# Patient Record
Sex: Male | Born: 1945
Health system: Southern US, Community
[De-identification: ages and names within clinical notes are randomized; demographics above are authoritative.]

## PROBLEM LIST (undated history)

## (undated) DIAGNOSIS — I272 Pulmonary hypertension, unspecified: Secondary | ICD-10-CM

## (undated) DIAGNOSIS — E119 Type 2 diabetes mellitus without complications: Secondary | ICD-10-CM

## (undated) DIAGNOSIS — I358 Other nonrheumatic aortic valve disorders: Secondary | ICD-10-CM

## (undated) DIAGNOSIS — I2510 Atherosclerotic heart disease of native coronary artery without angina pectoris: Secondary | ICD-10-CM

## (undated) DIAGNOSIS — I059 Rheumatic mitral valve disease, unspecified: Secondary | ICD-10-CM

## (undated) DIAGNOSIS — I071 Rheumatic tricuspid insufficiency: Secondary | ICD-10-CM

## (undated) DIAGNOSIS — N471 Phimosis: Secondary | ICD-10-CM

## (undated) DIAGNOSIS — E785 Hyperlipidemia, unspecified: Secondary | ICD-10-CM

## (undated) DIAGNOSIS — Z79899 Other long term (current) drug therapy: Secondary | ICD-10-CM

## (undated) DIAGNOSIS — I219 Acute myocardial infarction, unspecified: Secondary | ICD-10-CM

## (undated) DIAGNOSIS — K219 Gastro-esophageal reflux disease without esophagitis: Secondary | ICD-10-CM

## (undated) DIAGNOSIS — M1990 Unspecified osteoarthritis, unspecified site: Secondary | ICD-10-CM

## (undated) DIAGNOSIS — J449 Chronic obstructive pulmonary disease, unspecified: Secondary | ICD-10-CM

## (undated) DIAGNOSIS — Z8546 Personal history of malignant neoplasm of prostate: Secondary | ICD-10-CM

## (undated) DIAGNOSIS — C61 Malignant neoplasm of prostate: Secondary | ICD-10-CM

## (undated) DIAGNOSIS — F5101 Primary insomnia: Secondary | ICD-10-CM

## (undated) DIAGNOSIS — J189 Pneumonia, unspecified organism: Secondary | ICD-10-CM

## (undated) DIAGNOSIS — Z8679 Personal history of other diseases of the circulatory system: Secondary | ICD-10-CM

## (undated) DIAGNOSIS — G5600 Carpal tunnel syndrome, unspecified upper limb: Secondary | ICD-10-CM

## (undated) DIAGNOSIS — G4733 Obstructive sleep apnea (adult) (pediatric): Secondary | ICD-10-CM

## (undated) DIAGNOSIS — I82409 Acute embolism and thrombosis of unspecified deep veins of unspecified lower extremity: Secondary | ICD-10-CM

## (undated) DIAGNOSIS — Z951 Presence of aortocoronary bypass graft: Secondary | ICD-10-CM

## (undated) DIAGNOSIS — I517 Cardiomegaly: Secondary | ICD-10-CM

## (undated) DIAGNOSIS — D649 Anemia, unspecified: Secondary | ICD-10-CM

## (undated) DIAGNOSIS — E039 Hypothyroidism, unspecified: Secondary | ICD-10-CM

## (undated) DIAGNOSIS — G8929 Other chronic pain: Secondary | ICD-10-CM

## (undated) DIAGNOSIS — I10 Essential (primary) hypertension: Secondary | ICD-10-CM

## (undated) DIAGNOSIS — Z8619 Personal history of other infectious and parasitic diseases: Secondary | ICD-10-CM

## (undated) DIAGNOSIS — M25571 Pain in right ankle and joints of right foot: Secondary | ICD-10-CM

## (undated) DIAGNOSIS — I739 Peripheral vascular disease, unspecified: Secondary | ICD-10-CM

## (undated) DIAGNOSIS — E7800 Pure hypercholesterolemia, unspecified: Secondary | ICD-10-CM

## (undated) DIAGNOSIS — J810 Acute pulmonary edema: Secondary | ICD-10-CM

## (undated) DIAGNOSIS — I4891 Unspecified atrial fibrillation: Secondary | ICD-10-CM

## (undated) DIAGNOSIS — C189 Malignant neoplasm of colon, unspecified: Secondary | ICD-10-CM

## (undated) DIAGNOSIS — Z9289 Personal history of other medical treatment: Secondary | ICD-10-CM

## (undated) DIAGNOSIS — F17200 Nicotine dependence, unspecified, uncomplicated: Secondary | ICD-10-CM

## (undated) DIAGNOSIS — IMO0002 Reserved for concepts with insufficient information to code with codable children: Secondary | ICD-10-CM

## (undated) HISTORY — DX: Morbid (severe) obesity due to excess calories: E66.01

## (undated) HISTORY — DX: Obstructive sleep apnea (adult) (pediatric): G47.33

## (undated) HISTORY — DX: Pure hypercholesterolemia, unspecified: E78.00

## (undated) HISTORY — PX: PTCA: SHX146

## (undated) HISTORY — DX: Reserved for concepts with insufficient information to code with codable children: IMO0002

## (undated) HISTORY — DX: Nicotine dependence, unspecified, uncomplicated: F17.200

## (undated) HISTORY — DX: Hypothyroidism, unspecified: E03.9

## (undated) HISTORY — DX: Personal history of malignant neoplasm of prostate: Z85.46

## (undated) HISTORY — DX: Atherosclerotic heart disease of native coronary artery without angina pectoris: I25.10

## (undated) HISTORY — DX: Hyperlipidemia, unspecified: E78.5

## (undated) HISTORY — DX: Unspecified atrial fibrillation: I48.91

## (undated) HISTORY — DX: Malignant neoplasm of prostate: C61

## (undated) HISTORY — DX: Presence of aortocoronary bypass graft: Z95.1

## (undated) HISTORY — DX: Pain in right ankle and joints of right foot: M25.571

## (undated) HISTORY — DX: Primary insomnia: F51.01

## (undated) HISTORY — DX: Acute myocardial infarction, unspecified: I21.9

## (undated) HISTORY — DX: Essential (primary) hypertension: I10

## (undated) HISTORY — DX: Other long term (current) drug therapy: Z79.899

## (undated) HISTORY — DX: Other chronic pain: G89.29

## (undated) HISTORY — DX: Peripheral vascular disease, unspecified: I73.9

---
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
## 2004-12-03 DIAGNOSIS — Z951 Presence of aortocoronary bypass graft: Secondary | ICD-10-CM

## 2004-12-03 DIAGNOSIS — C189 Malignant neoplasm of colon, unspecified: Secondary | ICD-10-CM

## 2004-12-03 HISTORY — DX: Presence of aortocoronary bypass graft: Z95.1

## 2004-12-03 HISTORY — DX: Malignant neoplasm of colon, unspecified: C18.9

## 2004-12-03 HISTORY — PX: CORONARY ARTERY BYPASS GRAFT: SHX141

## 2006-12-03 DIAGNOSIS — C61 Malignant neoplasm of prostate: Secondary | ICD-10-CM

## 2006-12-03 HISTORY — DX: Malignant neoplasm of prostate: C61

## 2006-12-03 HISTORY — PX: PROSTATE SURGERY: SHX751

## 2013-12-03 HISTORY — PX: ANKLE SURGERY: SHX546

## 2015-10-24 DIAGNOSIS — S8251XA Displaced fracture of medial malleolus of right tibia, initial encounter for closed fracture: Secondary | ICD-10-CM | POA: Insufficient documentation

## 2015-10-31 ENCOUNTER — Other Ambulatory Visit: Payer: Self-pay | Admitting: Orthopaedic Surgery

## 2015-10-31 DIAGNOSIS — M79671 Pain in right foot: Secondary | ICD-10-CM

## 2015-11-07 DIAGNOSIS — M19171 Post-traumatic osteoarthritis, right ankle and foot: Secondary | ICD-10-CM | POA: Insufficient documentation

## 2015-11-08 ENCOUNTER — Other Ambulatory Visit: Payer: Self-pay

## 2016-02-29 ENCOUNTER — Telehealth: Payer: Self-pay | Admitting: Internal Medicine

## 2016-02-29 NOTE — Telephone Encounter (Signed)
Received records from [place] for appointment on 03/06/16 with Dr Debara Pickett.  Records given to [facility] (medical records) for Dr Lysbeth Penner schedule on 03/06/16. lp

## 2016-03-06 ENCOUNTER — Ambulatory Visit: Payer: Self-pay | Admitting: Internal Medicine

## 2016-03-21 DIAGNOSIS — M25571 Pain in right ankle and joints of right foot: Secondary | ICD-10-CM | POA: Insufficient documentation

## 2016-03-21 DIAGNOSIS — I471 Supraventricular tachycardia: Secondary | ICD-10-CM | POA: Insufficient documentation

## 2016-03-21 DIAGNOSIS — E7800 Pure hypercholesterolemia, unspecified: Secondary | ICD-10-CM

## 2016-03-21 DIAGNOSIS — Z8546 Personal history of malignant neoplasm of prostate: Secondary | ICD-10-CM

## 2016-03-21 DIAGNOSIS — G8929 Other chronic pain: Secondary | ICD-10-CM

## 2016-03-21 DIAGNOSIS — Z79899 Other long term (current) drug therapy: Secondary | ICD-10-CM

## 2016-03-21 DIAGNOSIS — I10 Essential (primary) hypertension: Secondary | ICD-10-CM | POA: Insufficient documentation

## 2016-03-21 DIAGNOSIS — IMO0002 Reserved for concepts with insufficient information to code with codable children: Secondary | ICD-10-CM | POA: Insufficient documentation

## 2016-03-21 DIAGNOSIS — E785 Hyperlipidemia, unspecified: Secondary | ICD-10-CM | POA: Insufficient documentation

## 2016-03-21 DIAGNOSIS — G4733 Obstructive sleep apnea (adult) (pediatric): Secondary | ICD-10-CM | POA: Insufficient documentation

## 2016-03-21 DIAGNOSIS — F17200 Nicotine dependence, unspecified, uncomplicated: Secondary | ICD-10-CM | POA: Insufficient documentation

## 2016-03-21 DIAGNOSIS — Z951 Presence of aortocoronary bypass graft: Secondary | ICD-10-CM | POA: Insufficient documentation

## 2016-03-21 DIAGNOSIS — F5101 Primary insomnia: Secondary | ICD-10-CM

## 2016-03-21 HISTORY — DX: Primary insomnia: F51.01

## 2016-03-21 HISTORY — DX: Pain in right ankle and joints of right foot: M25.571

## 2016-03-21 HISTORY — DX: Essential (primary) hypertension: I10

## 2016-03-21 HISTORY — DX: Other long term (current) drug therapy: Z79.899

## 2016-03-21 HISTORY — DX: Reserved for concepts with insufficient information to code with codable children: IMO0002

## 2016-03-21 HISTORY — DX: Obstructive sleep apnea (adult) (pediatric): G47.33

## 2016-03-21 HISTORY — DX: Pure hypercholesterolemia, unspecified: E78.00

## 2016-03-21 HISTORY — DX: Morbid (severe) obesity due to excess calories: E66.01

## 2016-03-21 HISTORY — DX: Personal history of malignant neoplasm of prostate: Z85.46

## 2016-03-21 HISTORY — DX: Other chronic pain: G89.29

## 2016-03-21 HISTORY — DX: Nicotine dependence, unspecified, uncomplicated: F17.200

## 2016-03-22 ENCOUNTER — Ambulatory Visit: Payer: Federal, State, Local not specified - PPO | Admitting: Internal Medicine

## 2016-04-13 ENCOUNTER — Ambulatory Visit (INDEPENDENT_AMBULATORY_CARE_PROVIDER_SITE_OTHER): Payer: Federal, State, Local not specified - PPO | Admitting: Internal Medicine

## 2016-04-13 ENCOUNTER — Encounter: Payer: Self-pay | Admitting: Internal Medicine

## 2016-04-13 VITALS — BP 132/64 | HR 82 | Ht 67.0 in | Wt 239.0 lb

## 2016-04-13 DIAGNOSIS — I10 Essential (primary) hypertension: Secondary | ICD-10-CM | POA: Diagnosis not present

## 2016-04-13 DIAGNOSIS — I471 Supraventricular tachycardia: Secondary | ICD-10-CM

## 2016-04-13 DIAGNOSIS — E7800 Pure hypercholesterolemia, unspecified: Secondary | ICD-10-CM

## 2016-04-13 DIAGNOSIS — Z951 Presence of aortocoronary bypass graft: Secondary | ICD-10-CM

## 2016-04-13 DIAGNOSIS — F17200 Nicotine dependence, unspecified, uncomplicated: Secondary | ICD-10-CM | POA: Diagnosis not present

## 2016-04-13 DIAGNOSIS — G4733 Obstructive sleep apnea (adult) (pediatric): Secondary | ICD-10-CM

## 2016-04-13 NOTE — Progress Notes (Signed)
OFFICE NOTE  Chief Complaint:  Establish cardiologist  Primary Care Physician: No primary care provider on file.  HPI:  Jeremiah [name] is a 70 y.o. 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 He is originally from [place] and his wife who is accompanying him today is originally from [place]. Jeremiah [name] had an MI in 2006 and underwent coronary artery bypass grafting with a LIMA to LAD, SVG to OM1, and SVG to RPDA. He did fairly well with this however 2015 was having chest discomfort. He underwent a nuclear stress test which showed possible reversible ischemia. Therefore he was referred for cardiac catheterization at [facility] [place]. That demonstrated all 3 patent bypass grafts with multivessel coronary disease. At that time he was also having episodes of palpitations and a monitor demonstrated PSVT. He was scheduled for possible ablation but is had chronic problems with an ankle fracture and nonhealing wound. He therefore never underwent ablation. He was placed on digoxin and has been on metoprolol but stopped the digoxin at some point in the past. He reports over the past 8-12 months she's had no further palpitations. He also has a history of dyslipidemia, hypothyroidism, and borderline diabetes. Recently he had a sleep study through [facility] which demonstrated severe obstructive sleep apnea and BiPAP was recommended with settings of 19/14 cm water pressure. Currently he denies any chest pain or worsening shortness of breath. His last echocardiogram from his cardiologist in [place] was a 2015 which showed an EF of 58% and no significant valvular disease.  PMHx:  Past Medical History  Diagnosis Date  . HLD (hyperlipidemia)   . Hypothyroidism   . HTN (hypertension)   . A-fib ([place])   . CAD (coronary artery disease)   . Hx of CABG 2006  . Prostate cancer ([place]) 2008  . Myocardial infarct ([place])   . Morbid obesity ([place]) 03/21/2016  . Essential  hypertension 03/21/2016  . Hypercholesteremia 03/21/2016  . Paronychia 03/21/2016  . Tobacco dependence 03/21/2016  . Pain in right ankle and joints of right foot 03/21/2016  . Chronic pain 03/21/2016  . OSA (obstructive sleep apnea) 03/21/2016  . Medication management 03/21/2016  . History of prostate cancer 03/21/2016  . Primary insomnia 03/21/2016    Past Surgical History  Procedure Laterality Date  . Coronary artery bypass graft  2006  . Ankle surgery  12/2013  . Prostate surgery  2008    FAMHx:  Family History  Problem Relation Age of Onset  . Dementia Mother   . Diabetes Sister     SOCHx:   reports that he has been smoking Cigarettes.  He has been smoking about 0.50 packs per day. He does not have any smokeless tobacco history on file. He reports that he drinks alcohol. His drug history is not on file.  ALLERGIES:  No Known Allergies  ROS: Pertinent items noted in HPI and remainder of comprehensive ROS otherwise negative.  HOME MEDS: Current Outpatient Prescriptions  Medication Sig Dispense Refill  . ASPIRIN LOW DOSE 81 MG EC tablet TK 1 T PO ONCE A DAY  9  . atorvastatin (LIPITOR) 80 MG tablet TK 1 T PO QD  2  . cephALEXin (KEFLEX) 500 MG capsule TK 1 C PO TID FOR 7 DAYS  0  . levothyroxine (SYNTHROID, LEVOTHROID) 75 MCG tablet TK 1 T PO QD ON AN EMPTY STOMACH FOR THYROID  1  . meloxicam (MOBIC) 7.5 MG tablet TK 1  T PO  BID  0  . metoprolol (LOPRESSOR) 100 MG tablet TK 1 T PO BID  3  . traZODone (DESYREL) 50 MG tablet TK 1 T PO QHS PRF SLP  1   No current facility-administered medications for this visit.    LABS/IMAGING: No results found for this or any previous visit (from the past 48 hour(s)). No results found.  WEIGHTS: Wt Readings from Last 3 Encounters:  04/13/16 239 lb (108.41 kg)    VITALS: BP 132/64 mmHg  Pulse 82  Ht 5\' 7"  (1.702 m)  Wt 239 lb (108.41 kg)  BMI 37.42 kg/m2  EXAM: General appearance: alert and no distress Neck: no carotid bruit  and no JVD Lungs: clear to auscultation bilaterally Heart: regular rate and rhythm, S1, S2 normal, no murmur, click, rub or gallop Abdomen: soft, non-tender; bowel sounds normal; no masses,  no organomegaly Extremities: extremities normal, atraumatic, no cyanosis or edema Pulses: 2+ and symmetric Skin: Skin color, texture, turgor normal. No rashes or lesions Neurologic: Grossly normal Psych: Pleasant  EKG: Sinus rhythm with PACs at 82, possible left atrial enlargement, ST and T waves are abnormal, possible inferior infarct  ASSESSMENT: 1. CAD status post three-vessel CABG in 2006 (LIMA to LAD, SVG to OM1, SVG to RPDA) in [place] 2. Patent bypass grafts by cath in 2015 3. History of PSVT-improve with digoxin, but no longer taking-on metoprolol 4. Dyslipidemia on Lipitor 5. OSA-BiPAP recommended  PLAN: 1.   Jeremiah [name] has a history of multivessel coronary disease with bypass in 2006. Fortunately he had but patent bypass grafts in 2015. He is on reasonable therapy including aspirin, statin and metoprolol. He's not currently on ACE or ARB which may be added in the future as blood pressure tolerates. I would try to avoid digoxin for his history of SVT because of the negative side effects of that medication. It also has a very narrow therapeutic window. He seems to have had no further recurrence on metoprolol. It does not seem that he would need ablation as he was previously contemplating. Some of his arrhythmias may improve with treatment of his severe obstructive sleep apnea. BiPAP has been recommended but he is not yet been fitted with equipment. He will need continuing follow-up of dyslipidemia. He is on Lipitor. Recent labs from [place] at [facility] indicated good control over her cholesterol with a total cholesterol of 146, triglycerides 304, HL 31 and LDL 55. Triglycerides as he knows is related to elevated sugars and carbohydrates in his diet which she should try to work on as well as weight  loss and more exercise. Plan to see him back in about 3 months.  Thanks for the kind referral.  Pixie Casino, MD, [facility] Attending Cardiologist [place] 04/13/2016, 6:31 PM

## 2016-04-13 NOTE — Patient Instructions (Signed)
Your physician recommends that you schedule a follow-up appointment in THREE MONTHS with Dr. Hilty.  

## 2016-04-16 ENCOUNTER — Encounter: Payer: Self-pay | Admitting: *Deleted

## 2016-04-16 ENCOUNTER — Telehealth: Payer: Self-pay | Admitting: Internal Medicine

## 2016-04-16 NOTE — Telephone Encounter (Signed)
Returned call to patient.   Patient would like [place] to go ahead and arrange his CPAP supplies/management Patient has his sleep study thru [facility]  Patient's info has been submitted by PCP to the home care agencies below: [facility] patient I am personally unsure how to process a CPAP supply referral since another MD/office ordered the study and his info has been submitted to home care/DME agencies already. Informed him I have reached out for advice on this

## 2016-04-16 NOTE — Telephone Encounter (Signed)
NEw Message  Pt requested to speak w/ RN concerning CPAP machine. Please call back and discuss.

## 2016-04-17 NOTE — Telephone Encounter (Signed)
Message routed to [place] to assist

## 2016-04-18 NOTE — Telephone Encounter (Signed)
Left message for medical assistant for the patient's PCP to return a call to me to discuss the sleep study.

## 2016-04-23 ENCOUNTER — Telehealth: Payer: Self-pay | Admitting: Cardiovascular Disease

## 2016-04-23 NOTE — Telephone Encounter (Signed)
Please call today,this concerning getting his sleep equipment.

## 2016-05-05 ENCOUNTER — Emergency Department (HOSPITAL_COMMUNITY): Payer: Federal, State, Local not specified - PPO

## 2016-05-05 ENCOUNTER — Inpatient Hospital Stay (HOSPITAL_COMMUNITY)
Admission: EM | Admit: 2016-05-05 | Discharge: 2016-05-05 | DRG: 282 | Disposition: A | Discharge status: Home / Self Care | Payer: Federal, State, Local not specified - PPO | Attending: Cardiology | Admitting: Cardiology

## 2016-05-05 ENCOUNTER — Encounter (HOSPITAL_COMMUNITY): Payer: Self-pay | Admitting: Emergency Medicine

## 2016-05-05 DIAGNOSIS — G4700 Insomnia, unspecified: Secondary | ICD-10-CM | POA: Diagnosis present

## 2016-05-05 DIAGNOSIS — I2510 Atherosclerotic heart disease of native coronary artery without angina pectoris: Secondary | ICD-10-CM | POA: Diagnosis present

## 2016-05-05 DIAGNOSIS — F17210 Nicotine dependence, cigarettes, uncomplicated: Secondary | ICD-10-CM | POA: Diagnosis present

## 2016-05-05 DIAGNOSIS — I209 Angina pectoris, unspecified: Secondary | ICD-10-CM | POA: Diagnosis not present

## 2016-05-05 DIAGNOSIS — R079 Chest pain, unspecified: Secondary | ICD-10-CM | POA: Diagnosis not present

## 2016-05-05 DIAGNOSIS — Z6837 Body mass index (BMI) 37.0-37.9, adult: Secondary | ICD-10-CM | POA: Diagnosis not present

## 2016-05-05 DIAGNOSIS — R7303 Prediabetes: Secondary | ICD-10-CM | POA: Diagnosis present

## 2016-05-05 DIAGNOSIS — I4891 Unspecified atrial fibrillation: Secondary | ICD-10-CM | POA: Diagnosis present

## 2016-05-05 DIAGNOSIS — I214 Non-ST elevation (NSTEMI) myocardial infarction: Secondary | ICD-10-CM | POA: Diagnosis not present

## 2016-05-05 DIAGNOSIS — I10 Essential (primary) hypertension: Secondary | ICD-10-CM | POA: Diagnosis present

## 2016-05-05 DIAGNOSIS — Z7982 Long term (current) use of aspirin: Secondary | ICD-10-CM

## 2016-05-05 DIAGNOSIS — Z79899 Other long term (current) drug therapy: Secondary | ICD-10-CM

## 2016-05-05 DIAGNOSIS — Z951 Presence of aortocoronary bypass graft: Secondary | ICD-10-CM | POA: Diagnosis not present

## 2016-05-05 DIAGNOSIS — G4733 Obstructive sleep apnea (adult) (pediatric): Secondary | ICD-10-CM | POA: Diagnosis present

## 2016-05-05 DIAGNOSIS — E7800 Pure hypercholesterolemia, unspecified: Secondary | ICD-10-CM | POA: Diagnosis present

## 2016-05-05 DIAGNOSIS — G8929 Other chronic pain: Secondary | ICD-10-CM | POA: Diagnosis present

## 2016-05-05 DIAGNOSIS — E785 Hyperlipidemia, unspecified: Secondary | ICD-10-CM | POA: Diagnosis present

## 2016-05-05 DIAGNOSIS — I252 Old myocardial infarction: Secondary | ICD-10-CM | POA: Diagnosis not present

## 2016-05-05 DIAGNOSIS — Z8546 Personal history of malignant neoplasm of prostate: Secondary | ICD-10-CM | POA: Diagnosis not present

## 2016-05-05 DIAGNOSIS — E039 Hypothyroidism, unspecified: Secondary | ICD-10-CM | POA: Diagnosis present

## 2016-05-05 DIAGNOSIS — F17200 Nicotine dependence, unspecified, uncomplicated: Secondary | ICD-10-CM | POA: Diagnosis present

## 2016-05-05 LAB — CBC
HCT: 44.6 % (ref 39.0–52.0)
Hemoglobin: 14.3 g/dL (ref 13.0–17.0)
MCH: 31.6 pg (ref 26.0–34.0)
MCHC: 32.1 g/dL (ref 30.0–36.0)
MCV: 98.5 fL (ref 78.0–100.0)
PLATELETS: 204 10*3/uL (ref 150–400)
RBC: 4.53 MIL/uL (ref 4.22–5.81)
RDW: 15.3 % (ref 11.5–15.5)
WBC: 6.4 10*3/uL (ref 4.0–10.5)

## 2016-05-05 LAB — BRAIN NATRIURETIC PEPTIDE: B Natriuretic Peptide: 90.3 pg/mL (ref 0.0–100.0)

## 2016-05-05 LAB — I-STAT CHEM 8, ED
BUN: 21 mg/dL — ABNORMAL HIGH (ref 6–20)
Calcium, Ion: 1.13 mmol/L (ref 1.13–1.30)
Chloride: 98 mmol/L — ABNORMAL LOW (ref 101–111)
Creatinine, Ser: 1.1 mg/dL (ref 0.61–1.24)
Glucose, Bld: 198 mg/dL — ABNORMAL HIGH (ref 65–99)
HEMATOCRIT: 47 % (ref 39.0–52.0)
HEMOGLOBIN: 16 g/dL (ref 13.0–17.0)
POTASSIUM: 3.4 mmol/L — AB (ref 3.5–5.1)
SODIUM: 138 mmol/L (ref 135–145)
TCO2: 26 mmol/L (ref 0–100)

## 2016-05-05 LAB — COMPREHENSIVE METABOLIC PANEL WITH GFR
ALT: 21 U/L (ref 17–63)
AST: 21 U/L (ref 15–41)
Albumin: 3.8 g/dL (ref 3.5–5.0)
Alkaline Phosphatase: 82 U/L (ref 38–126)
Anion gap: 13 (ref 5–15)
BUN: 19 mg/dL (ref 6–20)
CO2: 25 mmol/L (ref 22–32)
Calcium: 9.2 mg/dL (ref 8.9–10.3)
Chloride: 97 mmol/L — ABNORMAL LOW (ref 101–111)
Creatinine, Ser: 1.19 mg/dL (ref 0.61–1.24)
GFR calc Af Amer: >60 mL/min
GFR calc non Af Amer: >60 mL/min
Glucose, Bld: 201 mg/dL — ABNORMAL HIGH (ref 65–99)
Potassium: 3.5 mmol/L (ref 3.5–5.1)
Sodium: 135 mmol/L (ref 135–145)
Total Bilirubin: 0.5 mg/dL (ref 0.3–1.2)
Total Protein: 6.6 g/dL (ref 6.5–8.1)

## 2016-05-05 LAB — TROPONIN I: Troponin I: 0.12 ng/mL — ABNORMAL HIGH (ref <0.031)

## 2016-05-05 LAB — I-STAT TROPONIN, ED: TROPONIN I, POC: 0.01 ng/mL (ref 0.00–0.08)

## 2016-05-05 MED ORDER — ASPIRIN 325 MG PO TABS
325.0000 mg | ORAL_TABLET | Freq: Once | ORAL | Status: AC
  Administered 2016-05-05: 325 mg via ORAL
  Filled 2016-05-05: qty 1

## 2016-05-05 MED ORDER — HEPARIN BOLUS VIA INFUSION
4000.0000 [IU] | Freq: Once | INTRAVENOUS | Status: DC
  Filled 2016-05-05: qty 4000

## 2016-05-05 MED ORDER — CLOPIDOGREL BISULFATE 300 MG PO TABS: 600.0000 mg | ORAL_TABLET | Freq: Once | ORAL | Status: DC

## 2016-05-05 MED ORDER — ASPIRIN EC 325 MG PO TBEC
325.0000 mg | DELAYED_RELEASE_TABLET | Freq: Every day | ORAL | Status: DC
Start: 2016-05-05 — End: 2016-08-02

## 2016-05-05 MED ORDER — CLOPIDOGREL BISULFATE 75 MG PO TABS
75.0000 mg | ORAL_TABLET | Freq: Every day | ORAL | Status: DC
Start: 2016-05-06 — End: 2016-07-26

## 2016-05-05 MED ORDER — HEPARIN (PORCINE) IN NACL 100-0.45 UNIT/ML-% IJ SOLN
1200.0000 [IU]/h | INTRAMUSCULAR | Status: DC
  Filled 2016-05-05: qty 250

## 2016-05-05 NOTE — H&P (Signed)
History & Physical    Patient ID: Wwilliam [name] MRN: [id number], DOB/AGE: 03/20/1946   Admit date: 05/05/2016   Primary Physician: [name], MD Primary Cardiologist: Dr. Debara Pickett  History of Present Illness    Jeremiah [name] is a 70 y.o. male with past medical history of CAD (s/p CABG in 2006 with LIMA-LAD, SVG-OM1, and SVG-RPDA), PSVT, Dyslipidemia, hypothyroidism, prediabetes, and OSA (on BiPAP) who presents to [facility] ED on 05/05/2016 for evaluation of chest pain.  The patient reports he developed a central chest pain radiating down his left arm earlier this morning while sitting at his dining room table. Says the pain felt as if someone was repeatedly hitting his chest with a hammer. He developed associated diaphoresis along with nausea and vomiting 10 minutes later. He took a SL NTG with no relief in his symptoms. Overall, the pain lasted approximately 20 minutes. He denies any active pain at this time.  Says the pain he experienced earlier today feels identical to his symptoms in 2006 when he had an MI and required CABG. Says he use to have episodes of PSVT which would feel similar as well but he never had N/V with those episodes. Only had N/V with his prior MI.  Denies any recent chest discomfort or dyspnea with exertion over the past several weeks. Says he has been feeling well. Reports good compliance with his cardiac medications (ASA, statin, and Lopressor).  His last cardiac catheterization was in 2015 and showed all 3 grafts were patent. Has required no further ischemic evaluation since. In reviewing notes from Dr. Debara Pickett, his last echocardiogram was in 2015 and showed a preserved EF of 58%.  While in the ED, his BMET shows a creatinine of 1.19 and glucose elevated to 201. CBC with no significant abnormalities.  BNP 90.3. Initial troponin at 0.01. EKG shows NSR, HR 70, ST depression in the inferior leads and TWI in lateral leads, both present on previous tracings.  Past Medical  History    Past Medical History  Diagnosis Date  . HLD (hyperlipidemia)   . Hypothyroidism   . HTN (hypertension)   . A-fib ([place])   . CAD (coronary artery disease)   . Hx of CABG 2006  . Prostate cancer ([place]) 2008  . Myocardial infarct ([place])   . Morbid obesity ([place]) 03/21/2016  . Essential hypertension 03/21/2016  . Hypercholesteremia 03/21/2016  . Paronychia 03/21/2016  . Tobacco dependence 03/21/2016  . Pain in right ankle and joints of right foot 03/21/2016  . Chronic pain 03/21/2016  . OSA (obstructive sleep apnea) 03/21/2016  . Medication management 03/21/2016  . History of prostate cancer 03/21/2016  . Primary insomnia 03/21/2016    Past Surgical History  Procedure Laterality Date  . Coronary artery bypass graft  2006    x3  . Ankle surgery  12/2013  . Prostate surgery  2008     Allergies  No Known Allergies   Home Medications    Prior to Admission medications   Medication Sig Start Date End Date Taking? Authorizing Provider  aspirin EC 81 MG tablet Take 81 mg by mouth daily.   Yes Historical Provider, MD  atorvastatin (LIPITOR) 80 MG tablet Take 80 mg by mouth daily.   Yes Historical Provider, MD  levothyroxine (SYNTHROID, LEVOTHROID) 75 MCG tablet Take 75 mcg by mouth daily before breakfast.   Yes Historical Provider, MD  meloxicam (MOBIC) 7.5 MG tablet Take 7.5 mg by mouth daily.   Yes Historical Provider, MD  metoprolol (  LOPRESSOR) 100 MG tablet Take 100 mg by mouth 2 (two) times daily.   Yes Historical Provider, MD  traZODone (DESYREL) 50 MG tablet Take 50 mg by mouth at bedtime as needed for sleep.   Yes Historical Provider, MD    Family History    Family History  Problem Relation Age of Onset  . Dementia Mother   . Diabetes Sister     Social History    Social History   Social History  . Marital Status: Married    Spouse Name: N/A  . Number of Children: 0  . Years of Education: N/A   Occupational History  . retired - Passenger transport manager    Social  History Main Topics  . Smoking status: Current Every Day Smoker -- 0.50 packs/day    Types: Cigarettes  . Smokeless tobacco: Not on file  . Alcohol Use: Yes     Comment: 1 beer per month  . Drug Use: Not on file  . Sexual Activity: Not on file   Other Topics Concern  . Not on file   Social History Narrative   Retired, married, no children      epworth sleepiness scale = 9 (04/13/16) (has OSA)     Review of Systems    General:  No chills, fever, night sweats or weight changes.  Cardiovascular:  No dyspnea on exertion, edema, orthopnea, palpitations, paroxysmal nocturnal dyspnea. Positive for chest pain and diaphoresis.  Dermatological: No rash, lesions/masses Respiratory: No cough, dyspnea Urologic: No hematuria, dysuria Abdominal:   No diarrhea, bright red blood per rectum, melena, or hematemesis. Positive for nausea and vomiting. Neurologic:  No visual changes, wkns, changes in mental status. All other systems reviewed and are otherwise negative except as noted above.  Physical Exam    Blood pressure 96/57, pulse 60, temperature 97.8 F (36.6 C), temperature source Oral, resp. rate 16, SpO2 98 %.  General: Obese Caucasian ,male appearing in no acute distress. Head: Normocephalic, atraumatic, sclera non-icteric, no xanthomas, nares are without discharge. Dentition:  Neck: No carotid bruits. JVD not elevated.  Lungs: Respirations regular and unlabored, rhonchi present in lower lung fields. No wheezing or rales noted. Heart: Regular rate and rhythm. No S3 or S4.  No murmur, no rubs, or gallops appreciated. Abdomen: Soft, non-tender, non-distended with normoactive bowel sounds. No hepatomegaly. No rebound/guarding. No obvious abdominal masses. Msk:  Strength and tone appear normal for age. No joint deformities or effusions. Extremities: No clubbing or cyanosis. No edema.  Distal pedal pulses are 2+ bilaterally. Neuro: Alert and oriented X 3. Moves all extremities spontaneously.  No focal deficits noted. Psych:  Responds to questions appropriately with a normal affect. Skin: No rashes or lesions noted  Labs    Troponin  [facility] Test)  Recent Labs  05/05/16 0613  TROPIPOC 0.01   No results for input(s): CKTOTAL, CKMB, TROPONINI in the last 72 hours. Lab Results  Component Value Date   WBC 6.4 05/05/2016   HGB 16.0 05/05/2016   HCT 47.0 05/05/2016   MCV 98.5 05/05/2016   PLT 204 05/05/2016     Recent Labs Lab 05/05/16 0603 05/05/16 0615  NA 135 138  K 3.5 3.4*  CL 97* 98*  CO2 25  --   BUN 19 21*  CREATININE 1.19 1.10  CALCIUM 9.2  --   PROT 6.6  --   BILITOT 0.5  --   ALKPHOS 82  --   ALT 21  --   AST 21  --  GLUCOSE 201* 198*   No results found for: CHOL, HDL, LDLCALC, TRIG No results found for: DDIMER   B NATRIURETIC PEPTIDE  Date/Time Value Ref Range Status  05/05/2016 06:03 AM 90.3 0.0 - 100.0 pg/mL Final    Radiology Studies    Dg Chest 2 View: 05/05/2016  CLINICAL DATA:  Initial evaluation for acute chest pain. EXAM: CHEST  2 VIEW COMPARISON:  None. FINDINGS: Median sternotomy wires underlying surgical clips present. Mild cardiomegaly. Mediastinal silhouette within normal limits. Lungs normally inflated. No focal infiltrate, pulmonary edema, or definite pleural effusion. No pneumothorax. No acute osseous abnormality. IMPRESSION: No active cardiopulmonary disease. Electronically Signed   By: Jeannine Boga M.D.   On: 05/05/2016 06:39    EKG & Cardiac Imaging    EKG: NSR, HR 70, ST depression in the inferior leads and TWI in lateral leads, both present on previous tracings  Assessment & Plan    1. Chest Pain concerning for Unstable Angina/ History of CAD - s/p CABG in 2006 with LIMA-LAD, SVG-OM1, and SVG-RPDA. Presents with chest pain, N, V, and diaphoresis, all consistent with his previous MI.  - last cardiac catheterization was in 2015 and showed all 3 grafts were patent. Has required no further ischemic evaluation  since due to no symptoms until now. -  EKG shows ST depression in the inferior leads and TWI in lateral leads, both present on previous tracings. Initial troponin negative. Will cycle troponin values. - Discussed with Dr. Radford Pax. Will start on IV Heparin. Add to the schedule for a repeat cardiac catheterization on Monday. - continue ASA, statin, and BB.  2. History of PSVT - denies any recent occurrence. - monitor on telemetry.  3. Dyslipidemia - continue statin therapy.  4. OSA - on BiPAP  5. Tobacco Use  - smoking cessation advised.  6. Prediabetes - started on Metformin as an outpatient. Hold in anticipation of cardiac cath. - SSI while admitted. - A1c pending.   Signed, Erma Heritage, PA-C 05/05/2016, 9:05 AM Pager: [phone]

## 2016-05-05 NOTE — ED Notes (Addendum)
Patient awoke this morning with chest pain and shortness of breath around 0400, having nausea and vomited x1 after an episode of sweating.  Patient denies any CP at this time.

## 2016-05-05 NOTE — ED Provider Notes (Signed)
CSN: [id number]     Arrival date & time 05/05/16  0543 History   First MD Initiated Contact with Patient 05/05/16 [phone]     Chief Complaint  Patient presents with  . Chest Pain     (Consider location/radiation/quality/duration/timing/severity/associated sxs/prior Treatment) HPI Jeremiah [name] is a 70 y.o. male history of known coronary artery disease status post CABG 2006, Myoview 2015, here for evaluation of chest discomfort. Patient reports at approximately 4:30 AM, while reading the newspaper, he experienced throbbing chest discomfort with associated left arm pain, clamminess, shortness of breath and subsequent vomiting and symptoms lasted for approximately 30 minutes and resolved after patient took one nitroglycerin. He denies any chest discomfort now in the emergency department. He takes 81 mg aspirin daily. He has not had his BiPAP as previously recommended, states he has been waiting 6 weeks for delivery.  Past Medical History  Diagnosis Date  . HLD (hyperlipidemia)   . Hypothyroidism   . HTN (hypertension)   . A-fib ([place])   . CAD (coronary artery disease)     a. 2006: CABG in 2006 with LIMA-LAD, SVG-OM1, and SVG-RPDA  . Hx of CABG 2006  . Prostate cancer ([place]) 2008  . Myocardial infarct ([place])   . Morbid obesity ([place]) 03/21/2016  . Essential hypertension 03/21/2016  . Hypercholesteremia 03/21/2016  . Paronychia 03/21/2016  . Tobacco dependence 03/21/2016  . Pain in right ankle and joints of right foot 03/21/2016  . Chronic pain 03/21/2016  . OSA (obstructive sleep apnea) 03/21/2016  . Medication management 03/21/2016  . History of prostate cancer 03/21/2016  . Primary insomnia 03/21/2016   Past Surgical History  Procedure Laterality Date  . Coronary artery bypass graft  2006    x3  . Ankle surgery  12/2013  . Prostate surgery  2008   Family History  Problem Relation Age of Onset  . Dementia Mother   . Diabetes Sister    Social History  Substance Use Topics  . Smoking status:  Current Every Day Smoker -- 0.50 packs/day    Types: Cigarettes  . Smokeless tobacco: None  . Alcohol Use: Yes     Comment: 1 beer per month    Review of Systems A 10 point review of systems was completed and was negative except for pertinent positives and negatives as mentioned in the history of present illness     Allergies  Review of patient's allergies indicates no known allergies.  Home Medications   Prior to Admission medications   Medication Sig Start Date End Date Taking? Authorizing Provider  aspirin EC 81 MG tablet Take 81 mg by mouth daily.   Yes Historical Provider, MD  atorvastatin (LIPITOR) 80 MG tablet Take 80 mg by mouth daily.   Yes Historical Provider, MD  levothyroxine (SYNTHROID, LEVOTHROID) 75 MCG tablet Take 75 mcg by mouth daily before breakfast.   Yes Historical Provider, MD  meloxicam (MOBIC) 7.5 MG tablet Take 7.5 mg by mouth daily.   Yes Historical Provider, MD  metoprolol (LOPRESSOR) 100 MG tablet Take 100 mg by mouth 2 (two) times daily.   Yes Historical Provider, MD  traZODone (DESYREL) 50 MG tablet Take 50 mg by mouth at bedtime as needed for sleep.   Yes Historical Provider, MD   BP 96/57 mmHg  Pulse 60  Temp(Src) 97.8 F (36.6 C) (Oral)  Resp 16  SpO2 98% Physical Exam  Constitutional: He is oriented to person, place, and time. He appears well-developed and well-nourished.  HENT:  Head: Normocephalic  and atraumatic.  Mouth/Throat: Oropharynx is clear and moist.  Eyes: Conjunctivae are normal. Pupils are equal, round, and reactive to light. Right eye exhibits no discharge. Left eye exhibits no discharge. No scleral icterus.  Neck: Neck supple.  Cardiovascular: Normal rate, regular rhythm and normal heart sounds.   Pulmonary/Chest: Effort normal and breath sounds normal. No respiratory distress. He has no wheezes. He has no rales.  Abdominal: Soft. There is no tenderness.  Musculoskeletal: He exhibits no tenderness.  Neurological: He is alert  and oriented to person, place, and time.  Cranial Nerves II-XII grossly intact  Skin: Skin is warm and dry. No rash noted.  Psychiatric: He has a normal mood and affect.  Nursing note and vitals reviewed.   ED Course  Procedures (including critical care time) Labs Review Labs Reviewed  COMPREHENSIVE METABOLIC PANEL - Abnormal; Notable for the following:    Chloride 97 (*)    Glucose, Bld 201 (*)    All other components within normal limits  TROPONIN I - Abnormal; Notable for the following:    Troponin I 0.12 (*)    All other components within normal limits  I-STAT CHEM 8, ED - Abnormal; Notable for the following:    Potassium 3.4 (*)    Chloride 98 (*)    BUN 21 (*)    Glucose, Bld 198 (*)    All other components within normal limits  CBC  BRAIN NATRIURETIC PEPTIDE  I-STAT TROPOININ, ED    Imaging Review Dg Chest 2 View  05/05/2016  CLINICAL DATA:  Initial evaluation for acute chest pain. EXAM: CHEST  2 VIEW COMPARISON:  None. FINDINGS: Median sternotomy wires underlying surgical clips present. Mild cardiomegaly. Mediastinal silhouette within normal limits. Lungs normally inflated. No focal infiltrate, pulmonary edema, or definite pleural effusion. No pneumothorax. No acute osseous abnormality. IMPRESSION: No active cardiopulmonary disease. Electronically Signed   By: Jeannine Boga M.D.   On: 05/05/2016 06:39   I have personally reviewed and evaluated these images and lab results as part of my medical decision-making.   EKG Interpretation   Date/Time:  Saturday May 05 2016 05:51:07 EDT Ventricular Rate:  70 PR Interval:  158 QRS Duration: 98 QT Interval:  422 QTC Calculation: 455 R Axis:   45 Text Interpretation:  Normal sinus rhythm Cannot rule out Inferior infarct  , age undetermined ST \\T \ T wave abnormality, consider lateral ischemia  Abnormal ECG Confirmed by Alvino Chapel  MD, Ovid Curd [id number]) on 05/05/2016  6:13:31 AM      MDM  Patient with history of known  coronary artery disease. Presents to the emergency department today for chest discomfort concerning for unstable angina. Given 325 mg aspirin. Patient denies any discomfort now. He is hemodynamically stable in emergency department, most recent blood pressure 96/57. He denies any chest pain or other symptoms. Screening labs are unremarkable, initial troponin negative, EKG essentially unchanged, chest x-ray unremarkable. Discussed with my attending, Dr. Regenia Skeeter. Plan to consult cardiology. Discussed with Dr. Radford Pax, will evaluate in emergency department. Anticipate admission to cardiology service. Final diagnoses:  Chest pain, unspecified chest pain type        Comer Locket, PA-C 05/05/16 Frank, PA-C 05/05/16 1114  Sherwood Gambler, MD 05/05/16 1556

## 2016-05-05 NOTE — Progress Notes (Signed)
  Received a page from the ED nurse the patient's second troponin came back elevated at 0.12 and he is refusing admission to the hospital.   I had an in-depth conversation with the patient and his wife about the risks of leaving the hospital in the setting of an MI. I told him risks included fatal arrhythmias including death and he is aware of this. I reviewed the importance of Heparin in stabilization of his coronary arteries until the timing of cath on Monday but he does not want to stay for this.  He is aware if he leaves the ED, he is leaving [place].  Dr. Radford Pax was made aware of his decision. Will give 600mg  loading dose of Plavix while in the ED and 75 mg daily at time of discharge until his catheterization next week.  I will staff message our cardiac catheterization lab and arrange for outpatient cardiac cath early next week.   Signed, Erma Heritage, PA-C 05/05/2016, 10:44 AM Pager: [phone]

## 2016-05-05 NOTE — ED Notes (Signed)
Cardiology at bedside.

## 2016-05-05 NOTE — Discharge Instructions (Signed)
You are leaving [facility]. Your workup shows that you are having a heart attack. It was recommended you stay in the hospital for IV blood thinners and a catheterization. You have decided to leave. If it any point you change your mind or have worse or new symptoms then come back to the emergency department immediately. We have discussed that leaving could put you at risk for another heart attack, more hard damage or worse heart attack, or death. The cardiology office will call you Monday or your catheterization time.

## 2016-05-05 NOTE — ED Provider Notes (Signed)
Medical screening examination/treatment/procedure(s) were conducted as a shared visit with non-physician practitioner(s) and myself.  I personally evaluated the patient during the encounter.   EKG Interpretation   Date/Time:  Saturday May 05 2016 05:51:07 EDT Ventricular Rate:  70 PR Interval:  158 QRS Duration: 98 QT Interval:  422 QTC Calculation: 455 R Axis:   45 Text Interpretation:  Normal sinus rhythm Cannot rule out Inferior infarct  , age undetermined ST \\T \ T wave abnormality, consider lateral ischemia  Abnormal ECG Confirmed by Alvino Chapel  MD, Ovid Curd [phone]) on 05/05/2016  6:13:31 AM Also confirmed by Alvino Chapel  MD, NATHAN [phone]), editor Rolla Plate,  Joelene Millin [phone])  on 05/05/2016 10:55:35 AM        Cardiology was ready to admit the patient and placed on IV heparin. His second troponin is mildly elevated. Story is concerning for an NSTEMI. Patient is currently pain-free. Given that today is Saturday and he is pain-free and stable, he does not meet indications for a cath until Monday. Patient once to leave because he wants the cath today. I have discussed that leaving against advice could cause worse heart attack, worse damage, or death. He understands these. He is capable of making medical decisions and is awake and alert and oriented. Cardiology has also talked to patient but he still wants to leave. They recommend giving 600 mg oral Plavix now, will increase baby aspirin to 325 and started on 75 mg Plavix until his cath. Discussed that he can return at any time and is encouraged to return if any symptoms return or worsen.  Sherwood Gambler, MD 05/05/16 [phone]

## 2016-05-05 NOTE — ED Notes (Signed)
Cardiology PA in speaking with pt.

## 2016-05-05 NOTE — Progress Notes (Signed)
ANTICOAGULATION CONSULT NOTE - Initial Consult  Pharmacy Consult for heparin Indication: chest pain/ACS  No Known Allergies  Patient Measurements: TBW was 108 kg in May 2017 IBW 66.1 kg Heparin dosing weight: 90 kg  Vital Signs: Temp: 97.8 F (36.6 C) (06/03 0553) Temp Source: Oral (06/03 0553) BP: 96/57 mmHg (06/03 0630) Pulse Rate: 60 (06/03 0615)  Labs:  Recent Labs  05/05/16 0603 05/05/16 0615 05/05/16 0856  HGB 14.3 16.0  --   HCT 44.6 47.0  --   PLT 204  --   --   CREATININE 1.19 1.10  --   TROPONINI  --   --  0.12*    CrCl cannot be calculated (Unknown ideal weight.).   Medical History: Past Medical History  Diagnosis Date  . HLD (hyperlipidemia)   . Hypothyroidism   . HTN (hypertension)   . A-fib ([place])   . CAD (coronary artery disease)     a. 2006: CABG in 2006 with LIMA-LAD, SVG-OM1, and SVG-RPDA  . Hx of CABG 2006  . Prostate cancer ([place]) 2008  . Myocardial infarct ([place])   . Morbid obesity ([place]) 03/21/2016  . Essential hypertension 03/21/2016  . Hypercholesteremia 03/21/2016  . Paronychia 03/21/2016  . Tobacco dependence 03/21/2016  . Pain in right ankle and joints of right foot 03/21/2016  . Chronic pain 03/21/2016  . OSA (obstructive sleep apnea) 03/21/2016  . Medication management 03/21/2016  . History of prostate cancer 03/21/2016  . Primary insomnia 03/21/2016    Assessment: 70 yo M presents on 6/3 with CP. Concerning for unstable angina and since has a history of CAD will start heparin gtt. No anticoag PTA. Scheduled for cath on 6/5. Hgb and plts stable. No s/s of bleed.  Goal of Therapy:  Heparin level 0.3-0.7 units/ml Monitor platelets by anticoagulation protocol: Yes   Plan:  Give 4,000 unit heparin bolus Start heparin gtt at 1,200 units/hr Check 6 hr HL Monitor daily HL, CBC, s/s of bleed   Elenor Quinones, PharmD, [facility] Clinical Pharmacist Pager [phone] 05/05/2016 10:27 AM

## 2016-05-05 NOTE — ED Notes (Addendum)
2nd troponin elevated.  Pt states he wants to go home even though he is aware of elevated troponin and O2 sats being in low 90's on 3L (pt does not have home O2).  Cardiology notified and stated they will come see pt.  Pt is angry b/c cath lab is not being called in for his case b/c it is the weekend.

## 2016-05-07 ENCOUNTER — Telehealth: Payer: Self-pay | Admitting: Internal Medicine

## 2016-05-07 NOTE — Telephone Encounter (Signed)
Jeremiah [name] is calling in regards to a procedure he is having tomorrow (catherization).. Please call   Thanks

## 2016-05-07 NOTE — Telephone Encounter (Signed)
Patient wanted to know if Dr Debara Pickett was aware of recent ER visit this past weekend ( 05/05/16) , and that patient is scheduled for catheterization 05/08/16 RN informed patient that Dr Debara Pickett is not in the office today. That this message will be forward to him , but he may not see it until after patient's procedure. Patient wanted to know if the information will get to Dr Debara Pickett. RN informed patient , the physician will route information to Dr Debara Pickett.  RN instructed patient to follow instruction in regards to procedure schedule 05/08/16- take plavix as prescribed.

## 2016-05-08 ENCOUNTER — Encounter (HOSPITAL_COMMUNITY)
Admission: RE | Disposition: A | Discharge status: Home / Self Care | Payer: Self-pay | Source: Ambulatory Visit | Attending: Interventional Cardiology

## 2016-05-08 ENCOUNTER — Ambulatory Visit (HOSPITAL_COMMUNITY)
Admission: RE | Admit: 2016-05-08 | Discharge: 2016-05-08 | Disposition: A | Discharge status: Home / Self Care | Payer: Federal, State, Local not specified - PPO | Source: Ambulatory Visit | Attending: Interventional Cardiology | Admitting: Interventional Cardiology

## 2016-05-08 DIAGNOSIS — I214 Non-ST elevation (NSTEMI) myocardial infarction: Secondary | ICD-10-CM | POA: Diagnosis present

## 2016-05-08 DIAGNOSIS — I10 Essential (primary) hypertension: Secondary | ICD-10-CM | POA: Diagnosis present

## 2016-05-08 DIAGNOSIS — I471 Supraventricular tachycardia: Secondary | ICD-10-CM | POA: Diagnosis present

## 2016-05-08 DIAGNOSIS — I2582 Chronic total occlusion of coronary artery: Secondary | ICD-10-CM | POA: Insufficient documentation

## 2016-05-08 DIAGNOSIS — Z951 Presence of aortocoronary bypass graft: Secondary | ICD-10-CM | POA: Diagnosis not present

## 2016-05-08 DIAGNOSIS — I2510 Atherosclerotic heart disease of native coronary artery without angina pectoris: Secondary | ICD-10-CM

## 2016-05-08 HISTORY — PX: CARDIAC CATHETERIZATION: SHX172

## 2016-05-08 LAB — GLUCOSE, CAPILLARY
GLUCOSE-CAPILLARY: 160 mg/dL — AB (ref 65–99)
GLUCOSE-CAPILLARY: 115 mg/dL — AB (ref 65–99)

## 2016-05-08 LAB — PROTIME-INR
INR: 1 (ref 0.00–1.49)
PROTHROMBIN TIME: 13.4 s (ref 11.6–15.2)

## 2016-05-08 LAB — POTASSIUM: Potassium: 4.1 mmol/L (ref 3.5–5.1)

## 2016-05-08 SURGERY — LEFT HEART CATH AND CORS/GRAFTS ANGIOGRAPHY
Anesthesia: LOCAL

## 2016-05-08 MED ORDER — MIDAZOLAM HCL 2 MG/2ML IJ SOLN
INTRAMUSCULAR | Status: AC
  Filled 2016-05-08: qty 2

## 2016-05-08 MED ORDER — SODIUM CHLORIDE 0.9 % WEIGHT BASED INFUSION
3.0000 mL/kg/h | INTRAVENOUS | Status: DC
  Administered 2016-05-08: 3 mL/kg/h via INTRAVENOUS

## 2016-05-08 MED ORDER — LIDOCAINE HCL (PF) 1 % IJ SOLN
INTRAMUSCULAR | Status: DC | PRN
  Administered 2016-05-08: 15 mL

## 2016-05-08 MED ORDER — SODIUM CHLORIDE 0.9% FLUSH: 3.0000 mL | Freq: Two times a day (BID) | INTRAVENOUS | Status: DC

## 2016-05-08 MED ORDER — SODIUM CHLORIDE 0.9 % WEIGHT BASED INFUSION: 1.0000 mL/kg/h | INTRAVENOUS | Status: DC

## 2016-05-08 MED ORDER — SODIUM CHLORIDE 0.9 % IV SOLN: 250.0000 mL | INTRAVENOUS | Status: DC | PRN

## 2016-05-08 MED ORDER — IOPAMIDOL (ISOVUE-370) INJECTION 76%
INTRAVENOUS | Status: AC
  Filled 2016-05-08: qty 125

## 2016-05-08 MED ORDER — IOPAMIDOL (ISOVUE-370) INJECTION 76%
INTRAVENOUS | Status: DC | PRN
  Administered 2016-05-08: 165 mL via INTRA_ARTERIAL

## 2016-05-08 MED ORDER — SODIUM CHLORIDE 0.9% FLUSH: 3.0000 mL | INTRAVENOUS | Status: DC | PRN

## 2016-05-08 MED ORDER — OXYCODONE-ACETAMINOPHEN 5-325 MG PO TABS: 1.0000 | ORAL_TABLET | ORAL | Status: DC | PRN

## 2016-05-08 MED ORDER — HEPARIN (PORCINE) IN NACL 2-0.9 UNIT/ML-% IJ SOLN
INTRAMUSCULAR | Status: DC | PRN
  Administered 2016-05-08: 1500 mL

## 2016-05-08 MED ORDER — ASPIRIN 81 MG PO CHEW: 81.0000 mg | CHEWABLE_TABLET | ORAL | Status: DC

## 2016-05-08 MED ORDER — LIDOCAINE HCL (PF) 1 % IJ SOLN
INTRAMUSCULAR | Status: AC
  Filled 2016-05-08: qty 30

## 2016-05-08 MED ORDER — FENTANYL CITRATE (PF) 100 MCG/2ML IJ SOLN
INTRAMUSCULAR | Status: AC
  Filled 2016-05-08: qty 2

## 2016-05-08 MED ORDER — MIDAZOLAM HCL 2 MG/2ML IJ SOLN
INTRAMUSCULAR | Status: DC | PRN
  Administered 2016-05-08: 1 mg via INTRAVENOUS

## 2016-05-08 MED ORDER — HEPARIN (PORCINE) IN NACL 2-0.9 UNIT/ML-% IJ SOLN
INTRAMUSCULAR | Status: AC
  Filled 2016-05-08: qty 1000

## 2016-05-08 MED ORDER — FENTANYL CITRATE (PF) 100 MCG/2ML IJ SOLN
INTRAMUSCULAR | Status: DC | PRN
  Administered 2016-05-08: 50 ug via INTRAVENOUS

## 2016-05-08 MED ORDER — IOPAMIDOL (ISOVUE-370) INJECTION 76%
INTRAVENOUS | Status: AC
  Filled 2016-05-08: qty 50

## 2016-05-08 SURGICAL SUPPLY — 13 items
CATH INFINITI 5 FR IM (CATHETERS) ×2
CATH INFINITI 5 FR LCB (CATHETERS) ×2
CATH INFINITI 5FR JL4 (CATHETERS) ×2
CATH INFINITI JR4 5F (CATHETERS) ×2
CATH SITESEER 5F MULTI A 2 (CATHETERS) ×2
KIT HEART LEFT (KITS) ×2
PACK CARDIAC CATHETERIZATION (CUSTOM PROCEDURE TRAY) ×2
SHEATH PINNACLE 5F 10CM (SHEATH) ×2
TRANSDUCER W/STOPCOCK (MISCELLANEOUS) ×2
TUBING CIL FLEX 10 FLL-RA (TUBING) ×2
WIRE EMERALD 3MM-J .035X150CM (WIRE) ×2
WIRE EMERALD 3MM-J .035X260CM (WIRE) ×2
WIRE HI TORQ VERSACORE-J 145CM (WIRE) ×2

## 2016-05-08 NOTE — Progress Notes (Signed)
Patients 5 fr sheath was removed from the right femoral artery @ 1129. Patients bedrest began at that time for six hours. Patients VS remained WNL during the sheath pull. Patients right groin area has no bleeding or hematoma and the vascular access site level is a 0. Report will be given to short stay RN upon arrival.

## 2016-05-08 NOTE — H&P (View-Only) (Signed)
  Received a page from the ED nurse the patient's second troponin came back elevated at 0.12 and he is refusing admission to the hospital.   I had an in-depth conversation with the patient and his wife about the risks of leaving the hospital in the setting of an MI. I told him risks included fatal arrhythmias including death and he is aware of this. I reviewed the importance of Heparin in stabilization of his coronary arteries until the timing of cath on Monday but he does not want to stay for this.  He is aware if he leaves the ED, he is leaving [facility].  Dr. Radford Pax was made aware of his decision. Will give 600mg  loading dose of Plavix while in the ED and 75 mg daily at time of discharge until his catheterization next week.  I will staff message our cardiac catheterization lab and arrange for outpatient cardiac cath early next week.   Signed, Erma Heritage, PA-C 05/05/2016, 10:44 AM Pager: [phone]

## 2016-05-08 NOTE — Interval H&P Note (Signed)
Cath Lab Visit (complete for each Cath Lab visit)  Clinical Evaluation Leading to the Procedure:   ACS: Yes.    Non-ACS:    Anginal Classification: CCS III  Anti-ischemic medical therapy: Minimal Therapy (1 class of medications)  Non-Invasive Test Results: No non-invasive testing performed  Prior CABG: Previous CABG      History and Physical Interval Note:  05/08/2016 9:43 AM  Jeremiah [name]  has presented today for surgery, with the diagnosis of NSTEMI  The various methods of treatment have been discussed with the patient and family. After consideration of risks, benefits and other options for treatment, the patient has consented to  Procedure(s): Left Heart Cath and Cors/Grafts Angiography (N/A) as a surgical intervention .  The patient's history has been reviewed, patient examined, no change in status, stable for surgery.  I have reviewed the patient's chart and labs.  Questions were answered to the patient's satisfaction.     Belva Crome III

## 2016-05-08 NOTE — Discharge Instructions (Signed)
Do not take metformin until Friday   Angiogram, Care After Refer to this sheet in the next few weeks. These instructions provide you with information about caring for yourself after your procedure. Your health care provider may also give you more specific instructions. Your treatment has been planned according to current medical practices, but problems sometimes occur. Call your health care provider if you have any problems or questions after your procedure. WHAT TO EXPECT AFTER THE PROCEDURE After your procedure, it is typical to have the following:  Bruising at the catheter insertion site that usually fades within 1-2 weeks.  Blood collecting in the tissue (hematoma) that may be painful to the touch. It should usually decrease in size and tenderness within 1-2 weeks. HOME CARE INSTRUCTIONS  Take medicines only as directed by your health care provider.  You may shower 24-48 hours after the procedure or as directed by your health care provider. Remove the bandage (dressing) and gently wash the site with plain soap and water. Pat the area dry with a clean towel. Do not rub the site, because this may cause bleeding.  Do not take baths, swim, or use a hot tub until your health care provider approves.  Check your insertion site every day for redness, swelling, or drainage.  Do not apply powder or lotion to the site.  Do not lift over 10 lb (4.5 kg) for 5 days after your procedure or as directed by your health care provider.  Ask your health care provider when it is okay to:  Return to work or school.  Resume usual physical activities or sports.  Resume sexual activity.  Do not drive home if you are discharged the same day as the procedure. Have someone else drive you.  You may drive 24 hours after the procedure unless otherwise instructed by your health care provider.  Do not operate machinery or power tools for 24 hours after the procedure or as directed by your health care  provider.  If your procedure was done as an outpatient procedure, which means that you went home the same day as your procedure, a responsible adult should be with you for the first 24 hours after you arrive home.  Keep all follow-up visits as directed by your health care provider. This is important. SEEK MEDICAL CARE IF:  You have a fever.  You have chills.  You have increased bleeding from the catheter insertion site. Hold pressure on the site. SEEK IMMEDIATE MEDICAL CARE IF:  You have unusual pain at the catheter insertion site.  You have redness, warmth, or swelling at the catheter insertion site.  You have drainage (other than a small amount of blood on the dressing) from the catheter insertion site.  The catheter insertion site is bleeding, and the bleeding does not stop after 30 minutes of holding steady pressure on the site.  The area near or just beyond the catheter insertion site becomes pale, cool, tingly, or numb.   This information is not intended to replace advice given to you by your health care provider. Make sure you discuss any questions you have with your health care provider.   Document Released: 06/07/2005 Document Revised: 12/10/2014 Document Reviewed: 04/22/2013 Elsevier Interactive Patient Education [employer].

## 2016-05-09 ENCOUNTER — Encounter (HOSPITAL_COMMUNITY): Payer: Self-pay | Admitting: Interventional Cardiology

## 2016-07-20 ENCOUNTER — Ambulatory Visit: Payer: Federal, State, Local not specified - PPO | Admitting: Internal Medicine

## 2016-07-26 ENCOUNTER — Encounter: Payer: Self-pay | Admitting: Internal Medicine

## 2016-07-26 ENCOUNTER — Ambulatory Visit (INDEPENDENT_AMBULATORY_CARE_PROVIDER_SITE_OTHER): Payer: Federal, State, Local not specified - PPO | Admitting: Internal Medicine

## 2016-07-26 VITALS — BP 132/76 | HR 61 | Ht 67.0 in | Wt 246.0 lb

## 2016-07-26 DIAGNOSIS — I471 Supraventricular tachycardia, unspecified: Secondary | ICD-10-CM

## 2016-07-26 DIAGNOSIS — Z951 Presence of aortocoronary bypass graft: Secondary | ICD-10-CM | POA: Diagnosis not present

## 2016-07-26 DIAGNOSIS — G4733 Obstructive sleep apnea (adult) (pediatric): Secondary | ICD-10-CM | POA: Diagnosis not present

## 2016-07-26 NOTE — Progress Notes (Signed)
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 He is originally from [place] and his wife who is accompanying him today is originally from [place]. Mr. Brees had an MI in 2006 and underwent coronary artery bypass grafting with a LIMA to LAD, SVG to OM1, and SVG to RPDA. He did fairly well with this however 2015 was having chest discomfort. He underwent a nuclear stress test which showed possible reversible ischemia. Therefore he was referred for cardiac catheterization at [facility] [place]. That demonstrated all 3 patent bypass grafts with multivessel coronary disease. At that time he was also having episodes of palpitations and a monitor demonstrated PSVT. He was scheduled for possible ablation but is had chronic problems with an ankle fracture and nonhealing wound. He therefore never underwent ablation. He was placed on digoxin and has been on metoprolol but stopped the digoxin at some point in the past. He reports over the past 8-12 months she's had no further palpitations. He also has a history of dyslipidemia, hypothyroidism, and borderline diabetes. Recently he had a sleep study through [facility] which demonstrated severe obstructive sleep apnea and BiPAP was recommended with settings of 19/14 cm water pressure. Currently he denies any chest pain or worsening shortness of breath. His last echocardiogram from his cardiologist in [place] was a 2015 which showed an EF of 58% and no significant valvular disease.  07/26/2016  Mr. Zazueta returns today for follow-up. He was recently seen in the emergency department in the beginning of June for chest pain. This was associated with tachycardia and probable SVT. Troponin was noted to be elevated to 0.12. He had signs and symptoms concerning for unstable angina. He was evaluated by my  partners and felt to need cardiac catheterization. Eventually he underwent cardiac catheterization by Dr. Tamala Julian with the results as follows:  Conclusion   1. LM lesion, 85% stenosed. 2. Ost 1st Mrg lesion, 100% stenosed. 3. SVG was injected . 4. Ost RCA to Prox RCA lesion, 100% stenosed. 5. SVG . 6. Origin to Prox Graft lesion, 40% stenosed. 7. Mid RCA to Dist RCA lesion, 100% stenosed. 8. Prox LAD lesion, 100% stenosed. 9. LIMA .    Patent saphenous vein grafts. SVG to PDA contains eccentric 40% proximal stenosis. SVG to the circumflex is widely patent.  LIMA to the LAD is widely patent.  Native distal left main contains 75% stenosis. The dominant obtuse marginal branch is totally occluded. The native right coronary is totally occluded.  Left ventriculogram demonstrates inferior wall hypokinesis. EF 50%.  Recent chest pain in the setting of PSVT with minimal enzyme elevation likely related to demand ischemia in the setting of underlying native coronary disease.  Recommendations:  Medical therapy.  Management of PSVT with medications versus ablation.   Since discharge she denies any further episodes of tachycardia palpitations. He is already on a high dose of beta blocker. He's had a small amount of weight gain but has stopped smoking.  He plans to start to do more exercise and work on weight loss. I'm concerned about his recurrent palpitations and the fact that asymptomatic and has demand ischemia related to them and bypass graft insufficiency.  PMHx:  Past Medical History:  Diagnosis Date  . A-fib ([place])   . CAD (coronary artery disease)    a. 2006: CABG in  2006 with LIMA-LAD, SVG-OM1, and SVG-RPDA  . Chronic pain 03/21/2016  . Essential hypertension 03/21/2016  . History of prostate cancer 03/21/2016  . HLD (hyperlipidemia)   . HTN (hypertension)   . Hx of CABG 2006  . Hypercholesteremia 03/21/2016  . Hypothyroidism   . Medication management 03/21/2016  . Morbid obesity  ([place]) 03/21/2016  . Myocardial infarct ([place])   . OSA (obstructive sleep apnea) 03/21/2016  . Pain in right ankle and joints of right foot 03/21/2016  . Paronychia 03/21/2016  . Primary insomnia 03/21/2016  . Prostate cancer ([place]) 2008  . Tobacco dependence 03/21/2016    Past Surgical History:  Procedure Laterality Date  . ANKLE SURGERY  12/2013  . CARDIAC CATHETERIZATION N/A 05/08/2016   Procedure: Left Heart Cath and Cors/Grafts Angiography;  Surgeon: Belva Crome, MD;  Location: [facility];  Service: Cardiovascular;  Laterality: N/A;  . CORONARY ARTERY BYPASS GRAFT  2006   x3  . PROSTATE SURGERY  2008    FAMHx:  Family History  Problem Relation Age of Onset  . Dementia Mother   . Diabetes Sister     SOCHx:   reports that he quit smoking about 2 months ago. His smoking use included Cigarettes. He smoked 0.50 packs per day. He has never used smokeless tobacco. He reports that he drinks alcohol. His drug history is not on file.  ALLERGIES:  Allergies  Allergen Reactions  . Scallops [Shellfish Allergy]     Rash; shortness of breath    ROS: Pertinent items noted in HPI and remainder of comprehensive ROS otherwise negative.  HOME MEDS: Current Outpatient Prescriptions  Medication Sig Dispense Refill  . aspirin EC 325 MG tablet Take 1 tablet (325 mg total) by mouth daily. 30 tablet 0  . atorvastatin (LIPITOR) 80 MG tablet Take 80 mg by mouth daily.    Marland Kitchen levothyroxine (SYNTHROID, LEVOTHROID) 75 MCG tablet Take 75 mcg by mouth daily before breakfast.    . metFORMIN (GLUCOPHAGE-XR) 750 MG 24 hr tablet Take 750 mg by mouth every evening.  2  . metoprolol (LOPRESSOR) 100 MG tablet Take 100 mg by mouth 2 (two) times daily.     No current facility-administered medications for this visit.     LABS/IMAGING: No results found for this or any previous visit (from the past 48 hour(s)). No results found.  WEIGHTS: Wt Readings from Last 3 Encounters:  07/26/16 246 lb (111.6 kg)   05/08/16 230 lb (104.3 kg)  04/13/16 239 lb (108.4 kg)    VITALS: BP 132/76 (BP Location: Left Arm, Patient Position: Sitting, Cuff Size: Normal)   Pulse 61   Ht 5\' 7"  (1.702 m)   Wt 246 lb (111.6 kg)   BMI 38.53 kg/m   EXAM: General appearance: alert, no distress and moderately obese Neck: no carotid bruit and no JVD Lungs: clear to auscultation bilaterally Heart: regular rate and rhythm, S1, S2 normal, no murmur, click, rub or gallop Abdomen: soft, non-tender; bowel sounds normal; no masses,  no organomegaly Extremities: extremities normal, atraumatic, no cyanosis or edema Pulses: 2+ and symmetric Skin: Skin color, texture, turgor normal. No rashes or lesions Neurologic: Grossly normal Psych: Pleasant  EKG: Normal sinus rhythm at 61, lateral T-wave changes which are stable  ASSESSMENT:  1. Recent SVT which was symptomatic-repeat cardiac catheterization (05/2016) shows patent grafts 2. CAD status post three-vessel CABG in 2006 (LIMA to LAD, SVG to OM1, SVG to RPDA) in [place] 3. Patent bypass grafts by cath in  2015 4. History of PSVT-on metoprolol 5. Dyslipidemia on Lipitor 6. OSA-BiPAP recommended  PLAN: 1.   Mr. Chaffer was recently hospitalized for chest pain in the setting of SVT. His troponin was elevated and Cardec catheterization revealed no new stenoses. This is likely demand ischemia which was rate related however he was very symptomatic. He is ready on high-dose metoprolol. We discussed options for management including possible antiarrhythmic therapy or evaluation for ablation. He's not interested in additional medications and I think would be a good candidate for ablation if his SVT can be induced. I like to refer him to EP for evaluation of this. I've encouraged him to work on exercise for weight loss and continue to use his BiPAP. He reports problems with sleep and may need medication or something to help him get more than 3 hours of sleep at night. I'll defer  this to his primary care provider.  Follow-up with me in 6 months.  Pixie Casino, MD, [facility] Attending Cardiologist [place] Clearnce Leja 07/26/2016, 9:02 AM

## 2016-07-26 NOTE — Patient Instructions (Signed)
Referreal  You have been referred to Allegra Lai, MD.   Follow-Up  Your physician wants you to follow-up in: 6 months with Dr. Debara Pickett. You will receive a reminder letter in the mail two months in advance. If you don't receive a letter, please call our office to schedule the follow-up appointment.  If you need a refill on your cardiac medications before your next appointment, please call your pharmacy.

## 2016-08-02 ENCOUNTER — Encounter: Payer: Self-pay | Admitting: Cardiology

## 2016-08-02 ENCOUNTER — Ambulatory Visit (INDEPENDENT_AMBULATORY_CARE_PROVIDER_SITE_OTHER): Payer: Federal, State, Local not specified - PPO | Admitting: Cardiology

## 2016-08-02 ENCOUNTER — Encounter (INDEPENDENT_AMBULATORY_CARE_PROVIDER_SITE_OTHER): Payer: Self-pay

## 2016-08-02 VITALS — BP 140/78 | HR 60 | Ht 67.0 in | Wt 246.4 lb

## 2016-08-02 DIAGNOSIS — I471 Supraventricular tachycardia: Secondary | ICD-10-CM | POA: Diagnosis not present

## 2016-08-02 NOTE — Progress Notes (Signed)
Electrophysiology Office Note   Date:  08/02/2016   ID:  Jeremiah [name], DOB August 06, 1946, MRN [id number]  PCP:  [name], MD  Cardiologist:  Debara Pickett Primary Electrophysiologist:  Constance Haw, MD    Chief Complaint  Patient presents with  . New Patient (Initial Visit)    SVT,SOB     History of Present Illness: Jeremiah [name] is a 70 y.o. male who presents today for electrophysiology evaluation.   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 He is originally from [place] and his wife who is accompanying him today is originally from [place]. Mr. Cocking had an MI in 2006 and underwent coronary artery bypass grafting with a LIMA to LAD, SVG to OM1, and SVG to RPDA. He did fairly well with this however 2015 was having chest discomfort. He underwent a nuclear stress test which showed possible reversible ischemia. Therefore he was referred for cardiac catheterization at [facility] [place]. That demonstrated all 3 patent bypass grafts with multivessel coronary disease. At that time he was also having episodes of palpitations and a monitor demonstrated PSVT. He was scheduled for possible ablation but is had chronic problems with an ankle fracture and nonhealing wound. He therefore never underwent ablation. He was placed on digoxin and has been on metoprolol but stopped the digoxin at some point in the past. He reports over the past 8-12 months she's had no further palpitations.  He was hospitalized in June for chest pain and was found to be tachycardic.  Troponin was elevated at 0.12. He underwent cardiac cath showing patent grafts and native CAD.  Since admission, has had no further episodes of tachycardia.  His metoprolol was increased to 100 mg twice a day. He says that he occasionally wears his BiPAP when he sleeps.  Today, he denies symptoms of palpitations, chest pain, shortness of breath, orthopnea, PND, lower extremity edema, claudication, dizziness,  presyncope, syncope, bleeding, or neurologic sequela. The patient is tolerating medications without difficulties and is otherwise without complaint today.    Past Medical History:  Diagnosis Date  . A-fib ([place])   . CAD (coronary artery disease)    a. 2006: CABG in 2006 with LIMA-LAD, SVG-OM1, and SVG-RPDA  . Chronic pain 03/21/2016  . Essential hypertension 03/21/2016  . History of prostate cancer 03/21/2016  . HLD (hyperlipidemia)   . HTN (hypertension)   . Hx of CABG 2006  . Hypercholesteremia 03/21/2016  . Hypothyroidism   . Medication management 03/21/2016  . Morbid obesity ([place]) 03/21/2016  . Myocardial infarct ([place])   . OSA (obstructive sleep apnea) 03/21/2016  . Pain in right ankle and joints of right foot 03/21/2016  . Paronychia 03/21/2016  . Primary insomnia 03/21/2016  . Prostate cancer ([place]) 2008  . Tobacco dependence 03/21/2016   Past Surgical History:  Procedure Laterality Date  . ANKLE SURGERY  12/2013  . CARDIAC CATHETERIZATION N/A 05/08/2016   Procedure: Left Heart Cath and Cors/Grafts Angiography;  Surgeon: Belva Crome, MD;  Location: [facility];  Service: Cardiovascular;  Laterality: N/A;  . CORONARY ARTERY BYPASS GRAFT  2006   x3  . PROSTATE SURGERY  2008     Current Outpatient Prescriptions  Medication Sig Dispense Refill  . aspirin 81 MG tablet Take 81 mg by mouth daily.    Marland Kitchen atorvastatin (LIPITOR) 80 MG tablet Take 80 mg by mouth daily.    Marland Kitchen levothyroxine (SYNTHROID, LEVOTHROID) 75 MCG tablet Take 75 mcg by mouth daily  before breakfast.    . metoprolol (LOPRESSOR) 100 MG tablet Take 100 mg by mouth 2 (two) times daily.     No current facility-administered medications for this visit.     Allergies:   Scallops [shellfish allergy]   Social History:  The patient  reports that he quit smoking about 2 months ago. His smoking use included Cigarettes. He smoked 0.50 packs per day. He has never used smokeless tobacco. He reports that he drinks alcohol.    Family History:  The patient's family history includes Dementia in his mother; Diabetes in his sister.    ROS:  Please see the history of present illness.   Otherwise, review of systems is positive for snoring, anxiety.   All other systems are reviewed and negative.    PHYSICAL EXAM: VS:  BP 140/78   Pulse 60   Ht 5\' 7"  (1.702 m)   Wt 246 lb 6.4 oz (111.8 kg)   BMI 38.59 kg/m  , BMI Body mass index is 38.59 kg/m. GEN: Well nourished, well developed, in no acute distress  HEENT: normal  Neck: no JVD, carotid bruits, or masses Cardiac: RRR; no murmurs, rubs, or gallops,no edema  Respiratory:  clear to auscultation bilaterally, normal work of breathing GI: soft, nontender, nondistended, + BS MS: no deformity or atrophy  Skin: warm and dry Neuro:  Strength and sensation are intact Psych: euthymic mood, full affect  EKG:  EKG is ordered today. Personal review of the ekg ordered 8/24/17shows sinus rhythm, LAA, T wave abnormality, rate 61  Recent Labs: 05/05/2016: ALT 21; B Natriuretic Peptide 90.3; BUN 21; Creatinine, Ser 1.10; Hemoglobin 16.0; Platelets 204; Sodium 138 05/08/2016: Potassium 4.1    Lipid Panel  No results found for: CHOL, TRIG, HDL, CHOLHDL, VLDL, LDLCALC, LDLDIRECT   Wt Readings from Last 3 Encounters:  08/02/16 246 lb 6.4 oz (111.8 kg)  07/26/16 246 lb (111.6 kg)  05/08/16 230 lb (104.3 kg)      Other studies Reviewed: Additional studies/ records that were reviewed today include: Cardiac cath 05/08/16  Review of the above records today demonstrates:  1. LM lesion, 85% stenosed. 2. Ost 1st Mrg lesion, 100% stenosed. 3. SVG was injected . 4. Ost RCA to Prox RCA lesion, 100% stenosed. 5. SVG . 6. Origin to Prox Graft lesion, 40% stenosed. 7. Mid RCA to Dist RCA lesion, 100% stenosed. 8. Prox LAD lesion, 100% stenosed. 9. LIMA .    Patent saphenous vein grafts. SVG to PDA contains eccentric 40% proximal stenosis. SVG to the circumflex is widely  patent.  LIMA to the LAD is widely patent.  Native distal left main contains 75% stenosis. The dominant obtuse marginal branch is totally occluded. The native right coronary is totally occluded.  Left ventriculogram demonstrates inferior wall hypokinesis. EF 50%.  Recent chest pain in the setting of PSVT with minimal enzyme elevation likely related to demand ischemia in the setting of underlying native coronary disease.   ASSESSMENT AND PLAN:  1.  SVT:  Discussed with him the options of medical management versus ablation for his SVT. His metoprolol was recently increased, and he has had no further episodes. Stress the risks and benefits of ablation. Risks include bleeding, tamponade, heart block, and stroke. Both the patient and his wife understand the risks and they would like to think about whether or not they want the procedure. We Shannan Slinker give him information on ablation today.  2. OSA: Wears BiPAP inconsistently. Encouraged consistent treatment  3. CAD: s/p 3V  CABG 2015.   4. Hyperlipidemia: on lipitor    Current medicines are reviewed at length with the patient today.   The patient does not have concerns regarding his medicines.  The following changes were made today:  none  Labs/ tests ordered today include:  No orders of the defined types were placed in this encounter.    Disposition:   FU with Shakinah Navis 3 months  Signed, Dmarion Perfect Meredith Leeds, MD  08/02/2016 9:10 AM     [facility] [address] [place] [address] [phone] (office) [phone] (fax)

## 2016-08-02 NOTE — Patient Instructions (Addendum)
Medication Instructions:  Your physician recommends that you continue on your current medications as directed. Please refer to the Current Medication list given to you today.  Labwork: None ordered  Testing/Procedures: None ordered  Follow-Up: Your physician recommends that you schedule a follow-up appointment in: 3 months with Dr. Curt Bears.  If you need a refill on your cardiac medications before your next appointment, please call your pharmacy.  Thank you for choosing [facility]!!   Trinidad Curet, RN [phone]  Any Other Special Instructions Will Be Listed Below (If Applicable). Cardiac Ablation Cardiac ablation is a procedure to disable a small amount of heart tissue in very specific places. The heart has many electrical connections. Sometimes these connections are abnormal and can cause the heart to beat very fast or irregularly. By disabling some of the problem areas, heart rhythm can be improved or made normal. Ablation is done for people who:   Have Wolff-Parkinson-White syndrome.   Have other fast heart rhythms (tachycardia).   Have taken medicines for an abnormal heart rhythm (arrhythmia) that resulted in:   No success.   Side effects.   May have a high-risk heartbeat that could result in death.  LET [facility] CARE PROVIDER KNOW ABOUT:   Any allergies you have or any previous reactions you have had to X-ray dye, food (such as seafood), medicine, or tape.   All medicines you are taking, including vitamins, herbs, eye drops, creams, and over-the-counter medicines.   Previous problems you or members of your family have had with the use of anesthetics.   Any blood disorders you have.   Previous surgeries or procedures (such as a kidney transplant) you have had.   Medical conditions you have (such as kidney failure).  RISKS AND COMPLICATIONS Generally, cardiac ablation is a safe procedure. However, problems can occur and include:   Increased  risk of cancer. Depending on how long it takes to do the ablation, the dose of radiation can be high.  Bruising and bleeding where a thin, flexible tube (catheter) was inserted during the procedure.   Bleeding into the chest, especially into the sac that surrounds the heart (serious).  Need for a permanent pacemaker if the normal electrical system is damaged.   The procedure may not be fully effective, and this may not be recognized for months. Repeat ablation procedures are sometimes required. BEFORE THE PROCEDURE   Follow any instructions from your health care provider regarding eating and drinking before the procedure.   Take your medicines as directed at regular times with water, unless instructed otherwise by your health care provider. If you are taking diabetes medicine, including insulin, ask how you are to take it and if there are any special instructions you should follow. It is common to adjust insulin dosing the day of the ablation.  PROCEDURE  An ablation is usually performed in a catheterization laboratory with the guidance of fluoroscopy. Fluoroscopy is a type of X-ray that helps your health care provider see images of your heart during the procedure.   An ablation is a minimally invasive procedure. This means a small cut (incision) is made in either your neck or groin. Your health care provider will decide where to make the incision based on your medical history and physical exam.  An IV tube will be started before the procedure begins. You will be given an anesthetic or medicine to help you relax (sedative).  The skin on your neck or groin will be numbed. A needle will be  inserted into a large vein in your neck or groin and catheters will be threaded to your heart.  A special dye that shows up on fluoroscopy pictures may be injected through the catheter. The dye helps your health care provider see the area of the heart that needs treatment.  The catheter has electrodes  on the tip. When the area of heart tissue that is causing the arrhythmia is found, the catheter tip will send an electrical current to the area and "scar" the tissue. Three types of energy can be used to ablate the heart tissue:   Heat (radiofrequency energy).   Laser energy.   Extreme cold (cryoablation).   When the area of the heart has been ablated, the catheter will be taken out. Pressure will be held on the insertion site. This will help the insertion site clot and keep it from bleeding. A bandage will be placed on the insertion site.  AFTER THE PROCEDURE   After the procedure, you will be taken to a recovery area where your vital signs (blood pressure, heart rate, and breathing) will be monitored. The insertion site will also be monitored for bleeding.   You will need to lie still for 4-6 hours. This is to ensure you do not bleed from the catheter insertion site.    This information is not intended to replace advice given to you by your health care provider. Make sure you discuss any questions you have with your health care provider.   Document Released: 04/07/2009 Document Revised: 12/10/2014 Document Reviewed: 04/13/2013 Elsevier Interactive Patient Education [employer].

## 2016-08-07 DIAGNOSIS — M659 Synovitis and tenosynovitis, unspecified: Secondary | ICD-10-CM | POA: Diagnosis not present

## 2016-08-23 ENCOUNTER — Emergency Department (HOSPITAL_COMMUNITY)
Admission: EM | Admit: 2016-08-23 | Discharge: 2016-08-23 | Disposition: A | Discharge status: Home / Self Care | Payer: Federal, State, Local not specified - PPO | Attending: Emergency Medicine | Admitting: Emergency Medicine

## 2016-08-23 ENCOUNTER — Other Ambulatory Visit: Payer: Self-pay

## 2016-08-23 ENCOUNTER — Encounter (HOSPITAL_COMMUNITY): Payer: Self-pay

## 2016-08-23 ENCOUNTER — Emergency Department (HOSPITAL_COMMUNITY): Payer: Federal, State, Local not specified - PPO

## 2016-08-23 DIAGNOSIS — R079 Chest pain, unspecified: Secondary | ICD-10-CM

## 2016-08-23 DIAGNOSIS — R7989 Other specified abnormal findings of blood chemistry: Secondary | ICD-10-CM | POA: Diagnosis not present

## 2016-08-23 DIAGNOSIS — E039 Hypothyroidism, unspecified: Secondary | ICD-10-CM | POA: Insufficient documentation

## 2016-08-23 DIAGNOSIS — Z955 Presence of coronary angioplasty implant and graft: Secondary | ICD-10-CM | POA: Insufficient documentation

## 2016-08-23 DIAGNOSIS — Z8546 Personal history of malignant neoplasm of prostate: Secondary | ICD-10-CM | POA: Diagnosis not present

## 2016-08-23 DIAGNOSIS — Z951 Presence of aortocoronary bypass graft: Secondary | ICD-10-CM | POA: Diagnosis not present

## 2016-08-23 DIAGNOSIS — R0789 Other chest pain: Secondary | ICD-10-CM | POA: Diagnosis not present

## 2016-08-23 DIAGNOSIS — R749 Abnormal serum enzyme level, unspecified: Secondary | ICD-10-CM | POA: Insufficient documentation

## 2016-08-23 DIAGNOSIS — I471 Supraventricular tachycardia: Secondary | ICD-10-CM | POA: Diagnosis not present

## 2016-08-23 DIAGNOSIS — Z87891 Personal history of nicotine dependence: Secondary | ICD-10-CM | POA: Insufficient documentation

## 2016-08-23 DIAGNOSIS — I252 Old myocardial infarction: Secondary | ICD-10-CM | POA: Diagnosis not present

## 2016-08-23 DIAGNOSIS — I2510 Atherosclerotic heart disease of native coronary artery without angina pectoris: Secondary | ICD-10-CM | POA: Diagnosis not present

## 2016-08-23 DIAGNOSIS — R778 Other specified abnormalities of plasma proteins: Secondary | ICD-10-CM

## 2016-08-23 LAB — CBC
HEMATOCRIT: 51 % (ref 39.0–52.0)
Hemoglobin: 16.4 g/dL (ref 13.0–17.0)
MCH: 32.1 pg (ref 26.0–34.0)
MCHC: 32.2 g/dL (ref 30.0–36.0)
MCV: 99.8 fL (ref 78.0–100.0)
Platelets: 214 10*3/uL (ref 150–400)
RBC: 5.11 MIL/uL (ref 4.22–5.81)
RDW: 15.4 % (ref 11.5–15.5)
WBC: 9.5 10*3/uL (ref 4.0–10.5)

## 2016-08-23 LAB — COMPREHENSIVE METABOLIC PANEL
ALBUMIN: 4.2 g/dL (ref 3.5–5.0)
ALT: 29 U/L (ref 17–63)
AST: 25 U/L (ref 15–41)
Alkaline Phosphatase: 88 U/L (ref 38–126)
Anion gap: 13 (ref 5–15)
BILIRUBIN TOTAL: 0.5 mg/dL (ref 0.3–1.2)
BUN: 24 mg/dL — AB (ref 6–20)
CHLORIDE: 96 mmol/L — AB (ref 101–111)
CO2: 26 mmol/L (ref 22–32)
CREATININE: 1.51 mg/dL — AB (ref 0.61–1.24)
Calcium: 9.3 mg/dL (ref 8.9–10.3)
GFR calc Af Amer: 53 mL/min — ABNORMAL LOW (ref >60)
GFR, EST NON AFRICAN AMERICAN: 45 mL/min — AB (ref >60)
GLUCOSE: 256 mg/dL — AB (ref 65–99)
Potassium: 4.2 mmol/L (ref 3.5–5.1)
Sodium: 135 mmol/L (ref 135–145)
TOTAL PROTEIN: 7.4 g/dL (ref 6.5–8.1)

## 2016-08-23 LAB — MAGNESIUM: MAGNESIUM: 1.9 mg/dL (ref 1.7–2.4)

## 2016-08-23 LAB — TROPONIN I: TROPONIN I: 0.06 ng/mL — AB (ref <0.03)

## 2016-08-23 LAB — DIGOXIN LEVEL: DIGOXIN LVL: 0.4 ng/mL — AB (ref 0.8–2.0)

## 2016-08-23 LAB — PROTIME-INR
INR: 0.9
Prothrombin Time: 12.1 seconds (ref 11.4–15.2)

## 2016-08-23 MED ORDER — SODIUM CHLORIDE 0.9 % IV SOLN
INTRAVENOUS | Status: DC
  Administered 2016-08-23: 07:00:00 via INTRAVENOUS

## 2016-08-23 MED ORDER — ADENOSINE 6 MG/2ML IV SOLN
INTRAVENOUS | Status: DC | PRN
  Administered 2016-08-23 (×2): 6 mg via INTRAVENOUS

## 2016-08-23 MED ORDER — ADENOSINE 6 MG/2ML IV SOLN
INTRAVENOUS | Status: AC
  Filled 2016-08-23: qty 4

## 2016-08-23 MED ORDER — ASPIRIN 81 MG PO CHEW: 324.0000 mg | CHEWABLE_TABLET | Freq: Once | ORAL | Status: DC

## 2016-08-23 NOTE — ED Notes (Signed)
Cardiology at bedside.

## 2016-08-23 NOTE — ED Provider Notes (Signed)
[facility] DEPT Provider Note   CSN: [id number] Arrival date & time: 08/23/16  [id number]     History   Chief Complaint Chief Complaint  Patient presents with  . Chest Pain    pt arrives in SVT able to answer questions appropriate pt has a history of this     HPI Jeremiah [name] is a 70 y.o. male.  HPI Patient presents dyspneic, unsettled, nauseous, with concern for palpitations. Symptoms began about 2 hours prior to ED arrival, awakening the patient. Patient had a similar episode 2 days ago, but that resolved spontaneously. Patient has had additional other episodes in the distant past, requiring adenosine and/or cardioversion. The day, he states that he was well until this morning, now has persistent symptoms. He is scheduled for elective ablation with cardiology. Since onset of clear alleviating or exacerbating factors. There is minimal chest discomfort, mild dyspnea as well.  Past Medical History:  Diagnosis Date  . A-fib ([place])   . CAD (coronary artery disease)    a. 2006: CABG in 2006 with LIMA-LAD, SVG-OM1, and SVG-RPDA  . Chronic pain 03/21/2016  . Essential hypertension 03/21/2016  . History of prostate cancer 03/21/2016  . HLD (hyperlipidemia)   . HTN (hypertension)   . Hx of CABG 2006  . Hypercholesteremia 03/21/2016  . Hypothyroidism   . Medication management 03/21/2016  . Morbid obesity ([place]) 03/21/2016  . Myocardial infarct ([place])   . OSA (obstructive sleep apnea) 03/21/2016  . Pain in right ankle and joints of right foot 03/21/2016  . Paronychia 03/21/2016  . Primary insomnia 03/21/2016  . Prostate cancer ([place]) 2008  . Tobacco dependence 03/21/2016    Patient Active Problem List   Diagnosis Date Noted  . Chest pain 05/05/2016  . NSTEMI (non-ST elevated myocardial infarction) ([place]) 05/05/2016  . SVT (supraventricular tachycardia) ([place]) 03/21/2016  . S/P CABG x 3 03/21/2016  . Morbid obesity ([place]) 03/21/2016  . Essential hypertension 03/21/2016  .  Hypercholesteremia 03/21/2016  . Paronychia 03/21/2016  . Tobacco dependence 03/21/2016  . Pain in right ankle and joints of right foot 03/21/2016  . Chronic pain 03/21/2016  . OSA (obstructive sleep apnea) 03/21/2016  . Medication management 03/21/2016  . History of prostate cancer 03/21/2016  . Primary insomnia 03/21/2016    Past Surgical History:  Procedure Laterality Date  . ANKLE SURGERY  12/2013  . CARDIAC CATHETERIZATION N/A 05/08/2016   Procedure: Left Heart Cath and Cors/Grafts Angiography;  Surgeon: Belva Crome, MD;  Location: [facility];  Service: Cardiovascular;  Laterality: N/A;  . CORONARY ARTERY BYPASS GRAFT  2006   x3  . PROSTATE SURGERY  2008       Home Medications    Prior to Admission medications   Medication Sig Start Date End Date Taking? Authorizing Provider  aspirin 81 MG tablet Take 81 mg by mouth daily.   Yes Historical Provider, MD  atorvastatin (LIPITOR) 80 MG tablet Take 80 mg by mouth daily.   Yes Historical Provider, MD  digoxin (LANOXIN) 0.25 MG tablet Take 0.25 mg by mouth daily as needed (heart palpitations).    Yes Historical Provider, MD  levothyroxine (SYNTHROID, LEVOTHROID) 75 MCG tablet Take 75 mcg by mouth daily before breakfast.   Yes Historical Provider, MD  metoprolol (LOPRESSOR) 100 MG tablet Take 100 mg by mouth 2 (two) times daily.   Yes Historical Provider, MD  sodium-potassium bicarbonate (ALKA-SELTZER GOLD) TBEF dissolvable tablet Take 2 tablets by mouth 2 (two) times daily as needed (sour stomach).  Yes Historical Provider, MD  Turmeric 450 MG CAPS Take 450 mg by mouth daily.   Yes Historical Provider, MD    Family History Family History  Problem Relation Age of Onset  . Dementia Mother   . Diabetes Sister     Social History Social History  Substance Use Topics  . Smoking status: Former Smoker    Packs/day: 0.50    Types: Cigarettes    Quit date: 05/26/2016  . Smokeless tobacco: Never Used  . Alcohol use Yes       Comment: 1 beer per month     Allergies   Scallops [shellfish allergy] and Fluzone [flu virus vaccine]   Review of Systems Review of Systems  Constitutional:       Per HPI, otherwise negative  HENT:       Per HPI, otherwise negative  Respiratory:       Per HPI, otherwise negative  Cardiovascular:       Per HPI, otherwise negative  Gastrointestinal: Positive for nausea. Negative for vomiting.  Endocrine:       Negative aside from HPI  Genitourinary:       Neg aside from HPI   Musculoskeletal:       Per HPI, otherwise negative  Skin: Positive for color change.  Neurological: Positive for weakness and light-headedness. Negative for syncope.     Physical Exam Updated Vital Signs BP 115/69 (BP Location: Left Arm)   Pulse (!) 58   Resp 17   Ht 5\' 10"  (1.778 m)   Wt 225 lb (102.1 kg)   SpO2 90%   BMI 32.28 kg/m   Physical Exam  Constitutional: He is oriented to person, place, and time. He appears well-developed. No distress.  Uncomfortable appearing male sitting upright in bed, speaking clearly, answering questions appropriately.  HENT:  Head: Normocephalic and atraumatic.  Eyes: Conjunctivae and EOM are normal.  Cardiovascular: Regular rhythm.  Tachycardia present.   Pulmonary/Chest: No stridor. Tachypnea noted. No respiratory distress.  Abdominal: He exhibits no distension.  Musculoskeletal: He exhibits no edema.  Neurological: He is alert and oriented to person, place, and time.  Skin: Skin is warm. He is diaphoretic.  Psychiatric: He has a normal mood and affect.  Nursing note and vitals reviewed.    ED Treatments / Results  Labs (all labs ordered are listed, but only abnormal results are displayed) Labs Reviewed  COMPREHENSIVE METABOLIC PANEL - Abnormal; Notable for the following:       Result Value   Chloride 96 (*)    Glucose, Bld 256 (*)    BUN 24 (*)    Creatinine, Ser 1.51 (*)    GFR calc non Af Amer 45 (*)    GFR calc Af Amer 53 (*)     All other components within normal limits  TROPONIN I - Abnormal; Notable for the following:    Troponin I 0.06 (*)    All other components within normal limits  DIGOXIN LEVEL - Abnormal; Notable for the following:    Digoxin Level 0.4 (*)    All other components within normal limits  CBC  MAGNESIUM  PROTIME-INR    ECG #1 - SVT, ST waves changes, rate 176, abnormal    EKG#2    EKG Interpretation  Date/Time:  Thursday August 23 2016 07:39:27 EDT Ventricular Rate:  77 PR Interval:    QRS Duration: 98 QT Interval:  434 QTC Calculation: 492 R Axis:   53 Text Interpretation:  Sinus rhythm Abnormal R-wave  progression, early transition Repol abnrm, severe global ischemia (LM/MVD) Abnormal ekg now improved from last ECG (SVT) Confirmed by Carmin Muskrat  MD [phone]) on 08/23/2016 7:43:10 AM       Radiology Dg Chest Port 1 View  Result Date: 08/23/2016 CLINICAL DATA:  Left chest and arm pain today. EXAM: PORTABLE CHEST 1 VIEW COMPARISON:  05/05/2016 FINDINGS: Prominent lung markings appear chronic. There is no focal airspace disease or pulmonary edema. Heart size is upper limits of normal. There are median sternotomy wires. The trachea is midline. No large pleural effusions. IMPRESSION: No acute chest abnormality. Electronically Signed   By: Markus Daft M.D.   On: 08/23/2016 08:12    Procedures .Cardioversion Date/Time: 08/23/2016 7:30 AM Performed by: Carmin Muskrat Authorized by: Carmin Muskrat   Consent:    Consent obtained:  Verbal and emergent situation   Consent given by:  Patient   Risks discussed:  Induced arrhythmia   Alternatives discussed:  Alternative treatment and delayed treatment Pre-procedure details:    Cardioversion basis:  Emergent   Rhythm:  Supraventricular tachycardia   Electrode placement:  Anterior-posterior Attempt one:    Cardioversion mode attempt one: adenosine, 6mg , then 12mg . Post-procedure details:    Patient status:  Awake   Patient  tolerance of procedure:  Tolerated well, no immediate complications Comments:     Following 2 attempts w adenosine, the patient had successful cardioversion to SR.   (including critical care time)  Medications Ordered in ED Medications  aspirin chewable tablet 324 mg (324 mg Oral Not Given 08/23/16 0815)  0.9 %  sodium chloride infusion ( Intravenous New Bag/Given 08/23/16 0725)  adenosine (ADENOCARD) 6 MG/2ML injection ( Intravenous Canceled Entry 08/23/16 0745)     Initial Impression / Assessment and Plan / ED Course  I have reviewed the triage vital signs and the nursing notes.  Pertinent labs & imaging results that were available during my care of the patient were reviewed by me and considered in my medical decision making (see chart for details).  Clinical Course    10:32 AM Successful cardioversion w Adenosine.  10:32 AM Patient in SR, rate 80's.  Initial labs notable for elevated troponin, subtherapeutic digoxin, elevated Creatinine.  IVF running, cardiology aware, evaluating the patient.  10:32 AM Case d/w cards.  Patient will have AM cardioversion. Patient remains in sinus rhythm, no new complaints, no recurrent SVT  Final Clinical Impressions(s) / ED Diagnoses  Patient presents diaphoretic, uncomfortable appearing, and is found tohave supraventricular tachycardia.   Here, after transferring to cardiac monitors, receiving supplemental oxygen the patient had successful cardioversion with adenosine. Patient's supplemental labs notable for elevated troponin, subtherapeutic digoxin, elevated creatinine. Patient was previously scheduled for outpatient ablation, and after discussion with cardiology, patient will have cardioversion tomorrow.  CRITICAL CARE Performed by: Carmin Muskrat Total critical care time: 35 minutes Critical care time was exclusive of separately billable procedures and treating other patients. Critical care was necessary to treat or prevent imminent  or life-threatening deterioration. Critical care was time spent personally by me on the following activities: development of treatment plan with patient and/or surrogate as well as nursing, discussions with consultants, evaluation of patient's response to treatment, examination of patient, obtaining history from patient or surrogate, ordering and performing treatments and interventions, ordering and review of laboratory studies, ordering and review of radiographic studies, pulse oximetry and re-evaluation of patient's condition.     Carmin Muskrat, MD 08/23/16 [phone]

## 2016-08-23 NOTE — ED Notes (Signed)
Pt departed with wife in no distress pulse rate within normal limits at time of discharge instructed to return should any thing change or for any concerns

## 2016-08-23 NOTE — ED Notes (Signed)
Pt in stretcher appears in no distress vagal maneuvers attempted with no relief of elevated HR adenosine pulled ED MD at [facility] pt placed on 2L [place] IV in place

## 2016-08-23 NOTE — ED Notes (Signed)
12 mg IV adenosine with good effect pt's HR now in the 70's ekg capture complete

## 2016-08-23 NOTE — Consult Note (Signed)
ELECTROPHYSIOLOGY CONSULT NOTE    Patient ID: Artimus Gruver MRN: [id number], DOB/AGE: Apr 22, 1946 70 y.o.  Admit date: 08/23/2016 Date of Consult: 08/23/2016  Primary Physician: [name], MD Primary Cardiologist: [place] Electrophysiologist: Curt Bears Requesting MD: ER  Reason for Consultation: SVT  HPI:  Daryel Arroyos is a 70 y.o. male with a past medical history significant for CAD s/p CABG, hypertension, hyperlipidemia, OSA, obesity, prostate cancer.  He has a longstanding history of SVT.  AF is listed in the patient's past medical history but he is not on Eva and I cannot find documentation of AF.  He has been on BB therapy and had done relatively well recently. He was seen by Dr Curt Bears. Ablation was offered at that time but the patient wanted to think about it.   He was in [place] over the weekend and had another episode of SVT. He took Digoxin and the episode finally broke after 4 hours. He then had recurrent tachycardia that started around 5AM this morning and was unable to terminate it with vagal maneuvers. He was seen in the ER for evaluation and was treated with Adenosine which terminated the tachycardia. With tachycardia, he is symptomatic with shortness of breath and chest tightness.  He is in SR now and asymptomatic.  He has recently quit smoking.  He denies recent LE edema, nausea, vomiting, fevers, or chills.  LHC 05/2016 demonstrated patent grafts, EF 50%.   Past Medical History:  Diagnosis Date  . A-fib ([place])   . CAD (coronary artery disease)    a. 2006: CABG in 2006 with LIMA-LAD, SVG-OM1, and SVG-RPDA  . Chronic pain 03/21/2016  . Essential hypertension 03/21/2016  . History of prostate cancer 03/21/2016  . HLD (hyperlipidemia)   . HTN (hypertension)   . Hx of CABG 2006  . Hypercholesteremia 03/21/2016  . Hypothyroidism   . Medication management 03/21/2016  . Morbid obesity ([place]) 03/21/2016  . Myocardial infarct ([place])   . OSA (obstructive sleep apnea) 03/21/2016  .  Pain in right ankle and joints of right foot 03/21/2016  . Paronychia 03/21/2016  . Primary insomnia 03/21/2016  . Prostate cancer ([place]) 2008  . Tobacco dependence 03/21/2016     Surgical History:  Past Surgical History:  Procedure Laterality Date  . ANKLE SURGERY  12/2013  . CARDIAC CATHETERIZATION N/A 05/08/2016   Procedure: Left Heart Cath and Cors/Grafts Angiography;  Surgeon: Belva Crome, MD;  Location: [facility];  Service: Cardiovascular;  Laterality: N/A;  . CORONARY ARTERY BYPASS GRAFT  2006   x3  . PROSTATE SURGERY  2008      (Not in a hospital admission)  Inpatient Medications:  . aspirin  324 mg Oral Once    Allergies:  Allergies  Allergen Reactions  . Scallops [Shellfish Allergy]     Rash; shortness of breath  . Fluzone [Flu Virus Vaccine] Nausea And Vomiting, Palpitations and Rash    Social History   Social History  . Marital status: Married    Spouse name: N/A  . Number of children: 0  . Years of education: N/A   Occupational History  . retired - Passenger transport manager    Social History Main Topics  . Smoking status: Former Smoker    Packs/day: 0.50    Types: Cigarettes    Quit date: 05/26/2016  . Smokeless tobacco: Never Used  . Alcohol use Yes     Comment: 1 beer per month  . Drug use: No  . Sexual activity: Not on file  Other Topics Concern  . Not on file   Social History Narrative   Retired, married, no children      epworth sleepiness scale = 9 (04/13/16) (has OSA)     Family History  Problem Relation Age of Onset  . Dementia Mother   . Diabetes Sister      Review of Systems: All other systems reviewed and are otherwise negative except as noted above.  Physical Exam: Vitals:   08/23/16 0830 08/23/16 0841 08/23/16 0845 08/23/16 0918  BP: 106/76  119/75 123/65  Pulse: 62  62 (!) 59  Resp: 25  21 17   SpO2: 92%  92% 94%  Weight:  225 lb (102.1 kg)    Height:  5\' 10"  (1.778 m)      GEN- The patient is elderly and obese  appearing, alert and oriented x 3 today.   HEENT: normocephalic, atraumatic; sclera clear, conjunctiva pink; hearing intact; oropharynx clear; neck supple  Lungs- Clear to ausculation bilaterally, normal work of breathing.  No wheezes, rales, rhonchi Heart- Regular rate and rhythm  GI- soft, non-tender, non-distended, bowel sounds present  Extremities- no clubbing, cyanosis, or edema  MS- no significant deformity or atrophy Skin- warm and dry, no rash or lesion Psych- euthymic mood, full affect Neuro- strength and sensation are intact  Labs:   Lab Results  Component Value Date   WBC 9.5 08/23/2016   HGB 16.4 08/23/2016   HCT 51.0 08/23/2016   MCV 99.8 08/23/2016   PLT 214 08/23/2016     Recent Labs Lab 08/23/16 0740  NA 135  K 4.2  CL 96*  CO2 26  BUN 24*  CREATININE 1.51*  CALCIUM 9.3  PROT 7.4  BILITOT 0.5  ALKPHOS 88  ALT 29  AST 25  GLUCOSE 256*      Radiology/Studies: Dg Chest Port 1 View Result Date: 08/23/2016 CLINICAL DATA:  Left chest and arm pain today. EXAM: PORTABLE CHEST 1 VIEW COMPARISON:  05/05/2016 FINDINGS: Prominent lung markings appear chronic. There is no focal airspace disease or pulmonary edema. Heart size is upper limits of normal. There are median sternotomy wires. The trachea is midline. No large pleural effusions. IMPRESSION: No acute chest abnormality. Electronically Signed   By: Markus Daft M.D.   On: 08/23/2016 08:12    EKG: short RP tachycardia, ventricular rate 177  Assessment/Plan: 1.  Adenosine sensitive short-RP tachycardia The patient has recurrent adenosine sensitive mid-RP tachycardia despite treatment with beta blocker. He is now willing to undergo ablation. Risks, benefits previously discussed by Dr Curt Bears with patient.   He is stable from discharge to home from ER. Moon Budde schedule ablation at next available time.   2.  CAD No recent ischemic symptoms Continue medical therapy Recent cath stable  3.  OSA Encouraged use of  Bipap  Dr Curt Bears to see later this morning  Signed, Chanetta Marshall, NP 08/23/2016 9:45 AM  I have seen and examined this patient with Chanetta Marshall.  Agree with above, note added to reflect my findings.  On exam, regular rhythm, no murmurs, lungs clear.  Presented with SVT which responded to adenosine.  Appears to be AVNRT on ECG.  Discussed options of therapy including ablation.  Has agreed to ablation.  Loralee Weitzman plan for tomorrow AM.  Patient can be discharged and Saretta Dahlem come back to short stay tomorrow AM.  Christophr Calix M. Lettie Czarnecki MD 08/23/2016 12:17 PM

## 2016-08-23 NOTE — Discharge Instructions (Signed)
Please be sure to follow-up with our cardiologist's, as discussed.  Return here for concerning changes in your condition.

## 2016-08-23 NOTE — ED Notes (Signed)
6 mg IV adenosine given to patient no change in HR pt remains in no distress awake and talking able to answer questions appropriately

## 2016-08-23 NOTE — ED Triage Notes (Signed)
Pt arrived to triage with chief complaint of feeling that his heart was racing pt has a history of SVT and was seen and treated here approx 4 months ago for the same thing.  Pt is currently waiting to have an ablation scheduled

## 2016-08-23 NOTE — ED Triage Notes (Signed)
Pt here for chest pain and sob.

## 2016-08-23 NOTE — ED Notes (Signed)
Cardiology NP at bedside.

## 2016-08-24 ENCOUNTER — Encounter (HOSPITAL_COMMUNITY)
Admission: RE | Disposition: A | Discharge status: Home / Self Care | Payer: Self-pay | Source: Ambulatory Visit | Attending: Cardiology

## 2016-08-24 ENCOUNTER — Ambulatory Visit (HOSPITAL_COMMUNITY)
Admission: RE | Admit: 2016-08-24 | Discharge: 2016-08-24 | Disposition: A | Discharge status: Home / Self Care | Payer: Federal, State, Local not specified - PPO | Source: Ambulatory Visit | Attending: Cardiology | Admitting: Cardiology

## 2016-08-24 ENCOUNTER — Encounter (HOSPITAL_COMMUNITY): Payer: Self-pay | Admitting: Cardiology

## 2016-08-24 DIAGNOSIS — I471 Supraventricular tachycardia: Secondary | ICD-10-CM | POA: Diagnosis not present

## 2016-08-24 DIAGNOSIS — I4719 Other supraventricular tachycardia: Secondary | ICD-10-CM | POA: Diagnosis present

## 2016-08-24 DIAGNOSIS — I252 Old myocardial infarction: Secondary | ICD-10-CM | POA: Diagnosis not present

## 2016-08-24 DIAGNOSIS — Z8546 Personal history of malignant neoplasm of prostate: Secondary | ICD-10-CM | POA: Insufficient documentation

## 2016-08-24 DIAGNOSIS — E039 Hypothyroidism, unspecified: Secondary | ICD-10-CM | POA: Insufficient documentation

## 2016-08-24 DIAGNOSIS — Z951 Presence of aortocoronary bypass graft: Secondary | ICD-10-CM | POA: Insufficient documentation

## 2016-08-24 DIAGNOSIS — E7800 Pure hypercholesterolemia, unspecified: Secondary | ICD-10-CM | POA: Insufficient documentation

## 2016-08-24 DIAGNOSIS — I10 Essential (primary) hypertension: Secondary | ICD-10-CM | POA: Diagnosis not present

## 2016-08-24 DIAGNOSIS — Z6837 Body mass index (BMI) 37.0-37.9, adult: Secondary | ICD-10-CM | POA: Diagnosis not present

## 2016-08-24 DIAGNOSIS — Z7982 Long term (current) use of aspirin: Secondary | ICD-10-CM | POA: Diagnosis not present

## 2016-08-24 DIAGNOSIS — I2510 Atherosclerotic heart disease of native coronary artery without angina pectoris: Secondary | ICD-10-CM | POA: Diagnosis not present

## 2016-08-24 DIAGNOSIS — E785 Hyperlipidemia, unspecified: Secondary | ICD-10-CM | POA: Diagnosis not present

## 2016-08-24 DIAGNOSIS — G4733 Obstructive sleep apnea (adult) (pediatric): Secondary | ICD-10-CM | POA: Insufficient documentation

## 2016-08-24 DIAGNOSIS — Z87891 Personal history of nicotine dependence: Secondary | ICD-10-CM | POA: Diagnosis not present

## 2016-08-24 DIAGNOSIS — Z79899 Other long term (current) drug therapy: Secondary | ICD-10-CM | POA: Diagnosis not present

## 2016-08-24 HISTORY — PX: ELECTROPHYSIOLOGIC STUDY: SHX172A

## 2016-08-24 SURGERY — A-FLUTTER/A-TACH/SVT ABLATION

## 2016-08-24 MED ORDER — TURMERIC 450 MG PO CAPS: 450.0000 mg | ORAL_CAPSULE | Freq: Every day | ORAL | Status: DC

## 2016-08-24 MED ORDER — ASPIRIN EC 81 MG PO TBEC: 81.0000 mg | DELAYED_RELEASE_TABLET | Freq: Every day | ORAL | Status: DC

## 2016-08-24 MED ORDER — MIDAZOLAM HCL 5 MG/5ML IJ SOLN
INTRAMUSCULAR | Status: AC
  Filled 2016-08-24: qty 5

## 2016-08-24 MED ORDER — SODIUM CHLORIDE 0.9 % IV SOLN
INTRAVENOUS | Status: DC
  Administered 2016-08-24: 07:00:00 via INTRAVENOUS

## 2016-08-24 MED ORDER — FENTANYL CITRATE (PF) 100 MCG/2ML IJ SOLN
INTRAMUSCULAR | Status: AC
  Filled 2016-08-24: qty 2

## 2016-08-24 MED ORDER — HEPARIN (PORCINE) IN NACL 2-0.9 UNIT/ML-% IJ SOLN
INTRAMUSCULAR | Status: DC | PRN
  Administered 2016-08-24: 11:00:00

## 2016-08-24 MED ORDER — ONDANSETRON HCL 4 MG/2ML IJ SOLN: 4.0000 mg | Freq: Four times a day (QID) | INTRAMUSCULAR | Status: DC | PRN

## 2016-08-24 MED ORDER — METOPROLOL TARTRATE 100 MG PO TABS: 100.0000 mg | ORAL_TABLET | Freq: Two times a day (BID) | ORAL | Status: DC

## 2016-08-24 MED ORDER — ISOPROTERENOL HCL 0.2 MG/ML IJ SOLN
INTRAMUSCULAR | Status: AC
  Filled 2016-08-24: qty 5

## 2016-08-24 MED ORDER — SODIUM CHLORIDE 0.9% FLUSH: 3.0000 mL | INTRAVENOUS | Status: DC | PRN

## 2016-08-24 MED ORDER — LEVOTHYROXINE SODIUM 75 MCG PO TABS: 75.0000 ug | ORAL_TABLET | Freq: Every day | ORAL | Status: DC

## 2016-08-24 MED ORDER — SODIUM CHLORIDE 0.9% FLUSH: 3.0000 mL | Freq: Two times a day (BID) | INTRAVENOUS | Status: DC

## 2016-08-24 MED ORDER — HEPARIN (PORCINE) IN NACL 2-0.9 UNIT/ML-% IJ SOLN
INTRAMUSCULAR | Status: AC
  Filled 2016-08-24: qty 500

## 2016-08-24 MED ORDER — MIDAZOLAM HCL 5 MG/5ML IJ SOLN
INTRAMUSCULAR | Status: DC | PRN
  Administered 2016-08-24: 2 mg via INTRAVENOUS
  Administered 2016-08-24: 1 mg via INTRAVENOUS

## 2016-08-24 MED ORDER — DIGOXIN 250 MCG PO TABS: 0.2500 mg | ORAL_TABLET | Freq: Every day | ORAL | Status: DC | PRN

## 2016-08-24 MED ORDER — ACETAMINOPHEN 325 MG PO TABS: 650.0000 mg | ORAL_TABLET | ORAL | Status: DC | PRN

## 2016-08-24 MED ORDER — SODIUM CHLORIDE 0.9 % IV SOLN
INTRAVENOUS | Status: DC | PRN
  Administered 2016-08-24: 2 ug/min via INTRAVENOUS

## 2016-08-24 MED ORDER — BUPIVACAINE HCL (PF) 0.25 % IJ SOLN
INTRAMUSCULAR | Status: AC
  Filled 2016-08-24: qty 30

## 2016-08-24 MED ORDER — ATORVASTATIN CALCIUM 80 MG PO TABS: 80.0000 mg | ORAL_TABLET | Freq: Every day | ORAL | Status: DC

## 2016-08-24 MED ORDER — SODIUM CHLORIDE 0.9 % IV SOLN: 250.0000 mL | INTRAVENOUS | Status: DC | PRN

## 2016-08-24 MED ORDER — SODIUM & POTASSIUM BICARBONATE PO TBEF: 2.0000 | EFFERVESCENT_TABLET | Freq: Two times a day (BID) | ORAL | Status: DC | PRN

## 2016-08-24 MED ORDER — FENTANYL CITRATE (PF) 100 MCG/2ML IJ SOLN
INTRAMUSCULAR | Status: DC | PRN
  Administered 2016-08-24: 25 ug via INTRAVENOUS

## 2016-08-24 MED ORDER — BUPIVACAINE HCL (PF) 0.25 % IJ SOLN
INTRAMUSCULAR | Status: DC | PRN
  Administered 2016-08-24: 60 mL

## 2016-08-24 SURGICAL SUPPLY — 12 items
BAG SNAP BAND KOVER 36X36 (MISCELLANEOUS) ×4 IMPLANT
CATH EZ STEER NAV 4MM D-F CUR (ABLATOR) ×2 IMPLANT
CATH JOSEPH QUAD ALLRED 6F REP (CATHETERS) ×4 IMPLANT
CATH WEBSTER BI DIR CS D-F CRV (CATHETERS) ×2 IMPLANT
PACK EP LATEX FREE (CUSTOM PROCEDURE TRAY) ×1
PACK EP LF (CUSTOM PROCEDURE TRAY) ×1 IMPLANT
PAD DEFIB LIFELINK (PAD) ×2 IMPLANT
PATCH CARTO3 (PAD) ×2 IMPLANT
SHEATH PINNACLE 6F 10CM (SHEATH) ×4 IMPLANT
SHEATH PINNACLE 7F 10CM (SHEATH) ×2 IMPLANT
SHEATH PINNACLE 8F 10CM (SHEATH) ×2 IMPLANT
SHIELD RADPAD SCOOP 12X17 (MISCELLANEOUS) ×2 IMPLANT

## 2016-08-24 NOTE — Progress Notes (Signed)
Pt ambulated in the hall way after bed rest. Both groins a level 0. V/S stable. Discharge instructions including groin site care reviewed with patient. Patient has no questions at this time. Pt discharged home with wife.

## 2016-08-24 NOTE — H&P (Signed)
Jeremiah [name] is a 70 y.o. male who presetned to the hospital yesterday with SVT which broke with adenosine.  He presents today for ablation of SVT.  On exam, regular rhythm, no murmurs, lungs clear.  Risks and benefits of the procedure were explained.  Risks include but are not limited to bleeding, tamponade, heart block, and stroke.  He understands the risks and has agreed to the procedure.  Will Curt Bears, MD 08/24/2016 7:12 AM

## 2016-08-24 NOTE — Progress Notes (Signed)
Site area: RFV x 2 / LFV x2 Site Prior to Removal:  Level 0 / 0 Pressure Applied For:9min Manual:  yes  Patient Status During Pull:  stable Post Pull Site:  Level 0 Post Pull Instructions Given:  yes Post Pull Pulses Present: palpable Dressing Applied:  tegaderm Bedrest begins @ [id number] till 1810 Comments:

## 2016-08-24 NOTE — Discharge Instructions (Signed)
No driving for 3 days. No lifting over 5 lbs for 1 week. No sexual activity for 1 week. Keep procedure site clean & dry. If you notice increased pain, swelling, bleeding or pus, call/return! You may shower, but no soaking baths/hot tubs/pools for 1 week. ° °

## 2016-08-28 ENCOUNTER — Telehealth: Payer: Self-pay | Admitting: *Deleted

## 2016-08-28 ENCOUNTER — Encounter: Payer: Self-pay | Admitting: *Deleted

## 2016-08-28 ENCOUNTER — Encounter (HOSPITAL_BASED_OUTPATIENT_CLINIC_OR_DEPARTMENT_OTHER): Payer: Self-pay | Admitting: Emergency Medicine

## 2016-08-28 ENCOUNTER — Telehealth (HOSPITAL_BASED_OUTPATIENT_CLINIC_OR_DEPARTMENT_OTHER): Payer: Self-pay | Admitting: Emergency Medicine

## 2016-08-28 NOTE — Telephone Encounter (Signed)
Patient calling in needing work note for his wife, dates of 9/21 & 9/22, for missing work to accompany him to his ablation.  Note faxed to their home fax # [phone], per request.

## 2016-08-30 DIAGNOSIS — H35013 Changes in retinal vascular appearance, bilateral: Secondary | ICD-10-CM | POA: Diagnosis not present

## 2016-08-31 DIAGNOSIS — I10 Essential (primary) hypertension: Secondary | ICD-10-CM | POA: Diagnosis not present

## 2016-08-31 DIAGNOSIS — E119 Type 2 diabetes mellitus without complications: Secondary | ICD-10-CM | POA: Diagnosis not present

## 2016-08-31 DIAGNOSIS — G4700 Insomnia, unspecified: Secondary | ICD-10-CM | POA: Diagnosis not present

## 2016-08-31 DIAGNOSIS — Z79899 Other long term (current) drug therapy: Secondary | ICD-10-CM | POA: Diagnosis not present

## 2016-08-31 DIAGNOSIS — E039 Hypothyroidism, unspecified: Secondary | ICD-10-CM | POA: Diagnosis not present

## 2016-09-12 DIAGNOSIS — C44219 Basal cell carcinoma of skin of left ear and external auricular canal: Secondary | ICD-10-CM | POA: Diagnosis not present

## 2016-09-25 DIAGNOSIS — G4733 Obstructive sleep apnea (adult) (pediatric): Secondary | ICD-10-CM | POA: Diagnosis not present

## 2016-09-25 NOTE — Progress Notes (Signed)
Electrophysiology Office Note   Date:  09/26/2016   ID:  Jeremiah [name], DOB 09/12/1946, MRN [id number]  PCP:  [name], MD  Cardiologist:  Debara Pickett Primary Electrophysiologist:  Constance Haw, MD    Chief Complaint  Patient presents with  . Follow-up    post SVT ablation     History of Present Illness: Shea Duro is a 70 y.o. male who presents today for electrophysiology evaluation.   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 He is originally from [place] and his wife who is accompanying him today is originally from [place]. Mr. Betke had an MI in 2006 and underwent coronary artery bypass grafting with a LIMA to LAD, SVG to OM1, and SVG to RPDA. He did fairly well with this however 2015 was having chest discomfort. He underwent a nuclear stress test which showed possible reversible ischemia. Therefore he was referred for cardiac catheterization at [facility] [place]. That demonstrated all 3 patent bypass grafts with multivessel coronary disease. At that time he was also having episodes of palpitations and a monitor demonstrated PSVT. He was scheduled for possible ablation but is had chronic problems with an ankle fracture and nonhealing wound. He therefore never underwent ablation.   Ablation for AVNRT 08/24/16.  Tolerated procedure well without any complaints. Has not noticed any palpitations since that time.  Today, he denies symptoms of palpitations, chest pain, shortness of breath, orthopnea, PND, lower extremity edema, claudication, dizziness, presyncope, syncope, bleeding, or neurologic sequela. The patient is tolerating medications without difficulties and is otherwise without complaint today.    Past Medical History:  Diagnosis Date  . A-fib ([place])   . CAD (coronary artery disease)    a. 2006: CABG in 2006 with LIMA-LAD, SVG-OM1, and SVG-RPDA  . Chronic pain 03/21/2016  . Essential hypertension 03/21/2016  . History of prostate cancer  03/21/2016  . HLD (hyperlipidemia)   . HTN (hypertension)   . Hx of CABG 2006  . Hypercholesteremia 03/21/2016  . Hypothyroidism   . Medication management 03/21/2016  . Morbid obesity ([place]) 03/21/2016  . Myocardial infarct   . OSA (obstructive sleep apnea) 03/21/2016  . Pain in right ankle and joints of right foot 03/21/2016  . Paronychia 03/21/2016  . Primary insomnia 03/21/2016  . Prostate cancer ([place]) 2008  . Tobacco dependence 03/21/2016   Past Surgical History:  Procedure Laterality Date  . ANKLE SURGERY  12/2013  . CARDIAC CATHETERIZATION N/A 05/08/2016   Procedure: Left Heart Cath and Cors/Grafts Angiography;  Surgeon: Belva Crome, MD;  Location: [facility];  Service: Cardiovascular;  Laterality: N/A;  . CORONARY ARTERY BYPASS GRAFT  2006   x3  . ELECTROPHYSIOLOGIC STUDY N/A 08/24/2016   Procedure: SVT Ablation;  Surgeon: Somaly Marteney Meredith Leeds, MD;  Location: [facility];  Service: Cardiovascular;  Laterality: N/A;  . PROSTATE SURGERY  2008     Current Outpatient Prescriptions  Medication Sig Dispense Refill  . aspirin 81 MG tablet Take 81 mg by mouth daily.    Marland Kitchen atorvastatin (LIPITOR) 80 MG tablet Take 80 mg by mouth daily.    Marland Kitchen levothyroxine (SYNTHROID, LEVOTHROID) 75 MCG tablet Take 75 mcg by mouth daily before breakfast.    . metoprolol (LOPRESSOR) 100 MG tablet Take 100 mg by mouth 2 (two) times daily.    . sodium-potassium bicarbonate (ALKA-SELTZER GOLD) TBEF dissolvable tablet Take 2 tablets by mouth 2 (two) times daily as needed (sour stomach).    Marland Kitchen  Turmeric 450 MG CAPS Take 450 mg by mouth daily.     No current facility-administered medications for this visit.     Allergies:   Scallops [shellfish allergy] and Fluzone [flu virus vaccine]   Social History:  The patient  reports that he quit smoking about 4 months ago. His smoking use included Cigarettes. He smoked 0.50 packs per day. He has never used smokeless tobacco. He reports that he drinks alcohol. He  reports that he does not use drugs.   Family History:  The patient's family history includes Dementia in his mother; Diabetes in his sister.    ROS:  Please see the history of present illness.   Otherwise, review of systems is positive for none.   All other systems are reviewed and negative.    PHYSICAL EXAM: VS:  BP (!) 148/86   Pulse 69   Ht 5\' 7"  (1.702 m)   Wt 247 lb 9.6 oz (112.3 kg)   BMI 38.78 kg/m  , BMI Body mass index is 38.78 kg/m. GEN: Well nourished, well developed, in no acute distress  HEENT: normal  Neck: no JVD, carotid bruits, or masses Cardiac: RRR; no murmurs, rubs, or gallops,no edema  Respiratory:  clear to auscultation bilaterally, normal work of breathing GI: soft, nontender, nondistended, + BS MS: no deformity or atrophy  Skin: warm and dry Neuro:  Strength and sensation are intact Psych: euthymic mood, full affect  EKG:  EKG is ordered today. Personal review of the ekg ordered today shows sinus rhythm, rate 69, lateral TWI  Recent Labs: 05/05/2016: B Natriuretic Peptide 90.3 08/23/2016: ALT 29; BUN 24; Creatinine, Ser 1.51; Hemoglobin 16.4; Magnesium 1.9; Platelets 214; Potassium 4.2; Sodium 135    Lipid Panel  No results found for: CHOL, TRIG, HDL, CHOLHDL, VLDL, LDLCALC, LDLDIRECT   Wt Readings from Last 3 Encounters:  09/26/16 247 lb 9.6 oz (112.3 kg)  08/24/16 248 lb 6.4 oz (112.7 kg)  08/23/16 225 lb (102.1 kg)      Other studies Reviewed: Additional studies/ records that were reviewed today include: Cardiac cath 05/08/16  Review of the above records today demonstrates:  1. LM lesion, 85% stenosed. 2. Ost 1st Mrg lesion, 100% stenosed. 3. SVG was injected . 4. Ost RCA to Prox RCA lesion, 100% stenosed. 5. SVG . 6. Origin to Prox Graft lesion, 40% stenosed. 7. Mid RCA to Dist RCA lesion, 100% stenosed. 8. Prox LAD lesion, 100% stenosed. 9. LIMA .    Patent saphenous vein grafts. SVG to PDA contains eccentric 40% proximal stenosis.  SVG to the circumflex is widely patent.  LIMA to the LAD is widely patent.  Native distal left main contains 75% stenosis. The dominant obtuse marginal branch is totally occluded. The native right coronary is totally occluded.  Left ventriculogram demonstrates inferior wall hypokinesis. EF 50%.  Recent chest pain in the setting of PSVT with minimal enzyme elevation likely related to demand ischemia in the setting of underlying native coronary disease.   ASSESSMENT AND PLAN:  1.  SVT:  Ablation 08/24/16. Has remained in sinus rhythm without any major complaints. EKG today shows sinus rhythm with T-wave inversions, but no current chest pain.  2. OSA: Wears BiPAP inconsistently. Encouraged consistent treatment  3. CAD: s/p 3V CABG 2015.   4. Hyperlipidemia: on lipitor    Current medicines are reviewed at length with the patient today.   The patient does not have concerns regarding his medicines.  The following changes were made today:  none  Labs/ tests ordered today include:  No orders of the defined types were placed in this encounter.    Disposition:   FU with Francina Beery PRN  Signed, Laron Angelini Meredith Leeds, MD  09/26/2016 11:47 AM     [address] [place] [address] [phone] (office) [phone] (fax)

## 2016-09-26 ENCOUNTER — Encounter: Payer: Self-pay | Admitting: Cardiology

## 2016-09-26 ENCOUNTER — Encounter (INDEPENDENT_AMBULATORY_CARE_PROVIDER_SITE_OTHER): Payer: Self-pay

## 2016-09-26 ENCOUNTER — Ambulatory Visit (INDEPENDENT_AMBULATORY_CARE_PROVIDER_SITE_OTHER): Payer: Federal, State, Local not specified - PPO | Admitting: Cardiology

## 2016-09-26 VITALS — BP 148/86 | HR 69 | Ht 67.0 in | Wt 247.6 lb

## 2016-09-26 DIAGNOSIS — I471 Supraventricular tachycardia: Secondary | ICD-10-CM | POA: Diagnosis not present

## 2016-09-26 NOTE — Patient Instructions (Signed)
Medication Instructions:  Your physician recommends that you continue on your current medications as directed. Please refer to the Current Medication list given to you today.  * If you need a refill on your cardiac medications before your next appointment, please call your pharmacy.   Labwork: None ordered  Testing/Procedures: None ordered  Follow-Up: No follow up is needed at this time with Dr. Camnitz.  He will see you on an as needed basis.   Thank you for choosing [facility]!!   Monike Bragdon, RN [phone]     

## 2016-11-01 ENCOUNTER — Ambulatory Visit: Payer: Federal, State, Local not specified - PPO | Admitting: Cardiology

## 2016-11-04 DIAGNOSIS — L089 Local infection of the skin and subcutaneous tissue, unspecified: Secondary | ICD-10-CM | POA: Diagnosis not present

## 2016-12-21 DIAGNOSIS — B029 Zoster without complications: Secondary | ICD-10-CM | POA: Diagnosis not present

## 2016-12-22 ENCOUNTER — Ambulatory Visit (HOSPITAL_COMMUNITY)
Admission: EM | Admit: 2016-12-22 | Discharge: 2016-12-22 | Discharge status: Absent without leave | Payer: Federal, State, Local not specified - PPO

## 2016-12-23 ENCOUNTER — Encounter (HOSPITAL_COMMUNITY): Payer: Self-pay | Admitting: *Deleted

## 2016-12-23 ENCOUNTER — Ambulatory Visit (HOSPITAL_COMMUNITY)
Admission: EM | Admit: 2016-12-23 | Discharge: 2016-12-23 | Disposition: A | Discharge status: Home / Self Care | Payer: Federal, State, Local not specified - PPO | Attending: Emergency Medicine | Admitting: Emergency Medicine

## 2016-12-23 DIAGNOSIS — L0291 Cutaneous abscess, unspecified: Secondary | ICD-10-CM | POA: Diagnosis not present

## 2016-12-23 MED ORDER — CEPHALEXIN 500 MG PO CAPS: 500.0000 mg | ORAL_CAPSULE | Freq: Four times a day (QID) | ORAL | 0 refills | Status: DC

## 2016-12-23 NOTE — ED Triage Notes (Signed)
Reports sustaining small laceration to right forehead approx 11 days ago.  Has been cleansing with H2O2 and applying Neosporin.  Over past few days wound has become more red, swollen, painful with purulent drainage.  Denies any fevers.

## 2016-12-23 NOTE — Discharge Instructions (Signed)
You developed a small abscess where you scraped your head. We drained it today. Take Keflex 4 times a day for 1 week. Wash the area with soap and water twice a day. Apply a small amount of Vaseline. Follow-up as needed.

## 2016-12-23 NOTE — ED Provider Notes (Signed)
[facility]    CSN: [id number] Arrival date & time: 12/23/16  1202     History   Chief Complaint Chief Complaint  Patient presents with  . Wound Check    HPI Elena Ruberg is a 71 y.o. male.   HPI He is a 71 year old man here for evaluation of right forehead abscess. He states about 10 or 11 days ago, he scraped the forehead. He quickly started developing some redness and swelling around the area. He has been using hydrogen peroxide and Neosporin without improvement. It is getting progressively larger and more tender. No fevers. He did have some fluid draining from it a few days ago.  Past Medical History:  Diagnosis Date  . A-fib ([place])   . CAD (coronary artery disease)    a. 2006: CABG in 2006 with LIMA-LAD, SVG-OM1, and SVG-RPDA  . Chronic pain 03/21/2016  . Essential hypertension 03/21/2016  . History of prostate cancer 03/21/2016  . HLD (hyperlipidemia)   . HTN (hypertension)   . Hx of CABG 2006  . Hypercholesteremia 03/21/2016  . Hypothyroidism   . Medication management 03/21/2016  . Morbid obesity ([place]) 03/21/2016  . Myocardial infarct   . OSA (obstructive sleep apnea) 03/21/2016  . Pain in right ankle and joints of right foot 03/21/2016  . Paronychia 03/21/2016  . Primary insomnia 03/21/2016  . Prostate cancer ([place]) 2008  . Tobacco dependence 03/21/2016    Patient Active Problem List   Diagnosis Date Noted  . AVNRT (AV nodal re-entry tachycardia) ([place]) 08/24/2016  . Chest pain 05/05/2016  . NSTEMI (non-ST elevated myocardial infarction) ([place]) 05/05/2016  . SVT (supraventricular tachycardia) ([place]) 03/21/2016  . S/P CABG x 3 03/21/2016  . Morbid obesity ([place]) 03/21/2016  . Essential hypertension 03/21/2016  . Hypercholesteremia 03/21/2016  . Paronychia 03/21/2016  . Tobacco dependence 03/21/2016  . Pain in right ankle and joints of right foot 03/21/2016  . Chronic pain 03/21/2016  . OSA (obstructive sleep apnea) 03/21/2016  . Medication management  03/21/2016  . History of prostate cancer 03/21/2016  . Primary insomnia 03/21/2016    Past Surgical History:  Procedure Laterality Date  . ANKLE SURGERY  12/2013  . CARDIAC CATHETERIZATION N/A 05/08/2016   Procedure: Left Heart Cath and Cors/Grafts Angiography;  Surgeon: Belva Crome, MD;  Location: [facility];  Service: Cardiovascular;  Laterality: N/A;  . CORONARY ARTERY BYPASS GRAFT  2006   x3  . ELECTROPHYSIOLOGIC STUDY N/A 08/24/2016   Procedure: SVT Ablation;  Surgeon: Will Meredith Leeds, MD;  Location: [facility];  Service: Cardiovascular;  Laterality: N/A;  . PROSTATE SURGERY  2008  . PTCA         Home Medications    Prior to Admission medications   Medication Sig Start Date End Date Taking? Authorizing Provider  aspirin 81 MG tablet Take 81 mg by mouth daily.   Yes Historical Provider, MD  atorvastatin (LIPITOR) 80 MG tablet Take 80 mg by mouth daily.   Yes Historical Provider, MD  levothyroxine (SYNTHROID, LEVOTHROID) 75 MCG tablet Take 75 mcg by mouth daily before breakfast.   Yes Historical Provider, MD  metoprolol (LOPRESSOR) 100 MG tablet Take 100 mg by mouth 2 (two) times daily.   Yes Historical Provider, MD  Turmeric 450 MG CAPS Take 450 mg by mouth daily.   Yes Historical Provider, MD  cephALEXin (KEFLEX) 500 MG capsule Take 1 capsule (500 mg total) by mouth 4 (four) times daily. 12/23/16   Erick Colace  Bridgett Larsson, MD  sodium-potassium bicarbonate (ALKA-SELTZER GOLD) TBEF dissolvable tablet Take 2 tablets by mouth 2 (two) times daily as needed (sour stomach).    Historical Provider, MD    Family History Family History  Problem Relation Age of Onset  . Dementia Mother   . Diabetes Sister     Social History Social History  Substance Use Topics  . Smoking status: Former Smoker    Packs/day: 0.50    Types: Cigarettes    Quit date: 05/26/2016  . Smokeless tobacco: Never Used  . Alcohol use Yes     Comment: very seldom     Allergies   Scallops  [shellfish allergy] and Fluzone [flu virus vaccine]   Review of Systems Review of Systems As in history of present illness  Physical Exam Triage Vital Signs ED Triage Vitals  Enc Vitals Group     BP 12/23/16 1229 155/91     Pulse Rate 12/23/16 1229 71     Resp 12/23/16 1229 18     Temp 12/23/16 1229 97.7 F (36.5 C)     Temp src --      SpO2 12/23/16 1229 94 %     Weight --      Height --      Head Circumference --      Peak Flow --      Pain Score 12/23/16 1232 7     Pain Loc --      Pain Edu? --      Excl. in [place]? --    No data found.   Updated Vital Signs BP 155/91   Pulse 71   Temp 97.7 F (36.5 C)   Resp 18   SpO2 94%   Visual Acuity Right Eye Distance:   Left Eye Distance:   Bilateral Distance:    Right Eye Near:   Left Eye Near:    Bilateral Near:     Physical Exam  Constitutional: He is oriented to person, place, and time. He appears well-developed and well-nourished. No distress.  Cardiovascular: Normal rate.   Pulmonary/Chest: Effort normal.  Neurological: He is alert and oriented to person, place, and time.  Skin:  1 cm tender nodule to right forehead. There is a central plug and some minimal fluctuance.     UC Treatments / Results  Labs (all labs ordered are listed, but only abnormal results are displayed) Labs Reviewed - No data to display  EKG  EKG Interpretation None       Radiology No results found.  Procedures .Marland KitchenIncision and Drainage Date/Time: 12/23/2016 1:00 PM Performed by: Melony Overly Authorized by: Melony Overly   Consent:    Consent obtained:  Verbal   Consent given by:  Patient Location:    Type:  Abscess   Size:  1cm   Location:  Head   Head/neck location: Right forehead. Pre-procedure details:    Procedure prep: Alcohol. Anesthesia (see MAR for exact dosages):    Anesthesia method:  Local infiltration   Local anesthetic:  Lidocaine 2% WITH epi Procedure details:    Incision types:  Stab incision    Incision depth:  Dermal   Scalpel blade:  11   Wound management:  Probed and deloculated   Drainage:  Purulent and bloody   Drainage amount:  Scant   Wound treatment:  Wound left open   Packing materials:  None Post-procedure details:    Patient tolerance of procedure:  Tolerated well, no immediate complications   (including critical  care time)  Medications Ordered in UC Medications - No data to display   Initial Impression / Assessment and Plan / UC Course  I have reviewed the triage vital signs and the nursing notes.  Pertinent labs & imaging results that were available during my care of the patient were reviewed by me and considered in my medical decision making (see chart for details).     Central plug removed. Will place on Keflex for 7 days. Wound care instructions given. Follow-up as needed.  Final Clinical Impressions(s) / UC Diagnoses   Final diagnoses:  Abscess    New Prescriptions New Prescriptions   CEPHALEXIN (KEFLEX) 500 MG CAPSULE    Take 1 capsule (500 mg total) by mouth 4 (four) times daily.     Melony Overly, MD 12/23/16 [phone]

## 2016-12-27 DIAGNOSIS — L814 Other melanin hyperpigmentation: Secondary | ICD-10-CM | POA: Diagnosis not present

## 2016-12-27 DIAGNOSIS — L0390 Cellulitis, unspecified: Secondary | ICD-10-CM | POA: Diagnosis not present

## 2016-12-27 DIAGNOSIS — D1801 Hemangioma of skin and subcutaneous tissue: Secondary | ICD-10-CM | POA: Diagnosis not present

## 2016-12-27 DIAGNOSIS — L821 Other seborrheic keratosis: Secondary | ICD-10-CM | POA: Diagnosis not present

## 2016-12-27 DIAGNOSIS — L570 Actinic keratosis: Secondary | ICD-10-CM | POA: Diagnosis not present

## 2016-12-27 DIAGNOSIS — L0291 Cutaneous abscess, unspecified: Secondary | ICD-10-CM | POA: Diagnosis not present

## 2017-01-02 ENCOUNTER — Encounter (HOSPITAL_COMMUNITY): Payer: Self-pay

## 2017-01-02 ENCOUNTER — Inpatient Hospital Stay (HOSPITAL_COMMUNITY)
Admission: EM | Admit: 2017-01-02 | Discharge: 2017-01-05 | DRG: 378 | Disposition: A | Discharge status: Home / Self Care | Payer: Federal, State, Local not specified - PPO | Attending: Internal Medicine | Admitting: Internal Medicine

## 2017-01-02 DIAGNOSIS — I252 Old myocardial infarction: Secondary | ICD-10-CM | POA: Diagnosis not present

## 2017-01-02 DIAGNOSIS — T07 Unspecified multiple injuries: Secondary | ICD-10-CM | POA: Diagnosis not present

## 2017-01-02 DIAGNOSIS — K921 Melena: Secondary | ICD-10-CM | POA: Diagnosis not present

## 2017-01-02 DIAGNOSIS — D62 Acute posthemorrhagic anemia: Secondary | ICD-10-CM | POA: Diagnosis not present

## 2017-01-02 DIAGNOSIS — E039 Hypothyroidism, unspecified: Secondary | ICD-10-CM | POA: Diagnosis not present

## 2017-01-02 DIAGNOSIS — K254 Chronic or unspecified gastric ulcer with hemorrhage: Principal | ICD-10-CM | POA: Diagnosis present

## 2017-01-02 DIAGNOSIS — I2510 Atherosclerotic heart disease of native coronary artery without angina pectoris: Secondary | ICD-10-CM | POA: Diagnosis present

## 2017-01-02 DIAGNOSIS — K219 Gastro-esophageal reflux disease without esophagitis: Secondary | ICD-10-CM | POA: Diagnosis present

## 2017-01-02 DIAGNOSIS — T39395A Adverse effect of other nonsteroidal anti-inflammatory drugs [NSAID], initial encounter: Secondary | ICD-10-CM | POA: Diagnosis not present

## 2017-01-02 DIAGNOSIS — I4891 Unspecified atrial fibrillation: Secondary | ICD-10-CM | POA: Diagnosis not present

## 2017-01-02 DIAGNOSIS — Z79899 Other long term (current) drug therapy: Secondary | ICD-10-CM

## 2017-01-02 DIAGNOSIS — A09 Infectious gastroenteritis and colitis, unspecified: Secondary | ICD-10-CM | POA: Diagnosis not present

## 2017-01-02 DIAGNOSIS — Z951 Presence of aortocoronary bypass graft: Secondary | ICD-10-CM | POA: Diagnosis not present

## 2017-01-02 DIAGNOSIS — K922 Gastrointestinal hemorrhage, unspecified: Secondary | ICD-10-CM | POA: Diagnosis present

## 2017-01-02 DIAGNOSIS — Z6838 Body mass index (BMI) 38.0-38.9, adult: Secondary | ICD-10-CM | POA: Diagnosis not present

## 2017-01-02 DIAGNOSIS — D649 Anemia, unspecified: Secondary | ICD-10-CM | POA: Diagnosis not present

## 2017-01-02 DIAGNOSIS — Z87891 Personal history of nicotine dependence: Secondary | ICD-10-CM | POA: Diagnosis not present

## 2017-01-02 DIAGNOSIS — I471 Supraventricular tachycardia, unspecified: Secondary | ICD-10-CM | POA: Diagnosis present

## 2017-01-02 DIAGNOSIS — R197 Diarrhea, unspecified: Secondary | ICD-10-CM

## 2017-01-02 DIAGNOSIS — I10 Essential (primary) hypertension: Secondary | ICD-10-CM | POA: Diagnosis not present

## 2017-01-02 DIAGNOSIS — Z8546 Personal history of malignant neoplasm of prostate: Secondary | ICD-10-CM

## 2017-01-02 DIAGNOSIS — G4733 Obstructive sleep apnea (adult) (pediatric): Secondary | ICD-10-CM | POA: Diagnosis present

## 2017-01-02 DIAGNOSIS — R059 Cough, unspecified: Secondary | ICD-10-CM

## 2017-01-02 DIAGNOSIS — R05 Cough: Secondary | ICD-10-CM | POA: Diagnosis not present

## 2017-01-02 DIAGNOSIS — I959 Hypotension, unspecified: Secondary | ICD-10-CM | POA: Diagnosis present

## 2017-01-02 DIAGNOSIS — R195 Other fecal abnormalities: Secondary | ICD-10-CM | POA: Diagnosis not present

## 2017-01-02 DIAGNOSIS — E785 Hyperlipidemia, unspecified: Secondary | ICD-10-CM | POA: Diagnosis not present

## 2017-01-02 DIAGNOSIS — A0472 Enterocolitis due to Clostridium difficile, not specified as recurrent: Secondary | ICD-10-CM | POA: Diagnosis present

## 2017-01-02 DIAGNOSIS — Z7982 Long term (current) use of aspirin: Secondary | ICD-10-CM | POA: Diagnosis not present

## 2017-01-02 DIAGNOSIS — J449 Chronic obstructive pulmonary disease, unspecified: Secondary | ICD-10-CM | POA: Diagnosis not present

## 2017-01-02 DIAGNOSIS — D509 Iron deficiency anemia, unspecified: Secondary | ICD-10-CM | POA: Diagnosis not present

## 2017-01-02 LAB — CBC
HCT: 24.1 % — ABNORMAL LOW (ref 39.0–52.0)
Hemoglobin: 7.8 g/dL — ABNORMAL LOW (ref 13.0–17.0)
MCH: 31.2 pg (ref 26.0–34.0)
MCHC: 32.4 g/dL (ref 30.0–36.0)
MCV: 96.4 fL (ref 78.0–100.0)
Platelets: 163 10*3/uL (ref 150–400)
RBC: 2.5 MIL/uL — AB (ref 4.22–5.81)
RDW: 15.2 % (ref 11.5–15.5)
WBC: 7.6 10*3/uL (ref 4.0–10.5)

## 2017-01-02 LAB — COMPREHENSIVE METABOLIC PANEL
ALT: 22 U/L (ref 17–63)
ANION GAP: 10 (ref 5–15)
AST: 25 U/L (ref 15–41)
Albumin: 3.2 g/dL — ABNORMAL LOW (ref 3.5–5.0)
Alkaline Phosphatase: 45 U/L (ref 38–126)
BUN: 56 mg/dL — ABNORMAL HIGH (ref 6–20)
CHLORIDE: 99 mmol/L — AB (ref 101–111)
CO2: 25 mmol/L (ref 22–32)
Calcium: 8.5 mg/dL — ABNORMAL LOW (ref 8.9–10.3)
Creatinine, Ser: 1.1 mg/dL (ref 0.61–1.24)
GFR calc non Af Amer: >60 mL/min (ref >60)
Glucose, Bld: 192 mg/dL — ABNORMAL HIGH (ref 65–99)
POTASSIUM: 3.6 mmol/L (ref 3.5–5.1)
SODIUM: 134 mmol/L — AB (ref 135–145)
Total Bilirubin: 0.5 mg/dL (ref 0.3–1.2)
Total Protein: 6.1 g/dL — ABNORMAL LOW (ref 6.5–8.1)

## 2017-01-02 LAB — TROPONIN I

## 2017-01-02 LAB — POC OCCULT BLOOD, ED: FECAL OCCULT BLD: POSITIVE — AB

## 2017-01-02 LAB — LIPASE, BLOOD: LIPASE: 33 U/L (ref 11–51)

## 2017-01-02 MED ORDER — SODIUM CHLORIDE 0.9 % IV BOLUS (SEPSIS)
1000.0000 mL | Freq: Once | INTRAVENOUS | Status: AC
  Administered 2017-01-02: 1000 mL via INTRAVENOUS

## 2017-01-02 MED ORDER — ONDANSETRON 4 MG PO TBDP
4.0000 mg | ORAL_TABLET | Freq: Once | ORAL | Status: AC | PRN
  Administered 2017-01-02: 4 mg via ORAL

## 2017-01-02 MED ORDER — ONDANSETRON 4 MG PO TBDP
ORAL_TABLET | ORAL | Status: AC
  Filled 2017-01-02: qty 1

## 2017-01-02 NOTE — ED Provider Notes (Addendum)
[facility] DEPT Provider Note   CSN: [id number] Arrival date & time: 01/02/17 2049     History    Chief Complaint  Patient presents with  . Melena  . Diarrhea     HPI Jeremiah [name] is a 71 y.o. male.  71yo M w/ extensive PMH including CABG, A fib, HTN, prostate CA who p/w diarrhea and black stools. On 1/21, the patient was seen in urgent care for a small wound infection. It was drained and he was given a seven-day course of Keflex. He was later seen by his dermatologist and antibiotics were switched to Bactrim which he started on 1/25. 3 days ago on 1/28, the patient began having diarrhea which was initially dark brown and then turned black and has continued to be black since it began. Last episode was when he left the house to come to the ED. He reports generalized fatigue and lightheadedness worse with exertion. He reports some mild dyspnea on exertion. He endorses nausea. No abdominal pain or vomiting. No chest pain. No history of GI bleed. He has had cough/cold symptoms recently with no fevers. He denies any sick contacts or international travel. No anticoagulant use.   Past Medical History:  Diagnosis Date  . A-fib ([place])   . CAD (coronary artery disease)    a. 2006: CABG in 2006 with LIMA-LAD, SVG-OM1, and SVG-RPDA  . Chronic pain 03/21/2016  . Essential hypertension 03/21/2016  . History of prostate cancer 03/21/2016  . HLD (hyperlipidemia)   . HTN (hypertension)   . Hx of CABG 2006  . Hypercholesteremia 03/21/2016  . Hypothyroidism   . Medication management 03/21/2016  . Morbid obesity ([place]) 03/21/2016  . Myocardial infarct   . OSA (obstructive sleep apnea) 03/21/2016  . Pain in right ankle and joints of right foot 03/21/2016  . Paronychia 03/21/2016  . Primary insomnia 03/21/2016  . Prostate cancer ([place]) 2008  . Tobacco dependence 03/21/2016     Patient Active Problem List   Diagnosis Date Noted  . GI bleed 01/03/2017  . AVNRT (AV nodal re-entry tachycardia) ([place])  08/24/2016  . Chest pain 05/05/2016  . NSTEMI (non-ST elevated myocardial infarction) ([place]) 05/05/2016  . SVT (supraventricular tachycardia) ([place]) 03/21/2016  . S/P CABG x 3 03/21/2016  . Morbid obesity ([place]) 03/21/2016  . Essential hypertension 03/21/2016  . Hypercholesteremia 03/21/2016  . Paronychia 03/21/2016  . Tobacco dependence 03/21/2016  . Pain in right ankle and joints of right foot 03/21/2016  . Chronic pain 03/21/2016  . OSA (obstructive sleep apnea) 03/21/2016  . Medication management 03/21/2016  . History of prostate cancer 03/21/2016  . Primary insomnia 03/21/2016    Past Surgical History:  Procedure Laterality Date  . ANKLE SURGERY  12/2013  . CARDIAC CATHETERIZATION N/A 05/08/2016   Procedure: Left Heart Cath and Cors/Grafts Angiography;  Surgeon: Belva Crome, MD;  Location: [facility];  Service: Cardiovascular;  Laterality: N/A;  . CORONARY ARTERY BYPASS GRAFT  2006   x3  . ELECTROPHYSIOLOGIC STUDY N/A 08/24/2016   Procedure: SVT Ablation;  Surgeon: Will Meredith Leeds, MD;  Location: [facility];  Service: Cardiovascular;  Laterality: N/A;  . PROSTATE SURGERY  2008  . PTCA          Home Medications    Prior to Admission medications   Medication Sig Start Date End Date Taking? Authorizing Provider  aspirin 81 MG tablet Take 81 mg by mouth daily.    Historical Provider, MD  atorvastatin (LIPITOR) 80 MG  tablet Take 80 mg by mouth daily.    Historical Provider, MD  cephALEXin (KEFLEX) 500 MG capsule Take 1 capsule (500 mg total) by mouth 4 (four) times daily. 12/23/16   Melony Overly, MD  levothyroxine (SYNTHROID, LEVOTHROID) 75 MCG tablet Take 75 mcg by mouth daily before breakfast.    Historical Provider, MD  metoprolol (LOPRESSOR) 100 MG tablet Take 100 mg by mouth 2 (two) times daily.    Historical Provider, MD  sodium-potassium bicarbonate (ALKA-SELTZER GOLD) TBEF dissolvable tablet Take 2 tablets by mouth 2 (two) times daily as needed (sour  stomach).    Historical Provider, MD  Turmeric 450 MG CAPS Take 450 mg by mouth daily.    Historical Provider, MD      Family History  Problem Relation Age of Onset  . Dementia Mother   . Diabetes Sister      Social History  Substance Use Topics  . Smoking status: Former Smoker    Packs/day: 0.50    Types: Cigarettes    Quit date: 05/26/2016  . Smokeless tobacco: Never Used  . Alcohol use Yes     Comment: very seldom     Allergies     Scallops [shellfish allergy] and Fluzone [flu virus vaccine]    Review of Systems  10 Systems reviewed and are negative for acute change except as noted in the HPI.   Physical Exam Updated Vital Signs BP (!) 92/43   Pulse 105   Temp 97.7 F (36.5 C) (Oral)   Resp 26   Ht 5\' 7"  (1.702 m)   Wt 245 lb (111.1 kg)   SpO2 94%   BMI 38.37 kg/m   Physical Exam  Constitutional: He is oriented to person, place, and time. He appears well-developed and well-nourished. No distress.  HENT:  Head: Normocephalic and atraumatic.  Dry mucous membranes  Eyes: Pupils are equal, round, and reactive to light.  Pale conjunctivae  Neck: Neck supple.  Cardiovascular: Regular rhythm and normal heart sounds.  Tachycardia present.   No murmur heard. Pulmonary/Chest: Effort normal and breath sounds normal.  Abdominal: Soft. Bowel sounds are normal. He exhibits no distension. There is no tenderness.  Genitourinary: Rectum normal.  Genitourinary Comments: Scant amount of melanotic stool in rectal vault  Musculoskeletal: He exhibits no edema.  Neurological: He is alert and oriented to person, place, and time.  Fluent speech  Skin: Skin is warm and dry. There is pallor.  Psychiatric: He has a normal mood and affect. Judgment normal.  Nursing note and vitals reviewed. Chaperone was present during exam.     ED Treatments / Results  Labs (all labs ordered are listed, but only abnormal results are displayed) Labs Reviewed  COMPREHENSIVE  METABOLIC PANEL - Abnormal; Notable for the following:       Result Value   Sodium 134 (*)    Chloride 99 (*)    Glucose, Bld 192 (*)    BUN 56 (*)    Calcium 8.5 (*)    Total Protein 6.1 (*)    Albumin 3.2 (*)    All other components within normal limits  CBC - Abnormal; Notable for the following:    RBC 2.50 (*)    Hemoglobin 7.8 (*)    HCT 24.1 (*)    All other components within normal limits  POC OCCULT BLOOD, ED - Abnormal; Notable for the following:    Fecal Occult Bld POSITIVE (*)    All other components within normal limits  C  DIFFICILE QUICK SCREEN W PCR REFLEX  LIPASE, BLOOD  TROPONIN I  URINALYSIS, ROUTINE W REFLEX MICROSCOPIC  TYPE AND SCREEN  PREPARE RBC (CROSSMATCH)     EKG  EKG Interpretation  Date/Time:  Wednesday January 02 2017 23:58:47 EST Ventricular Rate:  105 PR Interval:    QRS Duration: 90 QT Interval:  344 QTC Calculation: 455 R Axis:   49 Text Interpretation:  Sinus tachycardia Paired ventricular premature complexes Left atrial enlargement Abnormal T, consider ischemia, diffuse leads Baseline wander in lead(s) V6 No significant change since last tracing Confirmed by WARD,  DO, KRISTEN [id number]) on 01/03/2017 12:00:39 AM         Radiology No results found.  Procedures Procedures (including critical care time) .Critical Care Performed by: Sharlett Iles Authorized by: Sharlett Iles   Critical care provider statement:    Critical care time (minutes):  40   Critical care time was exclusive of:  Separately billable procedures and treating other patients   Critical care was necessary to treat or prevent imminent or life-threatening deterioration of the following conditions: GI bleed.   Critical care was time spent personally by me on the following activities:  Development of treatment plan with patient or surrogate, evaluation of patient's response to treatment, examination of patient, obtaining history from patient or  surrogate, ordering and performing treatments and interventions, ordering and review of laboratory studies, ordering and review of radiographic studies, re-evaluation of patient's condition and review of old charts    Medications Ordered in ED  Medications  ondansetron (ZOFRAN-ODT) 4 MG disintegrating tablet (not administered)  pantoprazole (PROTONIX) injection 40 mg (not administered)  0.9 %  sodium chloride infusion (not administered)  0.9 %  sodium chloride infusion (not administered)  ondansetron (ZOFRAN-ODT) disintegrating tablet 4 mg (4 mg Oral Given 01/02/17 2137)  sodium chloride 0.9 % bolus 1,000 mL (1,000 mLs Intravenous New Bag/Given 01/02/17 2313)     Initial Impression / Assessment and Plan / ED Course  I have reviewed the triage vital signs and the nursing notes.  Pertinent labs & imaging results that were available during my care of the patient were reviewed by me and considered in my medical decision making (see chart for details).    PT w/ 3 days of black diarrhea after recent keflex then bactrim use. He was pale and mildly ill-appearing but nontoxic and in no acute distress. He was tachycardic in the 110s but BP on my exam was normal at 117/76. No respiratory distress. He had no abdominal tenderness. Small amount of melena on exam. Obtained above lab work including type and screen. Labs show BUN elevated at 56, normal creatinine at 1.1, hemoglobin 7.8 which is significantly lower than his baseline of normal hemoglobin. Troponin normal. Added chest x-ray given his report of cough and congestion. I do feel that some of his tachycardia is related to dehydration in combination w/ blood loss. I have discussed admission with [facility] hospitalist, Dr. Loleta Books, who will admit the patient for further care. He will order blood products. The patient may ultimately require GI consult but I am awaiting C. difficile studies.   Final Clinical Impressions(s) / ED Diagnoses   Final diagnoses:    Melena  Diarrhea, unspecified type     New Prescriptions   No medications on file       Sharlett Iles, MD 01/03/17 0016    Sharlett Iles, MD 01/03/17 [id number]

## 2017-01-02 NOTE — ED Notes (Signed)
Pt had episode of nausea/gagging while in waiting room.

## 2017-01-02 NOTE — ED Triage Notes (Addendum)
Pt here for diarrhea and dehydration. He reports he had an abscess to his right forehead drained last week and has been taking Bactrim. Since Sunday he has started having diarrhea and then noticed his stools turned black. He is still having diarrhea.

## 2017-01-02 NOTE — ED Notes (Signed)
Attempted IV without success.

## 2017-01-02 NOTE — ED Notes (Signed)
IV team at the bedside. 

## 2017-01-03 ENCOUNTER — Inpatient Hospital Stay (HOSPITAL_COMMUNITY): Payer: Federal, State, Local not specified - PPO

## 2017-01-03 ENCOUNTER — Encounter (HOSPITAL_COMMUNITY): Payer: Self-pay | Admitting: Family Medicine

## 2017-01-03 DIAGNOSIS — Z79899 Other long term (current) drug therapy: Secondary | ICD-10-CM | POA: Diagnosis not present

## 2017-01-03 DIAGNOSIS — I471 Supraventricular tachycardia: Secondary | ICD-10-CM | POA: Diagnosis present

## 2017-01-03 DIAGNOSIS — A0472 Enterocolitis due to Clostridium difficile, not specified as recurrent: Secondary | ICD-10-CM | POA: Diagnosis present

## 2017-01-03 DIAGNOSIS — K921 Melena: Secondary | ICD-10-CM | POA: Diagnosis not present

## 2017-01-03 DIAGNOSIS — E039 Hypothyroidism, unspecified: Secondary | ICD-10-CM

## 2017-01-03 DIAGNOSIS — D649 Anemia, unspecified: Secondary | ICD-10-CM | POA: Diagnosis not present

## 2017-01-03 DIAGNOSIS — I4891 Unspecified atrial fibrillation: Secondary | ICD-10-CM | POA: Diagnosis present

## 2017-01-03 DIAGNOSIS — I10 Essential (primary) hypertension: Secondary | ICD-10-CM

## 2017-01-03 DIAGNOSIS — K254 Chronic or unspecified gastric ulcer with hemorrhage: Secondary | ICD-10-CM | POA: Diagnosis present

## 2017-01-03 DIAGNOSIS — I2510 Atherosclerotic heart disease of native coronary artery without angina pectoris: Secondary | ICD-10-CM | POA: Diagnosis present

## 2017-01-03 DIAGNOSIS — E785 Hyperlipidemia, unspecified: Secondary | ICD-10-CM | POA: Diagnosis present

## 2017-01-03 DIAGNOSIS — A09 Infectious gastroenteritis and colitis, unspecified: Secondary | ICD-10-CM | POA: Diagnosis not present

## 2017-01-03 DIAGNOSIS — R05 Cough: Secondary | ICD-10-CM | POA: Diagnosis not present

## 2017-01-03 DIAGNOSIS — I252 Old myocardial infarction: Secondary | ICD-10-CM | POA: Diagnosis not present

## 2017-01-03 DIAGNOSIS — R195 Other fecal abnormalities: Secondary | ICD-10-CM | POA: Diagnosis not present

## 2017-01-03 DIAGNOSIS — K922 Gastrointestinal hemorrhage, unspecified: Secondary | ICD-10-CM | POA: Diagnosis present

## 2017-01-03 DIAGNOSIS — Z951 Presence of aortocoronary bypass graft: Secondary | ICD-10-CM | POA: Diagnosis not present

## 2017-01-03 DIAGNOSIS — R197 Diarrhea, unspecified: Secondary | ICD-10-CM | POA: Diagnosis not present

## 2017-01-03 DIAGNOSIS — G4733 Obstructive sleep apnea (adult) (pediatric): Secondary | ICD-10-CM | POA: Diagnosis present

## 2017-01-03 DIAGNOSIS — R059 Cough, unspecified: Secondary | ICD-10-CM

## 2017-01-03 DIAGNOSIS — Z6838 Body mass index (BMI) 38.0-38.9, adult: Secondary | ICD-10-CM | POA: Diagnosis not present

## 2017-01-03 DIAGNOSIS — Z7982 Long term (current) use of aspirin: Secondary | ICD-10-CM | POA: Diagnosis not present

## 2017-01-03 DIAGNOSIS — Z87891 Personal history of nicotine dependence: Secondary | ICD-10-CM | POA: Diagnosis not present

## 2017-01-03 DIAGNOSIS — D509 Iron deficiency anemia, unspecified: Secondary | ICD-10-CM | POA: Diagnosis not present

## 2017-01-03 DIAGNOSIS — I959 Hypotension, unspecified: Secondary | ICD-10-CM | POA: Diagnosis present

## 2017-01-03 DIAGNOSIS — T39395A Adverse effect of other nonsteroidal anti-inflammatory drugs [NSAID], initial encounter: Secondary | ICD-10-CM | POA: Diagnosis present

## 2017-01-03 DIAGNOSIS — J449 Chronic obstructive pulmonary disease, unspecified: Secondary | ICD-10-CM | POA: Diagnosis not present

## 2017-01-03 DIAGNOSIS — K219 Gastro-esophageal reflux disease without esophagitis: Secondary | ICD-10-CM | POA: Diagnosis present

## 2017-01-03 DIAGNOSIS — Z8546 Personal history of malignant neoplasm of prostate: Secondary | ICD-10-CM | POA: Diagnosis not present

## 2017-01-03 DIAGNOSIS — D62 Acute posthemorrhagic anemia: Secondary | ICD-10-CM | POA: Diagnosis present

## 2017-01-03 LAB — HEMOGLOBIN AND HEMATOCRIT, BLOOD
HEMATOCRIT: 25.9 % — AB (ref 39.0–52.0)
HEMOGLOBIN: 8.4 g/dL — AB (ref 13.0–17.0)

## 2017-01-03 LAB — CBC
HCT: 24.9 % — ABNORMAL LOW (ref 39.0–52.0)
HEMOGLOBIN: 8.2 g/dL — AB (ref 13.0–17.0)
MCH: 31.9 pg (ref 26.0–34.0)
MCHC: 32.9 g/dL (ref 30.0–36.0)
MCV: 96.9 fL (ref 78.0–100.0)
PLATELETS: 121 10*3/uL — AB (ref 150–400)
RBC: 2.57 MIL/uL — AB (ref 4.22–5.81)
RDW: 16.6 % — ABNORMAL HIGH (ref 11.5–15.5)
WBC: 7.3 10*3/uL (ref 4.0–10.5)

## 2017-01-03 LAB — PREPARE RBC (CROSSMATCH)

## 2017-01-03 LAB — MRSA PCR SCREENING: MRSA by PCR: NEGATIVE

## 2017-01-03 MED ORDER — ONDANSETRON HCL 4 MG/2ML IJ SOLN: 4.0000 mg | Freq: Four times a day (QID) | INTRAMUSCULAR | Status: DC | PRN

## 2017-01-03 MED ORDER — ONDANSETRON HCL 4 MG PO TABS: 4.0000 mg | ORAL_TABLET | Freq: Four times a day (QID) | ORAL | Status: DC | PRN

## 2017-01-03 MED ORDER — SODIUM CHLORIDE 0.9 % IV SOLN: INTRAVENOUS | Status: DC

## 2017-01-03 MED ORDER — SODIUM CHLORIDE 0.9 % IV SOLN: Freq: Once | INTRAVENOUS | Status: AC

## 2017-01-03 MED ORDER — SODIUM CHLORIDE 0.9 % IV SOLN
INTRAVENOUS | Status: DC
  Administered 2017-01-03: 12:00:00 via INTRAVENOUS

## 2017-01-03 MED ORDER — LEVOTHYROXINE SODIUM 75 MCG PO TABS
75.0000 ug | ORAL_TABLET | Freq: Every day | ORAL | Status: DC
  Administered 2017-01-03 – 2017-01-05 (×3): 75 ug via ORAL
  Filled 2017-01-03 (×3): qty 1

## 2017-01-03 MED ORDER — SODIUM CHLORIDE 0.9 % IV SOLN
INTRAVENOUS | Status: DC
  Administered 2017-01-03: 05:00:00 via INTRAVENOUS

## 2017-01-03 MED ORDER — SODIUM CHLORIDE 0.9 % IV SOLN
Freq: Once | INTRAVENOUS | Status: AC
  Administered 2017-01-03: 13:00:00 via INTRAVENOUS

## 2017-01-03 MED ORDER — ACETAMINOPHEN 650 MG RE SUPP: 650.0000 mg | Freq: Four times a day (QID) | RECTAL | Status: DC | PRN

## 2017-01-03 MED ORDER — ATORVASTATIN CALCIUM 80 MG PO TABS
80.0000 mg | ORAL_TABLET | Freq: Every day | ORAL | Status: DC
  Administered 2017-01-03 – 2017-01-05 (×3): 80 mg via ORAL
  Filled 2017-01-03 (×3): qty 1

## 2017-01-03 MED ORDER — ACETAMINOPHEN 325 MG PO TABS: 650.0000 mg | ORAL_TABLET | Freq: Four times a day (QID) | ORAL | Status: DC | PRN

## 2017-01-03 MED ORDER — STERILE WATER FOR INJECTION IJ SOLN
INTRAMUSCULAR | Status: AC
  Filled 2017-01-03: qty 10

## 2017-01-03 MED ORDER — PANTOPRAZOLE SODIUM 40 MG IV SOLR
40.0000 mg | Freq: Two times a day (BID) | INTRAVENOUS | Status: DC
  Administered 2017-01-03 – 2017-01-05 (×6): 40 mg via INTRAVENOUS
  Filled 2017-01-03 (×6): qty 40

## 2017-01-03 NOTE — H&P (Signed)
History and Physical  Patient Name: Jeremiah [name]     [id number]    DOB: [birth year]    DOA: 01/02/2017 PCP: [name], MD   Patient coming from: Home  Chief Complaint: Weakness, dizziness, melena  HPI: Jeremiah [name] is a 71 y.o. male with a past medical history significant for CAD s/p CABG, SVT/AVNRT, OSA not on CPAP, and HTN who presents with melena and now dizziness.  The current episode started about a week or two ago when he developed a painful lump on his head.  He was taking ibuprofen and Aleve ("a lot" per his wife) and even had it I&D'd twice and placed on cephalexin changed to Bactrim by his PCP.  Then over the weekend, he started to have diarrhea, that turned black.  He thought it was from the antibiotic, so he stopped it immediately, started probiotic but the melena continued and then today and yesterday he developed new dizziness, weakness, and orthostatic symptoms.  Today this progressed until he was so weak he came to the ER.  ED course: -Afebrile, heart rate 130s, respirations and pulse is normal, but initial blood pressure 96/70 -Na 134, K 3.6, Cr 1.1 (baseline 1.1), WBC 7.6K, Hgb 7.8 (from 16 last Sept) -Lipase and troponin negative -FOBT+ and gross melena mixed with normal stool on rectal exam -He was given 1L NS and TRH were asked to evaluate for suspected Cdiff or other colitis  He has never had a GI bleed before.  He does not use alcohol.  He has no liver history.  He is on baby aspirin, no other blood thinners.  Has chronic GERD, takes Alkaseltzer every day.   ROS: Review of Systems  Constitutional: Negative for chills and fever.  Gastrointestinal: Positive for melena.  Neurological: Positive for dizziness and weakness.  All other systems reviewed and are negative.         Past Medical History:  Diagnosis Date  . A-fib ([place])   . CAD (coronary artery disease)    a. 2006: CABG in 2006 with LIMA-LAD, SVG-OM1, and SVG-RPDA  . Chronic pain 03/21/2016  .  Essential hypertension 03/21/2016  . History of prostate cancer 03/21/2016  . HLD (hyperlipidemia)   . HTN (hypertension)   . Hx of CABG 2006  . Hypercholesteremia 03/21/2016  . Hypothyroidism   . Medication management 03/21/2016  . Morbid obesity ([place]) 03/21/2016  . Myocardial infarct   . OSA (obstructive sleep apnea) 03/21/2016  . Pain in right ankle and joints of right foot 03/21/2016  . Paronychia 03/21/2016  . Primary insomnia 03/21/2016  . Prostate cancer ([place]) 2008  . Tobacco dependence 03/21/2016    Past Surgical History:  Procedure Laterality Date  . ANKLE SURGERY  12/2013  . CARDIAC CATHETERIZATION N/A 05/08/2016   Procedure: Left Heart Cath and Cors/Grafts Angiography;  Surgeon: Belva Crome, MD;  Location: [facility];  Service: Cardiovascular;  Laterality: N/A;  . CORONARY ARTERY BYPASS GRAFT  2006   x3  . ELECTROPHYSIOLOGIC STUDY N/A 08/24/2016   Procedure: SVT Ablation;  Surgeon: Will Meredith Leeds, MD;  Location: [facility];  Service: Cardiovascular;  Laterality: N/A;  . PROSTATE SURGERY  2008  . PTCA      Social History: Patient lives with his wife.  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  The patient walks [place].   reports that he quit smoking about 7 months ago. His smoking use included Cigarettes. He smoked 0.50 packs per day. He has never used  smokeless tobacco. He reports that he drinks alcohol. He reports that he does not use drugs.  Allergies  Allergen Reactions  . Scallops [Shellfish Allergy] Other (See Comments)    Rash; shortness of breath  . Fluzone [Flu Virus Vaccine] Nausea And Vomiting, Palpitations and Rash    Family history: family history includes Dementia in his mother; Diabetes in his sister.  Prior to Admission medications   Medication Sig Start Date End Date Taking? Authorizing Provider  aspirin 81 MG tablet Take 81 mg by mouth daily.    Historical Provider, MD  atorvastatin (LIPITOR) 80 MG tablet Take 80 mg by mouth daily.     Historical Provider, MD  cephALEXin (KEFLEX) 500 MG capsule Take 1 capsule (500 mg total) by mouth 4 (four) times daily. 12/23/16   Melony Overly, MD  levothyroxine (SYNTHROID, LEVOTHROID) 75 MCG tablet Take 75 mcg by mouth daily before breakfast.    Historical Provider, MD  metoprolol (LOPRESSOR) 100 MG tablet Take 100 mg by mouth 2 (two) times daily.    Historical Provider, MD  sodium-potassium bicarbonate (ALKA-SELTZER GOLD) TBEF dissolvable tablet Take 2 tablets by mouth 2 (two) times daily as needed (sour stomach).    Historical Provider, MD  Turmeric 450 MG CAPS Take 450 mg by mouth daily.    Historical Provider, MD       Physical Exam: BP (!) 92/43   Pulse 105   Temp 97.7 F (36.5 C) (Oral)   Resp 26   Ht 5\' 7"  (1.702 m)   Wt 111.1 kg (245 lb)   SpO2 94%   BMI 38.37 kg/m  General appearance: Well-developed, adult male, alert and in no acute distress.   Eyes: Anicteric, conjunctiva pink, lids and lashes normal. PERRL.    ENT: No nasal deformity, discharge, epistaxis.  Hearing normal. OP moist without lesions.   Neck: No neck masses.  Trachea midline.  No thyromegaly/tenderness. Lymph: No cervical or supraclavicular lymphadenopathy. Skin: Warm and dry.  No jaundice.  No suspicious rashes or lesions. Cardiac: RRR, nl S1-S2, no murmurs appreciated.  Capillary refill is brisk.  JVP normal.  No LE edema.  Radial and DP pulses 2+ and symmetric. Respiratory: Normal respiratory rate and rhythm.  CTAB without rales or wheezes. Abdomen: Abdomen soft.  No TTP. No ascites, distension, hepatosplenomegaly.   MSK: No deformities or effusions.  No cyanosis or clubbing. Neuro: Cranial nerves normal.  Sensation intact to light touch. Speech is fluent.  Muscle strength normal.    Psych: Sensorium intact and responding to questions, attention normal.  Behavior appropriate.  Affect normal.  Judgment and insight appear normal.     Labs on Admission:  I have personally reviewed following labs  and imaging studies: CBC:  Recent Labs Lab 01/02/17 2111  WBC 7.6  HGB 7.8*  HCT 24.1*  MCV 96.4  PLT [id number]   Basic Metabolic Panel:  Recent Labs Lab 01/02/17 2111  NA 134*  K 3.6  CL 99*  CO2 25  GLUCOSE 192*  BUN 56*  CREATININE 1.10  CALCIUM 8.5*   GFR: Estimated Creatinine Clearance: 75.4 mL/min (by C-G formula based on SCr of 1.1 mg/dL).  Liver Function Tests:  Recent Labs Lab 01/02/17 2111  AST 25  ALT 22  ALKPHOS 45  BILITOT 0.5  PROT 6.1*  ALBUMIN 3.2*    Recent Labs Lab 01/02/17 2111  LIPASE 33   No results for input(s): AMMONIA in the last 168 hours. Coagulation Profile: No results for input(s): INR, PROTIME  in the last 168 hours. Cardiac Enzymes:  Recent Labs Lab 01/02/17 2111  TROPONINI <0.03   BNP (last 3 results) No results for input(s): PROBNP in the last 8760 hours. HbA1C: No results for input(s): HGBA1C in the last 72 hours. CBG: No results for input(s): GLUCAP in the last 168 hours. Lipid Profile: No results for input(s): CHOL, HDL, LDLCALC, TRIG, CHOLHDL, LDLDIRECT in the last 72 hours. Thyroid Function Tests: No results for input(s): TSH, T4TOTAL, FREET4, T3FREE, THYROIDAB in the last 72 hours. Anemia Panel: No results for input(s): VITAMINB12, FOLATE, FERRITIN, TIBC, IRON, RETICCTPCT in the last 72 hours. Sepsis Labs:  Invalid input(s): PROCALCITONIN, LACTICIDVEN No results found for this or any previous visit (from the past 240 hour(s)).          Assessment/Plan  1. GI bleed:  Suspected upper GI bleed in setting of NSAID use and chronic GERD.   -PPI BID -NPO and MIVF -Transfuse 1 unit now and then trend CBC -Hold BP meds -Consult to GI, appreciate cares -Agree with EDP to rule out Cdiff colitis   2. Symptomatic anemia:  From GI blood loss. -Transfuse 1 unit then trend CBC  3. Hypertension:  -Hold antihypertensives until hemodynamics clearer -Continue statin, hold apsirin  4. Hypothyrodism:    -Continue levothyroxine  5. OSA:  Not on CPAP       DVT prophylaxis: SCDs  Code Status: FULL  Family Communication: Wife at bedside  Disposition Plan: Anticipate IV PPI, fluids, transfusion, trend CBC Consults called: GI, Dr. Benson Norway will see tomorrow Admission status: INPATIENT        Medical decision making: Patient seen at 12:32 AM on 01/03/2017.  The patient was discussed with Dr. Collene Mares and Dr. Rex Kras.  What exists of the patient's chart was reviewed in depth and summarized above.  Clinical condition: BP improved and HR improving, mentating well, no significant ongoing stool/melena in ER.        Edwin Dada [facility] Pager [phone]     At the time of admission, it appears that the appropriate admission status for this patient is INPATIENT. This is judged to be reasonable and necessary in order to provide the required intensity of service to ensure the patient's safety given the presenting symptoms, physical exam findings, and initial radiographic and laboratory data in the context of their chronic comorbidities.  Together, these circumstances are felt to place him at high risk for further clinical deterioration threatening life, limb, or organ.   Patient requires inpatient status due to high intensity of service, high risk for further deterioration and high frequency of surveillance required because of this acute illness that poses a threat to life, limb or bodily function.  Factors that support inpatient status include: evidence of GI bleed with hemoglobin 16 --> 7 and tachycardia HR 130s and BP initially less than [id number] systolic  I certify that at the point of admission it is my clinical judgment that the patient will require inpatient hospital care spanning beyond 2 midnights from the point of admission and that early discharge would result in unnecessary risk of decompensation and readmission or threat to life, limb or bodily function.

## 2017-01-03 NOTE — Care Management Note (Signed)
Case Management Note  Patient Details  Name: Lonza Methot MRN: [id number] Date of Birth: 19-Dec-1945  Subjective/Objective:   Presents with weakness, dizziness ,and melena, has recently had abscess on forehead I and D and was placed on abx by PCP, then he started having diarrhea, he thought is was due to abx so stopped the abx and started probiotic but melena continued.  He lives with his wife, has a PCP, has insurance, no problems getting medications and he has transportation at discharge.  NCM will cont to follow for dc needs.                Action/Plan:   Expected Discharge Date:                  Expected Discharge Plan:  Home/Self Care  In-House Referral:     Discharge planning Services  CM Consult  Post Acute Care Choice:    Choice offered to:     DME Arranged:    DME Agency:     HH Arranged:    HH Agency:     Status of Service:  In process, will continue to follow  If discussed at Long Length of Stay Meetings, dates discussed:    Additional Comments:  Zenon Mayo, RN 01/03/2017, 12:34 PM

## 2017-01-03 NOTE — Consult Note (Signed)
Reason for Consult: Melena and symptomatic anemia Referring Physician: [facility]  Jeremiah [name] HPI: This is a 71 year old male with a PMH of CAD s/p CABG, HTN, hyperlipidemia, morbid obesity, OSA, and prior tobacco abuse (quit one year ago) admitted with complaints of melena and weakness. Four days prior to admission he started to have issues with melena.  He denies this problem in the past and he denied any problems with nausea, vomiting, hematemesis, hematochezia, hemoptysis, hematuria, constipation, or abdominal pain.  He suffered a minor head injury recently and he was taking a significant amount of Aleve.  As a result of the melena he started to experience weakness, fatigue and dizziness.  In the ER he was mildly hypotensive, but he was tachycardic.  His HGB was at 7.8 g/dL and previously it was 16.4 g/dL on 08/23/2016.  No complaints of chest pain.  Past Medical History:  Diagnosis Date  . A-fib ([place])   . CAD (coronary artery disease)    a. 2006: CABG in 2006 with LIMA-LAD, SVG-OM1, and SVG-RPDA  . Chronic pain 03/21/2016  . Essential hypertension 03/21/2016  . History of prostate cancer 03/21/2016  . HLD (hyperlipidemia)   . HTN (hypertension)   . Hx of CABG 2006  . Hypercholesteremia 03/21/2016  . Hypothyroidism   . Medication management 03/21/2016  . Morbid obesity ([place]) 03/21/2016  . Myocardial infarct   . OSA (obstructive sleep apnea) 03/21/2016  . Pain in right ankle and joints of right foot 03/21/2016  . Paronychia 03/21/2016  . Primary insomnia 03/21/2016  . Prostate cancer ([place]) 2008  . Tobacco dependence 03/21/2016    Past Surgical History:  Procedure Laterality Date  . ANKLE SURGERY  12/2013  . CARDIAC CATHETERIZATION N/A 05/08/2016   Procedure: Left Heart Cath and Cors/Grafts Angiography;  Surgeon: Belva Crome, MD;  Location: [facility];  Service: Cardiovascular;  Laterality: N/A;  . CORONARY ARTERY BYPASS GRAFT  2006   x3  . ELECTROPHYSIOLOGIC STUDY N/A  08/24/2016   Procedure: SVT Ablation;  Surgeon: Will Meredith Leeds, MD;  Location: [facility];  Service: Cardiovascular;  Laterality: N/A;  . PROSTATE SURGERY  2008  . PTCA      Family History  Problem Relation Age of Onset  . Dementia Mother   . Diabetes Sister     Social History:  reports that he quit smoking about 7 months ago. His smoking use included Cigarettes. He smoked 0.50 packs per day. He has never used smokeless tobacco. He reports that he drinks alcohol. He reports that he does not use drugs.  Allergies:  Allergies  Allergen Reactions  . Scallops [Shellfish Allergy] Other (See Comments)    Rash; shortness of breath  . Fluzone [Flu Virus Vaccine] Nausea And Vomiting, Palpitations and Rash    Medications:  Scheduled: . sodium chloride   Intravenous Once  . atorvastatin  80 mg Oral Daily  . levothyroxine  75 mcg Oral QAC breakfast  . pantoprazole  40 mg Intravenous Q12H   Continuous: . sodium chloride 75 mL/hr at 01/03/17 1227    Results for orders placed or performed during the hospital encounter of 01/02/17 (from the past 24 hour(s))  Type and screen [place]     Status: None (Preliminary result)   Collection Time: 01/02/17  9:09 PM  Result Value Ref Range   ABO/RH(D) O POS    Antibody Screen POS    Sample Expiration 01/05/2017    Antibody Identification ANTI  K    PT AG Type NEGATIVE FOR KELL ANTIGEN    DAT, IgG NEG    Unit Number [id number]    Blood Component Type RBC LR PHER1    Unit division 00    Status of Unit ISSUED    Transfusion Status OK TO TRANSFUSE    Crossmatch Result COMPATIBLE    Donor AG Type NEGATIVE FOR KELL ANTIGEN    Unit Number [id number]    Blood Component Type RED CELLS,LR    Unit division 00    Status of Unit ALLOCATED    Transfusion Status OK TO TRANSFUSE    Crossmatch Result COMPATIBLE    Donor AG Type NEGATIVE FOR KELL ANTIGEN    Unit Number [id number]    Blood Component Type RED  CELLS,LR    Unit division 00    Status of Unit ALLOCATED    Donor AG Type NEGATIVE FOR KELL ANTIGEN    Transfusion Status OK TO TRANSFUSE    Crossmatch Result COMPATIBLE   Lipase, blood     Status: None   Collection Time: 01/02/17  9:11 PM  Result Value Ref Range   Lipase 33 11 - 51 U/L  Comprehensive metabolic panel     Status: Abnormal   Collection Time: 01/02/17  9:11 PM  Result Value Ref Range   Sodium 134 (L) 135 - 145 mmol/L   Potassium 3.6 3.5 - 5.1 mmol/L   Chloride 99 (L) 101 - 111 mmol/L   CO2 25 22 - 32 mmol/L   Glucose, Bld 192 (H) 65 - 99 mg/dL   BUN 56 (H) 6 - 20 mg/dL   Creatinine, Ser 1.10 0.61 - 1.24 mg/dL   Calcium 8.5 (L) 8.9 - 10.3 mg/dL   Total Protein 6.1 (L) 6.5 - 8.1 g/dL   Albumin 3.2 (L) 3.5 - 5.0 g/dL   AST 25 15 - 41 U/L   ALT 22 17 - 63 U/L   Alkaline Phosphatase 45 38 - 126 U/L   Total Bilirubin 0.5 0.3 - 1.2 mg/dL   GFR calc non Af Amer >60 >60 mL/min   GFR calc Af Amer >60 >60 mL/min   Anion gap 10 5 - 15  CBC     Status: Abnormal   Collection Time: 01/02/17  9:11 PM  Result Value Ref Range   WBC 7.6 4.0 - 10.5 K/uL   RBC 2.50 (L) 4.22 - 5.81 MIL/uL   Hemoglobin 7.8 (L) 13.0 - 17.0 g/dL   HCT 24.1 (L) 39.0 - 52.0 %   MCV 96.4 78.0 - 100.0 fL   MCH 31.2 26.0 - 34.0 pg   MCHC 32.4 30.0 - 36.0 g/dL   RDW 15.2 11.5 - 15.5 %   Platelets 163 150 - 400 K/uL  Troponin I     Status: None   Collection Time: 01/02/17  9:11 PM  Result Value Ref Range   Troponin I <0.03 <0.03 ng/mL  POC occult blood, ED     Status: Abnormal   Collection Time: 01/02/17 10:32 PM  Result Value Ref Range   Fecal Occult Bld POSITIVE (A) NEGATIVE  Prepare RBC     Status: None   Collection Time: 01/03/17 12:06 AM  Result Value Ref Range   Order Confirmation ORDER PROCESSED BY BLOOD BANK   MRSA PCR Screening     Status: None   Collection Time: 01/03/17  3:21 AM  Result Value Ref Range   MRSA by PCR NEGATIVE NEGATIVE  CBC  Status: Abnormal   Collection Time:  01/03/17  7:20 AM  Result Value Ref Range   WBC 7.3 4.0 - 10.5 K/uL   RBC 2.57 (L) 4.22 - 5.81 MIL/uL   Hemoglobin 8.2 (L) 13.0 - 17.0 g/dL   HCT 24.9 (L) 39.0 - 52.0 %   MCV 96.9 78.0 - 100.0 fL   MCH 31.9 26.0 - 34.0 pg   MCHC 32.9 30.0 - 36.0 g/dL   RDW 16.6 (H) 11.5 - 15.5 %   Platelets 121 (L) 150 - 400 K/uL  Prepare RBC     Status: None   Collection Time: 01/03/17 12:24 PM  Result Value Ref Range   Order Confirmation ORDER PROCESSED BY BLOOD BANK      Dg Chest Port 1 View  Result Date: 01/03/2017 CLINICAL DATA:  Cough for 4 days. EXAM: PORTABLE CHEST 1 VIEW COMPARISON:  Radiograph 08/23/2016 FINDINGS: Patient is post median sternotomy. Unchanged heart size and mediastinal contours. No confluent airspace disease to suggest pneumonia. Mild bibasilar atelectasis. No pulmonary edema or pleural fluid. No pneumothorax. Patient is rotated. IMPRESSION: Mild bibasilar atelectasis.  Otherwise unchanged exam. Electronically Signed   By: Jeb Levering M.D.   On: 01/03/2017 04:03    ROS:  As stated above in the HPI otherwise negative.  Blood pressure 119/66, pulse 96, temperature 98.2 F (36.8 C), temperature source Oral, resp. rate 19, height 5\' 7"  (1.702 m), weight 111.1 kg (245 lb), SpO2 95 %.    PE: Gen: NAD, Alert and Oriented HEENT:  [place]/AT, EOMI Neck: Supple, no LAD Lungs: CTA Bilaterally CV: RRR without M/G/R ABM: Soft, NTND, +BS Ext: No C/C/E  Assessment/Plan: 1) melena. 2) Anemia. 3) ? COPD.   Further evaluation with an EGD is necessary.  It will be performed with anesthesia.  I believe he has NSAID-induced gastric ulcers/erosions.  During my conversation he was pursing his lips with exhalation.  Previously he smoked heavily as an adolescent up until last year.  Plan: 1) EGD. 2) Avoid NSAIDs. 3) Follow HGB and transfuse as necessary. 4) PPI.  Salena Ortlieb D 01/03/2017, 1:46 PM

## 2017-01-03 NOTE — Progress Notes (Signed)
[facility]                                                                              Patient Demographics  Jeremiah [name], is a 71 y.o. male, DOB - Jun 23, 1946, [id number]  Admit date - 01/02/2017   Admitting Physician Edwin Dada, MD  Outpatient Primary MD for the patient is [name], MD  Outpatient specialists:   LOS - 0  days    Chief Complaint  Patient presents with  . Melena  . Diarrhea       Brief summary   Patient is a 71 year old male with CAD s/p CABG, SVT/AVNRT, OSA not on CPAP, and HTN who presents with melena and now dizziness. Patient reported that a week or 2 ago he had developed a painful lump in his head. He was taking ibuprofen and Aleve a lot for his wife and and even had it I&D'd twice and placed on cephalexin changed to Bactrim by his PCP. Over the weekend he started to have diarrhea that turned black. The melena continued and then he developed new dizziness, weakness and orthostatic symptoms. In ED, heart rate was 1:30, BP 96/70, hemoglobin 7.8, baseline hemoglobin 14-16. FOBT positive. He was admitted to stepdown unit.    Assessment & Plan    Principal Problem:   Acute blood loss anemia secondary to upper GI bleed:  - Suspected upper GI bleed in setting of NSAID use and chronic GERD.   -Continue IV PPI, patient was placed on  NPO status, IVF  - Transfused 1 unit packed RBCs, hemoglobin 8.2, given his history of CAD, CABG, still feeling weak and dizzy, will transfuse 1 more unit of packed RBCs. - Rule out C. Difficile - GI consulted, awaiting recommendations - Hold aspirin  Hypotension  - continue to hold antihypertensives, continue gentle hydration  Hypothyrodism:  -Continue levothyroxine  OSA:  Not on CPAP  CAD, history of CABG - Currently stable, no chest pain or shortness of breath - Hold aspirin, continue statin  Code Status: full code  DVT Prophylaxis:   SCD's Family Communication: Discussed in  detail with the patient, all imaging results, lab results explained to the patient  Disposition Plan: Pending endoscopy  Time Spent in minutes  25 minutes  Procedures:    Consultants:   GI   Antimicrobials:      Medications  Scheduled Meds: . sodium chloride   Intravenous Once  . atorvastatin  80 mg Oral Daily  . levothyroxine  75 mcg Oral QAC breakfast  . pantoprazole  40 mg Intravenous Q12H   Continuous Infusions: . sodium chloride 75 mL/hr at 01/03/17 1227   PRN Meds:.acetaminophen **OR** acetaminophen, ondansetron **OR** ondansetron (ZOFRAN) IV   Antibiotics   Anti-infectives    None        Subjective:   Jeremiah [name] was seen and examined today.  No complaints except feeling still weak. No bleeding since admission. Patient denies dizziness, chest pain, shortness of breath, abdominal pain, N/V/D/C. No acute events overnight.    Objective:   Vitals:   01/03/17 0141 01/03/17 0200 01/03/17 0814 01/03/17 1209  BP: 146/97 112/80 135/66 119/66  Pulse: 106 106 (!) 105 96  Resp: 20 18 14 19   Temp: 98.2 F (36.8 C) 97.9 F (36.6 C) 98.1 F (36.7 C) 98.2 F (36.8 C)  TempSrc: Oral Oral Oral Oral  SpO2: 94% 94% 99% 95%  Weight:      Height:        Intake/Output Summary (Last 24 hours) at 01/03/17 1255 Last data filed at 01/03/17 1000  Gross per 24 hour  Intake           627.08 ml  Output              375 ml  Net           252.08 ml     Wt Readings from Last 3 Encounters:  01/02/17 111.1 kg (245 lb)  09/26/16 112.3 kg (247 lb 9.6 oz)  08/24/16 112.7 kg (248 lb 6.4 oz)     Exam  General: Alert and oriented x 3, NAD  HEENT:  PERRLA, EOMI, Anicteric Sclera, mucous membranes moist.   Neck: Supple, no JVD, no masses  Cardiovascular: S1 S2 auscultated, no rubs, murmurs or gallops. Regular rate and rhythm.  Respiratory: Clear to auscultation bilaterally, no wheezing, rales or rhonchi  Gastrointestinal: obese, Soft, nontender, nondistended, +  bowel sounds  Ext: no cyanosis clubbing or edema  Neuro: AAOx3, Cr N's II- XII. Strength 5/5 upper and lower extremities bilaterally  Skin: No rashes  Psych: Normal affect and demeanor, alert and oriented x3    Data Reviewed:  I have personally reviewed following labs and imaging studies  Micro Results Recent Results (from the past 240 hour(s))  MRSA PCR Screening     Status: None   Collection Time: 01/03/17  3:21 AM  Result Value Ref Range Status   MRSA by PCR NEGATIVE NEGATIVE Final    Comment:        The GeneXpert MRSA Assay (FDA approved for NASAL specimens only), is one component of a comprehensive MRSA colonization surveillance program. It is not intended to diagnose MRSA infection nor to guide or monitor treatment for MRSA infections.     Radiology Reports Dg Chest Port 1 View  Result Date: 01/03/2017 CLINICAL DATA:  Cough for 4 days. EXAM: PORTABLE CHEST 1 VIEW COMPARISON:  Radiograph 08/23/2016 FINDINGS: Patient is post median sternotomy. Unchanged heart size and mediastinal contours. No confluent airspace disease to suggest pneumonia. Mild bibasilar atelectasis. No pulmonary edema or pleural fluid. No pneumothorax. Patient is rotated. IMPRESSION: Mild bibasilar atelectasis.  Otherwise unchanged exam. Electronically Signed   By: Jeb Levering M.D.   On: 01/03/2017 04:03    Lab Data:  CBC:  Recent Labs Lab 01/02/17 2111 01/03/17 0720  WBC 7.6 7.3  HGB 7.8* 8.2*  HCT 24.1* 24.9*  MCV 96.4 96.9  PLT 163 [id number]*   Basic Metabolic Panel:  Recent Labs Lab 01/02/17 2111  NA 134*  K 3.6  CL 99*  CO2 25  GLUCOSE 192*  BUN 56*  CREATININE 1.10  CALCIUM 8.5*   GFR: Estimated Creatinine Clearance: 75.4 mL/min (by C-G formula based on SCr of 1.1 mg/dL). Liver Function Tests:  Recent Labs Lab 01/02/17 2111  AST 25  ALT 22  ALKPHOS 45  BILITOT 0.5  PROT 6.1*  ALBUMIN 3.2*    Recent Labs Lab 01/02/17 2111  LIPASE 33   No results for  input(s): AMMONIA in the last 168 hours. Coagulation Profile: No results for input(s): INR, PROTIME in the last 168 hours. Cardiac Enzymes:  Recent  Labs Lab 01/02/17 2111  TROPONINI <0.03   BNP (last 3 results) No results for input(s): PROBNP in the last 8760 hours. HbA1C: No results for input(s): HGBA1C in the last 72 hours. CBG: No results for input(s): GLUCAP in the last 168 hours. Lipid Profile: No results for input(s): CHOL, HDL, LDLCALC, TRIG, CHOLHDL, LDLDIRECT in the last 72 hours. Thyroid Function Tests: No results for input(s): TSH, T4TOTAL, FREET4, T3FREE, THYROIDAB in the last 72 hours. Anemia Panel: No results for input(s): VITAMINB12, FOLATE, FERRITIN, TIBC, IRON, RETICCTPCT in the last 72 hours. Urine analysis: No results found for: COLORURINE, APPEARANCEUR, LABSPEC, PHURINE, GLUCOSEU, HGBUR, BILIRUBINUR, KETONESUR, PROTEINUR, UROBILINOGEN, NITRITE, Corliss Skains M.D. [facility] 01/03/2017, 12:55 PM  Pager: [phone] Between 7am to 7pm - call Pager - [phone]  After 7pm go to [email or web address] - password [email or web address]  Call night coverage person covering after 7pm

## 2017-01-04 ENCOUNTER — Encounter (HOSPITAL_COMMUNITY): Payer: Self-pay | Admitting: Anesthesiology

## 2017-01-04 ENCOUNTER — Encounter (HOSPITAL_COMMUNITY)
Admission: EM | Disposition: A | Discharge status: Home / Self Care | Payer: Self-pay | Source: Home / Self Care | Attending: Internal Medicine

## 2017-01-04 LAB — CBC
HCT: 25.5 % — ABNORMAL LOW (ref 39.0–52.0)
Hemoglobin: 8.3 g/dL — ABNORMAL LOW (ref 13.0–17.0)
MCH: 31.8 pg (ref 26.0–34.0)
MCHC: 32.5 g/dL (ref 30.0–36.0)
MCV: 97.7 fL (ref 78.0–100.0)
PLATELETS: 128 10*3/uL — AB (ref 150–400)
RBC: 2.61 MIL/uL — ABNORMAL LOW (ref 4.22–5.81)
RDW: 17 % — AB (ref 11.5–15.5)
WBC: 6.4 10*3/uL (ref 4.0–10.5)

## 2017-01-04 LAB — BRAIN NATRIURETIC PEPTIDE: B Natriuretic Peptide: 246.1 pg/mL — ABNORMAL HIGH (ref 0.0–100.0)

## 2017-01-04 LAB — BASIC METABOLIC PANEL
ANION GAP: 9 (ref 5–15)
BUN: 23 mg/dL — AB (ref 6–20)
CALCIUM: 8.1 mg/dL — AB (ref 8.9–10.3)
CO2: 28 mmol/L (ref 22–32)
Chloride: 100 mmol/L — ABNORMAL LOW (ref 101–111)
Creatinine, Ser: 0.77 mg/dL (ref 0.61–1.24)
GFR calc Af Amer: >60 mL/min (ref >60)
GLUCOSE: 116 mg/dL — AB (ref 65–99)
Potassium: 3.7 mmol/L (ref 3.5–5.1)
SODIUM: 137 mmol/L (ref 135–145)

## 2017-01-04 SURGERY — CANCELLED PROCEDURE
Anesthesia: Monitor Anesthesia Care

## 2017-01-04 MED ORDER — FUROSEMIDE 10 MG/ML IJ SOLN
40.0000 mg | Freq: Once | INTRAMUSCULAR | Status: AC
  Administered 2017-01-04: 40 mg via INTRAVENOUS
  Filled 2017-01-04: qty 4

## 2017-01-04 MED ORDER — IPRATROPIUM-ALBUTEROL 0.5-2.5 (3) MG/3ML IN SOLN
RESPIRATORY_TRACT | Status: AC
  Filled 2017-01-04: qty 3

## 2017-01-04 MED ORDER — LACTATED RINGERS IV SOLN
INTRAVENOUS | Status: DC
  Administered 2017-01-04: 1000 mL via INTRAVENOUS

## 2017-01-04 MED ORDER — SODIUM CHLORIDE 0.9 % IV SOLN: INTRAVENOUS | Status: DC

## 2017-01-04 NOTE — Progress Notes (Signed)
[facility]                                                                              Patient Demographics  Jeremiah [name], is a 71 y.o. male, DOB - Jul 18, 1946, [id number]  Admit date - 01/02/2017   Admitting Physician Edwin Dada, MD  Outpatient Primary MD for the patient is [name], MD  Outpatient specialists:   LOS - 1  days    Chief Complaint  Patient presents with  . Melena  . Diarrhea       Brief summary   Patient is a 71 year old male with CAD s/p CABG, SVT/AVNRT, OSA not on CPAP, and HTN who presents with melena and now dizziness. Patient reported that a week or 2 ago he had developed a painful lump in his head. He was taking ibuprofen and Aleve a lot for his wife and and even had it I&D'd twice and placed on cephalexin changed to Bactrim by his PCP. Over the weekend he started to have diarrhea that turned black. The melena continued and then he developed new dizziness, weakness and orthostatic symptoms. In ED, heart rate was 1:30, BP 96/70, hemoglobin 7.8, baseline hemoglobin 14-16. FOBT positive. He was admitted to stepdown unit.    Assessment & Plan    Principal Problem:   Acute blood loss anemia secondary to upper GI bleed:  - Suspected upper GI bleed in setting of NSAID use and chronic GERD.   -Continue IV PPI, patient was placed on  NPO status, IVF  - Status post 2 units packed RBCs, hemoglobin still 8.3  - Rule out C. Difficile - GI consulted, planned EGD today - Hold aspirin  Hypotension  - continue to hold antihypertensives, saline lock today  Hypothyrodism:  -Continue levothyroxine  OSA:  Not on CPAP  CAD, history of CABG - Currently stable, no chest pain or shortness of breath - Hold aspirin, continue statin  Code Status: full code  DVT Prophylaxis:   SCD's Family Communication: Discussed in detail with the patient, all imaging results, lab results explained to the patient  Disposition Plan: Pending  endoscopy  Time Spent in minutes  25 minutes  Procedures:    Consultants:   GI   Antimicrobials:      Medications  Scheduled Meds: . atorvastatin  80 mg Oral Daily  . levothyroxine  75 mcg Oral QAC breakfast  . pantoprazole  40 mg Intravenous Q12H   Continuous Infusions: . sodium chloride     PRN Meds:.acetaminophen **OR** acetaminophen, ondansetron **OR** ondansetron (ZOFRAN) IV   Antibiotics   Anti-infectives    None        Subjective:   Jeremiah [name] was seen and examined today.  No complaints today, awaiting EGD. No obvious GI bleeding.  Patient denies dizziness, chest pain, shortness of breath, abdominal pain, N/V/D/C. No acute events overnight.    Objective:   Vitals:   01/03/17 2351 01/04/17 0400 01/04/17 0800 01/04/17 1150  BP:  122/76 (!) 108/56 (!) 114/95  Pulse: 95  89 91  Resp: 14  20 20   Temp: 98.5 F (36.9 C) 98.4 F (36.9 C) 98.6 F (37  C) 98.8 F (37.1 C)  TempSrc: Oral Oral Oral Oral  SpO2: 97%  98% 90%  Weight:      Height:        Intake/Output Summary (Last 24 hours) at 01/04/17 1252 Last data filed at 01/04/17 1100  Gross per 24 hour  Intake          2716.25 ml  Output              950 ml  Net          1766.25 ml     Wt Readings from Last 3 Encounters:  01/02/17 111.1 kg (245 lb)  09/26/16 112.3 kg (247 lb 9.6 oz)  08/24/16 112.7 kg (248 lb 6.4 oz)     Exam  General: Alert and oriented x 3, NAD  HEENT:    Neck:   Cardiovascular: S1 S2clear, RRR  Respiratory: Clear to auscultation bilaterally, no wheezing, rales or rhonchi  Gastrointestinal: obese, Soft, nontender, nondistended, + bowel sounds  Ext: no cyanosis clubbing or edema  Neuro: no deficits  Skin: No rashes  Psych: Normal affect and demeanor, alert and oriented x3    Data Reviewed:  I have personally reviewed following labs and imaging studies  Micro Results Recent Results (from the past 240 hour(s))  MRSA PCR Screening     Status: None    Collection Time: 01/03/17  3:21 AM  Result Value Ref Range Status   MRSA by PCR NEGATIVE NEGATIVE Final    Comment:        The GeneXpert MRSA Assay (FDA approved for NASAL specimens only), is one component of a comprehensive MRSA colonization surveillance program. It is not intended to diagnose MRSA infection nor to guide or monitor treatment for MRSA infections.     Radiology Reports Dg Chest Port 1 View  Result Date: 01/03/2017 CLINICAL DATA:  Cough for 4 days. EXAM: PORTABLE CHEST 1 VIEW COMPARISON:  Radiograph 08/23/2016 FINDINGS: Patient is post median sternotomy. Unchanged heart size and mediastinal contours. No confluent airspace disease to suggest pneumonia. Mild bibasilar atelectasis. No pulmonary edema or pleural fluid. No pneumothorax. Patient is rotated. IMPRESSION: Mild bibasilar atelectasis.  Otherwise unchanged exam. Electronically Signed   By: Jeb Levering M.D.   On: 01/03/2017 04:03    Lab Data:  CBC:  Recent Labs Lab 01/02/17 2111 01/03/17 0720 01/03/17 1942 01/04/17 0337  WBC 7.6 7.3  --  6.4  HGB 7.8* 8.2* 8.4* 8.3*  HCT 24.1* 24.9* 25.9* 25.5*  MCV 96.4 96.9  --  97.7  PLT 163 121*  --  0000000*   Basic Metabolic Panel:  Recent Labs Lab 01/02/17 2111 01/04/17 0337  NA 134* 137  K 3.6 3.7  CL 99* 100*  CO2 25 28  GLUCOSE 192* 116*  BUN 56* 23*  CREATININE 1.10 0.77  CALCIUM 8.5* 8.1*   GFR: Estimated Creatinine Clearance: 103.7 mL/min (by C-G formula based on SCr of 0.77 mg/dL). Liver Function Tests:  Recent Labs Lab 01/02/17 2111  AST 25  ALT 22  ALKPHOS 45  BILITOT 0.5  PROT 6.1*  ALBUMIN 3.2*    Recent Labs Lab 01/02/17 2111  LIPASE 33   No results for input(s): AMMONIA in the last 168 hours. Coagulation Profile: No results for input(s): INR, PROTIME in the last 168 hours. Cardiac Enzymes:  Recent Labs Lab 01/02/17 2111  TROPONINI <0.03   BNP (last 3 results) No results for input(s): PROBNP in the last 8760  hours. HbA1C: No results for  input(s): HGBA1C in the last 72 hours. CBG: No results for input(s): GLUCAP in the last 168 hours. Lipid Profile: No results for input(s): CHOL, HDL, LDLCALC, TRIG, CHOLHDL, LDLDIRECT in the last 72 hours. Thyroid Function Tests: No results for input(s): TSH, T4TOTAL, FREET4, T3FREE, THYROIDAB in the last 72 hours. Anemia Panel: No results for input(s): VITAMINB12, FOLATE, FERRITIN, TIBC, IRON, RETICCTPCT in the last 72 hours. Urine analysis: No results found for: COLORURINE, APPEARANCEUR, LABSPEC, PHURINE, GLUCOSEU, HGBUR, BILIRUBINUR, KETONESUR, PROTEINUR, UROBILINOGEN, NITRITE, Corliss Skains M.D. [facility] 01/04/2017, 12:52 PM  Pager: [phone] Between 7am to 7pm - call Pager - [phone]  After 7pm go to [email or web address] - password [email or web address]  Call night coverage person covering after 7pm

## 2017-01-04 NOTE — Anesthesia Preprocedure Evaluation (Deleted)
Anesthesia Evaluation  Patient identified by MRN, date of birth, ID band Patient awake    Reviewed: Allergy & Precautions, NPO status , Patient's Chart, lab work & pertinent test results  Airway        Dental   Pulmonary sleep apnea , COPD, former smoker,           Cardiovascular hypertension, Pt. on medications and Pt. on home beta blockers + CAD, + Past MI and + CABG       Neuro/Psych negative neurological ROS     GI/Hepatic Neg liver ROS, Melena with anemia   Endo/Other  Hypothyroidism   Renal/GU negative Renal ROS     Musculoskeletal   Abdominal   Peds  Hematology  (+) anemia ,   Anesthesia Other Findings   Reproductive/Obstetrics                             Lab Results  Component Value Date   WBC 6.4 01/04/2017   HGB 8.3 (L) 01/04/2017   HCT 25.5 (L) 01/04/2017   MCV 97.7 01/04/2017   PLT 128 (L) 01/04/2017   Lab Results  Component Value Date   CREATININE 0.77 01/04/2017   BUN 23 (H) 01/04/2017   NA 137 01/04/2017   K 3.7 01/04/2017   CL 100 (L) 01/04/2017   CO2 28 01/04/2017    Anesthesia Physical Anesthesia Plan  ASA: IV  Anesthesia Plan: MAC   Post-op Pain Management:    Induction: Intravenous  Airway Management Planned: Natural Airway and Nasal Cannula  Additional Equipment:   Intra-op Plan:   Post-operative Plan:   Informed Consent: I have reviewed the patients History and Physical, chart, labs and discussed the procedure including the risks, benefits and alternatives for the proposed anesthesia with the patient or authorized representative who has indicated his/her understanding and acceptance.     Plan Discussed with: CRNA  Anesthesia Plan Comments: (Pt presented to endo with O2 sat of 85% on RA with pursed lips and accessory muscle use consistent with untreated COPD. Discussed with Dr. Benson Norway and patient. Since this is non-emergent and per Dr. Benson Norway  he will most likely get better with PPI and avoidance of NSAIDs case is cancelled as he will likely not tolerate sedation and GA has risk of prolonged intubation, and would benefit from treatment of COPD and cessation of smoking. Patient and Dr. Benson Norway in agreement. )       Anesthesia Quick Evaluation

## 2017-01-04 NOTE — Progress Notes (Signed)
Subjective: No complaints.  Objective: Vital signs in last 24 hours: Temp:  [97.5 F (36.4 C)-98.8 F (37.1 C)] 98.6 F (37 C) (02/02 1408) Pulse Rate:  [86-95] 86 (02/02 1408) Resp:  [14-20] 19 (02/02 1408) BP: (108-125)/(38-95) 122/64 (02/02 1408) SpO2:  [90 %-98 %] 94 % (02/02 1408) Last BM Date: 01/02/17  Intake/Output from previous day: 02/01 0701 - 02/02 0700 In: 2518.3 [P.O.:690; I.V.:1493.3; Blood:335] Out: 600 [Urine:600] Intake/Output this shift: Total I/O In: 1050 [I.V.:1050] Out: 350 [Urine:350]  General appearance: alert and no distress Resp: clear to auscultation bilaterally Cardio: regular rate and rhythm GI: soft, non-tender; bowel sounds normal; no masses,  no organomegaly  Lab Results:  Recent Labs  01/02/17 2111 01/03/17 0720 01/03/17 1942 01/04/17 0337  WBC 7.6 7.3  --  6.4  HGB 7.8* 8.2* 8.4* 8.3*  HCT 24.1* 24.9* 25.9* 25.5*  PLT 163 121*  --  128*   BMET  Recent Labs  01/02/17 2111 01/04/17 0337  NA 134* 137  K 3.6 3.7  CL 99* 100*  CO2 25 28  GLUCOSE 192* 116*  BUN 56* 23*  CREATININE 1.10 0.77  CALCIUM 8.5* 8.1*   LFT  Recent Labs  01/02/17 2111  PROT 6.1*  ALBUMIN 3.2*  AST 25  ALT 22  ALKPHOS 45  BILITOT 0.5   PT/INR No results for input(s): LABPROT, INR in the last 72 hours. Hepatitis Panel No results for input(s): HEPBSAG, HCVAB, HEPAIGM, HEPBIGM in the last 72 hours. C-Diff No results for input(s): CDIFFTOX in the last 72 hours. Fecal Lactopherrin No results for input(s): FECLLACTOFRN in the last 72 hours.  Studies/Results: Dg Chest Port 1 View  Result Date: 01/03/2017 CLINICAL DATA:  Cough for 4 days. EXAM: PORTABLE CHEST 1 VIEW COMPARISON:  Radiograph 08/23/2016 FINDINGS: Patient is post median sternotomy. Unchanged heart size and mediastinal contours. No confluent airspace disease to suggest pneumonia. Mild bibasilar atelectasis. No pulmonary edema or pleural fluid. No pneumothorax. Patient is rotated.  IMPRESSION: Mild bibasilar atelectasis.  Otherwise unchanged exam. Electronically Signed   By: Jeb Levering M.D.   On: 01/03/2017 04:03    Medications:  Scheduled: . ipratropium-albuterol      . [MAR Hold] atorvastatin  80 mg Oral Daily  . [MAR Hold] levothyroxine  75 mcg Oral QAC breakfast  . [MAR Hold] pantoprazole  40 mg Intravenous Q12H   Continuous: . sodium chloride    . lactated ringers 1,000 mL (01/04/17 1425)    Assessment/Plan: 1) GI bleed. 2) Anemia. 3) COPD - clinical diagnosis.     I evaluated the patient with anesthesia.  The intent was to pursue an EGD, but his baseline respiratory status is poor.  He has never been formally evaluated for COPD, but with this lip pursing with exhalation and his lifelong history of smoking, it appears that he has COPD.  His baseline oxygen saturation is 85-88% on room air.  The risks of the procedure are much too high to pursue an EGD compared to the benefits.  The patient agrees.  It is reasonable in this situation to assume that he has NSAID-induced ulcers and to treat him with PPIs as well as avoiding all NSAIDs in the future.  In this situation, it is not unreasonable to check an H. Pylori serology and treat if he is positive for the antibody.  I think the pretest probability for an active H. Pylori infection is high, if he is positive, which warrants treatment.    Plan: 1) PPI indefinitely.  2) Check H. Pylori antibody and treat if positive. 3) Follow HGB and transfuse if necessary. 4) Dr. Fuller Plan is covering for me this weekend.    LOS: 1 day   Ved Martos D 01/04/2017, 3:02 PM

## 2017-01-04 NOTE — Progress Notes (Signed)
Spoke with pt and wife at bedside.  Discussed plan of proceeding with IV Lasix tonight to remove extra fluid.  Pt is familiar with Lasix.  Wife states that his pursed lip breathing is something he does at home.  Pt agreeable to stay overnight.  Pt respirations are WDL and O2 sats are 90% or greater while patient is sitting in the chair.  Pt requesting to go home tomorrow and follow up with GI as an outpatient.

## 2017-01-05 DIAGNOSIS — D649 Anemia, unspecified: Secondary | ICD-10-CM

## 2017-01-05 DIAGNOSIS — A0472 Enterocolitis due to Clostridium difficile, not specified as recurrent: Secondary | ICD-10-CM | POA: Diagnosis present

## 2017-01-05 DIAGNOSIS — K921 Melena: Secondary | ICD-10-CM

## 2017-01-05 LAB — CBC
HEMATOCRIT: 25 % — AB (ref 39.0–52.0)
Hemoglobin: 8.2 g/dL — ABNORMAL LOW (ref 13.0–17.0)
MCH: 31.7 pg (ref 26.0–34.0)
MCHC: 32.8 g/dL (ref 30.0–36.0)
MCV: 96.5 fL (ref 78.0–100.0)
PLATELETS: 164 10*3/uL (ref 150–400)
RBC: 2.59 MIL/uL — ABNORMAL LOW (ref 4.22–5.81)
RDW: 16.4 % — AB (ref 11.5–15.5)
WBC: 6.5 10*3/uL (ref 4.0–10.5)

## 2017-01-05 LAB — BASIC METABOLIC PANEL
Anion gap: 8 (ref 5–15)
BUN: 22 mg/dL — AB (ref 6–20)
CO2: 31 mmol/L (ref 22–32)
CREATININE: 0.86 mg/dL (ref 0.61–1.24)
Calcium: 8.2 mg/dL — ABNORMAL LOW (ref 8.9–10.3)
Chloride: 100 mmol/L — ABNORMAL LOW (ref 101–111)
Glucose, Bld: 125 mg/dL — ABNORMAL HIGH (ref 65–99)
POTASSIUM: 4 mmol/L (ref 3.5–5.1)
SODIUM: 139 mmol/L (ref 135–145)

## 2017-01-05 LAB — C DIFFICILE QUICK SCREEN W PCR REFLEX
C DIFFICILE (CDIFF) TOXIN: POSITIVE — AB
C DIFFICLE (CDIFF) ANTIGEN: POSITIVE — AB
C Diff interpretation: DETECTED

## 2017-01-05 MED ORDER — VANCOMYCIN 50 MG/ML ORAL SOLUTION: 125.0000 mg | Freq: Four times a day (QID) | ORAL | Status: DC

## 2017-01-05 MED ORDER — VANCOMYCIN 50 MG/ML ORAL SOLUTION: 125.0000 mg | Freq: Four times a day (QID) | ORAL | 0 refills | Status: DC

## 2017-01-05 MED ORDER — PANTOPRAZOLE SODIUM 40 MG PO TBEC: 40.0000 mg | DELAYED_RELEASE_TABLET | Freq: Two times a day (BID) | ORAL | 4 refills | Status: DC

## 2017-01-05 MED ORDER — ACETAMINOPHEN 325 MG PO TABS: 650.0000 mg | ORAL_TABLET | Freq: Four times a day (QID) | ORAL | Status: DC | PRN

## 2017-01-05 MED ORDER — ASPIRIN 81 MG PO TABS: 81.0000 mg | ORAL_TABLET | Freq: Every day | ORAL | Status: DC

## 2017-01-05 MED ORDER — VANCOMYCIN 50 MG/ML ORAL SOLUTION
125.0000 mg | Freq: Four times a day (QID) | ORAL | Status: DC
  Administered 2017-01-05: 125 mg via ORAL
  Filled 2017-01-05 (×3): qty 2.5

## 2017-01-05 MED ORDER — PANTOPRAZOLE SODIUM 40 MG PO TBEC: 40.0000 mg | DELAYED_RELEASE_TABLET | Freq: Two times a day (BID) | ORAL | Status: DC

## 2017-01-05 NOTE — Discharge Summary (Signed)
Physician Discharge Summary   Patient ID: Jeremiah [name] MRN: [id number] DOB/AGE: [birth year] 71 y.o.  Admit date: 01/02/2017 Discharge date: 01/05/2017  Primary Care Physician:  [name], MD  Discharge Diagnoses:    . Upper GI bleed . Enteritis due to Clostridium difficile . SVT (supraventricular tachycardia) ([facility]) . Morbid obesity ([facility]) . Essential hypertension . OSA (obstructive sleep apnea) . Symptomatic anemia . Hypothyroidism, acquired    Consults:  Gastroenterology  Recommendations for Outpatient Follow-up:  1. Please repeat CBC/BMET at next visit 2. Patient needs a referral to pulmonology for PFTs, rule out COPD   DIET: Heart healthy diet  Allergies:   Allergies  Allergen Reactions  . Scallops [Shellfish Allergy] Other (See Comments)    Rash; shortness of breath  . Fluzone [Flu Virus Vaccine] Nausea And Vomiting, Palpitations and Rash     DISCHARGE MEDICATIONS: Current Discharge Medication List    START taking these medications   Details  acetaminophen (TYLENOL) 325 MG tablet Take 2 tablets (650 mg total) by mouth every 6 (six) hours as needed for mild pain (or Fever >/= 101).    pantoprazole (PROTONIX) 40 MG tablet Take 1 tablet (40 mg total) by mouth 2 (two) times daily before a meal. Take twice a day for 1 month then change to once a day in the morning Qty: 60 tablet, Refills: 4    vancomycin (VANCOCIN) 50 mg/mL oral solution Take 2.5 mLs (125 mg total) by mouth 4 (four) times daily. x14 days Qty: 60 mL, Refills: 0      CONTINUE these medications which have CHANGED   Details  aspirin 81 MG tablet Take 1 tablet (81 mg total) by mouth daily. Hold for 5 days Qty: 30 tablet      CONTINUE these medications which have NOT CHANGED   Details  atorvastatin (LIPITOR) 80 MG tablet Take 80 mg by mouth daily.    levothyroxine (SYNTHROID, LEVOTHROID) 75 MCG tablet Take 75 mcg by mouth daily before breakfast.    metoprolol (LOPRESSOR) 100 MG tablet Take  100 mg by mouth 2 (two) times daily.    sodium-potassium bicarbonate (ALKA-SELTZER GOLD) TBEF dissolvable tablet Take 2 tablets by mouth 2 (two) times daily as needed (sour stomach).    Turmeric 450 MG CAPS Take 450 mg by mouth daily.         Brief H and P: For complete details please refer to admission H and P, but in brief Patient is a 71 year old male with CAD s/p CABG, SVT/AVNRT, OSA not on CPAP, and HTNwho presents with melena and now dizziness. Patient reported that a week or 2 ago he had developed a painful lump in his head. He was taking ibuprofen and Aleve a lot for his wife andand even had it I&D'd twice and placed on cephalexin changed to Bactrim by his PCP. Over the weekend he started to have diarrhea that turned black. The melena continued and then he developed new dizziness, weakness and orthostatic symptoms. In ED, heart rate was 1:30, BP 96/70, hemoglobin 7.8, baseline hemoglobin 14-16. FOBT positive. He was admitted to stepdown unit.  Hospital Course:  Acute blood loss anemia secondary to upper GI bleed: - Suspected upper GI bleed in setting of NSAID use and chronic GERD. -Patient was placed on IV PPI. He was placed on nothing by mouth status with IV fluids.  - Hemoglobin was 7.8 he received 2 units of packed RBCs. Hemoglobin improved to 8.2 at the time of discharge. Gastroenterology was consulted. EGD was planned however patient  desatted prior to EGD and it was canceled. I spoke with Dr. Benson Norway who felt that patient may have an underlying undiagnosed COPD and he was having pursed lip breathing at that time. Patient should have outpatient pulmonary evaluation in case EGD is pursued at later stage. - GI recommended PPI twice a day for one month and daily after diet indefinitely. H pylori serology will be followed by GI. Outpatient follow-up in 2 weeks with Dr. Benson Norway. - GI recommended patient it is okay to continue aspirin at this time however no NSAIDs. However in the light of  C. difficile positive enteritis/colitis, I recommended to hold of aspirin for 5 days due to high risk of bleeding.   C. difficile enteritis - As patient had melena and diarrhea at the time of admission, antibiotic use prior to admission due to staph infection on his forehead, C. difficile was checked and found to be positive toxin - Patient is placed on oral vancomycin 125 mg qid for 14 days. I did discuss with Dr. Johnnye Sima from infectious disease who recommended to continue PPI in this situation of UGIB.   Hypotension  - continue to hold antihypertensives, saline lock today  Hypothyrodism: -Continue levothyroxine  OSA: Not on CPAP  CAD, history of CABG - Currently stable, no chest pain or shortness of breath - Hold aspirin for 5 days, continue statin - Home O2 evaluations were checked and patient didn't qualify for home O2 - Chest x-ray had shown no pneumonia. Patient occasionally smokes, which I recommended him to stop. Recommended outpatient pulmonary evaluation.   Day of Discharge BP 137/67 (BP Location: Right Arm)   Pulse 86   Temp 98.9 F (37.2 C) (Oral)   Resp (!) 22   Ht 5\' 7"  (1.702 m)   Wt 111.1 kg (245 lb)   SpO2 93%   BMI 38.37 kg/m   Physical Exam: General: Alert and awake oriented x3 not in any acute distress. HEENT: anicteric sclera, pupils reactive to light and accommodation CVS: S1-S2 clear no murmur rubs or gallops Chest: clear to auscultation bilaterally, no wheezing rales or rhonchi Abdomen: soft nontender, nondistended, normal bowel sounds Extremities: no cyanosis, clubbing or edema noted bilaterally Neuro: Cranial nerves II-XII intact, no focal neurological deficits   The results of significant diagnostics from this hospitalization (including imaging, microbiology, ancillary and laboratory) are listed below for reference.    LAB RESULTS: Basic Metabolic Panel:  Recent Labs Lab 01/04/17 0337 01/05/17 0226  NA 137 139  K 3.7 4.0  CL 100*  100*  CO2 28 31  GLUCOSE 116* 125*  BUN 23* 22*  CREATININE 0.77 0.86  CALCIUM 8.1* 8.2*   Liver Function Tests:  Recent Labs Lab 01/02/17 2111  AST 25  ALT 22  ALKPHOS 45  BILITOT 0.5  PROT 6.1*  ALBUMIN 3.2*    Recent Labs Lab 01/02/17 2111  LIPASE 33   No results for input(s): AMMONIA in the last 168 hours. CBC:  Recent Labs Lab 01/04/17 0337 01/05/17 1119  WBC 6.4 6.5  HGB 8.3* 8.2*  HCT 25.5* 25.0*  MCV 97.7 96.5  PLT 128* 164   Cardiac Enzymes:  Recent Labs Lab 01/02/17 2111  TROPONINI <0.03   BNP: Invalid input(s): POCBNP CBG: No results for input(s): GLUCAP in the last 168 hours.  Significant Diagnostic Studies:  Dg Chest Port 1 View  Result Date: 01/03/2017 CLINICAL DATA:  Cough for 4 days. EXAM: PORTABLE CHEST 1 VIEW COMPARISON:  Radiograph 08/23/2016 FINDINGS: Patient is post median  sternotomy. Unchanged heart size and mediastinal contours. No confluent airspace disease to suggest pneumonia. Mild bibasilar atelectasis. No pulmonary edema or pleural fluid. No pneumothorax. Patient is rotated. IMPRESSION: Mild bibasilar atelectasis.  Otherwise unchanged exam. Electronically Signed   By: Jeb Levering M.D.   On: 01/03/2017 04:03    2D ECHO:   Disposition and Follow-up: Discharge Instructions    Diet - low sodium heart healthy    Complete by:  As directed    Discharge instructions    Complete by:  As directed    Please DONOT take any NSAIDS, alleve, motrin etc. Please ask Dr Dorthy Cooler for referral to Pulmonologist for COPD.   Increase activity slowly    Complete by:  As directed        DISPOSITION: [place], MD. Schedule an appointment as soon as possible for a visit in 10 day(s).   Specialty:  Family Medicine Contact information: [address] [place] [address] [phone]        Beryle Beams, MD. Schedule an appointment as soon as possible  for a visit in 2 week(s).   Specialty:  Gastroenterology Contact information: [address] [place] [address] [id number]            Time spent on Discharge: 35 minutes  Signed:   RAI,RIPUDEEP M.D. [facility] 01/05/2017, 12:29 PM Pager: [phone]

## 2017-01-05 NOTE — Progress Notes (Signed)
CRITICAL VALUE ALERT  Critical value received: positive c-diff  Date of notification:  2 3  Time of notification:  1100  Critical value read back:y  Nurse who received alert:  Consuelo Pandy RN  MD notified (1st page):  Dr Tana Coast  Time of first page: 1139  MD notified (2nd page):  Time of second page:  Responding MD:    Time MD responded:

## 2017-01-05 NOTE — Progress Notes (Signed)
Reviewed discharge instructions with pt including medications and follow up appts. Pt's spouse went to get po vanc filled at pharmacy. Printed information on c-diff for pt and spouse. Jeremiah Pandy RN

## 2017-01-05 NOTE — Progress Notes (Signed)
SATURATION QUALIFICATIONS: (This note is used to comply with regulatory documentation for home oxygen)  Patient Saturations on Room Air at Rest = 94%  Patient Saturations on Room Air while Ambulating = 92%  Patient Saturations on  =0% Liters  Please briefly explain why patient needs home oxygen:

## 2017-01-05 NOTE — Progress Notes (Addendum)
    GI Progress Note - covering for Dr. Benson Norway   Subjective   Had a dark, but not black or tarry, bowel movement this morning.    Objective  Vital signs in last 24 hours: Temp:  [98 F (36.7 C)-98.8 F (37.1 C)] 98.2 F (36.8 C) (02/03 0751) Pulse Rate:  [84-100] 90 (02/03 0752) Resp:  [16-26] 16 (02/03 0752) BP: (92-122)/(57-95) 110/61 (02/03 0752) SpO2:  [90 %-97 %] 94 % (02/03 0752) Last BM Date: 01/02/17  General: Alert, well-developed, in NAD Heart:  Regular rate and rhythm; no murmurs Chest: Decreased breath sounds to ascultation bilaterally.  Abdomen:  Soft, nontender and nondistended. Normal bowel sounds, without guarding, and without rebound.   Extremities:  Without edema. Neurologic:  Alert and  oriented x4; grossly normal neurologically. Psych:  Alert and cooperative. Normal mood and affect.  Intake/Output from previous day: 02/02 0701 - 02/03 0700 In: [id number] [P.O.:240; I.V.:1050] Out: 1150 [Urine:1150] Intake/Output this shift: No intake/output data recorded.  Lab Results:  Recent Labs  01/02/17 2111 01/03/17 0720 01/03/17 1942 01/04/17 0337  WBC 7.6 7.3  --  6.4  HGB 7.8* 8.2* 8.4* 8.3*  HCT 24.1* 24.9* 25.9* 25.5*  PLT 163 121*  --  128*   BMET  Recent Labs  01/02/17 2111 01/04/17 0337 01/05/17 0226  NA 134* 137 139  K 3.6 3.7 4.0  CL 99* 100* 100*  CO2 25 28 31   GLUCOSE 192* 116* 125*  BUN 56* 23* 22*  CREATININE 1.10 0.77 0.86  CALCIUM 8.5* 8.1* 8.2*   LFT  Recent Labs  01/02/17 2111  PROT 6.1*  ALBUMIN 3.2*  AST 25  ALT 22  ALKPHOS 45  BILITOT 0.5      Assessment & Plan   1. Suspected ulcer or erosions from NSAIDs leading to UGI bleed which has resolved. Hb stable. Consider EGD in future after outpatient pulmonary evaluation and mgmt. OK for discharge from GI standpoint. ASA 81 mg daily is ok however no other NSAIDs. PPI bid for 1 month then PPI qam. Await H pylori serology. Outpatient GI follow up with Dr. Benson Norway in 2 weeks.  GI signing off.   Principal Problem:   GI bleed Active Problems:   SVT (supraventricular tachycardia) ([facility])   S/P CABG x 3   Morbid obesity ([facility])   Essential hypertension   OSA (obstructive sleep apnea)   Symptomatic anemia   Hypothyroidism, acquired   Cough   Diarrhea    LOS: 2 days   Norberto Sorenson T. Fuller Plan  MD  01/05/2017, 9:00 AM

## 2017-01-07 LAB — TYPE AND SCREEN
Blood Product Expiration Date: 201802242359
Blood Product Expiration Date: 201803032359
Blood Product Expiration Date: 201803032359
ISSUE DATE / TIME: 201802010115
ISSUE DATE / TIME: 201802011422
UNIT TYPE AND RH: 5100
UNIT TYPE AND RH: 5100
Unit Type and Rh: 5100

## 2017-01-07 LAB — H. PYLORI ANTIBODY, IGG: H Pylori IgG: <0.8 Index Value (ref 0.00–0.79)

## 2017-01-11 DIAGNOSIS — E1165 Type 2 diabetes mellitus with hyperglycemia: Secondary | ICD-10-CM | POA: Diagnosis not present

## 2017-01-11 DIAGNOSIS — E119 Type 2 diabetes mellitus without complications: Secondary | ICD-10-CM | POA: Diagnosis not present

## 2017-01-11 DIAGNOSIS — K921 Melena: Secondary | ICD-10-CM | POA: Diagnosis not present

## 2017-01-11 DIAGNOSIS — A0472 Enterocolitis due to Clostridium difficile, not specified as recurrent: Secondary | ICD-10-CM | POA: Diagnosis not present

## 2017-01-11 DIAGNOSIS — R0602 Shortness of breath: Secondary | ICD-10-CM | POA: Diagnosis not present

## 2017-02-11 ENCOUNTER — Encounter (HOSPITAL_COMMUNITY): Payer: Self-pay | Admitting: Emergency Medicine

## 2017-02-11 ENCOUNTER — Ambulatory Visit (HOSPITAL_COMMUNITY)
Admission: EM | Admit: 2017-02-11 | Discharge: 2017-02-11 | Disposition: A | Discharge status: Home / Self Care | Payer: Federal, State, Local not specified - PPO | Attending: Emergency Medicine | Admitting: Emergency Medicine

## 2017-02-11 DIAGNOSIS — H0012 Chalazion right lower eyelid: Secondary | ICD-10-CM

## 2017-02-11 MED ORDER — ERYTHROMYCIN 5 MG/GM OP OINT: TOPICAL_OINTMENT | OPHTHALMIC | 0 refills | Status: DC

## 2017-02-11 NOTE — ED Provider Notes (Signed)
CSN: [id number]     Arrival date & time 02/11/17  1654 History   First MD Initiated Contact with Patient 02/11/17 1821     Chief Complaint  Patient presents with  . Eye Problem   (Consider location/radiation/quality/duration/timing/severity/associated sxs/prior Treatment) 71 year old male presents to clinic with a 24-hour history of swelling in the periorbital area of the right eye. He denies any pain, itching, blurred vision, warts to the affected area, he denies any discharge from the eye, fever, chills, or any symptoms of systemic disease. He reports he applied a warm wet towel to his face earlier today and brought the swelling down.   The history is provided by the patient.    Past Medical History:  Diagnosis Date  . A-fib ([facility])   . CAD (coronary artery disease)    a. 2006: CABG in 2006 with LIMA-LAD, SVG-OM1, and SVG-RPDA  . Chronic pain 03/21/2016  . Essential hypertension 03/21/2016  . History of prostate cancer 03/21/2016  . HLD (hyperlipidemia)   . HTN (hypertension)   . Hx of CABG 2006  . Hypercholesteremia 03/21/2016  . Hypothyroidism   . Medication management 03/21/2016  . Morbid obesity ([facility]) 03/21/2016  . Myocardial infarct   . OSA (obstructive sleep apnea) 03/21/2016  . Pain in right ankle and joints of right foot 03/21/2016  . Paronychia 03/21/2016  . Primary insomnia 03/21/2016  . Prostate cancer ([facility]) 2008  . Tobacco dependence 03/21/2016   Past Surgical History:  Procedure Laterality Date  . ANKLE SURGERY  12/2013  . CARDIAC CATHETERIZATION N/A 05/08/2016   Procedure: Left Heart Cath and Cors/Grafts Angiography;  Surgeon: Belva Crome, MD;  Location: [facility];  Service: Cardiovascular;  Laterality: N/A;  . CORONARY ARTERY BYPASS GRAFT  2006   x3  . ELECTROPHYSIOLOGIC STUDY N/A 08/24/2016   Procedure: SVT Ablation;  Surgeon: Will Meredith Leeds, MD;  Location: [facility];  Service: Cardiovascular;  Laterality: N/A;  . PROSTATE SURGERY  2008  .  PTCA     Family History  Problem Relation Age of Onset  . Dementia Mother   . Diabetes Sister    Social History  Substance Use Topics  . Smoking status: Former Smoker    Packs/day: 0.50    Types: Cigarettes    Quit date: 05/26/2016  . Smokeless tobacco: Never Used  . Alcohol use Yes     Comment: very seldom    Review of Systems  Reason unable to perform ROS: as covered in HPI.  All other systems reviewed and are negative.   Allergies  Scallops [shellfish allergy] and Fluzone [flu virus vaccine]  Home Medications   Prior to Admission medications   Medication Sig Start Date End Date Taking? Authorizing Provider  acetaminophen (TYLENOL) 325 MG tablet Take 2 tablets (650 mg total) by mouth every 6 (six) hours as needed for mild pain (or Fever >/= 101). 01/05/17   Ripudeep Krystal Eaton, MD  aspirin 81 MG tablet Take 1 tablet (81 mg total) by mouth daily. Hold for 5 days 01/05/17   Ripudeep Krystal Eaton, MD  atorvastatin (LIPITOR) 80 MG tablet Take 80 mg by mouth daily.    Historical Provider, MD  erythromycin ophthalmic ointment Place a 1/2 inch ribbon of ointment into the lower eyelid twice daily. 02/11/17   Barnet Glasgow, NP  levothyroxine (SYNTHROID, LEVOTHROID) 75 MCG tablet Take 75 mcg by mouth daily before breakfast.    Historical Provider, MD  metoprolol (LOPRESSOR) 100 MG tablet Take 100 mg  by mouth 2 (two) times daily.    Historical Provider, MD  pantoprazole (PROTONIX) 40 MG tablet Take 1 tablet (40 mg total) by mouth 2 (two) times daily before a meal. Take twice a day for 1 month then change to once a day in the morning 01/05/17   Ripudeep K Rai, MD  sodium-potassium bicarbonate (ALKA-SELTZER GOLD) TBEF dissolvable tablet Take 2 tablets by mouth 2 (two) times daily as needed (sour stomach).    Historical Provider, MD  Turmeric 450 MG CAPS Take 450 mg by mouth daily.    Historical Provider, MD  vancomycin (VANCOCIN) 50 mg/mL oral solution Take 2.5 mLs (125 mg total) by mouth 4 (four) times  daily. x14 days 01/05/17   Ripudeep Krystal Eaton, MD   Meds Ordered and Administered this Visit  Medications - No data to display  BP (!) 117/40 (BP Location: Right Arm)   Pulse 83   Temp 98.1 F (36.7 C) (Oral)   Resp 17   Ht 5\' 8"  (1.727 m)   Wt 239 lb (108.4 kg)   SpO2 94%   BMI 36.34 kg/m  No data found.   Physical Exam  Constitutional: He is oriented to person, place, and time. He appears well-developed and well-nourished. No distress.  HENT:  Head: Normocephalic and atraumatic.  Right Ear: External ear normal.  Left Ear: External ear normal.  Mouth/Throat: Oropharynx is clear and moist.  Eyes: Conjunctivae and EOM are normal. Pupils are equal, round, and reactive to light. Right eye exhibits no discharge. Left eye exhibits no discharge.  Neck: Normal range of motion. Neck supple. No JVD present.  Cardiovascular: Normal rate and regular rhythm.   Lymphadenopathy:    He has no cervical adenopathy.  Neurological: He is alert and oriented to person, place, and time.  Skin: Skin is warm and dry. Capillary refill takes less than 2 seconds. No rash noted. He is not diaphoretic. No erythema. No pallor.  Psychiatric: He has a normal mood and affect. His behavior is normal.  Nursing note and vitals reviewed.   Urgent Care Course     Procedures (including critical care time)  Labs Review Labs Reviewed - No data to display  Imaging Review No results found.    MDM   1. Chalazion of right lower eyelid    Apply warm compress to the affected area 15 minutes at a time up to 4 times a day. I prescribed erythromycin eye ointment, apply to the affected area twice a day. Should your symptoms persist, follow up with her primary care provider or return to clinic.      Barnet Glasgow, NP 02/11/17 2135

## 2017-02-11 NOTE — Discharge Instructions (Addendum)
Apply warm compress to the affected area 15 minutes at a time up to 4 times a day. I prescribed erythromycin eye ointment, apply to the affected area twice a day. Should your symptoms persist, follow up with her primary care provider or return to clinic.

## 2017-02-11 NOTE — ED Triage Notes (Signed)
Pt. Stated, My rt. Eye has been swollen and I have a bump right above it on the rt. Side of forehead

## 2017-02-12 ENCOUNTER — Ambulatory Visit (INDEPENDENT_AMBULATORY_CARE_PROVIDER_SITE_OTHER): Payer: Federal, State, Local not specified - PPO | Admitting: Internal Medicine

## 2017-02-12 ENCOUNTER — Encounter: Payer: Self-pay | Admitting: Internal Medicine

## 2017-02-12 VITALS — BP 134/84 | HR 71 | Temp 97.9°F | Ht 68.0 in | Wt 248.0 lb

## 2017-02-12 DIAGNOSIS — I10 Essential (primary) hypertension: Secondary | ICD-10-CM | POA: Diagnosis not present

## 2017-02-12 DIAGNOSIS — L0201 Cutaneous abscess of face: Secondary | ICD-10-CM

## 2017-02-12 DIAGNOSIS — R0602 Shortness of breath: Secondary | ICD-10-CM | POA: Insufficient documentation

## 2017-02-12 DIAGNOSIS — R0609 Other forms of dyspnea: Secondary | ICD-10-CM | POA: Diagnosis not present

## 2017-02-12 DIAGNOSIS — R0600 Dyspnea, unspecified: Secondary | ICD-10-CM | POA: Insufficient documentation

## 2017-02-12 MED ORDER — BISOPROLOL FUMARATE 5 MG PO TABS: 5.0000 mg | ORAL_TABLET | Freq: Every day | ORAL | 11 refills | Status: DC

## 2017-02-12 NOTE — Progress Notes (Signed)
Subjective:     Patient ID: Jeremiah [name], male   DOB: February 04, 1946,    MRN: [id number]  HPI   68 yowm quit smoking 12/15/16 with onset of doe x 2008    Admit date: 01/02/2017 Discharge date: 01/05/2017  Primary Care Physician:  Jeremiah Amel, MD  Discharge Diagnoses:    . Upper GI bleed . Enteritis due to Clostridium difficile . SVT (supraventricular tachycardia) ([place]) . Morbid obesity ([place]) . Essential hypertension . OSA (obstructive sleep apnea) . Symptomatic anemia . Hypothyroidism, acquired    Consults:  Gastroenterology  Recommendations for Outpatient Follow-up:  1. Please repeat CBC/BMET at next visit 2. Patient needs a referral to pulmonology for PFTs, rule out COPD       02/12/2017 1st [facility] office visit/ Jeremiah [name]  Re ? Copd  Chief Complaint  Patient presents with  . Pulmonary Consult    Referred by Dr. Dorthy [name]. Pt c/o SOB for the past 10 yrs. He gets SOB when running and working "which I don't do anymore".   MMRC2 = can't walk a nl pace on a flat grade s sob but does fine slow and flat eg shopping / limited also by R chronic ankle pain p fx  No obvious day to day or daytime variability or assoc excess/ purulent sputum or mucus plugs or hemoptysis or cp or chest tightness, subjective wheeze or overt sinus   symptoms. No unusual exp hx or h/o childhood pna/ asthma or knowledge of premature birth.  Sleeping ok without nocturnal  or early am exacerbation  of respiratory  c/o's or need for noct saba. Also denies any obvious fluctuation of symptoms with weather or environmental changes or other aggravating or alleviating factors except as outlined above   Current Medications, Allergies, Complete Past Medical History, Past Surgical History, Family History, and Social History were reviewed in [employer] record.  ROS  The following are not active complaints unless bolded sore throat, dysphagia, dental problems, itching, sneezing,   nasal congestion or excess/ purulent secretions, ear ache,   fever, chills, sweats, unintended wt loss, classically pleuritic or exertional cp,  orthopnea pnd or leg swelling, presyncope, palpitations, abdominal pain, anorexia, nausea, vomiting, diarrhea  or change in bowel or bladder habits, change in stools or urine, dysuria,hematuria,  rash, arthralgias, visual complaints, headache, numbness, weakness or ataxia or problems with walking or coordination,  change in mood/affect or memory.             Review of Systems     Objective:   Physical Exam Obese amb wm nad  Wt Readings from Last 3 Encounters:  02/12/17 248 lb (112.5 kg)  02/11/17 239 lb (108.4 kg)  01/02/17 245 lb (111.1 kg)    Vital signs reviewed  - Note on arrival 02 sats  95% on RA   HEENT: nl dentition, turbinates bilaterally, and oropharynx. Nl external ear canals without cough reflex   NECK :  without JVD/Nodes/TM/ nl carotid upstrokes bilaterally   LUNGS: no acc muscle use,  Nl contour chest which is clear to A and P bilaterally without cough on insp or exp maneuvers   CV:  RRR  no s3 or murmur or increase in P2, and no edema   ABD:  soft and nontender with nl inspiratory excursion in the supine position. No bruits or organomegaly appreciated, bowel sounds nl  MS:  Nl gait/ ext warm without deformities, calf tenderness, cyanosis or clubbing No obvious joint restrictions   SKIN: warm and dry  - ?  R temple carbuncle and mild periobital swelling   NEURO:  alert, approp, nl sensorium with  no motor or cerebellar deficits apparent.       I personally reviewed images and agree with radiology impression as follows:  CXR:   01/03/17 Mild bibasilar atelectasis.  Otherwise unchanged exam.     Labs ordered/ reviewed:      Chemistry      Component Value Date/Time   NA 139 01/05/2017 0226   K 4.0 01/05/2017 0226   CL 100 (L) 01/05/2017 0226   CO2 31 01/05/2017 0226   BUN 22 (H) 01/05/2017 0226    CREATININE 0.86 01/05/2017 0226      Component Value Date/Time   CALCIUM 8.2 (L) 01/05/2017 0226   ALKPHOS 45 01/02/2017 2111   AST 25 01/02/2017 2111   ALT 22 01/02/2017 2111   BILITOT 0.5 01/02/2017 2111        Lab Results  Component Value Date   WBC 6.5 01/05/2017   HGB 8.2 (L) 01/05/2017   HCT 25.0 (L) 01/05/2017   MCV 96.5 01/05/2017   PLT 164 01/05/2017          BNP                       246                                                                  01/05/17     Assessment:

## 2017-02-12 NOTE — Patient Instructions (Addendum)
Stop lopressor (metaprolol) and start bisoprolol 5 mg twice daily   If face/eye symptoms  worse or fever go back to ER   Please schedule a follow up office visit in 4 weeks, sooner if needed with pfts on return

## 2017-02-13 DIAGNOSIS — E119 Type 2 diabetes mellitus without complications: Secondary | ICD-10-CM | POA: Diagnosis not present

## 2017-02-13 DIAGNOSIS — R195 Other fecal abnormalities: Secondary | ICD-10-CM | POA: Diagnosis not present

## 2017-02-13 DIAGNOSIS — L02811 Cutaneous abscess of head [any part, except face]: Secondary | ICD-10-CM | POA: Diagnosis not present

## 2017-02-13 DIAGNOSIS — K921 Melena: Secondary | ICD-10-CM | POA: Diagnosis not present

## 2017-02-13 DIAGNOSIS — D509 Iron deficiency anemia, unspecified: Secondary | ICD-10-CM | POA: Diagnosis not present

## 2017-02-13 DIAGNOSIS — Z7984 Long term (current) use of oral hypoglycemic drugs: Secondary | ICD-10-CM | POA: Diagnosis not present

## 2017-02-13 DIAGNOSIS — L0291 Cutaneous abscess, unspecified: Secondary | ICD-10-CM | POA: Insufficient documentation

## 2017-02-13 NOTE — Assessment & Plan Note (Signed)
See ER note 02/11/17 > f/u dermatology planned  This is apparently a recurrent problem already seen prev by dermatologist though he can't tell me the name but "has it at home" - suggested he go home and notify the derm and if fever or worse symptoms in interim go back to ER

## 2017-02-13 NOTE — Assessment & Plan Note (Addendum)
Spirometry 02/12/2017  FEV1 0.79 (20%)  Ratio 45 but f/v loop is junk    Symptoms are markedly disproportionate to objective findings and not clear this is a lung problem but pt does appear to have difficult airway management issues. DDX of  difficult airways management almost all start with A and  include Adherence,Anemia,  Ace Inhibitors, Acid Reflux, Active Sinus Disease, Alpha 1 Antitripsin deficiency, Anxiety masquerading as Airways dz,  ABPA,  Allergy(esp in young), Aspiration (esp in elderly), Adverse effects of meds,  Active smokers, A bunch of PE's (a small clot burden can't cause this syndrome unless there is already severe underlying pulm or vascular dz with poor reserve) plus two Bs  = Bronchiectasis and Beta blocker use..and one C= CHF  Adherence is always the initial "prime suspect" and is a multilayered concern that requires a "trust but verify" approach in every patient - starting with knowing how to use medications, especially inhalers, correctly, keeping up with refills and understanding the fundamental difference between maintenance and prns vs those medications only taken for a very short course and then stopped and not refilled.  - seems very shaky on details of care so will ask him to return with all meds in hand using a trust but verify approach to confirm accurate Medication  Reconciliation The principal here is that until we are certain that the  patients are doing what we've asked, it makes no sense to ask them to do more.   Anemia > f/u GI/ PCP  Allergies/ asthma > unlikely s noct flares/ variability/ cough or assoc rhinitis so hold off bronchodilators for now   ? Acid (or non-acid) GERD > always difficult to exclude as up to 75% of pts in some series report no assoc GI/ Heartburn symptoms> rec continue max (24h)  acid suppression and diet restrictions/ reviewed     ? Active smoking > denies  I reviewed the Fletcher curve with the patient that basically indicates  if you quit  smoking when your best day FEV1 is still well preserved (as is only probably relative true here)  it is highly unlikely you will progress to severe disease and informed the patient there was  no medication on the market that has proven to alter the curve/ its downward trajectory  or the likelihood of progression of their disease(unlike other chronic medical conditions such as atheroclerosis where we do think we can change the natural hx with risk reducing meds)    Therefore stopping smoking and maintaining abstinence is the most important aspect of care, not choice of inhalers or for that matter, doctors.    Beta blocker > see hbp   CHF > BNP intermediate, may need f/u later but no obvious chf   Will first try more selective BB then return for formal full pfts  Total time devoted to counseling  > 50 % of initial 60 min office visit:  review case with pt/ discussion of options/alternatives/ personally creating written customized instructions  in presence of pt  then going over those specific  Instructions directly with the pt including how to use all of the meds but in particular covering each new medication in detail and the difference between the maintenance= "automatic" meds and the prns using an action plan format for the latter (If this problem/symptom => do that organization reading Left to right).  Please see AVS from this visit for a full list of these instructions which I personally wrote for this pt and  are  unique to this visit.

## 2017-02-13 NOTE — Assessment & Plan Note (Signed)
Strongly prefer in this setting: Bystolic, the most beta -1  selective Beta blocker available in sample form, with bisoprolol the most selective generic choice  on the market.   Try bisoprolol 5 mg bid instead of high dose toprol short term pending formal pfts

## 2017-02-13 NOTE — Assessment & Plan Note (Signed)
Body mass index is 37.71   No results found for: TSH   Contributing to gerd risk/ doe/reviewed the need and the process to achieve and maintain neg calorie balance > defer f/u primary care including intermittently monitoring thyroid status

## 2017-02-25 ENCOUNTER — Emergency Department (HOSPITAL_COMMUNITY): Payer: Federal, State, Local not specified - PPO

## 2017-02-25 ENCOUNTER — Encounter (HOSPITAL_COMMUNITY): Payer: Self-pay

## 2017-02-25 ENCOUNTER — Inpatient Hospital Stay (HOSPITAL_COMMUNITY)
Admission: EM | Admit: 2017-02-25 | Discharge: 2017-03-02 | DRG: 811 | Disposition: A | Discharge status: Home / Self Care | Payer: Federal, State, Local not specified - PPO | Attending: Internal Medicine | Admitting: Internal Medicine

## 2017-02-25 DIAGNOSIS — J810 Acute pulmonary edema: Secondary | ICD-10-CM | POA: Diagnosis not present

## 2017-02-25 DIAGNOSIS — J90 Pleural effusion, not elsewhere classified: Secondary | ICD-10-CM | POA: Diagnosis not present

## 2017-02-25 DIAGNOSIS — Z79899 Other long term (current) drug therapy: Secondary | ICD-10-CM | POA: Diagnosis not present

## 2017-02-25 DIAGNOSIS — Z87891 Personal history of nicotine dependence: Secondary | ICD-10-CM | POA: Diagnosis not present

## 2017-02-25 DIAGNOSIS — G4733 Obstructive sleep apnea (adult) (pediatric): Secondary | ICD-10-CM | POA: Diagnosis present

## 2017-02-25 DIAGNOSIS — J441 Chronic obstructive pulmonary disease with (acute) exacerbation: Secondary | ICD-10-CM | POA: Diagnosis not present

## 2017-02-25 DIAGNOSIS — I5033 Acute on chronic diastolic (congestive) heart failure: Secondary | ICD-10-CM | POA: Diagnosis not present

## 2017-02-25 DIAGNOSIS — E7800 Pure hypercholesterolemia, unspecified: Secondary | ICD-10-CM | POA: Diagnosis not present

## 2017-02-25 DIAGNOSIS — Z6838 Body mass index (BMI) 38.0-38.9, adult: Secondary | ICD-10-CM | POA: Diagnosis not present

## 2017-02-25 DIAGNOSIS — E039 Hypothyroidism, unspecified: Secondary | ICD-10-CM | POA: Diagnosis present

## 2017-02-25 DIAGNOSIS — E875 Hyperkalemia: Secondary | ICD-10-CM | POA: Diagnosis present

## 2017-02-25 DIAGNOSIS — R0602 Shortness of breath: Secondary | ICD-10-CM | POA: Diagnosis not present

## 2017-02-25 DIAGNOSIS — I2510 Atherosclerotic heart disease of native coronary artery without angina pectoris: Secondary | ICD-10-CM | POA: Diagnosis not present

## 2017-02-25 DIAGNOSIS — K921 Melena: Secondary | ICD-10-CM | POA: Diagnosis not present

## 2017-02-25 DIAGNOSIS — I110 Hypertensive heart disease with heart failure: Secondary | ICD-10-CM | POA: Diagnosis present

## 2017-02-25 DIAGNOSIS — J9611 Chronic respiratory failure with hypoxia: Secondary | ICD-10-CM | POA: Diagnosis not present

## 2017-02-25 DIAGNOSIS — R110 Nausea: Secondary | ICD-10-CM | POA: Diagnosis present

## 2017-02-25 DIAGNOSIS — I472 Ventricular tachycardia: Secondary | ICD-10-CM | POA: Diagnosis not present

## 2017-02-25 DIAGNOSIS — Z7982 Long term (current) use of aspirin: Secondary | ICD-10-CM

## 2017-02-25 DIAGNOSIS — E785 Hyperlipidemia, unspecified: Secondary | ICD-10-CM | POA: Diagnosis present

## 2017-02-25 DIAGNOSIS — R9431 Abnormal electrocardiogram [ECG] [EKG]: Secondary | ICD-10-CM | POA: Diagnosis present

## 2017-02-25 DIAGNOSIS — D500 Iron deficiency anemia secondary to blood loss (chronic): Secondary | ICD-10-CM | POA: Diagnosis not present

## 2017-02-25 DIAGNOSIS — R195 Other fecal abnormalities: Secondary | ICD-10-CM | POA: Diagnosis not present

## 2017-02-25 DIAGNOSIS — F5101 Primary insomnia: Secondary | ICD-10-CM | POA: Diagnosis present

## 2017-02-25 DIAGNOSIS — D649 Anemia, unspecified: Secondary | ICD-10-CM | POA: Diagnosis not present

## 2017-02-25 DIAGNOSIS — Z91013 Allergy to seafood: Secondary | ICD-10-CM

## 2017-02-25 DIAGNOSIS — G8929 Other chronic pain: Secondary | ICD-10-CM | POA: Diagnosis present

## 2017-02-25 DIAGNOSIS — R0902 Hypoxemia: Secondary | ICD-10-CM | POA: Diagnosis not present

## 2017-02-25 DIAGNOSIS — I509 Heart failure, unspecified: Secondary | ICD-10-CM | POA: Diagnosis not present

## 2017-02-25 DIAGNOSIS — I4581 Long QT syndrome: Secondary | ICD-10-CM | POA: Diagnosis present

## 2017-02-25 DIAGNOSIS — Z951 Presence of aortocoronary bypass graft: Secondary | ICD-10-CM

## 2017-02-25 DIAGNOSIS — I252 Old myocardial infarction: Secondary | ICD-10-CM

## 2017-02-25 DIAGNOSIS — D509 Iron deficiency anemia, unspecified: Principal | ICD-10-CM | POA: Diagnosis present

## 2017-02-25 DIAGNOSIS — D751 Secondary polycythemia: Secondary | ICD-10-CM | POA: Diagnosis present

## 2017-02-25 DIAGNOSIS — I5043 Acute on chronic combined systolic (congestive) and diastolic (congestive) heart failure: Secondary | ICD-10-CM | POA: Diagnosis not present

## 2017-02-25 DIAGNOSIS — J9601 Acute respiratory failure with hypoxia: Secondary | ICD-10-CM | POA: Diagnosis not present

## 2017-02-25 DIAGNOSIS — R531 Weakness: Secondary | ICD-10-CM | POA: Diagnosis present

## 2017-02-25 DIAGNOSIS — K219 Gastro-esophageal reflux disease without esophagitis: Secondary | ICD-10-CM | POA: Diagnosis present

## 2017-02-25 DIAGNOSIS — Z8546 Personal history of malignant neoplasm of prostate: Secondary | ICD-10-CM

## 2017-02-25 LAB — COMPREHENSIVE METABOLIC PANEL
ALT: 24 U/L (ref 17–63)
AST: 91 U/L — ABNORMAL HIGH (ref 15–41)
Albumin: 3.5 g/dL (ref 3.5–5.0)
Alkaline Phosphatase: 54 U/L (ref 38–126)
Anion gap: 9 (ref 5–15)
BUN: 16 mg/dL (ref 6–20)
CO2: 29 mmol/L (ref 22–32)
Calcium: 8.3 mg/dL — ABNORMAL LOW (ref 8.9–10.3)
Chloride: 92 mmol/L — ABNORMAL LOW (ref 101–111)
Creatinine, Ser: 1.06 mg/dL (ref 0.61–1.24)
GFR calc Af Amer: >60 mL/min (ref >60)
GFR calc non Af Amer: >60 mL/min (ref >60)
Glucose, Bld: 109 mg/dL — ABNORMAL HIGH (ref 65–99)
Potassium: 5.3 mmol/L — ABNORMAL HIGH (ref 3.5–5.1)
Sodium: 130 mmol/L — ABNORMAL LOW (ref 135–145)
Total Bilirubin: 1.2 mg/dL (ref 0.3–1.2)
Total Protein: 6.3 g/dL — ABNORMAL LOW (ref 6.5–8.1)

## 2017-02-25 LAB — CBC WITH DIFFERENTIAL/PLATELET
BASOS PCT: 0 %
Basophils Absolute: 0 10*3/uL (ref 0.0–0.1)
EOS ABS: 0 10*3/uL (ref 0.0–0.7)
EOS PCT: 1 %
HCT: 28.8 % — ABNORMAL LOW (ref 39.0–52.0)
Hemoglobin: 8.1 g/dL — ABNORMAL LOW (ref 13.0–17.0)
LYMPHS PCT: 34 %
Lymphs Abs: 2.2 10*3/uL (ref 0.7–4.0)
MCH: 25 pg — AB (ref 26.0–34.0)
MCHC: 28.1 g/dL — ABNORMAL LOW (ref 30.0–36.0)
MCV: 88.9 fL (ref 78.0–100.0)
Monocytes Absolute: 0.6 10*3/uL (ref 0.1–1.0)
Monocytes Relative: 10 %
Neutro Abs: 3.7 10*3/uL (ref 1.7–7.7)
Neutrophils Relative %: 55 %
PLATELETS: 304 10*3/uL (ref 150–400)
RBC: 3.24 MIL/uL — ABNORMAL LOW (ref 4.22–5.81)
RDW: 20.5 % — AB (ref 11.5–15.5)
WBC: 6.6 10*3/uL (ref 4.0–10.5)

## 2017-02-25 LAB — I-STAT CHEM 8, ED
BUN: 23 mg/dL — ABNORMAL HIGH (ref 6–20)
CHLORIDE: 94 mmol/L — AB (ref 101–111)
CREATININE: 1.1 mg/dL (ref 0.61–1.24)
Calcium, Ion: 1.08 mmol/L — ABNORMAL LOW (ref 1.15–1.40)
GLUCOSE: 117 mg/dL — AB (ref 65–99)
HEMATOCRIT: 27 % — AB (ref 39.0–52.0)
HEMOGLOBIN: 9.2 g/dL — AB (ref 13.0–17.0)
POTASSIUM: 4.2 mmol/L (ref 3.5–5.1)
Sodium: 135 mmol/L (ref 135–145)
TCO2: 33 mmol/L (ref 0–100)

## 2017-02-25 LAB — I-STAT VENOUS BLOOD GAS, ED
Acid-Base Excess: 10 mmol/L — ABNORMAL HIGH (ref 0.0–2.0)
BICARBONATE: 36.1 mmol/L — AB (ref 20.0–28.0)
O2 Saturation: 86 %
PCO2 VEN: 54.8 mmHg (ref 44.0–60.0)
PH VEN: 7.427 (ref 7.250–7.430)
PO2 VEN: 52 mmHg — AB (ref 32.0–45.0)
TCO2: 38 mmol/L (ref 0–100)

## 2017-02-25 LAB — BRAIN NATRIURETIC PEPTIDE: B NATRIURETIC PEPTIDE 5: 349.2 pg/mL — AB (ref 0.0–100.0)

## 2017-02-25 LAB — I-STAT TROPONIN, ED: Troponin i, poc: 0 ng/mL (ref 0.00–0.08)

## 2017-02-25 MED ORDER — FUROSEMIDE 10 MG/ML IJ SOLN: 60.0000 mg | Freq: Once | INTRAMUSCULAR | Status: DC

## 2017-02-25 MED ORDER — METOCLOPRAMIDE HCL 10 MG PO TABS
10.0000 mg | ORAL_TABLET | Freq: Once | ORAL | Status: AC
  Administered 2017-02-25: 10 mg via ORAL
  Filled 2017-02-25: qty 1

## 2017-02-25 MED ORDER — FUROSEMIDE 10 MG/ML IJ SOLN
40.0000 mg | Freq: Once | INTRAMUSCULAR | Status: AC
  Administered 2017-02-25: 40 mg via INTRAVENOUS
  Filled 2017-02-25: qty 4

## 2017-02-25 NOTE — ED Notes (Signed)
Pt returned from xray

## 2017-02-25 NOTE — ED Notes (Signed)
Pt and family requesting to speak to provider, admitting paged.

## 2017-02-25 NOTE — ED Provider Notes (Signed)
[facility] DEPT Provider Note   CSN: [id number] Arrival date & time: 02/25/17  6659     History   Chief Complaint Chief Complaint  Patient presents with  . Shortness of Breath    HPI Jeremiah [name] is a 70 y.o. male.  The history is provided by the patient and the spouse.  Shortness of Breath  This is a new problem. The current episode started more than 1 week ago. The problem has been gradually worsening. Associated symptoms include PND and orthopnea. Pertinent negatives include no fever, no headaches, no rhinorrhea, no sore throat, no neck pain, no cough, no sputum production, no wheezing, no chest pain, no syncope, no vomiting, no abdominal pain, no leg pain, no leg swelling and no claudication. Associated medical issues include CAD. Associated medical issues do not include asthma, COPD, pneumonia, chronic lung disease, PE, heart failure, DVT or recent surgery.     71 y.o. male PMH CAD, HTN, OSA, prostate cancer presents from PCP for hypoxia. Pt presented to PCP today for one week nausea, dyspnea and fatigue. 83% on RA at PCP, pt is not on oxygen at home and has no diagnosis of lung disease or heart failure. s/p albuterol and atrovent with EMS  Past Medical History:  Diagnosis Date  . A-fib ([place])   . CAD (coronary artery disease)    a. 2006: CABG in 2006 with LIMA-LAD, SVG-OM1, and SVG-RPDA  . Chronic pain 03/21/2016  . Essential hypertension 03/21/2016  . History of prostate cancer 03/21/2016  . HLD (hyperlipidemia)   . HTN (hypertension)   . Hx of CABG 2006  . Hypercholesteremia 03/21/2016  . Hypothyroidism   . Medication management 03/21/2016  . Morbid obesity ([place]) 03/21/2016  . Myocardial infarct   . OSA (obstructive sleep apnea) 03/21/2016  . Pain in right ankle and joints of right foot 03/21/2016  . Paronychia 03/21/2016  . Primary insomnia 03/21/2016  . Prostate cancer ([place]) 2008  . Tobacco dependence 03/21/2016    Patient Active Problem List   Diagnosis Date Noted    . Acute exacerbation of congestive heart failure ([place]) 02/26/2017  . Skin abscess 02/13/2017  . Dyspnea on exertion 02/12/2017  . Enteritis due to Clostridium difficile 01/05/2017  . GI bleed 01/03/2017  . Symptomatic anemia 01/03/2017  . Hypothyroidism, acquired 01/03/2017  . Cough   . Diarrhea   . AVNRT (AV nodal re-entry tachycardia) ([place]) 08/24/2016  . Chest pain 05/05/2016  . NSTEMI (non-ST elevated myocardial infarction) ([place]) 05/05/2016  . SVT (supraventricular tachycardia) ([place]) 03/21/2016  . S/P CABG x 3 03/21/2016  . Morbid obesity due to excess calories ([place]) 03/21/2016  . Essential hypertension 03/21/2016  . Hypercholesteremia 03/21/2016  . Paronychia 03/21/2016  . Tobacco dependence 03/21/2016  . Pain in right ankle and joints of right foot 03/21/2016  . Chronic pain 03/21/2016  . OSA (obstructive sleep apnea) 03/21/2016  . Medication management 03/21/2016  . History of prostate cancer 03/21/2016  . Primary insomnia 03/21/2016    Past Surgical History:  Procedure Laterality Date  . ANKLE SURGERY  12/2013  . CARDIAC CATHETERIZATION N/A 05/08/2016   Procedure: Left Heart Cath and Cors/Grafts Angiography;  Surgeon: Belva Crome, MD;  Location: [facility];  Service: Cardiovascular;  Laterality: N/A;  . CORONARY ARTERY BYPASS GRAFT  2006   x3  . ELECTROPHYSIOLOGIC STUDY N/A 08/24/2016   Procedure: SVT Ablation;  Surgeon: Will Meredith Leeds, MD;  Location: [facility];  Service: Cardiovascular;  Laterality: N/A;  .  PROSTATE SURGERY  2008  . PTCA         Home Medications    Prior to Admission medications   Medication Sig Start Date End Date Taking? Authorizing Provider  aspirin 81 MG tablet Take 1 tablet (81 mg total) by mouth daily. Hold for 5 days Patient taking differently: Take 81 mg by mouth daily.  01/05/17  Yes Ripudeep Krystal Eaton, MD  atorvastatin (LIPITOR) 80 MG tablet Take 80 mg by mouth daily.   Yes Historical Provider, MD  bisoprolol  (ZEBETA) 5 MG tablet Take 1 tablet (5 mg total) by mouth daily. 02/12/17  Yes Tanda Rockers, MD  IRON PO Take 1 tablet by mouth daily.   Yes Historical Provider, MD  levothyroxine (SYNTHROID, LEVOTHROID) 75 MCG tablet Take 75 mcg by mouth daily before breakfast.   Yes Historical Provider, MD  pantoprazole (PROTONIX) 40 MG tablet Take 1 tablet (40 mg total) by mouth 2 (two) times daily before a meal. Take twice a day for 1 month then change to once a day in the morning Patient taking differently: Take 40 mg by mouth daily.  01/05/17  Yes Ripudeep Krystal Eaton, MD  Turmeric 500 MG TABS Take 500 mg by mouth daily.   Yes Historical Provider, MD    Family History Family History  Problem Relation Age of Onset  . Dementia Mother   . Diabetes Sister     Social History Social History  Substance Use Topics  . Smoking status: Former Smoker    Packs/day: 0.50    Years: 60.00    Types: Cigarettes    Quit date: 12/15/2016  . Smokeless tobacco: Never Used  . Alcohol use Yes     Comment: very seldom     Allergies   Scallops [shellfish allergy] and Fluzone [flu virus vaccine]   Review of Systems Review of Systems  Constitutional: Negative for fever.  HENT: Negative for rhinorrhea and sore throat.   Respiratory: Positive for shortness of breath. Negative for cough, sputum production and wheezing.   Cardiovascular: Positive for orthopnea and PND. Negative for chest pain, claudication, leg swelling and syncope.  Gastrointestinal: Positive for nausea. Negative for abdominal pain, anal bleeding, blood in stool and vomiting.  Musculoskeletal: Negative for myalgias and neck pain.  Allergic/Immunologic: Negative for immunocompromised state.  Neurological: Negative for syncope, light-headedness and headaches.  All other systems reviewed and are negative.    Physical Exam Updated Vital Signs BP 138/72   Pulse 62   Temp 98.2 F (36.8 C) (Oral)   Resp 15   Ht 5\' 8"  (1.727 m)   Wt 112.5 kg   SpO2 96%    BMI 37.71 kg/m   Physical Exam  Constitutional: He is oriented to person, place, and time. He appears well-developed and well-nourished. No distress.  HENT:  Head: Normocephalic and atraumatic.  Eyes: Conjunctivae and EOM are normal.  Neck: Normal range of motion. Neck supple.  Cardiovascular: Normal rate and regular rhythm.   No murmur heard. Pulmonary/Chest: Effort normal. Tachypnea (mild) noted. He has rales (diffuse, mild).  Speaks in short sentences.   Abdominal: Soft. There is no tenderness.  Musculoskeletal: Normal range of motion. He exhibits no edema.  Neurological: He is alert and oriented to person, place, and time.  Skin: Skin is warm and dry. Capillary refill takes less than 2 seconds.  Psychiatric: He has a normal mood and affect.  Nursing note and vitals reviewed.    ED Treatments / Results  Labs (all labs  ordered are listed, but only abnormal results are displayed) Labs Reviewed  CBC WITH DIFFERENTIAL/PLATELET - Abnormal; Notable for the following:       Result Value   RBC 3.24 (*)    Hemoglobin 8.1 (*)    HCT 28.8 (*)    MCH 25.0 (*)    MCHC 28.1 (*)    RDW 20.5 (*)    All other components within normal limits  BRAIN NATRIURETIC PEPTIDE - Abnormal; Notable for the following:    B Natriuretic Peptide 349.2 (*)    All other components within normal limits  COMPREHENSIVE METABOLIC PANEL - Abnormal; Notable for the following:    Sodium 130 (*)    Potassium 5.3 (*)    Chloride 92 (*)    Glucose, Bld 109 (*)    Calcium 8.3 (*)    Total Protein 6.3 (*)    AST 91 (*)    All other components within normal limits  I-STAT CHEM 8, ED - Abnormal; Notable for the following:    Chloride 94 (*)    BUN 23 (*)    Glucose, Bld 117 (*)    Calcium, Ion 1.08 (*)    Hemoglobin 9.2 (*)    HCT 27.0 (*)    All other components within normal limits  I-STAT VENOUS BLOOD GAS, ED - Abnormal; Notable for the following:    pO2, Ven 52.0 (*)    Bicarbonate 36.1 (*)     Acid-Base Excess 10.0 (*)    All other components within normal limits  URINALYSIS, ROUTINE W REFLEX MICROSCOPIC  I-STAT TROPOININ, ED    EKG  EKG Interpretation  Date/Time:  Monday February 25 2017 19:02:16 EDT Ventricular Rate:  75 PR Interval:    QRS Duration: 86 QT Interval:  513 QTC Calculation: 574 R Axis:   48 Text Interpretation:  Sinus rhythm Atrial premature complexes Probable left atrial enlargement Repol abnrm suggests ischemia, anterolateral Prolonged QT interval Baseline wander in lead(s) V1 V2 No significant change since last tracing Confirmed by Carmin Muskrat  MD [phone]) on 02/25/2017 7:36:15 PM       Radiology Dg Chest 2 View  Result Date: 02/25/2017 CLINICAL DATA:  Dyspnea EXAM: CHEST  2 VIEW COMPARISON:  01/03/2017 FINDINGS: The patient is status post median sternotomy. Cardiac silhouette is borderline enlarged with aortic atherosclerosis. No aneurysm. Mild vascular congestion with small bilateral left greater than right pleural effusions. No acute nor suspicious osseous abnormalities. Degenerative changes are noted along the dorsal spine. IMPRESSION: Status post CABG. Stable cardiomegaly with mild CHF and small left greater than right pleural effusions. Electronically Signed   By: Ashley Royalty M.D.   On: 02/25/2017 20:14    Procedures Procedures (including critical care time)  Medications Ordered in ED Medications  metoCLOPramide (REGLAN) tablet 10 mg (10 mg Oral Given 02/25/17 1933)  furosemide (LASIX) injection 40 mg (40 mg Intravenous Given 02/25/17 2343)     Initial Impression / Assessment and Plan / ED Course  I have reviewed the triage vital signs and the nursing notes.  Pertinent labs & imaging results that were available during my care of the patient were reviewed by me and considered in my medical decision making (see chart for details).    71 y.o. male presents for dyspnea and new oxygen requirement concerning for heart failure. VSS, mild tachypnea  w/o respiratory distress, sating mid-90s on 4L [place]. Doubt PE as heart failure more consistent with history and exam. Diffuse mild rales on exam, no peripheral edema,  unable to assess JVD d/t body habitus. VBG w/o hypercapnia. Trop negative. CXR mild pulmonary edema. BNP elevated 349. EKG sinus rhythm, diffuse TWI c/w previous studies, no ST changes. Will give IV lasix and admit for further care.   Final Clinical Impressions(s) / ED Diagnoses   Final diagnoses:  Acute pulmonary edema (HCC)  Shortness of breath    New Prescriptions New Prescriptions   No medications on file     Monico Blitz, MD 02/26/17 [place], MD 02/26/17 [phone]

## 2017-02-25 NOTE — H&P (Addendum)
History and Physical    Jeremiah [name] NFA:[id number] DOB: 07-Oct-1946 DOA: 02/25/2017  Referring MD/NP/PA: Dr. Raquel James (resident) PCP: [name], MD  Patient coming from: PCP via EMS  Chief Complaint: Weakness  HPI: Jeremiah [name] is a 71 y.o. male with medical history significant of HTN, CAD s/p CABG, SVT/AVNRT, OSA; who presents with complaints of generalized weakness and nausea over the week or slightly longer. At baseline patient does not require home oxygen. He reports feeling weak ever since being hospitalized 1/31- 2/3 for a GI bleed with enteritis secondary to C. difficile. During the hospitalization he was not scoped as there was concern for COPD. Thereafter patient follow-up with Dr. Melvyn Novas of pulmonology and it appears spirometry readings showed signs for COPD. He report that he is being set up for further testing next month.  Associated symptoms nausea, shortness of breath exertion, insomnia, and orthopnea. Furthermore, he also noted recently having a rash and issue with his eye for which he had has been on antibiotics that he just recently stopped last week. Patient initially thought nausea symptoms were secondary to the antibiotics, but symptoms  persisted. He followed up with his PCP today for further evaluation. During the evaluation he was noted to have O2 saturations as low as 83% on room air while in his PCP office. At baseline patient does not require oxygen.  ED Course: Upon admission into the emergency department patient was noted to be afebrile, pulse 60-78, respirations 14-25, O2 saturations 94-97% on 2-3 L nasal cannula oxygen, and blood pressures maintained. Labs revealed WBC 6.6, hemoglobin 8.1, sodium 1:30, potassium 5.3, chloride 92, BNP 349.2, and troponin 0.0. Chest x-ray showed cardiomegaly with mild CHF and bilateral pleural effusions.  Review of Systems: As per HPI otherwise 10 point review of systems negative.   Past Medical History:  Diagnosis Date  . A-fib  ([place])   . CAD (coronary artery disease)    a. 2006: CABG in 2006 with LIMA-LAD, SVG-OM1, and SVG-RPDA  . Chronic pain 03/21/2016  . Essential hypertension 03/21/2016  . History of prostate cancer 03/21/2016  . HLD (hyperlipidemia)   . HTN (hypertension)   . Hx of CABG 2006  . Hypercholesteremia 03/21/2016  . Hypothyroidism   . Medication management 03/21/2016  . Morbid obesity ([place]) 03/21/2016  . Myocardial infarct   . OSA (obstructive sleep apnea) 03/21/2016  . Pain in right ankle and joints of right foot 03/21/2016  . Paronychia 03/21/2016  . Primary insomnia 03/21/2016  . Prostate cancer ([place]) 2008  . Tobacco dependence 03/21/2016    Past Surgical History:  Procedure Laterality Date  . ANKLE SURGERY  12/2013  . CARDIAC CATHETERIZATION N/A 05/08/2016   Procedure: Left Heart Cath and Cors/Grafts Angiography;  Surgeon: Belva Crome, MD;  Location: [facility];  Service: Cardiovascular;  Laterality: N/A;  . CORONARY ARTERY BYPASS GRAFT  2006   x3  . ELECTROPHYSIOLOGIC STUDY N/A 08/24/2016   Procedure: SVT Ablation;  Surgeon: Will Meredith Leeds, MD;  Location: [place] [facility];  Service: Cardiovascular;  Laterality: N/A;  . PROSTATE SURGERY  2008  . PTCA       reports that he quit smoking about 2 months ago. His smoking use included Cigarettes. He has a 30.00 pack-year smoking history. He has never used smokeless tobacco. He reports that he drinks alcohol. He reports that he does not use drugs.  Allergies  Allergen Reactions  . Scallops [Shellfish Allergy] Other (See Comments)    Rash; shortness of breath  .  Fluzone [Flu Virus Vaccine] Nausea And Vomiting, Palpitations and Rash    Family History  Problem Relation Age of Onset  . Dementia Mother   . Diabetes Sister     Prior to Admission medications   Medication Sig Start Date End Date Taking? Authorizing Provider  aspirin 81 MG tablet Take 1 tablet (81 mg total) by mouth daily. Hold for 5 days Patient taking  differently: Take 81 mg by mouth daily.  01/05/17  Yes Ripudeep Krystal Eaton, MD  atorvastatin (LIPITOR) 80 MG tablet Take 80 mg by mouth daily.   Yes Historical Provider, MD  bisoprolol (ZEBETA) 5 MG tablet Take 1 tablet (5 mg total) by mouth daily. 02/12/17  Yes Tanda Rockers, MD  IRON PO Take 1 tablet by mouth daily.   Yes Historical Provider, MD  levothyroxine (SYNTHROID, LEVOTHROID) 75 MCG tablet Take 75 mcg by mouth daily before breakfast.   Yes Historical Provider, MD  pantoprazole (PROTONIX) 40 MG tablet Take 1 tablet (40 mg total) by mouth 2 (two) times daily before a meal. Take twice a day for 1 month then change to once a day in the morning Patient taking differently: Take 40 mg by mouth daily.  01/05/17  Yes Ripudeep Krystal Eaton, MD  Turmeric 500 MG TABS Take 500 mg by mouth daily.   Yes Historical Provider, MD    Physical Exam:    Constitutional:Elderly male who appears to be in some mild distress. Vitals:   02/25/17 2030 02/25/17 2130 02/25/17 2200 02/25/17 2300  BP: (!) 121/55 (!) 120/56 118/64 (!) 112/57  Pulse: 68 74 64 63  Resp: 19 20 15 16   Temp:      TempSrc:      SpO2: 94% 95% 95% 95%  Weight:      Height:       Eyes: PERRL, lids and conjunctivae normal ENMT: Mucous membranes are moist. Posterior pharynx clear of any exudate or lesions. Neck: normal, supple, no masses, no thyromegaly Respiratory: Bilateral rales noted with intermittent crackles appreciated. Regular respiratory effort.  Cardiovascular: Regular rate and rhythm, no murmurs / rubs / gallops. +2 pitting lower extremity edema. 2+ pedal pulses. No carotid bruits.  Abdomen: Protuberant abdomen, no tenderness, no masses palpated. No hepatosplenomegaly. Bowel sounds positive.  Musculoskeletal: no clubbing / cyanosis. No joint deformity upper and lower extremities. Good ROM, no contractures. Normal muscle tone.  Skin: no rashes, lesions, ulcers. No induration Neurologic: CN 2-12 grossly intact. Sensation intact, DTR  normal. Strength 5/5 in all 4.  Psychiatric: Normal judgment and insight. Alert and oriented x 3. Normal mood.     Labs on Admission: I have personally reviewed following labs and imaging studies  CBC:  Recent Labs Lab 02/25/17 1931 02/25/17 1952  WBC 6.6  --   NEUTROABS 3.7  --   HGB 8.1* 9.2*  HCT 28.8* 27.0*  MCV 88.9  --   PLT 304  --    Basic Metabolic Panel:  Recent Labs Lab 02/25/17 1931 02/25/17 1952  NA 130* 135  K 5.3* 4.2  CL 92* 94*  CO2 29  --   GLUCOSE 109* 117*  BUN 16 23*  CREATININE 1.06 1.10  CALCIUM 8.3*  --    GFR: Estimated Creatinine Clearance: 77.1 mL/min (by C-G formula based on SCr of 1.1 mg/dL). Liver Function Tests:  Recent Labs Lab 02/25/17 1931  AST 91*  ALT 24  ALKPHOS 54  BILITOT 1.2  PROT 6.3*  ALBUMIN 3.5   No results for  input(s): LIPASE, AMYLASE in the last 168 hours. No results for input(s): AMMONIA in the last 168 hours. Coagulation Profile: No results for input(s): INR, PROTIME in the last 168 hours. Cardiac Enzymes: No results for input(s): CKTOTAL, CKMB, CKMBINDEX, TROPONINI in the last 168 hours. BNP (last 3 results) No results for input(s): PROBNP in the last 8760 hours. HbA1C: No results for input(s): HGBA1C in the last 72 hours. CBG: No results for input(s): GLUCAP in the last 168 hours. Lipid Profile: No results for input(s): CHOL, HDL, LDLCALC, TRIG, CHOLHDL, LDLDIRECT in the last 72 hours. Thyroid Function Tests: No results for input(s): TSH, T4TOTAL, FREET4, T3FREE, THYROIDAB in the last 72 hours. Anemia Panel: No results for input(s): VITAMINB12, FOLATE, FERRITIN, TIBC, IRON, RETICCTPCT in the last 72 hours. Urine analysis: No results found for: COLORURINE, APPEARANCEUR, LABSPEC, PHURINE, GLUCOSEU, HGBUR, BILIRUBINUR, KETONESUR, PROTEINUR, UROBILINOGEN, NITRITE, LEUKOCYTESUR Sepsis Labs: No results found for this or any previous visit (from the past 240 hour(s)).   Radiological Exams on  Admission: Dg Chest 2 View  Result Date: 02/25/2017 CLINICAL DATA:  Dyspnea EXAM: CHEST  2 VIEW COMPARISON:  01/03/2017 FINDINGS: The patient is status post median sternotomy. Cardiac silhouette is borderline enlarged with aortic atherosclerosis. No aneurysm. Mild vascular congestion with small bilateral left greater than right pleural effusions. No acute nor suspicious osseous abnormalities. Degenerative changes are noted along the dorsal spine. IMPRESSION: Status post CABG. Stable cardiomegaly with mild CHF and small left greater than right pleural effusions. Electronically Signed   By: Ashley Royalty M.D.   On: 02/25/2017 20:14    EKG: Independently reviewed.  Normal sinus rhythm with initial QTC noted to be 574  Assessment/Plan Acute exacerbation of congestive heart failure ([place]): Acute. Patient presents with shortness of breath,  orthopnea, lower extremity edema, - Admit to a telemetry bed - Heart failure orders set  initiated  - Continuous pulse oximetry with nasal cannula oxygen as needed to keep O2 saturations >92% - Strict I&Os and daily weights - Elevate lower extremities - Lasix 40 mg IV Bid - Reassess in a.m. and adjust diuresis as needed. - Check echocardiogram - Optimize medical management  - Will need cardiology consult in a.m.   Acute respiratory failure with hypoxia: O2 saturations noted to be as low as 83% on room air at PCP office. - Continuous pulse oximetry with nasal cannula oxygen to keep O2 sat greater than 92% - DuoNeb's prn SOB  Prolonged QTC: Initial QTC noted to be 574 - Recheck EKG in a.m.   Hyperkalemia: Initial potassium 5.3. Given 40 mEq of Lasix - Repeat BMP in a.m.  Hypothyroidism - check TSH - Continue levothyroxine  Nausea: Patient was given 10 mg of Reglan while in the ED. - Avoiding any antiemetics 2/2 prolonged QTC   History of SVT/ NARVT: Patient reports being status post ablation  Anemia: stable. Patient's initial hemoglobin noted to be  8.1 which appears to be near his baseline.   CAD s/p CABG  Hyperlipidemia  - Continue atorvastatin  GERD - Continue Protonix  DVT prophylaxis: Lovenox    Code Status: Full Family Communication:  Discussed plan of care with patient and family present at bedside Disposition Plan: Possible discharge home in 2-3 days  Consults called: None  Admission status: observation  Norval Morton MD [facility] Pager [phone]  If 7PM-7AM, please contact night-coverage [email or web address] Password [email or web address]  02/25/2017, 11:24 PM

## 2017-02-25 NOTE — ED Triage Notes (Signed)
Pt via EMS from PCP with SOB x 2 days. Per EMS, pt with increased SOB over the weekend, pt seen at his doctor's today with initial O2 sat of 83%, pt placed on 2L and O2 increased to 94%. 10 mg albuterol and 0.5 mg of atrovent given en route. Per EMS, wheezes auscultated and PCP concerned for pulmonary edema. Dyspnea on exertion, pt denies CP. 12-lead unremarkable. 20 G L AC.  176/85, HR 73, end tidal 45, O2 98% on 2 L. A&Ox4.

## 2017-02-25 NOTE — ED Notes (Signed)
Pt transported to xray, cleared by primary RN.

## 2017-02-25 NOTE — ED Notes (Signed)
ED Provider at bedside. 

## 2017-02-26 DIAGNOSIS — I110 Hypertensive heart disease with heart failure: Secondary | ICD-10-CM | POA: Diagnosis present

## 2017-02-26 DIAGNOSIS — J441 Chronic obstructive pulmonary disease with (acute) exacerbation: Secondary | ICD-10-CM | POA: Diagnosis present

## 2017-02-26 DIAGNOSIS — I509 Heart failure, unspecified: Secondary | ICD-10-CM

## 2017-02-26 DIAGNOSIS — Z79899 Other long term (current) drug therapy: Secondary | ICD-10-CM | POA: Diagnosis not present

## 2017-02-26 DIAGNOSIS — R195 Other fecal abnormalities: Secondary | ICD-10-CM | POA: Diagnosis not present

## 2017-02-26 DIAGNOSIS — K921 Melena: Secondary | ICD-10-CM | POA: Diagnosis not present

## 2017-02-26 DIAGNOSIS — D649 Anemia, unspecified: Secondary | ICD-10-CM | POA: Diagnosis not present

## 2017-02-26 DIAGNOSIS — I4581 Long QT syndrome: Secondary | ICD-10-CM | POA: Diagnosis present

## 2017-02-26 DIAGNOSIS — J9601 Acute respiratory failure with hypoxia: Secondary | ICD-10-CM | POA: Diagnosis not present

## 2017-02-26 DIAGNOSIS — Z8546 Personal history of malignant neoplasm of prostate: Secondary | ICD-10-CM | POA: Diagnosis not present

## 2017-02-26 DIAGNOSIS — Z87891 Personal history of nicotine dependence: Secondary | ICD-10-CM | POA: Diagnosis not present

## 2017-02-26 DIAGNOSIS — E7800 Pure hypercholesterolemia, unspecified: Secondary | ICD-10-CM | POA: Diagnosis not present

## 2017-02-26 DIAGNOSIS — R9431 Abnormal electrocardiogram [ECG] [EKG]: Secondary | ICD-10-CM | POA: Diagnosis present

## 2017-02-26 DIAGNOSIS — G4733 Obstructive sleep apnea (adult) (pediatric): Secondary | ICD-10-CM | POA: Diagnosis present

## 2017-02-26 DIAGNOSIS — R0602 Shortness of breath: Secondary | ICD-10-CM

## 2017-02-26 DIAGNOSIS — E039 Hypothyroidism, unspecified: Secondary | ICD-10-CM | POA: Diagnosis present

## 2017-02-26 DIAGNOSIS — R531 Weakness: Secondary | ICD-10-CM | POA: Diagnosis present

## 2017-02-26 DIAGNOSIS — Z951 Presence of aortocoronary bypass graft: Secondary | ICD-10-CM | POA: Diagnosis not present

## 2017-02-26 DIAGNOSIS — J9611 Chronic respiratory failure with hypoxia: Secondary | ICD-10-CM

## 2017-02-26 DIAGNOSIS — Z7982 Long term (current) use of aspirin: Secondary | ICD-10-CM | POA: Diagnosis not present

## 2017-02-26 DIAGNOSIS — D751 Secondary polycythemia: Secondary | ICD-10-CM | POA: Diagnosis present

## 2017-02-26 DIAGNOSIS — E785 Hyperlipidemia, unspecified: Secondary | ICD-10-CM | POA: Diagnosis present

## 2017-02-26 DIAGNOSIS — I5043 Acute on chronic combined systolic (congestive) and diastolic (congestive) heart failure: Secondary | ICD-10-CM | POA: Diagnosis not present

## 2017-02-26 DIAGNOSIS — D509 Iron deficiency anemia, unspecified: Secondary | ICD-10-CM | POA: Diagnosis not present

## 2017-02-26 DIAGNOSIS — I2510 Atherosclerotic heart disease of native coronary artery without angina pectoris: Secondary | ICD-10-CM | POA: Diagnosis present

## 2017-02-26 DIAGNOSIS — G8929 Other chronic pain: Secondary | ICD-10-CM | POA: Diagnosis present

## 2017-02-26 DIAGNOSIS — Z91013 Allergy to seafood: Secondary | ICD-10-CM | POA: Diagnosis not present

## 2017-02-26 DIAGNOSIS — J810 Acute pulmonary edema: Secondary | ICD-10-CM | POA: Diagnosis not present

## 2017-02-26 DIAGNOSIS — E875 Hyperkalemia: Secondary | ICD-10-CM | POA: Diagnosis present

## 2017-02-26 DIAGNOSIS — Z6838 Body mass index (BMI) 38.0-38.9, adult: Secondary | ICD-10-CM | POA: Diagnosis not present

## 2017-02-26 DIAGNOSIS — I472 Ventricular tachycardia: Secondary | ICD-10-CM | POA: Diagnosis not present

## 2017-02-26 DIAGNOSIS — I5033 Acute on chronic diastolic (congestive) heart failure: Secondary | ICD-10-CM | POA: Diagnosis not present

## 2017-02-26 DIAGNOSIS — D500 Iron deficiency anemia secondary to blood loss (chronic): Secondary | ICD-10-CM | POA: Diagnosis not present

## 2017-02-26 LAB — CBC WITH DIFFERENTIAL/PLATELET
Basophils Absolute: 0 10*3/uL (ref 0.0–0.1)
Basophils Relative: 0 %
EOS PCT: 0 %
Eosinophils Absolute: 0 10*3/uL (ref 0.0–0.7)
HEMATOCRIT: 30.6 % — AB (ref 39.0–52.0)
HEMOGLOBIN: 8.3 g/dL — AB (ref 13.0–17.0)
LYMPHS ABS: 1 10*3/uL (ref 0.7–4.0)
LYMPHS PCT: 21 %
MCH: 24.6 pg — AB (ref 26.0–34.0)
MCHC: 27.1 g/dL — ABNORMAL LOW (ref 30.0–36.0)
MCV: 90.8 fL (ref 78.0–100.0)
Monocytes Absolute: 0.6 10*3/uL (ref 0.1–1.0)
Monocytes Relative: 12 %
NEUTROS ABS: 3.3 10*3/uL (ref 1.7–7.7)
Neutrophils Relative %: 67 %
PLATELETS: 247 10*3/uL (ref 150–400)
RBC: 3.37 MIL/uL — AB (ref 4.22–5.81)
RDW: 20 % — ABNORMAL HIGH (ref 11.5–15.5)
WBC: 5 10*3/uL (ref 4.0–10.5)

## 2017-02-26 LAB — URINALYSIS, ROUTINE W REFLEX MICROSCOPIC
BILIRUBIN URINE: NEGATIVE
Glucose, UA: NEGATIVE mg/dL
Hgb urine dipstick: NEGATIVE
Ketones, ur: NEGATIVE mg/dL
Leukocytes, UA: NEGATIVE
NITRITE: NEGATIVE
PH: 6 (ref 5.0–8.0)
Protein, ur: NEGATIVE mg/dL
SPECIFIC GRAVITY, URINE: 1.003 — AB (ref 1.005–1.030)

## 2017-02-26 LAB — TROPONIN I: Troponin I: <0.03 ng/mL (ref <0.03)

## 2017-02-26 LAB — TSH: TSH: 2.448 u[IU]/mL (ref 0.350–4.500)

## 2017-02-26 MED ORDER — FUROSEMIDE 10 MG/ML IJ SOLN
40.0000 mg | Freq: Two times a day (BID) | INTRAMUSCULAR | Status: DC
  Administered 2017-02-26 – 2017-03-01 (×7): 40 mg via INTRAVENOUS
  Filled 2017-02-26 (×7): qty 4

## 2017-02-26 MED ORDER — LEVOTHYROXINE SODIUM 75 MCG PO TABS
75.0000 ug | ORAL_TABLET | Freq: Every day | ORAL | Status: DC
  Administered 2017-02-27 – 2017-03-02 (×4): 75 ug via ORAL
  Filled 2017-02-26 (×5): qty 1

## 2017-02-26 MED ORDER — SODIUM CHLORIDE 0.9 % IV SOLN: 250.0000 mL | INTRAVENOUS | Status: DC | PRN

## 2017-02-26 MED ORDER — TURMERIC 500 MG PO TABS: 500.0000 mg | ORAL_TABLET | Freq: Every day | ORAL | Status: DC

## 2017-02-26 MED ORDER — ACETAMINOPHEN 325 MG PO TABS
650.0000 mg | ORAL_TABLET | ORAL | Status: DC | PRN
  Administered 2017-02-27: 650 mg via ORAL
  Filled 2017-02-26: qty 2

## 2017-02-26 MED ORDER — LEVOTHYROXINE SODIUM 75 MCG PO TABS: 75.0000 ug | ORAL_TABLET | Freq: Every day | ORAL | Status: DC

## 2017-02-26 MED ORDER — BISOPROLOL FUMARATE 5 MG PO TABS
5.0000 mg | ORAL_TABLET | Freq: Every day | ORAL | Status: DC
  Administered 2017-02-26 – 2017-03-02 (×5): 5 mg via ORAL
  Filled 2017-02-26 (×5): qty 1

## 2017-02-26 MED ORDER — ASPIRIN 81 MG PO CHEW
81.0000 mg | CHEWABLE_TABLET | Freq: Every day | ORAL | Status: DC
  Administered 2017-02-26 – 2017-03-02 (×5): 81 mg via ORAL
  Filled 2017-02-26 (×5): qty 1

## 2017-02-26 MED ORDER — FERROUS SULFATE 325 (65 FE) MG PO TABS
325.0000 mg | ORAL_TABLET | Freq: Every day | ORAL | Status: DC
  Administered 2017-02-26 – 2017-03-02 (×5): 325 mg via ORAL
  Filled 2017-02-26 (×5): qty 1

## 2017-02-26 MED ORDER — ENOXAPARIN SODIUM 40 MG/0.4ML ~~LOC~~ SOLN
40.0000 mg | SUBCUTANEOUS | Status: DC
Start: 2017-02-26 — End: 2017-02-26
  Filled 2017-02-26: qty 0.4

## 2017-02-26 MED ORDER — GI COCKTAIL ~~LOC~~
30.0000 mL | Freq: Once | ORAL | Status: AC
  Administered 2017-02-26: 30 mL via ORAL
  Filled 2017-02-26: qty 30

## 2017-02-26 MED ORDER — FLUTICASONE PROPIONATE 50 MCG/ACT NA SUSP
1.0000 | Freq: Every day | NASAL | Status: DC
  Administered 2017-02-26 – 2017-03-02 (×5): 1 via NASAL
  Filled 2017-02-26 (×2): qty 16

## 2017-02-26 MED ORDER — ONDANSETRON HCL 4 MG/2ML IJ SOLN: 4.0000 mg | Freq: Four times a day (QID) | INTRAMUSCULAR | Status: DC | PRN

## 2017-02-26 MED ORDER — IPRATROPIUM-ALBUTEROL 0.5-2.5 (3) MG/3ML IN SOLN: 3.0000 mL | RESPIRATORY_TRACT | Status: DC | PRN

## 2017-02-26 MED ORDER — SODIUM CHLORIDE 0.9% FLUSH: 3.0000 mL | INTRAVENOUS | Status: DC | PRN

## 2017-02-26 MED ORDER — SODIUM CHLORIDE 0.9% FLUSH
3.0000 mL | Freq: Two times a day (BID) | INTRAVENOUS | Status: DC
  Administered 2017-02-26 – 2017-03-02 (×10): 3 mL via INTRAVENOUS

## 2017-02-26 MED ORDER — PANTOPRAZOLE SODIUM 40 MG PO TBEC
40.0000 mg | DELAYED_RELEASE_TABLET | Freq: Every day | ORAL | Status: DC
  Administered 2017-02-26 – 2017-03-02 (×5): 40 mg via ORAL
  Filled 2017-02-26 (×5): qty 1

## 2017-02-26 MED ORDER — ATORVASTATIN CALCIUM 80 MG PO TABS
80.0000 mg | ORAL_TABLET | Freq: Every day | ORAL | Status: DC
  Administered 2017-02-26 – 2017-03-02 (×5): 80 mg via ORAL
  Filled 2017-02-26 (×5): qty 1

## 2017-02-26 NOTE — Care Management Note (Addendum)
Case Management Note  Patient Details  Name: Jeremiah [name] MRN: [id number] Date of Birth: November 14, 1946  Subjective/Objective:                 Spoke with patient at the bedside. He states he lives at home with his wife. They moved to [place] from [place] for retirement. He describes himself as independent at home. Does not use DME for ambulation. He states he has a CPAP machine. Currently diuresing and requiring supplemental O2. He denies difficulties getting his medications.  PCP [employer] [address]   Action/Plan:  CM will continue to follow for DC needs.   Addedndum 3/31 D Dontrail Blackwell RN CM Patient to have oxygen for transport delivered to room by [facility]. Referral made to [place], [facility] liaison.   Expected Discharge Date:                  Expected Discharge Plan:     In-House Referral:     Discharge planning Services  CM Consult  Post Acute Care Choice:    Choice offered to:     DME Arranged:    DME Agency:     HH Arranged:    HH Agency:     Status of Service:  In process, will continue to follow  If discussed at Long Length of Stay Meetings, dates discussed:    Additional Comments:  Carles Collet, RN 02/26/2017, 11:43 AM

## 2017-02-26 NOTE — ED Notes (Signed)
Admitting provider at bedside.

## 2017-02-26 NOTE — Progress Notes (Signed)
PROGRESS NOTE    Jeremiah [name]  FAO:[id number] DOB: [birth year] DOA: 02/25/2017 PCP: [name], MD   Chief Complaint  Patient presents with  . Shortness of Breath     Brief Narrative:  HPI 02/25/2017 by Dr. Fuller Plan Jeremiah [name] is a 71 y.o. male with medical history significant of HTN, CAD s/p CABG, SVT/AVNRT, OSA; who presents with complaints of generalized weakness and nausea over the week or slightly longer. At baseline patient does not require home oxygen. He reports feeling weak ever since being hospitalized 1/31- 2/3 for a GI bleed with enteritis secondary to C. difficile. During the hospitalization he was not scoped as there was concern for COPD. Thereafter patient follow-up with Dr. Melvyn Novas of pulmonology and it appears spirometry readings showed signs for COPD. He report that he is being set up for further testing next month.  Associated symptoms nausea, shortness of breath exertion, insomnia, and orthopnea. Furthermore, he also noted recently having a rash and issue with his eye for which he had has been on antibiotics that he just recently stopped last week. Patient initially thought nausea symptoms were secondary to the antibiotics, but symptoms  persisted. He followed up with his PCP today for further evaluation. During the evaluation he was noted to have O2 saturations as low as 83% on room air while in his PCP office. At baseline patient does not require oxygen. Assessment & Plan   Acute CHF exacerbation -Possibly diastolic. Last cath showed EF 50% in June 2017 -BNP 349.2 -Patient presented with SOB, LE edema, orthopnea. He does not check his weight at home, and is not on diuretics -CXR mild CHF with small pleural eff (L>R) -monitor strict Intake/output, daily weights -Continue IV lasix -Echocardiogram pending -Cardiology consulted and appreciated  Acute respiratory failure with hypoxia -Likely secondary to the above -O2 saturations noted to be as low as 83% on room air at  PCP office -Continue supplemental O2 to maintain sats  >92% -Will add on humidified air and flonase for nasal congestion  -Continue nebs PRN  Prolonged QTC -Initial QTC noted to be 574 -Rechecked this morning, 453   Hyperkalemia -Resolved, continue to monitor BMP  Hypothyroidism -TSH 2.448 -Continue levothyroxine  Nausea -Seems to have improved -Avoiding antiemetics 2/2 prolonged QTC  -Was given Reglan in the ER  History of SVT/ NARVT -Patient reports being status post ablation  Chronic Anemia -baseline hemoglobin around 8, currently 8.3 -continue iron supplementation  -continue to monitor CBC  CAD s/p CABG -no complaints of chest pain -Continue aspirin, zebeta, statin  Hyperlipidemia  -Continue atorvastatin  GERD -Continue Protonix  DVT Prophylaxis  lovenox  Code Status: Full  Family Communication: None at bedside  Disposition Plan: Observation. Pending cardiology consult, echocardiogram, improvement in respiratory status.  Consultants Cardiology  Procedures  None  Antibiotics   Anti-infectives    None      Subjective:   Jeremiah [name] seen and examined today.  Continues to feel short of breath, however, better than on admission. Feels leg swelling has improved. Denies chest pain, cough, abdominal pain, further nausea, vomiting, diarrhea, constipation.   Objective:   Vitals:   02/26/17 0030 02/26/17 0100 02/26/17 0146 02/26/17 0600  BP: 138/72 (!) 143/70 98/66 123/65  Pulse: 62 75 71 85  Resp:   18 18  Temp:   97.8 F (36.6 C) 98 F (36.7 C)  TempSrc:   Oral Oral  SpO2: 96% 97% 96% 96%  Weight:   112.4 kg (247 lb 12.8 oz)  Height:   5' 7.5" (1.715 m)     Intake/Output Summary (Last 24 hours) at 02/26/17 0855 Last data filed at 02/26/17 0500  Gross per 24 hour  Intake                0 ml  Output             1850 ml  Net            -1850 ml   Filed Weights   02/25/17 1854 02/26/17 0146  Weight: 112.5 kg (248 lb) 112.4 kg (247  lb 12.8 oz)    Exam  General: Well developed, well nourished, NAD, appears stated age  HEENT: NCAT, mucous membranes moist.   Neck: Supple, no masses  Cardiovascular: S1 S2 auscultated, no rubs, murmurs or gallops. Regular rate and rhythm.  Respiratory: Clear to auscultation bilaterally with equal chest rise  Abdomen: Soft, obese, nontender, nondistended, + bowel sounds  Extremities: warm dry without cyanosis clubbing. Trace LE edema.  Neuro: AAOx3, nonfocal  Psych: Normal affect and demeanor with intact judgement and insight   Data Reviewed: I have personally reviewed following labs and imaging studies  CBC:  Recent Labs Lab 02/25/17 1931 02/25/17 1952  WBC 6.6  --   NEUTROABS 3.7  --   HGB 8.1* 9.2*  HCT 28.8* 27.0*  MCV 88.9  --   PLT 304  --    Basic Metabolic Panel:  Recent Labs Lab 02/25/17 1931 02/25/17 1952  NA 130* 135  K 5.3* 4.2  CL 92* 94*  CO2 29  --   GLUCOSE 109* 117*  BUN 16 23*  CREATININE 1.06 1.10  CALCIUM 8.3*  --    GFR: Estimated Creatinine Clearance: 76.5 mL/min (by C-G formula based on SCr of 1.1 mg/dL). Liver Function Tests:  Recent Labs Lab 02/25/17 1931  AST 91*  ALT 24  ALKPHOS 54  BILITOT 1.2  PROT 6.3*  ALBUMIN 3.5   No results for input(s): LIPASE, AMYLASE in the last 168 hours. No results for input(s): AMMONIA in the last 168 hours. Coagulation Profile: No results for input(s): INR, PROTIME in the last 168 hours. Cardiac Enzymes:  Recent Labs Lab 02/26/17 0115  TROPONINI <0.03   BNP (last 3 results) No results for input(s): PROBNP in the last 8760 hours. HbA1C: No results for input(s): HGBA1C in the last 72 hours. CBG: No results for input(s): GLUCAP in the last 168 hours. Lipid Profile: No results for input(s): CHOL, HDL, LDLCALC, TRIG, CHOLHDL, LDLDIRECT in the last 72 hours. Thyroid Function Tests: No results for input(s): TSH, T4TOTAL, FREET4, T3FREE, THYROIDAB in the last 72 hours. Anemia  Panel: No results for input(s): VITAMINB12, FOLATE, FERRITIN, TIBC, IRON, RETICCTPCT in the last 72 hours. Urine analysis:    Component Value Date/Time   COLORURINE COLORLESS (A) 02/26/2017 0104   APPEARANCEUR CLEAR 02/26/2017 0104   LABSPEC 1.003 (L) 02/26/2017 0104   PHURINE 6.0 02/26/2017 0104   GLUCOSEU NEGATIVE 02/26/2017 0104   HGBUR NEGATIVE 02/26/2017 0104   BILIRUBINUR NEGATIVE 02/26/2017 0104   KETONESUR NEGATIVE 02/26/2017 0104   PROTEINUR NEGATIVE 02/26/2017 0104   NITRITE NEGATIVE 02/26/2017 0104   LEUKOCYTESUR NEGATIVE 02/26/2017 0104   Sepsis Labs: @LABRCNTIP (procalcitonin:4,lacticidven:4)  )No results found for this or any previous visit (from the past 240 hour(s)).    Radiology Studies: Dg Chest 2 View  Result Date: 02/25/2017 CLINICAL DATA:  Dyspnea EXAM: CHEST  2 VIEW COMPARISON:  01/03/2017 FINDINGS: The patient is status post median  sternotomy. Cardiac silhouette is borderline enlarged with aortic atherosclerosis. No aneurysm. Mild vascular congestion with small bilateral left greater than right pleural effusions. No acute nor suspicious osseous abnormalities. Degenerative changes are noted along the dorsal spine. IMPRESSION: Status post CABG. Stable cardiomegaly with mild CHF and small left greater than right pleural effusions. Electronically Signed   By: Ashley Royalty M.D.   On: 02/25/2017 20:14     Scheduled Meds: . aspirin  81 mg Oral Daily  . atorvastatin  80 mg Oral Daily  . bisoprolol  5 mg Oral Daily  . enoxaparin (LOVENOX) injection  40 mg Subcutaneous Q24H  . ferrous sulfate  325 mg Oral Daily  . fluticasone  1 spray Each Nare Daily  . furosemide  40 mg Intravenous BID  . [START ON 02/27/2017] levothyroxine  75 mcg Oral QAC breakfast  . pantoprazole  40 mg Oral Daily  . sodium chloride flush  3 mL Intravenous Q12H   Continuous Infusions:   LOS: 1 day   Time Spent in minutes   30 minutes  Jeremiah [name] D.O. on 02/26/2017 at 8:55  AM  Between 7am to 7pm - Pager - [phone]  After 7pm go to [email or web address] - password [email or web address]  And look for the night coverage person covering for me after hours  [facility] Office  [phone]

## 2017-02-26 NOTE — Progress Notes (Signed)
New pt admission from ED. Pt brought to the floor in stable condition. Vitals taken. Initial Assessment done. All immediate pertinent needs to patient addressed. Patient Guide given to patient. Important safety instructions relating to hospitalization reviewed with patient. Patient verbalized understanding. Will continue to monitor pt. 

## 2017-02-26 NOTE — Progress Notes (Signed)
Per Pt. Manus Rudd has been done. Offered Pt a clean gown, at first he said yes once tech returned with gown Pt stated he rather keep the one he has on.

## 2017-02-26 NOTE — ED Notes (Signed)
Pt given gingerale, per pt wife left to get subway for dinner.

## 2017-02-26 NOTE — Progress Notes (Addendum)
Cardiology Consult    Patient ID: Jeremiah [name] MRN: [id number], DOB/AGE: 05-Feb-1946   Admit date: 02/25/2017 Date of Consult: 02/26/2017  Primary Physician: [name], MD Reason for Consult: CHF Primary Cardiologist: Dr. Debara Pickett Electrophysiologist: Dr. Curt Bears Requesting Provider: Dr. Ree Kida  History of Present Illness    Jeremiah [name] is a 71 y.o. with past medical history significant for MI in 2006 with CABG in [place], [place] (LIMA to LAD, SVG to OM1, SVG to RPDA), 2015-cardiac cath at [place] patent X3, 05/2016 cardiac cath-Patent SVGS and LIMA,  S/P AVNRT ablation 08/24/16. He also has history of afib, chronic pain, prostate cancer, HLD, HTN, morbid obesity BMI 38.24 kg/m, OSA, COPD, former smoker, 30 pack years (quit 12/2016). He presented to the [facility] ED from his PCP office for evaluation of weakness and nausea over the last week or so.   He has been feeling weak, lightheaded and nauseated and thought that this may be a relapse of C. Dif that he had 3 weeks ago. He is not having any further loose stools. He denies any orthopnea, PND or palpitations. He has mild lower extremity edema. Has has not been using his CPAP as he hates it. He has COPD and is scheduled for testing with Dr. Melvyn Novas, pulmonology, next month. After that and when his breathing is better he is supposed to have GI workup for anemia. Pt was taking 3-4 doses of Alka seltzer per day. He has continued   Pt has been anemic and hospitalized 1/31-2/3 for GI bleed with enteritis secondary to C. Difficile.  During the hospitalization he was not scoped as there was concern for COPD. Currently his Hgb is 8.1.  EKG: NSR 82 bpm, LAE, non-specific ST/T changes, similar to 12/2016 Troponin  0.0, <0.03, <0.03 BNP 349.2,   TSH 2.448 K+ 5.3 -->> 4.2   SCr 1.06   Hgb 8.1 (1.8-9.2 since 01/02/17) CXR: Status post CABG. Stable cardiomegaly with mild CHF and small left greater than right pleural effusions.  Most  recent cardiac cath 05/08/2016- patent SVG X3, patent LIMA. Chest pain in the setting of PSVT with minimal enzyme elevation likely related to demand ischemia.  Past Medical History   Past Medical History:  Diagnosis Date  . A-fib ([place])   . CAD (coronary artery disease)    a. 2006: CABG in 2006 with LIMA-LAD, SVG-OM1, and SVG-RPDA  . Chronic pain 03/21/2016  . Essential hypertension 03/21/2016  . History of prostate cancer 03/21/2016  . HLD (hyperlipidemia)   . HTN (hypertension)   . Hx of CABG 2006  . Hypercholesteremia 03/21/2016  . Hypothyroidism   . Medication management 03/21/2016  . Morbid obesity ([place]) 03/21/2016  . Myocardial infarct   . OSA (obstructive sleep apnea) 03/21/2016  . Pain in right ankle and joints of right foot 03/21/2016  . Paronychia 03/21/2016  . Primary insomnia 03/21/2016  . Prostate cancer ([place]) 2008  . Tobacco dependence 03/21/2016    Past Surgical History:  Procedure Laterality Date  . ANKLE SURGERY  12/2013  . CARDIAC CATHETERIZATION N/A 05/08/2016   Procedure: Left Heart Cath and Cors/Grafts Angiography;  Surgeon: Belva Crome, MD;  Location: [facility];  Service: Cardiovascular;  Laterality: N/A;  . CORONARY ARTERY BYPASS GRAFT  2006   x3  . ELECTROPHYSIOLOGIC STUDY N/A 08/24/2016   Procedure: SVT Ablation;  Surgeon: Will Meredith Leeds, MD;  Location: [facility];  Service: Cardiovascular;  Laterality: N/A;  . PROSTATE SURGERY  2008  .  PTCA       Allergies  Allergies  Allergen Reactions  . Scallops [Shellfish Allergy] Other (See Comments)    Rash; shortness of breath  . Fluzone [Flu Virus Vaccine] Nausea And Vomiting, Palpitations and Rash    Inpatient Medications    . aspirin  81 mg Oral Daily  . atorvastatin  80 mg Oral Daily  . bisoprolol  5 mg Oral Daily  . enoxaparin (LOVENOX) injection  40 mg Subcutaneous Q24H  . ferrous sulfate  325 mg Oral Daily  . fluticasone  1 spray Each Nare Daily  . furosemide  40 mg Intravenous  BID  . [START ON 02/27/2017] levothyroxine  75 mcg Oral QAC breakfast  . pantoprazole  40 mg Oral Daily  . sodium chloride flush  3 mL Intravenous Q12H    Family History    Family History  Problem Relation Age of Onset  . Dementia Mother   . Diabetes Sister     Social History    Social History   Social History  . Marital status: Married    Spouse name: N/A  . Number of children: 0  . Years of education: N/A   Occupational History  . retired - Passenger transport manager    Social History Main Topics  . Smoking status: Former Smoker    Packs/day: 0.50    Years: 60.00    Types: Cigarettes    Quit date: 12/15/2016  . Smokeless tobacco: Never Used  . Alcohol use Yes     Comment: very seldom  . Drug use: No  . Sexual activity: Not on file   Other Topics Concern  . Not on file   Social History Narrative   Retired, married, no children      epworth sleepiness scale = 9 (04/13/16) (has OSA)     Review of Systems   General:  No chills, fever, night sweats or weight changes.  Cardiovascular:  No chest pain, dyspnea on exertion, orthopnea, palpitations, paroxysmal nocturnal dyspnea. Positive for mild lower leg edema Dermatological: No rash, lesions/masses Respiratory: No cough, dyspnea, positive for wheezing. Urologic: No hematuria, dysuria Abdominal:   No nausea, vomiting, diarrhea, bright red blood per rectum, melena, or hematemesis Neurologic:  No visual changes, wkns, changes in mental status. All other systems reviewed and are otherwise negative except as noted above.  Physical Exam   Blood pressure (!) 117/94, pulse 81, temperature 98.6 F (37 C), temperature source Oral, resp. rate 20, height 5' 7.5" (1.715 m), weight 247 lb 12.8 oz (112.4 kg), SpO2 94 %.  General: Pleasant, NAD Psych: Normal affect. Neuro: Alert and oriented X 3. Moves all extremities spontaneously. HEENT: Normal  Neck: Supple without bruits or JVD. Lungs:  Resp regular and unlabored, Expiratory wheezes  bilaterally Heart: RRR no s3, s4, or murmurs. Abdomen: Soft, non-tender, rounded-obese, BS + x 4.  Extremities: No clubbing, cyanosis. Trace lower leg edema DP/PT/Radials 2+ and equal bilaterally.  Labs    Troponin [facility] Test)  Recent Labs  02/25/17 1950  TROPIPOC 0.00    Recent Labs  02/26/17 0115 02/26/17 0942  TROPONINI <0.03 <0.03   Lab Results  Component Value Date   WBC 5.0 02/26/2017   HGB 8.3 (L) 02/26/2017   HCT 30.6 (L) 02/26/2017   MCV 90.8 02/26/2017   PLT 247 02/26/2017    Recent Labs Lab 02/25/17 1931 02/25/17 1952  NA 130* 135  K 5.3* 4.2  CL 92* 94*  CO2 29  --   BUN  16 23*  CREATININE 1.06 1.10  CALCIUM 8.3*  --   PROT 6.3*  --   BILITOT 1.2  --   ALKPHOS 54  --   ALT 24  --   AST 91*  --   GLUCOSE 109* 117*   No results found for: CHOL, HDL, LDLCALC, TRIG No results found for: DDIMER   Radiology Studies    Dg Chest 2 View  Result Date: 02/25/2017 CLINICAL DATA:  Dyspnea EXAM: CHEST  2 VIEW COMPARISON:  01/03/2017 FINDINGS: The patient is status post median sternotomy. Cardiac silhouette is borderline enlarged with aortic atherosclerosis. No aneurysm. Mild vascular congestion with small bilateral left greater than right pleural effusions. No acute nor suspicious osseous abnormalities. Degenerative changes are noted along the dorsal spine. IMPRESSION: Status post CABG. Stable cardiomegaly with mild CHF and small left greater than right pleural effusions. Electronically Signed   By: Ashley Royalty M.D.   On: 02/25/2017 20:14    EKG & Cardiac Imaging    EKG: NSR 82 bpm, LAE, non-specific ST/T changes, similar to 12/2016   Echocardiogram: Pending _____________________________________________________________________________________  SVT ablation 08/24/2016 CONCLUSIONS:  1. Sinus rhythm upon presentation.  2. The patient had dual AV nodal physiology with easily inducible classic AV nodal reentrant tachycardia, there were no other  accessory pathways or arrhythmias induced  3. Successful radiofrequency modification of the slow AV nodal pathway  4. No inducible arrhythmias following ablation.  5. No early apparent complications.  ___________________________________________________________________________________  Last cardiac cath 05/08/2016 Conclusion   1. LM lesion, 85% stenosed. 2. Ost 1st Mrg lesion, 100% stenosed. 3. SVG was injected . 4. Ost RCA to Prox RCA lesion, 100% stenosed. 5. SVG . 6. Origin to Prox Graft lesion, 40% stenosed. 7. Mid RCA to Dist RCA lesion, 100% stenosed. 8. Prox LAD lesion, 100% stenosed. 9. LIMA .    Patent saphenous vein grafts. SVG to PDA contains eccentric 40% proximal stenosis. SVG to the circumflex is widely patent.  LIMA to the LAD is widely patent.  Native distal left main contains 75% stenosis. The dominant obtuse marginal branch is totally occluded. The native right coronary is totally occluded.  Left ventriculogram demonstrates inferior wall hypokinesis. EF 50%.  Recent chest pain in the setting of PSVT with minimal enzyme elevation likely related to demand ischemia in the setting of underlying native coronary disease.  Recommendations:  Medical therapy.  Management of PSVT with medications versus ablation.     Assessment & Plan    Acute Heart failure- awaiting echo to define  -EF 50% per cath 05/2016, 2015 echo in [place] showed EF 58% and no significant valvular disease.  -BNP is elevated at 349.2 and CXR shows mild CHF with small bilateral pleural effusions.  -EKG with non-specific ST/T changes, similar to 12/2016 -Troponins negative X3 -BNP 349.2,   TSH 2.448 -K+ 5.3 -->> 4.2   SCr 1.06    -CXR: Status post CABG. Stable cardiomegaly with mild CHF and small left greater than right pleural effusions. -Agree with lasix 40 mg IV bid. Has had 2 doses so far.  -Wt 248->247 Last office weight (09/2016)  247.  I&O net negative 1.8L.  Acute respiratory  failure with hypoxia -O2 sat as low as 83% on room air at PCP office.  -Likely multifactorial with history of COPD, anemia, and OSA  -Currently treated by IM with nebs and supplemental oxygen- on 4L -Is followed by Dr. Melvyn Novas, pulmonology. Next appt 4/30  CAD -S/P CABG 2006. Last cath 05/2016  showed patent SVG's and LIMA -Treatment with aspirin, high intensity statin, bisoprolol (switched from metoprolol by Dr. Melvyn Novas as bisoprolol is the most beta-1 selective BB in generic in setting of COPD).  Hyperkalemia -Initial K+ 5.3 -->4.2  Anemia -Hgb 8.1.   7.8-9.2 since 01/02/17 when he was hsopitalized for GI bleed in the setting of C. Difficile.  -The plan has been to manage his COPD and improve his breathing before GI testing.  Prolonged QTC -Initial QTC 574 (poor baseline tracing)--> 453 today  HLD -Continue atorvastatin 80 mg  HTN -treated with bisoprolol. BP well controlled  Signed, Daune Perch, NP-C 02/26/2017, 12:36 PM Pager: [phone]  I have seen and examined the patient along with Daune Perch, NP.  I have reviewed the chart, notes and new data.  I agree with NP's note.  Key new complaints: weakness and nausea resolved with oxygen supplementation. Never had angina with these events. He is worried about enoxaparin shots due to his anemia/presumed GI bleeding source. Key examination changes: pale, no edema or JVD, emphysematous chest with severely diminished breath sounds, no S3 Key new findings / data: BNP is nominally higher than baseline. Anemia is severe for a patient with chronic hypoxia ("50-50 club") and usual Hgb of >16. ECG unchanged and troponin normal. No arrhythmia on telemetry. QT interval is now normal (not sure it can be accurately measured on the poor quality initial ECG).  PLAN: I think his symptoms are readily explained by severe hypoxemia and severe anemia. Treatment is primarily with O2 supplementation and correction of anemia. He probably needs  another transfusion. There was minimal evidence of CHF on arrival, none now. I do not think he needs more diuretics. No high risk features for acute coronary insufficiency. Use SCDs instead of enoxaparin for DVT/PE prevention.    Sanda Klein, MD, [place] [phone] 02/26/2017, 4:44 PM

## 2017-02-27 ENCOUNTER — Inpatient Hospital Stay (HOSPITAL_COMMUNITY): Payer: Federal, State, Local not specified - PPO

## 2017-02-27 DIAGNOSIS — I509 Heart failure, unspecified: Secondary | ICD-10-CM

## 2017-02-27 LAB — CBC
HCT: 27.7 % — ABNORMAL LOW (ref 39.0–52.0)
HCT: 28.7 % — ABNORMAL LOW (ref 39.0–52.0)
Hemoglobin: 7.7 g/dL — ABNORMAL LOW (ref 13.0–17.0)
Hemoglobin: 7.8 g/dL — ABNORMAL LOW (ref 13.0–17.0)
MCH: 25.2 pg — AB (ref 26.0–34.0)
MCH: 24.6 pg — ABNORMAL LOW (ref 26.0–34.0)
MCHC: 27.8 g/dL — ABNORMAL LOW (ref 30.0–36.0)
MCHC: 27.2 g/dL — AB (ref 30.0–36.0)
MCV: 90.8 fL (ref 78.0–100.0)
MCV: 90.5 fL (ref 78.0–100.0)
PLATELETS: 217 10*3/uL (ref 150–400)
PLATELETS: 218 10*3/uL (ref 150–400)
RBC: 3.05 MIL/uL — AB (ref 4.22–5.81)
RBC: 3.17 MIL/uL — ABNORMAL LOW (ref 4.22–5.81)
RDW: 20.2 % — ABNORMAL HIGH (ref 11.5–15.5)
RDW: 19.7 % — AB (ref 11.5–15.5)
WBC: 6.1 10*3/uL (ref 4.0–10.5)
WBC: 5.8 10*3/uL (ref 4.0–10.5)

## 2017-02-27 LAB — ECHOCARDIOGRAM COMPLETE
HEIGHTINCHES: 67.5 in
WEIGHTICAEL: 3888 [oz_av]

## 2017-02-27 LAB — IRON AND TIBC
IRON: 82 ug/dL (ref 45–182)
Saturation Ratios: 17 % — ABNORMAL LOW (ref 17.9–39.5)
TIBC: 480 ug/dL — AB (ref 250–450)
UIBC: 398 ug/dL

## 2017-02-27 LAB — BASIC METABOLIC PANEL
Anion gap: 8 (ref 5–15)
BUN: 18 mg/dL (ref 6–20)
CALCIUM: 8.5 mg/dL — AB (ref 8.9–10.3)
CO2: 33 mmol/L — AB (ref 22–32)
CREATININE: 1.07 mg/dL (ref 0.61–1.24)
Chloride: 94 mmol/L — ABNORMAL LOW (ref 101–111)
Glucose, Bld: 154 mg/dL — ABNORMAL HIGH (ref 65–99)
Potassium: 4 mmol/L (ref 3.5–5.1)
SODIUM: 135 mmol/L (ref 135–145)

## 2017-02-27 MED ORDER — PERFLUTREN LIPID MICROSPHERE
INTRAVENOUS | Status: AC
  Administered 2017-02-27: 3 mL via INTRAVENOUS
  Filled 2017-02-27: qty 10

## 2017-02-27 MED ORDER — PERFLUTREN LIPID MICROSPHERE
1.0000 mL | INTRAVENOUS | Status: AC | PRN
  Administered 2017-02-27: 3 mL via INTRAVENOUS
  Filled 2017-02-27: qty 10

## 2017-02-27 NOTE — Consult Note (Signed)
Referring Provider:  [place] Primary Care Physician:  [name], MD Primary Gastroenterologist:  Dr. Benson Norway   Reason for Consultation:  Anemia.   HPI: Jeremiah [name] is a 71 y.o. male with PMH of CAD, COPD, Afib, h/o prostate cancer S/P XRT admitted to hospital with worsening SOB. GI is consulted for further evaluation of anemia and dark stools.. Patient was diagnosed with C diff 0n 01/05/2017. Was seen by Dr. Benson Norway in office 2 weeks ago. Recommended EGD after cardiac and Pulmonary evaluation.  Patient c/o on and off dark stools. Denied black tarry stools. Denied abdominal pain but C/O abdominal bloating. Used to take Aleve which he has stopped since last admission. Denied BRBPR. Continues to have SOB requiring 4 to 5 L of oxygen. C/O ocational nausea without vomiting.   Had colonoscopy 2 years ago in [place] which was normal per patient. Denied family H/O colon cancer.   Past Medical History:  Diagnosis Date  . A-fib ([place])   . CAD (coronary artery disease)    a. 2006: CABG in 2006 with LIMA-LAD, SVG-OM1, and SVG-RPDA  . Chronic pain 03/21/2016  . Essential hypertension 03/21/2016  . History of prostate cancer 03/21/2016  . HLD (hyperlipidemia)   . HTN (hypertension)   . Hx of CABG 2006  . Hypercholesteremia 03/21/2016  . Hypothyroidism   . Medication management 03/21/2016  . Morbid obesity ([place]) 03/21/2016  . Myocardial infarct   . OSA (obstructive sleep apnea) 03/21/2016  . Pain in right ankle and joints of right foot 03/21/2016  . Paronychia 03/21/2016  . Primary insomnia 03/21/2016  . Prostate cancer ([place]) 2008  . Tobacco dependence 03/21/2016    Past Surgical History:  Procedure Laterality Date  . ANKLE SURGERY  12/2013  . CARDIAC CATHETERIZATION N/A 05/08/2016   Procedure: Left Heart Cath and Cors/Grafts Angiography;  Surgeon: Belva Crome, MD;  Location: [facility];  Service: Cardiovascular;  Laterality: N/A;  . CORONARY ARTERY BYPASS GRAFT  2006   x3  . ELECTROPHYSIOLOGIC  STUDY N/A 08/24/2016   Procedure: SVT Ablation;  Surgeon: Will Meredith Leeds, MD;  Location: [facility];  Service: Cardiovascular;  Laterality: N/A;  . PROSTATE SURGERY  2008  . PTCA      Prior to Admission medications   Medication Sig Start Date End Date Taking? Authorizing Provider  aspirin 81 MG tablet Take 1 tablet (81 mg total) by mouth daily. Hold for 5 days Patient taking differently: Take 81 mg by mouth daily.  01/05/17  Yes Ripudeep Krystal Eaton, MD  atorvastatin (LIPITOR) 80 MG tablet Take 80 mg by mouth daily.   Yes Historical Provider, MD  bisoprolol (ZEBETA) 5 MG tablet Take 1 tablet (5 mg total) by mouth daily. 02/12/17  Yes Tanda Rockers, MD  IRON PO Take 1 tablet by mouth daily.   Yes Historical Provider, MD  levothyroxine (SYNTHROID, LEVOTHROID) 75 MCG tablet Take 75 mcg by mouth daily before breakfast.   Yes Historical Provider, MD  pantoprazole (PROTONIX) 40 MG tablet Take 1 tablet (40 mg total) by mouth 2 (two) times daily before a meal. Take twice a day for 1 month then change to once a day in the morning Patient taking differently: Take 40 mg by mouth daily.  01/05/17  Yes Ripudeep Krystal Eaton, MD  Turmeric 500 MG TABS Take 500 mg by mouth daily.   Yes Historical Provider, MD    Scheduled Meds: . aspirin  81 mg Oral Daily  . atorvastatin  80 mg  Oral Daily  . bisoprolol  5 mg Oral Daily  . ferrous sulfate  325 mg Oral Daily  . fluticasone  1 spray Each Nare Daily  . furosemide  40 mg Intravenous BID  . levothyroxine  75 mcg Oral QAC breakfast  . pantoprazole  40 mg Oral Daily  . sodium chloride flush  3 mL Intravenous Q12H   Continuous Infusions: PRN Meds:.sodium chloride, acetaminophen, ipratropium-albuterol, perflutren lipid microspheres (DEFINITY) IV suspension, sodium chloride flush  Allergies as of 02/25/2017 - Review Complete 02/25/2017  Allergen Reaction Noted  . Scallops [shellfish allergy] Other (See Comments) 05/08/2016  . Fluzone [flu virus vaccine] Nausea  And Vomiting, Palpitations, and Rash 08/23/2016    Family History  Problem Relation Age of Onset  . Dementia Mother   . Diabetes Sister     Social History   Social History  . Marital status: Married    Spouse name: N/A  . Number of children: 0  . Years of education: N/A   Occupational History  . retired - Passenger transport manager    Social History Main Topics  . Smoking status: Former Smoker    Packs/day: 0.50    Years: 60.00    Types: Cigarettes    Quit date: 12/15/2016  . Smokeless tobacco: Never Used  . Alcohol use Yes     Comment: very seldom  . Drug use: No  . Sexual activity: Not on file   Other Topics Concern  . Not on file   Social History Narrative   Retired, married, no children      epworth sleepiness scale = 9 (04/13/16) (has OSA)    Review of Systems: All negative except as stated above in HPI.  Physical Exam: Vital signs: Vitals:   02/27/17 0001 02/27/17 0500  BP: (!) 112/50 (!) 136/57  Pulse: 85 87  Resp: 17 17  Temp: 99 F (37.2 C) 98.9 F (37.2 C)   Last BM Date: 02/26/17 Physical Exam  Constitutional: He is oriented to person, place, and time. He appears well-developed. He appears distressed.  HENT:  Head: Normocephalic and atraumatic.  Mouth/Throat: Oropharynx is clear and moist.  Eyes: EOM are normal. Pupils are equal, round, and reactive to light.  Neck: Normal range of motion. Neck supple.  Cardiovascular: Normal rate.   Murmur heard. Pulmonary/Chest: Effort normal. He has wheezes.  Abdominal: Bowel sounds are normal. He exhibits distension. There is no tenderness. There is no rebound and no guarding.  Musculoskeletal: Normal range of motion. He exhibits edema. He exhibits no tenderness.  Neurological: He is alert and oriented to person, place, and time.  Skin: Skin is warm. No rash noted.  Psychiatric: He has a normal mood and affect. His behavior is normal.    GI:  Lab Results:  Recent Labs  02/25/17 1931 02/25/17 1952  02/26/17 0942 02/27/17 0525  WBC 6.6  --  5.0 6.1  HGB 8.1* 9.2* 8.3* 7.7*  HCT 28.8* 27.0* 30.6* 27.7*  PLT 304  --  247 217   BMET  Recent Labs  02/25/17 1931 02/25/17 1952 02/27/17 0525  NA 130* 135 135  K 5.3* 4.2 4.0  CL 92* 94* 94*  CO2 29  --  33*  GLUCOSE 109* 117* 154*  BUN 16 23* 18  CREATININE 1.06 1.10 1.07  CALCIUM 8.3*  --  8.5*   LFT  Recent Labs  02/25/17 1931  PROT 6.3*  ALBUMIN 3.5  AST 91*  ALT 24  ALKPHOS 54  BILITOT 1.2  PT/INR No results for input(s): LABPROT, INR in the last 72 hours.   Studies/Results: Dg Chest 2 View  Result Date: 02/25/2017 CLINICAL DATA:  Dyspnea EXAM: CHEST  2 VIEW COMPARISON:  01/03/2017 FINDINGS: The patient is status post median sternotomy. Cardiac silhouette is borderline enlarged with aortic atherosclerosis. No aneurysm. Mild vascular congestion with small bilateral left greater than right pleural effusions. No acute nor suspicious osseous abnormalities. Degenerative changes are noted along the dorsal spine. IMPRESSION: Status post CABG. Stable cardiomegaly with mild CHF and small left greater than right pleural effusions. Electronically Signed   By: Ashley Royalty M.D.   On: 02/25/2017 20:14    Impression/Plan: - Anemia with dark stools. Occult blood positive in Jan 2018. No overt bleeding  - Recent C diff infection. 01/2017 - resolved. No diarrhea at this time  - Acute Respiratory  failure  - Acute CHF  - CAD   Recommendations  --------------------------- - Patient currently being treated for respiratory failure and possible CHF exacerbation  - No overt bleeding at this time. Mild drop in Hgb noted.  - Recommend conservative management for now with PPI  - Patient has seen Dr. Benson Norway recently in the office. D/W Dr. Collene Mares / Dr. Collene Mares will see patient tomorrow for follow up.  -    LOS: 1 day   Otis Brace  MD, FACP 02/27/2017, 10:50 AM  Pager [phone] If no answer or after 5 PM call [phone]

## 2017-02-27 NOTE — Progress Notes (Addendum)
PROGRESS NOTE  Jeremiah [name]  RKY:[id number] DOB: 1946-04-16 DOA: 02/25/2017 PCP: [name], MD   Chief Complaint  Patient presents with  . Shortness of Breath    Brief Narrative:  71 y.o. male with medical history significant of HTN, CAD s/p CABG, SVT/AVNRT, OSA; who presents with complaints of generalized weakness and nausea over the week or slightly longer. At baseline patient does not require home oxygen. He reports feeling weak ever since being hospitalized 1/31- 2/3 for a GI bleed with enteritis secondary to C. difficile. During the hospitalization he was not scoped as there was concern for COPD. Thereafter patient follow-up with Dr. Melvyn Novas of pulmonology and it appears spirometry readings showed signs for COPD.   Assessment & Plan   Acute CHF exacerbation - Possibly diastolic. Last cath showed EF 50% in June 2017 - ECHO pending  - currently on lasix 40 mg IV BID - cardiology following - weight trend in the past 72 hours  Filed Weights   02/25/17 1854 02/26/17 0146 02/27/17 0500  Weight: 112.5 kg (248 lb) 112.4 kg (247 lb 12.8 oz) 110.2 kg (243 lb)  - appreciate cardiology team following   Acute respiratory failure with hypoxia - Likely secondary to the above and component of acute COPD exacerbation  - O2 saturations noted to be as low as 83% on room air at PCP office - Continue supplemental O2 to maintain sats  >92% - treat above - also ensure BD's to be given, no need for solumedrol at this time  - assess oxygen saturation with ambulation   Prolonged QTC - Initial QTC noted to be 574 - Rechecked 3/27 morning, 453   Hyperkalemia - resolved  - BMP In AM  Hypothyroidism - TSH 2.448 - Continue levothyroxine  Nausea - Avoiding antiemetics 2/2 prolonged QTC  - resolved at this pointm   History of SVT/ NARVT - status post ablation - cardiology team following   Chronic Anemia - baseline hemoglobin around 8, currently 8.3 - continue iron supplementation  -  drop in Hg noted overnight - see trend below  CBC Latest Ref Rng & Units 02/27/2017 02/26/2017 02/25/2017  WBC 4.0 - 10.5 K/uL 6.1 5.0 -  Hemoglobin 13.0 - 17.0 g/dL 7.7(L) 8.3(L) 9.2(L)  Hematocrit 39.0 - 52.0 % 27.7(L) 30.6(L) 27.0(L)  Platelets 150 - 400 K/uL 217 247 -  - GI team consulted for assistance - will hold off on transfusion for now until pt seen by GI team  - will hold off on Lovenox for DVT prophylaxis for now, place on SCD's - check CBC this afternoon and if Hg drops again, plan on transfusion  - CBC in AM  CAD s/p CABG - no complaints of chest pain - Continue aspirin, zebeta, statin  Hyperlipidemia  - Continue atorvastatin  GERD - Continue Protonix  DVT Prophylaxis  Lovenox SQ, stop today as Hg drop noted, place on SCD's  Code Status: Full  Family Communication: None at bedside  Disposition Plan: Home once cardiology and GI team clear   Consultants  Cardiology  GI  Procedures   None  Antibiotics    None  Subjective:   Denies chest pain, cough, abdominal pain, further nausea, vomiting, diarrhea, constipation.   Objective:   Vitals:   02/26/17 1326 02/26/17 1900 02/27/17 0001 02/27/17 0500  BP: (!) 116/92 (!) 122/49 (!) 112/50 (!) 136/57  Pulse: 82 72 85 87  Resp:  18 17 17   Temp: 98.4 F (36.9 C) 99 F (37.2 C) 99 F (37.2  C) 98.9 F (37.2 C)  TempSrc: Oral Oral Oral Oral  SpO2: 92% 95% 94% 90%  Weight:    110.2 kg (243 lb)  Height:        Intake/Output Summary (Last 24 hours) at 02/27/17 0940 Last data filed at 02/27/17 0858  Gross per 24 hour  Intake             1137 ml  Output             1500 ml  Net             -363 ml   Filed Weights   02/25/17 1854 02/26/17 0146 02/27/17 0500  Weight: 112.5 kg (248 lb) 112.4 kg (247 lb 12.8 oz) 110.2 kg (243 lb)    Exam  General: Well developed, well nourished, NAD, appears stated age  HEENT: NCAT, mucous membranes moist.   Neck: Supple, no masses  Cardiovascular: S1 S2  auscultated, no rubs, murmurs or gallops. Regular rate and rhythm.  Respiratory: Course breath sounds with mild exp wheezing noted   Abdomen: Soft, obese, nontender, nondistended, + bowel sounds  Extremities: warm dry without cyanosis clubbing. Trace LE edema.  Neuro: AAOx3, nonfocal  Psych: Normal affect and demeanor with intact judgement and insight  Data Reviewed: I have personally reviewed following labs and imaging studies  CBC:  Recent Labs Lab 02/25/17 1931 02/25/17 1952 02/26/17 0942 02/27/17 0525  WBC 6.6  --  5.0 6.1  NEUTROABS 3.7  --  3.3  --   HGB 8.1* 9.2* 8.3* 7.7*  HCT 28.8* 27.0* 30.6* 27.7*  MCV 88.9  --  90.8 90.8  PLT 304  --  247 626   Basic Metabolic Panel:  Recent Labs Lab 02/25/17 1931 02/25/17 1952 02/27/17 0525  NA 130* 135 135  K 5.3* 4.2 4.0  CL 92* 94* 94*  CO2 29  --  33*  GLUCOSE 109* 117* 154*  BUN 16 23* 18  CREATININE 1.06 1.10 1.07  CALCIUM 8.3*  --  8.5*   Liver Function Tests:  Recent Labs Lab 02/25/17 1931  AST 91*  ALT 24  ALKPHOS 54  BILITOT 1.2  PROT 6.3*  ALBUMIN 3.5   Cardiac Enzymes:  Recent Labs Lab 02/26/17 0115 02/26/17 0942 02/26/17 1540  TROPONINI <0.03 <0.03 <0.03   Thyroid Function Tests:  Recent Labs  02/26/17 0942  TSH 2.448  Urine analysis:    Component Value Date/Time   COLORURINE COLORLESS (A) 02/26/2017 0104   APPEARANCEUR CLEAR 02/26/2017 0104   LABSPEC 1.003 (L) 02/26/2017 0104   PHURINE 6.0 02/26/2017 0104   GLUCOSEU NEGATIVE 02/26/2017 0104   HGBUR NEGATIVE 02/26/2017 0104   BILIRUBINUR NEGATIVE 02/26/2017 0104   KETONESUR NEGATIVE 02/26/2017 0104   PROTEINUR NEGATIVE 02/26/2017 0104   NITRITE NEGATIVE 02/26/2017 0104   LEUKOCYTESUR NEGATIVE 02/26/2017 0104   Radiology Studies: Dg Chest 2 View  Result Date: 02/25/2017 CLINICAL DATA:  Dyspnea EXAM: CHEST  2 VIEW COMPARISON:  01/03/2017 FINDINGS: The patient is status post median sternotomy. Cardiac silhouette is  borderline enlarged with aortic atherosclerosis. No aneurysm. Mild vascular congestion with small bilateral left greater than right pleural effusions. No acute nor suspicious osseous abnormalities. Degenerative changes are noted along the dorsal spine. IMPRESSION: Status post CABG. Stable cardiomegaly with mild CHF and small left greater than right pleural effusions. Electronically Signed   By: Ashley Royalty M.D.   On: 02/25/2017 20:14   Scheduled Meds: . aspirin  81 mg Oral Daily  . atorvastatin  80 mg Oral Daily  . bisoprolol  5 mg Oral Daily  . ferrous sulfate  325 mg Oral Daily  . fluticasone  1 spray Each Nare Daily  . furosemide  40 mg Intravenous BID  . levothyroxine  75 mcg Oral QAC breakfast  . pantoprazole  40 mg Oral Daily  . sodium chloride flush  3 mL Intravenous Q12H   Continuous Infusions:   LOS: 1 day   Time Spent in minutes   30 minutes  MAGICK-MYERS, ISKRA MD. on 02/27/2017 at 9:40 AM  Between 7am to 7pm - Pager - [phone]  After 7pm go to [email or web address] - password [email or web address]  And look for the night coverage person covering for me after hours  [facility] Office  [phone]

## 2017-02-27 NOTE — Progress Notes (Signed)
  Echocardiogram 2D Echocardiogram with definity has been performed.  Darlina Sicilian M 02/27/2017, 10:37 AM

## 2017-02-27 NOTE — Progress Notes (Signed)
Progress Note  Patient Name: Jeremiah [name] Date of Encounter: 02/27/2017  Primary Cardiologist: Dr. Debara Pickett  Subjective   Patient is feeling well; denies chest pain, SOB, and palpitations. Pt states he wants answers and wants to go home.  Inpatient Medications    Scheduled Meds: . aspirin  81 mg Oral Daily  . atorvastatin  80 mg Oral Daily  . bisoprolol  5 mg Oral Daily  . ferrous sulfate  325 mg Oral Daily  . fluticasone  1 spray Each Nare Daily  . furosemide  40 mg Intravenous BID  . levothyroxine  75 mcg Oral QAC breakfast  . pantoprazole  40 mg Oral Daily  . sodium chloride flush  3 mL Intravenous Q12H   Continuous Infusions:  PRN Meds: sodium chloride, acetaminophen, ipratropium-albuterol, sodium chloride flush   Vital Signs    Vitals:   02/26/17 1326 02/26/17 1900 02/27/17 0001 02/27/17 0500  BP: (!) 116/92 (!) 122/49 (!) 112/50 (!) 136/57  Pulse: 82 72 85 87  Resp:  18 17 17   Temp: 98.4 F (36.9 C) 99 F (37.2 C) 99 F (37.2 C) 98.9 F (37.2 C)  TempSrc: Oral Oral Oral Oral  SpO2: 92% 95% 94% 90%  Weight:    243 lb (110.2 kg)  Height:        Intake/Output Summary (Last 24 hours) at 02/27/17 0845 Last data filed at 02/27/17 0558  Gross per 24 hour  Intake             1377 ml  Output             1450 ml  Net              -73 ml   Filed Weights   02/25/17 1854 02/26/17 0146 02/27/17 0500  Weight: 248 lb (112.5 kg) 247 lb 12.8 oz (112.4 kg) 243 lb (110.2 kg)     Physical Exam   General: Well developed, well nourished, male appearing in no acute distress. Head: Normocephalic, atraumatic.  Neck: Supple without bruits, + JVD Lungs:  Resp regular and unlabored, coarse sounds throughout bilaterally with wheezing. Heart: RRR, S1, S2, no S3, S4, or murmur; no rub. Abdomen: Soft, non-tender, non-distended with normoactive bowel sounds. No hepatomegaly. No rebound/guarding. No obvious abdominal masses. Extremities: No clubbing, cyanosis, Trace edema.  Distal pedal pulses are 1+ bilaterally. Neuro: Alert and oriented X 3. Moves all extremities spontaneously. Psych: Normal affect.  Labs    Chemistry Recent Labs Lab 02/25/17 1931 02/25/17 1952 02/27/17 0525  NA 130* 135 135  K 5.3* 4.2 4.0  CL 92* 94* 94*  CO2 29  --  33*  GLUCOSE 109* 117* 154*  BUN 16 23* 18  CREATININE 1.06 1.10 1.07  CALCIUM 8.3*  --  8.5*  PROT 6.3*  --   --   ALBUMIN 3.5  --   --   AST 91*  --   --   ALT 24  --   --   ALKPHOS 54  --   --   BILITOT 1.2  --   --   GFRNONAA >60  --  >60  GFRAA >60  --  >60  ANIONGAP 9  --  8     Hematology Recent Labs Lab 02/25/17 1931 02/25/17 1952 02/26/17 0942 02/27/17 0525  WBC 6.6  --  5.0 6.1  RBC 3.24*  --  3.37* 3.05*  HGB 8.1* 9.2* 8.3* 7.7*  HCT 28.8* 27.0* 30.6* 27.7*  MCV 88.9  --  90.8 90.8  MCH 25.0*  --  24.6* 25.2*  MCHC 28.1*  --  27.1* 27.8*  RDW 20.5*  --  20.0* 20.2*  PLT 304  --  247 217    Cardiac Enzymes Recent Labs Lab 02/26/17 0115 02/26/17 0942 02/26/17 1540  TROPONINI <0.03 <0.03 <0.03    Recent Labs Lab 02/25/17 1950  TROPIPOC 0.00     BNP Recent Labs Lab 02/25/17 1931  BNP 349.2*     DDimer No results for input(s): DDIMER in the last 168 hours.   Radiology    Dg Chest 2 View  Result Date: 02/25/2017 CLINICAL DATA:  Dyspnea EXAM: CHEST  2 VIEW COMPARISON:  01/03/2017 FINDINGS: The patient is status post median sternotomy. Cardiac silhouette is borderline enlarged with aortic atherosclerosis. No aneurysm. Mild vascular congestion with small bilateral left greater than right pleural effusions. No acute nor suspicious osseous abnormalities. Degenerative changes are noted along the dorsal spine. IMPRESSION: Status post CABG. Stable cardiomegaly with mild CHF and small left greater than right pleural effusions. Electronically Signed   By: Ashley Royalty M.D.   On: 02/25/2017 20:14     Telemetry    NSR with PVCs - Personally Reviewed  ECG    EKG: NSR 82 bpm,  LAE, non-specific ST/T changes, similar to 12/2016 - Personally Reviewed   Cardiac Studies     Echocardiogram 02/27/17:  Pending ____________________________________________________________________________________  SVT ablation 08/24/2016 CONCLUSIONS:  1. Sinus rhythm upon presentation.  2. The patient had dual AV nodal physiology with easily inducible classic AV nodal reentrant tachycardia, there were no other accessory pathways or arrhythmias induced  3. Successful radiofrequency modification of the slow AV nodal pathway  4. No inducible arrhythmias following ablation.  5. No early apparent complications.  ___________________________________________________________________________________  Last cardiac cath 05/08/2016 Conclusion   1. LM lesion, 85% stenosed. 2. Ost 1st Mrg lesion, 100% stenosed. 3. SVG was injected . 4. Ost RCA to Prox RCA lesion, 100% stenosed. 5. SVG . 6. Origin to Prox Graft lesion, 40% stenosed. 7. Mid RCA to Dist RCA lesion, 100% stenosed. 8. Prox LAD lesion, 100% stenosed. 9. LIMA .   Patent saphenous vein grafts. SVG to PDA contains eccentric 40% proximal stenosis. SVG to the circumflex is widely patent.  LIMA to the LAD is widely patent.  Native distal left main contains 75% stenosis. The dominant obtuse marginal branch is totally occluded. The native right coronary is totally occluded.  Left ventriculogram demonstrates inferior wall hypokinesis. EF 50%.  Recent chest pain in the setting of PSVT with minimal enzyme elevation likely related to demand ischemia in the setting of underlying native coronary disease.  Recommendations:  Medical therapy.  Management of PSVT with medications versus ablation.     Patient Profile     71 y.o. male with past medical history significant for MI in 2006 with CABG in [place], [place] (LIMA to LAD, SVG to OM1, SVG to RPDA), 2015-cardiac cath at [place] patent X3, 05/2016 cardiac  cath-Patent SVGS and LIMA,  S/P AVNRT ablation 08/24/16. He also has history of afib, chronic pain, prostate cancer, HLD, HTN, morbid obesity BMI 38.24 kg/m, OSA, COPD, former smoker, 30 pack years (quit 12/2016). He presented to the [facility] from his PCP office for evaluation of weakness and nausea over the last week or so.   Assessment & Plan    Acute Heart failure- awaiting echo to define  -EF 50% per cath 05/2016, 2015 echo in [place] showed EF  58% and no significant valvular disease.  -BNP is elevated at 349.2 and CXR shows mild CHF with small bilateral pleural effusions.  -EKG with non-specific ST/T changes, similar to 12/2016 -Troponins negative X3 -BNP 349.2,   TSH 2.448 -K+ 5.3 -->> 4.2   SCr 1.06    -CXR: Status post CABG. Stable cardiomegaly with mild CHF and small left greater than right pleural effusions. -Agree with lasix 40 mg IV bid. Has had 2 doses so far.  -Wt 248->247-->243; Last office weight (09/2016) was 247lbs.  I&O net negative 1.9L   Acute respiratory failure with hypoxia -O2 sat as low as 83% on room air at PCP office.  -Likely multifactorial with history of COPD, anemia, and OSA -Currently treated by IM with nebs and supplemental oxygen- on 4L -Is followed by Dr. Melvyn Novas, pulmonology. Next appt 4/30   CAD -S/P CABG 2006. Last cath 05/2016 showed patent SVG's and LIMA -Treatment with aspirin, high intensity statin, bisoprolol (switched from metoprolol by Dr. Melvyn Novas as bisoprolol is the most beta-1 selective BB in generic in setting of COPD).   Hyperkalemia -Initial K+ 5.3 -->4.2   Anemia -Hgb 7.7  -- 7.8-9.2 since 01/02/17 when he was hsopitalized for GI bleed in the setting of C. Difficile.  -The plan has been to manage his COPD and improve his breathing before GI testing. - may require 1U PRBC given his Hb trending down   Prolonged QTC -Initial QTC 574 (poor baseline tracing)--> 453 on most recent EKG   HLD -Continue atorvastatin 80  mg   HTN -treated with bisoprolol. BP well controlled   Signed, Ledora Bottcher , PA-C 8:45 AM 02/27/2017 Pager: [phone]   I have seen and examined the patient along with Ledora Bottcher , PA .  I have reviewed the chart, notes and new data.  I agree with PA/NP's note.  Key new complaints: No change in dyspnea and fatigue. Key examination changes: pale, mildly tachypneic, very distant breath sounds, improved edema Key new findings / data: Hgb lower at 7.7. Review of echo at bedside shows normal LVEF and normal wall motion, but evidence of diastolic dysfunction and high filling pressures.  PLAN: I think he has a mild diastolic HF exacerbation that is quickly improving with diuretics. There is no evidence of a new ischemic event. The cause for his decompensation is the anemia. A Hgb of 7-8 is severely diminished compared with his baseline of Hgb 16-17, polycythemia due to severe COPD and chronic hypoxemia. I believe that he needs a transfusion, iron supplements, continued diuretics and accelerated workup to identify the cause of his anemia (almost certainly iron deficiency due to chronic GI blood loss).  Other than adjustment of diuretics, I do not think that there will be a need for other Cardiology input.  Sanda Klein, MD, [place] [phone] 02/27/2017, 11:00 AM

## 2017-02-28 LAB — BASIC METABOLIC PANEL
ANION GAP: 11 (ref 5–15)
BUN: 19 mg/dL (ref 6–20)
CO2: 34 mmol/L — AB (ref 22–32)
Calcium: 8.7 mg/dL — ABNORMAL LOW (ref 8.9–10.3)
Chloride: 91 mmol/L — ABNORMAL LOW (ref 101–111)
Creatinine, Ser: 0.97 mg/dL (ref 0.61–1.24)
GLUCOSE: 146 mg/dL — AB (ref 65–99)
POTASSIUM: 3.8 mmol/L (ref 3.5–5.1)
Sodium: 136 mmol/L (ref 135–145)

## 2017-02-28 LAB — CBC
HEMATOCRIT: 28.8 % — AB (ref 39.0–52.0)
HEMOGLOBIN: 8 g/dL — AB (ref 13.0–17.0)
MCH: 25.2 pg — ABNORMAL LOW (ref 26.0–34.0)
MCHC: 27.8 g/dL — AB (ref 30.0–36.0)
MCV: 90.6 fL (ref 78.0–100.0)
Platelets: 194 10*3/uL (ref 150–400)
RBC: 3.18 MIL/uL — AB (ref 4.22–5.81)
RDW: 19.9 % — ABNORMAL HIGH (ref 11.5–15.5)
WBC: 5.2 10*3/uL (ref 4.0–10.5)

## 2017-02-28 LAB — PREPARE RBC (CROSSMATCH)

## 2017-02-28 MED ORDER — SODIUM CHLORIDE 0.9 % IV SOLN: Freq: Once | INTRAVENOUS | Status: DC

## 2017-02-28 NOTE — Progress Notes (Signed)
PROGRESS NOTE  Jeremiah [name]  AJO:[id number] DOB: February 07, 1946 DOA: 02/25/2017 PCP: [name], MD   Chief Complaint  Patient presents with  . Shortness of Breath    Brief Narrative:  71 y.o. male with medical history significant of HTN, CAD s/p CABG, SVT/AVNRT, OSA; who presents with complaints of generalized weakness and nausea over the week or slightly longer. At baseline patient does not require home oxygen. He reports feeling weak ever since being hospitalized 1/31- 2/3 for a GI bleed with enteritis secondary to C. difficile. During the hospitalization he was not scoped as there was concern for COPD. Thereafter patient follow-up with Dr. Melvyn Novas of pulmonology and it appears spirometry readings showed signs for COPD.   Assessment & Plan   Acute CHF exacerbation - Possibly diastolic. Last cath showed EF 50% in June 2017 - ECHO pending  - currently on lasix 40 mg IV BID - cardiology following - weight trend in the past 72 hours  Filed Weights   02/26/17 0146 02/27/17 0500 02/28/17 0419  Weight: 112.4 kg (247 lb 12.8 oz) 110.2 kg (243 lb) 109.9 kg (242 lb 4.6 oz)  - appreciate cardiology team following  - possibly change lasix to PO today   Acute respiratory failure with hypoxia - Likely secondary to the above and component of acute COPD exacerbation  - O2 saturations noted to be as low as 83% on room air at PCP office - Continue supplemental O2 to maintain sats  >92% - treat above - also ensure BD's to be given, no need for solumedrol at this time  - assess oxygen saturation with ambulation   Prolonged QTC - Initial QTC noted to be 574 - Rechecked 3/27 morning, 453 - keep on tele    Hyperkalemia - resolved, WNL this AM  - BMP In AM  Hypothyroidism - TSH 2.448 - Continue levothyroxine  Nausea - Avoiding antiemetics 2/2 prolonged QTC  - resolved at this pointm   History of SVT/ NARVT - status post ablation - cardiology team following   Chronic Anemia - baseline  hemoglobin around 8 - continue iron supplementation  - see Hg trend below  CBC Latest Ref Rng & Units 02/28/2017 02/27/2017 02/27/2017  WBC 4.0 - 10.5 K/uL 5.2 5.8 6.1  Hemoglobin 13.0 - 17.0 g/dL 8.0(L) 7.8(L) 7.7(L)  Hematocrit 39.0 - 52.0 % 28.8(L) 28.7(L) 27.7(L)  Platelets 150 - 400 K/uL 194 218 217  - GI team consulted for assistance - no plan for an intervention at this time - CBC in AM  CAD s/p CABG - no complaints of chest pain - Continue aspirin, zebeta, statin  Hyperlipidemia  - Continue atorvastatin  GERD - Continue Protonix  DVT Prophylaxis  Continue with SCD's   Code Status: Full  Family Communication: None at bedside  Disposition Plan: Home once cardiology and GI team clear   Consultants  Cardiology  GI  Procedures   None  Antibiotics    None  Subjective:   Denies chest pain, cough, abdominal pain, further nausea, vomiting, diarrhea, constipation.   Objective:   Vitals:   02/27/17 2033 02/28/17 0419 02/28/17 0834 02/28/17 1207  BP: (!) 106/48 139/90 120/62 (!) 112/45  Pulse: 78 84 79 76  Resp: 20 20  18   Temp: (!) 100.4 F (38 C) 98 F (36.7 C)  98 F (36.7 C)  TempSrc: Oral Oral  Oral  SpO2: 100% 95%  96%  Weight:  109.9 kg (242 lb 4.6 oz)    Height:  Intake/Output Summary (Last 24 hours) at 02/28/17 1318 Last data filed at 02/28/17 1200  Gross per 24 hour  Intake             1083 ml  Output             1820 ml  Net             -737 ml   Filed Weights   02/26/17 0146 02/27/17 0500 02/28/17 0419  Weight: 112.4 kg (247 lb 12.8 oz) 110.2 kg (243 lb) 109.9 kg (242 lb 4.6 oz)    Exam  General: Well developed, well nourished, NAD, appears stated age  36: NCAT, mucous membranes moist.   Neck: Supple, no masses  Cardiovascular: S1 S2 auscultated, no rubs, murmurs or gallops. Regular rate and rhythm.  Respiratory: Course breath sounds with mild exp wheezing noted   Abdomen: Soft, obese, nontender, nondistended, +  bowel sounds  Extremities: warm dry without cyanosis clubbing. Trace LE edema.  Neuro: AAOx3, nonfocal  Psych: Normal affect and demeanor with intact judgement and insight  Data Reviewed: I have personally reviewed following labs and imaging studies  CBC:  Recent Labs Lab 02/25/17 1931 02/25/17 1952 02/26/17 0942 02/27/17 0525 02/27/17 1353 02/28/17 0514  WBC 6.6  --  5.0 6.1 5.8 5.2  NEUTROABS 3.7  --  3.3  --   --   --   HGB 8.1* 9.2* 8.3* 7.7* 7.8* 8.0*  HCT 28.8* 27.0* 30.6* 27.7* 28.7* 28.8*  MCV 88.9  --  90.8 90.8 90.5 90.6  PLT 304  --  247 217 218 124   Basic Metabolic Panel:  Recent Labs Lab 02/25/17 1931 02/25/17 1952 02/27/17 0525 02/28/17 0514  NA 130* 135 135 136  K 5.3* 4.2 4.0 3.8  CL 92* 94* 94* 91*  CO2 29  --  33* 34*  GLUCOSE 109* 117* 154* 146*  BUN 16 23* 18 19  CREATININE 1.06 1.10 1.07 0.97  CALCIUM 8.3*  --  8.5* 8.7*   Liver Function Tests:  Recent Labs Lab 02/25/17 1931  AST 91*  ALT 24  ALKPHOS 54  BILITOT 1.2  PROT 6.3*  ALBUMIN 3.5   Cardiac Enzymes:  Recent Labs Lab 02/26/17 0115 02/26/17 0942 02/26/17 1540  TROPONINI <0.03 <0.03 <0.03   Thyroid Function Tests:  Recent Labs  02/26/17 0942  TSH 2.448  Urine analysis:    Component Value Date/Time   COLORURINE COLORLESS (A) 02/26/2017 0104   APPEARANCEUR CLEAR 02/26/2017 0104   LABSPEC 1.003 (L) 02/26/2017 0104   PHURINE 6.0 02/26/2017 0104   GLUCOSEU NEGATIVE 02/26/2017 0104   HGBUR NEGATIVE 02/26/2017 0104   BILIRUBINUR NEGATIVE 02/26/2017 0104   KETONESUR NEGATIVE 02/26/2017 0104   PROTEINUR NEGATIVE 02/26/2017 0104   NITRITE NEGATIVE 02/26/2017 0104   LEUKOCYTESUR NEGATIVE 02/26/2017 0104   Radiology Studies: No results found. Scheduled Meds: . aspirin  81 mg Oral Daily  . atorvastatin  80 mg Oral Daily  . bisoprolol  5 mg Oral Daily  . ferrous sulfate  325 mg Oral Daily  . fluticasone  1 spray Each Nare Daily  . furosemide  40 mg Intravenous  BID  . levothyroxine  75 mcg Oral QAC breakfast  . pantoprazole  40 mg Oral Daily  . sodium chloride flush  3 mL Intravenous Q12H   Continuous Infusions:   LOS: 2 days   Time Spent in minutes   30 minutes  Romilda Joy, ISKRA MD. on 02/28/2017 at 1:18 PM  Between 7am  to 7pm - Pager - [phone]  After 7pm go to [email or web address] - password [email or web address]  And look for the night coverage person covering for me after hours  [facility] Office  [phone]

## 2017-02-28 NOTE — Progress Notes (Signed)
Subjective: Feeling better.  He wants to go home.  Objective: Vital signs in last 24 hours: Temp:  [98 F (36.7 C)-100.4 F (38 C)] 98 F (36.7 C) (03/29 1207) Pulse Rate:  [76-84] 76 (03/29 1207) Resp:  [18-20] 18 (03/29 1207) BP: (106-139)/(45-90) 112/45 (03/29 1207) SpO2:  [95 %-100 %] 96 % (03/29 1207) Weight:  [109.9 kg (242 lb 4.6 oz)] 109.9 kg (242 lb 4.6 oz) (03/29 0419) Last BM Date: 02/28/17  Intake/Output from previous day: 03/28 0701 - 03/29 0700 In: 1080 [P.O.:1080] Out: 2120 [Urine:2120] Intake/Output this shift: Total I/O In: 483 [P.O.:480; I.V.:3] Out: 1150 [Urine:1150]  General appearance: alert and no distress Resp: clear to auscultation bilaterally Cardio: regular rate and rhythm GI: soft, non-tender; bowel sounds normal; no masses,  no organomegaly Extremities: extremities normal, atraumatic, no cyanosis or edema  Lab Results:  Recent Labs  02/27/17 0525 02/27/17 1353 02/28/17 0514  WBC 6.1 5.8 5.2  HGB 7.7* 7.8* 8.0*  HCT 27.7* 28.7* 28.8*  PLT 217 218 194   BMET  Recent Labs  02/25/17 1931 02/25/17 1952 02/27/17 0525 02/28/17 0514  NA 130* 135 135 136  K 5.3* 4.2 4.0 3.8  CL 92* 94* 94* 91*  CO2 29  --  33* 34*  GLUCOSE 109* 117* 154* 146*  BUN 16 23* 18 19  CREATININE 1.06 1.10 1.07 0.97  CALCIUM 8.3*  --  8.5* 8.7*   LFT  Recent Labs  02/25/17 1931  PROT 6.3*  ALBUMIN 3.5  AST 91*  ALT 24  ALKPHOS 54  BILITOT 1.2   PT/INR No results for input(s): LABPROT, INR in the last 72 hours. Hepatitis Panel No results for input(s): HEPBSAG, HCVAB, HEPAIGM, HEPBIGM in the last 72 hours. C-Diff No results for input(s): CDIFFTOX in the last 72 hours. Fecal Lactopherrin No results for input(s): FECLLACTOFRN in the last 72 hours.  Studies/Results: No results found.  Medications:  Scheduled: . aspirin  81 mg Oral Daily  . atorvastatin  80 mg Oral Daily  . bisoprolol  5 mg Oral Daily  . ferrous sulfate  325 mg Oral Daily   . fluticasone  1 spray Each Nare Daily  . furosemide  40 mg Intravenous BID  . levothyroxine  75 mcg Oral QAC breakfast  . pantoprazole  40 mg Oral Daily  . sodium chloride flush  3 mL Intravenous Q12H   Continuous:   Assessment/Plan: 1) Anemia. 2) Melena. 3) CHF. 4) COPD.   He is feeling better, but he was on 4 liters of oxygen when I evaluated him.  His COPD is a significant problem and this is the main reason the EGD was not performed.  Anesthesia felt that he was too high risk for sedation and I agreed.  Unfortunately he continues to have an anemia and I did see the stool specimen that was collected by his nurse and it was melenic.  I think a good compromise is to pursue a capsule endoscopy, but this does not allow for any therapeutic maneuver.  I agree with Dr. Sallyanne Kuster that he would most likely benefit with one PRBC.  This can give [place] a buffer.  Overall his HGB has not changed significantly since his discharge from the hospital a few weeks ago.  He is to see Dr. Melvyn Novas in April for his COPD.    Plan: 1) Agree with a unit of PRBC. 2) I will arrange for a capsule endoscopy as an outpatient.  Hopefully this will be completed within the  next week. 3) Signing off.  LOS: 2 days   Arbadella Kimbler D 02/28/2017, 5:23 PM

## 2017-02-28 NOTE — Progress Notes (Signed)
Bath and oral care was offered and pt stated that he did it earlier.

## 2017-02-28 NOTE — Progress Notes (Signed)
Progress Note  Patient Name: Jeremiah [name] Date of Encounter: 02/28/2017  Primary Cardiologist: Dr. Debara Pickett  Subjective  No complaints no chest pain and SOB is stable   Inpatient Medications    Scheduled Meds: . aspirin  81 mg Oral Daily  . atorvastatin  80 mg Oral Daily  . bisoprolol  5 mg Oral Daily  . ferrous sulfate  325 mg Oral Daily  . fluticasone  1 spray Each Nare Daily  . furosemide  40 mg Intravenous BID  . levothyroxine  75 mcg Oral QAC breakfast  . pantoprazole  40 mg Oral Daily  . sodium chloride flush  3 mL Intravenous Q12H   Continuous Infusions:  PRN Meds: sodium chloride, acetaminophen, ipratropium-albuterol, sodium chloride flush   Vital Signs    Vitals:   02/27/17 2033 02/28/17 0419 02/28/17 0834 02/28/17 1207  BP: (!) 106/48 139/90 120/62 (!) 112/45  Pulse: 78 84 79 76  Resp: 20 20  18   Temp: (!) 100.4 F (38 C) 98 F (36.7 C)  98 F (36.7 C)  TempSrc: Oral Oral  Oral  SpO2: 100% 95%  96%  Weight:  242 lb 4.6 oz (109.9 kg)    Height:        Intake/Output Summary (Last 24 hours) at 02/28/17 1549 Last data filed at 02/28/17 1332  Gross per 24 hour  Intake             1083 ml  Output             1820 ml  Net             -737 ml   Filed Weights   02/26/17 0146 02/27/17 0500 02/28/17 0419  Weight: 247 lb 12.8 oz (112.4 kg) 243 lb (110.2 kg) 242 lb 4.6 oz (109.9 kg)    Telemetry    SR NSVT brief episode - Personally Reviewed  ECG     no new EKG - Personally Reviewed  Physical Exam   GEN: No acute distress.  PALE Neck: No JVD Cardiac: RRR, no murmurs, rubs, or gallops.  Respiratory: Clear but diminished  to auscultation bilaterally. GI: Soft, nontender, non-distended  MS: No edema; No deformity. Neuro:  Nonfocal  Psych: Normal affect   Labs    Chemistry Recent Labs Lab 02/25/17 1931 02/25/17 1952 02/27/17 0525 02/28/17 0514  NA 130* 135 135 136  K 5.3* 4.2 4.0 3.8  CL 92* 94* 94* 91*  CO2 29  --  33* 34*  GLUCOSE  109* 117* 154* 146*  BUN 16 23* 18 19  CREATININE 1.06 1.10 1.07 0.97  CALCIUM 8.3*  --  8.5* 8.7*  PROT 6.3*  --   --   --   ALBUMIN 3.5  --   --   --   AST 91*  --   --   --   ALT 24  --   --   --   ALKPHOS 54  --   --   --   BILITOT 1.2  --   --   --   GFRNONAA >60  --  >60 >60  GFRAA >60  --  >60 >60  ANIONGAP 9  --  8 11     Hematology Recent Labs Lab 02/27/17 0525 02/27/17 1353 02/28/17 0514  WBC 6.1 5.8 5.2  RBC 3.05* 3.17* 3.18*  HGB 7.7* 7.8* 8.0*  HCT 27.7* 28.7* 28.8*  MCV 90.8 90.5 90.6  MCH 25.2* 24.6* 25.2*  MCHC 27.8* 27.2* 27.8*  RDW 20.2* 19.7* 19.9*  PLT 217 218 194    Cardiac Enzymes Recent Labs Lab 02/26/17 0115 02/26/17 0942 02/26/17 1540  TROPONINI <0.03 <0.03 <0.03    Recent Labs Lab 02/25/17 1950  TROPIPOC 0.00     BNP Recent Labs Lab 02/25/17 1931  BNP 349.2*     DDimer No results for input(s): DDIMER in the last 168 hours.   Radiology    No results found.  Cardiac Studies   Echo 02/27/17 Study Conclusions  - Procedure narrative: Transthoracic echocardiography. Image   quality was suboptimal. The study was technically difficult, as a   result of body habitus. Intravenous contrast (Definity) was   administered. - Left ventricle: The cavity size was normal. Wall thickness was   increased in a pattern of mild LVH. There was moderate focal   basal hypertrophy of the septum. Systolic function was mildly   reduced. The estimated ejection fraction was in the range of 45%   to 50%. Mid to distal anterior, anteroapical and inferior wall   hypokinesis. No mural thrombus. Doppler parameters are consistent   with pseudonormal left ventricular relaxation (grade 2 diastolic   dysfunction). The E/e&' ratio is >20, suggesting elevated LV   filling pressure. - Aortic valve: Mildly calcified leaflets. There is decreased   mobility of the non-coronary cusp. There is borderline mild   stenosis. Mean gradient (S): 9 mm Hg. Peak  gradient (S): 17 mm   Hg. Valve area (VTI): 2.7 cm^2. Valve area (Vmax): 2.47 cm^2.   Valve area (Vmean): 2.47 cm^2. - Mitral valve: Calcified annulus. There was mild regurgitation.   Valve area by continuity equation (using LVOT flow): 1.89 cm^2. - Left atrium: The atrium was normal in size. - Tricuspid valve: There was mild regurgitation. - Pulmonary arteries: PA peak pressure: 50 mm Hg (S). - Inferior vena cava: The vessel was dilated. The respirophasic   diameter changes were blunted (< 50%), consistent with elevated   central venous pressure.  Impressions:  - Technically difficult study. Definity contrast given. LVEF 45-50%   with regional wall motion abnormalities - mid to distal anterior,   anteroapical and inferior (old) hypokinesis, mild LVH, grade 2 DD   with elevated LV filling pressure, very mild AS with a calcified   NCC which is not mobile, mild MR, mild TR, RVSP 50 mmHg, dilated   IVC, no pericardial effusion.    SVT ablation 08/24/2016 CONCLUSIONS:  1. Sinus rhythm upon presentation.  2. The patient had dual AV nodal physiology with easily inducible classic AV nodal reentrant tachycardia, there were no other accessory pathways or arrhythmias induced  3. Successful radiofrequency modification of the slow AV nodal pathway  4. No inducible arrhythmias following ablation.  5. No early apparent complications.  ___________________________________________________________________________________  Last cardiac cath 05/08/2016 Conclusion   1. LM lesion, 85% stenosed. 2. Ost 1st Mrg lesion, 100% stenosed. 3. SVG was injected . 4. Ost RCA to Prox RCA lesion, 100% stenosed. 5. SVG . 6. Origin to Prox Graft lesion, 40% stenosed. 7. Mid RCA to Dist RCA lesion, 100% stenosed. 8. Prox LAD lesion, 100% stenosed. 9. LIMA .   Patent saphenous vein grafts. SVG to PDA contains eccentric 40% proximal stenosis. SVG to the circumflex is widely patent.  LIMA to the LAD is  widely patent.  Native distal left main contains 75% stenosis. The dominant obtuse marginal branch is totally occluded. The native right coronary is totally occluded.  Left ventriculogram demonstrates inferior wall hypokinesis. EF 50%.  Recent chest pain in the setting of PSVT with minimal enzyme elevation likely related to demand ischemia in the setting of underlying native coronary disease.  Recommendations:  Medical therapy.  Management of PSVT with medications versus ablation.      Patient Profile     71 y.o. male with past medical history significant for MI in 2006 with CABG in [place], [place] to LAD, SVG to OM1, SVG to RPDA), 2015-cardiac cath at [place] patent X3, 05/2016 cardiac cath-Patent SVGS and LIMA, S/P AVNRT ablation 08/24/16. He also has history of afib, chronic pain, prostate cancer, HLD, HTN, morbid obesity BMI38.24 kg/m, OSA, COPD, former smoker, 30 pack years (quit 12/2016). He presented to the [facility] ED from his PCP office for evaluation of weakness and nausea over the last week or so.   Assessment & Plan    Acute Heart failure- diastolic HF in case of acute anemia -EF 50% per cath 05/2016, 2015 echo in [place] showed EF 58% and no significant valvular disease.  -The estimated ejection fraction was in the range of 45%   to 50%. Mid to distal anterior, anteroapical and inferior wall   hypokinesis. +LVH -BNP is elevated at 349.2 and CXR shows mild CHF with small bilateral pleural effusions.  -EKG with non-specific ST/T changes, similar to 12/2016 -Troponins negative X3 -K+ 5.3 -->>4.2 -->>3.8SCr 0.97 -CXR: Status post CABG. Stable cardiomegaly with mild CHF and small left greater than right pleural effusions. -Agree with lasix 40 mg IV bid. Has had 2 doses so far.  -Wt 248->247-->243-->> 242; Last office weight (09/2016) was247lbs. I&O net negative 3.380L   Acute respiratory failure with hypoxia -O2 sat as low as 83%  on room air at PCP office.  Now up to 96% on 4L  -Likely multifactorial with history of COPD, anemia,and OSA -Currently treated by IM with nebs and supplemental oxygen- on 4L -Is followed by Dr. Melvyn Novas, pulmonology. Next appt 4/30   CAD -S/P CABG 2006. Last cath 05/2016 showed patent SVG's and LIMA -Treatment with aspirin, high intensity statin, bisoprolol (switched from metoprolol by Dr. Melvyn Novas as bisoprolol is the most beta-1 selective BB in generic in setting of COPD). -   Hyperkalemia -Initial K+ 5.3 -->4.2--> 3.8   Anemia -Hgb 8.0 --7.8-9.2 since 01/02/17 when he was hsopitalized for GI bleed in the setting of C. Difficile.  -The plan has been to manage his COPD and improve his breathing before GI testing. - may require 1U PRBC see Dr. Jerilynn Mages. Cable Fearn note-- pt's normal hgb has been 16  - needs GI wuGI has seen to monitor   Prolonged QTC -Initial QTC 574 (poor baseline tracing)-->453 on most recent EKG   HLD -Continue atorvastatin 80 mg   HTN -treated with bisoprolol. BP well controlled  NSVT 4 beats  Normal EF, K+ stable     Signed, Cecilie Kicks, NP  02/28/2017, 3:49 PM     I have seen and examined the patient along with Cecilie Kicks, NP .  I have reviewed the chart, notes and new data.  I agree with PA/NP's note.  Reviewed Dr. Ulyses Amor note.   Edema much better. Looks quite pale. Hgb unchanged. Melena today. The problem is indeed that his respiratory status remains very tenuous due to COPD, making EGD riskier.  Will transfuse one unit PRBC to help [place] stave off additional deterioration while he has a noninvasive evaluation for source of bleeding. Volume status markedly improved. Wait to see what happens after transfusion. May  change to PO diuretics tomorrow.   Sanda Klein, MD, [place] [phone] 02/28/2017, 6:36 PM

## 2017-02-28 NOTE — Progress Notes (Signed)
Patient has been comfortable all day, refusing SCDs for most of the day, Agreeable to wearing them again this evening. Pts wife is now at bedside. Pt is awaiting lab draw for type and screen in order to receive his blood transfusion.

## 2017-03-01 ENCOUNTER — Telehealth: Payer: Self-pay | Admitting: Physician Assistant

## 2017-03-01 DIAGNOSIS — J810 Acute pulmonary edema: Secondary | ICD-10-CM

## 2017-03-01 DIAGNOSIS — E7800 Pure hypercholesterolemia, unspecified: Secondary | ICD-10-CM

## 2017-03-01 DIAGNOSIS — I5033 Acute on chronic diastolic (congestive) heart failure: Secondary | ICD-10-CM

## 2017-03-01 DIAGNOSIS — Z951 Presence of aortocoronary bypass graft: Secondary | ICD-10-CM

## 2017-03-01 LAB — TYPE AND SCREEN
ABO/RH(D): O POS
ANTIBODY SCREEN: POSITIVE
DAT, IgG: NEGATIVE
Donor AG Type: NEGATIVE
UNIT DIVISION: 0

## 2017-03-01 LAB — BPAM RBC
Blood Product Expiration Date: 201804222359
ISSUE DATE / TIME: 201803292242
UNIT TYPE AND RH: 5100

## 2017-03-01 LAB — CBC
HCT: 28.9 % — ABNORMAL LOW (ref 39.0–52.0)
Hemoglobin: 8.5 g/dL — ABNORMAL LOW (ref 13.0–17.0)
MCH: 26 pg (ref 26.0–34.0)
MCHC: 29.4 g/dL — ABNORMAL LOW (ref 30.0–36.0)
MCV: 88.4 fL (ref 78.0–100.0)
PLATELETS: 205 10*3/uL (ref 150–400)
RBC: 3.27 MIL/uL — AB (ref 4.22–5.81)
RDW: 19.1 % — ABNORMAL HIGH (ref 11.5–15.5)
WBC: 9.3 10*3/uL (ref 4.0–10.5)

## 2017-03-01 LAB — BASIC METABOLIC PANEL
Anion gap: 12 (ref 5–15)
BUN: 25 mg/dL — ABNORMAL HIGH (ref 6–20)
CHLORIDE: 90 mmol/L — AB (ref 101–111)
CO2: 33 mmol/L — ABNORMAL HIGH (ref 22–32)
CREATININE: 1.03 mg/dL (ref 0.61–1.24)
Calcium: 8.6 mg/dL — ABNORMAL LOW (ref 8.9–10.3)
Glucose, Bld: 194 mg/dL — ABNORMAL HIGH (ref 65–99)
POTASSIUM: 4.6 mmol/L (ref 3.5–5.1)
SODIUM: 135 mmol/L (ref 135–145)

## 2017-03-01 MED ORDER — FUROSEMIDE 40 MG PO TABS
40.0000 mg | ORAL_TABLET | Freq: Two times a day (BID) | ORAL | Status: DC
  Administered 2017-03-01 – 2017-03-02 (×2): 40 mg via ORAL
  Filled 2017-03-01 (×2): qty 1

## 2017-03-01 NOTE — Telephone Encounter (Signed)
New message      TOC appt on 03-11-17 with Almyra Deforest per Mickel Baas

## 2017-03-01 NOTE — Progress Notes (Signed)
Progress Note  Patient Name: Jeremiah [name] Date of Encounter: 03/01/2017  Primary Cardiologist: Dr. Debara Pickett  Subjective   Looks better, color improved.  No chest pain, he slept better with humidity to 02. No acute SOB.  Inpatient Medications    Scheduled Meds: . sodium chloride   Intravenous Once  . sodium chloride   Intravenous Once  . aspirin  81 mg Oral Daily  . atorvastatin  80 mg Oral Daily  . bisoprolol  5 mg Oral Daily  . ferrous sulfate  325 mg Oral Daily  . fluticasone  1 spray Each Nare Daily  . furosemide  40 mg Intravenous BID  . levothyroxine  75 mcg Oral QAC breakfast  . pantoprazole  40 mg Oral Daily  . sodium chloride flush  3 mL Intravenous Q12H   Continuous Infusions:  PRN Meds: sodium chloride, acetaminophen, ipratropium-albuterol, sodium chloride flush   Vital Signs    Vitals:   02/28/17 2238 02/28/17 2313 03/01/17 0221 03/01/17 0624  BP: 118/60 (!) 113/54 (!) 125/50 (!) 120/55  Pulse: 78 80 81 77  Resp: 20 20 14 18   Temp: 99.3 F (37.4 C) 98.2 F (36.8 C) 98.9 F (37.2 C) 97.8 F (36.6 C)  TempSrc: Oral Oral Oral Oral  SpO2: 96% 97% 96% 96%  Weight:    240 lb 8 oz (109.1 kg)  Height:        Intake/Output Summary (Last 24 hours) at 03/01/17 1122 Last data filed at 03/01/17 1012  Gross per 24 hour  Intake             1109 ml  Output             2200 ml  Net            -1091 ml   Filed Weights   02/27/17 0500 02/28/17 0419 03/01/17 0624  Weight: 243 lb (110.2 kg) 242 lb 4.6 oz (109.9 kg) 240 lb 8 oz (109.1 kg)    Telemetry    SR - Personally Reviewed  ECG    No new  - Personally Reviewed  Physical Exam   GEN: No acute distress.   Neck: No JVD Cardiac: RRR, no murmurs, rubs, or gallops.  Respiratory: Clear to auscultation bilaterally. GI: Soft, nontender, non-distended  MS: No edema; No deformity. SCDs in place Neuro:  Nonfocal  Psych: Normal affect   Labs    Chemistry Recent Labs Lab 02/25/17 1931  02/27/17 0525  02/28/17 0514 03/01/17 0440  NA 130*  < > 135 136 135  K 5.3*  < > 4.0 3.8 4.6  CL 92*  < > 94* 91* 90*  CO2 29  --  33* 34* 33*  GLUCOSE 109*  < > 154* 146* 194*  BUN 16  < > 18 19 25*  CREATININE 1.06  < > 1.07 0.97 1.03  CALCIUM 8.3*  --  8.5* 8.7* 8.6*  PROT 6.3*  --   --   --   --   ALBUMIN 3.5  --   --   --   --   AST 91*  --   --   --   --   ALT 24  --   --   --   --   ALKPHOS 54  --   --   --   --   BILITOT 1.2  --   --   --   --   GFRNONAA >60  --  >60 >60 >60  GFRAA >  60  --  >60 >60 >60  ANIONGAP 9  --  8 11 12   < > = values in this interval not displayed.   Hematology Recent Labs Lab 02/27/17 1353 02/28/17 0514 03/01/17 0440  WBC 5.8 5.2 9.3  RBC 3.17* 3.18* 3.27*  HGB 7.8* 8.0* 8.5*  HCT 28.7* 28.8* 28.9*  MCV 90.5 90.6 88.4  MCH 24.6* 25.2* 26.0  MCHC 27.2* 27.8* 29.4*  RDW 19.7* 19.9* 19.1*  PLT 218 194 205    Cardiac Enzymes Recent Labs Lab 02/26/17 0115 02/26/17 0942 02/26/17 1540  TROPONINI <0.03 <0.03 <0.03    Recent Labs Lab 02/25/17 1950  TROPIPOC 0.00     BNP Recent Labs Lab 02/25/17 1931  BNP 349.2*     DDimer No results for input(s): DDIMER in the last 168 hours.   Radiology    No results found.  Cardiac Studies   Echo 02/27/17 Study Conclusions  - Procedure narrative: Transthoracic echocardiography. Image quality was suboptimal. The study was technically difficult, as a result of body habitus. Intravenous contrast (Definity) was administered. - Left ventricle: The cavity size was normal. Wall thickness was increased in a pattern of mild LVH. There was moderate focal basal hypertrophy of the septum. Systolic function was mildly reduced. The estimated ejection fraction was in the range of 45% to 50%. Mid to distal anterior, anteroapical and inferior wall hypokinesis. No mural thrombus. Doppler parameters are consistent with pseudonormal left ventricular relaxation (grade 2  diastolic dysfunction). The E/e&' ratio is >20, suggesting elevated LV filling pressure. - Aortic valve: Mildly calcified leaflets. There is decreased mobility of the non-coronary cusp. There is borderline mild stenosis. Mean gradient (S): 9 mm Hg. Peak gradient (S): 17 mm Hg. Valve area (VTI): 2.7 cm^2. Valve area (Vmax): 2.47 cm^2. Valve area (Vmean): 2.47 cm^2. - Mitral valve: Calcified annulus. There was mild regurgitation. Valve area by continuity equation (using LVOT flow): 1.89 cm^2. - Left atrium: The atrium was normal in size. - Tricuspid valve: There was mild regurgitation. - Pulmonary arteries: PA peak pressure: 50 mm Hg (S). - Inferior vena cava: The vessel was dilated. The respirophasic diameter changes were blunted (<50%), consistent with elevated central venous pressure.  Impressions:  - Technically difficult study. Definity contrast given. LVEF 45-50% with regional wall motion abnormalities - mid to distal anterior, anteroapical and inferior (old) hypokinesis, mild LVH, grade 2 DD with elevated LV filling pressure, very mild AS with a calcified NCC which is not mobile, mild MR, mild TR, RVSP 50 mmHg, dilated IVC, no pericardial effusion.    Patient Profile     71 y.o. male with past medical history significant for MI in 2006 with CABG in [place], [place] to LAD, SVG to OM1, SVG to RPDA), 2015-cardiac cath at [place] patent X3, 05/2016 cardiac cath-Patent SVGS and LIMA, S/P AVNRT ablation 08/24/16. He also has history of afib, chronic pain, prostate cancer, HLD, HTN, morbid obesity BMI38.24 kg/m, OSA, COPD, former smoker, 30 pack years (quit 12/2016). He presented to the [facility] ED from his PCP office for evaluation of weakness and nausea over the last week or so.   Assessment & Plan    Acute Heart failure- diastolic HF in case of acute anemia -EF 50% per cath 05/2016, 2015 echo in [place] showed EF 58%  and no significant valvular disease.  -The estimated ejection fraction was in the range of 45% to 50%. Mid to distal anterior, anteroapical and inferior wall hypokinesis. +LVH -BNP was  elevated at 349.2 and CXR shows mild CHF with small bilateral pleural effusions.  -Troponins negative X3 -K+ 5.3 -->>4.2 -->>3.8SCr 1.03 stable -CXR: Status post CABG. Stable cardiomegaly with mild CHF and small left greater than right pleural effusions. -Agree with lasix 40 mg IV bid. Has had 7 doses so far. See below -Wt 248->247-->243-->> 242--> 240 ; Last office weight (09/2016) was247lbs. I&O net negative 4,212 L and urine output last night 1L  -change to Lasix po 40 BID today and possible discharge tomorrow.    Acute respiratory failure with hypoxia -O2 sat as low as 83% on room air at PCP office.  Now up to 96% on 4L  -Likely multifactorial with history of COPD, anemia,and OSA -Currently treated by IM with nebs and supplemental oxygen- on 4L -Is followed by Dr. Melvyn Novas, pulmonology. Next appt 4/30   CAD -S/P CABG 2006. Last cath 05/2016 showed patent SVG's and LIMA -Treatment with aspirin, high intensity statin, bisoprolol (switched from metoprolol by Dr. Melvyn Novas as bisoprolol is the most beta-1 selective BB in generic in setting of COPD).    Hyperkalemia -Initial K+ 5.3 -->4.2--> 3.8-->4,6   Anemia -Hgb 8.0 --7.8-9.2 since 01/02/17 when he was hsopitalized for GI bleed in the setting of C. Difficile.  -The plan has been to manage his COPD and improve his breathing before GI testing. -rec'd 1U PRBC -- pt's normal hgb has been 16 -today after 1 unit he is at 8.5  - GI has seen and his COPD has caused delay in EGD, he will now have outpt capsule endoscopy.    Prolonged QTC -Initial QTC 574 (poor baseline tracing)-->453 on most recent EKG   HLD -Continue atorvastatin 80 mg   HTN -treated with bisoprolol. BP well controlled  NSVT 4 beats  Normal EF, K+ stable     Signed, Cecilie Kicks, NP  03/01/2017, 11:22 AM     I have seen and examined the patient along with Cecilie Kicks, NP.  I have reviewed the chart, notes and new data.  I agree with NP's note.  Key new complaints: feels better after transfusion Key examination changes: edema has resolved. Key new findings / data: Hgb 8.5  PLAN: Appears euvolemic. 140 lb here today (8 lb less than admission). DC on diuretics, furosemide 40 mg PO daily, sodium restricted diet, increase K rich foods. Discussed daily weights in detail. Weigh as soon as he gets home and use that weight as baseline. Weigh daily, call us if he gains 3 lb overnight or 5 lb over DC weight. Very high risk of repeat CHF exacerbation until his severe anemia is corrected. His biggest liability remains severe COPD with chronic hypoxia/hypercapnia respiratory failure. We have made arrangements for Cardiology clinic follow up April 9.   Sanda Klein, MD, [place] [phone] 03/01/2017, 3:33 PM

## 2017-03-01 NOTE — Progress Notes (Signed)
PROGRESS NOTE  Gillian Kluever  XAJ:[id number] DOB: 13-Sep-1946 DOA: 02/25/2017 PCP: [name], MD   Chief Complaint  Patient presents with  . Shortness of Breath    Brief Narrative:  71 y.o. male with medical history significant of HTN, CAD s/p CABG, SVT/AVNRT, OSA; who presents with complaints of generalized weakness and nausea over the week or slightly longer. At baseline patient does not require home oxygen. He reports feeling weak ever since being hospitalized 1/31- 2/3 for a GI bleed with enteritis secondary to C. difficile. During the hospitalization he was not scoped as there was concern for COPD. Thereafter patient follow-up with Dr. Melvyn Novas of pulmonology and it appears spirometry readings showed signs for COPD.   Assessment & Plan   Acute CHF exacerbation - Possibly diastolic. Last cath showed EF 50% in June 2017 - ECHO pending  - currently on lasix 40 mg IV BID, per cardiology change to PO today and possible d/c home in AM  - cardiology following - weight trend in the past 72 hours  Filed Weights   02/27/17 0500 02/28/17 0419 03/01/17 0624  Weight: 110.2 kg (243 lb) 109.9 kg (242 lb 4.6 oz) 109.1 kg (240 lb 8 oz)  - appreciate cardiology team following   Acute respiratory failure with hypoxia - Likely secondary to the above and component of acute COPD exacerbation  - O2 saturations noted to be as low as 83% on room air at PCP office - Continue supplemental O2 to maintain sats  >92% - treat above - also ensure BD's to be given, no need for solumedrol at this time  - assess oxygen saturation with ambulation   Prolonged QTC - Initial QTC noted to be 574 - Rechecked 3/27 morning, 453 - keep on tele    Hyperkalemia - resolved, WNL this AM  - BMP In AM  Hypothyroidism - TSH 2.448 - Continue levothyroxine  Nausea - Avoiding antiemetics 2/2 prolonged QTC  - resolved at this pointm   History of SVT/ NARVT - status post ablation - cardiology team following    Chronic Anemia - baseline hemoglobin around 8 - continue iron supplementation  - see Hg trend below  CBC Latest Ref Rng & Units 03/01/2017 02/28/2017 02/27/2017  WBC 4.0 - 10.5 K/uL 9.3 5.2 5.8  Hemoglobin 13.0 - 17.0 g/dL 8.5(L) 8.0(L) 7.8(L)  Hematocrit 39.0 - 52.0 % 28.9(L) 28.8(L) 28.7(L)  Platelets 150 - 400 K/uL 205 194 218  - GI team consulted for assistance - no plan for an intervention at this time, outpt follow up recommended  - CBC in AM  CAD s/p CABG - no complaints of chest pain - Continue aspirin, zebeta, statin  Hyperlipidemia  - Continue atorvastatin  GERD - Continue Protonix  DVT Prophylaxis  Continue with SCD's   Code Status: Full  Family Communication: None at bedside  Disposition Plan: Home possibly in AM  Consultants  Cardiology  GI  Procedures   None  Antibiotics    None  Subjective:   Denies chest pain, cough, abdominal pain, further nausea, vomiting, diarrhea, constipation.   Objective:   Vitals:   02/28/17 2313 03/01/17 0221 03/01/17 0624 03/01/17 1224  BP: (!) 113/54 (!) 125/50 (!) 120/55 (!) 101/39  Pulse: 80 81 77 64  Resp: 20 14 18 18   Temp: 98.2 F (36.8 C) 98.9 F (37.2 C) 97.8 F (36.6 C) 97.9 F (36.6 C)  TempSrc: Oral Oral Oral Oral  SpO2: 97% 96% 96% 98%  Weight:   109.1 kg (240  lb 8 oz)   Height:        Intake/Output Summary (Last 24 hours) at 03/01/17 1236 Last data filed at 03/01/17 1129  Gross per 24 hour  Intake             1109 ml  Output             1701 ml  Net             -592 ml   Filed Weights   02/27/17 0500 02/28/17 0419 03/01/17 0624  Weight: 110.2 kg (243 lb) 109.9 kg (242 lb 4.6 oz) 109.1 kg (240 lb 8 oz)    Exam  General: Well developed, well nourished, NAD, appears stated age  HEENT: NCAT, mucous membranes moist.   Neck: Supple, no masses  Cardiovascular: S1 S2 auscultated, no rubs, murmurs or gallops. Regular rate and rhythm.  Respiratory: Course breath sounds with mild  exp wheezing noted   Abdomen: Soft, obese, nontender, nondistended, + bowel sounds  Extremities: warm dry without cyanosis clubbing. Trace LE edema.  Neuro: AAOx3, nonfocal  Psych: Normal affect and demeanor with intact judgement and insight  Data Reviewed: I have personally reviewed following labs and imaging studies  CBC:  Recent Labs Lab 02/25/17 1931  02/26/17 0942 02/27/17 0525 02/27/17 1353 02/28/17 0514 03/01/17 0440  WBC 6.6  --  5.0 6.1 5.8 5.2 9.3  NEUTROABS 3.7  --  3.3  --   --   --   --   HGB 8.1*  < > 8.3* 7.7* 7.8* 8.0* 8.5*  HCT 28.8*  < > 30.6* 27.7* 28.7* 28.8* 28.9*  MCV 88.9  --  90.8 90.8 90.5 90.6 88.4  PLT 304  --  247 217 218 194 205  < > = values in this interval not displayed. Basic Metabolic Panel:  Recent Labs Lab 02/25/17 1931 02/25/17 1952 02/27/17 0525 02/28/17 0514 03/01/17 0440  NA 130* 135 135 136 135  K 5.3* 4.2 4.0 3.8 4.6  CL 92* 94* 94* 91* 90*  CO2 29  --  33* 34* 33*  GLUCOSE 109* 117* 154* 146* 194*  BUN 16 23* 18 19 25*  CREATININE 1.06 1.10 1.07 0.97 1.03  CALCIUM 8.3*  --  8.5* 8.7* 8.6*   Liver Function Tests:  Recent Labs Lab 02/25/17 1931  AST 91*  ALT 24  ALKPHOS 54  BILITOT 1.2  PROT 6.3*  ALBUMIN 3.5   Cardiac Enzymes:  Recent Labs Lab 02/26/17 0115 02/26/17 0942 02/26/17 1540  TROPONINI <0.03 <0.03 <0.03   Thyroid Function Tests: No results for input(s): TSH, T4TOTAL, FREET4, T3FREE, THYROIDAB in the last 72 hours.Urine analysis:    Component Value Date/Time   COLORURINE COLORLESS (A) 02/26/2017 0104   APPEARANCEUR CLEAR 02/26/2017 0104   LABSPEC 1.003 (L) 02/26/2017 0104   PHURINE 6.0 02/26/2017 0104   GLUCOSEU NEGATIVE 02/26/2017 0104   HGBUR NEGATIVE 02/26/2017 0104   BILIRUBINUR NEGATIVE 02/26/2017 0104   KETONESUR NEGATIVE 02/26/2017 0104   PROTEINUR NEGATIVE 02/26/2017 0104   NITRITE NEGATIVE 02/26/2017 0104   LEUKOCYTESUR NEGATIVE 02/26/2017 0104   Radiology Studies: No  results found. Scheduled Meds: . sodium chloride   Intravenous Once  . sodium chloride   Intravenous Once  . aspirin  81 mg Oral Daily  . atorvastatin  80 mg Oral Daily  . bisoprolol  5 mg Oral Daily  . ferrous sulfate  325 mg Oral Daily  . fluticasone  1 spray Each Nare Daily  . furosemide  40 mg Intravenous BID  . levothyroxine  75 mcg Oral QAC breakfast  . pantoprazole  40 mg Oral Daily  . sodium chloride flush  3 mL Intravenous Q12H   Continuous Infusions:   LOS: 3 days   Time Spent in minutes   30 minutes  MAGICK-Neelie Welshans MD. on 03/01/2017 at 12:36 PM  Between 7am to 7pm - Pager - [phone]  After 7pm go to [email or web address] - password [email or web address]  And look for the night coverage person covering for me after hours  [facility] Office  [phone]

## 2017-03-01 NOTE — Progress Notes (Signed)
Heart Failure Navigator Consult Note  Presentation: per Dr. Adalberto Cole [name] is a 71 y.o. with past medical history significant for MI in 2006 with CABG in [place], [place] (LIMA to LAD, SVG to OM1, SVG to RPDA), 2015-cardiac cath at [facility] patent X3, 05/2016 cardiac cath-Patent SVGS and LIMA,  S/P AVNRT ablation 08/24/16. He also has history of afib, chronic pain, prostate cancer, HLD, HTN, morbid obesity BMI 38.24 kg/m, OSA, COPD, former smoker, 30 pack years (quit 12/2016). He presented to [name] from his PCP office for evaluation of weakness and nausea over the last week or so.   He has been feeling weak, lightheaded and nauseated and thought that this may be a relapse of C. Dif that he had 3 weeks ago. He is not having any further loose stools. He denies any orthopnea, PND or palpitations. He has mild lower extremity edema. Has has not been using his CPAP as he hates it. He has COPD and is scheduled for testing with Dr. Melvyn Novas, pulmonology, next month. After that and when his breathing is better he is supposed to have GI workup for anemia. Pt was taking 3-4 doses of Alka seltzer per day. He has continued   Pt has been anemic and hospitalized 1/31-2/3 for GI bleed with enteritis secondary to C. Difficile. During the hospitalization he was not scoped as there was concern for COPD. Currently his Hgb is 8.1.  Past Medical History:  Diagnosis Date  . A-fib ([name])   . CAD (coronary artery disease)    a. 2006: CABG in 2006 with LIMA-LAD, SVG-OM1, and SVG-RPDA  . Chronic pain 03/21/2016  . Essential hypertension 03/21/2016  . History of prostate cancer 03/21/2016  . HLD (hyperlipidemia)   . HTN (hypertension)   . Hx of CABG 2006  . Hypercholesteremia 03/21/2016  . Hypothyroidism   . Medication management 03/21/2016  . Morbid obesity ([place]) 03/21/2016  . Myocardial infarct   . OSA (obstructive sleep apnea) 03/21/2016  . Pain in right ankle and joints of right foot  03/21/2016  . Paronychia 03/21/2016  . Primary insomnia 03/21/2016  . Prostate cancer ([place]) 2008  . Tobacco dependence 03/21/2016    Social History   Social History  . Marital status: Married    Spouse name: N/A  . Number of children: 0  . Years of education: N/A   Occupational History  . retired - Passenger transport manager    Social History Main Topics  . Smoking status: Former Smoker    Packs/day: 0.50    Years: 60.00    Types: Cigarettes    Quit date: 12/15/2016  . Smokeless tobacco: Never Used  . Alcohol use Yes     Comment: very seldom  . Drug use: No  . Sexual activity: Not Asked   Other Topics Concern  . None   Social History Narrative   Retired, married, no children      epworth sleepiness scale = 9 (04/13/16) (has OSA)    ECHO:Study Conclusions-02/27/17  - Procedure narrative: Transthoracic echocardiography. Image   quality was suboptimal. The study was technically difficult, as a   result of body habitus. Intravenous contrast (Definity) was   administered. - Left ventricle: The cavity size was normal. Wall thickness was   increased in a pattern of mild LVH. There was moderate focal   basal hypertrophy of the septum. Systolic function was mildly   reduced. The estimated ejection fraction was in the range of 45%   to  50%. Mid to distal anterior, anteroapical and inferior wall   hypokinesis. No mural thrombus. Doppler parameters are consistent   with pseudonormal left ventricular relaxation (grade 2 diastolic   dysfunction). The E/e&' ratio is >20, suggesting elevated LV   filling pressure. - Aortic valve: Mildly calcified leaflets. There is decreased   mobility of the non-coronary cusp. There is borderline mild   stenosis. Mean gradient (S): 9 mm Hg. Peak gradient (S): 17 mm   Hg. Valve area (VTI): 2.7 cm^2. Valve area (Vmax): 2.47 cm^2.   Valve area (Vmean): 2.47 cm^2. - Mitral valve: Calcified annulus. There was mild regurgitation.   Valve area by continuity equation  (using LVOT flow): 1.89 cm^2. - Left atrium: The atrium was normal in size. - Tricuspid valve: There was mild regurgitation. - Pulmonary arteries: PA peak pressure: 50 mm Hg (S). - Inferior vena cava: The vessel was dilated. The respirophasic   diameter changes were blunted (< 50%), consistent with elevated   central venous pressure.  Impressions:  - Technically difficult study. Definity contrast given. LVEF 45-50%   with regional wall motion abnormalities - mid to distal anterior,   anteroapical and inferior (old) hypokinesis, mild LVH, grade 2 DD   with elevated LV filling pressure, very mild AS with a calcified   NCC which is not mobile, mild MR, mild TR, RVSP 50 mmHg, dilated   IVC, no pericardial effusion. BNP    Component Value Date/Time   BNP 349.2 (H) 02/25/2017 1931    ProBNP No results found for: PROBNP   Education Assessment and Provision:  Detailed education and instructions provided on heart failure disease management including the following:  Signs and symptoms of Heart Failure When to call the physician Importance of daily weights Low sodium diet Fluid restriction Medication management Anticipated future follow-up appointments  Patient education given on each of the above topics.  Patient acknowledges understanding and acceptance of all instructions.   I spoke with [name] and his wife.  He actually dozed during most of the conversation and therefore I spoke primarily with her.  She tells me that they have not been told that he has HF.  I spent some time discussing the diagnosis and recommendations for home.  They have a scale and he does not currently weight.  I reviewed the importance of daily weights and when to contact the physician.  I discussed a low sodium diet and high sodium foods to avoid.  She tells me that he uses "a lot" of "[employer] Seasoning Salt" and also adds white table salt to foods.  I strongly discouraged the use of these two items.  She  denies any issues with getting or taking prescribed medications.  He will [facility] after discharge.    Education Materials:  "Living Better With Heart Failure" Booklet, Daily Weight Tracker Tool    High Risk Criteria for Readmission and/or Poor Patient Outcomes:   EF <30%- 45-50%  2 or more admissions in 6 months- 2/6 mo  Difficult social situation- No  Demonstrates medication noncompliance- denies   Barriers of Care:  New Diagnosis of HF, Knowledge and compliance  Discharge Planning:   Plans to return to home with his wife in [place].

## 2017-03-02 LAB — CBC
HEMATOCRIT: 29.5 % — AB (ref 39.0–52.0)
Hemoglobin: 8.2 g/dL — ABNORMAL LOW (ref 13.0–17.0)
MCH: 24.8 pg — AB (ref 26.0–34.0)
MCHC: 27.8 g/dL — AB (ref 30.0–36.0)
MCV: 89.4 fL (ref 78.0–100.0)
Platelets: 171 10*3/uL (ref 150–400)
RBC: 3.3 MIL/uL — ABNORMAL LOW (ref 4.22–5.81)
RDW: 18.9 % — AB (ref 11.5–15.5)
WBC: 5.3 10*3/uL (ref 4.0–10.5)

## 2017-03-02 LAB — BASIC METABOLIC PANEL
Anion gap: 8 (ref 5–15)
BUN: 21 mg/dL — AB (ref 6–20)
CHLORIDE: 90 mmol/L — AB (ref 101–111)
CO2: 37 mmol/L — ABNORMAL HIGH (ref 22–32)
Calcium: 8.5 mg/dL — ABNORMAL LOW (ref 8.9–10.3)
Creatinine, Ser: 0.87 mg/dL (ref 0.61–1.24)
GFR calc Af Amer: >60 mL/min (ref >60)
GFR calc non Af Amer: >60 mL/min (ref >60)
Glucose, Bld: 165 mg/dL — ABNORMAL HIGH (ref 65–99)
Potassium: 3.6 mmol/L (ref 3.5–5.1)
Sodium: 135 mmol/L (ref 135–145)

## 2017-03-02 MED ORDER — IPRATROPIUM-ALBUTEROL 0.5-2.5 (3) MG/3ML IN SOLN: 3.0000 mL | RESPIRATORY_TRACT | 0 refills | Status: DC | PRN

## 2017-03-02 MED ORDER — FUROSEMIDE 40 MG PO TABS: 40.0000 mg | ORAL_TABLET | Freq: Two times a day (BID) | ORAL | 0 refills | Status: DC

## 2017-03-02 MED ORDER — PANTOPRAZOLE SODIUM 40 MG PO TBEC: 40.0000 mg | DELAYED_RELEASE_TABLET | Freq: Every day | ORAL | 0 refills | Status: DC

## 2017-03-02 NOTE — Progress Notes (Signed)
Pt has orders to be discharged. Discharge instructions given and pt has no additional questions at this time. Medication regimen reviewed and pt educated. Pt verbalized understanding and has no additional questions. Telemetry box removed. IV removed and site in good condition. Pt stable and waiting for transportation and oxygen to be delivered.  Maurene Capes RN

## 2017-03-02 NOTE — Progress Notes (Signed)
SATURATION QUALIFICATIONS: (This note is used to comply with regulatory documentation for home oxygen)  Patient Saturations on Room Air at Rest = 82%  Patient Saturations on Room Air while Ambulating = 82%  Patient Saturations on 2 Liters of oxygen while Ambulating = 93%

## 2017-03-02 NOTE — Discharge Instructions (Signed)
Pulmonary Edema °Pulmonary edema (PE) is a condition in which fluid collects in the lungs. This makes it hard to breathe. PE may be a result of the heart not pumping very well or a result of injury. °What are the causes? °· Coronary artery disease causes blockages in the arteries of the heart. This deprives the heart muscle of oxygen and weakens the muscle. A heart attack is a form of coronary artery disease. °· High blood pressure causes the heart muscle to work harder than usual. Over time, the heart muscle may get stiff, and it starts to work less efficiently. It may also fatigue and weaken. °· Viral infection of the heart (myocarditis) may weaken the heart muscle. °· Metabolic conditions such as thyroid disease, excessive alcohol use, certain vitamin deficiencies, or diabetes may also weaken the heart muscle. °· Leaky or stiff heart valves may impair normal heart function. °· Lung disease may strain the heart muscle. °· Excessive demands on the heart such as too much salt or fluid intake. °· Failure to take prescribed medicines. °· Lung injury from heat or toxins, such as poisonous gas. °· Infection in the lungs or other parts of the body. °· Fluid overload caused by kidney failure or medicines. °What are the signs or symptoms? °· Shortness of breath at rest or with exertion. °· Grunting, wheezing, or gurgling while breathing. °· Feeling like you cannot get enough air. °· Breaths are shallow and fast. °· A lot of coughing with frothy or bloody mucus. °· Skin may become cool, damp, and turn a pale or bluish color. °How is this diagnosed? °Initial diagnosis may be based on your history, symptoms, and a physical examination. Additional tests for PE may include: °· Electrocardiography. °· Chest X-ray. °· Blood tests. °· Stress test. °· Ultrasound evaluation of the heart (echocardiography). °· Evaluation by a heart doctor (cardiologist). °· Test of the heart arteries to look for blockages (angiography). °· Check of  blood oxygen. ° °How is this treated? °Treatment of PE will depend on the underlying cause and will focus first on relieving the symptoms. °· Extra oxygen to make breathing easier and assist with removing mucus. This may include breathing treatments or a tube into the lungs and a breathing machine. °· Medicine to help the body get rid of extra water, usually through an IV tube. °· Medicine to help the heart pump better. °· If poor heart function is the cause, treatment may include: °? Procedures to open blocked arteries, repair damaged heart valves, or remove some of the damaged heart muscle. °? A pacemaker to help the heart pump with less effort. ° °Follow these instructions at home: °· Your health care provider will help you determine what type of exercise program may be helpful. It is important to maintain strength and increase it if possible. Pace your activities to avoid shortness of breath or chest pain. Rest for at least 1 hour before and after meals. Cardiac rehabilitation programs are available in some locations. °· Eat a heart-healthy diet low in salt, saturated fat, and cholesterol. Ask for help with choices. °· Make a list of every medicine, vitamin, or herbal supplement you are taking. Keep the list with you at all times. Show it to your health care provider at every visit and before starting a new medicine. Keep the list up to date. °· Ask your health care provider or pharmacist to help you write a plan or schedule so that you know things about each medicine such as: °?   Why you are taking it. °? The possible side effects. °? The best time of day to take it. °? Foods to take with it or avoid. °? When to stop taking it. °· Record your hospital or clinic weight. When you get home, compare it to your scale and record your weight. Then, weigh yourself first thing in the morning daily, and record the weights. You should weigh yourself every morning after you urinate and before you eat breakfast. Wear the  same amount of clothing each time you weigh yourself. Provide your health care provider with your weight record. Daily weights are important in the early recognition of excess fluid. Tell your health care provider right away if you have gained 3 lb (1.4 kg) in 1 day, 5 lb (2.3 kg) in a week, or as directed by your health care provider. Your medicines may need to be adjusted. °· Blood pressure monitoring should be done as often as directed. You can get a home blood pressure cuff at your drugstore. Record these values and bring them with you for your clinic visits. Notify your health care provider if you become dizzy or light-headed when standing up. °· If you are currently a smoker, it is time to quit. Nicotine makes your heart work harder and is one of the leading causes of cardiac deaths. Do not use nicotine gum or patches before talking to your doctor. °· Make a follow-up appointment with your health care provider as directed. °· Ask your health care provider for a copy of your latest heart tracing (ECG) and keep a copy with you at all times. °Get help right away if: °· You have severe chest pain, especially if the pain is crushing or pressure-like and spreads to the arms, back, neck, or jaw. THIS IS AN EMERGENCY. Do not wait to see if the pain will go away. Call for local emergency medical help. Do not drive yourself to the hospital. °· You have sweating, feel sick to your stomach (nauseous), or are experiencing shortness of breath. °· Your weight increases by 3 lb (1.4 kg) in 1 day or 5 lb (2.3 kg) in a week. °· You notice increasing shortness of breath that is unusual for you. This may happen during rest, sleep, or with activity. °· You develop chest pain (angina) or pain that is unusual for you. °· You notice more swelling in your hands, feet, ankles, or abdomen. °· You notice lasting (persistent) dizziness, blurred vision, headache, or unsteadiness. °· You begin to cough up bloody mucus (sputum). °· You are  unable to sleep because it is hard to breathe. °· You begin to feel a “jumping” or “fluttering” sensation (palpitations) in the chest that is unusual for you. °This information is not intended to replace advice given to you by your health care provider. Make sure you discuss any questions you have with your health care provider. °Document Released: 02/09/2003 Document Revised: 06/08/2016 Document Reviewed: 07/27/2013 °[employer]vier Interactive Patient Education © 2017 [employer] ° °

## 2017-03-02 NOTE — Discharge Summary (Addendum)
Physician Discharge Summary  Jeremiah [name] ACZ:[id number] DOB: [birth year] DOA: 02/25/2017  PCP: [name], MD  Admit date: 02/25/2017 Discharge date: 03/02/2017  Recommendations for Outpatient Follow-up:  1. Pt will need to follow up with PCP in 1-2 weeks post discharge 2. Please obtain BMP to evaluate electrolytes and kidney function 3. Please also check CBC to evaluate Hg and Hct levels 4. Pt advised to follow up with his cardiologist as outlined below 5. Pt started on Lasix 40 mg PO BID and advised to monitor his weight closely  6. Weight on discharge 239 lbs  7. Please consider checking A1C in an outpatient setting 8. Please note that pt was discharge on oxygen via [place] 2 L 9. Please note that Dr. Benson Norway will arrange capsule endoscopy   Discharge Diagnoses:  Principal Problem:   Acute exacerbation of congestive heart failure ([place]) Active Problems:   S/P CABG x 3   Hypercholesteremia   Hypothyroidism, acquired  Discharge Condition: Stable  Diet recommendation: Heart healthy diet discussed in details   Brief Narrative:  71 y.o.malewith medical history significant of HTN, CAD s/p CABG, SVT/AVNRT, OSA; who presents with complaints of generalized weakness and nausea over the week or slightly longer. At baseline patient does not require home oxygen. He reports feeling weak ever since being hospitalized1/31- 2/3 for a GI bleed with enteritis secondary to C. difficile.During the hospitalization he was not scoped as there was concern for COPD. Thereafter patient follow-up with Dr. Melvyn Novas of pulmonology and it appears spirometry readings showed signs for COPD.   Assessment & Plan   Acute CHF exacerbation combined systolic and diastolic  - Possibly diastolic. Last cath showed EF 50% in June 2017 - ECHO with EF 45% - 50%, grade II diastolic CHF - currently on lasix 40 mg IV BID, per cardiology changed to PO lasix and discharge today  - weight trend in the past 72 hours       Filed  Weights   02/27/17 0500 02/28/17 0419 03/01/17 0624  Weight: 110.2 kg (243 lb) 109.9 kg (242 lb 4.6 oz) 109.1 kg (240 lb 8 oz)  - appreciate cardiology team following  - cardiology team has cleared pt for discharge   Acute respiratory failure with hypoxia - Likely secondary to the above and component of acute COPD exacerbation  - O2 saturations noted to be as low as 83% on room air at PCP office - Continue supplemental O2 to maintain sats  >92% - pt will need oxygen via [place] at home 2 L  - pt will see Dr. Melvyn Novas for COPD   Prolonged QTC - Initial QTC noted to be 574 - Rechecked 3/27 morning, 453  Hyperkalemia - resolved, WNL this AM   Hypothyroidism - TSH 2.448 - Continue levothyroxine  Nausea - Avoiding antiemetics 2/2 prolonged QTC  - resolved at this point  History of SVT/ NARVT - status post ablation  Chronic Anemia - baseline hemoglobin around 8 - continue iron supplementation  - see Hg trend below  CBC Latest Ref Rng & Units 03/01/2017 02/28/2017 02/27/2017  WBC 4.0 - 10.5 K/uL 9.3 5.2 5.8  Hemoglobin 13.0 - 17.0 g/dL 8.5(L) 8.0(L) 7.8(L)  Hematocrit 39.0 - 52.0 % 28.9(L) 28.8(L) 28.7(L)  Platelets 150 - 400 K/uL 205 194 218  - GI team consulted for assistance - no plan for an intervention at this time, outpt follow up recommended  - Dr. Benson Norway will arrange for outpatient capsule endoscopy and will notify pt   CAD s/p CABG -  no complaints of chest pain - Continue aspirin, zebeta, statin  Hyperlipidemia  - Continue atorvastatin  GERD - Continue Protonix   DVT Prophylaxis  SCD's used while inpatient   Code Status: Full  Family Communication:  wife at bedside   Disposition Plan: Home   Consultants  Cardiology  GI  Procedures   None  Antibiotics    None  Procedures/Studies: Dg Chest 2 View  Result Date: 02/25/2017 CLINICAL DATA:  Dyspnea EXAM: CHEST  2 VIEW COMPARISON:  01/03/2017 FINDINGS: The patient is status post median  sternotomy. Cardiac silhouette is borderline enlarged with aortic atherosclerosis. No aneurysm. Mild vascular congestion with small bilateral left greater than right pleural effusions. No acute nor suspicious osseous abnormalities. Degenerative changes are noted along the dorsal spine. IMPRESSION: Status post CABG. Stable cardiomegaly with mild CHF and small left greater than right pleural effusions. Electronically Signed   By: Ashley Royalty M.D.   On: 02/25/2017 20:14    Discharge Exam: Vitals:   03/01/17 2041 03/02/17 0628  BP: (!) 119/54 (!) 106/57  Pulse: 71 68  Resp: 18 18  Temp: 98.3 F (36.8 C) 97.7 F (36.5 C)   Vitals:   03/01/17 1730 03/01/17 1735 03/01/17 2041 03/02/17 0628  BP:   (!) 119/54 (!) 106/57  Pulse:   71 68  Resp:   18 18  Temp:   98.3 F (36.8 C) 97.7 F (36.5 C)  TempSrc:   Oral Oral  SpO2: (!) 81% 92% 94% 95%  Weight:    108.6 kg (239 lb 6.4 oz)  Height:        General: Pt is alert, follows commands appropriately, not in acute distress Cardiovascular: Regular rate and rhythm, S1/S2 +, no rubs, no gallops Respiratory: Clear to auscultation bilaterally, no wheezing, no crackles, no rhonchi Abdominal: Soft, non tender, not distended, bowel sounds +, no guarding Extremities: no edema, no cyanosis, pulses palpable bilaterally DP and PT   Discharge Instructions  Discharge Instructions    Diet - low sodium heart healthy    Complete by:  As directed    Increase activity slowly    Complete by:  As directed      Allergies as of 03/02/2017      Reactions   Scallops [shellfish Allergy] Other (See Comments)   Rash; shortness of breath   Fluzone [flu Virus Vaccine] Nausea And Vomiting, Palpitations, Rash      Medication List    TAKE these medications   aspirin 81 MG tablet Take 1 tablet (81 mg total) by mouth daily. Hold for 5 days What changed:  additional instructions   atorvastatin 80 MG tablet Commonly known as:  LIPITOR Take 80 mg by mouth  daily.   bisoprolol 5 MG tablet Commonly known as:  ZEBETA Take 1 tablet (5 mg total) by mouth daily.   furosemide 40 MG tablet Commonly known as:  LASIX Take 1 tablet (40 mg total) by mouth 2 (two) times daily.   ipratropium-albuterol 0.5-2.5 (3) MG/3ML Soln Commonly known as:  DUONEB Take 3 mLs by nebulization every 4 (four) hours as needed.   IRON PO Take 1 tablet by mouth daily.   levothyroxine 75 MCG tablet Commonly known as:  SYNTHROID, LEVOTHROID Take 75 mcg by mouth daily before breakfast.   pantoprazole 40 MG tablet Commonly known as:  PROTONIX Take 1 tablet (40 mg total) by mouth daily.   Turmeric 500 MG Tabs Take 500 mg by mouth daily.      Follow-up Information  Pixie Casino, MD Follow up on 03/11/2017.   Specialty:  Cardiology Why:  at 2:30 PM with his PA [facility] information: [address] [place] [address] [phone]        [name], MD Follow up.   Specialty:  Family Medicine Contact information: [address] [place] [address] [phone]        Faye Ramsay, MD Follow up.   Specialty:  Internal Medicine Why:  call my cell phone with questions [phone] Contact information: [address] [place] [address] [phone]            The results of significant diagnostics from this hospitalization (including imaging, microbiology, ancillary and laboratory) are listed below for reference.     Microbiology: No results found for this or any previous visit (from the past 240 hour(s)).   Labs: Basic Metabolic Panel:  Recent Labs Lab 02/25/17 1931 02/25/17 1952 02/27/17 0525 02/28/17 0514 03/01/17 0440 03/02/17 0337  NA 130* 135 135 136 135 135  K 5.3* 4.2 4.0 3.8 4.6 3.6  CL 92* 94* 94* 91* 90* 90*  CO2 29  --  33* 34* 33* 37*  GLUCOSE 109* 117* 154* 146* 194* 165*  BUN 16 23* 18 19 25* 21*  CREATININE 1.06 1.10 1.07 0.97 1.03  0.87  CALCIUM 8.3*  --  8.5* 8.7* 8.6* 8.5*   Liver Function Tests:  Recent Labs Lab 02/25/17 1931  AST 91*  ALT 24  ALKPHOS 54  BILITOT 1.2  PROT 6.3*  ALBUMIN 3.5   CBC:  Recent Labs Lab 02/25/17 1931  02/26/17 0942 02/27/17 0525 02/27/17 1353 02/28/17 0514 03/01/17 0440 03/02/17 0337  WBC 6.6  --  5.0 6.1 5.8 5.2 9.3 5.3  NEUTROABS 3.7  --  3.3  --   --   --   --   --   HGB 8.1*  < > 8.3* 7.7* 7.8* 8.0* 8.5* 8.2*  HCT 28.8*  < > 30.6* 27.7* 28.7* 28.8* 28.9* 29.5*  MCV 88.9  --  90.8 90.8 90.5 90.6 88.4 89.4  PLT 304  --  247 217 218 194 205 171  < > = values in this interval not displayed. Cardiac Enzymes:  Recent Labs Lab 02/26/17 0115 02/26/17 0942 02/26/17 1540  TROPONINI <0.03 <0.03 <0.03   BNP: BNP (last 3 results)  Recent Labs  05/05/16 0603 01/04/17 0337 02/25/17 1931  BNP 90.3 246.1* 349.2*    SIGNED: Time coordinating discharge:  30 minutes  Faye Ramsay, MD  [facility] 03/02/2017, 9:14 AM Pager [phone]  If 7PM-7AM, please contact night-coverage [email or web address] Password [email or web address]

## 2017-03-04 NOTE — Telephone Encounter (Signed)
Lm2cb; 03-11-17 with Almyra Deforest @ 230pm

## 2017-03-04 NOTE — Telephone Encounter (Signed)
Patient contacted regarding discharge from [facility] on 03/02/2017.  Patient understands to follow up with provider H. Meng, [place] on 03/11/17 at 2:30pm at [facility]. Patient understands discharge instructions? YES Patient understands medications and regiment? YES Patient understands to bring all medications to this visit? YES

## 2017-03-04 NOTE — Telephone Encounter (Signed)
f/u message  Pt returning Rn call. Please call back to discuss

## 2017-03-08 DIAGNOSIS — Z7984 Long term (current) use of oral hypoglycemic drugs: Secondary | ICD-10-CM | POA: Diagnosis not present

## 2017-03-08 DIAGNOSIS — I5022 Chronic systolic (congestive) heart failure: Secondary | ICD-10-CM | POA: Diagnosis not present

## 2017-03-08 DIAGNOSIS — E119 Type 2 diabetes mellitus without complications: Secondary | ICD-10-CM | POA: Diagnosis not present

## 2017-03-11 ENCOUNTER — Ambulatory Visit (INDEPENDENT_AMBULATORY_CARE_PROVIDER_SITE_OTHER): Payer: Federal, State, Local not specified - PPO | Admitting: Physician Assistant

## 2017-03-11 ENCOUNTER — Encounter: Payer: Self-pay | Admitting: Physician Assistant

## 2017-03-11 VITALS — BP 115/58 | HR 81 | Ht 67.5 in | Wt 239.0 lb

## 2017-03-11 DIAGNOSIS — D649 Anemia, unspecified: Secondary | ICD-10-CM | POA: Diagnosis not present

## 2017-03-11 DIAGNOSIS — J9611 Chronic respiratory failure with hypoxia: Secondary | ICD-10-CM

## 2017-03-11 DIAGNOSIS — I10 Essential (primary) hypertension: Secondary | ICD-10-CM | POA: Diagnosis not present

## 2017-03-11 DIAGNOSIS — I5032 Chronic diastolic (congestive) heart failure: Secondary | ICD-10-CM | POA: Diagnosis not present

## 2017-03-11 DIAGNOSIS — E039 Hypothyroidism, unspecified: Secondary | ICD-10-CM

## 2017-03-11 DIAGNOSIS — E785 Hyperlipidemia, unspecified: Secondary | ICD-10-CM | POA: Diagnosis not present

## 2017-03-11 DIAGNOSIS — I25810 Atherosclerosis of coronary artery bypass graft(s) without angina pectoris: Secondary | ICD-10-CM | POA: Diagnosis not present

## 2017-03-11 NOTE — Progress Notes (Signed)
Cardiology Office Note    Date:  03/11/2017   ID:  Jeremiah [name], DOB 1946-03-15, MRN [id number]  PCP:  Jeremiah Amel, MD  Cardiologist:  Dr. Debara [name] / Dr. Curt [name]  Chief Complaint  Patient presents with  . Follow-up    seen for Dr. 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 He had PMH of HTN, HLD, hypothyroidism, OSA, prostate CA, and CAD s/p CABG LIMA to LAD, SVG to OM1, and SVG to RPDA in [place] in 2006. In 2015, he underwent Myoview which showed possible reversible ischemia. He was referred for cardiac catheterization at [facility] [place] This demonstrated patent 3 bypass graft with multivessel coronary artery disease. He was having palpitation the time and the monitor showed PSVT. He was scheduled for possible ablation but this was delayed as he was dealing with ankle fracture and a nonhealing wound. He had a sleep study done by [facility] physicians, this demonstrated severe obstructive sleep apnea and BiPAP was recommended. Last echocardiogram in [place] was in 2015 showing EF 58% and no significant valvular disease. Last cardiac catheterization was performed on 05/08/2016, and this showed 85% left main lesion, 100% ostial OM1, 100% ostial RCA 100% mid RCA lesion, 100% proximal LAD lesion, patent SVG to PDA with 40% proximal stenosis, patent SVG to left circumflex, patent LIMA to LAD. He had AVNRT ablation on 08/22/2016. Despite atrial fibrillation is listed under his medical history list, I have not seen any evidence or previous documented atrial fibrillation.   Since January of this year, he has went to the hospital 3 times. Last hospital admission was on 02/25/2017 when he was sent over from his primary care physician's office for evaluation of weakness and nausea. Chest x-ray showed mild CHF with small bilateral pleural effusion. Patient was admitted for acute heart  failure. He was also anemic with hgb 7.7. His hypoxia was felt to be likely multifactorial with history of COPD, anemia and obstructive sleep apnea. He has not been using his sleep apnea machine due to preference. Echocardiogram obtained on 02/20/27 showed EF 45-50%, mild LVH, moderate focal basal hypertrophy of the septum, mid to distal anterior, anteroapical, inferior wall hypokinesis and decreased mobility of known coronary cusp aortic valve is borderline mild stenosis, mild MR, peak PA pressure 50 mmHg. He was discharged on 40 mg twice a day of Lasix. His discharge weight was 240 pounds.  He his weight is quite stable currently at 239 pounds on today's visit. He denies significant chest discomfort or shortness breath. He is still using oxygen at home, however he is not on oxygen in the office. Resting O2 saturation was 92%. Exam, he does have some rhonchi, however no obvious rale. He does not symptom to be fluid overload either, there is no lower extremity edema. I recommended continue on current medication. I wanted to obtain a basic metabolic panel given the Lasix, however patient says her primary care physician has gotten some labs 2 days ago, we will request the records.   Past Medical History:  Diagnosis Date  . A-fib ([place])   . CAD (coronary artery disease)    a. 2006: CABG in 2006 with LIMA-LAD, SVG-OM1, and SVG-RPDA  . Chronic pain 03/21/2016  . Essential hypertension 03/21/2016  . History of prostate cancer 03/21/2016  . HLD (hyperlipidemia)   . HTN (hypertension)   .  Hx of CABG 2006  . Hypercholesteremia 03/21/2016  . Hypothyroidism   . Medication management 03/21/2016  . Morbid obesity ([place]) 03/21/2016  . Myocardial infarct   . OSA (obstructive sleep apnea) 03/21/2016  . Pain in right ankle and joints of right foot 03/21/2016  . Paronychia 03/21/2016  . Primary insomnia 03/21/2016  . Prostate cancer ([place]) 2008  . Tobacco dependence 03/21/2016    Past Surgical History:  Procedure  Laterality Date  . ANKLE SURGERY  12/2013  . CARDIAC CATHETERIZATION N/A 05/08/2016   Procedure: Left Heart Cath and Cors/Grafts Angiography;  Surgeon: Belva Crome, MD;  Location: [facility];  Service: Cardiovascular;  Laterality: N/A;  . CORONARY ARTERY BYPASS GRAFT  2006   x3  . ELECTROPHYSIOLOGIC STUDY N/A 08/24/2016   Procedure: SVT Ablation;  Surgeon: Will Meredith Leeds, MD;  Location: [facility];  Service: Cardiovascular;  Laterality: N/A;  . PROSTATE SURGERY  2008  . PTCA      Current Medications: Outpatient Medications Prior to Visit  Medication Sig Dispense Refill  . aspirin 81 MG tablet Take 1 tablet (81 mg total) by mouth daily. Hold for 5 days (Patient taking differently: Take 81 mg by mouth daily. ) 30 tablet   . atorvastatin (LIPITOR) 80 MG tablet Take 80 mg by mouth daily.    . bisoprolol (ZEBETA) 5 MG tablet Take 1 tablet (5 mg total) by mouth daily. 60 tablet 11  . furosemide (LASIX) 40 MG tablet Take 1 tablet (40 mg total) by mouth 2 (two) times daily. 60 tablet 0  . ipratropium-albuterol (DUONEB) 0.5-2.5 (3) MG/3ML SOLN Take 3 mLs by nebulization every 4 (four) hours as needed. 360 mL 0  . IRON PO Take 1 tablet by mouth daily.    Marland Kitchen levothyroxine (SYNTHROID, LEVOTHROID) 75 MCG tablet Take 75 mcg by mouth daily before breakfast.    . pantoprazole (PROTONIX) 40 MG tablet Take 1 tablet (40 mg total) by mouth daily. 30 tablet 0  . Turmeric 500 MG TABS Take 500 mg by mouth daily.     No facility-administered medications prior to visit.      Allergies:   Scallops [shellfish allergy] and Fluzone [flu virus vaccine]   Social History   Social History  . Marital status: Married    Spouse name: N/A  . Number of children: 0  . Years of education: N/A   Occupational History  . retired - Passenger transport manager    Social History Main Topics  . Smoking status: Former Smoker    Packs/day: 0.50    Years: 60.00    Types: Cigarettes    Quit date: 12/15/2016  .  Smokeless tobacco: Never Used  . Alcohol use Yes     Comment: very seldom  . Drug use: No  . Sexual activity: Not Asked   Other Topics Concern  . None   Social History Narrative   Retired, married, no children      epworth sleepiness scale = 9 (04/13/16) (has OSA)     Family History:  The patient's family history includes Dementia in his mother; Diabetes in his sister.   ROS:   Please see the history of present illness.    ROS All other systems reviewed and are negative.   PHYSICAL EXAM:   VS:  BP (!) 115/58   Pulse 81   Ht 5' 7.5" (1.715 m)   Wt 239 lb (108.4 kg)   SpO2 92%   BMI 36.88 kg/m  GEN: Well nourished, well developed, in no acute distress  HEENT: normal  Neck: no JVD, carotid bruits, or masses Cardiac: RRR; no murmurs, rubs, or gallops,no edema  Respiratory:  clear to auscultation bilaterally, normal work of breathing GI: soft, nontender, nondistended, + BS MS: no deformity or atrophy  Skin: warm and dry, no rash Neuro:  Alert and Oriented x 3, Strength and sensation are intact Psych: euthymic mood, full affect  Wt Readings from Last 3 Encounters:  03/11/17 239 lb (108.4 kg)  03/02/17 239 lb 6.4 oz (108.6 kg)  02/12/17 248 lb (112.5 kg)      Studies/Labs Reviewed:   EKG:  EKG is not ordered today.   Recent Labs: 08/23/2016: Magnesium 1.9 02/25/2017: ALT 24; B Natriuretic Peptide 349.2 02/26/2017: TSH 2.448 03/02/2017: BUN 21; Creatinine, Ser 0.87; Hemoglobin 8.2; Platelets 171; Potassium 3.6; Sodium 135   Lipid Panel No results found for: CHOL, TRIG, HDL, CHOLHDL, VLDL, LDLCALC, LDLDIRECT  Additional studies/ records that were reviewed today include:   Cath 05/08/2016 Conclusion   1. LM lesion, 85% stenosed. 2. Ost 1st Mrg lesion, 100% stenosed. 3. SVG was injected . 4. Ost RCA to Prox RCA lesion, 100% stenosed. 5. SVG . 6. Origin to Prox Graft lesion, 40% stenosed. 7. Mid RCA to Dist RCA lesion, 100% stenosed. 8. Prox LAD lesion, 100%  stenosed. 9. LIMA .    Patent saphenous vein grafts. SVG to PDA contains eccentric 40% proximal stenosis. SVG to the circumflex is widely patent.  LIMA to the LAD is widely patent.  Native distal left main contains 75% stenosis. The dominant obtuse marginal branch is totally occluded. The native right coronary is totally occluded.  Left ventriculogram demonstrates inferior wall hypokinesis. EF 50%.  Recent chest pain in the setting of PSVT with minimal enzyme elevation likely related to demand ischemia in the setting of underlying native coronary disease.  Recommendations:  Medical therapy.  Management of PSVT with medications versus ablation.      SVT ablation 08/24/2016 CONCLUSIONS:  1. Sinus rhythm upon presentation.  2. The patient had dual AV nodal physiology with easily inducible classic AV nodal reentrant tachycardia, there were no other accessory pathways or arrhythmias induced  3. Successful radiofrequency modification of the slow AV nodal pathway  4. No inducible arrhythmias following ablation.  5. No early apparent complications.       Echo 02/27/2017 LV EF: 45% -   50%  - Procedure narrative: Transthoracic echocardiography. Image   quality was suboptimal. The study was technically difficult, as a   result of body habitus. Intravenous contrast (Definity) was   administered. - Left ventricle: The cavity size was normal. Wall thickness was   increased in a pattern of mild LVH. There was moderate focal   basal hypertrophy of the septum. Systolic function was mildly   reduced. The estimated ejection fraction was in the range of 45%   to 50%. Mid to distal anterior, anteroapical and inferior wall   hypokinesis. No mural thrombus. Doppler parameters are consistent   with pseudonormal left ventricular relaxation (grade 2 diastolic   dysfunction). The E/e&' ratio is >20, suggesting elevated LV   filling pressure. - Aortic valve: Mildly calcified leaflets. There  is decreased   mobility of the non-coronary cusp. There is borderline mild   stenosis. Mean gradient (S): 9 mm Hg. Peak gradient (S): 17 mm   Hg. Valve area (VTI): 2.7 cm^2. Valve area (Vmax): 2.47 cm^2.   Valve area (Vmean): 2.47 cm^2. - Mitral  valve: Calcified annulus. There was mild regurgitation.   Valve area by continuity equation (using LVOT flow): 1.89 cm^2. - Left atrium: The atrium was normal in size. - Tricuspid valve: There was mild regurgitation. - Pulmonary arteries: PA peak pressure: 50 mm Hg (S). - Inferior vena cava: The vessel was dilated. The respirophasic   diameter changes were blunted (< 50%), consistent with elevated   central venous pressure.  Impressions:  - Technically difficult study. Definity contrast given. LVEF 45-50%   with regional wall motion abnormalities - mid to distal anterior,   anteroapical and inferior (old) hypokinesis, mild LVH, grade 2 DD   with elevated LV filling pressure, very mild AS with a calcified   NCC which is not mobile, mild MR, mild TR, RVSP 50 mmHg, dilated   IVC, no pericardial effusion.  ASSESSMENT:    1. Chronic diastolic heart failure ([place])   2. Essential hypertension   3. Hyperlipidemia, unspecified hyperlipidemia type   4. Hypothyroidism, unspecified type   5. Coronary artery disease involving coronary bypass graft of native heart without angina pectoris   6. Chronic hypoxemic respiratory failure ([facility])   7. Anemia, unspecified type      PLAN:  In order of problems listed above:  1. Chronic diastolic heart failure: Appears to be euvolemic on physical exam. We will continue on 40 mg twice a day of Lasix. He says his primary care physician has obtained labs 2 days ago, I will hold off on placing metabolic panel and requested lab results from his primary care provider.   - Addendum: Based on requested record, lab obtained on 03/08/2017 showed creatinine 0.93. Potassium 3.9. Electrolyte and renal function appears to be  stable on current dose of Lasix.  2. CAD s/p CABG: Denies any obvious angina, history of bypass surgery in [place]. Last cardiac catheterization in 2017 showed patent grafts. Despite the drop in his ejection fraction noted on recent echocardiogram, he has never noticed any recent chest discomfort. We'll hold off on any ischemic workup.   3. Anemia: Continue to be anemic based on blood work, will defer to primary care physician for further evaluation.  4. Hypertension: Blood pressure stable 115/58. He is on bisoprolol 5 mg daily.  5. Hyperlipidemia: On Lipitor 80 mg daily  6. Hypothyroidism: on Synthroid  7. Chronic hypoxemic respiratory failure: Started on home oxygen after recent hospitalization. He is not wearing any oxygen in the office today, however his O2 saturation is 92%.    Medication Adjustments/Labs and Tests Ordered: Current medicines are reviewed at length with the patient today.  Concerns regarding medicines are outlined above.  Medication changes, Labs and Tests ordered today are listed in the Patient Instructions below. Patient Instructions  Medication Instructions:  Your physician recommends that you continue on your current medications as directed. Please refer to the Current Medication list given to you today.   Follow-Up: Your physician recommends that you schedule a follow-up appointment in: 2-3 Months with Dr. Debara [name].    Any Other Special Instructions Will Be Listed Below (If Applicable).     If you need a refill on your cardiac medications before your next appointment, please call your pharmacy.      Hilbert Corrigan, [place]  03/11/2017 5:30 PM    [address], [place], [place]  [address] Phone: [phone]; Fax: [phone]

## 2017-03-11 NOTE — Patient Instructions (Signed)
Medication Instructions:  Your physician recommends that you continue on your current medications as directed. Please refer to the Current Medication list given to you today.   Follow-Up: Your physician recommends that you schedule a follow-up appointment in: 2-3 Months with Dr. Debara Pickett.    Any Other Special Instructions Will Be Listed Below (If Applicable).     If you need a refill on your cardiac medications before your next appointment, please call your pharmacy.

## 2017-03-29 ENCOUNTER — Other Ambulatory Visit: Payer: Self-pay | Admitting: *Deleted

## 2017-03-29 DIAGNOSIS — D509 Iron deficiency anemia, unspecified: Secondary | ICD-10-CM | POA: Diagnosis not present

## 2017-03-29 DIAGNOSIS — R0600 Dyspnea, unspecified: Secondary | ICD-10-CM

## 2017-04-01 ENCOUNTER — Encounter: Payer: Self-pay | Admitting: Internal Medicine

## 2017-04-01 ENCOUNTER — Ambulatory Visit (INDEPENDENT_AMBULATORY_CARE_PROVIDER_SITE_OTHER): Payer: Federal, State, Local not specified - PPO | Admitting: Internal Medicine

## 2017-04-01 VITALS — BP 132/76 | HR 74 | Ht 67.0 in | Wt 243.0 lb

## 2017-04-01 DIAGNOSIS — J9611 Chronic respiratory failure with hypoxia: Secondary | ICD-10-CM | POA: Diagnosis not present

## 2017-04-01 DIAGNOSIS — J449 Chronic obstructive pulmonary disease, unspecified: Secondary | ICD-10-CM

## 2017-04-01 DIAGNOSIS — I5033 Acute on chronic diastolic (congestive) heart failure: Secondary | ICD-10-CM | POA: Diagnosis not present

## 2017-04-01 DIAGNOSIS — R0600 Dyspnea, unspecified: Secondary | ICD-10-CM | POA: Diagnosis not present

## 2017-04-01 DIAGNOSIS — J9612 Chronic respiratory failure with hypercapnia: Secondary | ICD-10-CM | POA: Diagnosis not present

## 2017-04-01 DIAGNOSIS — I25810 Atherosclerosis of coronary artery bypass graft(s) without angina pectoris: Secondary | ICD-10-CM

## 2017-04-01 LAB — PULMONARY FUNCTION TEST
DL/VA % pred: 85 %
DL/VA: 3.78 ml/min/mmHg/L
DLCO UNC: 18.6 ml/min/mmHg
DLCO cor % pred: 67 %
DLCO cor: 19.1 ml/min/mmHg
DLCO unc % pred: 65 %
FEF 25-75 Post: 0.49 L/sec
FEF 25-75 Pre: 0.54 L/sec
FEF2575-%Change-Post: -8 %
FEF2575-%PRED-POST: 22 %
FEF2575-%Pred-Pre: 24 %
FEV1-%CHANGE-POST: -21 %
FEV1-%PRED-POST: 35 %
FEV1-%Pred-Pre: 44 %
FEV1-PRE: 1.29 L
FEV1-Post: 1.01 L
FEV1FVC-%Change-Post: -22 %
FEV1FVC-%Pred-Pre: 74 %
FEV6-%Change-Post: 2 %
FEV6-%PRED-POST: 62 %
FEV6-%Pred-Pre: 60 %
FEV6-POST: 2.3 L
FEV6-PRE: 2.25 L
FEV6FVC-%CHANGE-POST: 1 %
FEV6FVC-%PRED-POST: 103 %
FEV6FVC-%PRED-PRE: 102 %
FVC-%CHANGE-POST: 1 %
FVC-%Pred-Post: 59 %
FVC-%Pred-Pre: 59 %
FVC-PRE: 2.33 L
FVC-Post: 2.36 L
POST FEV1/FVC RATIO: 43 %
POST FEV6/FVC RATIO: 97 %
PRE FEV6/FVC RATIO: 97 %
Pre FEV1/FVC ratio: 55 %
RV % pred: 132 %
RV: 3.04 L
TLC % pred: 89 %
TLC: 5.76 L

## 2017-04-01 MED ORDER — PANTOPRAZOLE SODIUM 40 MG PO TBEC: 40.0000 mg | DELAYED_RELEASE_TABLET | Freq: Two times a day (BID) | ORAL | 2 refills | Status: DC

## 2017-04-01 NOTE — Patient Instructions (Signed)
Protonix Take 30- 60 min before your first and last meals of the day   GERD (REFLUX)  is an extremely common cause of respiratory symptoms just like yours , many times with no obvious heartburn at all.    It can be treated with medication, but also with lifestyle changes including elevation of the head of your bed (ideally with 6 inch  bed blocks),  Smoking cessation, avoidance of late meals, excessive alcohol, and avoid fatty foods, chocolate, peppermint, colas, red wine, and acidic juices such as orange juice.  NO MINT OR MENTHOL PRODUCTS SO NO COUGH DROPS   USE SUGARLESS CANDY INSTEAD (Jolley ranchers or Stover's or Life Savers) or even ice chips will also do - the key is to swallow to prevent all throat clearing. NO OIL BASED VITAMINS - use powdered substitutes.   Please schedule a follow up office visit in 4 weeks, sooner if needed  with all medications /inhalers/ solutions in hand so we can verify exactly what you are taking. This includes all medications from all doctors and over the counters

## 2017-04-01 NOTE — Progress Notes (Signed)
PFT done today. 

## 2017-04-01 NOTE — Progress Notes (Signed)
Subjective:     Patient ID: Jeremiah [name], male   DOB: 05-24-1946,    MRN: [id number]     Brief patient profile:  71yowm quit smoking 12/15/16 with onset of doe x 2008    Admit date: 01/02/2017 Discharge date: 01/05/2017  Primary Care Physician:  [name], MD  Discharge Diagnoses:    . Upper GI bleed . Enteritis due to Clostridium difficile . SVT (supraventricular tachycardia) ([place]) . Morbid obesity ([place]) . Essential hypertension . OSA (obstructive sleep apnea) . Symptomatic anemia . Hypothyroidism, acquired      History of Present Illness   02/12/2017 1st [facility] office visit/ Wert  Re ? Copd  Chief Complaint  Patient presents with  . Pulmonary Consult    Referred by Dr. Dorthy Cooler. Pt c/o SOB for the past 10 yrs. He gets SOB when running and working "which I don't do anymore".   MMRC2 = can't walk a nl pace on a flat grade s sob but does fine slow and flat eg shopping / limited also by R chronic ankle pain p fx rec Stop lopressor (metaprolol) and start bisoprolol 5 mg twice daily  If face/eye symptoms  worse or fever go back to ER  Please schedule a follow up office visit in 4 weeks, sooner if needed with pfts on return    04/01/2017  f/u ov/Wert re: copd gold III/ 02 2lpm  Chief Complaint  Patient presents with  . Follow-up    Breathing is doing well. No new co's today.    Goal is to walk dogs beyond the end of property line but can't presently due to sob  Very confused with details of care, not clear what meds he's using for his breathing or whether they are really helping sob  No obvious day to day or daytime variability or assoc excess/ purulent sputum or mucus plugs or hemoptysis or cp or chest tightness, subjective wheeze or overt sinus or hb symptoms. No unusual exp hx or h/o childhood pna/ asthma or knowledge of premature birth.  Sleeping ok without nocturnal  or early am exacerbation  of respiratory  c/o's or need for noct saba. Also denies any  obvious fluctuation of symptoms with weather or environmental changes or other aggravating or alleviating factors except as outlined above   Current Medications, Allergies, Complete Past Medical History, Past Surgical History, Family History, and Social History were reviewed in [employer] record.  ROS  The following are not active complaints unless bolded sore throat, dysphagia, dental problems, itching, sneezing,  nasal congestion or excess/ purulent secretions, ear ache,   fever, chills, sweats, unintended wt loss, classically pleuritic or exertional cp,  orthopnea pnd or leg swelling, presyncope, palpitations, abdominal pain, anorexia, nausea, vomiting, diarrhea  or change in bowel or bladder habits, change in stools or urine, dysuria,hematuria,  rash, arthralgias, visual complaints, headache, numbness, weakness or ataxia or problems with walking or coordination,  change in mood/affect or memory.               Objective:   Physical Exam  Obese amb wm nad   04/01/2017      244   02/12/17 248 lb (112.5 kg)  02/11/17 239 lb (108.4 kg)  01/02/17 245 lb (111.1 kg)    Vital signs reviewed  - Note on arrival 02 sats  95% on RA   HEENT: nl dentition, turbinates bilaterally, and oropharynx. Nl external ear canals without cough reflex   NECK :  without JVD/Nodes/TM/ nl  carotid upstrokes bilaterally   LUNGS: no acc muscle use,  Nl contour chest which is clear to A and P bilaterally without cough on insp or exp maneuvers   CV:  RRR  no s3 or murmur or increase in P2, and no edema   ABD:  soft and nontender with nl inspiratory excursion in the supine position. No bruits or organomegaly appreciated, bowel sounds nl  MS:  Nl gait/ ext warm without deformities, calf tenderness, cyanosis or clubbing No obvious joint restrictions   SKIN: warm and dry      NEURO:  alert, approp, nl sensorium with  no motor or cerebellar deficits apparent.       I personally  reviewed images and agree with radiology impression as follows:  CXR:  02/25/17 Status post CABG. Stable cardiomegaly with mild CHF and small left greater than right pleural effusions..     Labs  reviewed:      Chemistry      Component Value Date/Time   NA 135 03/02/2017 0337   K 3.6 03/02/2017 0337   CL 90 (L) 03/02/2017 0337   CO2 37 (H) 03/02/2017 0337   BUN 21 (H) 03/02/2017 0337   CREATININE 0.87 03/02/2017 0337      Component Value Date/Time   CALCIUM 8.5 (L) 03/02/2017 0337   ALKPHOS 54 02/25/2017 1931   AST 91 (H) 02/25/2017 1931   ALT 24 02/25/2017 1931   BILITOT 1.2 02/25/2017 1931        Lab Results  Component Value Date   WBC 5.3 03/02/2017   HGB 8.2 (L) 03/02/2017   HCT 29.5 (L) 03/02/2017   MCV 89.4 03/02/2017   PLT 171 03/02/2017        Lab Results  Component Value Date   TSH 2.448 02/26/2017              BNP                       246                                                                  01/05/17     Assessment:

## 2017-04-03 DIAGNOSIS — J449 Chronic obstructive pulmonary disease, unspecified: Secondary | ICD-10-CM | POA: Insufficient documentation

## 2017-04-03 DIAGNOSIS — J9612 Chronic respiratory failure with hypercapnia: Secondary | ICD-10-CM

## 2017-04-03 DIAGNOSIS — J9611 Chronic respiratory failure with hypoxia: Secondary | ICD-10-CM | POA: Insufficient documentation

## 2017-04-03 NOTE — Assessment & Plan Note (Signed)
Body mass index is 38.06 kg/m.  -  trending down slt/ encouraged  Lab Results  Component Value Date   TSH 2.448 02/26/2017     Contributing to gerd risk/ doe/reviewed the need and the process to achieve and maintain neg calorie balance > defer f/u primary care including intermittently monitoring thyroid status

## 2017-04-03 NOTE — Assessment & Plan Note (Signed)
-    HCO3  03/02/17 = 37  -  04/01/2017 : Patient Saturations on Room Air at Rest = 96%  And  while Ambulating = 88% Patient Saturations on 2 Liters of oxygen while Ambulating = 94%  rec 2lpm 24/7 as of 04/01/2017

## 2017-04-03 NOTE — Assessment & Plan Note (Addendum)
Quit smoking 12/2016 Spirometry 02/12/2017  FEV1 0.79 (20%)  Ratio 45 but f/v loop is junk - trial of selective BB 02/12/2017  - PFT's  04/01/2017  FEV1 1.29 ( 44% ) ratio 55  p no % improvement from saba p ?  prior to study with DLCO  65/67 % corrects to 85  % for alv volume    I had an extended discussion with the patient reviewing all relevant studies completed to date and  lasting 15 to 20 minutes of a 25 minute visit on the following ongoing concerns:   Pt is Group B in terms of symptom/risk and laba/lama therefore appropriate rx at this point but he's so confused with meds that I did not want to confuse him further as relatively stable on present rx, whatever that is; however, must return with all meds in hand using a trust but verify approach to confirm accurate Medication  Reconciliation The principal here is that until we are certain that the  patients are doing what we've asked, it makes no sense to ask them to do more.   see avs for instructions unique to this ov

## 2017-04-17 DIAGNOSIS — F17200 Nicotine dependence, unspecified, uncomplicated: Secondary | ICD-10-CM | POA: Diagnosis not present

## 2017-04-17 DIAGNOSIS — E119 Type 2 diabetes mellitus without complications: Secondary | ICD-10-CM | POA: Diagnosis not present

## 2017-04-17 DIAGNOSIS — Z79899 Other long term (current) drug therapy: Secondary | ICD-10-CM | POA: Diagnosis not present

## 2017-04-17 DIAGNOSIS — E039 Hypothyroidism, unspecified: Secondary | ICD-10-CM | POA: Diagnosis not present

## 2017-04-17 DIAGNOSIS — I10 Essential (primary) hypertension: Secondary | ICD-10-CM | POA: Diagnosis not present

## 2017-04-17 DIAGNOSIS — E038 Other specified hypothyroidism: Secondary | ICD-10-CM | POA: Diagnosis not present

## 2017-05-02 ENCOUNTER — Ambulatory Visit (INDEPENDENT_AMBULATORY_CARE_PROVIDER_SITE_OTHER): Payer: Federal, State, Local not specified - PPO | Admitting: Internal Medicine

## 2017-05-02 ENCOUNTER — Encounter: Payer: Self-pay | Admitting: Internal Medicine

## 2017-05-02 VITALS — BP 122/74 | HR 77 | Ht 66.0 in | Wt 248.4 lb

## 2017-05-02 DIAGNOSIS — J9612 Chronic respiratory failure with hypercapnia: Secondary | ICD-10-CM

## 2017-05-02 DIAGNOSIS — J449 Chronic obstructive pulmonary disease, unspecified: Secondary | ICD-10-CM | POA: Diagnosis not present

## 2017-05-02 DIAGNOSIS — J9611 Chronic respiratory failure with hypoxia: Secondary | ICD-10-CM

## 2017-05-02 DIAGNOSIS — I25810 Atherosclerosis of coronary artery bypass graft(s) without angina pectoris: Secondary | ICD-10-CM

## 2017-05-02 DIAGNOSIS — I5033 Acute on chronic diastolic (congestive) heart failure: Secondary | ICD-10-CM | POA: Diagnosis not present

## 2017-05-02 MED ORDER — GLYCOPYRROLATE-FORMOTEROL 9-4.8 MCG/ACT IN AERO: 2.0000 | INHALATION_SPRAY | Freq: Two times a day (BID) | RESPIRATORY_TRACT | 2 refills | Status: DC

## 2017-05-02 MED ORDER — GLYCOPYRROLATE-FORMOTEROL 9-4.8 MCG/ACT IN AERO: 2.0000 | INHALATION_SPRAY | Freq: Two times a day (BID) | RESPIRATORY_TRACT | 3 refills | Status: DC

## 2017-05-02 NOTE — Progress Notes (Signed)
Subjective:     Patient ID: Jeremiah [name], male   DOB: December 13, 1945,    MRN: [id number]     Brief patient profile:  71yowm quit smoking 12/15/16 with onset of doe x 2008    Admit date: 01/02/2017 Discharge date: 01/05/2017  Primary Care Physician:  [name], MD  Discharge Diagnoses:    . Upper GI bleed . Enteritis due to Clostridium difficile . SVT (supraventricular tachycardia) ([place]) . Morbid obesity ([place]) . Essential hypertension . OSA (obstructive sleep apnea) . Symptomatic anemia . Hypothyroidism, acquired      History of Present Illness   02/12/2017 1st [facility] office visit/ Jeremiah [name]  Re ? Copd  Chief Complaint  Patient presents with  . Pulmonary Consult    Referred by Dr. Dorthy Cooler. Pt c/o SOB for the past 10 yrs. He gets SOB when running and working "which I don't do anymore".   MMRC2 = can't walk a nl pace on a flat grade s sob but does fine slow and flat eg shopping / limited also by R chronic ankle pain p fx rec Stop lopressor (metaprolol) and start bisoprolol 5 mg twice daily  If face/eye symptoms  worse or fever go back to ER  Please schedule a follow up office visit in 4 weeks, sooner if needed with pfts on return    04/01/2017  f/u ov/Jeremiah [name] re: copd gold III/ 02 2lpm  Chief Complaint  Patient presents with  . Follow-up    Breathing is doing well. No new co's today.    Goal is to walk dogs beyond the end of property line but can't presently due to sob   rec Protonix Take 30- 60 min before your first and last meals of the day  GERD diet  Please schedule a follow up office visit in 4 weeks, sooner if needed  with all medications /inhalers/ solutions in hand so we can verify exactly what you are taking. This includes all medications from all doctors and over the counters    05/02/2017  f/u ov/Jeremiah [name] re: COPD GOLD III / did not bring meds as requested but maint on duoneb only  Chief Complaint  Patient presents with  . Follow-up    Pt states "I still  sound like a frog and I still snore"- no new co's today. He is requesting order for POC for travel. He only uses o2 with sleep. He also requestes humidity to be added to his o2.   sleeps fine on 2lpm s am ha / not consistent with amb 02  Doe = MMRC2 = can't walk a nl pace on a flat grade s sob but does fine slow and flat on 02 and wants POC  No obvious day to day or daytime variability or assoc excess/ purulent sputum or mucus plugs or hemoptysis or cp or chest tightness, subjective wheeze or overt sinus or hb symptoms. No unusual exp hx or h/o childhood pna/ asthma or knowledge of premature birth.  Sleeping ok without nocturnal  or early am exacerbation  of respiratory  c/o's or need for noct saba. Also denies any obvious fluctuation of symptoms with weather or environmental changes or other aggravating or alleviating factors except as outlined above   Current Medications, Allergies, Complete Past Medical History, Past Surgical History, Family History, and Social History were reviewed in [employer] record.  ROS  The following are not active complaints unless bolded sore throat, dysphagia, dental problems, itching, sneezing,  nasal congestion or excess/ purulent secretions, ear  ache,   fever, chills, sweats, unintended wt loss, classically pleuritic or exertional cp,  orthopnea pnd or leg swelling, presyncope, palpitations, abdominal pain, anorexia, nausea, vomiting, diarrhea  or change in bowel or bladder habits, change in stools or urine, dysuria,hematuria,  rash, arthralgias, visual complaints, headache, numbness, weakness or ataxia or problems with walking or coordination,  change in mood/affect or memory.                      Objective:   Physical Exam  Obese amb wm nad  05/02/2017       248  04/01/2017      244   02/12/17 248 lb (112.5 kg)  02/11/17 239 lb (108.4 kg)  01/02/17 245 lb (111.1 kg)    Vital signs reviewed  - Note on arrival 02 sats  91% on  RA   HEENT: nl dentition, turbinates bilaterally, and oropharynx. Nl external ear canals without cough reflex   NECK :  without JVD/Nodes/TM/ nl carotid upstrokes bilaterally   LUNGS: no acc muscle use,  Nl contour chest with minimal bilateral insp and exp sym  rhonchi    CV:  RRR  no s3 or murmur or increase in P2, and no edema   ABD:  soft and nontender with nl inspiratory excursion in the supine position. No bruits or organomegaly appreciated, bowel sounds nl  MS:  Nl gait/ ext warm without deformities, calf tenderness, cyanosis or clubbing No obvious joint restrictions   SKIN: warm and dry      NEURO:  alert, approp, nl sensorium with  no motor or cerebellar deficits apparent.       I personally reviewed images and agree with radiology impression as follows:  CXR:  02/25/17 Status post CABG. Stable cardiomegaly with mild CHF and small left greater than right pleural effusions..     Labs  reviewed:      Chemistry      Component Value Date/Time   NA 135 03/02/2017 0337   K 3.6 03/02/2017 0337   CL 90 (L) 03/02/2017 0337   CO2 37 (H) 03/02/2017 0337   BUN 21 (H) 03/02/2017 0337   CREATININE 0.87 03/02/2017 0337      Component Value Date/Time   CALCIUM 8.5 (L) 03/02/2017 0337   ALKPHOS 54 02/25/2017 1931   AST 91 (H) 02/25/2017 1931   ALT 24 02/25/2017 1931   BILITOT 1.2 02/25/2017 1931        Lab Results  Component Value Date   WBC 5.3 03/02/2017   HGB 8.2 (L) 03/02/2017   HCT 29.5 (L) 03/02/2017   MCV 89.4 03/02/2017   PLT 171 03/02/2017        Lab Results  Component Value Date   TSH 2.448 02/26/2017              BNP                       246                                                                  01/05/17     Assessment:

## 2017-05-02 NOTE — Patient Instructions (Addendum)
Plan A = Automatic = Bevespi Take 2 puffs first thing in am and then another 2 puffs about 12 hours later.   Plan B = Backup Only use your duoneb  as a rescue medication to be used if you can't catch your breath by resting or doing a relaxed purse lip breathing pattern.  - The less you use it, the better it will work when you need it. - Ok to use the inhaler up to every 4 hours if you must but call for appointment if use goes up over your usual need     Work on inhaler technique:  relax and gently blow all the way out then take a nice smooth deep breath back in, triggering the inhaler at same time you start breathing in.  Hold for up to 5 seconds if you can. Blow out thru nose. Rinse and gargle with water when done    02 2lpm at bedtime with humidity which we will arrange   Please see patient coordinator before you leave today  to schedule ambulatory 02 titration to see if eligible for POC    See Tammy NP in 4  weeks with all your medications, even over the counter meds, separated in two separate bags, the ones you take no matter what vs the ones you stop once you feel better and take only as needed when you feel you need them.   Tammy  will generate for you a new user friendly medication calendar that will put [place] all on the same page re: your medication use.     Without this process, it simply isn't possible to assure that we are providing  your outpatient care  with  the attention to detail we feel you deserve.   If we cannot assure that you're getting that kind of care,  then we cannot manage your problem effectively from this clinic.  Once you have seen Tammy and we are sure that we're all on the same page with your medication use she will arrange follow up with me.

## 2017-05-06 DIAGNOSIS — I5033 Acute on chronic diastolic (congestive) heart failure: Secondary | ICD-10-CM | POA: Diagnosis not present

## 2017-05-06 DIAGNOSIS — G4733 Obstructive sleep apnea (adult) (pediatric): Secondary | ICD-10-CM | POA: Diagnosis not present

## 2017-05-06 NOTE — Assessment & Plan Note (Signed)
Quit smoking 12/2016 Spirometry 02/12/2017  FEV1 0.79 (20%)  Ratio 45 but f/v loop is junk - trial of selective BB 02/12/2017  - PFT's  04/01/2017  FEV1 1.29 ( 44% ) ratio 55  p no % improvement from saba p ?  prior to study with DLCO  65/67 % corrects to 85  % for alv volume  - 05/02/2017  After extensive coaching HFA effectiveness =    75% > try bevespi samples x 2 weeks    Pt is Group B in terms of symptom/risk and laba/lama therefore appropriate rx at this point.  Try bevspi and fill rx if covered  I had an extended discussion with the patient reviewing all relevant studies completed to date and  lasting 15 to 20 minutes of a 25 minute visit on the following ongoing concerns:   Formulary restrictions will be an ongoing challenge for the forseable future and I would be happy to pick an alternative if the pt will first  provide me a list of them but pt  will need to return here for training for any new device that is required eg dpi vs hfa vs respimat.    In meantime we can always provide samples so the patient never runs out of any needed respiratory medications.   Each maintenance medication was reviewed in detail including most importantly the difference between maintenance and as needed and under what circumstances the prns are to be used.  Please see AVS for specific  Instructions which are unique to this visit and I personally typed out  which were reviewed in detail in writing with the patient and a copy provided.

## 2017-05-06 NOTE — Assessment & Plan Note (Signed)
-    HCO3  03/02/17 = 37  -  04/01/2017 : Patient Saturations on Room Air at Rest = 96%  And  while Ambulating = 88% Patient Saturations on 2 Liters of oxygen while Ambulating = 94%  rec 2lpm hs and with activity as of 05/02/2017

## 2017-05-06 NOTE — Assessment & Plan Note (Signed)
Body mass index is 40.09 kg/m.  -  trending up to now the threshold criteria for severe obesity complicated by hypercarbic resp failure ? OHS component  Lab Results  Component Value Date   TSH 2.448 02/26/2017     Contributing to gerd risk/ doe/reviewed the need and the process to achieve and maintain neg calorie balance > defer f/u primary care including intermittently monitoring thyroid status

## 2017-06-01 DIAGNOSIS — I5033 Acute on chronic diastolic (congestive) heart failure: Secondary | ICD-10-CM | POA: Diagnosis not present

## 2017-06-03 ENCOUNTER — Ambulatory Visit: Payer: Federal, State, Local not specified - PPO | Admitting: Internal Medicine

## 2017-06-10 ENCOUNTER — Ambulatory Visit (INDEPENDENT_AMBULATORY_CARE_PROVIDER_SITE_OTHER): Payer: Federal, State, Local not specified - PPO | Admitting: Internal Medicine

## 2017-06-10 ENCOUNTER — Encounter: Payer: Self-pay | Admitting: Internal Medicine

## 2017-06-10 VITALS — BP 94/64 | HR 71 | Ht 66.5 in | Wt 252.0 lb

## 2017-06-10 DIAGNOSIS — I10 Essential (primary) hypertension: Secondary | ICD-10-CM | POA: Diagnosis not present

## 2017-06-10 DIAGNOSIS — Z79899 Other long term (current) drug therapy: Secondary | ICD-10-CM

## 2017-06-10 DIAGNOSIS — I5023 Acute on chronic systolic (congestive) heart failure: Secondary | ICD-10-CM

## 2017-06-10 DIAGNOSIS — G4733 Obstructive sleep apnea (adult) (pediatric): Secondary | ICD-10-CM | POA: Diagnosis not present

## 2017-06-10 HISTORY — DX: Acute on chronic systolic (congestive) heart failure: I50.23

## 2017-06-10 MED ORDER — FUROSEMIDE 40 MG PO TABS: 40.0000 mg | ORAL_TABLET | Freq: Every day | ORAL | 3 refills | Status: DC

## 2017-06-10 NOTE — Patient Instructions (Addendum)
Your physician has recommended you make the following change in your medication:  -- TAKE lasix 40mg  once daily  Your physician recommends that you return for lab work in [place] (BNP + BMET)  Your physician recommends that you schedule a follow-up appointment in [place] with Dr. Debara Pickett

## 2017-06-10 NOTE — Progress Notes (Signed)
Grossly normal   OFFICE NOTE  Chief Complaint:  Follow-up  Primary Care Physician: [name], MD  HPI:  Jeremiah [name] is a 71 y.o. 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 He is originally from [place] and his wife who is accompanying him today is originally from [place]. Mr. Lichtenwalner had an MI in 2006 and underwent coronary artery bypass grafting with a LIMA to LAD, SVG to OM1, and SVG to RPDA. He did fairly well with this however 2015 was having chest discomfort. He underwent a nuclear stress test which showed possible reversible ischemia. Therefore he was referred for cardiac catheterization at [facility] [place] [facility] [place]. That demonstrated all 3 patent bypass grafts with multivessel coronary disease. At that time he was also having episodes of palpitations and a monitor demonstrated PSVT. He was scheduled for possible ablation but is had chronic problems with an ankle fracture and nonhealing wound. He therefore never underwent ablation. He was placed on digoxin and has been on metoprolol but stopped the digoxin at some point in the past. He reports over the past 8-12 months she's had no further palpitations. He also has a history of dyslipidemia, hypothyroidism, and borderline diabetes. Recently he had a sleep study through [facility] which demonstrated severe obstructive sleep apnea and BiPAP was recommended with settings of 19/14 cm water pressure. Currently he denies any chest pain or worsening shortness of breath. His last echocardiogram from his cardiologist in [place] was a 2015 which showed an EF of 58% and no significant valvular disease.  07/26/2016  Mr. Henery returns today for follow-up. He was recently seen in the emergency department in the beginning of June for chest pain. This was associated with tachycardia and probable SVT. Troponin was noted to be elevated to 0.12. He had signs and symptoms concerning for unstable angina. He was  evaluated by my partners and felt to need cardiac catheterization. Eventually he underwent cardiac catheterization by Dr. Tamala Julian with the results as follows:  Conclusion   1. LM lesion, 85% stenosed. 2. Ost 1st Mrg lesion, 100% stenosed. 3. SVG was injected . 4. Ost RCA to Prox RCA lesion, 100% stenosed. 5. SVG . 6. Origin to Prox Graft lesion, 40% stenosed. 7. Mid RCA to Dist RCA lesion, 100% stenosed. 8. Prox LAD lesion, 100% stenosed. 9. LIMA .    Patent saphenous vein grafts. SVG to PDA contains eccentric 40% proximal stenosis. SVG to the circumflex is widely patent.  LIMA to the LAD is widely patent.  Native distal left main contains 75% stenosis. The dominant obtuse marginal branch is totally occluded. The native right coronary is totally occluded.  Left ventriculogram demonstrates inferior wall hypokinesis. EF 50%.  Recent chest pain in the setting of PSVT with minimal enzyme elevation likely related to demand ischemia in the setting of underlying native coronary disease.  Recommendations:  Medical therapy.  Management of PSVT with medications versus ablation.   Since discharge she denies any further episodes of tachycardia palpitations. He is already on a high dose of beta blocker. He's had a small amount of weight gain but has stopped smoking.  He plans to start to do more exercise and work on weight loss. I'm concerned about his recurrent palpitations and the fact that asymptomatic and has demand ischemia related to them and bypass graft insufficiency.  06/10/2017  Mr. Hoard returns today for follow-up. It's been almost a year since I last saw him. He was found to have  recurrent symptomatic SVT and underwent comprehensive EP study and was diagnosed with classic AV nodal reentry tachycardia and subsequently underwent selective radiofrequency ablation by Dr. Curt Bears. He says since that time he's had no further episodes of tachypalpitations. Since then he was hospitalized  twice, after developing C. difficile colitis and enteritis with GI bleed in January. Subsequently he developed acute congestive heart failure was found to have a mild reduction in LVEF to 45-50% by echo in March 2018. At that time he was on furosemide 40 mg daily. He had a follow-up with Almyra Deforest, PA-C in April who noted he was on Lasix 40 mg twice daily, however he reported that he was not taking that to me today. Fact he says he is now off of Lasix. He reports worsening shortness of breath and hasn't fact had a 13 pound weight gain since April. He denies any lower extremity edema. He has been working with Dr. Melvyn Novas for his shortness of breath.  PMHx:  Past Medical History:  Diagnosis Date  . A-fib ([place])   . CAD (coronary artery disease)    a. 2006: CABG in 2006 with LIMA-LAD, SVG-OM1, and SVG-RPDA  . Chronic pain 03/21/2016  . Essential hypertension 03/21/2016  . History of prostate cancer 03/21/2016  . HLD (hyperlipidemia)   . HTN (hypertension)   . Hx of CABG 2006  . Hypercholesteremia 03/21/2016  . Hypothyroidism   . Medication management 03/21/2016  . Morbid obesity ([place]) 03/21/2016  . Myocardial infarct ([place])   . OSA (obstructive sleep apnea) 03/21/2016  . Pain in right ankle and joints of right foot 03/21/2016  . Paronychia 03/21/2016  . Primary insomnia 03/21/2016  . Prostate cancer ([place]) 2008  . Tobacco dependence 03/21/2016    Past Surgical History:  Procedure Laterality Date  . ANKLE SURGERY  12/2013  . CARDIAC CATHETERIZATION N/A 05/08/2016   Procedure: Left Heart Cath and Cors/Grafts Angiography;  Surgeon: Belva Crome, MD;  Location: [facility];  Service: Cardiovascular;  Laterality: N/A;  . CORONARY ARTERY BYPASS GRAFT  2006   x3  . ELECTROPHYSIOLOGIC STUDY N/A 08/24/2016   Procedure: SVT Ablation;  Surgeon: Will Meredith Leeds, MD;  Location: [facility];  Service: Cardiovascular;  Laterality: N/A;  . PROSTATE SURGERY  2008  . PTCA      FAMHx:  Family  History  Problem Relation Age of Onset  . Dementia Mother   . Diabetes Sister     SOCHx:   reports that he quit smoking about 5 months ago. His smoking use included Cigarettes. He has a 30.00 pack-year smoking history. He has never used smokeless tobacco. He reports that he drinks alcohol. He reports that he does not use drugs.  ALLERGIES:  Allergies  Allergen Reactions  . Scallops [Shellfish Allergy] Other (See Comments)    Rash; shortness of breath  . Fluzone [Flu Virus Vaccine] Nausea And Vomiting, Palpitations and Rash    ROS: Pertinent items noted in HPI and remainder of comprehensive ROS otherwise negative.  HOME MEDS: Current Outpatient Prescriptions  Medication Sig Dispense Refill  . aspirin 81 MG tablet Take 1 tablet (81 mg total) by mouth daily. Hold for 5 days (Patient taking differently: Take 81 mg by mouth daily. ) 30 tablet   . atorvastatin (LIPITOR) 80 MG tablet Take 80 mg by mouth daily.    . bisoprolol (ZEBETA) 5 MG tablet Take 1 tablet (5 mg total) by mouth daily. 60 tablet 11  . Glycopyrrolate-Formoterol (BEVESPI AEROSPHERE) 9-4.8  MCG/ACT AERO Inhale 2 puffs into the lungs 2 (two) times daily. 1 Inhaler 2  . ipratropium-albuterol (DUONEB) 0.5-2.5 (3) MG/3ML SOLN Take 3 mLs by nebulization every 4 (four) hours as needed. 360 mL 0  . IRON PO Take 1 tablet by mouth daily.    Marland Kitchen levothyroxine (SYNTHROID, LEVOTHROID) 75 MCG tablet Take 75 mcg by mouth daily before breakfast.    . OXYGEN 2.5 lpm with sleep only AHC    . pantoprazole (PROTONIX) 40 MG tablet Take 1 tablet (40 mg total) by mouth 2 (two) times daily before a meal. 60 tablet 2  . Turmeric 500 MG TABS Take 500 mg by mouth daily.    . furosemide (LASIX) 40 MG tablet Take 1 tablet (40 mg total) by mouth daily. 90 tablet 3   No current facility-administered medications for this visit.     LABS/IMAGING: No results found for this or any previous visit (from the past 48 hour(s)). No results  found.  WEIGHTS: Wt Readings from Last 3 Encounters:  06/10/17 252 lb (114.3 kg)  05/02/17 248 lb 6.4 oz (112.7 kg)  04/01/17 243 lb (110.2 kg)    VITALS: BP 94/64   Pulse 71   Ht 5' 6.5" (1.689 m)   Wt 252 lb (114.3 kg)   BMI 40.06 kg/m   EXAM: General appearance: alert, no distress and moderately obese Neck: JVD - 3 cm above sternal notch and no carotid bruit Lungs: diminished breath sounds bilaterally and wheezes bilaterally Heart: regular rate and rhythm, S1, S2 normal, no murmur, click, rub or gallop Abdomen: soft, non-tender; bowel sounds normal; no masses,  no organomegaly Extremities: extremities normal, atraumatic, no cyanosis or edema Pulses: 2+ and symmetric Skin: Skin color, texture, turgor normal. No rashes or lesions Neurologic: GroPsych: Pleasanty normal Psych: Pleasant  EKG:  Sinus rhythm with PVCs and PACs in a bigeminal pattern at 71, possible inferior infarct, anterolateral T-wave changes - personally reviewed  ASSESSMENT: 1. Recent SVT which was symptomatic-repeat cardiac catheterization (05/2016) shows patent grafts, status post ablation of AV nodal reentry tachycardia (08/2016) 2. CAD status post three-vessel CABG in 2006 (LIMA to LAD, SVG to OM1, SVG to RPDA) in [place] 3. Patent bypass grafts by cath in 2015 4. History of PSVT-on metoprolol 5. Dyslipidemia on Lipitor 6. OSA-BiPAP recommended 7. Acute on chronic systolic congestive heart failure-LVEF 45-50%  PLAN: 1.   Mr. Cragg has been hospitalized for acute systolic congestive heart failure after an episode of colitis and GI bleeding for which she received volume and blood products. EF at that time was reduced to 45-50% although he is bypass grafts were thought to be patent by cardiac catheterization in June 2017. He was diuresed and it was recommended he was discharged on Lasix 40 mg daily, but he has not been taking Lasix, possibly since discharge as the prescription may been sent to the wrong  pharmacy. Since then there is been 13 pound weight gain. He reports being more short of breath. Exam consistent with elevated jugular venous pressure but no significant lower extremity edema. I recommended restarting Lasix 40 mg daily today. We'll recheck a metabolic profile and BNP next week and may need to increase it further.  Follow-up with me in 1 month.  Pixie Casino, MD, [facility] Attending Cardiologist [place] 06/10/2017, 5:40 PM

## 2017-06-11 ENCOUNTER — Ambulatory Visit: Payer: Federal, State, Local not specified - PPO | Admitting: Internal Medicine

## 2017-06-25 ENCOUNTER — Encounter: Payer: Federal, State, Local not specified - PPO | Attending: Family Medicine | Admitting: *Deleted

## 2017-06-25 DIAGNOSIS — E119 Type 2 diabetes mellitus without complications: Secondary | ICD-10-CM | POA: Diagnosis not present

## 2017-06-25 DIAGNOSIS — Z713 Dietary counseling and surveillance: Secondary | ICD-10-CM | POA: Insufficient documentation

## 2017-06-25 NOTE — Patient Instructions (Signed)
Plan:  Aim for 3 Carb Choices per meal (45 grams) +/- 1 either way  Aim for 0-2 Carbs per snack if hungry  Include protein in moderation with your meals and snacks Consider reading food labels for Total Carbohydrate of foods Consider  increasing your activity level by Arm Chair Exercises daily as tolerated Consider obtaining a meter and checking BG a couple times a week

## 2017-06-27 NOTE — Progress Notes (Signed)
Diabetes Self-Management Education  Visit Type: First/Initial  Appt. Start Time: 1400 Appt. End Time: 4315  06/27/2017  [name], identified by name and date of birth, is a 71 y.o. male with a diagnosis of Diabetes: Type 2. He states he was going to the gym regularly until he injured his ankle and had to be in a wheelchair for 7 months.. He has had multiple other medical problems including C-dif so he has not been to the gym for several months.   ASSESSMENT  Height 5\' 6"  (1.676 m), weight 248 lb 3.2 oz (112.6 kg). Body mass index is 40.06 kg/m.      Diabetes Self-Management Education - 06/25/17 1416      Visit Information   Visit Type First/Initial     Initial Visit   Diabetes Type Type 2   Are you currently following a meal plan? No   Are you taking your medications as prescribed? Not on Medications   Date Diagnosed 2018     Health Coping   How would you rate your overall health? Fair     Psychosocial Assessment   Patient Belief/Attitude about Diabetes Other (comment)   Self-care barriers None   Other persons present Patient   Patient Concerns Nutrition/Meal planning;Glycemic Control;Weight Control   How often do you need to have someone help you when you read instructions, pamphlets, or other written materials from your doctor or pharmacy? 1 - Never   What is the last grade level you completed in school? 3 yr college     Complications   Last HgB A1C per patient/outside source 6.5 %   How often do you check your blood sugar? 0 times/day (not testing)   Have you had a dilated eye exam in the past 12 months? Yes   Have you had a dental exam in the past 12 months? Yes   Are you checking your feet? Yes   How many days per week are you checking your feet? 1     Dietary Intake   Breakfast 1/2 of smalll watermelon    Snack (morning) 2 scrambled eggs plain    Lunch jello, celery, cream cheese, occaionally cheese and crackers   Snack (afternoon) apple tarts from  [employer], none fried, starches, any vegetables    Snack (evening) apple tart    Beverage(s) no more OJ, black coffee, seltzer water     Exercise   Exercise Type ADL's     Patient Education   Previous Diabetes Education No   Disease state  Definition of diabetes, type 1 and 2, and the diagnosis of diabetes   Nutrition management  Role of diet in the treatment of diabetes and the relationship between the three main macronutrients and blood glucose level;Food label reading, portion sizes and measuring food.;Carbohydrate counting   Physical activity and exercise  Role of exercise on diabetes management, blood pressure control and cardiac health.   Medications Reviewed patients medication for diabetes, action, purpose, timing of dose and side effects.   Monitoring Identified appropriate SMBG and/or A1C goals.   Chronic complications Relationship between chronic complications and blood glucose control   Psychosocial adjustment Role of stress on diabetes     Individualized Goals (developed by patient)   Nutrition Follow meal plan discussed;General guidelines for healthy choices and portions discussed   Physical Activity Exercise 3-5 times per week   Medications take my medication as prescribed   Monitoring  test my blood glucose as discussed  Reducing Risk examine blood glucose patterns     Post-Education Assessment   Patient understands the diabetes disease and treatment process. Demonstrates understanding / competency   Patient understands incorporating nutritional management into lifestyle. Demonstrates understanding / competency   Patient undertands incorporating physical activity into lifestyle. Demonstrates understanding / competency   Patient understands using medications safely. Demonstrates understanding / competency     Outcomes   Expected Outcomes Demonstrated interest in learning. Expect positive outcomes   Future DMSE PRN   Program Status Completed       Individualized Plan for Diabetes Self-Management Training:   Learning Objective:  Patient will have a greater understanding of diabetes self-management. Patient education plan is to attend individual and/or group sessions per assessed needs and concerns.   Plan:   Patient Instructions  Plan:  Aim for 3 Carb Choices per meal (45 grams) +/- 1 either way  Aim for 0-2 Carbs per snack if hungry  Include protein in moderation with your meals and snacks Consider reading food labels for Total Carbohydrate of foods Consider  increasing your activity level by Arm Chair Exercises daily as tolerated Consider obtaining a meter and checking BG a couple times a week  Expected Outcomes:  Demonstrated interest in learning. Expect positive outcomes  Education material provided: Living Well with Diabetes, Meal plan card and Carbohydrate counting sheet, Arm Chair Exercise handout  If problems or questions, patient to contact team via:  Phone  Future DSME appointment: PRN

## 2017-07-02 DIAGNOSIS — I5033 Acute on chronic diastolic (congestive) heart failure: Secondary | ICD-10-CM | POA: Diagnosis not present

## 2017-07-04 DIAGNOSIS — L089 Local infection of the skin and subcutaneous tissue, unspecified: Secondary | ICD-10-CM | POA: Diagnosis not present

## 2017-07-08 DIAGNOSIS — L089 Local infection of the skin and subcutaneous tissue, unspecified: Secondary | ICD-10-CM | POA: Diagnosis not present

## 2017-07-22 ENCOUNTER — Ambulatory Visit (INDEPENDENT_AMBULATORY_CARE_PROVIDER_SITE_OTHER): Payer: Federal, State, Local not specified - PPO | Admitting: Internal Medicine

## 2017-07-22 ENCOUNTER — Encounter: Payer: Self-pay | Admitting: Internal Medicine

## 2017-07-22 VITALS — BP 122/70 | HR 80 | Ht 66.5 in | Wt 244.0 lb

## 2017-07-22 DIAGNOSIS — I25810 Atherosclerosis of coronary artery bypass graft(s) without angina pectoris: Secondary | ICD-10-CM

## 2017-07-22 DIAGNOSIS — I5023 Acute on chronic systolic (congestive) heart failure: Secondary | ICD-10-CM | POA: Diagnosis not present

## 2017-07-22 DIAGNOSIS — I5022 Chronic systolic (congestive) heart failure: Secondary | ICD-10-CM | POA: Diagnosis not present

## 2017-07-22 DIAGNOSIS — I10 Essential (primary) hypertension: Secondary | ICD-10-CM

## 2017-07-22 DIAGNOSIS — Z79899 Other long term (current) drug therapy: Secondary | ICD-10-CM | POA: Diagnosis not present

## 2017-07-22 DIAGNOSIS — G4733 Obstructive sleep apnea (adult) (pediatric): Secondary | ICD-10-CM

## 2017-07-22 NOTE — Patient Instructions (Signed)
Your physician recommends that you return for lab work TODAY  Your physician wants you to follow-up in: 6 months with Dr. Hilty. You will receive a reminder letter in the mail two months in advance. If you don't receive a letter, please call our office to schedule the follow-up appointment.   

## 2017-07-22 NOTE — Progress Notes (Signed)
OFFICE NOTE  Chief Complaint:  Swelling is better, but still short of breath  Primary Care Physician: [name], MD  HPI:  Jeremiah [name] is a 71 y.o. 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 He is originally from [place] and his wife who is accompanying him today is originally from [place]. [name] had an MI in 2006 and underwent coronary artery bypass grafting with a LIMA to LAD, SVG to OM1, and SVG to RPDA. He did fairly well with this however 2015 was having chest discomfort. He underwent a nuclear stress test which showed possible reversible ischemia. Therefore he was referred for cardiac catheterization at [facility] [place]. That demonstrated all 3 patent bypass grafts with multivessel coronary disease. At that time he was also having episodes of palpitations and a monitor demonstrated PSVT. He was scheduled for possible ablation but is had chronic problems with an ankle fracture and nonhealing wound. He therefore never underwent ablation. He was placed on digoxin and has been on metoprolol but stopped the digoxin at some point in the past. He reports over the past 8-12 months she's had no further palpitations. He also has a history of dyslipidemia, hypothyroidism, and borderline diabetes. Recently he had a sleep study through [facility] which demonstrated severe obstructive sleep apnea and BiPAP was recommended with settings of 19/14 cm water pressure. Currently he denies any chest pain or worsening shortness of breath. His last echocardiogram from his cardiologist in [place] was a 2015 which showed an EF of 58% and no significant valvular disease.  07/26/2016  [name] returns today for follow-up. He was recently seen in the emergency department in the beginning of June for chest pain. This was associated with tachycardia and probable SVT. Troponin was noted to be elevated to 0.12. He had signs and symptoms concerning for  unstable angina. He was evaluated by my partners and felt to need cardiac catheterization. Eventually he underwent cardiac catheterization by Dr. Tamala Julian with the results as follows:  Conclusion   1. LM lesion, 85% stenosed. 2. Ost 1st Mrg lesion, 100% stenosed. 3. SVG was injected . 4. Ost RCA to Prox RCA lesion, 100% stenosed. 5. SVG . 6. Origin to Prox Graft lesion, 40% stenosed. 7. Mid RCA to Dist RCA lesion, 100% stenosed. 8. Prox LAD lesion, 100% stenosed. 9. LIMA .    Patent saphenous vein grafts. SVG to PDA contains eccentric 40% proximal stenosis. SVG to the circumflex is widely patent.  LIMA to the LAD is widely patent.  Native distal left main contains 75% stenosis. The dominant obtuse marginal branch is totally occluded. The native right coronary is totally occluded.  Left ventriculogram demonstrates inferior wall hypokinesis. EF 50%.  Recent chest pain in the setting of PSVT with minimal enzyme elevation likely related to demand ischemia in the setting of underlying native coronary disease.  Recommendations:  Medical therapy.  Management of PSVT with medications versus ablation.   Since discharge she denies any further episodes of tachycardia palpitations. He is already on a high dose of beta blocker. He's had a small amount of weight gain but has stopped smoking.  He plans to start to do more exercise and work on weight loss. I'm concerned about his recurrent palpitations and the fact that asymptomatic and has demand ischemia related to them and bypass graft insufficiency.  06/10/2017  [name] returns today for follow-up. It's been almost a year since I last saw him. He  was found to have recurrent symptomatic SVT and underwent comprehensive EP study and was diagnosed with classic AV nodal reentry tachycardia and subsequently underwent selective radiofrequency ablation by Dr. Curt Bears. He says since that time he's had no further episodes of tachypalpitations. Since then  he was hospitalized twice, after developing C. difficile colitis and enteritis with GI bleed in January. Subsequently he developed acute congestive heart failure was found to have a mild reduction in LVEF to 45-50% by echo in March 2018. At that time he was on furosemide 40 mg daily. He had a follow-up with Almyra Deforest, PA-C in April who noted he was on Lasix 40 mg twice daily, however he reported that he was not taking that to me today. Fact he says he is now off of Lasix. He reports worsening shortness of breath and hasn't fact had a 13 pound weight gain since April. He denies any lower extremity edema. He has been working with Dr. Melvyn Novas for his shortness of breath.  07/22/2017  Mr. Hands is back today for follow-up. He restarted his Lasix at my direction his weight is now down from 252-244. He's had significant improvement in swelling and reports some improvement in his breathing however he says it still not good. He does have a mildly reduced EF of 45-50% but also has some chronic lung disease which is likely contributing. He says he gets short of breath while doing certain activities in the yard and then comes inside and uses his oxygen which improves him almost immediately. He denies any orthopnea. He has not had any recent or productive cough. He has a follow-up visit with his pulmonologist tomorrow. I had ordered lab work including a metabolic profile and BNP which was never obtained.  PMHx:  Past Medical History:  Diagnosis Date  . A-fib ([place])   . CAD (coronary artery disease)    a. 2006: CABG in 2006 with LIMA-LAD, SVG-OM1, and SVG-RPDA  . Chronic pain 03/21/2016  . Essential hypertension 03/21/2016  . History of prostate cancer 03/21/2016  . HLD (hyperlipidemia)   . HTN (hypertension)   . Hx of CABG 2006  . Hypercholesteremia 03/21/2016  . Hypothyroidism   . Medication management 03/21/2016  . Morbid obesity ([place]) 03/21/2016  . Myocardial infarct ([place])   . OSA (obstructive sleep apnea)  03/21/2016  . Pain in right ankle and joints of right foot 03/21/2016  . Paronychia 03/21/2016  . Primary insomnia 03/21/2016  . Prostate cancer ([place]) 2008  . Tobacco dependence 03/21/2016    Past Surgical History:  Procedure Laterality Date  . ANKLE SURGERY  12/2013  . CARDIAC CATHETERIZATION N/A 05/08/2016   Procedure: Left Heart Cath and Cors/Grafts Angiography;  Surgeon: Belva Crome, MD;  Location: [facility];  Service: Cardiovascular;  Laterality: N/A;  . CORONARY ARTERY BYPASS GRAFT  2006   x3  . ELECTROPHYSIOLOGIC STUDY N/A 08/24/2016   Procedure: SVT Ablation;  Surgeon: Will Meredith Leeds, MD;  Location: [facility];  Service: Cardiovascular;  Laterality: N/A;  . PROSTATE SURGERY  2008  . PTCA      FAMHx:  Family History  Problem Relation Age of Onset  . Dementia Mother   . Diabetes Sister     SOCHx:   reports that he quit smoking about 7 months ago. His smoking use included Cigarettes. He has a 30.00 pack-year smoking history. He has never used smokeless tobacco. He reports that he drinks alcohol. He reports that he does not use drugs.  ALLERGIES:  Allergies  Allergen Reactions  . Scallops [Shellfish Allergy] Other (See Comments)    Rash; shortness of breath  . Fluzone [Flu Virus Vaccine] Nausea And Vomiting, Palpitations and Rash    ROS: Pertinent items noted in HPI and remainder of comprehensive ROS otherwise negative.  HOME MEDS: Current Outpatient Prescriptions  Medication Sig Dispense Refill  . aspirin 81 MG tablet Take 1 tablet (81 mg total) by mouth daily. Hold for 5 days (Patient taking differently: Take 81 mg by mouth daily. ) 30 tablet   . atorvastatin (LIPITOR) 80 MG tablet Take 80 mg by mouth daily.    . bisoprolol (ZEBETA) 5 MG tablet Take 1 tablet (5 mg total) by mouth daily. 60 tablet 11  . furosemide (LASIX) 40 MG tablet Take 1 tablet (40 mg total) by mouth daily. 90 tablet 3  . Glycopyrrolate-Formoterol (BEVESPI AEROSPHERE) 9-4.8  MCG/ACT AERO Inhale 2 puffs into the lungs 2 (two) times daily. 1 Inhaler 2  . ipratropium-albuterol (DUONEB) 0.5-2.5 (3) MG/3ML SOLN Take 3 mLs by nebulization every 4 (four) hours as needed. 360 mL 0  . IRON PO Take 1 tablet by mouth daily.    Marland Kitchen levothyroxine (SYNTHROID, LEVOTHROID) 75 MCG tablet Take 75 mcg by mouth daily before breakfast.    . OXYGEN 2.5 lpm with sleep only AHC    . pantoprazole (PROTONIX) 40 MG tablet Take 1 tablet (40 mg total) by mouth 2 (two) times daily before a meal. 60 tablet 2  . Turmeric 500 MG TABS Take 500 mg by mouth daily.     No current facility-administered medications for this visit.     LABS/IMAGING: No results found for this or any previous visit (from the past 48 hour(s)). No results found.  WEIGHTS: Wt Readings from Last 3 Encounters:  07/22/17 244 lb (110.7 kg)  06/25/17 248 lb 3.2 oz (112.6 kg)  06/10/17 252 lb (114.3 kg)    VITALS: BP 122/70   Pulse 80   Ht 5' 6.5" (1.689 m)   Wt 244 lb (110.7 kg)   BMI 38.79 kg/m   EXAM: General appearance: alert, no distress and moderately obese Neck: no carotid bruit and no JVD Lungs: diminished breath sounds bilaterally and wheezes bilaterally Heart: regular rate and rhythm, S1, S2 normal, no murmur, click, rub or gallop Abdomen: soft, non-tender; bowel sounds normal; no masses,  no organomegaly Extremities: extremities normal, atraumatic, no cyanosis or edema Pulses: 2+ and symmetric Skin: Skin color, texture, turgor normal. No rashes or lesions Neurologic: Grossly normal Psych: Pleasant  EKG: Deferred  ASSESSMENT: 1. Recent SVT which was symptomatic-repeat cardiac catheterization (05/2016) shows patent grafts, status post ablation of AV nodal reentry tachycardia (08/2016) 2. CAD status post three-vessel CABG in 2006 (LIMA to LAD, SVG to OM1, SVG to RPDA) in [place] 3. Patent bypass grafts by cath in 2015 4. History of PSVT-on metoprolol 5. Dyslipidemia on Lipitor 6. OSA-BiPAP  recommended 7. Acute on chronic systolic congestive heart failure-LVEF 45-50%  PLAN: 1.   Mr. Chmiel seems improved on Lasix with a recent 8 pound weight loss and significant improvement in his edema. He'll need to stay on his current dose of diuretic. I like to recheck a metabolic profile and a BNP. He has follow-up with his pulmonologist tomorrow. He has had some shortness of breath and I suspect may need to use his oxygen more regularly. Ambulatory air saturations in April were 88%.  Follow-up 6 months.  Pixie Casino, MD, [facility] Attending Cardiologist [facility]  [place] 07/22/2017, 2:46 PM

## 2017-07-23 ENCOUNTER — Ambulatory Visit: Payer: Federal, State, Local not specified - PPO | Admitting: Internal Medicine

## 2017-07-23 DIAGNOSIS — E119 Type 2 diabetes mellitus without complications: Secondary | ICD-10-CM | POA: Diagnosis not present

## 2017-07-23 DIAGNOSIS — E039 Hypothyroidism, unspecified: Secondary | ICD-10-CM | POA: Diagnosis not present

## 2017-07-23 DIAGNOSIS — D509 Iron deficiency anemia, unspecified: Secondary | ICD-10-CM | POA: Diagnosis not present

## 2017-07-23 DIAGNOSIS — I10 Essential (primary) hypertension: Secondary | ICD-10-CM | POA: Diagnosis not present

## 2017-07-23 DIAGNOSIS — Z79899 Other long term (current) drug therapy: Secondary | ICD-10-CM | POA: Diagnosis not present

## 2017-07-23 LAB — BASIC METABOLIC PANEL
BUN/Creatinine Ratio: 24 (ref 10–24)
BUN: 28 mg/dL — AB (ref 8–27)
CALCIUM: 9.3 mg/dL (ref 8.6–10.2)
CO2: 26 mmol/L (ref 20–29)
CREATININE: 1.18 mg/dL (ref 0.76–1.27)
Chloride: 92 mmol/L — ABNORMAL LOW (ref 96–106)
GFR calc Af Amer: 72 mL/min/{1.73_m2} (ref >59)
GFR, EST NON AFRICAN AMERICAN: 62 mL/min/{1.73_m2} (ref >59)
GLUCOSE: 216 mg/dL — AB (ref 65–99)
Potassium: 4.1 mmol/L (ref 3.5–5.2)
SODIUM: 138 mmol/L (ref 134–144)

## 2017-07-23 LAB — PRO B NATRIURETIC PEPTIDE: NT-Pro BNP: 194 pg/mL (ref 0–376)

## 2017-07-26 DIAGNOSIS — E1165 Type 2 diabetes mellitus with hyperglycemia: Secondary | ICD-10-CM | POA: Diagnosis not present

## 2017-08-02 DIAGNOSIS — I5033 Acute on chronic diastolic (congestive) heart failure: Secondary | ICD-10-CM | POA: Diagnosis not present

## 2017-08-06 DIAGNOSIS — M7022 Olecranon bursitis, left elbow: Secondary | ICD-10-CM | POA: Diagnosis not present

## 2017-08-22 DIAGNOSIS — N471 Phimosis: Secondary | ICD-10-CM | POA: Diagnosis not present

## 2017-08-23 DIAGNOSIS — E119 Type 2 diabetes mellitus without complications: Secondary | ICD-10-CM | POA: Diagnosis not present

## 2017-08-23 DIAGNOSIS — I10 Essential (primary) hypertension: Secondary | ICD-10-CM | POA: Diagnosis not present

## 2017-08-26 ENCOUNTER — Ambulatory Visit (INDEPENDENT_AMBULATORY_CARE_PROVIDER_SITE_OTHER): Payer: Federal, State, Local not specified - PPO | Admitting: Internal Medicine

## 2017-08-26 ENCOUNTER — Encounter: Payer: Self-pay | Admitting: Internal Medicine

## 2017-08-26 VITALS — BP 124/70 | HR 65 | Ht 66.5 in | Wt 246.6 lb

## 2017-08-26 DIAGNOSIS — J9612 Chronic respiratory failure with hypercapnia: Secondary | ICD-10-CM

## 2017-08-26 DIAGNOSIS — J449 Chronic obstructive pulmonary disease, unspecified: Secondary | ICD-10-CM

## 2017-08-26 DIAGNOSIS — J9611 Chronic respiratory failure with hypoxia: Secondary | ICD-10-CM | POA: Diagnosis not present

## 2017-08-26 NOTE — Patient Instructions (Signed)
02 2lpm at bedtime and with sustained  Walking  - it will help you lose wt to keep your 02 above 90% while exerting   Please see patient coordinator before you leave today  to schedule pulmonary rehab   Please schedule a follow up visit in 6  months but call sooner if needed

## 2017-08-26 NOTE — Progress Notes (Signed)
Subjective:     Patient ID: Jeremiah [name], male   DOB: Jun 27, 1946,    MRN: [id number]     Brief patient profile:  32 yowm quit smoking 12/15/16 with onset of doe x 2008    Admit date: 01/02/2017 Discharge date: 01/05/2017  Primary Care Physician:  [name], MD  Discharge Diagnoses:    . Upper GI bleed . Enteritis due to Clostridium difficile . SVT (supraventricular tachycardia) ([place]) . Morbid obesity ([place]) . Essential hypertension . OSA (obstructive sleep apnea) . Symptomatic anemia . Hypothyroidism, acquired      History of Present Illness   02/12/2017 1st [facility] office visit/ Karime Scheuermann  Re ? Copd  Chief Complaint  Patient presents with  . Pulmonary Consult    Referred by Dr. Dorthy Cooler. Pt c/o SOB for the past 10 yrs. He gets SOB when running and working "which I don't do anymore".   MMRC2 = can't walk a nl pace on a flat grade s sob but does fine slow and flat eg shopping / limited also by R chronic ankle pain p fx rec Stop lopressor (metaprolol) and start bisoprolol 5 mg twice daily  If face/eye symptoms  worse or fever go back to ER  Please schedule a follow up office visit in 4 weeks, sooner if needed with pfts on return    04/01/2017  f/u ov/Laurelle Skiver re: copd gold III/ 02 2lpm  Chief Complaint  Patient presents with  . Follow-up    Breathing is doing well. No new co's today.    Goal is to walk dogs beyond the end of property line but can't presently due to sob   rec Protonix Take 30- 60 min before your first and last meals of the day  GERD diet  Please schedule a follow up office visit in 4 weeks, sooner if needed  with all medications /inhalers/ solutions in hand so we can verify exactly what you are taking. This includes all medications from all doctors and over the counters    05/02/2017  f/u ov/Yesica Kemler re: COPD GOLD III / did not bring meds as requested but maint on duoneb only  Chief Complaint  Patient presents with  . Follow-up    Pt states "I still  sound like a frog and I still snore"- no new co's today. He is requesting order for POC for travel. He only uses o2 with sleep. He also requestes humidity to be added to his o2.   sleeps fine on 2lpm s am ha / not consistent with amb 02  Doe = MMRC2 = can't walk a nl pace on a flat grade s sob but does fine slow and flat on 02 and wants POC rec 02 2lpm at bedtime with humidity which we will arrange  Please see patient coordinator before you leave today  to schedule ambulatory 02 titration to see if eligible for POC    08/26/2017  f/u ov/Aniah Pauli re:  Out of bevespi x one month -  no change in symptoms / 02 2lpm hs and prn  Chief Complaint  Patient presents with  . Follow-up    Breathing is doing well. He uses Duoneb 2 x per wk on average.  still doe x MMRC2 = can't walk a nl pace on a flat grade s sob but does fine slow and flat eg shopping with /without 02 and says sats vary, typically sits down once sob and then use 02 to catch his breath rather than with ex as rec  Uses  2lpm hs and sleeps fine flat  Only uses duoenb when over does it, not noct or at rest typically   No obvious day to day or daytime variability or assoc excess/ purulent sputum or mucus plugs or hemoptysis or cp or chest tightness, subjective wheeze or overt sinus or hb symptoms. No unusual exp hx or h/o childhood pna/ asthma or knowledge of premature birth.  Sleeping ok flat on 2lpm  without nocturnal  or early am exacerbation  of respiratory  c/o's or need for noct saba. Also denies any obvious fluctuation of symptoms with weather or environmental changes or other aggravating or alleviating factors except as outlined above   Current Allergies, Complete Past Medical History, Past Surgical History, Family History, and Social History were reviewed in [employer] record.  ROS  The following are not active complaints unless bolded sore throat, dysphagia, dental problems, itching, sneezing,  nasal congestion  or disharge of excess mucus or purulent secretions, ear ache,   fever, chills, sweats, unintended wt loss or wt gain, classically pleuritic or exertional cp,  orthopnea pnd or leg swelling, presyncope, palpitations, abdominal pain, anorexia, nausea, vomiting, diarrhea  or change in bowel habits or bladder habits, change in stools or change in urine, dysuria, hematuria,  rash, arthralgias, visual complaints, headache, numbness, weakness or ataxia or problems with walking or coordination,  change in mood/affect or memory.        Current Meds  Medication Sig  . aspirin 81 MG tablet Take 1 tablet (81 mg total) by mouth daily. Hold for 5 days (Patient taking differently: Take 81 mg by mouth daily. )  . atorvastatin (LIPITOR) 80 MG tablet Take 80 mg by mouth daily.  . bisoprolol (ZEBETA) 5 MG tablet Take 1 tablet (5 mg total) by mouth daily.  . furosemide (LASIX) 40 MG tablet Take 1 tablet (40 mg total) by mouth daily.  Marland Kitchen glimepiride (AMARYL) 1 MG tablet Take 1 tablet by mouth daily.  Marland Kitchen ipratropium-albuterol (DUONEB) 0.5-2.5 (3) MG/3ML SOLN Take 3 mLs by nebulization every 4 (four) hours as needed.  . IRON PO Take 1 tablet by mouth daily.  Marland Kitchen ketoconazole (NIZORAL) 2 % shampoo AS DIRECTED  . levothyroxine (SYNTHROID, LEVOTHROID) 75 MCG tablet Take 75 mcg by mouth daily before breakfast.  . metFORMIN (GLUCOPHAGE) 1000 MG tablet Take 1,000 mg by mouth daily with breakfast.  . OXYGEN 2.5 lpm with sleep only AHC  . pantoprazole (PROTONIX) 40 MG tablet Take 1 tablet (40 mg total) by mouth 2 (two) times daily before a meal.  . Turmeric 500 MG TABS Take 500 mg by mouth daily.                      Objective:   Physical Exam  Obese amb wm nad  08/26/2017       246  05/02/2017       248  04/01/2017      244   02/12/17 248 lb (112.5 kg)  02/11/17 239 lb (108.4 kg)  01/02/17 245 lb (111.1 kg)    Vital signs reviewed  - Note on arrival 02 sats  91% on RA   HEENT: nl dentition, turbinates  bilaterally, and oropharynx. Nl external ear canals without cough reflex   NECK :  without JVD/Nodes/TM/ nl carotid upstrokes bilaterally   LUNGS: no acc muscle use,  Nl contour chest with distant bs, no audible wheeze    CV:  RRR  no s3 or murmur or  increase in P2, and no edema   ABD:  soft and nontender with nl inspiratory excursion in the supine position. No bruits or organomegaly appreciated, bowel sounds nl  MS:  Nl gait/ ext warm without deformities, calf tenderness, cyanosis or clubbing No obvious joint restrictions   SKIN: warm and dry      NEURO:  alert, approp, nl sensorium with  no motor or cerebellar deficits apparent.                Assessment:

## 2017-08-26 NOTE — Assessment & Plan Note (Signed)
Quit smoking 12/2016 Spirometry 02/12/2017  FEV1 0.79 (20%)  Ratio 45 but f/v loop is junk - trial of selective BB 02/12/2017  - PFT's  04/01/2017  FEV1 1.29 ( 44% ) ratio 55  p no % improvement from saba p ?  prior to study with DLCO  65/67 % corrects to 85  % for alv volume  - 05/02/2017  After extensive coaching HFA effectiveness =    75% > try bevespi samples x 2 weeks  > no objective change so did not continue   He really can't tell much difference between laba/lama maint vs prn nebs and at this point all rx is directed at treating symptoms, not attempting to change the natural hx of dz, so fine with me to just use saba/sama prn  But would also benefit from rehab > done   I had an extended discussion with the patient reviewing all relevant studies completed to date and  lasting 15 to 20 minutes of a 25 minute visit    Each maintenance medication was reviewed in detail including most importantly the difference between maintenance and prns and under what circumstances the prns are to be triggered using an action plan format that is not reflected in the computer generated alphabetically organized AVS.    Please see AVS for specific instructions unique to this visit that I personally wrote and verbalized to the the pt in detail and then reviewed with pt  by my nurse highlighting any  changes in therapy recommended at today's visit to their plan of care.

## 2017-08-26 NOTE — Assessment & Plan Note (Signed)
Body mass index is 39.21 kg/m.  -  trending down slightly/ strongly encouraged  Lab Results  Component Value Date   TSH 2.448 02/26/2017     Contributing to gerd risk/ doe/reviewed the need and the process to achieve and maintain neg calorie balance > defer f/u primary care including intermittently monitoring thyroid status

## 2017-09-01 DIAGNOSIS — I5033 Acute on chronic diastolic (congestive) heart failure: Secondary | ICD-10-CM | POA: Diagnosis not present

## 2017-09-26 DIAGNOSIS — I10 Essential (primary) hypertension: Secondary | ICD-10-CM | POA: Diagnosis not present

## 2017-09-26 DIAGNOSIS — E1165 Type 2 diabetes mellitus with hyperglycemia: Secondary | ICD-10-CM | POA: Diagnosis not present

## 2017-09-26 DIAGNOSIS — L02439 Carbuncle of limb, unspecified: Secondary | ICD-10-CM | POA: Diagnosis not present

## 2017-09-27 ENCOUNTER — Encounter (HOSPITAL_COMMUNITY): Payer: Self-pay

## 2017-09-27 ENCOUNTER — Encounter (HOSPITAL_COMMUNITY)
Admission: RE | Admit: 2017-09-27 | Discharge: 2017-09-27 | Disposition: A | Discharge status: Home / Self Care | Payer: Federal, State, Local not specified - PPO | Source: Ambulatory Visit | Attending: Internal Medicine | Admitting: Internal Medicine

## 2017-09-27 VITALS — BP 125/63 | HR 67 | Resp 18 | Ht 66.0 in | Wt 245.6 lb

## 2017-09-27 DIAGNOSIS — J449 Chronic obstructive pulmonary disease, unspecified: Secondary | ICD-10-CM

## 2017-09-27 NOTE — Progress Notes (Signed)
Jeremiah [name] 71 y.o. male Pulmonary Rehab Orientation Note Patient arrived today in Cardiac and Pulmonary Rehab for orientation to Pulmonary Rehab. He was transported from [employer] via wheel chair. He does not carry portable oxygen, however it has been prescribed for heavy exertion at 2.5 liters per min. Patient is non-compliant with exertional oxygen and nighttime CPAP use. Per pt, he uses oxygen occasionally. Color good, skin warm and dry. Patient is oriented to time and place. Patient's medical history, psychosocial health, and medications reviewed. Psychosocial assessment reveals pt lives with their spouse. 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 He and his wife from [place] 1 1/2 years ago to [place]. Pt hobbies include construction/home remodeling. Pt reports his stress level is low. Areas of stress/anxiety include Health.  Pt does not exhibit signs of depression. PHQ2/9 score 0/na. Pt shows good  coping skills with positive outlook. He is offered emotional support and reassurance. Will continue to monitor and evaluate progress toward psychosocial goal(s) of remaining positive about his ability to loose weight and manage his diabetes. Physical assessment reveals heart rate is normal, breath sounds clear to auscultation, no wheezes, rales, or rhonchi. Grip strength equal, strong. Distal pulses palpable. Patient reports he does take medications as prescribed. Patient states he follows a Regular diet. The patient reports no specific efforts to gain or lose weight.. Patient's weight will be monitored closely. Demonstration and practice of PLB using pulse oximeter. Patient able to return demonstration satisfactorily. Safety and hand hygiene in the exercise area reviewed with patient. Patient voices understanding of the information reviewed. Department expectations discussed with patient and achievable goals were set. The patient shows enthusiasm about attending the program and we look forward to working  with this nice gentleman. The patient is scheduled for a 6 min walk test on 10/03/17 and to begin exercise on 10/10/17 at 1030.   45 minutes was spent on a variety of activities such as assessment of the patient, obtaining baseline data including height, weight, BMI, and grip strength, verifying medical history, allergies, and current medications, and teaching patient strategies for performing tasks with less respiratory effort with emphasis on pursed lip breathing.

## 2017-10-02 DIAGNOSIS — I5033 Acute on chronic diastolic (congestive) heart failure: Secondary | ICD-10-CM | POA: Diagnosis not present

## 2017-10-03 ENCOUNTER — Encounter (HOSPITAL_COMMUNITY)
Admission: RE | Admit: 2017-10-03 | Discharge: 2017-10-03 | Disposition: A | Discharge status: Home / Self Care | Payer: Federal, State, Local not specified - PPO | Source: Ambulatory Visit | Attending: Internal Medicine | Admitting: Internal Medicine

## 2017-10-03 DIAGNOSIS — J449 Chronic obstructive pulmonary disease, unspecified: Secondary | ICD-10-CM

## 2017-10-03 DIAGNOSIS — E119 Type 2 diabetes mellitus without complications: Secondary | ICD-10-CM | POA: Diagnosis not present

## 2017-10-03 DIAGNOSIS — Z713 Dietary counseling and surveillance: Secondary | ICD-10-CM | POA: Insufficient documentation

## 2017-10-03 NOTE — Progress Notes (Signed)
Pulmonary Individual Treatment Plan  Patient Details  Name: Jeremiah [name] MRN: [id number] Date of Birth: 1946/09/22 Referring Provider:     Pulmonary Rehab Walk Test from 10/03/2017 in [place]  Referring Provider  Dr. Melvyn Novas      Initial Encounter Date:    Pulmonary Rehab Walk Test from 10/03/2017 in [place]  Date  10/03/17  Referring Provider  Dr. Melvyn Novas      Visit Diagnosis: Chronic obstructive pulmonary disease, unspecified COPD type ([place])  Patient's Home Medications on Admission:   Current Outpatient Prescriptions:  .  aspirin 81 MG tablet, Take 1 tablet (81 mg total) by mouth daily. Hold for 5 days (Patient taking differently: Take 81 mg by mouth daily. ), Disp: 30 tablet, Rfl:  .  atorvastatin (LIPITOR) 80 MG tablet, Take 80 mg by mouth daily., Disp: , Rfl:  .  betamethasone dipropionate 0.05 % lotion, Apply topically daily. Apply to affected area daily, Disp: , Rfl:  .  bisoprolol (ZEBETA) 5 MG tablet, Take 1 tablet (5 mg total) by mouth daily., Disp: 60 tablet, Rfl: 11 .  furosemide (LASIX) 40 MG tablet, Take 1 tablet (40 mg total) by mouth daily., Disp: 90 tablet, Rfl: 3 .  glimepiride (AMARYL) 1 MG tablet, Take 1 tablet by mouth daily., Disp: , Rfl:  .  ipratropium-albuterol (DUONEB) 0.5-2.5 (3) MG/3ML SOLN, Take 3 mLs by nebulization every 4 (four) hours as needed., Disp: 360 mL, Rfl: 0 .  IRON PO, Take 1 tablet by mouth daily., Disp: , Rfl:  .  ketoconazole (NIZORAL) 2 % shampoo, AS DIRECTED, Disp: , Rfl:  .  levothyroxine (SYNTHROID, LEVOTHROID) 75 MCG tablet, Take 75 mcg by mouth daily before breakfast., Disp: , Rfl:  .  metFORMIN (GLUCOPHAGE) 1000 MG tablet, Take 1,000 mg by mouth daily with breakfast., Disp: , Rfl:  .  OXYGEN, 2.5 lpm with sleep only AHC, Disp: , Rfl:  .  pantoprazole (PROTONIX) 40 MG tablet, Take 1 tablet (40 mg total) by mouth 2 (two) times daily before a meal., Disp: 60 tablet, Rfl: 2 .   Turmeric 500 MG TABS, Take 500 mg by mouth daily., Disp: , Rfl:   Past Medical History: Past Medical History:  Diagnosis Date  . A-fib ([place])   . CAD (coronary artery disease)    a. 2006: CABG in 2006 with LIMA-LAD, SVG-OM1, and SVG-RPDA  . Chronic pain 03/21/2016  . Essential hypertension 03/21/2016  . History of prostate cancer 03/21/2016  . HLD (hyperlipidemia)   . HTN (hypertension)   . Hx of CABG 2006  . Hypercholesteremia 03/21/2016  . Hypothyroidism   . Medication management 03/21/2016  . Morbid obesity ([place]) 03/21/2016  . Myocardial infarct ([place])   . OSA (obstructive sleep apnea) 03/21/2016  . Pain in right ankle and joints of right foot 03/21/2016  . Paronychia 03/21/2016  . Primary insomnia 03/21/2016  . Prostate cancer ([place]) 2008  . Tobacco dependence 03/21/2016    Tobacco Use: History  Smoking Status  . Former Smoker  . Packs/day: 0.50  . Years: 60.00  . Types: Cigarettes  . Quit date: 12/15/2016  Smokeless Tobacco  . Never Used    Labs: Recent Review Flowsheet Data    Labs for ITP Cardiac and Pulmonary Rehab Latest Ref Rng & Units 05/05/2016 02/25/2017 02/25/2017   HCO3 20.0 - 28.0 mmol/L - 36.1(H) -   TCO2 0 - 100 mmol/L 26 38 33   O2SAT % - 86.0 -  Capillary Blood Glucose: Lab Results  Component Value Date   GLUCAP 115 (H) 05/08/2016   GLUCAP 160 (H) 05/08/2016     Pulmonary Assessment Scores:     Pulmonary Assessment Scores    Row Name 10/03/17 1644         ADL UCSD   ADL Phase Entry       mMRC Score   mMRC Score 2        Pulmonary Function Assessment:     Pulmonary Function Assessment - 09/27/17 1304      Breath   Bilateral Breath Sounds Clear;Decreased   Shortness of Breath Yes;Limiting activity      Exercise Target Goals: Date: 10/03/17  Exercise Program Goal: Individual exercise prescription set with THRR, safety & activity barriers. Participant demonstrates ability to understand and report RPE using BORG scale, to  self-measure pulse accurately, and to acknowledge the importance of the exercise prescription.  Exercise Prescription Goal: Starting with aerobic activity 30 plus minutes a day, 3 days per week for initial exercise prescription. Provide home exercise prescription and guidelines that participant acknowledges understanding prior to discharge.  Activity Barriers & Risk Stratification:     Activity Barriers & Cardiac Risk Stratification - 09/27/17 1238      Activity Barriers & Cardiac Risk Stratification   Activity Barriers Shortness of Breath;Deconditioning;Joint Problems      6 Minute Walk:     6 Minute Walk    Row Name 10/03/17 1639         6 Minute Walk   Phase Initial     Distance 1000 feet     Walk Time 6 minutes     # of Rest Breaks 0     MPH 1.89     METS 2.45     RPE 13     Perceived Dyspnea  1     Symptoms Yes (comment)     Comments 7/10 ankle pain     Resting HR 93 bpm     Resting BP 144/70     Resting Oxygen Saturation  93 %     Exercise Oxygen Saturation  during 6 min walk 89 %     Max Ex. HR 116 bpm     Max Ex. BP 160/72       Interval HR   1 Minute HR 94     2 Minute HR 94     3 Minute HR 104     4 Minute HR 112     5 Minute HR 116     6 Minute HR 127     Interval Heart Rate? Yes       Interval Oxygen   Interval Oxygen? Yes     Baseline Oxygen Saturation % 93 %     1 Minute Oxygen Saturation % 92 %     1 Minute Liters of Oxygen 0 L     2 Minute Oxygen Saturation % 89 %     2 Minute Liters of Oxygen 0 L     3 Minute Oxygen Saturation % 91 %     3 Minute Liters of Oxygen 0 L     4 Minute Oxygen Saturation % 90 %     4 Minute Liters of Oxygen 0 L     5 Minute Oxygen Saturation % 92 %     5 Minute Liters of Oxygen 0 L     6 Minute Oxygen Saturation % 91 %     6 Minute  Liters of Oxygen 0 L        Oxygen Initial Assessment:     Oxygen Initial Assessment - 10/03/17 1644      Initial 6 min Walk   Oxygen Used None     Program Oxygen  Prescription   Program Oxygen Prescription None      Oxygen Re-Evaluation:   Oxygen Discharge (Final Oxygen Re-Evaluation):   Initial Exercise Prescription:     Initial Exercise Prescription - 10/03/17 1600      Date of Initial Exercise RX and Referring Provider   Date 10/03/17   Referring Provider Dr. Melvyn Novas     Recumbant Bike   Level 2   Watts 20   Minutes 17     NuStep   Level 2   Minutes 17   METs 1.5     Track   Laps 10   Minutes 17     Prescription Details   Frequency (times per week) 2   Duration Progress to 45 minutes of aerobic exercise without signs/symptoms of physical distress     Intensity   THRR 40-80% of Max Heartrate 60-120   Ratings of Perceived Exertion 11-13   Perceived Dyspnea 0-4     Progression   Progression Continue progressive overload as per policy without signs/symptoms or physical distress.     Resistance Training   Training Prescription Yes   Weight blue bands   Reps 10-15      Perform Capillary Blood Glucose checks as needed.  Exercise Prescription Changes:   Exercise Comments:   Exercise Goals and Review:   Exercise Goals Re-Evaluation :   Discharge Exercise Prescription (Final Exercise Prescription Changes):   Nutrition:  Target Goals: Understanding of nutrition guidelines, daily intake of sodium 1500mg , cholesterol 200mg , calories 30% from fat and 7% or less from saturated fats, daily to have 5 or more servings of fruits and vegetables.  Biometrics:    Nutrition Therapy Plan and Nutrition Goals:   Nutrition Discharge: Rate Your Plate Scores:   Nutrition Goals Re-Evaluation:   Nutrition Goals Discharge (Final Nutrition Goals Re-Evaluation):   Psychosocial: Target Goals: Acknowledge presence or absence of significant depression and/or stress, maximize coping skills, provide positive support system. Participant is able to verbalize types and ability to use techniques and skills needed for reducing  stress and depression.  Initial Review & Psychosocial Screening:     Initial Psych Review & Screening - 09/27/17 1305      Initial Review   Current issues with None Identified     Family Dynamics   Good Support System? Yes     Barriers   Psychosocial barriers to participate in program There are no identifiable barriers or psychosocial needs.     Screening Interventions   Interventions Encouraged to exercise      Quality of Life Scores:   PHQ-9: Recent Review Flowsheet Data    Depression screen [facility] 2/9 09/27/2017 06/25/2017   Decreased Interest 0 0   Down, Depressed, Hopeless 0 0   PHQ - 2 Score 0 0     Interpretation of Total Score  Total Score Depression Severity:  1-4 = Minimal depression, 5-9 = Mild depression, 10-14 = Moderate depression, 15-19 = Moderately severe depression, 20-27 = Severe depression   Psychosocial Evaluation and Intervention:     Psychosocial Evaluation - 09/27/17 1305      Psychosocial Evaluation & Interventions   Interventions Encouraged to exercise with the program and follow exercise prescription   Expected Outcomes patient will  remain free from psychosocial barriers to participation in pulmonary rehab   Continue Psychosocial Services  No Follow up required      Psychosocial Re-Evaluation:   Psychosocial Discharge (Final Psychosocial Re-Evaluation):   Education: Education Goals: Education classes will be provided on a weekly basis, covering required topics. Participant will state understanding/return demonstration of topics presented.  Learning Barriers/Preferences:   Education Topics: Risk Factor Reduction:  -Group instruction that is supported by a PowerPoint presentation. Instructor discusses the definition of a risk factor, different risk factors for pulmonary disease, and how the heart and lungs work together.     Nutrition for Pulmonary Patient:  -Group instruction provided by PowerPoint slides, verbal discussion, and  written materials to support subject matter. The instructor gives an explanation and review of healthy diet recommendations, which includes a discussion on weight management, recommendations for fruit and vegetable consumption, as well as protein, fluid, caffeine, fiber, sodium, sugar, and alcohol. Tips for eating when patients are short of breath are discussed.   Pursed Lip Breathing:  -Group instruction that is supported by demonstration and informational handouts. Instructor discusses the benefits of pursed lip and diaphragmatic breathing and detailed demonstration on how to preform both.     Oxygen Safety:  -Group instruction provided by PowerPoint, verbal discussion, and written material to support subject matter. There is an overview of "What is Oxygen" and "Why do we need it".  Instructor also reviews how to create a safe environment for oxygen use, the importance of using oxygen as prescribed, and the risks of noncompliance. There is a brief discussion on traveling with oxygen and resources the patient may utilize.   Oxygen Equipment:  -Group instruction provided by [facility] Staff utilizing handouts, written materials, and equipment demonstrations.   Signs and Symptoms:  -Group instruction provided by written material and verbal discussion to support subject matter. Warning signs and symptoms of infection, stroke, and heart attack are reviewed and when to call the physician/911 reinforced. Tips for preventing the spread of infection discussed.   Advanced Directives:  -Group instruction provided by verbal instruction and written material to support subject matter. Instructor reviews Advanced Directive laws and proper instruction for filling out document.   Pulmonary Video:  -Group video education that reviews the importance of medication and oxygen compliance, exercise, good nutrition, pulmonary hygiene, and pursed lip and diaphragmatic breathing for the pulmonary  patient.   Exercise for the Pulmonary Patient:  -Group instruction that is supported by a PowerPoint presentation. Instructor discusses benefits of exercise, core components of exercise, frequency, duration, and intensity of an exercise routine, importance of utilizing pulse oximetry during exercise, safety while exercising, and options of places to exercise outside of rehab.     Pulmonary Medications:  -Verbally interactive group education provided by instructor with focus on inhaled medications and proper administration.   Anatomy and Physiology of the Respiratory System and Intimacy:  -Group instruction provided by PowerPoint, verbal discussion, and written material to support subject matter. Instructor reviews respiratory cycle and anatomical components of the respiratory system and their functions. Instructor also reviews differences in obstructive and restrictive respiratory diseases with examples of each. Intimacy, Sex, and Sexuality differences are reviewed with a discussion on how relationships can change when diagnosed with pulmonary disease. Common sexual concerns are reviewed.   MD DAY -A group question and answer session with a medical doctor that allows participants to ask questions that relate to their pulmonary disease state.   OTHER EDUCATION -Group or individual verbal, written, or  video instructions that support the educational goals of the pulmonary rehab program.   Knowledge Questionnaire Score:   Core Components/Risk Factors/Patient Goals at Admission:     Personal Goals and Risk Factors at Admission - 09/27/17 1304      Core Components/Risk Factors/Patient Goals on Admission    Weight Management Yes;Obesity   Intervention Weight Management: Develop a combined nutrition and exercise program designed to reach desired caloric intake, while maintaining appropriate intake of nutrient and fiber, sodium and fats, and appropriate energy expenditure required for the  weight goal.;Obesity: Provide education and appropriate resources to help participant work on and attain dietary goals.;Weight Management: Provide education and appropriate resources to help participant work on and attain dietary goals.;Weight Management/Obesity: Establish reasonable short term and long term weight goals.   Expected Outcomes Short Term: Continue to assess and modify interventions until short term weight is achieved;Long Term: Adherence to nutrition and physical activity/exercise program aimed toward attainment of established weight goal;Weight Gain: Understanding of general recommendations for a high calorie, high protein meal plan that promotes weight gain by distributing calorie intake throughout the day with the consumption for 4-5 meals, snacks, and/or supplements;Understanding of distribution of calorie intake throughout the day with the consumption of 4-5 meals/snacks;Understanding recommendations for meals to include 15-35% energy as protein, 25-35% energy from fat, 35-60% energy from carbohydrates, less than 200mg  of dietary cholesterol, 20-35 gm of total fiber daily;Weight Loss: Understanding of general recommendations for a balanced deficit meal plan, which promotes 1-2 lb weight loss per week and includes a negative energy balance of [phone] kcal/d   Improve shortness of breath with ADL's Yes   Intervention Provide education, individualized exercise plan and daily activity instruction to help decrease symptoms of SOB with activities of daily living.   Expected Outcomes Short Term: Achieves a reduction of symptoms when performing activities of daily living.   Develop more efficient breathing techniques such as purse lipped breathing and diaphragmatic breathing; and practicing self-pacing with activity Yes   Intervention Provide education, demonstration and support about specific breathing techniuqes utilized for more efficient breathing. Include techniques such as pursed lipped  breathing, diaphragmatic breathing and self-pacing activity.   Expected Outcomes Short Term: Participant will be able to demonstrate and use breathing techniques as needed throughout daily activities.   Diabetes Yes   Intervention Provide education about signs/symptoms and action to take for hypo/hyperglycemia.   Expected Outcomes Short Term: Participant verbalizes understanding of the signs/symptoms and immediate care of hyper/hypoglycemia, proper foot care and importance of medication, aerobic/resistive exercise and nutrition plan for blood glucose control.;Long Term: Attainment of HbA1C < 7%.   Heart Failure Yes   Intervention Provide a combined exercise and nutrition program that is supplemented with education, support and counseling about heart failure. Directed toward relieving symptoms such as shortness of breath, decreased exercise tolerance, and extremity edema.   Expected Outcomes Improve functional capacity of life;Short term: Attendance in program 2-3 days a week with increased exercise capacity. Reported lower sodium intake. Reported increased fruit and vegetable intake. Reports medication compliance.;Short term: Daily weights obtained and reported for increase. Utilizing diuretic protocols set by physician.;Long term: Adoption of self-care skills and reduction of barriers for early signs and symptoms recognition and intervention leading to self-care maintenance.      Core Components/Risk Factors/Patient Goals Review:    Core Components/Risk Factors/Patient Goals at Discharge (Final Review):    ITP Comments:   Comments:

## 2017-10-08 ENCOUNTER — Encounter (HOSPITAL_COMMUNITY)
Admission: RE | Admit: 2017-10-08 | Discharge: 2017-10-08 | Disposition: A | Discharge status: Home / Self Care | Payer: Federal, State, Local not specified - PPO | Source: Ambulatory Visit | Attending: Internal Medicine | Admitting: Internal Medicine

## 2017-10-08 VITALS — Wt 248.9 lb

## 2017-10-08 DIAGNOSIS — J449 Chronic obstructive pulmonary disease, unspecified: Secondary | ICD-10-CM

## 2017-10-08 DIAGNOSIS — Z713 Dietary counseling and surveillance: Secondary | ICD-10-CM | POA: Diagnosis not present

## 2017-10-08 DIAGNOSIS — E119 Type 2 diabetes mellitus without complications: Secondary | ICD-10-CM | POA: Diagnosis not present

## 2017-10-08 LAB — GLUCOSE, CAPILLARY
GLUCOSE-CAPILLARY: 196 mg/dL — AB (ref 65–99)
GLUCOSE-CAPILLARY: 163 mg/dL — AB (ref 65–99)

## 2017-10-08 NOTE — Addendum Note (Signed)
Encounter addended by: Deon Pilling, RN on: 10/08/2017 3:17 PM  Actions taken: Visit Navigator Flowsheet section accepted

## 2017-10-08 NOTE — Progress Notes (Signed)
Daily Session Note  Patient Details  Name: Jeremiah [name] MRN: [id number] Date of Birth: 09/04/1946 Referring Provider:     Pulmonary Rehab Walk Test from 10/03/2017 in [place]  Referring Provider  Dr. Melvyn Novas      Encounter Date: 10/08/2017  Check In: Session Check In - 10/08/17 1213      Check-In   Location  [facility]    Staff Present  Rodney Langton, RN;Molly diVincenzo, MS, ACSM RCEP, Exercise Physiologist;Joan Leonia Reeves, RN, Luisa Hart, RN, BSN    Supervising physician immediately available to respond to emergencies  [facility] immediately available    Physician(s)  Dr. Broadus John    Medication changes reported      No    Fall or balance concerns reported     No    Tobacco Cessation  No Change    Warm-up and Cool-down  Performed as group-led instruction    Resistance Training Performed  Yes    VAD Patient?  No      Pain Assessment   Currently in Pain?  No/denies    Multiple Pain Sites  No       Capillary Blood Glucose: Results for orders placed or performed during the hospital encounter of 10/08/17 (from the past 24 hour(s))  Glucose, capillary     Status: Abnormal   Collection Time: 10/08/17 10:52 AM  Result Value Ref Range   Glucose-Capillary 196 (H) 65 - 99 mg/dL  Glucose, capillary     Status: Abnormal   Collection Time: 10/08/17 11:49 AM  Result Value Ref Range   Glucose-Capillary 163 (H) 65 - 99 mg/dL   POCT Glucose - 10/08/17 1228      POCT Blood Glucose   Pre-Exercise  196 mg/dL    Post-Exercise  163 mg/dL      Exercise Prescription Changes - 10/08/17 1200      Response to Exercise   Blood Pressure (Admit)  100/60    Blood Pressure (Exercise)  152/74    Blood Pressure (Exit)  126/62    Heart Rate (Admit)  64 bpm    Heart Rate (Exercise)  80 bpm    Heart Rate (Exit)  70 bpm    Oxygen Saturation (Admit)  93 %    Oxygen Saturation (Exercise)  93 %    Oxygen Saturation (Exit)  92 %    Rating of  Perceived Exertion (Exercise)  13    Perceived Dyspnea (Exercise)  0    Duration  Progress to 45 minutes of aerobic exercise without signs/symptoms of physical distress    Intensity  Other (comment) 40-80% of HRR   40-80% of HRR     Progression   Progression  Continue to progress workloads to maintain intensity without signs/symptoms of physical distress.      Resistance Training   Training Prescription  Yes    Weight  blue bands    Reps  10-15    Time  10 Minutes      Recumbant Bike   Level  2    Minutes  17      NuStep   Level  2    Minutes  17    METs  1.7      Track   Laps  13    Minutes  17       Social History   Tobacco Use  Smoking Status Former Smoker  . Packs/day: 0.50  . Years: 60.00  . Pack years: 30.00  .  Types: Cigarettes  . Last attempt to quit: 12/15/2016  . Years since quitting: 0.8  Smokeless Tobacco Never Used    Goals Met:  Exercise tolerated well No report of cardiac concerns or symptoms Strength training completed today  Goals Unmet:  Not Applicable  Comments: Service time is from 1030 to 1205    Dr. Rush Farmer is Medical Director for Pulmonary Rehab at [facility].

## 2017-10-10 ENCOUNTER — Encounter (HOSPITAL_COMMUNITY)
Admission: RE | Admit: 2017-10-10 | Discharge: 2017-10-10 | Disposition: A | Discharge status: Home / Self Care | Payer: Federal, State, Local not specified - PPO | Source: Ambulatory Visit | Attending: Internal Medicine | Admitting: Internal Medicine

## 2017-10-10 VITALS — Wt 250.0 lb

## 2017-10-10 DIAGNOSIS — Z713 Dietary counseling and surveillance: Secondary | ICD-10-CM | POA: Diagnosis not present

## 2017-10-10 DIAGNOSIS — J449 Chronic obstructive pulmonary disease, unspecified: Secondary | ICD-10-CM

## 2017-10-10 DIAGNOSIS — E119 Type 2 diabetes mellitus without complications: Secondary | ICD-10-CM | POA: Diagnosis not present

## 2017-10-10 NOTE — Progress Notes (Signed)
Daily Session Note  Patient Details  Name: Jeremiah [name] MRN: [id number] Date of Birth: [birth year] Referring Provider:     Pulmonary Rehab Walk Test from 10/03/2017 in [place]  Referring Provider  Dr. Melvyn Novas      Encounter Date: 10/10/2017  Check In: Session Check In - 10/10/17 1030      Check-In   Location  [facility]    Staff Present  Rodney Langton, RN;Estelene Carmack, MS, ACSM RCEP, Exercise Physiologist;Joan Leonia Reeves, RN, Luisa Hart, RN, BSN    Supervising physician immediately available to respond to emergencies  [facility] immediately available    Physician(s)  Dr. Quincy Simmonds    Medication changes reported      No    Fall or balance concerns reported     No    Tobacco Cessation  No Change    Warm-up and Cool-down  Performed as group-led instruction    Resistance Training Performed  Yes    VAD Patient?  No      Pain Assessment   Currently in Pain?  No/denies    Multiple Pain Sites  No       Capillary Blood Glucose: No results found for this or any previous visit (from the past 24 hour(s)). POCT Glucose - 10/10/17 1315      POCT Blood Glucose   Pre-Exercise  117 mg/dL    Post-Exercise  117 mg/dL      Exercise Prescription Changes - 10/10/17 1300      Response to Exercise   Blood Pressure (Admit)  120/56    Blood Pressure (Exercise)  110/70    Blood Pressure (Exit)  114/76    Heart Rate (Admit)  78 bpm    Heart Rate (Exercise)  80 bpm    Heart Rate (Exit)  73 bpm    Oxygen Saturation (Admit)  95 %    Oxygen Saturation (Exercise)  93 %    Oxygen Saturation (Exit)  90 %    Rating of Perceived Exertion (Exercise)  13    Perceived Dyspnea (Exercise)  1    Duration  Progress to 45 minutes of aerobic exercise without signs/symptoms of physical distress    Intensity  Other (comment) 40-80% of HRR   40-80% of HRR     Progression   Progression  Continue to progress workloads to maintain intensity without  signs/symptoms of physical distress.      Resistance Training   Training Prescription  Yes    Weight  blue bands    Reps  10-15    Time  10 Minutes      NuStep   Level  2    Minutes  17    METs  1.7      Track   Laps  13    Minutes  17       Social History   Tobacco Use  Smoking Status Former Smoker  . Packs/day: 0.50  . Years: 60.00  . Pack years: 30.00  . Types: Cigarettes  . Last attempt to quit: 12/15/2016  . Years since quitting: 0.8  Smokeless Tobacco Never Used    Goals Met:  Exercise tolerated well No report of cardiac concerns or symptoms Strength training completed today  Goals Unmet:  Not Applicable  Comments: Service time is from 10:30a to 12:30p    Dr. Rush Farmer is Medical Director for Pulmonary Rehab at [facility].

## 2017-10-15 ENCOUNTER — Encounter (HOSPITAL_COMMUNITY)
Admission: RE | Admit: 2017-10-15 | Discharge: 2017-10-15 | Disposition: A | Discharge status: Home / Self Care | Payer: Federal, State, Local not specified - PPO | Source: Ambulatory Visit | Attending: Internal Medicine | Admitting: Internal Medicine

## 2017-10-15 VITALS — Wt 249.8 lb

## 2017-10-15 DIAGNOSIS — J449 Chronic obstructive pulmonary disease, unspecified: Secondary | ICD-10-CM

## 2017-10-15 DIAGNOSIS — Z713 Dietary counseling and surveillance: Secondary | ICD-10-CM | POA: Diagnosis not present

## 2017-10-15 DIAGNOSIS — E119 Type 2 diabetes mellitus without complications: Secondary | ICD-10-CM | POA: Diagnosis not present

## 2017-10-15 NOTE — Progress Notes (Signed)
Daily Session Note  Patient Details  Name: Jeremiah [name] MRN: [id number] Date of Birth: [birth year] Referring Provider:     Pulmonary Rehab Walk Test from 10/03/2017 in [place]  Referring Provider  Dr. Melvyn Novas      Encounter Date: 10/15/2017  Check In: Session Check In - 10/15/17 1241      Check-In   Location  [facility]    Staff Present  Trish Fountain, RN, BSN;Shaelynn Dragos Ysidro Evert, RN;Molly diVincenzo, MS, ACSM RCEP, Exercise Physiologist;Joan Leonia Reeves, RN, BSN    Supervising physician immediately available to respond to emergencies  [facility] immediately available    Physician(s)  Dr. Maylene Roes    Medication changes reported      No    Fall or balance concerns reported     No    Tobacco Cessation  No Change    Warm-up and Cool-down  Performed as group-led instruction    Resistance Training Performed  Yes    VAD Patient?  No      Pain Assessment   Currently in Pain?  No/denies    Multiple Pain Sites  No       Capillary Blood Glucose: No results found for this or any previous visit (from the past 24 hour(s)). POCT Glucose - 10/15/17 1255      POCT Blood Glucose   Pre-Exercise  205 mg/dL    Post-Exercise  153 mg/dL      Exercise Prescription Changes - 10/15/17 1200      Response to Exercise   Blood Pressure (Admit)  110/60    Blood Pressure (Exercise)  140/50    Blood Pressure (Exit)  102/70    Heart Rate (Admit)  72 bpm    Heart Rate (Exercise)  87 bpm    Heart Rate (Exit)  64 bpm    Oxygen Saturation (Admit)  92 %    Oxygen Saturation (Exercise)  94 %    Oxygen Saturation (Exit)  92 %    Rating of Perceived Exertion (Exercise)  13    Perceived Dyspnea (Exercise)  0    Duration  Progress to 45 minutes of aerobic exercise without signs/symptoms of physical distress    Intensity  THRR unchanged      Progression   Progression  Continue to progress workloads to maintain intensity without signs/symptoms of physical distress.       Resistance Training   Training Prescription  Yes    Weight  blue bands    Reps  10-15    Time  10 Minutes      Recumbant Bike   Level  1    Minutes  17      NuStep   Level  3    Minutes  17    METs  1.7      Track   Laps  13    Minutes  17       Social History   Tobacco Use  Smoking Status Former Smoker  . Packs/day: 0.50  . Years: 60.00  . Pack years: 30.00  . Types: Cigarettes  . Last attempt to quit: 12/15/2016  . Years since quitting: 0.8  Smokeless Tobacco Never Used    Goals Met:  No report of cardiac concerns or symptoms Strength training completed today  Goals Unmet:  Not Applicable  Comments: Service time is from 1030 to 1215    Dr. Rush Farmer is Medical Director for Pulmonary Rehab at [facility].

## 2017-10-17 ENCOUNTER — Encounter (HOSPITAL_COMMUNITY)
Admission: RE | Admit: 2017-10-17 | Discharge: 2017-10-17 | Disposition: A | Discharge status: Home / Self Care | Payer: Federal, State, Local not specified - PPO | Source: Ambulatory Visit | Attending: Family Medicine | Admitting: Family Medicine

## 2017-10-17 VITALS — Wt 248.7 lb

## 2017-10-17 DIAGNOSIS — E119 Type 2 diabetes mellitus without complications: Secondary | ICD-10-CM | POA: Diagnosis not present

## 2017-10-17 DIAGNOSIS — J449 Chronic obstructive pulmonary disease, unspecified: Secondary | ICD-10-CM

## 2017-10-17 DIAGNOSIS — Z713 Dietary counseling and surveillance: Secondary | ICD-10-CM | POA: Diagnosis not present

## 2017-10-17 LAB — GLUCOSE, CAPILLARY: GLUCOSE-CAPILLARY: 118 mg/dL — AB (ref 65–99)

## 2017-10-17 NOTE — Progress Notes (Signed)
Daily Session Note  Patient Details  Name: Treshun Wold MRN: [id number] Date of Birth: 1946/11/07 Referring Provider:     Pulmonary Rehab Walk Test from 10/03/2017 in [place]  Referring Provider  Dr. Melvyn Novas      Encounter Date: 10/17/2017  Check In: Session Check In - 10/17/17 1030      Check-In   Location  [facility]    Staff Present  Rosebud Poles, RN, BSN;Molly diVincenzo, MS, ACSM RCEP, Exercise Physiologist;Lisa Ysidro Evert, RN;Portia Rollene Rotunda, RN, BSN    Supervising physician immediately available to respond to emergencies  [facility] immediately available    Physician(s)  Dr. Broadus John    Medication changes reported      No    Fall or balance concerns reported     No    Tobacco Cessation  No Change    Warm-up and Cool-down  Performed as group-led instruction    Resistance Training Performed  Yes    VAD Patient?  No      Pain Assessment   Currently in Pain?  No/denies    Multiple Pain Sites  No       Capillary Blood Glucose: Results for orders placed or performed during the hospital encounter of 10/17/17 (from the past 24 hour(s))  Glucose, capillary     Status: Abnormal   Collection Time: 10/17/17 12:26 PM  Result Value Ref Range   Glucose-Capillary 118 (H) 65 - 99 mg/dL    Exercise Prescription Changes - 10/17/17 1200      Response to Exercise   Blood Pressure (Admit)  110/58    Blood Pressure (Exercise)  120/60    Blood Pressure (Exit)  106/58    Heart Rate (Admit)  76 bpm    Heart Rate (Exercise)  87 bpm    Heart Rate (Exit)  72 bpm    Oxygen Saturation (Admit)  93 %    Oxygen Saturation (Exercise)  94 %    Oxygen Saturation (Exit)  93 %    Rating of Perceived Exertion (Exercise)  13    Perceived Dyspnea (Exercise)  0    Duration  Continue with 45 min of aerobic exercise without signs/symptoms of physical distress.      Progression   Progression  Continue to progress workloads to maintain intensity without  signs/symptoms of physical distress.      Resistance Training   Training Prescription  Yes    Weight  blue bands    Reps  10-15    Time  10 Minutes      Interval Training   Interval Training  No      Recumbant Bike   Level  2    Minutes  17      Track   Laps  10    Minutes  17       Social History   Tobacco Use  Smoking Status Former Smoker  . Packs/day: 0.50  . Years: 60.00  . Pack years: 30.00  . Types: Cigarettes  . Last attempt to quit: 12/15/2016  . Years since quitting: 0.8  Smokeless Tobacco Never Used    Goals Met:  Exercise tolerated well Strength training completed today  Goals Unmet:  Not Applicable  Comments: Service time is from 1030 to 1225    Dr. Rush Farmer is Medical Director for Pulmonary Rehab at [facility].

## 2017-10-17 NOTE — Progress Notes (Signed)
Pulmonary Individual Treatment Plan  Patient Details  Name: Jeremiah [name] MRN: [id number] Date of Birth: 1946-08-06 Referring Provider:     Pulmonary Rehab Walk Test from 10/03/2017 in [place]  Referring Provider  Dr. Melvyn Novas      Initial Encounter Date:    Pulmonary Rehab Walk Test from 10/03/2017 in [place]  Date  10/03/17  Referring Provider  Dr. Melvyn Novas      Visit Diagnosis: Chronic obstructive pulmonary disease, unspecified COPD type ([place])  Patient's Home Medications on Admission:   Current Outpatient Medications:  .  aspirin 81 MG tablet, Take 1 tablet (81 mg total) by mouth daily. Hold for 5 days (Patient taking differently: Take 81 mg by mouth daily. ), Disp: 30 tablet, Rfl:  .  atorvastatin (LIPITOR) 80 MG tablet, Take 80 mg by mouth daily., Disp: , Rfl:  .  betamethasone dipropionate 0.05 % lotion, Apply topically daily. Apply to affected area daily, Disp: , Rfl:  .  bisoprolol (ZEBETA) 5 MG tablet, Take 1 tablet (5 mg total) by mouth daily., Disp: 60 tablet, Rfl: 11 .  furosemide (LASIX) 40 MG tablet, Take 1 tablet (40 mg total) by mouth daily., Disp: 90 tablet, Rfl: 3 .  glimepiride (AMARYL) 1 MG tablet, Take 1 tablet by mouth daily., Disp: , Rfl:  .  ipratropium-albuterol (DUONEB) 0.5-2.5 (3) MG/3ML SOLN, Take 3 mLs by nebulization every 4 (four) hours as needed., Disp: 360 mL, Rfl: 0 .  IRON PO, Take 1 tablet by mouth daily., Disp: , Rfl:  .  ketoconazole (NIZORAL) 2 % shampoo, AS DIRECTED, Disp: , Rfl:  .  levothyroxine (SYNTHROID, LEVOTHROID) 75 MCG tablet, Take 75 mcg by mouth daily before breakfast., Disp: , Rfl:  .  metFORMIN (GLUCOPHAGE) 1000 MG tablet, Take 1,000 mg by mouth daily with breakfast., Disp: , Rfl:  .  OXYGEN, 2.5 lpm with sleep only AHC, Disp: , Rfl:  .  pantoprazole (PROTONIX) 40 MG tablet, Take 1 tablet (40 mg total) by mouth 2 (two) times daily before a meal., Disp: 60 tablet, Rfl: 2 .   Turmeric 500 MG TABS, Take 500 mg by mouth daily., Disp: , Rfl:   Past Medical History: Past Medical History:  Diagnosis Date  . A-fib ([place])   . CAD (coronary artery disease)    a. 2006: CABG in 2006 with LIMA-LAD, SVG-OM1, and SVG-RPDA  . Chronic pain 03/21/2016  . Essential hypertension 03/21/2016  . History of prostate cancer 03/21/2016  . HLD (hyperlipidemia)   . HTN (hypertension)   . Hx of CABG 2006  . Hypercholesteremia 03/21/2016  . Hypothyroidism   . Medication management 03/21/2016  . Morbid obesity ([place]) 03/21/2016  . Myocardial infarct ([place])   . OSA (obstructive sleep apnea) 03/21/2016  . Pain in right ankle and joints of right foot 03/21/2016  . Paronychia 03/21/2016  . Primary insomnia 03/21/2016  . Prostate cancer ([place]) 2008  . Tobacco dependence 03/21/2016    Tobacco Use: Social History   Tobacco Use  Smoking Status Former Smoker  . Packs/day: 0.50  . Years: 60.00  . Pack years: 30.00  . Types: Cigarettes  . Last attempt to quit: 12/15/2016  . Years since quitting: 0.8  Smokeless Tobacco Never Used    Labs: Recent Review Scientist, physiological    Labs for ITP Cardiac and Pulmonary Rehab Latest Ref Rng & Units 05/05/2016 02/25/2017 02/25/2017   HCO3 20.0 - 28.0 mmol/L - 36.1(H) -   TCO2  0 - 100 mmol/L 26 38 33   O2SAT % - 86.0 -      Capillary Blood Glucose: Lab Results  Component Value Date   GLUCAP 118 (H) 10/17/2017   GLUCAP 163 (H) 10/08/2017   GLUCAP 196 (H) 10/08/2017   GLUCAP 115 (H) 05/08/2016   GLUCAP 160 (H) 05/08/2016   POCT Glucose    Row Name 10/08/17 1228 10/10/17 1315 10/15/17 1255         POCT Blood Glucose   Pre-Exercise  196 mg/dL  117 mg/dL  205 mg/dL     Post-Exercise  163 mg/dL  117 mg/dL  153 mg/dL        Pulmonary Assessment Scores: Pulmonary Assessment Scores    Row Name 10/03/17 1644 10/08/17 1514       ADL UCSD   ADL Phase  Entry  Entry    SOB Score total  -  34      CAT Score   CAT Score  -  16 Entry      mMRC Score    mMRC Score  2  -       Pulmonary Function Assessment: Pulmonary Function Assessment - 09/27/17 1304      Breath   Bilateral Breath Sounds  Clear;Decreased    Shortness of Breath  Yes;Limiting activity       Exercise Target Goals:    Exercise Program Goal: Individual exercise prescription set with THRR, safety & activity barriers. Participant demonstrates ability to understand and report RPE using BORG scale, to self-measure pulse accurately, and to acknowledge the importance of the exercise prescription.  Exercise Prescription Goal: Starting with aerobic activity 30 plus minutes a day, 3 days per week for initial exercise prescription. Provide home exercise prescription and guidelines that participant acknowledges understanding prior to discharge.  Activity Barriers & Risk Stratification: Activity Barriers & Cardiac Risk Stratification - 09/27/17 1238      Activity Barriers & Cardiac Risk Stratification   Activity Barriers  Shortness of Breath;Deconditioning;Joint Problems       6 Minute Walk: 6 Minute Walk    Row Name 10/03/17 1639         6 Minute Walk   Phase  Initial     Distance  1000 feet     Walk Time  6 minutes     # of Rest Breaks  0     MPH  1.89     METS  2.45     RPE  13     Perceived Dyspnea   1     Symptoms  Yes (comment)     Comments  7/10 ankle pain     Resting HR  93 bpm     Resting BP  144/70     Resting Oxygen Saturation   93 %     Exercise Oxygen Saturation  during 6 min walk  89 %     Max Ex. HR  116 bpm     Max Ex. BP  160/72       Interval HR   1 Minute HR  94     2 Minute HR  94     3 Minute HR  104     4 Minute HR  112     5 Minute HR  116     6 Minute HR  127     Interval Heart Rate?  Yes       Interval Oxygen   Interval Oxygen?  Yes  Baseline Oxygen Saturation %  93 %     1 Minute Oxygen Saturation %  92 %     1 Minute Liters of Oxygen  0 L     2 Minute Oxygen Saturation %  89 %     2 Minute Liters of Oxygen  0 L      3 Minute Oxygen Saturation %  91 %     3 Minute Liters of Oxygen  0 L     4 Minute Oxygen Saturation %  90 %     4 Minute Liters of Oxygen  0 L     5 Minute Oxygen Saturation %  92 %     5 Minute Liters of Oxygen  0 L     6 Minute Oxygen Saturation %  91 %     6 Minute Liters of Oxygen  0 L        Oxygen Initial Assessment: Oxygen Initial Assessment - 10/03/17 1644      Initial 6 min Walk   Oxygen Used  None      Program Oxygen Prescription   Program Oxygen Prescription  None       Oxygen Re-Evaluation: Oxygen Re-Evaluation    Row Name 10/14/17 1628             Program Oxygen Prescription   Program Oxygen Prescription  None         Home Oxygen   Home Oxygen Device  Home Concentrator;E-Tanks       Sleep Oxygen Prescription  CPAP;Continuous       Liters per minute  2.5       Home Exercise Oxygen Prescription  None       Liters per minute  0       Home at Rest Exercise Oxygen Prescription  None       Compliance with Home Oxygen Use  No         Goals/Expected Outcomes   Short Term Goals  To learn and exhibit compliance with exercise, home and travel O2 prescription;To learn and understand importance of monitoring SPO2 with pulse oximeter and demonstrate accurate use of the pulse oximeter.;To learn and understand importance of maintaining oxygen saturations>88%;To learn and demonstrate proper pursed lip breathing techniques or other breathing techniques.;To learn and demonstrate proper use of respiratory medications       Long  Term Goals  Exhibits compliance with exercise, home and travel O2 prescription;Maintenance of O2 saturations>88%;Compliance with respiratory medication;Verbalizes importance of monitoring SPO2 with pulse oximeter and return demonstration;Exhibits proper breathing techniques, such as pursed lip breathing or other method taught during program session       Comments  patient does not require oxygen during exercise and able to maintain saturations >93%  with exertion.          Oxygen Discharge (Final Oxygen Re-Evaluation): Oxygen Re-Evaluation - 10/14/17 1628      Program Oxygen Prescription   Program Oxygen Prescription  None      Home Oxygen   Home Oxygen Device  Home Concentrator;E-Tanks    Sleep Oxygen Prescription  CPAP;Continuous    Liters per minute  2.5    Home Exercise Oxygen Prescription  None    Liters per minute  0    Home at Rest Exercise Oxygen Prescription  None    Compliance with Home Oxygen Use  No      Goals/Expected Outcomes   Short Term Goals  To learn and exhibit compliance with exercise, home  and travel O2 prescription;To learn and understand importance of monitoring SPO2 with pulse oximeter and demonstrate accurate use of the pulse oximeter.;To learn and understand importance of maintaining oxygen saturations>88%;To learn and demonstrate proper pursed lip breathing techniques or other breathing techniques.;To learn and demonstrate proper use of respiratory medications    Long  Term Goals  Exhibits compliance with exercise, home and travel O2 prescription;Maintenance of O2 saturations>88%;Compliance with respiratory medication;Verbalizes importance of monitoring SPO2 with pulse oximeter and return demonstration;Exhibits proper breathing techniques, such as pursed lip breathing or other method taught during program session    Comments  patient does not require oxygen during exercise and able to maintain saturations >93% with exertion.       Initial Exercise Prescription: Initial Exercise Prescription - 10/03/17 1600      Date of Initial Exercise RX and Referring Provider   Date  10/03/17    Referring Provider  Dr. Melvyn Novas      Recumbant Bike   Level  2    Watts  20    Minutes  17      NuStep   Level  2    Minutes  17    METs  1.5      Track   Laps  10    Minutes  17      Prescription Details   Frequency (times per week)  2    Duration  Progress to 45 minutes of aerobic exercise without  signs/symptoms of physical distress      Intensity   THRR 40-80% of Max Heartrate  60-120    Ratings of Perceived Exertion  11-13    Perceived Dyspnea  0-4      Progression   Progression  Continue progressive overload as per policy without signs/symptoms or physical distress.      Resistance Training   Training Prescription  Yes    Weight  blue bands    Reps  10-15       Perform Capillary Blood Glucose checks as needed.  Exercise Prescription Changes: Exercise Prescription Changes    Row Name 10/08/17 1200 10/10/17 1300 10/15/17 1200 10/17/17 1200       Response to Exercise   Blood Pressure (Admit)  100/60  120/56  110/60  110/58    Blood Pressure (Exercise)  152/74  110/70  140/50  120/60    Blood Pressure (Exit)  126/62  114/76  102/70  106/58    Heart Rate (Admit)  64 bpm  78 bpm  72 bpm  76 bpm    Heart Rate (Exercise)  80 bpm  80 bpm  87 bpm  87 bpm    Heart Rate (Exit)  70 bpm  73 bpm  64 bpm  72 bpm    Oxygen Saturation (Admit)  93 %  95 %  92 %  93 %    Oxygen Saturation (Exercise)  93 %  93 %  94 %  94 %    Oxygen Saturation (Exit)  92 %  90 %  92 %  93 %    Rating of Perceived Exertion (Exercise)  '13  13  13  13    ' Perceived Dyspnea (Exercise)  0  1  0  0    Duration  Progress to 45 minutes of aerobic exercise without signs/symptoms of physical distress  Progress to 45 minutes of aerobic exercise without signs/symptoms of physical distress  Progress to 45 minutes of aerobic exercise without signs/symptoms of physical distress  Continue  with 45 min of aerobic exercise without signs/symptoms of physical distress.    Intensity  Other (comment) 40-80% of HRR  Other (comment) 40-80% of HRR  THRR unchanged  -      Progression   Progression  Continue to progress workloads to maintain intensity without signs/symptoms of physical distress.  Continue to progress workloads to maintain intensity without signs/symptoms of physical distress.  Continue to progress workloads to  maintain intensity without signs/symptoms of physical distress.  Continue to progress workloads to maintain intensity without signs/symptoms of physical distress.      Resistance Training   Training Prescription  Yes  Yes  Yes  Yes    Weight  blue bands  blue bands  blue bands  blue bands    Reps  10-15  10-15  10-15  10-15    Time  10 Minutes  10 Minutes  10 Minutes  10 Minutes      Interval Training   Interval Training  -  -  -  No      Recumbant Bike   Level  2  -  1  2    Minutes  17  -  17  17      NuStep   Level  '2  2  3  ' -    Minutes  '17  17  17  ' -    METs  1.7  1.7  1.7  -      Track   Laps  '13  13  13  10    ' Minutes  '17  17  17  17       ' Exercise Comments:   Exercise Goals and Review:   Exercise Goals Re-Evaluation : Exercise Goals Re-Evaluation    Row Name 10/17/17 0943             Exercise Goal Re-Evaluation   Exercise Goals Review  Increase Physical Activity;Increase Strength and Stamina;Able to understand and use Dyspnea scale;Able to understand and use rate of perceived exertion (RPE) scale;Knowledge and understanding of Target Heart Rate Range (THRR);Understanding of Exercise Prescription       Comments  Patient has only completed three days of rehab. Will monitor and progress as able.        Expected Outcomes  Through exercise at home and at rehab, patient will increase strength stamina and be able to perform ADL's easier.           Discharge Exercise Prescription (Final Exercise Prescription Changes): Exercise Prescription Changes - 10/17/17 1200      Response to Exercise   Blood Pressure (Admit)  110/58    Blood Pressure (Exercise)  120/60    Blood Pressure (Exit)  106/58    Heart Rate (Admit)  76 bpm    Heart Rate (Exercise)  87 bpm    Heart Rate (Exit)  72 bpm    Oxygen Saturation (Admit)  93 %    Oxygen Saturation (Exercise)  94 %    Oxygen Saturation (Exit)  93 %    Rating of Perceived Exertion (Exercise)  13    Perceived Dyspnea  (Exercise)  0    Duration  Continue with 45 min of aerobic exercise without signs/symptoms of physical distress.      Progression   Progression  Continue to progress workloads to maintain intensity without signs/symptoms of physical distress.      Resistance Training   Training Prescription  Yes    Weight  blue bands  Reps  10-15    Time  10 Minutes      Interval Training   Interval Training  No      Recumbant Bike   Level  2    Minutes  17      Track   Laps  10    Minutes  17       Nutrition:  Target Goals: Understanding of nutrition guidelines, daily intake of sodium <1587m, cholesterol <206m calories 30% from fat and 7% or less from saturated fats, daily to have 5 or more servings of fruits and vegetables.  Biometrics:    Nutrition Therapy Plan and Nutrition Goals:   Nutrition Discharge: Rate Your Plate Scores:   Nutrition Goals Re-Evaluation:   Nutrition Goals Discharge (Final Nutrition Goals Re-Evaluation):   Psychosocial: Target Goals: Acknowledge presence or absence of significant depression and/or stress, maximize coping skills, provide positive support system. Participant is able to verbalize types and ability to use techniques and skills needed for reducing stress and depression.  Initial Review & Psychosocial Screening: Initial Psych Review & Screening - 09/27/17 1305      Initial Review   Current issues with  None Identified      Family Dynamics   Good Support System?  Yes      Barriers   Psychosocial barriers to participate in program  There are no identifiable barriers or psychosocial needs.      Screening Interventions   Interventions  Encouraged to exercise       Quality of Life Scores:   PHQ-9: Recent Review Flowsheet Data    [facility] 09[facility]27[facility]2017 06/25/2017   Decreased Interest 0 0   Down, Depressed, Hopeless 0 0   PHQ - 2 Score 0 0     Interpretation of Total Score  Total Score Depression Severity:  1-4  = Minimal depression, 5-9 = Mild depression, 10-14 = Moderate depression, 15-19 = Moderately severe depression, 20-27 = Severe depression   Psychosocial Evaluation and Intervention: Psychosocial Evaluation - 09/27/17 1305      Psychosocial Evaluation & Interventions   Interventions  Encouraged to exercise with the program and follow exercise prescription    Expected Outcomes  patient will remain free from psychosocial barriers to participation in pulmonary rehab    Continue Psychosocial Services   No Follow up required       Psychosocial Re-Evaluation: Psychosocial Re-Evaluation    [place] 10/14/17 1631             Psychosocial Re-Evaluation   Current issues with  None Identified       Expected Outcomes  patient will remain free from psychosocial barriers to exercise in pulmonary rehab       Continue Psychosocial Services   No Follow up required          Psychosocial Discharge (Final Psychosocial Re-Evaluation): Psychosocial Re-Evaluation - 10/14/17 1631      Psychosocial Re-Evaluation   Current issues with  None Identified    Expected Outcomes  patient will remain free from psychosocial barriers to exercise in pulmonary rehab    Continue Psychosocial Services   No Follow up required       Education: Education Goals: Education classes will be provided on a weekly basis, covering required topics. Participant will state understanding/return demonstration of topics presented.  Learning Barriers/Preferences:   Education Topics: Risk Factor Reduction:  -Group instruction that is supported by a PowerPoint presentation. Instructor discusses the definition of a risk factor, different risk factors  for pulmonary disease, and how the heart and lungs work together.     Nutrition for Pulmonary Patient:  -Group instruction provided by PowerPoint slides, verbal discussion, and written materials to support subject matter. The instructor gives an explanation and review of healthy  diet recommendations, which includes a discussion on weight management, recommendations for fruit and vegetable consumption, as well as protein, fluid, caffeine, fiber, sodium, sugar, and alcohol. Tips for eating when patients are short of breath are discussed.   Pursed Lip Breathing:  -Group instruction that is supported by demonstration and informational handouts. Instructor discusses the benefits of pursed lip and diaphragmatic breathing and detailed demonstration on how to preform both.     Oxygen Safety:  -Group instruction provided by PowerPoint, verbal discussion, and written material to support subject matter. There is an overview of "What is Oxygen" and "Why do we need it".  Instructor also reviews how to create a safe environment for oxygen use, the importance of using oxygen as prescribed, and the risks of noncompliance. There is a brief discussion on traveling with oxygen and resources the patient may utilize.   Oxygen Equipment:  -Group instruction provided by [facility] Staff utilizing handouts, written materials, and equipment demonstrations.   Signs and Symptoms:  -Group instruction provided by written material and verbal discussion to support subject matter. Warning signs and symptoms of infection, stroke, and heart attack are reviewed and when to call the physician/911 reinforced. Tips for preventing the spread of infection discussed.   Advanced Directives:  -Group instruction provided by verbal instruction and written material to support subject matter. Instructor reviews Advanced Directive laws and proper instruction for filling out document.   Pulmonary Video:  -Group video education that reviews the importance of medication and oxygen compliance, exercise, good nutrition, pulmonary hygiene, and pursed lip and diaphragmatic breathing for the pulmonary patient.   Exercise for the Pulmonary Patient:  -Group instruction that is supported by a PowerPoint presentation.  Instructor discusses benefits of exercise, core components of exercise, frequency, duration, and intensity of an exercise routine, importance of utilizing pulse oximetry during exercise, safety while exercising, and options of places to exercise outside of rehab.     PULMONARY REHAB CHRONIC OBSTRUCTIVE PULMONARY DISEASE from 10/17/2017 in [place]  Date  10/17/17  Educator  Cloyde Reams  Instruction Review Code  2- meets goals/outcomes      Pulmonary Medications:  -Verbally interactive group education provided by instructor with focus on inhaled medications and proper administration.   Anatomy and Physiology of the Respiratory System and Intimacy:  -Group instruction provided by PowerPoint, verbal discussion, and written material to support subject matter. Instructor reviews respiratory cycle and anatomical components of the respiratory system and their functions. Instructor also reviews differences in obstructive and restrictive respiratory diseases with examples of each. Intimacy, Sex, and Sexuality differences are reviewed with a discussion on how relationships can change when diagnosed with pulmonary disease. Common sexual concerns are reviewed.   MD DAY -A group question and answer session with a medical doctor that allows participants to ask questions that relate to their pulmonary disease state.   OTHER EDUCATION -Group or individual verbal, written, or video instructions that support the educational goals of the pulmonary rehab program.   PULMONARY REHAB CHRONIC OBSTRUCTIVE PULMONARY DISEASE from 10/17/2017 in [place]  Date  10/10/17 [DGUYQIH Eating]  Educator  RD  Instruction Review Code  1- Verbalizes Understanding      Knowledge Questionnaire  Score: Knowledge Questionnaire Score - 10/08/17 1514      Knowledge Questionnaire Score   Pre Score  10/13       Core Components/Risk Factors/Patient Goals at  Admission: Personal Goals and Risk Factors at Admission - 09/27/17 1304      Core Components/Risk Factors/Patient Goals on Admission    Weight Management  Yes;Obesity    Intervention  Weight Management: Develop a combined nutrition and exercise program designed to reach desired caloric intake, while maintaining appropriate intake of nutrient and fiber, sodium and fats, and appropriate energy expenditure required for the weight goal.;Obesity: Provide education and appropriate resources to help participant work on and attain dietary goals.;Weight Management: Provide education and appropriate resources to help participant work on and attain dietary goals.;Weight Management/Obesity: Establish reasonable short term and long term weight goals.    Expected Outcomes  Short Term: Continue to assess and modify interventions until short term weight is achieved;Long Term: Adherence to nutrition and physical activity/exercise program aimed toward attainment of established weight goal;Weight Gain: Understanding of general recommendations for a high calorie, high protein meal plan that promotes weight gain by distributing calorie intake throughout the day with the consumption for 4-5 meals, snacks, and/or supplements;Understanding of distribution of calorie intake throughout the day with the consumption of 4-5 meals/snacks;Understanding recommendations for meals to include 15-35% energy as protein, 25-35% energy from fat, 35-60% energy from carbohydrates, less than 263m of dietary cholesterol, 20-35 gm of total fiber daily;Weight Loss: Understanding of general recommendations for a balanced deficit meal plan, which promotes 1-2 lb weight loss per week and includes a negative energy balance of [phone] kcal/d    Improve shortness of breath with ADL's  Yes    Intervention  Provide education, individualized exercise plan and daily activity instruction to help decrease symptoms of SOB with activities of daily living.     Expected Outcomes  Short Term: Achieves a reduction of symptoms when performing activities of daily living.    Develop more efficient breathing techniques such as purse lipped breathing and diaphragmatic breathing; and practicing self-pacing with activity  Yes    Intervention  Provide education, demonstration and support about specific breathing techniuqes utilized for more efficient breathing. Include techniques such as pursed lipped breathing, diaphragmatic breathing and self-pacing activity.    Expected Outcomes  Short Term: Participant will be able to demonstrate and use breathing techniques as needed throughout daily activities.    Diabetes  Yes    Intervention  Provide education about signs/symptoms and action to take for hypo/hyperglycemia.    Expected Outcomes  Short Term: Participant verbalizes understanding of the signs/symptoms and immediate care of hyper/hypoglycemia, proper foot care and importance of medication, aerobic/resistive exercise and nutrition plan for blood glucose control.;Long Term: Attainment of HbA1C < 7%.    Heart Failure  Yes    Intervention  Provide a combined exercise and nutrition program that is supplemented with education, support and counseling about heart failure. Directed toward relieving symptoms such as shortness of breath, decreased exercise tolerance, and extremity edema.    Expected Outcomes  Improve functional capacity of life;Short term: Attendance in program 2-3 days a week with increased exercise capacity. Reported lower sodium intake. Reported increased fruit and vegetable intake. Reports medication compliance.;Short term: Daily weights obtained and reported for increase. Utilizing diuretic protocols set by physician.;Long term: Adoption of self-care skills and reduction of barriers for early signs and symptoms recognition and intervention leading to self-care maintenance.       Core Components/Risk Factors/Patient  Goals Review:  Goals and Risk Factor  Review    Row Name 10/14/17 1630             Core Components/Risk Factors/Patient Goals Review   Personal Goals Review  Weight Management/Obesity;Improve shortness of breath with ADL's;Develop more efficient breathing techniques such as purse lipped breathing and diaphragmatic breathing and practicing self-pacing with activity.;Diabetes;Heart Failure       Review  patient has only attended several session since admission and it is too soon to evaluate progression towards pulmonary rehab goals       Expected Outcomes  see admission outcomes          Core Components/Risk Factors/Patient Goals at Discharge (Final Review):  Goals and Risk Factor Review - 10/14/17 1630      Core Components/Risk Factors/Patient Goals Review   Personal Goals Review  Weight Management/Obesity;Improve shortness of breath with ADL's;Develop more efficient breathing techniques such as purse lipped breathing and diaphragmatic breathing and practicing self-pacing with activity.;Diabetes;Heart Failure    Review  patient has only attended several session since admission and it is too soon to evaluate progression towards pulmonary rehab goals    Expected Outcomes  see admission outcomes       ITP Comments:   Comments: ITP REVIEW Pt is making slight progress toward pulmonary rehab goals after completing 4 sessions. Recommend continued exercise, life style modification, education, and utilization of breathing techniques to increase stamina and strength and decrease shortness of breath with exertion.

## 2017-10-21 DIAGNOSIS — K080 Exfoliation of teeth due to systemic causes: Secondary | ICD-10-CM | POA: Diagnosis not present

## 2017-10-22 ENCOUNTER — Encounter (HOSPITAL_COMMUNITY): Payer: Federal, State, Local not specified - PPO

## 2017-10-29 ENCOUNTER — Encounter (HOSPITAL_COMMUNITY)
Admission: RE | Admit: 2017-10-29 | Discharge: 2017-10-29 | Disposition: A | Discharge status: Home / Self Care | Payer: Federal, State, Local not specified - PPO | Source: Ambulatory Visit | Attending: Internal Medicine | Admitting: Internal Medicine

## 2017-10-29 VITALS — Wt 247.1 lb

## 2017-10-29 DIAGNOSIS — Z713 Dietary counseling and surveillance: Secondary | ICD-10-CM | POA: Diagnosis not present

## 2017-10-29 DIAGNOSIS — J449 Chronic obstructive pulmonary disease, unspecified: Secondary | ICD-10-CM

## 2017-10-29 DIAGNOSIS — E119 Type 2 diabetes mellitus without complications: Secondary | ICD-10-CM | POA: Diagnosis not present

## 2017-10-29 LAB — GLUCOSE, CAPILLARY
GLUCOSE-CAPILLARY: 138 mg/dL — AB (ref 65–99)
Glucose-Capillary: 236 mg/dL — ABNORMAL HIGH (ref 65–99)

## 2017-10-29 NOTE — Progress Notes (Signed)
Daily Session Note  Patient Details  Name: Jeremiah [name] MRN: [id number] Date of Birth: 07-07-1946 Referring Provider:     Pulmonary Rehab Walk Test from 10/03/2017 in [place]  Referring Provider  Dr. Melvyn Novas      Encounter Date: 10/29/2017  Check In: Session Check In - 10/29/17 1030      Check-In   Location  [facility]    Staff Present  Rosebud Poles, RN, BSN;Molly diVincenzo, MS, ACSM RCEP, Exercise Physiologist;Lisa Ysidro Evert, RN;Dorene Bruni Rollene Rotunda, RN, BSN    Supervising physician immediately available to respond to emergencies  [facility] immediately available    Physician(s)  Dr. Maylene Roes    Medication changes reported      No    Fall or balance concerns reported     No    Tobacco Cessation  No Change    Warm-up and Cool-down  Performed as group-led instruction    Resistance Training Performed  Yes    VAD Patient?  No      Pain Assessment   Currently in Pain?  No/denies    Multiple Pain Sites  No       Capillary Blood Glucose: Results for orders placed or performed during the hospital encounter of 10/29/17 (from the past 24 hour(s))  Glucose, capillary     Status: Abnormal   Collection Time: 10/29/17 10:48 AM  Result Value Ref Range   Glucose-Capillary 236 (H) 65 - 99 mg/dL  Glucose, capillary     Status: Abnormal   Collection Time: 10/29/17 12:03 PM  Result Value Ref Range   Glucose-Capillary 138 (H) 65 - 99 mg/dL   POCT Glucose - 10/29/17 1238      POCT Blood Glucose   Pre-Exercise  236 mg/dL    Post-Exercise  138 mg/dL      Exercise Prescription Changes - 10/29/17 1237      Response to Exercise   Blood Pressure (Admit)  130/58    Blood Pressure (Exercise)  130/70    Blood Pressure (Exit)  130/74    Heart Rate (Admit)  70 bpm    Heart Rate (Exercise)  91 bpm    Heart Rate (Exit)  70 bpm    Oxygen Saturation (Admit)  95 %    Oxygen Saturation (Exercise)  92 %    Oxygen Saturation (Exit)  92 %    Rating of  Perceived Exertion (Exercise)  13    Perceived Dyspnea (Exercise)  1    Duration  Continue with 45 min of aerobic exercise without signs/symptoms of physical distress.      Progression   Progression  Continue to progress workloads to maintain intensity without signs/symptoms of physical distress.      Resistance Training   Training Prescription  Yes    Weight  blue bands    Reps  10-15    Time  10 Minutes      Interval Training   Interval Training  No      Recumbant Bike   Level  2.5    Minutes  17      NuStep   Level  4    Minutes  17      Track   Laps  13    Minutes  17       Social History   Tobacco Use  Smoking Status Former Smoker  . Packs/day: 0.50  . Years: 60.00  . Pack years: 30.00  . Types: Cigarettes  . Last  attempt to quit: 12/15/2016  . Years since quitting: 0.8  Smokeless Tobacco Never Used    Goals Met:  Improved SOB with ADL's Using PLB without cueing & demonstrates good technique Exercise tolerated well No report of cardiac concerns or symptoms Strength training completed today  Goals Unmet:  Not Applicable  Comments: Service time is from 1030 to 1215   Dr. Rush Farmer is Medical Director for Pulmonary Rehab at [facility].

## 2017-10-31 ENCOUNTER — Encounter (HOSPITAL_COMMUNITY)
Admission: RE | Admit: 2017-10-31 | Discharge: 2017-10-31 | Disposition: A | Discharge status: Home / Self Care | Payer: Federal, State, Local not specified - PPO | Source: Ambulatory Visit | Attending: Internal Medicine | Admitting: Internal Medicine

## 2017-10-31 DIAGNOSIS — J449 Chronic obstructive pulmonary disease, unspecified: Secondary | ICD-10-CM

## 2017-10-31 NOTE — Progress Notes (Signed)
Jeremiah [name] 71 y.o. male   DOB: Aug 18, 1946 MRN: [id number]          Nutrition 1. Chronic obstructive pulmonary disease, unspecified COPD type ([place])    Past Medical History:  Diagnosis Date  . A-fib ([place])   . CAD (coronary artery disease)    a. 2006: CABG in 2006 with LIMA-LAD, SVG-OM1, and SVG-RPDA  . Chronic pain 03/21/2016  . Essential hypertension 03/21/2016  . History of prostate cancer 03/21/2016  . HLD (hyperlipidemia)   . HTN (hypertension)   . Hx of CABG 2006  . Hypercholesteremia 03/21/2016  . Hypothyroidism   . Medication management 03/21/2016  . Morbid obesity ([place]) 03/21/2016  . Myocardial infarct ([place])   . OSA (obstructive sleep apnea) 03/21/2016  . Pain in right ankle and joints of right foot 03/21/2016  . Paronychia 03/21/2016  . Primary insomnia 03/21/2016  . Prostate cancer ([place]) 2008  . Tobacco dependence 03/21/2016   Meds reviewed. Glimepiride, Metformin noted  Ht: Ht Readings from Last 1 Encounters:  09/27/17 5\' 6"  (1.676 m)     Wt:  Wt Readings from Last 3 Encounters:  10/29/17 247 lb 2.2 oz (112.1 kg)  10/17/17 248 lb 10.9 oz (112.8 kg)  10/15/17 249 lb 12.5 oz (113.3 kg)     BMI: 40.4    Current tobacco use? No     Pt recently quit tobacco use 12/15/16  Labs:  Lipid Panel  No results found for: CHOL, TRIG, HDL, CHOLHDL, VLDL, LDLCALC, LDLDIRECT  No results found for: HGBA1C  Nutrition Diagnosis ? Food-and nutrition-related knowledge deficit related to lack of exposure to information as related to diagnosis of pulmonary disease ? Obesity related to excessive energy intake as evidenced by a BMI of 40.4  Goal(s) 1. Identify food quantities necessary to achieve wt loss of  -2# per week to a goal wt loss of 6-24 lb at graduation from pulmonary rehab. 2. CBG's in the normal range or as close to normal as is safely possible.  Plan:  Pt to attend Pulmonary Nutrition class Will provide client-centered nutrition education as part of interdisciplinary  care.   Monitor and evaluate progress toward nutrition goal with team.  Monitor and Evaluate progress toward nutrition goal with team.   Derek Mound, M.Ed, RD, LDN, CDE 10/31/2017 2:23 PM

## 2017-11-01 DIAGNOSIS — I5033 Acute on chronic diastolic (congestive) heart failure: Secondary | ICD-10-CM | POA: Diagnosis not present

## 2017-11-05 ENCOUNTER — Encounter (HOSPITAL_COMMUNITY)
Admission: RE | Admit: 2017-11-05 | Discharge: 2017-11-05 | Disposition: A | Discharge status: Home / Self Care | Payer: Federal, State, Local not specified - PPO | Source: Ambulatory Visit | Attending: Internal Medicine | Admitting: Internal Medicine

## 2017-11-05 VITALS — Wt 249.1 lb

## 2017-11-05 DIAGNOSIS — Z713 Dietary counseling and surveillance: Secondary | ICD-10-CM | POA: Insufficient documentation

## 2017-11-05 DIAGNOSIS — J449 Chronic obstructive pulmonary disease, unspecified: Secondary | ICD-10-CM

## 2017-11-05 DIAGNOSIS — E119 Type 2 diabetes mellitus without complications: Secondary | ICD-10-CM | POA: Insufficient documentation

## 2017-11-05 NOTE — Progress Notes (Signed)
Daily Session Note  Patient Details  Name: Jeremiah [name] MRN: [id number] Date of Birth: [birth year] Referring Provider:     Pulmonary Rehab Walk Test from 10/03/2017 in [place]  Referring Provider  Dr. Melvyn Novas      Encounter Date: 11/05/2017  Check In: Session Check In - 11/05/17 1217      Check-In   Location  [facility]    Staff Present  Rosebud Poles, RN, BSN;Molly diVincenzo, MS, ACSM RCEP, Exercise Physiologist;Johniya Durfee Ysidro Evert, RN;Portia Rollene Rotunda, RN, BSN    Supervising physician immediately available to respond to emergencies  [facility] immediately available    Physician(s)  Dr. [place] Crane    Medication changes reported      No    Fall or balance concerns reported     No    Tobacco Cessation  No Change    Warm-up and Cool-down  Performed as group-led instruction    Resistance Training Performed  Yes    VAD Patient?  No      Pain Assessment   Currently in Pain?  No/denies    Multiple Pain Sites  No       Capillary Blood Glucose: No results found for this or any previous visit (from the past 24 hour(s)). POCT Glucose - 11/05/17 1228      POCT Blood Glucose   Pre-Exercise  189 mg/dL    Post-Exercise  120 mg/dL      Exercise Prescription Changes - 11/05/17 1200      Response to Exercise   Blood Pressure (Admit)  108/56    Blood Pressure (Exercise)  138/60    Blood Pressure (Exit)  104/70    Heart Rate (Admit)  69 bpm    Heart Rate (Exercise)  89 bpm    Heart Rate (Exit)  75 bpm    Oxygen Saturation (Admit)  91 %    Oxygen Saturation (Exercise)  91 %    Oxygen Saturation (Exit)  92 %    Rating of Perceived Exertion (Exercise)  13    Perceived Dyspnea (Exercise)  3    Duration  Continue with 45 min of aerobic exercise without signs/symptoms of physical distress.    Intensity  THRR unchanged      Progression   Progression  Continue to progress workloads to maintain intensity without signs/symptoms of physical distress.       Resistance Training   Training Prescription  Yes    Weight  blue bands    Reps  10-15    Time  10 Minutes      Interval Training   Interval Training  No      Recumbant Bike   Level  3    Minutes  17      NuStep   Level  5    Minutes  17    METs  2.5      Track   Laps  11    Minutes  17       Social History   Tobacco Use  Smoking Status Former Smoker  . Packs/day: 0.50  . Years: 60.00  . Pack years: 30.00  . Types: Cigarettes  . Last attempt to quit: 12/15/2016  . Years since quitting: 0.8  Smokeless Tobacco Never Used    Goals Met:  Exercise tolerated well No report of cardiac concerns or symptoms Strength training completed today  Goals Unmet:  Not Applicable  Comments: Service time is from 1030 to 1205  Dr. Rush Farmer is Medical Director for Pulmonary Rehab at [facility].

## 2017-11-06 DIAGNOSIS — E119 Type 2 diabetes mellitus without complications: Secondary | ICD-10-CM | POA: Diagnosis not present

## 2017-11-06 DIAGNOSIS — E039 Hypothyroidism, unspecified: Secondary | ICD-10-CM | POA: Diagnosis not present

## 2017-11-06 DIAGNOSIS — Z79899 Other long term (current) drug therapy: Secondary | ICD-10-CM | POA: Diagnosis not present

## 2017-11-07 ENCOUNTER — Encounter (HOSPITAL_COMMUNITY)
Admission: RE | Admit: 2017-11-07 | Discharge: 2017-11-07 | Disposition: A | Discharge status: Home / Self Care | Payer: Federal, State, Local not specified - PPO | Source: Ambulatory Visit | Attending: Internal Medicine | Admitting: Internal Medicine

## 2017-11-07 VITALS — Wt 252.2 lb

## 2017-11-07 DIAGNOSIS — J449 Chronic obstructive pulmonary disease, unspecified: Secondary | ICD-10-CM

## 2017-11-07 DIAGNOSIS — E119 Type 2 diabetes mellitus without complications: Secondary | ICD-10-CM | POA: Diagnosis not present

## 2017-11-07 DIAGNOSIS — Z713 Dietary counseling and surveillance: Secondary | ICD-10-CM | POA: Diagnosis not present

## 2017-11-07 NOTE — Progress Notes (Signed)
Pulmonary Individual Treatment Plan  Patient Details  Name: Jeremiah [name] MRN: [id number] Date of Birth: [birth year] Referring Provider:     Pulmonary Rehab Walk Test from 10/03/2017 in [place]  Referring Provider  Dr. Melvyn Novas      Initial Encounter Date:    Pulmonary Rehab Walk Test from 10/03/2017 in [place]  Date  10/03/17  Referring Provider  Dr. Melvyn Novas      Visit Diagnosis: Chronic obstructive pulmonary disease, unspecified COPD type ([place])  Patient's Home Medications on Admission:   Current Outpatient Medications:  .  aspirin 81 MG tablet, Take 1 tablet (81 mg total) by mouth daily. Hold for 5 days (Patient taking differently: Take 81 mg by mouth daily. ), Disp: 30 tablet, Rfl:  .  atorvastatin (LIPITOR) 80 MG tablet, Take 80 mg by mouth daily., Disp: , Rfl:  .  betamethasone dipropionate 0.05 % lotion, Apply topically daily. Apply to affected area daily, Disp: , Rfl:  .  bisoprolol (ZEBETA) 5 MG tablet, Take 1 tablet (5 mg total) by mouth daily., Disp: 60 tablet, Rfl: 11 .  furosemide (LASIX) 40 MG tablet, Take 1 tablet (40 mg total) by mouth daily., Disp: 90 tablet, Rfl: 3 .  glimepiride (AMARYL) 1 MG tablet, Take 1 tablet by mouth daily., Disp: , Rfl:  .  ipratropium-albuterol (DUONEB) 0.5-2.5 (3) MG/3ML SOLN, Take 3 mLs by nebulization every 4 (four) hours as needed., Disp: 360 mL, Rfl: 0 .  IRON PO, Take 1 tablet by mouth daily., Disp: , Rfl:  .  ketoconazole (NIZORAL) 2 % shampoo, AS DIRECTED, Disp: , Rfl:  .  levothyroxine (SYNTHROID, LEVOTHROID) 75 MCG tablet, Take 75 mcg by mouth daily before breakfast., Disp: , Rfl:  .  metFORMIN (GLUCOPHAGE) 1000 MG tablet, Take 1,000 mg by mouth daily with breakfast., Disp: , Rfl:  .  OXYGEN, 2.5 lpm with sleep only AHC, Disp: , Rfl:  .  pantoprazole (PROTONIX) 40 MG tablet, Take 1 tablet (40 mg total) by mouth 2 (two) times daily before a meal., Disp: 60 tablet, Rfl: 2 .   Turmeric 500 MG TABS, Take 500 mg by mouth daily., Disp: , Rfl:   Past Medical History: Past Medical History:  Diagnosis Date  . A-fib ([place])   . CAD (coronary artery disease)    a. 2006: CABG in 2006 with LIMA-LAD, SVG-OM1, and SVG-RPDA  . Chronic pain 03/21/2016  . Essential hypertension 03/21/2016  . History of prostate cancer 03/21/2016  . HLD (hyperlipidemia)   . HTN (hypertension)   . Hx of CABG 2006  . Hypercholesteremia 03/21/2016  . Hypothyroidism   . Medication management 03/21/2016  . Morbid obesity ([place]) 03/21/2016  . Myocardial infarct ([place])   . OSA (obstructive sleep apnea) 03/21/2016  . Pain in right ankle and joints of right foot 03/21/2016  . Paronychia 03/21/2016  . Primary insomnia 03/21/2016  . Prostate cancer ([place]) 2008  . Tobacco dependence 03/21/2016    Tobacco Use: Social History   Tobacco Use  Smoking Status Former Smoker  . Packs/day: 0.50  . Years: 60.00  . Pack years: 30.00  . Types: Cigarettes  . Last attempt to quit: 12/15/2016  . Years since quitting: 0.8  Smokeless Tobacco Never Used    Labs: Recent Review Scientist, physiological    Labs for ITP Cardiac and Pulmonary Rehab Latest Ref Rng & Units 05/05/2016 02/25/2017 02/25/2017   HCO3 20.0 - 28.0 mmol/L - 36.1(H) -   TCO2  0 - 100 mmol/L 26 38 33   O2SAT % - 86.0 -      Capillary Blood Glucose: Lab Results  Component Value Date   GLUCAP 138 (H) 10/29/2017   GLUCAP 236 (H) 10/29/2017   GLUCAP 118 (H) 10/17/2017   GLUCAP 163 (H) 10/08/2017   GLUCAP 196 (H) 10/08/2017   POCT Glucose    Row Name 10/08/17 1228 10/10/17 1315 10/15/17 1255 10/29/17 1238 11/05/17 1228     POCT Blood Glucose   Pre-Exercise  196 mg/dL  117 mg/dL  205 mg/dL  236 mg/dL  189 mg/dL   Post-Exercise  163 mg/dL  117 mg/dL  153 mg/dL  138 mg/dL  120 mg/dL      Pulmonary Assessment Scores: Pulmonary Assessment Scores    Row Name 10/03/17 1644 10/08/17 1514       ADL UCSD   ADL Phase  Entry  Entry    SOB Score total  -  34       CAT Score   CAT Score  -  16 Entry      mMRC Score   mMRC Score  2  -       Pulmonary Function Assessment: Pulmonary Function Assessment - 09/27/17 1304      Breath   Bilateral Breath Sounds  Clear;Decreased    Shortness of Breath  Yes;Limiting activity       Exercise Target Goals:    Exercise Program Goal: Individual exercise prescription set with THRR, safety & activity barriers. Participant demonstrates ability to understand and report RPE using BORG scale, to self-measure pulse accurately, and to acknowledge the importance of the exercise prescription.  Exercise Prescription Goal: Starting with aerobic activity 30 plus minutes a day, 3 days per week for initial exercise prescription. Provide home exercise prescription and guidelines that participant acknowledges understanding prior to discharge.  Activity Barriers & Risk Stratification: Activity Barriers & Cardiac Risk Stratification - 09/27/17 1238      Activity Barriers & Cardiac Risk Stratification   Activity Barriers  Shortness of Breath;Deconditioning;Joint Problems       6 Minute Walk: 6 Minute Walk    Row Name 10/03/17 1639         6 Minute Walk   Phase  Initial     Distance  1000 feet     Walk Time  6 minutes     # of Rest Breaks  0     MPH  1.89     METS  2.45     RPE  13     Perceived Dyspnea   1     Symptoms  Yes (comment)     Comments  7/10 ankle pain     Resting HR  93 bpm     Resting BP  144/70     Resting Oxygen Saturation   93 %     Exercise Oxygen Saturation  during 6 min walk  89 %     Max Ex. HR  116 bpm     Max Ex. BP  160/72       Interval HR   1 Minute HR  94     2 Minute HR  94     3 Minute HR  104     4 Minute HR  112     5 Minute HR  116     6 Minute HR  127     Interval Heart Rate?  Yes       Interval  Oxygen   Interval Oxygen?  Yes     Baseline Oxygen Saturation %  93 %     1 Minute Oxygen Saturation %  92 %     1 Minute Liters of Oxygen  0 L     2 Minute  Oxygen Saturation %  89 %     2 Minute Liters of Oxygen  0 L     3 Minute Oxygen Saturation %  91 %     3 Minute Liters of Oxygen  0 L     4 Minute Oxygen Saturation %  90 %     4 Minute Liters of Oxygen  0 L     5 Minute Oxygen Saturation %  92 %     5 Minute Liters of Oxygen  0 L     6 Minute Oxygen Saturation %  91 %     6 Minute Liters of Oxygen  0 L        Oxygen Initial Assessment: Oxygen Initial Assessment - 10/03/17 1644      Initial 6 min Walk   Oxygen Used  None      Program Oxygen Prescription   Program Oxygen Prescription  None       Oxygen Re-Evaluation: Oxygen Re-Evaluation    Row Name 10/14/17 1628 11/04/17 1730           Program Oxygen Prescription   Program Oxygen Prescription  None  None        Home Oxygen   Home Oxygen Device  Home Concentrator;E-Tanks  Home Concentrator;E-Tanks      Sleep Oxygen Prescription  CPAP;Continuous  CPAP;Continuous      Liters per minute  2.5  2.5      Home Exercise Oxygen Prescription  None  None      Liters per minute  0  0      Home at Rest Exercise Oxygen Prescription  None  None      Compliance with Home Oxygen Use  No  No        Goals/Expected Outcomes   Short Term Goals  To learn and exhibit compliance with exercise, home and travel O2 prescription;To learn and understand importance of monitoring SPO2 with pulse oximeter and demonstrate accurate use of the pulse oximeter.;To learn and understand importance of maintaining oxygen saturations>88%;To learn and demonstrate proper pursed lip breathing techniques or other breathing techniques.;To learn and demonstrate proper use of respiratory medications  To learn and exhibit compliance with exercise, home and travel O2 prescription;To learn and understand importance of monitoring SPO2 with pulse oximeter and demonstrate accurate use of the pulse oximeter.;To learn and understand importance of maintaining oxygen saturations>88%;To learn and demonstrate proper pursed lip  breathing techniques or other breathing techniques.;To learn and demonstrate proper use of respiratory medications      Long  Term Goals  Exhibits compliance with exercise, home and travel O2 prescription;Maintenance of O2 saturations>88%;Compliance with respiratory medication;Verbalizes importance of monitoring SPO2 with pulse oximeter and return demonstration;Exhibits proper breathing techniques, such as pursed lip breathing or other method taught during program session  Exhibits compliance with exercise, home and travel O2 prescription;Maintenance of O2 saturations>88%;Compliance with respiratory medication;Verbalizes importance of monitoring SPO2 with pulse oximeter and return demonstration;Exhibits proper breathing techniques, such as pursed lip breathing or other method taught during program session      Comments  patient does not require oxygen during exercise and able to maintain saturations >93% with exertion.  patient does not require oxygen  during exercise and able to maintain saturations >93% with exertion.         Oxygen Discharge (Final Oxygen Re-Evaluation): Oxygen Re-Evaluation - 11/04/17 1730      Program Oxygen Prescription   Program Oxygen Prescription  None      Home Oxygen   Home Oxygen Device  Home Concentrator;E-Tanks    Sleep Oxygen Prescription  CPAP;Continuous    Liters per minute  2.5    Home Exercise Oxygen Prescription  None    Liters per minute  0    Home at Rest Exercise Oxygen Prescription  None    Compliance with Home Oxygen Use  No      Goals/Expected Outcomes   Short Term Goals  To learn and exhibit compliance with exercise, home and travel O2 prescription;To learn and understand importance of monitoring SPO2 with pulse oximeter and demonstrate accurate use of the pulse oximeter.;To learn and understand importance of maintaining oxygen saturations>88%;To learn and demonstrate proper pursed lip breathing techniques or other breathing techniques.;To learn  and demonstrate proper use of respiratory medications    Long  Term Goals  Exhibits compliance with exercise, home and travel O2 prescription;Maintenance of O2 saturations>88%;Compliance with respiratory medication;Verbalizes importance of monitoring SPO2 with pulse oximeter and return demonstration;Exhibits proper breathing techniques, such as pursed lip breathing or other method taught during program session    Comments  patient does not require oxygen during exercise and able to maintain saturations >93% with exertion.       Initial Exercise Prescription: Initial Exercise Prescription - 10/03/17 1600      Date of Initial Exercise RX and Referring Provider   Date  10/03/17    Referring Provider  Dr. Melvyn Novas      Recumbant Bike   Level  2    Watts  20    Minutes  17      NuStep   Level  2    Minutes  17    METs  1.5      Track   Laps  10    Minutes  17      Prescription Details   Frequency (times per week)  2    Duration  Progress to 45 minutes of aerobic exercise without signs/symptoms of physical distress      Intensity   THRR 40-80% of Max Heartrate  60-120    Ratings of Perceived Exertion  11-13    Perceived Dyspnea  0-4      Progression   Progression  Continue progressive overload as per policy without signs/symptoms or physical distress.      Resistance Training   Training Prescription  Yes    Weight  blue bands    Reps  10-15       Perform Capillary Blood Glucose checks as needed.  Exercise Prescription Changes: Exercise Prescription Changes    Row Name 10/08/17 1200 10/10/17 1300 10/15/17 1200 10/17/17 1200 10/29/17 1237     Response to Exercise   Blood Pressure (Admit)  100/60  120/56  110/60  110/58  130/58   Blood Pressure (Exercise)  152/74  110/70  140/50  120/60  130/70   Blood Pressure (Exit)  126/62  114/76  102/70  106/58  130/74   Heart Rate (Admit)  64 bpm  78 bpm  72 bpm  76 bpm  70 bpm   Heart Rate (Exercise)  80 bpm  80 bpm  87 bpm  87 bpm   91 bpm   Heart Rate (Exit)  70 bpm  73 bpm  64 bpm  72 bpm  70 bpm   Oxygen Saturation (Admit)  93 %  95 %  92 %  93 %  95 %   Oxygen Saturation (Exercise)  93 %  93 %  94 %  94 %  92 %   Oxygen Saturation (Exit)  92 %  90 %  92 %  93 %  92 %   Rating of Perceived Exertion (Exercise)  '13  13  13  13  13   ' Perceived Dyspnea (Exercise)  0  1  0  0  1   Duration  Progress to 45 minutes of aerobic exercise without signs/symptoms of physical distress  Progress to 45 minutes of aerobic exercise without signs/symptoms of physical distress  Progress to 45 minutes of aerobic exercise without signs/symptoms of physical distress  Continue with 45 min of aerobic exercise without signs/symptoms of physical distress.  Continue with 45 min of aerobic exercise without signs/symptoms of physical distress.   Intensity  Other (comment) 40-80% of HRR  Other (comment) 40-80% of HRR  THRR unchanged  -  -     Progression   Progression  Continue to progress workloads to maintain intensity without signs/symptoms of physical distress.  Continue to progress workloads to maintain intensity without signs/symptoms of physical distress.  Continue to progress workloads to maintain intensity without signs/symptoms of physical distress.  Continue to progress workloads to maintain intensity without signs/symptoms of physical distress.  Continue to progress workloads to maintain intensity without signs/symptoms of physical distress.     Resistance Training   Training Prescription  Yes  Yes  Yes  Yes  Yes   Weight  blue bands  blue bands  blue bands  blue bands  blue bands   Reps  10-15  10-15  10-15  10-15  10-15   Time  10 Minutes  10 Minutes  10 Minutes  10 Minutes  10 Minutes     Interval Training   Interval Training  -  -  -  No  No     Recumbant Bike   Level  2  -  1  2  2.5   Minutes  17  -  '17  17  17     ' NuStep   Level  '2  2  3  ' -  4   Minutes  '17  17  17  ' -  17   METs  1.7  1.7  1.7  -  -     Track   Laps  '13   13  13  10  13   ' Minutes  '17  17  17  17  17   ' Row Name 11/05/17 1200             Response to Exercise   Blood Pressure (Admit)  108/56       Blood Pressure (Exercise)  138/60       Blood Pressure (Exit)  104/70       Heart Rate (Admit)  69 bpm       Heart Rate (Exercise)  89 bpm       Heart Rate (Exit)  75 bpm       Oxygen Saturation (Admit)  91 %       Oxygen Saturation (Exercise)  91 %       Oxygen Saturation (Exit)  92 %       Rating of Perceived Exertion (Exercise)  13       Perceived Dyspnea (Exercise)  3       Duration  Continue with 45 min of aerobic exercise without signs/symptoms of physical distress.       Intensity  THRR unchanged         Progression   Progression  Continue to progress workloads to maintain intensity without signs/symptoms of physical distress.         Resistance Training   Training Prescription  Yes       Weight  blue bands       Reps  10-15       Time  10 Minutes         Interval Training   Interval Training  No         Recumbant Bike   Level  3       Minutes  17         NuStep   Level  5       Minutes  17       METs  2.5         Track   Laps  11       Minutes  17          Exercise Comments:   Exercise Goals and Review:   Exercise Goals Re-Evaluation : Exercise Goals Re-Evaluation    Row Name 10/17/17 0943 11/04/17 1633           Exercise Goal Re-Evaluation   Exercise Goals Review  Increase Physical Activity;Increase Strength and Stamina;Able to understand and use Dyspnea scale;Able to understand and use rate of perceived exertion (RPE) scale;Knowledge and understanding of Target Heart Rate Range (THRR);Understanding of Exercise Prescription  Increase Physical Activity;Increase Strength and Stamina;Able to understand and use Dyspnea scale;Able to understand and use rate of perceived exertion (RPE) scale;Knowledge and understanding of Target Heart Rate Range (THRR);Understanding of Exercise Prescription      Comments   Patient has only completed three days of rehab. Will monitor and progress as able.   Patient is slowly progressing. Walking 10-13 laps (200 ft. each) in 15 minutes. Will cont. to progress as able.       Expected Outcomes  Through exercise at home and at rehab, patient will increase strength stamina and be able to perform ADL's easier.   Through exercise at rehab and at home, patient will increase strength and stamina making ADL's easier to perform. Patient will also have a better understanding of safe exercise and what they are capable to do outside of clinical supervision.         Discharge Exercise Prescription (Final Exercise Prescription Changes): Exercise Prescription Changes - 11/05/17 1200      Response to Exercise   Blood Pressure (Admit)  108/56    Blood Pressure (Exercise)  138/60    Blood Pressure (Exit)  104/70    Heart Rate (Admit)  69 bpm    Heart Rate (Exercise)  89 bpm    Heart Rate (Exit)  75 bpm    Oxygen Saturation (Admit)  91 %    Oxygen Saturation (Exercise)  91 %    Oxygen Saturation (Exit)  92 %    Rating of Perceived Exertion (Exercise)  13    Perceived Dyspnea (Exercise)  3    Duration  Continue with 45 min of aerobic exercise without signs/symptoms of physical distress.    Intensity  THRR unchanged      Progression   Progression  Continue to  progress workloads to maintain intensity without signs/symptoms of physical distress.      Resistance Training   Training Prescription  Yes    Weight  blue bands    Reps  10-15    Time  10 Minutes      Interval Training   Interval Training  No      Recumbant Bike   Level  3    Minutes  17      NuStep   Level  5    Minutes  17    METs  2.5      Track   Laps  11    Minutes  17       Nutrition:  Target Goals: Understanding of nutrition guidelines, daily intake of sodium <1578m, cholesterol <2041m calories 30% from fat and 7% or less from saturated fats, daily to have 5 or more servings of fruits and  vegetables.  Biometrics:    Nutrition Therapy Plan and Nutrition Goals: Nutrition Therapy & Goals - 10/31/17 1425      Nutrition Therapy   Diet  Carb Modified, Heart Healthy      Personal Nutrition Goals   Nutrition Goal  Identify food quantities necessary to achieve wt loss of  -2# per week to a goal wt loss of 6-24 lb at graduation from pulmonary rehab.    Personal Goal #2  CBG's in the normal range or as close to normal as is safely possible.      Intervention Plan   Intervention  Prescribe, educate and counsel regarding individualized specific dietary modifications aiming towards targeted core components such as weight, hypertension, lipid management, diabetes, heart failure and other comorbidities.    Expected Outcomes  Short Term Goal: Understand basic principles of dietary content, such as calories, fat, sodium, cholesterol and nutrients.;Long Term Goal: Adherence to prescribed nutrition plan.       Nutrition Discharge: Rate Your Plate Scores: Nutrition Assessments - 10/31/17 1423      Rate Your Plate Scores   Pre Score  55       Nutrition Goals Re-Evaluation:   Nutrition Goals Discharge (Final Nutrition Goals Re-Evaluation):   Psychosocial: Target Goals: Acknowledge presence or absence of significant depression and/or stress, maximize coping skills, provide positive support system. Participant is able to verbalize types and ability to use techniques and skills needed for reducing stress and depression.  Initial Review & Psychosocial Screening: Initial Psych Review & Screening - 09/27/17 1305      Initial Review   Current issues with  None Identified      Family Dynamics   Good Support System?  Yes      Barriers   Psychosocial barriers to participate in program  There are no identifiable barriers or psychosocial needs.      Screening Interventions   Interventions  Encouraged to exercise       Quality of Life Scores:   PHQ-9: Recent Review Flowsheet  Data    [facility] 09/27/2017 06/25/2017   Decreased Interest 0 0   Down, Depressed, Hopeless 0 0   PHQ - 2 Score 0 0     Interpretation of Total Score  Total Score Depression Severity:  1-4 = Minimal depression, 5-9 = Mild depression, 10-14 = Moderate depression, 15-19 = Moderately severe depression, 20-27 = Severe depression   Psychosocial Evaluation and Intervention: Psychosocial Evaluation - 09/27/17 1305      Psychosocial Evaluation & Interventions   Interventions  Encouraged to exercise with the  program and follow exercise prescription    Expected Outcomes  patient will remain free from psychosocial barriers to participation in pulmonary rehab    Continue Psychosocial Services   No Follow up required       Psychosocial Re-Evaluation: Psychosocial Re-Evaluation    [place] Name 10/14/17 1631 11/04/17 1736           Psychosocial Re-Evaluation   Current issues with  None Identified  None Identified      Comments  -  patient has attended 90% of exercise sessions since admission      Expected Outcomes  patient will remain free from psychosocial barriers to exercise in pulmonary rehab  patient will remain free from psychosocial barriers to exercise in pulmonary rehab      Continue Psychosocial Services   No Follow up required  No Follow up required         Psychosocial Discharge (Final Psychosocial Re-Evaluation): Psychosocial Re-Evaluation - 11/04/17 1736      Psychosocial Re-Evaluation   Current issues with  None Identified    Comments  patient has attended 90% of exercise sessions since admission    Expected Outcomes  patient will remain free from psychosocial barriers to exercise in pulmonary rehab    Continue Psychosocial Services   No Follow up required       Education: Education Goals: Education classes will be provided on a weekly basis, covering required topics. Participant will state understanding/return demonstration of topics presented.  Learning  Barriers/Preferences:   Education Topics: Risk Factor Reduction:  -Group instruction that is supported by a PowerPoint presentation. Instructor discusses the definition of a risk factor, different risk factors for pulmonary disease, and how the heart and lungs work together.     Nutrition for Pulmonary Patient:  -Group instruction provided by PowerPoint slides, verbal discussion, and written materials to support subject matter. The instructor gives an explanation and review of healthy diet recommendations, which includes a discussion on weight management, recommendations for fruit and vegetable consumption, as well as protein, fluid, caffeine, fiber, sodium, sugar, and alcohol. Tips for eating when patients are short of breath are discussed.   Pursed Lip Breathing:  -Group instruction that is supported by demonstration and informational handouts. Instructor discusses the benefits of pursed lip and diaphragmatic breathing and detailed demonstration on how to preform both.     Oxygen Safety:  -Group instruction provided by PowerPoint, verbal discussion, and written material to support subject matter. There is an overview of "What is Oxygen" and "Why do we need it".  Instructor also reviews how to create a safe environment for oxygen use, the importance of using oxygen as prescribed, and the risks of noncompliance. There is a brief discussion on traveling with oxygen and resources the patient may utilize.   Oxygen Equipment:  -Group instruction provided by [facility] Staff utilizing handouts, written materials, and equipment demonstrations.   PULMONARY REHAB CHRONIC OBSTRUCTIVE PULMONARY DISEASE from 10/31/2017 in [place]  Date  10/31/17  Educator  [facility]  Instruction Review Code  2- meets goals/outcomes      Signs and Symptoms:  -Group instruction provided by written material and verbal discussion to support subject matter. Warning signs and  symptoms of infection, stroke, and heart attack are reviewed and when to call the physician/911 reinforced. Tips for preventing the spread of infection discussed.   Advanced Directives:  -Group instruction provided by verbal instruction and written material to support subject matter. Instructor reviews Chartered loss adjuster  and proper instruction for filling out document.   Pulmonary Video:  -Group video education that reviews the importance of medication and oxygen compliance, exercise, good nutrition, pulmonary hygiene, and pursed lip and diaphragmatic breathing for the pulmonary patient.   Exercise for the Pulmonary Patient:  -Group instruction that is supported by a PowerPoint presentation. Instructor discusses benefits of exercise, core components of exercise, frequency, duration, and intensity of an exercise routine, importance of utilizing pulse oximetry during exercise, safety while exercising, and options of places to exercise outside of rehab.     PULMONARY REHAB CHRONIC OBSTRUCTIVE PULMONARY DISEASE from 10/31/2017 in [place]  Date  10/17/17  Educator  Cloyde Reams  Instruction Review Code  2- meets goals/outcomes      Pulmonary Medications:  -Verbally interactive group education provided by instructor with focus on inhaled medications and proper administration.   Anatomy and Physiology of the Respiratory System and Intimacy:  -Group instruction provided by PowerPoint, verbal discussion, and written material to support subject matter. Instructor reviews respiratory cycle and anatomical components of the respiratory system and their functions. Instructor also reviews differences in obstructive and restrictive respiratory diseases with examples of each. Intimacy, Sex, and Sexuality differences are reviewed with a discussion on how relationships can change when diagnosed with pulmonary disease. Common sexual concerns are reviewed.   MD DAY -A group  question and answer session with a medical doctor that allows participants to ask questions that relate to their pulmonary disease state.   OTHER EDUCATION -Group or individual verbal, written, or video instructions that support the educational goals of the pulmonary rehab program.   PULMONARY REHAB CHRONIC OBSTRUCTIVE PULMONARY DISEASE from 10/31/2017 in [place]  Date  10/10/17 [Hol[employer]]  Educator  RD  Instruction Review Code  1- Verbalizes Understanding      Knowledge Questionnaire Score: Knowledge Questionnaire Score - 10/08/17 1514      Knowledge Questionnaire Score   Pre Score  10/13       Core Components/Risk Factors/Patient Goals at Admission: Personal Goals and Risk Factors at Admission - 09/27/17 1304      Core Components/Risk Factors/Patient Goals on Admission    Weight Management  Yes;Obesity    Intervention  Weight Management: Develop a combined nutrition and exercise program designed to reach desired caloric intake, while maintaining appropriate intake of nutrient and fiber, sodium and fats, and appropriate energy expenditure required for the weight goal.;Obesity: Provide education and appropriate resources to help participant work on and attain dietary goals.;Weight Management: Provide education and appropriate resources to help participant work on and attain dietary goals.;Weight Management/Obesity: Establish reasonable short term and long term weight goals.    Expected Outcomes  Short Term: Continue to assess and modify interventions until short term weight is achieved;Long Term: Adherence to nutrition and physical activity/exercise program aimed toward attainment of established weight goal;Weight Gain: Understanding of general recommendations for a high calorie, high protein meal plan that promotes weight gain by distributing calorie intake throughout the day with the consumption for 4-5 meals, snacks, and/or  supplements;Understanding of distribution of calorie intake throughout the day with the consumption of 4-5 meals/snacks;Understanding recommendations for meals to include 15-35% energy as protein, 25-35% energy from fat, 35-60% energy from carbohydrates, less than 272m of dietary cholesterol, 20-35 gm of total fiber daily;Weight Loss: Understanding of general recommendations for a balanced deficit meal plan, which promotes 1-2 lb weight loss per week and includes a negative energy balance of [id number]  kcal/d    Improve shortness of breath with ADL's  Yes    Intervention  Provide education, individualized exercise plan and daily activity instruction to help decrease symptoms of SOB with activities of daily living.    Expected Outcomes  Short Term: Achieves a reduction of symptoms when performing activities of daily living.    Develop more efficient breathing techniques such as purse lipped breathing and diaphragmatic breathing; and practicing self-pacing with activity  Yes    Intervention  Provide education, demonstration and support about specific breathing techniuqes utilized for more efficient breathing. Include techniques such as pursed lipped breathing, diaphragmatic breathing and self-pacing activity.    Expected Outcomes  Short Term: Participant will be able to demonstrate and use breathing techniques as needed throughout daily activities.    Diabetes  Yes    Intervention  Provide education about signs/symptoms and action to take for hypo/hyperglycemia.    Expected Outcomes  Short Term: Participant verbalizes understanding of the signs/symptoms and immediate care of hyper/hypoglycemia, proper foot care and importance of medication, aerobic/resistive exercise and nutrition plan for blood glucose control.;Long Term: Attainment of HbA1C < 7%.    Heart Failure  Yes    Intervention  Provide a combined exercise and nutrition program that is supplemented with education, support and counseling about heart  failure. Directed toward relieving symptoms such as shortness of breath, decreased exercise tolerance, and extremity edema.    Expected Outcomes  Improve functional capacity of life;Short term: Attendance in program 2-3 days a week with increased exercise capacity. Reported lower sodium intake. Reported increased fruit and vegetable intake. Reports medication compliance.;Short term: Daily weights obtained and reported for increase. Utilizing diuretic protocols set by physician.;Long term: Adoption of self-care skills and reduction of barriers for early signs and symptoms recognition and intervention leading to self-care maintenance.       Core Components/Risk Factors/Patient Goals Review:  Goals and Risk Factor Review    Row Name 10/14/17 1630 11/04/17 1730           Core Components/Risk Factors/Patient Goals Review   Personal Goals Review  Weight Management/Obesity;Improve shortness of breath with ADL's;Develop more efficient breathing techniques such as purse lipped breathing and diaphragmatic breathing and practicing self-pacing with activity.;Diabetes;Heart Failure  Weight Management/Obesity;Improve shortness of breath with ADL's;Develop more efficient breathing techniques such as purse lipped breathing and diaphragmatic breathing and practicing self-pacing with activity.;Diabetes;Heart Failure      Review  patient has only attended several session since admission and it is too soon to evaluate progression towards pulmonary rehab goals  patient is slowly progressing towards pulmonary rehab goals. He continues to be limited by ankle pain from a previous surgery that according to patient was "botched". Patient states that current orthopedist states only surgery will correct the issue but the patient is unsure if he is physically able to enure another surgery at this time. He prefers to recondition himself at pulmonary rehab prior to considering another surgery. This pain limits his ability to work to  his maximum cardiopulmonary fitness ability. He continues to be coached on the importants of maintaining a diabetic diet and taking responsibility of having the correct working equiptment/supplies at each exercise session. Working with patient to identify barriers. Patients weight remains stable although his goal is to begin a significant weightloss lifestyle. Will continue to coach patient with goals and help him identify barriers to obtaining goals.      Expected Outcomes  see admission outcomes  see admission outcomes  Core Components/Risk Factors/Patient Goals at Discharge (Final Review):  Goals and Risk Factor Review - 11/04/17 1730      Core Components/Risk Factors/Patient Goals Review   Personal Goals Review  Weight Management/Obesity;Improve shortness of breath with ADL's;Develop more efficient breathing techniques such as purse lipped breathing and diaphragmatic breathing and practicing self-pacing with activity.;Diabetes;Heart Failure    Review  patient is slowly progressing towards pulmonary rehab goals. He continues to be limited by ankle pain from a previous surgery that according to patient was "botched". Patient states that current orthopedist states only surgery will correct the issue but the patient is unsure if he is physically able to enure another surgery at this time. He prefers to recondition himself at pulmonary rehab prior to considering another surgery. This pain limits his ability to work to his maximum cardiopulmonary fitness ability. He continues to be coached on the importants of maintaining a diabetic diet and taking responsibility of having the correct working equiptment/supplies at each exercise session. Working with patient to identify barriers. Patients weight remains stable although his goal is to begin a significant weightloss lifestyle. Will continue to coach patient with goals and help him identify barriers to obtaining goals.    Expected Outcomes  see  admission outcomes       ITP Comments:   Comments: patient has attended 7 sessions since admission

## 2017-11-07 NOTE — Progress Notes (Signed)
Daily Session Note  Patient Details  Name: Jeremiah [name] MRN: [id number] Date of Birth: Feb 19, 1946 Referring Provider:     Pulmonary Rehab Walk Test from 10/03/2017 in [place]  Referring Provider  Dr. Melvyn Novas      Encounter Date: 11/07/2017  Check In: Session Check In - 11/07/17 1231      Check-In   Location  [facility]    Staff Present  Rosebud Poles, RN, BSN;Molly diVincenzo, MS, ACSM RCEP, Exercise Physiologist;Jordi Kamm Ysidro Evert, RN;Portia Rollene Rotunda, RN, BSN    Supervising physician immediately available to respond to emergencies  [facility] immediately available    Physician(s)  Dr. Starla Link    Medication changes reported      No    Fall or balance concerns reported     No    Tobacco Cessation  No Change    Warm-up and Cool-down  Performed as group-led instruction    Resistance Training Performed  Yes    VAD Patient?  No      Pain Assessment   Currently in Pain?  No/denies    Multiple Pain Sites  No       Capillary Blood Glucose: No results found for this or any previous visit (from the past 24 hour(s)). POCT Glucose - 11/07/17 1249      POCT Blood Glucose   Pre-Exercise  241 mg/dL    Post-Exercise  134 mg/dL      Exercise Prescription Changes - 11/07/17 1200      Response to Exercise   Blood Pressure (Admit)  104/66    Blood Pressure (Exercise)  138/70    Blood Pressure (Exit)  130/80    Heart Rate (Admit)  77 bpm    Heart Rate (Exercise)  95 bpm    Heart Rate (Exit)  71 bpm    Oxygen Saturation (Admit)  93 %    Oxygen Saturation (Exercise)  93 %    Oxygen Saturation (Exit)  93 %    Rating of Perceived Exertion (Exercise)  15    Perceived Dyspnea (Exercise)  0    Duration  Continue with 45 min of aerobic exercise without signs/symptoms of physical distress.    Intensity  THRR unchanged      Progression   Progression  Continue to progress workloads to maintain intensity without signs/symptoms of physical distress.       Resistance Training   Training Prescription  Yes    Weight  blue bands    Reps  10-15    Time  10 Minutes      Interval Training   Interval Training  No      NuStep   Level  5    Minutes  17    METs  3.2      Track   Laps  15    Minutes  17       Social History   Tobacco Use  Smoking Status Former Smoker  . Packs/day: 0.50  . Years: 60.00  . Pack years: 30.00  . Types: Cigarettes  . Last attempt to quit: 12/15/2016  . Years since quitting: 0.8  Smokeless Tobacco Never Used    Goals Met:  Exercise tolerated well No report of cardiac concerns or symptoms Strength training completed today  Goals Unmet:  Not Applicable  Comments: Service time is from 1030 to 1230    Dr. Rush Farmer is Medical Director for Pulmonary Rehab at [facility].

## 2017-11-12 ENCOUNTER — Encounter (HOSPITAL_COMMUNITY)
Admission: RE | Admit: 2017-11-12 | Discharge: 2017-11-12 | Disposition: A | Discharge status: Home / Self Care | Payer: Federal, State, Local not specified - PPO | Source: Ambulatory Visit | Attending: Internal Medicine | Admitting: Internal Medicine

## 2017-11-12 VITALS — Wt 250.7 lb

## 2017-11-12 DIAGNOSIS — J449 Chronic obstructive pulmonary disease, unspecified: Secondary | ICD-10-CM

## 2017-11-12 NOTE — Progress Notes (Signed)
Incomplete Session Note  Patient Details  Name: Jeremiah [name] MRN: [id number] Date of Birth: 02-06-1946 Referring Provider:     Pulmonary Rehab Walk Test from 10/03/2017 in [place]  Referring Provider  [name] did not complete his rehab session.  He met with the nutritionist the entire exercise session and did not exercise at all.

## 2017-11-12 NOTE — Progress Notes (Signed)
Jeremiah [name] 71 y.o. male   DOB: [birth year] MRN: [id number]          Nutrition 1. Chronic obstructive pulmonary disease, unspecified COPD type ([place])    Meds reviewed. Glimepiride, Metformin noted    Current tobacco use? No     Pt recently quit tobacco use 12/15/16  Labs:  Lipid Panel  No results found for: CHOL, TRIG, HDL, CHOLHDL, VLDL, LDLCALC, LDLDIRECT  04/1617 Hemoglobin A1c 6.5 Note Spoke with pt. Pt is obese and wants to lose wt. Pt reports wt gain over 3 years due to ankle injury, decreased activity, and increased eating for comfort. Pt is currently following an 1800 kcal diet by writing down what he's eating. Pt c/o frustration not losing wt despite using smaller plates and making healthier food choices. Pt is making healthy food choices the majority of the time according to pt's Rate Your Plate results. Wt loss tips reviewed, which included exercising on non-rehab days. Barrier to exercising include ankle pain which limits pt's mobility. Pt has tried water aerobics, which "didn't do anything." Pt is now considering using a bike for exercise. Pt is diabetic.  Pt checks CBG's every other day.  Fasting CBG's reportedly 130 -200 mg/dL.  Post-prandial nighttime CBG's 130-140  - mg/dL according to pt. Pt states his last A1c was 7.1 "about a week ago." A1c increased over the past 6 months. Per discussion, pt does not like taking medication. The role of pt's diabetic medication in pt's help and wt loss effort discussed. Pt expressed understanding of the information reviewed via feedback method.    Nutrition Diagnosis ? Food-and nutrition-related knowledge deficit related to lack of exposure to information as related to diagnosis of pulmonary disease ? Obesity related to excessive energy intake as evidenced by a BMI of 40.4  Nutrition Intervention ? Pt's individual nutrition plan and goals reviewed with pt. ? Benefits of adopting healthy eating habits discussed when pt's Rate Your Plate  reviewed. ? Handout given for: 1500-1800 kcal, 5 day menu ideas ? Pt to consider discussing alternative DM medication to assist with wt loss (e.g. GLP-1 receptor agonist)  Goal(s) 1. Identify food quantities necessary to achieve wt loss of  -2# per week to a goal wt loss of 6-24 lb at graduation from pulmonary rehab. 2. CBG's in the normal range or as close to normal as is safely possible.  Plan:  Pt to attend Pulmonary Nutrition class Will provide client-centered nutrition education as part of interdisciplinary care.   Monitor and evaluate progress toward nutrition goal with team.  Monitor and Evaluate progress toward nutrition goal with team.   Derek Mound, M.Ed, RD, LDN, CDE 11/12/2017 12:15 PM

## 2017-11-14 ENCOUNTER — Encounter (HOSPITAL_COMMUNITY)
Admission: RE | Admit: 2017-11-14 | Discharge: 2017-11-14 | Disposition: A | Discharge status: Home / Self Care | Payer: Federal, State, Local not specified - PPO | Source: Ambulatory Visit | Attending: Internal Medicine | Admitting: Internal Medicine

## 2017-11-14 VITALS — Wt 248.5 lb

## 2017-11-14 DIAGNOSIS — E119 Type 2 diabetes mellitus without complications: Secondary | ICD-10-CM | POA: Diagnosis not present

## 2017-11-14 DIAGNOSIS — J449 Chronic obstructive pulmonary disease, unspecified: Secondary | ICD-10-CM

## 2017-11-14 DIAGNOSIS — Z713 Dietary counseling and surveillance: Secondary | ICD-10-CM | POA: Diagnosis not present

## 2017-11-14 NOTE — Progress Notes (Signed)
Daily Session Note  Patient Details  Name: Jeremiah [name] MRN: [id number] Date of Birth: 07-10-1946 Referring Provider:     Pulmonary Rehab Walk Test from 10/03/2017 in [place]  Referring Provider  Dr. Melvyn Novas      Encounter Date: 11/14/2017  Check In: Session Check In - 11/14/17 1215      Check-In   Location  [facility]    Staff Present  Su Hilt, MS, ACSM RCEP, Exercise Physiologist;Portia Rollene Rotunda, RN, Maxcine Ham, RN, BSN    Supervising physician immediately available to respond to emergencies  [facility] immediately available    Physician(s)  Dr. Starla Link    Medication changes reported      No    Fall or balance concerns reported     No    Tobacco Cessation  No Change    Warm-up and Cool-down  Performed as group-led instruction    Resistance Training Performed  Yes    VAD Patient?  No      Pain Assessment   Currently in Pain?  No/denies    Multiple Pain Sites  No       Capillary Blood Glucose: No results found for this or any previous visit (from the past 24 hour(s)). POCT Glucose - 11/14/17 1236      POCT Blood Glucose   Pre-Exercise  112 mg/dL    Post-Exercise  85 mg/dL given a jolly rancer before leaving      Exercise Prescription Changes - 11/14/17 1200      Response to Exercise   Blood Pressure (Admit)  110/54    Blood Pressure (Exercise)  132/70    Blood Pressure (Exit)  114/60    Heart Rate (Admit)  73 bpm    Heart Rate (Exercise)  92 bpm    Heart Rate (Exit)  74 bpm    Oxygen Saturation (Admit)  92 %    Oxygen Saturation (Exercise)  92 %    Oxygen Saturation (Exit)  93 %    Rating of Perceived Exertion (Exercise)  13    Perceived Dyspnea (Exercise)  1    Duration  Progress to 45 minutes of aerobic exercise without signs/symptoms of physical distress    Intensity  THRR unchanged      Progression   Progression  Continue to progress workloads to maintain intensity without signs/symptoms of  physical distress.      Resistance Training   Training Prescription  Yes    Weight  blue bands    Reps  10-15    Time  10 Minutes      Interval Training   Interval Training  No      Recumbant Bike   Level  3    Minutes  17      NuStep   Level  5    Minutes  17    METs  2.5      Track   Laps  13    Minutes  17       Social History   Tobacco Use  Smoking Status Former Smoker  . Packs/day: 0.50  . Years: 60.00  . Pack years: 30.00  . Types: Cigarettes  . Last attempt to quit: 12/15/2016  . Years since quitting: 0.9  Smokeless Tobacco Never Used    Goals Met:  Exercise tolerated well Strength training completed today  Goals Unmet:  Not Applicable  Comments: Service time is from 1030 to 1205    Dr. Michaelene Song  Kathryne Sharper is Market researcher for Pulmonary Rehab at [facility].

## 2017-11-19 ENCOUNTER — Encounter (HOSPITAL_COMMUNITY)
Admission: RE | Admit: 2017-11-19 | Discharge: 2017-11-19 | Disposition: A | Discharge status: Home / Self Care | Payer: Federal, State, Local not specified - PPO | Source: Ambulatory Visit | Attending: Internal Medicine | Admitting: Internal Medicine

## 2017-11-19 VITALS — Wt 250.2 lb

## 2017-11-19 DIAGNOSIS — Z713 Dietary counseling and surveillance: Secondary | ICD-10-CM | POA: Diagnosis not present

## 2017-11-19 DIAGNOSIS — E119 Type 2 diabetes mellitus without complications: Secondary | ICD-10-CM | POA: Diagnosis not present

## 2017-11-19 DIAGNOSIS — J449 Chronic obstructive pulmonary disease, unspecified: Secondary | ICD-10-CM

## 2017-11-19 NOTE — Progress Notes (Signed)
Daily Session Note  Patient Details  Name: Jeremiah [name] MRN: [id number] Date of Birth: May 02, 1946 Referring Provider:     Pulmonary Rehab Walk Test from 10/03/2017 in [place]  Referring Provider  Dr. Melvyn Novas      Encounter Date: 11/19/2017  Check In: Session Check In - 11/19/17 1030      Check-In   Staff Present  Su Hilt, MS, ACSM RCEP, Exercise Physiologist;Josip Merolla Rollene Rotunda, RN, Maxcine Ham, RN, Roque Cash, RN    Supervising physician immediately available to respond to emergencies  [facility] immediately available    Physician(s)  Dr. Eliseo Squires    Medication changes reported      No    Fall or balance concerns reported     No    Tobacco Cessation  No Change    Warm-up and Cool-down  Performed as group-led instruction    Resistance Training Performed  Yes    VAD Patient?  No      Pain Assessment   Currently in Pain?  No/denies    Multiple Pain Sites  No       Capillary Blood Glucose: No results found for this or any previous visit (from the past 24 hour(s)). POCT Glucose - 11/19/17 1232      POCT Blood Glucose   Pre-Exercise  126 mg/dL    Post-Exercise  125 mg/dL      Exercise Prescription Changes - 11/19/17 1230      Response to Exercise   Blood Pressure (Admit)  108/50    Blood Pressure (Exercise)  132/72    Blood Pressure (Exit)  106/54    Heart Rate (Admit)  79 bpm    Heart Rate (Exercise)  92 bpm    Heart Rate (Exit)  77 bpm    Oxygen Saturation (Admit)  97 %    Oxygen Saturation (Exercise)  93 %    Oxygen Saturation (Exit)  92 %    Rating of Perceived Exertion (Exercise)  13    Perceived Dyspnea (Exercise)  1    Duration  Progress to 45 minutes of aerobic exercise without signs/symptoms of physical distress    Intensity  THRR unchanged      Progression   Progression  Continue to progress workloads to maintain intensity without signs/symptoms of physical distress.      Resistance Training   Training  Prescription  Yes    Weight  blue bands    Reps  10-15    Time  10 Minutes      Interval Training   Interval Training  No      Recumbant Bike   Level  3    Minutes  17      NuStep   Level  5    Minutes  17    METs  2.2      Track   Laps  12    Minutes  17       Social History   Tobacco Use  Smoking Status Former Smoker  . Packs/day: 0.50  . Years: 60.00  . Pack years: 30.00  . Types: Cigarettes  . Last attempt to quit: 12/15/2016  . Years since quitting: 0.9  Smokeless Tobacco Never Used    Goals Met:  Independence with exercise equipment Improved SOB with ADL's Using PLB without cueing & demonstrates good technique Exercise tolerated well No report of cardiac concerns or symptoms Strength training completed today  Goals Unmet:  Not Applicable  Comments: Service time  is from 1030 to 1220   Dr. Rush Farmer is Medical Director for Pulmonary Rehab at [facility].

## 2017-11-21 ENCOUNTER — Encounter (HOSPITAL_COMMUNITY)
Admission: RE | Admit: 2017-11-21 | Discharge: 2017-11-21 | Disposition: A | Discharge status: Home / Self Care | Payer: Federal, State, Local not specified - PPO | Source: Ambulatory Visit | Attending: Internal Medicine | Admitting: Internal Medicine

## 2017-11-21 VITALS — Wt 251.5 lb

## 2017-11-21 DIAGNOSIS — J449 Chronic obstructive pulmonary disease, unspecified: Secondary | ICD-10-CM

## 2017-11-21 DIAGNOSIS — Z713 Dietary counseling and surveillance: Secondary | ICD-10-CM | POA: Diagnosis not present

## 2017-11-21 DIAGNOSIS — E119 Type 2 diabetes mellitus without complications: Secondary | ICD-10-CM | POA: Diagnosis not present

## 2017-11-21 NOTE — Progress Notes (Signed)
Daily Session Note  Patient Details  Name: Jeremiah [name] MRN: [id number] Date of Birth: Jun 10, 1946 Referring Provider:     Pulmonary Rehab Walk Test from 10/03/2017 in [place]  Referring Provider  Dr. Melvyn Novas      Encounter Date: 11/21/2017  Check In: Session Check In - 11/21/17 1123      Check-In   Location  [facility]    Staff Present  Su Hilt, MS, ACSM RCEP, Exercise Physiologist;Portia Rollene Rotunda, RN, Roque Cash, RN    Supervising physician immediately available to respond to emergencies  [facility] immediately available    Physician(s)  Dr. Wendee Beavers    Medication changes reported      No    Fall or balance concerns reported     No    Tobacco Cessation  No Change    Warm-up and Cool-down  Performed as group-led instruction    Resistance Training Performed  Yes    VAD Patient?  No      Pain Assessment   Currently in Pain?  No/denies    Multiple Pain Sites  No       Capillary Blood Glucose: No results found for this or any previous visit (from the past 24 hour(s)).  Exercise Prescription Changes - 11/21/17 1300      Response to Exercise   Blood Pressure (Admit)  100/52    Blood Pressure (Exercise)  154/72    Blood Pressure (Exit)  104/60    Heart Rate (Admit)  71 bpm    Heart Rate (Exercise)  87 bpm    Heart Rate (Exit)  60 bpm    Oxygen Saturation (Admit)  94 %    Oxygen Saturation (Exercise)  92 %    Oxygen Saturation (Exit)  92 %    Rating of Perceived Exertion (Exercise)  13    Perceived Dyspnea (Exercise)  1    Duration  Progress to 45 minutes of aerobic exercise without signs/symptoms of physical distress    Intensity  THRR unchanged      Progression   Progression  Continue to progress workloads to maintain intensity without signs/symptoms of physical distress.      Resistance Training   Training Prescription  Yes    Weight  blue bands    Reps  10-15    Time  10 Minutes      Interval  Training   Interval Training  No      Recumbant Bike   Level  3.5    Minutes  17      Track   Laps  11    Minutes  17       Social History   Tobacco Use  Smoking Status Former Smoker  . Packs/day: 0.50  . Years: 60.00  . Pack years: 30.00  . Types: Cigarettes  . Last attempt to quit: 12/15/2016  . Years since quitting: 0.9  Smokeless Tobacco Never Used    Goals Met:  Exercise tolerated well No report of cardiac concerns or symptoms Strength training completed today  Goals Unmet:  Not Applicable  Comments: Service time is from 10:30a to 12:45p    Dr. Rush Farmer is Medical Director for Pulmonary Rehab at [facility].

## 2017-11-28 ENCOUNTER — Encounter (HOSPITAL_COMMUNITY)
Admission: RE | Admit: 2017-11-28 | Discharge: 2017-11-28 | Disposition: A | Discharge status: Home / Self Care | Payer: Federal, State, Local not specified - PPO | Source: Ambulatory Visit | Attending: Internal Medicine | Admitting: Internal Medicine

## 2017-11-28 VITALS — Wt 249.8 lb

## 2017-11-28 DIAGNOSIS — Z713 Dietary counseling and surveillance: Secondary | ICD-10-CM | POA: Diagnosis not present

## 2017-11-28 DIAGNOSIS — J449 Chronic obstructive pulmonary disease, unspecified: Secondary | ICD-10-CM

## 2017-11-28 DIAGNOSIS — E119 Type 2 diabetes mellitus without complications: Secondary | ICD-10-CM | POA: Diagnosis not present

## 2017-11-28 NOTE — Progress Notes (Signed)
Daily Session Note  Patient Details  Name: Jeremiah [name] MRN: [id number] Date of Birth: 08-22-1946 Referring Provider:     Pulmonary Rehab Walk Test from 10/03/2017 in [facility]  Referring Provider  Dr. Melvyn Novas      Encounter Date: 11/28/2017  Check In: Session Check In - 11/28/17 1030      Check-In   Location  [facility]    Staff Present  Rosebud Poles, RN, BSN;Ramon Dredge, RN, MHA;Shaylen Nephew Rollene Rotunda, RN, Roque Cash, RN    Supervising physician immediately available to respond to emergencies  [facility] immediately available    Physician(s)  Dr. Rockne Menghini    Medication changes reported      No    Fall or balance concerns reported     No    Tobacco Cessation  No Change    Warm-up and Cool-down  Performed as group-led instruction    Resistance Training Performed  Yes    VAD Patient?  No      Pain Assessment   Currently in Pain?  No/denies    Multiple Pain Sites  No       Capillary Blood Glucose: No results found for this or any previous visit (from the past 24 hour(s)). POCT Glucose - 11/28/17 1239      POCT Blood Glucose   Pre-Exercise  196 mg/dL    Post-Exercise  167 mg/dL      Exercise Prescription Changes - 11/28/17 1236      Response to Exercise   Blood Pressure (Admit)  124/70    Blood Pressure (Exercise)  132/70    Blood Pressure (Exit)  98/62    Heart Rate (Admit)  86 bpm    Heart Rate (Exercise)  91 bpm    Heart Rate (Exit)  78 bpm    Oxygen Saturation (Admit)  93 %    Oxygen Saturation (Exercise)  91 %    Oxygen Saturation (Exit)  93 %    Rating of Perceived Exertion (Exercise)  13    Perceived Dyspnea (Exercise)  0    Duration  Progress to 45 minutes of aerobic exercise without signs/symptoms of physical distress    Intensity  THRR unchanged      Progression   Progression  Continue to progress workloads to maintain intensity without signs/symptoms of physical distress.      Resistance Training    Training Prescription  Yes    Weight  blue bands    Reps  10-15    Time  10 Minutes      Interval Training   Interval Training  No      Recumbant Bike   Level  4    Minutes  17      NuStep   Level  5    Minutes  17    METs  2.1      Track   Laps  11    Minutes  17       Social History   Tobacco Use  Smoking Status Former Smoker  . Packs/day: 0.50  . Years: 60.00  . Pack years: 30.00  . Types: Cigarettes  . Last attempt to quit: 12/15/2016  . Years since quitting: 0.9  Smokeless Tobacco Never Used    Goals Met:  Independence with exercise equipment Improved SOB with ADL's Using PLB without cueing & demonstrates good technique Exercise tolerated well No report of cardiac concerns or symptoms Strength training completed today  Goals Unmet:  Not Applicable  Comments: Service time is from 1030 to 1215   Dr. Rush Farmer is Medical Director for Pulmonary Rehab at [facility].

## 2017-11-28 NOTE — Progress Notes (Signed)
Pulmonary Individual Treatment Plan  Patient Details  Name: Jeremiah [name] MRN: [id number] Date of Birth: 07-19-1946 Referring Provider:     Pulmonary Rehab Walk Test from 10/03/2017 in [place]  Referring Provider  Dr. Melvyn Novas      Initial Encounter Date:    Pulmonary Rehab Walk Test from 10/03/2017 in [place]  Date  10/03/17  Referring Provider  Dr. Melvyn Novas      Visit Diagnosis: Chronic obstructive pulmonary disease, unspecified COPD type ([place])  Patient's Home Medications on Admission:   Current Outpatient Medications:  .  aspirin 81 MG tablet, Take 1 tablet (81 mg total) by mouth daily. Hold for 5 days (Patient taking differently: Take 81 mg by mouth daily. ), Disp: 30 tablet, Rfl:  .  atorvastatin (LIPITOR) 80 MG tablet, Take 80 mg by mouth daily., Disp: , Rfl:  .  betamethasone dipropionate 0.05 % lotion, Apply topically daily. Apply to affected area daily, Disp: , Rfl:  .  bisoprolol (ZEBETA) 5 MG tablet, Take 1 tablet (5 mg total) by mouth daily., Disp: 60 tablet, Rfl: 11 .  furosemide (LASIX) 40 MG tablet, Take 1 tablet (40 mg total) by mouth daily., Disp: 90 tablet, Rfl: 3 .  glimepiride (AMARYL) 1 MG tablet, Take 1 tablet by mouth daily., Disp: , Rfl:  .  ipratropium-albuterol (DUONEB) 0.5-2.5 (3) MG/3ML SOLN, Take 3 mLs by nebulization every 4 (four) hours as needed., Disp: 360 mL, Rfl: 0 .  IRON PO, Take 1 tablet by mouth daily., Disp: , Rfl:  .  ketoconazole (NIZORAL) 2 % shampoo, AS DIRECTED, Disp: , Rfl:  .  levothyroxine (SYNTHROID, LEVOTHROID) 75 MCG tablet, Take 75 mcg by mouth daily before breakfast., Disp: , Rfl:  .  metFORMIN (GLUCOPHAGE) 1000 MG tablet, Take 1,000 mg by mouth daily with breakfast., Disp: , Rfl:  .  OXYGEN, 2.5 lpm with sleep only AHC, Disp: , Rfl:  .  pantoprazole (PROTONIX) 40 MG tablet, Take 1 tablet (40 mg total) by mouth 2 (two) times daily before a meal., Disp: 60 tablet, Rfl: 2 .   Turmeric 500 MG TABS, Take 500 mg by mouth daily., Disp: , Rfl:   Past Medical History: Past Medical History:  Diagnosis Date  . A-fib ([place])   . CAD (coronary artery disease)    a. 2006: CABG in 2006 with LIMA-LAD, SVG-OM1, and SVG-RPDA  . Chronic pain 03/21/2016  . Essential hypertension 03/21/2016  . History of prostate cancer 03/21/2016  . HLD (hyperlipidemia)   . HTN (hypertension)   . Hx of CABG 2006  . Hypercholesteremia 03/21/2016  . Hypothyroidism   . Medication management 03/21/2016  . Morbid obesity ([place]) 03/21/2016  . Myocardial infarct ([place])   . OSA (obstructive sleep apnea) 03/21/2016  . Pain in right ankle and joints of right foot 03/21/2016  . Paronychia 03/21/2016  . Primary insomnia 03/21/2016  . Prostate cancer ([place]) 2008  . Tobacco dependence 03/21/2016    Tobacco Use: Social History   Tobacco Use  Smoking Status Former Smoker  . Packs/day: 0.50  . Years: 60.00  . Pack years: 30.00  . Types: Cigarettes  . Last attempt to quit: 12/15/2016  . Years since quitting: 0.9  Smokeless Tobacco Never Used    Labs: Recent Review Scientist, physiological    Labs for ITP Cardiac and Pulmonary Rehab Latest Ref Rng & Units 05/05/2016 02/25/2017 02/25/2017   HCO3 20.0 - 28.0 mmol/L - 36.1(H) -   TCO2  0 - 100 mmol/L 26 38 33   O2SAT % - 86.0 -      Capillary Blood Glucose: Lab Results  Component Value Date   GLUCAP 138 (H) 10/29/2017   GLUCAP 236 (H) 10/29/2017   GLUCAP 118 (H) 10/17/2017   GLUCAP 163 (H) 10/08/2017   GLUCAP 196 (H) 10/08/2017   POCT Glucose    Row Name 10/08/17 1228 10/10/17 1315 10/15/17 1255 10/29/17 1238 11/05/17 1228     POCT Blood Glucose   Pre-Exercise  196 mg/dL  117 mg/dL  205 mg/dL  236 mg/dL  189 mg/dL   Post-Exercise  163 mg/dL  117 mg/dL  153 mg/dL  138 mg/dL  120 mg/dL   Row Name 11/07/17 1249 11/12/17 1200 11/14/17 1236 11/19/17 1232       POCT Blood Glucose   Pre-Exercise  241 mg/dL  136 mg/dL  112 mg/dL  126 mg/dL    Post-Exercise  134  mg/dL  184 mg/dL  85 mg/dL given a jolly rancer before leaving  125 mg/dL       Pulmonary Assessment Scores: Pulmonary Assessment Scores    Row Name 10/03/17 1644 10/08/17 1514       ADL UCSD   ADL Phase  Entry  Entry    SOB Score total  -  34      CAT Score   CAT Score  -  16 Entry      mMRC Score   mMRC Score  2  -       Pulmonary Function Assessment: Pulmonary Function Assessment - 09/27/17 1304      Breath   Bilateral Breath Sounds  Clear;Decreased    Shortness of Breath  Yes;Limiting activity       Exercise Target Goals:    Exercise Program Goal: Individual exercise prescription set with THRR, safety & activity barriers. Participant demonstrates ability to understand and report RPE using BORG scale, to self-measure pulse accurately, and to acknowledge the importance of the exercise prescription.  Exercise Prescription Goal: Starting with aerobic activity 30 plus minutes a day, 3 days per week for initial exercise prescription. Provide home exercise prescription and guidelines that participant acknowledges understanding prior to discharge.  Activity Barriers & Risk Stratification: Activity Barriers & Cardiac Risk Stratification - 09/27/17 1238      Activity Barriers & Cardiac Risk Stratification   Activity Barriers  Shortness of Breath;Deconditioning;Joint Problems       6 Minute Walk: 6 Minute Walk    Row Name 10/03/17 1639         6 Minute Walk   Phase  Initial     Distance  1000 feet     Walk Time  6 minutes     # of Rest Breaks  0     MPH  1.89     METS  2.45     RPE  13     Perceived Dyspnea   1     Symptoms  Yes (comment)     Comments  7/10 ankle pain     Resting HR  93 bpm     Resting BP  144/70     Resting Oxygen Saturation   93 %     Exercise Oxygen Saturation  during 6 min walk  89 %     Max Ex. HR  116 bpm     Max Ex. BP  160/72       Interval HR   1 Minute HR  94  2 Minute HR  94     3 Minute HR  104     4 Minute HR  112      5 Minute HR  116     6 Minute HR  127     Interval Heart Rate?  Yes       Interval Oxygen   Interval Oxygen?  Yes     Baseline Oxygen Saturation %  93 %     1 Minute Oxygen Saturation %  92 %     1 Minute Liters of Oxygen  0 L     2 Minute Oxygen Saturation %  89 %     2 Minute Liters of Oxygen  0 L     3 Minute Oxygen Saturation %  91 %     3 Minute Liters of Oxygen  0 L     4 Minute Oxygen Saturation %  90 %     4 Minute Liters of Oxygen  0 L     5 Minute Oxygen Saturation %  92 %     5 Minute Liters of Oxygen  0 L     6 Minute Oxygen Saturation %  91 %     6 Minute Liters of Oxygen  0 L        Oxygen Initial Assessment: Oxygen Initial Assessment - 10/03/17 1644      Initial 6 min Walk   Oxygen Used  None      Program Oxygen Prescription   Program Oxygen Prescription  None       Oxygen Re-Evaluation: Oxygen Re-Evaluation    Row Name 10/14/17 1628 11/04/17 1730 11/28/17 0730         Program Oxygen Prescription   Program Oxygen Prescription  None  None  None       Home Oxygen   Home Oxygen Device  Home Concentrator;E-Tanks  Home Concentrator;E-Tanks  Home Concentrator;E-Tanks     Sleep Oxygen Prescription  CPAP;Continuous  CPAP;Continuous  CPAP;Continuous     Liters per minute  2.5  2.5  2.5     Home Exercise Oxygen Prescription  None  None  None     Liters per minute  0  0  0     Home at Rest Exercise Oxygen Prescription  None  None  None     Compliance with Home Oxygen Use  No  No  No       Goals/Expected Outcomes   Short Term Goals  To learn and exhibit compliance with exercise, home and travel O2 prescription;To learn and understand importance of monitoring SPO2 with pulse oximeter and demonstrate accurate use of the pulse oximeter.;To learn and understand importance of maintaining oxygen saturations>88%;To learn and demonstrate proper pursed lip breathing techniques or other breathing techniques.;To learn and demonstrate proper use of respiratory  medications  To learn and exhibit compliance with exercise, home and travel O2 prescription;To learn and understand importance of monitoring SPO2 with pulse oximeter and demonstrate accurate use of the pulse oximeter.;To learn and understand importance of maintaining oxygen saturations>88%;To learn and demonstrate proper pursed lip breathing techniques or other breathing techniques.;To learn and demonstrate proper use of respiratory medications  To learn and exhibit compliance with exercise, home and travel O2 prescription;To learn and understand importance of monitoring SPO2 with pulse oximeter and demonstrate accurate use of the pulse oximeter.;To learn and understand importance of maintaining oxygen saturations>88%;To learn and demonstrate proper pursed lip breathing techniques or other breathing techniques.;To learn and  demonstrate proper use of respiratory medications     Long  Term Goals  Exhibits compliance with exercise, home and travel O2 prescription;Maintenance of O2 saturations>88%;Compliance with respiratory medication;Verbalizes importance of monitoring SPO2 with pulse oximeter and return demonstration;Exhibits proper breathing techniques, such as pursed lip breathing or other method taught during program session  Exhibits compliance with exercise, home and travel O2 prescription;Maintenance of O2 saturations>88%;Compliance with respiratory medication;Verbalizes importance of monitoring SPO2 with pulse oximeter and return demonstration;Exhibits proper breathing techniques, such as pursed lip breathing or other method taught during program session  Exhibits compliance with exercise, home and travel O2 prescription;Maintenance of O2 saturations>88%;Compliance with respiratory medication;Verbalizes importance of monitoring SPO2 with pulse oximeter and return demonstration;Exhibits proper breathing techniques, such as pursed lip breathing or other method taught during program session     Comments   patient does not require oxygen during exercise and able to maintain saturations >93% with exertion.  patient does not require oxygen during exercise and able to maintain saturations >93% with exertion.  patient does not require oxygen during exercise and able to maintain saturations >93% with exertion.        Oxygen Discharge (Final Oxygen Re-Evaluation): Oxygen Re-Evaluation - 11/28/17 0730      Program Oxygen Prescription   Program Oxygen Prescription  None      Home Oxygen   Home Oxygen Device  Home Concentrator;E-Tanks    Sleep Oxygen Prescription  CPAP;Continuous    Liters per minute  2.5    Home Exercise Oxygen Prescription  None    Liters per minute  0    Home at Rest Exercise Oxygen Prescription  None    Compliance with Home Oxygen Use  No      Goals/Expected Outcomes   Short Term Goals  To learn and exhibit compliance with exercise, home and travel O2 prescription;To learn and understand importance of monitoring SPO2 with pulse oximeter and demonstrate accurate use of the pulse oximeter.;To learn and understand importance of maintaining oxygen saturations>88%;To learn and demonstrate proper pursed lip breathing techniques or other breathing techniques.;To learn and demonstrate proper use of respiratory medications    Long  Term Goals  Exhibits compliance with exercise, home and travel O2 prescription;Maintenance of O2 saturations>88%;Compliance with respiratory medication;Verbalizes importance of monitoring SPO2 with pulse oximeter and return demonstration;Exhibits proper breathing techniques, such as pursed lip breathing or other method taught during program session    Comments  patient does not require oxygen during exercise and able to maintain saturations >93% with exertion.       Initial Exercise Prescription: Initial Exercise Prescription - 10/03/17 1600      Date of Initial Exercise RX and Referring Provider   Date  10/03/17    Referring Provider  Dr. Melvyn Novas       Recumbant Bike   Level  2    Watts  20    Minutes  17      NuStep   Level  2    Minutes  17    METs  1.5      Track   Laps  10    Minutes  17      Prescription Details   Frequency (times per week)  2    Duration  Progress to 45 minutes of aerobic exercise without signs/symptoms of physical distress      Intensity   THRR 40-80% of Max Heartrate  60-120    Ratings of Perceived Exertion  11-13    Perceived Dyspnea  0-4  Progression   Progression  Continue progressive overload as per policy without signs/symptoms or physical distress.      Resistance Training   Training Prescription  Yes    Weight  blue bands    Reps  10-15       Perform Capillary Blood Glucose checks as needed.  Exercise Prescription Changes: Exercise Prescription Changes    Row Name 10/08/17 1200 10/10/17 1300 10/15/17 1200 10/17/17 1200 10/29/17 1237     Response to Exercise   Blood Pressure (Admit)  100/60  120/56  110/60  110/58  130/58   Blood Pressure (Exercise)  152/74  110/70  140/50  120/60  130/70   Blood Pressure (Exit)  126/62  114/76  102/70  106/58  130/74   Heart Rate (Admit)  64 bpm  78 bpm  72 bpm  76 bpm  70 bpm   Heart Rate (Exercise)  80 bpm  80 bpm  87 bpm  87 bpm  91 bpm   Heart Rate (Exit)  70 bpm  73 bpm  64 bpm  72 bpm  70 bpm   Oxygen Saturation (Admit)  93 %  95 %  92 %  93 %  95 %   Oxygen Saturation (Exercise)  93 %  93 %  94 %  94 %  92 %   Oxygen Saturation (Exit)  92 %  90 %  92 %  93 %  92 %   Rating of Perceived Exertion (Exercise)  '13  13  13  13  13   ' Perceived Dyspnea (Exercise)  0  1  0  0  1   Duration  Progress to 45 minutes of aerobic exercise without signs/symptoms of physical distress  Progress to 45 minutes of aerobic exercise without signs/symptoms of physical distress  Progress to 45 minutes of aerobic exercise without signs/symptoms of physical distress  Continue with 45 min of aerobic exercise without signs/symptoms of physical distress.  Continue  with 45 min of aerobic exercise without signs/symptoms of physical distress.   Intensity  Other (comment) 40-80% of HRR  Other (comment) 40-80% of HRR  THRR unchanged  -  -     Progression   Progression  Continue to progress workloads to maintain intensity without signs/symptoms of physical distress.  Continue to progress workloads to maintain intensity without signs/symptoms of physical distress.  Continue to progress workloads to maintain intensity without signs/symptoms of physical distress.  Continue to progress workloads to maintain intensity without signs/symptoms of physical distress.  Continue to progress workloads to maintain intensity without signs/symptoms of physical distress.     Resistance Training   Training Prescription  Yes  Yes  Yes  Yes  Yes   Weight  blue bands  blue bands  blue bands  blue bands  blue bands   Reps  10-15  10-15  10-15  10-15  10-15   Time  10 Minutes  10 Minutes  10 Minutes  10 Minutes  10 Minutes     Interval Training   Interval Training  -  -  -  No  No     Recumbant Bike   Level  2  -  1  2  2.5   Minutes  17  -  '17  17  17     ' NuStep   Level  '2  2  3  ' -  4   Minutes  '17  17  17  ' -  17   METs  1.7  1.7  1.7  -  -     Track   Laps  '13  13  13  10  13   ' Minutes  '17  17  17  17  17   ' Row Name 11/05/17 1200 11/07/17 1200 11/12/17 1200 11/14/17 1200 11/19/17 1230     Response to Exercise   Blood Pressure (Admit)  108/56  104/66  122/76  110/54  108/50   Blood Pressure (Exercise)  138/60  138/70  - met with nutritionist the entire session, didnt exercise  132/70  132/72   Blood Pressure (Exit)  104/70  130/80  124/64  114/60  106/54   Heart Rate (Admit)  69 bpm  77 bpm  82 bpm  73 bpm  79 bpm   Heart Rate (Exercise)  89 bpm  95 bpm  -  92 bpm  92 bpm   Heart Rate (Exit)  75 bpm  71 bpm  73 bpm  74 bpm  77 bpm   Oxygen Saturation (Admit)  91 %  93 %  96 %  92 %  97 %   Oxygen Saturation (Exercise)  91 %  93 %  -  92 %  93 %   Oxygen Saturation  (Exit)  92 %  93 %  93 %  93 %  92 %   Rating of Perceived Exertion (Exercise)  13  15  -  13  13   Perceived Dyspnea (Exercise)  3  0  -  1  1   Duration  Continue with 45 min of aerobic exercise without signs/symptoms of physical distress.  Continue with 45 min of aerobic exercise without signs/symptoms of physical distress.  Progress to 45 minutes of aerobic exercise without signs/symptoms of physical distress  Progress to 45 minutes of aerobic exercise without signs/symptoms of physical distress  Progress to 45 minutes of aerobic exercise without signs/symptoms of physical distress   Intensity  THRR unchanged  THRR unchanged  THRR unchanged  THRR unchanged  THRR unchanged     Progression   Progression  Continue to progress workloads to maintain intensity without signs/symptoms of physical distress.  Continue to progress workloads to maintain intensity without signs/symptoms of physical distress.  Continue to progress workloads to maintain intensity without signs/symptoms of physical distress.  Continue to progress workloads to maintain intensity without signs/symptoms of physical distress.  Continue to progress workloads to maintain intensity without signs/symptoms of physical distress.     Resistance Training   Training Prescription  Yes  Yes  Yes  Yes  Yes   Weight  blue bands  blue bands  blue bands  blue bands  blue bands   Reps  10-15  10-15  10-15  10-15  10-15   Time  10 Minutes  10 Minutes  10 Minutes  10 Minutes  10 Minutes     Interval Training   Interval Training  No  No  No  No  No     Recumbant Bike   Level  3  -  -  3  3   Minutes  17  -  -  17  17     NuStep   Level  5  5  -  5  5   Minutes  17  17  -  17  17   METs  2.5  3.2  -  2.5  2.2     Track   Laps  11  15  -  13  12   Minutes  17  17  -  17  49   Row Name 11/21/17 1300             Response to Exercise   Blood Pressure (Admit)  100/52       Blood Pressure (Exercise)  154/72       Blood Pressure (Exit)   104/60       Heart Rate (Admit)  71 bpm       Heart Rate (Exercise)  87 bpm       Heart Rate (Exit)  60 bpm       Oxygen Saturation (Admit)  94 %       Oxygen Saturation (Exercise)  92 %       Oxygen Saturation (Exit)  92 %       Rating of Perceived Exertion (Exercise)  13       Perceived Dyspnea (Exercise)  1       Duration  Progress to 45 minutes of aerobic exercise without signs/symptoms of physical distress       Intensity  THRR unchanged         Progression   Progression  Continue to progress workloads to maintain intensity without signs/symptoms of physical distress.         Resistance Training   Training Prescription  Yes       Weight  blue bands       Reps  10-15       Time  10 Minutes         Interval Training   Interval Training  No         Recumbant Bike   Level  3.5       Minutes  17         Track   Laps  11       Minutes  17          Exercise Comments:   Exercise Goals and Review:   Exercise Goals Re-Evaluation : Exercise Goals Re-Evaluation    Row Name 10/17/17 0943 11/04/17 1633 11/21/17 0755         Exercise Goal Re-Evaluation   Exercise Goals Review  Increase Physical Activity;Increase Strength and Stamina;Able to understand and use Dyspnea scale;Able to understand and use rate of perceived exertion (RPE) scale;Knowledge and understanding of Target Heart Rate Range (THRR);Understanding of Exercise Prescription  Increase Physical Activity;Increase Strength and Stamina;Able to understand and use Dyspnea scale;Able to understand and use rate of perceived exertion (RPE) scale;Knowledge and understanding of Target Heart Rate Range (THRR);Understanding of Exercise Prescription  Increase Strength and Stamina;Able to understand and use Dyspnea scale;Increase Physical Activity;Able to understand and use rate of perceived exertion (RPE) scale;Knowledge and understanding of Target Heart Rate Range (THRR);Understanding of Exercise Prescription     Comments   Patient has only completed three days of rehab. Will monitor and progress as able.   Patient is slowly progressing. Walking 10-13 laps (200 ft. each) in 15 minutes. Will cont. to progress as able.   Patient is slowly progressing. Walking 10-13 laps (200 ft. each) in 15 minutes. Will cont. to progress as able.      Expected Outcomes  Through exercise at home and at rehab, patient will increase strength stamina and be able to perform ADL's easier.   Through exercise at rehab and at home, patient will increase strength and stamina making ADL's easier to perform. Patient will also have a  better understanding of safe exercise and what they are capable to do outside of clinical supervision.  Through exercise at rehab and at home, patient will increase strength and stamina making ADL's easier to perform. Patient will also have a better understanding of safe exercise and what they are capable to do outside of clinical supervision.        Discharge Exercise Prescription (Final Exercise Prescription Changes): Exercise Prescription Changes - 11/21/17 1300      Response to Exercise   Blood Pressure (Admit)  100/52    Blood Pressure (Exercise)  154/72    Blood Pressure (Exit)  104/60    Heart Rate (Admit)  71 bpm    Heart Rate (Exercise)  87 bpm    Heart Rate (Exit)  60 bpm    Oxygen Saturation (Admit)  94 %    Oxygen Saturation (Exercise)  92 %    Oxygen Saturation (Exit)  92 %    Rating of Perceived Exertion (Exercise)  13    Perceived Dyspnea (Exercise)  1    Duration  Progress to 45 minutes of aerobic exercise without signs/symptoms of physical distress    Intensity  THRR unchanged      Progression   Progression  Continue to progress workloads to maintain intensity without signs/symptoms of physical distress.      Resistance Training   Training Prescription  Yes    Weight  blue bands    Reps  10-15    Time  10 Minutes      Interval Training   Interval Training  No      Recumbant Bike    Level  3.5    Minutes  17      Track   Laps  11    Minutes  17       Nutrition:  Target Goals: Understanding of nutrition guidelines, daily intake of sodium <1546m, cholesterol <2019m calories 30% from fat and 7% or less from saturated fats, daily to have 5 or more servings of fruits and vegetables.  Biometrics:    Nutrition Therapy Plan and Nutrition Goals: Nutrition Therapy & Goals - 11/12/17 1229      Nutrition Therapy   Diet  Carb Modified, Heart Healthy      Personal Nutrition Goals   Nutrition Goal  Identify food quantities necessary to achieve wt loss of  -2# per week to a goal wt loss of 6-24 lb at graduation from pulmonary rehab.    Personal Goal #2  CBG's in the normal range or as close to normal as is safely possible.      Intervention Plan   Intervention  Prescribe, educate and counsel regarding individualized specific dietary modifications aiming towards targeted core components such as weight, hypertension, lipid management, diabetes, heart failure and other comorbidities.;Nutrition handout(s) given to patient. 1500-1800 kcal, 5-day menu ideas    Expected Outcomes  Short Term Goal: Understand basic principles of dietary content, such as calories, fat, sodium, cholesterol and nutrients.;Long Term Goal: Adherence to prescribed nutrition plan.       Nutrition Discharge: Rate Your Plate Scores: Nutrition Assessments - 11/12/17 1229      Rate Your Plate Scores   Pre Score  57       Nutrition Goals Re-Evaluation:   Nutrition Goals Discharge (Final Nutrition Goals Re-Evaluation):   Psychosocial: Target Goals: Acknowledge presence or absence of significant depression and/or stress, maximize coping skills, provide positive support system. Participant is able to verbalize types and ability to use  techniques and skills needed for reducing stress and depression.  Initial Review & Psychosocial Screening: Initial Psych Review & Screening - 09/27/17 1305       Initial Review   Current issues with  None Identified      Family Dynamics   Good Support System?  Yes      Barriers   Psychosocial barriers to participate in program  There are no identifiable barriers or psychosocial needs.      Screening Interventions   Interventions  Encouraged to exercise       Quality of Life Scores:   PHQ-9: Recent Review Flowsheet Data    Depression screen [facility] 2/9 09/27/2017 06/25/2017   Decreased Interest 0 0   Down, Depressed, Hopeless 0 0   PHQ - 2 Score 0 0     Interpretation of Total Score  Total Score Depression Severity:  1-4 = Minimal depression, 5-9 = Mild depression, 10-14 = Moderate depression, 15-19 = Moderately severe depression, 20-27 = Severe depression   Psychosocial Evaluation and Intervention: Psychosocial Evaluation - 09/27/17 1305      Psychosocial Evaluation & Interventions   Interventions  Encouraged to exercise with the program and follow exercise prescription    Expected Outcomes  patient will remain free from psychosocial barriers to participation in pulmonary rehab    Continue Psychosocial Services   No Follow up required       Psychosocial Re-Evaluation: Psychosocial Re-Evaluation    [place] Name 10/14/17 1631 11/04/17 1736 11/28/17 0734         Psychosocial Re-Evaluation   Current issues with  None Identified  None Identified  None Identified     Comments  -  patient has attended 90% of exercise sessions since admission  patient has attended 90% of exercise sessions since admission     Expected Outcomes  patient will remain free from psychosocial barriers to exercise in pulmonary rehab  patient will remain free from psychosocial barriers to exercise in pulmonary rehab  patient will remain free from psychosocial barriers to exercise in pulmonary rehab     Continue Psychosocial Services   No Follow up required  No Follow up required  No Follow up required        Psychosocial Discharge (Final Psychosocial  Re-Evaluation): Psychosocial Re-Evaluation - 11/28/17 0734      Psychosocial Re-Evaluation   Current issues with  None Identified    Comments  patient has attended 90% of exercise sessions since admission    Expected Outcomes  patient will remain free from psychosocial barriers to exercise in pulmonary rehab    Continue Psychosocial Services   No Follow up required       Education: Education Goals: Education classes will be provided on a weekly basis, covering required topics. Participant will state understanding/return demonstration of topics presented.  Learning Barriers/Preferences:   Education Topics: Risk Factor Reduction:  -Group instruction that is supported by a PowerPoint presentation. Instructor discusses the definition of a risk factor, different risk factors for pulmonary disease, and how the heart and lungs work together.     Nutrition for Pulmonary Patient:  -Group instruction provided by PowerPoint slides, verbal discussion, and written materials to support subject matter. The instructor gives an explanation and review of healthy diet recommendations, which includes a discussion on weight management, recommendations for fruit and vegetable consumption, as well as protein, fluid, caffeine, fiber, sodium, sugar, and alcohol. Tips for eating when patients are short of breath are discussed.   Pursed Lip Breathing:  -  Group instruction that is supported by demonstration and informational handouts. Instructor discusses the benefits of pursed lip and diaphragmatic breathing and detailed demonstration on how to preform both.     Oxygen Safety:  -Group instruction provided by PowerPoint, verbal discussion, and written material to support subject matter. There is an overview of "What is Oxygen" and "Why do we need it".  Instructor also reviews how to create a safe environment for oxygen use, the importance of using oxygen as prescribed, and the risks of noncompliance. There is a  brief discussion on traveling with oxygen and resources the patient may utilize.   Oxygen Equipment:  -Group instruction provided by [facility] Staff utilizing handouts, written materials, and equipment demonstrations.   PULMONARY REHAB CHRONIC OBSTRUCTIVE PULMONARY DISEASE from 11/21/2017 in [place]  Date  10/31/17  Educator  [facility]  Instruction Review Code  2- meets goals/outcomes      Signs and Symptoms:  -Group instruction provided by written material and verbal discussion to support subject matter. Warning signs and symptoms of infection, stroke, and heart attack are reviewed and when to call the physician/911 reinforced. Tips for preventing the spread of infection discussed.   Advanced Directives:  -Group instruction provided by verbal instruction and written material to support subject matter. Instructor reviews Advanced Directive laws and proper instruction for filling out document.   Pulmonary Video:  -Group video education that reviews the importance of medication and oxygen compliance, exercise, good nutrition, pulmonary hygiene, and pursed lip and diaphragmatic breathing for the pulmonary patient.   Exercise for the Pulmonary Patient:  -Group instruction that is supported by a PowerPoint presentation. Instructor discusses benefits of exercise, core components of exercise, frequency, duration, and intensity of an exercise routine, importance of utilizing pulse oximetry during exercise, safety while exercising, and options of places to exercise outside of rehab.     PULMONARY REHAB CHRONIC OBSTRUCTIVE PULMONARY DISEASE from 11/21/2017 in [place]  Date  10/17/17  Educator  Cloyde Reams  Instruction Review Code  2- meets goals/outcomes      Pulmonary Medications:  -Verbally interactive group education provided by instructor with focus on inhaled medications and proper administration.   Anatomy and  Physiology of the Respiratory System and Intimacy:  -Group instruction provided by PowerPoint, verbal discussion, and written material to support subject matter. Instructor reviews respiratory cycle and anatomical components of the respiratory system and their functions. Instructor also reviews differences in obstructive and restrictive respiratory diseases with examples of each. Intimacy, Sex, and Sexuality differences are reviewed with a discussion on how relationships can change when diagnosed with pulmonary disease. Common sexual concerns are reviewed.   PULMONARY REHAB CHRONIC OBSTRUCTIVE PULMONARY DISEASE from 11/21/2017 in [place]  Date  11/21/17  Educator  RN  Instruction Review Code  2- meets goals/outcomes      MD DAY -A group question and answer session with a medical doctor that allows participants to ask questions that relate to their pulmonary disease state.   OTHER EDUCATION -Group or individual verbal, written, or video instructions that support the educational goals of the pulmonary rehab program.   PULMONARY REHAB CHRONIC OBSTRUCTIVE PULMONARY DISEASE from 11/21/2017 in [place]  Date  10/10/17 [[facility]]  Educator  RD  Instruction Review Code  1- Verbalizes Understanding      Knowledge Questionnaire Score: Knowledge Questionnaire Score - 10/08/17 1514      Knowledge Questionnaire Score  Pre Score  10/13       Core Components/Risk Factors/Patient Goals at Admission: Personal Goals and Risk Factors at Admission - 09/27/17 1304      Core Components/Risk Factors/Patient Goals on Admission    Weight Management  Yes;Obesity    Intervention  Weight Management: Develop a combined nutrition and exercise program designed to reach desired caloric intake, while maintaining appropriate intake of nutrient and fiber, sodium and fats, and appropriate energy expenditure required for the weight  goal.;Obesity: Provide education and appropriate resources to help participant work on and attain dietary goals.;Weight Management: Provide education and appropriate resources to help participant work on and attain dietary goals.;Weight Management/Obesity: Establish reasonable short term and long term weight goals.    Expected Outcomes  Short Term: Continue to assess and modify interventions until short term weight is achieved;Long Term: Adherence to nutrition and physical activity/exercise program aimed toward attainment of established weight goal;Weight Gain: Understanding of general recommendations for a high calorie, high protein meal plan that promotes weight gain by distributing calorie intake throughout the day with the consumption for 4-5 meals, snacks, and/or supplements;Understanding of distribution of calorie intake throughout the day with the consumption of 4-5 meals/snacks;Understanding recommendations for meals to include 15-35% energy as protein, 25-35% energy from fat, 35-60% energy from carbohydrates, less than 289m of dietary cholesterol, 20-35 gm of total fiber daily;Weight Loss: Understanding of general recommendations for a balanced deficit meal plan, which promotes 1-2 lb weight loss per week and includes a negative energy balance of [phone] kcal/d    Improve shortness of breath with ADL's  Yes    Intervention  Provide education, individualized exercise plan and daily activity instruction to help decrease symptoms of SOB with activities of daily living.    Expected Outcomes  Short Term: Achieves a reduction of symptoms when performing activities of daily living.    Develop more efficient breathing techniques such as purse lipped breathing and diaphragmatic breathing; and practicing self-pacing with activity  Yes    Intervention  Provide education, demonstration and support about specific breathing techniuqes utilized for more efficient breathing. Include techniques such as pursed lipped  breathing, diaphragmatic breathing and self-pacing activity.    Expected Outcomes  Short Term: Participant will be able to demonstrate and use breathing techniques as needed throughout daily activities.    Diabetes  Yes    Intervention  Provide education about signs/symptoms and action to take for hypo/hyperglycemia.    Expected Outcomes  Short Term: Participant verbalizes understanding of the signs/symptoms and immediate care of hyper/hypoglycemia, proper foot care and importance of medication, aerobic/resistive exercise and nutrition plan for blood glucose control.;Long Term: Attainment of HbA1C < 7%.    Heart Failure  Yes    Intervention  Provide a combined exercise and nutrition program that is supplemented with education, support and counseling about heart failure. Directed toward relieving symptoms such as shortness of breath, decreased exercise tolerance, and extremity edema.    Expected Outcomes  Improve functional capacity of life;Short term: Attendance in program 2-3 days a week with increased exercise capacity. Reported lower sodium intake. Reported increased fruit and vegetable intake. Reports medication compliance.;Short term: Daily weights obtained and reported for increase. Utilizing diuretic protocols set by physician.;Long term: Adoption of self-care skills and reduction of barriers for early signs and symptoms recognition and intervention leading to self-care maintenance.       Core Components/Risk Factors/Patient Goals Review:  Goals and Risk Factor Review    Row Name 10/14/17 1630 11/04/17 1730  11/28/17 0731         Core Components/Risk Factors/Patient Goals Review   Personal Goals Review  Weight Management/Obesity;Improve shortness of breath with ADL's;Develop more efficient breathing techniques such as purse lipped breathing and diaphragmatic breathing and practicing self-pacing with activity.;Diabetes;Heart Failure  Weight Management/Obesity;Improve shortness of breath with  ADL's;Develop more efficient breathing techniques such as purse lipped breathing and diaphragmatic breathing and practicing self-pacing with activity.;Diabetes;Heart Failure  Weight Management/Obesity;Improve shortness of breath with ADL's;Develop more efficient breathing techniques such as purse lipped breathing and diaphragmatic breathing and practicing self-pacing with activity.;Diabetes;Heart Failure     Review  patient has only attended several session since admission and it is too soon to evaluate progression towards pulmonary rehab goals  patient is slowly progressing towards pulmonary rehab goals. He continues to be limited by ankle pain from a previous surgery that according to patient was "botched". Patient states that current orthopedist states only surgery will correct the issue but the patient is unsure if he is physically able to enure another surgery at this time. He prefers to recondition himself at pulmonary rehab prior to considering another surgery. This pain limits his ability to work to his maximum cardiopulmonary fitness ability. He continues to be coached on the importants of maintaining a diabetic diet and taking responsibility of having the correct working equiptment/supplies at each exercise session. Working with patient to identify barriers. Patients weight remains stable although his goal is to begin a significant weightloss lifestyle. Will continue to coach patient with goals and help him identify barriers to obtaining goals.  Patient is slowly progressing towards pulmonary rehab goals. He continues to be limited by ankle pain from a previous surgery. This pain limits his ability to work to his maximum cardiopulmonary fitness ability. Patient is doing much better on maintaining a  diabetic diet and taking responsibility of having the correct working equiptment/supplies at each exercise session. Patients weight remains stable although his goal is to begin a significant weightloss  lifestyle. Will continue to coach patient with goals and help him identify barriers to obtaining goals.     Expected Outcomes  see admission outcomes  see admission outcomes  see admission outcomes        Core Components/Risk Factors/Patient Goals at Discharge (Final Review):  Goals and Risk Factor Review - 11/28/17 0731      Core Components/Risk Factors/Patient Goals Review   Personal Goals Review  Weight Management/Obesity;Improve shortness of breath with ADL's;Develop more efficient breathing techniques such as purse lipped breathing and diaphragmatic breathing and practicing self-pacing with activity.;Diabetes;Heart Failure    Review  Patient is slowly progressing towards pulmonary rehab goals. He continues to be limited by ankle pain from a previous surgery. This pain limits his ability to work to his maximum cardiopulmonary fitness ability. Patient is doing much better on maintaining a  diabetic diet and taking responsibility of having the correct working equiptment/supplies at each exercise session. Patients weight remains stable although his goal is to begin a significant weightloss lifestyle. Will continue to coach patient with goals and help him identify barriers to obtaining goals.    Expected Outcomes  see admission outcomes       ITP Comments:   Comments: Patient has attended 12 sessions since admission to pulmonary rehab.

## 2017-12-05 ENCOUNTER — Telehealth (HOSPITAL_COMMUNITY): Payer: Self-pay | Admitting: Family Medicine

## 2017-12-05 ENCOUNTER — Encounter (HOSPITAL_COMMUNITY): Payer: Federal, State, Local not specified - PPO

## 2017-12-10 ENCOUNTER — Encounter (HOSPITAL_COMMUNITY)
Admission: RE | Admit: 2017-12-10 | Discharge: 2017-12-10 | Disposition: A | Discharge status: Home / Self Care | Payer: Federal, State, Local not specified - PPO | Source: Ambulatory Visit | Attending: Internal Medicine | Admitting: Internal Medicine

## 2017-12-10 VITALS — Wt 248.0 lb

## 2017-12-10 DIAGNOSIS — Z713 Dietary counseling and surveillance: Secondary | ICD-10-CM | POA: Insufficient documentation

## 2017-12-10 DIAGNOSIS — J449 Chronic obstructive pulmonary disease, unspecified: Secondary | ICD-10-CM

## 2017-12-10 DIAGNOSIS — E119 Type 2 diabetes mellitus without complications: Secondary | ICD-10-CM | POA: Diagnosis not present

## 2017-12-10 NOTE — Progress Notes (Signed)
Daily Session Note  Patient Details  Name: Jeremiah [name] MRN: [id number] Date of Birth: March 02, 1946 Referring Provider:     Pulmonary Rehab Walk Test from 10/03/2017 in [place]  Referring Provider  Dr. Melvyn Novas      Encounter Date: 12/10/2017  Check In: Session Check In - 12/10/17 1030      Check-In   Location  [facility]    Staff Present  Rosebud Poles, RN, Luisa Hart, RN, BSN;Molly diVincenzo, MS, ACSM RCEP, Exercise Physiologist;Trinka Keshishyan Ysidro Evert, RN    Supervising physician immediately available to respond to emergencies  [facility] immediately available    Physician(s)  Dr. Eliseo Squires    Medication changes reported      No    Fall or balance concerns reported     No    Tobacco Cessation  No Change    Warm-up and Cool-down  Performed as group-led instruction    Resistance Training Performed  Yes    VAD Patient?  No      Pain Assessment   Currently in Pain?  No/denies    Multiple Pain Sites  No       Capillary Blood Glucose: No results found for this or any previous visit (from the past 24 hour(s)). POCT Glucose - 12/10/17 1423      POCT Blood Glucose   Pre-Exercise  196 mg/dL    Post-Exercise  218 mg/dL      Exercise Prescription Changes - 12/10/17 1400      Response to Exercise   Blood Pressure (Admit)  130/80    Blood Pressure (Exercise)  138/62    Blood Pressure (Exit)  112/60    Heart Rate (Admit)  75 bpm    Heart Rate (Exercise)  92 bpm    Heart Rate (Exit)  78 bpm    Oxygen Saturation (Admit)  90 %    Oxygen Saturation (Exercise)  92 %    Oxygen Saturation (Exit)  89 %    Rating of Perceived Exertion (Exercise)  13    Perceived Dyspnea (Exercise)  0    Duration  Progress to 45 minutes of aerobic exercise without signs/symptoms of physical distress    Intensity  THRR unchanged      Progression   Progression  Continue to progress workloads to maintain intensity without signs/symptoms of physical distress.       Resistance Training   Training Prescription  Yes    Weight  blue bands    Reps  10-15    Time  10 Minutes      Interval Training   Interval Training  No      Recumbant Bike   Level  3.5    Minutes  17      NuStep   Level  5    Minutes  17    METs  1.9      Track   Laps  12    Minutes  17       Social History   Tobacco Use  Smoking Status Former Smoker  . Packs/day: 0.50  . Years: 60.00  . Pack years: 30.00  . Types: Cigarettes  . Last attempt to quit: 12/15/2016  . Years since quitting: 0.9  Smokeless Tobacco Never Used    Goals Met:  Exercise tolerated well No report of cardiac concerns or symptoms Strength training completed today  Goals Unmet:  Not Applicable  Comments: Service time is from 1030 to 1205  Dr. Rush Farmer is Medical Director for Pulmonary Rehab at [facility].

## 2017-12-12 ENCOUNTER — Encounter (HOSPITAL_COMMUNITY)
Admission: RE | Admit: 2017-12-12 | Discharge: 2017-12-12 | Disposition: A | Discharge status: Home / Self Care | Payer: Federal, State, Local not specified - PPO | Source: Ambulatory Visit | Attending: Internal Medicine | Admitting: Internal Medicine

## 2017-12-12 VITALS — Wt 250.7 lb

## 2017-12-12 DIAGNOSIS — E119 Type 2 diabetes mellitus without complications: Secondary | ICD-10-CM | POA: Diagnosis not present

## 2017-12-12 DIAGNOSIS — Z713 Dietary counseling and surveillance: Secondary | ICD-10-CM | POA: Diagnosis not present

## 2017-12-12 DIAGNOSIS — J449 Chronic obstructive pulmonary disease, unspecified: Secondary | ICD-10-CM

## 2017-12-12 NOTE — Progress Notes (Signed)
I have reviewed a Home Exercise Prescription with Jeremiah [name] . Jeremiah [name] is currently exercising at home.  The patient was advised to walk 3 days a week for 45 minutes.  Jeremiah [name] and I discussed how to progress their exercise prescription.  The patient stated that their goals were lose about 60 pounds, increase walking endurance, partake in more outings where there is walking involved.  The patient stated that they understand the exercise prescription.  We reviewed exercise guidelines, target heart rate during exercise, oxygen use, weather, home pulse oximeter, endpoints for exercise, and goals.  Patient is encouraged to come to me with any questions. I will continue to follow up with the patient to assist them with progression and safety.

## 2017-12-12 NOTE — Progress Notes (Signed)
Daily Session Note  Patient Details  Name: Jeremiah [name] MRN: [id number] Date of Birth: 03/10/1946 Referring Provider:     Pulmonary Rehab Walk Test from 10/03/2017 in [facility]  Referring Provider  Dr. Wert      Encounter Date: 12/12/2017  Check In: Session Check In - 12/12/17 1058      Check-In   Location  [facility]    Staff Present  Lisa Hughes, RN;Molly diVincenzo, MS, ACSM RCEP, Exercise Physiologist;Portia Payne, RN, BSN;Joan Behrens, RN, BSN    Supervising physician immediately available to respond to emergencies  [facility] immediately available    Physician(s)  [place]her[place]    Medication changes reported      No    Fall or balance concerns reported     No    Tobacco Cessation  No Change    Warm-up and Cool-down  Performed as group-led instruction    Resistance Training Performed  Yes    VAD Patient?  No      Pain Assessment   Currently in Pain?  No/denies    Multiple Pain Sites  No       Capillary Blood Glucose: No results found for this or any previous visit (from the past 24 hour(s)). POCT Glucose - 12/12/17 1242      POCT Blood Glucose   Pre-Exercise  236 mg/dL    Post-Exercise  240 mg/dL      Exercise Prescription Changes - 12/12/17 1200      Response to Exercise   Blood Pressure (Admit)  114/52    Blood Pressure (Exercise)  142/80    Blood Pressure (Exit)  100/64    Heart Rate (Admit)  68 bpm    Heart Rate (Exercise)  92 bpm    Heart Rate (Exit)  71 bpm    Oxygen Saturation (Admit)  92 %    Oxygen Saturation (Exercise)  92 %    Oxygen Saturation (Exit)  93 %    Rating of Perceived Exertion (Exercise)  13    Perceived Dyspnea (Exercise)  0    Duration  Progress to 45 minutes of aerobic exercise without signs/symptoms of physical distress    Intensity  THRR unchanged      Progression   Progression  Continue to progress workloads to maintain intensity without signs/symptoms of physical distress.       Resistance Training   Training Prescription  Yes    Weight  blue bands    Reps  10-15    Time  10 Minutes      Interval Training   Interval Training  No      Recumbant Bike   Level  4    Minutes  17      NuStep   Level  5    Minutes  17    METs  2.4       Social History   Tobacco Use  Smoking Status Former Smoker  . Packs/day: 0.50  . Years: 60.00  . Pack years: 30.00  . Types: Cigarettes  . Last attempt to quit: 12/15/2016  . Years since quitting: 0.9  Smokeless Tobacco Never Used    Goals Met:  Exercise tolerated well Strength training completed today  Goals Unmet:  Not Applicable  Comments: Service time is from 1030 to 1230    Dr. Wesam G. Yacoub is Medical Director for Pulmonary Rehab at [facility]. 

## 2017-12-17 ENCOUNTER — Encounter (HOSPITAL_COMMUNITY): Payer: Federal, State, Local not specified - PPO

## 2017-12-17 ENCOUNTER — Telehealth (HOSPITAL_COMMUNITY): Payer: Self-pay | Admitting: *Deleted

## 2017-12-19 ENCOUNTER — Encounter (HOSPITAL_COMMUNITY)
Admission: RE | Admit: 2017-12-19 | Discharge: 2017-12-19 | Disposition: A | Discharge status: Home / Self Care | Payer: Federal, State, Local not specified - PPO | Source: Ambulatory Visit | Attending: Internal Medicine | Admitting: Internal Medicine

## 2017-12-19 VITALS — Wt 250.7 lb

## 2017-12-19 DIAGNOSIS — J449 Chronic obstructive pulmonary disease, unspecified: Secondary | ICD-10-CM

## 2017-12-19 DIAGNOSIS — Z713 Dietary counseling and surveillance: Secondary | ICD-10-CM | POA: Diagnosis not present

## 2017-12-19 DIAGNOSIS — E119 Type 2 diabetes mellitus without complications: Secondary | ICD-10-CM | POA: Diagnosis not present

## 2017-12-19 NOTE — Progress Notes (Signed)
Daily Session Note  Patient Details  Name: Jeremiah [name] MRN: [id number] Date of Birth: [birth year] Referring Provider:     Pulmonary Rehab Walk Test from 10/03/2017 in [place]  Referring Provider  Dr. Melvyn Novas      Encounter Date: 12/19/2017  Check In: Session Check In - 12/19/17 1053      Check-In   Location  [facility]    Staff Present  Trish Fountain, RN, BSN;Marlisha Vanwyk Ysidro Evert, RN;Molly diVincenzo, MS, ACSM RCEP, Exercise Physiologist;Joan Leonia Reeves, RN, BSN    Supervising physician immediately available to respond to emergencies  [facility] immediately available    Physician(s)  Dr. Cathlean Sauer    Medication changes reported      No    Fall or balance concerns reported     No    Tobacco Cessation  No Change    Warm-up and Cool-down  Performed as group-led instruction    Resistance Training Performed  Yes    VAD Patient?  No      Pain Assessment   Currently in Pain?  No/denies    Multiple Pain Sites  No       Capillary Blood Glucose: No results found for this or any previous visit (from the past 24 hour(s)). POCT Glucose - 12/19/17 1228      POCT Blood Glucose   Pre-Exercise  204 mg/dL    Post-Exercise  116 mg/dL      Exercise Prescription Changes - 12/19/17 1200      Response to Exercise   Blood Pressure (Admit)  118/60    Blood Pressure (Exercise)  124/56    Blood Pressure (Exit)  100/54    Heart Rate (Admit)  76 bpm    Heart Rate (Exercise)  90 bpm    Heart Rate (Exit)  82 bpm    Oxygen Saturation (Admit)  91 %    Oxygen Saturation (Exercise)  93 %    Oxygen Saturation (Exit)  93 %    Rating of Perceived Exertion (Exercise)  13    Perceived Dyspnea (Exercise)  0    Duration  Progress to 45 minutes of aerobic exercise without signs/symptoms of physical distress    Intensity  THRR unchanged      Progression   Progression  Continue to progress workloads to maintain intensity without signs/symptoms of physical distress.       Resistance Training   Training Prescription  Yes    Weight  blue bands    Reps  10-15    Time  10 Minutes      Interval Training   Interval Training  No      NuStep   Level  5    Minutes  17    METs  2.4      Track   Laps  15    Minutes  17       Social History   Tobacco Use  Smoking Status Former Smoker  . Packs/day: 0.50  . Years: 60.00  . Pack years: 30.00  . Types: Cigarettes  . Last attempt to quit: 12/15/2016  . Years since quitting: 1.0  Smokeless Tobacco Never Used    Goals Met:  Exercise tolerated well No report of cardiac concerns or symptoms Strength training completed today  Goals Unmet:  Not Applicable  Comments: Service time is from 1030 to 1215    Dr. Rush Farmer is Medical Director for Pulmonary Rehab at [facility].

## 2017-12-24 ENCOUNTER — Encounter (HOSPITAL_COMMUNITY)
Admission: RE | Admit: 2017-12-24 | Discharge: 2017-12-24 | Disposition: A | Discharge status: Home / Self Care | Payer: Federal, State, Local not specified - PPO | Source: Ambulatory Visit | Attending: Family Medicine | Admitting: Family Medicine

## 2017-12-24 VITALS — Wt 251.5 lb

## 2017-12-24 DIAGNOSIS — E119 Type 2 diabetes mellitus without complications: Secondary | ICD-10-CM | POA: Diagnosis not present

## 2017-12-24 DIAGNOSIS — J449 Chronic obstructive pulmonary disease, unspecified: Secondary | ICD-10-CM

## 2017-12-24 DIAGNOSIS — Z713 Dietary counseling and surveillance: Secondary | ICD-10-CM | POA: Diagnosis not present

## 2017-12-24 NOTE — Progress Notes (Signed)
Daily Session Note  Patient Details  Name: Jeremiah [name] MRN: [id number] Date of Birth: 1946/09/24 Referring Provider:     Pulmonary Rehab Walk Test from 10/03/2017 in [place]  Referring Provider  Dr. Melvyn Novas      Encounter Date: 12/24/2017  Check In: Session Check In - 12/24/17 1052      Check-In   Location  [facility]    Staff Present  Trish Fountain, RN, BSN;Lisa Ysidro Evert, RN;Sanae Willetts, MS, ACSM RCEP, Exercise Physiologist;Joan Leonia Reeves, RN, BSN    Supervising physician immediately available to respond to emergencies  [facility] immediately available    Physician(s)  Dr. Zigmund Daniel    Medication changes reported      No    Fall or balance concerns reported     No    Tobacco Cessation  No Change    Warm-up and Cool-down  Performed as group-led instruction    Resistance Training Performed  Yes    VAD Patient?  No      Pain Assessment   Currently in Pain?  No/denies    Multiple Pain Sites  No       Capillary Blood Glucose: No results found for this or any previous visit (from the past 24 hour(s)). POCT Glucose - 12/24/17 1243      POCT Blood Glucose   Pre-Exercise  196 mg/dL    Post-Exercise  136 mg/dL      Exercise Prescription Changes - 12/24/17 1200      Response to Exercise   Blood Pressure (Admit)  112/64    Blood Pressure (Exercise)  136/70    Blood Pressure (Exit)  126/64    Heart Rate (Admit)  71 bpm    Heart Rate (Exercise)  84 bpm    Heart Rate (Exit)  65 bpm    Oxygen Saturation (Admit)  92 %    Oxygen Saturation (Exercise)  92 %    Oxygen Saturation (Exit)  93 %    Rating of Perceived Exertion (Exercise)  13    Perceived Dyspnea (Exercise)  1    Duration  Progress to 45 minutes of aerobic exercise without signs/symptoms of physical distress    Intensity  THRR unchanged      Progression   Progression  Continue to progress workloads to maintain intensity without signs/symptoms of physical  distress.      Resistance Training   Training Prescription  Yes    Weight  blue bands    Reps  10-15    Time  10 Minutes      Interval Training   Interval Training  No      Recumbant Bike   Level  4    Minutes  17      NuStep   Level  5    Minutes  17    METs  2.2      Track   Laps  13    Minutes  17       Social History   Tobacco Use  Smoking Status Former Smoker  . Packs/day: 0.50  . Years: 60.00  . Pack years: 30.00  . Types: Cigarettes  . Last attempt to quit: 12/15/2016  . Years since quitting: 1.0  Smokeless Tobacco Never Used    Goals Met:  Exercise tolerated well No report of cardiac concerns or symptoms Strength training completed today  Goals Unmet:  Not Applicable  Comments: Service time is from 10:30a to 12:05p  Dr. Rush Farmer is Medical Director for Pulmonary Rehab at [facility].

## 2017-12-24 NOTE — Progress Notes (Signed)
Pulmonary Individual Treatment Plan  Patient Details  Name: Zaryan Yakubov MRN: [id number] Date of Birth: [birth year] Referring Provider:     Pulmonary Rehab Walk Test from 10/03/2017 in [place]  Referring Provider  Dr. Melvyn Novas      Initial Encounter Date:    Pulmonary Rehab Walk Test from 10/03/2017 in [place]  Date  10/03/17  Referring Provider  Dr. Melvyn Novas      Visit Diagnosis: Chronic obstructive pulmonary disease, unspecified COPD type ([place])  Patient's Home Medications on Admission:   Current Outpatient Medications:  .  aspirin 81 MG tablet, Take 1 tablet (81 mg total) by mouth daily. Hold for 5 days (Patient taking differently: Take 81 mg by mouth daily. ), Disp: 30 tablet, Rfl:  .  atorvastatin (LIPITOR) 80 MG tablet, Take 80 mg by mouth daily., Disp: , Rfl:  .  betamethasone dipropionate 0.05 % lotion, Apply topically daily. Apply to affected area daily, Disp: , Rfl:  .  bisoprolol (ZEBETA) 5 MG tablet, Take 1 tablet (5 mg total) by mouth daily., Disp: 60 tablet, Rfl: 11 .  furosemide (LASIX) 40 MG tablet, Take 1 tablet (40 mg total) by mouth daily., Disp: 90 tablet, Rfl: 3 .  glimepiride (AMARYL) 1 MG tablet, Take 1 tablet by mouth daily., Disp: , Rfl:  .  ipratropium-albuterol (DUONEB) 0.5-2.5 (3) MG/3ML SOLN, Take 3 mLs by nebulization every 4 (four) hours as needed., Disp: 360 mL, Rfl: 0 .  IRON PO, Take 1 tablet by mouth daily., Disp: , Rfl:  .  ketoconazole (NIZORAL) 2 % shampoo, AS DIRECTED, Disp: , Rfl:  .  levothyroxine (SYNTHROID, LEVOTHROID) 75 MCG tablet, Take 75 mcg by mouth daily before breakfast., Disp: , Rfl:  .  metFORMIN (GLUCOPHAGE) 1000 MG tablet, Take 1,000 mg by mouth daily with breakfast., Disp: , Rfl:  .  OXYGEN, 2.5 lpm with sleep only AHC, Disp: , Rfl:  .  pantoprazole (PROTONIX) 40 MG tablet, Take 1 tablet (40 mg total) by mouth 2 (two) times daily before a meal., Disp: 60 tablet, Rfl: 2 .   Turmeric 500 MG TABS, Take 500 mg by mouth daily., Disp: , Rfl:   Past Medical History: Past Medical History:  Diagnosis Date  . A-fib ([place])   . CAD (coronary artery disease)    a. 2006: CABG in 2006 with LIMA-LAD, SVG-OM1, and SVG-RPDA  . Chronic pain 03/21/2016  . Essential hypertension 03/21/2016  . History of prostate cancer 03/21/2016  . HLD (hyperlipidemia)   . HTN (hypertension)   . Hx of CABG 2006  . Hypercholesteremia 03/21/2016  . Hypothyroidism   . Medication management 03/21/2016  . Morbid obesity ([place]) 03/21/2016  . Myocardial infarct ([place])   . OSA (obstructive sleep apnea) 03/21/2016  . Pain in right ankle and joints of right foot 03/21/2016  . Paronychia 03/21/2016  . Primary insomnia 03/21/2016  . Prostate cancer ([place]) 2008  . Tobacco dependence 03/21/2016    Tobacco Use: Social History   Tobacco Use  Smoking Status Former Smoker  . Packs/day: 0.50  . Years: 60.00  . Pack years: 30.00  . Types: Cigarettes  . Last attempt to quit: 12/15/2016  . Years since quitting: 1.0  Smokeless Tobacco Never Used    Labs: Recent Review Scientist, physiological    Labs for ITP Cardiac and Pulmonary Rehab Latest Ref Rng & Units 05/05/2016 02/25/2017 02/25/2017   HCO3 20.0 - 28.0 mmol/L - 36.1(H) -   TCO2  0 - 100 mmol/L 26 38 33   O2SAT % - 86.0 -      Capillary Blood Glucose: Lab Results  Component Value Date   GLUCAP 138 (H) 10/29/2017   GLUCAP 236 (H) 10/29/2017   GLUCAP 118 (H) 10/17/2017   GLUCAP 163 (H) 10/08/2017   GLUCAP 196 (H) 10/08/2017   POCT Glucose    Row Name 10/08/17 1228 10/10/17 1315 10/15/17 1255 10/29/17 1238 11/05/17 1228     POCT Blood Glucose   Pre-Exercise  196 mg/dL  117 mg/dL  205 mg/dL  236 mg/dL  189 mg/dL   Post-Exercise  163 mg/dL  117 mg/dL  153 mg/dL  138 mg/dL  120 mg/dL   Row Name 11/07/17 1249 11/12/17 1200 11/14/17 1236 11/19/17 1232 11/28/17 1239     POCT Blood Glucose   Pre-Exercise  241 mg/dL  136 mg/dL  112 mg/dL  126 mg/dL  196 mg/dL    Post-Exercise  134 mg/dL  184 mg/dL  85 mg/dL given a jolly rancer before leaving  125 mg/dL  167 mg/dL   Row Name 12/10/17 1423 12/12/17 1242 12/19/17 1228         POCT Blood Glucose   Pre-Exercise  196 mg/dL  236 mg/dL  204 mg/dL     Post-Exercise  218 mg/dL  240 mg/dL  116 mg/dL        Pulmonary Assessment Scores: Pulmonary Assessment Scores    Row Name 10/03/17 1644 10/08/17 1514       ADL UCSD   ADL Phase  Entry  Entry    SOB Score total  -  34      CAT Score   CAT Score  -  16 Entry      mMRC Score   mMRC Score  2  -       Pulmonary Function Assessment: Pulmonary Function Assessment - 09/27/17 1304      Breath   Bilateral Breath Sounds  Clear;Decreased    Shortness of Breath  Yes;Limiting activity       Exercise Target Goals:    Exercise Program Goal: Individual exercise prescription set using results from initial 6 min walk test and THRR while considering  patient's activity barriers and safety.    Exercise Prescription Goal: Initial exercise prescription builds to 30-45 minutes a day of aerobic activity, 2-3 days per week.  Home exercise guidelines will be given to patient during program as part of exercise prescription that the participant will acknowledge.  Activity Barriers & Risk Stratification: Activity Barriers & Cardiac Risk Stratification - 09/27/17 1238      Activity Barriers & Cardiac Risk Stratification   Activity Barriers  Shortness of Breath;Deconditioning;Joint Problems       6 Minute Walk: 6 Minute Walk    Row Name 10/03/17 1639         6 Minute Walk   Phase  Initial     Distance  1000 feet     Walk Time  6 minutes     # of Rest Breaks  0     MPH  1.89     METS  2.45     RPE  13     Perceived Dyspnea   1     Symptoms  Yes (comment)     Comments  7/10 ankle pain     Resting HR  93 bpm     Resting BP  144/70     Resting Oxygen Saturation   93 %  Exercise Oxygen Saturation  during 6 min walk  89 %     Max Ex. HR  116  bpm     Max Ex. BP  160/72       Interval HR   1 Minute HR  94     2 Minute HR  94     3 Minute HR  104     4 Minute HR  112     5 Minute HR  116     6 Minute HR  127     Interval Heart Rate?  Yes       Interval Oxygen   Interval Oxygen?  Yes     Baseline Oxygen Saturation %  93 %     1 Minute Oxygen Saturation %  92 %     1 Minute Liters of Oxygen  0 L     2 Minute Oxygen Saturation %  89 %     2 Minute Liters of Oxygen  0 L     3 Minute Oxygen Saturation %  91 %     3 Minute Liters of Oxygen  0 L     4 Minute Oxygen Saturation %  90 %     4 Minute Liters of Oxygen  0 L     5 Minute Oxygen Saturation %  92 %     5 Minute Liters of Oxygen  0 L     6 Minute Oxygen Saturation %  91 %     6 Minute Liters of Oxygen  0 L        Oxygen Initial Assessment: Oxygen Initial Assessment - 10/03/17 1644      Initial 6 min Walk   Oxygen Used  None      Program Oxygen Prescription   Program Oxygen Prescription  None       Oxygen Re-Evaluation: Oxygen Re-Evaluation    Row Name 10/14/17 1628 11/04/17 1730 11/28/17 0730 12/23/17 1514       Program Oxygen Prescription   Program Oxygen Prescription  None  None  None  None      Home Oxygen   Home Oxygen Device  Home Concentrator;E-Tanks  Home Concentrator;E-Tanks  Home Concentrator;E-Tanks  Home Concentrator;E-Tanks    Sleep Oxygen Prescription  CPAP;Continuous  CPAP;Continuous  CPAP;Continuous  CPAP;Continuous    Liters per minute  2.5  2.5  2.5  2.5    Home Exercise Oxygen Prescription  None  None  None  None    Liters per minute  0  0  0  0    Home at Rest Exercise Oxygen Prescription  None  None  None  None    Compliance with Home Oxygen Use  No  No  No  No      Goals/Expected Outcomes   Short Term Goals  To learn and exhibit compliance with exercise, home and travel O2 prescription;To learn and understand importance of monitoring SPO2 with pulse oximeter and demonstrate accurate use of the pulse oximeter.;To learn and  understand importance of maintaining oxygen saturations>88%;To learn and demonstrate proper pursed lip breathing techniques or other breathing techniques.;To learn and demonstrate proper use of respiratory medications  To learn and exhibit compliance with exercise, home and travel O2 prescription;To learn and understand importance of monitoring SPO2 with pulse oximeter and demonstrate accurate use of the pulse oximeter.;To learn and understand importance of maintaining oxygen saturations>88%;To learn and demonstrate proper pursed lip breathing techniques or other breathing techniques.;To learn and demonstrate  proper use of respiratory medications  To learn and exhibit compliance with exercise, home and travel O2 prescription;To learn and understand importance of monitoring SPO2 with pulse oximeter and demonstrate accurate use of the pulse oximeter.;To learn and understand importance of maintaining oxygen saturations>88%;To learn and demonstrate proper pursed lip breathing techniques or other breathing techniques.;To learn and demonstrate proper use of respiratory medications  To learn and exhibit compliance with exercise, home and travel O2 prescription;To learn and understand importance of monitoring SPO2 with pulse oximeter and demonstrate accurate use of the pulse oximeter.;To learn and understand importance of maintaining oxygen saturations>88%;To learn and demonstrate proper pursed lip breathing techniques or other breathing techniques.;To learn and demonstrate proper use of respiratory medications    Long  Term Goals  Exhibits compliance with exercise, home and travel O2 prescription;Maintenance of O2 saturations>88%;Compliance with respiratory medication;Verbalizes importance of monitoring SPO2 with pulse oximeter and return demonstration;Exhibits proper breathing techniques, such as pursed lip breathing or other method taught during program session  Exhibits compliance with exercise, home and travel  O2 prescription;Maintenance of O2 saturations>88%;Compliance with respiratory medication;Verbalizes importance of monitoring SPO2 with pulse oximeter and return demonstration;Exhibits proper breathing techniques, such as pursed lip breathing or other method taught during program session  Exhibits compliance with exercise, home and travel O2 prescription;Maintenance of O2 saturations>88%;Compliance with respiratory medication;Verbalizes importance of monitoring SPO2 with pulse oximeter and return demonstration;Exhibits proper breathing techniques, such as pursed lip breathing or other method taught during program session  Exhibits compliance with exercise, home and travel O2 prescription;Maintenance of O2 saturations>88%;Compliance with respiratory medication;Verbalizes importance of monitoring SPO2 with pulse oximeter and return demonstration;Exhibits proper breathing techniques, such as pursed lip breathing or other method taught during program session    Comments  patient does not require oxygen during exercise and able to maintain saturations >93% with exertion.  patient does not require oxygen during exercise and able to maintain saturations >93% with exertion.  patient does not require oxygen during exercise and able to maintain saturations >93% with exertion.  patient does not require oxygen during exercise and able to maintain saturations >93% with exertion.       Oxygen Discharge (Final Oxygen Re-Evaluation): Oxygen Re-Evaluation - 12/23/17 1514      Program Oxygen Prescription   Program Oxygen Prescription  None      Home Oxygen   Home Oxygen Device  Home Concentrator;E-Tanks    Sleep Oxygen Prescription  CPAP;Continuous    Liters per minute  2.5    Home Exercise Oxygen Prescription  None    Liters per minute  0    Home at Rest Exercise Oxygen Prescription  None    Compliance with Home Oxygen Use  No      Goals/Expected Outcomes   Short Term Goals  To learn and exhibit compliance with  exercise, home and travel O2 prescription;To learn and understand importance of monitoring SPO2 with pulse oximeter and demonstrate accurate use of the pulse oximeter.;To learn and understand importance of maintaining oxygen saturations>88%;To learn and demonstrate proper pursed lip breathing techniques or other breathing techniques.;To learn and demonstrate proper use of respiratory medications    Long  Term Goals  Exhibits compliance with exercise, home and travel O2 prescription;Maintenance of O2 saturations>88%;Compliance with respiratory medication;Verbalizes importance of monitoring SPO2 with pulse oximeter and return demonstration;Exhibits proper breathing techniques, such as pursed lip breathing or other method taught during program session    Comments  patient does not require oxygen during exercise and able to maintain saturations >  93% with exertion.       Initial Exercise Prescription: Initial Exercise Prescription - 10/03/17 1600      Date of Initial Exercise RX and Referring Provider   Date  10/03/17    Referring Provider  Dr. Melvyn Novas      Recumbant Bike   Level  2    Watts  20    Minutes  17      NuStep   Level  2    Minutes  17    METs  1.5      Track   Laps  10    Minutes  17      Prescription Details   Frequency (times per week)  2    Duration  Progress to 45 minutes of aerobic exercise without signs/symptoms of physical distress      Intensity   THRR 40-80% of Max Heartrate  60-120    Ratings of Perceived Exertion  11-13    Perceived Dyspnea  0-4      Progression   Progression  Continue progressive overload as per policy without signs/symptoms or physical distress.      Resistance Training   Training Prescription  Yes    Weight  blue bands    Reps  10-15       Perform Capillary Blood Glucose checks as needed.  Exercise Prescription Changes: Exercise Prescription Changes    Row Name 10/08/17 1200 10/10/17 1300 10/15/17 1200 10/17/17 1200 10/29/17 1237      Response to Exercise   Blood Pressure (Admit)  100/60  120/56  110/60  110/58  130/58   Blood Pressure (Exercise)  152/74  110/70  140/50  120/60  130/70   Blood Pressure (Exit)  126/62  114/76  102/70  106/58  130/74   Heart Rate (Admit)  64 bpm  78 bpm  72 bpm  76 bpm  70 bpm   Heart Rate (Exercise)  80 bpm  80 bpm  87 bpm  87 bpm  91 bpm   Heart Rate (Exit)  70 bpm  73 bpm  64 bpm  72 bpm  70 bpm   Oxygen Saturation (Admit)  93 %  95 %  92 %  93 %  95 %   Oxygen Saturation (Exercise)  93 %  93 %  94 %  94 %  92 %   Oxygen Saturation (Exit)  92 %  90 %  92 %  93 %  92 %   Rating of Perceived Exertion (Exercise)  '13  13  13  13  13   ' Perceived Dyspnea (Exercise)  0  1  0  0  1   Duration  Progress to 45 minutes of aerobic exercise without signs/symptoms of physical distress  Progress to 45 minutes of aerobic exercise without signs/symptoms of physical distress  Progress to 45 minutes of aerobic exercise without signs/symptoms of physical distress  Continue with 45 min of aerobic exercise without signs/symptoms of physical distress.  Continue with 45 min of aerobic exercise without signs/symptoms of physical distress.   Intensity  Other (comment) 40-80% of HRR  Other (comment) 40-80% of HRR  THRR unchanged  -  -     Progression   Progression  Continue to progress workloads to maintain intensity without signs/symptoms of physical distress.  Continue to progress workloads to maintain intensity without signs/symptoms of physical distress.  Continue to progress workloads to maintain intensity without signs/symptoms of physical distress.  Continue to progress workloads  to maintain intensity without signs/symptoms of physical distress.  Continue to progress workloads to maintain intensity without signs/symptoms of physical distress.     Resistance Training   Training Prescription  Yes  Yes  Yes  Yes  Yes   Weight  blue bands  blue bands  blue bands  blue bands  blue bands   Reps  10-15  10-15   10-15  10-15  10-15   Time  10 Minutes  10 Minutes  10 Minutes  10 Minutes  10 Minutes     Interval Training   Interval Training  -  -  -  No  No     Recumbant Bike   Level  2  -  1  2  2.5   Minutes  17  -  '17  17  17     ' NuStep   Level  '2  2  3  ' -  4   Minutes  '17  17  17  ' -  17   METs  1.7  1.7  1.7  -  -     Track   Laps  '13  13  13  10  13   ' Minutes  '17  17  17  17  17   ' Row Name 11/05/17 1200 11/07/17 1200 11/12/17 1200 11/14/17 1200 11/19/17 1230     Response to Exercise   Blood Pressure (Admit)  108/56  104/66  122/76  110/54  108/50   Blood Pressure (Exercise)  138/60  138/70  - met with nutritionist the entire session, didnt exercise  132/70  132/72   Blood Pressure (Exit)  104/70  130/80  124/64  114/60  106/54   Heart Rate (Admit)  69 bpm  77 bpm  82 bpm  73 bpm  79 bpm   Heart Rate (Exercise)  89 bpm  95 bpm  -  92 bpm  92 bpm   Heart Rate (Exit)  75 bpm  71 bpm  73 bpm  74 bpm  77 bpm   Oxygen Saturation (Admit)  91 %  93 %  96 %  92 %  97 %   Oxygen Saturation (Exercise)  91 %  93 %  -  92 %  93 %   Oxygen Saturation (Exit)  92 %  93 %  93 %  93 %  92 %   Rating of Perceived Exertion (Exercise)  13  15  -  13  13   Perceived Dyspnea (Exercise)  3  0  -  1  1   Duration  Continue with 45 min of aerobic exercise without signs/symptoms of physical distress.  Continue with 45 min of aerobic exercise without signs/symptoms of physical distress.  Progress to 45 minutes of aerobic exercise without signs/symptoms of physical distress  Progress to 45 minutes of aerobic exercise without signs/symptoms of physical distress  Progress to 45 minutes of aerobic exercise without signs/symptoms of physical distress   Intensity  THRR unchanged  THRR unchanged  THRR unchanged  THRR unchanged  THRR unchanged     Progression   Progression  Continue to progress workloads to maintain intensity without signs/symptoms of physical distress.  Continue to progress workloads to maintain  intensity without signs/symptoms of physical distress.  Continue to progress workloads to maintain intensity without signs/symptoms of physical distress.  Continue to progress workloads to maintain intensity without signs/symptoms of physical distress.  Continue to progress workloads to maintain intensity without signs/symptoms of  physical distress.     Resistance Training   Training Prescription  Yes  Yes  Yes  Yes  Yes   Weight  blue bands  blue bands  blue bands  blue bands  blue bands   Reps  10-15  10-15  10-15  10-15  10-15   Time  10 Minutes  10 Minutes  10 Minutes  10 Minutes  10 Minutes     Interval Training   Interval Training  No  No  No  No  No     Recumbant Bike   Level  3  -  -  3  3   Minutes  17  -  -  17  17     NuStep   Level  5  5  -  5  5   Minutes  17  17  -  17  17   METs  2.5  3.2  -  2.5  2.2     Track   Laps  11  15  -  13  12   Minutes  17  17  -  [place] Name 11/21/17 1300 11/28/17 1236 12/10/17 1400 12/12/17 1200 12/19/17 1200     Response to Exercise   Blood Pressure (Admit)  100/52  124/70  130/80  114/52  118/60   Blood Pressure (Exercise)  154/72  132/70  138/62  142/80  124/56   Blood Pressure (Exit)  104/60  98/62  112/60  100/64  100/54   Heart Rate (Admit)  71 bpm  86 bpm  75 bpm  68 bpm  76 bpm   Heart Rate (Exercise)  87 bpm  91 bpm  92 bpm  92 bpm  90 bpm   Heart Rate (Exit)  60 bpm  78 bpm  78 bpm  71 bpm  82 bpm   Oxygen Saturation (Admit)  94 %  93 %  90 %  92 %  91 %   Oxygen Saturation (Exercise)  92 %  91 %  92 %  92 %  93 %   Oxygen Saturation (Exit)  92 %  93 %  89 %  93 %  93 %   Rating of Perceived Exertion (Exercise)  '13  13  13  13  13   ' Perceived Dyspnea (Exercise)  1  0  0  0  0   Duration  Progress to 45 minutes of aerobic exercise without signs/symptoms of physical distress  Progress to 45 minutes of aerobic exercise without signs/symptoms of physical distress  Progress to 45 minutes of aerobic exercise without  signs/symptoms of physical distress  Progress to 45 minutes of aerobic exercise without signs/symptoms of physical distress  Progress to 45 minutes of aerobic exercise without signs/symptoms of physical distress   Intensity  THRR unchanged  THRR unchanged  THRR unchanged  THRR unchanged  THRR unchanged     Progression   Progression  Continue to progress workloads to maintain intensity without signs/symptoms of physical distress.  Continue to progress workloads to maintain intensity without signs/symptoms of physical distress.  Continue to progress workloads to maintain intensity without signs/symptoms of physical distress.  Continue to progress workloads to maintain intensity without signs/symptoms of physical distress.  Continue to progress workloads to maintain intensity without signs/symptoms of physical distress.     Resistance Training   Training Prescription  Yes  Yes  Yes  Yes  Yes   Weight  blue bands  blue bands  blue bands  blue bands  blue bands   Reps  10-15  10-15  10-15  10-15  10-15   Time  10 Minutes  10 Minutes  10 Minutes  10 Minutes  10 Minutes     Interval Training   Interval Training  No  No  No  No  No     Recumbant Bike   Level  3.5  4  3.5  4  -   Minutes  '17  17  17  17  ' -     NuStep   Level  -  '5  5  5  5   ' Minutes  -  '17  17  17  17   ' METs  -  2.1  1.9  2.4  2.4     Track   Laps  '11  11  12  ' -  15   Minutes  '17  17  17  ' -  17     Home Exercise Plan   Plans to continue exercise at  -  -  Home (comment)  -  -   Frequency  -  -  Add 3 additional days to program exercise sessions.  -  -      Exercise Comments: Exercise Comments    Row Name 12/12/17 0734           Exercise Comments  Home exercise completed          Exercise Goals and Review:   Exercise Goals Re-Evaluation : Exercise Goals Re-Evaluation    Row Name 10/17/17 7510 11/04/17 1633 11/21/17 0755 12/23/17 0735       Exercise Goal Re-Evaluation   Exercise Goals Review  Increase  Physical Activity;Increase Strength and Stamina;Able to understand and use Dyspnea scale;Able to understand and use rate of perceived exertion (RPE) scale;Knowledge and understanding of Target Heart Rate Range (THRR);Understanding of Exercise Prescription  Increase Physical Activity;Increase Strength and Stamina;Able to understand and use Dyspnea scale;Able to understand and use rate of perceived exertion (RPE) scale;Knowledge and understanding of Target Heart Rate Range (THRR);Understanding of Exercise Prescription  Increase Strength and Stamina;Able to understand and use Dyspnea scale;Increase Physical Activity;Able to understand and use rate of perceived exertion (RPE) scale;Knowledge and understanding of Target Heart Rate Range (THRR);Understanding of Exercise Prescription  Able to understand and use Dyspnea scale;Increase Strength and Stamina;Increase Physical Activity;Able to understand and use rate of perceived exertion (RPE) scale;Knowledge and understanding of Target Heart Rate Range (THRR);Understanding of Exercise Prescription    Comments  Patient has only completed three days of rehab. Will monitor and progress as able.   Patient is slowly progressing. Walking 10-13 laps (200 ft. each) in 15 minutes. Will cont. to progress as able.   Patient is slowly progressing. Walking 10-13 laps (200 ft. each) in 15 minutes. Will cont. to progress as able.   Patient is slowly progressing. Is able to walk 15 laps (200 ft each) in 15 minutes. Diet and lifestyle have no seemed to improve. Home exercise completed. Will cont. to encourage.     Expected Outcomes  Through exercise at home and at rehab, patient will increase strength stamina and be able to perform ADL's easier.   Through exercise at rehab and at home, patient will increase strength and stamina making ADL's easier to perform. Patient will also have a better understanding of safe exercise and what they are capable to do outside of clinical supervision.   Through exercise  at rehab and at home, patient will increase strength and stamina making ADL's easier to perform. Patient will also have a better understanding of safe exercise and what they are capable to do outside of clinical supervision.  Through exercise at rehab and at home, patient will increase strength and stamina and be less short of breath with ADL's at home.        Discharge Exercise Prescription (Final Exercise Prescription Changes): Exercise Prescription Changes - 12/19/17 1200      Response to Exercise   Blood Pressure (Admit)  118/60    Blood Pressure (Exercise)  124/56    Blood Pressure (Exit)  100/54    Heart Rate (Admit)  76 bpm    Heart Rate (Exercise)  90 bpm    Heart Rate (Exit)  82 bpm    Oxygen Saturation (Admit)  91 %    Oxygen Saturation (Exercise)  93 %    Oxygen Saturation (Exit)  93 %    Rating of Perceived Exertion (Exercise)  13    Perceived Dyspnea (Exercise)  0    Duration  Progress to 45 minutes of aerobic exercise without signs/symptoms of physical distress    Intensity  THRR unchanged      Progression   Progression  Continue to progress workloads to maintain intensity without signs/symptoms of physical distress.      Resistance Training   Training Prescription  Yes    Weight  blue bands    Reps  10-15    Time  10 Minutes      Interval Training   Interval Training  No      NuStep   Level  5    Minutes  17    METs  2.4      Track   Laps  15    Minutes  17       Nutrition:  Target Goals: Understanding of nutrition guidelines, daily intake of sodium <1584m, cholesterol <2022m calories 30% from fat and 7% or less from saturated fats, daily to have 5 or more servings of fruits and vegetables.  Biometrics:    Nutrition Therapy Plan and Nutrition Goals: Nutrition Therapy & Goals - 11/12/17 1229      Nutrition Therapy   Diet  Carb Modified, Heart Healthy      Personal Nutrition Goals   Nutrition Goal  Identify food quantities  necessary to achieve wt loss of  -2# per week to a goal wt loss of 6-24 lb at graduation from pulmonary rehab.    Personal Goal #2  CBG's in the normal range or as close to normal as is safely possible.      Intervention Plan   Intervention  Prescribe, educate and counsel regarding individualized specific dietary modifications aiming towards targeted core components such as weight, hypertension, lipid management, diabetes, heart failure and other comorbidities.;Nutrition handout(s) given to patient. 1500-1800 kcal, 5-day menu ideas    Expected Outcomes  Short Term Goal: Understand basic principles of dietary content, such as calories, fat, sodium, cholesterol and nutrients.;Long Term Goal: Adherence to prescribed nutrition plan.       Nutrition Assessments: Nutrition Assessments - 11/12/17 1229      Rate Your Plate Scores   Pre Score  57       Nutrition Goals Re-Evaluation:   Nutrition Goals Discharge (Final Nutrition Goals Re-Evaluation):   Psychosocial: Target Goals: Acknowledge presence or absence of significant depression and/or stress, maximize coping skills, provide positive support system. Participant is able to  verbalize types and ability to use techniques and skills needed for reducing stress and depression.  Initial Review & Psychosocial Screening: Initial Psych Review & Screening - 09/27/17 1305      Initial Review   Current issues with  None Identified      Family Dynamics   Good Support System?  Yes      Barriers   Psychosocial barriers to participate in program  There are no identifiable barriers or psychosocial needs.      Screening Interventions   Interventions  Encouraged to exercise       Quality of Life Scores:  Scores of 19 and below usually indicate a poorer quality of life in these areas.  A difference of  2-3 points is a clinically meaningful difference.  A difference of 2-3 points in the total score of the Quality of Life Index has been associated  with significant improvement in overall quality of life, self-image, physical symptoms, and general health in studies assessing change in quality of life.   PHQ-9: Recent Review Flowsheet Data    Depression screen [facility] 2/9 09/27/2017 06/25/2017   Decreased Interest 0 0   Down, Depressed, Hopeless 0 0   PHQ - 2 Score 0 0     Interpretation of Total Score  Total Score Depression Severity:  1-4 = Minimal depression, 5-9 = Mild depression, 10-14 = Moderate depression, 15-19 = Moderately severe depression, 20-27 = Severe depression   Psychosocial Evaluation and Intervention: Psychosocial Evaluation - 09/27/17 1305      Psychosocial Evaluation & Interventions   Interventions  Encouraged to exercise with the program and follow exercise prescription    Expected Outcomes  patient will remain free from psychosocial barriers to participation in pulmonary rehab    Continue Psychosocial Services   No Follow up required       Psychosocial Re-Evaluation: Psychosocial Re-Evaluation    [place] Name 10/14/17 1631 11/04/17 1736 11/28/17 0734 12/23/17 1156       Psychosocial Re-Evaluation   Current issues with  None Identified  None Identified  None Identified  None Identified    Comments  -  patient has attended 90% of exercise sessions since admission  patient has attended 90% of exercise sessions since admission  -    Expected Outcomes  patient will remain free from psychosocial barriers to exercise in pulmonary rehab  patient will remain free from psychosocial barriers to exercise in pulmonary rehab  patient will remain free from psychosocial barriers to exercise in pulmonary rehab  patient will remain free from psychosocial barriers to exercise in pulmonary rehab    Continue Psychosocial Services   No Follow up required  No Follow up required  No Follow up required  No Follow up required       Psychosocial Discharge (Final Psychosocial Re-Evaluation): Psychosocial Re-Evaluation - 12/23/17 1156       Psychosocial Re-Evaluation   Current issues with  None Identified    Expected Outcomes  patient will remain free from psychosocial barriers to exercise in pulmonary rehab    Continue Psychosocial Services   No Follow up required       Education: Education Goals: Education classes will be provided on a weekly basis, covering required topics. Participant will state understanding/return demonstration of topics presented.  Learning Barriers/Preferences:   Education Topics: Risk Factor Reduction:  -Group instruction that is supported by a PowerPoint presentation. Instructor discusses the definition of a risk factor, different risk factors for pulmonary disease, and how the heart  and lungs work together.     Nutrition for Pulmonary Patient:  -Group instruction provided by PowerPoint slides, verbal discussion, and written materials to support subject matter. The instructor gives an explanation and review of healthy diet recommendations, which includes a discussion on weight management, recommendations for fruit and vegetable consumption, as well as protein, fluid, caffeine, fiber, sodium, sugar, and alcohol. Tips for eating when patients are short of breath are discussed.   Pursed Lip Breathing:  -Group instruction that is supported by demonstration and informational handouts. Instructor discusses the benefits of pursed lip and diaphragmatic breathing and detailed demonstration on how to preform both.     Oxygen Safety:  -Group instruction provided by PowerPoint, verbal discussion, and written material to support subject matter. There is an overview of "What is Oxygen" and "Why do we need it".  Instructor also reviews how to create a safe environment for oxygen use, the importance of using oxygen as prescribed, and the risks of noncompliance. There is a brief discussion on traveling with oxygen and resources the patient may utilize.   Oxygen Equipment:  -Group instruction provided by [facility] Staff utilizing handouts, written materials, and equipment demonstrations.   PULMONARY REHAB CHRONIC OBSTRUCTIVE PULMONARY DISEASE from 12/19/2017 in [place]  Date  10/31/17  Educator  [facility]  Instruction Review Code  2- meets goals/outcomes      Signs and Symptoms:  -Group instruction provided by written material and verbal discussion to support subject matter. Warning signs and symptoms of infection, stroke, and heart attack are reviewed and when to call the physician/911 reinforced. Tips for preventing the spread of infection discussed.   Advanced Directives:  -Group instruction provided by verbal instruction and written material to support subject matter. Instructor reviews Advanced Directive laws and proper instruction for filling out document.   Pulmonary Video:  -Group video education that reviews the importance of medication and oxygen compliance, exercise, good nutrition, pulmonary hygiene, and pursed lip and diaphragmatic breathing for the pulmonary patient.   Exercise for the Pulmonary Patient:  -Group instruction that is supported by a PowerPoint presentation. Instructor discusses benefits of exercise, core components of exercise, frequency, duration, and intensity of an exercise routine, importance of utilizing pulse oximetry during exercise, safety while exercising, and options of places to exercise outside of rehab.     PULMONARY REHAB CHRONIC OBSTRUCTIVE PULMONARY DISEASE from 12/19/2017 in [place]  Date  10/17/17  Educator  Cloyde Reams  Instruction Review Code  2- meets goals/outcomes      Pulmonary Medications:  -Verbally interactive group education provided by instructor with focus on inhaled medications and proper administration.   PULMONARY REHAB CHRONIC OBSTRUCTIVE PULMONARY DISEASE from 12/19/2017 in [place]  Date  12/19/17  Educator  Pharm   Instruction Review Code  2- meets goals/outcomes      Anatomy and Physiology of the Respiratory System and Intimacy:  -Group instruction provided by PowerPoint, verbal discussion, and written material to support subject matter. Instructor reviews respiratory cycle and anatomical components of the respiratory system and their functions. Instructor also reviews differences in obstructive and restrictive respiratory diseases with examples of each. Intimacy, Sex, and Sexuality differences are reviewed with a discussion on how relationships can change when diagnosed with pulmonary disease. Common sexual concerns are reviewed.   PULMONARY REHAB CHRONIC OBSTRUCTIVE PULMONARY DISEASE from 12/19/2017 in [place]  Date  11/21/17  Educator  RN  Instruction Review Code  2- meets goals/outcomes      MD DAY -A group question and answer session with a medical doctor that allows participants to ask questions that relate to their pulmonary disease state.   PULMONARY REHAB CHRONIC OBSTRUCTIVE PULMONARY DISEASE from 12/19/2017 in [place]  Date  12/12/17  Educator  [employer]  Instruction Review Code  2- meets goals/outcomes      OTHER EDUCATION -Group or individual verbal, written, or video instructions that support the educational goals of the pulmonary rehab program.   PULMONARY REHAB CHRONIC OBSTRUCTIVE PULMONARY DISEASE from 12/19/2017 in [place]  Date  10/10/17 [[employer] [facility]]  Educator  RD  Instruction Review Code  1- Verbalizes Understanding      Knowledge Questionnaire Score: Knowledge Questionnaire Score - 10/08/17 1514      Knowledge Questionnaire Score   Pre Score  10/13       Core Components/Risk Factors/Patient Goals at Admission: Personal Goals and Risk Factors at Admission - 09/27/17 1304      Core Components/Risk Factors/Patient Goals on Admission    Weight Management   Yes;Obesity    Intervention  Weight Management: Develop a combined nutrition and exercise program designed to reach desired caloric intake, while maintaining appropriate intake of nutrient and fiber, sodium and fats, and appropriate energy expenditure required for the weight goal.;Obesity: Provide education and appropriate resources to help participant work on and attain dietary goals.;Weight Management: Provide education and appropriate resources to help participant work on and attain dietary goals.;Weight Management/Obesity: Establish reasonable short term and long term weight goals.    Expected Outcomes  Short Term: Continue to assess and modify interventions until short term weight is achieved;Long Term: Adherence to nutrition and physical activity/exercise program aimed toward attainment of established weight goal;Weight Gain: Understanding of general recommendations for a high calorie, high protein meal plan that promotes weight gain by distributing calorie intake throughout the day with the consumption for 4-5 meals, snacks, and/or supplements;Understanding of distribution of calorie intake throughout the day with the consumption of 4-5 meals/snacks;Understanding recommendations for meals to include 15-35% energy as protein, 25-35% energy from fat, 35-60% energy from carbohydrates, less than 237m of dietary cholesterol, 20-35 gm of total fiber daily;Weight Loss: Understanding of general recommendations for a balanced deficit meal plan, which promotes 1-2 lb weight loss per week and includes a negative energy balance of [phone] kcal/d    Improve shortness of breath with ADL's  Yes    Intervention  Provide education, individualized exercise plan and daily activity instruction to help decrease symptoms of SOB with activities of daily living.    Expected Outcomes  Short Term: Achieves a reduction of symptoms when performing activities of daily living.    Develop more efficient breathing techniques such as  purse lipped breathing and diaphragmatic breathing; and practicing self-pacing with activity  Yes    Intervention  Provide education, demonstration and support about specific breathing techniuqes utilized for more efficient breathing. Include techniques such as pursed lipped breathing, diaphragmatic breathing and self-pacing activity.    Expected Outcomes  Short Term: Participant will be able to demonstrate and use breathing techniques as needed throughout daily activities.    Diabetes  Yes    Intervention  Provide education about signs/symptoms and action to take for hypo/hyperglycemia.    Expected Outcomes  Short Term: Participant verbalizes understanding of the signs/symptoms and immediate care of hyper/hypoglycemia, proper foot care and importance of medication, aerobic/resistive exercise and  nutrition plan for blood glucose control.;Long Term: Attainment of HbA1C < 7%.    Heart Failure  Yes    Intervention  Provide a combined exercise and nutrition program that is supplemented with education, support and counseling about heart failure. Directed toward relieving symptoms such as shortness of breath, decreased exercise tolerance, and extremity edema.    Expected Outcomes  Improve functional capacity of life;Short term: Attendance in program 2-3 days a week with increased exercise capacity. Reported lower sodium intake. Reported increased fruit and vegetable intake. Reports medication compliance.;Short term: Daily weights obtained and reported for increase. Utilizing diuretic protocols set by physician.;Long term: Adoption of self-care skills and reduction of barriers for early signs and symptoms recognition and intervention leading to self-care maintenance.       Core Components/Risk Factors/Patient Goals Review:  Goals and Risk Factor Review    Row Name 10/14/17 1630 11/04/17 1730 11/28/17 0731 12/23/17 1145       Core Components/Risk Factors/Patient Goals Review   Personal Goals Review  Weight  Management/Obesity;Improve shortness of breath with ADL's;Develop more efficient breathing techniques such as purse lipped breathing and diaphragmatic breathing and practicing self-pacing with activity.;Diabetes;Heart Failure  Weight Management/Obesity;Improve shortness of breath with ADL's;Develop more efficient breathing techniques such as purse lipped breathing and diaphragmatic breathing and practicing self-pacing with activity.;Diabetes;Heart Failure  Weight Management/Obesity;Improve shortness of breath with ADL's;Develop more efficient breathing techniques such as purse lipped breathing and diaphragmatic breathing and practicing self-pacing with activity.;Diabetes;Heart Failure  Weight Management/Obesity;Improve shortness of breath with ADL's;Develop more efficient breathing techniques such as purse lipped breathing and diaphragmatic breathing and practicing self-pacing with activity.;Diabetes;Heart Failure    Review  patient has only attended several session since admission and it is too soon to evaluate progression towards pulmonary rehab goals  patient is slowly progressing towards pulmonary rehab goals. He continues to be limited by ankle pain from a previous surgery that according to patient was "botched". Patient states that current orthopedist states only surgery will correct the issue but the patient is unsure if he is physically able to enure another surgery at this time. He prefers to recondition himself at pulmonary rehab prior to considering another surgery. This pain limits his ability to work to his maximum cardiopulmonary fitness ability. He continues to be coached on the importants of maintaining a diabetic diet and taking responsibility of having the correct working equiptment/supplies at each exercise session. Working with patient to identify barriers. Patients weight remains stable although his goal is to begin a significant weightloss lifestyle. Will continue to coach patient with goals  and help him identify barriers to obtaining goals.  Patient is slowly progressing towards pulmonary rehab goals. He continues to be limited by ankle pain from a previous surgery. This pain limits his ability to work to his maximum cardiopulmonary fitness ability. Patient is doing much better on maintaining a  diabetic diet and taking responsibility of having the correct working equiptment/supplies at each exercise session. Patients weight remains stable although his goal is to begin a significant weightloss lifestyle. Will continue to coach patient with goals and help him identify barriers to obtaining goals.  Patient continues to progress slowly towards pulmonary rehab goals. His ankle pain will always be a barrier until he decides to have corrective surgery. He continues to bring his own diabetic meter and remember to check and report his sugars prior to exercise. He has had to have reminders regarding appropriate diet prior to exercise. He tends to carb load and subsiquently his CBGs  are too high to exercise. He has done better the last two exercise sessions eating a more well balanced diet with protein and fat. Patients weight remains stable.     Expected Outcomes  see admission outcomes  see admission outcomes  see admission outcomes  see admission outcomes       Core Components/Risk Factors/Patient Goals at Discharge (Final Review):  Goals and Risk Factor Review - 12/23/17 1145      Core Components/Risk Factors/Patient Goals Review   Personal Goals Review  Weight Management/Obesity;Improve shortness of breath with ADL's;Develop more efficient breathing techniques such as purse lipped breathing and diaphragmatic breathing and practicing self-pacing with activity.;Diabetes;Heart Failure    Review  Patient continues to progress slowly towards pulmonary rehab goals. His ankle pain will always be a barrier until he decides to have corrective surgery. He continues to bring his own diabetic meter and  remember to check and report his sugars prior to exercise. He has had to have reminders regarding appropriate diet prior to exercise. He tends to carb load and subsiquently his CBGs are too high to exercise. He has done better the last two exercise sessions eating a more well balanced diet with protein and fat. Patients weight remains stable.     Expected Outcomes  see admission outcomes       ITP Comments:   Comments: Patient has attended 84 pulmonary rehab sessions since admission.

## 2017-12-26 ENCOUNTER — Encounter (HOSPITAL_COMMUNITY)
Admission: RE | Admit: 2017-12-26 | Discharge: 2017-12-26 | Disposition: A | Discharge status: Home / Self Care | Payer: Federal, State, Local not specified - PPO | Source: Ambulatory Visit | Attending: Internal Medicine | Admitting: Internal Medicine

## 2017-12-26 DIAGNOSIS — E119 Type 2 diabetes mellitus without complications: Secondary | ICD-10-CM | POA: Diagnosis not present

## 2017-12-26 DIAGNOSIS — Z713 Dietary counseling and surveillance: Secondary | ICD-10-CM | POA: Diagnosis not present

## 2017-12-26 NOTE — Progress Notes (Signed)
Daily Session Note  Patient Details  Name: Jeremiah [name] MRN: [id number] Date of Birth: 1946-04-28 Referring Provider:     Pulmonary Rehab Walk Test from 10/03/2017 in [place]  Referring Provider  Dr. Melvyn Novas      Encounter Date: 12/26/2017  Check In: Session Check In - 12/26/17 1030      Check-In   Location  [facility]    Staff Present  Trish Fountain, RN, BSN;Molly diVincenzo, MS, ACSM RCEP, Exercise Physiologist;Lilburn Straw Leonia Reeves, RN, BSN    Supervising physician immediately available to respond to emergencies  [facility] immediately available    Physician(s)  Dr. Bonner Puna    Medication changes reported      No    Fall or balance concerns reported     No    Tobacco Cessation  No Change    Warm-up and Cool-down  Performed as group-led instruction    Resistance Training Performed  Yes    VAD Patient?  No      Pain Assessment   Currently in Pain?  No/denies    Multiple Pain Sites  No       Capillary Blood Glucose: No results found for this or any previous visit (from the past 24 hour(s)).  Exercise Prescription Changes - 12/26/17 1200      Response to Exercise   Blood Pressure (Admit)  124/50    Blood Pressure (Exercise)  130/70    Blood Pressure (Exit)  120/64    Heart Rate (Admit)  73 bpm    Heart Rate (Exercise)  94 bpm    Heart Rate (Exit)  68 bpm    Oxygen Saturation (Admit)  94 %    Oxygen Saturation (Exercise)  91 %    Oxygen Saturation (Exit)  92 %    Rating of Perceived Exertion (Exercise)  13    Perceived Dyspnea (Exercise)  0    Duration  Progress to 45 minutes of aerobic exercise without signs/symptoms of physical distress    Intensity  THRR unchanged      Progression   Progression  Continue to progress workloads to maintain intensity without signs/symptoms of physical distress.      Resistance Training   Training Prescription  Yes    Weight  blue bands    Reps  10-15    Time  10 Minutes      Interval  Training   Interval Training  No      Recumbant Bike   Level  4    Minutes  17      Track   Laps  14    Minutes  17       Social History   Tobacco Use  Smoking Status Former Smoker  . Packs/day: 0.50  . Years: 60.00  . Pack years: 30.00  . Types: Cigarettes  . Last attempt to quit: 12/15/2016  . Years since quitting: 1.0  Smokeless Tobacco Never Used    Goals Met:  Exercise tolerated well Strength training completed today  Goals Unmet:  Not Applicable  Comments: Service time is from 1030 to 1210    Dr. Rush Farmer is Medical Director for Pulmonary Rehab at [facility].

## 2017-12-31 ENCOUNTER — Encounter (HOSPITAL_COMMUNITY)
Admission: RE | Admit: 2017-12-31 | Discharge: 2017-12-31 | Disposition: A | Discharge status: Home / Self Care | Payer: Federal, State, Local not specified - PPO | Source: Ambulatory Visit | Attending: Internal Medicine | Admitting: Internal Medicine

## 2017-12-31 VITALS — Wt 250.2 lb

## 2017-12-31 DIAGNOSIS — Z713 Dietary counseling and surveillance: Secondary | ICD-10-CM | POA: Diagnosis not present

## 2017-12-31 DIAGNOSIS — E119 Type 2 diabetes mellitus without complications: Secondary | ICD-10-CM | POA: Diagnosis not present

## 2017-12-31 DIAGNOSIS — J449 Chronic obstructive pulmonary disease, unspecified: Secondary | ICD-10-CM

## 2017-12-31 NOTE — Progress Notes (Signed)
Daily Session Note  Patient Details  Name: Jeremiah [name] MRN: [id number] Date of Birth: 09/14/1946 Referring Provider:     Pulmonary Rehab Walk Test from 10/03/2017 in [place]  Referring Provider  Dr. Melvyn Novas      Encounter Date: 12/31/2017  Check In: Session Check In - 12/31/17 1219      Check-In   Location  [facility]    Staff Present  Trish Fountain, RN, BSN;Molly diVincenzo, MS, ACSM RCEP, Exercise Physiologist;Coumba Kellison Leonia Reeves, RN, Roque Cash, RN    Supervising physician immediately available to respond to emergencies  [facility] immediately available    Physician(s)  Dr. Bonner Puna    Medication changes reported      No    Fall or balance concerns reported     No    Tobacco Cessation  No Change    Warm-up and Cool-down  Performed as group-led instruction    Resistance Training Performed  Yes    VAD Patient?  No      Pain Assessment   Currently in Pain?  No/denies    Multiple Pain Sites  No       Capillary Blood Glucose: No results found for this or any previous visit (from the past 24 hour(s)).  Exercise Prescription Changes - 12/31/17 1200      Response to Exercise   Blood Pressure (Admit)  140/70    Blood Pressure (Exercise)  156/68    Blood Pressure (Exit)  100/60    Heart Rate (Admit)  73 bpm    Heart Rate (Exercise)  91 bpm    Heart Rate (Exit)  76 bpm    Oxygen Saturation (Admit)  90 %    Oxygen Saturation (Exercise)  94 %    Oxygen Saturation (Exit)  93 %    Rating of Perceived Exertion (Exercise)  13    Perceived Dyspnea (Exercise)  0    Duration  Progress to 45 minutes of aerobic exercise without signs/symptoms of physical distress    Intensity  THRR unchanged      Progression   Progression  Continue to progress workloads to maintain intensity without signs/symptoms of physical distress.      Resistance Training   Training Prescription  Yes    Weight  blue bands    Reps  10-15    Time  10 Minutes       Interval Training   Interval Training  No      Recumbant Bike   Level  4    Minutes  17      NuStep   Level  5    Minutes  17    METs  2.5      Track   Laps  14    Minutes  17       Social History   Tobacco Use  Smoking Status Former Smoker  . Packs/day: 0.50  . Years: 60.00  . Pack years: 30.00  . Types: Cigarettes  . Last attempt to quit: 12/15/2016  . Years since quitting: 1.0  Smokeless Tobacco Never Used    Goals Met:  Exercise tolerated well Strength training completed today  Goals Unmet:  Not Applicable  Comments: Service time is from 1030 to 1200    Dr. Rush Farmer is Medical Director for Pulmonary Rehab at [facility].

## 2018-01-02 ENCOUNTER — Encounter (HOSPITAL_COMMUNITY)
Admission: RE | Admit: 2018-01-02 | Discharge: 2018-01-02 | Disposition: A | Discharge status: Home / Self Care | Payer: Federal, State, Local not specified - PPO | Source: Ambulatory Visit | Attending: Internal Medicine | Admitting: Internal Medicine

## 2018-01-02 DIAGNOSIS — I5033 Acute on chronic diastolic (congestive) heart failure: Secondary | ICD-10-CM | POA: Diagnosis not present

## 2018-01-02 DIAGNOSIS — Z713 Dietary counseling and surveillance: Secondary | ICD-10-CM | POA: Diagnosis not present

## 2018-01-02 DIAGNOSIS — E119 Type 2 diabetes mellitus without complications: Secondary | ICD-10-CM | POA: Diagnosis not present

## 2018-01-02 DIAGNOSIS — J449 Chronic obstructive pulmonary disease, unspecified: Secondary | ICD-10-CM

## 2018-01-02 NOTE — Progress Notes (Signed)
Daily Session Note  Patient Details  Name: Jeremiah [name] MRN: [id number] Date of Birth: 1946/05/12 Referring Provider:     Pulmonary Rehab Walk Test from 10/03/2017 in [place]  Referring Provider  Dr. Melvyn Novas      Encounter Date: 01/02/2018  Check In: Session Check In - 01/02/18 1119      Check-In   Location  [facility]    Staff Present  Trish Fountain, RN, BSN;Norvell Caswell, MS, ACSM RCEP, Exercise Physiologist;Joan Leonia Reeves, RN, Roque Cash, RN    Supervising physician immediately available to respond to emergencies  [facility] immediately available    Physician(s)   Dr. Tyrell Antonio    Medication changes reported      No    Fall or balance concerns reported     No    Tobacco Cessation  No Change    Warm-up and Cool-down  Performed as group-led instruction    Resistance Training Performed  Yes    VAD Patient?  No      Pain Assessment   Currently in Pain?  No/denies    Multiple Pain Sites  No       Capillary Blood Glucose: No results found for this or any previous visit (from the past 24 hour(s)).    Social History   Tobacco Use  Smoking Status Former Smoker  . Packs/day: 0.50  . Years: 60.00  . Pack years: 30.00  . Types: Cigarettes  . Last attempt to quit: 12/15/2016  . Years since quitting: 1.0  Smokeless Tobacco Never Used    Goals Met:  Exercise tolerated well No report of cardiac concerns or symptoms Strength training completed today  Goals Unmet:  Not Applicable  Comments: Service time is from 10:30a to 1:30p    Patient attended education class on Mental Health and Chronic Disease with Lucianne Lei   Dr. Rush Farmer is Medical Director for Pulmonary Rehab at [facility].

## 2018-01-07 ENCOUNTER — Encounter (HOSPITAL_COMMUNITY)
Admission: RE | Admit: 2018-01-07 | Discharge: 2018-01-07 | Disposition: A | Discharge status: Home / Self Care | Payer: Federal, State, Local not specified - PPO | Source: Ambulatory Visit | Attending: Internal Medicine | Admitting: Internal Medicine

## 2018-01-07 VITALS — Wt 251.5 lb

## 2018-01-07 DIAGNOSIS — Z713 Dietary counseling and surveillance: Secondary | ICD-10-CM | POA: Insufficient documentation

## 2018-01-07 DIAGNOSIS — E119 Type 2 diabetes mellitus without complications: Secondary | ICD-10-CM | POA: Diagnosis not present

## 2018-01-07 DIAGNOSIS — J449 Chronic obstructive pulmonary disease, unspecified: Secondary | ICD-10-CM

## 2018-01-07 NOTE — Progress Notes (Signed)
Daily Session Note  Patient Details  Name: Jeremiah [name] MRN: [id number] Date of Birth: 11/09/1946 Referring Provider:     Pulmonary Rehab Walk Test from 10/03/2017 in [facility]  Referring Provider  Dr. Wert      Encounter Date: 01/07/2018  Check In: Session Check In - 01/07/18 1218      Check-In   Location  [facility]    Staff Present  Portia Payne, RN, BSN;Molly diVincenzo, MS, ACSM RCEP, Exercise Physiologist;Joan Behrens, RN, BSN;Lisa Hughes, RN    Supervising physician immediately available to respond to emergencies  [facility] immediately available    Physician(s)  Dr. Regalado    Medication changes reported      No    Fall or balance concerns reported     No    Tobacco Cessation  No Change    Warm-up and Cool-down  Performed as group-led instruction    Resistance Training Performed  Yes    VAD Patient?  No      Pain Assessment   Currently in Pain?  No/denies    Multiple Pain Sites  No       Capillary Blood Glucose: No results found for this or any previous visit (from the past 24 hour(s)).  Exercise Prescription Changes - 01/07/18 1200      Response to Exercise   Blood Pressure (Admit)  120/64    Blood Pressure (Exercise)  130/60    Blood Pressure (Exit)  118/64    Heart Rate (Admit)  81 bpm    Heart Rate (Exercise)  91 bpm    Heart Rate (Exit)  79 bpm    Oxygen Saturation (Admit)  93 %    Oxygen Saturation (Exercise)  92 %    Oxygen Saturation (Exit)  90 %    Rating of Perceived Exertion (Exercise)  13    Perceived Dyspnea (Exercise)  0    Duration  Progress to 45 minutes of aerobic exercise without signs/symptoms of physical distress    Intensity  THRR unchanged      Progression   Progression  Continue to progress workloads to maintain intensity without signs/symptoms of physical distress.      Resistance Training   Training Prescription  Yes    Weight  blue bands    Reps  10-15    Time  10  Minutes      Interval Training   Interval Training  No      Recumbant Bike   Level  4    Minutes  17      NuStep   Level  5    Minutes  17    METs  2.1      Track   Laps  12    Minutes  17       Social History   Tobacco Use  Smoking Status Former Smoker  . Packs/day: 0.50  . Years: 60.00  . Pack years: 30.00  . Types: Cigarettes  . Last attempt to quit: 12/15/2016  . Years since quitting: 1.0  Smokeless Tobacco Never Used    Goals Met:  Exercise tolerated well No report of cardiac concerns or symptoms Strength training completed today  Goals Unmet:  Not Applicable  Comments: Service time is from 10:30a to 12:05p    Dr. Wesam G. Yacoub is Medical Director for Pulmonary Rehab at [facility]. 

## 2018-01-09 ENCOUNTER — Encounter (HOSPITAL_COMMUNITY): Payer: Federal, State, Local not specified - PPO

## 2018-01-09 ENCOUNTER — Telehealth (HOSPITAL_COMMUNITY): Payer: Self-pay | Admitting: Family Medicine

## 2018-01-14 ENCOUNTER — Encounter (HOSPITAL_COMMUNITY): Payer: Self-pay

## 2018-01-14 ENCOUNTER — Encounter (HOSPITAL_COMMUNITY)
Admission: RE | Admit: 2018-01-14 | Discharge: 2018-01-14 | Disposition: A | Discharge status: Home / Self Care | Payer: Federal, State, Local not specified - PPO | Source: Ambulatory Visit

## 2018-01-14 ENCOUNTER — Telehealth (HOSPITAL_COMMUNITY): Payer: Self-pay | Admitting: Family Medicine

## 2018-01-14 DIAGNOSIS — R05 Cough: Secondary | ICD-10-CM | POA: Diagnosis not present

## 2018-01-14 DIAGNOSIS — J449 Chronic obstructive pulmonary disease, unspecified: Secondary | ICD-10-CM

## 2018-01-14 DIAGNOSIS — R5383 Other fatigue: Secondary | ICD-10-CM | POA: Diagnosis not present

## 2018-01-14 NOTE — Progress Notes (Signed)
Pulmonary Individual Treatment Plan  Patient Details  Name: Jeremiah [name] MRN: [id number] Date of Birth: Nov 22, 1946 Referring Provider:     Pulmonary Rehab Walk Test from 10/03/2017 in [place]  Referring Provider  Dr. Melvyn Novas      Initial Encounter Date:    Pulmonary Rehab Walk Test from 10/03/2017 in [place]  Date  10/03/17  Referring Provider  Dr. Melvyn Novas      Visit Diagnosis: Chronic obstructive pulmonary disease, unspecified COPD type ([place])  Patient's Home Medications on Admission:   Current Outpatient Medications:  .  aspirin 81 MG tablet, Take 1 tablet (81 mg total) by mouth daily. Hold for 5 days (Patient taking differently: Take 81 mg by mouth daily. ), Disp: 30 tablet, Rfl:  .  atorvastatin (LIPITOR) 80 MG tablet, Take 80 mg by mouth daily., Disp: , Rfl:  .  betamethasone dipropionate 0.05 % lotion, Apply topically daily. Apply to affected area daily, Disp: , Rfl:  .  bisoprolol (ZEBETA) 5 MG tablet, Take 1 tablet (5 mg total) by mouth daily., Disp: 60 tablet, Rfl: 11 .  furosemide (LASIX) 40 MG tablet, Take 1 tablet (40 mg total) by mouth daily., Disp: 90 tablet, Rfl: 3 .  glimepiride (AMARYL) 1 MG tablet, Take 1 tablet by mouth daily., Disp: , Rfl:  .  ipratropium-albuterol (DUONEB) 0.5-2.5 (3) MG/3ML SOLN, Take 3 mLs by nebulization every 4 (four) hours as needed., Disp: 360 mL, Rfl: 0 .  IRON PO, Take 1 tablet by mouth daily., Disp: , Rfl:  .  ketoconazole (NIZORAL) 2 % shampoo, AS DIRECTED, Disp: , Rfl:  .  levothyroxine (SYNTHROID, LEVOTHROID) 75 MCG tablet, Take 75 mcg by mouth daily before breakfast., Disp: , Rfl:  .  metFORMIN (GLUCOPHAGE) 1000 MG tablet, Take 1,000 mg by mouth daily with breakfast., Disp: , Rfl:  .  OXYGEN, 2.5 lpm with sleep only AHC, Disp: , Rfl:  .  pantoprazole (PROTONIX) 40 MG tablet, Take 1 tablet (40 mg total) by mouth 2 (two) times daily before a meal., Disp: 60 tablet, Rfl: 2 .   Turmeric 500 MG TABS, Take 500 mg by mouth daily., Disp: , Rfl:   Past Medical History: Past Medical History:  Diagnosis Date  . A-fib ([place])   . CAD (coronary artery disease)    a. 2006: CABG in 2006 with LIMA-LAD, SVG-OM1, and SVG-RPDA  . Chronic pain 03/21/2016  . Essential hypertension 03/21/2016  . History of prostate cancer 03/21/2016  . HLD (hyperlipidemia)   . HTN (hypertension)   . Hx of CABG 2006  . Hypercholesteremia 03/21/2016  . Hypothyroidism   . Medication management 03/21/2016  . Morbid obesity ([place]) 03/21/2016  . Myocardial infarct ([place])   . OSA (obstructive sleep apnea) 03/21/2016  . Pain in right ankle and joints of right foot 03/21/2016  . Paronychia 03/21/2016  . Primary insomnia 03/21/2016  . Prostate cancer ([place]) 2008  . Tobacco dependence 03/21/2016    Tobacco Use: Social History   Tobacco Use  Smoking Status Former Smoker  . Packs/day: 0.50  . Years: 60.00  . Pack years: 30.00  . Types: Cigarettes  . Last attempt to quit: 12/15/2016  . Years since quitting: 1.0  Smokeless Tobacco Never Used    Labs: Recent Review Scientist, physiological    Labs for ITP Cardiac and Pulmonary Rehab Latest Ref Rng & Units 05/05/2016 02/25/2017 02/25/2017   HCO3 20.0 - 28.0 mmol/L - 36.1(H) -   TCO2  0 - 100 mmol/L 26 38 33   O2SAT % - 86.0 -      Capillary Blood Glucose: Lab Results  Component Value Date   GLUCAP 138 (H) 10/29/2017   GLUCAP 236 (H) 10/29/2017   GLUCAP 118 (H) 10/17/2017   GLUCAP 163 (H) 10/08/2017   GLUCAP 196 (H) 10/08/2017   POCT Glucose    Row Name 11/19/17 1232 11/28/17 1239 12/10/17 1423 12/12/17 1242 12/19/17 1228     POCT Blood Glucose   Pre-Exercise  126 mg/dL  196 mg/dL  196 mg/dL  236 mg/dL  204 mg/dL   Post-Exercise  125 mg/dL  167 mg/dL  218 mg/dL  240 mg/dL  116 mg/dL   Row Name 12/24/17 1243 01/07/18 1224           POCT Blood Glucose   Pre-Exercise  196 mg/dL  172 mg/dL      Post-Exercise  136 mg/dL  156 mg/dL         Pulmonary  Assessment Scores:   Pulmonary Function Assessment:   Exercise Target Goals:    Exercise Program Goal: Individual exercise prescription set using results from initial 6 min walk test and THRR while considering  patient's activity barriers and safety.    Exercise Prescription Goal: Initial exercise prescription builds to 30-45 minutes a day of aerobic activity, 2-3 days per week.  Home exercise guidelines will be given to patient during program as part of exercise prescription that the participant will acknowledge.  Activity Barriers & Risk Stratification:   6 Minute Walk:   Oxygen Initial Assessment:   Oxygen Re-Evaluation: Oxygen Re-Evaluation    Row Name 11/28/17 0730 12/23/17 1514 01/10/18 0740         Program Oxygen Prescription   Program Oxygen Prescription  None  None  None       Home Oxygen   Home Oxygen Device  Home Concentrator;E-Tanks  Home Concentrator;E-Tanks  Home Concentrator;E-Tanks     Sleep Oxygen Prescription  CPAP;Continuous  CPAP;Continuous  CPAP;Continuous     Liters per minute  2.5  2.5  2.5     Home Exercise Oxygen Prescription  None  None  None     Liters per minute  0  0  0     Home at Rest Exercise Oxygen Prescription  None  None  None     Compliance with Home Oxygen Use  No  No  No       Goals/Expected Outcomes   Short Term Goals  To learn and exhibit compliance with exercise, home and travel O2 prescription;To learn and understand importance of monitoring SPO2 with pulse oximeter and demonstrate accurate use of the pulse oximeter.;To learn and understand importance of maintaining oxygen saturations>88%;To learn and demonstrate proper pursed lip breathing techniques or other breathing techniques.;To learn and demonstrate proper use of respiratory medications  To learn and exhibit compliance with exercise, home and travel O2 prescription;To learn and understand importance of monitoring SPO2 with pulse oximeter and demonstrate accurate use of the  pulse oximeter.;To learn and understand importance of maintaining oxygen saturations>88%;To learn and demonstrate proper pursed lip breathing techniques or other breathing techniques.;To learn and demonstrate proper use of respiratory medications  To learn and exhibit compliance with exercise, home and travel O2 prescription;To learn and understand importance of monitoring SPO2 with pulse oximeter and demonstrate accurate use of the pulse oximeter.;To learn and understand importance of maintaining oxygen saturations>88%;To learn and demonstrate proper pursed lip breathing techniques or other breathing techniques.;To learn  and demonstrate proper use of respiratory medications     Long  Term Goals  Exhibits compliance with exercise, home and travel O2 prescription;Maintenance of O2 saturations>88%;Compliance with respiratory medication;Verbalizes importance of monitoring SPO2 with pulse oximeter and return demonstration;Exhibits proper breathing techniques, such as pursed lip breathing or other method taught during program session  Exhibits compliance with exercise, home and travel O2 prescription;Maintenance of O2 saturations>88%;Compliance with respiratory medication;Verbalizes importance of monitoring SPO2 with pulse oximeter and return demonstration;Exhibits proper breathing techniques, such as pursed lip breathing or other method taught during program session  Exhibits compliance with exercise, home and travel O2 prescription;Maintenance of O2 saturations>88%;Compliance with respiratory medication;Verbalizes importance of monitoring SPO2 with pulse oximeter and return demonstration;Exhibits proper breathing techniques, such as pursed lip breathing or other method taught during program session     Comments  patient does not require oxygen during exercise and able to maintain saturations >93% with exertion.  patient does not require oxygen during exercise and able to maintain saturations >93% with exertion.   patient does not require oxygen during exercise and able to maintain saturations >93% with exertion.        Oxygen Discharge (Final Oxygen Re-Evaluation): Oxygen Re-Evaluation - 01/10/18 0740      Program Oxygen Prescription   Program Oxygen Prescription  None      Home Oxygen   Home Oxygen Device  Home Concentrator;E-Tanks    Sleep Oxygen Prescription  CPAP;Continuous    Liters per minute  2.5    Home Exercise Oxygen Prescription  None    Liters per minute  0    Home at Rest Exercise Oxygen Prescription  None    Compliance with Home Oxygen Use  No      Goals/Expected Outcomes   Short Term Goals  To learn and exhibit compliance with exercise, home and travel O2 prescription;To learn and understand importance of monitoring SPO2 with pulse oximeter and demonstrate accurate use of the pulse oximeter.;To learn and understand importance of maintaining oxygen saturations>88%;To learn and demonstrate proper pursed lip breathing techniques or other breathing techniques.;To learn and demonstrate proper use of respiratory medications    Long  Term Goals  Exhibits compliance with exercise, home and travel O2 prescription;Maintenance of O2 saturations>88%;Compliance with respiratory medication;Verbalizes importance of monitoring SPO2 with pulse oximeter and return demonstration;Exhibits proper breathing techniques, such as pursed lip breathing or other method taught during program session    Comments  patient does not require oxygen during exercise and able to maintain saturations >93% with exertion.       Initial Exercise Prescription:   Perform Capillary Blood Glucose checks as needed.  Exercise Prescription Changes: Exercise Prescription Changes    Row Name 11/19/17 1230 11/21/17 1300 11/28/17 1236 12/10/17 1400 12/12/17 1200     Response to Exercise   Blood Pressure (Admit)  108/50  100/52  124/70  130/80  114/52   Blood Pressure (Exercise)  132/72  154/72  132/70  138/62  142/80    Blood Pressure (Exit)  106/54  104/60  98/62  112/60  100/64   Heart Rate (Admit)  79 bpm  71 bpm  86 bpm  75 bpm  68 bpm   Heart Rate (Exercise)  92 bpm  87 bpm  91 bpm  92 bpm  92 bpm   Heart Rate (Exit)  77 bpm  60 bpm  78 bpm  78 bpm  71 bpm   Oxygen Saturation (Admit)  97 %  94 %  93 %  90 %  92 %   Oxygen Saturation (Exercise)  93 %  92 %  91 %  92 %  92 %   Oxygen Saturation (Exit)  92 %  92 %  93 %  89 %  93 %   Rating of Perceived Exertion (Exercise)  '13  13  13  13  13   ' Perceived Dyspnea (Exercise)  1  1  0  0  0   Duration  Progress to 45 minutes of aerobic exercise without signs/symptoms of physical distress  Progress to 45 minutes of aerobic exercise without signs/symptoms of physical distress  Progress to 45 minutes of aerobic exercise without signs/symptoms of physical distress  Progress to 45 minutes of aerobic exercise without signs/symptoms of physical distress  Progress to 45 minutes of aerobic exercise without signs/symptoms of physical distress   Intensity  THRR unchanged  THRR unchanged  THRR unchanged  THRR unchanged  THRR unchanged     Progression   Progression  Continue to progress workloads to maintain intensity without signs/symptoms of physical distress.  Continue to progress workloads to maintain intensity without signs/symptoms of physical distress.  Continue to progress workloads to maintain intensity without signs/symptoms of physical distress.  Continue to progress workloads to maintain intensity without signs/symptoms of physical distress.  Continue to progress workloads to maintain intensity without signs/symptoms of physical distress.     Resistance Training   Training Prescription  Yes  Yes  Yes  Yes  Yes   Weight  blue bands  blue bands  blue bands  blue bands  blue bands   Reps  10-15  10-15  10-15  10-15  10-15   Time  10 Minutes  10 Minutes  10 Minutes  10 Minutes  10 Minutes     Interval Training   Interval Training  No  No  No  No  No     Recumbant  Bike   Level  3  3.5  4  3.5  4   Minutes  '17  17  17  17  17     ' NuStep   Level  5  -  '5  5  5   ' Minutes  17  -  '17  17  17   ' METs  2.2  -  2.1  1.9  2.4     Track   Laps  '12  11  11  12  ' -   Minutes  '17  17  17  17  ' -     Home Exercise Plan   Plans to continue exercise at  -  -  -  Home (comment)  -   Frequency  -  -  -  Add 3 additional days to program exercise sessions.  -   Row Name 12/19/17 1200 12/24/17 1200 12/26/17 1200 12/31/17 1200 01/07/18 1200     Response to Exercise   Blood Pressure (Admit)  118/60  112/64  124/50  140/70  120/64   Blood Pressure (Exercise)  124/56  136/70  130/70  156/68  130/60   Blood Pressure (Exit)  100/54  126/64  120/64  100/60  118/64   Heart Rate (Admit)  76 bpm  71 bpm  73 bpm  73 bpm  81 bpm   Heart Rate (Exercise)  90 bpm  84 bpm  94 bpm  91 bpm  91 bpm   Heart Rate (Exit)  82 bpm  65 bpm  68 bpm  76  bpm  79 bpm   Oxygen Saturation (Admit)  91 %  92 %  94 %  90 %  93 %   Oxygen Saturation (Exercise)  93 %  92 %  91 %  94 %  92 %   Oxygen Saturation (Exit)  93 %  93 %  92 %  93 %  90 %   Rating of Perceived Exertion (Exercise)  '13  13  13  13  13   ' Perceived Dyspnea (Exercise)  0  1  0  0  0   Duration  Progress to 45 minutes of aerobic exercise without signs/symptoms of physical distress  Progress to 45 minutes of aerobic exercise without signs/symptoms of physical distress  Progress to 45 minutes of aerobic exercise without signs/symptoms of physical distress  Progress to 45 minutes of aerobic exercise without signs/symptoms of physical distress  Progress to 45 minutes of aerobic exercise without signs/symptoms of physical distress   Intensity  THRR unchanged  THRR unchanged  THRR unchanged  THRR unchanged  THRR unchanged     Progression   Progression  Continue to progress workloads to maintain intensity without signs/symptoms of physical distress.  Continue to progress workloads to maintain intensity without signs/symptoms of physical  distress.  Continue to progress workloads to maintain intensity without signs/symptoms of physical distress.  Continue to progress workloads to maintain intensity without signs/symptoms of physical distress.  Continue to progress workloads to maintain intensity without signs/symptoms of physical distress.     Resistance Training   Training Prescription  Yes  Yes  Yes  Yes  Yes   Weight  blue bands  blue bands  blue bands  blue bands  blue bands   Reps  10-15  10-15  10-15  10-15  10-15   Time  10 Minutes  10 Minutes  10 Minutes  10 Minutes  10 Minutes     Interval Training   Interval Training  No  No  No  No  No     Recumbant Bike   Level  -  '4  4  4  4   ' Minutes  -  '17  17  17  17     ' NuStep   Level  5  5  -  5  5   Minutes  17  17  -  17  17   METs  2.4  2.2  -  2.5  2.1     Track   Laps  '15  13  14  14  12   ' Minutes  '17  17  17  17  17      ' Exercise Comments: Exercise Comments    Row Name 12/12/17 0734           Exercise Comments  Home exercise completed          Exercise Goals and Review:   Exercise Goals Re-Evaluation : Exercise Goals Re-Evaluation    Row Name 11/21/17 0755 12/23/17 0735 01/10/18 0741         Exercise Goal Re-Evaluation   Exercise Goals Review  Increase Strength and Stamina;Able to understand and use Dyspnea scale;Increase Physical Activity;Able to understand and use rate of perceived exertion (RPE) scale;Knowledge and understanding of Target Heart Rate Range (THRR);Understanding of Exercise Prescription  Able to understand and use Dyspnea scale;Increase Strength and Stamina;Increase Physical Activity;Able to understand and use rate of perceived exertion (RPE) scale;Knowledge and understanding of Target Heart Rate Range (THRR);Understanding of Exercise  Prescription  Able to understand and use Dyspnea scale;Increase Strength and Stamina;Increase Physical Activity;Able to understand and use rate of perceived exertion (RPE) scale;Knowledge and  understanding of Target Heart Rate Range (THRR);Understanding of Exercise Prescription     Comments  Patient is slowly progressing. Walking 10-13 laps (200 ft. each) in 15 minutes. Will cont. to progress as able.   Patient is slowly progressing. Is able to walk 15 laps (200 ft each) in 15 minutes. Diet and lifestyle have no seemed to improve. Home exercise completed. Will cont. to encourage.   Patient is graduating on 01/14/18. Will cont to exercise at the [facility]. States he is motivated to lose weight but has not made many efforts to do so. Encouraged to exercise at least 5 days a week for weight loss.      Expected Outcomes  Through exercise at rehab and at home, patient will increase strength and stamina making ADL's easier to perform. Patient will also have a better understanding of safe exercise and what they are capable to do outside of clinical supervision.  Through exercise at rehab and at home, patient will increase strength and stamina and be less short of breath with ADL's at home.   Through exercise at rehab and at home, patient will increase strength and stamina and be less short of breath with ADL's at home.         Discharge Exercise Prescription (Final Exercise Prescription Changes): Exercise Prescription Changes - 01/07/18 1200      Response to Exercise   Blood Pressure (Admit)  120/64    Blood Pressure (Exercise)  130/60    Blood Pressure (Exit)  118/64    Heart Rate (Admit)  81 bpm    Heart Rate (Exercise)  91 bpm    Heart Rate (Exit)  79 bpm    Oxygen Saturation (Admit)  93 %    Oxygen Saturation (Exercise)  92 %    Oxygen Saturation (Exit)  90 %    Rating of Perceived Exertion (Exercise)  13    Perceived Dyspnea (Exercise)  0    Duration  Progress to 45 minutes of aerobic exercise without signs/symptoms of physical distress    Intensity  THRR unchanged      Progression   Progression  Continue to progress workloads to maintain intensity without signs/symptoms of physical  distress.      Resistance Training   Training Prescription  Yes    Weight  blue bands    Reps  10-15    Time  10 Minutes      Interval Training   Interval Training  No      Recumbant Bike   Level  4    Minutes  17      NuStep   Level  5    Minutes  17    METs  2.1      Track   Laps  12    Minutes  17       Nutrition:  Target Goals: Understanding of nutrition guidelines, daily intake of sodium <156m, cholesterol <204m calories 30% from fat and 7% or less from saturated fats, daily to have 5 or more servings of fruits and vegetables.  Biometrics:    Nutrition Therapy Plan and Nutrition Goals:   Nutrition Assessments:   Nutrition Goals Re-Evaluation:   Nutrition Goals Discharge (Final Nutrition Goals Re-Evaluation):   Psychosocial: Target Goals: Acknowledge presence or absence of significant depression and/or stress, maximize coping skills, provide positive support system.  Participant is able to verbalize types and ability to use techniques and skills needed for reducing stress and depression.  Initial Review & Psychosocial Screening:   Quality of Life Scores:  Scores of 19 and below usually indicate a poorer quality of life in these areas.  A difference of  2-3 points is a clinically meaningful difference.  A difference of 2-3 points in the total score of the Quality of Life Index has been associated with significant improvement in overall quality of life, self-image, physical symptoms, and general health in studies assessing change in quality of life.   PHQ-9: Recent Review Flowsheet Data    Depression screen [facility] 2/9 09/27/2017 06/25/2017   Decreased Interest 0 0   Down, Depressed, Hopeless 0 0   PHQ - 2 Score 0 0     Interpretation of Total Score  Total Score Depression Severity:  1-4 = Minimal depression, 5-9 = Mild depression, 10-14 = Moderate depression, 15-19 = Moderately severe depression, 20-27 = Severe depression   Psychosocial Evaluation  and Intervention:   Psychosocial Re-Evaluation: Psychosocial Re-Evaluation    [place] Name 11/28/17 0734 12/23/17 1156           Psychosocial Re-Evaluation   Current issues with  None Identified  None Identified      Comments  patient has attended 90% of exercise sessions since admission  -      Expected Outcomes  patient will remain free from psychosocial barriers to exercise in pulmonary rehab  patient will remain free from psychosocial barriers to exercise in pulmonary rehab      Continue Psychosocial Services   No Follow up required  No Follow up required         Psychosocial Discharge (Final Psychosocial Re-Evaluation): Psychosocial Re-Evaluation - 12/23/17 1156      Psychosocial Re-Evaluation   Current issues with  None Identified    Expected Outcomes  patient will remain free from psychosocial barriers to exercise in pulmonary rehab    Continue Psychosocial Services   No Follow up required       Education: Education Goals: Education classes will be provided on a weekly basis, covering required topics. Participant will state understanding/return demonstration of topics presented.  Learning Barriers/Preferences:   Education Topics: Risk Factor Reduction:  -Group instruction that is supported by a PowerPoint presentation. Instructor discusses the definition of a risk factor, different risk factors for pulmonary disease, and how the heart and lungs work together.     Nutrition for Pulmonary Patient:  -Group instruction provided by PowerPoint slides, verbal discussion, and written materials to support subject matter. The instructor gives an explanation and review of healthy diet recommendations, which includes a discussion on weight management, recommendations for fruit and vegetable consumption, as well as protein, fluid, caffeine, fiber, sodium, sugar, and alcohol. Tips for eating when patients are short of breath are discussed.   Pursed Lip Breathing:  -Group instruction  that is supported by demonstration and informational handouts. Instructor discusses the benefits of pursed lip and diaphragmatic breathing and detailed demonstration on how to preform both.     Oxygen Safety:  -Group instruction provided by PowerPoint, verbal discussion, and written material to support subject matter. There is an overview of "What is Oxygen" and "Why do we need it".  Instructor also reviews how to create a safe environment for oxygen use, the importance of using oxygen as prescribed, and the risks of noncompliance. There is a brief discussion on traveling with oxygen and resources the patient may  utilize.   Oxygen Equipment:  -Group instruction provided by [facility] Staff utilizing handouts, written materials, and equipment demonstrations.   PULMONARY REHAB CHRONIC OBSTRUCTIVE PULMONARY DISEASE from 12/26/2017 in [place]  Date  10/31/17  Educator  [employer]  Instruction Review Code (Retired)  2- meets goals/outcomes      Signs and Symptoms:  -Group instruction provided by written material and verbal discussion to support subject matter. Warning signs and symptoms of infection, stroke, and heart attack are reviewed and when to call the physician/911 reinforced. Tips for preventing the spread of infection discussed.   Advanced Directives:  -Group instruction provided by verbal instruction and written material to support subject matter. Instructor reviews Advanced Directive laws and proper instruction for filling out document.   Pulmonary Video:  -Group video education that reviews the importance of medication and oxygen compliance, exercise, good nutrition, pulmonary hygiene, and pursed lip and diaphragmatic breathing for the pulmonary patient.   Exercise for the Pulmonary Patient:  -Group instruction that is supported by a PowerPoint presentation. Instructor discusses benefits of exercise, core components of exercise, frequency,  duration, and intensity of an exercise routine, importance of utilizing pulse oximetry during exercise, safety while exercising, and options of places to exercise outside of rehab.     PULMONARY REHAB CHRONIC OBSTRUCTIVE PULMONARY DISEASE from 12/26/2017 in [place]  Date  10/17/17  Educator  Cloyde Reams  Instruction Review Code (Retired)  2- meets goals/outcomes      Pulmonary Medications:  -Engineer, maintenance (IT) group education provided by Art therapist with focus on inhaled medications and proper administration.   PULMONARY REHAB CHRONIC OBSTRUCTIVE PULMONARY DISEASE from 12/26/2017 in [place]  Date  12/19/17  Educator  Pharm  Instruction Review Code (Retired)  2- Lawyer and Physiology of the Respiratory System and Intimacy:  -Group instruction provided by PowerPoint, verbal discussion, and written material to support subject matter. Instructor reviews respiratory cycle and anatomical components of the respiratory system and their functions. Instructor also reviews differences in obstructive and restrictive respiratory diseases with examples of each. Intimacy, Sex, and Sexuality differences are reviewed with a discussion on how relationships can change when diagnosed with pulmonary disease. Common sexual concerns are reviewed.   PULMONARY REHAB CHRONIC OBSTRUCTIVE PULMONARY DISEASE from 12/26/2017 in [place]  Date  11/21/17  Educator  RN  Instruction Review Code (Retired)  2- meets goals/outcomes      MD DAY -A group question and answer session with a medical doctor that allows participants to ask questions that relate to their pulmonary disease state.   PULMONARY REHAB CHRONIC OBSTRUCTIVE PULMONARY DISEASE from 12/26/2017 in [place]  Date  12/12/17  Educator  [email or web address]  Instruction Review Code (Retired)  2- meets goals/outcomes       OTHER EDUCATION -Group or individual verbal, written, or video instructions that support the educational goals of the pulmonary rehab program.   PULMONARY REHAB CHRONIC OBSTRUCTIVE PULMONARY DISEASE from 12/26/2017 in [place]  Date  12/26/17  Educator  EP  Instruction Review Code  1- Verbalizes Understanding      Holiday Eating Survival Tips:  -Group instruction provided by PowerPoint slides, verbal discussion, and written materials to support subject matter. The instructor gives patients tips, tricks, and techniques to help them not only survive but enjoy the holidays despite the onslaught of food that  accompanies the holidays.   Knowledge Questionnaire Score:   Core Components/Risk Factors/Patient Goals at Admission:   Core Components/Risk Factors/Patient Goals Review:  Goals and Risk Factor Review    Row Name 11/28/17 0731 12/23/17 1145 01/14/18 1239         Core Components/Risk Factors/Patient Goals Review   Personal Goals Review  Weight Management/Obesity;Improve shortness of breath with ADL's;Develop more efficient breathing techniques such as purse lipped breathing and diaphragmatic breathing and practicing self-pacing with activity.;Diabetes;Heart Failure  Weight Management/Obesity;Improve shortness of breath with ADL's;Develop more efficient breathing techniques such as purse lipped breathing and diaphragmatic breathing and practicing self-pacing with activity.;Diabetes;Heart Failure  Weight Management/Obesity;Improve shortness of breath with ADL's;Develop more efficient breathing techniques such as purse lipped breathing and diaphragmatic breathing and practicing self-pacing with activity.;Diabetes;Heart Failure     Review  Patient is slowly progressing towards pulmonary rehab goals. He continues to be limited by ankle pain from a previous surgery. This pain limits his ability to work to his maximum cardiopulmonary fitness ability. Patient  is doing much better on maintaining a  diabetic diet and taking responsibility of having the correct working equiptment/supplies at each exercise session. Patients weight remains stable although his goal is to begin a significant weightloss lifestyle. Will continue to coach patient with goals and help him identify barriers to obtaining goals.  Patient continues to progress slowly towards pulmonary rehab goals. His ankle pain will always be a barrier until he decides to have corrective surgery. He continues to bring his own diabetic meter and remember to check and report his sugars prior to exercise. He has had to have reminders regarding appropriate diet prior to exercise. He tends to carb load and subsiquently his CBGs are too high to exercise. He has done better the last two exercise sessions eating a more well balanced diet with protein and fat. Patients weight remains stable.   patient has met his pulmonary rehab goals of improving his shortness of breath with ADLs and utilizing PLB. He still has not made any progress in weight loss and his diabetes is not well controlled related to diet. He has been absent at the last two scheduled exercise sessions related to a head cold. he is scheduled to graduate at his next session.     Expected Outcomes  see admission outcomes  see admission outcomes  see admission outcomes        Core Components/Risk Factors/Patient Goals at Discharge (Final Review):  Goals and Risk Factor Review - 01/14/18 1239      Core Components/Risk Factors/Patient Goals Review   Personal Goals Review  Weight Management/Obesity;Improve shortness of breath with ADL's;Develop more efficient breathing techniques such as purse lipped breathing and diaphragmatic breathing and practicing self-pacing with activity.;Diabetes;Heart Failure    Review  patient has met his pulmonary rehab goals of improving his shortness of breath with ADLs and utilizing PLB. He still has not made any progress in  weight loss and his diabetes is not well controlled related to diet. He has been absent at the last two scheduled exercise sessions related to a head cold. he is scheduled to graduate at his next session.    Expected Outcomes  see admission outcomes       ITP Comments:   Comments: patient has attended 21 sessions during his 14 week enrollment

## 2018-01-16 ENCOUNTER — Encounter (HOSPITAL_COMMUNITY): Payer: Federal, State, Local not specified - PPO

## 2018-01-20 DIAGNOSIS — E1165 Type 2 diabetes mellitus with hyperglycemia: Secondary | ICD-10-CM | POA: Diagnosis not present

## 2018-01-20 DIAGNOSIS — Z7984 Long term (current) use of oral hypoglycemic drugs: Secondary | ICD-10-CM | POA: Diagnosis not present

## 2018-01-20 DIAGNOSIS — I10 Essential (primary) hypertension: Secondary | ICD-10-CM | POA: Diagnosis not present

## 2018-01-20 DIAGNOSIS — Z23 Encounter for immunization: Secondary | ICD-10-CM | POA: Diagnosis not present

## 2018-01-20 DIAGNOSIS — Z13220 Encounter for screening for lipoid disorders: Secondary | ICD-10-CM | POA: Diagnosis not present

## 2018-01-20 DIAGNOSIS — E039 Hypothyroidism, unspecified: Secondary | ICD-10-CM | POA: Diagnosis not present

## 2018-01-20 DIAGNOSIS — Z125 Encounter for screening for malignant neoplasm of prostate: Secondary | ICD-10-CM | POA: Diagnosis not present

## 2018-01-20 DIAGNOSIS — Z0000 Encounter for general adult medical examination without abnormal findings: Secondary | ICD-10-CM | POA: Diagnosis not present

## 2018-01-21 ENCOUNTER — Encounter (HOSPITAL_COMMUNITY)
Admission: RE | Admit: 2018-01-21 | Discharge: 2018-01-21 | Disposition: A | Discharge status: Home / Self Care | Payer: Federal, State, Local not specified - PPO | Source: Ambulatory Visit | Attending: Internal Medicine | Admitting: Internal Medicine

## 2018-01-21 VITALS — Wt 248.2 lb

## 2018-01-21 DIAGNOSIS — Z713 Dietary counseling and surveillance: Secondary | ICD-10-CM | POA: Diagnosis not present

## 2018-01-21 DIAGNOSIS — E119 Type 2 diabetes mellitus without complications: Secondary | ICD-10-CM | POA: Diagnosis not present

## 2018-01-21 DIAGNOSIS — J449 Chronic obstructive pulmonary disease, unspecified: Secondary | ICD-10-CM

## 2018-01-21 LAB — GLUCOSE, CAPILLARY: GLUCOSE-CAPILLARY: 108 mg/dL — AB (ref 65–99)

## 2018-01-21 NOTE — Progress Notes (Signed)
Daily Session Note  Patient Details  Name: Jeremiah [name] MRN: [id number] Date of Birth: 10/18/1946 Referring Provider:     Pulmonary Rehab Walk Test from 10/03/2017 in [place]  Referring Provider  Dr. Melvyn Novas      Encounter Date: 01/21/2018  Check In: Session Check In - 01/21/18 1030      Check-In   Location  [facility]    Staff Present  Rosebud Poles, RN, Luisa Hart, RN, BSN;Molly diVincenzo, MS, ACSM RCEP, Exercise Physiologist;Lisa Ysidro Evert, RN    Supervising physician immediately available to respond to emergencies  [facility] immediately available    Physician(s)  Dr. Tyrell Antonio    Medication changes reported      No    Fall or balance concerns reported     No    Tobacco Cessation  No Change    Warm-up and Cool-down  Performed as group-led instruction    Resistance Training Performed  Yes    VAD Patient?  No      Pain Assessment   Currently in Pain?  No/denies    Multiple Pain Sites  No       Capillary Blood Glucose: Results for orders placed or performed during the hospital encounter of 01/21/18 (from the past 24 hour(s))  Glucose, capillary     Status: Abnormal   Collection Time: 01/21/18 10:48 AM  Result Value Ref Range   Glucose-Capillary 108 (H) 65 - 99 mg/dL   POCT Glucose - 01/21/18 1217      POCT Blood Glucose   Pre-Exercise  108 mg/dL      Exercise Prescription Changes - 01/21/18 1208      Response to Exercise   Blood Pressure (Admit)  134/54    Blood Pressure (Exercise)  160/72    Blood Pressure (Exit)  110/60    Heart Rate (Admit)  74 bpm    Heart Rate (Exercise)  79 bpm    Heart Rate (Exit)  69 bpm    Oxygen Saturation (Admit)  92 %    Oxygen Saturation (Exercise)  91 %    Oxygen Saturation (Exit)  89 %    Rating of Perceived Exertion (Exercise)  12    Perceived Dyspnea (Exercise)  0    Duration  Progress to 45 minutes of aerobic exercise without signs/symptoms of physical distress    Intensity  THRR unchanged      Progression   Progression  Continue to progress workloads to maintain intensity without signs/symptoms of physical distress.      Resistance Training   Training Prescription  Yes    Weight  blue bands    Reps  10-15    Time  10 Minutes      Interval Training   Interval Training  No      Recumbant Bike   Level  4    Minutes  17      NuStep   Level  5    Minutes  17    METs  2.4       Social History   Tobacco Use  Smoking Status Former Smoker  . Packs/day: 0.50  . Years: 60.00  . Pack years: 30.00  . Types: Cigarettes  . Last attempt to quit: 12/15/2016  . Years since quitting: 1.1  Smokeless Tobacco Never Used    Goals Met:  Improved SOB with ADL's Using PLB without cueing & demonstrates good technique Exercise tolerated well No report of cardiac concerns or  symptoms Strength training completed today  Goals Unmet:  Not Applicable  Comments: Service time is from 1030 to 1200   Dr. Rush Farmer is Medical Director for Pulmonary Rehab at [facility].

## 2018-01-23 ENCOUNTER — Encounter (HOSPITAL_COMMUNITY): Payer: Self-pay

## 2018-01-23 ENCOUNTER — Encounter (HOSPITAL_COMMUNITY): Payer: Federal, State, Local not specified - PPO

## 2018-01-23 DIAGNOSIS — J449 Chronic obstructive pulmonary disease, unspecified: Secondary | ICD-10-CM

## 2018-01-23 NOTE — Progress Notes (Signed)
Pulmonary Discharge Individual Treatment Plan  Patient Details  Name: Jeremiah [name] MRN: [id number] Date of Birth: 01/27/1946 Referring Provider:     Pulmonary Rehab Walk Test from 10/03/2017 in [place]  Referring Provider  Dr. Melvyn Novas      Initial Encounter Date:    Pulmonary Rehab Walk Test from 10/03/2017 in [place]  Date  10/03/17  Referring Provider  Dr. Melvyn Novas      Visit Diagnosis: Chronic obstructive pulmonary disease, unspecified COPD type ([place])  Patient's Home Medications on Admission:   Current Outpatient Medications:  .  aspirin 81 MG tablet, Take 1 tablet (81 mg total) by mouth daily. Hold for 5 days (Patient taking differently: Take 81 mg by mouth daily. ), Disp: 30 tablet, Rfl:  .  atorvastatin (LIPITOR) 80 MG tablet, Take 80 mg by mouth daily., Disp: , Rfl:  .  betamethasone dipropionate 0.05 % lotion, Apply topically daily. Apply to affected area daily, Disp: , Rfl:  .  bisoprolol (ZEBETA) 5 MG tablet, Take 1 tablet (5 mg total) by mouth daily., Disp: 60 tablet, Rfl: 11 .  furosemide (LASIX) 40 MG tablet, Take 1 tablet (40 mg total) by mouth daily., Disp: 90 tablet, Rfl: 3 .  glimepiride (AMARYL) 1 MG tablet, Take 1 tablet by mouth daily., Disp: , Rfl:  .  ipratropium-albuterol (DUONEB) 0.5-2.5 (3) MG/3ML SOLN, Take 3 mLs by nebulization every 4 (four) hours as needed., Disp: 360 mL, Rfl: 0 .  IRON PO, Take 1 tablet by mouth daily., Disp: , Rfl:  .  ketoconazole (NIZORAL) 2 % shampoo, AS DIRECTED, Disp: , Rfl:  .  levothyroxine (SYNTHROID, LEVOTHROID) 75 MCG tablet, Take 75 mcg by mouth daily before breakfast., Disp: , Rfl:  .  metFORMIN (GLUCOPHAGE) 1000 MG tablet, Take 1,000 mg by mouth daily with breakfast., Disp: , Rfl:  .  OXYGEN, 2.5 lpm with sleep only AHC, Disp: , Rfl:  .  pantoprazole (PROTONIX) 40 MG tablet, Take 1 tablet (40 mg total) by mouth 2 (two) times daily before a meal., Disp: 60 tablet,  Rfl: 2 .  Turmeric 500 MG TABS, Take 500 mg by mouth daily., Disp: , Rfl:   Past Medical History: Past Medical History:  Diagnosis Date  . A-fib ([place])   . CAD (coronary artery disease)    a. 2006: CABG in 2006 with LIMA-LAD, SVG-OM1, and SVG-RPDA  . Chronic pain 03/21/2016  . Essential hypertension 03/21/2016  . History of prostate cancer 03/21/2016  . HLD (hyperlipidemia)   . HTN (hypertension)   . Hx of CABG 2006  . Hypercholesteremia 03/21/2016  . Hypothyroidism   . Medication management 03/21/2016  . Morbid obesity ([place]) 03/21/2016  . Myocardial infarct ([place])   . OSA (obstructive sleep apnea) 03/21/2016  . Pain in right ankle and joints of right foot 03/21/2016  . Paronychia 03/21/2016  . Primary insomnia 03/21/2016  . Prostate cancer ([place]) 2008  . Tobacco dependence 03/21/2016    Tobacco Use: Social History   Tobacco Use  Smoking Status Former Smoker  . Packs/day: 0.50  . Years: 60.00  . Pack years: 30.00  . Types: Cigarettes  . Last attempt to quit: 12/15/2016  . Years since quitting: 1.1  Smokeless Tobacco Never Used    Labs: Recent Review Scientist, physiological    Labs for ITP Cardiac and Pulmonary Rehab Latest Ref Rng & Units 05/05/2016 02/25/2017 02/25/2017   HCO3 20.0 - 28.0 mmol/L - 36.1(H) -  TCO2 0 - 100 mmol/L 26 38 33   O2SAT % - 86.0 -      Capillary Blood Glucose: Lab Results  Component Value Date   GLUCAP 108 (H) 01/21/2018   GLUCAP 138 (H) 10/29/2017   GLUCAP 236 (H) 10/29/2017   GLUCAP 118 (H) 10/17/2017   GLUCAP 163 (H) 10/08/2017   POCT Glucose    Row Name 11/28/17 1239 12/10/17 1423 12/12/17 1242 12/19/17 1228 12/24/17 1243     POCT Blood Glucose   Pre-Exercise  196 mg/dL  196 mg/dL  236 mg/dL  204 mg/dL  196 mg/dL   Post-Exercise  167 mg/dL  218 mg/dL  240 mg/dL  116 mg/dL  136 mg/dL   Row Name 01/07/18 1224 01/21/18 1217           POCT Blood Glucose   Pre-Exercise  172 mg/dL  108 mg/dL      Post-Exercise  156 mg/dL  -         Pulmonary  Assessment Scores: Pulmonary Assessment Scores    Row Name 01/21/18 1259 01/21/18 1321       ADL UCSD   ADL Phase  Exit  Exit    SOB Score total  -  31      CAT Score   CAT Score  -  24 Exit      mMRC Score   mMRC Score  0  -       Pulmonary Function Assessment:   Exercise Target Goals:    Exercise Program Goal: Individual exercise prescription set using results from initial 6 min walk test and THRR while considering  patient's activity barriers and safety.    Exercise Prescription Goal: Initial exercise prescription builds to 30-45 minutes a day of aerobic activity, 2-3 days per week.  Home exercise guidelines will be given to patient during program as part of exercise prescription that the participant will acknowledge.  Activity Barriers & Risk Stratification:   6 Minute Walk: 6 Minute Walk    Row Name 01/21/18 1257         6 Minute Walk   Phase  Discharge     Distance  1200 feet     Distance Feet Change  200 ft     Walk Time  6 minutes     # of Rest Breaks  0     MPH  2.27     METS  2.76     RPE  13     Perceived Dyspnea   0     Symptoms  No     Comments  7/10 ankle pain     Resting HR  74 bpm     Resting BP  134/54     Resting Oxygen Saturation   92 %     Exercise Oxygen Saturation  during 6 min walk  0 %     Max Ex. HR  92 bpm     Max Ex. BP  155/71       Interval HR   1 Minute HR  80     2 Minute HR  81     3 Minute HR  82     4 Minute HR  82     5 Minute HR  90     6 Minute HR  92     2 Minute Post HR  85     Interval Heart Rate?  Yes       Interval Oxygen   Interval  Oxygen?  Yes     Baseline Oxygen Saturation %  92 %     1 Minute Oxygen Saturation %  92 %     1 Minute Liters of Oxygen  0 L     2 Minute Oxygen Saturation %  89 %     2 Minute Liters of Oxygen  0 L     3 Minute Oxygen Saturation %  91 %     3 Minute Liters of Oxygen  0 L     4 Minute Oxygen Saturation %  91 %     4 Minute Liters of Oxygen  0 L     5 Minute Oxygen  Saturation %  92 %     5 Minute Liters of Oxygen  0 L     6 Minute Oxygen Saturation %  93 %     6 Minute Liters of Oxygen  0 L     2 Minute Post Oxygen Saturation %  92 %     2 Minute Post Liters of Oxygen  0 L        Oxygen Initial Assessment:   Oxygen Re-Evaluation: Oxygen Re-Evaluation    Row Name 11/28/17 0730 12/23/17 1514 01/10/18 0740         Program Oxygen Prescription   Program Oxygen Prescription  None  None  None       Home Oxygen   Home Oxygen Device  Home Concentrator;E-Tanks  Home Concentrator;E-Tanks  Home Concentrator;E-Tanks     Sleep Oxygen Prescription  CPAP;Continuous  CPAP;Continuous  CPAP;Continuous     Liters per minute  2.5  2.5  2.5     Home Exercise Oxygen Prescription  None  None  None     Liters per minute  0  0  0     Home at Rest Exercise Oxygen Prescription  None  None  None     Compliance with Home Oxygen Use  No  No  No       Goals/Expected Outcomes   Short Term Goals  To learn and exhibit compliance with exercise, home and travel O2 prescription;To learn and understand importance of monitoring SPO2 with pulse oximeter and demonstrate accurate use of the pulse oximeter.;To learn and understand importance of maintaining oxygen saturations>88%;To learn and demonstrate proper pursed lip breathing techniques or other breathing techniques.;To learn and demonstrate proper use of respiratory medications  To learn and exhibit compliance with exercise, home and travel O2 prescription;To learn and understand importance of monitoring SPO2 with pulse oximeter and demonstrate accurate use of the pulse oximeter.;To learn and understand importance of maintaining oxygen saturations>88%;To learn and demonstrate proper pursed lip breathing techniques or other breathing techniques.;To learn and demonstrate proper use of respiratory medications  To learn and exhibit compliance with exercise, home and travel O2 prescription;To learn and understand importance of  monitoring SPO2 with pulse oximeter and demonstrate accurate use of the pulse oximeter.;To learn and understand importance of maintaining oxygen saturations>88%;To learn and demonstrate proper pursed lip breathing techniques or other breathing techniques.;To learn and demonstrate proper use of respiratory medications     Long  Term Goals  Exhibits compliance with exercise, home and travel O2 prescription;Maintenance of O2 saturations>88%;Compliance with respiratory medication;Verbalizes importance of monitoring SPO2 with pulse oximeter and return demonstration;Exhibits proper breathing techniques, such as pursed lip breathing or other method taught during program session  Exhibits compliance with exercise, home and travel O2 prescription;Maintenance of O2 saturations>88%;Compliance with respiratory medication;Verbalizes importance of monitoring SPO2  with pulse oximeter and return demonstration;Exhibits proper breathing techniques, such as pursed lip breathing or other method taught during program session  Exhibits compliance with exercise, home and travel O2 prescription;Maintenance of O2 saturations>88%;Compliance with respiratory medication;Verbalizes importance of monitoring SPO2 with pulse oximeter and return demonstration;Exhibits proper breathing techniques, such as pursed lip breathing or other method taught during program session     Comments  patient does not require oxygen during exercise and able to maintain saturations >93% with exertion.  patient does not require oxygen during exercise and able to maintain saturations >93% with exertion.  patient does not require oxygen during exercise and able to maintain saturations >93% with exertion.        Oxygen Discharge (Final Oxygen Re-Evaluation): Oxygen Re-Evaluation - 01/10/18 0740      Program Oxygen Prescription   Program Oxygen Prescription  None      Home Oxygen   Home Oxygen Device  Home Concentrator;E-Tanks    Sleep Oxygen  Prescription  CPAP;Continuous    Liters per minute  2.5    Home Exercise Oxygen Prescription  None    Liters per minute  0    Home at Rest Exercise Oxygen Prescription  None    Compliance with Home Oxygen Use  No      Goals/Expected Outcomes   Short Term Goals  To learn and exhibit compliance with exercise, home and travel O2 prescription;To learn and understand importance of monitoring SPO2 with pulse oximeter and demonstrate accurate use of the pulse oximeter.;To learn and understand importance of maintaining oxygen saturations>88%;To learn and demonstrate proper pursed lip breathing techniques or other breathing techniques.;To learn and demonstrate proper use of respiratory medications    Long  Term Goals  Exhibits compliance with exercise, home and travel O2 prescription;Maintenance of O2 saturations>88%;Compliance with respiratory medication;Verbalizes importance of monitoring SPO2 with pulse oximeter and return demonstration;Exhibits proper breathing techniques, such as pursed lip breathing or other method taught during program session    Comments  patient does not require oxygen during exercise and able to maintain saturations >93% with exertion.       Initial Exercise Prescription:   Perform Capillary Blood Glucose checks as needed.  Exercise Prescription Changes: Exercise Prescription Changes    Row Name 11/28/17 1236 12/10/17 1400 12/12/17 1200 12/19/17 1200 12/24/17 1200     Response to Exercise   Blood Pressure (Admit)  124/70  130/80  114/52  118/60  112/64   Blood Pressure (Exercise)  132/70  138/62  142/80  124/56  136/70   Blood Pressure (Exit)  98/62  112/60  100/64  100/54  126/64   Heart Rate (Admit)  86 bpm  75 bpm  68 bpm  76 bpm  71 bpm   Heart Rate (Exercise)  91 bpm  92 bpm  92 bpm  90 bpm  84 bpm   Heart Rate (Exit)  78 bpm  78 bpm  71 bpm  82 bpm  65 bpm   Oxygen Saturation (Admit)  93 %  90 %  92 %  91 %  92 %   Oxygen Saturation (Exercise)  91 %  92 %  92  %  93 %  92 %   Oxygen Saturation (Exit)  93 %  89 %  93 %  93 %  93 %   Rating of Perceived Exertion (Exercise)  '13  13  13  13  13   ' Perceived Dyspnea (Exercise)  0  0  0  0  1   Duration  Progress to 45 minutes of aerobic exercise without signs/symptoms of physical distress  Progress to 45 minutes of aerobic exercise without signs/symptoms of physical distress  Progress to 45 minutes of aerobic exercise without signs/symptoms of physical distress  Progress to 45 minutes of aerobic exercise without signs/symptoms of physical distress  Progress to 45 minutes of aerobic exercise without signs/symptoms of physical distress   Intensity  THRR unchanged  THRR unchanged  THRR unchanged  THRR unchanged  THRR unchanged     Progression   Progression  Continue to progress workloads to maintain intensity without signs/symptoms of physical distress.  Continue to progress workloads to maintain intensity without signs/symptoms of physical distress.  Continue to progress workloads to maintain intensity without signs/symptoms of physical distress.  Continue to progress workloads to maintain intensity without signs/symptoms of physical distress.  Continue to progress workloads to maintain intensity without signs/symptoms of physical distress.     Resistance Training   Training Prescription  Yes  Yes  Yes  Yes  Yes   Weight  blue bands  blue bands  blue bands  blue bands  blue bands   Reps  10-15  10-15  10-15  10-15  10-15   Time  10 Minutes  10 Minutes  10 Minutes  10 Minutes  10 Minutes     Interval Training   Interval Training  No  No  No  No  No     Recumbant Bike   Level  4  3.5  4  -  4   Minutes  '17  17  17  ' -  17     NuStep   Level  '5  5  5  5  5   ' Minutes  '17  17  17  17  17   ' METs  2.1  1.9  2.4  2.4  2.2     Track   Laps  11  12  -  15  13   Minutes  17  17  -  17  17     Home Exercise Plan   Plans to continue exercise at  -  Home (comment)  -  -  -   Frequency  -  Add 3 additional days  to program exercise sessions.  -  -  -   Row Name 12/26/17 1200 12/31/17 1200 01/07/18 1200 01/21/18 1208       Response to Exercise   Blood Pressure (Admit)  124/50  140/70  120/64  134/54    Blood Pressure (Exercise)  130/70  156/68  130/60  160/72    Blood Pressure (Exit)  120/64  100/60  118/64  110/60    Heart Rate (Admit)  73 bpm  73 bpm  81 bpm  74 bpm    Heart Rate (Exercise)  94 bpm  91 bpm  91 bpm  79 bpm    Heart Rate (Exit)  68 bpm  76 bpm  79 bpm  69 bpm    Oxygen Saturation (Admit)  94 %  90 %  93 %  92 %    Oxygen Saturation (Exercise)  91 %  94 %  92 %  91 %    Oxygen Saturation (Exit)  92 %  93 %  90 %  89 %    Rating of Perceived Exertion (Exercise)  '13  13  13  12    ' Perceived Dyspnea (Exercise)  0  0  0  0    Duration  Progress to 45 minutes of aerobic exercise without signs/symptoms of physical distress  Progress to 45 minutes of aerobic exercise without signs/symptoms of physical distress  Progress to 45 minutes of aerobic exercise without signs/symptoms of physical distress  Progress to 45 minutes of aerobic exercise without signs/symptoms of physical distress    Intensity  THRR unchanged  THRR unchanged  THRR unchanged  THRR unchanged      Progression   Progression  Continue to progress workloads to maintain intensity without signs/symptoms of physical distress.  Continue to progress workloads to maintain intensity without signs/symptoms of physical distress.  Continue to progress workloads to maintain intensity without signs/symptoms of physical distress.  Continue to progress workloads to maintain intensity without signs/symptoms of physical distress.      Resistance Training   Training Prescription  Yes  Yes  Yes  Yes    Weight  blue bands  blue bands  blue bands  blue bands    Reps  10-15  10-15  10-15  10-15    Time  10 Minutes  10 Minutes  10 Minutes  10 Minutes      Interval Training   Interval Training  No  No  No  No      Recumbant Bike   Level  '4  4   4  4    ' Minutes  '17  17  17  17      ' NuStep   Level  -  '5  5  5    ' Minutes  -  '17  17  17    ' METs  -  2.5  2.1  2.4      Track   Laps  '14  14  12  ' -    Minutes  '17  17  17  ' -       Exercise Comments: Exercise Comments    Row Name 12/12/17 0734           Exercise Comments  Home exercise completed          Exercise Goals and Review:   Exercise Goals Re-Evaluation : Exercise Goals Re-Evaluation    Row Name 12/23/17 0735 01/10/18 0741           Exercise Goal Re-Evaluation   Exercise Goals Review  Able to understand and use Dyspnea scale;Increase Strength and Stamina;Increase Physical Activity;Able to understand and use rate of perceived exertion (RPE) scale;Knowledge and understanding of Target Heart Rate Range (THRR);Understanding of Exercise Prescription  Able to understand and use Dyspnea scale;Increase Strength and Stamina;Increase Physical Activity;Able to understand and use rate of perceived exertion (RPE) scale;Knowledge and understanding of Target Heart Rate Range (THRR);Understanding of Exercise Prescription      Comments  Patient is slowly progressing. Is able to walk 15 laps (200 ft each) in 15 minutes. Diet and lifestyle have no seemed to improve. Home exercise completed. Will cont. to encourage.   Patient is graduating on 01/14/18. Will cont to exercise at the [facility]. States he is motivated to lose weight but has not made many efforts to do so. Encouraged to exercise at least 5 days a week for weight loss.       Expected Outcomes  Through exercise at rehab and at home, patient will increase strength and stamina and be less short of breath with ADL's at home.   Through exercise at rehab and at home, patient will increase strength and stamina and be less short of breath  with ADL's at home.          Discharge Exercise Prescription (Final Exercise Prescription Changes): Exercise Prescription Changes - 01/21/18 1208      Response to Exercise   Blood Pressure (Admit)   134/54    Blood Pressure (Exercise)  160/72    Blood Pressure (Exit)  110/60    Heart Rate (Admit)  74 bpm    Heart Rate (Exercise)  79 bpm    Heart Rate (Exit)  69 bpm    Oxygen Saturation (Admit)  92 %    Oxygen Saturation (Exercise)  91 %    Oxygen Saturation (Exit)  89 %    Rating of Perceived Exertion (Exercise)  12    Perceived Dyspnea (Exercise)  0    Duration  Progress to 45 minutes of aerobic exercise without signs/symptoms of physical distress    Intensity  THRR unchanged      Progression   Progression  Continue to progress workloads to maintain intensity without signs/symptoms of physical distress.      Resistance Training   Training Prescription  Yes    Weight  blue bands    Reps  10-15    Time  10 Minutes      Interval Training   Interval Training  No      Recumbant Bike   Level  4    Minutes  17      NuStep   Level  5    Minutes  17    METs  2.4       Nutrition:  Target Goals: Understanding of nutrition guidelines, daily intake of sodium <1534m, cholesterol <2062m calories 30% from fat and 7% or less from saturated fats, daily to have 5 or more servings of fruits and vegetables.  Biometrics:    Nutrition Therapy Plan and Nutrition Goals:   Nutrition Assessments: Nutrition Assessments - 01/21/18 1505      Rate Your Plate Scores   Pre Score  57    Post Score  -- pt returned survey incomplete       Nutrition Goals Re-Evaluation: Nutrition Goals Re-Evaluation    [place] 01/23/18 1019             Goals   Current Weight  248 lb 3.8 oz (112.6 kg)       Nutrition Goal  Identify food quantities necessary to achieve wt loss of  -2# per week to a goal wt loss of 6-24 lb at graduation from pulmonary rehab.       Comment  Pt maintained his wt during Pulmonary Rehab. Wt loss goal not met. No recent A1c noted to evaluate goal #2.         Personal Goal #2 Re-Evaluation   Personal Goal #2  CBG's in the normal range or as close to normal as is  safely possible.          Nutrition Goals Discharge (Final Nutrition Goals Re-Evaluation): Nutrition Goals Re-Evaluation - 01/23/18 1019      Goals   Current Weight  248 lb 3.8 oz (112.6 kg)    Nutrition Goal  Identify food quantities necessary to achieve wt loss of  -2# per week to a goal wt loss of 6-24 lb at graduation from pulmonary rehab.    Comment  Pt maintained his wt during Pulmonary Rehab. Wt loss goal not met. No recent A1c noted to evaluate goal #2.      Personal Goal #2 Re-Evaluation   Personal Goal #  2  CBG's in the normal range or as close to normal as is safely possible.       Psychosocial: Target Goals: Acknowledge presence or absence of significant depression and/or stress, maximize coping skills, provide positive support system. Participant is able to verbalize types and ability to use techniques and skills needed for reducing stress and depression.  Initial Review & Psychosocial Screening:   Quality of Life Scores:  Scores of 19 and below usually indicate a poorer quality of life in these areas.  A difference of  2-3 points is a clinically meaningful difference.  A difference of 2-3 points in the total score of the Quality of Life Index has been associated with significant improvement in overall quality of life, self-image, physical symptoms, and general health in studies assessing change in quality of life.   PHQ-9: Recent Review Flowsheet Data    Depression screen [facility] 2/9 09/27/2017 06/25/2017   Decreased Interest 0 0   Down, Depressed, Hopeless 0 0   PHQ - 2 Score 0 0     Interpretation of Total Score  Total Score Depression Severity:  1-4 = Minimal depression, 5-9 = Mild depression, 10-14 = Moderate depression, 15-19 = Moderately severe depression, 20-27 = Severe depression   Psychosocial Evaluation and Intervention:   Psychosocial Re-Evaluation: Psychosocial Re-Evaluation    [place] Name 11/28/17 0734 12/23/17 1156 01/23/18 1659          Psychosocial Re-Evaluation   Current issues with  None Identified  None Identified  None Identified     Comments  patient has attended 90% of exercise sessions since admission  -  patient has attended 90% of exercise sessions since admission. He was free from psychosocial barriers at discharge.     Expected Outcomes  patient will remain free from psychosocial barriers to exercise in pulmonary rehab  patient will remain free from psychosocial barriers to exercise in pulmonary rehab  -     Continue Psychosocial Services   No Follow up required  No Follow up required  -        Psychosocial Discharge (Final Psychosocial Re-Evaluation): Psychosocial Re-Evaluation - 01/23/18 1659      Psychosocial Re-Evaluation   Current issues with  None Identified    Comments  patient has attended 90% of exercise sessions since admission. He was free from psychosocial barriers at discharge.       Education: Education Goals: Education classes will be provided on a weekly basis, covering required topics. Participant will state understanding/return demonstration of topics presented.  Learning Barriers/Preferences:   Education Topics: Risk Factor Reduction:  -Group instruction that is supported by a PowerPoint presentation. Instructor discusses the definition of a risk factor, different risk factors for pulmonary disease, and how the heart and lungs work together.     Nutrition for Pulmonary Patient:  -Group instruction provided by PowerPoint slides, verbal discussion, and written materials to support subject matter. The instructor gives an explanation and review of healthy diet recommendations, which includes a discussion on weight management, recommendations for fruit and vegetable consumption, as well as protein, fluid, caffeine, fiber, sodium, sugar, and alcohol. Tips for eating when patients are short of breath are discussed.   Pursed Lip Breathing:  -Group instruction that is supported by demonstration  and informational handouts. Instructor discusses the benefits of pursed lip and diaphragmatic breathing and detailed demonstration on how to preform both.     Oxygen Safety:  -Group instruction provided by PowerPoint, verbal discussion, and written material to support subject  matter. There is an overview of "What is Oxygen" and "Why do we need it".  Instructor also reviews how to create a safe environment for oxygen use, the importance of using oxygen as prescribed, and the risks of noncompliance. There is a brief discussion on traveling with oxygen and resources the patient may utilize.   Oxygen Equipment:  -Group instruction provided by [facility] Staff utilizing handouts, written materials, and equipment demonstrations.   PULMONARY REHAB CHRONIC OBSTRUCTIVE PULMONARY DISEASE from 12/26/2017 in [place]  Date  10/31/17  Educator  [employer]  Instruction Review Code (Retired)  2- meets goals/outcomes      Signs and Symptoms:  -Group instruction provided by written material and verbal discussion to support subject matter. Warning signs and symptoms of infection, stroke, and heart attack are reviewed and when to call the physician/911 reinforced. Tips for preventing the spread of infection discussed.   Advanced Directives:  -Group instruction provided by verbal instruction and written material to support subject matter. Instructor reviews Advanced Directive laws and proper instruction for filling out document.   Pulmonary Video:  -Group video education that reviews the importance of medication and oxygen compliance, exercise, good nutrition, pulmonary hygiene, and pursed lip and diaphragmatic breathing for the pulmonary patient.   Exercise for the Pulmonary Patient:  -Group instruction that is supported by a PowerPoint presentation. Instructor discusses benefits of exercise, core components of exercise, frequency, duration, and intensity of an  exercise routine, importance of utilizing pulse oximetry during exercise, safety while exercising, and options of places to exercise outside of rehab.     PULMONARY REHAB CHRONIC OBSTRUCTIVE PULMONARY DISEASE from 12/26/2017 in [place]  Date  10/17/17  Educator  Cloyde Reams  Instruction Review Code (Retired)  2- meets goals/outcomes      Pulmonary Medications:  -Engineer, maintenance (IT) group education provided by Art therapist with focus on inhaled medications and proper administration.   PULMONARY REHAB CHRONIC OBSTRUCTIVE PULMONARY DISEASE from 12/26/2017 in [place]  Date  12/19/17  Educator  Pharm  Instruction Review Code (Retired)  2- Lawyer and Physiology of the Respiratory System and Intimacy:  -Group instruction provided by PowerPoint, verbal discussion, and written material to support subject matter. Instructor reviews respiratory cycle and anatomical components of the respiratory system and their functions. Instructor also reviews differences in obstructive and restrictive respiratory diseases with examples of each. Intimacy, Sex, and Sexuality differences are reviewed with a discussion on how relationships can change when diagnosed with pulmonary disease. Common sexual concerns are reviewed.   PULMONARY REHAB CHRONIC OBSTRUCTIVE PULMONARY DISEASE from 12/26/2017 in [place]  Date  11/21/17  Educator  RN  Instruction Review Code (Retired)  2- meets goals/outcomes      MD DAY -A group question and answer session with a medical doctor that allows participants to ask questions that relate to their pulmonary disease state.   PULMONARY REHAB CHRONIC OBSTRUCTIVE PULMONARY DISEASE from 12/26/2017 in [place]  Date  12/12/17  Educator  yacoub  Instruction Review Code (Retired)  2- meets goals/outcomes      OTHER EDUCATION -Group or  individual verbal, written, or video instructions that support the educational goals of the pulmonary rehab program.   PULMONARY REHAB CHRONIC OBSTRUCTIVE PULMONARY DISEASE from 12/26/2017 in [place]  Date  12/26/17  Educator  EP  Instruction Review  Code  1- Verbalizes Understanding      Holiday Eating Survival Tips:  -Group instruction provided by PowerPoint slides, verbal discussion, and written materials to support subject matter. The instructor gives patients tips, tricks, and techniques to help them not only survive but enjoy the holidays despite the onslaught of food that accompanies the holidays.   Knowledge Questionnaire Score:   Core Components/Risk Factors/Patient Goals at Admission:   Core Components/Risk Factors/Patient Goals Review:  Goals and Risk Factor Review    Row Name 11/28/17 0731 12/23/17 1145 01/14/18 1239 01/23/18 1658       Core Components/Risk Factors/Patient Goals Review   Personal Goals Review  Weight Management/Obesity;Improve shortness of breath with ADL's;Develop more efficient breathing techniques such as purse lipped breathing and diaphragmatic breathing and practicing self-pacing with activity.;Diabetes;Heart Failure  Weight Management/Obesity;Improve shortness of breath with ADL's;Develop more efficient breathing techniques such as purse lipped breathing and diaphragmatic breathing and practicing self-pacing with activity.;Diabetes;Heart Failure  Weight Management/Obesity;Improve shortness of breath with ADL's;Develop more efficient breathing techniques such as purse lipped breathing and diaphragmatic breathing and practicing self-pacing with activity.;Diabetes;Heart Failure  Weight Management/Obesity;Improve shortness of breath with ADL's;Develop more efficient breathing techniques such as purse lipped breathing and diaphragmatic breathing and practicing self-pacing with activity.;Diabetes;Heart Failure    Review  Patient  is slowly progressing towards pulmonary rehab goals. He continues to be limited by ankle pain from a previous surgery. This pain limits his ability to work to his maximum cardiopulmonary fitness ability. Patient is doing much better on maintaining a  diabetic diet and taking responsibility of having the correct working equiptment/supplies at each exercise session. Patients weight remains stable although his goal is to begin a significant weightloss lifestyle. Will continue to coach patient with goals and help him identify barriers to obtaining goals.  Patient continues to progress slowly towards pulmonary rehab goals. His ankle pain will always be a barrier until he decides to have corrective surgery. He continues to bring his own diabetic meter and remember to check and report his sugars prior to exercise. He has had to have reminders regarding appropriate diet prior to exercise. He tends to carb load and subsiquently his CBGs are too high to exercise. He has done better the last two exercise sessions eating a more well balanced diet with protein and fat. Patients weight remains stable.   patient has met his pulmonary rehab goals of improving his shortness of breath with ADLs and utilizing PLB. He still has not made any progress in weight loss and his diabetes is not well controlled related to diet. He has been absent at the last two scheduled exercise sessions related to a head cold. he is scheduled to graduate at his next session.  patient graduated pulmonary rehab on 01/21/18.     Expected Outcomes  see admission outcomes  see admission outcomes  see admission outcomes  see admission outcomes       Core Components/Risk Factors/Patient Goals at Discharge (Final Review):  Goals and Risk Factor Review - 01/23/18 1658      Core Components/Risk Factors/Patient Goals Review   Personal Goals Review  Weight Management/Obesity;Improve shortness of breath with ADL's;Develop more efficient breathing techniques such  as purse lipped breathing and diaphragmatic breathing and practicing self-pacing with activity.;Diabetes;Heart Failure    Review  patient graduated pulmonary rehab on 01/21/18.     Expected Outcomes  see admission outcomes       ITP Comments:   Comments: Patient plans to continue his home exercise by  walking.

## 2018-01-28 ENCOUNTER — Encounter (HOSPITAL_COMMUNITY): Payer: Federal, State, Local not specified - PPO

## 2018-01-30 DIAGNOSIS — I5033 Acute on chronic diastolic (congestive) heart failure: Secondary | ICD-10-CM | POA: Diagnosis not present

## 2018-02-18 ENCOUNTER — Other Ambulatory Visit: Payer: Self-pay | Admitting: Internal Medicine

## 2018-02-24 ENCOUNTER — Ambulatory Visit: Payer: Federal, State, Local not specified - PPO | Admitting: Internal Medicine

## 2018-02-25 ENCOUNTER — Ambulatory Visit: Payer: Federal, State, Local not specified - PPO | Admitting: Internal Medicine

## 2018-02-27 ENCOUNTER — Ambulatory Visit: Payer: Federal, State, Local not specified - PPO | Admitting: Internal Medicine

## 2018-03-02 DIAGNOSIS — I5033 Acute on chronic diastolic (congestive) heart failure: Secondary | ICD-10-CM | POA: Diagnosis not present

## 2018-03-14 ENCOUNTER — Telehealth: Payer: Self-pay

## 2018-03-14 NOTE — Telephone Encounter (Signed)
I've played "phone tag" w/Jeremiah [name] for a week or so.  We've finally gotten to speak and he will join the PREP w/his wife.  They will come in to register for the program on Thurs. 4/18 at 9am.

## 2018-03-17 ENCOUNTER — Encounter: Payer: Self-pay | Admitting: Internal Medicine

## 2018-03-17 ENCOUNTER — Ambulatory Visit: Payer: Federal, State, Local not specified - PPO | Admitting: Internal Medicine

## 2018-03-17 VITALS — BP 112/64 | HR 70 | Ht 66.0 in | Wt 250.0 lb

## 2018-03-17 DIAGNOSIS — J9612 Chronic respiratory failure with hypercapnia: Secondary | ICD-10-CM

## 2018-03-17 DIAGNOSIS — J9611 Chronic respiratory failure with hypoxia: Secondary | ICD-10-CM | POA: Diagnosis not present

## 2018-03-17 DIAGNOSIS — J449 Chronic obstructive pulmonary disease, unspecified: Secondary | ICD-10-CM | POA: Diagnosis not present

## 2018-03-17 NOTE — Assessment & Plan Note (Signed)
Quit smoking 12/2016 Spirometry 02/12/2017  FEV1 0.79 (20%)  Ratio 45 but f/v loop is junk - trial of selective BB 02/12/2017  - PFT's  04/01/2017  FEV1 1.29 ( 44% ) ratio 55  p no % improvement from saba p ?  prior to study with DLCO  65/67 % corrects to 85  % for alv volume  - 05/02/2017  After extensive coaching HFA effectiveness =    75% > try bevespi samples x 2 weeks  > no objective change so did not continue  - Referred to rehab 08/26/2017    Did well in rehab to point where does not need any resp rx or f/u either for that matter  Ok to use saba prn

## 2018-03-17 NOTE — Assessment & Plan Note (Addendum)
Body mass index is 40.35 kg/m.  -  trending down slight/ encouraged Lab Results  Component Value Date   TSH 2.448 02/26/2017    I had an extended final summary discussion with the patient reviewing all relevant studies completed to date and  lasting 15 to 20 minutes of a 25 minute visit on the following issues:   Obesity contributing to gerd risk/ doe/reviewed the need and the process to achieve and maintain neg calorie balance > defer f/u primary care including intermittently monitoring thyroid status.   Each maintenance medication was reviewed in detail including most importantly the difference between maintenance and as needed and under what circumstances the prns are to be used.  Please see AVS for specific  Instructions which are unique to this visit and I personally typed out  which were reviewed in detail in writing with the patient and a copy provided.     Pulmonary f/u is prn

## 2018-03-17 NOTE — Progress Notes (Signed)
Subjective:     Patient ID: Jeremiah [name], male   DOB: 1946/05/15,    MRN: [id number]     Brief patient profile:  50 yowm quit smoking 12/15/16 with onset of doe x 2008    Admit date: 01/02/2017 Discharge date: 01/05/2017  Primary Care Physician:  [name], MD  Discharge Diagnoses:    . Upper GI bleed . Enteritis due to Clostridium difficile . SVT (supraventricular tachycardia) ([place]) . Morbid obesity ([place]) . Essential hypertension . OSA (obstructive sleep apnea) . Symptomatic anemia . Hypothyroidism, acquired      History of Present Illness   02/12/2017 1st [facility] office visit/ Armond Cuthrell  Re ? Copd  Chief Complaint  Patient presents with  . Pulmonary Consult    Referred by Dr. Dorthy Cooler. Pt c/o SOB for the past 10 yrs. He gets SOB when running and working "which I don't do anymore".   MMRC2 = can't walk a nl pace on a flat grade s sob but does fine slow and flat eg shopping / limited also by R chronic ankle pain p fx rec Stop lopressor (metaprolol) and start bisoprolol 5 mg twice daily  If face/eye symptoms  worse or fever go back to ER  Please schedule a follow up office visit in 4 weeks, sooner if needed with pfts on return    04/01/2017  f/u ov/Denaly Gatling re: copd gold III/ 02 2lpm  Chief Complaint  Patient presents with  . Follow-up    Breathing is doing well. No new co's today.    Goal is to walk dogs beyond the end of property line but can't presently due to sob   rec Protonix Take 30- 60 min before your first and last meals of the day  GERD diet  Please schedule a follow up office visit in 4 weeks, sooner if needed  with all medications /inhalers/ solutions in hand so we can verify exactly what you are taking. This includes all medications from all doctors and over the counters    05/02/2017  f/u ov/Rosali Augello re: COPD GOLD III / did not bring meds as requested but maint on duoneb only  Chief Complaint  Patient presents with  . Follow-up    Pt states "I still  sound like a frog and I still snore"- no new co's today. He is requesting order for POC for travel. He only uses o2 with sleep. He also requestes humidity to be added to his o2.   sleeps fine on 2lpm s am ha / not consistent with amb 02  Doe = MMRC2 = can't walk a nl pace on a flat grade s sob but does fine slow and flat on 02 and wants POC rec 02 2lpm at bedtime with humidity which we will arrange  Please see patient coordinator before you leave today  to schedule ambulatory 02 titration to see if eligible for POC    08/26/2017  f/u ov/Vara Mairena re:  Out of bevespi x one month -  no change in symptoms / 02 2lpm hs and prn  Chief Complaint  Patient presents with  . Follow-up    Breathing is doing well. He uses Duoneb 2 x per wk on average.  still doe x MMRC2 = can't walk a nl pace on a flat grade s sob but does fine slow and flat eg shopping with /without 02 and says sats vary, typically sits down once sob and then use 02 to catch his breath rather than with ex as rec  Uses  2lpm hs and sleeps fine flat  Only uses duoenb when over does it, not noct or at rest typically  rec 02 2lpm at bedtime and with sustained  Walking  - it will help you lose wt to keep your 02 above 90% while exerting  Please see patient coordinator before you leave today  to schedule pulmonary rehab     03/17/2018  f/u ov/Araiyah Cumpton re:  GOLD III/ 02 2lpm hs and no inhalers/ not using nebs  Chief Complaint  Patient presents with  . Follow-up    Breathing has improved since last seen here. He is using his neb approx 1 x per month.    Dyspnea:  Better to point where out planting trees = MMRC1 = can walk nl pace, flat grade, can't hurry or go uphills or steps s sob   Cough: no Sleep: fine on 2lpm  SABA use:  Very rare   No obvious day to day or daytime variability or assoc excess/ purulent sputum or mucus plugs or hemoptysis or cp or chest tightness, subjective wheeze or overt sinus or hb symptoms. No unusual exposure hx or h/o  childhood pna/ asthma or knowledge of premature birth.  Sleeping  Ok on 2lpm   without nocturnal  or early am exacerbation  of respiratory  c/o's or need for noct saba. Also denies any obvious fluctuation of symptoms with weather or environmental changes or other aggravating or alleviating factors except as outlined above   Current Allergies, Complete Past Medical History, Past Surgical History, Family History, and Social History were reviewed in [employer] record.  ROS  The following are not active complaints unless bolded Hoarseness, sore throat, dysphagia, dental problems, itching, sneezing,  nasal congestion or discharge of excess mucus or purulent secretions, ear ache,   fever, chills, sweats, unintended wt loss or wt gain, classically pleuritic or exertional cp,  orthopnea pnd or arm/hand swelling  or leg swelling, presyncope, palpitations, abdominal pain, anorexia, nausea, vomiting, diarrhea  or change in bowel habits or change in bladder habits, change in stools or change in urine, dysuria, hematuria,  rash, arthralgias, visual complaints, headache, numbness, weakness or ataxia or problems with walking or coordination,  change in mood or  memory.        Current Meds  Medication Sig  . aspirin 81 MG tablet Take 1 tablet (81 mg total) by mouth daily. Hold for 5 days (Patient taking differently: Take 81 mg by mouth daily. )  . atorvastatin (LIPITOR) 80 MG tablet Take 80 mg by mouth daily.  . betamethasone dipropionate 0.05 % lotion Apply topically daily. Apply to affected area daily  . bisoprolol (ZEBETA) 5 MG tablet Take 1 tablet (5 mg total) by mouth daily.  Marland Kitchen glimepiride (AMARYL) 1 MG tablet Take 1 tablet by mouth daily.  Marland Kitchen ipratropium-albuterol (DUONEB) 0.5-2.5 (3) MG/3ML SOLN Take 3 mLs by nebulization every 4 (four) hours as needed.  . IRON PO Take 1 tablet by mouth daily.  Marland Kitchen ketoconazole (NIZORAL) 2 % shampoo AS DIRECTED  . levothyroxine (SYNTHROID, LEVOTHROID)  75 MCG tablet Take 75 mcg by mouth daily before breakfast.  . metFORMIN (GLUCOPHAGE) 1000 MG tablet Take 1,000 mg by mouth daily with breakfast.  . OXYGEN 2.5 lpm with sleep only [facility]  . pantoprazole (PROTONIX) 40 MG tablet Take 1 tablet (40 mg total) by mouth 2 (two) times daily before a meal.  . Turmeric 500 MG TABS Take 500 mg by mouth daily.  Objective:   Physical Exam  amb obese wm   08/26/2017       246  05/02/2017       248  04/01/2017      244   02/12/17 248 lb (112.5 kg)  02/11/17 239 lb (108.4 kg)  01/02/17 245 lb (111.1 kg)    Vital signs reviewed - Note on arrival 02 sats  93% on RA        HEENT: nl dentition / oropharynx. Nl external ear canals without cough reflex - mild  bilateral non-specific turbinate edema     NECK :  without JVD/Nodes/TM/ nl carotid upstrokes bilaterally   LUNGS: no acc muscle use,  Mildd barrel  contour chest wall with bilateral  Distant bs s audible wheeze and  without cough on insp or exp maneuver and mild   Hyperresonant  to  percussion bilaterally     CV:  RRR  no s3 or murmur or increase in P2, and no edema   ABD:  Obese/soft and nontender with pos late insp Hoover's  in the supine position. No bruits or organomegaly appreciated, bowel sounds nl  MS:   Nl gait/  ext warm without deformities, calf tenderness, cyanosis or clubbing No obvious joint restrictions   SKIN: warm and dry without lesions    NEURO:  alert, approp, nl sensorium with  no motor or cerebellar deficits apparent.                Assessment:

## 2018-03-17 NOTE — Assessment & Plan Note (Signed)
-    HCO3  03/02/17 = 37  -  04/01/2017 : Patient Saturations on Room Air at Rest = 96%  And  while Ambulating = 88% Patient Saturations on 2 Liters of oxygen while Ambulating = 94%  As of 03/17/2018  rec 2lpm hs only  As did not need 02 during rehab monitoring on RA per his report and no longer using in prn exertion at home

## 2018-03-17 NOTE — Patient Instructions (Addendum)
No change in recommendations  Weight control is simply a matter of calorie balance which needs to be tilted in your favor by eating less and exercising more.  To get the most out of exercise, you need to be continuously aware that you are short of breath, but never out of breath, for 30 minutes daily. As you improve, it will actually be easier for you to do the same amount of exercise  in  30 minutes so always push to the level where you are short of breath.  If this does not result in gradual weight reduction then I strongly recommend you see a nutritionist with a food diary x 2 weeks so that we can work out a negative calorie balance which is universally effective in steady weight loss programs.  Think of your calorie balance like you do your bank account where in this case you want the balance to go down so you must take in less calories than you burn up.  It's just that simple:  Hard to do, but easy to understand.  Good luck!    Please schedule a follow up visit in  6  months but call sooner if needed with pfts on return

## 2018-03-24 NOTE — Progress Notes (Signed)
[facility] Report   Patient Details  Name: Jeremiah [name] MRN: [id number] Date of Birth: March 01, 1946 Age: 72 y.o. PCP: [name], MD  Vitals:   03/24/18 1330  BP: 132/72  Pulse: 77  Resp: 20  SpO2: 93%  Weight: 248 lb 12.8 oz (112.9 kg)  Height: 5\' 7"  (1.702 m)     [facility] Eval - 03/24/18 1300      Referral    Referring Provider  Pulm rehab    Reason for referral  Diabetes;Family History;High Cholesterol;Inactivity;Obesitity/Overweight;Orthopedic      Measurement   Neck measurement  20.4 Inches    Waist Circumference  50 inches    Body fat  38.4 percent      Information for Trainer   Goals  "25lb weight loss, build up stamina"    Current Exercise  "minimal. was in pulmonary rehab"    Orthopedic Concerns  RT ankle-plates/screws, pain    Pertinent Medical History  "diabetic, heart"    Current Barriers  RT ankle    Restrictions/Precautions  Diabetic snack before exercise      Mobility and Daily Activities   I find it easy to walk up or down two or more flights of stairs.  1    I have no trouble taking out the trash.  4    I do housework such as vacuuming and dusting on my own without difficulty.  4    I can easily lift a gallon of milk (8lbs).  4    I can easily walk a mile.  1    I have no trouble reaching into high cupboards or reaching down to pick up something from the floor.  4    I do not have trouble doing out-door work such as Armed forces logistics/support/administrative officer, raking leaves, or gardening.  3      Mobility and Daily Activities   I feel younger than my age.  4    I feel independent.  4    I feel energetic.  4    I live an active life.   4    I feel strong.  2    I feel healthy.  3    I feel active as other people my age.  4      How fit and strong are you.   Fit and Strong Total Score  46      Past Medical History:  Diagnosis Date  . A-fib ([place])   . CAD (coronary artery disease)    a. 2006: CABG in 2006 with LIMA-LAD, SVG-OM1, and SVG-RPDA  .  Chronic pain 03/21/2016  . Essential hypertension 03/21/2016  . History of prostate cancer 03/21/2016  . HLD (hyperlipidemia)   . HTN (hypertension)   . Hx of CABG 2006  . Hypercholesteremia 03/21/2016  . Hypothyroidism   . Medication management 03/21/2016  . Morbid obesity ([employer]) 03/21/2016  . Myocardial infarct ([place])   . OSA (obstructive sleep apnea) 03/21/2016  . Pain in right ankle and joints of right foot 03/21/2016  . Paronychia 03/21/2016  . Primary insomnia 03/21/2016  . Prostate cancer ([place]) 2008  . Tobacco dependence 03/21/2016   Past Surgical History:  Procedure Laterality Date  . ANKLE SURGERY  12/2013  . CARDIAC CATHETERIZATION N/A 05/08/2016   Procedure: Left Heart Cath and Cors/Grafts Angiography;  Surgeon: Belva Crome, MD;  Location: [facility];  Service: Cardiovascular;  Laterality: N/A;  . CORONARY ARTERY BYPASS GRAFT  2006  x3  . ELECTROPHYSIOLOGIC STUDY N/A 08/24/2016   Procedure: SVT Ablation;  Surgeon: Will Meredith Leeds, MD;  Location: [facility];  Service: Cardiovascular;  Laterality: N/A;  . PROSTATE SURGERY  2008  . PTCA     Social History   Tobacco Use  Smoking Status Former Smoker  . Packs/day: 0.50  . Years: 60.00  . Pack years: 30.00  . Types: Cigarettes  . Last attempt to quit: 12/15/2016  . Years since quitting: 1.2  Smokeless Tobacco Never Used   Mr. Gramm has registered for the PREP on 03/20/18 with his wife and will begin his twice weekly classes from 6-7pm tonight.    Vanita Ingles 03/24/2018, 1:34 PM

## 2018-03-31 NOTE — Progress Notes (Signed)
[place] Report   Patient Details  Name: Jeremiah [name] MRN: [id number] Date of Birth: 1946-02-06 Age: 72 y.o. PCP: [name], MD  Vitals:   03/31/18 1310  Weight: 250 lb (113.4 kg)     [facility] Eval - 03/24/18 1300      Referral    Referring Provider  Pulm rehab    Reason for referral  Diabetes;Family History;High Cholesterol;Inactivity;Obesitity/Overweight;Orthopedic      Measurement   Neck measurement  20.4 Inches    Waist Circumference  50 inches    Body fat  38.4 percent      Information for Trainer   Goals  "25lb weight loss, build up stamina"    Current Exercise  "minimal. was in pulmonary rehab"    Orthopedic Concerns  RT ankle-plates/screws, pain    Pertinent Medical History  "diabetic, heart"    Current Barriers  RT ankle    Restrictions/Precautions  Diabetic snack before exercise      Mobility and Daily Activities   I find it easy to walk up or down two or more flights of stairs.  1    I have no trouble taking out the trash.  4    I do housework such as vacuuming and dusting on my own without difficulty.  4    I can easily lift a gallon of milk (8lbs).  4    I can easily walk a mile.  1    I have no trouble reaching into high cupboards or reaching down to pick up something from the floor.  4    I do not have trouble doing out-door work such as Armed forces logistics/support/administrative officer, raking leaves, or gardening.  3      Mobility and Daily Activities   I feel younger than my age.  4    I feel independent.  4    I feel energetic.  4    I live an active life.   4    I feel strong.  2    I feel healthy.  3    I feel active as other people my age.  4      How fit and strong are you.   Fit and Strong Total Score  46      Past Medical History:  Diagnosis Date  . A-fib ([place])   . CAD (coronary artery disease)    a. 2006: CABG in 2006 with LIMA-LAD, SVG-OM1, and SVG-RPDA  . Chronic pain 03/21/2016  . Essential hypertension 03/21/2016  . History of prostate  cancer 03/21/2016  . HLD (hyperlipidemia)   . HTN (hypertension)   . Hx of CABG 2006  . Hypercholesteremia 03/21/2016  . Hypothyroidism   . Medication management 03/21/2016  . Morbid obesity ([place]) 03/21/2016  . Myocardial infarct ([place])   . OSA (obstructive sleep apnea) 03/21/2016  . Pain in right ankle and joints of right foot 03/21/2016  . Paronychia 03/21/2016  . Primary insomnia 03/21/2016  . Prostate cancer ([place]) 2008  . Tobacco dependence 03/21/2016   Past Surgical History:  Procedure Laterality Date  . ANKLE SURGERY  12/2013  . CARDIAC CATHETERIZATION N/A 05/08/2016   Procedure: Left Heart Cath and Cors/Grafts Angiography;  Surgeon: Belva Crome, MD;  Location: [place] [facility];  Service: Cardiovascular;  Laterality: N/A;  . CORONARY ARTERY BYPASS GRAFT  2006   x3  . ELECTROPHYSIOLOGIC STUDY N/A 08/24/2016   Procedure: SVT Ablation;  Surgeon: Will Meredith Leeds, MD;  Location:  [facility];  Service: Cardiovascular;  Laterality: N/A;  . PROSTATE SURGERY  2008  . PTCA     Social History   Tobacco Use  Smoking Status Former Smoker  . Packs/day: 0.50  . Years: 60.00  . Pack years: 30.00  . Types: Cigarettes  . Last attempt to quit: 12/15/2016  . Years since quitting: 1.2  Smokeless Tobacco Never Used     Fun thing you did since last meeting:"Remodel Laundry room tile."   Vanita Ingles 03/31/2018, 1:11 PM

## 2018-04-01 DIAGNOSIS — I5033 Acute on chronic diastolic (congestive) heart failure: Secondary | ICD-10-CM | POA: Diagnosis not present

## 2018-04-07 NOTE — Progress Notes (Signed)
[facility] PREP Weekly Session   Patient Details  Name: Jeremiah [name] MRN: [id number] Date of Birth: 06/27/1946 Age: 72 y.o. PCP: [name], MD  Vitals:   03/31/18 1240  Weight: 251 lb (113.9 kg)    [facility] Weekly seesion - 04/07/18 1200      Weekly Session   Topic Discussed  Other ways to be active    Minutes exercised this week  40 minutes strength   strength   Classes attended to date  2        Vanita Ingles 04/07/2018, 12:43 PM

## 2018-04-08 ENCOUNTER — Other Ambulatory Visit: Payer: Self-pay | Admitting: Internal Medicine

## 2018-04-14 NOTE — Progress Notes (Signed)
[facility] PREP Weekly Session   Patient Details  Name: Brailon Don MRN: [id number] Date of Birth: 27-Oct-1946 Age: 72 y.o. PCP: [name], MD  Vitals:   04/07/18 1359  Weight: 249 lb 6.4 oz (113.1 kg)    [facility] Weekly seesion - 04/14/18 1300      Weekly Session   Topic Discussed  Healthy eating tips    Minutes exercised this week  45 minutes 5cardio/35strength/41flexibility   5cardio/35strength/63flexibility   Classes attended to date  [place] 04/14/2018, 2:00 PM

## 2018-04-16 NOTE — Progress Notes (Signed)
[facility] PREP Weekly Session   Patient Details  Name: Jeremiah [name] MRN: [id number] Date of Birth: 1946-09-08 Age: 72 y.o. PCP: [name], MD  Vitals:   04/14/18 1051  Weight: 249 lb (112.9 kg)    [facility] Weekly seesion - 04/16/18 1000      Weekly Session   Topic Discussed  Other ways to be active    Minutes exercised this week  125 minutes 25cardio/90strength/66flexibility   25cardio/90strength/40flexibility   Classes attended to date  [place] 04/16/2018, 10:52 AM

## 2018-04-22 ENCOUNTER — Other Ambulatory Visit: Payer: Self-pay | Admitting: Internal Medicine

## 2018-04-25 NOTE — Progress Notes (Signed)
[facility] PREP Weekly Session   Patient Details  Name: Jeremiah [name] MRN: [id number] Date of Birth: [birth year] Age: 72 y.o. PCP: [name], MD  Vitals:   04/21/18 1404  Weight: 248 lb (112.5 kg)    [facility] Weekly seesion - 04/25/18 1400      Weekly Session   Topic Discussed  Restaurant Eating    Minutes exercised this week  180 minutes strength   strength       Vanita Ingles 04/25/2018, 2:05 PM

## 2018-04-29 DIAGNOSIS — I10 Essential (primary) hypertension: Secondary | ICD-10-CM | POA: Diagnosis not present

## 2018-04-29 DIAGNOSIS — E1169 Type 2 diabetes mellitus with other specified complication: Secondary | ICD-10-CM | POA: Diagnosis not present

## 2018-04-29 DIAGNOSIS — E1165 Type 2 diabetes mellitus with hyperglycemia: Secondary | ICD-10-CM | POA: Diagnosis not present

## 2018-04-29 DIAGNOSIS — I5022 Chronic systolic (congestive) heart failure: Secondary | ICD-10-CM | POA: Diagnosis not present

## 2018-04-30 IMAGING — DX DG CHEST 2V
2 series · 2 of 2 positions shown · non-contrast
Comparison: 01/03/2017

CLINICAL DATA: Dyspnea

EXAM:
CHEST  2 VIEW

[w chest pa]
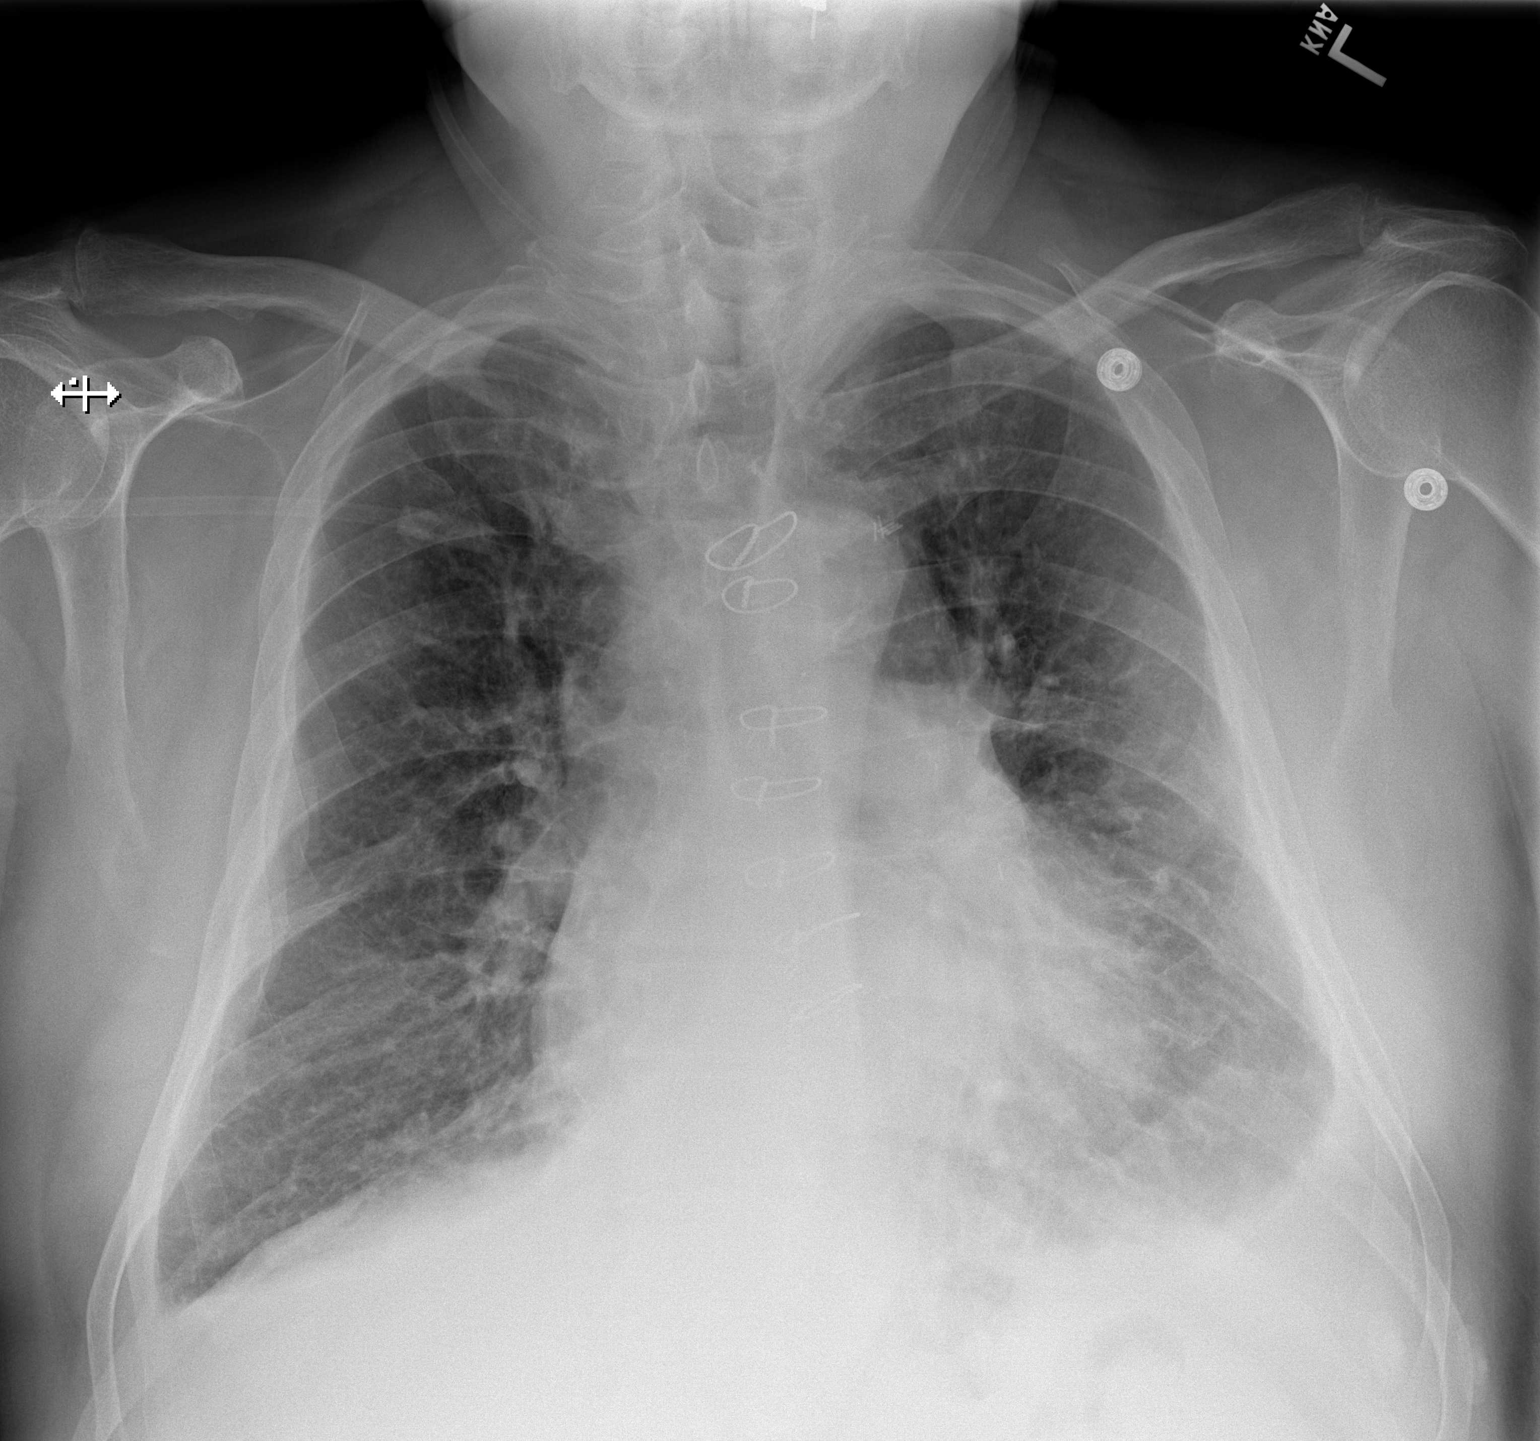

[w chest lat]
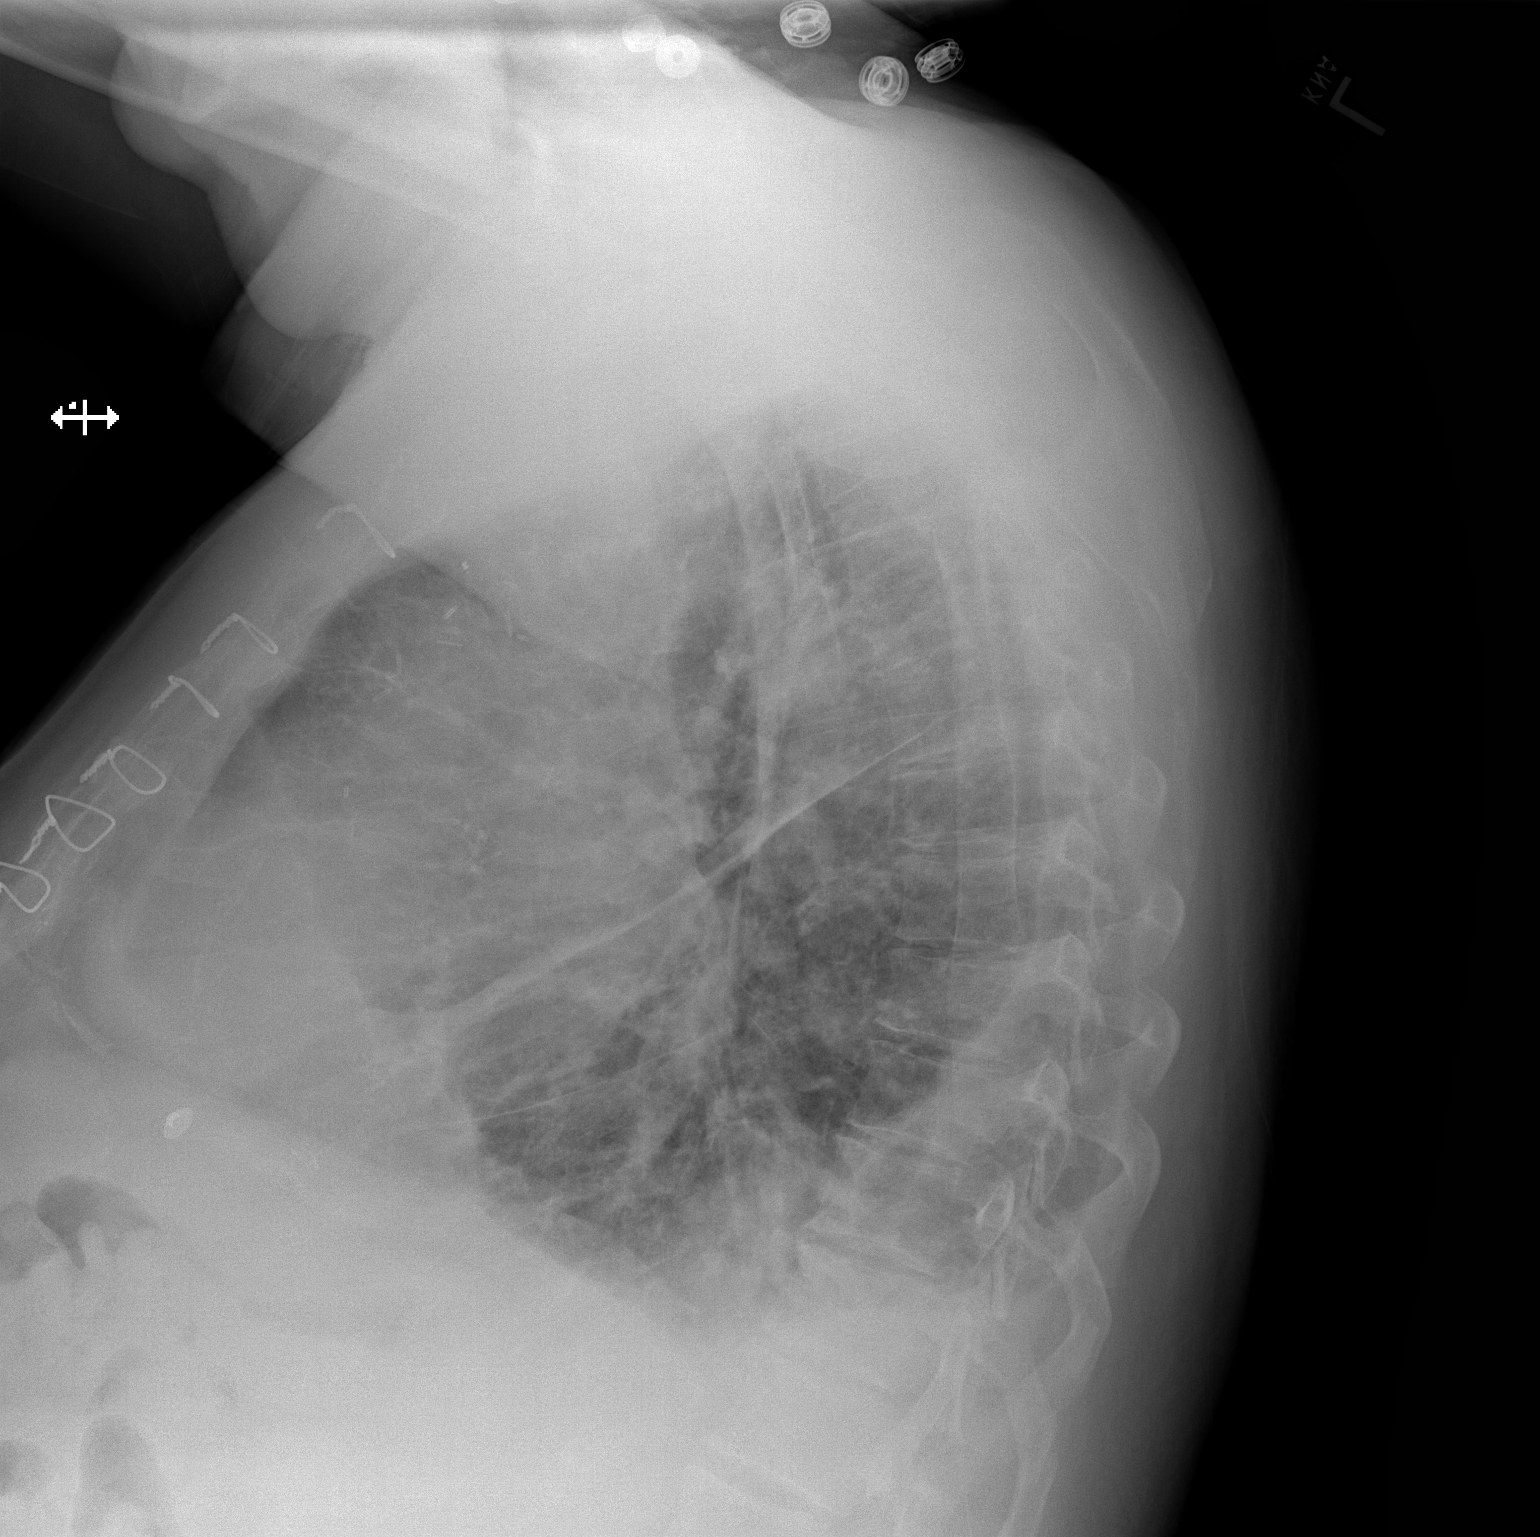

[2 of 2 positions shown; findings below may reference images not displayed]

FINDINGS: The patient is status post median sternotomy. Cardiac silhouette is
borderline enlarged with aortic atherosclerosis. No aneurysm. Mild
vascular congestion with small bilateral left greater than right
pleural effusions. No acute nor suspicious osseous abnormalities.
Degenerative changes are noted along the dorsal spine.
IMPRESSION: Status post CABG. Stable cardiomegaly with mild CHF and small left
greater than right pleural effusions.

## 2018-05-02 DIAGNOSIS — I5033 Acute on chronic diastolic (congestive) heart failure: Secondary | ICD-10-CM | POA: Diagnosis not present

## 2018-05-02 NOTE — Progress Notes (Signed)
[facility] PREP Weekly Session   Patient Details  Name: Deaundra Kutzer MRN: [id number] Date of Birth: 25-Jul-1946 Age: 72 y.o. PCP: [name], MD  Vitals:   04/28/18 0941  Weight: 247 lb (112 kg)    [facility] Weekly seesion - 05/02/18 0900      Weekly Session   Topic Discussed  Other    Minutes exercised this week  310 minutes 120cardio/180strength/33flexibility   120cardio/180strength/39flexibility       Vanita Ingles 05/02/2018, 9:42 AM

## 2018-05-12 NOTE — Progress Notes (Signed)
[facility] PREP Weekly Session   Patient Details  Name: Jeremiah [name] MRN: [id number] Date of Birth: 11/01/1946 Age: 72 y.o. PCP: [name], MD  Vitals:   05/05/18 1241  Weight: 245 lb (111.1 kg)    [facility] Weekly seesion - 05/12/18 1200      Weekly Session   Topic Discussed  Stress management and problem solving    Minutes exercised this week  -- "1cardio/2.5strength/9flexibility   "1cardio/2.5strength/93flexibility     Fun things you did since last meeting:"Watched Sam shovel dirt" Things you are grateful for:"Didn't have to have lunch at the dump" Nutrition celebrations:"Lobsters"  Jeremiah [name] 05/12/2018, 12:42 PM

## 2018-05-19 NOTE — Progress Notes (Signed)
[facility] PREP Weekly Session   Patient Details  Name: Jeremiah [name] MRN: [id number] Date of Birth: [birth year] Age: 71 y.o. PCP: [name], MD  Vitals:   05/12/18 1144  Weight: 247 lb (112 kg)    [facility] Weekly seesion - 05/19/18 1100      Weekly Session   Topic Discussed  Importance of resistance training    Minutes exercised this week  21 minutes " 7cardio/7strength/4flexibility"   " 7cardio/7strength/73flexibility"       Vanita Ingles 05/19/2018, 11:45 AM

## 2018-05-26 NOTE — Progress Notes (Signed)
[facility] PREP Weekly Session   Patient Details  Name: Jeremiah [name] MRN: [id number] Date of Birth: [birth year] Age: 72 y.o. PCP: [name], MD  Vitals:   05/19/18 1431  Weight: 247 lb (112 kg)    [facility] Weekly seesion - 05/26/18 1400      Weekly Session   Topic Discussed  Expectations and non-scale victories      Things you are grateful for:"Bands.  Dug a 3' hole. Tiled my kitchen and back splash"   Vanita Ingles 05/26/2018, 2:31 PM

## 2018-05-28 NOTE — Progress Notes (Signed)
[facility] PREP Weekly Session   Patient Details  Name: Okie Bogacz MRN: [id number] Date of Birth: 07-21-1946 Age: 72 y.o. PCP: [name], MD  Vitals:   05/26/18 1358  Weight: 246 lb (111.6 kg)    [facility] Weekly seesion - 05/28/18 1300      Weekly Session   Topic Discussed  Other Portion control   Portion control   Minutes exercised this week  -- "3hrs cardio/10hrs strength"   "3hrs cardio/10hrs strength"     Fun things you did since last meeting"Did a sidewalk" Things you are grateful for:"waking up"  Vanita Ingles 05/28/2018, 1:59 PM

## 2018-06-01 DIAGNOSIS — I5033 Acute on chronic diastolic (congestive) heart failure: Secondary | ICD-10-CM | POA: Diagnosis not present

## 2018-06-03 ENCOUNTER — Other Ambulatory Visit: Payer: Self-pay | Admitting: Internal Medicine

## 2018-07-01 DIAGNOSIS — K080 Exfoliation of teeth due to systemic causes: Secondary | ICD-10-CM | POA: Diagnosis not present

## 2018-07-02 DIAGNOSIS — I5033 Acute on chronic diastolic (congestive) heart failure: Secondary | ICD-10-CM | POA: Diagnosis not present

## 2018-08-02 DIAGNOSIS — I5033 Acute on chronic diastolic (congestive) heart failure: Secondary | ICD-10-CM | POA: Diagnosis not present

## 2018-08-05 DIAGNOSIS — L218 Other seborrheic dermatitis: Secondary | ICD-10-CM | POA: Diagnosis not present

## 2018-08-05 DIAGNOSIS — B078 Other viral warts: Secondary | ICD-10-CM | POA: Diagnosis not present

## 2018-08-05 DIAGNOSIS — L570 Actinic keratosis: Secondary | ICD-10-CM | POA: Diagnosis not present

## 2018-08-05 DIAGNOSIS — L718 Other rosacea: Secondary | ICD-10-CM | POA: Diagnosis not present

## 2018-08-05 DIAGNOSIS — X32XXXA Exposure to sunlight, initial encounter: Secondary | ICD-10-CM | POA: Diagnosis not present

## 2018-08-21 DIAGNOSIS — I5022 Chronic systolic (congestive) heart failure: Secondary | ICD-10-CM | POA: Diagnosis not present

## 2018-08-21 DIAGNOSIS — E1169 Type 2 diabetes mellitus with other specified complication: Secondary | ICD-10-CM | POA: Diagnosis not present

## 2018-08-21 DIAGNOSIS — E7800 Pure hypercholesterolemia, unspecified: Secondary | ICD-10-CM | POA: Diagnosis not present

## 2018-08-21 DIAGNOSIS — E039 Hypothyroidism, unspecified: Secondary | ICD-10-CM | POA: Diagnosis not present

## 2018-09-01 DIAGNOSIS — I5033 Acute on chronic diastolic (congestive) heart failure: Secondary | ICD-10-CM | POA: Diagnosis not present

## 2018-09-09 ENCOUNTER — Ambulatory Visit: Payer: Federal, State, Local not specified - PPO | Admitting: Internal Medicine

## 2018-09-09 ENCOUNTER — Encounter: Payer: Self-pay | Admitting: Internal Medicine

## 2018-09-09 VITALS — BP 132/76 | HR 71 | Ht 67.0 in | Wt 252.6 lb

## 2018-09-09 DIAGNOSIS — E785 Hyperlipidemia, unspecified: Secondary | ICD-10-CM

## 2018-09-09 DIAGNOSIS — I6529 Occlusion and stenosis of unspecified carotid artery: Secondary | ICD-10-CM | POA: Diagnosis not present

## 2018-09-09 DIAGNOSIS — G4733 Obstructive sleep apnea (adult) (pediatric): Secondary | ICD-10-CM

## 2018-09-09 DIAGNOSIS — I5032 Chronic diastolic (congestive) heart failure: Secondary | ICD-10-CM

## 2018-09-09 DIAGNOSIS — I10 Essential (primary) hypertension: Secondary | ICD-10-CM | POA: Diagnosis not present

## 2018-09-09 NOTE — Patient Instructions (Addendum)
Medication Instructions:  Dr Debara Pickett recommends that you continue on your current medications as directed. Please refer to the Current Medication list given to you today.  If you need a refill on your cardiac medications before your next appointment, please call your pharmacy.   Lab work: NONE ORDERED  If you have labs (blood work) drawn today and your tests are completely normal, you will receive your results only by: Marland Kitchen MyChart Message (if you have MyChart) OR . A paper copy in the mail If you have any lab test that is abnormal or we need to change your treatment, we will call you to review the results.  Testing/Procedures: 1. Carotid Duplex - Your physician has requested that you have a carotid duplex. This test is an ultrasound of the carotid arteries in your neck. It looks at blood flow through these arteries that supply the brain with blood. Allow one hour for this exam. There are no restrictions or special instructions.  Follow-Up: At [facility], you and your health needs are our priority.  As part of our continuing mission to provide you with exceptional heart care, we have created designated Provider Care Teams.  These Care Teams include your primary Cardiologist (physician) and Advanced Practice Providers (APPs -  Physician Assistants and Nurse Practitioners) who all work together to provide you with the care you need, when you need it. You will need a follow up appointment in 12 months.  Please call our office 2 months in advance to schedule this appointment.  You may see Pixie Casino, MD or one of the following Advanced Practice Providers on your designated Care Team: [place], [place] . Fabian Sharp, PA-C

## 2018-09-09 NOTE — Progress Notes (Signed)
OFFICE NOTE  Chief Complaint:  Routine follow-up  Primary Care Physician: [name], MD  HPI:  Jeremiah [name] is a 72 y.o. 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 He is originally from [place] and his wife who is accompanying him today is originally from [place]. Mr. Austad had an MI in 2006 and underwent coronary artery bypass grafting with a LIMA to LAD, SVG to OM1, and SVG to RPDA. He did fairly well with this however 2015 was having chest discomfort. He underwent a nuclear stress test which showed possible reversible ischemia. Therefore he was referred for cardiac catheterization at [facility] [place]. That demonstrated all 3 patent bypass grafts with multivessel coronary disease. At that time he was also having episodes of palpitations and a monitor demonstrated PSVT. He was scheduled for possible ablation but is had chronic problems with an ankle fracture and nonhealing wound. He therefore never underwent ablation. He was placed on digoxin and has been on metoprolol but stopped the digoxin at some point in the past. He reports over the past 8-12 months she's had no further palpitations. He also has a history of dyslipidemia, hypothyroidism, and borderline diabetes. Recently he had a sleep study through [facility] which demonstrated severe obstructive sleep apnea and BiPAP was recommended with settings of 19/14 cm water pressure. Currently he denies any chest pain or worsening shortness of breath. His last echocardiogram from his cardiologist in [place] was a 2015 which showed an EF of 58% and no significant valvular disease.  07/26/2016  Mr. Mckelvy returns today for follow-up. He was recently seen in the emergency department in the beginning of June for chest pain. This was associated with tachycardia and probable SVT. Troponin was noted to be elevated to 0.12. He had signs and symptoms concerning for unstable angina. He was  evaluated by my partners and felt to need cardiac catheterization. Eventually he underwent cardiac catheterization by Dr. Tamala Julian with the results as follows:  Conclusion   1. LM lesion, 85% stenosed. 2. Ost 1st Mrg lesion, 100% stenosed. 3. SVG was injected . 4. Ost RCA to Prox RCA lesion, 100% stenosed. 5. SVG . 6. Origin to Prox Graft lesion, 40% stenosed. 7. Mid RCA to Dist RCA lesion, 100% stenosed. 8. Prox LAD lesion, 100% stenosed. 9. LIMA .    Patent saphenous vein grafts. SVG to PDA contains eccentric 40% proximal stenosis. SVG to the circumflex is widely patent.  LIMA to the LAD is widely patent.  Native distal left main contains 75% stenosis. The dominant obtuse marginal branch is totally occluded. The native right coronary is totally occluded.  Left ventriculogram demonstrates inferior wall hypokinesis. EF 50%.  Recent chest pain in the setting of PSVT with minimal enzyme elevation likely related to demand ischemia in the setting of underlying native coronary disease.  Recommendations:  Medical therapy.  Management of PSVT with medications versus ablation.   Since discharge she denies any further episodes of tachycardia palpitations. He is already on a high dose of beta blocker. He's had a small amount of weight gain but has stopped smoking.  He plans to start to do more exercise and work on weight loss. I'm concerned about his recurrent palpitations and the fact that asymptomatic and has demand ischemia related to them and bypass graft insufficiency.  06/10/2017  Mr. Eller returns today for follow-up. It's been almost a year since I last saw him. He was found to have recurrent symptomatic  SVT and underwent comprehensive EP study and was diagnosed with classic AV nodal reentry tachycardia and subsequently underwent selective radiofrequency ablation by Dr. Curt Bears. He says since that time he's had no further episodes of tachypalpitations. Since then he was hospitalized  twice, after developing C. difficile colitis and enteritis with GI bleed in January. Subsequently he developed acute congestive heart failure was found to have a mild reduction in LVEF to 45-50% by echo in March 2018. At that time he was on furosemide 40 mg daily. He had a follow-up with Almyra Deforest, PA-C in April who noted he was on Lasix 40 mg twice daily, however he reported that he was not taking that to me today. Fact he says he is now off of Lasix. He reports worsening shortness of breath and hasn't fact had a 13 pound weight gain since April. He denies any lower extremity edema. He has been working with Dr. Melvyn Novas for his shortness of breath.  07/22/2017  Mr. Mcgovern is back today for follow-up. He restarted his Lasix at my direction his weight is now down from 252-244. He's had significant improvement in swelling and reports some improvement in his breathing however he says it still not good. He does have a mildly reduced EF of 45-50% but also has some chronic lung disease which is likely contributing. He says he gets short of breath while doing certain activities in the yard and then comes inside and uses his oxygen which improves him almost immediately. He denies any orthopnea. He has not had any recent or productive cough. He has a follow-up visit with his pulmonologist tomorrow. I had ordered lab work including a metabolic profile and BNP which was never obtained.  09/09/2018  Mr. Schar is seen today in follow-up.  He reports some improvement in his shortness of breath.  He has joined a gym and now is exercising every day.  Weight however has been stable at 252, actually up 2 pounds from April.  Despite this his breathing has improved somewhat.  He still uses oxygen.  He did have one episode recently where he became nauseated and vomited.  He was up at the [place] and had been taking antibiotics for a dental infection.  He did undergo CT scan of the head and neck and that demonstrated bilateral  carotid artery calcification.  This is not surprising given his history of coronary artery disease and prior CABG however he has not had prior carotid Dopplers.    PMHx:  Past Medical History:  Diagnosis Date  . A-fib ([place])   . CAD (coronary artery disease)    a. 2006: CABG in 2006 with LIMA-LAD, SVG-OM1, and SVG-RPDA  . Chronic pain 03/21/2016  . Essential hypertension 03/21/2016  . History of prostate cancer 03/21/2016  . HLD (hyperlipidemia)   . HTN (hypertension)   . Hx of CABG 2006  . Hypercholesteremia 03/21/2016  . Hypothyroidism   . Medication management 03/21/2016  . Morbid obesity ([place]) 03/21/2016  . Myocardial infarct ([place])   . OSA (obstructive sleep apnea) 03/21/2016  . Pain in right ankle and joints of right foot 03/21/2016  . Paronychia 03/21/2016  . Primary insomnia 03/21/2016  . Prostate cancer ([place]) 2008  . Tobacco dependence 03/21/2016    Past Surgical History:  Procedure Laterality Date  . ANKLE SURGERY  12/2013  . CARDIAC CATHETERIZATION N/A 05/08/2016   Procedure: Left Heart Cath and Cors/Grafts Angiography;  Surgeon: Belva Crome, MD;  Location: [place] [facility];  Service:  Cardiovascular;  Laterality: N/A;  . CORONARY ARTERY BYPASS GRAFT  2006   x3  . ELECTROPHYSIOLOGIC STUDY N/A 08/24/2016   Procedure: SVT Ablation;  Surgeon: Will Meredith Leeds, MD;  Location: [facility];  Service: Cardiovascular;  Laterality: N/A;  . PROSTATE SURGERY  2008  . PTCA      FAMHx:  Family History  Problem Relation Age of Onset  . Dementia Mother   . Diabetes Sister     SOCHx:   reports that he quit smoking about 20 months ago. His smoking use included cigarettes. He has a 30.00 pack-year smoking history. He has never used smokeless tobacco. He reports that he drinks alcohol. He reports that he does not use drugs.  ALLERGIES:  Allergies  Allergen Reactions  . Scallops [Shellfish Allergy] Other (See Comments)    Rash; shortness of breath  . Fluzone [Flu Virus  Vaccine] Nausea And Vomiting, Palpitations and Rash    ROS: Pertinent items noted in HPI and remainder of comprehensive ROS otherwise negative.  HOME MEDS: Current Outpatient Medications  Medication Sig Dispense Refill  . aspirin 81 MG tablet Take 1 tablet (81 mg total) by mouth daily. Hold for 5 days (Patient taking differently: Take 81 mg by mouth daily. ) 30 tablet   . atorvastatin (LIPITOR) 80 MG tablet Take 80 mg by mouth daily.    . betamethasone dipropionate 0.05 % lotion Apply topically daily. Apply to affected area daily    . bisoprolol (ZEBETA) 5 MG tablet TAKE 1 TABLET(5 MG) BY MOUTH DAILY 60 tablet 5  . furosemide (LASIX) 40 MG tablet TAKE 1 TABLET(40 MG) BY MOUTH DAILY 90 tablet 0  . glimepiride (AMARYL) 1 MG tablet Take 1 tablet by mouth daily.    Marland Kitchen ipratropium-albuterol (DUONEB) 0.5-2.5 (3) MG/3ML SOLN Take 3 mLs by nebulization every 4 (four) hours as needed. 360 mL 0  . IRON PO Take 1 tablet by mouth daily.    Marland Kitchen ketoconazole (NIZORAL) 2 % shampoo AS DIRECTED    . levothyroxine (SYNTHROID, LEVOTHROID) 75 MCG tablet Take 75 mcg by mouth daily before breakfast.    . metFORMIN (GLUCOPHAGE) 1000 MG tablet Take 1,000 mg by mouth daily with breakfast.    . OXYGEN 2.5 lpm with sleep only AHC    . pantoprazole (PROTONIX) 40 MG tablet TAKE 1 TABLET(40 MG) BY MOUTH TWICE DAILY BEFORE A MEAL 60 tablet 2  . Turmeric 500 MG TABS Take 500 mg by mouth daily.     No current facility-administered medications for this visit.     LABS/IMAGING: No results found for this or any previous visit (from the past 48 hour(s)). No results found.  WEIGHTS: Wt Readings from Last 3 Encounters:  09/09/18 252 lb 9.6 oz (114.6 kg)  05/26/18 246 lb (111.6 kg)  05/19/18 247 lb (112 kg)    VITALS: BP 132/76   Pulse 71   Ht 5\' 7"  (1.702 m)   Wt 252 lb 9.6 oz (114.6 kg)   BMI 39.56 kg/m   EXAM: General appearance: alert, no distress and moderately obese Neck: no carotid bruit and no  JVD Lungs: diminished breath sounds bilaterally and wheezes bilaterally Heart: regular rate and rhythm, S1, S2 normal, no murmur, click, rub or gallop Abdomen: soft, non-tender; bowel sounds normal; no masses,  no organomegaly Extremities: extremities normal, atraumatic, no cyanosis or edema Pulses: 2+ and symmetric Skin: Skin color, texture, turgor normal. No rashes or lesions Neurologic: Grossly normal Psych: Pleasant  EKG: Normal sinus rhythm  71, PACs, lateral T wave changes-personally reviewed  ASSESSMENT: 1. Recent SVT which was symptomatic-repeat cardiac catheterization (05/2016) shows patent grafts, status post ablation of AV nodal reentry tachycardia (08/2016) 2. CAD status post three-vessel CABG in 2006 (LIMA to LAD, SVG to OM1, SVG to RPDA) in [place] 3. Patent bypass grafts by cath in 2015 4. History of PSVT-on metoprolol 5. Dyslipidemia on Lipitor 6. OSA-BiPAP recommended 7. Acute on chronic systolic congestive heart failure-LVEF 45-50%  PLAN: 1.   Mr. Emond continues to use oxygen but has had some improvement in his shortness of breath, probably due to daily exercise.  I expect that his weight will continue to come down at some point.  He denies any recurrent SVT.  He denies any cardiac chest pain.  He does have bilateral carotid artery calcification.  I do not appreciate a bruit on exam however will order carotid Dopplers.  He has dyslipidemia on Lipitor and most recent LDL was 63 in February 2019.  Hemoglobin A1c is 8.5 which is above goal.  He should continue to work on diet and weight loss.  He is on metformin and Amaryl, but would also benefit from [place], specifically for cardiovascular risk reduction.  Follow-up annually or sooner as necessary.  Pixie Casino, MD, [facility], [place] Director of the Advanced Lipid Disorders &  Cardiovascular Risk Reduction Clinic Diplomate of the American Board of Clinical Lipidology Attending  Cardiologist  Direct Dial: [phone]  Fax: [phone]  Website:  [email or web address] [email or web address] [place] Paiz 09/09/2018, 8:47 AM

## 2018-09-10 ENCOUNTER — Ambulatory Visit (HOSPITAL_COMMUNITY)
Admission: RE | Admit: 2018-09-10 | Discharge: 2018-09-10 | Disposition: A | Discharge status: Home / Self Care | Payer: Federal, State, Local not specified - PPO | Source: Ambulatory Visit | Attending: Internal Medicine | Admitting: Internal Medicine

## 2018-09-10 DIAGNOSIS — I6529 Occlusion and stenosis of unspecified carotid artery: Secondary | ICD-10-CM | POA: Diagnosis not present

## 2018-09-16 ENCOUNTER — Ambulatory Visit: Payer: Federal, State, Local not specified - PPO | Admitting: Internal Medicine

## 2018-09-18 ENCOUNTER — Telehealth: Payer: Self-pay | Admitting: Internal Medicine

## 2018-09-18 NOTE — Telephone Encounter (Signed)
Pt aware of carotid duplex results with verbal understanding.  Notes recorded by Pixie Casino, MD on 09/14/2018 at 11:27 AM EDT Mild bilateral carotid artery disease

## 2018-09-18 NOTE — Telephone Encounter (Signed)
Follow Up:     Returning your call from today, concerning his test results.

## 2018-09-19 ENCOUNTER — Other Ambulatory Visit: Payer: Self-pay | Admitting: Internal Medicine

## 2018-10-02 DIAGNOSIS — I5033 Acute on chronic diastolic (congestive) heart failure: Secondary | ICD-10-CM | POA: Diagnosis not present

## 2018-10-07 DIAGNOSIS — E119 Type 2 diabetes mellitus without complications: Secondary | ICD-10-CM | POA: Diagnosis not present

## 2018-10-07 DIAGNOSIS — I10 Essential (primary) hypertension: Secondary | ICD-10-CM | POA: Diagnosis not present

## 2018-10-16 DIAGNOSIS — L0291 Cutaneous abscess, unspecified: Secondary | ICD-10-CM | POA: Diagnosis not present

## 2018-10-27 ENCOUNTER — Ambulatory Visit: Payer: Federal, State, Local not specified - PPO | Admitting: Internal Medicine

## 2018-10-28 ENCOUNTER — Encounter: Payer: Self-pay | Admitting: Internal Medicine

## 2018-10-28 ENCOUNTER — Ambulatory Visit: Payer: Federal, State, Local not specified - PPO | Admitting: Internal Medicine

## 2018-10-28 ENCOUNTER — Ambulatory Visit (INDEPENDENT_AMBULATORY_CARE_PROVIDER_SITE_OTHER): Payer: Federal, State, Local not specified - PPO | Admitting: Internal Medicine

## 2018-10-28 VITALS — BP 118/72 | HR 71 | Ht 67.0 in | Wt 251.0 lb

## 2018-10-28 DIAGNOSIS — I6529 Occlusion and stenosis of unspecified carotid artery: Secondary | ICD-10-CM

## 2018-10-28 DIAGNOSIS — J449 Chronic obstructive pulmonary disease, unspecified: Secondary | ICD-10-CM

## 2018-10-28 DIAGNOSIS — J9611 Chronic respiratory failure with hypoxia: Secondary | ICD-10-CM

## 2018-10-28 DIAGNOSIS — J9612 Chronic respiratory failure with hypercapnia: Secondary | ICD-10-CM | POA: Diagnosis not present

## 2018-10-28 LAB — PULMONARY FUNCTION TEST
DL/VA % PRED: 88 %
DL/VA: 3.91 ml/min/mmHg/L
DLCO UNC % PRED: 67 %
DLCO UNC: 19.22 ml/min/mmHg
FEF 25-75 POST: 0.6 L/s
FEF 25-75 PRE: 0.57 L/s
FEF2575-%Change-Post: 4 %
FEF2575-%PRED-POST: 27 %
FEF2575-%Pred-Pre: 26 %
FEV1-%CHANGE-POST: 7 %
FEV1-%Pred-Post: 43 %
FEV1-%Pred-Pre: 40 %
FEV1-Post: 1.25 L
FEV1-Pre: 1.16 L
FEV1FVC-%Change-Post: 1 %
FEV1FVC-%PRED-PRE: 79 %
FEV6-%CHANGE-POST: 9 %
FEV6-%PRED-POST: 55 %
FEV6-%Pred-Pre: 51 %
FEV6-Post: 2.04 L
FEV6-Pre: 1.87 L
FEV6FVC-%CHANGE-POST: 2 %
FEV6FVC-%Pred-Post: 102 %
FEV6FVC-%Pred-Pre: 99 %
FVC-%Change-Post: 6 %
FVC-%PRED-POST: 54 %
FVC-%Pred-Pre: 51 %
FVC-Post: 2.12 L
FVC-Pre: 2 L
POST FEV1/FVC RATIO: 59 %
Post FEV6/FVC ratio: 96 %
Pre FEV1/FVC ratio: 58 %
Pre FEV6/FVC Ratio: 94 %
RV % pred: 134 %
RV: 3.11 L
TLC % pred: 86 %
TLC: 5.58 L

## 2018-10-28 NOTE — Progress Notes (Signed)
PFT done today. 

## 2018-10-28 NOTE — Progress Notes (Signed)
Subjective:     Patient ID: Jeremiah [name], male   DOB: December 14, 1945,    MRN: [id number]     Brief patient profile:  6 yowm quit smoking 12/15/16 with onset of doe x 2008 and GOLD III criteria confirmed 10/28/2018 with restictive component due to obesity    Admit date: 01/02/2017 Discharge date: 01/05/2017  Primary Care Physician:  [name], MD  Discharge Diagnoses:    . Upper GI bleed . Enteritis due to Clostridium difficile . SVT (supraventricular tachycardia) ([place]) . Morbid obesity ([place]) . Essential hypertension . OSA (obstructive sleep apnea) . Symptomatic anemia . Hypothyroidism, acquired      History of Present Illness   02/12/2017 1st [facility] office visit/ Jeremiah [name]  Re ? Copd  Chief Complaint  Patient presents with  . Pulmonary Consult    Referred by Dr. Dorthy Cooler. Pt c/o SOB for the past 10 yrs. He gets SOB when running and working "which I don't do anymore".   MMRC2 = can't walk a nl pace on a flat grade s sob but does fine slow and flat eg shopping / limited also by R chronic ankle pain p fx rec Stop lopressor (metaprolol) and start bisoprolol 5 mg twice daily  If face/eye symptoms  worse or fever go back to ER  Please schedule a follow up office visit in 4 weeks, sooner if needed with pfts on return    04/01/2017  f/u ov/Jeremiah [name] re: copd gold III/ 02 2lpm  Chief Complaint  Patient presents with  . Follow-up    Breathing is doing well. No new co's today.    Goal is to walk dogs beyond the end of property line but can't presently due to sob   rec Protonix Take 30- 60 min before your first and last meals of the day  GERD diet  Please schedule a follow up office visit in 4 weeks, sooner if needed  with all medications /inhalers/ solutions in hand so we can verify exactly what you are taking. This includes all medications from all doctors and over the counters    05/02/2017  f/u ov/Jeremiah [name] re: COPD GOLD III / did not bring meds as requested but maint on duoneb  only  Chief Complaint  Patient presents with  . Follow-up    Pt states "I still sound like a frog and I still snore"- no new co's today. He is requesting order for POC for travel. He only uses o2 with sleep. He also requestes humidity to be added to his o2.   sleeps fine on 2lpm s am ha / not consistent with amb 02  Doe = MMRC2 = can't walk a nl pace on a flat grade s sob but does fine slow and flat on 02 and wants POC rec 02 2lpm at bedtime with humidity which we will arrange  Please see patient coordinator before you leave today  to schedule ambulatory 02 titration to see if eligible for POC    08/26/2017  f/u ov/Jeremiah [name] re:  Out of bevespi x one month -  no change in symptoms / 02 2lpm hs and prn  Chief Complaint  Patient presents with  . Follow-up    Breathing is doing well. He uses Duoneb 2 x per wk on average.  still doe x MMRC2 = can't walk a nl pace on a flat grade s sob but does fine slow and flat eg shopping with /without 02 and says sats vary, typically sits down once sob and then use 02  to catch his breath rather than with ex as rec  Uses 2lpm hs and sleeps fine flat  Only uses duoenb when over does it, not noct or at rest typically  rec 02 2lpm at bedtime and with sustained  Walking  - it will help you lose wt to keep your 02 above 90% while exerting  Please see patient coordinator before you leave today  to schedule pulmonary rehab     03/17/2018  f/u ov/Jeremiah [name] re:  GOLD III/ 02 2lpm hs and no inhalers/ not using nebs  Chief Complaint  Patient presents with  . Follow-up    Breathing has improved since last seen here. He is using his neb approx 1 x per month.    Dyspnea:  Better to point where out planting trees = MMRC1 = can walk nl pace, flat grade, can't hurry or go uphills or steps s sob   Cough: no Sleep: fine on 2lpm  SABA use:  Very rare rec No change in recommendations Weight control is simply a matter of calorie balance    10/28/2018  f/u ov/Jeremiah [name] re: GOLD III  copd / no maint rx  Chief Complaint  Patient presents with  . Follow-up    Breathing has improved. He rarely uses his albuterol neb and does not have an albuterol inhaler.   Dyspnea:  Not limited by breathing from desired activities s using of inhalers / more limited by ankles esp on R   Cough: min daytime only, non-productive  Sleeping: flat ok SABA use: none 02: 2lpm hs only      No obvious day to day or daytime variability or assoc excess/ purulent sputum or mucus plugs or hemoptysis or cp or chest tightness, subjective wheeze or overt sinus or hb symptoms.   Sleeping on side  without nocturnal  or early am exacerbation  of respiratory  c/o's or need for noct saba. Also denies any obvious fluctuation of symptoms with weather or environmental changes or other aggravating or alleviating factors except as outlined above   No unusual exposure hx or h/o childhood pna/ asthma or knowledge of premature birth.  Current Allergies, Complete Past Medical History, Past Surgical History, Family History, and Social History were reviewed in [employer] record.  ROS  The following are not active complaints unless bolded Hoarseness, sore throat, dysphagia, dental problems, itching, sneezing,  nasal congestion or discharge of excess mucus or purulent secretions, ear ache,   fever, chills, sweats, unintended wt loss or wt gain, classically pleuritic or exertional cp,  orthopnea pnd or arm/hand swelling  or leg swelling, presyncope, palpitations, abdominal pain, anorexia, nausea, vomiting, diarrhea  or change in bowel habits or change in bladder habits, change in stools or change in urine, dysuria, hematuria,  rash, arthralgias, visual complaints, headache, numbness, weakness or ataxia or problems with walking or coordination,  change in mood or  memory.        Current Meds  Medication Sig  . aspirin 81 MG tablet Take 1 tablet (81 mg total) by mouth daily. Hold for 5 days (Patient  taking differently: Take 81 mg by mouth daily. )  . atorvastatin (LIPITOR) 80 MG tablet Take 80 mg by mouth daily.  . betamethasone dipropionate 0.05 % lotion Apply topically daily. Apply to affected area daily  . bisoprolol (ZEBETA) 5 MG tablet TAKE 1 TABLET(5 MG) BY MOUTH DAILY  . furosemide (LASIX) 40 MG tablet TAKE 1 TABLET(40 MG) BY MOUTH DAILY  . glimepiride (AMARYL) 1  MG tablet Take 1 tablet by mouth daily.  Marland Kitchen ipratropium-albuterol (DUONEB) 0.5-2.5 (3) MG/3ML SOLN Take 3 mLs by nebulization every 4 (four) hours as needed.  . IRON PO Take 1 tablet by mouth daily.  Marland Kitchen ketoconazole (NIZORAL) 2 % shampoo AS DIRECTED  . levothyroxine (SYNTHROID, LEVOTHROID) 75 MCG tablet Take 75 mcg by mouth daily before breakfast.  . metFORMIN (GLUCOPHAGE) 1000 MG tablet Take 1,000 mg by mouth daily with breakfast.  . OXYGEN 2.5 lpm with sleep only AHC  . pantoprazole (PROTONIX) 40 MG tablet TAKE 1 TABLET(40 MG) BY MOUTH TWICE DAILY BEFORE A MEAL  . Turmeric 500 MG TABS Take 500 mg by mouth daily.            Objective:   Physical Exam    10/28/2018     252  08/26/2017       246  05/02/2017       248  04/01/2017      244   02/12/17 248 lb (112.5 kg)  02/11/17 239 lb (108.4 kg)  01/02/17 245 lb (111.1 kg)    Vital signs reviewed - Note on arrival 02 sats  95% on RA      HEENT: nl dentition / oropharynx. Nl external ear canals without cough reflex -  Mild bilateral non-specific turbinate edema     NECK :  without JVD/Nodes/TM/ nl carotid upstrokes bilaterally   LUNGS: no acc muscle use,  Mild/mod barrel  contour chest wall with bilateral  Distant bs s audible wheeze and  without cough on insp or exp maneuver and mild/mod  Hyperresonant  to  percussion bilaterally     CV:  RRR  no s3 or murmur or increase in P2, and no edema   ABD: obese/ soft and nontender with pos late  insp Hoover's  in the supine position. No bruits or organomegaly appreciated, bowel sounds nl  MS:   Nl gait/  ext warm  without deformities, calf tenderness, cyanosis or clubbing No obvious joint restrictions   SKIN: warm and dry without lesions    NEURO:  alert, approp, nl sensorium with  no motor or cerebellar deficits apparent.          Labs ordered 10/28/2018   Alpha one AT screen       Assessment:

## 2018-10-28 NOTE — Patient Instructions (Signed)
Weight control is simply a matter of calorie balance which needs to be tilted in your favor by eating less and exercising more.  To get the most out of exercise, you need to be continuously aware that you are short of breath, but never out of breath, for 30 minutes daily. As you improve, it will actually be easier for you to do the same amount of exercise  in  30 minutes so always push to the level where you are short of breath.  If this does not result in gradual weight reduction then I strongly recommend you see a nutritionist with a food diary x 2 weeks so that we can work out a negative calorie balance which is universally effective in steady weight loss programs.  Think of your calorie balance like you do your bank account where in this case you want the balance to go down so you must take in less calories than you burn up.  It's just that simple:  Hard to do, but easy to understand.  Good luck!   Please remember to go to the lab department    for your tests - we will call you with the results when they are available.    If you are satisfied with your treatment plan,  let your doctor know and he/she can either refill your medications or you can return here when your prescription runs out.     If in any way you are not 100% satisfied,  please tell us.  If 100% better, tell your friends!  Pulmonary follow up is as needed

## 2018-10-29 ENCOUNTER — Encounter: Payer: Self-pay | Admitting: Internal Medicine

## 2018-10-29 NOTE — Assessment & Plan Note (Addendum)
-   trial of selective BB 02/12/2017  - PFT's  04/01/2017  FEV1 1.29 ( 44% ) ratio 55  p no % improvement from saba p ?  prior to study with DLCO  65/67 % corrects to 85  % for alv volume  - 05/02/2017  After extensive coaching HFA effectiveness =    75% > try bevespi samples x 2 weeks  > no objective change so did not continue  - Referred to rehab 08/26/2017 > improved    - PFT's  10/28/2018  FEV1 1.25 (43 % ) ratio 59  p 7 % improvement from saba p nothing prior to study with DLCO  67 % corrects to 88 % for alv volume  With mild curvature/ insp truncation s plateau  - alpha one screen 10/28/2018   I had an extended final summary discussion with the patient reviewing all relevant studies completed to date and  lasting 15 to 20 minutes of a 25 minute visit on the following issues:   I reviewed the Fletcher curve with the patient that basically indicates  if you quit smoking when your best day FEV1 is still  preserved (which is relatively still the case here)  it is highly unlikely you will progress to severe disease and informed the patient there was  no medication on the market that has proven to alter the curve/ its downward trajectory  or the likelihood of progression of their disease(unlike other chronic medical conditions such as atheroclerosis where we do think we can change the natural hx with risk reducing meds)    Therefore stopping smoking and maintaining abstinence are  the most important aspects of his care, not choice of inhalers or for that matter, doctors.   Treatment other than smoking cessation  is entirely directed by severity of symptoms and focused also on reducing exacerbations, not attempting to change the natural history of the disease.   Since he denies symptoms (more limited by ankles from activities) and not having any aecopd, he does not need any maint rx and f/u can be prn

## 2018-10-29 NOTE — Assessment & Plan Note (Signed)
-    HCO3  03/02/17 = 37  -  04/01/2017 : Patient Saturations on Room Air at Rest = 96%  And  while Ambulating = 88% Patient Saturations on 2 Liters of oxygen while Ambulating = 94%  As of 10/28/2018  rec 2lpm hs only    He did qualify for amb 02 but as per the new guidelines, not needed here and can just use the 02 at hs and prn daytime

## 2018-10-29 NOTE — Assessment & Plan Note (Signed)
ERV 20% by PFT's 10/28/2018   Body mass index is 39.31 kg/m.  -  trending up Lab Results  Component Value Date   TSH 2.448 02/26/2017     Contributing to gerd risk/ doe/reviewed the need and the process to achieve and maintain neg calorie balance > defer f/u primary care including intermittently monitoring thyroid status

## 2018-11-01 DIAGNOSIS — I5033 Acute on chronic diastolic (congestive) heart failure: Secondary | ICD-10-CM | POA: Diagnosis not present

## 2018-11-03 LAB — ALPHA-1 ANTITRYPSIN PHENOTYPE: A-1 Antitrypsin, Ser: 144 mg/dL (ref 83–199)

## 2018-11-05 NOTE — Progress Notes (Signed)
Spoke with pt and notified of results per Dr. Wert. Pt verbalized understanding and denied any questions. 

## 2018-11-20 DIAGNOSIS — I10 Essential (primary) hypertension: Secondary | ICD-10-CM | POA: Diagnosis not present

## 2018-11-20 DIAGNOSIS — E119 Type 2 diabetes mellitus without complications: Secondary | ICD-10-CM | POA: Diagnosis not present

## 2018-11-21 DIAGNOSIS — L03211 Cellulitis of face: Secondary | ICD-10-CM | POA: Diagnosis not present

## 2018-11-28 DIAGNOSIS — M25571 Pain in right ankle and joints of right foot: Secondary | ICD-10-CM | POA: Diagnosis not present

## 2018-11-28 DIAGNOSIS — M19071 Primary osteoarthritis, right ankle and foot: Secondary | ICD-10-CM | POA: Diagnosis not present

## 2018-12-02 ENCOUNTER — Ambulatory Visit (INDEPENDENT_AMBULATORY_CARE_PROVIDER_SITE_OTHER): Payer: Federal, State, Local not specified - PPO

## 2018-12-02 ENCOUNTER — Ambulatory Visit (INDEPENDENT_AMBULATORY_CARE_PROVIDER_SITE_OTHER): Payer: Federal, State, Local not specified - PPO | Admitting: Podiatry

## 2018-12-02 ENCOUNTER — Encounter: Payer: Self-pay | Admitting: Podiatry

## 2018-12-02 VITALS — BP 109/56 | HR 90 | Resp 16

## 2018-12-02 DIAGNOSIS — M7752 Other enthesopathy of left foot: Secondary | ICD-10-CM | POA: Diagnosis not present

## 2018-12-02 DIAGNOSIS — I6529 Occlusion and stenosis of unspecified carotid artery: Secondary | ICD-10-CM

## 2018-12-02 DIAGNOSIS — M7751 Other enthesopathy of right foot: Secondary | ICD-10-CM | POA: Diagnosis not present

## 2018-12-02 DIAGNOSIS — M7750 Other enthesopathy of unspecified foot: Secondary | ICD-10-CM

## 2018-12-02 DIAGNOSIS — I5033 Acute on chronic diastolic (congestive) heart failure: Secondary | ICD-10-CM | POA: Diagnosis not present

## 2018-12-02 NOTE — Progress Notes (Signed)
Subjective:  Patient ID: Jeremiah [name], male    DOB: 1946-03-11,  MRN: [id number] HPI Chief Complaint  Patient presents with  . Ankle Pain    Ankle right (medial, anterior, lateral) - aching x 1 week, feels weak, when walking feels like ankle wants to "give way", previous ankle surgery in 2015, went to [facility]-recommended ice, elevating, exercise, boot  . New Patient (Initial Visit)    72 y.o. male presents with the above complaint.   ROS: Denies fever chills nausea vomiting muscle aches pains calf pain back pain chest pain chest pain shortness of breath headache.  Past Medical History:  Diagnosis Date  . A-fib ([place])   . CAD (coronary artery disease)    a. 2006: CABG in 2006 with LIMA-LAD, SVG-OM1, and SVG-RPDA  . Chronic pain 03/21/2016  . Essential hypertension 03/21/2016  . History of prostate cancer 03/21/2016  . HLD (hyperlipidemia)   . HTN (hypertension)   . Hx of CABG 2006  . Hypercholesteremia 03/21/2016  . Hypothyroidism   . Medication management 03/21/2016  . Morbid obesity ([place]) 03/21/2016  . Myocardial infarct ([place])   . OSA (obstructive sleep apnea) 03/21/2016  . Pain in right ankle and joints of right foot 03/21/2016  . Paronychia 03/21/2016  . Primary insomnia 03/21/2016  . Prostate cancer ([place]) 2008  . Tobacco dependence 03/21/2016   Past Surgical History:  Procedure Laterality Date  . ANKLE SURGERY  12/2013  . CARDIAC CATHETERIZATION N/A 05/08/2016   Procedure: Left Heart Cath and Cors/Grafts Angiography;  Surgeon: Belva Crome, MD;  Location: [facility];  Service: Cardiovascular;  Laterality: N/A;  . CORONARY ARTERY BYPASS GRAFT  2006   x3  . ELECTROPHYSIOLOGIC STUDY N/A 08/24/2016   Procedure: SVT Ablation;  Surgeon: Will Meredith Leeds, MD;  Location: [facility];  Service: Cardiovascular;  Laterality: N/A;  . PROSTATE SURGERY  2008  . PTCA      Current Outpatient Medications:  .  aspirin 81 MG tablet, Take 1 tablet (81 mg total) by mouth daily.  Hold for 5 days (Patient taking differently: Take 81 mg by mouth daily. ), Disp: 30 tablet, Rfl:  .  atorvastatin (LIPITOR) 80 MG tablet, Take 80 mg by mouth daily., Disp: , Rfl:  .  betamethasone dipropionate 0.05 % lotion, Apply topically daily. Apply to affected area daily, Disp: , Rfl:  .  bisoprolol (ZEBETA) 5 MG tablet, TAKE 1 TABLET(5 MG) BY MOUTH DAILY, Disp: 60 tablet, Rfl: 5 .  furosemide (LASIX) 40 MG tablet, TAKE 1 TABLET(40 MG) BY MOUTH DAILY, Disp: 90 tablet, Rfl: 0 .  glimepiride (AMARYL) 1 MG tablet, Take 1 tablet by mouth daily., Disp: , Rfl:  .  ipratropium-albuterol (DUONEB) 0.5-2.5 (3) MG/3ML SOLN, Take 3 mLs by nebulization every 4 (four) hours as needed., Disp: 360 mL, Rfl: 0 .  IRON PO, Take 1 tablet by mouth daily., Disp: , Rfl:  .  levothyroxine (SYNTHROID, LEVOTHROID) 75 MCG tablet, Take 75 mcg by mouth daily before breakfast., Disp: , Rfl:  .  metFORMIN (GLUCOPHAGE) 1000 MG tablet, Take 1,000 mg by mouth daily with breakfast., Disp: , Rfl:  .  OXYGEN, 2.5 lpm with sleep only A[facility], Disp: , Rfl:  .  pantoprazole (PROTONIX) 40 MG tablet, TAKE 1 TABLET(40 MG) BY MOUTH TWICE DAILY BEFORE A MEAL, Disp: 60 tablet, Rfl: 2 .  Turmeric 500 MG TABS, Take 500 mg by mouth daily., Disp: , Rfl:   Allergies  Allergen Reactions  . Scallops [  Shellfish Allergy] Other (See Comments)    Rash; shortness of breath  . Fluzone [Flu Virus Vaccine] Nausea And Vomiting, Palpitations and Rash   Review of Systems Objective:   Vitals:   12/02/18 1023  BP: (!) 109/56  Pulse: 90  Resp: 16    General: Well developed, nourished, in no acute distress, alert and oriented x3   Dermatological: Skin is warm, dry and supple bilateral. Nails x 10 are well maintained; remaining integument appears unremarkable at this time. There are no open sores, no preulcerative lesions, no rash or signs of infection present.  Vascular: Dorsalis Pedis artery and Posterior Tibial artery pedal pulses are 2/4  bilateral with immedate capillary fill time. Pedal hair growth present. No varicosities and no lower extremity edema present bilateral.   Neruologic: Grossly intact via light touch bilateral. Vibratory intact via tuning fork bilateral. Protective threshold with Semmes Wienstein monofilament intact to all pedal sites bilateral. Patellar and Achilles deep tendon reflexes 2+ bilateral. No Babinski or clonus noted bilateral.   Musculoskeletal: No gross boney pedal deformities bilateral. No pain, crepitus, or limitation noted with foot and ankle range of motion bilateral. Muscular strength 5/5 in all groups tested bilateral.  Pain on palpation and end range of motion of the subtalar joint left and of the ankle joint.  Gait: Unassisted, Nonantalgic.    Radiographs:  Radiographs taken today demonstrate screws to the medial malleolus with osteoarthritic changes of the ankle mortise itself.  Remainder the foot appears to be relatively normal.  Sinus tarsi and subtalar joint appear to be somewhat arthritic.  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  Right foot right ankle.   Plan: Discussed etiology pathology conservative surgical therapies at this point injected his sinus tarsi today with 20 mg Kenalog 5 mg Marcaine point maximal tenderness.  Right foot.  He was also scanned for set of orthotics today.      T. [place], [place]

## 2018-12-03 DIAGNOSIS — I3481 Nonrheumatic mitral (valve) annulus calcification: Secondary | ICD-10-CM

## 2018-12-03 DIAGNOSIS — I358 Other nonrheumatic aortic valve disorders: Secondary | ICD-10-CM

## 2018-12-03 DIAGNOSIS — I272 Pulmonary hypertension, unspecified: Secondary | ICD-10-CM

## 2018-12-03 DIAGNOSIS — I071 Rheumatic tricuspid insufficiency: Secondary | ICD-10-CM

## 2018-12-03 DIAGNOSIS — I059 Rheumatic mitral valve disease, unspecified: Secondary | ICD-10-CM

## 2018-12-03 DIAGNOSIS — I517 Cardiomegaly: Secondary | ICD-10-CM

## 2018-12-03 HISTORY — DX: Pulmonary hypertension, unspecified: I27.20

## 2018-12-03 HISTORY — DX: Cardiomegaly: I51.7

## 2018-12-03 HISTORY — DX: Rheumatic tricuspid insufficiency: I07.1

## 2018-12-03 HISTORY — DX: Other nonrheumatic aortic valve disorders: I35.8

## 2018-12-03 HISTORY — DX: Rheumatic mitral valve disease, unspecified: I05.9

## 2018-12-03 HISTORY — DX: Nonrheumatic mitral (valve) annulus calcification: I34.81

## 2018-12-05 ENCOUNTER — Ambulatory Visit: Payer: Medicare Other | Admitting: Podiatry

## 2018-12-19 ENCOUNTER — Other Ambulatory Visit: Payer: Self-pay | Admitting: Internal Medicine

## 2018-12-25 ENCOUNTER — Other Ambulatory Visit: Payer: Self-pay | Admitting: Family Medicine

## 2018-12-25 ENCOUNTER — Ambulatory Visit
Admission: RE | Admit: 2018-12-25 | Discharge: 2018-12-25 | Disposition: A | Discharge status: Home / Self Care | Source: Ambulatory Visit | Attending: Family Medicine | Admitting: Family Medicine

## 2018-12-25 ENCOUNTER — Observation Stay (HOSPITAL_COMMUNITY)
Admission: EM | Admit: 2018-12-25 | Discharge: 2018-12-26 | DRG: 291 | Disposition: A | Discharge status: Home / Self Care | Payer: Federal, State, Local not specified - PPO | Attending: Family Medicine | Admitting: Family Medicine

## 2018-12-25 ENCOUNTER — Encounter (HOSPITAL_COMMUNITY): Payer: Self-pay

## 2018-12-25 DIAGNOSIS — G4733 Obstructive sleep apnea (adult) (pediatric): Secondary | ICD-10-CM | POA: Diagnosis present

## 2018-12-25 DIAGNOSIS — I252 Old myocardial infarction: Secondary | ICD-10-CM

## 2018-12-25 DIAGNOSIS — E7800 Pure hypercholesterolemia, unspecified: Secondary | ICD-10-CM | POA: Diagnosis not present

## 2018-12-25 DIAGNOSIS — I110 Hypertensive heart disease with heart failure: Principal | ICD-10-CM | POA: Diagnosis present

## 2018-12-25 DIAGNOSIS — Z9119 Patient's noncompliance with other medical treatment and regimen: Secondary | ICD-10-CM

## 2018-12-25 DIAGNOSIS — I509 Heart failure, unspecified: Secondary | ICD-10-CM

## 2018-12-25 DIAGNOSIS — Z9981 Dependence on supplemental oxygen: Secondary | ICD-10-CM

## 2018-12-25 DIAGNOSIS — I10 Essential (primary) hypertension: Secondary | ICD-10-CM | POA: Diagnosis not present

## 2018-12-25 DIAGNOSIS — R0602 Shortness of breath: Secondary | ICD-10-CM

## 2018-12-25 DIAGNOSIS — I5023 Acute on chronic systolic (congestive) heart failure: Secondary | ICD-10-CM | POA: Diagnosis not present

## 2018-12-25 DIAGNOSIS — I4891 Unspecified atrial fibrillation: Secondary | ICD-10-CM | POA: Diagnosis not present

## 2018-12-25 DIAGNOSIS — D649 Anemia, unspecified: Secondary | ICD-10-CM | POA: Diagnosis present

## 2018-12-25 DIAGNOSIS — E785 Hyperlipidemia, unspecified: Secondary | ICD-10-CM | POA: Diagnosis not present

## 2018-12-25 DIAGNOSIS — I5022 Chronic systolic (congestive) heart failure: Secondary | ICD-10-CM | POA: Diagnosis not present

## 2018-12-25 DIAGNOSIS — Z923 Personal history of irradiation: Secondary | ICD-10-CM | POA: Diagnosis not present

## 2018-12-25 DIAGNOSIS — Z951 Presence of aortocoronary bypass graft: Secondary | ICD-10-CM

## 2018-12-25 DIAGNOSIS — Z79890 Hormone replacement therapy (postmenopausal): Secondary | ICD-10-CM | POA: Diagnosis not present

## 2018-12-25 DIAGNOSIS — J9601 Acute respiratory failure with hypoxia: Secondary | ICD-10-CM | POA: Diagnosis not present

## 2018-12-25 DIAGNOSIS — I2510 Atherosclerotic heart disease of native coronary artery without angina pectoris: Secondary | ICD-10-CM | POA: Diagnosis present

## 2018-12-25 DIAGNOSIS — Z7984 Long term (current) use of oral hypoglycemic drugs: Secondary | ICD-10-CM | POA: Diagnosis not present

## 2018-12-25 DIAGNOSIS — E039 Hypothyroidism, unspecified: Secondary | ICD-10-CM | POA: Diagnosis not present

## 2018-12-25 DIAGNOSIS — J9621 Acute and chronic respiratory failure with hypoxia: Secondary | ICD-10-CM | POA: Diagnosis not present

## 2018-12-25 DIAGNOSIS — Z79899 Other long term (current) drug therapy: Secondary | ICD-10-CM | POA: Diagnosis not present

## 2018-12-25 DIAGNOSIS — Z87891 Personal history of nicotine dependence: Secondary | ICD-10-CM

## 2018-12-25 DIAGNOSIS — Z955 Presence of coronary angioplasty implant and graft: Secondary | ICD-10-CM

## 2018-12-25 DIAGNOSIS — J449 Chronic obstructive pulmonary disease, unspecified: Secondary | ICD-10-CM | POA: Diagnosis not present

## 2018-12-25 DIAGNOSIS — Z833 Family history of diabetes mellitus: Secondary | ICD-10-CM

## 2018-12-25 DIAGNOSIS — E119 Type 2 diabetes mellitus without complications: Secondary | ICD-10-CM | POA: Diagnosis not present

## 2018-12-25 DIAGNOSIS — Z6839 Body mass index (BMI) 39.0-39.9, adult: Secondary | ICD-10-CM

## 2018-12-25 DIAGNOSIS — Z8546 Personal history of malignant neoplasm of prostate: Secondary | ICD-10-CM

## 2018-12-25 DIAGNOSIS — Z7982 Long term (current) use of aspirin: Secondary | ICD-10-CM | POA: Diagnosis not present

## 2018-12-25 DIAGNOSIS — I350 Nonrheumatic aortic (valve) stenosis: Secondary | ICD-10-CM | POA: Diagnosis not present

## 2018-12-25 LAB — CBC
HEMATOCRIT: 28.4 % — AB (ref 39.0–52.0)
HEMOGLOBIN: 7.7 g/dL — AB (ref 13.0–17.0)
MCH: 24.6 pg — AB (ref 26.0–34.0)
MCHC: 27.1 g/dL — AB (ref 30.0–36.0)
MCV: 90.7 fL (ref 80.0–100.0)
Platelets: 206 10*3/uL (ref 150–400)
RBC: 3.13 MIL/uL — ABNORMAL LOW (ref 4.22–5.81)
RDW: 17.7 % — ABNORMAL HIGH (ref 11.5–15.5)
WBC: 6 10*3/uL (ref 4.0–10.5)
nRBC: 0 % (ref 0.0–0.2)

## 2018-12-25 LAB — BASIC METABOLIC PANEL
Anion gap: 9 (ref 5–15)
BUN: 22 mg/dL (ref 8–23)
CO2: 34 mmol/L — AB (ref 22–32)
Calcium: 8.9 mg/dL (ref 8.9–10.3)
Chloride: 96 mmol/L — ABNORMAL LOW (ref 98–111)
Creatinine, Ser: 1.06 mg/dL (ref 0.61–1.24)
GFR calc Af Amer: >60 mL/min (ref >60)
Glucose, Bld: 98 mg/dL (ref 70–99)
POTASSIUM: 3.6 mmol/L (ref 3.5–5.1)
Sodium: 139 mmol/L (ref 135–145)

## 2018-12-25 LAB — BRAIN NATRIURETIC PEPTIDE: B NATRIURETIC PEPTIDE 5: 272.2 pg/mL — AB (ref 0.0–100.0)

## 2018-12-25 LAB — I-STAT TROPONIN, ED
TROPONIN I, POC: 0.06 ng/mL (ref 0.00–0.08)
TROPONIN I, POC: 0.04 ng/mL (ref 0.00–0.08)

## 2018-12-25 LAB — PREPARE RBC (CROSSMATCH)

## 2018-12-25 MED ORDER — SODIUM CHLORIDE 0.9% FLUSH
3.0000 mL | Freq: Two times a day (BID) | INTRAVENOUS | Status: DC
  Administered 2018-12-26 (×2): 3 mL via INTRAVENOUS

## 2018-12-25 MED ORDER — SODIUM CHLORIDE 0.9% FLUSH: 3.0000 mL | INTRAVENOUS | Status: DC | PRN

## 2018-12-25 MED ORDER — ENOXAPARIN SODIUM 40 MG/0.4ML ~~LOC~~ SOLN
40.0000 mg | SUBCUTANEOUS | Status: DC
  Filled 2018-12-25: qty 0.4

## 2018-12-25 MED ORDER — ATORVASTATIN CALCIUM 80 MG PO TABS
80.0000 mg | ORAL_TABLET | Freq: Every day | ORAL | Status: DC
  Administered 2018-12-26: 80 mg via ORAL
  Filled 2018-12-25: qty 1

## 2018-12-25 MED ORDER — PANTOPRAZOLE SODIUM 40 MG PO TBEC
40.0000 mg | DELAYED_RELEASE_TABLET | Freq: Every day | ORAL | Status: DC
  Administered 2018-12-26: 40 mg via ORAL
  Filled 2018-12-25: qty 1

## 2018-12-25 MED ORDER — FUROSEMIDE 10 MG/ML IJ SOLN
40.0000 mg | Freq: Once | INTRAMUSCULAR | Status: AC
  Administered 2018-12-25: 40 mg via INTRAVENOUS
  Filled 2018-12-25: qty 4

## 2018-12-25 MED ORDER — FUROSEMIDE 10 MG/ML IJ SOLN
40.0000 mg | Freq: Once | INTRAMUSCULAR | Status: AC
  Administered 2018-12-26: 40 mg via INTRAVENOUS
  Filled 2018-12-25: qty 4

## 2018-12-25 MED ORDER — BISOPROLOL FUMARATE 5 MG PO TABS
5.0000 mg | ORAL_TABLET | Freq: Every day | ORAL | Status: DC
  Administered 2018-12-26: 5 mg via ORAL
  Filled 2018-12-25: qty 1

## 2018-12-25 MED ORDER — ACETAMINOPHEN 325 MG PO TABS: 650.0000 mg | ORAL_TABLET | ORAL | Status: DC | PRN

## 2018-12-25 MED ORDER — SODIUM CHLORIDE 0.9% FLUSH
3.0000 mL | Freq: Once | INTRAVENOUS | Status: AC
  Administered 2018-12-25: 3 mL via INTRAVENOUS

## 2018-12-25 MED ORDER — SODIUM CHLORIDE 0.9 % IV SOLN: 250.0000 mL | INTRAVENOUS | Status: DC | PRN

## 2018-12-25 MED ORDER — ASPIRIN 81 MG PO CHEW
81.0000 mg | CHEWABLE_TABLET | Freq: Every day | ORAL | Status: DC
  Administered 2018-12-26: 81 mg via ORAL
  Filled 2018-12-25: qty 1

## 2018-12-25 MED ORDER — FERROUS SULFATE 325 (65 FE) MG PO TABS
325.0000 mg | ORAL_TABLET | Freq: Every day | ORAL | Status: DC
  Administered 2018-12-26: 325 mg via ORAL
  Filled 2018-12-25: qty 1

## 2018-12-25 MED ORDER — INSULIN ASPART 100 UNIT/ML ~~LOC~~ SOLN: 0.0000 [IU] | Freq: Three times a day (TID) | SUBCUTANEOUS | Status: DC

## 2018-12-25 MED ORDER — IPRATROPIUM-ALBUTEROL 0.5-2.5 (3) MG/3ML IN SOLN: 3.0000 mL | RESPIRATORY_TRACT | Status: DC | PRN

## 2018-12-25 MED ORDER — LEVOTHYROXINE SODIUM 75 MCG PO TABS
75.0000 ug | ORAL_TABLET | Freq: Every day | ORAL | Status: DC
  Administered 2018-12-26: 75 ug via ORAL
  Filled 2018-12-25: qty 1

## 2018-12-25 MED ORDER — SODIUM CHLORIDE 0.9% IV SOLUTION
Freq: Once | INTRAVENOUS | Status: AC
  Administered 2018-12-26: 01:00:00 via INTRAVENOUS

## 2018-12-25 NOTE — ED Notes (Signed)
Called lab to add on BNP. ?

## 2018-12-25 NOTE — ED Triage Notes (Signed)
Pt endorses weakness, shob x 1 week. Went to pcp and sent here for low hgb 7.9 today. spo2 80% on RA in triage, placed on 3L [place].

## 2018-12-25 NOTE — ED Provider Notes (Signed)
[facility] EMER[facility] DEPARTMENT Provider Note   CSN: [id number] Arrival date & time: 12/25/18  1738    History   Chief Complaint Chief Complaint  Patient presents with  . Abnormal Lab    HPI Jeremiah [name] is a 72 y.o. male.  HPI   Jeremiah [name] is a 73 y.o. male with PMH of A. fib not currently anticoagulated, hypertension, hyperlipidemia, history of prostate cancer status post radiation, morbid obesity, CAD status post mult-vessel CABG, OSA, paronychia, insomnia who presents with his wife at the bedside with about 5 days of worsening generalized weakness and fatigue and an aching sensation in his chest for approximately 2 to 3 days.  The patient reports that he is on oxygen at home as needed and usually uses about 2 to 2-1/2 L.  Usually only uses oxygen at night.  Has had much more difficulty sleeping the last for 5 nights as his dyspnea is worse when he lays down and he has worsening anxiety attacks.  He denies diaphoresis or lightheadedness associated with his shortness of breath but states his shortness of breath has been so profound that he cannot exert himself for the last few days.  He slept pretty much all day yesterday sitting up in the recliner on 3 to 4 L nasal cannula.  He is coughing but is nonproductive.  No fevers or chills.  Eating and drinking with normal stool and urine output.  Compliant with all his medications at home.  He feels like his legs are slightly more swollen than normal.  Patient and his wife report that he has gained about 12 to 14 pounds in the last 1 to 2 months.  He states he saw his PCP earlier today who noted that his hemoglobin was in the high sevens and recommended he come to the ED.  Past Medical History:  Diagnosis Date  . A-fib ([place])   . CAD (coronary artery disease)    a. 2006: CABG in 2006 with LIMA-LAD, SVG-OM1, and SVG-RPDA  . Chronic pain 03/21/2016  . Essential hypertension 03/21/2016  . History of prostate cancer 03/21/2016  .  HLD (hyperlipidemia)   . HTN (hypertension)   . Hx of CABG 2006  . Hypercholesteremia 03/21/2016  . Hypothyroidism   . Medication management 03/21/2016  . Morbid obesity ([place]) 03/21/2016  . Myocardial infarct ([place])   . OSA (obstructive sleep apnea) 03/21/2016  . Pain in right ankle and joints of right foot 03/21/2016  . Paronychia 03/21/2016  . Primary insomnia 03/21/2016  . Prostate cancer ([place]) 2008  . Tobacco dependence 03/21/2016    Patient Active Problem List   Diagnosis Date Noted  . CHF exacerbation ([place]) 12/25/2018  . Carotid artery calcification 09/09/2018  . Coronary artery disease involving coronary bypass graft of native heart without angina pectoris 07/22/2017  . Acute on chronic systolic (congestive) heart failure ([place]) 06/10/2017  . COPD GOLD III  04/03/2017  . Chronic respiratory failure with hypoxia and hypercapnia ([place]) 04/03/2017  . Acute pulmonary edema ([facility])   . Acute on chronic diastolic ACC/AHA stage C congestive heart failure ([place])   . Acute exacerbation of congestive heart failure ([place]) 02/26/2017  . Hyperkalemia 02/26/2017  . Prolonged QT interval 02/26/2017  . Anemia 02/26/2017  . Skin abscess 02/13/2017  . Dyspnea on exertion 02/12/2017  . Enteritis due to Clostridium difficile 01/05/2017  . GI bleed 01/03/2017  . Symptomatic anemia 01/03/2017  . Hypothyroidism, acquired 01/03/2017  . Cough   . Diarrhea   .  AVNRT (AV nodal re-entry tachycardia) ([place]) 08/24/2016  . Chest pain 05/05/2016  . NSTEMI (non-ST elevated myocardial infarction) ([place]) 05/05/2016  . SVT (supraventricular tachycardia) ([place]) 03/21/2016  . S/P CABG x 3 03/21/2016  . Morbid obesity due to excess calories ([place]) complicated by hbp/hyperlipidemia  03/21/2016  . Essential hypertension 03/21/2016  . Hypercholesteremia 03/21/2016  . Paronychia 03/21/2016  . Tobacco dependence 03/21/2016  . Pain in right ankle and joints of right foot 03/21/2016  . Chronic pain 03/21/2016  . OSA  (obstructive sleep apnea) 03/21/2016  . Medication management 03/21/2016  . History of prostate cancer 03/21/2016  . Primary insomnia 03/21/2016  . Post-traumatic osteoarthritis of right ankle 11/07/2015  . Closed displaced fracture of medial malleolus of right tibia 10/24/2015    Past Surgical History:  Procedure Laterality Date  . ANKLE SURGERY  12/2013  . CARDIAC CATHETERIZATION N/A 05/08/2016   Procedure: Left Heart Cath and Cors/Grafts Angiography;  Surgeon: Belva Crome, MD;  Location: [facility];  Service: Cardiovascular;  Laterality: N/A;  . CORONARY ARTERY BYPASS GRAFT  2006   x3  . ELECTROPHYSIOLOGIC STUDY N/A 08/24/2016   Procedure: SVT Ablation;  Surgeon: Will Meredith Leeds, MD;  Location: [facility];  Service: Cardiovascular;  Laterality: N/A;  . PROSTATE SURGERY  2008  . PTCA          Home Medications    Prior to Admission medications   Medication Sig Start Date End Date Taking? Authorizing Provider  aspirin 81 MG tablet Take 1 tablet (81 mg total) by mouth daily. Hold for 5 days Patient taking differently: Take 81 mg by mouth daily.  01/05/17  Yes Rai, Ripudeep K, MD  atorvastatin (LIPITOR) 80 MG tablet Take 80 mg by mouth daily.   Yes [provider]  betamethasone dipropionate 0.05 % lotion Apply 1 application topically 2 (two) times daily.    Yes [provider]  bisoprolol (ZEBETA) 5 MG tablet TAKE 1 TABLET(5 MG) BY MOUTH DAILY Patient taking differently: Take 5 mg by mouth daily.  04/22/18  Yes Tanda Rockers, MD  ferrous sulfate 325 (65 FE) MG tablet Take 325 mg by mouth daily with breakfast.   Yes [provider]  furosemide (LASIX) 40 MG tablet TAKE 1 TABLET(40 MG) BY MOUTH DAILY Patient taking differently: Take 40 mg by mouth daily.  12/19/18  Yes Hilty, Nadean Corwin, MD  glimepiride (AMARYL) 1 MG tablet Take 1 tablet by mouth daily. 07/26/17  Yes [provider]  ipratropium-albuterol (DUONEB) 0.5-2.5 (3)  MG/3ML SOLN Take 3 mLs by nebulization every 4 (four) hours as needed. 03/02/17  Yes Theodis Blaze, MD  levothyroxine (SYNTHROID, LEVOTHROID) 75 MCG tablet Take 75 mcg by mouth daily before breakfast.   Yes [provider]  metFORMIN (GLUCOPHAGE) 500 MG tablet Take 750 mg by mouth 2 (two) times daily with a meal.    Yes [provider]  OXYGEN 2.5 lpm with sleep only [facility]   Yes [provider]  pantoprazole (PROTONIX) 40 MG tablet TAKE 1 TABLET(40 MG) BY MOUTH TWICE DAILY BEFORE A MEAL Patient taking differently: Take 40 mg by mouth daily.  04/08/18  Yes Tanda Rockers, MD  Turmeric 500 MG TABS Take 500 mg by mouth daily.   Yes [provider]    Family History Family History  Problem Relation Age of Onset  . Dementia Mother   . Diabetes Sister     Social History Social History   Tobacco  Use  . Smoking status: Former Smoker    Packs/day: 0.50    Years: 60.00    Pack years: 30.00    Types: Cigarettes    Last attempt to quit: 12/15/2016    Years since quitting: 2.0  . Smokeless tobacco: Never Used  Substance Use Topics  . Alcohol use: Yes    Comment: very seldom  . Drug use: No     Allergies   Scallops [shellfish allergy] and Fluzone [flu virus vaccine]   Review of Systems Review of Systems  Constitutional: Positive for activity change and fatigue. Negative for chills and fever.  HENT: Negative for ear pain and sore throat.   Eyes: Negative for pain and visual disturbance.  Respiratory: Positive for cough and shortness of breath.   Cardiovascular: Positive for leg swelling. Negative for chest pain and palpitations.  Gastrointestinal: Negative for abdominal pain and vomiting.  Genitourinary: Negative for dysuria and hematuria.  Musculoskeletal: Negative for arthralgias and back pain.  Skin: Negative for color change and rash.  Neurological: Negative for seizures and syncope.  All other systems reviewed and are negative.    Physical  Exam Updated Vital Signs BP (!) 110/54 (BP Location: Left Arm)   Pulse 77   Temp 98.2 F (36.8 C) (Oral)   Resp (!) 22   SpO2 (!) 83%   Physical Exam Vitals signs and nursing note reviewed.  Constitutional:      Appearance: He is well-developed. He is obese. He is ill-appearing. He is not diaphoretic.  HENT:     Head: Normocephalic and atraumatic.  Eyes:     Conjunctiva/sclera: Conjunctivae normal.  Neck:     Musculoskeletal: Neck supple.  Cardiovascular:     Rate and Rhythm: Normal rate and regular rhythm.     Heart sounds: No murmur.  Pulmonary:     Effort: Pulmonary effort is normal. No respiratory distress.     Breath sounds: Examination of the right-lower field reveals decreased breath sounds. Examination of the left-lower field reveals decreased breath sounds. Decreased breath sounds present.  Abdominal:     General: Abdomen is flat.     Palpations: Abdomen is soft.     Tenderness: There is no abdominal tenderness. Negative signs include Murphy's sign, Rovsing's sign and McBurney's sign.  Musculoskeletal:     Right lower leg: Edema (trace) present.     Left lower leg: Edema (1+ pitting edema) present.  Skin:    General: Skin is warm and dry.  Neurological:     Mental Status: He is alert.  Psychiatric:        Behavior: Behavior is cooperative.      ED Treatments / Results  Labs (all labs ordered are listed, but only abnormal results are displayed) Labs Reviewed  BASIC METABOLIC PANEL - Abnormal; Notable for the following components:      Result Value   Chloride 96 (*)    CO2 34 (*)    All other components within normal limits  CBC - Abnormal; Notable for the following components:   RBC 3.13 (*)    Hemoglobin 7.7 (*)    HCT 28.4 (*)    MCH 24.6 (*)    MCHC 27.1 (*)    RDW 17.7 (*)    All other components within normal limits  BRAIN NATRIURETIC PEPTIDE - Abnormal; Notable for the following components:   B Natriuretic Peptide 272.2 (*)    All other  components within normal limits  VITAMIN B12  FOLATE  IRON AND  TIBC  FERRITIN  RETICULOCYTES  TROPONIN I  TROPONIN I  TROPONIN I  I-STAT TROPONIN, ED  I-STAT TROPONIN, ED  TYPE AND SCREEN    EKG EKG Interpretation  Date/Time:  Thursday December 25 2018 19:30:40 EST Ventricular Rate:  69 PR Interval:    QRS Duration: 91 QT Interval:  405 QTC Calculation: 434 R Axis:   52 Text Interpretation:  Sinus rhythm Ventricular premature complex Abnormal T, consider ischemia, diffuse leads Baseline wander in lead(s) V2 TWI unchanged  Confirmed by Wandra Arthurs [phone]) on 12/25/2018 7:45:31 PM   Radiology Dg Chest 2 View  Result Date: 12/25/2018 CLINICAL DATA:  Shortness of breath for 1 week. Congestive heart failure. Coronary artery disease. EXAM: CHEST - 2 VIEW COMPARISON:  02/25/2017 FINDINGS: Stable mild cardiomegaly. Prior CABG. Stable pulmonary interstitial prominence. Stable mild bilateral pleural thickening. No evidence of acute infiltrate. IMPRESSION: Stable cardiomegaly and pleural thickening.  No acute findings. Electronically Signed   By: Earle Gell M.D.   On: 12/25/2018 17:04    Procedures Procedures (including critical care time)  Medications Ordered in ED Medications  sodium chloride flush (NS) 0.9 % injection 3 mL (3 mLs Intravenous Given 12/25/18 1948)  furosemide (LASIX) injection 40 mg (40 mg Intravenous Given 12/25/18 1950)     Initial Impression / Assessment and Plan / ED Course  I have reviewed the triage vital signs and the nursing notes.  Pertinent labs & imaging results that were available during my care of the patient were reviewed by me and considered in my medical decision making (see chart for details).      MDM:  Imaging: CXR shows stable cardiomegaly and pleural thickening. No acute findings.    ED Provider Interpretation of EKG: Sinus rhythm with rate of 69 bpm, normal axis, no ST segment elevation or depression.  Inverted T waves in leads II, 3,  aVF, biphasic in V3, and biphasic in V4 to V6.  These T wave abnormalities were seen on prior EKGs.  Labs: CBC shows a hemoglobin of 7.7 with prior 8.2-8.5), BMP with CO2 of 34 (prior 33-37), CL 96 near baseline otherwise unremarkable, troponin 0.06  On initial evaluation, patient appears chronically ill. Afebrile and hemodynamically stable. Alert and oriented x4, pleasant, and cooperative.  Presents with dyspnea and fatigue as above.  Echo from February 27, 2017 showing LVEF of 45-50%.  Last cath 2017 with no additional intervention performed at that time.  On exam, patient has bilateral lower extremity edema as detailed above.  No erythema or tenderness to palpation.  Low suspicion for DVT and PE at this time.  His story is most consistent with worsening volume overload consistent with his known CHF.  He has gained a significant amount of weight in the last 2 years but most profoundly in the last 6 to 8 weeks.  Endorses worsening dyspnea with associated anxiety attacks while lying down at night.  Increase use of his home O2 that was previously as needed and was low 80s on his home O2 after moving to the stretcher for initial triage today.  No history of COPD but does have a history of OHS.  CO2 elevated on his CMP as above which is largely unchanged from his prior consistent with OHS.  No venous blood gas ordered as the patient has no change in his mental status from baseline.  No wheezing on exam.  Chest x-ray shows stable findings as detailed above and EKG shows no evidence for acute ischemia or arrhythmia.  Troponin is 0.06.  Delta troponin pending.  Low suspicion overall for ACS at this time given that he has no chest pain.  He has stable anemia and no recent evidence for GI loss or otherwise.  Do not feel that transfusion is indicated and patient would benefit more from diuresis.   Renal function preserved and his BNP is 272.  Ranging 90-349 in the past but does appear to have a story consistent with  CHF.  Given 40 mg of IV Lasix.  No evidence for infectious etiology at this time.  Patient admitted to medicine teaching service in stable condition for further monitoring and evaluation.  The plan for this patient was discussed with Dr. Darl Householder who voiced agreement and who oversaw evaluation and treatment of this patient.   The patient was fully informed and involved with the history taking, evaluation, workup including labs/images, and plan. The patient's concerns and questions were addressed to the patient's satisfaction and he expressed agreement with the plan to admit.    Final Clinical Impressions(s) / ED Diagnoses   Final diagnoses:  Acute on chronic respiratory failure with hypoxia ([place])  Acute congestive heart failure, unspecified heart failure type [facility])  Shortness of breath    ED Discharge Orders    None       Shakeisha Horine, Rodena Goldmann, MD 12/25/18 2234    Drenda Freeze, MD 12/25/18 2337

## 2018-12-25 NOTE — H&P (Signed)
[facility] Admission History and Physical Service Pager: [phone]  Patient name: Jeremiah [name] Medical record number: [id number] Date of birth: 11-12-1946 Age: 73 y.o. Gender: male  Primary Care Provider: [name], MD Consultants: none Code Status: full  Chief Complaint: fatigue  Assessment and Plan: Jeremiah [name] is a 73 y.o. male presenting with fatigue/weakness and SOB . PMH is significant for HFrEF 45-50%, DM, HTN, HLD, h/o prostate CA s/p radiation, h/o Afib not on anticoag s/p ablation, morbid obesity, CAD s/p CABG, OSA  Acute on Chronic hypoxic respiratory failure 2/2 CHF exacerbation. Patient presented with fatigue, orthopnea, LE edema and SOB. BNP elevated at 272.2 and noted to have bilateral pleural thickening of CXR. Likely some demand as troponin istat 0.06>0.04 but EKG sinus rhythm without ST changes (old t-wave inversion on prior EKG in 2018) and no chest pain. Last echo 02/27/17: EF 45-50%. Follows with cardiology Dr. Debara Pickett. Likely from increased salt intake with some dietary indiscretions over the holiday season. States is up from dry weight of 245lbs but also thinks may have gained weight from increased po intake. Weight on admit 251lbs. No fever or leukocytosis so no concern for PNA.  - admit to inpatient, attending Dr. Owens Shark - monitor on telemetry - s/p IV lasix 40mg  x1 - strict I/Os, daily wts - echo - trend trops - am EKG - continue home bisoprolol  - consider adding ACEI/ARB later during hospital stay as BP allows - PT/OT  Symptomatic Anemia: Hgb 7.7 (Baseline: 8.0-8.5). Patient has fatigue and lightheadedness, reports similar symptoms to when he has needed transfusions in the past.  Has not had colonoscopy in 10+yrs but denies hematochezia or melena.  - anemia panel order prior to transfusion - transfuse 1U RBC given patient is symptomatic with fatigue and h/o CAD - give additional IV lasix 40mg  x1 post transfusion - monitor CBC -  continue home ferrous sulfate  CAD s/p CABG, stable - continue home ASA81 - transfuse 1U RBC as per above  DM: At home on metformin 750mg  BID and glimepiride 1mg  qd. - holding home meds while admitted - check a1c - monitor CBGs - mSSI  HTN: controlled. BP soft on admit 110/54 - continue lasix and home bisoprolol as per above  HLD: - continue home atorvastatin  Hypothyroidism: At home on levothyroxine 49mcg qd. Last TSH 02/26/17 was 2.448 - continue home med - check TSH  OSA: At home on 2L O2 via [place] at night, is noncompliant on home CPAP - Declined CPAP - supplemental oxygen as per above  FEN/GI: heart healthy carb modified, protonix Prophylaxis: lovenox  Disposition: admit to telemetry   History of Present Illness:  Jeremiah [name] is a 73 y.o. male presenting with fatigue. Patient is accompanied by his wife at bedside.  He states that he came to the ER today because he saw PCP this morning, who did bloodwork and told him to come in to be evaluated. He went to the doctor today because about 5 days ago he started feeling tired and lethargic with shortness of breath. He was having difficulty sleeping because felt anxious and needed to sleep in recliner. His breathing progressively worsening to the point he had difficulty even walking across room without using oxygen. He is usually on 2L oxygen only at night because of sleep apnea, does not use his CPAP. He is now using 3.5-4L via [place] throughout the day. He also has noticed over the past few days that has had worsening leg swelling in  both feet. He also has noticed some weight gain over the last few weeks. He thinks this may have been due to eating "everything I am not supposed to" over the holiday season. He tries not to add salt to his food but does love red meat and so will have some salt with that.   He has not been sick recently. Denies fever, chills, diaphoresis, feeling flushed. He does have some lightheadedness and dizziness. He  reports good medication compliance   Review Of Systems: Per HPI with the following additions:   Review of Systems  Constitutional: Positive for malaise/fatigue. Negative for chills, diaphoresis and fever.  HENT: Negative for congestion.   Respiratory: Positive for cough and shortness of breath. Negative for hemoptysis.   Cardiovascular: Positive for leg swelling. Negative for chest pain and palpitations.  Gastrointestinal: Negative for abdominal pain, blood in stool, diarrhea, melena, nausea and vomiting.  Genitourinary: Negative for dysuria, frequency, hematuria and urgency.  Neurological: Positive for dizziness. Negative for focal weakness.    Patient Active Problem List   Diagnosis Date Noted  . Carotid artery calcification 09/09/2018  . Coronary artery disease involving coronary bypass graft of native heart without angina pectoris 07/22/2017  . Acute on chronic systolic (congestive) heart failure ([place]) 06/10/2017  . COPD GOLD III  04/03/2017  . Chronic respiratory failure with hypoxia and hypercapnia ([place]) 04/03/2017  . Acute pulmonary edema ([facility])   . Acute on chronic diastolic ACC/AHA stage C congestive heart failure ([place])   . Acute exacerbation of congestive heart failure ([place]) 02/26/2017  . Hyperkalemia 02/26/2017  . Prolonged QT interval 02/26/2017  . Anemia 02/26/2017  . Skin abscess 02/13/2017  . Dyspnea on exertion 02/12/2017  . Enteritis due to Clostridium difficile 01/05/2017  . GI bleed 01/03/2017  . Symptomatic anemia 01/03/2017  . Hypothyroidism, acquired 01/03/2017  . Cough   . Diarrhea   . AVNRT (AV nodal re-entry tachycardia) ([place]) 08/24/2016  . Chest pain 05/05/2016  . NSTEMI (non-ST elevated myocardial infarction) ([place]) 05/05/2016  . SVT (supraventricular tachycardia) ([place]) 03/21/2016  . S/P CABG x 3 03/21/2016  . Morbid obesity due to excess calories ([place]) complicated by hbp/hyperlipidemia  03/21/2016  . Essential hypertension 03/21/2016  .  Hypercholesteremia 03/21/2016  . Paronychia 03/21/2016  . Tobacco dependence 03/21/2016  . Pain in right ankle and joints of right foot 03/21/2016  . Chronic pain 03/21/2016  . OSA (obstructive sleep apnea) 03/21/2016  . Medication management 03/21/2016  . History of prostate cancer 03/21/2016  . Primary insomnia 03/21/2016  . Post-traumatic osteoarthritis of right ankle 11/07/2015  . Closed displaced fracture of medial malleolus of right tibia 10/24/2015    Past Medical History: Past Medical History:  Diagnosis Date  . A-fib ([place])   . CAD (coronary artery disease)    a. 2006: CABG in 2006 with LIMA-LAD, SVG-OM1, and SVG-RPDA  . Chronic pain 03/21/2016  . Essential hypertension 03/21/2016  . History of prostate cancer 03/21/2016  . HLD (hyperlipidemia)   . HTN (hypertension)   . Hx of CABG 2006  . Hypercholesteremia 03/21/2016  . Hypothyroidism   . Medication management 03/21/2016  . Morbid obesity ([place]) 03/21/2016  . Myocardial infarct ([place])   . OSA (obstructive sleep apnea) 03/21/2016  . Pain in right ankle and joints of right foot 03/21/2016  . Paronychia 03/21/2016  . Primary insomnia 03/21/2016  . Prostate cancer ([place]) 2008  . Tobacco dependence 03/21/2016    Past Surgical History: Past Surgical History:  Procedure Laterality Date  .  ANKLE SURGERY  12/2013  . CARDIAC CATHETERIZATION N/A 05/08/2016   Procedure: Left Heart Cath and Cors/Grafts Angiography;  Surgeon: Belva Crome, MD;  Location: [facility];  Service: Cardiovascular;  Laterality: N/A;  . CORONARY ARTERY BYPASS GRAFT  2006   x3  . ELECTROPHYSIOLOGIC STUDY N/A 08/24/2016   Procedure: SVT Ablation;  Surgeon: Will Meredith Leeds, MD;  Location: [facility];  Service: Cardiovascular;  Laterality: N/A;  . PROSTATE SURGERY  2008  . PTCA      Social History: Social History   Tobacco Use  . Smoking status: Former Smoker    Packs/day: 0.50    Years: 60.00    Pack years: 30.00    Types: Cigarettes     Last attempt to quit: 12/15/2016    Years since quitting: 2.0  . Smokeless tobacco: Never Used  Substance Use Topics  . Alcohol use: Yes    Comment: very seldom  . Drug use: No   Additional social history: Lives at home with wife  Please also refer to relevant sections of EMR.  Family History: Family History  Problem Relation Age of Onset  . Dementia Mother   . Diabetes Sister      Allergies and Medications: Allergies  Allergen Reactions  . Scallops [Shellfish Allergy] Other (See Comments)    Rash; shortness of breath  . Fluzone [Flu Virus Vaccine] Nausea And Vomiting, Palpitations and Rash   No current facility-administered medications on file prior to encounter.    Current Outpatient Medications on File Prior to Encounter  Medication Sig Dispense Refill  . aspirin 81 MG tablet Take 1 tablet (81 mg total) by mouth daily. Hold for 5 days (Patient taking differently: Take 81 mg by mouth daily. ) 30 tablet   . atorvastatin (LIPITOR) 80 MG tablet Take 80 mg by mouth daily.    . betamethasone dipropionate 0.05 % lotion Apply 1 application topically 2 (two) times daily.     . bisoprolol (ZEBETA) 5 MG tablet TAKE 1 TABLET(5 MG) BY MOUTH DAILY (Patient taking differently: Take 5 mg by mouth daily. ) 60 tablet 5  . ferrous sulfate 325 (65 FE) MG tablet Take 325 mg by mouth daily with breakfast.    . furosemide (LASIX) 40 MG tablet TAKE 1 TABLET(40 MG) BY MOUTH DAILY (Patient taking differently: Take 40 mg by mouth daily. ) 90 tablet 3  . glimepiride (AMARYL) 1 MG tablet Take 1 tablet by mouth daily.    Marland Kitchen ipratropium-albuterol (DUONEB) 0.5-2.5 (3) MG/3ML SOLN Take 3 mLs by nebulization every 4 (four) hours as needed. 360 mL 0  . levothyroxine (SYNTHROID, LEVOTHROID) 75 MCG tablet Take 75 mcg by mouth daily before breakfast.    . metFORMIN (GLUCOPHAGE) 500 MG tablet Take 750 mg by mouth at bedtime.     . OXYGEN 2.5 lpm with sleep only AHC    . pantoprazole (PROTONIX) 40 MG tablet  TAKE 1 TABLET(40 MG) BY MOUTH TWICE DAILY BEFORE A MEAL (Patient taking differently: Take 40 mg by mouth daily. ) 60 tablet 2  . Turmeric 500 MG TABS Take 500 mg by mouth daily.      Objective: BP (!) 110/54 (BP Location: Left Arm)   Pulse 77   Temp 98.2 F (36.8 C) (Oral)   Resp (!) 22   SpO2 (!) 83%  Exam: General: sitting up on side of bed comfortably, in NAD  Eyes: pupils are equal and round. EOMI ENTM: MMM, nose normal Neck: supple, normal  ROM. + JVD Cardiovascular: normal s1 and s2, regular, no murmurs Respiratory: slight inspiratory wheezing throughout, good air movement. No rhonchi Gastrointestinal: soft, nontender, nondistended, +bowel sounds. obese MSK: moving all limbs equally, normal tone. 1+ pitting edema in bilateral LE Derm: warm and dry  Neuro: alert and awake, grossly normal Psych: pleasant, appropriate affect   Labs and Imaging: CBC BMET  Recent Labs  Lab 12/25/18 1808  WBC 6.0  HGB 7.7*  HCT 28.4*  PLT 206   Recent Labs  Lab 12/25/18 1808  NA 139  K 3.6  CL 96*  CO2 34*  BUN 22  CREATININE 1.06  GLUCOSE 98  CALCIUM 8.9     BNP: 272.2 Troponin istat: 0.06 > 0.04  EKG: old TWI, stable. No changes from previous   Dg Chest 2 View  Result Date: 12/25/2018 CLINICAL DATA:  Shortness of breath for 1 week. Congestive heart failure. Coronary artery disease. EXAM: CHEST - 2 VIEW COMPARISON:  02/25/2017 FINDINGS: Stable mild cardiomegaly. Prior CABG. Stable pulmonary interstitial prominence. Stable mild bilateral pleural thickening. No evidence of acute infiltrate. IMPRESSION: Stable cardiomegaly and pleural thickening.  No acute findings. Electronically Signed   By: Earle Gell M.D.   On: 12/25/2018 17:04   Dg Foot Complete Right  Result Date: 12/02/2018 Please see detailed radiograph report in office note.   Bufford Lope, DO 12/25/2018, 9:42 PM PGY-3, [place] Intern pager: [phone], text pages welcome

## 2018-12-26 ENCOUNTER — Inpatient Hospital Stay (HOSPITAL_COMMUNITY): Payer: Federal, State, Local not specified - PPO

## 2018-12-26 ENCOUNTER — Other Ambulatory Visit: Payer: Self-pay

## 2018-12-26 DIAGNOSIS — J9621 Acute and chronic respiratory failure with hypoxia: Secondary | ICD-10-CM

## 2018-12-26 DIAGNOSIS — I350 Nonrheumatic aortic (valve) stenosis: Secondary | ICD-10-CM

## 2018-12-26 DIAGNOSIS — D649 Anemia, unspecified: Secondary | ICD-10-CM | POA: Diagnosis not present

## 2018-12-26 DIAGNOSIS — I110 Hypertensive heart disease with heart failure: Secondary | ICD-10-CM | POA: Diagnosis not present

## 2018-12-26 DIAGNOSIS — I2510 Atherosclerotic heart disease of native coronary artery without angina pectoris: Secondary | ICD-10-CM | POA: Diagnosis not present

## 2018-12-26 DIAGNOSIS — I5023 Acute on chronic systolic (congestive) heart failure: Secondary | ICD-10-CM | POA: Diagnosis not present

## 2018-12-26 LAB — FOLATE: Folate: 12.3 ng/mL (ref >5.9)

## 2018-12-26 LAB — BASIC METABOLIC PANEL
Anion gap: 11 (ref 5–15)
BUN: 22 mg/dL (ref 8–23)
CO2: 34 mmol/L — ABNORMAL HIGH (ref 22–32)
Calcium: 8.8 mg/dL — ABNORMAL LOW (ref 8.9–10.3)
Chloride: 95 mmol/L — ABNORMAL LOW (ref 98–111)
Creatinine, Ser: 0.97 mg/dL (ref 0.61–1.24)
GFR calc Af Amer: >60 mL/min (ref >60)
GFR calc non Af Amer: >60 mL/min (ref >60)
Glucose, Bld: 153 mg/dL — ABNORMAL HIGH (ref 70–99)
Potassium: 3.5 mmol/L (ref 3.5–5.1)
Sodium: 140 mmol/L (ref 135–145)

## 2018-12-26 LAB — GLUCOSE, CAPILLARY
Glucose-Capillary: 143 mg/dL — ABNORMAL HIGH (ref 70–99)
Glucose-Capillary: 144 mg/dL — ABNORMAL HIGH (ref 70–99)
Glucose-Capillary: 146 mg/dL — ABNORMAL HIGH (ref 70–99)

## 2018-12-26 LAB — IRON AND TIBC
Iron: 20 ug/dL — ABNORMAL LOW (ref 45–182)
Saturation Ratios: 4 % — ABNORMAL LOW (ref 17.9–39.5)
TIBC: 487 ug/dL — ABNORMAL HIGH (ref 250–450)
UIBC: 467 ug/dL

## 2018-12-26 LAB — HEMOGLOBIN AND HEMATOCRIT, BLOOD
HCT: 30.8 % — ABNORMAL LOW (ref 39.0–52.0)
Hemoglobin: 8.8 g/dL — ABNORMAL LOW (ref 13.0–17.0)

## 2018-12-26 LAB — TROPONIN I
Troponin I: 0.03 ng/mL (ref <0.03)
Troponin I: 0.04 ng/mL (ref <0.03)
Troponin I: 0.05 ng/mL (ref <0.03)

## 2018-12-26 LAB — ECHOCARDIOGRAM COMPLETE
Height: 67 in
Weight: 4072 oz

## 2018-12-26 LAB — HEMOGLOBIN A1C
Hgb A1c MFr Bld: 7.1 % — ABNORMAL HIGH (ref 4.8–5.6)
MEAN PLASMA GLUCOSE: 157.07 mg/dL

## 2018-12-26 LAB — CBC
HEMATOCRIT: 30.7 % — AB (ref 39.0–52.0)
Hemoglobin: 8.9 g/dL — ABNORMAL LOW (ref 13.0–17.0)
MCH: 25.8 pg — ABNORMAL LOW (ref 26.0–34.0)
MCHC: 29 g/dL — ABNORMAL LOW (ref 30.0–36.0)
MCV: 89 fL (ref 80.0–100.0)
Platelets: 197 10*3/uL (ref 150–400)
RBC: 3.45 MIL/uL — ABNORMAL LOW (ref 4.22–5.81)
RDW: 17.2 % — ABNORMAL HIGH (ref 11.5–15.5)
WBC: 6.8 10*3/uL (ref 4.0–10.5)
nRBC: 0 % (ref 0.0–0.2)

## 2018-12-26 LAB — FERRITIN: Ferritin: 7 ng/mL — ABNORMAL LOW (ref 24–336)

## 2018-12-26 LAB — TSH: TSH: 2.89 u[IU]/mL (ref 0.350–4.500)

## 2018-12-26 LAB — RETICULOCYTES
IMMATURE RETIC FRACT: 35.3 % — AB (ref 2.3–15.9)
RBC.: 3.12 MIL/uL — ABNORMAL LOW (ref 4.22–5.81)
RETIC COUNT ABSOLUTE: 58.5 10*3/uL (ref 19.0–186.0)
Retic Ct Pct: 3.6 % — ABNORMAL HIGH (ref 0.4–3.1)

## 2018-12-26 LAB — VITAMIN B12: Vitamin B-12: 187 pg/mL (ref 180–914)

## 2018-12-26 MED ORDER — PERFLUTREN LIPID MICROSPHERE
1.0000 mL | INTRAVENOUS | Status: DC | PRN
  Administered 2018-12-26: 5 mL via INTRAVENOUS
  Filled 2018-12-26: qty 10

## 2018-12-26 NOTE — Progress Notes (Signed)
Acknowledged order for new PT evaluation and imminent discharge. Pt already evaluated by PT/OT this morning and independent with mobility. No acute or follow-up needs identified. Please re-consult if new needs arise.  Mabeline Caras, PT, DPT Acute Rehabilitation Services  Pager [phone] Office [phone]

## 2018-12-26 NOTE — Evaluation (Signed)
Physical Therapy Evaluation Patient Details Name: Jeremiah [name] MRN: [id number] DOB: 1945-12-04 Today's Date: 12/26/2018   History of Present Illness  Pt is a 73 y.o. male admitted 12/25/18 with fatigue and SOB. Worked up for CHF exacerbation and symptomatic anemia. PMH includes HF, DM, HTN, prostate CA, afib, CAD s/p CABG, OSA, obesity.    Clinical Impression  Patient evaluated by Physical Therapy with no further acute PT needs identified. PTA, pt indep and lives with wife who works during day. Pt reports wearing supplemental O2 in evenings and with strenuous activity. Today, pt indep with all mobility. Pt insistent on ambulating without O2; SpO2 down to 81% on RA. Pt agreeable to replace nasal cannula. All education has been completed and the patient has no further questions. Acute PT is signing off. Thank you for this referral.    Follow Up Recommendations No PT follow up    Equipment Recommendations  None recommended by PT    Recommendations for Other Services       Precautions / Restrictions Precautions Precautions: None Restrictions Weight Bearing Restrictions: No      Mobility  Bed Mobility               General bed mobility comments: Received sitting in recliner  Transfers Overall transfer level: Independent                  Ambulation/Gait Ambulation/Gait assistance: Independent Gait Distance (Feet): 450 Feet Assistive device: None Gait Pattern/deviations: WFL(Within Functional Limits)   Gait velocity interpretation: >4.37 ft/sec, indicative of normal walking speed General Gait Details: Pt jumping up, threw off oxygen and walking quickly into hall despite cues to use O2. SpO2 down to 81% upon return to room, pt replaced nasal canula  Stairs            Wheelchair Mobility    Modified Rankin (Stroke Patients Only)       Balance Overall balance assessment: No apparent balance deficits (not formally assessed)                                           Pertinent Vitals/Pain Pain Assessment: No/denies pain    Home Living Family/patient expects to be discharged to:: Private residence Living Arrangements: Spouse/significant other Available Help at Discharge: Family;Available PRN/intermittently Type of Home: House Home Access: Stairs to enter   [employer] of Steps: 4 Home Layout: Two level   Additional Comments: Wife works during day    Prior Function Level of Independence: Independent         Comments: Wears O2 in evenings and with strenuous activity     Hand Dominance        Extremity/Trunk Assessment   Upper Extremity Assessment Upper Extremity Assessment: Overall WFL for tasks assessed    Lower Extremity Assessment Lower Extremity Assessment: Overall WFL for tasks assessed       Communication   Communication: No difficulties  Cognition Arousal/Alertness: Awake/alert Behavior During Therapy: WFL for tasks assessed/performed Overall Cognitive Status: Within Functional Limits for tasks assessed                                 General Comments: WFL, although impulsive/poor insight into deficits (potentially baseline personality?)      General Comments      Exercises     Assessment/Plan  PT Assessment Patent does not need any further PT services  PT Problem List         PT Treatment Interventions      PT Goals (Current goals can be found in the Care Plan section)  Acute Rehab PT Goals PT Goal Formulation: All assessment and education complete, DC therapy    Frequency     Barriers to discharge        Co-evaluation               AM-PAC PT "6 Clicks" Mobility  Outcome Measure Help needed turning from your back to your side while in a flat bed without using bedrails?: None Help needed moving from lying on your back to sitting on the side of a flat bed without using bedrails?: None Help needed moving to and from a bed to a chair  (including a wheelchair)?: None Help needed standing up from a chair using your arms (e.g., wheelchair or bedside chair)?: None Help needed to walk in hospital room?: None Help needed climbing 3-5 steps with a railing? : None 6 Click Score: 24    End of Session   Activity Tolerance: Patient tolerated treatment well Patient left: in chair;with call bell/phone within reach;with family/visitor present Nurse Communication: Mobility status PT Visit Diagnosis: Other abnormalities of gait and mobility (R26.89)    Time: 9675-9163 PT Time Calculation (min) (ACUTE ONLY): 11 min   Charges:   PT Evaluation $PT Eval Low Complexity: [place], PT, DPT Acute Rehabilitation Services  Pager [phone] Office [place] 12/26/2018, 10:14 AM

## 2018-12-26 NOTE — Plan of Care (Signed)
Discharge education

## 2018-12-26 NOTE — Progress Notes (Signed)
  Echocardiogram 2D Echocardiogram has been performed.  Jeremiah [name] 12/26/2018, 4:17 PM

## 2018-12-26 NOTE — Evaluation (Signed)
Occupational Therapy Evaluation Patient Details Name: Jeremiah [name] MRN: [id number] DOB: Jun 06, 1946 Today's Date: 12/26/2018    History of Present Illness Pt is a 73 y.o. male admitted 12/25/18 with fatigue and SOB. Worked up for CHF exacerbation and symptomatic anemia. PMH includes HF, DM, HTN, prostate CA, afib, CAD s/p CABG, OSA, obesity.   Clinical Impression   Pt is a 73 yo male s/p above dx. Pt PTA: living with spouse and independent with ADL and mobility. Pt currently, limited by SOB with exertion; Pt desats to 78% on EOB on RA and requires 30 secs to recover >90% with pursed lip breathing. Pt performing ADLs and mobility with Modified independence. Pt educated on need to wear O2 at all times. Pt able to tolerate therapy and does not reqire additional OT skilled services.     Follow Up Recommendations  No OT follow up;Supervision - Intermittent    Equipment Recommendations  None recommended by OT    Recommendations for Other Services       Precautions / Restrictions Precautions Precautions: None Restrictions Weight Bearing Restrictions: No      Mobility Bed Mobility Overal bed mobility: Modified Independent             General bed mobility comments: Received sitting in recliner  Transfers Overall transfer level: Independent Equipment used: None             General transfer comment: requires O2    Balance Overall balance assessment: No apparent balance deficits (not formally assessed)                                         ADL either performed or assessed with clinical judgement   ADL Overall ADL's : At baseline                                       General ADL Comments: requires cues to continue to wear O2 with mobility due to desatting.     Vision Baseline Vision/History: Wears glasses Vision Assessment?: No apparent visual deficits     Perception     Praxis      Pertinent Vitals/Pain Pain Assessment:  No/denies pain     Hand Dominance Right   Extremity/Trunk Assessment Upper Extremity Assessment Upper Extremity Assessment: Overall WFL for tasks assessed   Lower Extremity Assessment Lower Extremity Assessment: Overall WFL for tasks assessed   Cervical / Trunk Assessment Cervical / Trunk Assessment: Normal   Communication Communication Communication: No difficulties   Cognition Arousal/Alertness: Awake/alert Behavior During Therapy: WFL for tasks assessed/performed Overall Cognitive Status: Within Functional Limits for tasks assessed                                 General Comments: WFL, although impulsive/poor insight into deficits (potentially baseline personality?)   General Comments  Cues for safety     Exercises     Shoulder Instructions      Home Living Family/patient expects to be discharged to:: Private residence Living Arrangements: Spouse/significant other Available Help at Discharge: Family;Available PRN/intermittently Type of Home: House Home Access: Stairs to enter Entrance Stairs-Number of Steps: 4   Home Layout: Two level Alternate Level Stairs-Number of Steps: 13 Alternate Level Stairs-Rails: Can reach both Bathroom Shower/Tub:  Tub/shower unit;Walk-in shower   Bathroom Toilet: Standard         Additional Comments: Wife works during day      Prior Functioning/Environment Level of Independence: Independent        Comments: Wears O2 in evenings and with strenuous activity        OT Problem List: Decreased activity tolerance      OT Treatment/Interventions:      OT Goals(Current goals can be found in the care plan section)    OT Frequency:     Barriers to D/C:            Co-evaluation              AM-PAC OT "6 Clicks" Daily Activity     Outcome Measure Help from another person eating meals?: None Help from another person taking care of personal grooming?: None Help from another person toileting, which  includes using toliet, bedpan, or urinal?: None Help from another person bathing (including washing, rinsing, drying)?: None Help from another person to put on and taking off regular upper body clothing?: None Help from another person to put on and taking off regular lower body clothing?: None 6 Click Score: 24   End of Session Equipment Utilized During Treatment: Oxygen Nurse Communication: Mobility status  Activity Tolerance: Patient tolerated treatment well Patient left: in chair;with call bell/phone within reach  OT Visit Diagnosis: Unsteadiness on feet (R26.81)                Time: 6067-7034 OT Time Calculation (min): 25 min Charges:  OT General Charges $OT Visit: 1 Visit OT Evaluation $OT Eval Moderate Complexity: 1 Mod  Darryl Nestle) Marsa Aris OTR/L Acute Rehabilitation Services Pager: [phone] Office: [phone]   Fredda Hammed 12/26/2018, 10:56 AM

## 2018-12-26 NOTE — Discharge Instructions (Signed)
You were admitted to the hospital for shortness of breath and were found to have a low blood level. This improved after we gave you a blood transfusion. You heart appeared to be doing well, however we need you to follow-up with your Cardiologist.

## 2018-12-26 NOTE — Consult Note (Signed)
   [facility] Inpatient Consult   12/26/2018  Jeremiah [name] [birth year] [id number]  Patient screened for transition needs and to check if potential [facility] Management services are needed. Patient was hospitalized per MD notes regarding Jeremiah [name] is a 73 y.o. male history of A. fib not anticoagulated, hypertension, hyperlipidemia, CAD status post CABG presenting with fatigue, shortness of breath.  Patient is on 3 L as needed but has been using it for the last day or so.  Patient also has been having trouble sleeping.  States that his legs are more swollen than usual.  On exam, patient appears fluid overloaded.  He is sitting up and he has bibasilar crackles and 1+ edema.  Met with patient and wife regarding services of [facility] Care Management.  Patient endorses [employer] [facility] and his Primary Care Provider is Dr. [name], of [facility].  Patient denies any current post hospital care management needs.  He and his wife states that they were active participants in the [place] [employer] program for exercise and stopped going when it got cold.  Pharmacy is:  [facility]   Patient states no issues with medications and no issues with transportation. Explained post hospital follow up for transition of care will come from the primary care provider's office.  Patient and wife accepted a brochure and 24 hour nurse advise line.  Patient will be assign to General EMMI follow up calls.    For questions contact:   Natividad Brood, RN BSN [facility] Liaison  [phone] business mobile phone Toll free office [phone]

## 2018-12-26 NOTE — Progress Notes (Signed)
Family Medicine Teaching Service Daily Progress Note Intern Pager: [phone]  Patient name: Jeremiah [name] Medical record number: [id number] Date of birth: 1946/01/10 Age: 73 y.o. Gender: male  Primary Care Provider: [name], MD Consultants: none Code Status: Full code  Pt Overview and Major Events to Date:  Hospital Day 1 Admitted: 12/25/2018   Assessment and Plan: Rubel Heckard is a 73 y.o. male who presented with faitgue/weakness, SOB concerning for CHF exacerbation. PMHx is significant for HFrEF 45-50%, DM, HTN, HLD, h/o prostate CA s/p radiation, h/o Afib not on anticoag s/p ablation, morbid obesity, CAD s/p CABG, OSA  Acute on Chronic hypoxic respiratory failure 2/2 CHF exacerbation. Patient presented with fatigue, orthopnea, LE edema and SOB. BNP elevated at 272.2 and noted to have bilateral pleural thickening of CXR. Trop 0.05>.  sinus ekg. Echo in 02/27/17 showed EF45-50%dry weight is 245 lbs. Admit weight 251 lbs. No fever/leukocytosis.  UOP 635ml so far. On 2L [place].  - monitor on telemetry.  - s/p IV lasix 40mg  x1. Consider scheduling.  - strict I/Os, daily wts - f/u echo - trend trops - am EKG - continue home bisoprolol  - consider adding ACEI/ARB later during hospital stay as BP allows - PT/OT  Symptomatic Anemia: Hgb 7.7 (Baseline: 8.0-8.5). Patient has fatigue and lightheadedness, reports similar symptoms to when he has needed transfusions in the past.  overdue for colonoscopy. S/p 1 u pRBC transfusion. Anemia panel: low iron, elevated TIBC, low ferritin.  - anemia panel order prior to transfusion - give additional IV lasix 40mg  x1 post transfusion - monitor CBC - continue home ferrous sulfate  COPD -  Possibly has A1AT deficiency?  Sees Dr. Melvyn Novas.  Only on duonebs PRN.  Has tried lama-laba before and did not feel benefit.  Had wheezing on exam.   - consider starting lama.   CAD s/p CABG, stable - continue home ASA81  DM: At home on metformin 750mg  BID and  glimepiride 1mg  qd. - holding home meds while admitted - f/u a1c - monitor CBGs - mSSI - consider d/c glimepiride Or switching to glipizide   HTN: controlled. BP soft on admit 110/54. Have been consisitently above 110 so far.  - continue lasix and home bisoprolol as per above - consider ARB on outpatient.    HLD: - continue home atorvastatin  Hypothyroidism: At home on levothyroxine 55mcg qd. Last TSH 02/26/17 was 2.448 - continue home med - f/uTSH  OSA: At home on 2L O2 via [place] at night, is noncompliant on home CPAP - Declined CPAP - supplemental oxygen as per above  Electrolytes: PRN  Nutrition: heart healthy diet DVT px: lovenox  Disposition: home   Medications: Scheduled Meds: . aspirin  81 mg Oral Daily  . atorvastatin  80 mg Oral Daily  . bisoprolol  5 mg Oral Daily  . enoxaparin (LOVENOX) injection  40 mg Subcutaneous Q24H  . ferrous sulfate  325 mg Oral Q breakfast  . insulin aspart  0-15 Units Subcutaneous TID WC  . levothyroxine  75 mcg Oral QAC breakfast  . pantoprazole  40 mg Oral Daily  . sodium chloride flush  3 mL Intravenous Q12H   Continuous Infusions: . sodium chloride     PRN Meds: sodium chloride, acetaminophen, ipratropium-albuterol, sodium chloride flush  ================================================= ================================================= Subjective:  Patient feels much better today.  He would like to leave as soon as he can. At home he only needs oxygen at night but was requiring it during the day for the past  few days.    Objective: Temp:  [97.5 F (36.4 C)-98.8 F (37.1 C)] 97.8 F (36.6 C) (01/24 0311) Pulse Rate:  [71-77] 72 (01/24 0311) Resp:  [16-22] 22 (01/24 0311) BP: (110-123)/(54-96) 113/61 (01/24 0311) SpO2:  [83 %-99 %] 92 % (01/24 0311) Weight:  [115.4 kg] 115.4 kg (01/23 2350) Intake/Output 01/23 0701 - 01/24 0700 In: 243 [P.O.:240; I.V.:3] Out: 600 [Urine:600] Physical Exam:  Gen: NAD, alert,  non-toxic, well-nourished, well-appearing, sitting comfortably.   HEENT: Normocephaic, atraumatic. Clear conjuctiva, no scleral icterus and injection.  CV: Regular rate and rhythm.   Radial pulses 2+ bilaterally. Minimal  lower extremity non pitting edema. Resp: slight wheezing bilaterally. Minimal bibasilar crackles.   No increased work of breathing appreciated. On room air. Abd: Nontender and nondistended on palpation to all 4 quadrants.  Positive bowel sounds. Psych: Cooperative with exam. Pleasant. Makes eye contact. Extremities: Full ROM    Laboratory: Recent Labs  Lab 12/25/18 1808  WBC 6.0  HGB 7.7*  HCT 28.4*  PLT 206   Recent Labs  Lab 12/25/18 1808  NA 139  K 3.6  CL 96*  CO2 34*  BUN 22  CREATININE 1.06  CALCIUM 8.9  GLUCOSE 98    Imaging/Diagnostic Tests: Dg Chest 2 View  Result Date: 12/25/2018 CLINICAL DATA:  Shortness of breath for 1 week. Congestive heart failure. Coronary artery disease. EXAM: CHEST - 2 VIEW COMPARISON:  02/25/2017 FINDINGS: Stable mild cardiomegaly. Prior CABG. Stable pulmonary interstitial prominence. Stable mild bilateral pleural thickening. No evidence of acute infiltrate. IMPRESSION: Stable cardiomegaly and pleural thickening.  No acute findings. Electronically Signed   By: Earle Gell M.D.   On: 12/25/2018 17:04      Benay Pike, MD 12/26/2018, 5:57 AM PGY-1, [place] Intern pager: [phone], text pages welcome

## 2018-12-28 NOTE — Discharge Summary (Signed)
[facility] Discharge Summary  Patient name: Jeremiah [name] Medical record number: [id number] Date of birth: 1946/08/24 Age: 73 y.o. Gender: male Date of Admission: 12/25/2018  Date of Discharge: 12/26/2018 Admitting Physician: Bufford Lope, DO  Primary Care Provider: [name], MD Consultants: none  Indication for Hospitalization: dyspnea and fatigue  Discharge Diagnoses/Problem List:  CHF exacerbation Symptomatic anemia COPD CAD DM-II HTN HLD Hypothyroidism OSA  Disposition: home  Discharge Condition: improved, stable  Discharge Exam:  BP (!) 119/54 (BP Location: Right Arm)   Pulse 72   Temp 98 F (36.7 C) (Oral)   Resp (!) 21   Ht 5\' 7"  (1.702 m)   Wt 115.4 kg   SpO2 91%   BMI 39.86 kg/m  Gen: NAD, alert, non-toxic, well-nourished, well-appearing, sitting comfortably.   HEENT: Normocephaic, atraumatic. Clear conjuctiva, no scleral icterus and injection.  CV: Regular rate and rhythm.   Radial pulses 2+ bilaterally. Minimal  lower extremity non pitting edema. Resp: slight wheezing bilaterally. Minimal bibasilar crackles.   No increased work of breathing appreciated. On room air. Abd: Nontender and nondistended on palpation to all 4 quadrants.  Positive bowel sounds. Psych: Cooperative with exam. Pleasant. Makes eye contact. Extremities: Full ROM   Brief Hospital Course:  Patient was admitted for fatigue and dyspnea and was found to have anemia of 7.7 and CHF exacerbation.  BNP was elevated at 272.  He was given lasix one time and one unit of pRBC.The next morning he was feeling much better and stated he was well enough to go home.  He was off supplemental oxygen and sitting in bedside chair.  Echo was performed and showed EF of 92-11% with no systolic dysfunction, but severely dilated left atrium.  Patient has supplemental oxygen at home.    Issues for Follow Up:  1. CHF - patient on bisoprolol and lasix at home.  Consider increasing lasix  dose and adding ARB 2. Anemia - patient anemic with low ferritin.  Get CBC 3. Colonoscopy - patient overdue for colonoscopy.  Important in setting of anemia.   4. COPD - on duonebs at home PRN. Wheezing present on exam. Consider adding a controller medication.  5. Diabetes - consider discontinuing glimepiride given age or swithing to glipizide.   Significant Procedures: blood transufion  Significant Labs and Imaging:  Recent Labs  Lab 12/25/18 1808 12/26/18 0459 12/26/18 1011  WBC 6.0  --  6.8  HGB 7.7* 8.8* 8.9*  HCT 28.4* 30.8* 30.7*  PLT 206  --  197   Recent Labs  Lab 12/25/18 1808 12/26/18 0459  NA 139 140  K 3.6 3.5  CL 96* 95*  CO2 34* 34*  GLUCOSE 98 153*  BUN 22 22  CREATININE 1.06 0.97  CALCIUM 8.9 8.8*    Results/Tests Pending at Time of Discharge: none  Discharge Medications:  Allergies as of 12/26/2018      Reactions   Scallops [shellfish Allergy] Other (See Comments)   Rash; shortness of breath   Fluzone [flu Virus Vaccine] Nausea And Vomiting, Palpitations, Rash      Medication List    STOP taking these medications   Turmeric 500 MG Tabs     TAKE these medications   aspirin 81 MG tablet Take 1 tablet (81 mg total) by mouth daily. Hold for 5 days What changed:  additional instructions   atorvastatin 80 MG tablet Commonly known as:  LIPITOR Take 80 mg by mouth daily.   betamethasone dipropionate 0.05 %  lotion Apply 1 application topically 2 (two) times daily.   bisoprolol 5 MG tablet Commonly known as:  ZEBETA TAKE 1 TABLET(5 MG) BY MOUTH DAILY What changed:  See the new instructions.   ferrous sulfate 325 (65 FE) MG tablet Take 325 mg by mouth daily with breakfast.   furosemide 40 MG tablet Commonly known as:  LASIX TAKE 1 TABLET(40 MG) BY MOUTH DAILY What changed:  See the new instructions.   glimepiride 1 MG tablet Commonly known as:  AMARYL Take 1 tablet by mouth daily.   ipratropium-albuterol 0.5-2.5 (3) MG/3ML  Soln Commonly known as:  DUONEB Take 3 mLs by nebulization every 4 (four) hours as needed.   levothyroxine 75 MCG tablet Commonly known as:  SYNTHROID, LEVOTHROID Take 75 mcg by mouth daily before breakfast.   metFORMIN 500 MG tablet Commonly known as:  GLUCOPHAGE Take 750 mg by mouth 2 (two) times daily with a meal.   OXYGEN 2.5 lpm with sleep only [facility]   pantoprazole 40 MG tablet Commonly known as:  PROTONIX TAKE 1 TABLET(40 MG) BY MOUTH TWICE DAILY BEFORE A MEAL What changed:  See the new instructions.       Discharge Instructions: Please refer to Patient Instructions section of EMR for full details.  Patient was counseled important signs and symptoms that should prompt return to medical care, changes in medications, dietary instructions, activity restrictions, and follow up appointments.   Follow-Up Appointments: Follow-up Information    Call [name], MD.   Specialty:  Family Medicine Why:  Hospital follow-up. Contact information: [address] [place]  [address] [place]. Go on 12/29/2018.   Why:  Hospital follow at 2:00 PM. Contact information: [place] [address] [phone]          Benay Pike, MD 12/28/2018, 9:22 PM PGY-1, [place]

## 2018-12-29 ENCOUNTER — Ambulatory Visit (INDEPENDENT_AMBULATORY_CARE_PROVIDER_SITE_OTHER): Payer: Federal, State, Local not specified - PPO | Admitting: Physician Assistant

## 2018-12-29 ENCOUNTER — Ambulatory Visit: Payer: Federal, State, Local not specified - PPO | Admitting: Orthotics

## 2018-12-29 ENCOUNTER — Encounter: Payer: Self-pay | Admitting: Physician Assistant

## 2018-12-29 VITALS — BP 118/68 | HR 74 | Ht 67.0 in | Wt 258.2 lb

## 2018-12-29 DIAGNOSIS — M7750 Other enthesopathy of unspecified foot: Secondary | ICD-10-CM

## 2018-12-29 DIAGNOSIS — D649 Anemia, unspecified: Secondary | ICD-10-CM | POA: Diagnosis not present

## 2018-12-29 DIAGNOSIS — R0602 Shortness of breath: Secondary | ICD-10-CM

## 2018-12-29 DIAGNOSIS — I5033 Acute on chronic diastolic (congestive) heart failure: Secondary | ICD-10-CM | POA: Diagnosis not present

## 2018-12-29 DIAGNOSIS — I10 Essential (primary) hypertension: Secondary | ICD-10-CM

## 2018-12-29 DIAGNOSIS — E785 Hyperlipidemia, unspecified: Secondary | ICD-10-CM

## 2018-12-29 DIAGNOSIS — I5032 Chronic diastolic (congestive) heart failure: Secondary | ICD-10-CM | POA: Diagnosis not present

## 2018-12-29 DIAGNOSIS — E039 Hypothyroidism, unspecified: Secondary | ICD-10-CM

## 2018-12-29 DIAGNOSIS — I25810 Atherosclerosis of coronary artery bypass graft(s) without angina pectoris: Secondary | ICD-10-CM | POA: Diagnosis not present

## 2018-12-29 LAB — BPAM RBC
BLOOD PRODUCT EXPIRATION DATE: 202002242359
Blood Product Expiration Date: 202002242359
ISSUE DATE / TIME: 202001240044
UNIT TYPE AND RH: 5100
Unit Type and Rh: 5100

## 2018-12-29 LAB — TYPE AND SCREEN
ABO/RH(D): O POS
Antibody Screen: POSITIVE
Donor AG Type: NEGATIVE
Donor AG Type: NEGATIVE
UNIT DIVISION: 0
Unit division: 0

## 2018-12-29 MED ORDER — FERROUS SULFATE 325 (65 FE) MG PO TABS: 325.0000 mg | ORAL_TABLET | Freq: Every day | ORAL | 1 refills | Status: DC

## 2018-12-29 MED ORDER — POTASSIUM CHLORIDE CRYS ER 20 MEQ PO TBCR: 20.0000 meq | EXTENDED_RELEASE_TABLET | Freq: Two times a day (BID) | ORAL | 11 refills | Status: DC

## 2018-12-29 MED ORDER — FUROSEMIDE 40 MG PO TABS: 40.0000 mg | ORAL_TABLET | Freq: Two times a day (BID) | ORAL | 11 refills | Status: DC

## 2018-12-29 NOTE — Progress Notes (Signed)
Patient came in today to pick up custom made foot orthotics.  The goals were accomplished and the patient reported no dissatisfaction with said orthotics.  Patient was advised of breakin period and how to report any issues. 

## 2018-12-29 NOTE — Progress Notes (Signed)
Cardiology Office Note    Date:  12/31/2018   ID:  Jeremiah [name], DOB [birth year], MRN [id number]  PCP:  [name], MD  Cardiologist:  Dr. Debara Pickett / Dr. Curt Bears  Chief Complaint  Patient presents with  . Follow-up    seen for Dr. Debara Pickett    History of Present Illness:  Jeremiah [name] is a 73 y.o. 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 He had PMH of HTN, HLD, hypothyroidism, COPD, OSA, prostate CA, and CAD s/p CABG LIMA to LAD, SVG to OM1, and SVG to RPDA in [place] in 2006. In 2015, he underwent Myoview which showed possible reversible ischemia. He was referred for cardiac catheterization at [facility] [place] [facility] [place]. This demonstrated patent 3 bypass graft with multivessel coronary artery disease. He was having palpitation the time and monitor showed PSVT. He was scheduled for possible ablation but this was delayed as he was dealing with ankle fracture and a nonhealing wound. He had a sleep study done by [facility] physicians, this demonstrated severe obstructive sleep apnea and BiPAP was recommended. Last echocardiogram in [place] was in 2015 showing EF 58% and no significant valvular disease. Last cardiac catheterization was performed on 05/08/2016, and this showed 85% left main lesion, 100% ostial OM1, 100% ostial RCA 100% mid RCA lesion, 100% proximal LAD lesion, patent SVG to PDA with 40% proximal stenosis, patent SVG to left circumflex, patent LIMA to LAD. He had AVNRT ablation on 08/22/2016. Despite atrial fibrillation is listed under his medical history list, I have not seen any evidence or previous documented atrial fibrillation.   Patient was admitted multiple times in 2018 with weakness and found to have significant anemia and mild CHF.  Echocardiogram obtained on 02/19/2017 showed EF 45 to 50%, mild LVH, moderate focal basal hypertrophy of the septum, mid to distal anterior, anteroapical, inferior wall hypokinesis, mild aortic stenosis, mild MR, PA peak pressure 50  mmHg.  Patient was last seen by Dr. Debara Pickett in September 2019, at which time he was doing well.  Carotid Doppler obtained in October 2019 showed mild disease bilaterally.  More recently, patient was admitted to hospital in January 2020 for fatigue and dyspnea.  He was found to have anemia with hemoglobin of 7.7 and CHF exacerbation.  BNP was elevated at 272.  He was given IV Lasix and had 1 unit of packed red blood cell transfusion.  Echocardiogram was repeated which showed EF of 60 to 65% severe LAE.  He was discharged on the same dose of Lasix at 40 mg daily.  His discharge weight was 254 pounds.   Patient presents today for cardiology office visit.  He temporarily came off of oxygen this morning and his O2 saturation went down to 68%.  While in the clinic, his O2 saturation was initially 89% but later came up to 92% on 4 L oxygen.  He will need 4 L oxygen 24/7.  He is weight has also increased to 258.2 pounds today.  He has trace amount of lower extremity edema.  He is chronic dyspnea is likely a combination of underlying lung issue, anemia, diastolic heart failure and obesity.  Out of the four, diastolic heart failure likely playing a small role in the degree of shortness of breath.  I still plan to rechallenge him with a higher dose of diuretic.  I will increase his Lasix to 40 mg twice daily and add potassium chloride 20 mEq twice daily.  He will need to  be seen by Dr. Melvyn Novas of pulmonology in the Dr. Benson Norway of GI service early.  I will obtain a CBC and d-dimer today.  I plan to have the patient return in 1 week for follow-up with basic metabolic panel and office visit.  I also plan to order a 10 L oxygen tank for the patient as he require 4 L of oxygen continuously with his current condition.   Past Medical History:  Diagnosis Date  . A-fib ([place])   . CAD (coronary artery disease)    a. 2006: CABG in 2006 with LIMA-LAD, SVG-OM1, and SVG-RPDA  . Chronic pain 03/21/2016  . Essential hypertension 03/21/2016    . History of prostate cancer 03/21/2016  . HLD (hyperlipidemia)   . HTN (hypertension)   . Hx of CABG 2006  . Hypercholesteremia 03/21/2016  . Hypothyroidism   . Medication management 03/21/2016  . Morbid obesity ([place]) 03/21/2016  . Myocardial infarct ([place])   . OSA (obstructive sleep apnea) 03/21/2016  . Pain in right ankle and joints of right foot 03/21/2016  . Paronychia 03/21/2016  . Primary insomnia 03/21/2016  . Prostate cancer ([place]) 2008  . Tobacco dependence 03/21/2016    Past Surgical History:  Procedure Laterality Date  . ANKLE SURGERY  12/2013  . CARDIAC CATHETERIZATION N/A 05/08/2016   Procedure: Left Heart Cath and Cors/Grafts Angiography;  Surgeon: Belva Crome, MD;  Location: [facility];  Service: Cardiovascular;  Laterality: N/A;  . CORONARY ARTERY BYPASS GRAFT  2006   x3  . ELECTROPHYSIOLOGIC STUDY N/A 08/24/2016   Procedure: SVT Ablation;  Surgeon: Will Meredith Leeds, MD;  Location: [facility] [facility];  Service: Cardiovascular;  Laterality: N/A;  . PROSTATE SURGERY  2008  . PTCA      Current Medications: Outpatient Medications Prior to Visit  Medication Sig Dispense Refill  . aspirin EC 81 MG tablet Take 81 mg by mouth daily.    Marland Kitchen atorvastatin (LIPITOR) 80 MG tablet Take 80 mg by mouth daily.    . betamethasone dipropionate 0.05 % lotion Apply 1 application topically 2 (two) times daily.     . bisoprolol (ZEBETA) 5 MG tablet Take 5 mg by mouth daily.    Marland Kitchen glimepiride (AMARYL) 1 MG tablet Take 1 tablet by mouth daily.    Marland Kitchen ipratropium-albuterol (DUONEB) 0.5-2.5 (3) MG/3ML SOLN Take 3 mLs by nebulization every 4 (four) hours as needed. 360 mL 0  . levothyroxine (SYNTHROID, LEVOTHROID) 75 MCG tablet Take 75 mcg by mouth daily before breakfast.    . levothyroxine (SYNTHROID, LEVOTHROID) 88 MCG tablet Take 88 mcg by mouth daily before breakfast.    . metFORMIN (GLUCOPHAGE) 500 MG tablet Take 750 mg by mouth 2 (two) times daily with a meal.     . OXYGEN 2.5 lpm  with sleep only AHC    . pantoprazole (PROTONIX) 40 MG tablet Take 40 mg by mouth daily.    . traMADol (ULTRAM) 50 MG tablet Take by mouth every 6 (six) hours as needed for moderate pain.    . ferrous sulfate 325 (65 FE) MG tablet Take 325 mg by mouth daily with breakfast.    . furosemide (LASIX) 40 MG tablet Take 40 mg by mouth.    Marland Kitchen aspirin 81 MG tablet Take 1 tablet (81 mg total) by mouth daily. Hold for 5 days (Patient taking differently: Take 81 mg by mouth daily. ) 30 tablet   . bisoprolol (ZEBETA) 5 MG tablet TAKE 1 TABLET(5 MG) BY MOUTH  DAILY (Patient taking differently: Take 5 mg by mouth daily. ) 60 tablet 5  . furosemide (LASIX) 40 MG tablet TAKE 1 TABLET(40 MG) BY MOUTH DAILY (Patient taking differently: Take 40 mg by mouth daily. ) 90 tablet 3  . pantoprazole (PROTONIX) 40 MG tablet TAKE 1 TABLET(40 MG) BY MOUTH TWICE DAILY BEFORE A MEAL (Patient taking differently: Take 40 mg by mouth daily. ) 60 tablet 2   No facility-administered medications prior to visit.      Allergies:   Scallops [shellfish allergy] and Fluzone [flu virus vaccine]   Social History   Socioeconomic History  . Marital status: Married    Spouse name: Not on file  . Number of children: 0  . Years of education: Not on file  . Highest education level: Not on file  Occupational History  . Occupation: retired - Passenger transport manager  Social Needs  . Financial resource strain: Not on file  . Food insecurity:    Worry: Not on file    Inability: Not on file  . Transportation needs:    Medical: Not on file    Non-medical: Not on file  Tobacco Use  . Smoking status: Former Smoker    Packs/day: 0.50    Years: 60.00    Pack years: 30.00    Types: Cigarettes    Last attempt to quit: 12/15/2016    Years since quitting: 2.0  . Smokeless tobacco: Never Used  Substance and Sexual Activity  . Alcohol use: Yes    Comment: very seldom  . Drug use: No  . Sexual activity: Not on file  Lifestyle  . Physical activity:      Days per week: Not on file    Minutes per session: Not on file  . Stress: Not on file  Relationships  . Social connections:    Talks on phone: Not on file    Gets together: Not on file    Attends religious service: Not on file    Active member of club or organization: Not on file    Attends meetings of clubs or organizations: Not on file    Relationship status: Not on file  Other Topics Concern  . Not on file  Social History Narrative   Retired, married, no children      epworth sleepiness scale = 9 (04/13/16) (has OSA)     Family History:  The patient's family history includes Dementia in his mother; Diabetes in his sister.   ROS:   Please see the history of present illness.    ROS All other systems reviewed and are negative.   PHYSICAL EXAM:   VS:  BP 118/68   Pulse 74   Ht _0  (1.702 m)   Wt 258 lb 3.2 oz (117.1 kg)   SpO2 (!) 89%   BMI 40.44 kg/m    GEN: Well nourished, well developed, in no acute distress  HEENT: normal  Neck: no JVD, carotid bruits, or masses Cardiac: RRR; no murmurs, rubs, or gallops. 1+ pitting edema  Respiratory:  clear to auscultation bilaterally, normal work of breathing GI: soft, nontender, nondistended, + BS MS: no deformity or atrophy  Skin: warm and dry, no rash Neuro:  Alert and Oriented x 3, Strength and sensation are intact Psych: euthymic mood, full affect  Wt Readings from Last 3 Encounters:  12/29/18 258 lb 3.2 oz (117.1 kg)  12/25/18 254 lb 8 oz (115.4 kg)  10/28/18 251 lb (113.9 kg)      Studies/Labs  Reviewed:   EKG:  EKG is not ordered today.    Recent Labs: 12/25/2018: B Natriuretic Peptide 272.2 12/26/2018: BUN 22; Creatinine, Ser 0.97; Potassium 3.5; Sodium 140; TSH 2.890 12/29/2018: Hemoglobin 8.2; Platelets 208   Lipid Panel No results found for: CHOL, TRIG, HDL, CHOLHDL, VLDL, LDLCALC, LDLDIRECT  Additional studies/ records that were reviewed today include:   Carotid Doppler 09/10/2018    Summary: Right Carotid: Velocities in the right ICA are consistent with a 1-39% stenosis.                Non-hemodynamically significant plaque <50% noted in the CCA.  Left Carotid: Velocities in the left ICA are consistent with a 1-39% stenosis.               Non-hemodynamically significant plaque noted in the CCA.  Vertebrals:  Bilateral vertebral arteries demonstrate antegrade flow. Subclavians: Normal flow hemodynamics were seen in bilateral subclavian              arteries.   Echo 12/26/2018 LV EF: 60% -   65% Study Conclusions  - Left ventricle: The cavity size was normal. Wall thickness was   increased in a pattern of mild LVH. There was mild focal basal   hypertrophy of the septum. Systolic function was normal. The   estimated ejection fraction was in the range of 60% to 65%. Wall   motion was normal; there were no regional wall motion   abnormalities. Doppler parameters are consistent with high   ventricular filling pressure. - Aortic valve: Noncoronary cusp mobility was mildly restricted. - Mitral valve: Severely calcified annulus. The findings are   consistent with mild stenosis. Valve area by pressure half-time:   2.09 cm^2. - Left atrium: The atrium was severely dilated. - Pulmonary arteries: Systolic pressure was moderately increased.   PA peak pressure: 49 mm Hg (S).  Impressions:  - Definity used; normal LV systolic function; mild LVH; elevated LV   filling pressure; sclerotic aortic valve; severe MAC with mild MS   (mean gradient 5 mmHg); severe LAE; trace TR with moderate   pulmonary hypertension.   ASSESSMENT:    1. SOB (shortness of breath)   2. Chronic diastolic heart failure ([place])   3. Anemia, unspecified type   4. Coronary artery disease involving coronary bypass graft of native heart without angina pectoris   5. Hyperlipidemia LDL goal <70   6. Essential hypertension   7. Hypothyroidism, unspecified type      PLAN:  In order of  problems listed above:  1. Shortness of breath with chronic respiratory failure: Currently on oxygen at 4 L/min.  Family requested 10 L oxygen tank at home which we will try to arrange.  His chronic respiratory failure is likely due to combination of underlying pulmonary issue, anemia, chronic diastolic heart failure and obesity.  He is only mildly volume overloaded on physical exam, I will attempt to challenge him with a higher dose of Lasix at 40 mg twice daily with added potassium supplement.  Although this likely will not completely resolve his issue as I think his pulmonary issue is a major factor.  Recent pulmonary function test shows significantly low FEV1 and FVC percentage.  Given the recent admission, he will need early follow-up with both pulmonology and GI service.  2. Chronic diastolic heart failure: Only mildly volume overloaded on physical exam, I did increase his Lasix to 40 mg twice daily and start him on potassium supplement to see if this will improve  his symptoms.    3. Anemia: Follow-up with Dr. Benson Norway as outpatient.  Patient had a similar presentation in 2018 as well.  There was some thoughts of capsule endoscopy back then, however this was not done as his anemia improved afterward.  4. CAD s/p CABG: Denies any recent anginal symptom   5. hypertension: Blood pressure stable on current therapy  6. Hyperlipidemia: On Lipitor 80 mg daily    Medication Adjustments/Labs and Tests Ordered: Current medicines are reviewed at length with the patient today.  Concerns regarding medicines are outlined above.  Medication changes, Labs and Tests ordered today are listed in the Patient Instructions below. Patient Instructions  Medication Instructions:  Your physician has recommended you make the following change in your medication:  1.  INCREASE the Lasix to 40 mg taking 1 tablet twice a day 2.  START Potassium 20 meq taking 1 tablet twice a day   If you need a refill on your cardiac  medications before your next appointment, please call your pharmacy.   Lab work: TODAY:  CBC & STAT DDIMER 1 WEEK: (AT TIME OF APPT)  BMET  If you have labs (blood work) drawn today and your tests are completely normal, you will receive your results only by: Marland Kitchen MyChart Message (if you have MyChart) OR . A paper copy in the mail If you have any lab test that is abnormal or we need to change your treatment, we will call you to review the results.  Testing/Procedures: None ordered  Follow-Up: Your physician recommends that you schedule a follow-up appointment in: 01-06-2019 ARRIVE AT 2:45 TO SEE Vencent Hauschild, PA-C AT 3:00  Any Other Special Instructions Will Be Listed Below (If Applicable).  You need to contact your GI Dr, Dr. Benson Norway and your Pulmonologist, Dr. Melvyn Novas and make an appt to see them within 1-2 weeks.     Hilbert Corrigan, [place]  12/31/2018 11:47 PM    [facility] [address], [place], [place]  [address] Phone: [phone]; Fax: [phone]

## 2018-12-29 NOTE — Patient Instructions (Addendum)
Medication Instructions:  Your physician has recommended you make the following change in your medication:  1.  INCREASE the Lasix to 40 mg taking 1 tablet twice a day 2.  START Potassium 20 meq taking 1 tablet twice a day   If you need a refill on your cardiac medications before your next appointment, please call your pharmacy.   Lab work: TODAY:  CBC & STAT DDIMER 1 WEEK: (AT TIME OF APPT)  BMET  If you have labs (blood work) drawn today and your tests are completely normal, you will receive your results only by: Marland Kitchen MyChart Message (if you have MyChart) OR . A paper copy in the mail If you have any lab test that is abnormal or we need to change your treatment, we will call you to review the results.  Testing/Procedures: None ordered  Follow-Up: Your physician recommends that you schedule a follow-up appointment in: 01-06-2019 ARRIVE AT 2:45 TO SEE HAO MENG, PA-C AT 3:00  Any Other Special Instructions Will Be Listed Below (If Applicable).  You need to contact your GI Dr, Dr. Benson Norway and your Pulmonologist, Dr. Melvyn Novas and make an appt to see them within 1-2 weeks.

## 2018-12-30 ENCOUNTER — Other Ambulatory Visit: Payer: Federal, State, Local not specified - PPO | Admitting: Orthotics

## 2018-12-30 ENCOUNTER — Telehealth: Payer: Self-pay

## 2018-12-30 DIAGNOSIS — K921 Melena: Secondary | ICD-10-CM

## 2018-12-30 DIAGNOSIS — R7989 Other specified abnormal findings of blood chemistry: Secondary | ICD-10-CM

## 2018-12-30 DIAGNOSIS — A0472 Enterocolitis due to Clostridium difficile, not specified as recurrent: Secondary | ICD-10-CM

## 2018-12-30 LAB — CBC
HEMOGLOBIN: 8.2 g/dL — AB (ref 13.0–17.7)
Hematocrit: 27 % — ABNORMAL LOW (ref 37.5–51.0)
MCH: 25.3 pg — ABNORMAL LOW (ref 26.6–33.0)
MCHC: 30.4 g/dL — ABNORMAL LOW (ref 31.5–35.7)
MCV: 83 fL (ref 79–97)
Platelets: 208 10*3/uL (ref 150–450)
RBC: 3.24 x10E6/uL — ABNORMAL LOW (ref 4.14–5.80)
RDW: 17.1 % — ABNORMAL HIGH (ref 11.6–15.4)
WBC: 5.9 10*3/uL (ref 3.4–10.8)

## 2018-12-30 LAB — D-DIMER, QUANTITATIVE (NOT AT ARMC): D-DIMER: 0.8 mg{FEU}/L — AB (ref 0.00–0.49)

## 2018-12-30 NOTE — Telephone Encounter (Signed)
-----   Message from [place], [place] sent at 12/30/2018  8:16 AM EST ----- Hemoglobin 8.2, slightly down from 8.9 which was the discharge hemoglobin. Need outpatient GI visit with Dr. Benson Norway as recommended yesterday. D-dimer elevated, recommend CTA of chest with PE protocol

## 2018-12-30 NOTE — Telephone Encounter (Signed)
Left message for patient to contact office.

## 2018-12-31 ENCOUNTER — Encounter: Payer: Self-pay | Admitting: Physician Assistant

## 2018-12-31 DIAGNOSIS — I10 Essential (primary) hypertension: Secondary | ICD-10-CM | POA: Diagnosis not present

## 2018-12-31 DIAGNOSIS — I5020 Unspecified systolic (congestive) heart failure: Secondary | ICD-10-CM | POA: Diagnosis not present

## 2018-12-31 DIAGNOSIS — D509 Iron deficiency anemia, unspecified: Secondary | ICD-10-CM | POA: Diagnosis not present

## 2018-12-31 DIAGNOSIS — I5022 Chronic systolic (congestive) heart failure: Secondary | ICD-10-CM | POA: Diagnosis not present

## 2018-12-31 DIAGNOSIS — J449 Chronic obstructive pulmonary disease, unspecified: Secondary | ICD-10-CM | POA: Diagnosis not present

## 2018-12-31 DIAGNOSIS — I4891 Unspecified atrial fibrillation: Secondary | ICD-10-CM | POA: Diagnosis not present

## 2019-01-01 ENCOUNTER — Inpatient Hospital Stay: Payer: Federal, State, Local not specified - PPO | Admitting: Internal Medicine

## 2019-01-01 ENCOUNTER — Ambulatory Visit (HOSPITAL_COMMUNITY)
Admission: RE | Admit: 2019-01-01 | Discharge: 2019-01-01 | Disposition: A | Discharge status: Home / Self Care | Payer: Federal, State, Local not specified - PPO | Source: Ambulatory Visit | Attending: Physician Assistant | Admitting: Physician Assistant

## 2019-01-01 ENCOUNTER — Encounter: Payer: Self-pay | Admitting: Internal Medicine

## 2019-01-01 ENCOUNTER — Encounter: Payer: Self-pay | Admitting: *Deleted

## 2019-01-01 ENCOUNTER — Ambulatory Visit (INDEPENDENT_AMBULATORY_CARE_PROVIDER_SITE_OTHER): Payer: Federal, State, Local not specified - PPO | Admitting: Internal Medicine

## 2019-01-01 VITALS — BP 124/74 | HR 102 | Ht 67.0 in | Wt 255.0 lb

## 2019-01-01 DIAGNOSIS — J9611 Chronic respiratory failure with hypoxia: Secondary | ICD-10-CM | POA: Diagnosis not present

## 2019-01-01 DIAGNOSIS — R7989 Other specified abnormal findings of blood chemistry: Secondary | ICD-10-CM | POA: Insufficient documentation

## 2019-01-01 DIAGNOSIS — R0600 Dyspnea, unspecified: Secondary | ICD-10-CM

## 2019-01-01 DIAGNOSIS — R0609 Other forms of dyspnea: Secondary | ICD-10-CM | POA: Diagnosis not present

## 2019-01-01 DIAGNOSIS — J449 Chronic obstructive pulmonary disease, unspecified: Secondary | ICD-10-CM

## 2019-01-01 DIAGNOSIS — J9612 Chronic respiratory failure with hypercapnia: Secondary | ICD-10-CM

## 2019-01-01 DIAGNOSIS — R0602 Shortness of breath: Secondary | ICD-10-CM | POA: Diagnosis not present

## 2019-01-01 MED ORDER — FORMOTEROL FUMARATE 20 MCG/2ML IN NEBU: 20.0000 ug | INHALATION_SOLUTION | Freq: Two times a day (BID) | RESPIRATORY_TRACT | 0 refills | Status: DC

## 2019-01-01 MED ORDER — IOPAMIDOL (ISOVUE-370) INJECTION 76%
100.0000 mL | Freq: Once | INTRAVENOUS | Status: AC | PRN
  Administered 2019-01-01: 100 mL via INTRAVENOUS

## 2019-01-01 MED ORDER — SODIUM CHLORIDE (PF) 0.9 % IJ SOLN
INTRAMUSCULAR | Status: AC
  Filled 2019-01-01: qty 50

## 2019-01-01 MED ORDER — REVEFENACIN 175 MCG/3ML IN SOLN: 1.0000 | Freq: Every day | RESPIRATORY_TRACT | 0 refills | Status: DC

## 2019-01-01 MED ORDER — IOPAMIDOL (ISOVUE-370) INJECTION 76%
INTRAVENOUS | Status: AC
  Filled 2019-01-01: qty 100

## 2019-01-01 MED ORDER — METHYLPREDNISOLONE ACETATE 80 MG/ML IJ SUSP
120.0000 mg | Freq: Once | INTRAMUSCULAR | Status: AC
  Administered 2019-01-01: 120 mg via INTRAMUSCULAR

## 2019-01-01 MED ORDER — ALBUTEROL SULFATE (2.5 MG/3ML) 0.083% IN NEBU: 2.5000 mg | INHALATION_SOLUTION | RESPIRATORY_TRACT | 12 refills | Status: DC | PRN

## 2019-01-01 NOTE — Assessment & Plan Note (Signed)
DDX of  difficult airways management almost all start with A and  include Adherence, Ace Inhibitors, Acid Reflux, Active Sinus Disease, Alpha 1 Antitripsin deficiency, Anxiety masquerading as Airways dz,  ABPA,  Allergy(esp in young), Aspiration (esp in elderly), Adverse effects of meds,  Active smoking or vaping, A bunch of PE's (a small clot burden can't cause this syndrome unless there is already severe underlying pulm or vascular dz with poor reserve) plus two Bs  = Bronchiectasis and Beta blocker use..and one C= CHF  Adherence is always the initial "prime suspect" and is a multilayered concern that requires a "trust but verify" approach in every patient - starting with knowing how to use medications, especially inhalers, correctly, keeping up with refills and understanding the fundamental difference between maintenance and prns vs those medications only taken for a very short course and then stopped and not refilled.   ? Acid (or non-acid) GERD > always difficult to exclude as up to 75% of pts in some series report no assoc GI/ Heartburn symptoms> rec continue max (24h)  acid suppression and diet restrictions/ reviewed     ? Allergy/ asthma > depomedrol 120 mg IM  ? Anemia>  Def correlation and note when he's up to hgb 12 he feels much better > defer to PCP/ GI to address  ? A  Bunch of PE's > neg CTa today excludes  ? CHF > cards w/u in progress but clearly strained by tendency to hypoxemia/anemia is setting of morbid obesity

## 2019-01-01 NOTE — Assessment & Plan Note (Signed)
ERV 20% by PFT's 10/28/2018   Body mass index is 39.94 kg/m.  -  trending up Lab Results  Component Value Date   TSH 2.890 12/26/2018     Contributing to gerd risk/ doe/reviewed the need and the process to achieve and maintain neg calorie balance > defer f/u primary care including intermittently monitoring thyroid status      I had an extended discussion with the patient/wife  reviewing all relevant studies completed to date and  lasting 25 minutes of a 40  minute office visit adressing severe non-specific but potentially very serious refractory respiratory symptoms of uncertain and potentially multiple  etiologies.   Each maintenance medication was reviewed in detail including most importantly the difference between maintenance and prns and under what circumstances the prns are to be triggered using an action plan format that is not reflected in the computer generated alphabetically organized AVS.    Please see AVS for specific instructions unique to this office visit that I personally wrote and verbalized to the the pt in detail and then reviewed with pt  by my nurse highlighting any changes in therapy/plan of care  recommended at today's visit.

## 2019-01-01 NOTE — Patient Instructions (Addendum)
Plan A = Automatic = performist 20 mcg twice daily in neb with yupelri 175 mcg each am   Plan B = Backup Only use your albuterol nebulizer as a rescue medication to be used if you can't catch your breath by resting or doing a relaxed purse lip breathing pattern.  - The less you use it, the better it will work when you need it. - Ok to use  up to 1  every 4 hours if you must but call for appointment if use goes up over your usual need  Goal for 02 is to keep sats above 90% at all times so may be able to adjust back down to 2lpm at rest to save on protable tank  and turn up with walking to whatever setting needed to reach this goal.   Please schedule a follow up office visit in 4 weeks, sooner if needed  with all medications /inhalers/ solutions in hand so we can verify exactly what you are taking. This includes all medications from all doctors and over the counters  - PFTs on return

## 2019-01-01 NOTE — Progress Notes (Signed)
Subjective:     Patient ID: Jeremiah [name], male   DOB: 07/03/1946,    MRN: [id number]     Brief patient profile:  86 yowm quit smoking 12/15/16 with onset of doe x 2008 and GOLD III criteria confirmed 10/28/2018 with restictive component due to obesity / prev eval for osa but could not tol cpap.    History of Present Illness   02/12/2017 1st [facility] office visit/ Rolinda Impson  Re ? Copd  Chief Complaint  Patient presents with  . Pulmonary Consult    Referred by Dr. Dorthy Cooler. Pt c/o SOB for the past 10 yrs. He gets SOB when running and working "which I don't do anymore".   MMRC2 = can't walk a nl pace on a flat grade s sob but does fine slow and flat eg shopping / limited also by R chronic ankle pain p fx rec Stop lopressor (metaprolol) and start bisoprolol 5 mg twice daily  If face/eye symptoms  worse or fever go back to ER  Please schedule a follow up office visit in 4 weeks, sooner if needed with pfts on return    04/01/2017  f/u ov/Kain Milosevic re: copd gold III/ 02 2lpm  Chief Complaint  Patient presents with  . Follow-up    Breathing is doing well. No new co's today.    Goal is to walk dogs beyond the end of property line but can't presently due to sob   rec Protonix Take 30- 60 min before your first and last meals of the day  GERD diet  Please schedule a follow up office visit in 4 weeks, sooner if needed  with all medications /inhalers/ solutions in hand so we can verify exactly what you are taking. This includes all medications from all doctors and over the counters    05/02/2017  f/u ov/Sadira Standard re: COPD GOLD III / did not bring meds as requested but maint on duoneb only  Chief Complaint  Patient presents with  . Follow-up    Pt states "I still sound like a frog and I still snore"- no new co's today. He is requesting order for POC for travel. He only uses o2 with sleep. He also requestes humidity to be added to his o2.   sleeps fine on 2lpm s am ha / not consistent with amb 02  Doe =  MMRC2 = can't walk a nl pace on a flat grade s sob but does fine slow and flat on 02 and wants POC rec 02 2lpm at bedtime with humidity which we will arrange  Please see patient coordinator before you leave today  to schedule ambulatory 02 titration to see if eligible for POC          10/28/2018  f/u ov/Heike Pounds re: GOLD III copd / no maint rx (bevespi didn't help) Chief Complaint  Patient presents with  . Follow-up    Breathing has improved. He rarely uses his albuterol neb and does not have an albuterol inhaler.   Dyspnea:  Not limited by breathing from desired activities s using of inhalers / more limited by ankles esp on R   Cough: min daytime only, non-productive  Sleeping: flat ok SABA use: none 02: 2lpm hs only    rec Weight control rx      Date of Admission: 12/25/2018                      Date of Discharge: 12/26/2018   Discharge Diagnoses/Problem List:  CHF  exacerbation Symptomatic anemia COPD CAD DM-II HTN HLD Hypothyroidism OSA   Brief Hospital Course:  Patient was admitted for fatigue and dyspnea and was found to have anemia of 7.7 and CHF exacerbation.  BNP was elevated at 272.  He was given lasix one time and one unit of pRBC.The next morning he was feeling much better and stated he was well enough to go home.  He was off supplemental oxygen and sitting in bedside chair.  Echo was performed and showed EF of 69-62% with no systolic dysfunction, but severely dilated left atrium.  Patient has supplemental oxygen at home.      Issues for Follow Up:  1. CHF - patient on bisoprolol and lasix at home.  Consider increasing lasix dose and adding ARB 2. Anemia - patient anemic with low ferritin.  Get CBC 3. Colonoscopy - patient overdue for colonoscopy.  Important in setting of anemia.   4. COPD - on duonebs at home PRN. Wheezing present on exam. Consider adding a controller medication.  5. Diabetes - consider discontinuing glimepiride given      01/01/2019   Extended post hosp f/u  ov/Teja Costen re: copd/ chf/ anemia - not on maint rx, just duoneb prn  Chief Complaint  Patient presents with  . Hospitalization Follow-up    Breathing has improved. He is now on 4lpm o2 24/7. CT Angiogram done today.    Dyspnea:  MMRC4  = sob if tries to leave home or while getting dressed  Even on 02 but ok at rest sitting  Cough: min dry  Sleeping: 45 degrees  SABA use: duoneb helps the most 02: 4lpm 24 /7    No obvious day to day or daytime variability or assoc excess/ purulent sputum or mucus plugs or hemoptysis or cp or chest tightness, subjective wheeze or overt sinus or hb symptoms.    . Also denies any obvious fluctuation of symptoms with weather or environmental changes or other aggravating or alleviating factors except as outlined above   No unusual exposure hx or h/o childhood pna/ asthma or knowledge of premature birth.  Current Allergies, Complete Past Medical History, Past Surgical History, Family History, and Social History were reviewed in [employer] record.  ROS  The following are not active complaints unless bolded Hoarseness, sore throat, dysphagia, dental problems, itching, sneezing,  nasal congestion or discharge of excess mucus or purulent secretions, ear ache,   fever, chills, sweats, unintended wt loss or wt gain, classically pleuritic or exertional cp,  orthopnea pnd or arm/hand swelling  or leg swelling, presyncope, palpitations, abdominal pain, anorexia, nausea, vomiting, diarrhea  or change in bowel habits or change in bladder habits, change in stools or change in urine, dysuria, hematuria,  rash, arthralgias, visual complaints, headache, numbness, weakness or ataxia or problems with walking or coordination,  change in mood or  memory.        Current Meds  Medication Sig  . aspirin EC 81 MG tablet Take 81 mg by mouth daily.  Marland Kitchen atorvastatin (LIPITOR) 80 MG tablet Take 80 mg by mouth daily.  . betamethasone dipropionate  0.05 % lotion Apply 1 application topically 2 (two) times daily.   . bisoprolol (ZEBETA) 5 MG tablet Take 5 mg by mouth daily.  . ferrous sulfate 325 (65 FE) MG tablet Take 1 tablet (325 mg total) by mouth daily with breakfast.  . furosemide (LASIX) 40 MG tablet Take 1 tablet (40 mg total) by mouth 2 (two) times daily.  Marland Kitchen  glimepiride (AMARYL) 1 MG tablet Take 1 tablet by mouth daily.  Marland Kitchen levothyroxine (SYNTHROID, LEVOTHROID) 75 MCG tablet Take 75 mcg by mouth daily before breakfast.  . levothyroxine (SYNTHROID, LEVOTHROID) 88 MCG tablet Take 88 mcg by mouth daily before breakfast.  . metFORMIN (GLUCOPHAGE) 500 MG tablet Take 750 mg by mouth 2 (two) times daily with a meal.   . OXYGEN 4 lpm with 24/7 AHC  . pantoprazole (PROTONIX) 40 MG tablet Take 40 mg by mouth daily.  . potassium chloride SA (K-DUR,KLOR-CON) 20 MEQ tablet Take 1 tablet (20 mEq total) by mouth 2 (two) times daily.  . traMADol (ULTRAM) 50 MG tablet Take by mouth every 6 (six) hours as needed for moderate pain.  . [DISCONTINUED] ipratropium-albuterol (DUONEB) 0.5-2.5 (3) MG/3ML SOLN Take 3 mLs by nebulization every 4 (four) hours as needed.           Objective:   Physical Exam   obese wm nad   01/01/2019       255  10/28/2018     252  08/26/2017       246  05/02/2017       248  04/01/2017      244   02/12/17 248 lb (112.5 kg)  02/11/17 239 lb (108.4 kg)  01/02/17 245 lb (111.1 kg)    Vital signs reviewed - Note on arrival 02 sats  94% on 02 continuous      HEENT: nl dentition / oropharynx. Nl external ear canals without cough reflex -  Mild bilateral non-specific turbinate edema     NECK :  without JVD/Nodes/TM/ nl carotid upstrokes bilaterally   LUNGS: no acc muscle use,  Mild barrel  contour chest wall with bilateral  Distant bs s audible wheeze and  without cough on insp or exp maneuver and mild  Hyperresonant  to  percussion bilaterally     CV:  RRR  no s3 or murmur or increase in P2, and  Trace sym pitting  edema bilaterally   ABD:  Tensely obese but  nontender with pos mid insp Hoover's  in the supine position. No bruits or organomegaly appreciated, bowel sounds nl  MS:   Nl gait/  ext warm without deformities, calf tenderness, cyanosis or clubbing No obvious joint restrictions   SKIN: warm and dry without lesions    NEURO:  alert, approp, nl sensorium with  no motor or cerebellar deficits apparent.      I personally reviewed images and agree with radiology impression as follows:   Chest CTa 01/01/2019  1. No CT findings for pulmonary embolism. 2. Advanced atherosclerotic calcifications involving the aorta and coronary arteries but no aortic aneurysm or dissection. 3. Scattered mediastinal and hilar lymph nodes but no mass or overt adenopathy. 4. Mild cardiac enlargement, vascular congestion, mild interstitial edema and bilateral pleural effusions suggesting CHF. 5. Bibasilar atelectasis but no infiltrates.       Chemistry      Component Value Date/Time   NA 140 12/26/2018 0459   NA 138 07/22/2017 1505   K 3.5 12/26/2018 0459   CL 95 (L) 12/26/2018 0459   CO2 34 (H) 12/26/2018 0459   BUN 22 12/26/2018 0459   BUN 28 (H) 07/22/2017 1505   CREATININE 0.97 12/26/2018 0459      Component Value Date/Time   CALCIUM 8.8 (L) 12/26/2018 0459   ALKPHOS 54 02/25/2017 1931   AST 91 (H) 02/25/2017 1931   ALT 24 02/25/2017 1931   BILITOT 1.2 02/25/2017  1931            Lab Results  Component Value Date   HGB 8.2 (L) 12/29/2018   HGB 8.9 (L) 12/26/2018   HGB 8.8 (L) 12/26/2018   HGB 7.7 (L) 12/25/2018     Lab Results  Component Value Date   TSH 2.890 12/26/2018       Assessment:

## 2019-01-01 NOTE — Telephone Encounter (Signed)
Spoke with pt, aware of labs. He reports when he sees dr hung he says the same thing. Ct order placed and scheduled for today at 2:30 pm. He has a follow up with dr wert today at 3:15pm. No solid foods until after the CT solids only.

## 2019-01-01 NOTE — Telephone Encounter (Signed)
This encounter was created in error - please disregard.

## 2019-01-01 NOTE — Assessment & Plan Note (Addendum)
Quit smoking 12/2016 Spirometry 02/12/2017  FEV1 0.79 (20%)  Ratio 45 but f/v loop is junk - trial of selective BB 02/12/2017  - PFT's  04/01/2017  FEV1 1.29 ( 44% ) ratio 55  p no % improvement from saba p ?  prior to study with DLCO  65/67 % corrects to 85  % for alv volume  - 05/02/2017  After extensive coaching HFA effectiveness =    75% > try bevespi samples x 2 weeks  > no objective change so did not continue  - Referred to rehab 08/26/2017 > improved - PFT's  10/28/2018  FEV1 1.25 (43 % ) ratio 59  p 7 % improvement from saba p nothing prior to study with DLCO  67 % corrects to 88 % for alv volume  With mild curvature/ insp truncation s plateau  - alpha one screen 10/28/2018   MM  Level 144  - 01/01/2019 changed to yupelri/ performist   Pt is Group B in terms of symptom/risk and laba/lama therefore appropriate rx at this point but has such poor IC not able to use devices other than neb well so best option is lama/laba x 4 week trial plus depomedrol 120 mg IM x one just in case there is asthmatic component here.   NB: Note that pleural effusion and copd have the same effect on insp muscles/mechanics (both shorten their length prior to inspiration making them weaker with less force reserve) so they are synergistic in causing sob.

## 2019-01-01 NOTE — Assessment & Plan Note (Signed)
-  HCO3  03/02/17 = 37  -  04/01/2017 : Patient Saturations on Room Air at Rest = 96%  And  while Ambulating = 88% Patient Saturations on 2 Liters of oxygen while Ambulating = 94% - HCO3  12/26/18  = 34   Adequate control on present rx, reviewed in detail with pt > no change in rx needed    Goal for sats is > 90%

## 2019-01-02 NOTE — Telephone Encounter (Signed)
Either [facility] or [facility] is fine. I think there are 3 GI service in town that rotate through [facility]

## 2019-01-02 NOTE — Telephone Encounter (Signed)
Jeremiah [name] has been made aware of test being completed today.

## 2019-01-02 NOTE — Telephone Encounter (Signed)
Spoke with patient and gave lab results that were given yesterday and patient state he was looking for another GI doctor. Because they were not happy with Dr Benson Norway. Wanted to know who Jeremiah [name] recommends.

## 2019-01-05 NOTE — Addendum Note (Signed)
Addended by: Ulice Brilliant T on: 01/05/2019 03:18 PM   Modules accepted: Orders

## 2019-01-05 NOTE — Telephone Encounter (Signed)
Contacted patient and let him know I will have laced the referral for him to have a new GI doctor. Patient voiced understanding.

## 2019-01-05 NOTE — Telephone Encounter (Signed)
Spoke with schedulers about Jeremiah [name] referral and was informed that patient must contact his current [name] doctor and have his records sent to [name] for them to review then they will contact the patient with an appt. Patient voiced understanding.

## 2019-01-05 NOTE — Addendum Note (Signed)
Addended by: Ulice Brilliant T on: 01/05/2019 01:36 PM   Modules accepted: Orders

## 2019-01-06 ENCOUNTER — Ambulatory Visit: Payer: Federal, State, Local not specified - PPO | Admitting: Physician Assistant

## 2019-01-06 ENCOUNTER — Encounter: Payer: Self-pay | Admitting: Physician Assistant

## 2019-01-06 ENCOUNTER — Encounter: Payer: Self-pay | Admitting: Internal Medicine

## 2019-01-06 VITALS — BP 108/58 | HR 72 | Ht 67.0 in | Wt 252.0 lb

## 2019-01-06 DIAGNOSIS — I25810 Atherosclerosis of coronary artery bypass graft(s) without angina pectoris: Secondary | ICD-10-CM

## 2019-01-06 DIAGNOSIS — I10 Essential (primary) hypertension: Secondary | ICD-10-CM

## 2019-01-06 DIAGNOSIS — E039 Hypothyroidism, unspecified: Secondary | ICD-10-CM

## 2019-01-06 DIAGNOSIS — J9612 Chronic respiratory failure with hypercapnia: Secondary | ICD-10-CM

## 2019-01-06 DIAGNOSIS — I5032 Chronic diastolic (congestive) heart failure: Secondary | ICD-10-CM

## 2019-01-06 DIAGNOSIS — E785 Hyperlipidemia, unspecified: Secondary | ICD-10-CM | POA: Diagnosis not present

## 2019-01-06 DIAGNOSIS — J9611 Chronic respiratory failure with hypoxia: Secondary | ICD-10-CM | POA: Diagnosis not present

## 2019-01-06 DIAGNOSIS — J449 Chronic obstructive pulmonary disease, unspecified: Secondary | ICD-10-CM

## 2019-01-06 NOTE — Patient Instructions (Addendum)
Medication Instructions:   Your physician recommends that you continue on your current medications as directed. Please refer to the Current Medication list given to you today.  If you need a refill on your cardiac medications before your next appointment, please call your pharmacy.   Lab work:  BMET  If you have labs (blood work) drawn today and your tests are completely normal, you will receive your results only by: Marland Kitchen MyChart Message (if you have MyChart) OR . A paper copy in the mail If you have any lab test that is abnormal or we need to change your treatment, we will call you to review the results.  Testing/Procedures:  NONE  Follow-Up: At [facility], you and your health needs are our priority.  As part of our continuing mission to provide you with exceptional heart care, we have created designated Provider Care Teams.  These Care Teams include your primary Cardiologist (physician) and Advanced Practice Providers (APPs -  Physician Assistants and Nurse Practitioners) who all work together to provide you with the care you need, when you need it.  You will need a follow up appointment in 4-6 weeks with Pixie Casino, MD

## 2019-01-06 NOTE — Progress Notes (Addendum)
Cardiology Office Note    Date:  01/08/2019   ID:  Zalen Sequeira, DOB 10-09-1946, MRN [id number]  PCP:  [name], MD  Cardiologist:  Dr. Debara Pickett / Dr. Curt Bears  Chief Complaint  Patient presents with  . Follow-up    seen for Dr. Debara Pickett    History of Present Illness:  Rohit Deloria is a 73 y.o. male who is a [place] He had PMH of HTN, HLD, hypothyroidism, COPD, OSA, prostate CA, and CAD s/p CABG LIMA to LAD, SVG to OM1, and SVG to RPDA in [place] in 2006.In 2015, he underwent Myoview which showed possible reversible ischemia. He was referred for cardiac catheterization at [facility] [place]. This demonstrated patent 3 bypass graft with multivessel coronary artery disease. He was having palpitation the time and monitor showed PSVT. He was scheduled for possible ablation but this was delayed as he was dealing with ankle fracture and a nonhealing wound. He had a sleep study done by [facility] physicians, this demonstrated severe obstructive sleep apnea and BiPAP was recommended. [place] echocardi[place] in [place] was in 2015 showing EF 58% and no significant valvular disease. Last cardiac catheterization was performed on 05/08/2016, and this showed 85% left main lesion, 100% ostial OM1, 100% ostial RCA 100% mid RCA lesion, 100% proximal LAD lesion, patent SVG to PDAwith 40% proximal stenosis, patent SVG to left circumflex, patent LIMA to LAD. He had AVNRT ablation on 08/22/2016. Despite atrial fibrillation is listed under his medical history list, I have not seen any evidence or previous documented atrial fibrillation.   Patient was admitted multiple times in 2018 with weakness and found to have significant anemia and mild CHF.  Echocardiogram obtained on 02/19/2017 showed EF 45 to 50%, mild LVH, moderate focal basal hypertrophy of the septum, mid to distal anterior, anteroapical, inferior wall hypokinesis, mild aortic stenosis, mild MR, PA peak pressure 50  mmHg.  Patient was last seen by Dr. Debara Pickett in September 2019, at which time he was doing well.  Carotid Doppler obtained in October 2019 showed mild disease bilaterally.  More recently, patient was admitted to hospital in January 2020 for fatigue and dyspnea.  He was found to have anemia with hemoglobin of 7.7 and CHF exacerbation.  BNP was elevated at 272.  He was given IV Lasix and had 1 unit of packed red blood cell transfusion.  Echocardiogram was repeated which showed EF of 60 to 65% severe LAE.  He was discharged on the same dose of Lasix at 40 mg daily.  His discharge weight was 254 pounds.   I last saw the patient on 12/29/2018, his oxygen saturation was down to 68%.  His weight is also increased to 208.2 pounds.  He had a trace amount of lower extremity edema.  I felt his chronic dyspnea is likely due to combination of underlying lung disease, anemia, diastolic heart failure and obesity.  I did not think he was significantly volume overloaded, however given his weight, he can easily hide fluid.  I increased his Lasix to 40 mg twice daily as a trial.  He returns today for follow-up.  He says his breathing is slightly better.  He appears to be euvolemic on physical exam.  I will obtain a basic metabolic panel.  He is still dependent on oxygen, majority of the issue is likely pulmonary in nature.  I do not think I can improve his breathing any further than the current condition.    Past  Medical History:  Diagnosis Date  . A-fib ([place])   . CAD (coronary artery disease)    a. 2006: CABG in 2006 with LIMA-LAD, SVG-OM1, and SVG-RPDA  . Chronic pain 03/21/2016  . Essential hypertension 03/21/2016  . History of prostate cancer 03/21/2016  . HLD (hyperlipidemia)   . HTN (hypertension)   . Hx of CABG 2006  . Hypercholesteremia 03/21/2016  . Hypothyroidism   . Medication management 03/21/2016  . Morbid obesity ([place]) 03/21/2016  . Myocardial infarct ([place])   . OSA (obstructive sleep apnea) 03/21/2016  . Pain  in right ankle and joints of right foot 03/21/2016  . Paronychia 03/21/2016  . Primary insomnia 03/21/2016  . Prostate cancer ([place]) 2008  . Tobacco dependence 03/21/2016    Past Surgical History:  Procedure Laterality Date  . ANKLE SURGERY  12/2013  . CARDIAC CATHETERIZATION N/A 05/08/2016   Procedure: Left Heart Cath and Cors/Grafts Angiography;  Surgeon: Belva Crome, MD;  Location: [facility];  Service: Cardiovascular;  Laterality: N/A;  . CORONARY ARTERY BYPASS GRAFT  2006   x3  . ELECTROPHYSIOLOGIC STUDY N/A 08/24/2016   Procedure: SVT Ablation;  Surgeon: Will Meredith Leeds, MD;  Location: [facility];  Service: Cardiovascular;  Laterality: N/A;  . PROSTATE SURGERY  2008  . PTCA      Current Medications: Outpatient Medications Prior to Visit  Medication Sig Dispense Refill  . albuterol (PROVENTIL) (2.5 MG/3ML) 0.083% nebulizer solution Take 3 mLs (2.5 mg total) by nebulization every 4 (four) hours as needed for wheezing or shortness of breath. 75 mL 12  . aspirin EC 81 MG tablet Take 81 mg by mouth daily.    Marland Kitchen atorvastatin (LIPITOR) 80 MG tablet Take 80 mg by mouth daily.    . betamethasone dipropionate 0.05 % lotion Apply 1 application topically 2 (two) times daily.     . bisoprolol (ZEBETA) 5 MG tablet Take 5 mg by mouth daily.    . ferrous sulfate 325 (65 FE) MG tablet Take 1 tablet (325 mg total) by mouth daily with breakfast. 30 tablet 1  . formoterol (PERFOROMIST) 20 MCG/2ML nebulizer solution Take 2 mLs (20 mcg total) by nebulization 2 (two) times daily. 120 mL 0  . glimepiride (AMARYL) 1 MG tablet Take 1 tablet by mouth daily.    Marland Kitchen levothyroxine (SYNTHROID, LEVOTHROID) 75 MCG tablet Take 75 mcg by mouth daily before breakfast.    . levothyroxine (SYNTHROID, LEVOTHROID) 88 MCG tablet Take 88 mcg by mouth daily before breakfast.    . metFORMIN (GLUCOPHAGE) 500 MG tablet Take 750 mg by mouth 2 (two) times daily with a meal.     . OXYGEN 4 lpm with 24/7 AHC    .  pantoprazole (PROTONIX) 40 MG tablet Take 40 mg by mouth daily.    . Revefenacin (YUPELRI) 175 MCG/3ML SOLN Inhale 1 vial into the lungs daily. 84 mL 0  . traMADol (ULTRAM) 50 MG tablet Take by mouth every 6 (six) hours as needed for moderate pain.    . furosemide (LASIX) 40 MG tablet Take 1 tablet (40 mg total) by mouth 2 (two) times daily. 60 tablet 11  . potassium chloride SA (K-DUR,KLOR-CON) 20 MEQ tablet Take 1 tablet (20 mEq total) by mouth 2 (two) times daily. 60 tablet 11   No facility-administered medications prior to visit.      Allergies:   Scallops [shellfish allergy] and Fluzone [flu virus vaccine]   Social History   Socioeconomic History  .  Marital status: Married    Spouse name: Not on file  . Number of children: 0  . Years of education: Not on file  . Highest education level: Not on file  Occupational History  . Occupation: retired - Passenger transport manager  Social Needs  . Financial resource strain: Not on file  . Food insecurity:    Worry: Not on file    Inability: Not on file  . Transportation needs:    Medical: Not on file    Non-medical: Not on file  Tobacco Use  . Smoking status: Former Smoker    Packs/day: 0.50    Years: 60.00    Pack years: 30.00    Types: Cigarettes    Last attempt to quit: 12/15/2016    Years since quitting: 2.0  . Smokeless tobacco: Never Used  Substance and Sexual Activity  . Alcohol use: Yes    Comment: very seldom  . Drug use: No  . Sexual activity: Not on file  Lifestyle  . Physical activity:    Days per week: Not on file    Minutes per session: Not on file  . Stress: Not on file  Relationships  . Social connections:    Talks on phone: Not on file    Gets together: Not on file    Attends religious service: Not on file    Active member of club or organization: Not on file    Attends meetings of clubs or organizations: Not on file    Relationship status: Not on file  Other Topics Concern  . Not on file  Social History  Narrative   Retired, married, no children      epworth sleepiness scale = 9 (04/13/16) (has OSA)     Family History:  The patient's family history includes Dementia in his mother; Diabetes in his sister.   ROS:   Please see the history of present illness.    ROS All other systems reviewed and are negative.   PHYSICAL EXAM:   VS:  BP (!) 108/58   Pulse 72   Ht 5' 7"  (1.702 m)   Wt 252 lb (114.3 kg)   SpO2 94%   BMI 39.47 kg/m    GEN: Well nourished, well developed, in no acute distress  HEENT: normal  Neck: no JVD, carotid bruits, or masses Cardiac: RRR; no murmurs, rubs, or gallops,no edema  Respiratory: Decreased breath sounds bilaterally GI: soft, nontender, nondistended, + BS MS: no deformity or atrophy  Skin: warm and dry, no rash Neuro:  Alert and Oriented x 3, Strength and sensation are intact Psych: euthymic mood, full affect  Wt Readings from Last 3 Encounters:  01/06/19 252 lb (114.3 kg)  01/01/19 255 lb (115.7 kg)  12/29/18 258 lb 3.2 oz (117.1 kg)      Studies/Labs Reviewed:   EKG:  EKG is not ordered today.    Recent Labs: 12/25/2018: B Natriuretic Peptide 272.2 12/26/2018: TSH 2.890 12/29/2018: Hemoglobin 8.2; Platelets 208 01/06/2019: BUN 22; Creatinine, Ser 1.21; Potassium 4.2; Sodium 142   Lipid Panel No results found for: CHOL, TRIG, HDL, CHOLHDL, VLDL, LDLCALC, LDLDIRECT  Additional studies/ records that were reviewed today include:   Echo 12/26/2018 LV EF: 60% -   65%  ------------------------------------------------------------------- Indications:      CHF - 428.0.  ------------------------------------------------------------------- History:   PMH:   Coronary artery disease.  Chronic obstructive pulmonary disease.  Risk factors:  Current tobacco use. Hypertension. Dyslipidemia.  ------------------------------------------------------------------- Study Conclusions  - Left ventricle: The  cavity size was normal. Wall thickness was    increased in a pattern of mild LVH. There was mild focal basal   hypertrophy of the septum. Systolic function was normal. The   estimated ejection fraction was in the range of 60% to 65%. Wall   motion was normal; there were no regional wall motion   abnormalities. Doppler parameters are consistent with high   ventricular filling pressure. - Aortic valve: Noncoronary cusp mobility was mildly restricted. - Mitral valve: Severely calcified annulus. The findings are   consistent with mild stenosis. Valve area by pressure half-time:   2.09 cm^2. - Left atrium: The atrium was severely dilated. - Pulmonary arteries: Systolic pressure was moderately increased.   PA peak pressure: 49 mm Hg (S).  Impressions:  - Definity used; normal LV systolic function; mild LVH; elevated LV   filling pressure; sclerotic aortic valve; severe MAC with mild MS   (mean gradient 5 mmHg); severe LAE; trace TR with moderate   pulmonary hypertension.    ASSESSMENT:    1. Chronic respiratory failure with hypoxia and hypercapnia (HCC)   2. Chronic diastolic heart failure ([place])   3. Essential hypertension   4. Hyperlipidemia LDL goal <70   5. Chronic obstructive pulmonary disease, unspecified COPD type ([place])   6. Hypothyroidism, unspecified type   7. Coronary artery disease involving coronary bypass graft of native heart without angina pectoris      PLAN:  In order of problems listed above:  1. Chronic respiratory failure: On oxygen 24/7, likely due to combination of pulmonary issue, chronic diastolic heart failure, obesity and anemia.  He is being followed by Dr. Melvyn Novas of pulmonology service  2. Chronic diastolic heart failure: I increase his Lasix as a trial to see if that this will improve his breathing.  His breathing did improve slightly.  He appears to be euvolemic on physical exam today.  Obtain basic metabolic panel.  3. CAD: No new chest discomfort.  Recent echocardiogram obtained in January 2020  showed EF of 60 to 65%.    4. Hypertension: Blood pressure stable on current therapy  5. Hyperlipidemia: On Lipitor 80 mg daily.  Will defer annual lipid panel to primary care provider  6. COPD: Significant obstructive disease noted on previous PFT  7. Hypothyroidism: Managed by primary care provider  8. Anemia: will defer to GI and primary care provider    Medication Adjustments/Labs and Tests Ordered: Current medicines are reviewed at length with the patient today.  Concerns regarding medicines are outlined above.  Medication changes, Labs and Tests ordered today are listed in the Patient Instructions below. Patient Instructions  Medication Instructions:   Your physician recommends that you continue on your current medications as directed. Please refer to the Current Medication list given to you today.  If you need a refill on your cardiac medications before your next appointment, please call your pharmacy.   Lab work:  BMET  If you have labs (blood work) drawn today and your tests are completely normal, you will receive your results only by: Marland Kitchen MyChart Message (if you have MyChart) OR . A paper copy in the mail If you have any lab test that is abnormal or we need to change your treatment, we will call you to review the results.  Testing/Procedures:  NONE  Follow-Up: At [facility], you and your health needs are our priority.  As part of our continuing mission to provide you with exceptional heart care, we have created designated Provider  Care Teams.  These Care Teams include your primary Cardiologist (physician) and Advanced Practice Providers (APPs -  Physician Assistants and Nurse Practitioners) who all work together to provide you with the care you need, when you need it.  You will need a follow up appointment in 4-6 weeks with Pixie Casino, MD         Signed, Almyra Deforest, [place]  01/08/2019 11:59 PM    [facility] [place],  [place], [place]  [address] Phone: [phone]; Fax: [phone]

## 2019-01-07 LAB — BASIC METABOLIC PANEL
BUN/Creatinine Ratio: 18 (ref 10–24)
BUN: 22 mg/dL (ref 8–27)
CALCIUM: 9.2 mg/dL (ref 8.6–10.2)
CHLORIDE: 93 mmol/L — AB (ref 96–106)
CO2: 31 mmol/L — ABNORMAL HIGH (ref 20–29)
Creatinine, Ser: 1.21 mg/dL (ref 0.76–1.27)
GFR calc Af Amer: 69 mL/min/{1.73_m2} (ref >59)
GFR calc non Af Amer: 60 mL/min/{1.73_m2} (ref >59)
Glucose: 128 mg/dL — ABNORMAL HIGH (ref 65–99)
Potassium: 4.2 mmol/L (ref 3.5–5.2)
Sodium: 142 mmol/L (ref 134–144)

## 2019-01-08 ENCOUNTER — Encounter: Payer: Self-pay | Admitting: Physician Assistant

## 2019-01-08 ENCOUNTER — Other Ambulatory Visit: Payer: Self-pay

## 2019-01-08 MED ORDER — FUROSEMIDE 40 MG PO TABS: 60.0000 mg | ORAL_TABLET | Freq: Every day | ORAL | 11 refills | Status: DC

## 2019-01-08 MED ORDER — POTASSIUM CHLORIDE CRYS ER 20 MEQ PO TBCR: 30.0000 meq | EXTENDED_RELEASE_TABLET | Freq: Every day | ORAL | 11 refills | Status: DC

## 2019-01-09 DIAGNOSIS — D509 Iron deficiency anemia, unspecified: Secondary | ICD-10-CM | POA: Diagnosis not present

## 2019-01-12 DIAGNOSIS — D649 Anemia, unspecified: Secondary | ICD-10-CM | POA: Diagnosis not present

## 2019-01-19 ENCOUNTER — Other Ambulatory Visit: Payer: Self-pay | Admitting: Gastroenterology

## 2019-01-19 DIAGNOSIS — D500 Iron deficiency anemia secondary to blood loss (chronic): Secondary | ICD-10-CM | POA: Diagnosis not present

## 2019-01-19 DIAGNOSIS — R195 Other fecal abnormalities: Secondary | ICD-10-CM | POA: Diagnosis not present

## 2019-01-29 ENCOUNTER — Other Ambulatory Visit: Payer: Self-pay | Admitting: *Deleted

## 2019-01-29 DIAGNOSIS — J449 Chronic obstructive pulmonary disease, unspecified: Secondary | ICD-10-CM

## 2019-01-29 DIAGNOSIS — I5033 Acute on chronic diastolic (congestive) heart failure: Secondary | ICD-10-CM | POA: Diagnosis not present

## 2019-01-30 ENCOUNTER — Ambulatory Visit: Payer: Federal, State, Local not specified - PPO | Admitting: Internal Medicine

## 2019-02-05 ENCOUNTER — Encounter: Payer: Self-pay | Admitting: Internal Medicine

## 2019-02-05 ENCOUNTER — Ambulatory Visit (INDEPENDENT_AMBULATORY_CARE_PROVIDER_SITE_OTHER): Payer: Federal, State, Local not specified - PPO | Admitting: Internal Medicine

## 2019-02-05 ENCOUNTER — Ambulatory Visit: Payer: Federal, State, Local not specified - PPO | Admitting: Internal Medicine

## 2019-02-05 VITALS — BP 128/62 | HR 85 | Ht 67.0 in | Wt 251.0 lb

## 2019-02-05 DIAGNOSIS — J449 Chronic obstructive pulmonary disease, unspecified: Secondary | ICD-10-CM

## 2019-02-05 DIAGNOSIS — J9612 Chronic respiratory failure with hypercapnia: Secondary | ICD-10-CM

## 2019-02-05 DIAGNOSIS — J9611 Chronic respiratory failure with hypoxia: Secondary | ICD-10-CM | POA: Diagnosis not present

## 2019-02-05 DIAGNOSIS — I25810 Atherosclerosis of coronary artery bypass graft(s) without angina pectoris: Secondary | ICD-10-CM

## 2019-02-05 LAB — PULMONARY FUNCTION TEST
DL/VA % pred: 120 %
DL/VA: 4.91 ml/min/mmHg/L
DLCO COR % PRED: 80 %
DLCO cor: 18.95 ml/min/mmHg
DLCO unc % pred: 61 %
DLCO unc: 14.34 ml/min/mmHg
FEF 25-75 POST: 1.03 L/s
FEF 25-75 Pre: 0.65 L/sec
FEF2575-%Change-Post: 58 %
FEF2575-%Pred-Post: 48 %
FEF2575-%Pred-Pre: 30 %
FEV1-%Change-Post: 20 %
FEV1-%Pred-Post: 53 %
FEV1-%Pred-Pre: 44 %
FEV1-POST: 1.53 L
FEV1-Pre: 1.27 L
FEV1FVC-%Change-Post: 9 %
FEV1FVC-%PRED-PRE: 82 %
FEV6-%Change-Post: 9 %
FEV6-%Pred-Post: 62 %
FEV6-%Pred-Pre: 56 %
FEV6-Post: 2.28 L
FEV6-Pre: 2.07 L
FEV6FVC-%Change-Post: 0 %
FEV6FVC-%Pred-Post: 105 %
FEV6FVC-%Pred-Pre: 105 %
FVC-%Change-Post: 10 %
FVC-%PRED-PRE: 53 %
FVC-%Pred-Post: 59 %
FVC-PRE: 2.09 L
FVC-Post: 2.31 L
Post FEV1/FVC ratio: 67 %
Post FEV6/FVC ratio: 99 %
Pre FEV1/FVC ratio: 61 %
Pre FEV6/FVC Ratio: 99 %
RV % pred: 109 %
RV: 2.52 L
TLC % pred: 75 %
TLC: 4.83 L

## 2019-02-05 MED ORDER — FORMOTEROL FUMARATE 20 MCG/2ML IN NEBU: 20.0000 ug | INHALATION_SOLUTION | Freq: Two times a day (BID) | RESPIRATORY_TRACT | 11 refills | Status: DC

## 2019-02-05 MED ORDER — REVEFENACIN 175 MCG/3ML IN SOLN: 1.0000 | Freq: Every day | RESPIRATORY_TRACT | 11 refills | Status: DC

## 2019-02-05 MED ORDER — FORMOTEROL FUMARATE 20 MCG/2ML IN NEBU: 20.0000 ug | INHALATION_SOLUTION | Freq: Two times a day (BID) | RESPIRATORY_TRACT | 0 refills | Status: DC

## 2019-02-05 MED ORDER — REVEFENACIN 175 MCG/3ML IN SOLN: 1.0000 | Freq: Every day | RESPIRATORY_TRACT | 0 refills | Status: DC

## 2019-02-05 NOTE — Progress Notes (Addendum)
Subjective:     Patient ID: Jeremiah [name], male   DOB: 06-05-1946,    MRN: [id number]     Brief patient profile:  49 yowm quit smoking 12/15/16 with onset of doe x 2008 and GOLD III criteria confirmed 10/28/2018 with restictive component due to obesity / prev eval for osa but could not tol cpap.  After treatment with a Lama/lama he improved to GOLD II criteria as of 02/05/2019     History of Present Illness   02/12/2017 1st Jeremiah [name] office visit/ Jeremiah [name]  Re ? Copd  Chief Complaint  Patient presents with  . [name] Consult    Referred by Dr. Dorthy [name]. Pt c/o SOB for the past 10 yrs. He gets SOB when running and working "which I don't do anymore".   MMRC2 = can't walk a nl pace on a flat grade s sob but does fine slow and flat eg shopping / limited also by R chronic ankle pain p fx rec Stop lopressor (metaprolol) and start bisoprolol 5 mg twice daily  If face/eye symptoms  worse or fever go back to ER  Please schedule a follow up office visit in 4 weeks, sooner if needed with pfts on return    04/01/2017  f/u ov/Jeremiah [name] re: copd gold III/ 02 2lpm  Chief Complaint  Patient presents with  . Follow-up    Breathing is doing well. No new co's today.    Goal is to walk dogs beyond the end of property line but can't presently due to sob   rec Protonix Take 30- 60 min before your first and last meals of the day  GERD diet  Please schedule a follow up office visit in 4 weeks, sooner if needed  with all medications /inhalers/ solutions in hand so we can verify exactly what you are taking. This includes all medications from all doctors and over the counters    05/02/2017  f/u ov/Jeremiah [name] re: COPD GOLD III / did not bring meds as requested but maint on duoneb only  Chief Complaint  Patient presents with  . Follow-up    Pt states "I still sound like a frog and I still snore"- no new co's today. He is requesting order for POC for travel. He only uses o2 with sleep. He also requestes humidity to be  added to his o2.   sleeps fine on 2lpm s am ha / not consistent with amb 02  Doe = MMRC2 = can't walk a nl pace on a flat grade s sob but does fine slow and flat on 02 and wants POC rec 02 2lpm at bedtime with humidity which we will arrange  Please see patient coordinator before you leave today  to schedule ambulatory 02 titration to see if eligible for POC              Date of Admission: 12/25/2018                      Date of Discharge: 12/26/2018   Discharge Diagnoses/Problem List:  CHF exacerbation Symptomatic anemia COPD CAD DM-II HTN HLD Hypothyroidism OSA   Issues for Follow Up:  1. CHF - patient on bisoprolol and lasix at home.  Consider increasing lasix dose and adding ARB 2. Anemia - patient anemic with low ferritin.  Get CBC 3. Colonoscopy - patient overdue for colonoscopy.  Important in setting of anemia.   4. COPD - on duonebs at home PRN. Wheezing present on exam. Consider adding a  controller medication.  5. Diabetes - consider discontinuing glimepiride given      01/01/2019  Extended post hosp f/u  ov/Jeremiah [name] re: copd/ chf/ anemia - not on maint rx, just duoneb prn  Chief Complaint  Patient presents with  . Hospitalization Follow-up    Breathing has improved. He is now on 4lpm o2 24/7. CT Angiogram done today.    Dyspnea:  MMRC4  = sob if tries to leave home or while getting dressed  Even on 02 but ok at rest sitting  Cough: min dry  Sleeping: 45 degrees  SABA use: duoneb helps the most 02: 4lpm 24 /7  rec Plan A = Automatic = performist 20 mcg twice daily in neb with yupelri 175 mcg each am  Plan B = Backup Only use your albuterol nebulizer as a rescue medication  Goal for 02 is to keep sats above 90% at all times so may be able to adjust back down to 2lpm at rest to save on protable tank  and turn up with walking to whatever setting needed to reach this goal. Please schedule a follow up office visit in 4 weeks, sooner if needed  with all medications  /inhalers/ solutions in hand so we can verify exactly what you are taking. This includes all medications from all doctors and over the counters  - PFTs on return     02/05/2019  f/u ov/Jeremiah [name] re: GOLD II with reversible component  Chief Complaint  Patient presents with  . Follow-up    PFT's done today. Breathing is doing well. He has not had to use his albuterol neb.   Dyspnea:  MMRC3 = can't walk 100 yards even at a slow pace at a flat grade s stopping due to sob   Cough: none Sleeping: sleeping 30 degrees  SABA use: no saba  02: 4lpm walking / 3 rest/ 3 sleeping    No obvious day to day or daytime variability or assoc excess/ purulent sputum or mucus plugs or hemoptysis or cp or chest tightness, subjective wheeze or overt sinus or hb symptoms.   Sleeping as above  without nocturnal  or early am exacerbation  of respiratory  c/o's or need for noct saba. Also denies any obvious fluctuation of symptoms with weather or environmental changes or other aggravating or alleviating factors except as outlined above   No unusual exposure hx or h/o childhood pna/ asthma or knowledge of premature birth.  Current Allergies, Complete Past Medical History, Past Surgical History, Family History, and Social History were reviewed in [employer] record.  ROS  The following are not active complaints unless bolded Hoarseness, sore throat, dysphagia, dental problems, itching, sneezing,  nasal congestion or discharge of excess mucus or purulent secretions, ear ache,   fever, chills, sweats, unintended wt loss or wt gain, classically pleuritic or exertional cp,  orthopnea pnd or arm/hand swelling  or leg swelling, presyncope, palpitations, abdominal pain, anorexia, nausea, vomiting, diarrhea  or change in bowel habits or change in bladder habits, change in stools or change in urine, dysuria, hematuria,  rash, arthralgias, visual complaints, headache, numbness, weakness or ataxia or problems with  walking or coordination,  change in mood or  memory.        Current Meds  Medication Sig  . albuterol (PROVENTIL) (2.5 MG/3ML) 0.083% nebulizer solution Take 3 mLs (2.5 mg total) by nebulization every 4 (four) hours as needed for wheezing or shortness of breath.  Marland Kitchen aspirin EC 81 MG tablet Take  81 mg by mouth daily.  Marland Kitchen atorvastatin (LIPITOR) 80 MG tablet Take 80 mg by mouth daily.  . betamethasone dipropionate 0.05 % lotion Apply 1 application topically 2 (two) times daily.   . bisoprolol (ZEBETA) 5 MG tablet Take 5 mg by mouth daily.  . ferrous sulfate 325 (65 FE) MG tablet Take 1 tablet (325 mg total) by mouth daily with breakfast.  . formoterol (PERFOROMIST) 20 MCG/2ML nebulizer solution Take 2 mLs (20 mcg total) by nebulization 2 (two) times daily.  . furosemide (LASIX) 40 MG tablet Take 1.5 tablets (60 mg total) by mouth daily.  Marland Kitchen glimepiride (AMARYL) 1 MG tablet Take 1 tablet by mouth daily.  Marland Kitchen levothyroxine (SYNTHROID, LEVOTHROID) 75 MCG tablet Take 75 mcg by mouth daily before breakfast.  . levothyroxine (SYNTHROID, LEVOTHROID) 88 MCG tablet Take 88 mcg by mouth daily before breakfast.  . metFORMIN (GLUCOPHAGE) 500 MG tablet Take 750 mg by mouth 2 (two) times daily with a meal.   . OXYGEN 4 lpm with 24/7 AHC  . pantoprazole (PROTONIX) 40 MG tablet Take 40 mg by mouth daily.  . potassium chloride SA (K-DUR,KLOR-CON) 20 MEQ tablet Take 1.5 tablets (30 mEq total) by mouth daily.  . Revefenacin (YUPELRI) 175 MCG/3ML SOLN Inhale 1 vial into the lungs daily.  . traMADol (ULTRAM) 50 MG tablet Take by mouth every 6 (six) hours as needed for moderate pain.                Objective:   Physical Exam    02/05/2019         251  01/01/2019       255  10/28/2018     252  08/26/2017       246  05/02/2017       248  04/01/2017      244   02/12/17 248 lb (112.5 kg)  02/11/17 239 lb (108.4 kg)  01/02/17 245 lb (111.1 kg)    Vital signs reviewed - Note on arrival 02 sats  95% on 4lpm      5c mild Tensely obese    HEENT: nl dentition / oropharynx. Nl external ear canals without cough reflex -  Mild bilateral non-specific turbinate edema     NECK :  without JVD/Nodes/TM/ nl carotid upstrokes bilaterally   LUNGS: no acc muscle use,  Mild barrel  contour chest wall with bilateral  Distant bs s audible wheeze and  without cough on insp or exp maneuver and mild  Hyperresonant  to  percussion bilaterally     CV:  RRR  no s3 or murmur or increase in P2, and no edema   ABD:  Tensely obese with limited excursion  in the supine position. No bruits or organomegaly appreciated, bowel sounds nl  MS:   Nl gait/  ext warm without deformities, calf tenderness, cyanosis or clubbing No obvious joint restrictions   SKIN: warm and dry without lesions    NEURO:  alert, approp, nl sensorium with  no motor or cerebellar deficits apparent.                 Assessment:

## 2019-02-05 NOTE — Progress Notes (Signed)
PFT done today. 

## 2019-02-05 NOTE — Patient Instructions (Signed)
No change nebulizers  Keep working on weight loss > be sure you check your 02 saturations at rest/ activity with goal of keeping above 90% at all times   Please schedule a follow up visit in 3 months but call sooner if needed

## 2019-02-06 ENCOUNTER — Encounter: Payer: Self-pay | Admitting: Internal Medicine

## 2019-02-06 NOTE — Assessment & Plan Note (Signed)
-  HCO3  03/02/17 = 37  -  04/01/2017 : Patient Saturations on Room Air at Rest = 96%  And  while Ambulating = 88% Patient Saturations on 2 Liters of oxygen while Ambulating = 94% - HCO3  12/26/18  = 34   Adequate control on present rx, reviewed in detail with pt > no change in rx needed    I did strongly advised him to monitor his saturations with activity because he is anemic and the last thing he needs is to be hypoxic as well.

## 2019-02-06 NOTE — Assessment & Plan Note (Signed)
ERV 20% by PFT's 10/28/2018  And 35% 02/05/2019 > reviewed with pt in detail  Body mass index is 39.31 kg/m.  -  trending down slightly/ encouraged! Lab Results  Component Value Date   TSH 2.890 12/26/2018     Contributing to gerd risk/ doe/reviewed the need and the process to achieve and maintain neg calorie balance > defer f/u primary care including intermittently monitoring thyroid status     I had an extended discussion with the patient reviewing all relevant studies completed to date and  lasting 15 to 20 minutes of a 25 minute visit    Each maintenance medication was reviewed in detail including most importantly the difference between maintenance and prns and under what circumstances the prns are to be triggered using an action plan format that is not reflected in the computer generated alphabetically organized AVS.     Please see AVS for specific instructions unique to this visit that I personally wrote and verbalized to the the pt in detail and then reviewed with pt  by my nurse highlighting any  changes in therapy recommended at today's visit to their plan of care.

## 2019-02-06 NOTE — Assessment & Plan Note (Addendum)
Quit smoking 12/2016 Spirometry 02/12/2017  FEV1 0.79 (20%)  Ratio 45 but f/v loop is junk - trial of selective BB 02/12/2017  - PFT's  04/01/2017  FEV1 1.29 ( 44% ) ratio 55  p no % improvement from saba p ?  prior to study with DLCO  65/67 % corrects to 85  % for alv volume  - 05/02/2017  After extensive coaching HFA effectiveness =    75% > try bevespi samples x 2 weeks  > no objective change so did not continue  - Referred to rehab 08/26/2017 > improved - PFT's  10/28/2018  FEV1 1.25 (43 % ) ratio 59  p 7 % improvement from saba p nothing prior to study with DLCO  67 % corrects to 88 % for alv volume  With mild curvature/ insp truncation s plateau  - alpha one screen 10/28/2018   MM  Level 144  - 01/01/2019 changed to yupelri/ performist  PFT's  02/05/2019  FEV1 1.53 (53 % ) ratio 0.76  p 20 % improvement from saba p performist/yupelri prior to study with DLCO  61  % corrects to 120  % for alv volume    He has an ERV of only 35% and restrictive physiology classic for the effects of obesity that are more important really than the COPD component at this point and of course his anemia needs to be addressed as well in the setting which I understand is the plan.  >>>   I did not therefore recommend any change in therapy.   If he  does start to have flareups budesonide 0.25 mg twice daily needs to be added to his nebulizer.

## 2019-02-13 ENCOUNTER — Telehealth: Payer: Self-pay | Admitting: *Deleted

## 2019-02-13 DIAGNOSIS — Z5181 Encounter for therapeutic drug level monitoring: Secondary | ICD-10-CM

## 2019-02-13 DIAGNOSIS — I10 Essential (primary) hypertension: Secondary | ICD-10-CM

## 2019-02-13 LAB — BASIC METABOLIC PANEL
BUN / CREAT RATIO: 17 (ref 10–24)
BUN: 19 mg/dL (ref 8–27)
CO2: 28 mmol/L (ref 20–29)
Calcium: 9.4 mg/dL (ref 8.6–10.2)
Chloride: 95 mmol/L — ABNORMAL LOW (ref 96–106)
Creatinine, Ser: 1.11 mg/dL (ref 0.76–1.27)
GFR calc Af Amer: 77 mL/min/{1.73_m2} (ref >59)
GFR calc non Af Amer: 66 mL/min/{1.73_m2} (ref >59)
Glucose: 230 mg/dL — ABNORMAL HIGH (ref 65–99)
Potassium: 4.6 mmol/L (ref 3.5–5.2)
Sodium: 139 mmol/L (ref 134–144)

## 2019-02-13 NOTE — Telephone Encounter (Signed)
Patient in office today for labs. Unsure of what patient needs. Discussed with Janan Ridge PA and will have patient get BMET. Order placed and given to [facility]

## 2019-02-17 ENCOUNTER — Telehealth: Payer: Self-pay | Admitting: *Deleted

## 2019-02-17 NOTE — Telephone Encounter (Signed)
COVID-19 Pre-Screening Questions:  . Have you been in contact with someone that was recently sick with fever/cough or confirmed to have the [place] virus?  NO  *Contact with a confirmed case should stay at home, away from confirmed patient, monitor symptoms, and reach out to PCP for e-visit/additional testing.  2. Do you have any of the following symptoms [cough, fever (100.4 or greater)], and/or shortness of breath)?  NO  *ALL PTS W/ FEVER SHOULD BE REFERRED TO PCP FOR E-VISIT* ________________________________________________________________________________  Cardiac Questionnaire:    Since your last visit or hospitalization:    1. Have you been having chest pain?NO   2. Have you been having shortness of breath? NO   3. Have you been having increasing edema, wt gain, or increase in abdominal girth (pants fitting more tightly)? NO   4. Have you had any passing out spells? NO    *A YES to any of these questions would result in the appointment being kept.  *If all the answers to these questions are NO, we should indicate that given the current situation regarding the worldwide coronarvirus pandemic, at the recommendation of the CDC, we are looking to limit gatherings in our waiting area, and thus will reschedule their appointment beyond four weeks from today.    PATIENT WANTS TO KEEP APPOINTMENT

## 2019-02-18 ENCOUNTER — Ambulatory Visit: Payer: Federal, State, Local not specified - PPO | Admitting: Internal Medicine

## 2019-02-18 ENCOUNTER — Encounter: Payer: Self-pay | Admitting: Internal Medicine

## 2019-02-18 ENCOUNTER — Other Ambulatory Visit: Payer: Self-pay

## 2019-02-18 VITALS — BP 142/70 | HR 72 | Ht 67.0 in | Wt 248.6 lb

## 2019-02-18 DIAGNOSIS — I10 Essential (primary) hypertension: Secondary | ICD-10-CM

## 2019-02-18 DIAGNOSIS — E785 Hyperlipidemia, unspecified: Secondary | ICD-10-CM | POA: Diagnosis not present

## 2019-02-18 DIAGNOSIS — I25810 Atherosclerosis of coronary artery bypass graft(s) without angina pectoris: Secondary | ICD-10-CM

## 2019-02-18 DIAGNOSIS — I471 Supraventricular tachycardia: Secondary | ICD-10-CM

## 2019-02-18 NOTE — Progress Notes (Signed)
OFFICE NOTE  Chief Complaint:  Routine follow-up  Primary Care Physician: [name], MD  HPI:  Jeremiah [name] is a 73 y.o. 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 He is originally from [place] and his wife who is accompanying him today is originally from [place]. Mr. Austad had an MI in 2006 and underwent coronary artery bypass grafting with a LIMA to LAD, SVG to OM1, and SVG to RPDA. He did fairly well with this however 2015 was having chest discomfort. He underwent a nuclear stress test which showed possible reversible ischemia. Therefore he was referred for cardiac catheterization at [facility] [place]. That demonstrated all 3 patent bypass grafts with multivessel coronary disease. At that time he was also having episodes of palpitations and a monitor demonstrated PSVT. He was scheduled for possible ablation but is had chronic problems with an ankle fracture and nonhealing wound. He therefore never underwent ablation. He was placed on digoxin and has been on metoprolol but stopped the digoxin at some point in the past. He reports over the past 8-12 months she's had no further palpitations. He also has a history of dyslipidemia, hypothyroidism, and borderline diabetes. Recently he had a sleep study through [facility] which demonstrated severe obstructive sleep apnea and BiPAP was recommended with settings of 19/14 cm water pressure. Currently he denies any chest pain or worsening shortness of breath. His last echocardiogram from his cardiologist in [place] was a 2015 which showed an EF of 58% and no significant valvular disease.  07/26/2016  Mr. Mckelvy returns today for follow-up. He was recently seen in the emergency department in the beginning of June for chest pain. This was associated with tachycardia and probable SVT. Troponin was noted to be elevated to 0.12. He had signs and symptoms concerning for unstable angina. He was  evaluated by my partners and felt to need cardiac catheterization. Eventually he underwent cardiac catheterization by Dr. Tamala Julian with the results as follows:  Conclusion   1. LM lesion, 85% stenosed. 2. Ost 1st Mrg lesion, 100% stenosed. 3. SVG was injected . 4. Ost RCA to Prox RCA lesion, 100% stenosed. 5. SVG . 6. Origin to Prox Graft lesion, 40% stenosed. 7. Mid RCA to Dist RCA lesion, 100% stenosed. 8. Prox LAD lesion, 100% stenosed. 9. LIMA .    Patent saphenous vein grafts. SVG to PDA contains eccentric 40% proximal stenosis. SVG to the circumflex is widely patent.  LIMA to the LAD is widely patent.  Native distal left main contains 75% stenosis. The dominant obtuse marginal branch is totally occluded. The native right coronary is totally occluded.  Left ventriculogram demonstrates inferior wall hypokinesis. EF 50%.  Recent chest pain in the setting of PSVT with minimal enzyme elevation likely related to demand ischemia in the setting of underlying native coronary disease.  Recommendations:  Medical therapy.  Management of PSVT with medications versus ablation.   Since discharge she denies any further episodes of tachycardia palpitations. He is already on a high dose of beta blocker. He's had a small amount of weight gain but has stopped smoking.  He plans to start to do more exercise and work on weight loss. I'm concerned about his recurrent palpitations and the fact that asymptomatic and has demand ischemia related to them and bypass graft insufficiency.  06/10/2017  Mr. Eller returns today for follow-up. It's been almost a year since I last saw him. He was found to have recurrent symptomatic  SVT and underwent comprehensive EP study and was diagnosed with classic AV nodal reentry tachycardia and subsequently underwent selective radiofrequency ablation by Dr. Curt Bears. He says since that time he's had no further episodes of tachypalpitations. Since then he was hospitalized  twice, after developing C. difficile colitis and enteritis with GI bleed in January. Subsequently he developed acute congestive heart failure was found to have a mild reduction in LVEF to 45-50% by echo in March 2018. At that time he was on furosemide 40 mg daily. He had a follow-up with Almyra Deforest, PA-C in April who noted he was on Lasix 40 mg twice daily, however he reported that he was not taking that to me today. Fact he says he is now off of Lasix. He reports worsening shortness of breath and hasn't fact had a 13 pound weight gain since April. He denies any lower extremity edema. He has been working with Dr. Melvyn Novas for his shortness of breath.  07/22/2017  Mr. Bossard is back today for follow-up. He restarted his Lasix at my direction his weight is now down from 252-244. He's had significant improvement in swelling and reports some improvement in his breathing however he says it still not good. He does have a mildly reduced EF of 45-50% but also has some chronic lung disease which is likely contributing. He says he gets short of breath while doing certain activities in the yard and then comes inside and uses his oxygen which improves him almost immediately. He denies any orthopnea. He has not had any recent or productive cough. He has a follow-up visit with his pulmonologist tomorrow. I had ordered lab work including a metabolic profile and BNP which was never obtained.  09/09/2018  Mr. Radoncic is seen today in follow-up.  He reports some improvement in his shortness of breath.  He has joined a gym and now is exercising every day.  Weight however has been stable at 252, actually up 2 pounds from April.  Despite this his breathing has improved somewhat.  He still uses oxygen.  He did have one episode recently where he became nauseated and vomited.  He was up at the [place] and had been taking antibiotics for a dental infection.  He did undergo CT scan of the head and neck and that demonstrated bilateral  carotid artery calcification.  This is not surprising given his history of coronary artery disease and prior CABG however he has not had prior carotid Dopplers.    02/18/2019  Mr. Birr is seen today in follow-up.  Recently he says has had some improvement in shortness of breath.  Dr. Melvyn Novas, his pulmonologist had started him on a new nebulizer called revefenacin.  His most recent hemoglobin A1c was 7.1.  Weight is down about 4 pounds.  He denies any new chest pain.  He has had some recent anemia and work-up is apparently underway for this.  He is tentatively scheduled for an endoscopy on April 16 with Dr. Therisa Doyne.  He is concerned because of his pulmonary status that he may be at high risk of decompensation with the procedure.  He also has follow-up with his PCP in the near future.  PMHx:  Past Medical History:  Diagnosis Date  . A-fib ([place])   . CAD (coronary artery disease)    a. 2006: CABG in 2006 with LIMA-LAD, SVG-OM1, and SVG-RPDA  . Chronic pain 03/21/2016  . Essential hypertension 03/21/2016  . History of prostate cancer 03/21/2016  . HLD (hyperlipidemia)   .  HTN (hypertension)   . Hx of CABG 2006  . Hypercholesteremia 03/21/2016  . Hypothyroidism   . Medication management 03/21/2016  . Morbid obesity ([place]) 03/21/2016  . Myocardial infarct ([place])   . OSA (obstructive sleep apnea) 03/21/2016  . Pain in right ankle and joints of right foot 03/21/2016  . Paronychia 03/21/2016  . Primary insomnia 03/21/2016  . Prostate cancer ([place]) 2008  . Tobacco dependence 03/21/2016    Past Surgical History:  Procedure Laterality Date  . ANKLE SURGERY  12/2013  . CARDIAC CATHETERIZATION N/A 05/08/2016   Procedure: Left Heart Cath and Cors/Grafts Angiography;  Surgeon: Belva Crome, MD;  Location: [facility];  Service: Cardiovascular;  Laterality: N/A;  . CORONARY ARTERY BYPASS GRAFT  2006   x3  . ELECTROPHYSIOLOGIC STUDY N/A 08/24/2016   Procedure: SVT Ablation;  Surgeon: Will Meredith Leeds, MD;   Location: [facility];  Service: Cardiovascular;  Laterality: N/A;  . PROSTATE SURGERY  2008  . PTCA      FAMHx:  Family History  Problem Relation Age of Onset  . Dementia Mother   . Diabetes Sister     SOCHx:   reports that he quit smoking about 2 years ago. His smoking use included cigarettes. He has a 30.00 pack-year smoking history. He has never used smokeless tobacco. He reports current alcohol use. He reports that he does not use drugs.  ALLERGIES:  Allergies  Allergen Reactions  . Scallops [Shellfish Allergy] Other (See Comments)    Rash; shortness of breath  . Fluzone [Flu Virus Vaccine] Nausea And Vomiting, Palpitations and Rash    ROS: Pertinent items noted in HPI and remainder of comprehensive ROS otherwise negative.  HOME MEDS: Current Outpatient Medications  Medication Sig Dispense Refill  . albuterol (PROVENTIL) (2.5 MG/3ML) 0.083% nebulizer solution Take 3 mLs (2.5 mg total) by nebulization every 4 (four) hours as needed for wheezing or shortness of breath. 75 mL 12  . aspirin EC 81 MG tablet Take 81 mg by mouth daily.    Marland Kitchen atorvastatin (LIPITOR) 80 MG tablet Take 80 mg by mouth daily.    . betamethasone dipropionate 0.05 % lotion Apply 1 application topically 2 (two) times daily.     . bisoprolol (ZEBETA) 5 MG tablet Take 5 mg by mouth daily.    . ferrous sulfate 325 (65 FE) MG tablet Take 1 tablet (325 mg total) by mouth daily with breakfast. 30 tablet 1  . formoterol (PERFOROMIST) 20 MCG/2ML nebulizer solution Take 2 mLs (20 mcg total) by nebulization 2 (two) times daily. 120 mL 11  . furosemide (LASIX) 40 MG tablet Take 1.5 tablets (60 mg total) by mouth daily. 60 tablet 11  . glimepiride (AMARYL) 1 MG tablet Take 1 tablet by mouth daily.    Marland Kitchen levothyroxine (SYNTHROID, LEVOTHROID) 75 MCG tablet Take 75 mcg by mouth daily before breakfast.    . metFORMIN (GLUCOPHAGE) 500 MG tablet Take 750 mg by mouth 2 (two) times daily with a meal.     . OXYGEN 4 lpm  with 24/7 AHC    . pantoprazole (PROTONIX) 40 MG tablet Take 40 mg by mouth daily.    . potassium chloride SA (K-DUR,KLOR-CON) 20 MEQ tablet Take 1.5 tablets (30 mEq total) by mouth daily. (Patient taking differently: Take 20 mEq by mouth daily. ) 60 tablet 11  . Revefenacin (YUPELRI) 175 MCG/3ML SOLN Inhale 1 vial into the lungs daily. 90 mL 11  . traMADol (ULTRAM) 50 MG tablet  Take by mouth every 6 (six) hours as needed for moderate pain.     No current facility-administered medications for this visit.     LABS/IMAGING: No results found for this or any previous visit (from the past 48 hour(s)). No results found.  WEIGHTS: Wt Readings from Last 3 Encounters:  02/18/19 248 lb 9.6 oz (112.8 kg)  02/05/19 251 lb (113.9 kg)  01/06/19 252 lb (114.3 kg)    VITALS: BP (!) 142/70   Pulse 72   Ht 5\' 7"  (1.702 m)   Wt 248 lb 9.6 oz (112.8 kg)   BMI 38.94 kg/m   EXAM: General appearance: alert, no distress and moderately obese Neck: no carotid bruit and no JVD Lungs: diminished breath sounds bilaterally and wheezes bilaterally Heart: regular rate and rhythm, S1, S2 normal, no murmur, click, rub or gallop Abdomen: soft, non-tender; bowel sounds normal; no masses,  no organomegaly Extremities: extremities normal, atraumatic, no cyanosis or edema Pulses: 2+ and symmetric Skin: Skin color, texture, turgor normal. No rashes or lesions Neurologic: Grossly normal Psych: Pleasant  EKG: Normal sinus rhythm with sinus arrhythmia at 72, possible left atrial enlargement, inferior T wave changes-stable, personally reviewed  ASSESSMENT: 1. Recent SVT which was symptomatic-repeat cardiac catheterization (05/2016) shows patent grafts, status post ablation of AV nodal reentry tachycardia (08/2016) 2. CAD status post three-vessel CABG in 2006 (LIMA to LAD, SVG to OM1, SVG to RPDA) in [place] 3. Patent bypass grafts by cath in 2015 4. History of PSVT-on metoprolol 5. Dyslipidemia on Lipitor 6.  OSA-BiPAP recommended 7. Acute on chronic systolic congestive heart failure-LVEF 45-50%  PLAN: 1.   Mr. Mccanless seems to be breathing somewhat better.  He has no new cardiac chest pain symptoms.  He is now about 14 years since bypass.  He had cath in 2015 which showed patent grafts.  He has had no recurrent SVT.  Cholesterol is well controlled about a year ago.  He will have repeat labs through his PCP coming up soon.  He remains on oxygen.  No changes were made to his medicines today.  Follow-up with me in 6 months or sooner as necessary.  Pixie Casino, MD, [facility], [place] Director of the Advanced Lipid Disorders &  Cardiovascular Risk Reduction Clinic Diplomate of the American Board of Clinical Lipidology Attending Cardiologist  Direct Dial: [phone]  Fax: [phone]  Website:  [email or web address] 02/18/2019, 9:51 AM

## 2019-02-18 NOTE — Patient Instructions (Signed)
Medication Instructions:  Not needed If you need a refill on your cardiac medications before your next appointment, please call your pharmacy.   Lab work: Not needed If you have labs (blood work) drawn today and your tests are completely normal, you will receive your results only by: Marland Kitchen MyChart Message (if you have MyChart) OR . A paper copy in the mail If you have any lab test that is abnormal or we need to change your treatment, we will call you to review the results.  Testing/Procedures: Not needed  Follow-Up: At [facility], you and your health needs are our priority.  As part of our continuing mission to provide you with exceptional heart care, we have created designated Provider Care Teams.  These Care Teams include your primary Cardiologist (physician) and Advanced Practice Providers (APPs -  Physician Assistants and Nurse Practitioners) who all work together to provide you with the care you need, when you need it. You will need a follow up appointment in 6 months.  Please call our office 2 months in advance to schedule this appointment.  You may see Pixie Casino, MD or one of the following Advanced Practice Providers on your designated Care Team: [place], [place] . Fabian Sharp, PA-C  Any Other Special Instructions Will Be Listed Below (If Applicable).

## 2019-02-27 DIAGNOSIS — I5033 Acute on chronic diastolic (congestive) heart failure: Secondary | ICD-10-CM | POA: Diagnosis not present

## 2019-02-27 DIAGNOSIS — G4733 Obstructive sleep apnea (adult) (pediatric): Secondary | ICD-10-CM | POA: Diagnosis not present

## 2019-03-04 ENCOUNTER — Other Ambulatory Visit: Payer: Self-pay | Admitting: Physician Assistant

## 2019-03-19 ENCOUNTER — Encounter (HOSPITAL_COMMUNITY): Admission: RE | Payer: Self-pay | Source: Home / Self Care

## 2019-03-19 ENCOUNTER — Ambulatory Visit (HOSPITAL_COMMUNITY)
Admission: RE | Admit: 2019-03-19 | Payer: Federal, State, Local not specified - PPO | Source: Home / Self Care | Admitting: Gastroenterology

## 2019-03-19 SURGERY — EGD (ESOPHAGOGASTRODUODENOSCOPY)
Anesthesia: Monitor Anesthesia Care

## 2019-03-23 DIAGNOSIS — R300 Dysuria: Secondary | ICD-10-CM | POA: Diagnosis not present

## 2019-03-23 DIAGNOSIS — N471 Phimosis: Secondary | ICD-10-CM | POA: Diagnosis not present

## 2019-03-30 DIAGNOSIS — G4733 Obstructive sleep apnea (adult) (pediatric): Secondary | ICD-10-CM | POA: Diagnosis not present

## 2019-03-30 DIAGNOSIS — I5033 Acute on chronic diastolic (congestive) heart failure: Secondary | ICD-10-CM | POA: Diagnosis not present

## 2019-04-13 ENCOUNTER — Other Ambulatory Visit: Payer: Self-pay | Admitting: Internal Medicine

## 2019-04-14 DIAGNOSIS — Z79899 Other long term (current) drug therapy: Secondary | ICD-10-CM | POA: Diagnosis not present

## 2019-04-14 DIAGNOSIS — D500 Iron deficiency anemia secondary to blood loss (chronic): Secondary | ICD-10-CM | POA: Diagnosis not present

## 2019-04-14 DIAGNOSIS — E039 Hypothyroidism, unspecified: Secondary | ICD-10-CM | POA: Diagnosis not present

## 2019-04-14 DIAGNOSIS — E1165 Type 2 diabetes mellitus with hyperglycemia: Secondary | ICD-10-CM | POA: Diagnosis not present

## 2019-04-14 DIAGNOSIS — Z6841 Body Mass Index (BMI) 40.0 and over, adult: Secondary | ICD-10-CM | POA: Diagnosis not present

## 2019-04-16 DIAGNOSIS — I10 Essential (primary) hypertension: Secondary | ICD-10-CM | POA: Diagnosis not present

## 2019-04-16 DIAGNOSIS — E039 Hypothyroidism, unspecified: Secondary | ICD-10-CM | POA: Diagnosis not present

## 2019-04-16 DIAGNOSIS — Z8546 Personal history of malignant neoplasm of prostate: Secondary | ICD-10-CM | POA: Diagnosis not present

## 2019-04-20 ENCOUNTER — Other Ambulatory Visit: Payer: Self-pay | Admitting: Internal Medicine

## 2019-04-20 MED ORDER — BISOPROLOL FUMARATE 5 MG PO TABS: ORAL_TABLET | ORAL | 0 refills | Status: DC

## 2019-04-22 ENCOUNTER — Other Ambulatory Visit: Payer: Self-pay | Admitting: Urology

## 2019-04-28 ENCOUNTER — Other Ambulatory Visit: Payer: Self-pay | Admitting: Internal Medicine

## 2019-04-28 MED ORDER — BISOPROLOL FUMARATE 5 MG PO TABS: ORAL_TABLET | ORAL | 0 refills | Status: DC

## 2019-04-29 DIAGNOSIS — I5033 Acute on chronic diastolic (congestive) heart failure: Secondary | ICD-10-CM | POA: Diagnosis not present

## 2019-04-29 DIAGNOSIS — G4733 Obstructive sleep apnea (adult) (pediatric): Secondary | ICD-10-CM | POA: Diagnosis not present

## 2019-05-30 DIAGNOSIS — I5033 Acute on chronic diastolic (congestive) heart failure: Secondary | ICD-10-CM | POA: Diagnosis not present

## 2019-05-30 DIAGNOSIS — G4733 Obstructive sleep apnea (adult) (pediatric): Secondary | ICD-10-CM | POA: Diagnosis not present

## 2019-05-31 ENCOUNTER — Other Ambulatory Visit: Payer: Self-pay | Admitting: Internal Medicine

## 2019-06-01 NOTE — Telephone Encounter (Signed)
Rx(s) sent to pharmacy electronically.  

## 2019-06-11 ENCOUNTER — Other Ambulatory Visit: Payer: Self-pay | Admitting: Gastroenterology

## 2019-06-16 ENCOUNTER — Other Ambulatory Visit: Payer: Self-pay | Admitting: Gastroenterology

## 2019-06-29 DIAGNOSIS — G4733 Obstructive sleep apnea (adult) (pediatric): Secondary | ICD-10-CM | POA: Diagnosis not present

## 2019-06-29 DIAGNOSIS — I5033 Acute on chronic diastolic (congestive) heart failure: Secondary | ICD-10-CM | POA: Diagnosis not present

## 2019-07-14 ENCOUNTER — Other Ambulatory Visit (HOSPITAL_COMMUNITY): Payer: Federal, State, Local not specified - PPO

## 2019-07-17 ENCOUNTER — Encounter (HOSPITAL_COMMUNITY): Admission: RE | Payer: Self-pay | Source: Home / Self Care

## 2019-07-17 ENCOUNTER — Ambulatory Visit (HOSPITAL_COMMUNITY)
Admission: RE | Admit: 2019-07-17 | Payer: Federal, State, Local not specified - PPO | Source: Home / Self Care | Admitting: Gastroenterology

## 2019-07-17 SURGERY — COLONOSCOPY WITH PROPOFOL
Anesthesia: Monitor Anesthesia Care

## 2019-07-30 DIAGNOSIS — G4733 Obstructive sleep apnea (adult) (pediatric): Secondary | ICD-10-CM | POA: Diagnosis not present

## 2019-07-30 DIAGNOSIS — I5033 Acute on chronic diastolic (congestive) heart failure: Secondary | ICD-10-CM | POA: Diagnosis not present

## 2019-07-31 DIAGNOSIS — I4891 Unspecified atrial fibrillation: Secondary | ICD-10-CM | POA: Diagnosis not present

## 2019-07-31 DIAGNOSIS — Z6841 Body Mass Index (BMI) 40.0 and over, adult: Secondary | ICD-10-CM | POA: Diagnosis not present

## 2019-07-31 DIAGNOSIS — E7800 Pure hypercholesterolemia, unspecified: Secondary | ICD-10-CM | POA: Diagnosis not present

## 2019-07-31 DIAGNOSIS — E782 Mixed hyperlipidemia: Secondary | ICD-10-CM | POA: Diagnosis not present

## 2019-07-31 DIAGNOSIS — I5022 Chronic systolic (congestive) heart failure: Secondary | ICD-10-CM | POA: Diagnosis not present

## 2019-07-31 DIAGNOSIS — E1169 Type 2 diabetes mellitus with other specified complication: Secondary | ICD-10-CM | POA: Diagnosis not present

## 2019-07-31 DIAGNOSIS — I10 Essential (primary) hypertension: Secondary | ICD-10-CM | POA: Diagnosis not present

## 2019-08-24 ENCOUNTER — Ambulatory Visit (HOSPITAL_BASED_OUTPATIENT_CLINIC_OR_DEPARTMENT_OTHER): Admit: 2019-08-24 | Payer: Federal, State, Local not specified - PPO | Admitting: Urology

## 2019-08-24 ENCOUNTER — Encounter (HOSPITAL_BASED_OUTPATIENT_CLINIC_OR_DEPARTMENT_OTHER): Payer: Self-pay

## 2019-08-24 DIAGNOSIS — Z20828 Contact with and (suspected) exposure to other viral communicable diseases: Secondary | ICD-10-CM | POA: Diagnosis not present

## 2019-08-24 SURGERY — CIRCUMCISION, ADULT
Anesthesia: Choice

## 2019-08-30 DIAGNOSIS — I5033 Acute on chronic diastolic (congestive) heart failure: Secondary | ICD-10-CM | POA: Diagnosis not present

## 2019-08-30 DIAGNOSIS — G4733 Obstructive sleep apnea (adult) (pediatric): Secondary | ICD-10-CM | POA: Diagnosis not present

## 2019-09-14 ENCOUNTER — Other Ambulatory Visit: Payer: Self-pay

## 2019-09-14 ENCOUNTER — Encounter: Payer: Self-pay | Admitting: Internal Medicine

## 2019-09-14 ENCOUNTER — Ambulatory Visit: Payer: Federal, State, Local not specified - PPO | Admitting: Internal Medicine

## 2019-09-14 VITALS — BP 144/81 | HR 65 | Ht 68.0 in | Wt 250.8 lb

## 2019-09-14 DIAGNOSIS — E785 Hyperlipidemia, unspecified: Secondary | ICD-10-CM

## 2019-09-14 DIAGNOSIS — Z79899 Other long term (current) drug therapy: Secondary | ICD-10-CM

## 2019-09-14 DIAGNOSIS — I10 Essential (primary) hypertension: Secondary | ICD-10-CM

## 2019-09-14 DIAGNOSIS — I5032 Chronic diastolic (congestive) heart failure: Secondary | ICD-10-CM

## 2019-09-14 DIAGNOSIS — N471 Phimosis: Secondary | ICD-10-CM

## 2019-09-14 DIAGNOSIS — J449 Chronic obstructive pulmonary disease, unspecified: Secondary | ICD-10-CM

## 2019-09-14 MED ORDER — EZETIMIBE 10 MG PO TABS: 10.0000 mg | ORAL_TABLET | Freq: Every day | ORAL | 3 refills | Status: DC

## 2019-09-14 NOTE — Patient Instructions (Signed)
Medication Instructions:  Dr Debara Pickett has recommended making the following medication changes: 1. START Ezetimibe (Zetia) 10 mg - take 1 tablet daily  If you need a refill on your cardiac medications before your next appointment, please call your pharmacy.   Lab work: Your physician recommends that you return for a FASTING lipid profile in 3 months.  If you have labs (blood work) drawn today and your tests are completely normal, you will receive your results only by: Marland Kitchen MyChart Message (if you have MyChart) OR . A paper copy in the mail If you have any lab test that is abnormal or we need to change your treatment, we will call you to review the results.  Follow-Up: At [facility], you and your health needs are our priority.  As part of our continuing mission to provide you with exceptional heart care, we have created designated Provider Care Teams.  These Care Teams include your primary Cardiologist (physician) and Advanced Practice Providers (APPs -  Physician Assistants and Nurse Practitioners) who all work together to provide you with the care you need, when you need it. You will need a follow up appointment in 6 months.  Please call our office 2 months in advance to schedule this appointment.  You may see Pixie Casino, MD or one of the following Advanced Practice Providers on your designated Care Team: [place], [place] . Fabian Sharp, PA-C . You will receive a reminder letter in the mail two months in advance. If you don't receive a letter, please call our office to schedule the follow-up appointment.   You have been referred to Dr Jearl Klinefelter at [facility].

## 2019-09-14 NOTE — Progress Notes (Signed)
OFFICE NOTE  Chief Complaint:  Routine follow-up  Primary Care Physician: [name], MD  HPI:  Jeremiah [name] is a 73 y.o. 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 He is originally from [place] and his wife who is accompanying him today is originally from [place]. [name] had an MI in 2006 and underwent coronary artery bypass grafting with a LIMA to LAD, SVG to OM1, and SVG to RPDA. He did fairly well with this however 2015 was having chest discomfort. He underwent a nuclear stress test which showed possible reversible ischemia. Therefore he was referred for cardiac catheterization at [facility] [place]. That demonstrated all 3 patent bypass grafts with multivessel coronary disease. At that time he was also having episodes of palpitations and a monitor demonstrated PSVT. He was scheduled for possible ablation but is had chronic problems with an ankle fracture and nonhealing wound. He therefore never underwent ablation. He was placed on digoxin and has been on metoprolol but stopped the digoxin at some point in the past. He reports over the past 8-12 months she's had no further palpitations. He also has a history of dyslipidemia, hypothyroidism, and borderline diabetes. Recently he had a sleep study through [facility] which demonstrated severe obstructive sleep apnea and BiPAP was recommended with settings of 19/14 cm water pressure. Currently he denies any chest pain or worsening shortness of breath. His last echocardiogram from his cardiologist in [place] was a 2015 which showed an EF of 58% and no significant valvular disease.  07/26/2016  [name] returns today for follow-up. He was recently seen in the emergency department in the beginning of June for chest pain. This was associated with tachycardia and probable SVT. Troponin was noted to be elevated to 0.12. He had signs and symptoms concerning for unstable angina. He was evaluated  by my partners and felt to need cardiac catheterization. Eventually he underwent cardiac catheterization by Dr. Tamala Julian with the results as follows:  Conclusion   1. LM lesion, 85% stenosed. 2. Ost 1st Mrg lesion, 100% stenosed. 3. SVG was injected . 4. Ost RCA to Prox RCA lesion, 100% stenosed. 5. SVG . 6. Origin to Prox Graft lesion, 40% stenosed. 7. Mid RCA to Dist RCA lesion, 100% stenosed. 8. Prox LAD lesion, 100% stenosed. 9. LIMA .    Patent saphenous vein grafts. SVG to PDA contains eccentric 40% proximal stenosis. SVG to the circumflex is widely patent.  LIMA to the LAD is widely patent.  Native distal left main contains 75% stenosis. The dominant obtuse marginal branch is totally occluded. The native right coronary is totally occluded.  Left ventriculogram demonstrates inferior wall hypokinesis. EF 50%.  Recent chest pain in the setting of PSVT with minimal enzyme elevation likely related to demand ischemia in the setting of underlying native coronary disease.  Recommendations:  Medical therapy.  Management of PSVT with medications versus ablation.   Since discharge she denies any further episodes of tachycardia palpitations. He is already on a high dose of beta blocker. He's had a small amount of weight gain but has stopped smoking.  He plans to start to do more exercise and work on weight loss. I'm concerned about his recurrent palpitations and the fact that asymptomatic and has demand ischemia related to them and bypass graft insufficiency.  06/10/2017  [name] returns today for follow-up. It's been almost a year since I last saw him. He was found to have recurrent symptomatic  SVT and underwent comprehensive EP study and was diagnosed with classic AV nodal reentry tachycardia and subsequently underwent selective radiofrequency ablation by Dr. Curt Bears. He says since that time he's had no further episodes of tachypalpitations. Since then he was hospitalized twice, after  developing C. difficile colitis and enteritis with GI bleed in January. Subsequently he developed acute congestive heart failure was found to have a mild reduction in LVEF to 45-50% by echo in March 2018. At that time he was on furosemide 40 mg daily. He had a follow-up with Almyra Deforest, PA-C in April who noted he was on Lasix 40 mg twice daily, however he reported that he was not taking that to me today. Fact he says he is now off of Lasix. He reports worsening shortness of breath and hasn't fact had a 13 pound weight gain since April. He denies any lower extremity edema. He has been working with Dr. Melvyn Novas for his shortness of breath.  07/22/2017  [name] is back today for follow-up. He restarted his Lasix at my direction his weight is now down from 252-244. He's had significant improvement in swelling and reports some improvement in his breathing however he says it still not good. He does have a mildly reduced EF of 45-50% but also has some chronic lung disease which is likely contributing. He says he gets short of breath while doing certain activities in the yard and then comes inside and uses his oxygen which improves him almost immediately. He denies any orthopnea. He has not had any recent or productive cough. He has a follow-up visit with his pulmonologist tomorrow. I had ordered lab work including a metabolic profile and BNP which was never obtained.  09/09/2018  [name] is seen today in follow-up.  He reports some improvement in his shortness of breath.  He has joined a gym and now is exercising every day.  Weight however has been stable at 252, actually up 2 pounds from April.  Despite this his breathing has improved somewhat.  He still uses oxygen.  He did have one episode recently where he became nauseated and vomited.  He was up at the [facility] and had been taking antibiotics for a dental infection.  He did undergo CT scan of the head and neck and that demonstrated bilateral carotid  artery calcification.  This is not surprising given his history of coronary artery disease and prior CABG however he has not had prior carotid Dopplers.    02/18/2019  Mr. Derck is seen today in follow-up.  Recently he says has had some improvement in shortness of breath.  Dr. Melvyn Novas, his pulmonologist had started him on a new nebulizer called revefenacin.  His most recent hemoglobin A1c was 7.1.  Weight is down about 4 pounds.  He denies any new chest pain.  He has had some recent anemia and work-up is apparently underway for this.  He is tentatively scheduled for an endoscopy on April 16 with Dr. Therisa Doyne.  He is concerned because of his pulmonary status that he may be at high risk of decompensation with the procedure.  He also has follow-up with his PCP in the near future.  09/14/2019  Mr. Dozier returns today for follow-up.  He continues to do well.  His shortness of breath has improved which I think was both believed due to a combination of pulmonary optimization as well as improvement in LV function.  Recently his echo in fact showed LVEF has normalized to 60 to 65%.  He remains euvolemic and is actually 2 pounds lighter than he was seen about 8 months ago.  Hemoglobin A1c has been reasonably well-controlled however his lipids are still uncontrolled.  His total cholesterol is 211, triglycerides 494, HDL 32 and LDL of 114.  His target LDL is less than 70 and his elevated triglycerides also put him at increased cardiovascular risk.  PMHx:  Past Medical History:  Diagnosis Date   A-fib [facility])    CAD (coronary artery disease)    a. 2006: CABG in 2006 with LIMA-LAD, SVG-OM1, and SVG-RPDA   Chronic pain 03/21/2016   Essential hypertension 03/21/2016   History of prostate cancer 03/21/2016   HLD (hyperlipidemia)    HTN (hypertension)    Hx of CABG 2006   Hypercholesteremia 03/21/2016   Hypothyroidism    Medication management 03/21/2016   Morbid obesity ([facility]) 03/21/2016   Myocardial infarct  (HCC)    OSA (obstructive sleep apnea) 03/21/2016   Pain in right ankle and joints of right foot 03/21/2016   Paronychia 03/21/2016   Primary insomnia 03/21/2016   Prostate cancer ([facility]) 2008   Tobacco dependence 03/21/2016    Past Surgical History:  Procedure Laterality Date   ANKLE SURGERY  12/2013   CARDIAC CATHETERIZATION N/A 05/08/2016   Procedure: Left Heart Cath and Cors/Grafts Angiography;  Surgeon: Belva Crome, MD;  Location: [facility];  Service: Cardiovascular;  Laterality: N/A;   CORONARY ARTERY BYPASS GRAFT  2006   x3   ELECTROPHYSIOLOGIC STUDY N/A 08/24/2016   Procedure: SVT Ablation;  Surgeon: Will Meredith Leeds, MD;  Location: [facility];  Service: Cardiovascular;  Laterality: N/A;   PROSTATE SURGERY  2008   PTCA      FAMHx:  Family History  Problem Relation Age of Onset   Dementia Mother    Diabetes Sister     SOCHx:   reports that he quit smoking about 2 years ago. His smoking use included cigarettes. He has a 30.00 pack-year smoking history. He has never used smokeless tobacco. He reports current alcohol use. He reports that he does not use drugs.  ALLERGIES:  Allergies  Allergen Reactions   Scallops [Shellfish Allergy] Other (See Comments)    Rash; shortness of breath   Fluzone [Flu Virus Vaccine] Nausea And Vomiting, Palpitations and Rash    ROS: Pertinent items noted in HPI and remainder of comprehensive ROS otherwise negative.  HOME MEDS: Current Outpatient Medications  Medication Sig Dispense Refill   albuterol (PROVENTIL) (2.5 MG/3ML) 0.083% nebulizer solution Take 3 mLs (2.5 mg total) by nebulization every 4 (four) hours as needed for wheezing or shortness of breath. 75 mL 12   atorvastatin (LIPITOR) 80 MG tablet Take 80 mg by mouth daily.     betamethasone dipropionate 0.05 % lotion Apply 1 application topically 2 (two) times daily.      bisoprolol (ZEBETA) 5 MG tablet TAKE 1 TABLET(5 MG) BY MOUTH DAILY 90  tablet 0   ferrous sulfate (FEROSUL) 325 (65 FE) MG tablet Take 1 tablet (325 mg total) by mouth daily with breakfast. 30 tablet 9   formoterol (PERFOROMIST) 20 MCG/2ML nebulizer solution Take 2 mLs (20 mcg total) by nebulization 2 (two) times daily. 120 mL 11   furosemide (LASIX) 40 MG tablet Take 1.5 tablets (60 mg total) by mouth daily. (Patient taking differently: Take 40 mg by mouth daily. ) 60 tablet 11   glimepiride (AMARYL) 2 MG tablet Take 2 mg by mouth daily with breakfast.  levothyroxine (SYNTHROID, LEVOTHROID) 88 MCG tablet Take 88 mcg by mouth daily before breakfast.     metFORMIN (GLUCOPHAGE-XR) 500 MG 24 hr tablet Take 1,500 mg by mouth daily with breakfast.     OXYGEN 4 lpm with 24/7 AHC     pantoprazole (PROTONIX) 40 MG tablet Take 40 mg by mouth daily.     potassium chloride (KLOR-CON) 20 MEQ packet Take 20 mEq by mouth daily.     Revefenacin (YUPELRI) 175 MCG/3ML SOLN Inhale 1 vial into the lungs daily. 90 mL 11   traZODone (DESYREL) 50 MG tablet Take 50 mg by mouth at bedtime as needed for sleep.      No current facility-administered medications for this visit.     LABS/IMAGING: No results found for this or any previous visit (from the past 48 hour(s)). No results found.  WEIGHTS: Wt Readings from Last 3 Encounters:  09/14/19 250 lb 12.8 oz (113.8 kg)  02/18/19 248 lb 9.6 oz (112.8 kg)  02/05/19 251 lb (113.9 kg)    VITALS: BP (!) 144/81    Pulse 65    Ht 5\' 8"  (1.727 m)    Wt 250 lb 12.8 oz (113.8 kg)    SpO2 92%    BMI 38.13 kg/m   EXAM: General appearance: alert, no distress and moderately obese Neck: no carotid bruit and no JVD Lungs: diminished breath sounds bilaterally and wheezes bilaterally Heart: regular rate and rhythm, S1, S2 normal, no murmur, click, rub or gallop Abdomen: soft, non-tender; bowel sounds normal; no masses,  no organomegaly Extremities: extremities normal, atraumatic, no cyanosis or edema Pulses: 2+ and  symmetric Skin: Skin color, texture, turgor normal. No rashes or lesions Neurologic: Grossly normal Psych: Pleasant  EKG: Normal sinus rhythm 65, inferolateral T wave changes-personally reviewed  ASSESSMENT: 1. Recent SVT which was symptomatic-repeat cardiac catheterization (05/2016) shows patent grafts, status post ablation of AV nodal reentry tachycardia (08/2016) 2. CAD status post three-vessel CABG in 2006 (LIMA to LAD, SVG to OM1, SVG to RPDA) in [place] 3. Patent bypass grafts by cath in 2015 4. History of PSVT-on metoprolol 5. Dyslipidemia on Lipitor 6. OSA-BiPAP recommended 7. COPD 8. Chronic systolic congestive heart failure-LVEF 45-50% -improved to 60 to 65% (12/2018)  9. Phimosis  PLAN: 1.   Mr. Macadams has had improvement in his LVEF to normal as of January this year.  He remains euvolemic.  His breathing has improved after working with pulmonary.  His lipids however lead more to be desired.  LDL is above target less than 70 and triglycerides are markedly elevated.  I am recommending adding ezetimibe 10 mg daily to try to target LDL to goal.  Ultimately he may also be a candidate to add Vascepa.  Finally he was complaining about an issue with phimosis.  [facility] however had some differences with him and is interested in seeing a different provider.  I will recommend Dr. Louis Meckel also at [facility].   Follow-up with me in 6 months.  We will repeat lipids in 3 months.  Pixie Casino, MD, [facility], [place] Director of the Advanced Lipid Disorders &  Cardiovascular Risk Reduction Clinic Diplomate of the American Board of Clinical Lipidology Attending Cardiologist  Direct Dial: [phone]   Fax: [phone]  Website:  [email or web address] [email or web address] Daine Croker 09/14/2019, 1:55 PM

## 2019-09-15 ENCOUNTER — Encounter: Payer: Self-pay | Admitting: Internal Medicine

## 2019-09-17 ENCOUNTER — Telehealth: Payer: Self-pay | Admitting: Internal Medicine

## 2019-09-17 NOTE — Telephone Encounter (Signed)
° ° °  [facility] calling to request records, ref sent by Dr Debara Pickett Please fax office note/ labs to [facility] at [phone] Patient has appt 10/26

## 2019-09-28 DIAGNOSIS — N471 Phimosis: Secondary | ICD-10-CM | POA: Diagnosis not present

## 2019-09-29 ENCOUNTER — Other Ambulatory Visit: Payer: Self-pay | Admitting: Urology

## 2019-09-29 DIAGNOSIS — I5033 Acute on chronic diastolic (congestive) heart failure: Secondary | ICD-10-CM | POA: Diagnosis not present

## 2019-09-29 DIAGNOSIS — G4733 Obstructive sleep apnea (adult) (pediatric): Secondary | ICD-10-CM | POA: Diagnosis not present

## 2019-09-30 ENCOUNTER — Encounter (HOSPITAL_COMMUNITY): Payer: Self-pay

## 2019-09-30 ENCOUNTER — Telehealth: Payer: Self-pay | Admitting: Internal Medicine

## 2019-09-30 NOTE — Telephone Encounter (Signed)
Left message for the patient to call back and speak to the on-call preop APP of the day 

## 2019-09-30 NOTE — Patient Instructions (Addendum)
DUE TO COVID-19 ONLY ONE VISITOR IS ALLOWED TO COME WITH YOU AND STAY IN THE WAITING ROOM ONLY DURING PRE OP AND PROCEDURE. THE ONE VISITOR MAY VISIT WITH YOU IN YOUR PRIVATE ROOM DURING VISITING HOURS ONLY!!   COVID SWAB TESTING MUST BE COMPLETED ON:   Saturday, Oct. 31, 2020 at  [address], [place] [facility] enter pre surgical testing line (Must self quarantine after testing. Follow instructions on handout.)             Your procedure is scheduled on: Wednesday, Oct. 4, 2020   Report to [facility] Main  Entrance    Report to admitting at 8:00 AM   Call this number if you have problems the morning of surgery [phone]   Do not eat food or drink liquids :After Midnight.   Brush your teeth the morning of surgery.   Do NOT smoke after Midnight   Take these medicines the morning of surgery with A SIP OF WATER: Atorvastatin, Bisoprolol, Ezetimibe, Levothyroxine, Pantoprazole   Use Nebulizer the morning of surgery   Bring rescue inhaler day of surgery   DO NOT TAKE ANY DIABETIC MEDICATIONS DAY OF YOUR SURGERY                               You may not have any metal on your body including jewelry, and body piercings             Do not wear lotions, powders, perfumes/cologne, or deodorant                          Men may shave face and neck.   Do not bring valuables to the hospital. [place].   Contacts, dentures or bridgework may not be worn into surgery.    Patients discharged the day of surgery will not be allowed to drive home.   Special Instructions: Bring a copy of your healthcare power of attorney and living will documents         the day of surgery if you haven't scanned them in before.              Please read over the following fact sheets you were given:  How to Manage Your Diabetes Before and After Surgery  Why is it important to control my blood sugar before and after  surgery? . Improving blood sugar levels before and after surgery helps healing and can limit problems. . A way of improving blood sugar control is eating a healthy diet by: o  Eating less sugar and carbohydrates o  Increasing activity/exercise o  Talking with your doctor about reaching your blood sugar goals . High blood sugars (greater than 180 mg/dL) can raise your risk of infections and slow your recovery, so you will need to focus on controlling your diabetes during the weeks before surgery. . Make sure that the doctor who takes care of your diabetes knows about your planned surgery including the date and location.  How do I manage my blood sugar before surgery? . Check your blood sugar at least 4 times a day, starting 2 days before surgery, to make sure that the level is not too high or low. o Check your blood sugar the morning of your surgery when  you wake up and every 2 hours until you get to the Short Stay unit. . If your blood sugar is less than 70 mg/dL, you will need to treat for low blood sugar: o Do not take insulin. o Treat a low blood sugar (less than 70 mg/dL) with  cup of clear juice (cranberry or apple), 4 glucose tablets, OR glucose gel. o Recheck blood sugar in 15 minutes after treatment (to make sure it is greater than 70 mg/dL). If your blood sugar is not greater than 70 mg/dL on recheck, call [phone] for further instructions. . Report your blood sugar to the short stay nurse when you get to Short Stay.  . If you are admitted to the hospital after surgery: o Your blood sugar will be checked by the staff and you will probably be given insulin after surgery (instead of oral diabetes medicines) to make sure you have good blood sugar levels. o The goal for blood sugar control after surgery is 80-180 mg/dL.   WHAT DO I DO ABOUT MY DIABETES MEDICATION?  Marland Kitchen Do not take oral diabetes medicines (pills) the morning of surgery.    Reviewed and Endorsed by [facility]  Patient Education Committee, August 2015 [facility] - Preparing for Surgery Before surgery, you can play an important role.  Because skin is not sterile, your skin needs to be as free of germs as possible.  You can reduce the number of germs on your skin by washing with CHG (chlorahexidine gluconate) soap before surgery.  CHG is an antiseptic cleaner which kills germs and bonds with the skin to continue killing germs even after washing. Please DO NOT use if you have an allergy to CHG or antibacterial soaps.  If your skin becomes reddened/irritated stop using the CHG and inform your nurse when you arrive at Short Stay. Do not shave (including legs and underarms) for at least 48 hours prior to the first CHG shower.  You may shave your face/neck.  Please follow these instructions carefully:  1.  Shower with CHG Soap the night before surgery and the  morning of surgery.  2.  If you choose to wash your hair, wash your hair first as usual with your normal  shampoo.  3.  After you shampoo, rinse your hair and body thoroughly to remove the shampoo.                             4.  Use CHG as you would any other liquid soap.  You can apply chg directly to the skin and wash.  Gently with a scrungie or clean washcloth.  5.  Apply the CHG Soap to your body ONLY FROM THE NECK DOWN.   Do   not use on face/ open                           Wound or open sores. Avoid contact with eyes, ears mouth and   genitals (private parts).                       Wash face,  Genitals (private parts) with your normal soap.             6.  Wash thoroughly, paying special attention to the area where your    surgery  will be performed.  7.  Thoroughly rinse your body with warm water from the  neck down.  8.  DO NOT shower/wash with your normal soap after using and rinsing off the CHG Soap.                9.  Pat yourself dry with a clean towel.            10.  Wear clean pajamas.            11.  Place clean sheets on your bed the  night of your first shower and do not  sleep with pets. Day of Surgery : Do not apply any lotions/deodorants the morning of surgery.  Please wear clean clothes to the hospital/surgery center.  FAILURE TO FOLLOW THESE INSTRUCTIONS MAY RESULT IN THE CANCELLATION OF YOUR SURGERY  PATIENT SIGNATURE_________________________________  NURSE SIGNATURE__________________________________  ________________________________________________________________________

## 2019-09-30 NOTE — Telephone Encounter (Signed)
° ° °  ° °   [facility] Pre-operative Risk Assessment    Request for surgical clearance:  1. What type of surgery is being performed? CIRCUMCISION   2. When is this surgery scheduled? 10/17/19  3. What type of clearance is required (medical clearance vs. Pharmacy clearance to hold med vs. Both)?BOTH  4. Are there any medications that need to be held prior to surgery and how long?  5. Practice name and name of physician performing surgery? [facility]  6. What is your office phone number [phone]   7.   What is your office fax number [phone]  8.   Anesthesia type (None, local, MAC, general) ? MAC   Jeremiah [name] [place] 09/30/2019, 1:03 PM  _________________________________________________________________   (provider comments below)

## 2019-10-01 ENCOUNTER — Other Ambulatory Visit: Payer: Self-pay

## 2019-10-01 ENCOUNTER — Encounter (HOSPITAL_COMMUNITY): Payer: Self-pay

## 2019-10-01 ENCOUNTER — Encounter (HOSPITAL_COMMUNITY)
Admission: RE | Admit: 2019-10-01 | Discharge: 2019-10-01 | Disposition: A | Discharge status: Home / Self Care | Payer: Federal, State, Local not specified - PPO | Source: Ambulatory Visit | Attending: Urology | Admitting: Urology

## 2019-10-01 DIAGNOSIS — Z01812 Encounter for preprocedural laboratory examination: Secondary | ICD-10-CM | POA: Insufficient documentation

## 2019-10-01 DIAGNOSIS — N471 Phimosis: Secondary | ICD-10-CM | POA: Insufficient documentation

## 2019-10-01 HISTORY — DX: Personal history of other medical treatment: Z92.89

## 2019-10-01 HISTORY — DX: Acute embolism and thrombosis of unspecified deep veins of unspecified lower extremity: I82.409

## 2019-10-01 HISTORY — DX: Gastro-esophageal reflux disease without esophagitis: K21.9

## 2019-10-01 HISTORY — DX: Personal history of other infectious and parasitic diseases: Z86.19

## 2019-10-01 HISTORY — DX: Carpal tunnel syndrome, unspecified upper limb: G56.00

## 2019-10-01 HISTORY — DX: Pneumonia, unspecified organism: J18.9

## 2019-10-01 HISTORY — DX: Chronic obstructive pulmonary disease, unspecified: J44.9

## 2019-10-01 HISTORY — DX: Type 2 diabetes mellitus without complications: E11.9

## 2019-10-01 HISTORY — DX: Personal history of other diseases of the circulatory system: Z86.79

## 2019-10-01 HISTORY — DX: Unspecified osteoarthritis, unspecified site: M19.90

## 2019-10-01 HISTORY — DX: Phimosis: N47.1

## 2019-10-01 HISTORY — DX: Anemia, unspecified: D64.9

## 2019-10-01 LAB — BASIC METABOLIC PANEL
Anion gap: 9 (ref 5–15)
BUN: 22 mg/dL (ref 8–23)
CO2: 31 mmol/L (ref 22–32)
Calcium: 9.3 mg/dL (ref 8.9–10.3)
Chloride: 99 mmol/L (ref 98–111)
Creatinine, Ser: 1.1 mg/dL (ref 0.61–1.24)
GFR calc Af Amer: >60 mL/min (ref >60)
GFR calc non Af Amer: >60 mL/min (ref >60)
Glucose, Bld: 168 mg/dL — ABNORMAL HIGH (ref 70–99)
Potassium: 4.6 mmol/L (ref 3.5–5.1)
Sodium: 139 mmol/L (ref 135–145)

## 2019-10-01 LAB — CBC
HCT: 42.2 % (ref 39.0–52.0)
Hemoglobin: 13.2 g/dL (ref 13.0–17.0)
MCH: 31 pg (ref 26.0–34.0)
MCHC: 31.3 g/dL (ref 30.0–36.0)
MCV: 99.1 fL (ref 80.0–100.0)
Platelets: 207 10*3/uL (ref 150–400)
RBC: 4.26 MIL/uL (ref 4.22–5.81)
RDW: 15.3 % (ref 11.5–15.5)
WBC: 6.2 10*3/uL (ref 4.0–10.5)
nRBC: 0 % (ref 0.0–0.2)

## 2019-10-01 LAB — HEMOGLOBIN A1C
Hgb A1c MFr Bld: 7.6 % — ABNORMAL HIGH (ref 4.8–5.6)
Mean Plasma Glucose: 171.42 mg/dL

## 2019-10-01 LAB — GLUCOSE, CAPILLARY: Glucose-Capillary: 184 mg/dL — ABNORMAL HIGH (ref 70–99)

## 2019-10-01 NOTE — Progress Notes (Addendum)
PCP - Dr. Andria Frames Cardiologist - Dr. Alma Friendly Last office visit 09/14/2019 in epic cardiac clearance note 10/01/2019 in epic. Pulmonologist:  Dr. Selinda Orion Last office visit 02/06/2019  Chest x-ray - 12/25/2018 in epic EKG - 09/12/2019 in epic Stress Test - greater than 2 years ECHO - 12/26/2018 in epic Cardiac Cath - greater than 2 years  Sleep Study - 03/08/2016 in epic CPAP - uses oxygen at night time  Fasting Blood Sugar - 92-110 Checks Blood Sugar __3___ times a day  Blood Thinner Instructions: N/A Aspirin Instructions: Yes Last Dose:  Last dose 09/29/2019   Anesthesia review: COPD, HTN, DM, CAD, MI, CABG, Afib, OSA uses oxygen at night time, Moderate Pulm. HTN  Patient denies shortness of breath, fever, cough and chest pain at PAT appointment   Patient verbalized understanding of instructions that were given to them at the PAT appointment. Patient was also instructed that they will need to review over the PAT instructions again at home before surgery.

## 2019-10-01 NOTE — Progress Notes (Signed)
SPOKE W/ Jeremiah [name]     SCREENING SYMPTOMS OF COVID 19:   COUGH--NO  RUNNY NOSE--- NO  SORE THROAT---NO  NASAL CONGESTION----NO  SNEEZING----NO  SHORTNESS OF BREATH---NO  DIFFICULTY BREATHING---NO  TEMP >100.0 -----NO  UNEXPLAINED BODY ACHES------NO  CHILLS -------- NO  HEADACHES ---------NO  LOSS OF SMELL/ TASTE --------NO    HAVE YOU OR ANY FAMILY MEMBER TRAVELLED PAST 14 DAYS OUT OF THE   COUNTY---NO STATE----NO COUNTRY----NO  HAVE YOU OR ANY FAMILY MEMBER BEEN EXPOSED TO ANYONE WITH COVID 19? NO

## 2019-10-01 NOTE — Telephone Encounter (Signed)
   Primary Cardiologist: Pixie Casino, MD  Chart reviewed as part of pre-operative protocol coverage. Patient was contacted 10/01/2019 in reference to pre-operative risk assessment for pending surgery as outlined below.  Song Gennusa was last seen on 09/14/2019 by Dr. Debara Pickett.  Since that day, Shawne Bresler has done well without chest pain. He uses oxygen at night along with CPAP and also during exertion. He denies any dyspnea at rest.   Therefore, based on ACC/AHA guidelines, the patient would be at acceptable risk for the planned procedure without further cardiovascular testing.   I will route this recommendation to the requesting party via Epic fax function and remove from pre-op pool.  Please call with questions.  Per patient, urology told him to hold aspirin for 7 days prior to the surgery and he has been holding aspirin since 09/29/2019. He will need to restart aspirin as soon as possible after the procedure.   [place], [place] 10/01/2019, 3:22 PM

## 2019-10-03 ENCOUNTER — Other Ambulatory Visit (HOSPITAL_COMMUNITY)
Admission: RE | Admit: 2019-10-03 | Discharge: 2019-10-03 | Disposition: A | Discharge status: Home / Self Care | Payer: Federal, State, Local not specified - PPO | Source: Ambulatory Visit | Attending: Urology | Admitting: Urology

## 2019-10-03 DIAGNOSIS — Z01812 Encounter for preprocedural laboratory examination: Secondary | ICD-10-CM | POA: Insufficient documentation

## 2019-10-03 DIAGNOSIS — Z20828 Contact with and (suspected) exposure to other viral communicable diseases: Secondary | ICD-10-CM | POA: Insufficient documentation

## 2019-10-04 LAB — NOVEL CORONAVIRUS, NAA (HOSP ORDER, SEND-OUT TO REF LAB; TAT 18-24 HRS): SARS-CoV-2, NAA: NOT DETECTED

## 2019-10-05 NOTE — Progress Notes (Signed)
Anesthesia Chart Review   Case: [id number] Date/Time: 10/07/19 0941   Procedure: CIRCUMCISION ADULT (N/A )   Anesthesia type: Monitor Anesthesia Care   Pre-op diagnosis: PHIMOSIS   Location: [facility] ROOM 04 / [facility]   Surgeon: Ardis Hughs, MD      DISCUSSION:73 y.o. former smoker (30 pack years, quit 12/15/16) with h/o HLD, HTN, hypothyroidism, CAD (three-vessel CABG 2006), PSVT, DM II, COPD, OSA with home O2 with CPAP at bedtime, GERD, prostate cancer 2008, phimosis scheduled for above procedure 10/07/2019 with Dr. Louis Meckel.   Cleared by cardiology 10/01/2019.  Per Almyra Deforest, PA-C, "Chart reviewed as part of pre-operative protocol coverage. Patient was contacted 10/01/2019 in reference to pre-operative risk assessment for pending surgery as outlined below.  Zebedee Segundo was last seen on 09/14/2019 by Dr. Debara Pickett.  Since that day, Orlanda Lemmerman has done well without chest pain. He uses oxygen at night along with CPAP and also during exertion. He denies any dyspnea at rest. Therefore, based on ACC/AHA guidelines, the patient would be at acceptable risk for the planned procedure without further cardiovascular testing"  Last seen by pulmonologist, Dr. Christinia Gully, 02/06/2019.  Stable at this visit.   Anticipate pt can proceed with planned procedure barring acute status change.   VS: BP (!) 155/71 (BP Location: Left Arm)   Pulse 73   Temp 36.8 C (Oral)   Resp 18   Ht _0  (1.727 m)   Wt 112.2 kg   SpO2 95%   BMI 37.59 kg/m   PROVIDERS: Koirala, Dibas, MD is PCP   Lyman Bishop, MD is Cardiologist last seen 09/14/2019  Christinia Gully, MD is Pulmonologist  LABS: Labs reviewed: Acceptable for surgery. (all labs ordered are listed, but only abnormal results are displayed)  Labs Reviewed  BASIC METABOLIC PANEL - Abnormal; Notable for the following components:      Result Value   Glucose, Bld 168 (*)    All other components within normal limits  HEMOGLOBIN A1C - Abnormal; Notable  for the following components:   Hgb A1c MFr Bld 7.6 (*)    All other components within normal limits  GLUCOSE, CAPILLARY - Abnormal; Notable for the following components:   Glucose-Capillary 184 (*)    All other components within normal limits  CBC     IMAGES: CT Angio Chest 01/01/2019 IMPRESSION: 1. No CT findings for pulmonary embolism. 2. Advanced atherosclerotic calcifications involving the aorta and coronary arteries but no aortic aneurysm or dissection. 3. Scattered mediastinal and hilar lymph nodes but no mass or overt adenopathy. 4. Mild cardiac enlargement, vascular congestion, mild interstitial edema and bilateral pleural effusions suggesting CHF. 5. Bibasilar atelectasis but no infiltrates.  VAS US Carotid 09/10/2018 Summary: Right Carotid: Velocities in the right ICA are consistent with a 1-39% stenosis.                Non-hemodynamically significant plaque <50% noted in the CCA.  Left Carotid: Velocities in the left ICA are consistent with a 1-39% stenosis.               Non-hemodynamically significant plaque noted in the CCA.  Vertebrals:  Bilateral vertebral arteries demonstrate antegrade flow. Subclavians: Normal flow hemodynamics were seen in bilateral subclavian              arteries.  EKG: 09/12/2019 Rate 65 bpm Normal sinus rhythm  T wave abnormality, consider inferolateral ischemia   CV: Echo 12/26/2018 Study Conclusions  - Left ventricle: The cavity size  was normal. Wall thickness was   increased in a pattern of mild LVH. There was mild focal basal   hypertrophy of the septum. Systolic function was normal. The   estimated ejection fraction was in the range of 60% to 65%. Wall   motion was normal; there were no regional wall motion   abnormalities. Doppler parameters are consistent with high   ventricular filling pressure. - Aortic valve: Noncoronary cusp mobility was mildly restricted. - Mitral valve: Severely calcified annulus. The findings  are   consistent with mild stenosis. Valve area by pressure half-time:   2.09 cm^2. - Left atrium: The atrium was severely dilated. - Pulmonary arteries: Systolic pressure was moderately increased.   PA peak pressure: 49 mm Hg (S).  Impressions:  - Definity used; normal LV systolic function; mild LVH; elevated LV   filling pressure; sclerotic aortic valve; severe MAC with mild MS   (mean gradient 5 mmHg); severe LAE; trace TR with moderate   pulmonary hypertension.  Cardiac Cath 05/08/2016 1. LM lesion, 85% stenosed. 2. Ost 1st Mrg lesion, 100% stenosed. 3. SVG was injected . 4. Ost RCA to Prox RCA lesion, 100% stenosed. 5. SVG . 6. Origin to Prox Graft lesion, 40% stenosed. 7. Mid RCA to Dist RCA lesion, 100% stenosed. 8. Prox LAD lesion, 100% stenosed. 9. LIMA .    Patent saphenous vein grafts. SVG to PDA contains eccentric 40% proximal stenosis. SVG to the circumflex is widely patent.  LIMA to the LAD is widely patent.  Native distal left main contains 75% stenosis. The dominant obtuse marginal branch is totally occluded. The native right coronary is totally occluded.  Left ventriculogram demonstrates inferior wall hypokinesis. EF 50%.  Recent chest pain in the setting of PSVT with minimal enzyme elevation likely related to demand ischemia in the setting of underlying native coronary disease.  Recommendations:  Medical therapy.  Management of PSVT with medications versus ablation. Past Medical History:  Diagnosis Date  . A-fib ([place])   . Anemia   . Aortic valve sclerosis 12/2018   Noted on ECHO  . Arthritis   . CAD (coronary artery disease)    a. 2006: CABG in 2006 with LIMA-LAD, SVG-OM1, and SVG-RPDA  . Cardiomegaly 12/2018   Stable, noted on CXR  . Carpal tunnel syndrome    Right  . Chronic pain 03/21/2016  . Colon cancer ([place]) 2006  . COPD (chronic obstructive pulmonary disease) ([place])   . Diabetes mellitus without complication ([place])   . DVT (deep venous  thrombosis) ([facility])    Right  . Essential hypertension 03/21/2016  . GERD (gastroesophageal reflux disease)   . History of blood transfusion   . History of Clostridioides difficile infection   . History of prostate cancer 03/21/2016  . History of PSVT (paroxysmal supraventricular tachycardia)   . HLD (hyperlipidemia)   . HTN (hypertension)   . Hx of CABG 2006  . Hypercholesteremia 03/21/2016  . Hypothyroidism   . LAE (left atrial enlargement) 12/2018   Severe, Noted on ECHO  . LVH (left ventricular hypertrophy) 12/2018   Mild, Noted on ECHO  . Medication management 03/21/2016  . Mitral annular calcification 12/2018   with mild MS, Noted on ECHO  . Morbid obesity ([place]) 03/21/2016  . Myocardial infarct ([place])   . OSA (obstructive sleep apnea) 03/21/2016   uses oxygen at night time  . Pain in right ankle and joints of right foot 03/21/2016  . Paronychia 03/21/2016  . Phimosis   . Pneumonia   .  Primary insomnia 03/21/2016  . Prostate cancer ([place]) 2008  . Pulmonary hypertension ([place]) 12/2018   Moderate, Noted on ECHO  . Tobacco dependence 03/21/2016  . Tricuspid regurgitation 12/2018   Mild, Noted on ECHO    Past Surgical History:  Procedure Laterality Date  . ANKLE SURGERY Right 12/2013  . CARDIAC CATHETERIZATION N/A 05/08/2016   Procedure: Left Heart Cath and Cors/Grafts Angiography;  Surgeon: Belva Crome, MD;  Location: [facility];  Service: Cardiovascular;  Laterality: N/A;  . CORONARY ARTERY BYPASS GRAFT  2006   x3  . ELECTROPHYSIOLOGIC STUDY N/A 08/24/2016   Procedure: SVT Ablation;  Surgeon: Will Meredith Leeds, MD;  Location: [facility];  Service: Cardiovascular;  Laterality: N/A;  . PROSTATE SURGERY  2008  . PTCA      MEDICATIONS: . albuterol (PROVENTIL) (2.5 MG/3ML) 0.083% nebulizer solution  . aspirin EC 81 MG tablet  . atorvastatin (LIPITOR) 80 MG tablet  . betamethasone dipropionate 0.05 % lotion  . bisoprolol (ZEBETA) 5 MG tablet  . ezetimibe (ZETIA)  10 MG tablet  . ferrous sulfate (FEROSUL) 325 (65 FE) MG tablet  . formoterol (PERFOROMIST) 20 MCG/2ML nebulizer solution  . furosemide (LASIX) 40 MG tablet  . glimepiride (AMARYL) 4 MG tablet  . levothyroxine (SYNTHROID, LEVOTHROID) 88 MCG tablet  . metFORMIN (GLUCOPHAGE-XR) 500 MG 24 hr tablet  . OXYGEN  . pantoprazole (PROTONIX) 40 MG tablet  . potassium chloride (KLOR-CON) 20 MEQ packet  . Revefenacin (YUPELRI) 175 MCG/3ML SOLN  . traZODone (DESYREL) 50 MG tablet   No current facility-administered medications for this encounter.      Maia Plan [facility] Pre-Surgical Testing [phone] 10/05/19  12:38 PM

## 2019-10-07 ENCOUNTER — Ambulatory Visit (HOSPITAL_COMMUNITY)
Admission: RE | Admit: 2019-10-07 | Discharge: 2019-10-07 | Disposition: A | Discharge status: Home / Self Care | Payer: Federal, State, Local not specified - PPO | Source: Other Acute Inpatient Hospital | Attending: Urology | Admitting: Urology

## 2019-10-07 ENCOUNTER — Encounter (HOSPITAL_COMMUNITY)
Admission: RE | Disposition: A | Discharge status: Home / Self Care | Payer: Self-pay | Source: Other Acute Inpatient Hospital | Attending: Urology

## 2019-10-07 ENCOUNTER — Other Ambulatory Visit: Payer: Self-pay

## 2019-10-07 ENCOUNTER — Encounter (HOSPITAL_COMMUNITY): Payer: Self-pay

## 2019-10-07 ENCOUNTER — Ambulatory Visit (HOSPITAL_COMMUNITY): Payer: Federal, State, Local not specified - PPO | Admitting: Anesthesiology

## 2019-10-07 ENCOUNTER — Ambulatory Visit (HOSPITAL_COMMUNITY): Payer: Federal, State, Local not specified - PPO | Admitting: Physician Assistant

## 2019-10-07 DIAGNOSIS — I2510 Atherosclerotic heart disease of native coronary artery without angina pectoris: Secondary | ICD-10-CM | POA: Diagnosis not present

## 2019-10-07 DIAGNOSIS — Z79899 Other long term (current) drug therapy: Secondary | ICD-10-CM | POA: Insufficient documentation

## 2019-10-07 DIAGNOSIS — E119 Type 2 diabetes mellitus without complications: Secondary | ICD-10-CM | POA: Diagnosis not present

## 2019-10-07 DIAGNOSIS — J449 Chronic obstructive pulmonary disease, unspecified: Secondary | ICD-10-CM | POA: Diagnosis not present

## 2019-10-07 DIAGNOSIS — Z86718 Personal history of other venous thrombosis and embolism: Secondary | ICD-10-CM | POA: Diagnosis not present

## 2019-10-07 DIAGNOSIS — Z79890 Hormone replacement therapy (postmenopausal): Secondary | ICD-10-CM | POA: Insufficient documentation

## 2019-10-07 DIAGNOSIS — E785 Hyperlipidemia, unspecified: Secondary | ICD-10-CM | POA: Insufficient documentation

## 2019-10-07 DIAGNOSIS — Z951 Presence of aortocoronary bypass graft: Secondary | ICD-10-CM | POA: Insufficient documentation

## 2019-10-07 DIAGNOSIS — I252 Old myocardial infarction: Secondary | ICD-10-CM | POA: Diagnosis not present

## 2019-10-07 DIAGNOSIS — Z8546 Personal history of malignant neoplasm of prostate: Secondary | ICD-10-CM | POA: Diagnosis not present

## 2019-10-07 DIAGNOSIS — I5033 Acute on chronic diastolic (congestive) heart failure: Secondary | ICD-10-CM | POA: Diagnosis not present

## 2019-10-07 DIAGNOSIS — K219 Gastro-esophageal reflux disease without esophagitis: Secondary | ICD-10-CM | POA: Insufficient documentation

## 2019-10-07 DIAGNOSIS — Z7951 Long term (current) use of inhaled steroids: Secondary | ICD-10-CM | POA: Insufficient documentation

## 2019-10-07 DIAGNOSIS — I509 Heart failure, unspecified: Secondary | ICD-10-CM | POA: Insufficient documentation

## 2019-10-07 DIAGNOSIS — G4730 Sleep apnea, unspecified: Secondary | ICD-10-CM | POA: Diagnosis not present

## 2019-10-07 DIAGNOSIS — F17200 Nicotine dependence, unspecified, uncomplicated: Secondary | ICD-10-CM | POA: Diagnosis not present

## 2019-10-07 DIAGNOSIS — E039 Hypothyroidism, unspecified: Secondary | ICD-10-CM | POA: Insufficient documentation

## 2019-10-07 DIAGNOSIS — N471 Phimosis: Secondary | ICD-10-CM | POA: Insufficient documentation

## 2019-10-07 DIAGNOSIS — Z9981 Dependence on supplemental oxygen: Secondary | ICD-10-CM | POA: Insufficient documentation

## 2019-10-07 DIAGNOSIS — Z7984 Long term (current) use of oral hypoglycemic drugs: Secondary | ICD-10-CM | POA: Diagnosis not present

## 2019-10-07 DIAGNOSIS — Z7982 Long term (current) use of aspirin: Secondary | ICD-10-CM | POA: Insufficient documentation

## 2019-10-07 DIAGNOSIS — I110 Hypertensive heart disease with heart failure: Secondary | ICD-10-CM | POA: Insufficient documentation

## 2019-10-07 DIAGNOSIS — Z7952 Long term (current) use of systemic steroids: Secondary | ICD-10-CM | POA: Insufficient documentation

## 2019-10-07 HISTORY — PX: CIRCUMCISION: SHX1350

## 2019-10-07 LAB — GLUCOSE, CAPILLARY
Glucose-Capillary: 121 mg/dL — ABNORMAL HIGH (ref 70–99)
Glucose-Capillary: 137 mg/dL — ABNORMAL HIGH (ref 70–99)

## 2019-10-07 SURGERY — CIRCUMCISION, ADULT
Anesthesia: Monitor Anesthesia Care

## 2019-10-07 MED ORDER — CEFAZOLIN SODIUM-DEXTROSE 2-4 GM/100ML-% IV SOLN
2.0000 g | INTRAVENOUS | Status: AC
  Administered 2019-10-07: 2 g via INTRAVENOUS
  Filled 2019-10-07: qty 100

## 2019-10-07 MED ORDER — LIDOCAINE 2% (20 MG/ML) 5 ML SYRINGE
INTRAMUSCULAR | Status: DC | PRN
  Administered 2019-10-07: 60 mg via INTRAVENOUS

## 2019-10-07 MED ORDER — KETAMINE HCL 10 MG/ML IJ SOLN
INTRAMUSCULAR | Status: DC | PRN
  Administered 2019-10-07: 20 mg via INTRAVENOUS
  Administered 2019-10-07: 10 mg via INTRAVENOUS

## 2019-10-07 MED ORDER — KETAMINE HCL 10 MG/ML IJ SOLN
INTRAMUSCULAR | Status: AC
  Filled 2019-10-07: qty 1

## 2019-10-07 MED ORDER — BACITRACIN-NEOMYCIN-POLYMYXIN OINTMENT TUBE
TOPICAL_OINTMENT | CUTANEOUS | Status: AC
  Filled 2019-10-07: qty 14.17

## 2019-10-07 MED ORDER — ONDANSETRON HCL 4 MG/2ML IJ SOLN
INTRAMUSCULAR | Status: AC
  Filled 2019-10-07: qty 2

## 2019-10-07 MED ORDER — FENTANYL CITRATE (PF) 100 MCG/2ML IJ SOLN
INTRAMUSCULAR | Status: DC | PRN
  Administered 2019-10-07 (×2): 25 ug via INTRAVENOUS

## 2019-10-07 MED ORDER — BACITRACIN ZINC 500 UNIT/GM EX OINT
TOPICAL_OINTMENT | CUTANEOUS | Status: DC | PRN
  Administered 2019-10-07: 1 via TOPICAL

## 2019-10-07 MED ORDER — ACETAMINOPHEN 500 MG PO TABS
1000.0000 mg | ORAL_TABLET | Freq: Once | ORAL | Status: AC
  Administered 2019-10-07: 1000 mg via ORAL
  Filled 2019-10-07: qty 2

## 2019-10-07 MED ORDER — LACTATED RINGERS IV SOLN
INTRAVENOUS | Status: DC
  Administered 2019-10-07: 09:00:00 via INTRAVENOUS

## 2019-10-07 MED ORDER — DEXAMETHASONE SODIUM PHOSPHATE 10 MG/ML IJ SOLN
INTRAMUSCULAR | Status: AC
  Filled 2019-10-07: qty 1

## 2019-10-07 MED ORDER — DEXAMETHASONE SODIUM PHOSPHATE 10 MG/ML IJ SOLN
INTRAMUSCULAR | Status: DC | PRN
  Administered 2019-10-07: 10 mg via INTRAVENOUS

## 2019-10-07 MED ORDER — MIDAZOLAM HCL 5 MG/5ML IJ SOLN
INTRAMUSCULAR | Status: DC | PRN
  Administered 2019-10-07: 2 mg via INTRAVENOUS

## 2019-10-07 MED ORDER — PROPOFOL 500 MG/50ML IV EMUL
INTRAVENOUS | Status: DC | PRN
  Administered 2019-10-07: 50 ug/kg/min via INTRAVENOUS

## 2019-10-07 MED ORDER — ACETAMINOPHEN-CODEINE #2 300-15 MG PO TABS: 1.0000 | ORAL_TABLET | ORAL | 0 refills | Status: DC | PRN

## 2019-10-07 MED ORDER — FENTANYL CITRATE (PF) 100 MCG/2ML IJ SOLN
INTRAMUSCULAR | Status: AC
  Filled 2019-10-07: qty 2

## 2019-10-07 MED ORDER — LIDOCAINE HCL (PF) 1 % IJ SOLN
INTRAMUSCULAR | Status: DC | PRN
  Administered 2019-10-07: 30 mL

## 2019-10-07 MED ORDER — PROPOFOL 10 MG/ML IV BOLUS
INTRAVENOUS | Status: DC | PRN
  Administered 2019-10-07: 20 mg via INTRAVENOUS

## 2019-10-07 MED ORDER — PHENYLEPHRINE 40 MCG/ML (10ML) SYRINGE FOR IV PUSH (FOR BLOOD PRESSURE SUPPORT)
PREFILLED_SYRINGE | INTRAVENOUS | Status: DC | PRN
  Administered 2019-10-07 (×2): 80 ug via INTRAVENOUS

## 2019-10-07 MED ORDER — MIDAZOLAM HCL 2 MG/2ML IJ SOLN
INTRAMUSCULAR | Status: AC
  Filled 2019-10-07: qty 2

## 2019-10-07 MED ORDER — PROPOFOL 500 MG/50ML IV EMUL
INTRAVENOUS | Status: AC
  Filled 2019-10-07: qty 50

## 2019-10-07 MED ORDER — FENTANYL CITRATE (PF) 100 MCG/2ML IJ SOLN: 25.0000 ug | INTRAMUSCULAR | Status: DC | PRN

## 2019-10-07 MED ORDER — LIDOCAINE HCL (PF) 1 % IJ SOLN
INTRAMUSCULAR | Status: AC
  Filled 2019-10-07: qty 30

## 2019-10-07 MED ORDER — ONDANSETRON HCL 4 MG/2ML IJ SOLN
INTRAMUSCULAR | Status: DC | PRN
  Administered 2019-10-07: 4 mg via INTRAVENOUS

## 2019-10-07 MED ORDER — PROPOFOL 10 MG/ML IV BOLUS
INTRAVENOUS | Status: AC
  Filled 2019-10-07: qty 20

## 2019-10-07 MED ORDER — LIDOCAINE 2% (20 MG/ML) 5 ML SYRINGE
INTRAMUSCULAR | Status: AC
  Filled 2019-10-07: qty 5

## 2019-10-07 SURGICAL SUPPLY — 31 items
BLADE SURG 15 STRL LF DISP TIS (BLADE) ×1 IMPLANT
BLADE SURG 15 STRL SS (BLADE) ×1
BNDG COHESIVE 1X5 TAN STRL LF (GAUZE/BANDAGES/DRESSINGS) ×2 IMPLANT
BNDG CONFORM 2 STRL LF (GAUZE/BANDAGES/DRESSINGS) ×2 IMPLANT
COVER MAYO STAND STRL (DRAPES) ×1 IMPLANT
COVER SURGICAL LIGHT HANDLE (MISCELLANEOUS) ×2 IMPLANT
COVER WAND RF STERILE (DRAPES) IMPLANT
DRAPE LAPAROTOMY T 98X78 PEDS (DRAPES) ×2 IMPLANT
ELECT PENCIL ROCKER SW 15FT (MISCELLANEOUS) ×2 IMPLANT
ELECT REM PT RETURN 15FT ADLT (MISCELLANEOUS) ×2 IMPLANT
GAUZE 4X4 16PLY RFD (DISPOSABLE) ×2 IMPLANT
GAUZE PETROLATUM 1 X8 (GAUZE/BANDAGES/DRESSINGS) ×1 IMPLANT
GLOVE BIOGEL M STRL SZ7.5 (GLOVE) ×2 IMPLANT
GOWN STRL REUS W/TWL XL LVL3 (GOWN DISPOSABLE) ×2 IMPLANT
KIT BASIN OR (CUSTOM PROCEDURE TRAY) ×2 IMPLANT
KIT TURNOVER KIT A (KITS) ×1 IMPLANT
NEEDLE HYPO 22GX1.5 SAFETY (NEEDLE) ×2 IMPLANT
NS IRRIG 1000ML POUR BTL (IV SOLUTION) IMPLANT
PACK BASIC VI WITH GOWN DISP (CUSTOM PROCEDURE TRAY) ×2 IMPLANT
SOL PREP PROV IODINE SCRUB 4OZ (MISCELLANEOUS) ×2 IMPLANT
SUT CHROMIC 3 0 SH 27 (SUTURE) IMPLANT
SUT CHROMIC 4 0 SH 27 (SUTURE) IMPLANT
SUT VIC AB 3-0 SH 18 (SUTURE) IMPLANT
SUT VIC AB 3-0 SH 27 (SUTURE) ×1
SUT VIC AB 3-0 SH 27X BRD (SUTURE) IMPLANT
SUT VIC AB 4-0 PS2 27 (SUTURE) ×3 IMPLANT
SUT VIC AB 4-0 SH 18 (SUTURE) IMPLANT
SYR CONTROL 10ML LL (SYRINGE) ×1 IMPLANT
TOWEL OR 17X26 10 PK STRL BLUE (TOWEL DISPOSABLE) ×2 IMPLANT
TOWEL OR NON WOVEN STRL DISP B (DISPOSABLE) ×2 IMPLANT
WATER STERILE IRR 1000ML POUR (IV SOLUTION) IMPLANT

## 2019-10-07 NOTE — Anesthesia Postprocedure Evaluation (Signed)
Anesthesia Post Note  Patient: Jeremiah [name]  Procedure(s) Performed: CIRCUMCISION ADULT (N/A )     Patient location during evaluation: PACU Anesthesia Type: MAC Level of consciousness: awake and alert Pain management: pain level controlled Vital Signs Assessment: post-procedure vital signs reviewed and stable Respiratory status: spontaneous breathing, nonlabored ventilation, respiratory function stable and patient connected to nasal cannula oxygen Cardiovascular status: stable and blood pressure returned to baseline Postop Assessment: no apparent nausea or vomiting Anesthetic complications: no    Last Vitals:  Vitals:   10/07/19 1205 10/07/19 1215  BP: 125/78   Pulse: 66   Resp:    Temp:    SpO2: (!) 89% 91%    Last Pain:  Vitals:   10/07/19 1205  TempSrc:   PainSc: 0-No pain                 Dmitriy Gair P Alya Smaltz

## 2019-10-07 NOTE — Op Note (Signed)
Preoperative diagnosis:  1. phimosis   Postoperative diagnosis:  1. Same   Procedure: 1. Circumcision  Surgeon: Ardis Hughs, MD  Anesthesia: General  Complications: None  Intraoperative findings: tight phimotic ring  EBL: Minimal  Specimens: None  Indication: Jeremiah [name] is a 73 y.o. patient with phimosis and unable to retract his foreskin causing recurrent infections and/or poor hygiene.  After reviewing the management options for treatment, he elected to proceed with the above surgical procedure(s). We have discussed the potential benefits and risks of the procedure, side effects of the proposed treatment, the likelihood of the patient achieving the goals of the procedure, and any potential problems that might occur during the procedure or recuperation. Informed consent has been obtained.  Procedure:  After general anesthesia was induced the patient was prepped and draped in the routine sterile fashion. A penile block was then performed with local anesthetic. The foreskin was then opened enough to allow retraction over the glans penis. The prepuce was then marked so as to provide a proximately 5 mm collar.  The foreskin was then reduced over the glans penis and marked so as to allow a nice fit for the reanastomosis of the skin to the collar.  A 15 blade was then used to make a circumferential incision through the dermis of both marks. A pair of Metzenbaum scissors was then tunneled through the incisions and the foreskin opened on top of the scissors using electrocautery. The foreskin was then removed circumferentially. The underlying tissue was then cauterized and all bleeding stopped.  The penile skin was then approximated to the collar in 4 quadrants using a 3-0 Vicryl. The frenulum was attached with a U stitch in the dorsal surface. The remaining skin was then closed in a running baseball stitch in 4 separate sutures using 4-0 Vicryl.  Vaseline impregnated dressing was  then applied to the suture line in the penis was wrapped with 2 inch Kerlix dressing gently. The patient was then given 12 dressing and a pair of mesh underpants. The patient was subsequently awoken and returned to PACU in excellent condition. There no immediate complications.

## 2019-10-07 NOTE — Transfer of Care (Signed)
Immediate Anesthesia Transfer of Care Note  Patient: Jeremiah [name]  Procedure(s) Performed: CIRCUMCISION ADULT (N/A )  Patient Location: PACU  Anesthesia Type:MAC  Level of Consciousness: awake and oriented  Airway & Oxygen Therapy: Patient Spontanous Breathing and Patient connected to face mask oxygen  Post-op Assessment: Report given to RN and Post -op Vital signs reviewed and stable  Post vital signs: Reviewed and stable  Last Vitals:  Vitals Value Taken Time  BP 119/63 10/07/19 1128  Temp 36.4 C 10/07/19 1128  Pulse 60 10/07/19 1129  Resp 16 10/07/19 1129  SpO2 96 % 10/07/19 1129  Vitals shown include unvalidated device data.  Last Pain:  Vitals:   10/07/19 0830  TempSrc: Oral  PainSc:          Complications: No apparent anesthesia complications

## 2019-10-07 NOTE — Anesthesia Preprocedure Evaluation (Addendum)
Anesthesia Evaluation  Patient identified by MRN, date of birth, ID band Patient awake    Reviewed: Allergy & Precautions, NPO status , Patient's Chart, lab work & pertinent test results  Airway Mallampati: IV  TM Distance: >3 FB Neck ROM: Full    Dental no notable dental hx.    Pulmonary sleep apnea , COPD,  COPD inhaler and oxygen dependent, former smoker,    Pulmonary exam normal breath sounds clear to auscultation       Cardiovascular hypertension, + CAD, + Past MI, + CABG, +CHF and + DVT  Normal cardiovascular exam Rhythm:Regular Rate:Normal  ECG: NSR, rate 65  Pulmonary hypertension   ECHO: Definity used; normal LV systolic function; mild LVH; elevated LV filling pressure; sclerotic aortic valve; severe MAC with mild MS (mean gradient 5 mmHg); severe LAE; trace TR with moderate pulmonary hypertension.   Neuro/Psych negative neurological ROS  negative psych ROS   GI/Hepatic Neg liver ROS, GERD  Medicated and Controlled,  Endo/Other  diabetes, Oral Hypoglycemic AgentsHypothyroidism   Renal/GU negative Renal ROS     Musculoskeletal   Abdominal (+) + obese,   Peds  Hematology HLD   Anesthesia Other Findings PHIMOSIS  Reproductive/Obstetrics                           Anesthesia Physical Anesthesia Plan  ASA: IV  Anesthesia Plan: MAC   Post-op Pain Management:    Induction: Intravenous  PONV Risk Score and Plan: 1 and Ondansetron, Dexamethasone, Treatment may vary due to age or medical condition and Propofol infusion  Airway Management Planned: Simple Face Mask  Additional Equipment:   Intra-op Plan:   Post-operative Plan:   Informed Consent: I have reviewed the patients History and Physical, chart, labs and discussed the procedure including the risks, benefits and alternatives for the proposed anesthesia with the patient or authorized representative who has indicated his/her  understanding and acceptance.     Dental advisory given  Plan Discussed with: CRNA  Anesthesia Plan Comments:        Anesthesia Quick Evaluation

## 2019-10-07 NOTE — Discharge Instructions (Signed)
Postoperative instructions for circumcision ° °Wound: ° °In most cases your incision will have absorbable sutures that run along the course of your incision and will dissolve within the first 10-20 days. Some will fall out even earlier. Expect some redness as the sutures dissolved but this should occur only around the sutures. If there is generalized redness, especially with increasing pain or swelling, let us know. The penis will very likely get "black and blue" as the blood in the tissues spread. Sometimes the whole penis will turn colors. The black and blue is followed by a yellow and brown color. In time, all the discoloration will go away. ° °Diet: ° °You may return to your normal diet within 24 hours following your surgery. You may note some mild nausea and possibly vomiting the first 6-8 hours following surgery. This is usually due to the side effects of anesthesia, and will disappear quite soon. I would suggest clear liquids and a very light meal the first evening following your surgery. ° °Activity: ° °Your physical activity should be restricted the first 48 hours. During that time you should remain relatively inactive, moving about only when necessary. During the first 7-10 days following surgery he should avoid lifting any heavy objects (anything greater than 15 pounds), and avoid strenuous exercise. If you work, ask us specifically about your restrictions, both for work and home. We will write a note to your employer if needed. ° °Ice packs can be placed on and off over the penis for the first 48 hours to help relieve the pain and keep the swelling down. Frozen peas or corn in a ZipLock bag can be frozen, used and re-frozen. Fifteen minutes on and 15 minutes off is a reasonable schedule.  ° °Hygiene: ° °You may shower 48 hours after your surgery. Tub bathing should be restricted until the seventh day. ° °Medication: ° °You will be sent home with some type of pain medication. In many cases you will be  sent home with a narcotic pain pill (Vicodin or Tylox). If the pain is not too bad, you may take either Tylenol (acetaminophen) or Advil (ibuprofen) which contain no narcotic agents, and might be tolerated a little better, with fewer side effects. If the pain medication you are sent home with does not control the pain, you will have to let us know. Some narcotic pain medications cannot be given or refilled by a phone call to a pharmacy. ° °Problems you should report to us: ° °· Fever of 101.0 degrees Fahrenheit or greater. °· Moderate or severe swelling under the skin incision or involving the scrotum. °· Drug reaction such as hives, a rash, nausea or vomiting.  ° ° °

## 2019-10-07 NOTE — Interval H&P Note (Signed)
History and Physical Interval Note:  10/07/2019 9:03 AM  Jeremiah [name]  has presented today for surgery, with the diagnosis of PHIMOSIS.  The various methods of treatment have been discussed with the patient and family. After consideration of risks, benefits and other options for treatment, the patient has consented to  Procedure(s): CIRCUMCISION ADULT (N/A) as a surgical intervention.  The patient's history has been reviewed, patient examined, no change in status, stable for surgery.  I have reviewed the patient's chart and labs.  Questions were answered to the patient's satisfaction.     Ardis Hughs

## 2019-10-07 NOTE — H&P (Signed)
Seen for penis problems  HPI: Jeremiah [name] is a 73 year-old male established patient who is here for further eval and management of his penile issues.  He first noticed the symptoms approximately 09/02/2018.   He does have difficulty retracting his foreskin. The patient denies an ulceration/rash on his penis. He has been treated with creams, ointments, or other medications.   He does have difficulties achieving an erection. He does not have a history of urinary infections. He does not have dysuria.   He denies any pain/swelling in his inguinal region. He does not haveswelling. He has not had a history of trauma to the urethra. He denies dysuria.   The patient has been seen and evaluated by Dr. Karsten Ro, and was initially scheduled for circumcision. However, the patient has opted for a 2nd opinion.     ALLERGIES: No Allergies    MEDICATIONS: Levothyroxine Sodium 88 mcg tablet  Metformin Hcl 1,000 mg tablet  Aspirin Ec 81 mg tablet, delayed release Oral  Atorvastatin Calcium 80 mg tablet  Betamethasone Dipropionate  Bisoprolol Fumarate 5 mg tablet  Ezetimibe 10 mg tablet  Ferrous Sulfate 325 mg (65 mg iron) tablet  Furosemide 40 mg tablet  Glimepiride 4 mg tablet  Iron 325 mg (65 mg iron) tablet  Levothyroxine Sodium 75 mcg tablet Oral  Oxygen 3L Nightly  Pantoprazole Sodium 40 mg tablet, delayed release  Perforomist 20 mcg/2 ml vial, nebulizer  Potassium Chloride 20 meq tablet, extended release  Yupelri 175 mcg/3 ml vial, nebulizer     GU PSH: Prostate Needle Biopsy - 2017       PSH Notes: Needle Biopsy Of Prostate, Coronary Artery Triple Bypass Graft, CABG, Ankle Surgery   NON-GU PSH: CABG (coronary artery bypass grafting) - 2017     GU PMH: Dysuria, The dysuria he is experiencing is secondary to his extremely severe phimosis which results in ballooning of his foreskin with urination. - 03/23/2019 Phimosis (Worsening, Chronic), He will try to dilate gently with hemostats  and if it gets the point where he is unable to urinate he will contact me as his elective circumcision would then become emergent. - 03/23/2019, (Worsening), His phimosis is pretty severe and I told him I thought there was a low probability that topical steroids would help this time but they were effective in the past and he would like to try that. I told him his options were the topical steroid versus a circumcision. He said he does not want to consider circumcision and I told him that as long as he was not having any discomfort or infections beneath his foreskin and not having any voiding difficulty that was not an absolute necessity. If he has further difficulty he will follow-up with me., - 2018, Phimosis, - 2017 ED due to arterial insufficiency, Erectile dysfunction due to arterial insufficiency - 2017 History of prostate cancer, History of prostate cancer - 2017      PMH Notes: History of prostate cancer: In 2008 he underwent a prostate biopsy due to an elevated PSA and was found to have what he describes as high-grade prostate cancer. He was given the option of surgery or radiation and elected to proceed with radiation and indicates he received a single injection of Lupron. His PSA in 3/17 was 0.31 and he indicates that this has been where his previous readings have been. He has no bone pain or on explain weight loss.   Phimosis: In 5/17 he said he is now able to stand to urinate  but finds that he is unable to retract his foreskin. He has no history of diabetes. He notes that when he urinates there is a slight amount of discomfort at the tip of his penis. He denies discharge.     NON-GU PMH: Atherosclerotic heart disease of native coronary artery without angina pectoris, CAD (coronary artery disease) - 2017 Encounter for general adult medical examination without abnormal findings, Encounter for preventive health examination - 2017 Personal history of other diseases of the circulatory system,  History of hypertension - 2017, History of atrial fibrillation, - 2017 Personal history of other endocrine, nutritional and metabolic disease, History of hypothyroidism - 2017, History of hyperlipidemia, - 2017    FAMILY HISTORY: Dementia - Runs In Family   SOCIAL HISTORY: Marital Status: Married Ethnicity: Not Hispanic Or Latino; Race: White     Notes: Married, Alcohol use, Retired, Caffeine use, Current every day smoker   REVIEW OF SYSTEMS:    GU Review Male:   Patient reports get up at night to urinate and trouble starting your stream. Patient denies frequent urination, hard to postpone urination, burning/ pain with urination, leakage of urine, stream starts and stops, have to strain to urinate , erection problems, and penile pain.  Gastrointestinal (Upper):   Patient denies nausea, vomiting, and indigestion/ heartburn.  Gastrointestinal (Lower):   Patient denies diarrhea and constipation.  Constitutional:   Patient denies fever, night sweats, weight loss, and fatigue.  Skin:   Patient denies skin rash/ lesion and itching.  Eyes:   Patient denies blurred vision and double vision.  Ears/ Nose/ Throat:   Patient denies sore throat and sinus problems.  Hematologic/Lymphatic:   Patient denies swollen glands and easy bruising.  Cardiovascular:   Patient denies leg swelling and chest pains.  Respiratory:   Patient denies shortness of breath and cough.  Endocrine:   Patient denies excessive thirst.  Musculoskeletal:   Patient denies back pain and joint pain.  Neurological:   Patient denies headaches and dizziness.  Psychologic:   Patient denies depression and anxiety.   Notes: nocturia x2    VITAL SIGNS:      09/28/2019 03:44 PM  Weight 251 lb / 113.85 kg  Height 68 in / 172.72 cm  BP 113/67 mmHg  Pulse 73 /min  Temperature 97.3 F / 36.2 C  BMI 38.2 kg/m   GU PHYSICAL EXAMINATION:    Penis: Penis uncircumcised, phimosis. His phimosis is so severe that I can barely discern an  opening.   MULTI-SYSTEM PHYSICAL EXAMINATION:       PAST DATA REVIEWED:  Source Of History:  Patient  Records Review:   Previous Doctor Records, Previous Patient Records, POC Tool   PROCEDURES: None   ASSESSMENT:      ICD-10 Details  1 GU:   Phimosis - N47.1    PLAN:           Document Letter(s):  Created for Patient: Clinical Summary         Notes:   The patient has phimosis with a pinhole opening, which he is otherwise able to void through. We discussed the treatment options including a dorsal slit versus circumcision. The patient and I discussed the risks and benefits of both. Ultimately, the patient would like to go ahead with a circumcision. He does have severe COPD, for which he is on home O2. However, today he is not on any oxygen, and seems to be okay. But I did recommend that we do this under MAC with  a local block. Will try to get this scheduled as soon as possible.

## 2019-10-07 NOTE — Anesthesia Procedure Notes (Signed)
Date/Time: 10/07/2019 10:05 AM Performed by: Sharlette Dense, CRNA

## 2019-10-08 ENCOUNTER — Encounter (HOSPITAL_COMMUNITY): Payer: Self-pay | Admitting: Urology

## 2019-10-14 DIAGNOSIS — N471 Phimosis: Secondary | ICD-10-CM | POA: Diagnosis not present

## 2019-10-16 DIAGNOSIS — N471 Phimosis: Secondary | ICD-10-CM | POA: Diagnosis not present

## 2019-10-20 DIAGNOSIS — N471 Phimosis: Secondary | ICD-10-CM | POA: Diagnosis not present

## 2019-10-30 DIAGNOSIS — I5033 Acute on chronic diastolic (congestive) heart failure: Secondary | ICD-10-CM | POA: Diagnosis not present

## 2019-10-30 DIAGNOSIS — G4733 Obstructive sleep apnea (adult) (pediatric): Secondary | ICD-10-CM | POA: Diagnosis not present

## 2019-11-02 DIAGNOSIS — N471 Phimosis: Secondary | ICD-10-CM | POA: Diagnosis not present

## 2019-11-03 ENCOUNTER — Telehealth: Payer: Self-pay | Admitting: Internal Medicine

## 2019-11-03 NOTE — Telephone Encounter (Signed)
He does not have a BP cuff - I think we have some BP cuffs for patients in need. Can we provide him with one?  -Dr Lemmie Evens

## 2019-11-03 NOTE — Telephone Encounter (Signed)
Called patient back- unsure of what he is referring too for this appointment, he states he was in contact with Dr.Hilty- and was told to just come into the office to be measured due to his blue cross blue shield program.  I was unsure of this, will route to MD and advised that his nurse would contact back. Thank you!

## 2019-11-03 NOTE — Telephone Encounter (Signed)
Patient is calling needing to schedule an appointment regarding being measured for a BP cuff.

## 2019-11-04 NOTE — Telephone Encounter (Signed)
Spoke with patient who reports he needs his arm measured to get a BP cuff from [place] we do not do that in the office. He voiced understanding.

## 2019-11-11 DIAGNOSIS — I4891 Unspecified atrial fibrillation: Secondary | ICD-10-CM | POA: Diagnosis not present

## 2019-11-11 DIAGNOSIS — E7800 Pure hypercholesterolemia, unspecified: Secondary | ICD-10-CM | POA: Diagnosis not present

## 2019-11-11 DIAGNOSIS — E1169 Type 2 diabetes mellitus with other specified complication: Secondary | ICD-10-CM | POA: Diagnosis not present

## 2019-11-24 DIAGNOSIS — N471 Phimosis: Secondary | ICD-10-CM | POA: Diagnosis not present

## 2019-11-29 DIAGNOSIS — I5033 Acute on chronic diastolic (congestive) heart failure: Secondary | ICD-10-CM | POA: Diagnosis not present

## 2019-11-29 DIAGNOSIS — G4733 Obstructive sleep apnea (adult) (pediatric): Secondary | ICD-10-CM | POA: Diagnosis not present

## 2019-12-08 DIAGNOSIS — Z20828 Contact with and (suspected) exposure to other viral communicable diseases: Secondary | ICD-10-CM | POA: Diagnosis not present

## 2019-12-30 DIAGNOSIS — I5033 Acute on chronic diastolic (congestive) heart failure: Secondary | ICD-10-CM | POA: Diagnosis not present

## 2019-12-30 DIAGNOSIS — G4733 Obstructive sleep apnea (adult) (pediatric): Secondary | ICD-10-CM | POA: Diagnosis not present

## 2020-01-10 DIAGNOSIS — L0889 Other specified local infections of the skin and subcutaneous tissue: Secondary | ICD-10-CM | POA: Diagnosis not present

## 2020-01-10 DIAGNOSIS — Z7984 Long term (current) use of oral hypoglycemic drugs: Secondary | ICD-10-CM | POA: Diagnosis not present

## 2020-01-10 DIAGNOSIS — E119 Type 2 diabetes mellitus without complications: Secondary | ICD-10-CM | POA: Diagnosis not present

## 2020-01-10 DIAGNOSIS — L03115 Cellulitis of right lower limb: Secondary | ICD-10-CM | POA: Diagnosis not present

## 2020-01-11 ENCOUNTER — Other Ambulatory Visit: Payer: Self-pay | Admitting: Physician Assistant

## 2020-01-30 DIAGNOSIS — G4733 Obstructive sleep apnea (adult) (pediatric): Secondary | ICD-10-CM | POA: Diagnosis not present

## 2020-01-30 DIAGNOSIS — I5033 Acute on chronic diastolic (congestive) heart failure: Secondary | ICD-10-CM | POA: Diagnosis not present

## 2020-02-10 DIAGNOSIS — Z20828 Contact with and (suspected) exposure to other viral communicable diseases: Secondary | ICD-10-CM | POA: Diagnosis not present

## 2020-02-10 DIAGNOSIS — Z20822 Contact with and (suspected) exposure to covid-19: Secondary | ICD-10-CM | POA: Diagnosis not present

## 2020-02-11 DIAGNOSIS — M25571 Pain in right ankle and joints of right foot: Secondary | ICD-10-CM | POA: Diagnosis not present

## 2020-02-11 DIAGNOSIS — M79671 Pain in right foot: Secondary | ICD-10-CM | POA: Diagnosis not present

## 2020-02-25 ENCOUNTER — Encounter: Payer: Self-pay | Admitting: Podiatry

## 2020-02-25 ENCOUNTER — Ambulatory Visit (INDEPENDENT_AMBULATORY_CARE_PROVIDER_SITE_OTHER): Payer: Federal, State, Local not specified - PPO

## 2020-02-25 ENCOUNTER — Ambulatory Visit (INDEPENDENT_AMBULATORY_CARE_PROVIDER_SITE_OTHER): Payer: Federal, State, Local not specified - PPO | Admitting: Podiatry

## 2020-02-25 ENCOUNTER — Other Ambulatory Visit: Payer: Self-pay

## 2020-02-25 VITALS — Temp 96.7°F

## 2020-02-25 DIAGNOSIS — M779 Enthesopathy, unspecified: Secondary | ICD-10-CM

## 2020-02-25 DIAGNOSIS — M1000 Idiopathic gout, unspecified site: Secondary | ICD-10-CM

## 2020-02-25 DIAGNOSIS — M7750 Other enthesopathy of unspecified foot: Secondary | ICD-10-CM

## 2020-02-25 DIAGNOSIS — E039 Hypothyroidism, unspecified: Secondary | ICD-10-CM | POA: Diagnosis not present

## 2020-02-25 DIAGNOSIS — E1169 Type 2 diabetes mellitus with other specified complication: Secondary | ICD-10-CM | POA: Diagnosis not present

## 2020-02-25 DIAGNOSIS — M7751 Other enthesopathy of right foot: Secondary | ICD-10-CM

## 2020-02-25 DIAGNOSIS — Z79899 Other long term (current) drug therapy: Secondary | ICD-10-CM | POA: Diagnosis not present

## 2020-02-25 DIAGNOSIS — M109 Gout, unspecified: Secondary | ICD-10-CM | POA: Diagnosis not present

## 2020-02-25 DIAGNOSIS — E7800 Pure hypercholesterolemia, unspecified: Secondary | ICD-10-CM | POA: Diagnosis not present

## 2020-02-25 DIAGNOSIS — D509 Iron deficiency anemia, unspecified: Secondary | ICD-10-CM | POA: Diagnosis not present

## 2020-02-25 DIAGNOSIS — I10 Essential (primary) hypertension: Secondary | ICD-10-CM | POA: Diagnosis not present

## 2020-02-25 NOTE — Progress Notes (Signed)
Subjective:   Patient ID: Jeremiah [name], male   DOB: 74 y.o.   MRN: [id number]   HPI Patient presents stating is had pain in his right big toe joint and he has flatfeet that he knows he needs orthotics for.  States that it is been gradually becoming more irritated and harder to work with and patient had stretching exercises but not helping   ROS      Objective:  Physical Exam  Neurovascular status intact with moderate flatfoot deformity noted bilateral with tendinitis-like symptoms with inflammation pain around the first MPJ right with redness and swelling that has been worse recently and is not as prevalent but the foot is still sore     Assessment:  Inflammatory capsulitis of the right first MPJ with probable gout-like symptomatology with flatfoot tenderness symptoms     Plan:  H&P conditions reviewed today I did a sterile prep and I went ahead and injected around the first MPJ 3 mg Kenalog 5 mg Xylocaine and I am referring him to [facility] for a [place] type orthotic and patient will be casted for these to try to help support his arch with moderate obesity being complicating factor  X-ray right did indicate some arthritis around the first MPJ and also possibility for gout so I did write him out sheets on gout and I educated him on the possibility for gout for this condition

## 2020-02-27 DIAGNOSIS — G4733 Obstructive sleep apnea (adult) (pediatric): Secondary | ICD-10-CM | POA: Diagnosis not present

## 2020-02-27 DIAGNOSIS — I5033 Acute on chronic diastolic (congestive) heart failure: Secondary | ICD-10-CM | POA: Diagnosis not present

## 2020-02-27 IMAGING — CR DG CHEST 2V
3 series · 3 of 3 positions shown · non-contrast
Comparison: 02/25/2017

CLINICAL DATA: Shortness of breath for 1 week. Congestive heart
failure. Coronary artery disease.

EXAM:
CHEST - 2 VIEW

[w chest pa (1 of 2)]
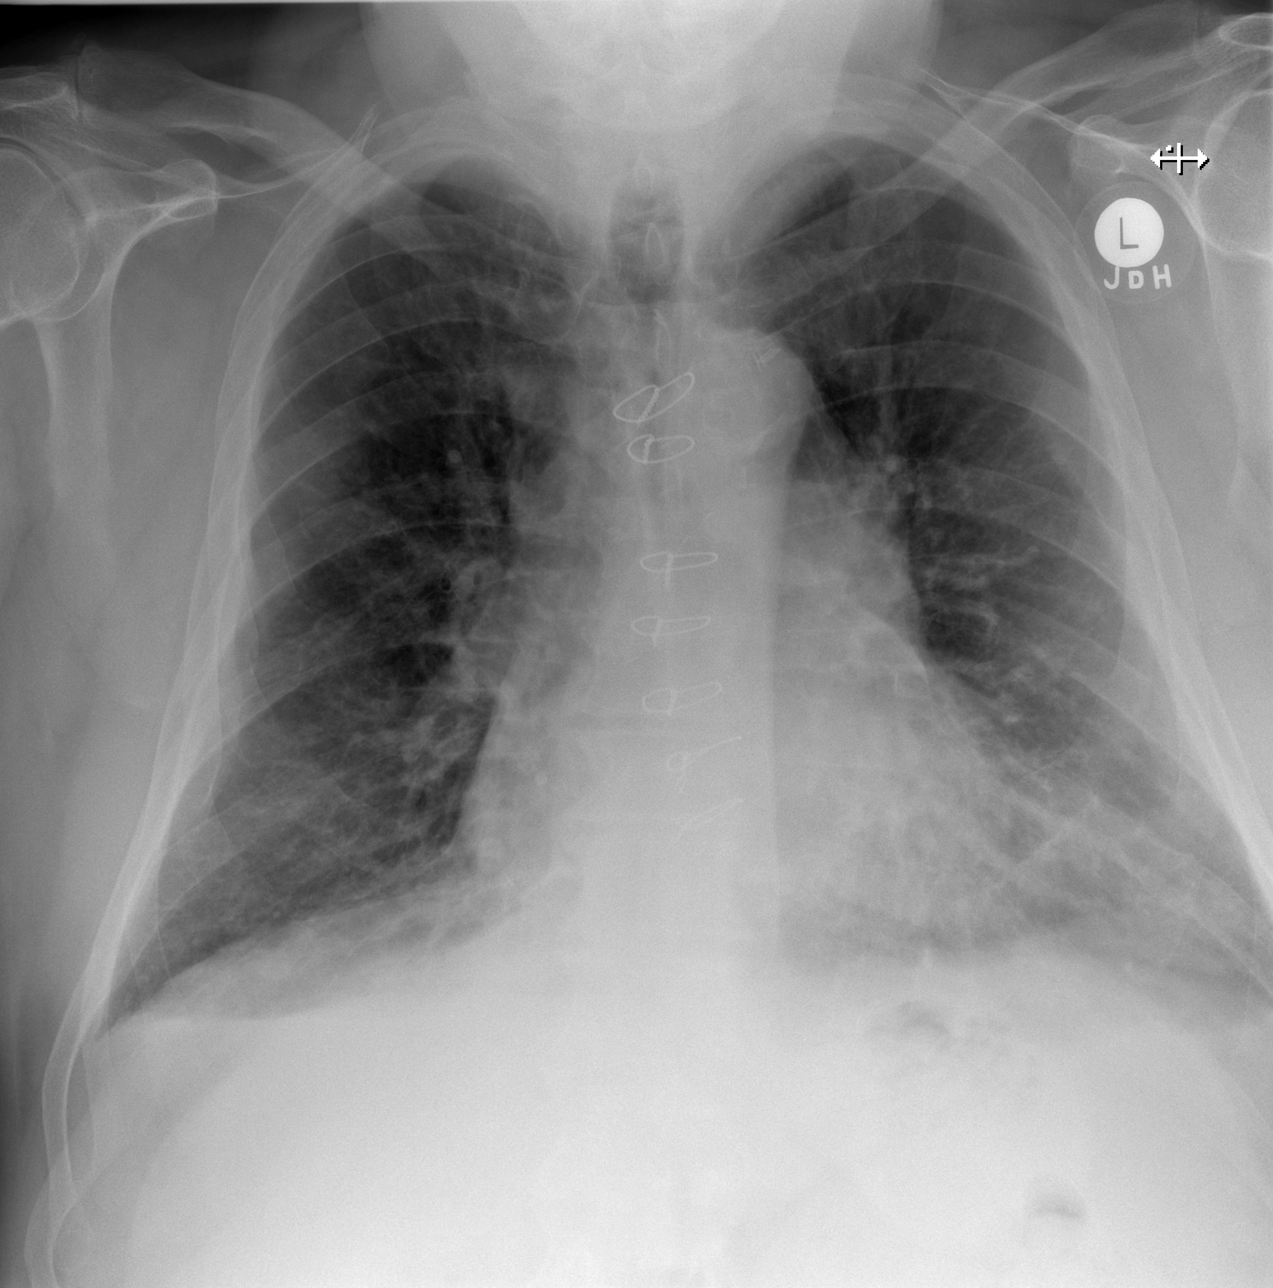

[w chest pa (2 of 2)]
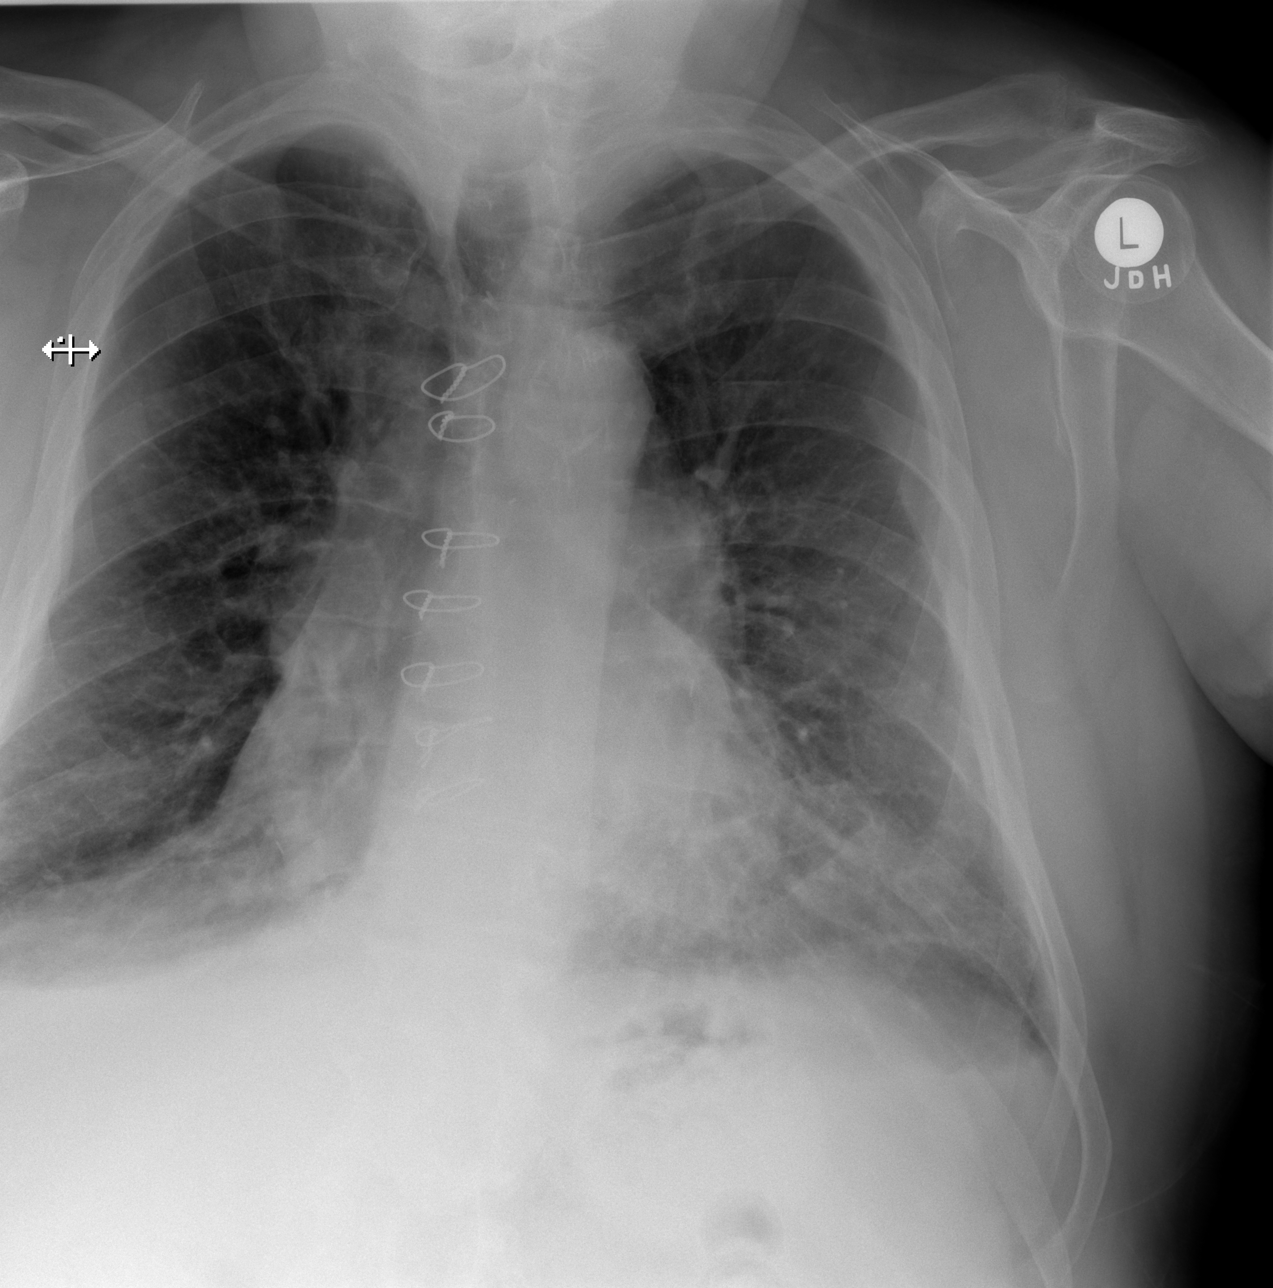

[w chest lat]
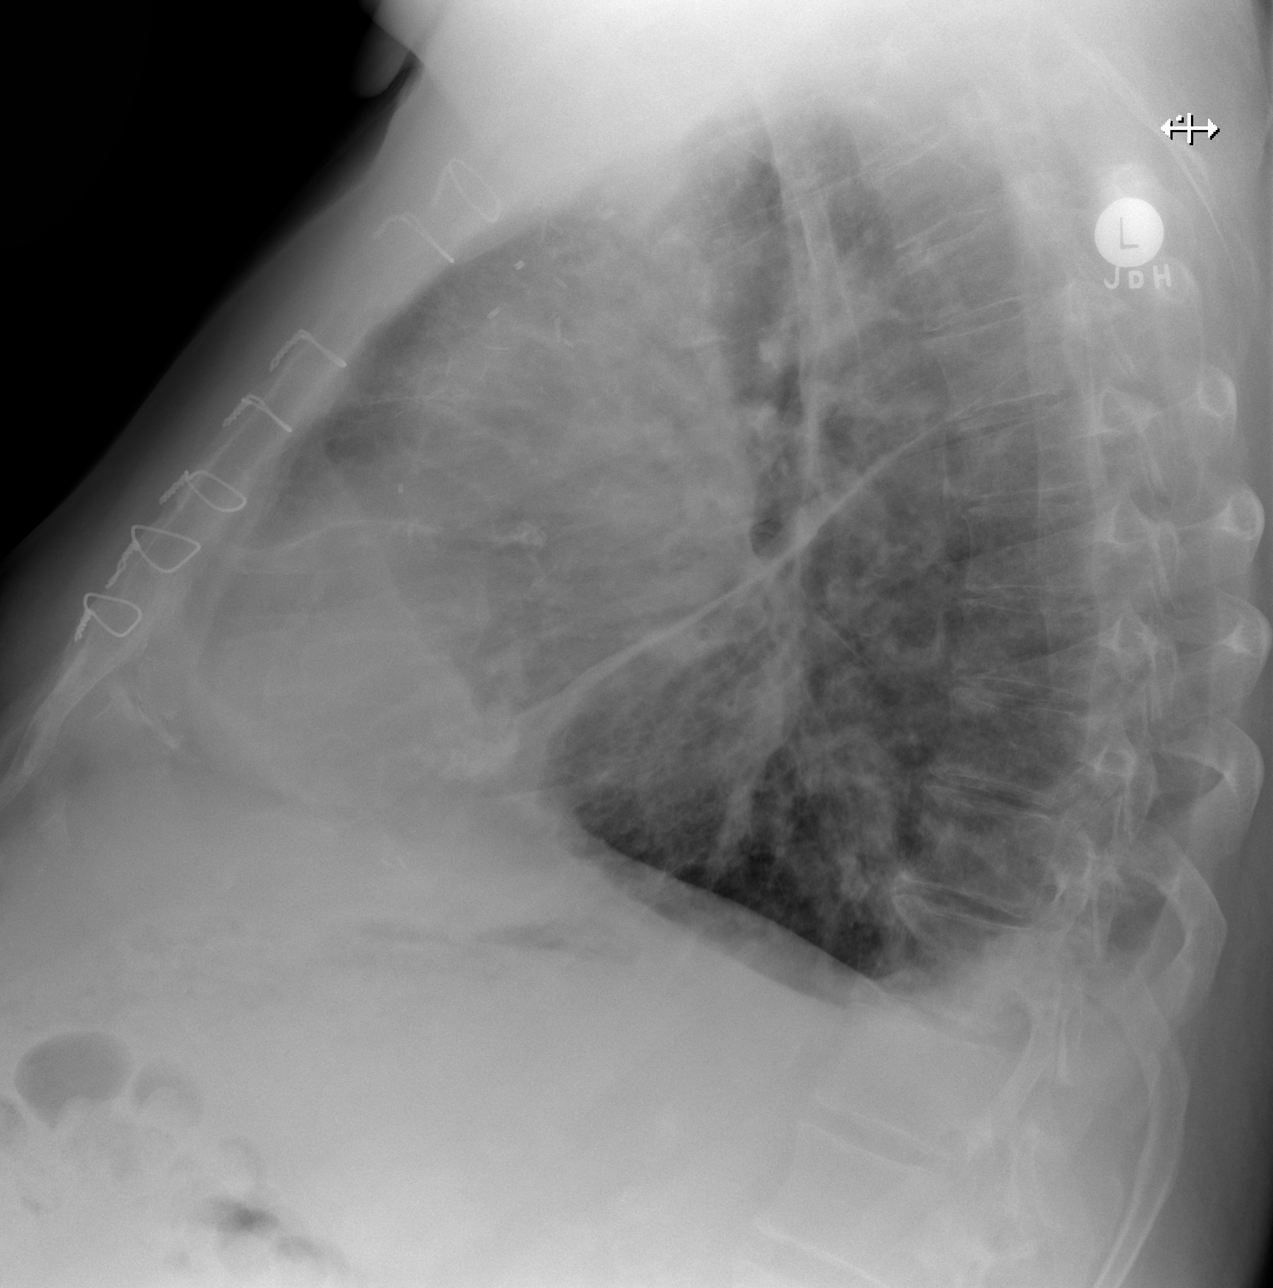

[3 of 3 positions shown; findings below may reference images not displayed]

FINDINGS: Stable mild cardiomegaly. Prior CABG. Stable pulmonary interstitial
prominence. Stable mild bilateral pleural thickening. No evidence of
acute infiltrate.
IMPRESSION: Stable cardiomegaly and pleural thickening.  No acute findings.

## 2020-03-02 ENCOUNTER — Ambulatory Visit: Payer: Federal, State, Local not specified - PPO | Admitting: Internal Medicine

## 2020-03-02 ENCOUNTER — Encounter: Payer: Self-pay | Admitting: Internal Medicine

## 2020-03-02 ENCOUNTER — Other Ambulatory Visit: Payer: Self-pay

## 2020-03-02 VITALS — BP 131/72 | HR 90 | Ht 68.0 in | Wt 257.0 lb

## 2020-03-02 DIAGNOSIS — I5032 Chronic diastolic (congestive) heart failure: Secondary | ICD-10-CM | POA: Diagnosis not present

## 2020-03-02 DIAGNOSIS — I25810 Atherosclerosis of coronary artery bypass graft(s) without angina pectoris: Secondary | ICD-10-CM | POA: Diagnosis not present

## 2020-03-02 DIAGNOSIS — R0602 Shortness of breath: Secondary | ICD-10-CM | POA: Diagnosis not present

## 2020-03-02 DIAGNOSIS — R9431 Abnormal electrocardiogram [ECG] [EKG]: Secondary | ICD-10-CM

## 2020-03-02 NOTE — Patient Instructions (Signed)
Medication Instructions:  Your physician recommends that you continue on your current medications as directed. Please refer to the Current Medication list given to you today.  *If you need a refill on your cardiac medications before your next appointment, please call your pharmacy*  Testing/Procedures: Dr. Debara Pickett has ordered a Lexiscan Myoview stress test. This will be done at [address]   Follow-Up: At [facility], you and your health needs are our priority.  As part of our continuing mission to provide you with exceptional heart care, we have created designated Provider Care Teams.  These Care Teams include your primary Cardiologist (physician) and Advanced Practice Providers (APPs -  Physician Assistants and Nurse Practitioners) who all work together to provide you with the care you need, when you need it.  We recommend signing up for the patient portal called "MyChart".  Sign up information is provided on this After Visit Summary.  MyChart is used to connect with patients for Virtual Visits (Telemedicine).  Patients are able to view lab/test results, encounter notes, upcoming appointments, etc.  Non-urgent messages can be sent to your provider as well.   To learn more about what you can do with MyChart, go to [email or web address].    Your next appointment:   12 month(s)  The format for your next appointment:   In Person  Provider:   You may see Pixie Casino, MD or one of the following Advanced Practice Providers on your designated Care Team:    Almyra Deforest, PA-C  Fabian Sharp, PA-C or   Roby Lofts, [place]    Other Instructions

## 2020-03-02 NOTE — Progress Notes (Signed)
OFFICE NOTE  Chief Complaint:  Routine follow-up  Primary Care Physician: Jeremiah Amel, MD  HPI:  Jeremiah [name] is a 74 y.o. 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 He is originally from [place] and his wife who is accompanying him today is originally from [place]. Jeremiah [name] had an MI in 2006 and underwent coronary artery bypass grafting with a LIMA to LAD, SVG to OM1, and SVG to RPDA. He did fairly well with this however 2015 was having chest discomfort. He underwent a nuclear stress test which showed possible reversible ischemia. Therefore he was referred for cardiac catheterization at [facility] [place]. That demonstrated all 3 patent bypass grafts with multivessel coronary disease. At that time he was also having episodes of palpitations and a monitor demonstrated PSVT. He was scheduled for possible ablation but is had chronic problems with an ankle fracture and nonhealing wound. He therefore never underwent ablation. He was placed on digoxin and has been on metoprolol but stopped the digoxin at some point in the past. He reports over the past 8-12 months she's had no further palpitations. He also has a history of dyslipidemia, hypothyroidism, and borderline diabetes. Recently he had a sleep study through [facility] which demonstrated severe obstructive sleep apnea and BiPAP was recommended with settings of 19/14 cm water pressure. Currently he denies any chest pain or worsening shortness of breath. His last echocardiogram from his cardiologist in [place] was a 2015 which showed an EF of 58% and no significant valvular disease.  07/26/2016  Jeremiah [name] returns today for follow-up. He was recently seen in the emergency department in the beginning of June for chest pain. This was associated with tachycardia and probable SVT. Troponin was noted to be elevated to 0.12. He had signs and symptoms concerning for unstable angina. He was evaluated  by my partners and felt to need cardiac catheterization. Eventually he underwent cardiac catheterization by Jeremiah [name] with the results as follows:  Conclusion   1. LM lesion, 85% stenosed. 2. Ost 1st Mrg lesion, 100% stenosed. 3. SVG was injected . 4. Ost RCA to Prox RCA lesion, 100% stenosed. 5. SVG . 6. Origin to Prox Graft lesion, 40% stenosed. 7. Mid RCA to Dist RCA lesion, 100% stenosed. 8. Prox LAD lesion, 100% stenosed. 9. LIMA .    Patent saphenous vein grafts. SVG to PDA contains eccentric 40% proximal stenosis. SVG to the circumflex is widely patent.  LIMA to the LAD is widely patent.  Native distal left main contains 75% stenosis. The dominant obtuse marginal branch is totally occluded. The native right coronary is totally occluded.  Left ventriculogram demonstrates inferior wall hypokinesis. EF 50%.  Recent chest pain in the setting of PSVT with minimal enzyme elevation likely related to demand ischemia in the setting of underlying native coronary disease.  Recommendations:  Medical therapy.  Management of PSVT with medications versus ablation.   Since discharge she denies any further episodes of tachycardia palpitations. He is already on a high dose of beta blocker. He's had a small amount of weight gain but has stopped smoking.  He plans to start to do more exercise and work on weight loss. I'm concerned about his recurrent palpitations and the fact that asymptomatic and has demand ischemia related to them and bypass graft insufficiency.  06/10/2017  Jeremiah [name] returns today for follow-up. It's been almost a year since I last saw him. He was found to have recurrent symptomatic  SVT and underwent comprehensive EP study and was diagnosed with classic AV nodal reentry tachycardia and subsequently underwent selective radiofrequency ablation by Dr. Curt [name]. He says since that time he's had no further episodes of tachypalpitations. Since then he was hospitalized twice, after  developing C. difficile colitis and enteritis with GI bleed in January. Subsequently he developed acute congestive heart failure was found to have a mild reduction in LVEF to 45-50% by echo in March 2018. At that time he was on furosemide 40 mg daily. He had a follow-up with Jeremiah Deforest, PA-C in April who noted he was on Lasix 40 mg twice daily, however he reported that he was not taking that to me today. Fact he says he is now off of Lasix. He reports worsening shortness of breath and hasn't fact had a 13 pound weight gain since April. He denies any lower extremity edema. He has been working with Jeremiah [name] for his shortness of breath.  07/22/2017  Jeremiah [name] is back today for follow-up. He restarted his Lasix at my direction his weight is now down from 252-244. He's had significant improvement in swelling and reports some improvement in his breathing however he says it still not good. He does have a mildly reduced EF of 45-50% but also has some chronic lung disease which is likely contributing. He says he gets short of breath while doing certain activities in the yard and then comes inside and uses his oxygen which improves him almost immediately. He denies any orthopnea. He has not had any recent or productive cough. He has a follow-up visit with his pulmonologist tomorrow. I had ordered lab work including a metabolic profile and BNP which was never obtained.  09/09/2018  Jeremiah [name] is seen today in follow-up.  He reports some improvement in his shortness of breath.  He has joined a gym and now is exercising every day.  Weight however has been stable at 252, actually up 2 pounds from April.  Despite this his breathing has improved somewhat.  He still uses oxygen.  He did have one episode recently where he became nauseated and vomited.  He was up at the [place] and had been taking antibiotics for a dental infection.  He did undergo CT scan of the head and neck and that demonstrated bilateral carotid  artery calcification.  This is not surprising given his history of coronary artery disease and prior CABG however he has not had prior carotid Dopplers.    02/18/2019  Jeremiah [name] is seen today in follow-up.  Recently he says has had some improvement in shortness of breath.  Jeremiah [name], his pulmonologist had started him on a new nebulizer called revefenacin.  His most recent hemoglobin A1c was 7.1.  Weight is down about 4 pounds.  He denies any new chest pain.  He has had some recent anemia and work-up is apparently underway for this.  He is tentatively scheduled for an endoscopy on April 16 with Dr. Therisa Doyne.  He is concerned because of his pulmonary status that he may be at high risk of decompensation with the procedure.  He also has follow-up with his PCP in the near future.  09/14/2019  Jeremiah [name] returns today for follow-up.  He continues to do well.  His shortness of breath has improved which I think was both believed due to a combination of pulmonary optimization as well as improvement in LV function.  Recently his echo in fact showed LVEF has normalized to 60 to 65%.  He remains euvolemic and is actually 2 pounds lighter than he was seen about 8 months ago.  Hemoglobin A1c has been reasonably well-controlled however his lipids are still uncontrolled.  His total cholesterol is 211, triglycerides 494, HDL 32 and LDL of 114.  His target LDL is less than 70 and his elevated triglycerides also put him at increased cardiovascular risk.  03/02/2020  Jeremiah [name] is seen today in follow-up.  He denies any chest pain but does have some shortness of breath.  Is been some weight gain.  He reports less physical activity has been struggling with a long recovery after undergoing circumcision for issues with phimosis.  Its unfortunate that he had a difficult recovery but seems to be doing better.  Blood pressure was well controlled today.  His EKG demonstrated some worsening inferior and lateral T wave inversions.  This  was previously seen however not as significant.  His last heart cath was in 2017 which did show a patent SVG to OM and occluded SVG to right coronary.  He is anticipating restarting an exercise program and working with a Clinical research associate.  PMHx:  Past Medical History:  Diagnosis Date  . A-fib ([facility])   . Anemia   . Aortic valve sclerosis 12/2018   Noted on ECHO  . Arthritis   . CAD (coronary artery disease)    a. 2006: CABG in 2006 with LIMA-LAD, SVG-OM1, and SVG-RPDA  . Cardiomegaly 12/2018   Stable, noted on CXR  . Carpal tunnel syndrome    Right  . Chronic pain 03/21/2016  . Colon cancer ([facility]) 2006  . COPD (chronic obstructive pulmonary disease) ([facility])   . Diabetes mellitus without complication ([facility])   . DVT (deep venous thrombosis) ([facility])    Right  . Essential hypertension 03/21/2016  . GERD (gastroesophageal reflux disease)   . History of blood transfusion   . History of Clostridioides difficile infection   . History of prostate cancer 03/21/2016  . History of PSVT (paroxysmal supraventricular tachycardia)   . HLD (hyperlipidemia)   . HTN (hypertension)   . Hx of CABG 2006  . Hypercholesteremia 03/21/2016  . Hypothyroidism   . LAE (left atrial enlargement) 12/2018   Severe, Noted on ECHO  . LVH (left ventricular hypertrophy) 12/2018   Mild, Noted on ECHO  . Medication management 03/21/2016  . Mitral annular calcification 12/2018   with mild MS, Noted on ECHO  . Morbid obesity ([facility]) 03/21/2016  . Myocardial infarct ([facility])   . OSA (obstructive sleep apnea) 03/21/2016   uses oxygen at night time  . Pain in right ankle and joints of right foot 03/21/2016  . Paronychia 03/21/2016  . Phimosis   . Pneumonia   . Primary insomnia 03/21/2016  . Prostate cancer ([facility]) 2008  . Pulmonary hypertension ([facility]) 12/2018   Moderate, Noted on ECHO  . Tobacco dependence 03/21/2016  . Tricuspid regurgitation 12/2018   Mild, Noted on ECHO    Past Surgical History:  Procedure Laterality Date  . ANKLE  SURGERY Right 12/2013  . CARDIAC CATHETERIZATION N/A 05/08/2016   Procedure: Left Heart Cath and Cors/Grafts Angiography;  Surgeon: Belva Crome, MD;  Location: [facility];  Service: Cardiovascular;  Laterality: N/A;  . CIRCUMCISION N/A 10/07/2019   Procedure: CIRCUMCISION ADULT;  Surgeon: Ardis Hughs, MD;  Location: [facility];  Service: Urology;  Laterality: N/A;  . CORONARY ARTERY BYPASS GRAFT  2006   x3  . ELECTROPHYSIOLOGIC STUDY N/A 08/24/2016   Procedure: SVT Ablation;  Surgeon: Will Meredith Leeds, MD;  Location: [facility];  Service: Cardiovascular;  Laterality: N/A;  . PROSTATE SURGERY  2008  . PTCA      FAMHx:  Family History  Problem Relation Age of Onset  . Dementia Mother   . Diabetes Sister     SOCHx:   reports that he quit smoking about 3 years ago. His smoking use included cigarettes. He has a 30.00 pack-year smoking history. He has never used smokeless tobacco. He reports previous alcohol use. He reports that he does not use drugs.  ALLERGIES:  Allergies  Allergen Reactions  . Scallops [Shellfish Allergy] Other (See Comments)    Rash; shortness of breath  . Fluzone [Flu Virus Vaccine] Nausea And Vomiting, Palpitations and Rash    ROS: Pertinent items noted in HPI and remainder of comprehensive ROS otherwise negative.  HOME MEDS: Current Outpatient Medications  Medication Sig Dispense Refill  . acetaminophen-codeine (TYLENOL #2) 300-15 MG tablet Take 1-2 tablets by mouth every 4 (four) hours as needed for moderate pain. 15 tablet 0  . albuterol (PROVENTIL) (2.5 MG/3ML) 0.083% nebulizer solution Take 3 mLs (2.5 mg total) by nebulization every 4 (four) hours as needed for wheezing or shortness of breath. 75 mL 12  . aspirin EC 81 MG tablet Take 81 mg by mouth daily.    Marland Kitchen atorvastatin (LIPITOR) 80 MG tablet Take 80 mg by mouth daily.    . betamethasone dipropionate 0.05 % lotion Apply 1 application topically 2 (two) times daily.     .  bisoprolol (ZEBETA) 5 MG tablet TAKE 1 TABLET(5 MG) BY MOUTH DAILY (Patient taking differently: Take 5 mg by mouth daily. TAKE 1 TABLET(5 MG) BY MOUTH DAILY) 90 tablet 0  . ferrous sulfate (FEROSUL) 325 (65 FE) MG tablet Take 1 tablet (325 mg total) by mouth daily with breakfast. 30 tablet 9  . formoterol (PERFOROMIST) 20 MCG/2ML nebulizer solution Take 2 mLs (20 mcg total) by nebulization 2 (two) times daily. 120 mL 11  . furosemide (LASIX) 40 MG tablet Take 1.5 tablets (60 mg total) by mouth daily. 135 tablet 2  . glimepiride (AMARYL) 4 MG tablet Take 4 mg by mouth daily with breakfast.    . levothyroxine (SYNTHROID, LEVOTHROID) 88 MCG tablet Take 88 mcg by mouth daily before breakfast.    . metFORMIN (GLUCOPHAGE-XR) 500 MG 24 hr tablet Take 1,000 mg by mouth 2 (two) times daily.     . OXYGEN Inhale 3 L into the lungs at bedtime. 4 lpm with 24/7 AHC    . pantoprazole (PROTONIX) 40 MG tablet Take 40 mg by mouth daily.    . potassium chloride (KLOR-CON) 20 MEQ packet Take 20 mEq by mouth daily.    . Revefenacin (YUPELRI) 175 MCG/3ML SOLN Inhale 1 vial into the lungs daily. 90 mL 11  . traZODone (DESYREL) 50 MG tablet Take 50 mg by mouth at bedtime as needed for sleep.     Marland Kitchen ezetimibe (ZETIA) 10 MG tablet Take 1 tablet (10 mg total) by mouth daily. 90 tablet 3   No current facility-administered medications for this visit.    LABS/IMAGING: No results found for this or any previous visit (from the past 48 hour(s)). No results found.  WEIGHTS: Wt Readings from Last 3 Encounters:  03/02/20 257 lb (116.6 kg)  10/01/19 247 lb 4 oz (112.2 kg)  09/14/19 250 lb 12.8 oz (113.8 kg)    VITALS: BP 131/72   Pulse 90   Ht 5\' 8"  (  1.727 m)   Wt 257 lb (116.6 kg)   SpO2 95%   BMI 39.08 kg/m   EXAM: General appearance: alert, no distress and moderately obese Neck: no carotid bruit and no JVD Lungs: diminished breath sounds bilaterally and wheezes bilaterally Heart: regular rate and rhythm, S1,  S2 normal, no murmur, click, rub or gallop Abdomen: soft, non-tender; bowel sounds normal; no masses,  no organomegaly Extremities: extremities normal, atraumatic, no cyanosis or edema Pulses: 2+ and symmetric Skin: Skin color, texture, turgor normal. No rashes or lesions Neurologic: Grossly normal Psych: Pleasant  EKG: Normal sinus rhythm with inferior and lateral deep T wave inversions 78-personally reviewed  ASSESSMENT: 1. Worsening inferior and lateral T wave inversions, dyspnea 2. Recent SVT which was symptomatic-repeat cardiac catheterization (05/2016) shows patent grafts, status post ablation of AV nodal reentry tachycardia (08/2016) 3. CAD status post three-vessel CABG in 2006 (LIMA to LAD, SVG to OM1, SVG to RPDA) in [place] 4. Patent bypass grafts by cath in 2015 5. History of PSVT-on metoprolol 6. Dyslipidemia on Lipitor 7. OSA-BiPAP recommended 8. COPD 9. Chronic systolic congestive heart failure-LVEF 45-50% -improved to 60 to 65% (12/2018)  10. Phimosis  PLAN: 1.   Jeremiah [name] has developed worsening inferior and lateral T wave inversions.  There may be an association with weight gain however he reports some shortness of breath.  His EF had improved and 2020.  He has known occlusion of the SVG to RCA by cath in 2017 however had a patent SVG to OM.  I would like to repeat a Myoview stress test to see if he has any evidence of lateral wall ischemia.  This would be important to determine the safety of him restarting it vigorous exercise program.  Follow-up with me annually or sooner if his Myoview is abnormal.  Pixie Casino, MD, [facility], [place] Director of the [facility] Lipid Disorders &  Cardiovascular Risk Reduction Clinic Diplomate of the American Board of Clinical Lipidology Attending Cardiologist  Direct Dial: [phone]  Fax: [phone]  Website:  [email or web address] Urquilla 03/02/2020, 4:00 PM

## 2020-03-04 ENCOUNTER — Telehealth (HOSPITAL_COMMUNITY): Payer: Self-pay | Admitting: *Deleted

## 2020-03-04 NOTE — Telephone Encounter (Signed)
Patient given detailed instructions per Myocardial Perfusion Study Information Sheet for the test on 03/09/20 at 8:15. Patient notified to arrive 15 minutes early and that it is imperative to arrive on time for appointment to keep from having the test rescheduled.  If you need to cancel or reschedule your appointment, please call the office within 24 hours of your appointment. . Patient verbalized understanding.Jeremiah [name]

## 2020-03-07 ENCOUNTER — Telehealth: Payer: Self-pay | Admitting: Podiatry

## 2020-03-07 DIAGNOSIS — L739 Follicular disorder, unspecified: Secondary | ICD-10-CM | POA: Diagnosis not present

## 2020-03-07 NOTE — Telephone Encounter (Signed)
Pts wife left message that pt does not want to get orthotics and to cancel the appt that I had left message for them to r/s. She stated that he got some a year or so ago and was not able to wear them.  I returned call and spoke to wife and she said pt does not want to see [facility] and would like a referral to somewhere else. She said pt has no confidence in [facility].

## 2020-03-07 NOTE — Telephone Encounter (Signed)
Dr. Paulla Dolly and Dawn:  I called patient back to offer them to see [facility] instead of [facility]. Patient declined to see [facility] as well.  I told them we could give them a prescription for [facility] and they said they would prefer to go there.  Dr. Paulla Dolly -- patient only needs Diabetic Shoe Inserts only. They do not want to get diabetic shoes.  I told patient I would have you fill this prescription out on Monday when you get back into the office. I will call patient to let them know when the prescription is ready.  Thank you,  Rolly Pancake, CMA (AAMA)

## 2020-03-08 ENCOUNTER — Encounter (HOSPITAL_COMMUNITY): Payer: Federal, State, Local not specified - PPO

## 2020-03-09 ENCOUNTER — Ambulatory Visit (HOSPITAL_COMMUNITY): Payer: Federal, State, Local not specified - PPO | Attending: Cardiology

## 2020-03-09 ENCOUNTER — Other Ambulatory Visit: Payer: Self-pay

## 2020-03-09 DIAGNOSIS — I25810 Atherosclerosis of coronary artery bypass graft(s) without angina pectoris: Secondary | ICD-10-CM | POA: Insufficient documentation

## 2020-03-09 DIAGNOSIS — R0602 Shortness of breath: Secondary | ICD-10-CM | POA: Diagnosis not present

## 2020-03-09 DIAGNOSIS — R9431 Abnormal electrocardiogram [ECG] [EKG]: Secondary | ICD-10-CM | POA: Diagnosis not present

## 2020-03-09 LAB — MYOCARDIAL PERFUSION IMAGING
LV dias vol: 108 mL (ref 62–150)
LV sys vol: 45 mL
Peak HR: 72 {beats}/min
Rest HR: 57 {beats}/min
SDS: 2
SRS: 4
SSS: 6
TID: 1.16

## 2020-03-09 MED ORDER — REGADENOSON 0.4 MG/5ML IV SOLN
0.4000 mg | Freq: Once | INTRAVENOUS | Status: AC
  Administered 2020-03-09: 0.4 mg via INTRAVENOUS

## 2020-03-09 MED ORDER — TECHNETIUM TC 99M TETROFOSMIN IV KIT
10.5000 | PACK | Freq: Once | INTRAVENOUS | Status: AC | PRN
  Administered 2020-03-09: 10.5 via INTRAVENOUS
  Filled 2020-03-09: qty 11

## 2020-03-09 MED ORDER — TECHNETIUM TC 99M TETROFOSMIN IV KIT
31.0000 | PACK | Freq: Once | INTRAVENOUS | Status: AC | PRN
  Administered 2020-03-09: 31 via INTRAVENOUS
  Filled 2020-03-09: qty 31

## 2020-03-10 ENCOUNTER — Other Ambulatory Visit: Payer: Federal, State, Local not specified - PPO | Admitting: Orthotics

## 2020-03-14 ENCOUNTER — Telehealth: Payer: Self-pay | Admitting: *Deleted

## 2020-03-14 NOTE — Telephone Encounter (Signed)
Called patient today to discuss where he could get diabetic shoes from. Patient was not happy with [facility], who does our diabetic shoes, inserts and braces.  I told patient that, "[employer] [facility] [employer] do no do diabetic shoes. That he could see [facility] instead of [facility]". I also told patient that I called [facility] in [place]. They would have to get Diabetic Shoes with inserts there and use their brand name, which is Gautier. If he would want something different, he would have to pay a higher price".  Patient stated, "He was going to call around and do his own research to see if he could find someone else. If he needs a prescription for this, he will contact us back."  Patient thanked me for trying to find a different solution for him.

## 2020-03-15 DIAGNOSIS — L308 Other specified dermatitis: Secondary | ICD-10-CM | POA: Diagnosis not present

## 2020-03-15 DIAGNOSIS — L720 Epidermal cyst: Secondary | ICD-10-CM | POA: Diagnosis not present

## 2020-03-21 ENCOUNTER — Ambulatory Visit: Payer: Federal, State, Local not specified - PPO | Admitting: Internal Medicine

## 2020-03-29 ENCOUNTER — Other Ambulatory Visit: Payer: Self-pay | Admitting: Internal Medicine

## 2020-03-29 DIAGNOSIS — G4733 Obstructive sleep apnea (adult) (pediatric): Secondary | ICD-10-CM | POA: Diagnosis not present

## 2020-03-29 DIAGNOSIS — I5033 Acute on chronic diastolic (congestive) heart failure: Secondary | ICD-10-CM | POA: Diagnosis not present

## 2020-03-29 MED ORDER — BISOPROLOL FUMARATE 5 MG PO TABS: 5.0000 mg | ORAL_TABLET | Freq: Every day | ORAL | 3 refills | Status: DC

## 2020-03-29 NOTE — Telephone Encounter (Signed)
*  STAT* If patient is at the pharmacy, call can be transferred to refill team.   1. Which medications need to be refilled? (please list name of each medication and dose if known)   bisoprolol (ZEBETA) 5 MG tablet     2. Which pharmacy/location (including street and city if local pharmacy) is medication to be sent to? [facility] [place], [place] AT [address]  3. Do they need a 30 day or 90 day supply? 90 day supply

## 2020-03-29 NOTE — Telephone Encounter (Signed)
Rx(s) sent to pharmacy electronically.  

## 2020-04-26 DIAGNOSIS — L218 Other seborrheic dermatitis: Secondary | ICD-10-CM | POA: Diagnosis not present

## 2020-04-26 DIAGNOSIS — L738 Other specified follicular disorders: Secondary | ICD-10-CM | POA: Diagnosis not present

## 2020-04-26 DIAGNOSIS — D225 Melanocytic nevi of trunk: Secondary | ICD-10-CM | POA: Diagnosis not present

## 2020-04-26 DIAGNOSIS — L821 Other seborrheic keratosis: Secondary | ICD-10-CM | POA: Diagnosis not present

## 2020-04-26 DIAGNOSIS — L570 Actinic keratosis: Secondary | ICD-10-CM | POA: Diagnosis not present

## 2020-04-26 DIAGNOSIS — L820 Inflamed seborrheic keratosis: Secondary | ICD-10-CM | POA: Diagnosis not present

## 2020-04-27 ENCOUNTER — Ambulatory Visit (INDEPENDENT_AMBULATORY_CARE_PROVIDER_SITE_OTHER): Payer: Federal, State, Local not specified - PPO | Admitting: Internal Medicine

## 2020-04-27 ENCOUNTER — Other Ambulatory Visit: Payer: Self-pay

## 2020-04-27 ENCOUNTER — Encounter: Payer: Self-pay | Admitting: Internal Medicine

## 2020-04-27 VITALS — BP 116/58 | HR 86 | Temp 98.7°F | Ht 68.0 in | Wt 250.0 lb

## 2020-04-27 DIAGNOSIS — E1121 Type 2 diabetes mellitus with diabetic nephropathy: Secondary | ICD-10-CM | POA: Diagnosis not present

## 2020-04-27 DIAGNOSIS — N1831 Chronic kidney disease, stage 3a: Secondary | ICD-10-CM

## 2020-04-27 DIAGNOSIS — E1165 Type 2 diabetes mellitus with hyperglycemia: Secondary | ICD-10-CM | POA: Diagnosis not present

## 2020-04-27 DIAGNOSIS — E1122 Type 2 diabetes mellitus with diabetic chronic kidney disease: Secondary | ICD-10-CM

## 2020-04-27 DIAGNOSIS — E781 Pure hyperglyceridemia: Secondary | ICD-10-CM | POA: Diagnosis not present

## 2020-04-27 MED ORDER — RYBELSUS 3 MG PO TABS: 3.0000 mg | ORAL_TABLET | Freq: Every day | ORAL | 6 refills | Status: DC

## 2020-04-27 NOTE — Progress Notes (Signed)
Name: Jeremiah [name]  MRN/ DOB: [id number], [birth year]   Age/ Sex: 74 y.o., male    PCP: [name], MD   Reason for Endocrinology Evaluation: Type 2 Diabetes Mellitus     Date of Initial Endocrinology Visit: 04/29/2020     PATIENT IDENTIFIER: Jeremiah [name] is a 74 y.o. male with a past medical history of T2DM, HTN, CAD , CHF , OSA and dyslipidemia. The patient presented for initial endocrinology clinic visit on 04/29/2020 for consultative assistance with his diabetes management.    HPI: Jeremiah [name] was    Diagnosed with DM in 2017 Prior Medications tried/Intolerance:  Currently checking blood sugars 1 x / day Hypoglycemia episodes : no             Hemoglobin A1c has ranged from 7.1 % in 2020, peaking at 9.6% in 2021. Patient required assistance for hypoglycemia: no Patient has required hospitalization within the last 1 year from hyper or hypoglycemia: no  In terms of diet, the patient eats one main meal at supper, eats fruits for breakfast and lunch. Drinks orange juice ,apple juice, drinks unsweetened tea   HOME DIABETES REGIMEN: Metformin 500 mg ER , 2 tabs BID Glimepiride 4 mg daily  Rybelsus 3 mg daily - on his list but not taking    Statin: yes ACE-I/ARB: No Prior Diabetic Education: yes   METER DOWNLOAD SUMMARY:  Unable to download  DIABETIC COMPLICATIONS: Microvascular complications:   CKD III  Denies: Retinopathy, neuropathy  Last eye exam: Completed  04/2020  Macrovascular complications:    CAD (S/P CABG 2006), CHF   Denies: PVD, CVA   PAST HISTORY: Past Medical History:  Past Medical History:  Diagnosis Date  . A-fib ([place])   . Anemia   . Aortic valve sclerosis 12/2018   Noted on ECHO  . Arthritis   . CAD (coronary artery disease)    a. 2006: CABG in 2006 with LIMA-LAD, SVG-OM1, and SVG-RPDA  . Cardiomegaly 12/2018   Stable, noted on CXR  . Carpal tunnel syndrome    Right  . Chronic pain 03/21/2016  . Colon cancer ([place]) 2006  . COPD  (chronic obstructive pulmonary disease) ([place])   . Diabetes mellitus without complication ([place])   . DVT (deep venous thrombosis) ([facility])    Right  . Essential hypertension 03/21/2016  . GERD (gastroesophageal reflux disease)   . History of blood transfusion   . History of Clostridioides difficile infection   . History of prostate cancer 03/21/2016  . History of PSVT (paroxysmal supraventricular tachycardia)   . HLD (hyperlipidemia)   . HTN (hypertension)   . Hx of CABG 2006  . Hypercholesteremia 03/21/2016  . Hypothyroidism   . LAE (left atrial enlargement) 12/2018   Severe, Noted on ECHO  . LVH (left ventricular hypertrophy) 12/2018   Mild, Noted on ECHO  . Medication management 03/21/2016  . Mitral annular calcification 12/2018   with mild MS, Noted on ECHO  . Morbid obesity ([place]) 03/21/2016  . Myocardial infarct ([place])   . OSA (obstructive sleep apnea) 03/21/2016   uses oxygen at night time  . Pain in right ankle and joints of right foot 03/21/2016  . Paronychia 03/21/2016  . Phimosis   . Pneumonia   . Primary insomnia 03/21/2016  . Prostate cancer ([place]) 2008  . Pulmonary hypertension ([place]) 12/2018   Moderate, Noted on ECHO  . Tobacco dependence 03/21/2016  . Tricuspid regurgitation 12/2018   Mild, Noted on ECHO   Past Surgical History:  Past Surgical History:  Procedure Laterality Date  . ANKLE SURGERY Right 12/2013  . CARDIAC CATHETERIZATION N/A 05/08/2016   Procedure: Left Heart Cath and Cors/Grafts Angiography;  Surgeon: Belva Crome, MD;  Location: [facility];  Service: Cardiovascular;  Laterality: N/A;  . CIRCUMCISION N/A 10/07/2019   Procedure: CIRCUMCISION ADULT;  Surgeon: Ardis Hughs, MD;  Location: [facility];  Service: Urology;  Laterality: N/A;  . CORONARY ARTERY BYPASS GRAFT  2006   x3  . ELECTROPHYSIOLOGIC STUDY N/A 08/24/2016   Procedure: SVT Ablation;  Surgeon: Will Meredith Leeds, MD;  Location: [facility];  Service: Cardiovascular;  Laterality:  N/A;  . PROSTATE SURGERY  2008  . PTCA        Social History:  reports that he quit smoking about 3 years ago. His smoking use included cigarettes. He has a 30.00 pack-year smoking history. He has never used smokeless tobacco. He reports previous alcohol use. He reports that he does not use drugs. Family History:  Family History  Problem Relation Age of Onset  . Dementia Mother   . Diabetes Sister      HOME MEDICATIONS: Allergies as of 04/27/2020      Reactions   Scallops [shellfish Allergy] Other (See Comments)   Rash; shortness of breath   Fluzone [flu Virus Vaccine] Nausea And Vomiting, Palpitations, Rash      Medication List       Accurate as of Apr 27, 2020 11:59 PM. If you have any questions, ask your nurse or doctor.        acetaminophen-codeine 300-15 MG tablet Commonly known as: TYLENOL #2 Take 1-2 tablets by mouth every 4 (four) hours as needed for moderate pain.   albuterol (2.5 MG/3ML) 0.083% nebulizer solution Commonly known as: PROVENTIL Take 3 mLs (2.5 mg total) by nebulization every 4 (four) hours as needed for wheezing or shortness of breath.   aspirin EC 81 MG tablet Take 81 mg by mouth daily.   atorvastatin 80 MG tablet Commonly known as: LIPITOR Take 80 mg by mouth daily.   betamethasone dipropionate 0.05 % lotion Apply 1 application topically 2 (two) times daily.   bisoprolol 5 MG tablet Commonly known as: ZEBETA Take 1 tablet (5 mg total) by mouth daily.   ezetimibe 10 MG tablet Commonly known as: ZETIA Take 1 tablet (10 mg total) by mouth daily.   ferrous sulfate 325 (65 FE) MG tablet Commonly known as: FeroSul Take 1 tablet (325 mg total) by mouth daily with breakfast.   formoterol 20 MCG/2ML nebulizer solution Commonly known as: Perforomist Take 2 mLs (20 mcg total) by nebulization 2 (two) times daily.   furosemide 40 MG tablet Commonly known as: LASIX Take 1.5 tablets (60 mg total) by mouth daily.   glimepiride 4 MG  tablet Commonly known as: AMARYL Take 4 mg by mouth daily with breakfast.   levothyroxine 88 MCG tablet Commonly known as: SYNTHROID Take 88 mcg by mouth daily before breakfast.   metFORMIN 500 MG 24 hr tablet Commonly known as: GLUCOPHAGE-XR Take 1,000 mg by mouth 2 (two) times daily.   OXYGEN Inhale 3 L into the lungs at bedtime. 4 lpm with 24/7 AHC   pantoprazole 40 MG tablet Commonly known as: PROTONIX Take 40 mg by mouth daily.   potassium chloride 20 MEQ packet Commonly known as: KLOR-CON Take 20 mEq by mouth daily.   revefenacin 175 MCG/3ML nebulizer solution Commonly known as: Yupelri Inhale 1 vial into the lungs daily.  Rybelsus 3 MG Tabs Generic drug: Semaglutide Take 3 mg by mouth daily. Started by: Dorita Sciara, MD   traZODone 50 MG tablet Commonly known as: DESYREL Take 50 mg by mouth at bedtime as needed for sleep.   UNABLE TO FIND Med Name: Anitra Lauth meter        ALLERGIES: Allergies  Allergen Reactions  . Scallops [Shellfish Allergy] Other (See Comments)    Rash; shortness of breath  . Fluzone [Flu Virus Vaccine] Nausea And Vomiting, Palpitations and Rash     REVIEW OF SYSTEMS: A comprehensive ROS was conducted with the patient and is negative except as per HPI    OBJECTIVE:   VITAL SIGNS: BP (!) 116/58 (BP Location: Left Arm, Patient Position: Sitting, Cuff Size: Large)   Pulse 86   Temp 98.7 F (37.1 C)   Ht 5\' 8"  (1.727 m)   Wt 250 lb (113.4 kg)   SpO2 92%   BMI 38.01 kg/m    PHYSICAL EXAM:  General: Pt appears well and is in NAD  Neck: General: Supple without adenopathy or carotid bruits. Thyroid: Thyroid size normal.  No goiter or nodules appreciated. No thyroid bruit.  Lungs: Clear with good BS bilat with no rales, rhonchi, or wheezes  Heart: RRR with normal S1 and S2 and no gallops; no murmurs; no rub  Abdomen: Normoactive bowel sounds, soft, nontender, without masses or organomegaly palpable  Extremities:  Lower  extremities - No pretibial edema. No lesions.  Skin: Normal texture and temperature to palpation.   Neuro: MS is good with appropriate affect, pt is alert and Ox3   Dm Foot Exam 04/27/2020 The skin of the feet is intact without sores or ulcerations. The pedal pulses are 2+ on right and 2+ on left. The sensation is intact to a screening 5.07, 10 gram monofilament bilaterally   DATA REVIEWED:  Lab Results  Component Value Date   HGBA1C 7.6 (H) 10/01/2019   HGBA1C 7.1 (H) 12/26/2018   Lab Results  Component Value Date   CREATININE 1.10 10/01/2019   02/25/2020 BUN/Cr 21/1.3 GFR 55 A1c 9.6% Tg 291 HDL 33 LDL 65   ASSESSMENT / PLAN / RECOMMENDATIONS:   1) Type 2 Diabetes Mellitus, Poorly controlled, With CKD III complications - Most recent A1c of 9.6 %. Goal A1c < 7.0 %.   Plan: GENERAL: I have discussed with the patient the pathophysiology of diabetes. We went over the natural progression of the disease. We talked about both insulin resistance and insulin deficiency. We stressed the importance of lifestyle changes including diet and exercise. I explained the complications associated with diabetes including retinopathy, nephropathy, neuropathy as well as increased risk of cardiovascular disease. We went over the benefit seen with glycemic control.    I explained to the patient that diabetic patients are at higher than normal risk for amputations.  Pt loves to eat fruits. We discussed the importance of low carb diet and avoiding sugar- sweetened beverages   We discussed add-on therapy with GLP-1 agonists, we discussed benefits with weight loss and improved glycemic control as well as GI side effects. He has no hx of pancreatitis   MEDICATIONS: - Start Rybelsus 3 mg , 1 tablet with Breakfast  - Continue Glimepiride 4 mg daily  - Continue Metformin 500 mg , 2 tablets twice daily    EDUCATION / INSTRUCTIONS:  BG monitoring instructions: Patient is instructed to check his blood  sugars 1 times a day, fasting .  Call [facility] if:  BG persistently < 70 or > 300. . I reviewed the Rule of 15 for the treatment of hypoglycemia in detail with the patient. Literature supplied.   2) Diabetic complications:   Eye: Does not have known diabetic retinopathy.   Neuro/ Feet: Does not have known diabetic peripheral neuropathy.  Renal: Patient does have known baseline CKD.  3) Dyslipidemia: Patient is on atorvastatin 80 mg daily as well as Zetia.. Discussed cardiovascular benefits of statins. I am hoping his Tg level will improve with low carb diet and improved glycemic control.         Signed electronically by: Mack Guise, MD  [facility]  [facility] [address], [place], [place] [address] Phone: [phone] FAX: [phone]   CC: [name], [address] [place] [address] Phone: [phone]  Fax: [phone]    Return to Endocrinology clinic as below: Future Appointments  Date Time Provider [place]  08/01/2020 10:50 AM Tionna Gigante, Melanie Crazier, MD [facility] None

## 2020-04-27 NOTE — Patient Instructions (Signed)
-   Start Rybelsus 3 mg , 1 tablet with Breakfast  - Continue Glimepiride 4 mg daily  - Continue Metformin 500 mg , 2 tablets twice daily      HOW TO TREAT LOW BLOOD SUGARS (Blood sugar LESS THAN 70 MG/DL)  Please follow the RULE OF 15 for the treatment of hypoglycemia treatment (when your (blood sugars are less than 70 mg/dL)    STEP 1: Take 15 grams of carbohydrates when your blood sugar is low, which includes:   3-4 GLUCOSE TABS  OR  3-4 OZ OF JUICE OR REGULAR SODA OR  ONE TUBE OF GLUCOSE GEL     STEP 2: RECHECK blood sugar in 15 MINUTES STEP 3: If your blood sugar is still low at the 15 minute recheck --> then, go back to STEP 1 and treat AGAIN with another 15 grams of carbohydrates.  

## 2020-04-28 DIAGNOSIS — G4733 Obstructive sleep apnea (adult) (pediatric): Secondary | ICD-10-CM | POA: Diagnosis not present

## 2020-04-28 DIAGNOSIS — I5033 Acute on chronic diastolic (congestive) heart failure: Secondary | ICD-10-CM | POA: Diagnosis not present

## 2020-04-29 ENCOUNTER — Encounter: Payer: Self-pay | Admitting: Internal Medicine

## 2020-04-29 DIAGNOSIS — E1122 Type 2 diabetes mellitus with diabetic chronic kidney disease: Secondary | ICD-10-CM | POA: Insufficient documentation

## 2020-04-29 DIAGNOSIS — N1831 Chronic kidney disease, stage 3a: Secondary | ICD-10-CM | POA: Insufficient documentation

## 2020-04-29 DIAGNOSIS — E1165 Type 2 diabetes mellitus with hyperglycemia: Secondary | ICD-10-CM | POA: Insufficient documentation

## 2020-04-29 DIAGNOSIS — E781 Pure hyperglyceridemia: Secondary | ICD-10-CM | POA: Insufficient documentation

## 2020-05-15 DIAGNOSIS — J441 Chronic obstructive pulmonary disease with (acute) exacerbation: Secondary | ICD-10-CM | POA: Diagnosis not present

## 2020-05-15 DIAGNOSIS — R05 Cough: Secondary | ICD-10-CM | POA: Diagnosis not present

## 2020-05-29 DIAGNOSIS — I5033 Acute on chronic diastolic (congestive) heart failure: Secondary | ICD-10-CM | POA: Diagnosis not present

## 2020-05-29 DIAGNOSIS — G4733 Obstructive sleep apnea (adult) (pediatric): Secondary | ICD-10-CM | POA: Diagnosis not present

## 2020-06-02 ENCOUNTER — Other Ambulatory Visit: Payer: Self-pay | Admitting: Internal Medicine

## 2020-06-28 DIAGNOSIS — I5033 Acute on chronic diastolic (congestive) heart failure: Secondary | ICD-10-CM | POA: Diagnosis not present

## 2020-06-28 DIAGNOSIS — G4733 Obstructive sleep apnea (adult) (pediatric): Secondary | ICD-10-CM | POA: Diagnosis not present

## 2020-07-14 DIAGNOSIS — I10 Essential (primary) hypertension: Secondary | ICD-10-CM | POA: Diagnosis not present

## 2020-07-14 DIAGNOSIS — I4891 Unspecified atrial fibrillation: Secondary | ICD-10-CM | POA: Diagnosis not present

## 2020-07-14 DIAGNOSIS — D509 Iron deficiency anemia, unspecified: Secondary | ICD-10-CM | POA: Diagnosis not present

## 2020-07-14 DIAGNOSIS — E039 Hypothyroidism, unspecified: Secondary | ICD-10-CM | POA: Diagnosis not present

## 2020-07-29 DIAGNOSIS — I5033 Acute on chronic diastolic (congestive) heart failure: Secondary | ICD-10-CM | POA: Diagnosis not present

## 2020-07-29 DIAGNOSIS — G4733 Obstructive sleep apnea (adult) (pediatric): Secondary | ICD-10-CM | POA: Diagnosis not present

## 2020-08-01 ENCOUNTER — Ambulatory Visit: Payer: Federal, State, Local not specified - PPO | Admitting: Internal Medicine

## 2020-08-01 NOTE — Progress Notes (Deleted)
Name: Jeremiah [name]  Age/ Sex: 74 y.o., male   MRN/ DOB: [id number], May 06, 1946     PCP: [name], MD   Reason for Endocrinology Evaluation: Type 2 Diabetes Mellitus  Initial Endocrine Consultative Visit: 04/27/2020    PATIENT IDENTIFIER: [name] is a 74 y.o. male with a past medical history of T2DM, HTN, CAD , CHF , OSA and dyslipidemia. The patient has followed with Endocrinology clinic since 04/27/2020 for consultative assistance with management of his diabetes.  DIABETIC HISTORY:  [name] was diagnosed with T2DM in 2017. He has not been on insulin in the past.  His hemoglobin A1c has ranged from 7.1 % in 2020, peaking at 9.6% in 2021     On his initial visit to our clinic his A1c was  9.6% , he was on Metformin, and  Glimepiride. He had Rybelsys on his med list but he was not taking it.  SUBJECTIVE:   During the last visit (04/27/2020): A1c 9.6%. We continued Metformin and Glimepiride. He was advised to start Rybelsus   Today (08/01/2020): [name] is here for a follow up on diabetes care.   He checks his blood sugars *** times daily, preprandial to breakfast and ***. The patient has *** had hypoglycemic episodes since the last clinic visit, which typically occur *** x / - most often occuring ***. The patient is *** symptomatic with these episodes, with symptoms of {symptoms; hypoglycemia:[id number]}.     HOME DIABETES REGIMEN:  Rybelsus 3 mg , 1 tablet with Breakfast  Glimepiride 4 mg daily  Metformin 500 mg , 2 tablets twice daily     Statin: Yes ACE-I/ARB: no   METER DOWNLOAD SUMMARY: Date range evaluated: *** Fingerstick Blood Glucose Tests = *** Average Number Tests/Day = *** Overall Mean FS Glucose = *** Standard Deviation = ***  BG Ranges: Low = *** High = ***   Hypoglycemic Events/30 Days: BG < 50 = *** Episodes of symptomatic severe hypoglycemia = ***    DIABETIC COMPLICATIONS: Microvascular complications:     CKD III  Denies:  Retinopathy, neuropathy  Last eye exam: Completed  04/2020  Macrovascular complications:    CAD (S/P CABG 2006), CHF   Denies: PVD, CVA      HISTORY:  Past Medical History:  Past Medical History:  Diagnosis Date  . A-fib ([place])   . Anemia   . Aortic valve sclerosis 12/2018   Noted on ECHO  . Arthritis   . CAD (coronary artery disease)    a. 2006: CABG in 2006 with LIMA-LAD, SVG-OM1, and SVG-RPDA  . Cardiomegaly 12/2018   Stable, noted on CXR  . Carpal tunnel syndrome    Right  . Chronic pain 03/21/2016  . Colon cancer ([place]) 2006  . COPD (chronic obstructive pulmonary disease) ([place])   . Diabetes mellitus without complication ([place])   . DVT (deep venous thrombosis) ([facility])    Right  . Essential hypertension 03/21/2016  . GERD (gastroesophageal reflux disease)   . History of blood transfusion   . History of Clostridioides difficile infection   . History of prostate cancer 03/21/2016  . History of PSVT (paroxysmal supraventricular tachycardia)   . HLD (hyperlipidemia)   . HTN (hypertension)   . Hx of CABG 2006  . Hypercholesteremia 03/21/2016  . Hypothyroidism   . LAE (left atrial enlargement) 12/2018   Severe, Noted on ECHO  . LVH (left ventricular hypertrophy) 12/2018   Mild, Noted on ECHO  . Medication management 03/21/2016  . Mitral  annular calcification 12/2018   with mild MS, Noted on ECHO  . Morbid obesity ([place]) 03/21/2016  . Myocardial infarct ([place])   . OSA (obstructive sleep apnea) 03/21/2016   uses oxygen at night time  . Pain in right ankle and joints of right foot 03/21/2016  . Paronychia 03/21/2016  . Phimosis   . Pneumonia   . Primary insomnia 03/21/2016  . Prostate cancer ([place]) 2008  . Pulmonary hypertension ([place]) 12/2018   Moderate, Noted on ECHO  . Tobacco dependence 03/21/2016  . Tricuspid regurgitation 12/2018   Mild, Noted on ECHO    Past Surgical History:  Past Surgical History:  Procedure Laterality Date  . ANKLE SURGERY Right 12/2013  .  CARDIAC CATHETERIZATION N/A 05/08/2016   Procedure: Left Heart Cath and Cors/Grafts Angiography;  Surgeon: Belva Crome, MD;  Location: [facility];  Service: Cardiovascular;  Laterality: N/A;  . CIRCUMCISION N/A 10/07/2019   Procedure: CIRCUMCISION ADULT;  Surgeon: Ardis Hughs, MD;  [facility];  Service: Urology;  Laterality: N/A;  . CORONARY ARTERY BYPASS GRAFT  2006   x3  . ELECTROPHYSIOLOGIC STUDY N/A 08/24/2016   Procedure: SVT Ablation;  Surgeon: Will Meredith Leeds, MD;  Location: [facility];  Service: Cardiovascular;  Laterality: N/A;  . PROSTATE SURGERY  2008  . PTCA       Social History:  reports that he quit smoking about 3 years ago. His smoking use included cigarettes. He has a 30.00 pack-year smoking history. He has never used smokeless tobacco. He reports previous alcohol use. He reports that he does not use drugs. Family History:  Family History  Problem Relation Age of Onset  . Dementia Mother   . Diabetes Sister       HOME MEDICATIONS: Allergies as of 08/01/2020      Reactions   Scallops [shellfish Allergy] Other (See Comments)   Rash; shortness of breath   Fluzone [flu Virus Vaccine] Nausea And Vomiting, Palpitations, Rash      Medication List       Accurate as of August 01, 2020  7:09 AM. If you have any questions, ask your nurse or doctor.        acetaminophen-codeine 300-15 MG tablet Commonly known as: TYLENOL #2 Take 1-2 tablets by mouth every 4 (four) hours as needed for moderate pain.   albuterol (2.5 MG/3ML) 0.083% nebulizer solution Commonly known as: PROVENTIL Take 3 mLs (2.5 mg total) by nebulization every 4 (four) hours as needed for wheezing or shortness of breath.   aspirin EC 81 MG tablet Take 81 mg by mouth daily.   atorvastatin 80 MG tablet Commonly known as: LIPITOR Take 80 mg by mouth daily.   betamethasone dipropionate 0.05 % lotion Apply 1 application topically 2 (two) times daily.   bisoprolol 5  MG tablet Commonly known as: ZEBETA Take 1 tablet (5 mg total) by mouth daily.   ezetimibe 10 MG tablet Commonly known as: ZETIA Take 1 tablet (10 mg total) by mouth daily.   FeroSul 325 (65 FE) MG tablet Generic drug: ferrous sulfate TAKE 1 TABLET(325 MG) BY MOUTH DAILY WITH BREAKFAST   formoterol 20 MCG/2ML nebulizer solution Commonly known as: Perforomist Take 2 mLs (20 mcg total) by nebulization 2 (two) times daily.   furosemide 40 MG tablet Commonly known as: LASIX Take 1.5 tablets (60 mg total) by mouth daily.   glimepiride 4 MG tablet Commonly known as: AMARYL Take 4 mg by mouth daily with breakfast.  levothyroxine 88 MCG tablet Commonly known as: SYNTHROID Take 88 mcg by mouth daily before breakfast.   metFORMIN 500 MG 24 hr tablet Commonly known as: GLUCOPHAGE-XR Take 1,000 mg by mouth 2 (two) times daily.   OXYGEN Inhale 3 L into the lungs at bedtime. 4 lpm with 24/7 AHC   pantoprazole 40 MG tablet Commonly known as: PROTONIX Take 40 mg by mouth daily.   potassium chloride 20 MEQ packet Commonly known as: KLOR-CON Take 20 mEq by mouth daily.   revefenacin 175 MCG/3ML nebulizer solution Commonly known as: Yupelri Inhale 1 vial into the lungs daily.   Rybelsus 3 MG Tabs Generic drug: Semaglutide Take 3 mg by mouth daily.   traZODone 50 MG tablet Commonly known as: DESYREL Take 50 mg by mouth at bedtime as needed for sleep.   UNABLE TO FIND Med Name: Anitra Lauth meter        OBJECTIVE:   Vital Signs: There were no vitals taken for this visit.  Wt Readings from Last 3 Encounters:  04/27/20 250 lb (113.4 kg)  03/09/20 257 lb (116.6 kg)  03/02/20 257 lb (116.6 kg)     Exam: General: Pt appears well and is in NAD  Lungs: Clear with good BS bilat with no rales, rhonchi, or wheezes  Heart: RRR with normal S1 and S2 and no gallops; no murmurs; no rub  Abdomen: Normoactive bowel sounds, soft, nontender, without masses or organomegaly palpable    Extremities: No pretibial edema.   Neuro: MS is good with appropriate affect, pt is alert and Ox3     Dm Foot Exam 04/27/2020 The skin of the feet is intact without sores or ulcerations. The pedal pulses are 2+ on right and 2+ on left. The sensation is intact to a screening 5.07, 10 gram monofilament bilaterally     DATA REVIEWED:  Lab Results  Component Value Date   HGBA1C 7.6 (H) 10/01/2019   HGBA1C 7.1 (H) 12/26/2018   Lab Results  Component Value Date   CREATININE 1.10 10/01/2019   02/25/2020 BUN/Cr 21/1.3 GFR 55 A1c 9.6% Tg 291 HDL 33 LDL 65   ASSESSMENT / PLAN / RECOMMENDATIONS:   1) Type 2 Diabetes Mellitus, ***controlled, With CKD III complications - Most recent A1c of *** %. Goal A1c < 7.0 %.    Plan: MEDICATIONS:  ***  EDUCATION / INSTRUCTIONS:  BG monitoring instructions: Patient is instructed to check his blood sugars *** times a day, ***.  Call [facility] if: BG persistently < 70  . I reviewed the Rule of 15 for the treatment of hypoglycemia in detail with the patient. Literature supplied.    2) Diabetic complications:   Eye: Does not have known diabetic retinopathy.   Neuro/ Feet: Does not have known diabetic peripheral neuropathy .   Renal: Patient does  have known baseline CKD. He   is *** on an ACEI/ARB at present.  3) Hypertriglyceridemia:  Patient is on atorvastatin 80 mg daily as well as Zetia.. Discussed cardiovascular benefits of statins. I am hoping his Tg level will improve with low carb diet and improved glycemic control.   F/U in ***    Signed electronically by: Mack Guise, MD  [facility]  [facility] [address] [place], [place] [address] Phone: [phone] FAX: [phone]   CC: [name], [place] [address] [place] [address] Phone: [phone]  Fax: [phone]  Return to Endocrinology clinic as below: Future  Appointments  Date Time Provider [place]  08/01/2020 10:50 AM Adele Milson, Melanie Crazier, MD [facility] None

## 2020-08-04 ENCOUNTER — Ambulatory Visit: Payer: Federal, State, Local not specified - PPO | Admitting: Internal Medicine

## 2020-08-04 ENCOUNTER — Other Ambulatory Visit: Payer: Self-pay

## 2020-08-04 ENCOUNTER — Encounter: Payer: Self-pay | Admitting: Internal Medicine

## 2020-08-04 VITALS — BP 110/60 | HR 65 | Ht 68.0 in | Wt 245.4 lb

## 2020-08-04 DIAGNOSIS — N1831 Chronic kidney disease, stage 3a: Secondary | ICD-10-CM

## 2020-08-04 DIAGNOSIS — E1121 Type 2 diabetes mellitus with diabetic nephropathy: Secondary | ICD-10-CM | POA: Diagnosis not present

## 2020-08-04 DIAGNOSIS — E1122 Type 2 diabetes mellitus with diabetic chronic kidney disease: Secondary | ICD-10-CM

## 2020-08-04 DIAGNOSIS — E1165 Type 2 diabetes mellitus with hyperglycemia: Secondary | ICD-10-CM | POA: Diagnosis not present

## 2020-08-04 LAB — POCT GLYCOSYLATED HEMOGLOBIN (HGB A1C): Hemoglobin A1C: 8.3 % — AB (ref 4.0–5.6)

## 2020-08-04 MED ORDER — RYBELSUS 3 MG PO TABS: 3.0000 mg | ORAL_TABLET | Freq: Every day | ORAL | 6 refills | Status: DC

## 2020-08-04 NOTE — Progress Notes (Signed)
Name: Jeremiah [name]  Age/ Sex: 74 y.o., male   MRN/ DOB: [id number], 02-01-1946     PCP: [name], MD   Reason for Endocrinology Evaluation: Type 2 Diabetes Mellitus  Initial Endocrine Consultative Visit: 04/27/2020    PATIENT IDENTIFIER: Jeremiah [name] is a 74 y.o. male with a past medical history of T2DM, HTN, CAD , CHF , OSA and dyslipidemia. The patient has followed with Endocrinology clinic since 04/27/2020 for consultative assistance with management of his diabetes.  DIABETIC HISTORY:  [name] was diagnosed with T2DM in 2017. He has not been on insulin in the past.  His hemoglobin A1c has ranged from 7.1 % in 2020, peaking at 9.6% in 2021     On his initial visit to our clinic his A1c was  9.6% , he was on Metformin, and  Glimepiride. He had Rybelsys on his med list but he was not taking it.  SUBJECTIVE:   During the last visit (04/27/2020): A1c 9.6%. We continued Metformin and Glimepiride. He was advised to start Rybelsus   Today (08/04/2020): Jeremiah [name] is here for a follow up on diabetes care.   He checks his blood sugars 2 times daily, preprandial. The patient has not  had hypoglycemic episodes since the last clinic visit    HOME DIABETES REGIMEN:  Rybelsus 3 mg , 1 tablet with Breakfast - not taking  Glimepiride 4 mg daily  Metformin 500 mg , 2 tablets twice daily     Statin: Yes ACE-I/ARB: no   METER DOWNLOAD SUMMARY: Brought his wife's     DIABETIC COMPLICATIONS: Microvascular complications:     CKD III  Denies: Retinopathy, neuropathy  Last eye exam: Completed  04/2020  Macrovascular complications:    CAD (S/P CABG 2006), CHF   Denies: PVD, CVA      HISTORY:  Past Medical History:  Past Medical History:  Diagnosis Date  . A-fib ([place])   . Anemia   . Aortic valve sclerosis 12/2018   Noted on ECHO  . Arthritis   . CAD (coronary artery disease)    a. 2006: CABG in 2006 with LIMA-LAD, SVG-OM1, and SVG-RPDA  . Cardiomegaly 12/2018    Stable, noted on CXR  . Carpal tunnel syndrome    Right  . Chronic pain 03/21/2016  . Colon cancer ([place]) 2006  . COPD (chronic obstructive pulmonary disease) ([place])   . Diabetes mellitus without complication ([place])   . DVT (deep venous thrombosis) ([facility])    Right  . Essential hypertension 03/21/2016  . GERD (gastroesophageal reflux disease)   . History of blood transfusion   . History of Clostridioides difficile infection   . History of prostate cancer 03/21/2016  . History of PSVT (paroxysmal supraventricular tachycardia)   . HLD (hyperlipidemia)   . HTN (hypertension)   . Hx of CABG 2006  . Hypercholesteremia 03/21/2016  . Hypothyroidism   . LAE (left atrial enlargement) 12/2018   Severe, Noted on ECHO  . LVH (left ventricular hypertrophy) 12/2018   Mild, Noted on ECHO  . Medication management 03/21/2016  . Mitral annular calcification 12/2018   with mild MS, Noted on ECHO  . Morbid obesity ([place]) 03/21/2016  . Myocardial infarct ([place])   . OSA (obstructive sleep apnea) 03/21/2016   uses oxygen at night time  . Pain in right ankle and joints of right foot 03/21/2016  . Paronychia 03/21/2016  . Phimosis   . Pneumonia   . Primary insomnia 03/21/2016  . Prostate cancer ([place]) 2008  .  Pulmonary hypertension ([place]) 12/2018   Moderate, Noted on ECHO  . Tobacco dependence 03/21/2016  . Tricuspid regurgitation 12/2018   Mild, Noted on ECHO   Past Surgical History:  Past Surgical History:  Procedure Laterality Date  . ANKLE SURGERY Right 12/2013  . CARDIAC CATHETERIZATION N/A 05/08/2016   Procedure: Left Heart Cath and Cors/Grafts Angiography;  Surgeon: Belva Crome, MD;  Location: [facility];  Service: Cardiovascular;  Laterality: N/A;  . CIRCUMCISION N/A 10/07/2019   Procedure: CIRCUMCISION ADULT;  Surgeon: Ardis Hughs, MD;  Location: [facility];  Service: Urology;  Laterality: N/A;  . CORONARY ARTERY BYPASS GRAFT  2006   x3  . ELECTROPHYSIOLOGIC STUDY N/A 08/24/2016    Procedure: SVT Ablation;  Surgeon: Will Meredith Leeds, MD;  Location: [facility];  Service: Cardiovascular;  Laterality: N/A;  . PROSTATE SURGERY  2008  . PTCA      Social History:  reports that he quit smoking about 3 years ago. His smoking use included cigarettes. He has a 30.00 pack-year smoking history. He has never used smokeless tobacco. He reports previous alcohol use. He reports that he does not use drugs. Family History:  Family History  Problem Relation Age of Onset  . Dementia Mother   . Diabetes Sister      HOME MEDICATIONS: Allergies as of 08/04/2020      Reactions   Scallops [shellfish Allergy] Other (See Comments)   Rash; shortness of breath   Fluzone [flu Virus Vaccine] Nausea And Vomiting, Palpitations, Rash      Medication List       Accurate as of August 04, 2020  9:54 AM. If you have any questions, ask your nurse or doctor.        STOP taking these medications   acetaminophen-codeine 300-15 MG tablet Commonly known as: TYLENOL #2 Stopped by: Dorita Sciara, MD   potassium chloride 20 MEQ packet Commonly known as: KLOR-CON Stopped by: Dorita Sciara, MD   revefenacin 175 MCG/3ML nebulizer solution Commonly known as: Yupelri Stopped by: Dorita Sciara, MD     TAKE these medications   albuterol (2.5 MG/3ML) 0.083% nebulizer solution Commonly known as: PROVENTIL Take 3 mLs (2.5 mg total) by nebulization every 4 (four) hours as needed for wheezing or shortness of breath.   aspirin EC 81 MG tablet Take 81 mg by mouth daily.   atorvastatin 80 MG tablet Commonly known as: LIPITOR Take 80 mg by mouth daily.   betamethasone dipropionate 0.05 % lotion Apply 1 application topically 2 (two) times daily.   bisoprolol 5 MG tablet Commonly known as: ZEBETA Take 1 tablet (5 mg total) by mouth daily.   ezetimibe 10 MG tablet Commonly known as: ZETIA Take 1 tablet (10 mg total) by mouth daily.   FeroSul 325 (65 FE) MG  tablet Generic drug: ferrous sulfate TAKE 1 TABLET(325 MG) BY MOUTH DAILY WITH BREAKFAST   formoterol 20 MCG/2ML nebulizer solution Commonly known as: Perforomist Take 2 mLs (20 mcg total) by nebulization 2 (two) times daily.   furosemide 40 MG tablet Commonly known as: LASIX Take 1.5 tablets (60 mg total) by mouth daily.   glimepiride 4 MG tablet Commonly known as: AMARYL Take 4 mg by mouth daily with breakfast.   levothyroxine 88 MCG tablet Commonly known as: SYNTHROID Take 88 mcg by mouth daily before breakfast.   metFORMIN 500 MG 24 hr tablet Commonly known as: GLUCOPHAGE-XR Take 1,000 mg by mouth 2 (two) times daily.  OXYGEN Inhale 3 L into the lungs at bedtime. 4 lpm with 24/7 AHC   pantoprazole 40 MG tablet Commonly known as: PROTONIX Take 40 mg by mouth daily.   Rybelsus 3 MG Tabs Generic drug: Semaglutide Take 3 mg by mouth daily.   traZODone 50 MG tablet Commonly known as: DESYREL Take 50 mg by mouth at bedtime as needed for sleep.   UNABLE TO FIND Med Name: [place] meter        OBJECTIVE:   Vital Signs: BP 110/60 (BP Location: Left Arm, Patient Position: Sitting, Cuff Size: Large)   Pulse 65   Ht 5\' 8"  (1.727 m)   Wt 245 lb 6.4 oz (111.3 kg)   SpO2 93%   BMI 37.31 kg/m   Wt Readings from Last 3 Encounters:  08/04/20 245 lb 6.4 oz (111.3 kg)  04/27/20 250 lb (113.4 kg)  03/09/20 257 lb (116.6 kg)     Exam: General: Pt appears well and is in NAD  Lungs: Coarse breathing sounds   Heart: RRR with normal S1 and S2 and no gallops; no murmurs; no rub  Extremities: Trace  pretibial edema.   Neuro: MS is good with appropriate affect, pt is alert and Ox3     Dm Foot Exam 04/27/2020 The skin of the feet is intact without sores or ulcerations. The pedal pulses are 2+ on right and 2+ on left. The sensation is intact to a screening 5.07, 10 gram monofilament bilaterally     DATA REVIEWED:  Lab Results  Component Value Date   HGBA1C 7.6  (H) 10/01/2019   HGBA1C 7.1 (H) 12/26/2018   Lab Results  Component Value Date   CREATININE 1.10 10/01/2019   02/25/2020 BUN/Cr 21/1.3 GFR 55 A1c 9.6% Tg 291 HDL 33 LDL 65   ASSESSMENT / PLAN / RECOMMENDATIONS:   1) Type 2 Diabetes Mellitus, Poorly- controlled, With CKD III complications - Most recent A1c of 8.3 %. Goal A1c < 7.0 %.    - Despite weight loss, his A1c remains elevated.  - He did not start the Rybelsus, stating we never send the prescription, but there's a record in his chart that the prescritpion has been sent, this was also confirmed through the pharmacy who stated they have filled it twice for him but he failed to pick it up.  - Pt encouraged to start Rybelsus - No changes today     MEDICATIONS: - Start Rybelsus 3 mg , 1 tablet with Breakfast  - Continue Glimepiride 4 mg daily  - Continue Metformin 500 mg , 2 tablets twice daily    EDUCATION / INSTRUCTIONS:  BG monitoring instructions: Patient is instructed to check his blood sugars 1 times a day, fasting .  Call [facility] if: BG persistently < 70  . I reviewed the Rule of 15 for the treatment of hypoglycemia in detail with the patient. Literature supplied.    2) Diabetic complications:   Eye: Does not have known diabetic retinopathy.   Neuro/ Feet: Does not have known diabetic peripheral neuropathy .   Renal: Patient does  have known baseline CKD. He   is not on an ACEI/ARB at present.  3) Hypertriglyceridemia:  Patient is on atorvastatin 80 mg daily as well as Zetia.. Discussed cardiovascular benefits of statins. I am hoping his Tg level will improve with low carb diet and improved glycemic control.   F/U in 4 months    Signed electronically by: Mack Guise, MD  [facility] End[facility]  [facility] [address] [place], [place] [address] Phone: [phone] FAX: [phone]   CC: [name], [address] [place] [address] Phone: [phone]  Fax: [phone]  Return to Endocrinology clinic as below: No future appointments.

## 2020-08-04 NOTE — Patient Instructions (Signed)
-   Start Rybelsus 3 mg , 1 tablet with Breakfast  - Continue Glimepiride 4 mg daily  - Continue Metformin 500 mg , 2 tablets twice daily      HOW TO TREAT LOW BLOOD SUGARS (Blood sugar LESS THAN 70 MG/DL)  Please follow the RULE OF 15 for the treatment of hypoglycemia treatment (when your (blood sugars are less than 70 mg/dL)    STEP 1: Take 15 grams of carbohydrates when your blood sugar is low, which includes:   3-4 GLUCOSE TABS  OR  3-4 OZ OF JUICE OR REGULAR SODA OR  ONE TUBE OF GLUCOSE GEL     STEP 2: RECHECK blood sugar in 15 MINUTES STEP 3: If your blood sugar is still low at the 15 minute recheck --> then, go back to STEP 1 and treat AGAIN with another 15 grams of carbohydrates.

## 2020-08-15 ENCOUNTER — Other Ambulatory Visit: Payer: Self-pay

## 2020-08-15 ENCOUNTER — Telehealth: Payer: Self-pay | Admitting: Internal Medicine

## 2020-08-15 MED ORDER — RYBELSUS 3 MG PO TABS: 3.0000 mg | ORAL_TABLET | Freq: Every day | ORAL | 0 refills | Status: DC

## 2020-08-15 NOTE — Telephone Encounter (Signed)
Patient came and in to the office and requested that his Rybelsus be sent to the [facility] instead of [facility].  Phone [phone]

## 2020-08-15 NOTE — Telephone Encounter (Signed)
Pharmacy is [facility].  Rx sent.

## 2020-08-29 DIAGNOSIS — G4733 Obstructive sleep apnea (adult) (pediatric): Secondary | ICD-10-CM | POA: Diagnosis not present

## 2020-08-29 DIAGNOSIS — I5033 Acute on chronic diastolic (congestive) heart failure: Secondary | ICD-10-CM | POA: Diagnosis not present

## 2020-09-04 ENCOUNTER — Other Ambulatory Visit: Payer: Self-pay | Admitting: Internal Medicine

## 2020-09-28 DIAGNOSIS — G4733 Obstructive sleep apnea (adult) (pediatric): Secondary | ICD-10-CM | POA: Diagnosis not present

## 2020-09-28 DIAGNOSIS — I5033 Acute on chronic diastolic (congestive) heart failure: Secondary | ICD-10-CM | POA: Diagnosis not present

## 2020-10-01 DIAGNOSIS — Z20822 Contact with and (suspected) exposure to covid-19: Secondary | ICD-10-CM | POA: Diagnosis not present

## 2020-10-12 DIAGNOSIS — J209 Acute bronchitis, unspecified: Secondary | ICD-10-CM | POA: Diagnosis not present

## 2020-10-16 ENCOUNTER — Other Ambulatory Visit: Payer: Self-pay | Admitting: Physician Assistant

## 2020-10-17 NOTE — Telephone Encounter (Signed)
Rx has been sent to the pharmacy electronically. ° °

## 2020-10-29 DIAGNOSIS — G4733 Obstructive sleep apnea (adult) (pediatric): Secondary | ICD-10-CM | POA: Diagnosis not present

## 2020-10-29 DIAGNOSIS — I5033 Acute on chronic diastolic (congestive) heart failure: Secondary | ICD-10-CM | POA: Diagnosis not present

## 2020-11-28 DIAGNOSIS — I5033 Acute on chronic diastolic (congestive) heart failure: Secondary | ICD-10-CM | POA: Diagnosis not present

## 2020-11-28 DIAGNOSIS — G4733 Obstructive sleep apnea (adult) (pediatric): Secondary | ICD-10-CM | POA: Diagnosis not present

## 2020-12-05 ENCOUNTER — Other Ambulatory Visit: Payer: Self-pay | Admitting: Internal Medicine

## 2020-12-12 ENCOUNTER — Telehealth: Payer: Self-pay | Admitting: Internal Medicine

## 2020-12-12 NOTE — Telephone Encounter (Signed)
Pt states that for the last 3 weeks he has been waking up in the middle of the night with what he describes as "anxiety attacks." He wakes suddenly from his sleep with a panicky feeling and notes that his oxygen saturation and heart rate are down. Pt wears 2.5L/min of oxygen at night at baseline, but notes that sats have been dropping down in the 60s. Titrating O2 up to 4 L/min and sitting upright bring his oxygen back to normal.  Pt denies occurrence of anxiety attacks at any other time of day. Pt states that he has a diagnosis of OSA but did not tolerate CPAP and does not currently use any type of device. Appointment made with Dr. Debara Pickett for Wednesday, December 14, 2020 at 11:45 to discuss this issue and possible next steps. Pt advised to seek evaluation and treatment if he is not able to bring his O2 sats back up with supplemental oxygen. The patient verbalizes understanding and agreement with plan.

## 2020-12-12 NOTE — Telephone Encounter (Signed)
Thanks .Marland Kitchen we'll discuss it at the appt.  Dr Lemmie Evens

## 2020-12-12 NOTE — Telephone Encounter (Signed)
STAT if HR is under 50 or over 120 (normal HR is 60-100 beats per minute)  1) What is your heart rate? 81  2) Do you have a log of your heart rate readings (document readings)? 50's when anxiety occurs at night   3) Do you have any other symptoms? No    Jeremiah [name] is calling stating for the past 3 weeks he has been having anxiety attacks at night that make his HR drop to the 50's and Oxygen level range from the 65-70's for about 30 minutes. He is wanting an appointment with Dr. Debara Pickett in regards to this. Please advise.

## 2020-12-14 ENCOUNTER — Telehealth (INDEPENDENT_AMBULATORY_CARE_PROVIDER_SITE_OTHER): Payer: Federal, State, Local not specified - PPO | Admitting: Internal Medicine

## 2020-12-14 ENCOUNTER — Encounter: Payer: Self-pay | Admitting: Internal Medicine

## 2020-12-14 VITALS — Ht 68.0 in | Wt 237.0 lb

## 2020-12-14 DIAGNOSIS — J449 Chronic obstructive pulmonary disease, unspecified: Secondary | ICD-10-CM

## 2020-12-14 DIAGNOSIS — G4733 Obstructive sleep apnea (adult) (pediatric): Secondary | ICD-10-CM

## 2020-12-14 NOTE — Progress Notes (Signed)
Virtual Visit via Telephone Note   This visit type was conducted due to national recommendations for restrictions regarding the COVID-19 Pandemic (e.g. social distancing) in an effort to limit this patient's exposure and mitigate transmission in our community.  Due to his co-morbid illnesses, this patient is at least at moderate risk for complications without adequate follow up.  This format is felt to be most appropriate for this patient at this time.  The patient did not have access to video technology/had technical difficulties with video requiring transitioning to audio format only (telephone).  All issues noted in this document were discussed and addressed.  No physical exam could be performed with this format.  Please refer to the patient's chart for his  consent to telehealth for [facility]   Date:  12/14/2020   ID:  Jeremiah [name], DOB 1946-07-15, MRN [id number] The patient was identified using 2 identifiers.  Evaluation Performed:  Follow-Up Visit  Patient Location:  [place] [address]  Provider location:   [address], [place], [place] [address]  PCP:  [name], MD  Cardiologist:  Pixie Casino, MD Electrophysiologist:  Will Meredith Leeds, MD   Chief Complaint:  Nightly panic attacks  History of Present Illness:    Jeremiah [name] is a 75 y.o. male who presents via audio/video conferencing for a telehealth visit today.  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 He is originally from [place] and his wife who is accompanying him today is originally from [place]. Mr. Arcuri had an MI in 2006 and underwent coronary artery bypass grafting with a LIMA to LAD, SVG to OM1, and SVG to RPDA. He did fairly well with this however 2015 was having chest discomfort. He underwent a nuclear stress test which showed possible reversible ischemia. Therefore he was referred for cardiac catheterization  at the [facility] [place]. That demonstrated all 3 patent bypass grafts with multivessel coronary disease. At that time he was also having episodes of palpitations and a monitor demonstrated PSVT. He was scheduled for possible ablation but is had chronic problems with an ankle fracture and nonhealing wound. He therefore never underwent ablation. He was placed on digoxin and has been on metoprolol but stopped the digoxin at some point in the past. He reports over the past 8-12 months she's had no further palpitations. He also has a history of dyslipidemia, hypothyroidism, and borderline diabetes. Recently he had a sleep study through [facility] which demonstrated severe obstructive sleep apnea and BiPAP was recommended with settings of 19/14 cm water pressure. Currently he denies any chest pain or worsening shortness of breath. His last echocardiogram from his cardiologist in [place] was a 2015 which showed an EF of 58% and no significant valvular disease.  07/26/2016  Jeremiah [name] returns today for follow-up. He was recently seen in the emergency department in the beginning of June for chest pain. This was associated with tachycardia and probable SVT. Troponin was noted to be elevated to 0.12. He had signs and symptoms concerning for unstable angina. He was evaluated by my partners and felt to need cardiac catheterization. Eventually he underwent cardiac catheterization by Dr. Tamala Julian with the results as follows:  Conclusion   1. LM lesion, 85% stenosed. 2. Ost 1st Mrg lesion, 100% stenosed. 3. SVG was injected . 4. Ost RCA to Prox RCA lesion, 100% stenosed. 5. SVG . 6. Origin to Prox Graft  lesion, 40% stenosed. 7. Mid RCA to Dist RCA lesion, 100% stenosed. 8. Prox LAD lesion, 100% stenosed. 9. LIMA .   Patent saphenous vein grafts. SVG to PDA contains eccentric 40% proximal stenosis. SVG to the circumflex is widely patent.  LIMA to the LAD is widely patent.  Native distal left  main contains 75% stenosis. The dominant obtuse marginal branch is totally occluded. The native right coronary is totally occluded.  Left ventriculogram demonstrates inferior wall hypokinesis. EF 50%.  Recent chest pain in the setting of PSVT with minimal enzyme elevation likely related to demand ischemia in the setting of underlying native coronary disease.  Recommendations:  Medical therapy.  Management of PSVT with medications versus ablation.   Since discharge she denies any further episodes of tachycardia palpitations. He is already on a high dose of beta blocker. He's had a small amount of weight gain but has stopped smoking.  He plans to start to do more exercise and work on weight loss. I'm concerned about his recurrent palpitations and the fact that asymptomatic and has demand ischemia related to them and bypass graft insufficiency.  06/10/2017  Jeremiah [name] returns today for follow-up. It's been almost a year since I last saw him. He was found to have recurrent symptomatic SVT and underwent comprehensive EP study and was diagnosed with classic AV nodal reentry tachycardia and subsequently underwent selective radiofrequency ablation by Dr. Curt Bears. He says since that time he's had no further episodes of tachypalpitations. Since then he was hospitalized twice, after developing C. difficile colitis and enteritis with GI bleed in January. Subsequently he developed acute congestive heart failure was found to have a mild reduction in LVEF to 45-50% by echo in March 2018. At that time he was on furosemide 40 mg daily. He had a follow-up with Almyra Deforest, PA-C in April who noted he was on Lasix 40 mg twice daily, however he reported that he was not taking that to me today. Fact he says he is now off of Lasix. He reports worsening shortness of breath and hasn't fact had a 13 pound weight gain since April. He denies any lower extremity edema. He has been working with Dr. Melvyn Novas for his shortness of  breath.  07/22/2017  Jeremiah [name] is back today for follow-up. He restarted his Lasix at my direction his weight is now down from 252-244. He's had significant improvement in swelling and reports some improvement in his breathing however he says it still not good. He does have a mildly reduced EF of 45-50% but also has some chronic lung disease which is likely contributing. He says he gets short of breath while doing certain activities in the yard and then comes inside and uses his oxygen which improves him almost immediately. He denies any orthopnea. He has not had any recent or productive cough. He has a follow-up visit with his pulmonologist tomorrow. I had ordered lab work including a metabolic profile and BNP which was never obtained.  09/09/2018  Jeremiah [name] is seen today in follow-up.  He reports some improvement in his shortness of breath.  He has joined a gym and now is exercising every day.  Weight however has been stable at 252, actually up 2 pounds from April.  Despite this his breathing has improved somewhat.  He still uses oxygen.  He did have one episode recently where he became nauseated and vomited.  He was up at the [place] and had been taking antibiotics for a dental infection.  He did undergo CT scan of the head and neck and that demonstrated bilateral carotid artery calcification.  This is not surprising given his history of coronary artery disease and prior CABG however he has not had prior carotid Dopplers.    02/18/2019  Jeremiah [name] is seen today in follow-up.  Recently he says has had some improvement in shortness of breath.  Dr. Melvyn Novas, his pulmonologist had started him on a new nebulizer called revefenacin.  His most recent hemoglobin A1c was 7.1.  Weight is down about 4 pounds.  He denies any new chest pain.  He has had some recent anemia and work-up is apparently underway for this.  He is tentatively scheduled for an endoscopy on April 16 with Dr. Therisa Doyne.  He is concerned  because of his pulmonary status that he may be at high risk of decompensation with the procedure.  He also has follow-up with his PCP in the near future.  09/14/2019  Jeremiah [name] returns today for follow-up.  He continues to do well.  His shortness of breath has improved which I think was both believed due to a combination of pulmonary optimization as well as improvement in LV function.  Recently his echo in fact showed LVEF has normalized to 60 to 65%.  He remains euvolemic and is actually 2 pounds lighter than he was seen about 8 months ago.  Hemoglobin A1c has been reasonably well-controlled however his lipids are still uncontrolled.  His total cholesterol is 211, triglycerides 494, HDL 32 and LDL of 114.  His target LDL is less than 70 and his elevated triglycerides also put him at increased cardiovascular risk.  03/02/2020  Jeremiah [name] is seen today in follow-up.  He denies any chest pain but does have some shortness of breath.  Is been some weight gain.  He reports less physical activity has been struggling with a long recovery after undergoing circumcision for issues with phimosis.  Its unfortunate that he had a difficult recovery but seems to be doing better.  Blood pressure was well controlled today.  His EKG demonstrated some worsening inferior and lateral T wave inversions.  This was previously seen however not as significant.  His last heart cath was in 2017 which did show a patent SVG to OM and occluded SVG to right coronary.  He is anticipating restarting an exercise program and working with a trainer  12/14/2020  Jeremiah [name] is seen today in follow-up.  He called in the office due to concerns over the past month of significant worsening symptoms at night including nocturnal hypoxia with oxygen saturations down to 60% despite being on supplemental oxygen.  This is also been associated with confusion, nausea, palpitations and anxiety with nocturnal awakenings.  He does have a history of severe  obstructive sleep apnea diagnosed in 2017 with an AHI over 100.  He was supposed to be on CPAP therapy, however after being fitted with a machine he said he only use it a half a dozen times and said he could not get used to it.  Since then he has been on oxygen.  It seems though that that has not been very helpful for him.  The patient does not have symptoms concerning for COVID-19 infection (fever, chills, cough, or new SHORTNESS OF BREATH).    Prior CV studies:   The following studies were reviewed today:  Chart reviewed  PMHx:  Past Medical History:  Diagnosis Date  . A-fib ([facility])   . Anemia   . Aortic  valve sclerosis 12/2018   Noted on ECHO  . Arthritis   . CAD (coronary artery disease)    a. 2006: CABG in 2006 with LIMA-LAD, SVG-OM1, and SVG-RPDA  . Cardiomegaly 12/2018   Stable, noted on CXR  . Carpal tunnel syndrome    Right  . Chronic pain 03/21/2016  . Colon cancer ([facility]) 2006  . COPD (chronic obstructive pulmonary disease) ([facility])   . Diabetes mellitus without complication ([place])   . DVT (deep venous thrombosis) ([facility])    Right  . Essential hypertension 03/21/2016  . GERD (gastroesophageal reflux disease)   . History of blood transfusion   . History of Clostridioides difficile infection   . History of prostate cancer 03/21/2016  . History of PSVT (paroxysmal supraventricular tachycardia)   . HLD (hyperlipidemia)   . HTN (hypertension)   . Hx of CABG 2006  . Hypercholesteremia 03/21/2016  . Hypothyroidism   . LAE (left atrial enlargement) 12/2018   Severe, Noted on ECHO  . LVH (left ventricular hypertrophy) 12/2018   Mild, Noted on ECHO  . Medication management 03/21/2016  . Mitral annular calcification 12/2018   with mild MS, Noted on ECHO  . Morbid obesity ([facility] [facility]) 03/21/2016  . Myocardial infarct ([facility] [facility])   . OSA (obstructive sleep apnea) 03/21/2016   uses oxygen at night time  . Pain in right ankle and joints of right foot 03/21/2016  . Paronychia 03/21/2016  . Phimosis    . Pneumonia   . Primary insomnia 03/21/2016  . Prostate cancer ([place]) 2008  . Pulmonary hypertension ([facility]) 12/2018   Moderate, Noted on ECHO  . Tobacco dependence 03/21/2016  . Tricuspid regurgitation 12/2018   Mild, Noted on ECHO    Past Surgical History:  Procedure Laterality Date  . ANKLE SURGERY Right 12/2013  . CARDIAC CATHETERIZATION N/A 05/08/2016   Procedure: Left Heart Cath and Cors/Grafts Angiography;  Surgeon: Belva Crome, MD;  Location: [facility];  Service: Cardiovascular;  Laterality: N/A;  . CIRCUMCISION N/A 10/07/2019   Procedure: CIRCUMCISION ADULT;  Surgeon: Ardis Hughs, MD;  Location: [facility];  Service: Urology;  Laterality: N/A;  . CORONARY ARTERY BYPASS GRAFT  2006   x3  . ELECTROPHYSIOLOGIC STUDY N/A 08/24/2016   Procedure: SVT Ablation;  Surgeon: Will Meredith Leeds, MD;  Location: [facility];  Service: Cardiovascular;  Laterality: N/A;  . PROSTATE SURGERY  2008  . PTCA      FAMHx:  Family History  Problem Relation Age of Onset  . Dementia Mother   . Diabetes Sister     SOCHx:   reports that he quit smoking about 4 years ago. His smoking use included cigarettes. He has a 30.00 pack-year smoking history. He has never used smokeless tobacco. He reports previous alcohol use. He reports that he does not use drugs.  ALLERGIES:  Allergies  Allergen Reactions  . Scallops [Shellfish Allergy] Other (See Comments)    Rash; shortness of breath  . Fluzone [Influenza Virus Vaccine] Nausea And Vomiting, Palpitations and Rash    MEDS:  Current Meds  Medication Sig  . albuterol (PROVENTIL) (2.5 MG/3ML) 0.083% nebulizer solution Take 3 mLs (2.5 mg total) by nebulization every 4 (four) hours as needed for wheezing or shortness of breath.  Marland Kitchen atorvastatin (LIPITOR) 80 MG tablet Take 80 mg by mouth daily.  . betamethasone dipropionate 0.05 % lotion Apply 1 application topically 2 (two) times daily.   . bisoprolol (ZEBETA) 5 MG tablet Take 1  tablet (5  mg total) by mouth daily.  Marland Kitchen ezetimibe (ZETIA) 10 MG tablet TAKE 1 TABLET(10 MG) BY MOUTH DAILY  . FEROSUL 325 (65 Fe) MG tablet TAKE 1 TABLET(325 MG) BY MOUTH DAILY WITH BREAKFAST  . formoterol (PERFOROMIST) 20 MCG/2ML nebulizer solution Take 2 mLs (20 mcg total) by nebulization 2 (two) times daily.  . furosemide (LASIX) 40 MG tablet TAKE 1 AND 1/2 TABLETS(60 MG) BY MOUTH DAILY  . glimepiride (AMARYL) 4 MG tablet Take 4 mg by mouth daily with breakfast.  . levothyroxine (SYNTHROID, LEVOTHROID) 88 MCG tablet Take 88 mcg by mouth daily before breakfast.  . metFORMIN (GLUCOPHAGE-XR) 500 MG 24 hr tablet Take 2,000 mg by mouth daily.  . OXYGEN Inhale 3 L into the lungs at bedtime. 4 lpm with 24/7 AHC  . pantoprazole (PROTONIX) 40 MG tablet Take 40 mg by mouth daily.  Marland Kitchen UNABLE TO FIND Med Name: Anitra Lauth meter  . [DISCONTINUED] Semaglutide (RYBELSUS) 3 MG TABS Take 3 mg by mouth daily with breakfast.  . [DISCONTINUED] traZODone (DESYREL) 50 MG tablet Take 50 mg by mouth at bedtime as needed for sleep.      ROS: Pertinent items noted in HPI and remainder of comprehensive ROS otherwise negative.  Labs/Other Tests and Data Reviewed:    Recent Labs: No results found for requested labs within last 8760 hours.   Recent Lipid Panel No results found for: CHOL, TRIG, HDL, CHOLHDL, LDLCALC, LDLDIRECT  Wt Readings from Last 3 Encounters:  12/14/20 237 lb (107.5 kg)  08/04/20 245 lb 6.4 oz (111.3 kg)  04/27/20 250 lb (113.4 kg)     Exam:    Vital Signs:  Ht 5\' 8"  (1.727 m)   Wt 237 lb (107.5 kg)   BMI 36.04 kg/m    Exam not performed due to telephone visit  ASSESSMENT & PLAN:    1. OSA-BiPAP recommended, but not on therapy 2. Worsening inferior and lateral T wave inversions, dyspnea 3. Recent SVT which was symptomatic-repeat cardiac catheterization (05/2016) shows patent grafts, status post ablation of AV nodal reentry tachycardia (08/2016) 4. CAD status post three-vessel CABG in  2006 (LIMA to LAD, SVG to OM1, SVG to RPDA) in [place] 5. Patent bypass grafts by cath in 2015 6. History of PSVT-on metoprolol 7. Dyslipidemia on Lipitor 8. COPD 9. Chronic systolic congestive heart failure-LVEF 45-50% -improved to 60 to 65% (12/2018)  10. Phimosis  Mr. Elting has had worsening nocturnal hypoxemia and awakenings with palpitations, nausea and other symptoms likely related to his hypoxia.  Unfortunately he has untreated severe sleep apnea.  His last sleep study was in 2017 and he will need a repeat study which should be a split-night study.  I advised him to bring his equipment to see if he could have that adjusted or get a new mask.  He did seem agreeable to therapy.  He may also need to bleed in oxygen or possibly need BiPAP as previously prescribed.  If he could not tolerate this, he was interested in possible referral for inspire, the implanted stimulator surgery for obstructive sleep apnea.  Follow-up with me as scheduled in March.  COVID-19 Education: The signs and symptoms of COVID-19 were discussed with the patient and how to seek care for testing (follow up with PCP or arrange E-visit).  The importance of social distancing was discussed today.  Patient Risk:   After full review of this patients clinical status, I feel that they are at least moderate risk at this time.  Time:  Today, I have spent 25 minutes with the patient with telehealth technology discussing OSA.     Medication Adjustments/Labs and Tests Ordered: Current medicines are reviewed at length with the patient today.  Concerns regarding medicines are outlined above.   Tests Ordered: No orders of the defined types were placed in this encounter.   Medication Changes: No orders of the defined types were placed in this encounter.   Disposition:  in 3 month(s)  Pixie Casino, MD, [facility], [place] Director of the Advanced Lipid Disorders &  Cardiovascular Risk  Reduction Clinic Diplomate of the American Board of Clinical Lipidology Attending Cardiologist  Direct Dial: [phone]  Fax: [phone]  Website:  [email or web address]  Pixie Casino, MD  12/14/2020 11:58 AM

## 2020-12-14 NOTE — Patient Instructions (Signed)
Medication Instructions:  No Changes In Medications at this time.  *If you need a refill on your cardiac medications before your next appointment, please call your pharmacy*  Testing/Procedures: Your physician has recommended that you have a sleep study. This test records several body functions during sleep, including: brain activity, eye movement, oxygen and carbon dioxide blood levels, heart rate and rhythm, breathing rate and rhythm, the flow of air through your mouth and nose, snoring, body muscle movements, and chest and belly movement. SOMEONE WILL REACH OUT TO YOU TO SCHEDULE THIS.   Follow-Up: At [facility], you and your health needs are our priority.  As part of our continuing mission to provide you with exceptional heart care, we have created designated Provider Care Teams.  These Care Teams include your primary Cardiologist (physician) and Advanced Practice Providers (APPs -  Physician Assistants and Nurse Practitioners) who all work together to provide you with the care you need, when you need it.  We recommend signing up for the patient portal called "MyChart".  Sign up information is provided on this After Visit Summary.  MyChart is used to connect with patients for Virtual Visits (Telemedicine).  Patients are able to view lab/test results, encounter notes, upcoming appointments, etc.  Non-urgent messages can be sent to your provider as well.   To learn more about what you can do with MyChart, go to [email or web address].    Your next appointment:   KEEP FOLLOW UP AS SCHEDULED   The format for your next appointment:   In Person  Provider:   K. Mali Hilty, MD

## 2020-12-23 ENCOUNTER — Telehealth: Payer: Self-pay | Admitting: *Deleted

## 2020-12-23 NOTE — Telephone Encounter (Signed)
Clinicals faxed to [facility] for split night PA request. Pending Auth # [id number].

## 2020-12-27 ENCOUNTER — Telehealth: Payer: Self-pay | Admitting: Internal Medicine

## 2020-12-27 DIAGNOSIS — Z79899 Other long term (current) drug therapy: Secondary | ICD-10-CM | POA: Diagnosis not present

## 2020-12-27 DIAGNOSIS — M109 Gout, unspecified: Secondary | ICD-10-CM | POA: Diagnosis not present

## 2020-12-27 DIAGNOSIS — I10 Essential (primary) hypertension: Secondary | ICD-10-CM | POA: Diagnosis not present

## 2020-12-27 DIAGNOSIS — I5022 Chronic systolic (congestive) heart failure: Secondary | ICD-10-CM | POA: Diagnosis not present

## 2020-12-27 NOTE — Telephone Encounter (Signed)
New Message:     Pt is calling to check on the status of his Sleep Study please.

## 2020-12-29 DIAGNOSIS — G4733 Obstructive sleep apnea (adult) (pediatric): Secondary | ICD-10-CM | POA: Diagnosis not present

## 2020-12-29 DIAGNOSIS — I5033 Acute on chronic diastolic (congestive) heart failure: Secondary | ICD-10-CM | POA: Diagnosis not present

## 2021-01-05 DIAGNOSIS — Z125 Encounter for screening for malignant neoplasm of prostate: Secondary | ICD-10-CM | POA: Diagnosis not present

## 2021-01-05 DIAGNOSIS — I10 Essential (primary) hypertension: Secondary | ICD-10-CM | POA: Diagnosis not present

## 2021-01-05 DIAGNOSIS — Z0000 Encounter for general adult medical examination without abnormal findings: Secondary | ICD-10-CM | POA: Diagnosis not present

## 2021-01-05 DIAGNOSIS — E7800 Pure hypercholesterolemia, unspecified: Secondary | ICD-10-CM | POA: Diagnosis not present

## 2021-01-05 DIAGNOSIS — E1169 Type 2 diabetes mellitus with other specified complication: Secondary | ICD-10-CM | POA: Diagnosis not present

## 2021-01-05 DIAGNOSIS — Z23 Encounter for immunization: Secondary | ICD-10-CM | POA: Diagnosis not present

## 2021-01-05 DIAGNOSIS — I4891 Unspecified atrial fibrillation: Secondary | ICD-10-CM | POA: Diagnosis not present

## 2021-01-05 DIAGNOSIS — I5022 Chronic systolic (congestive) heart failure: Secondary | ICD-10-CM | POA: Diagnosis not present

## 2021-01-05 DIAGNOSIS — Z79899 Other long term (current) drug therapy: Secondary | ICD-10-CM | POA: Diagnosis not present

## 2021-01-09 ENCOUNTER — Telehealth: Payer: Self-pay | Admitting: *Deleted

## 2021-01-09 DIAGNOSIS — Z0000 Encounter for general adult medical examination without abnormal findings: Secondary | ICD-10-CM | POA: Diagnosis not present

## 2021-01-09 NOTE — Telephone Encounter (Signed)
Patient notified of sleep study appointment details.

## 2021-01-10 DIAGNOSIS — D234 Other benign neoplasm of skin of scalp and neck: Secondary | ICD-10-CM | POA: Diagnosis not present

## 2021-01-10 DIAGNOSIS — L218 Other seborrheic dermatitis: Secondary | ICD-10-CM | POA: Diagnosis not present

## 2021-01-29 DIAGNOSIS — I5033 Acute on chronic diastolic (congestive) heart failure: Secondary | ICD-10-CM | POA: Diagnosis not present

## 2021-01-29 DIAGNOSIS — G4733 Obstructive sleep apnea (adult) (pediatric): Secondary | ICD-10-CM | POA: Diagnosis not present

## 2021-02-10 ENCOUNTER — Other Ambulatory Visit: Payer: Self-pay | Admitting: Internal Medicine

## 2021-02-10 ENCOUNTER — Other Ambulatory Visit: Payer: Self-pay | Admitting: Physician Assistant

## 2021-02-24 ENCOUNTER — Telehealth: Payer: Self-pay | Admitting: Internal Medicine

## 2021-02-24 ENCOUNTER — Encounter: Payer: Self-pay | Admitting: Internal Medicine

## 2021-02-24 ENCOUNTER — Ambulatory Visit: Payer: Federal, State, Local not specified - PPO | Admitting: Internal Medicine

## 2021-02-24 ENCOUNTER — Ambulatory Visit (HOSPITAL_BASED_OUTPATIENT_CLINIC_OR_DEPARTMENT_OTHER): Payer: Federal, State, Local not specified - PPO | Attending: Internal Medicine | Admitting: Cardiovascular Disease

## 2021-02-24 ENCOUNTER — Other Ambulatory Visit: Payer: Self-pay

## 2021-02-24 VITALS — BP 134/72 | HR 85 | Ht 68.0 in | Wt 244.0 lb

## 2021-02-24 DIAGNOSIS — G4733 Obstructive sleep apnea (adult) (pediatric): Secondary | ICD-10-CM

## 2021-02-24 DIAGNOSIS — E118 Type 2 diabetes mellitus with unspecified complications: Secondary | ICD-10-CM

## 2021-02-24 DIAGNOSIS — E785 Hyperlipidemia, unspecified: Secondary | ICD-10-CM | POA: Diagnosis not present

## 2021-02-24 DIAGNOSIS — I5042 Chronic combined systolic (congestive) and diastolic (congestive) heart failure: Secondary | ICD-10-CM

## 2021-02-24 MED ORDER — EMPAGLIFLOZIN 10 MG PO TABS: 10.0000 mg | ORAL_TABLET | Freq: Every day | ORAL | 11 refills | Status: DC

## 2021-02-24 NOTE — Patient Instructions (Addendum)
Medication Instructions:  START Jardiance 10mg  daily  *If you need a refill on your cardiac medications before your next appointment, please call your pharmacy*   Lab Work: BMET after 2 weeks on Jardiance (non-fasting)  BMET & A1c in 3 months (complete a few days before your next appointment)  If you have labs (blood work) drawn today and your tests are completely normal, you will receive your results only by: Marland Kitchen MyChart Message (if you have MyChart) OR . A paper copy in the mail If you have any lab test that is abnormal or we need to change your treatment, we will call you to review the results.   Testing/Procedures: NONE   Follow-Up: At [facility], you and your health needs are our priority.  As part of our continuing mission to provide you with exceptional heart care, we have created designated Provider Care Teams.  These Care Teams include your primary Cardiologist (physician) and Advanced Practice Providers (APPs -  Physician Assistants and Nurse Practitioners) who all work together to provide you with the care you need, when you need it.  We recommend signing up for the patient portal called "MyChart".  Sign up information is provided on this After Visit Summary.  MyChart is used to connect with patients for Virtual Visits (Telemedicine).  Patients are able to view lab/test results, encounter notes, upcoming appointments, etc.  Non-urgent messages can be sent to your provider as well.   To learn more about what you can do with MyChart, go to [email or web address].    Your next appointment:   3 months  The format for your next appointment:   In Person  Provider:   You may see Pixie Casino, MD or one of the following Advanced Practice Providers on your designated Care Team:    Almyra Deforest, PA-C  Fabian Sharp, PA-C or   Roby Lofts, [place]    Other Instructions

## 2021-02-24 NOTE — Progress Notes (Signed)
OFFICE NOTE  Chief Complaint:  Routine follow-up  Primary Care Physician: [name], MD  HPI:  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 He is originally from [place] and his wife who is accompanying him today is originally from [place]. Mr. Mcguiness had an MI in 2006 and underwent coronary artery bypass grafting with a LIMA to LAD, SVG to OM1, and SVG to RPDA. He did fairly well with this however 2015 was having chest discomfort. He underwent a nuclear stress test which showed possible reversible ischemia. Therefore he was referred for cardiac catheterization at [facility] [place] [facility] [place]. That demonstrated all 3 patent bypass grafts with multivessel coronary disease. At that time he was also having episodes of palpitations and a monitor demonstrated PSVT. He was scheduled for possible ablation but is had chronic problems with an ankle fracture and nonhealing wound. He therefore never underwent ablation. He was placed on digoxin and has been on metoprolol but stopped the digoxin at some point in the past. He reports over the past 8-12 months she's had no further palpitations. He also has a history of dyslipidemia, hypothyroidism, and borderline diabetes. Recently he had a sleep study through [facility] which demonstrated severe obstructive sleep apnea and BiPAP was recommended with settings of 19/14 cm water pressure. Currently he denies any chest pain or worsening shortness of breath. His last echocardiogram from his cardiologist in [place] was a 2015 which showed an EF of 58% and no significant valvular disease.  07/26/2016  Mr. Belland returns today for follow-up. He was recently seen in the emergency department in the beginning of June for chest pain. This was associated with tachycardia and probable SVT. Troponin was noted to be elevated to 0.12. He had signs and symptoms concerning for unstable angina. He was evaluated  by my partners and felt to need cardiac catheterization. Eventually he underwent cardiac catheterization by Dr. Tamala Julian with the results as follows:  Conclusion   1. LM lesion, 85% stenosed. 2. Ost 1st Mrg lesion, 100% stenosed. 3. SVG was injected . 4. Ost RCA to Prox RCA lesion, 100% stenosed. 5. SVG . 6. Origin to Prox Graft lesion, 40% stenosed. 7. Mid RCA to Dist RCA lesion, 100% stenosed. 8. Prox LAD lesion, 100% stenosed. 9. LIMA .    Patent saphenous vein grafts. SVG to PDA contains eccentric 40% proximal stenosis. SVG to the circumflex is widely patent.  LIMA to the LAD is widely patent.  Native distal left main contains 75% stenosis. The dominant obtuse marginal branch is totally occluded. The native right coronary is totally occluded.  Left ventriculogram demonstrates inferior wall hypokinesis. EF 50%.  Recent chest pain in the setting of PSVT with minimal enzyme elevation likely related to demand ischemia in the setting of underlying native coronary disease.  Recommendations:  Medical therapy.  Management of PSVT with medications versus ablation.   Since discharge she denies any further episodes of tachycardia palpitations. He is already on a high dose of beta blocker. He's had a small amount of weight gain but has stopped smoking.  He plans to start to do more exercise and work on weight loss. I'm concerned about his recurrent palpitations and the fact that asymptomatic and has demand ischemia related to them and bypass graft insufficiency.  06/10/2017  Mr. Qu returns today for follow-up. It's been almost a year since I last saw him. He was found to have recurrent symptomatic  SVT and underwent comprehensive EP study and was diagnosed with classic AV nodal reentry tachycardia and subsequently underwent selective radiofrequency ablation by Dr. Curt Bears. He says since that time he's had no further episodes of tachypalpitations. Since then he was hospitalized twice, after  developing C. difficile colitis and enteritis with GI bleed in January. Subsequently he developed acute congestive heart failure was found to have a mild reduction in LVEF to 45-50% by echo in March 2018. At that time he was on furosemide 40 mg daily. He had a follow-up with Almyra Deforest, PA-C in April who noted he was on Lasix 40 mg twice daily, however he reported that he was not taking that to me today. Fact he says he is now off of Lasix. He reports worsening shortness of breath and hasn't fact had a 13 pound weight gain since April. He denies any lower extremity edema. He has been working with Dr. Melvyn Novas for his shortness of breath.  07/22/2017  Mr. Schnorr is back today for follow-up. He restarted his Lasix at my direction his weight is now down from 252-244. He's had significant improvement in swelling and reports some improvement in his breathing however he says it still not good. He does have a mildly reduced EF of 45-50% but also has some chronic lung disease which is likely contributing. He says he gets short of breath while doing certain activities in the yard and then comes inside and uses his oxygen which improves him almost immediately. He denies any orthopnea. He has not had any recent or productive cough. He has a follow-up visit with his pulmonologist tomorrow. I had ordered lab work including a metabolic profile and BNP which was never obtained.  09/09/2018  Mr. Barich is seen today in follow-up.  He reports some improvement in his shortness of breath.  He has joined a gym and now is exercising every day.  Weight however has been stable at 252, actually up 2 pounds from April.  Despite this his breathing has improved somewhat.  He still uses oxygen.  He did have one episode recently where he became nauseated and vomited.  He was up at the [place] and had been taking antibiotics for a dental infection.  He did undergo CT scan of the head and neck and that demonstrated bilateral carotid  artery calcification.  This is not surprising given his history of coronary artery disease and prior CABG however he has not had prior carotid Dopplers.    02/18/2019  Mr. Helderman is seen today in follow-up.  Recently he says has had some improvement in shortness of breath.  Dr. Melvyn Novas, his pulmonologist had started him on a new nebulizer called revefenacin.  His most recent hemoglobin A1c was 7.1.  Weight is down about 4 pounds.  He denies any new chest pain.  He has had some recent anemia and work-up is apparently underway for this.  He is tentatively scheduled for an endoscopy on April 16 with Dr. Therisa Doyne.  He is concerned because of his pulmonary status that he may be at high risk of decompensation with the procedure.  He also has follow-up with his PCP in the near future.  09/14/2019  Mr. Deman returns today for follow-up.  He continues to do well.  His shortness of breath has improved which I think was both believed due to a combination of pulmonary optimization as well as improvement in LV function.  Recently his echo in fact showed LVEF has normalized to 60 to 65%.  He remains euvolemic and is actually 2 pounds lighter than he was seen about 8 months ago.  Hemoglobin A1c has been reasonably well-controlled however his lipids are still uncontrolled.  His total cholesterol is 211, triglycerides 494, HDL 32 and LDL of 114.  His target LDL is less than 70 and his elevated triglycerides also put him at increased cardiovascular risk.  03/02/2020  Mr. Azure is seen today in follow-up.  He denies any chest pain but does have some shortness of breath.  Is been some weight gain.  He reports less physical activity has been struggling with a long recovery after undergoing circumcision for issues with phimosis.  Its unfortunate that he had a difficult recovery but seems to be doing better.  Blood pressure was well controlled today.  His EKG demonstrated some worsening inferior and lateral T wave inversions.  This  was previously seen however not as significant.  His last heart cath was in 2017 which did show a patent SVG to OM and occluded SVG to right coronary.  He is anticipating restarting an exercise program and working with a Clinical research associate.  02/24/2021  Mr. Higbie is seen today in follow-up.  Overall he says he is feeling reasonably well.  He has had some shortness of breath.  He is scheduled for a sleep study.  We discussed this because of the previously mentioned issues and that is actually scheduled tonight.  He also has a history of heart failure with diabetes which is not at target A1c of 8.3.  Lipids recently at assess her total cholesterol 106, HDL 33, LDL 49 and triglycerides 134.  PMHx:  Past Medical History:  Diagnosis Date  . A-fib ([facility])   . Anemia   . Aortic valve sclerosis 12/2018   Noted on ECHO  . Arthritis   . CAD (coronary artery disease)    a. 2006: CABG in 2006 with LIMA-LAD, SVG-OM1, and SVG-RPDA  . Cardiomegaly 12/2018   Stable, noted on CXR  . Carpal tunnel syndrome    Right  . Chronic pain 03/21/2016  . Colon cancer ([facility]) 2006  . COPD (chronic obstructive pulmonary disease) ([facility])   . Diabetes mellitus without complication ([facility])   . DVT (deep venous thrombosis) ([facility])    Right  . Essential hypertension 03/21/2016  . GERD (gastroesophageal reflux disease)   . History of blood transfusion   . History of Clostridioides difficile infection   . History of prostate cancer 03/21/2016  . History of PSVT (paroxysmal supraventricular tachycardia)   . HLD (hyperlipidemia)   . HTN (hypertension)   . Hx of CABG 2006  . Hypercholesteremia 03/21/2016  . Hypothyroidism   . LAE (left atrial enlargement) 12/2018   Severe, Noted on ECHO  . LVH (left ventricular hypertrophy) 12/2018   Mild, Noted on ECHO  . Medication management 03/21/2016  . Mitral annular calcification 12/2018   with mild MS, Noted on ECHO  . Morbid obesity ([facility]) 03/21/2016  . Myocardial infarct ([facility])   . OSA  (obstructive sleep apnea) 03/21/2016   uses oxygen at night time  . Pain in right ankle and joints of right foot 03/21/2016  . Paronychia 03/21/2016  . Phimosis   . Pneumonia   . Primary insomnia 03/21/2016  . Prostate cancer ([facility]) 2008  . Pulmonary hypertension ([facility]) 12/2018   Moderate, Noted on ECHO  . Tobacco dependence 03/21/2016  . Tricuspid regurgitation 12/2018   Mild, Noted on ECHO    Past Surgical History:  Procedure Laterality Date  .  ANKLE SURGERY Right 12/2013  . CARDIAC CATHETERIZATION N/A 05/08/2016   Procedure: Left Heart Cath and Cors/Grafts Angiography;  Surgeon: Belva Crome, MD;  Location: [facility];  Service: Cardiovascular;  Laterality: N/A;  . CIRCUMCISION N/A 10/07/2019   Procedure: CIRCUMCISION ADULT;  Surgeon: Ardis Hughs, MD;  Location: [facility];  Service: Urology;  Laterality: N/A;  . CORONARY ARTERY BYPASS GRAFT  2006   x3  . ELECTROPHYSIOLOGIC STUDY N/A 08/24/2016   Procedure: SVT Ablation;  Surgeon: Will Meredith Leeds, MD;  Location: [facility];  Service: Cardiovascular;  Laterality: N/A;  . PROSTATE SURGERY  2008  . PTCA      FAMHx:  Family History  Problem Relation Age of Onset  . Dementia Mother   . Diabetes Sister     SOCHx:   reports that he quit smoking about 4 years ago. His smoking use included cigarettes. He has a 30.00 pack-year smoking history. He has never used smokeless tobacco. He reports previous alcohol use. He reports that he does not use drugs.  ALLERGIES:  Allergies  Allergen Reactions  . Scallops [Shellfish Allergy] Other (See Comments)    Rash; shortness of breath  . Fluzone [Influenza Virus Vaccine] Nausea And Vomiting, Palpitations and Rash    ROS: Pertinent items noted in HPI and remainder of comprehensive ROS otherwise negative.  HOME MEDS: Current Outpatient Medications  Medication Sig Dispense Refill  . albuterol (PROVENTIL) (2.5 MG/3ML) 0.083% nebulizer solution Take 3 mLs (2.5 mg total) by  nebulization every 4 (four) hours as needed for wheezing or shortness of breath. 75 mL 12  . atorvastatin (LIPITOR) 80 MG tablet Take 80 mg by mouth daily.    . bisoprolol (ZEBETA) 5 MG tablet TAKE 1 TABLET(5 MG) BY MOUTH DAILY 90 tablet 3  . colchicine 0.6 MG tablet Take 0.6 mg by mouth as needed.    . empagliflozin (JARDIANCE) 10 MG TABS tablet Take 1 tablet (10 mg total) by mouth daily before breakfast. 30 tablet 11  . ezetimibe (ZETIA) 10 MG tablet TAKE 1 TABLET(10 MG) BY MOUTH DAILY 90 tablet 2  . FEROSUL 325 (65 Fe) MG tablet TAKE 1 TABLET(325 MG) BY MOUTH DAILY WITH BREAKFAST 30 tablet 5  . formoterol (PERFOROMIST) 20 MCG/2ML nebulizer solution Take 2 mLs (20 mcg total) by nebulization 2 (two) times daily. 120 mL 11  . furosemide (LASIX) 40 MG tablet TAKE 1 AND 1/2 TABLETS(60 MG) BY MOUTH DAILY 135 tablet 1  . glimepiride (AMARYL) 4 MG tablet Take 4 mg by mouth daily with breakfast.    . levothyroxine (SYNTHROID, LEVOTHROID) 88 MCG tablet Take 88 mcg by mouth daily before breakfast.    . losartan (COZAAR) 25 MG tablet Take 25 mg by mouth daily.    . metFORMIN (GLUCOPHAGE-XR) 500 MG 24 hr tablet Take 2,000 mg by mouth daily.    . OXYGEN Inhale 3 L into the lungs at bedtime. 4 lpm with 24/7 AHC    . pantoprazole (PROTONIX) 40 MG tablet Take 40 mg by mouth daily.     No current facility-administered medications for this visit.    LABS/IMAGING: No results found for this or any previous visit (from the past 48 hour(s)). No results found.  WEIGHTS: Wt Readings from Last 3 Encounters:  02/24/21 244 lb (110.7 kg)  12/14/20 237 lb (107.5 kg)  08/04/20 245 lb 6.4 oz (111.3 kg)    VITALS: BP 134/72 (BP Location: Left Arm, Patient Position: Sitting, Cuff Size: Normal)  Pulse 85   Ht 5\' 8"  (1.727 m)   Wt 244 lb (110.7 kg)   BMI 37.10 kg/m   EXAM: General appearance: alert, no distress and moderately obese Neck: no carotid bruit and no JVD Lungs: diminished breath sounds  bilaterally and wheezes bilaterally Heart: regular rate and rhythm, S1, S2 normal, no murmur, click, rub or gallop Abdomen: soft, non-tender; bowel sounds normal; no masses,  no organomegaly Extremities: extremities normal, atraumatic, no cyanosis or edema Pulses: 2+ and symmetric Skin: Skin color, texture, turgor normal. No rashes or lesions Neurologic: Grossly normal Psych: Pleasant  EKG: Sinus rhythm with PVCs 85, inferolateral T wave changes-personally reviewed  ASSESSMENT: 1. Stable inferior and lateral T wave inversions, dyspnea 2. Recent SVT which was symptomatic-repeat cardiac catheterization (05/2016) shows patent grafts, status post ablation of AV nodal reentry tachycardia (08/2016) 3. CAD status post three-vessel CABG in 2006 (LIMA to LAD, SVG to OM1, SVG to RPDA) in [place] 4. Patent bypass grafts by cath in 2015 5. History of PSVT-on metoprolol 6. Dyslipidemia on Lipitor 7. OSA-BiPAP recommended 8. COPD 9. Chronic systolic congestive heart failure-LVEF 45-50% -improved to 60 to 65% (12/2018)  10. Phimosis  PLAN: 1.   Mr. Harbor has had some shortness of breath which is fairly stable.  His LVEF had improved in 2020.  He does have a history of heart failure and diabetes with A1c above target.  I think he benefit from adding an SGLT2 inhibitor and would recommend adding Jardiance 10 mg daily.  We will get repeat labs in a couple weeks.  Then repeat laboratory work and A1c in about 3 months.  He has a sleep study tonight and likely will need BiPAP with bleed in oxygen.  Stress testing was negative for ischemia in April 2021 and showed a small inferior scar secondary to an occluded graft to the RCA.  Follow-up with me in 3 months.  Pixie Casino, MD, [facility], [place] Director of the Advanced Lipid Disorders &  Cardiovascular Risk Reduction Clinic Diplomate of the American Board of Clinical Lipidology Attending Cardiologist  Direct Dial:  [phone]  Fax: [phone]  Website:  [email or web address]   Nadean Corwin Hilty 02/24/2021, 1:40 PM

## 2021-02-24 NOTE — Telephone Encounter (Signed)
Patient was in office today 02/24/2021 He reported he was NOT on ASA 81mg  but wanted to discuss with MD He forgot to mention so I told him I would clarify Per MD, patient should take ASA 81mg  once daily  Left message for patient that he should take this medication daily - name-verified voicemail

## 2021-02-26 DIAGNOSIS — I5033 Acute on chronic diastolic (congestive) heart failure: Secondary | ICD-10-CM | POA: Diagnosis not present

## 2021-02-26 DIAGNOSIS — G4733 Obstructive sleep apnea (adult) (pediatric): Secondary | ICD-10-CM | POA: Diagnosis not present

## 2021-02-27 ENCOUNTER — Telehealth: Payer: Self-pay

## 2021-02-27 NOTE — Telephone Encounter (Signed)
**Note De-identified Ekaterina Denise Obfuscation** -----  **Note De-Identified Trevone Prestwood Obfuscation** Message from Fidel Levy, RN sent at 02/24/2021 10:22 AM EDT ----- Regarding: jardiance 10mg  - PA? Hi Lynn -- Dr. Debara Pickett started this patient on Jardiance 10mg  QD -- diabetes, CHF  I would anticipate a PA is needed  Thanks

## 2021-02-27 NOTE — Telephone Encounter (Signed)
**Note De-Identified Brenley Priore Obfuscation** Jardiance PA done through covermymeds. Key: Roanna Epley

## 2021-03-12 ENCOUNTER — Encounter (HOSPITAL_BASED_OUTPATIENT_CLINIC_OR_DEPARTMENT_OTHER): Payer: Self-pay | Admitting: Cardiovascular Disease

## 2021-03-12 NOTE — Procedures (Signed)
Patient Name: Jeremiah [name], Jeremiah [name] Date: 02/24/2021 Gender: Male D.O.B: 04-02-1946 Age (years): 73 Referring Provider: Nadean Corwin Hilty Height (inches): 68 Interpreting Physician: Shelva Majestic MD, ABSM Weight (lbs): 228 RPSGT: Baxter Flattery BMI: 35 MRN: [id number] Neck Size: 20.50  CLINICAL INFORMATION Sleep Study Type: Split Night CPAP  Indication for sleep study: Obesity, OSA, Snoring, Witnessed Apneas  Epworth Sleepiness Score: 10  SLEEP STUDY TECHNIQUE As per the AASM Manual for the Scoring of Sleep and Associated Events v2.3 (April 2016) with a hypopnea requiring 4% desaturations.  The channels recorded and monitored were frontal, central and occipital EEG, electrooculogram (EOG), submentalis EMG (chin), nasal and oral airflow, thoracic and abdominal wall motion, anterior tibialis EMG, snore microphone, electrocardiogram, and pulse oximetry. Continuous positive airway pressure (CPAP) was initiated when the patient met split night criteria and was titrated according to treat sleep-disordered breathing.  MEDICATIONS albuterol (PROVENTIL) (2.5 MG/3ML) 0.083% nebulizer solution atorvastatin (LIPITOR) 80 MG tablet bisoprolol (ZEBETA) 5 MG tablet colchicine 0.6 MG tablet empagliflozin (JARDIANCE) 10 MG TABS tablet ezetimibe (ZETIA) 10 MG tablet FEROSUL 325 (65 Fe) MG tablet formoterol (PERFOROMIST) 20 MCG/2ML nebulizer solution furosemide (LASIX) 40 MG tablet glimepiride (AMARYL) 4 MG tablet levothyroxine (SYNTHROID, LEVOTHROID) 88 MCG tablet losartan (COZAAR) 25 MG tablet metFORMIN (GLUCOPHAGE-XR) 500 MG 24 hr tablet OXYGEN pantoprazole (PROTONIX) 40 MG tablet Medications self-administered by patient taken the night of the study : N/A  RESPIRATORY PARAMETERS Diagnostic Total AHI (/hr): 26.5 RDI (/hr): 31.4 OA Index (/hr): 25.2 CA Index (/hr): 0.4 REM AHI (/hr): 0.0 NREM AHI (/hr): 29.1 Supine AHI (/hr): 73.6 Non-supine AHI (/hr): 13.5 Min O2 Sat (%): 85.0 Mean  O2 (%): 93.6 Time below 88% (min): 0.5   Titration Optimal Pressure (cm): 14 AHI at Optimal Pressure (/hr): 0 Min O2 at Optimal Pressure (%): 92.0 Supine % at Optimal (%): 100 Sleep % at Optimal (%): 100   SLEEP ARCHITECTURE The recording time for the entire night was 378.3 minutes.  During a baseline period of 155.6 minutes, the patient slept for 135.9 minutes in REM and nonREM, yielding a sleep efficiency of 87.3%%. Sleep onset after lights out was 0.8 minutes with a REM latency of 74.0 minutes. The patient spent 6.3%% of the night in stage N1 sleep, 84.9%% in stage N2 sleep, 0.0%% in stage N3 and 8.8% in REM.  During the titration period of 195.2 minutes, the patient slept for 143.5 minutes in REM and nonREM, yielding a sleep efficiency of 73.5%%. Sleep onset after CPAP initiation was 46.2 minutes with a REM latency of 96.5 minutes. The patient spent 2.4%% of the night in stage N1 sleep, 64.5%% in stage N2 sleep, 0.0%% in stage N3 and 33.1% in REM.  CARDIAC DATA The 2 lead EKG demonstrated sinus rhythm. The mean heart rate was 100.0 beats per minute. Other EKG findings include: None.  LEG MOVEMENT DATA The total Periodic Limb Movements of Sleep (PLMS) were 0. The PLMS index was 0.0 .  IMPRESSIONS - Moderate obstructive sleep apnea occurred during the diagnostic portion of the study (AHI 26.5/h; RDI 31.4/h). Events were very severe with supine sleep (AHI 73.6/h).  CPAP was initiated at 5 and was titrated optimal PAP pressure at 14 cm of water. AHI at 14 cm was 0, O2 nadir 92%.  - No significant central sleep apnea occurred during the diagnostic portion of the study (CAI = 0.4/hour). - The patient was on 3 liters of O2 for the study. Mild oxygen desaturation to a nadir of  85% was noted during the diagnostic portion of the study. - No snoring was audible during the diagnostic portion of the study. - No cardiac abnormalities were noted during this study. - Clinically significant periodic limb  movements did not occur during sleep.  DIAGNOSIS - Obstructive Sleep Apnea (G47.33)  RECOMMENDATIONS - The patient was on 3 liters of supplemental oxygen for the study. Recommend an initial trial of CPAP therapy with EPR at 14 cm H2O with heated humidification.  A Medium size Fisher&Paykel Full Face Mask Simplus mask was used for the titration. - Effort should be made to optimize nasal and oropharyngeal patency.  - Avoid alcohol, sedatives and other CNS depressants that may worsen sleep apnea and disrupt normal sleep architecture. - Sleep hygiene should be reviewed to assess factors that may improve sleep quality. - Weight management and regular exercise should be initiated or continued. - Recommend a download in 30 days and sleep clinic after evaluation after 4 weeks of therapy.  [Electronically signed] 03/12/2021 04:40 PM  Shelva Majestic MD, [facility], [place], American Board of Sleep Medicine   NPI: [id number]  [place] PH: [phone]   FX: [phone] [place]

## 2021-03-13 ENCOUNTER — Telehealth: Payer: Self-pay | Admitting: Internal Medicine

## 2021-03-13 ENCOUNTER — Telehealth: Payer: Self-pay | Admitting: *Deleted

## 2021-03-13 NOTE — Telephone Encounter (Signed)
Patient notified of sleep study results. He informed me that he has a machine that is about 74 years old that he purchased himself, however he just received a "recall letter" from the company. He will call me once he gets home to confirm the name on it. I told him that if he received a recall letter than it is more than likely a [employer] device. I will await his call before proceeding.

## 2021-03-13 NOTE — Telephone Encounter (Signed)
Patient requesting his sleep study results.

## 2021-03-16 ENCOUNTER — Telehealth: Payer: Self-pay | Admitting: *Deleted

## 2021-03-16 NOTE — Telephone Encounter (Signed)
Patient called me and noted that he "threw out" the machine. I told him that I will confirm O2 orders with Dr Claiborne Billings and order him a new machine.

## 2021-03-21 ENCOUNTER — Telehealth: Payer: Self-pay | Admitting: *Deleted

## 2021-03-21 DIAGNOSIS — E118 Type 2 diabetes mellitus with unspecified complications: Secondary | ICD-10-CM | POA: Diagnosis not present

## 2021-03-21 DIAGNOSIS — G4733 Obstructive sleep apnea (adult) (pediatric): Secondary | ICD-10-CM | POA: Diagnosis not present

## 2021-03-21 DIAGNOSIS — E785 Hyperlipidemia, unspecified: Secondary | ICD-10-CM | POA: Diagnosis not present

## 2021-03-21 DIAGNOSIS — I5032 Chronic diastolic (congestive) heart failure: Secondary | ICD-10-CM | POA: Diagnosis not present

## 2021-03-21 NOTE — Telephone Encounter (Signed)
Order for CPAP faxed to [facility].

## 2021-03-22 LAB — HEMOGLOBIN A1C
Est. average glucose Bld gHb Est-mCnc: 209 mg/dL
Hgb A1c MFr Bld: 8.9 % — ABNORMAL HIGH (ref 4.8–5.6)

## 2021-03-22 LAB — BASIC METABOLIC PANEL
BUN/Creatinine Ratio: 20 (ref 10–24)
BUN: 25 mg/dL (ref 8–27)
CO2: 25 mmol/L (ref 20–29)
Calcium: 9.7 mg/dL (ref 8.6–10.2)
Chloride: 96 mmol/L (ref 96–106)
Creatinine, Ser: 1.22 mg/dL (ref 0.76–1.27)
Glucose: 168 mg/dL — ABNORMAL HIGH (ref 65–99)
Potassium: 4.1 mmol/L (ref 3.5–5.2)
Sodium: 142 mmol/L (ref 134–144)
eGFR: 62 mL/min/{1.73_m2} (ref >59)

## 2021-03-29 DIAGNOSIS — I5033 Acute on chronic diastolic (congestive) heart failure: Secondary | ICD-10-CM | POA: Diagnosis not present

## 2021-03-29 DIAGNOSIS — G4733 Obstructive sleep apnea (adult) (pediatric): Secondary | ICD-10-CM | POA: Diagnosis not present

## 2021-04-11 ENCOUNTER — Encounter: Payer: Self-pay | Admitting: Internal Medicine

## 2021-04-11 ENCOUNTER — Other Ambulatory Visit: Payer: Self-pay | Admitting: *Deleted

## 2021-04-11 DIAGNOSIS — E118 Type 2 diabetes mellitus with unspecified complications: Secondary | ICD-10-CM

## 2021-04-11 DIAGNOSIS — M7711 Lateral epicondylitis, right elbow: Secondary | ICD-10-CM | POA: Diagnosis not present

## 2021-04-11 DIAGNOSIS — E785 Hyperlipidemia, unspecified: Secondary | ICD-10-CM

## 2021-04-11 MED ORDER — EMPAGLIFLOZIN 25 MG PO TABS: 25.0000 mg | ORAL_TABLET | Freq: Every day | ORAL | 5 refills | Status: DC

## 2021-04-21 DIAGNOSIS — M25521 Pain in right elbow: Secondary | ICD-10-CM | POA: Diagnosis not present

## 2021-04-25 DIAGNOSIS — M25521 Pain in right elbow: Secondary | ICD-10-CM | POA: Diagnosis not present

## 2021-04-27 DIAGNOSIS — L720 Epidermal cyst: Secondary | ICD-10-CM | POA: Diagnosis not present

## 2021-04-27 DIAGNOSIS — L570 Actinic keratosis: Secondary | ICD-10-CM | POA: Diagnosis not present

## 2021-04-27 DIAGNOSIS — L218 Other seborrheic dermatitis: Secondary | ICD-10-CM | POA: Diagnosis not present

## 2021-04-27 DIAGNOSIS — B078 Other viral warts: Secondary | ICD-10-CM | POA: Diagnosis not present

## 2021-04-28 DIAGNOSIS — G4733 Obstructive sleep apnea (adult) (pediatric): Secondary | ICD-10-CM | POA: Diagnosis not present

## 2021-04-28 DIAGNOSIS — I5033 Acute on chronic diastolic (congestive) heart failure: Secondary | ICD-10-CM | POA: Diagnosis not present

## 2021-04-28 DIAGNOSIS — M25521 Pain in right elbow: Secondary | ICD-10-CM | POA: Diagnosis not present

## 2021-05-02 DIAGNOSIS — M25521 Pain in right elbow: Secondary | ICD-10-CM | POA: Diagnosis not present

## 2021-05-03 DIAGNOSIS — R195 Other fecal abnormalities: Secondary | ICD-10-CM | POA: Diagnosis not present

## 2021-05-05 DIAGNOSIS — M25521 Pain in right elbow: Secondary | ICD-10-CM | POA: Diagnosis not present

## 2021-05-08 DIAGNOSIS — M25521 Pain in right elbow: Secondary | ICD-10-CM | POA: Diagnosis not present

## 2021-05-11 DIAGNOSIS — M25521 Pain in right elbow: Secondary | ICD-10-CM | POA: Diagnosis not present

## 2021-05-12 DIAGNOSIS — H52223 Regular astigmatism, bilateral: Secondary | ICD-10-CM | POA: Diagnosis not present

## 2021-05-12 DIAGNOSIS — E119 Type 2 diabetes mellitus without complications: Secondary | ICD-10-CM | POA: Diagnosis not present

## 2021-05-12 DIAGNOSIS — H524 Presbyopia: Secondary | ICD-10-CM | POA: Diagnosis not present

## 2021-05-12 DIAGNOSIS — H5213 Myopia, bilateral: Secondary | ICD-10-CM | POA: Diagnosis not present

## 2021-05-12 DIAGNOSIS — H2513 Age-related nuclear cataract, bilateral: Secondary | ICD-10-CM | POA: Diagnosis not present

## 2021-05-16 DIAGNOSIS — M25521 Pain in right elbow: Secondary | ICD-10-CM | POA: Diagnosis not present

## 2021-05-19 DIAGNOSIS — M25521 Pain in right elbow: Secondary | ICD-10-CM | POA: Diagnosis not present

## 2021-05-23 DIAGNOSIS — M25521 Pain in right elbow: Secondary | ICD-10-CM | POA: Diagnosis not present

## 2021-05-26 DIAGNOSIS — M25521 Pain in right elbow: Secondary | ICD-10-CM | POA: Diagnosis not present

## 2021-05-29 DIAGNOSIS — G4733 Obstructive sleep apnea (adult) (pediatric): Secondary | ICD-10-CM | POA: Diagnosis not present

## 2021-05-29 DIAGNOSIS — I5033 Acute on chronic diastolic (congestive) heart failure: Secondary | ICD-10-CM | POA: Diagnosis not present

## 2021-05-30 DIAGNOSIS — M25521 Pain in right elbow: Secondary | ICD-10-CM | POA: Diagnosis not present

## 2021-06-02 ENCOUNTER — Ambulatory Visit: Payer: Federal, State, Local not specified - PPO | Admitting: Internal Medicine

## 2021-06-09 DIAGNOSIS — M25521 Pain in right elbow: Secondary | ICD-10-CM | POA: Diagnosis not present

## 2021-06-12 DIAGNOSIS — M25521 Pain in right elbow: Secondary | ICD-10-CM | POA: Diagnosis not present

## 2021-06-13 DIAGNOSIS — M79675 Pain in left toe(s): Secondary | ICD-10-CM | POA: Diagnosis not present

## 2021-06-16 DIAGNOSIS — M25521 Pain in right elbow: Secondary | ICD-10-CM | POA: Diagnosis not present

## 2021-06-21 DIAGNOSIS — M25521 Pain in right elbow: Secondary | ICD-10-CM | POA: Diagnosis not present

## 2021-06-23 DIAGNOSIS — M25521 Pain in right elbow: Secondary | ICD-10-CM | POA: Diagnosis not present

## 2021-06-27 DIAGNOSIS — M25521 Pain in right elbow: Secondary | ICD-10-CM | POA: Diagnosis not present

## 2021-06-28 DIAGNOSIS — G4733 Obstructive sleep apnea (adult) (pediatric): Secondary | ICD-10-CM | POA: Diagnosis not present

## 2021-06-28 DIAGNOSIS — I5033 Acute on chronic diastolic (congestive) heart failure: Secondary | ICD-10-CM | POA: Diagnosis not present

## 2021-06-30 DIAGNOSIS — M25521 Pain in right elbow: Secondary | ICD-10-CM | POA: Diagnosis not present

## 2021-07-03 ENCOUNTER — Telehealth: Payer: Self-pay | Admitting: *Deleted

## 2021-07-03 NOTE — Telephone Encounter (Signed)
Patient called in to say that he still has not received his CPAP machine. I told him that the machines are still on national backorder. I will call [facility] to have them to reach out to him to inform him where they are now with filling the orders. Patient thanked me and call ended.

## 2021-07-04 DIAGNOSIS — M25521 Pain in right elbow: Secondary | ICD-10-CM | POA: Diagnosis not present

## 2021-07-07 DIAGNOSIS — M25521 Pain in right elbow: Secondary | ICD-10-CM | POA: Diagnosis not present

## 2021-07-10 DIAGNOSIS — I5022 Chronic systolic (congestive) heart failure: Secondary | ICD-10-CM | POA: Diagnosis not present

## 2021-07-10 DIAGNOSIS — E039 Hypothyroidism, unspecified: Secondary | ICD-10-CM | POA: Diagnosis not present

## 2021-07-10 DIAGNOSIS — I4891 Unspecified atrial fibrillation: Secondary | ICD-10-CM | POA: Diagnosis not present

## 2021-07-10 DIAGNOSIS — E1169 Type 2 diabetes mellitus with other specified complication: Secondary | ICD-10-CM | POA: Diagnosis not present

## 2021-07-10 DIAGNOSIS — Z79899 Other long term (current) drug therapy: Secondary | ICD-10-CM | POA: Diagnosis not present

## 2021-07-10 DIAGNOSIS — I10 Essential (primary) hypertension: Secondary | ICD-10-CM | POA: Diagnosis not present

## 2021-07-11 DIAGNOSIS — M25521 Pain in right elbow: Secondary | ICD-10-CM | POA: Diagnosis not present

## 2021-07-18 ENCOUNTER — Ambulatory Visit: Payer: Federal, State, Local not specified - PPO | Admitting: Internal Medicine

## 2021-07-18 ENCOUNTER — Other Ambulatory Visit: Payer: Self-pay

## 2021-07-18 ENCOUNTER — Encounter: Payer: Self-pay | Admitting: Internal Medicine

## 2021-07-18 VITALS — BP 130/90 | HR 65 | Ht 68.0 in | Wt 238.6 lb

## 2021-07-18 DIAGNOSIS — E118 Type 2 diabetes mellitus with unspecified complications: Secondary | ICD-10-CM

## 2021-07-18 DIAGNOSIS — G4733 Obstructive sleep apnea (adult) (pediatric): Secondary | ICD-10-CM

## 2021-07-18 DIAGNOSIS — I10 Essential (primary) hypertension: Secondary | ICD-10-CM | POA: Diagnosis not present

## 2021-07-18 DIAGNOSIS — I25810 Atherosclerosis of coronary artery bypass graft(s) without angina pectoris: Secondary | ICD-10-CM | POA: Diagnosis not present

## 2021-07-18 DIAGNOSIS — J449 Chronic obstructive pulmonary disease, unspecified: Secondary | ICD-10-CM

## 2021-07-18 DIAGNOSIS — I5032 Chronic diastolic (congestive) heart failure: Secondary | ICD-10-CM | POA: Diagnosis not present

## 2021-07-18 MED ORDER — FUROSEMIDE 40 MG PO TABS: 40.0000 mg | ORAL_TABLET | Freq: Every day | ORAL | 1 refills | Status: DC

## 2021-07-18 NOTE — Progress Notes (Signed)
OFFICE NOTE  Chief Complaint:  Routine follow-up  Primary Care Physician: [name], MD  HPI:  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 He is originally from [place] and his wife who is accompanying him today is originally from [place]. Mr. Hearing had an MI in 2006 and underwent coronary artery bypass grafting with a LIMA to LAD, SVG to OM1, and SVG to RPDA. He did fairly well with this however 2015 was having chest discomfort. He underwent a nuclear stress test which showed possible reversible ischemia. Therefore he was referred for cardiac catheterization at [facility] [place] [facility] [place]. That demonstrated all 3 patent bypass grafts with multivessel coronary disease. At that time he was also having episodes of palpitations and a monitor demonstrated PSVT. He was scheduled for possible ablation but is had chronic problems with an ankle fracture and nonhealing wound. He therefore never underwent ablation. He was placed on digoxin and has been on metoprolol but stopped the digoxin at some point in the past. He reports over the past 8-12 months she's had no further palpitations. He also has a history of dyslipidemia, hypothyroidism, and borderline diabetes. Recently he had a sleep study through [facility] which demonstrated severe obstructive sleep apnea and BiPAP was recommended with settings of 19/14 cm water pressure. Currently he denies any chest pain or worsening shortness of breath. His last echocardiogram from his cardiologist in [place] was a 2015 which showed an EF of 58% and no significant valvular disease.  07/26/2016  Jeremiah [name] returns today for follow-up. He was recently seen in the emergency department in the beginning of June for chest pain. This was associated with tachycardia and probable SVT. Troponin was noted to be elevated to 0.12. He had signs and symptoms concerning for unstable angina. He was evaluated  by my partners and felt to need cardiac catheterization. Eventually he underwent cardiac catheterization by Dr. Tamala Julian with the results as follows:  Conclusion   LM lesion, 85% stenosed. Ost 1st Mrg lesion, 100% stenosed. SVG was injected . Ost RCA to Prox RCA lesion, 100% stenosed. SVG . Origin to Prox Graft lesion, 40% stenosed. Mid RCA to Dist RCA lesion, 100% stenosed. Prox LAD lesion, 100% stenosed. LIMA .   Patent saphenous vein grafts. SVG to PDA contains eccentric 40% proximal stenosis. SVG to the circumflex is widely patent. LIMA to the LAD is widely patent. Native distal left main contains 75% stenosis. The dominant obtuse marginal branch is totally occluded. The native right coronary is totally occluded. Left ventriculogram demonstrates inferior wall hypokinesis. EF 50%. Recent chest pain in the setting of PSVT with minimal enzyme elevation likely related to demand ischemia in the setting of underlying native coronary disease.   Recommendations: Medical therapy. Management of PSVT with medications versus ablation.   Since discharge she denies any further episodes of tachycardia palpitations. He is already on a high dose of beta blocker. He's had a small amount of weight gain but has stopped smoking.  He plans to start to do more exercise and work on weight loss. I'm concerned about his recurrent palpitations and the fact that asymptomatic and has demand ischemia related to them and bypass graft insufficiency.  06/10/2017  Jeremiah [name] returns today for follow-up. It's been almost a year since I last saw him. He was found to have recurrent symptomatic SVT and underwent comprehensive EP study and was diagnosed with classic AV nodal reentry tachycardia  and subsequently underwent selective radiofrequency ablation by Dr. Curt Bears. He says since that time he's had no further episodes of tachypalpitations. Since then he was hospitalized twice, after developing C. difficile colitis and  enteritis with GI bleed in January. Subsequently he developed acute congestive heart failure was found to have a mild reduction in LVEF to 45-50% by echo in March 2018. At that time he was on furosemide 40 mg daily. He had a follow-up with Almyra Deforest, PA-C in April who noted he was on Lasix 40 mg twice daily, however he reported that he was not taking that to me today. Fact he says he is now off of Lasix. He reports worsening shortness of breath and hasn't fact had a 13 pound weight gain since April. He denies any lower extremity edema. He has been working with Dr. Melvyn Novas for his shortness of breath.  07/22/2017  Jeremiah [name] is back today for follow-up. He restarted his Lasix at my direction his weight is now down from 252-244. He's had significant improvement in swelling and reports some improvement in his breathing however he says it still not good. He does have a mildly reduced EF of 45-50% but also has some chronic lung disease which is likely contributing. He says he gets short of breath while doing certain activities in the yard and then comes inside and uses his oxygen which improves him almost immediately. He denies any orthopnea. He has not had any recent or productive cough. He has a follow-up visit with his pulmonologist tomorrow. I had ordered lab work including a metabolic profile and BNP which was never obtained.  09/09/2018  Jeremiah [name] is seen today in follow-up.  He reports some improvement in his shortness of breath.  He has joined a gym and now is exercising every day.  Weight however has been stable at 252, actually up 2 pounds from April.  Despite this his breathing has improved somewhat.  He still uses oxygen.  He did have one episode recently where he became nauseated and vomited.  He was up at the [facility] and had been taking antibiotics for a dental infection.  He did undergo CT scan of the head and neck and that demonstrated bilateral carotid artery calcification.  This is not  surprising given his history of coronary artery disease and prior CABG however he has not had prior carotid Dopplers.    02/18/2019  Jeremiah [name] is seen today in follow-up.  Recently he says has had some improvement in shortness of breath.  Dr. Melvyn Novas, his pulmonologist had started him on a new nebulizer called revefenacin.  His most recent hemoglobin A1c was 7.1.  Weight is down about 4 pounds.  He denies any new chest pain.  He has had some recent anemia and work-up is apparently underway for this.  He is tentatively scheduled for an endoscopy on April 16 with Dr. Therisa Doyne.  He is concerned because of his pulmonary status that he may be at high risk of decompensation with the procedure.  He also has follow-up with his PCP in the near future.  09/14/2019  Jeremiah [name] returns today for follow-up.  He continues to do well.  His shortness of breath has improved which I think was both believed due to a combination of pulmonary optimization as well as improvement in LV function.  Recently his echo in fact showed LVEF has normalized to 60 to 65%.  He remains euvolemic and is actually 2 pounds lighter than he was seen about  8 months ago.  Hemoglobin A1c has been reasonably well-controlled however his lipids are still uncontrolled.  His total cholesterol is 211, triglycerides 494, HDL 32 and LDL of 114.  His target LDL is less than 70 and his elevated triglycerides also put him at increased cardiovascular risk.  03/02/2020  Jeremiah [name] is seen today in follow-up.  He denies any chest pain but does have some shortness of breath.  Is been some weight gain.  He reports less physical activity has been struggling with a long recovery after undergoing circumcision for issues with phimosis.  Its unfortunate that he had a difficult recovery but seems to be doing better.  Blood pressure was well controlled today.  His EKG demonstrated some worsening inferior and lateral T wave inversions.  This was previously seen however not as  significant.  His last heart cath was in 2017 which did show a patent SVG to OM and occluded SVG to right coronary.  He is anticipating restarting an exercise program and working with a Clinical research associate.  02/24/2021  Jeremiah [name] is seen today in follow-up.  Overall he says he is feeling reasonably well.  He has had some shortness of breath.  He is scheduled for a sleep study.  We discussed this because of the previously mentioned issues and that is actually scheduled tonight.  He also has a history of heart failure with diabetes which is not at target A1c of 8.3.  Lipids recently at assess her total cholesterol 106, HDL 33, LDL 49 and triglycerides 134.  07/18/2021  Jeremiah [name] is seen today for routine follow-up.  He seems to be doing well on the Jardiance at 25 mg daily.  Hemoglobin A1c is now come down to 7.3.  Blood pressure is well controlled.  EKG is unchanged showing sinus rhythm with some lateral T wave changes.  He has noted some lower blood pressures at home.  He says around the mid 90s over 48s.  Blood pressure here was elevated 130/90 however more typical readings are between 90 and [id number] systolic.  He says he is asymptomatic for this.  He is on very little medicine.  1 option would be to consider backing off on the bisoprolol.  Cholesterol is very well controlled as of February total 106, HDL 33, LDL 49 triglycerides 134.  I do not believe it is "too low".  Would recommend continue his current regimen.  PMHx:  Past Medical History:  Diagnosis Date   A-fib ([place])    Anemia    Aortic valve sclerosis 12/2018   Noted on ECHO   Arthritis    CAD (coronary artery disease)    a. 2006: CABG in 2006 with LIMA-LAD, SVG-OM1, and SVG-RPDA   Cardiomegaly 12/2018   Stable, noted on CXR   Carpal tunnel syndrome    Right   Chronic pain 03/21/2016   Colon cancer ([facility]) 2006   COPD (chronic obstructive pulmonary disease) ([place])    Diabetes mellitus without complication ([place])    DVT (deep venous thrombosis) ([place])     Right   Essential hypertension 03/21/2016   GERD (gastroesophageal reflux disease)    History of blood transfusion    History of Clostridioides difficile infection    History of prostate cancer 03/21/2016   History of PSVT (paroxysmal supraventricular tachycardia)    HLD (hyperlipidemia)    HTN (hypertension)    Hx of CABG 2006   Hypercholesteremia 03/21/2016   Hypothyroidism    LAE (left atrial enlargement) 12/2018   Severe,  Noted on ECHO   LVH (left ventricular hypertrophy) 12/2018   Mild, Noted on ECHO   Medication management 03/21/2016   Mitral annular calcification 12/2018   with mild MS, Noted on ECHO   Morbid obesity ([place]) 03/21/2016   Myocardial infarct ([facility])    OSA (obstructive sleep apnea) 03/21/2016   uses oxygen at night time   Pain in right ankle and joints of right foot 03/21/2016   Paronychia 03/21/2016   Phimosis    Pneumonia    Primary insomnia 03/21/2016   Prostate cancer ([facility]) 2008   Pulmonary hypertension ([place]) 12/2018   Moderate, Noted on ECHO   Tobacco dependence 03/21/2016   Tricuspid regurgitation 12/2018   Mild, Noted on ECHO    Past Surgical History:  Procedure Laterality Date   ANKLE SURGERY Right 12/2013   CARDIAC CATHETERIZATION N/A 05/08/2016   Procedure: Left Heart Cath and Cors/Grafts Angiography;  Surgeon: Belva Crome, MD;  Location: [facility];  Service: Cardiovascular;  Laterality: N/A;   CIRCUMCISION N/A 10/07/2019   Procedure: CIRCUMCISION ADULT;  Surgeon: Ardis Hughs, MD;  Location: [facility];  Service: Urology;  Laterality: N/A;   CORONARY ARTERY BYPASS GRAFT  2006   x3   ELECTROPHYSIOLOGIC STUDY N/A 08/24/2016   Procedure: SVT Ablation;  Surgeon: Will Meredith Leeds, MD;  Location: [facility];  Service: Cardiovascular;  Laterality: N/A;   PROSTATE SURGERY  2008   PTCA      FAMHx:  Family History  Problem Relation Age of Onset   Dementia Mother    Diabetes Sister     SOCHx:   reports that he quit smoking  about 4 years ago. His smoking use included cigarettes. He has a 30.00 pack-year smoking history. He has never used smokeless tobacco. He reports that he does not currently use alcohol. He reports that he does not use drugs.  ALLERGIES:  Allergies  Allergen Reactions   Scallops [[place]fish Allergy] Other (See Comments)    Rash; shortness of breath   Fluzone [Influenza Virus Vaccine] Nausea And Vomiting, Palpitations and Rash    ROS: Pertinent items noted in HPI and remainder of comprehensive ROS otherwise negative.  HOME MEDS: Current Outpatient Medications  Medication Sig Dispense Refill   albuterol (PROVENTIL) (2.5 MG/3ML) 0.083% nebulizer solution Take 3 mLs (2.5 mg total) by nebulization every 4 (four) hours as needed for wheezing or shortness of breath. 75 mL 12   atorvastatin (LIPITOR) 80 MG tablet Take 80 mg by mouth daily.     bisoprolol (ZEBETA) 5 MG tablet TAKE 1 TABLET(5 MG) BY MOUTH DAILY 90 tablet 3   colchicine 0.6 MG tablet Take 0.6 mg by mouth as needed.     empagliflozin (JARDIANCE) 25 MG TABS tablet Take 1 tablet (25 mg total) by mouth daily before breakfast. 30 tablet 5   ezetimibe (ZETIA) 10 MG tablet TAKE 1 TABLET(10 MG) BY MOUTH DAILY 90 tablet 2   FEROSUL 325 (65 Fe) MG tablet TAKE 1 TABLET(325 MG) BY MOUTH DAILY WITH BREAKFAST 30 tablet 5   formoterol (PERFOROMIST) 20 MCG/2ML nebulizer solution Take 2 mLs (20 mcg total) by nebulization 2 (two) times daily. 120 mL 11   furosemide (LASIX) 40 MG tablet TAKE 1 AND 1/2 TABLETS(60 MG) BY MOUTH DAILY 135 tablet 1   glimepiride (AMARYL) 4 MG tablet Take 4 mg by mouth daily with breakfast.     levothyroxine (SYNTHROID, LEVOTHROID) 88 MCG tablet Take 88 mcg by mouth daily before breakfast.  losartan (COZAAR) 25 MG tablet Take 25 mg by mouth daily.     metFORMIN (GLUCOPHAGE-XR) 500 MG 24 hr tablet Take 2,000 mg by mouth daily.     OXYGEN Inhale 3 L into the lungs at bedtime. 4 lpm with 24/7 AHC     pantoprazole  (PROTONIX) 40 MG tablet Take 40 mg by mouth daily.     aspirin 81 MG EC tablet Take 81 mg by mouth daily.     No current facility-administered medications for this visit.    LABS/IMAGING: No results found for this or any previous visit (from the past 48 hour(s)). No results found.  WEIGHTS: Wt Readings from Last 3 Encounters:  02/25/21 228 lb (103.4 kg)  02/24/21 244 lb (110.7 kg)  12/14/20 237 lb (107.5 kg)    VITALS: There were no vitals taken for this visit.  EXAM: General appearance: alert, no distress and moderately obese Neck: no carotid bruit and no JVD Lungs: diminished breath sounds bilaterally and wheezes bilaterally Heart: regular rate and rhythm, S1, S2 normal, no murmur, click, rub or gallop Abdomen: soft, non-tender; bowel sounds normal; no masses,  no organomegaly Extremities: extremities normal, atraumatic, no cyanosis or edema Pulses: 2+ and symmetric Skin: Skin color, texture, turgor normal. No rashes or lesions Neurologic: Grossly normal Psych: Pleasant  EKG: Normal sinus rhythm at 65, minimal voltage criteria for LVH, lateral T wave changes - personally reviewed  ASSESSMENT: Stable inferior and lateral T wave inversions, dyspnea Recent SVT which was symptomatic-repeat cardiac catheterization (05/2016) shows patent grafts, status post ablation of AV nodal reentry tachycardia (08/2016) CAD status post three-vessel CABG in 2006 (LIMA to LAD, SVG to OM1, SVG to RPDA) in [place] Patent bypass grafts by cath in 2015 History of PSVT-on metoprolol Dyslipidemia on Lipitor OSA-BiPAP recommended COPD Chronic systolic congestive heart failure-LVEF 45-50% -improved to 60 to 65% (12/2018)  Phimosis  PLAN: 1.   Jeremiah [name] seems to be doing well.  His A1c is significantly improved.  Blood pressure has been running on the little on the low side.  We will continue to monitor that no change in his medicine today although we could consider cutting back his bisoprolol  to 2.5 mg daily.  Heart function had improved and normal in 2020.  He may not need quite as much of his diuretic.  Would recommend decreasing his Lasix to 40 mg daily.  It may be that he is somewhat overdiuresed as his EF is normal.  Hopefully this will help his mild increase in the creatinine as well.  Follow-up with me in 6 months.  Pixie Casino, MD, [facility], [place] Director of the Advanced Lipid Disorders &  Cardiovascular Risk Reduction Clinic Diplomate of the American Board of Clinical Lipidology Attending Cardiologist  Direct Dial: [phone]  Fax: [phone]  Website:  [email or web address] Britnee Mcdevitt 07/18/2021, 1:57 PM

## 2021-07-18 NOTE — Patient Instructions (Addendum)
DECREASE FUROSEMIDE TO 40 MG ONCE DAILY= 1 TABLET ONCE DAILY    Follow-Up: At [facility], you and your health needs are our priority.  As part of our continuing mission to provide you with exceptional heart care, we have created designated Provider Care Teams.  These Care Teams include your primary Cardiologist (physician) and Advanced Practice Providers (APPs -  Physician Assistants and Nurse Practitioners) who all work together to provide you with the care you need, when you need it.  We recommend signing up for the patient portal called "MyChart".  Sign up information is provided on this After Visit Summary.  MyChart is used to connect with patients for Virtual Visits (Telemedicine).  Patients are able to view lab/test results, encounter notes, upcoming appointments, etc.  Non-urgent messages can be sent to your provider as well.   To learn more about what you can do with MyChart, go to [email or web address].    Your next appointment:   6 month(s)  The format for your next appointment:   In Person  Provider:   K. Mali Hilty, MD

## 2021-07-20 ENCOUNTER — Encounter: Payer: Self-pay | Admitting: Internal Medicine

## 2021-07-20 ENCOUNTER — Other Ambulatory Visit: Payer: Self-pay

## 2021-07-20 ENCOUNTER — Ambulatory Visit: Payer: Federal, State, Local not specified - PPO | Admitting: Internal Medicine

## 2021-07-20 DIAGNOSIS — J9611 Chronic respiratory failure with hypoxia: Secondary | ICD-10-CM

## 2021-07-20 DIAGNOSIS — J449 Chronic obstructive pulmonary disease, unspecified: Secondary | ICD-10-CM | POA: Diagnosis not present

## 2021-07-20 DIAGNOSIS — J9612 Chronic respiratory failure with hypercapnia: Secondary | ICD-10-CM

## 2021-07-20 MED ORDER — YUPELRI 175 MCG/3ML IN SOLN: 175.0000 ug | RESPIRATORY_TRACT | 11 refills | Status: DC

## 2021-07-20 MED ORDER — YUPELRI 175 MCG/3ML IN SOLN: 175.0000 ug | Freq: Every day | RESPIRATORY_TRACT | 0 refills | Status: DC

## 2021-07-20 NOTE — Assessment & Plan Note (Signed)
-  HCO3  03/02/17 = 37  -  04/01/2017 : Patient Saturations on Room Air at Rest = 96%  And  while Ambulating = 88% Patient Saturations on 2 Liters of oxygen while Ambulating = 94% - HCO3  12/26/18  = 34  -  07/20/2021  For best fit eval for portable   Advised 3lpm hs and daytime: Make sure you check your oxygen saturation  at your highest level of activity  to be sure it stays over 90% and adjust  02 flow upward to maintain this level if needed but remember to turn it back to previous settings when you stop (to conserve your supply).          Each maintenance medication was reviewed in detail including emphasizing most importantly the difference between maintenance and prns and under what circumstances the prns are to be triggered using an action plan format where appropriate.  Total time for H and P, chart review, counseling, reviewing neb/02 device(s) and generating customized AVS unique to this office visit / same day charting =39mn

## 2021-07-20 NOTE — Progress Notes (Signed)
Subjective:     Patient ID: Jeremiah [name], male   DOB: [birth year],    MRN: [id number]     Brief patient profile:  69  yowm MM/quit smoking 12/15/16 with onset of doe x 2008 and GOLD III criteria confirmed 10/28/2018 with restictive component due to obesity / prev eval for osa but could not tol cpap.  After treatment with a Lama/lama he improved to GOLD II criteria as of 02/05/2019     History of Present Illness   02/12/2017 1st [facility] office visit/ Jeremiah [name]  Re ? Copd  Chief Complaint  Patient presents with   Pulmonary Consult    Referred by Dr. Dorthy Cooler. Pt c/o SOB for the past 10 yrs. He gets SOB when running and working "which I don't do anymore".   MMRC2 = can't walk a nl pace on a flat grade s sob but does fine slow and flat eg shopping / limited also by R chronic ankle pain p fx rec Stop lopressor (metaprolol) and start bisoprolol 5 mg twice daily  If face/eye symptoms  worse or fever go back to ER  Please schedule a follow up office visit in 4 weeks, sooner if needed with pfts on return     Date of Admission: 12/25/2018                      Date of Discharge: 12/26/2018   Discharge Diagnoses/Problem List:  CHF exacerbation Symptomatic anemia COPD CAD DM-II HTN HLD Hypothyroidism OSA    Issues for Follow Up:  CHF - patient on bisoprolol and lasix at home.  Consider increasing lasix dose and adding ARB Anemia - patient anemic with low ferritin.  Get CBC Colonoscopy - patient overdue for colonoscopy.  Important in setting of anemia.   COPD - on duonebs at home PRN. Wheezing present on exam. Consider adding a controller medication.  Diabetes - consider discontinuing glimepiride given      01/01/2019  Extended post hosp f/u  ov/Jeremiah [name] re: copd/ chf/ anemia - not on maint rx, just duoneb prn  Chief Complaint  Patient presents with   Hospitalization Follow-up    Breathing has improved. He is now on 4lpm o2 24/7. CT Angiogram done today.    Dyspnea:  MMRC4  = sob if tries  to leave home or while getting dressed  Even on 02 but ok at rest sitting  Cough: min dry  Sleeping: 45 degrees  SABA use: duoneb helps the most 02: 4lpm 24 /7  rec Plan A = Automatic = performist 20 mcg twice daily in neb with yupelri 175 mcg each am  Plan B = Backup Only use your albuterol nebulizer as a rescue medication  Goal for 02 is to keep sats above 90% at all times so may be able to adjust back down to 2lpm at rest to save on protable tank  and turn up with walking to whatever setting needed to reach this goal. Please schedule a follow up office visit in 4 weeks, sooner if needed  with all medications /inhalers/ solutions in hand so we can verify exactly what you are taking. This includes all medications from all doctors and over the counters  - PFTs on return     02/05/2019  f/u ov/Jeremiah [name] re: GOLD II with reversible component  Chief Complaint  Patient presents with   Follow-up    PFT's done today. Breathing is doing well. He has not had to use his albuterol neb.  Dyspnea:  MMRC3 = can't walk 100 yards even at a slow pace at a flat grade s stopping due to sob   Cough: none Sleeping: sleeping 30 degrees  SABA use: no saba  02: 4lpm walking / 3 rest/ 3 sleeping  Rec No change nebulizers Keep working on weight loss > be sure you check your 02 saturations at rest/ activity with goal of keeping above 90% at all times Please schedule a follow up visit in 3 months but call sooner if needed     07/20/2021  f/u ov/Jeremiah [name] re: copd GOLD II/ MO with hypercabia/hypoxemia  maint on performist only in am  Chief Complaint  Patient presents with   Follow-up    Overdue follow-up. Doing well   Dyspnea:  shops at [employer]/ ht s 02 x sev aisles  = MMRC3 = can't walk 100 yards even at a slow pace at a flat grade s stopping due to sob   Cough: none  Sleeping:  bed is flat with 2 pillows SABA use: not using  02: 3 lpm hs and not typically checking/ titrating with exertion Covid status:   vax x 3     No obvious day to day or daytime variability or assoc excess/ purulent sputum or mucus plugs or hemoptysis or cp or chest tightness, subjective wheeze or overt sinus or hb symptoms.   Sleeping as above  without nocturnal  or early am exacerbation  of respiratory  c/o's or need for noct saba. Also denies any obvious fluctuation of symptoms with weather or environmental changes or other aggravating or alleviating factors except as outlined above   No unusual exposure hx or h/o childhood pna/ asthma or knowledge of premature birth.  Current Allergies, Complete Past Medical History, Past Surgical History, Family History, and Social History were reviewed in [employer] record.  ROS  The following are not active complaints unless bolded Hoarseness, sore throat, dysphagia, dental problems, itching, sneezing,  nasal congestion or discharge of excess mucus or purulent secretions, ear ache,   fever, chills, sweats, unintended wt loss or wt gain, classically pleuritic or exertional cp,  orthopnea pnd or arm/hand swelling  or leg swelling, presyncope, palpitations, abdominal pain, anorexia, nausea, vomiting, diarrhea  or change in bowel habits or change in bladder habits, change in stools or change in urine, dysuria, hematuria,  rash, arthralgias, visual complaints, headache, numbness, weakness or ataxia or problems with walking or coordination,  change in mood or  memory.        Current Meds  Medication Sig   aspirin 81 MG EC tablet Take 81 mg by mouth daily.   atorvastatin (LIPITOR) 80 MG tablet Take 80 mg by mouth daily.   bisoprolol (ZEBETA) 5 MG tablet TAKE 1 TABLET(5 MG) BY MOUTH DAILY   colchicine 0.6 MG tablet Take 0.6 mg by mouth as needed.   empagliflozin (JARDIANCE) 25 MG TABS tablet Take 1 tablet (25 mg total) by mouth daily before breakfast.   ezetimibe (ZETIA) 10 MG tablet TAKE 1 TABLET(10 MG) BY MOUTH DAILY   FEROSUL 325 (65 Fe) MG tablet TAKE 1 TABLET(325 MG) BY  MOUTH DAILY WITH BREAKFAST   formoterol (PERFOROMIST) 20 MCG/2ML nebulizer solution Take 2 mLs (20 mcg total) by nebulization 2 (two) times daily.   furosemide (LASIX) 40 MG tablet Take 1 tablet (40 mg total) by mouth daily.   glimepiride (AMARYL) 4 MG tablet Take 4 mg by mouth daily with breakfast.   levothyroxine (SYNTHROID, LEVOTHROID) 88 MCG  tablet Take 88 mcg by mouth daily before breakfast.   losartan (COZAAR) 25 MG tablet Take 25 mg by mouth daily.   metFORMIN (GLUCOPHAGE-XR) 500 MG 24 hr tablet Take 2,000 mg by mouth daily.   OXYGEN Inhale 3 L into the lungs at bedtime. 4 lpm with 24/7 AHC   pantoprazole (PROTONIX) 40 MG tablet Take 40 mg by mouth daily.                   Objective:   Physical Exam  07/20/2021       238  02/05/2019         251  01/01/2019       255  10/28/2018     252  08/26/2017       246  05/02/2017       248  04/01/2017      244   02/12/17 248 lb (112.5 kg)  02/11/17 239 lb (108.4 kg)  01/02/17 245 lb (111.1 kg)     Vital signs reviewed  07/20/2021  - Note at rest 02 sats  92% on RA   General appearance:    chronically ill appearing amb wm and      HEENT : pt wearing mask not removed for exam due to covid - 19 concerns.   NECK :  without JVD/Nodes/TM/ nl carotid upstrokes bilaterally   LUNGS: no acc muscle use,  Min barrel  contour chest wall with bilateral  slightly decreased bs s audible wheeze and  without cough on insp or exp maneuvers and min  Hyperresonant  to  percussion bilaterally     CV:  RRR  no s3 or murmur or increase in P2, and no edema   ABD:  quit obese soft and nontender with pos end  insp Hoover's  in the supine position. No bruits or organomegaly appreciated, bowel sounds nl  MS:   Nl gait/  ext warm without deformities, calf tenderness, cyanosis or clubbing No obvious joint restrictions   SKIN: warm and dry with mild chronic venous stasis changes both LEs   NEURO:  alert, approp, nl sensorium with  no motor or cerebellar  deficits apparent.                 Assessment:

## 2021-07-20 NOTE — Patient Instructions (Addendum)
Add yupelri 175 mcg daily with your perforomist    Add another performist 12 hours later if you are planning activity for the evening  Make sure you check your oxygen saturation  at your highest level of activity  to be sure it stays over 90% and adjust  02 flow upward to maintain this level if needed but remember to turn it back to previous settings when you stop (to conserve your supply).    Please schedule a follow up visit in 12 months but call sooner if needed

## 2021-07-20 NOTE — Assessment & Plan Note (Signed)
Quit smoking 12/2016 Spirometry 02/12/2017  FEV1 0.79 (20%)  Ratio 45 but f/v loop is junk - trial of selective BB 02/12/2017  - PFT's  04/01/2017  FEV1 1.29 ( 44% ) ratio 55  p no % improvement from saba p ?  prior to study with DLCO  65/67 % corrects to 85  % for alv volume  - 05/02/2017  After extensive coaching HFA effectiveness =    75% > try bevespi samples x 2 weeks  > no objective change so did not continue  - Referred to rehab 08/26/2017 > improved - PFT's  10/28/2018  FEV1 1.25 (43 % ) ratio 59  p 7 % improvement from saba p nothing prior to study with DLCO  67 % corrects to 88 % for alv volume  With mild curvature/ insp truncation s plateau  - alpha one screen 10/28/2018   MM  Level 144  - 01/01/2019 changed to yupelri/ performist  PFT's  02/05/2019  FEV1 1.53 (53 % ) ratio 0.76  p 20 % improvement from saba p performist/yupelri prior to study with DLCO  61/80c % corrects to 120  % for alv volume   -note follow-up flow volume loop suggests a non-fixed and inspiratory truncation c/w the effects of morbid obesity on the upper airway  in patients with a history of sleep apnea though no classic sawtooth pattern is present.  - added yupelri back 07/20/2021 >>>   Pt is Group B in terms of symptom/risk and laba/lama therefore appropriate rx at this point >>>  Add yupelri back to performist

## 2021-07-26 ENCOUNTER — Telehealth: Payer: Self-pay | Admitting: Internal Medicine

## 2021-07-26 DIAGNOSIS — J9612 Chronic respiratory failure with hypercapnia: Secondary | ICD-10-CM

## 2021-07-26 DIAGNOSIS — J9611 Chronic respiratory failure with hypoxia: Secondary | ICD-10-CM

## 2021-07-26 NOTE — Telephone Encounter (Signed)
Order put in for POC. Called and spoke with patient. Made him aware that the referral has been put in. Patient verbalized understanding.   Nothing further needed at this time.

## 2021-07-26 NOTE — Telephone Encounter (Signed)
Please see message below. Patient states an order was to be sent in for a portable concentrator. Is it ok to send the order in?  Dr. Melvyn Novas please advise  Thank you

## 2021-07-29 DIAGNOSIS — I5033 Acute on chronic diastolic (congestive) heart failure: Secondary | ICD-10-CM | POA: Diagnosis not present

## 2021-07-29 DIAGNOSIS — G4733 Obstructive sleep apnea (adult) (pediatric): Secondary | ICD-10-CM | POA: Diagnosis not present

## 2021-08-11 ENCOUNTER — Telehealth: Payer: Self-pay | Admitting: Internal Medicine

## 2021-08-11 NOTE — Telephone Encounter (Signed)
Sent message to Leory Plowman new to check status of POC. Will update note once I hear back.  Will hold message in triage.

## 2021-08-16 DIAGNOSIS — X32XXXA Exposure to sunlight, initial encounter: Secondary | ICD-10-CM | POA: Diagnosis not present

## 2021-08-16 DIAGNOSIS — L570 Actinic keratosis: Secondary | ICD-10-CM | POA: Diagnosis not present

## 2021-08-16 DIAGNOSIS — L720 Epidermal cyst: Secondary | ICD-10-CM | POA: Diagnosis not present

## 2021-08-16 DIAGNOSIS — C44310 Basal cell carcinoma of skin of unspecified parts of face: Secondary | ICD-10-CM | POA: Diagnosis not present

## 2021-08-24 ENCOUNTER — Other Ambulatory Visit: Payer: Self-pay | Admitting: Internal Medicine

## 2021-08-29 DIAGNOSIS — I5033 Acute on chronic diastolic (congestive) heart failure: Secondary | ICD-10-CM | POA: Diagnosis not present

## 2021-08-29 DIAGNOSIS — G4733 Obstructive sleep apnea (adult) (pediatric): Secondary | ICD-10-CM | POA: Diagnosis not present

## 2021-09-01 ENCOUNTER — Other Ambulatory Visit: Payer: Self-pay | Admitting: Internal Medicine

## 2021-09-01 DIAGNOSIS — D125 Benign neoplasm of sigmoid colon: Secondary | ICD-10-CM | POA: Diagnosis not present

## 2021-09-01 DIAGNOSIS — K5730 Diverticulosis of large intestine without perforation or abscess without bleeding: Secondary | ICD-10-CM | POA: Diagnosis not present

## 2021-09-01 DIAGNOSIS — K648 Other hemorrhoids: Secondary | ICD-10-CM | POA: Diagnosis not present

## 2021-09-01 DIAGNOSIS — D120 Benign neoplasm of cecum: Secondary | ICD-10-CM | POA: Diagnosis not present

## 2021-09-01 DIAGNOSIS — R195 Other fecal abnormalities: Secondary | ICD-10-CM | POA: Diagnosis not present

## 2021-09-01 DIAGNOSIS — D122 Benign neoplasm of ascending colon: Secondary | ICD-10-CM | POA: Diagnosis not present

## 2021-09-01 DIAGNOSIS — D123 Benign neoplasm of transverse colon: Secondary | ICD-10-CM | POA: Diagnosis not present

## 2021-09-01 DIAGNOSIS — D124 Benign neoplasm of descending colon: Secondary | ICD-10-CM | POA: Diagnosis not present

## 2021-09-01 DIAGNOSIS — Z1211 Encounter for screening for malignant neoplasm of colon: Secondary | ICD-10-CM | POA: Diagnosis not present

## 2021-09-12 ENCOUNTER — Other Ambulatory Visit: Payer: Self-pay | Admitting: Internal Medicine

## 2021-09-25 DIAGNOSIS — E119 Type 2 diabetes mellitus without complications: Secondary | ICD-10-CM | POA: Diagnosis not present

## 2021-09-25 DIAGNOSIS — Z7984 Long term (current) use of oral hypoglycemic drugs: Secondary | ICD-10-CM | POA: Diagnosis not present

## 2021-09-26 ENCOUNTER — Telehealth: Payer: Self-pay | Admitting: Internal Medicine

## 2021-09-26 DIAGNOSIS — J449 Chronic obstructive pulmonary disease, unspecified: Secondary | ICD-10-CM

## 2021-09-27 DIAGNOSIS — M25512 Pain in left shoulder: Secondary | ICD-10-CM | POA: Diagnosis not present

## 2021-09-27 NOTE — Telephone Encounter (Signed)
I called and spoke with the pt  He states [facility] told him they never received POC order  He has been been with them for o2 for 3 years approx  I advised that they are only dispensing POC's to new starts or pt's that have been with them 6 months or less  He wants to call around and see if he can switch to another DME  He says that he will call back with what he wants to do  If he stays with [facility] we can try sending best fir order for lighter tanks  Will await his call back

## 2021-09-28 DIAGNOSIS — I5033 Acute on chronic diastolic (congestive) heart failure: Secondary | ICD-10-CM | POA: Diagnosis not present

## 2021-09-28 DIAGNOSIS — G4733 Obstructive sleep apnea (adult) (pediatric): Secondary | ICD-10-CM | POA: Diagnosis not present

## 2021-09-29 NOTE — Telephone Encounter (Signed)
Spoke with pt who stated he talked to [facility] and they would give him POC if he provided paper RX. Paper Rx was printed, signed and places up front for pt pick up on Monday 10/02/21. Nothing further needed at this point.

## 2021-09-29 NOTE — Telephone Encounter (Signed)
Patient would like handwritten order for POC. Patient to pick up in the office 10/02/2021. Patient phone number is [phone].

## 2021-10-03 DIAGNOSIS — M25512 Pain in left shoulder: Secondary | ICD-10-CM | POA: Diagnosis not present

## 2021-10-04 ENCOUNTER — Other Ambulatory Visit: Payer: Self-pay

## 2021-10-04 ENCOUNTER — Ambulatory Visit: Payer: Federal, State, Local not specified - PPO | Attending: Family Medicine

## 2021-10-04 DIAGNOSIS — M25512 Pain in left shoulder: Secondary | ICD-10-CM | POA: Insufficient documentation

## 2021-10-04 DIAGNOSIS — G8929 Other chronic pain: Secondary | ICD-10-CM | POA: Insufficient documentation

## 2021-10-04 DIAGNOSIS — M25612 Stiffness of left shoulder, not elsewhere classified: Secondary | ICD-10-CM | POA: Diagnosis not present

## 2021-10-04 DIAGNOSIS — M6281 Muscle weakness (generalized): Secondary | ICD-10-CM | POA: Insufficient documentation

## 2021-10-04 NOTE — Therapy (Signed)
[facility] @ [place], [place], [address] Phone: [phone]   Fax:  [phone]  Physical Therapy Evaluation  Patient Details  Name: Jeremiah [name] MRN: [id number] Date of Birth: 1946/05/11 Referring Provider (PT): Dorthy Cooler, Dibas/Alexander, Marylyn Ishihara, MD   Encounter Date: 10/04/2021   PT End of Session - 10/04/21 1610     Visit Number 1    Date for PT Re-Evaluation 11/29/21    Authorization Type [id number] remaining for year    Authorization - Visit Number 1    Authorization - Number of Visits 53    Progress Note Due on Visit 10    PT Start Time 1453    PT Stop Time 1528    PT Time Calculation (min) 35 min    Activity Tolerance Patient tolerated treatment well    Behavior During Therapy [facility] for tasks assessed/performed             Past Medical History:  Diagnosis Date   A-fib ([place])    Anemia    Aortic valve sclerosis 12/2018   Noted on ECHO   Arthritis    CAD (coronary artery disease)    a. 2006: CABG in 2006 with LIMA-LAD, SVG-OM1, and SVG-RPDA   Cardiomegaly 12/2018   Stable, noted on CXR   Carpal tunnel syndrome    Right   Chronic pain 03/21/2016   Colon cancer ([place]) 2006   COPD (chronic obstructive pulmonary disease) ([place])    Diabetes mellitus without complication ([place])    DVT (deep venous thrombosis) ([place])    Right   Essential hypertension 03/21/2016   GERD (gastroesophageal reflux disease)    History of blood transfusion    History of Clostridioides difficile infection    History of prostate cancer 03/21/2016   History of PSVT (paroxysmal supraventricular tachycardia)    HLD (hyperlipidemia)    HTN (hypertension)    Hx of CABG 2006   Hypercholesteremia 03/21/2016   Hypothyroidism    LAE (left atrial enlargement) 12/2018   Severe, Noted on ECHO   LVH (left ventricular hypertrophy) 12/2018   Mild, Noted on ECHO   Medication management 03/21/2016   Mitral annular calcification 12/2018    with mild MS, Noted on ECHO   Morbid obesity ([place]) 03/21/2016   Myocardial infarct ([facility])    OSA (obstructive sleep apnea) 03/21/2016   uses oxygen at night time   Pain in right ankle and joints of right foot 03/21/2016   Paronychia 03/21/2016   Phimosis    Pneumonia    Primary insomnia 03/21/2016   Prostate cancer ([place]) 2008   Pulmonary hypertension ([place]) 12/2018   Moderate, Noted on ECHO   Tobacco dependence 03/21/2016   Tricuspid regurgitation 12/2018   Mild, Noted on ECHO    Past Surgical History:  Procedure Laterality Date   ANKLE SURGERY Right 12/2013   CARDIAC CATHETERIZATION N/A 05/08/2016   Procedure: Left Heart Cath and Cors/Grafts Angiography;  Surgeon: Belva Crome, MD;  Location: [facility];  Service: Cardiovascular;  Laterality: N/A;   CIRCUMCISION N/A 10/07/2019   Procedure: CIRCUMCISION ADULT;  Surgeon: Ardis Hughs, MD;  Location: [facility];  Service: Urology;  Laterality: N/A;   CORONARY ARTERY BYPASS GRAFT  2006   x3   ELECTROPHYSIOLOGIC STUDY N/A 08/24/2016   Procedure: SVT Ablation;  Surgeon: Will Meredith Leeds, MD;  Location: [facility];  Service: Cardiovascular;  Laterality: N/A;   PROSTATE SURGERY  2008   PTCA  There were no vitals filed for this visit.    Subjective Assessment - 10/04/21 1454     Subjective Pt reports 2+ month history of Lt shoulder pain.  Pt was pulling his stove and felt pain at that time.  Pain has been present since this time.  Pt has remained active with his arm and has continued to aggravate his symptoms.  Pt just finished PT for Rt tennis elbow in August.    Pertinent History a-fib, CAD, colon cancer, DM, MI, sleep apnea, Rt tennis elbow.    Diagnostic tests Dr Sheppard Coil: diagnostic US- rotator cuff tear on Lt    Patient Stated Goals reduce Lt shoulder pain with use, improve Lt shoulder strength, A/ROM and use    Currently in Pain? Yes    Pain Score --   up to 10/10 with use   Pain Location Shoulder    Pain  Orientation Left    Pain Descriptors / Indicators Grimacing;Moaning;Stabbing    Pain Type Chronic pain    Pain Frequency Intermittent    Aggravating Factors  when moving Lt UE for use (pain lasts 5 min after movement), sleep in bed (sleeping in recliner)    Pain Relieving Factors not using Lt UE                [facility] PT Assessment - 10/04/21 0001       Assessment   Medical Diagnosis Lt anterior shoulder pain/rotator cuff tear    Referring Provider (PT) [place], Dibas/Alexander, Marylyn Ishihara, MD    Onset Date/Surgical Date 07/31/21    Hand Dominance Right    Prior Therapy none for shoulder      Precautions   Precautions None      Restrictions   Weight Bearing Restrictions No      Balance Screen   Has the patient fallen in the past 6 months No    Has the patient had a decrease in activity level because of a fear of falling?  No    Is the patient reluctant to leave their home because of a fear of falling?  No      Home Environment   Living Environment Private residence    Living Arrangements Spouse/significant other    Type of [place]      Prior Function   Level of [place] Retired    Leisure home repairs      Cognition   Overall Cognitive Status Within Functional Limits for tasks assessed      Observation/Other Assessments   Focus on Therapeutic Outcomes (FOTO)  34 (61 is goal)      Posture/Postural Control   Posture/Postural Control Postural limitations    Postural Limitations Forward head;Rounded Shoulders;Flexed trunk      ROM / Strength   AROM / PROM / Strength AROM;PROM;Strength      AROM   Overall AROM  Deficits    Overall AROM Comments Rt shoulder A/ROM is full.    AROM Assessment Site Shoulder    Right/Left Shoulder Left    Left Shoulder Flexion 90 Degrees    Left Shoulder ABduction 72 Degrees    Left Shoulder Internal Rotation --   L5   Left Shoulder External Rotation --   to occiput     PROM   Overall PROM  Deficits     Overall PROM Comments Pain at end range.    PROM Assessment Site Shoulder    Right/Left Shoulder Left    Left Shoulder  Flexion 130 Degrees    Left Shoulder ABduction 90 Degrees    Left Shoulder External Rotation 30 Degrees      Strength   Overall Strength Deficits    Overall Strength Comments Rt shoulder 4+/5    Strength Assessment Site Shoulder    Right/Left Shoulder Left    Left Shoulder Flexion 3/5    Left Shoulder ABduction 3+/5    Left Shoulder Internal Rotation 4+/5    Left Shoulder External Rotation 3-/5      Palpation   Palpation comment palpalbe tenderness over Rt anterior shoulder      Ambulation/Gait   Gait Pattern Within Functional Limits    Ambulation Surface Level                        Objective measurements completed on examination: See above findings.                PT Education - 10/04/21 1527     Education Details Access Code: [email or web address]    Person(s) Educated Patient    Methods Explanation;Demonstration;Handout    Comprehension Verbalized understanding;Returned demonstration              PT Short Term Goals - 10/04/21 1502       PT SHORT TERM GOAL #1   Title be independent in initial HEP    Time 4    Period Weeks    Status New    Target Date 11/01/21      PT SHORT TERM GOAL #2   Title report < or = to 7/10 Lt shoulder pain with use    Baseline 10/10    Time 4    Period Weeks    Status New    Target Date 11/01/21      PT SHORT TERM GOAL #3   Title report > or = to 40% use of Lt UE with functional tasks    Baseline 20%    Time 4    Period Weeks    Status New    Target Date 11/01/21      PT SHORT TERM GOAL #4   Title demonstrate Lt shoulder A/ROM flexion to > or = to 100 degrees for overhead reaching    Time 4    Period Weeks    Status New    Target Date 11/01/21               PT Long Term Goals - 10/04/21 1603       PT LONG TERM GOAL #1   Title be independent in advanced HEP    Time 8     Period Weeks    Status New    Target Date 11/29/21      PT LONG TERM GOAL #2   Title improve FOTO to > or = to 61 to improve function    Baseline 34    Time 8    Period Weeks    Status New    Target Date 11/29/21      PT LONG TERM GOAL #3   Title demonstrate > or = to Lt shoulder flexion to > or = to 120 degrees to improve reaching overhead    Time 8    Period Weeks    Status New    Target Date 11/29/21      PT LONG TERM GOAL #4   Title report > or = to 60% use of Lt UE for daily tasks due to  improved functional strength    Time 8    Period Weeks    Status New    Target Date 11/29/21      PT LONG TERM GOAL #5   Title report < or = to 5/10 Lt shoulder pain with reaching and lifting    Time 8    Period Weeks    Status New    Target Date 11/29/21      Additional Long Term Goals   Additional Long Term Goals Yes      PT LONG TERM GOAL #6   Title demonstrate Lt UE ER to T2 to improve self-care    Time 8    Period Weeks    Status New    Target Date 11/29/21                    Plan - 10/04/21 1538     Clinical Impression Statement Pt presents to PT with Lt shoulder pain that began ~2 months ago when he was moving a stove. Pt continued to use his arm for house repairs and has continued to aggravate the injury.   Pt reports limited Lt shoulder use, A/ROM and strength and 10/10 pain with use. Pain also increases with sleep and he is now sleeping in a recliner. Pt demonstrates reduced Lt shoulder A/ROM and strength with scapular substitution with movement against gravity.  P/ROM is limited with pain at end range. Pt reports 20% use of Lt UE with ADLs and functional tasks due to pain, weakness and limited ROM.  Pt will benefit from skilled PT to address Lt shoulder movement, strength and help pt avoid surgery.    Personal Factors and Comorbidities Comorbidity 2    Comorbidities DM, a-fib    Examination-Activity Limitations Sleep;Carry;Lift;Reach Overhead     Examination-Participation Restrictions Laundry;Yard Work    Stability/Clinical Decision Making Stable/Uncomplicated    Clinical Decision Making Moderate    Rehab Potential Good    PT Frequency 2x / week    PT Duration 8 weeks    PT Treatment/Interventions ADLs/Self Care Home Management;Cryotherapy;Electrical Stimulation;Moist Heat;Therapeutic exercise;Therapeutic activities;Functional mobility training;Patient/family education;Manual techniques;Passive range of motion;Dry needling;Taping;Neuromuscular re-education    PT Next Visit Plan Pulleys, finger ladder, review HEP, isometrics, Arm bike. P/ROM    PT Home Exercise Plan Access Code: [email or web address]    Consulted and Agree with Plan of Care Patient             Patient will benefit from skilled therapeutic intervention in order to improve the following deficits and impairments:  Decreased activity tolerance, Decreased range of motion, Decreased endurance, Decreased strength, Hypomobility, Impaired flexibility, Postural dysfunction, Improper body mechanics, Pain, Increased muscle spasms  Visit Diagnosis: Chronic left shoulder pain - Plan: PT plan of care cert/re-cert  Muscle weakness (generalized) - Plan: PT plan of care cert/re-cert  Stiffness of left shoulder, not elsewhere classified - Plan: PT plan of care cert/re-cert     Problem List Patient Active Problem List   Diagnosis Date Noted   Type 2 diabetes mellitus with hyperglycemia, without long-term current use of insulin ([place]) 04/29/2020   Type 2 diabetes mellitus with stage 3a chronic kidney disease, without long-term current use of insulin ([place]) 04/29/2020   Hypertriglyceridemia 04/29/2020   CHF exacerbation ([place]) 12/25/2018   Carotid artery calcification 09/09/2018   Coronary artery disease involving coronary bypass graft of native heart without angina pectoris 07/22/2017   Acute on chronic systolic (congestive) heart failure ([place]) 06/10/2017   COPD GOLD II  with reversibility  04/03/2017   Chronic respiratory failure with hypoxia and hypercapnia ([facility]) 04/03/2017   Acute pulmonary edema ([facility])    Acute on chronic diastolic ACC/AHA stage C congestive heart failure ([facility])    Acute exacerbation of congestive heart failure ([place]) 02/26/2017   Hyperkalemia 02/26/2017   Prolonged QT interval 02/26/2017   Anemia 02/26/2017   Skin abscess 02/13/2017   Dyspnea on exertion 02/12/2017   Enteritis due to Clostridium difficile 01/05/2017   GI bleed 01/03/2017   Symptomatic anemia 01/03/2017   Hypothyroidism, acquired 01/03/2017   Cough    Diarrhea    AVNRT (AV nodal re-entry tachycardia) ([place]) 08/24/2016   Chest pain 05/05/2016   NSTEMI (non-ST elevated myocardial infarction) ([place]) 05/05/2016   SVT (supraventricular tachycardia) ([place]) 03/21/2016   S/P CABG x 3 03/21/2016   Morbid obesity due to excess calories ([place]) complicated by hbp/hyperlipidemia  03/21/2016   Essential hypertension 03/21/2016   Hyperlipidemia LDL goal <70 03/21/2016   Paronychia 03/21/2016   Tobacco dependence 03/21/2016   Pain in right ankle and joints of right foot 03/21/2016   Chronic pain 03/21/2016   OSA (obstructive sleep apnea) 03/21/2016   Medication management 03/21/2016   History of prostate cancer 03/21/2016   Primary insomnia 03/21/2016   Post-traumatic osteoarthritis of right ankle 11/07/2015   Closed displaced fracture of medial malleolus of right tibia 10/24/2015   Sigurd Sos, PT 10/04/21 5:03 PM   [facility] @ [place], [place], [address] Phone: [phone]   Fax:  [phone]  Name: Jeremiah [name] MRN: [id number] Date of Birth: [birth year]

## 2021-10-04 NOTE — Patient Instructions (Signed)
Access Code: [email or web address] URL: [email or web address] Date: 10/04/2021 Prepared by: Claiborne Billings  Exercises Supine Shoulder Flexion AAROM with Hands Clasped - 3 x daily - 7 x weekly - 1 sets - 10 reps - 10 hold Seated Scapular Retraction - 3 x daily - 7 x weekly - 10 reps - 5 hold Shoulder Flexion Wall Slide with Towel - 3 x daily - 7 x weekly - 1 sets - 10 reps - 10 hold Standing Shoulder Abduction Slides at Wall - 1 x daily - 7 x weekly - 1 sets - 10 reps - 10 hold

## 2021-10-10 ENCOUNTER — Other Ambulatory Visit: Payer: Self-pay

## 2021-10-10 ENCOUNTER — Ambulatory Visit: Payer: Federal, State, Local not specified - PPO

## 2021-10-10 DIAGNOSIS — G8929 Other chronic pain: Secondary | ICD-10-CM

## 2021-10-10 DIAGNOSIS — M6281 Muscle weakness (generalized): Secondary | ICD-10-CM | POA: Diagnosis not present

## 2021-10-10 DIAGNOSIS — M25512 Pain in left shoulder: Secondary | ICD-10-CM | POA: Diagnosis not present

## 2021-10-10 DIAGNOSIS — M25612 Stiffness of left shoulder, not elsewhere classified: Secondary | ICD-10-CM

## 2021-10-10 NOTE — Therapy (Signed)
[place] @ [place] [place], [place], [address] Phone: [phone]   Fax:  [phone]  Physical Therapy Treatment  Patient Details  Name: Jeremiah [name] MRN: [id number] Date of Birth: [birth year] Referring Provider (PT): Dorthy Cooler, Dibas/Alexander, Marylyn Ishihara, MD   Encounter Date: 10/10/2021   PT End of Session - 10/10/21 1330     Visit Number 2    Date for PT Re-Evaluation 11/29/21    Authorization Type BCBS [place] remaining for year    Authorization - Visit Number 2    Authorization - Number of Visits 53    Progress Note Due on Visit 10    PT Start Time 1232    PT Stop Time 3474    PT Time Calculation (min) 45 min    Activity Tolerance Patient tolerated treatment well    Behavior During Therapy [facility] for tasks assessed/performed             Past Medical History:  Diagnosis Date   A-fib ([place])    Anemia    Aortic valve sclerosis 12/2018   Noted on ECHO   Arthritis    CAD (coronary artery disease)    a. 2006: CABG in 2006 with LIMA-LAD, SVG-OM1, and SVG-RPDA   Cardiomegaly 12/2018   Stable, noted on CXR   Carpal tunnel syndrome    Right   Chronic pain 03/21/2016   Colon cancer ([place]) 2006   COPD (chronic obstructive pulmonary disease) ([place])    Diabetes mellitus without complication ([place])    DVT (deep venous thrombosis) ([place])    Right   Essential hypertension 03/21/2016   GERD (gastroesophageal reflux disease)    History of blood transfusion    History of Clostridioides difficile infection    History of prostate cancer 03/21/2016   History of PSVT (paroxysmal supraventricular tachycardia)    HLD (hyperlipidemia)    HTN (hypertension)    Hx of CABG 2006   Hypercholesteremia 03/21/2016   Hypothyroidism    LAE (left atrial enlargement) 12/2018   Severe, Noted on ECHO   LVH (left ventricular hypertrophy) 12/2018   Mild, Noted on ECHO   Medication management 03/21/2016   Mitral annular calcification 12/2018    with mild MS, Noted on ECHO   Morbid obesity ([place]) 03/21/2016   Myocardial infarct (HCC)    OSA (obstructive sleep apnea) 03/21/2016   uses oxygen at night time   Pain in right ankle and joints of right foot 03/21/2016   Paronychia 03/21/2016   Phimosis    Pneumonia    Primary insomnia 03/21/2016   Prostate cancer ([place]) 2008   Pulmonary hypertension ([place]) 12/2018   Moderate, Noted on ECHO   Tobacco dependence 03/21/2016   Tricuspid regurgitation 12/2018   Mild, Noted on ECHO    Past Surgical History:  Procedure Laterality Date   ANKLE SURGERY Right 12/2013   CARDIAC CATHETERIZATION N/A 05/08/2016   Procedure: Left Heart Cath and Cors/Grafts Angiography;  Surgeon: Belva Crome, MD;  Location: [place] [facility];  Service: Cardiovascular;  Laterality: N/A;   CIRCUMCISION N/A 10/07/2019   Procedure: CIRCUMCISION ADULT;  Surgeon: Ardis Hughs, MD;  Location: [facility];  Service: Urology;  Laterality: N/A;   CORONARY ARTERY BYPASS GRAFT  2006   x3   ELECTROPHYSIOLOGIC STUDY N/A 08/24/2016   Procedure: SVT Ablation;  Surgeon: Will Meredith Leeds, MD;  Location: [place] [facility];  Service: Cardiovascular;  Laterality: N/A;   PROSTATE SURGERY  2008   PTCA  There were no vitals filed for this visit.   Subjective Assessment - 10/10/21 1239     Subjective Patient states he continues to have pain.  He admits he hasn't been able to do the exercises much.  The shoulder only hurts with movement.  No pain at rest.    Pertinent History a-fib, CAD, colon cancer, DM, MI, sleep apnea, Rt tennis elbow.    Diagnostic tests Dr Sheppard Coil: diagnostic US- rotator cuff tear on Lt    Patient Stated Goals reduce Lt shoulder pain with use, improve Lt shoulder strength, A/ROM and use    Currently in Pain? No/denies                               [facility] Adult PT Treatment/Exercise - 10/10/21 0001       Neck Exercises: Machines for Strengthening   UBE (Upper Arm Bike) 5 min seat 10  Level 1      Shoulder Exercises: Supine   Other Supine Exercises scapular protraction (shoulder punches) x 20      Shoulder Exercises: Prone   Extension Strengthening;Left;10 reps;Other (comment)   Had to do fwd bent standing due to patient unable to do prone position.   Horizontal ABduction 1 Strengthening;Left;10 reps   no weight 2 sets of 10 fwd bent   Other Prone Exercises fwd bent row x 20 no weight      Shoulder Exercises: Sidelying   External Rotation Strengthening;Left;20 reps   modified sidelying over bolster no weight x 20                    PT Education - 10/10/21 1328     Education Details continue current HEP,  educated patient on importance of using ice or anti inflammatories to control symptoms so that the mechanics improve with the exercises.    Person(s) Educated Patient    Methods Explanation;Demonstration;Verbal cues;Tactile cues    Comprehension Verbalized understanding;Returned demonstration;Verbal cues required;Tactile cues required              PT Short Term Goals - 10/04/21 1502       PT SHORT TERM GOAL #1   Title be independent in initial HEP    Time 4    Period Weeks    Status New    Target Date 11/01/21      PT SHORT TERM GOAL #2   Title report < or = to 7/10 Lt shoulder pain with use    Baseline 10/10    Time 4    Period Weeks    Status New    Target Date 11/01/21      PT SHORT TERM GOAL #3   Title report > or = to 40% use of Lt UE with functional tasks    Baseline 20%    Time 4    Period Weeks    Status New    Target Date 11/01/21      PT SHORT TERM GOAL #4   Title demonstrate Lt shoulder A/ROM flexion to > or = to 100 degrees for overhead reaching    Time 4    Period Weeks    Status New    Target Date 11/01/21               PT Long Term Goals - 10/04/21 1603       PT LONG TERM GOAL #1   Title be independent in advanced HEP  Time 8    Period Weeks    Status New    Target Date 11/29/21      PT LONG  TERM GOAL #2   Title improve FOTO to > or = to 61 to improve function    Baseline 34    Time 8    Period Weeks    Status New    Target Date 11/29/21      PT LONG TERM GOAL #3   Title demonstrate > or = to Lt shoulder flexion to > or = to 120 degrees to improve reaching overhead    Time 8    Period Weeks    Status New    Target Date 11/29/21      PT LONG TERM GOAL #4   Title report > or = to 60% use of Lt UE for daily tasks due to improved functional strength    Time 8    Period Weeks    Status New    Target Date 11/29/21      PT LONG TERM GOAL #5   Title report < or = to 5/10 Lt shoulder pain with reaching and lifting    Time 8    Period Weeks    Status New    Target Date 11/29/21      Additional Long Term Goals   Additional Long Term Goals Yes      PT LONG TERM GOAL #6   Title demonstrate Lt UE ER to T2 to improve self-care    Time 8    Period Weeks    Status New    Target Date 11/29/21                   Plan - 10/10/21 1331     Clinical Impression Statement Patient states left shoulder is about the same.  He admits he hasn't done his HEP consistently.  He was able to complete rotator cuff exercises today but requires quite a bit of modification on positioning due to multiple orthopedic injuries in the past.  He had no pain during or after the exercises.  He required visual, verbal and tactile cues for correct technique for all exercises due to modified positions.    Personal Factors and Comorbidities Comorbidity 2    Comorbidities DM, a-fib    Examination-Activity Limitations Sleep;Carry;Lift;Reach Overhead    Examination-Participation Restrictions Laundry;Yard Work    Stability/Clinical Decision Making Stable/Uncomplicated    Rehab Potential Good    PT Frequency 2x / week    PT Duration 8 weeks    PT Treatment/Interventions ADLs/Self Care Home Management;Cryotherapy;Electrical Stimulation;Moist Heat;Therapeutic exercise;Therapeutic activities;Functional  mobility training;Patient/family education;Manual techniques;Passive range of motion;Dry needling;Taping;Neuromuscular re-education    PT Next Visit Plan Pulleys, finger ladder, review HEP, isometrics, Arm bike. P/ROM    PT Home Exercise Plan Access Code: [email or web address]    Consulted and Agree with Plan of Care Patient             Patient will benefit from skilled therapeutic intervention in order to improve the following deficits and impairments:  Decreased activity tolerance, Decreased range of motion, Decreased endurance, Decreased strength, Hypomobility, Impaired flexibility, Postural dysfunction, Improper body mechanics, Pain, Increased muscle spasms  Visit Diagnosis: Muscle weakness (generalized)  Stiffness of left shoulder, not elsewhere classified  Chronic left shoulder pain     Problem List Patient Active Problem List   Diagnosis Date Noted   Type 2 diabetes mellitus with hyperglycemia, without long-term current use of insulin ([place]) 04/29/2020  Type 2 diabetes mellitus with stage 3a chronic kidney disease, without long-term current use of insulin ([place]) 04/29/2020   Hypertriglyceridemia 04/29/2020   CHF exacerbation ([place]) 12/25/2018   Carotid artery calcification 09/09/2018   Coronary artery disease involving coronary bypass graft of native heart without angina pectoris 07/22/2017   Acute on chronic systolic (congestive) heart failure ([place]) 06/10/2017   COPD GOLD II with reversibility 04/03/2017   Chronic respiratory failure with hypoxia and hypercapnia ([place]) 04/03/2017   Acute pulmonary edema (HCC)    Acute on chronic diastolic ACC/AHA stage C congestive heart failure (HCC)    Acute exacerbation of congestive heart failure ([place]) 02/26/2017   Hyperkalemia 02/26/2017   Prolonged QT interval 02/26/2017   Anemia 02/26/2017   Skin abscess 02/13/2017   Dyspnea on exertion 02/12/2017   Enteritis due to Clostridium difficile 01/05/2017   GI bleed 01/03/2017   Symptomatic  anemia 01/03/2017   Hypothyroidism, acquired 01/03/2017   Cough    Diarrhea    AVNRT (AV nodal re-entry tachycardia) ([place]) 08/24/2016   Chest pain 05/05/2016   NSTEMI (non-ST elevated myocardial infarction) ([place]) 05/05/2016   SVT (supraventricular tachycardia) ([place]) 03/21/2016   S/P CABG x 3 03/21/2016   Morbid obesity due to excess calories ([place]) complicated by hbp/hyperlipidemia  03/21/2016   Essential hypertension 03/21/2016   Hyperlipidemia LDL goal <70 03/21/2016   Paronychia 03/21/2016   Tobacco dependence 03/21/2016   Pain in right ankle and joints of right foot 03/21/2016   Chronic pain 03/21/2016   OSA (obstructive sleep apnea) 03/21/2016   Medication management 03/21/2016   History of prostate cancer 03/21/2016   Primary insomnia 03/21/2016   Post-traumatic osteoarthritis of right ankle 11/07/2015   Closed displaced fracture of medial malleolus of right tibia 10/24/2015   Anderson [place] B. Velera [place], PT 11/08/221:37 PM   [place] @ [address] [place], [place], [address] Phone: [phone]   Fax:  [phone]  Name: Jeremiah [name] MRN: [id number] Date of Birth: 11-08-1946

## 2021-10-10 NOTE — Patient Instructions (Signed)
Instructed patient to continue current HEP and monitor pain from todays exercises.  Explained we will add todays shoulder exercises to HEP if no increase in pain.

## 2021-10-12 ENCOUNTER — Other Ambulatory Visit: Payer: Self-pay

## 2021-10-12 ENCOUNTER — Ambulatory Visit: Payer: Federal, State, Local not specified - PPO

## 2021-10-12 DIAGNOSIS — M25612 Stiffness of left shoulder, not elsewhere classified: Secondary | ICD-10-CM | POA: Diagnosis not present

## 2021-10-12 DIAGNOSIS — G8929 Other chronic pain: Secondary | ICD-10-CM | POA: Diagnosis not present

## 2021-10-12 DIAGNOSIS — M6281 Muscle weakness (generalized): Secondary | ICD-10-CM

## 2021-10-12 DIAGNOSIS — M25512 Pain in left shoulder: Secondary | ICD-10-CM | POA: Diagnosis not present

## 2021-10-12 NOTE — Therapy (Signed)
[place] @ [place], [place], [address] Phone: [phone]   Fax:  [phone]  Physical Therapy Treatment  Patient Details  Name: Jeremiah [name] MRN: [id number] Date of Birth: 20-Jul-1946 Referring Provider (PT): Dorthy Cooler, Dibas/Alexander, Marylyn Ishihara, MD   Encounter Date: 10/12/2021   PT End of Session - 10/12/21 1229     Visit Number 3    Date for PT Re-Evaluation 11/29/21    Authorization Type [employer]BS [employer] remaining for year    Authorization - Visit Number 3    Authorization - Number of Visits 53    Progress Note Due on Visit 10    PT Start Time 1147    PT Stop Time 1227    PT Time Calculation (min) 40 min    Activity Tolerance Patient tolerated treatment well    Behavior During Therapy [facility] for tasks assessed/performed             Past Medical History:  Diagnosis Date   A-fib ([place])    Anemia    Aortic valve sclerosis 12/2018   Noted on ECHO   Arthritis    CAD (coronary artery disease)    a. 2006: CABG in 2006 with LIMA-LAD, SVG-OM1, and SVG-RPDA   Cardiomegaly 12/2018   Stable, noted on CXR   Carpal tunnel syndrome    Right   Chronic pain 03/21/2016   Colon cancer ([place]) 2006   COPD (chronic obstructive pulmonary disease) ([place])    Diabetes mellitus without complication ([place])    DVT (deep venous thrombosis) ([place])    Right   Essential hypertension 03/21/2016   GERD (gastroesophageal reflux disease)    History of blood transfusion    History of Clostridioides difficile infection    History of prostate cancer 03/21/2016   History of PSVT (paroxysmal supraventricular tachycardia)    HLD (hyperlipidemia)    HTN (hypertension)    Hx of CABG 2006   Hypercholesteremia 03/21/2016   Hypothyroidism    LAE (left atrial enlargement) 12/2018   Severe, Noted on ECHO   LVH (left ventricular hypertrophy) 12/2018   Mild, Noted on ECHO   Medication management 03/21/2016   Mitral annular calcification 12/2018    with mild MS, Noted on ECHO   Morbid obesity ([place]) 03/21/2016   Myocardial infarct (HCC)    OSA (obstructive sleep apnea) 03/21/2016   uses oxygen at night time   Pain in right ankle and joints of right foot 03/21/2016   Paronychia 03/21/2016   Phimosis    Pneumonia    Primary insomnia 03/21/2016   Prostate cancer ([place]) 2008   Pulmonary hypertension ([place]) 12/2018   Moderate, Noted on ECHO   Tobacco dependence 03/21/2016   Tricuspid regurgitation 12/2018   Mild, Noted on ECHO    Past Surgical History:  Procedure Laterality Date   ANKLE SURGERY Right 12/2013   CARDIAC CATHETERIZATION N/A 05/08/2016   Procedure: Left Heart Cath and Cors/Grafts Angiography;  Surgeon: Belva Crome, MD;  Location: [place] [facility];  Service: Cardiovascular;  Laterality: N/A;   CIRCUMCISION N/A 10/07/2019   Procedure: CIRCUMCISION ADULT;  Surgeon: Ardis Hughs, MD;  Location: [facility];  Service: Urology;  Laterality: N/A;   CORONARY ARTERY BYPASS GRAFT  2006   x3   ELECTROPHYSIOLOGIC STUDY N/A 08/24/2016   Procedure: SVT Ablation;  Surgeon: Will Meredith Leeds, MD;  Location: [place] [facility];  Service: Cardiovascular;  Laterality: N/A;   PROSTATE SURGERY  2008   PTCA  There were no vitals filed for this visit.   Subjective Assessment - 10/12/21 1154     Subjective I only have pain when I move my arm.  I could "feel it" after my last session.  I have been more consistent with my exercises.    Patient Stated Goals reduce Lt shoulder pain with use, improve Lt shoulder strength, A/ROM and use    Pain Orientation Left                [facility] PT Assessment - 10/12/21 0001       AROM   Left Shoulder Flexion 107 Degrees    Left Shoulder ABduction 80 Degrees    Left Shoulder Internal Rotation --   to L4   Left Shoulder External Rotation --   to occiput                          [facility] Adult PT Treatment/Exercise - 10/12/21 0001       Neck Exercises: Machines for  Strengthening   UBE (Upper Arm Bike) 6 min (3/3) seat 10 Level 1- PT present to disucss progress      Shoulder Exercises: Prone   Extension Strengthening;Left;10 reps;Other (comment)   Had to do fwd bent standing due to patient unable to do prone position.   Extension Weight (lbs) tactile cues for deltoid activation    Horizontal ABduction 1 Strengthening;Left;10 reps   no weight 2 sets of 10 fwd bent     Shoulder Exercises: Standing   Flexion Strengthening;AAROM;Left;20 reps    Flexion Limitations using finger ladder      Shoulder Exercises: Pulleys   Flexion 3 minutes      Shoulder Exercises: ROM/Strengthening   [place] on wall flexion x 2 min      Manual Therapy   Manual Therapy Passive ROM    Manual therapy comments flexion, abduction, IR/ER to pt tolerance                       PT Short Term Goals - 10/12/21 1229       PT SHORT TERM GOAL #1   Title be independent in initial HEP    Status On-going      PT SHORT TERM GOAL #2   Title report < or = to 7/10 Lt shoulder pain with use    Baseline 7-8/10    Status On-going      PT SHORT TERM GOAL #4   Title demonstrate Lt shoulder A/ROM flexion to > or = to 100 degrees for overhead reaching    Baseline 107    Status Achieved               PT Long Term Goals - 10/04/21 1603       PT LONG TERM GOAL #1   Title be independent in advanced HEP    Time 8    Period Weeks    Status New    Target Date 11/29/21      PT LONG TERM GOAL #2   Title improve FOTO to > or = to 61 to improve function    Baseline 34    Time 8    Period Weeks    Status New    Target Date 11/29/21      PT LONG TERM GOAL #3   Title demonstrate > or = to Lt shoulder flexion to > or = to 120 degrees to improve reaching  overhead    Time 8    Period Weeks    Status New    Target Date 11/29/21      PT LONG TERM GOAL #4   Title report > or = to 60% use of Lt UE for daily tasks due to improved functional strength    Time 8     Period Weeks    Status New    Target Date 11/29/21      PT LONG TERM GOAL #5   Title report < or = to 5/10 Lt shoulder pain with reaching and lifting    Time 8    Period Weeks    Status New    Target Date 11/29/21      Additional Long Term Goals   Additional Long Term Goals Yes      PT LONG TERM GOAL #6   Title demonstrate Lt UE ER to T2 to improve self-care    Time 8    Period Weeks    Status New    Target Date 11/29/21                   Plan - 10/12/21 1228     Clinical Impression Statement Patient states left shoulder is about the same.  He did feel good after last session. He is more consistent with HEP.  Pt required tactile cues for scapular depression with movement of Lt UE against gravity.  Pt demonstrates improved Lt shoulder flexion, abduction and IR today.    He had no pain during or after the exercises.  Pt will continue to benefit from skilled PT to address strength, flexibility and functional use of Lt UE.    PT Frequency 2x / week    PT Duration 8 weeks    PT Treatment/Interventions ADLs/Self Care Home Management;Cryotherapy;Electrical Stimulation;Moist Heat;Therapeutic exercise;Therapeutic activities;Functional mobility training;Patient/family education;Manual techniques;Passive range of motion;Dry needling;Taping;Neuromuscular re-education    PT Next Visit Plan Pulleys, finger ladder, add to HEP, isometrics, Arm bike. P/ROM    PT Home Exercise Plan Access Code: [email or web address]    Consulted and Agree with Plan of Care Patient             Patient will benefit from skilled therapeutic intervention in order to improve the following deficits and impairments:  Decreased activity tolerance, Decreased range of motion, Decreased endurance, Decreased strength, Hypomobility, Impaired flexibility, Postural dysfunction, Improper body mechanics, Pain, Increased muscle spasms  Visit Diagnosis: Muscle weakness (generalized)  Stiffness of left shoulder, not elsewhere  classified  Chronic left shoulder pain     Problem List Patient Active Problem List   Diagnosis Date Noted   Type 2 diabetes mellitus with hyperglycemia, without long-term current use of insulin ([place]) 04/29/2020   Type 2 diabetes mellitus with stage 3a chronic kidney disease, without long-term current use of insulin ([place]) 04/29/2020   Hypertriglyceridemia 04/29/2020   CHF exacerbation ([place]) 12/25/2018   Carotid artery calcification 09/09/2018   Coronary artery disease involving coronary bypass graft of native heart without angina pectoris 07/22/2017   Acute on chronic systolic (congestive) heart failure ([place]) 06/10/2017   COPD GOLD II with reversibility 04/03/2017   Chronic respiratory failure with hypoxia and hypercapnia ([place]) 04/03/2017   Acute pulmonary edema (HCC)    Acute on chronic diastolic ACC/AHA stage C congestive heart failure ([place])    Acute exacerbation of congestive heart failure ([place]) 02/26/2017   Hyperkalemia 02/26/2017   Prolonged QT interval 02/26/2017   Anemia 02/26/2017   Skin abscess 02/13/2017  Dyspnea on exertion 02/12/2017   Enteritis due to Clostridium difficile 01/05/2017   GI bleed 01/03/2017   Symptomatic anemia 01/03/2017   Hypothyroidism, acquired 01/03/2017   Cough    Diarrhea    AVNRT (AV nodal re-entry tachycardia) ([place]) 08/24/2016   Chest pain 05/05/2016   NSTEMI (non-ST elevated myocardial infarction) ([place]) 05/05/2016   SVT (supraventricular tachycardia) ([place]) 03/21/2016   S/P CABG x 3 03/21/2016   Morbid obesity due to excess calories ([place]) complicated by hbp/hyperlipidemia  03/21/2016   Essential hypertension 03/21/2016   Hyperlipidemia LDL goal <70 03/21/2016   Paronychia 03/21/2016   Tobacco dependence 03/21/2016   Pain in right ankle and joints of right foot 03/21/2016   Chronic pain 03/21/2016   OSA (obstructive sleep apnea) 03/21/2016   Medication management 03/21/2016   History of prostate cancer 03/21/2016   Primary insomnia  03/21/2016   Post-traumatic osteoarthritis of right ankle 11/07/2015   Closed displaced fracture of medial malleolus of right tibia 10/24/2015    Sigurd Sos, PT 10/12/21 12:31 PM   [place] @ [address] [place], [place], [address] Phone: [phone]   Fax:  [phone]  Name: Corby Vandenberghe MRN: [id number] Date of Birth: 30-Sep-1946

## 2021-10-17 ENCOUNTER — Ambulatory Visit: Payer: Federal, State, Local not specified - PPO

## 2021-10-17 ENCOUNTER — Other Ambulatory Visit: Payer: Self-pay

## 2021-10-17 DIAGNOSIS — M6281 Muscle weakness (generalized): Secondary | ICD-10-CM | POA: Diagnosis not present

## 2021-10-17 DIAGNOSIS — M25612 Stiffness of left shoulder, not elsewhere classified: Secondary | ICD-10-CM

## 2021-10-17 DIAGNOSIS — M25512 Pain in left shoulder: Secondary | ICD-10-CM | POA: Diagnosis not present

## 2021-10-17 DIAGNOSIS — G8929 Other chronic pain: Secondary | ICD-10-CM | POA: Diagnosis not present

## 2021-10-17 NOTE — Therapy (Signed)
[facility] [place], [place], [address] Phone: [phone]   Fax:  [phone]  Physical Therapy Treatment  Patient Details  Name: Jeremiah [name] MRN: [id number] Date of Birth: Apr 02, 1946 Referring Provider (PT): Dorthy Cooler, [place]/Alexander, Marylyn Ishihara, MD   Encounter Date: 10/17/2021   PT End of Session - 10/17/21 1320     Visit Number 4    Date for PT Re-Evaluation 11/29/21    Authorization Type [id number] remaining for year    Authorization - Visit Number 4    Authorization - Number of Visits 53    Progress Note Due on Visit 10    PT Start Time 1102    PT Stop Time 1142    PT Time Calculation (min) 40 min    Activity Tolerance Patient tolerated treatment well    Behavior During Therapy [facility] for tasks assessed/performed             Past Medical History:  Diagnosis Date   A-fib ([place])    Anemia    Aortic valve sclerosis 12/2018   Noted on ECHO   Arthritis    CAD (coronary artery disease)    a. 2006: CABG in 2006 with LIMA-LAD, SVG-OM1, and SVG-RPDA   Cardiomegaly 12/2018   Stable, noted on CXR   Carpal tunnel syndrome    Right   Chronic pain 03/21/2016   Colon cancer ([place]) 2006   COPD (chronic obstructive pulmonary disease) ([place])    Diabetes mellitus without complication ([place])    DVT (deep venous thrombosis) ([place])    Right   Essential hypertension 03/21/2016   GERD (gastroesophageal reflux disease)    History of blood transfusion    History of Clostridioides difficile infection    History of prostate cancer 03/21/2016   History of PSVT (paroxysmal supraventricular tachycardia)    HLD (hyperlipidemia)    HTN (hypertension)    Hx of CABG 2006   Hypercholesteremia 03/21/2016   Hypothyroidism    LAE (left atrial enlargement) 12/2018   Severe, Noted on ECHO   LVH (left ventricular hypertrophy) 12/2018   Mild, Noted on ECHO   Medication management 03/21/2016   Mitral annular calcification 12/2018    with mild MS, Noted on ECHO   Morbid obesity ([place]) 03/21/2016   Myocardial infarct (HCC)    OSA (obstructive sleep apnea) 03/21/2016   uses oxygen at night time   Pain in right ankle and joints of right foot 03/21/2016   Paronychia 03/21/2016   Phimosis    Pneumonia    Primary insomnia 03/21/2016   Prostate cancer ([place]) 2008   Pulmonary hypertension ([place]) 12/2018   Moderate, Noted on ECHO   Tobacco dependence 03/21/2016   Tricuspid regurgitation 12/2018   Mild, Noted on ECHO    Past Surgical History:  Procedure Laterality Date   ANKLE SURGERY Right 12/2013   CARDIAC CATHETERIZATION N/A 05/08/2016   Procedure: Left Heart Cath and Cors/Grafts Angiography;  Surgeon: Belva Crome, MD;  Location: [facility];  Service: Cardiovascular;  Laterality: N/A;   CIRCUMCISION N/A 10/07/2019   Procedure: CIRCUMCISION ADULT;  Surgeon: Ardis Hughs, MD;  Location: [facility];  Service: Urology;  Laterality: N/A;   CORONARY ARTERY BYPASS GRAFT  2006   x3   ELECTROPHYSIOLOGIC STUDY N/A 08/24/2016   Procedure: SVT Ablation;  Surgeon: Will Meredith Leeds, MD;  Location: [facility];  Service: Cardiovascular;  Laterality: N/A;   PROSTATE SURGERY  2008   PTCA  There were no vitals filed for this visit.   Subjective Assessment - 10/17/21 1104     Subjective Patient states no change in left shoulder pain.  He states he woke up and shoulder was hurting and its been that way all morning.  He rates this pain at  6/10. He states he did not do any projects over the weekend.  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 He is opposed to any cortisone and has informed MD that he will not have surgery.    Pertinent History a-fib, CAD, colon cancer, DM, MI, sleep apnea, Rt tennis elbow.    Diagnostic tests Dr Sheppard Coil: diagnostic US- rotator cuff tear on Lt    Patient Stated Goals reduce Lt shoulder pain with use, improve Lt shoulder strength, A/ROM and use    Currently in  Pain? Yes    Pain Score 6     Pain Location Shoulder    Pain Orientation Left    Pain Descriptors / Indicators Aching    Pain Type Chronic pain    Pain Onset 1 to 4 weeks ago    Pain Frequency Several days a week                               [facility] Adult PT Treatment/Exercise - 10/17/21 0001       Neck Exercises: Machines for Strengthening   UBE (Upper Arm Bike) 6 min (3/3) seat 10 Level 1- PT present to disucss progress      Shoulder Exercises: Seated   External Rotation Strengthening;Both;20 reps;Theraband    Theraband Level (Shoulder External Rotation) Level 2 (Red)    Abduction Strengthening;Both;20 reps;Theraband    Theraband Level (Shoulder ABduction) Level 2 (Red)      Shoulder Exercises: Prone   Extension Strengthening;Left;10 reps;Other (comment)   Had to do fwd bent standing due to patient unable to do prone position.   Extension Weight (lbs) tactile cues for deltoid activation    Horizontal ABduction 1 Strengthening;Left;10 reps   no weight 2 sets of 10 fwd bent   Other Prone Exercises fwd bent row x 20 no weight      Shoulder Exercises: Sidelying   External Rotation Strengthening;Left;20 reps   modified sidelying over bolster no weight x 20     Shoulder Exercises: Standing   Flexion Strengthening;AAROM;Left;20 reps    Flexion Limitations using finger ladder                     PT Education - 10/17/21 1317     Education Details Lengthy discussion about his aversion to cortisone or surgery and that if therapy does not seem to be helping, there may not be any other options.  Explained that when pain is chronic and constant that we must get the inflammation down in order for the exercises to be effective and thus correct the mechanics of what is causing the pain.    Person(s) Educated Patient    Methods Explanation    Comprehension Verbalized understanding              PT Short Term Goals - 10/12/21 1229       PT SHORT TERM  GOAL #1   Title be independent in initial HEP    Status On-going      PT SHORT TERM GOAL #2   Title report < or = to 7/10 Lt shoulder pain with  use    Baseline 7-8/10    Status On-going      PT SHORT TERM GOAL #4   Title demonstrate Lt shoulder A/ROM flexion to > or = to 100 degrees for overhead reaching    Baseline 107    Status Achieved               PT Long Term Goals - 10/04/21 1603       PT LONG TERM GOAL #1   Title be independent in advanced HEP    Time 8    Period Weeks    Status New    Target Date 11/29/21      PT LONG TERM GOAL #2   Title improve FOTO to > or = to 61 to improve function    Baseline 34    Time 8    Period Weeks    Status New    Target Date 11/29/21      PT LONG TERM GOAL #3   Title demonstrate > or = to Lt shoulder flexion to > or = to 120 degrees to improve reaching overhead    Time 8    Period Weeks    Status New    Target Date 11/29/21      PT LONG TERM GOAL #4   Title report > or = to 60% use of Lt UE for daily tasks due to improved functional strength    Time 8    Period Weeks    Status New    Target Date 11/29/21      PT LONG TERM GOAL #5   Title report < or = to 5/10 Lt shoulder pain with reaching and lifting    Time 8    Period Weeks    Status New    Target Date 11/29/21      Additional Long Term Goals   Additional Long Term Goals Yes      PT LONG TERM GOAL #6   Title demonstrate Lt UE ER to T2 to improve self-care    Time 8    Period Weeks    Status New    Target Date 11/29/21                   Plan - 10/17/21 1144     Clinical Impression Statement Patient is somewhat closed off to all treatment options.  He states he will not entertain the idea of cortisone or surgery.  He is also having trouble sleeping in bed due to several other orthopedic issues which makes it difficult to get him into position for several of his exercises.   We had a lengthy discussion today about the fact that if the shoulder  pain is persistant then the exercises are less effective.  He understands that consistent use of ice and doing the exercises consistently is vital to his progress. He would also benefit from anti inflammatory if ok to take with his current meds and if primary MD approves.   He does seem to be compliant with his HEP.  The supraspinatus and infraspinatus appear to be most involved and fatigue easy when isolated.  He would benefit from continued skilled PT for rotator cuff strengthening and shoulder stability exercises.    Personal Factors and Comorbidities Comorbidity 2    Comorbidities DM, a-fib    Examination-Activity Limitations Sleep;Carry;Lift;Reach Overhead    Examination-Participation Restrictions Laundry;Yard Work    Stability/Clinical Decision Making Stable/Uncomplicated    Clinical Decision Making Moderate  Rehab Potential Good    PT Frequency 2x / week    PT Duration 8 weeks    PT Treatment/Interventions ADLs/Self Care Home Management;Cryotherapy;Electrical Stimulation;Moist Heat;Therapeutic exercise;Therapeutic activities;Functional mobility training;Patient/family education;Manual techniques;Passive range of motion;Dry needling;Taping;Neuromuscular re-education    PT Next Visit Plan Continue left UE strengthening and ROM along with arm bike.  Add in shoulder stability such as ball rolls on wall.    PT Home Exercise Plan Access Code: [email or web address]  URL: [email or web address]  Date: 10/17/2021  Prepared by: Candyce Churn    Exercises  Supine Shoulder Flexion AAROM with Hands Clasped - 3 x daily - 7 x weekly - 1 sets - 10 reps - 10 hold  Seated Scapular Retraction - 3 x daily - 7 x weekly - 10 reps - 5 hold  Shoulder Flexion Wall Slide with Towel - 3 x daily - 7 x weekly - 1 sets - 10 reps - 10 hold  Standing Shoulder Abduction Slides at Wall - 1 x daily - 7 x weekly - 1 sets - 10 reps - 10 hold  Single Arm Bent Over Shoulder Extension with Dumbbell - 1 x daily - 7 x weekly - 2 sets  - 10 reps  Bent Over Single Arm Shoulder Row with Dumbbell - 1 x daily - 7 x weekly - 2 sets - 10 reps  Single Arm Bent Over Shoulder Horizontal Abduction with Dumbbell - Palm Down - 1 x daily - 7 x weekly - 2 sets - 10 reps  Shoulder External Rotation and Scapular Retraction with Resistance - 1 x daily - 7 x weekly - 2 sets - 10 reps  Seated Shoulder External Rotation in Abduction Supported with Dumbbell - 1 x daily - 7 x weekly - 2 sets - 10 reps  Seated Shoulder Horizontal Abduction with Resistance - 1 x daily - 7 x weekly - 2 sets - 10 reps    Consulted and Agree with Plan of Care Patient             Patient will benefit from skilled therapeutic intervention in order to improve the following deficits and impairments:  Decreased activity tolerance, Decreased range of motion, Decreased endurance, Decreased strength, Hypomobility, Impaired flexibility, Postural dysfunction, Improper body mechanics, Pain, Increased muscle spasms  Visit Diagnosis: Muscle weakness (generalized)  Stiffness of left shoulder, not elsewhere classified  Chronic left shoulder pain     Problem List Patient Active Problem List   Diagnosis Date Noted   Type 2 diabetes mellitus with hyperglycemia, without long-term current use of insulin (HCC) 04/29/2020   Type 2 diabetes mellitus with stage 3a chronic kidney disease, without long-term current use of insulin ([place]) 04/29/2020   Hypertriglyceridemia 04/29/2020   CHF exacerbation ([place]) 12/25/2018   Carotid artery calcification 09/09/2018   Coronary artery disease involving coronary bypass graft of native heart without angina pectoris 07/22/2017   Acute on chronic systolic (congestive) heart failure ([place]) 06/10/2017   COPD GOLD II with reversibility 04/03/2017   Chronic respiratory failure with hypoxia and hypercapnia (HCC) 04/03/2017   Acute pulmonary edema (HCC)    Acute on chronic diastolic ACC/AHA stage C congestive heart failure (HCC)    Acute exacerbation of  congestive heart failure ([place]) 02/26/2017   Hyperkalemia 02/26/2017   Prolonged QT interval 02/26/2017   Anemia 02/26/2017   Skin abscess 02/13/2017   Dyspnea on exertion 02/12/2017   Enteritis due to Clostridium difficile 01/05/2017   GI bleed 01/03/2017   Symptomatic anemia 01/03/2017   Hypothyroidism, acquired  01/03/2017   Cough    Diarrhea    AVNRT (AV nodal re-entry tachycardia) ([place]) 08/24/2016   Chest pain 05/05/2016   NSTEMI (non-ST elevated myocardial infarction) ([place]) 05/05/2016   SVT (supraventricular tachycardia) ([place]) 03/21/2016   S/P CABG x 3 03/21/2016   Morbid obesity due to excess calories ([place]) complicated by hbp/hyperlipidemia  03/21/2016   Essential hypertension 03/21/2016   Hyperlipidemia LDL goal <70 03/21/2016   Paronychia 03/21/2016   Tobacco dependence 03/21/2016   Pain in right ankle and joints of right foot 03/21/2016   Chronic pain 03/21/2016   OSA (obstructive sleep apnea) 03/21/2016   Medication management 03/21/2016   History of prostate cancer 03/21/2016   Primary insomnia 03/21/2016   Post-traumatic osteoarthritis of right ankle 11/07/2015   Closed displaced fracture of medial malleolus of right tibia 10/24/2015    Anderson [place] B. Mishal Probert, PT 11/15/221:33 PM   [facility] @ [place] [employer] [place], [place], [address] Phone: [phone]   Fax:  [phone]  Name: Jeremiah [name] MRN: [id number] Date of Birth: [birth year]

## 2021-10-17 NOTE — Patient Instructions (Signed)
Continue HEP focusing on correct technique,  added isolated RC exercises to HEP Access Code: [email or web address] URL: [email or web address] Date: 10/17/2021 Prepared by: Candyce Churn  Exercises Supine Shoulder Flexion AAROM with Hands Clasped - 3 x daily - 7 x weekly - 1 sets - 10 reps - 10 hold Seated Scapular Retraction - 3 x daily - 7 x weekly - 10 reps - 5 hold Shoulder Flexion Wall Slide with Towel - 3 x daily - 7 x weekly - 1 sets - 10 reps - 10 hold Standing Shoulder Abduction Slides at Wall - 1 x daily - 7 x weekly - 1 sets - 10 reps - 10 hold Single Arm Bent Over Shoulder Extension with Dumbbell - 1 x daily - 7 x weekly - 2 sets - 10 reps Bent Over Single Arm Shoulder Row with Dumbbell - 1 x daily - 7 x weekly - 2 sets - 10 reps Single Arm Bent Over Shoulder Horizontal Abduction with Dumbbell - Palm Down - 1 x daily - 7 x weekly - 2 sets - 10 reps Shoulder External Rotation and Scapular Retraction with Resistance - 1 x daily - 7 x weekly - 2 sets - 10 reps Seated Shoulder External Rotation in Abduction Supported with Dumbbell - 1 x daily - 7 x weekly - 2 sets - 10 reps Seated Shoulder Horizontal Abduction with Resistance - 1 x daily - 7 x weekly - 2 sets - 10 reps

## 2021-10-23 ENCOUNTER — Telehealth: Payer: Self-pay | Admitting: Internal Medicine

## 2021-10-23 DIAGNOSIS — J449 Chronic obstructive pulmonary disease, unspecified: Secondary | ICD-10-CM

## 2021-10-23 NOTE — Telephone Encounter (Signed)
Called and spoke with pt letting him know that we would place order for him to receive a new nebulizer machine and pt verbalized understanding. Order placed. Nothing further needed.

## 2021-10-24 ENCOUNTER — Other Ambulatory Visit: Payer: Self-pay

## 2021-10-24 ENCOUNTER — Ambulatory Visit: Payer: Federal, State, Local not specified - PPO

## 2021-10-24 DIAGNOSIS — M25512 Pain in left shoulder: Secondary | ICD-10-CM | POA: Diagnosis not present

## 2021-10-24 DIAGNOSIS — M25612 Stiffness of left shoulder, not elsewhere classified: Secondary | ICD-10-CM

## 2021-10-24 DIAGNOSIS — M6281 Muscle weakness (generalized): Secondary | ICD-10-CM | POA: Diagnosis not present

## 2021-10-24 DIAGNOSIS — G8929 Other chronic pain: Secondary | ICD-10-CM | POA: Diagnosis not present

## 2021-10-24 NOTE — Patient Instructions (Signed)
[id number]   Added AAROM including pulleys for home

## 2021-10-24 NOTE — Therapy (Signed)
[facility] @ [place], [place], [address] Phone: [phone]   Fax:  [phone]  Physical Therapy Treatment  Patient Details  Name: Jeremiah [name] MRN: [id number] Date of Birth: Sep 06, 1946 Referring Provider (PT): Dorthy Cooler, Dibas/Alexander, Marylyn Ishihara, MD   Encounter Date: 10/24/2021   PT End of Session - 10/24/21 1329     Visit Number 5    Date for PT Re-Evaluation 11/29/21    Authorization Type [employer] Federal-53 remaining for year    Authorization - Visit Number 5    Authorization - Number of Visits 53    Progress Note Due on Visit 10    PT Start Time 7902    PT Stop Time 1320    PT Time Calculation (min) 50 min    Activity Tolerance Patient tolerated treatment well    Behavior During Therapy [facility] for tasks assessed/performed             Past Medical History:  Diagnosis Date   A-fib ([place])    Anemia    Aortic valve sclerosis 12/2018   Noted on ECHO   Arthritis    CAD (coronary artery disease)    a. 2006: CABG in 2006 with LIMA-LAD, SVG-OM1, and SVG-RPDA   Cardiomegaly 12/2018   Stable, noted on CXR   Carpal tunnel syndrome    Right   Chronic pain 03/21/2016   Colon cancer ([place]) 2006   COPD (chronic obstructive pulmonary disease) ([place])    Diabetes mellitus without complication ([place])    DVT (deep venous thrombosis) ([place])    Right   Essential hypertension 03/21/2016   GERD (gastroesophageal reflux disease)    History of blood transfusion    History of Clostridioides difficile infection    History of prostate cancer 03/21/2016   History of PSVT (paroxysmal supraventricular tachycardia)    HLD (hyperlipidemia)    HTN (hypertension)    Hx of CABG 2006   Hypercholesteremia 03/21/2016   Hypothyroidism    LAE (left atrial enlargement) 12/2018   Severe, Noted on ECHO   LVH (left ventricular hypertrophy) 12/2018   Mild, Noted on ECHO   Medication management 03/21/2016   Mitral annular calcification 12/2018    with mild MS, Noted on ECHO   Morbid obesity ([place]) 03/21/2016   Myocardial infarct (HCC)    OSA (obstructive sleep apnea) 03/21/2016   uses oxygen at night time   Pain in right ankle and joints of right foot 03/21/2016   Paronychia 03/21/2016   Phimosis    Pneumonia    Primary insomnia 03/21/2016   Prostate cancer ([place]) 2008   Pulmonary hypertension ([place]) 12/2018   Moderate, Noted on ECHO   Tobacco dependence 03/21/2016   Tricuspid regurgitation 12/2018   Mild, Noted on ECHO    Past Surgical History:  Procedure Laterality Date   ANKLE SURGERY Right 12/2013   CARDIAC CATHETERIZATION N/A 05/08/2016   Procedure: Left Heart Cath and Cors/Grafts Angiography;  Surgeon: Belva Crome, MD;  Location: [place] [facility];  Service: Cardiovascular;  Laterality: N/A;   CIRCUMCISION N/A 10/07/2019   Procedure: CIRCUMCISION ADULT;  Surgeon: Ardis Hughs, MD;  Location: [facility];  Service: Urology;  Laterality: N/A;   CORONARY ARTERY BYPASS GRAFT  2006   x3   ELECTROPHYSIOLOGIC STUDY N/A 08/24/2016   Procedure: SVT Ablation;  Surgeon: Will Meredith Leeds, MD;  Location: [place] CV LAB;  Service: Cardiovascular;  Laterality: N/A;   PROSTATE SURGERY  2008   PTCA  There were no vitals filed for this visit.   Subjective Assessment - 10/24/21 1243     Subjective Patient reports the left shoulder is a little better but still unable to do anything active like I used to.  I washed the kitchen cabinets and walked the dogs this morning.  I have pain in my other shoulder as well so I have to switch the leashes back and forth.  Overall more mobility but still painful.    Pertinent History a-fib, CAD, colon cancer, DM, MI, sleep apnea, Rt tennis elbow.    Diagnostic tests Dr Sheppard Coil: diagnostic US- rotator cuff tear on Lt    Patient Stated Goals reduce Lt shoulder pain with use, improve Lt shoulder strength, A/ROM and use    Currently in Pain? Yes    Pain Score 4     Pain Location Shoulder     Pain Orientation Left    Pain Descriptors / Indicators Aching    Pain Onset 1 to 4 weeks ago                               [facility] Adult PT Treatment/Exercise - 10/24/21 0001       Neck Exercises: Machines for Strengthening   UBE (Upper Arm Bike) 6 min (3/3) seat 10 Level 1- PT present to disucss progress      Shoulder Exercises: Pulleys   Flexion 2 minutes    Scaption 2 minutes      Shoulder Exercises: Stretch   Internal Rotation Stretch 10 seconds    Internal Rotation Stretch Limitations towel stretch    External Rotation Stretch 5 reps;10 seconds    Star Gazer Stretch 5 reps;10 seconds      Manual Therapy   Manual Therapy Passive ROM    Manual therapy comments flexion, abduction, IR/ER to pt tolerance                     PT Education - 10/24/21 1328     Education Details Suggested patient focus on ROM until next visit.  Revised HEP to include AAROM    Person(s) Educated Patient    Methods Explanation    Comprehension Verbalized understanding              PT Short Term Goals - 10/12/21 1229       PT SHORT TERM GOAL #1   Title be independent in initial HEP    Status On-going      PT SHORT TERM GOAL #2   Title report < or = to 7/10 Lt shoulder pain with use    Baseline 7-8/10    Status On-going      PT SHORT TERM GOAL #4   Title demonstrate Lt shoulder A/ROM flexion to > or = to 100 degrees for overhead reaching    Baseline 107    Status Achieved               PT Long Term Goals - 10/04/21 1603       PT LONG TERM GOAL #1   Title be independent in advanced HEP    Time 8    Period Weeks    Status New    Target Date 11/29/21      PT LONG TERM GOAL #2   Title improve FOTO to > or = to 61 to improve function    Baseline 34    Time 8  Period Weeks    Status New    Target Date 11/29/21      PT LONG TERM GOAL #3   Title demonstrate > or = to Lt shoulder flexion to > or = to 120 degrees to improve reaching  overhead    Time 8    Period Weeks    Status New    Target Date 11/29/21      PT LONG TERM GOAL #4   Title report > or = to 60% use of Lt UE for daily tasks due to improved functional strength    Time 8    Period Weeks    Status New    Target Date 11/29/21      PT LONG TERM GOAL #5   Title report < or = to 5/10 Lt shoulder pain with reaching and lifting    Time 8    Period Weeks    Status New    Target Date 11/29/21      Additional Long Term Goals   Additional Long Term Goals Yes      PT LONG TERM GOAL #6   Title demonstrate Lt UE ER to T2 to improve self-care    Time 8    Period Weeks    Status New    Target Date 11/29/21                   Plan - 10/24/21 1309     Clinical Impression Statement Patient is making slow but steady progress.  He limits his treatment options at times but is responding to PT.  He was able to tolerate PROM with mod pain.  He is quite limited in IR and ER.  He would benefit from continued skilled therapy to focus on ROM.    Personal Factors and Comorbidities Comorbidity 2    Comorbidities DM, a-fib    Examination-Activity Limitations Sleep;Carry;Lift;Reach Overhead    Examination-Participation Restrictions Laundry;Yard Work    Stability/Clinical Decision Making Stable/Uncomplicated    Clinical Decision Making Moderate    Rehab Potential Good    PT Frequency 2x / week    PT Duration 8 weeks    PT Treatment/Interventions ADLs/Self Care Home Management;Cryotherapy;Electrical Stimulation;Moist Heat;Therapeutic exercise;Therapeutic activities;Functional mobility training;Patient/family education;Manual techniques;Passive range of motion;Dry needling;Taping;Neuromuscular re-education    PT Next Visit Plan Focus on ROM    PT Home Exercise Plan Access Code: [email or web address]             Patient will benefit from skilled therapeutic intervention in order to improve the following deficits and impairments:  Decreased activity tolerance, Decreased  range of motion, Decreased endurance, Decreased strength, Hypomobility, Impaired flexibility, Postural dysfunction, Improper body mechanics, Pain, Increased muscle spasms  Visit Diagnosis: Muscle weakness (generalized)  Stiffness of left shoulder, not elsewhere classified  Chronic left shoulder pain     Problem List Patient Active Problem List   Diagnosis Date Noted   Type 2 diabetes mellitus with hyperglycemia, without long-term current use of insulin ([place]) 04/29/2020   Type 2 diabetes mellitus with stage 3a chronic kidney disease, without long-term current use of insulin ([place]) 04/29/2020   Hypertriglyceridemia 04/29/2020   CHF exacerbation ([place]) 12/25/2018   Carotid artery calcification 09/09/2018   Coronary artery disease involving coronary bypass graft of native heart without angina pectoris 07/22/2017   Acute on chronic systolic (congestive) heart failure ([place]) 06/10/2017   COPD GOLD II with reversibility 04/03/2017   Chronic respiratory failure with hypoxia and hypercapnia ([place]) 04/03/2017   Acute pulmonary  edema (HCC)    Acute on chronic diastolic ACC/AHA stage C congestive heart failure (HCC)    Acute exacerbation of congestive heart failure ([place]) 02/26/2017   Hyperkalemia 02/26/2017   Prolonged QT interval 02/26/2017   Anemia 02/26/2017   Skin abscess 02/13/2017   Dyspnea on exertion 02/12/2017   Enteritis due to Clostridium difficile 01/05/2017   GI bleed 01/03/2017   Symptomatic anemia 01/03/2017   Hypothyroidism, acquired 01/03/2017   Cough    Diarrhea    AVNRT (AV nodal re-entry tachycardia) ([place]) 08/24/2016   Chest pain 05/05/2016   NSTEMI (non-ST elevated myocardial infarction) ([place]) 05/05/2016   SVT (supraventricular tachycardia) ([place]) 03/21/2016   S/P CABG x 3 03/21/2016   Morbid obesity due to excess calories ([place]) complicated by hbp/hyperlipidemia  03/21/2016   Essential hypertension 03/21/2016   Hyperlipidemia LDL goal <70 03/21/2016   Paronychia  03/21/2016   Tobacco dependence 03/21/2016   Pain in right ankle and joints of right foot 03/21/2016   Chronic pain 03/21/2016   OSA (obstructive sleep apnea) 03/21/2016   Medication management 03/21/2016   History of prostate cancer 03/21/2016   Primary insomnia 03/21/2016   Post-traumatic osteoarthritis of right ankle 11/07/2015   Closed displaced fracture of medial malleolus of right tibia 10/24/2015    Anderson Malta B. Martie Fulgham, PT 11/22/221:33 PM   [facility] @ [place], [place], [address] Phone: [phone]   Fax:  [phone]  Name: Jeremiah [name] MRN: [id number] Date of Birth: Apr 27, 1946

## 2021-10-25 ENCOUNTER — Other Ambulatory Visit: Payer: Self-pay | Admitting: Sports Medicine

## 2021-10-25 ENCOUNTER — Ambulatory Visit
Admission: RE | Admit: 2021-10-25 | Discharge: 2021-10-25 | Disposition: A | Discharge status: Home / Self Care | Payer: Federal, State, Local not specified - PPO | Source: Ambulatory Visit | Attending: Sports Medicine | Admitting: Sports Medicine

## 2021-10-25 DIAGNOSIS — M25512 Pain in left shoulder: Secondary | ICD-10-CM | POA: Diagnosis not present

## 2021-10-27 ENCOUNTER — Other Ambulatory Visit: Payer: Self-pay | Admitting: Internal Medicine

## 2021-10-29 DIAGNOSIS — G4733 Obstructive sleep apnea (adult) (pediatric): Secondary | ICD-10-CM | POA: Diagnosis not present

## 2021-10-29 DIAGNOSIS — I5033 Acute on chronic diastolic (congestive) heart failure: Secondary | ICD-10-CM | POA: Diagnosis not present

## 2021-10-31 ENCOUNTER — Other Ambulatory Visit: Payer: Self-pay

## 2021-10-31 ENCOUNTER — Ambulatory Visit: Payer: Federal, State, Local not specified - PPO

## 2021-10-31 DIAGNOSIS — G8929 Other chronic pain: Secondary | ICD-10-CM | POA: Diagnosis not present

## 2021-10-31 DIAGNOSIS — M6281 Muscle weakness (generalized): Secondary | ICD-10-CM | POA: Diagnosis not present

## 2021-10-31 DIAGNOSIS — M25612 Stiffness of left shoulder, not elsewhere classified: Secondary | ICD-10-CM

## 2021-10-31 DIAGNOSIS — M25512 Pain in left shoulder: Secondary | ICD-10-CM | POA: Diagnosis not present

## 2021-10-31 NOTE — Patient Instructions (Signed)
Reviewed AAROM exercises added last visit.  VC's for correct technique.

## 2021-10-31 NOTE — Therapy (Signed)
[facility] @ [place], [place], [address] Phone: [phone]   Fax:  [phone]  Physical Therapy Treatment  Patient Details  Name: Jeremiah [name] MRN: [id number] Date of Birth: 09/06/1946 Referring Provider (PT): Dorthy Cooler, Dibas/Alexander, Marylyn Ishihara, MD   Encounter Date: 10/31/2021   PT End of Session - 10/31/21 1240     Visit Number 6    Date for PT Re-Evaluation 11/29/21    Authorization Type [employer] Federal-53 remaining for year    Authorization - Visit Number 6    Authorization - Number of Visits 53    Progress Note Due on Visit 10    PT Start Time 2376    PT Stop Time 1308    PT Time Calculation (min) 38 min    Activity Tolerance Patient tolerated treatment well    Behavior During Therapy [facility] for tasks assessed/performed             Past Medical History:  Diagnosis Date   A-fib ([place])    Anemia    Aortic valve sclerosis 12/2018   Noted on ECHO   Arthritis    CAD (coronary artery disease)    a. 2006: CABG in 2006 with LIMA-LAD, SVG-OM1, and SVG-RPDA   Cardiomegaly 12/2018   Stable, noted on CXR   Carpal tunnel syndrome    Right   Chronic pain 03/21/2016   Colon cancer ([place]) 2006   COPD (chronic obstructive pulmonary disease) ([place])    Diabetes mellitus without complication ([place])    DVT (deep venous thrombosis) ([place])    Right   Essential hypertension 03/21/2016   GERD (gastroesophageal reflux disease)    History of blood transfusion    History of Clostridioides difficile infection    History of prostate cancer 03/21/2016   History of PSVT (paroxysmal supraventricular tachycardia)    HLD (hyperlipidemia)    HTN (hypertension)    Hx of CABG 2006   Hypercholesteremia 03/21/2016   Hypothyroidism    LAE (left atrial enlargement) 12/2018   Severe, Noted on ECHO   LVH (left ventricular hypertrophy) 12/2018   Mild, Noted on ECHO   Medication management 03/21/2016   Mitral annular calcification 12/2018    with mild MS, Noted on ECHO   Morbid obesity ([place]) 03/21/2016   Myocardial infarct (HCC)    OSA (obstructive sleep apnea) 03/21/2016   uses oxygen at night time   Pain in right ankle and joints of right foot 03/21/2016   Paronychia 03/21/2016   Phimosis    Pneumonia    Primary insomnia 03/21/2016   Prostate cancer ([place]) 2008   Pulmonary hypertension ([place]) 12/2018   Moderate, Noted on ECHO   Tobacco dependence 03/21/2016   Tricuspid regurgitation 12/2018   Mild, Noted on ECHO    Past Surgical History:  Procedure Laterality Date   ANKLE SURGERY Right 12/2013   CARDIAC CATHETERIZATION N/A 05/08/2016   Procedure: Left Heart Cath and Cors/Grafts Angiography;  Surgeon: Belva Crome, MD;  Location: [place] [facility];  Service: Cardiovascular;  Laterality: N/A;   CIRCUMCISION N/A 10/07/2019   Procedure: CIRCUMCISION ADULT;  Surgeon: Ardis Hughs, MD;  [facility];  Service: Urology;  Laterality: N/A;   CORONARY ARTERY BYPASS GRAFT  2006   x3   ELECTROPHYSIOLOGIC STUDY N/A 08/24/2016   Procedure: SVT Ablation;  Surgeon: Will Meredith Leeds, MD;  Location: [place];  Service: Cardiovascular;  Laterality: N/A;   PROSTATE SURGERY  2008   PTCA  There were no vitals filed for this visit.   Subjective Assessment - 10/31/21 1232     Subjective Patient states he is overall a little better.  He states his pain is primarily first thing in the morning.    Pertinent History a-fib, CAD, colon cancer, DM, MI, sleep apnea, Rt tennis elbow.    Diagnostic tests Dr Sheppard Coil: diagnostic US- rotator cuff tear on Lt    Patient Stated Goals reduce Lt shoulder pain with use, improve Lt shoulder strength, A/ROM and use    Currently in Pain? No/denies    Pain Onset 1 to 4 weeks ago                               [facility] Adult PT Treatment/Exercise - 10/31/21 0001       Neck Exercises: Machines for Strengthening   UBE (Upper Arm Bike) 6 min (3/3) seat 10 Level 1- PT  present to disucss progress      Shoulder Exercises: Standing   Flexion Strengthening;Both;20 reps;Weights    Shoulder Flexion Weight (lbs) 2 lbs    Flexion Limitations using finger ladder    ABduction Strengthening;Both;20 reps;Weights    Shoulder ABduction Weight (lbs) 2 lbs      Shoulder Exercises: Pulleys   Flexion 2 minutes    Scaption 2 minutes      Shoulder Exercises: Stretch   Corner Stretch 5 reps;10 seconds    Corner Stretch Limitations done in Surveyor, quantity 10 seconds    Internal Rotation Stretch Limitations towel stretch    External Rotation Stretch 5 reps;10 seconds    Star Gazer Stretch 5 reps;10 seconds   standing                    PT Education - 10/31/21 1239     Education Details Continue current HEP focusing on stretches.              PT Short Term Goals - 10/12/21 1229       PT SHORT TERM GOAL #1   Title be independent in initial HEP    Status On-going      PT SHORT TERM GOAL #2   Title report < or = to 7/10 Lt shoulder pain with use    Baseline 7-8/10    Status On-going      PT SHORT TERM GOAL #4   Title demonstrate Lt shoulder A/ROM flexion to > or = to 100 degrees for overhead reaching    Baseline 107    Status Achieved               PT Long Term Goals - 10/04/21 1603       PT LONG TERM GOAL #1   Title be independent in advanced HEP    Time 8    Period Weeks    Status New    Target Date 11/29/21      PT LONG TERM GOAL #2   Title improve FOTO to > or = to 61 to improve function    Baseline 34    Time 8    Period Weeks    Status New    Target Date 11/29/21      PT LONG TERM GOAL #3   Title demonstrate > or = to Lt shoulder flexion to > or = to 120 degrees to improve reaching overhead    Time 8  Period Weeks    Status New    Target Date 11/29/21      PT LONG TERM GOAL #4   Title report > or = to 60% use of Lt UE for daily tasks due to improved functional strength    Time 8     Period Weeks    Status New    Target Date 11/29/21      PT LONG TERM GOAL #5   Title report < or = to 5/10 Lt shoulder pain with reaching and lifting    Time 8    Period Weeks    Status New    Target Date 11/29/21      Additional Long Term Goals   Additional Long Term Goals Yes      PT LONG TERM GOAL #6   Title demonstrate Lt UE ER to T2 to improve self-care    Time 8    Period Weeks    Status New    Target Date 11/29/21                   Plan - 10/31/21 1242     Clinical Impression Statement Patient continues to demonstrate steady improvement.  He was able to do all tasks today with minimal complaints of pain.  He is able to get into star gazer position without wincing. He is able to do finger ladder to top rung in flexion and abduction.  He would benefit from continued skilled PT for Left shoulder ROM and strengthening to restore proper glenohumeral mechanics to allow increased ease with ADL's and IADL's.    Personal Factors and Comorbidities Comorbidity 2    Comorbidities DM, a-fib    Examination-Activity Limitations Sleep;Carry;Lift;Reach Overhead    Examination-Participation Restrictions Laundry;Yard Work    Stability/Clinical Decision Making Stable/Uncomplicated    Clinical Decision Making Moderate    Rehab Potential Good    PT Frequency 2x / week    PT Duration 8 weeks    PT Treatment/Interventions ADLs/Self Care Home Management;Cryotherapy;Electrical Stimulation;Moist Heat;Therapeutic exercise;Therapeutic activities;Functional mobility training;Patient/family education;Manual techniques;Passive range of motion;Dry needling;Taping;Neuromuscular re-education    PT Next Visit Plan Focus on ROM    PT Home Exercise Plan Access Code: [email or web address]    Consulted and Agree with Plan of Care Patient             Patient will benefit from skilled therapeutic intervention in order to improve the following deficits and impairments:  Decreased activity tolerance, Decreased  range of motion, Decreased endurance, Decreased strength, Hypomobility, Impaired flexibility, Postural dysfunction, Improper body mechanics, Pain, Increased muscle spasms  Visit Diagnosis: Muscle weakness (generalized)  Stiffness of left shoulder, not elsewhere classified  Chronic left shoulder pain     Problem List Patient Active Problem List   Diagnosis Date Noted   Type 2 diabetes mellitus with hyperglycemia, without long-term current use of insulin ([place]) 04/29/2020   Type 2 diabetes mellitus with stage 3a chronic kidney disease, without long-term current use of insulin ([place]) 04/29/2020   Hypertriglyceridemia 04/29/2020   CHF exacerbation ([place]) 12/25/2018   Carotid artery calcification 09/09/2018   Coronary artery disease involving coronary bypass graft of native heart without angina pectoris 07/22/2017   Acute on chronic systolic (congestive) heart failure ([place]) 06/10/2017   COPD GOLD II with reversibility 04/03/2017   Chronic respiratory failure with hypoxia and hypercapnia ([place]) 04/03/2017   Acute pulmonary edema (HCC)    Acute on chronic diastolic ACC/AHA stage C congestive heart failure ([place])    Acute  exacerbation of congestive heart failure ([place]) 02/26/2017   Hyperkalemia 02/26/2017   Prolonged QT interval 02/26/2017   Anemia 02/26/2017   Skin abscess 02/13/2017   Dyspnea on exertion 02/12/2017   Enteritis due to Clostridium difficile 01/05/2017   GI bleed 01/03/2017   Symptomatic anemia 01/03/2017   Hypothyroidism, acquired 01/03/2017   Cough    Diarrhea    AVNRT (AV nodal re-entry tachycardia) ([place]) 08/24/2016   Chest pain 05/05/2016   NSTEMI (non-ST elevated myocardial infarction) ([place]) 05/05/2016   SVT (supraventricular tachycardia) ([place]) 03/21/2016   S/P CABG x 3 03/21/2016   Morbid obesity due to excess calories ([place]) complicated by hbp/hyperlipidemia  03/21/2016   Essential hypertension 03/21/2016   Hyperlipidemia LDL goal <70 03/21/2016   Paronychia  03/21/2016   Tobacco dependence 03/21/2016   Pain in right ankle and joints of right foot 03/21/2016   Chronic pain 03/21/2016   OSA (obstructive sleep apnea) 03/21/2016   Medication management 03/21/2016   History of prostate cancer 03/21/2016   Primary insomnia 03/21/2016   Post-traumatic osteoarthritis of right ankle 11/07/2015   Closed displaced fracture of medial malleolus of right tibia 10/24/2015    Anderson Malta B. Devean Skoczylas, PT 11/29/221:11 PM   [facility] @ [place], [place], [address] Phone: [phone]   Fax:  [phone]  Name: Chapin Arduini MRN: [id number] Date of Birth: 02-04-1946

## 2021-11-01 ENCOUNTER — Other Ambulatory Visit: Payer: Self-pay | Admitting: Sports Medicine

## 2021-11-01 DIAGNOSIS — M25512 Pain in left shoulder: Secondary | ICD-10-CM

## 2021-11-02 ENCOUNTER — Ambulatory Visit: Payer: Federal, State, Local not specified - PPO | Attending: Family Medicine

## 2021-11-02 ENCOUNTER — Other Ambulatory Visit: Payer: Self-pay

## 2021-11-02 DIAGNOSIS — G8929 Other chronic pain: Secondary | ICD-10-CM | POA: Insufficient documentation

## 2021-11-02 DIAGNOSIS — M25612 Stiffness of left shoulder, not elsewhere classified: Secondary | ICD-10-CM | POA: Insufficient documentation

## 2021-11-02 DIAGNOSIS — M25512 Pain in left shoulder: Secondary | ICD-10-CM | POA: Diagnosis not present

## 2021-11-02 DIAGNOSIS — M6281 Muscle weakness (generalized): Secondary | ICD-10-CM | POA: Diagnosis not present

## 2021-11-02 NOTE — Therapy (Signed)
[facility] @ [address] [place], [place], [address] Phone: [phone]   Fax:  [phone]  Physical Therapy Treatment  Patient Details  Name: Jeremiah [name] MRN: [id number] Date of Birth: 07-Nov-1946 Referring Provider (PT): Dorthy Cooler, Dibas/Alexander, Marylyn Ishihara, MD   Encounter Date: 11/02/2021   PT End of Session - 11/02/21 1312     Visit Number 7    Date for PT Re-Evaluation 11/29/21    Authorization Type [employer] Federal-53 remaining for year    Authorization - Visit Number 7    Authorization - Number of Visits 53    Progress Note Due on Visit 10    PT Start Time 6237    PT Stop Time 1314    PT Time Calculation (min) 39 min    Activity Tolerance Patient tolerated treatment well    Behavior During Therapy [facility] for tasks assessed/performed             Past Medical History:  Diagnosis Date   A-fib ([place])    Anemia    Aortic valve sclerosis 12/2018   Noted on ECHO   Arthritis    CAD (coronary artery disease)    a. 2006: CABG in 2006 with LIMA-LAD, SVG-OM1, and SVG-RPDA   Cardiomegaly 12/2018   Stable, noted on CXR   Carpal tunnel syndrome    Right   Chronic pain 03/21/2016   Colon cancer ([place]) 2006   COPD (chronic obstructive pulmonary disease) ([place])    Diabetes mellitus without complication ([place])    DVT (deep venous thrombosis) ([place])    Right   Essential hypertension 03/21/2016   GERD (gastroesophageal reflux disease)    History of blood transfusion    History of Clostridioides difficile infection    History of prostate cancer 03/21/2016   History of PSVT (paroxysmal supraventricular tachycardia)    HLD (hyperlipidemia)    HTN (hypertension)    Hx of CABG 2006   Hypercholesteremia 03/21/2016   Hypothyroidism    LAE (left atrial enlargement) 12/2018   Severe, Noted on ECHO   LVH (left ventricular hypertrophy) 12/2018   Mild, Noted on ECHO   Medication management 03/21/2016   Mitral annular calcification 12/2018    with mild MS, Noted on ECHO   Morbid obesity ([place]) 03/21/2016   Myocardial infarct (HCC)    OSA (obstructive sleep apnea) 03/21/2016   uses oxygen at night time   Pain in right ankle and joints of right foot 03/21/2016   Paronychia 03/21/2016   Phimosis    Pneumonia    Primary insomnia 03/21/2016   Prostate cancer ([place]) 2008   Pulmonary hypertension ([place]) 12/2018   Moderate, Noted on ECHO   Tobacco dependence 03/21/2016   Tricuspid regurgitation 12/2018   Mild, Noted on ECHO    Past Surgical History:  Procedure Laterality Date   ANKLE SURGERY Right 12/2013   CARDIAC CATHETERIZATION N/A 05/08/2016   Procedure: Left Heart Cath and Cors/Grafts Angiography;  Surgeon: Belva Crome, MD;  Location: [facility];  Service: Cardiovascular;  Laterality: N/A;   CIRCUMCISION N/A 10/07/2019   Procedure: CIRCUMCISION ADULT;  Surgeon: Ardis Hughs, MD;  Location: [facility];  Service: Urology;  Laterality: N/A;   CORONARY ARTERY BYPASS GRAFT  2006   x3   ELECTROPHYSIOLOGIC STUDY N/A 08/24/2016   Procedure: SVT Ablation;  Surgeon: Will Meredith Leeds, MD;  Location: [facility];  Service: Cardiovascular;  Laterality: N/A;   PROSTATE SURGERY  2008   PTCA  There were no vitals filed for this visit.   Subjective Assessment - 11/02/21 1239     Subjective I am feeling good today.    Currently in Pain? No/denies                [facility] PT Assessment - 11/02/21 0001       AROM   Left Shoulder Flexion 110 Degrees                           [facility] Adult PT Treatment/Exercise - 11/02/21 0001       Neck Exercises: Machines for Strengthening   UBE (Upper Arm Bike) 6 min (3/3) seat 10 Level 1- PT present to disucss progress      Shoulder Exercises: Seated   Flexion Strengthening;Left;20 reps;Weights    Flexion Weight (lbs) 2    Diagonals Strengthening;20 reps;Weights    Diagonals Weight (lbs) 2      Shoulder Exercises: Standing   Flexion  Strengthening;Both;20 reps;Weights    Shoulder Flexion Weight (lbs) 2 lbs    Flexion Limitations using finger ladder    ABduction Strengthening;Both;20 reps;Weights    Shoulder ABduction Weight (lbs) 2 lbs    Other Standing Exercises cone stack to shelf over sink x 3 min      Shoulder Exercises: Pulleys   Other Pulley Exercises IR x 3 min standing                       PT Short Term Goals - 11/02/21 1303       PT SHORT TERM GOAL #1   Title be independent in initial HEP    Status Achieved      PT SHORT TERM GOAL #2   Title report < or = to 7/10 Lt shoulder pain with use    Baseline 6-7/10 max and resolves quickly    Status On-going      PT SHORT TERM GOAL #3   Title report > or = to 40% use of Lt UE with functional tasks    Baseline 40%    Status Achieved      PT SHORT TERM GOAL #4   Title demonstrate Lt shoulder A/ROM flexion to > or = to 100 degrees for overhead reaching    Baseline 110    Status Achieved               PT Long Term Goals - 10/04/21 1603       PT LONG TERM GOAL #1   Title be independent in advanced HEP    Time 8    Period Weeks    Status New    Target Date 11/29/21      PT LONG TERM GOAL #2   Title improve FOTO to > or = to 61 to improve function    Baseline 34    Time 8    Period Weeks    Status New    Target Date 11/29/21      PT LONG TERM GOAL #3   Title demonstrate > or = to Lt shoulder flexion to > or = to 120 degrees to improve reaching overhead    Time 8    Period Weeks    Status New    Target Date 11/29/21      PT LONG TERM GOAL #4   Title report > or = to 60% use of Lt UE for daily tasks due to improved functional  strength    Time 8    Period Weeks    Status New    Target Date 11/29/21      PT LONG TERM GOAL #5   Title report < or = to 5/10 Lt shoulder pain with reaching and lifting    Time 8    Period Weeks    Status New    Target Date 11/29/21      Additional Long Term Goals   Additional Long  Term Goals Yes      PT LONG TERM GOAL #6   Title demonstrate Lt UE ER to T2 to improve self-care    Time 8    Period Weeks    Status New    Target Date 11/29/21                   Plan - 11/02/21 1309     Clinical Impression Statement Patient continues to demonstrate steady improvement.  He was able to do all tasks today with minimal complaints of pain.  Pt with improved endurance and able to perform flexion and scaption with 2# without scapular substitution.  He is able to do finger ladder to top rung in flexion and abduction with additional weight today.  Lt shoulder flexion is 110 degrees and pt reports 40% overall improvement in symptoms since the start of care. He would benefit from continued skilled PT for Left shoulder ROM and strengthening to restore proper glenohumeral mechanics to allow increased ease with ADL's and IADL's.    PT Frequency 2x / week    PT Duration 8 weeks    PT Treatment/Interventions ADLs/Self Care Home Management;Cryotherapy;Electrical Stimulation;Moist Heat;Therapeutic exercise;Therapeutic activities;Functional mobility training;Patient/family education;Manual techniques;Passive range of motion;Dry needling;Taping;Neuromuscular re-education    PT Next Visit Plan Lt shoulder strength, flexibility and mobility    PT Home Exercise Plan Access Code: [email or web address]    Consulted and Agree with Plan of Care Patient             Patient will benefit from skilled therapeutic intervention in order to improve the following deficits and impairments:  Decreased activity tolerance, Decreased range of motion, Decreased endurance, Decreased strength, Hypomobility, Impaired flexibility, Postural dysfunction, Improper body mechanics, Pain, Increased muscle spasms  Visit Diagnosis: Muscle weakness (generalized)  Stiffness of left shoulder, not elsewhere classified  Chronic left shoulder pain     Problem List Patient Active Problem List   Diagnosis Date Noted    Type 2 diabetes mellitus with hyperglycemia, without long-term current use of insulin ([place]) 04/29/2020   Type 2 diabetes mellitus with stage 3a chronic kidney disease, without long-term current use of insulin ([place]) 04/29/2020   Hypertriglyceridemia 04/29/2020   CHF exacerbation ([place]) 12/25/2018   Carotid artery calcification 09/09/2018   Coronary artery disease involving coronary bypass graft of native heart without angina pectoris 07/22/2017   Acute on chronic systolic (congestive) heart failure ([place]) 06/10/2017   COPD GOLD II with reversibility 04/03/2017   Chronic respiratory failure with hypoxia and hypercapnia (HCC) 04/03/2017   Acute pulmonary edema (HCC)    Acute on chronic diastolic ACC/AHA stage C congestive heart failure (HCC)    Acute exacerbation of congestive heart failure ([place]) 02/26/2017   Hyperkalemia 02/26/2017   Prolonged QT interval 02/26/2017   Anemia 02/26/2017   Skin abscess 02/13/2017   Dyspnea on exertion 02/12/2017   Enteritis due to Clostridium difficile 01/05/2017   GI bleed 01/03/2017   Symptomatic anemia 01/03/2017   Hypothyroidism, acquired 01/03/2017   Cough  Diarrhea    AVNRT (AV nodal re-entry tachycardia) ([place]) 08/24/2016   Chest pain 05/05/2016   NSTEMI (non-ST elevated myocardial infarction) ([place]) 05/05/2016   SVT (supraventricular tachycardia) ([place]) 03/21/2016   S/P CABG x 3 03/21/2016   Morbid obesity due to excess calories ([place]) complicated by hbp/hyperlipidemia  03/21/2016   Essential hypertension 03/21/2016   Hyperlipidemia LDL goal <70 03/21/2016   Paronychia 03/21/2016   Tobacco dependence 03/21/2016   Pain in right ankle and joints of right foot 03/21/2016   Chronic pain 03/21/2016   OSA (obstructive sleep apnea) 03/21/2016   Medication management 03/21/2016   History of prostate cancer 03/21/2016   Primary insomnia 03/21/2016   Post-traumatic osteoarthritis of right ankle 11/07/2015   Closed displaced fracture of medial malleolus  of right tibia 10/24/2015    Sigurd Sos, PT 11/02/21 1:15 PM   [facility] @ [address] [place], [place], [address] Phone: [phone]   Fax:  [phone]  Name: Jeremiah [name] MRN: [id number] Date of Birth: 04-May-1946

## 2021-11-06 ENCOUNTER — Other Ambulatory Visit: Payer: Self-pay | Admitting: Internal Medicine

## 2021-11-06 DIAGNOSIS — G4733 Obstructive sleep apnea (adult) (pediatric): Secondary | ICD-10-CM | POA: Diagnosis not present

## 2021-11-07 NOTE — Telephone Encounter (Signed)
arformoterol (BROVANA) 15 MCG/2ML NEBU       Changed from: PERFOROMIST 20 MCG/2ML nebulizer solution   Sig: N/A   Disp:  120 mL    Refills:  11   Start: 11/06/2021   Class: Normal   Non-formulary   Last ordered: 2 months ago by Tanda Rockers, MD    Rx #: [id number]   Pharmacy comment: Patients insurance is no longer covering Perforomist, must be switched to Brovana/Arformoterol.  If you could please send new prescription at earliest convenience it would be most appreciated. Thank you!

## 2021-11-14 ENCOUNTER — Telehealth: Payer: Self-pay | Admitting: Internal Medicine

## 2021-11-14 MED ORDER — EMPAGLIFLOZIN 25 MG PO TABS: 25.0000 mg | ORAL_TABLET | Freq: Every day | ORAL | 6 refills | Status: DC

## 2021-11-14 NOTE — Telephone Encounter (Signed)
Refill sent to pharmacy.   

## 2021-11-14 NOTE — Telephone Encounter (Signed)
°*  STAT* If patient is at the pharmacy, call can be transferred to refill team.   1. Which medications need to be refilled? (please list name of each medication and dose if known) empagliflozin (JARDIANCE) 25 MG TABS tablet  2. Which pharmacy/location (including street and city if local pharmacy) is medication to be sent to? [facility] [place], [place] AT [address]  3. Do they need a 30 day or 90 day supply? [place]

## 2021-11-21 ENCOUNTER — Other Ambulatory Visit: Payer: Self-pay

## 2021-11-21 ENCOUNTER — Ambulatory Visit: Payer: Federal, State, Local not specified - PPO

## 2021-11-21 DIAGNOSIS — M25512 Pain in left shoulder: Secondary | ICD-10-CM | POA: Diagnosis not present

## 2021-11-21 DIAGNOSIS — G8929 Other chronic pain: Secondary | ICD-10-CM | POA: Diagnosis not present

## 2021-11-21 DIAGNOSIS — M6281 Muscle weakness (generalized): Secondary | ICD-10-CM | POA: Diagnosis not present

## 2021-11-21 DIAGNOSIS — M25612 Stiffness of left shoulder, not elsewhere classified: Secondary | ICD-10-CM

## 2021-11-21 NOTE — Therapy (Signed)
[facility] @ [address] [place], [place], [address] Phone: [phone]   Fax:  [phone]  Physical Therapy Treatment  Patient Details  Name: Jeremiah [name] MRN: [id number] Date of Birth: 29-Nov-1946 Referring Provider (PT): Dorthy Cooler, Dibas/Alexander, Marylyn Ishihara, MD   Encounter Date: 11/21/2021   PT End of Session - 11/21/21 1548     Visit Number 8    Date for PT Re-Evaluation 11/29/21    Authorization Type [employer] Federal-53 remaining for year    Authorization - Visit Number 8    Authorization - Number of Visits 53    Progress Note Due on Visit 10    PT Start Time 1542    PT Stop Time 1619    PT Time Calculation (min) 37 min    Activity Tolerance Patient tolerated treatment well    Behavior During Therapy [facility] for tasks assessed/performed             Past Medical History:  Diagnosis Date   A-fib ([place])    Anemia    Aortic valve sclerosis 12/2018   Noted on ECHO   Arthritis    CAD (coronary artery disease)    a. 2006: CABG in 2006 with LIMA-LAD, SVG-OM1, and SVG-RPDA   Cardiomegaly 12/2018   Stable, noted on CXR   Carpal tunnel syndrome    Right   Chronic pain 03/21/2016   Colon cancer ([place]) 2006   COPD (chronic obstructive pulmonary disease) ([place])    Diabetes mellitus without complication ([place])    DVT (deep venous thrombosis) ([place])    Right   Essential hypertension 03/21/2016   GERD (gastroesophageal reflux disease)    History of blood transfusion    History of Clostridioides difficile infection    History of prostate cancer 03/21/2016   History of PSVT (paroxysmal supraventricular tachycardia)    HLD (hyperlipidemia)    HTN (hypertension)    Hx of CABG 2006   Hypercholesteremia 03/21/2016   Hypothyroidism    LAE (left atrial enlargement) 12/2018   Severe, Noted on ECHO   LVH (left ventricular hypertrophy) 12/2018   Mild, Noted on ECHO   Medication management 03/21/2016   Mitral annular calcification 12/2018    with mild MS, Noted on ECHO   Morbid obesity ([place]) 03/21/2016   Myocardial infarct (HCC)    OSA (obstructive sleep apnea) 03/21/2016   uses oxygen at night time   Pain in right ankle and joints of right foot 03/21/2016   Paronychia 03/21/2016   Phimosis    Pneumonia    Primary insomnia 03/21/2016   Prostate cancer ([place]) 2008   Pulmonary hypertension ([place]) 12/2018   Moderate, Noted on ECHO   Tobacco dependence 03/21/2016   Tricuspid regurgitation 12/2018   Mild, Noted on ECHO    Past Surgical History:  Procedure Laterality Date   ANKLE SURGERY Right 12/2013   CARDIAC CATHETERIZATION N/A 05/08/2016   Procedure: Left Heart Cath and Cors/Grafts Angiography;  Surgeon: Belva Crome, MD;  Location: [facility];  Service: Cardiovascular;  Laterality: N/A;   CIRCUMCISION N/A 10/07/2019   Procedure: CIRCUMCISION ADULT;  Surgeon: Ardis Hughs, MD;  Location: [facility];  Service: Urology;  Laterality: N/A;   CORONARY ARTERY BYPASS GRAFT  2006   x3   ELECTROPHYSIOLOGIC STUDY N/A 08/24/2016   Procedure: SVT Ablation;  Surgeon: Will Meredith Leeds, MD;  Location: [facility];  Service: Cardiovascular;  Laterality: N/A;   PROSTATE SURGERY  2008   PTCA  There were no vitals filed for this visit.   Subjective Assessment - 11/21/21 1544     Subjective The shoulder is feeling good so "I'm doing stupid things"  like lifting 150 lb box out of my vehicle.  He has MRI scheduled for December 27th.    Pertinent History a-fib, CAD, colon cancer, DM, MI, sleep apnea, Rt tennis elbow.    Diagnostic tests Dr Sheppard Coil: diagnostic US- rotator cuff tear on Lt    Patient Stated Goals reduce Lt shoulder pain with use, improve Lt shoulder strength, A/ROM and use    Currently in Pain? No/denies    Pain Score 0-No pain    Pain Onset 1 to 4 weeks ago                               [facility] Adult PT Treatment/Exercise - 11/21/21 0001       Neck Exercises: Machines for  Strengthening   UBE (Upper Arm Bike) 6 min (3/3) seat 10 Level 1- PT present to disucss progress      Shoulder Exercises: Standing   Flexion Strengthening;Both;20 reps;Weights    Shoulder Flexion Weight (lbs) 3 lbs    ABduction Strengthening;Both;20 reps;Weights    Shoulder ABduction Weight (lbs) 3 lb    Extension Strengthening;Left;20 reps    Extension Weight (lbs) 3 lb    Row Strengthening;Left;20 reps    Row Weight (lbs) 3lb    Other Standing Exercises 3 way tband for scapular stabilization on wall left x 10      Shoulder Exercises: Therapy Ball   Other Therapy Ball Exercises 4 way ball rolls x 20 each, red ball      Shoulder Exercises: ROM/Strengthening   Ball on Wall 4 way scapular stabilizaton with red ball      Shoulder Exercises: Stretch   Corner Stretch 5 reps;10 seconds    Corner Stretch Limitations done in Ship [employer] Stretch 10 seconds    Internal Rotation Stretch Limitations towel stretch    External Rotation Stretch 5 reps;10 seconds      Shoulder Exercises: Body Blade   Flexion 30 seconds;2 reps   attempted but patient could not get non dominant arm to get the body blade going                      PT Short Term Goals - 11/02/21 1303       PT SHORT TERM GOAL #1   Title be independent in initial HEP    Status Achieved      PT SHORT TERM GOAL #2   Title report < or = to 7/10 Lt shoulder pain with use    Baseline 6-7/10 max and resolves quickly    Status On-going      PT SHORT TERM GOAL #3   Title report > or = to 40% use of Lt UE with functional tasks    Baseline 40%    Status Achieved      PT SHORT TERM GOAL #4   Title demonstrate Lt shoulder A/ROM flexion to > or = to 100 degrees for overhead reaching    Baseline 110    Status Achieved               PT Long Term Goals - 10/04/21 1603       PT LONG TERM GOAL #1   Title be independent in advanced HEP  Time 8    Period Weeks    Status New    Target Date  11/29/21      PT LONG TERM GOAL #2   Title improve FOTO to > or = to 61 to improve function    Baseline 34    Time 8    Period Weeks    Status New    Target Date 11/29/21      PT LONG TERM GOAL #3   Title demonstrate > or = to Lt shoulder flexion to > or = to 120 degrees to improve reaching overhead    Time 8    Period Weeks    Status New    Target Date 11/29/21      PT LONG TERM GOAL #4   Title report > or = to 60% use of Lt UE for daily tasks due to improved functional strength    Time 8    Period Weeks    Status New    Target Date 11/29/21      PT LONG TERM GOAL #5   Title report < or = to 5/10 Lt shoulder pain with reaching and lifting    Time 8    Period Weeks    Status New    Target Date 11/29/21      Additional Long Term Goals   Additional Long Term Goals Yes      PT LONG TERM GOAL #6   Title demonstrate Lt UE ER to T2 to improve self-care    Time 8    Period Weeks    Status New    Target Date 11/29/21                   Plan - 11/21/21 1609     Clinical Impression Statement Patient is making steady progess and seems to feel well enough to do tasks such as lifting heavy boxes.  He admits he was a little sore but felt strong enough to do this and is doing more around the house.  He is still limited in IR but was able to do increased resistance on shoulder flexion and scaption.  He would benefit from continued skilled PT for ROM and shoulder stability.  He has an MRI scheduled for next week.    Personal Factors and Comorbidities Comorbidity 2    Comorbidities DM, a-fib    Examination-Activity Limitations Sleep;Carry;Lift;Reach Overhead    Examination-Participation Restrictions Laundry;Yard Work    Stability/Clinical Decision Making Stable/Uncomplicated    Clinical Decision Making Moderate    Rehab Potential Good    PT Frequency 2x / week    PT Duration 8 weeks    PT Treatment/Interventions ADLs/Self Care Home Management;Cryotherapy;Electrical  Stimulation;Moist Heat;Therapeutic exercise;Therapeutic activities;Functional mobility training;Patient/family education;Manual techniques;Passive range of motion;Dry needling;Taping;Neuromuscular re-education    PT Next Visit Plan Lt shoulder strength, flexibility and mobility, discuss results of MRI    PT Home Exercise Plan Access Code: [email or web address]    Consulted and Agree with Plan of Care Patient             Patient will benefit from skilled therapeutic intervention in order to improve the following deficits and impairments:  Decreased activity tolerance, Decreased range of motion, Decreased endurance, Decreased strength, Hypomobility, Impaired flexibility, Postural dysfunction, Improper body mechanics, Pain, Increased muscle spasms  Visit Diagnosis: Muscle weakness (generalized)  Stiffness of left shoulder, not elsewhere classified  Chronic left shoulder pain     Problem List Patient Active Problem List   Diagnosis  Date Noted   Type 2 diabetes mellitus with hyperglycemia, without long-term current use of insulin ([place]) 04/29/2020   Type 2 diabetes mellitus with stage 3a chronic kidney disease, without long-term current use of insulin ([place]) 04/29/2020   Hypertriglyceridemia 04/29/2020   CHF exacerbation ([place]) 12/25/2018   Carotid artery calcification 09/09/2018   Coronary artery disease involving coronary bypass graft of native heart without angina pectoris 07/22/2017   Acute on chronic systolic (congestive) heart failure ([place]) 06/10/2017   COPD GOLD II with reversibility 04/03/2017   Chronic respiratory failure with hypoxia and hypercapnia ([place]) 04/03/2017   Acute pulmonary edema (HCC)    Acute on chronic diastolic ACC/AHA stage C congestive heart failure (HCC)    Acute exacerbation of congestive heart failure ([place]) 02/26/2017   Hyperkalemia 02/26/2017   Prolonged QT interval 02/26/2017   Anemia 02/26/2017   Skin abscess 02/13/2017   Dyspnea on exertion 02/12/2017    Enteritis due to Clostridium difficile 01/05/2017   GI bleed 01/03/2017   Symptomatic anemia 01/03/2017   Hypothyroidism, acquired 01/03/2017   Cough    Diarrhea    AVNRT (AV nodal re-entry tachycardia) ([place]) 08/24/2016   Chest pain 05/05/2016   NSTEMI (non-ST elevated myocardial infarction) ([place]) 05/05/2016   SVT (supraventricular tachycardia) ([place]) 03/21/2016   S/P CABG x 3 03/21/2016   Morbid obesity due to excess calories ([place]) complicated by hbp/hyperlipidemia  03/21/2016   Essential hypertension 03/21/2016   Hyperlipidemia LDL goal <70 03/21/2016   Paronychia 03/21/2016   Tobacco dependence 03/21/2016   Pain in right ankle and joints of right foot 03/21/2016   Chronic pain 03/21/2016   OSA (obstructive sleep apnea) 03/21/2016   Medication management 03/21/2016   History of prostate cancer 03/21/2016   Primary insomnia 03/21/2016   Post-traumatic osteoarthritis of right ankle 11/07/2015   Closed displaced fracture of medial malleolus of right tibia 10/24/2015    Anderson [place] B. Hulda Reddix, PT 12/20/224:31 PM   [facility] @ [address] [place], [place], [address] Phone: [phone]   Fax:  [phone]  Name: Jeremiah [name] MRN: [id number] Date of Birth: March 05, 1946

## 2021-11-23 ENCOUNTER — Other Ambulatory Visit: Payer: Self-pay

## 2021-11-23 ENCOUNTER — Ambulatory Visit: Payer: Federal, State, Local not specified - PPO

## 2021-11-23 DIAGNOSIS — G8929 Other chronic pain: Secondary | ICD-10-CM | POA: Diagnosis not present

## 2021-11-23 DIAGNOSIS — M25512 Pain in left shoulder: Secondary | ICD-10-CM

## 2021-11-23 DIAGNOSIS — M25612 Stiffness of left shoulder, not elsewhere classified: Secondary | ICD-10-CM

## 2021-11-23 DIAGNOSIS — M6281 Muscle weakness (generalized): Secondary | ICD-10-CM

## 2021-11-23 NOTE — Therapy (Signed)
[facility] @ [place], [place], [address] Phone: [phone]   Fax:  [phone]  Physical Therapy Treatment  Patient Details  Name: Jeremiah [name] MRN: [id number] Date of Birth: 1946/08/09 Referring Provider (PT): Dorthy Cooler, Dibas/Alexander, Marylyn Ishihara, MD   Encounter Date: 11/23/2021   PT End of Session - 11/23/21 1311     Visit Number 9    Date for PT Re-Evaluation 11/29/21    Authorization Type [employer] Federal-53 remaining for year    Authorization - Visit Number 9    Authorization - Number of Visits 53    Progress Note Due on Visit 10    PT Start Time 5277    PT Stop Time 1312    PT Time Calculation (min) 37 min    Activity Tolerance Patient tolerated treatment well    Behavior During Therapy [facility] for tasks assessed/performed             Past Medical History:  Diagnosis Date   A-fib ([place])    Anemia    Aortic valve sclerosis 12/2018   Noted on ECHO   Arthritis    CAD (coronary artery disease)    a. 2006: CABG in 2006 with LIMA-LAD, SVG-OM1, and SVG-RPDA   Cardiomegaly 12/2018   Stable, noted on CXR   Carpal tunnel syndrome    Right   Chronic pain 03/21/2016   Colon cancer ([place]) 2006   COPD (chronic obstructive pulmonary disease) ([place])    Diabetes mellitus without complication ([place])    DVT (deep venous thrombosis) ([place])    Right   Essential hypertension 03/21/2016   GERD (gastroesophageal reflux disease)    History of blood transfusion    History of Clostridioides difficile infection    History of prostate cancer 03/21/2016   History of PSVT (paroxysmal supraventricular tachycardia)    HLD (hyperlipidemia)    HTN (hypertension)    Hx of CABG 2006   Hypercholesteremia 03/21/2016   Hypothyroidism    LAE (left atrial enlargement) 12/2018   Severe, Noted on ECHO   LVH (left ventricular hypertrophy) 12/2018   Mild, Noted on ECHO   Medication management 03/21/2016   Mitral annular calcification 12/2018    with mild MS, Noted on ECHO   Morbid obesity ([place]) 03/21/2016   Myocardial infarct (HCC)    OSA (obstructive sleep apnea) 03/21/2016   uses oxygen at night time   Pain in right ankle and joints of right foot 03/21/2016   Paronychia 03/21/2016   Phimosis    Pneumonia    Primary insomnia 03/21/2016   Prostate cancer ([place]) 2008   Pulmonary hypertension ([place]) 12/2018   Moderate, Noted on ECHO   Tobacco dependence 03/21/2016   Tricuspid regurgitation 12/2018   Mild, Noted on ECHO    Past Surgical History:  Procedure Laterality Date   ANKLE SURGERY Right 12/2013   CARDIAC CATHETERIZATION N/A 05/08/2016   Procedure: Left Heart Cath and Cors/Grafts Angiography;  Surgeon: Belva Crome, MD;  Location: [facility];  Service: Cardiovascular;  Laterality: N/A;   CIRCUMCISION N/A 10/07/2019   Procedure: CIRCUMCISION ADULT;  Surgeon: Ardis Hughs, MD;  Location: [facility];  Service: Urology;  Laterality: N/A;   CORONARY ARTERY BYPASS GRAFT  2006   x3   ELECTROPHYSIOLOGIC STUDY N/A 08/24/2016   Procedure: SVT Ablation;  Surgeon: Will Meredith Leeds, MD;  Location: [facility];  Service: Cardiovascular;  Laterality: N/A;   PROSTATE SURGERY  2008   PTCA  There were no vitals filed for this visit.   Subjective Assessment - 11/23/21 1241     Subjective My shoulder feels good today.  MRI scheduled for 12/27    Pertinent History a-fib, CAD, colon cancer, DM, MI, sleep apnea, Rt tennis elbow.    Patient Stated Goals reduce Lt shoulder pain with use, improve Lt shoulder strength, A/ROM and use    Currently in Pain? No/denies                [facility] PT Assessment - 11/23/21 0001       Observation/Other Assessments   Focus on Therapeutic Outcomes (FOTO)  47 (goal is 61)      AROM   Left Shoulder Flexion 110 Degrees    Left Shoulder External Rotation --   laterl to T1                          [facility] Adult PT Treatment/Exercise - 11/23/21 0001       Neck  Exercises: Machines for Strengthening   UBE (Upper Arm Bike) 6 min (3/3) seat 10 Level 1.5- PT present to disucss progress      Shoulder Exercises: Standing   Flexion Strengthening;Both;20 reps;Weights    Shoulder Flexion Weight (lbs) 3 lbs    Flexion Limitations scaption 3# 2x10    ABduction Strengthening;Both;20 reps;Weights    Shoulder ABduction Weight (lbs) 3 lb    Extension Strengthening;Left;20 reps    Extension Weight (lbs) 3 lb    Row Strengthening;Left;20 reps    Row Weight (lbs) 3lb    Other Standing Exercises wall push ups 2x10- tactile cues required to reduce scapular elevation      Shoulder Exercises: Therapy Ball   Other Therapy Ball Exercises 4 way ball rolls x 20 each, red ball   fatigue and UT pain with this     Shoulder Exercises: Stretch   Other Shoulder Stretches upper trap stretch Rt and Lt 2x20 seconds each                       PT Short Term Goals - 11/02/21 1303       PT SHORT TERM GOAL #1   Title be independent in initial HEP    Status Achieved      PT SHORT TERM GOAL #2   Title report < or = to 7/10 Lt shoulder pain with use    Baseline 6-7/10 max and resolves quickly    Status On-going      PT SHORT TERM GOAL #3   Title report > or = to 40% use of Lt UE with functional tasks    Baseline 40%    Status Achieved      PT SHORT TERM GOAL #4   Title demonstrate Lt shoulder A/ROM flexion to > or = to 100 degrees for overhead reaching    Baseline 110    Status Achieved               PT Long Term Goals - 11/23/21 1241       PT LONG TERM GOAL #1   Title be independent in advanced HEP    Status On-going      PT LONG TERM GOAL #2   Title improve FOTO to > or = to 61 to improve function    Baseline 47 (improved from 34)    Status On-going      PT LONG TERM GOAL #3  Title demonstrate > or = to Lt shoulder flexion to > or = to 120 degrees to improve reaching overhead    Baseline 110    Status On-going      PT LONG TERM  GOAL #4   Title report > or = to 60% use of Lt UE for daily tasks due to improved functional strength    Baseline 70% use with daily tasks    Status Achieved      PT LONG TERM GOAL #5   Title report < or = to 5/10 Lt shoulder pain with reaching and lifting    Baseline 7/10    Status On-going      PT LONG TERM GOAL #6   Title demonstrate Lt UE ER to T2 to improve self-care    Baseline lateral to T2    Status On-going                   Plan - 11/23/21 1303     Clinical Impression Statement Patient is making steady progress and reports 70% overall improvement in symptoms since the start of care.  Pt is limited with reaching overhead and heaving lifting.  He is doing these things but experiences pain with it.  Pain increased to high levels with these tasks and increases to 7/10 at times.  Pt with improved ER today and flexion remains limited at 110 degrees flexion.  FOTO is improved from 55 to 41 today indicating improved function. He would benefit from continued skilled PT for ROM and shoulder stability.  He has an MRI scheduled for next week.    PT Frequency 2x / week    PT Duration 8 weeks    PT Treatment/Interventions ADLs/Self Care Home Management;Cryotherapy;Electrical Stimulation;Moist Heat;Therapeutic exercise;Therapeutic activities;Functional mobility training;Patient/family education;Manual techniques;Passive range of motion;Dry needling;Taping;Neuromuscular re-education    PT Next Visit Plan 10th visit (FOTO already done), Lt shoulder strength, flexibility and endurance.  ERO next too?    Consulted and Agree with Plan of Care Patient             Patient will benefit from skilled therapeutic intervention in order to improve the following deficits and impairments:  Decreased activity tolerance, Decreased range of motion, Decreased endurance, Decreased strength, Hypomobility, Impaired flexibility, Postural dysfunction, Improper body mechanics, Pain, Increased muscle  spasms  Visit Diagnosis: Muscle weakness (generalized)  Stiffness of left shoulder, not elsewhere classified  Chronic left shoulder pain     Problem List Patient Active Problem List   Diagnosis Date Noted   Type 2 diabetes mellitus with hyperglycemia, without long-term current use of insulin ([place]) 04/29/2020   Type 2 diabetes mellitus with stage 3a chronic kidney disease, without long-term current use of insulin ([place]) 04/29/2020   Hypertriglyceridemia 04/29/2020   CHF exacerbation ([place]) 12/25/2018   Carotid artery calcification 09/09/2018   Coronary artery disease involving coronary bypass graft of native heart without angina pectoris 07/22/2017   Acute on chronic systolic (congestive) heart failure ([place]) 06/10/2017   COPD GOLD II with reversibility 04/03/2017   Chronic respiratory failure with hypoxia and hypercapnia ([place]) 04/03/2017   Acute pulmonary edema (HCC)    Acute on chronic diastolic ACC/AHA stage C congestive heart failure (HCC)    Acute exacerbation of congestive heart failure ([place]) 02/26/2017   Hyperkalemia 02/26/2017   Prolonged QT interval 02/26/2017   Anemia 02/26/2017   Skin abscess 02/13/2017   Dyspnea on exertion 02/12/2017   Enteritis due to Clostridium difficile 01/05/2017   GI bleed 01/03/2017  Symptomatic anemia 01/03/2017   Hypothyroidism, acquired 01/03/2017   Cough    Diarrhea    AVNRT (AV nodal re-entry tachycardia) ([place]) 08/24/2016   Chest pain 05/05/2016   NSTEMI (non-ST elevated myocardial infarction) ([place]) 05/05/2016   SVT (supraventricular tachycardia) ([place]) 03/21/2016   S/P CABG x 3 03/21/2016   Morbid obesity due to excess calories ([place]) complicated by hbp/hyperlipidemia  03/21/2016   Essential hypertension 03/21/2016   Hyperlipidemia LDL goal <70 03/21/2016   Paronychia 03/21/2016   Tobacco dependence 03/21/2016   Pain in right ankle and joints of right foot 03/21/2016   Chronic pain 03/21/2016   OSA (obstructive sleep apnea)  03/21/2016   Medication management 03/21/2016   History of prostate cancer 03/21/2016   Primary insomnia 03/21/2016   Post-traumatic osteoarthritis of right ankle 11/07/2015   Closed displaced fracture of medial malleolus of right tibia 10/24/2015   Sigurd Sos, PT 11/23/21 1:13 PM   [facility] @ [place], [place], [address] Phone: [phone]   Fax:  [phone]  Name: Jeremiah [name] MRN: [id number] Date of Birth: [birth year]

## 2021-11-28 ENCOUNTER — Ambulatory Visit: Payer: Federal, State, Local not specified - PPO

## 2021-11-28 ENCOUNTER — Other Ambulatory Visit: Payer: Self-pay

## 2021-11-28 DIAGNOSIS — M6281 Muscle weakness (generalized): Secondary | ICD-10-CM | POA: Diagnosis not present

## 2021-11-28 DIAGNOSIS — M25612 Stiffness of left shoulder, not elsewhere classified: Secondary | ICD-10-CM

## 2021-11-28 DIAGNOSIS — G8929 Other chronic pain: Secondary | ICD-10-CM | POA: Diagnosis not present

## 2021-11-28 DIAGNOSIS — G4733 Obstructive sleep apnea (adult) (pediatric): Secondary | ICD-10-CM | POA: Diagnosis not present

## 2021-11-28 DIAGNOSIS — M25512 Pain in left shoulder: Secondary | ICD-10-CM

## 2021-11-28 DIAGNOSIS — I5033 Acute on chronic diastolic (congestive) heart failure: Secondary | ICD-10-CM | POA: Diagnosis not present

## 2021-11-28 NOTE — Therapy (Signed)
[facility] @ [place], [place], [address] Phone: [phone]   Fax:  [phone]  Physical Therapy Recertification  Patient Details  Name: Jeremiah [name] MRN: [id number] Date of Birth: December 08, 1945 Referring Provider (PT): Dorthy Cooler, Dibas/Alexander, Marylyn Ishihara, MD   Encounter Date: 11/28/2021   PT End of Session - 11/28/21 1246     Visit Number 10    Date for PT Re-Evaluation 11/29/21    Authorization Type [employer] Federal-53 remaining for year    Authorization - Visit Number 10    Authorization - Number of Visits 53    Progress Note Due on Visit 10    PT Start Time 1157    PT Stop Time 2620    PT Time Calculation (min) 35 min    Activity Tolerance Patient tolerated treatment well    Behavior During Therapy [facility] for tasks assessed/performed             Past Medical History:  Diagnosis Date   A-fib ([place])    Anemia    Aortic valve sclerosis 12/2018   Noted on ECHO   Arthritis    CAD (coronary artery disease)    a. 2006: CABG in 2006 with LIMA-LAD, SVG-OM1, and SVG-RPDA   Cardiomegaly 12/2018   Stable, noted on CXR   Carpal tunnel syndrome    Right   Chronic pain 03/21/2016   Colon cancer ([place]) 2006   COPD (chronic obstructive pulmonary disease) ([place])    Diabetes mellitus without complication ([place])    DVT (deep venous thrombosis) ([place])    Right   Essential hypertension 03/21/2016   GERD (gastroesophageal reflux disease)    History of blood transfusion    History of Clostridioides difficile infection    History of prostate cancer 03/21/2016   History of PSVT (paroxysmal supraventricular tachycardia)    HLD (hyperlipidemia)    HTN (hypertension)    Hx of CABG 2006   Hypercholesteremia 03/21/2016   Hypothyroidism    LAE (left atrial enlargement) 12/2018   Severe, Noted on ECHO   LVH (left ventricular hypertrophy) 12/2018   Mild, Noted on ECHO   Medication management 03/21/2016   Mitral annular calcification  12/2018   with mild MS, Noted on ECHO   Morbid obesity ([place]) 03/21/2016   Myocardial infarct (HCC)    OSA (obstructive sleep apnea) 03/21/2016   uses oxygen at night time   Pain in right ankle and joints of right foot 03/21/2016   Paronychia 03/21/2016   Phimosis    Pneumonia    Primary insomnia 03/21/2016   Prostate cancer ([place]) 2008   Pulmonary hypertension ([place]) 12/2018   Moderate, Noted on ECHO   Tobacco dependence 03/21/2016   Tricuspid regurgitation 12/2018   Mild, Noted on ECHO    Past Surgical History:  Procedure Laterality Date   ANKLE SURGERY Right 12/2013   CARDIAC CATHETERIZATION N/A 05/08/2016   Procedure: Left Heart Cath and Cors/Grafts Angiography;  Surgeon: Belva Crome, MD;  Location: [facility];  Service: Cardiovascular;  Laterality: N/A;   CIRCUMCISION N/A 10/07/2019   Procedure: CIRCUMCISION ADULT;  Surgeon: Ardis Hughs, MD;  Location: [facility];  Service: Urology;  Laterality: N/A;   CORONARY ARTERY BYPASS GRAFT  2006   x3   ELECTROPHYSIOLOGIC STUDY N/A 08/24/2016   Procedure: SVT Ablation;  Surgeon: Will Meredith Leeds, MD;  Location: [facility];  Service: Cardiovascular;  Laterality: N/A;   PROSTATE SURGERY  2008   PTCA  There were no vitals filed for this visit.   Subjective Assessment - 11/28/21 1237     Subjective Patient states his right shoulder is doing ok but left shoulder is about 6/10.    Pertinent History a-fib, CAD, colon cancer, DM, MI, sleep apnea, Rt tennis elbow.    Diagnostic tests Dr Sheppard Coil: diagnostic US- rotator cuff tear on Lt    Patient Stated Goals reduce Lt shoulder pain with use, improve Lt shoulder strength, A/ROM and use    Currently in Pain? Yes    Pain Score 6     Pain Location Shoulder    Pain Orientation Left    Pain Descriptors / Indicators Aching;Dull;Nagging    Pain Onset 1 to 4 weeks ago                [facility] PT Assessment - 11/28/21 0001       Assessment   Medical Diagnosis Lt  anterior shoulder pain/rotator cuff tear    Referring Provider (PT) [place], Dibas/Alexander, Marylyn Ishihara, MD    Onset Date/Surgical Date 07/31/21    Hand Dominance Right    Prior Therapy none for shoulder      Observation/Other Assessments   Focus on Therapeutic Outcomes (FOTO)  45 (goal is 61)      AROM   Left Shoulder Flexion 127 Degrees    Left Shoulder External Rotation --   to C7     Strength   Overall Strength Comments Scaption with ER 4/5, Scaption with IR 3+5    Left Shoulder Flexion 4/5    Left Shoulder ABduction 4/5    Left Shoulder Internal Rotation 4+/5    Left Shoulder External Rotation 4-/5                           [facility] Adult PT Treatment/Exercise - 11/28/21 0001       Neck Exercises: Machines for Strengthening   UBE (Upper Arm Bike) 6 min (3/3) seat 10 Level 1.5- PT present to disucss progress      Shoulder Exercises: Standing   Flexion Strengthening;Both;20 reps;Weights    Shoulder Flexion Weight (lbs) 3 lbs    Flexion Limitations scaption 3# 2x10    ABduction Strengthening;Both;20 reps;Weights    Shoulder ABduction Weight (lbs) 3 lb    Extension Strengthening;Left;20 reps    Extension Weight (lbs) 3 lb    Row Strengthening;Left;20 reps    Row Weight (lbs) 3lb    Other Standing Exercises wall push ups 2x10- tactile cues required to reduce scapular elevation      Shoulder Exercises: Therapy Ball   Other Therapy Ball Exercises 4 way ball rolls x 20 each, red ball   fatigue and UT pain with this     Shoulder Exercises: Stretch   Corner Stretch 5 reps;10 seconds    Corner Stretch Limitations done in doorway    Internal Rotation Stretch 10 seconds    Internal Rotation Stretch Limitations towel stretch    External Rotation Stretch 5 reps;10 seconds                       PT Short Term Goals - 11/02/21 1303       PT SHORT TERM GOAL #1   Title be independent in initial HEP    Status Achieved      PT SHORT TERM GOAL #2   Title  report < or = to 7/10 Lt shoulder pain with use  Baseline 6-7/10 max and resolves quickly    Status On-going      PT SHORT TERM GOAL #3   Title report > or = to 40% use of Lt UE with functional tasks    Baseline 40%    Status Achieved      PT SHORT TERM GOAL #4   Title demonstrate Lt shoulder A/ROM flexion to > or = to 100 degrees for overhead reaching    Baseline 110    Status Achieved               PT Long Term Goals - 11/23/21 1241       PT LONG TERM GOAL #1   Title be independent in advanced HEP    Status On-going      PT LONG TERM GOAL #2   Title improve FOTO to > or = to 61 to improve function    Baseline 47 (improved from 34)    Status On-going      PT LONG TERM GOAL #3   Title demonstrate > or = to Lt shoulder flexion to > or = to 120 degrees to improve reaching overhead    Baseline 110    Status On-going      PT LONG TERM GOAL #4   Title report > or = to 60% use of Lt UE for daily tasks due to improved functional strength    Baseline 70% use with daily tasks    Status Achieved      PT LONG TERM GOAL #5   Title report < or = to 5/10 Lt shoulder pain with reaching and lifting    Baseline 7/10    Status On-going      PT LONG TERM GOAL #6   Title demonstrate Lt UE ER to T2 to improve self-care    Baseline lateral to T2    Status On-going                   Plan - 11/28/21 1301     Clinical Impression Statement Mathias has made progress and reported 70% improvement overall on last visit but FOTO is unchanged from last visit.  He continues to have some functional limitations.  He has MRI today but states he is refusing any injections or surgery.  He is doing well with his HEP but needs some verbal cues on technique at times.  He would benefit from a few more sessions to obtains results of MRI and proceed accordingly.    Personal Factors and Comorbidities Comorbidity 2    Comorbidities DM, a-fib    Examination-Activity Limitations  Sleep;Carry;Lift;Reach Overhead    Examination-Participation Restrictions Laundry;Yard Work    Stability/Clinical Decision Making Stable/Uncomplicated    Clinical Decision Making Moderate    Rehab Potential Good    PT Frequency 1x / week    PT Duration 4 weeks    PT Treatment/Interventions ADLs/Self Care Home Management;Cryotherapy;Electrical Stimulation;Moist Heat;Therapeutic exercise;Therapeutic activities;Functional mobility training;Patient/family education;Manual techniques;Passive range of motion;Dry needling;Taping;Neuromuscular re-education    PT Next Visit Plan Extend PT 1 time per week x 4 weeks to obtain results of MRI and progress HEP accordingly.    PT Home Exercise Plan Access Code: [email or web address]    Consulted and Agree with Plan of Care Patient             Patient will benefit from skilled therapeutic intervention in order to improve the following deficits and impairments:  Decreased activity tolerance, Decreased range of motion, Decreased endurance, Decreased  strength, Hypomobility, Impaired flexibility, Postural dysfunction, Improper body mechanics, Pain, Increased muscle spasms  Visit Diagnosis: Muscle weakness (generalized) - Plan: PT plan of care cert/re-cert  Stiffness of left shoulder, not elsewhere classified - Plan: PT plan of care cert/re-cert  Chronic left shoulder pain - Plan: PT plan of care cert/re-cert     Problem List Patient Active Problem List   Diagnosis Date Noted   Type 2 diabetes mellitus with hyperglycemia, without long-term current use of insulin ([place]) 04/29/2020   Type 2 diabetes mellitus with stage 3a chronic kidney disease, without long-term current use of insulin ([place]) 04/29/2020   Hypertriglyceridemia 04/29/2020   CHF exacerbation ([place]) 12/25/2018   Carotid artery calcification 09/09/2018   Coronary artery disease involving coronary bypass graft of native heart without angina pectoris 07/22/2017   Acute on chronic systolic (congestive)  heart failure ([place]) 06/10/2017   COPD GOLD II with reversibility 04/03/2017   Chronic respiratory failure with hypoxia and hypercapnia ([place]) 04/03/2017   Acute pulmonary edema (HCC)    Acute on chronic diastolic ACC/AHA stage C congestive heart failure (HCC)    Acute exacerbation of congestive heart failure ([place]) 02/26/2017   Hyperkalemia 02/26/2017   Prolonged QT interval 02/26/2017   Anemia 02/26/2017   Skin abscess 02/13/2017   Dyspnea on exertion 02/12/2017   Enteritis due to Clostridium difficile 01/05/2017   GI bleed 01/03/2017   Symptomatic anemia 01/03/2017   Hypothyroidism, acquired 01/03/2017   Cough    Diarrhea    AVNRT (AV nodal re-entry tachycardia) ([place]) 08/24/2016   Chest pain 05/05/2016   NSTEMI (non-ST elevated myocardial infarction) ([place]) 05/05/2016   SVT (supraventricular tachycardia) ([place]) 03/21/2016   S/P CABG x 3 03/21/2016   Morbid obesity due to excess calories ([place]) complicated by hbp/hyperlipidemia  03/21/2016   Essential hypertension 03/21/2016   Hyperlipidemia LDL goal <70 03/21/2016   Paronychia 03/21/2016   Tobacco dependence 03/21/2016   Pain in right ankle and joints of right foot 03/21/2016   Chronic pain 03/21/2016   OSA (obstructive sleep apnea) 03/21/2016   Medication management 03/21/2016   History of prostate cancer 03/21/2016   Primary insomnia 03/21/2016   Post-traumatic osteoarthritis of right ankle 11/07/2015   Closed displaced fracture of medial malleolus of right tibia 10/24/2015    Anderson Malta B. Haidynn Almendarez, PT 12/27/221:26 PM   [facility] @ [place], [place], [address] Phone: [phone]   Fax:  [phone]  Name: Jakeem Grape MRN: [id number] Date of Birth: [birth year]

## 2021-12-02 ENCOUNTER — Other Ambulatory Visit: Payer: Self-pay

## 2021-12-02 ENCOUNTER — Ambulatory Visit
Admission: RE | Admit: 2021-12-02 | Discharge: 2021-12-02 | Disposition: A | Discharge status: Home / Self Care | Payer: Federal, State, Local not specified - PPO | Source: Ambulatory Visit | Attending: Sports Medicine | Admitting: Sports Medicine

## 2021-12-02 DIAGNOSIS — M19012 Primary osteoarthritis, left shoulder: Secondary | ICD-10-CM | POA: Diagnosis not present

## 2021-12-02 DIAGNOSIS — M25412 Effusion, left shoulder: Secondary | ICD-10-CM | POA: Diagnosis not present

## 2021-12-02 DIAGNOSIS — M25512 Pain in left shoulder: Secondary | ICD-10-CM

## 2021-12-03 DIAGNOSIS — G4733 Obstructive sleep apnea (adult) (pediatric): Secondary | ICD-10-CM | POA: Diagnosis not present

## 2021-12-03 DIAGNOSIS — I5033 Acute on chronic diastolic (congestive) heart failure: Secondary | ICD-10-CM | POA: Diagnosis not present

## 2021-12-07 DIAGNOSIS — G4733 Obstructive sleep apnea (adult) (pediatric): Secondary | ICD-10-CM | POA: Diagnosis not present

## 2021-12-10 ENCOUNTER — Other Ambulatory Visit: Payer: Federal, State, Local not specified - PPO

## 2021-12-27 ENCOUNTER — Other Ambulatory Visit: Payer: Self-pay

## 2021-12-27 ENCOUNTER — Ambulatory Visit: Payer: Federal, State, Local not specified - PPO | Attending: Family Medicine

## 2021-12-27 DIAGNOSIS — M6281 Muscle weakness (generalized): Secondary | ICD-10-CM

## 2021-12-27 DIAGNOSIS — M25512 Pain in left shoulder: Secondary | ICD-10-CM | POA: Insufficient documentation

## 2021-12-27 DIAGNOSIS — M25612 Stiffness of left shoulder, not elsewhere classified: Secondary | ICD-10-CM

## 2021-12-27 DIAGNOSIS — G8929 Other chronic pain: Secondary | ICD-10-CM

## 2021-12-27 NOTE — Patient Instructions (Signed)
Revised previous HEP.

## 2021-12-27 NOTE — Therapy (Signed)
[facility] @ [place], [place], [address] Phone: [phone]   Fax:  [phone]  Physical Therapy Recertification  Patient Details  Name: Jeremiah [name] MRN: [id number] Date of Birth: [birth year] Referring Provider (PT): Jeremiah [name], Dibas/Alexander, Marylyn Ishihara, MD   Encounter Date: 12/27/2021   PT End of Session - 12/27/21 1153     Visit Number 11    Date for PT Re-Evaluation 02/21/22    Authorization Type [employer]-53 remaining for year    Authorization - Visit Number 11    Authorization - Number of Visits 53    Progress Note Due on Visit 20    PT Start Time 1148    PT Stop Time 1230    PT Time Calculation (min) 42 min    Activity Tolerance Patient tolerated treatment well    Behavior During Therapy Jeremiah [name] for tasks assessed/performed             Past Medical History:  Diagnosis Date   A-fib ([place])    Anemia    Aortic valve sclerosis 12/2018   Noted on ECHO   Arthritis    CAD (coronary artery disease)    a. 2006: CABG in 2006 with LIMA-LAD, SVG-OM1, and SVG-RPDA   Cardiomegaly 12/2018   Stable, noted on CXR   Carpal tunnel syndrome    Right   Chronic pain 03/21/2016   Colon cancer ([place]) 2006   COPD (chronic obstructive pulmonary disease) ([place])    Diabetes mellitus without complication ([place])    DVT (deep venous thrombosis) ([place])    Right   Essential hypertension 03/21/2016   GERD (gastroesophageal reflux disease)    History of blood transfusion    History of Clostridioides difficile infection    History of prostate cancer 03/21/2016   History of PSVT (paroxysmal supraventricular tachycardia)    HLD (hyperlipidemia)    HTN (hypertension)    Hx of CABG 2006   Hypercholesteremia 03/21/2016   Hypothyroidism    LAE (left atrial enlargement) 12/2018   Severe, Noted on ECHO   LVH (left ventricular hypertrophy) 12/2018   Mild, Noted on ECHO   Medication management 03/21/2016   Mitral annular calcification  12/2018   with mild MS, Noted on ECHO   Morbid obesity ([place]) 03/21/2016   Myocardial infarct (HCC)    OSA (obstructive sleep apnea) 03/21/2016   uses oxygen at night time   Pain in right ankle and joints of right foot 03/21/2016   Paronychia 03/21/2016   Phimosis    Pneumonia    Primary insomnia 03/21/2016   Prostate cancer ([place]) 2008   Pulmonary hypertension ([place]) 12/2018   Moderate, Noted on ECHO   Tobacco dependence 03/21/2016   Tricuspid regurgitation 12/2018   Mild, Noted on ECHO    Past Surgical History:  Procedure Laterality Date   ANKLE SURGERY Right 12/2013   CARDIAC CATHETERIZATION N/A 05/08/2016   Procedure: Left Heart Cath and Cors/Grafts Angiography;  Surgeon: Jeremiah Crome, MD;  Location: [facility];  Service: Cardiovascular;  Laterality: N/A;   CIRCUMCISION N/A 10/07/2019   Procedure: CIRCUMCISION ADULT;  Surgeon: Jeremiah Hughs, MD;  Location: [facility];  Service: Urology;  Laterality: N/A;   CORONARY ARTERY BYPASS GRAFT  2006   x3   ELECTROPHYSIOLOGIC STUDY N/A 08/24/2016   Procedure: SVT Ablation;  Surgeon: Jeremiah Jeremiah Leeds, MD;  Location: [facility];  Service: Cardiovascular;  Laterality: N/A;   PROSTATE SURGERY  2008   PTCA  There were no vitals filed for this visit.   Subjective Assessment - 12/27/21 1559     Subjective Patient returns after having MRI and then subsequent visit with MD.  MRI shows AC joint OA but no tears in the rotator cuff.  Possible impingement due to loss of ROM and poor mechanis.  Patient refuses injections and certain meds.  He would like to resume PT now that MRI is complete and try to regain ROM and decrease pain.    Pertinent History a-fib, CAD, colon cancer, DM, MI, sleep apnea, Rt tennis elbow.    Diagnostic tests Jeremiah [name]: diagnostic US- rotator cuff tear on Lt    Patient Stated Goals reduce Lt shoulder pain with use, improve Lt shoulder strength, A/ROM and use    Currently in Pain? Yes    Pain Score 3      Pain Location Shoulder    Pain Orientation Left    Pain Descriptors / Indicators Aching    Pain Type Chronic pain    Pain Onset More than a month ago    Pain Frequency Intermittent    Aggravating Factors  Overhead reach, sleeping in bed    Pain Relieving Factors limiting ROM and overhead reach    Effect of Pain on Daily Activities Enjoys doing things around the house and outdoors and has not been able to do any of this in the past few months.                [facility] PT Assessment - 12/27/21 0001       Assessment   Medical Diagnosis Lt anterior shoulder pain/rotator cuff tear    Referring Provider (PT) Jeremiah [name], Dibas/Alexander, Marylyn Ishihara, MD    Onset Date/Surgical Date 07/31/21    Hand Dominance Right    Prior Therapy none for shoulder      Observation/Other Assessments   Focus on Therapeutic Outcomes (FOTO)  47 (goal is 61)      AROM   Left Shoulder Flexion 130 Degrees    Left Shoulder ABduction 95 Degrees    Left Shoulder Internal Rotation --   to L2-L3   Left Shoulder External Rotation --   to C-7     Strength   Overall Strength Comments Scaption with ER 4-/5, Scaption with IR 3+5    Left Shoulder Flexion 4/5    Left Shoulder ABduction 4/5    Left Shoulder Internal Rotation 4+/5    Left Shoulder External Rotation 4-/5                           [facility] Adult PT Treatment/Exercise - 12/27/21 0001       Shoulder Exercises: Seated   Horizontal ABduction Strengthening;Both;20 reps;Theraband    Theraband Level (Shoulder Horizontal ABduction) Level 2 (Red)    External Rotation Strengthening;Both;20 reps;Theraband    Theraband Level (Shoulder External Rotation) Level 2 (Red)      Shoulder Exercises: Prone   Retraction Strengthening;Left;20 reps;Weights    Retraction Weight (lbs) 2    Retraction Limitations unable to lie prone- done on mat table with knee on edge of mat    Extension Strengthening;Left;20 reps;Weights    Extension Weight (lbs) 2     Extension Limitations edge of mat knee on mat table - patient unable to lie prone    Horizontal ABduction 1 Strengthening;Left;20 reps;Weights    Horizontal ABduction 1 Weight (lbs) 2    Horizontal ABduction 1 Limitations edge of mat table knee  up on table fwd bent                     PT Education - 12/27/21 2039     Education Details Educated patient on arthrokinematics of the shoulder and how limited ROM and weakness of the rotator cuff can cause impingement type symptoms and AC joint irritation.    Person(s) Educated Patient    Methods Explanation;Demonstration    Comprehension Verbalized understanding              PT Short Term Goals - 12/27/21 2152       PT SHORT TERM GOAL #1   Title be independent in revised HEP    Time 4    Period Weeks    Status New    Target Date 01/24/22      PT SHORT TERM GOAL #2   Title report < or = to 7/10 Lt shoulder pain with use    Baseline 6-7/10 max and resolves quickly    Time 4    Period Weeks    Status On-going      PT SHORT TERM GOAL #3   Title report > or = to 40% use of Lt UE with functional tasks    Baseline 40%    Time 4    Period Weeks    Status On-going    Target Date 01/24/22      PT SHORT TERM GOAL #4   Title demonstrate Lt shoulder A/ROM flexion to > or = to 100 degrees for overhead reaching    Baseline 110    Time 4    Period Weeks    Status On-going    Target Date 01/24/22               PT Long Term Goals - 12/27/21 2204       PT LONG TERM GOAL #1   Title be independent in advanced HEP    Time 8    Period Weeks    Status On-going    Target Date 02/21/22      PT LONG TERM GOAL #2   Title improve FOTO to > or = to 61 to improve function    Baseline 47 (improved from 34)    Time 8    Period Weeks    Status On-going    Target Date 02/21/22      PT LONG TERM GOAL #3   Title demonstrate > or = to Lt shoulder flexion to > or = to 120 degrees to improve reaching overhead    Baseline  110    Time 8    Period Weeks    Status On-going    Target Date 02/21/22      PT LONG TERM GOAL #4   Title report > or = to 60% use of Lt UE for daily tasks due to improved functional strength    Time 8    Period Weeks    Status On-going    Target Date 02/21/22      PT LONG TERM GOAL #5   Title report < or = to 5/10 Lt shoulder pain with reaching and lifting    Baseline 7/10    Time 8    Period Weeks    Status On-going    Target Date 02/21/22      PT LONG TERM GOAL #6   Title demonstrate Lt UE ER to T2 to improve self-care    Time 8  Period Weeks    Status On-going    Target Date 02/21/22                   Plan - 12/27/21 2047     Clinical Impression Statement Patient had MRI in December.  MRI shows no tears in rotator cuff but significant AC joint OA.  He returns with continued pain in the left shoulder.  His ROM and strength are still limited.  He would benefit from resuming skilled PT to address this continued ROM and strength deficit.    Personal Factors and Comorbidities Comorbidity 2    Comorbidities DM, a-fib    Examination-Activity Limitations Sleep;Carry;Lift;Reach Overhead    Examination-Participation Restrictions Laundry;Yard Work    Stability/Clinical Decision Making Stable/Uncomplicated    Clinical Decision Making Moderate    Rehab Potential Good    PT Frequency 1x / week    PT Duration 8 weeks    PT Treatment/Interventions ADLs/Self Care Home Management;Cryotherapy;Electrical Stimulation;Moist Heat;Therapeutic exercise;Therapeutic activities;Functional mobility training;Patient/family education;Manual techniques;Passive range of motion;Dry needling;Taping;Neuromuscular re-education    PT Next Visit Plan Extend PT 1 time per week x 8 weeks to address ROM and strength deficits.    PT Home Exercise Plan Access Code: [email or web address]    Consulted and Agree with Plan of Care Patient             Patient Jeremiah benefit from skilled therapeutic intervention  in order to improve the following deficits and impairments:  Decreased activity tolerance, Decreased range of motion, Decreased endurance, Decreased strength, Hypomobility, Impaired flexibility, Postural dysfunction, Improper body mechanics, Pain, Increased muscle spasms  Visit Diagnosis: Muscle weakness (generalized)  Stiffness of left shoulder, not elsewhere classified  Chronic left shoulder pain     Problem List Patient Active Problem List   Diagnosis Date Noted   Type 2 diabetes mellitus with hyperglycemia, without long-term current use of insulin ([place]) 04/29/2020   Type 2 diabetes mellitus with stage 3a chronic kidney disease, without long-term current use of insulin ([place]) 04/29/2020   Hypertriglyceridemia 04/29/2020   CHF exacerbation ([place]) 12/25/2018   Carotid artery calcification 09/09/2018   Coronary artery disease involving coronary bypass graft of native heart without angina pectoris 07/22/2017   Acute on chronic systolic (congestive) heart failure ([place]) 06/10/2017   COPD GOLD II with reversibility 04/03/2017   Chronic respiratory failure with hypoxia and hypercapnia ([place]) 04/03/2017   Acute pulmonary edema (HCC)    Acute on chronic diastolic ACC/AHA stage C congestive heart failure (HCC)    Acute exacerbation of congestive heart failure ([place]) 02/26/2017   Hyperkalemia 02/26/2017   Prolonged QT interval 02/26/2017   Anemia 02/26/2017   Skin abscess 02/13/2017   Dyspnea on exertion 02/12/2017   Enteritis due to Clostridium difficile 01/05/2017   GI bleed 01/03/2017   Symptomatic anemia 01/03/2017   Hypothyroidism, acquired 01/03/2017   Cough    Diarrhea    AVNRT (AV nodal re-entry tachycardia) ([place]) 08/24/2016   Chest pain 05/05/2016   NSTEMI (non-ST elevated myocardial infarction) ([place]) 05/05/2016   SVT (supraventricular tachycardia) ([place]) 03/21/2016   S/P CABG x 3 03/21/2016   Morbid obesity due to excess calories ([place]) complicated by hbp/hyperlipidemia   03/21/2016   Essential hypertension 03/21/2016   Hyperlipidemia LDL goal <70 03/21/2016   Paronychia 03/21/2016   Tobacco dependence 03/21/2016   Pain in right ankle and joints of right foot 03/21/2016   Chronic pain 03/21/2016   OSA (obstructive sleep apnea) 03/21/2016   Medication management 03/21/2016   History  of prostate cancer 03/21/2016   Primary insomnia 03/21/2016   Post-traumatic osteoarthritis of right ankle 11/07/2015   Closed displaced fracture of medial malleolus of right tibia 10/24/2015    Anderson Malta B. Clearence Vitug, PT 12/27/2308:33 PM   [facility] @ [place], [place], [address] Phone: [phone]   Fax:  [phone]  Name: Jeremiah [name] MRN: [id number] Date of Birth: 1946/04/25

## 2021-12-28 NOTE — Addendum Note (Signed)
Addended by: Candyce Churn B on: 12/28/2021 10:54 AM   Modules accepted: Orders

## 2021-12-29 DIAGNOSIS — I5033 Acute on chronic diastolic (congestive) heart failure: Secondary | ICD-10-CM | POA: Diagnosis not present

## 2021-12-29 DIAGNOSIS — G4733 Obstructive sleep apnea (adult) (pediatric): Secondary | ICD-10-CM | POA: Diagnosis not present

## 2022-01-03 ENCOUNTER — Ambulatory Visit: Payer: Federal, State, Local not specified - PPO

## 2022-01-07 DIAGNOSIS — G4733 Obstructive sleep apnea (adult) (pediatric): Secondary | ICD-10-CM | POA: Diagnosis not present

## 2022-01-10 ENCOUNTER — Other Ambulatory Visit: Payer: Self-pay

## 2022-01-10 ENCOUNTER — Ambulatory Visit: Payer: Federal, State, Local not specified - PPO | Attending: Family Medicine

## 2022-01-10 DIAGNOSIS — G8929 Other chronic pain: Secondary | ICD-10-CM | POA: Diagnosis not present

## 2022-01-10 DIAGNOSIS — M25612 Stiffness of left shoulder, not elsewhere classified: Secondary | ICD-10-CM | POA: Insufficient documentation

## 2022-01-10 DIAGNOSIS — M25512 Pain in left shoulder: Secondary | ICD-10-CM | POA: Diagnosis not present

## 2022-01-10 DIAGNOSIS — M6281 Muscle weakness (generalized): Secondary | ICD-10-CM | POA: Diagnosis not present

## 2022-01-10 NOTE — Therapy (Deleted)
[facility] @ [place], [place], [address] Phone: [phone]   Fax:  [phone]  Physical Therapy Treatment  Patient Details  Name: Jeremiah [name] MRN: [id number] Date of Birth: [birth year] Referring Provider (PT): Dorthy Cooler, Dibas/Alexander, Marylyn Ishihara, MD   Encounter Date: 01/10/2022   PT End of Session - 01/10/22 1158     Visit Number 12    Date for PT Re-Evaluation 02/21/22    Authorization Type [employer]-53 remaining for year    Authorization - Visit Number 12    Authorization - Number of Visits 53    Progress Note Due on Visit 20    PT Start Time 1148    Activity Tolerance Patient tolerated treatment well    Behavior During Therapy [facility] for tasks assessed/performed             Past Medical History:  Diagnosis Date   A-fib ([place])    Anemia    Aortic valve sclerosis 12/2018   Noted on ECHO   Arthritis    CAD (coronary artery disease)    a. 2006: CABG in 2006 with LIMA-LAD, SVG-OM1, and SVG-RPDA   Cardiomegaly 12/2018   Stable, noted on CXR   Carpal tunnel syndrome    Right   Chronic pain 03/21/2016   Colon cancer ([place]) 2006   COPD (chronic obstructive pulmonary disease) ([place])    Diabetes mellitus without complication ([place])    DVT (deep venous thrombosis) ([place])    Right   Essential hypertension 03/21/2016   GERD (gastroesophageal reflux disease)    History of blood transfusion    History of Clostridioides difficile infection    History of prostate cancer 03/21/2016   History of PSVT (paroxysmal supraventricular tachycardia)    HLD (hyperlipidemia)    HTN (hypertension)    Hx of CABG 2006   Hypercholesteremia 03/21/2016   Hypothyroidism    LAE (left atrial enlargement) 12/2018   Severe, Noted on ECHO   LVH (left ventricular hypertrophy) 12/2018   Mild, Noted on ECHO   Medication management 03/21/2016   Mitral annular calcification 12/2018   with mild MS, Noted on ECHO   Morbid obesity ([place])  03/21/2016   Myocardial infarct (HCC)    OSA (obstructive sleep apnea) 03/21/2016   uses oxygen at night time   Pain in right ankle and joints of right foot 03/21/2016   Paronychia 03/21/2016   Phimosis    Pneumonia    Primary insomnia 03/21/2016   Prostate cancer ([place]) 2008   Pulmonary hypertension ([place]) 12/2018   Moderate, Noted on ECHO   Tobacco dependence 03/21/2016   Tricuspid regurgitation 12/2018   Mild, Noted on ECHO    Past Surgical History:  Procedure Laterality Date   ANKLE SURGERY Right 12/2013   CARDIAC CATHETERIZATION N/A 05/08/2016   Procedure: Left Heart Cath and Cors/Grafts Angiography;  Surgeon: Belva Crome, MD;  Location: [facility];  Service: Cardiovascular;  Laterality: N/A;   CIRCUMCISION N/A 10/07/2019   Procedure: CIRCUMCISION ADULT;  Surgeon: Ardis Hughs, MD;  Location[facility];  Service: Urology;  Laterality: N/A;   CORONARY ARTERY BYPASS GRAFT  2006   x3   ELECTROPHYSIOLOGIC STUDY N/A 08/24/2016   Procedure: SVT Ablation;  Surgeon: Will Meredith Leeds, MD;  Location: [facility];  Service: Cardiovascular;  Laterality: N/A;   PROSTATE SURGERY  2008   PTCA      There were no vitals filed for this visit.   Subjective Assessment -  01/10/22 1155     Subjective Patient states he is feeling less pain and more flexibility.  He is able to reach overhead with less pain as well.  He rates his pain at 2/10.    Pertinent History a-fib, CAD, colon cancer, DM, MI, sleep apnea, Rt tennis elbow.    Diagnostic tests Dr Sheppard Coil: diagnostic US- rotator cuff tear on Lt    Patient Stated Goals reduce Lt shoulder pain with use, improve Lt shoulder strength, A/ROM and use    Currently in Pain? Yes    Pain Score 2     Pain Location Shoulder    Pain Orientation Left    Pain Descriptors / Indicators Aching    Pain Type Chronic pain    Pain Onset More than a month ago                               [facility] Adult PT Treatment/Exercise -  01/10/22 0001       Neck Exercises: Machines for Strengthening   UBE (Upper Arm Bike) 6 min (3/3) seat 10 Level 1.5- PT present to disucss progress      Shoulder Exercises: Standing   Other Standing Exercises wall push ups 2x10- tactile cues required to reduce scapular elevation      Shoulder Exercises: Pulleys   Flexion 1 minute    Scaption 1 minute    ABduction 1 minute    Other Pulley Exercises IR x 1 min      Shoulder Exercises: Therapy Ball   Other Therapy Ball Exercises 4 way ball rolls x 20 each, red ball   fatigue and UT pain with this     Manual Therapy   Manual Therapy Passive ROM    Passive ROM Left shoulder in supine: flexion, abd, ER and IR x 10 reps each holding approx 10 sec                       PT Short Term Goals - 12/27/21 2152       PT SHORT TERM GOAL #1   Title be independent in revised HEP    Time 4    Period Weeks    Status New    Target Date 01/24/22      PT SHORT TERM GOAL #2   Title report < or = to 7/10 Lt shoulder pain with use    Baseline 6-7/10 max and resolves quickly    Time 4    Period Weeks    Status On-going      PT SHORT TERM GOAL #3   Title report > or = to 40% use of Lt UE with functional tasks    Baseline 40%    Time 4    Period Weeks    Status On-going    Target Date 01/24/22      PT SHORT TERM GOAL #4   Title demonstrate Lt shoulder A/ROM flexion to > or = to 100 degrees for overhead reaching    Baseline 110    Time 4    Period Weeks    Status On-going    Target Date 01/24/22               PT Long Term Goals - 12/27/21 2204       PT LONG TERM GOAL #1   Title be independent in advanced HEP    Time 8    Period  Weeks    Status On-going    Target Date 02/21/22      PT LONG TERM GOAL #2   Title improve FOTO to > or = to 61 to improve function    Baseline 47 (improved from 34)    Time 8    Period Weeks    Status On-going    Target Date 02/21/22      PT LONG TERM GOAL #3   Title  demonstrate > or = to Lt shoulder flexion to > or = to 120 degrees to improve reaching overhead    Baseline 110    Time 8    Period Weeks    Status On-going    Target Date 02/21/22      PT LONG TERM GOAL #4   Title report > or = to 60% use of Lt UE for daily tasks due to improved functional strength    Time 8    Period Weeks    Status On-going    Target Date 02/21/22      PT LONG TERM GOAL #5   Title report < or = to 5/10 Lt shoulder pain with reaching and lifting    Baseline 7/10    Time 8    Period Weeks    Status On-going    Target Date 02/21/22      PT LONG TERM GOAL #6   Title demonstrate Lt UE ER to T2 to improve self-care    Time 8    Period Weeks    Status On-going    Target Date 02/21/22                   Plan - 01/10/22 1246     Clinical Impression Statement Cebert is reporting improvement in overall pain and function now that we have been more attentive to his ROM issues.  He is compliant with his HEP.  He should continue to improve with focus on ROM.  He would benefit from continuing skilled PT for passive ROM, joint mobilization and RC strengthening.    Personal Factors and Comorbidities Comorbidity 2    Comorbidities DM, a-fib    Examination-Activity Limitations Sleep;Carry;Lift;Reach Overhead    Examination-Participation Restrictions Laundry;Yard Work    Stability/Clinical Decision Making Stable/Uncomplicated    Clinical Decision Making Moderate    Rehab Potential Good    PT Frequency 1x / week    PT Duration 8 weeks    PT Treatment/Interventions ADLs/Self Care Home Management;Cryotherapy;Electrical Stimulation;Moist Heat;Therapeutic exercise;Therapeutic activities;Functional mobility training;Patient/family education;Manual techniques;Passive range of motion;Dry needling;Taping;Neuromuscular re-education    PT Next Visit Plan PROM, focus on gaining ROM as well as continued RC strengthening    PT Home Exercise Plan Access Code: [email or web address]    Consulted  and Agree with Plan of Care Patient             Patient will benefit from skilled therapeutic intervention in order to improve the following deficits and impairments:  Decreased activity tolerance, Decreased range of motion, Decreased endurance, Decreased strength, Hypomobility, Impaired flexibility, Postural dysfunction, Improper body mechanics, Pain, Increased muscle spasms  Visit Diagnosis: Muscle weakness (generalized)  Stiffness of left shoulder, not elsewhere classified  Chronic left shoulder pain     Problem List Patient Active Problem List   Diagnosis Date Noted   Type 2 diabetes mellitus with hyperglycemia, without long-term current use of insulin ([place]) 04/29/2020   Type 2 diabetes mellitus with stage 3a chronic kidney disease, without long-term current use of insulin ([place])  04/29/2020   Hypertriglyceridemia 04/29/2020   CHF exacerbation ([place]) 12/25/2018   Carotid artery calcification 09/09/2018   Coronary artery disease involving coronary bypass graft of native heart without angina pectoris 07/22/2017   Acute on chronic systolic (congestive) heart failure ([place]) 06/10/2017   COPD GOLD II with reversibility 04/03/2017   Chronic respiratory failure with hypoxia and hypercapnia ([place]) 04/03/2017   Acute pulmonary edema (HCC)    Acute on chronic diastolic ACC/AHA stage C congestive heart failure (HCC)    Acute exacerbation of congestive heart failure ([place]) 02/26/2017   Hyperkalemia 02/26/2017   Prolonged QT interval 02/26/2017   Anemia 02/26/2017   Skin abscess 02/13/2017   Dyspnea on exertion 02/12/2017   Enteritis due to Clostridium difficile 01/05/2017   GI bleed 01/03/2017   Symptomatic anemia 01/03/2017   Hypothyroidism, acquired 01/03/2017   Cough    Diarrhea    AVNRT (AV nodal re-entry tachycardia) ([place]) 08/24/2016   Chest pain 05/05/2016   NSTEMI (non-ST elevated myocardial infarction) ([place]) 05/05/2016   SVT (supraventricular tachycardia) ([place]) 03/21/2016    S/P CABG x 3 03/21/2016   Morbid obesity due to excess calories ([place]) complicated by hbp/hyperlipidemia  03/21/2016   Essential hypertension 03/21/2016   Hyperlipidemia LDL goal <70 03/21/2016   Paronychia 03/21/2016   Tobacco dependence 03/21/2016   Pain in right ankle and joints of right foot 03/21/2016   Chronic pain 03/21/2016   OSA (obstructive sleep apnea) 03/21/2016   Medication management 03/21/2016   History of prostate cancer 03/21/2016   Primary insomnia 03/21/2016   Post-traumatic osteoarthritis of right ankle 11/07/2015   Closed displaced fracture of medial malleolus of right tibia 10/24/2015    Anderson Malta B. Lucas Exline, PT 01/10/2311:54 PM   [facility] @ [place], [place], [address] Phone: [phone]   Fax:  [phone]  Name: Jeremiah [name] MRN: [id number] Date of Birth: 1946/10/29

## 2022-01-10 NOTE — Therapy (Signed)
[facility] [address] [place] [place], [place], [address] Phone: [phone]   Fax:  [phone]  Physical Therapy Treatment  Patient Details  Name: Jeremiah [name] MRN: [id number] Date of Birth: 1946-11-17 Referring Provider (PT): Dorthy Cooler, Dibas/Alexander, Marylyn Ishihara, MD   Encounter Date: 01/10/2022   PT End of Session - 01/10/22 1158     Visit Number 12    Date for PT Re-Evaluation 02/21/22    Authorization Type [employer] Federal-53 remaining for year    Authorization - Visit Number 12    Authorization - Number of Visits 53    Progress Note Due on Visit 20    PT Start Time 1148    PT Stop Time 1230    PT Time Calculation (min) 42 min    Activity Tolerance Patient tolerated treatment well    Behavior During Therapy [facility] for tasks assessed/performed             Past Medical History:  Diagnosis Date   A-fib ([place])    Anemia    Aortic valve sclerosis 12/2018   Noted on ECHO   Arthritis    CAD (coronary artery disease)    a. 2006: CABG in 2006 with LIMA-LAD, SVG-OM1, and SVG-RPDA   Cardiomegaly 12/2018   Stable, noted on CXR   Carpal tunnel syndrome    Right   Chronic pain 03/21/2016   Colon cancer ([place]) 2006   COPD (chronic obstructive pulmonary disease) ([place])    Diabetes mellitus without complication ([place])    DVT (deep venous thrombosis) ([place])    Right   Essential hypertension 03/21/2016   GERD (gastroesophageal reflux disease)    History of blood transfusion    History of Clostridioides difficile infection    History of prostate cancer 03/21/2016   History of PSVT (paroxysmal supraventricular tachycardia)    HLD (hyperlipidemia)    HTN (hypertension)    Hx of CABG 2006   Hypercholesteremia 03/21/2016   Hypothyroidism    LAE (left atrial enlargement) 12/2018   Severe, Noted on ECHO   LVH (left ventricular hypertrophy) 12/2018   Mild, Noted on ECHO   Medication management 03/21/2016   Mitral annular calcification 12/2018    with mild MS, Noted on ECHO   Morbid obesity ([place]) 03/21/2016   Myocardial infarct (HCC)    OSA (obstructive sleep apnea) 03/21/2016   uses oxygen at night time   Pain in right ankle and joints of right foot 03/21/2016   Paronychia 03/21/2016   Phimosis    Pneumonia    Primary insomnia 03/21/2016   Prostate cancer ([place]) 2008   Pulmonary hypertension ([place]) 12/2018   Moderate, Noted on ECHO   Tobacco dependence 03/21/2016   Tricuspid regurgitation 12/2018   Mild, Noted on ECHO    Past Surgical History:  Procedure Laterality Date   ANKLE SURGERY Right 12/2013   CARDIAC CATHETERIZATION N/A 05/08/2016   Procedure: Left Heart Cath and Cors/Grafts Angiography;  Surgeon: Belva Crome, MD;  Location: [place] [facility];  Service: Cardiovascular;  Laterality: N/A;   CIRCUMCISION N/A 10/07/2019   Procedure: CIRCUMCISION ADULT;  Surgeon: Ardis Hughs, MD;  Location: [facility];  Service: Urology;  Laterality: N/A;   CORONARY ARTERY BYPASS GRAFT  2006   x3   ELECTROPHYSIOLOGIC STUDY N/A 08/24/2016   Procedure: SVT Ablation;  Surgeon: Will Meredith Leeds, MD;  Location: [place];  Service: Cardiovascular;  Laterality: N/A;   PROSTATE SURGERY  2008   PTCA  There were no vitals filed for this visit.   Subjective Assessment - 01/10/22 1155     Subjective Patient states he is feeling less pain and more flexibility.  He is able to reach overhead with less pain as well.  He rates his pain at 2/10.    Pertinent History a-fib, CAD, colon cancer, DM, MI, sleep apnea, Rt tennis elbow.    Diagnostic tests Dr Sheppard Coil: diagnostic US- rotator cuff tear on Lt    Patient Stated Goals reduce Lt shoulder pain with use, improve Lt shoulder strength, A/ROM and use    Currently in Pain? Yes    Pain Score 2     Pain Location Shoulder    Pain Orientation Left    Pain Descriptors / Indicators Aching    Pain Type Chronic pain    Pain Onset More than a month ago                                [facility] Adult PT Treatment/Exercise - 01/10/22 0001       Neck Exercises: Machines for Strengthening   UBE (Upper Arm Bike) 6 min (3/3) seat 10 Level 1.5- PT present to disucss progress      Shoulder Exercises: Standing   Other Standing Exercises wall push ups 2x10- tactile cues required to reduce scapular elevation      Shoulder Exercises: Pulleys   Flexion 1 minute    Scaption 1 minute    ABduction 1 minute    Other Pulley Exercises IR x 1 min      Shoulder Exercises: Therapy Ball   Other Therapy Ball Exercises 4 way ball rolls x 20 each, red ball   fatigue and UT pain with this     Manual Therapy   Manual Therapy Passive ROM    Passive ROM Left shoulder in supine: flexion, abd, ER and IR x 10 reps each holding approx 10 sec                       PT Short Term Goals - 12/27/21 2152       PT SHORT TERM GOAL #1   Title be independent in revised HEP    Time 4    Period Weeks    Status New    Target Date 01/24/22      PT SHORT TERM GOAL #2   Title report < or = to 7/10 Lt shoulder pain with use    Baseline 6-7/10 max and resolves quickly    Time 4    Period Weeks    Status On-going      PT SHORT TERM GOAL #3   Title report > or = to 40% use of Lt UE with functional tasks    Baseline 40%    Time 4    Period Weeks    Status On-going    Target Date 01/24/22      PT SHORT TERM GOAL #4   Title demonstrate Lt shoulder A/ROM flexion to > or = to 100 degrees for overhead reaching    Baseline 110    Time 4    Period Weeks    Status On-going    Target Date 01/24/22               PT Long Term Goals - 12/27/21 2204       PT LONG TERM GOAL #1   Title be  independent in advanced HEP    Time 8    Period Weeks    Status On-going    Target Date 02/21/22      PT LONG TERM GOAL #2   Title improve FOTO to > or = to 61 to improve function    Baseline 47 (improved from 34)    Time 8    Period Weeks    Status  On-going    Target Date 02/21/22      PT LONG TERM GOAL #3   Title demonstrate > or = to Lt shoulder flexion to > or = to 120 degrees to improve reaching overhead    Baseline 110    Time 8    Period Weeks    Status On-going    Target Date 02/21/22      PT LONG TERM GOAL #4   Title report > or = to 60% use of Lt UE for daily tasks due to improved functional strength    Time 8    Period Weeks    Status On-going    Target Date 02/21/22      PT LONG TERM GOAL #5   Title report < or = to 5/10 Lt shoulder pain with reaching and lifting    Baseline 7/10    Time 8    Period Weeks    Status On-going    Target Date 02/21/22      PT LONG TERM GOAL #6   Title demonstrate Lt UE ER to T2 to improve self-care    Time 8    Period Weeks    Status On-going    Target Date 02/21/22                   Plan - 01/10/22 1246     Clinical Impression Statement Jeremiah [name] is reporting improvement in overall pain and function now that we have been more attentive to his ROM issues.  He is compliant with his HEP.  He should continue to improve with focus on ROM.  He would benefit from continuing skilled PT for passive ROM, joint mobilization and RC strengthening.    Personal Factors and Comorbidities Comorbidity 2    Comorbidities DM, a-fib    Examination-Activity Limitations Sleep;Carry;Lift;Reach Overhead    Examination-Participation Restrictions Laundry;Yard Work    Stability/Clinical Decision Making Stable/Uncomplicated    Clinical Decision Making Moderate    Rehab Potential Good    PT Frequency 1x / week    PT Duration 8 weeks    PT Treatment/Interventions ADLs/Self Care Home Management;Cryotherapy;Electrical Stimulation;Moist Heat;Therapeutic exercise;Therapeutic activities;Functional mobility training;Patient/family education;Manual techniques;Passive range of motion;Dry needling;Taping;Neuromuscular re-education    PT Next Visit Plan PROM, focus on gaining ROM as well as continued RC  strengthening    PT Home Exercise Plan Access Code: [email or web address]    Consulted and Agree with Plan of Care Patient             Patient will benefit from skilled therapeutic intervention in order to improve the following deficits and impairments:  Decreased activity tolerance, Decreased range of motion, Decreased endurance, Decreased strength, Hypomobility, Impaired flexibility, Postural dysfunction, Improper body mechanics, Pain, Increased muscle spasms  Visit Diagnosis: Muscle weakness (generalized)  Stiffness of left shoulder, not elsewhere classified  Chronic left shoulder pain     Problem List Patient Active Problem List   Diagnosis Date Noted   Type 2 diabetes mellitus with hyperglycemia, without long-term current use of insulin ([place]) 04/29/2020   Type 2 diabetes mellitus  with stage 3a chronic kidney disease, without long-term current use of insulin ([place]) 04/29/2020   Hypertriglyceridemia 04/29/2020   CHF exacerbation ([place]) 12/25/2018   Carotid artery calcification 09/09/2018   Coronary artery disease involving coronary bypass graft of native heart without angina pectoris 07/22/2017   Acute on chronic systolic (congestive) heart failure ([place]) 06/10/2017   COPD GOLD II with reversibility 04/03/2017   Chronic respiratory failure with hypoxia and hypercapnia ([place]) 04/03/2017   Acute pulmonary edema (HCC)    Acute on chronic diastolic ACC/AHA stage C congestive heart failure ([place])    Acute exacerbation of congestive heart failure ([place]) 02/26/2017   Hyperkalemia 02/26/2017   Prolonged QT interval 02/26/2017   Anemia 02/26/2017   Skin abscess 02/13/2017   Dyspnea on exertion 02/12/2017   Enteritis due to Clostridium difficile 01/05/2017   GI bleed 01/03/2017   Symptomatic anemia 01/03/2017   Hypothyroidism, acquired 01/03/2017   Cough    Diarrhea    AVNRT (AV nodal re-entry tachycardia) ([place]) 08/24/2016   Chest pain 05/05/2016   NSTEMI (non-ST elevated myocardial  infarction) ([place]) 05/05/2016   SVT (supraventricular tachycardia) ([place]) 03/21/2016   S/P CABG x 3 03/21/2016   Morbid obesity due to excess calories ([place]) complicated by hbp/hyperlipidemia  03/21/2016   Essential hypertension 03/21/2016   Hyperlipidemia LDL goal <70 03/21/2016   Paronychia 03/21/2016   Tobacco dependence 03/21/2016   Pain in right ankle and joints of right foot 03/21/2016   Chronic pain 03/21/2016   OSA (obstructive sleep apnea) 03/21/2016   Medication management 03/21/2016   History of prostate cancer 03/21/2016   Primary insomnia 03/21/2016   Post-traumatic osteoarthritis of right ankle 11/07/2015   Closed displaced fracture of medial malleolus of right tibia 10/24/2015    Anderson Malta B. Shadow Stiggers, PT 01/10/2311:56 PM   [facility] @ [address] [place], [place], [address] Phone: [phone]   Fax:  [phone]  Name: Jeremiah [name] MRN: [id number] Date of Birth: 1946/01/28

## 2022-01-11 DIAGNOSIS — M25512 Pain in left shoulder: Secondary | ICD-10-CM | POA: Diagnosis not present

## 2022-01-17 ENCOUNTER — Other Ambulatory Visit: Payer: Self-pay

## 2022-01-17 ENCOUNTER — Ambulatory Visit: Payer: Federal, State, Local not specified - PPO

## 2022-01-17 DIAGNOSIS — M6281 Muscle weakness (generalized): Secondary | ICD-10-CM | POA: Diagnosis not present

## 2022-01-17 DIAGNOSIS — G8929 Other chronic pain: Secondary | ICD-10-CM | POA: Diagnosis not present

## 2022-01-17 DIAGNOSIS — M25612 Stiffness of left shoulder, not elsewhere classified: Secondary | ICD-10-CM

## 2022-01-17 DIAGNOSIS — M25512 Pain in left shoulder: Secondary | ICD-10-CM

## 2022-01-17 NOTE — Therapy (Signed)
[facility] @ [address] [place], [place], [address] Phone: [phone]   Fax:  [phone]  Physical Therapy Treatment  Patient Details  Name: Jeremiah [name] MRN: [id number] Date of Birth: 1946/01/13 Referring Provider (PT): Dorthy Cooler, Dibas/Alexander, Marylyn Ishihara, MD   Encounter Date: 01/17/2022   PT End of Session - 01/17/22 1316     Visit Number 13    Date for PT Re-Evaluation 02/21/22    Authorization Type [employer] Federal-53 remaining for year    Authorization - Visit Number 72    Authorization - Number of Visits 53    Progress Note Due on Visit 11    PT Start Time 5176    PT Stop Time 1607    PT Time Calculation (min) 45 min    Activity Tolerance Patient tolerated treatment well    Behavior During Therapy [facility] for tasks assessed/performed             Past Medical History:  Diagnosis Date   A-fib ([place])    Anemia    Aortic valve sclerosis 12/2018   Noted on ECHO   Arthritis    CAD (coronary artery disease)    a. 2006: CABG in 2006 with LIMA-LAD, SVG-OM1, and SVG-RPDA   Cardiomegaly 12/2018   Stable, noted on CXR   Carpal tunnel syndrome    Right   Chronic pain 03/21/2016   Colon cancer ([place]) 2006   COPD (chronic obstructive pulmonary disease) ([place])    Diabetes mellitus without complication ([place])    DVT (deep venous thrombosis) ([place])    Right   Essential hypertension 03/21/2016   GERD (gastroesophageal reflux disease)    History of blood transfusion    History of Clostridioides difficile infection    History of prostate cancer 03/21/2016   History of PSVT (paroxysmal supraventricular tachycardia)    HLD (hyperlipidemia)    HTN (hypertension)    Hx of CABG 2006   Hypercholesteremia 03/21/2016   Hypothyroidism    LAE (left atrial enlargement) 12/2018   Severe, Noted on ECHO   LVH (left ventricular hypertrophy) 12/2018   Mild, Noted on ECHO   Medication management 03/21/2016   Mitral annular calcification 12/2018    with mild MS, Noted on ECHO   Morbid obesity ([place]) 03/21/2016   Myocardial infarct (HCC)    OSA (obstructive sleep apnea) 03/21/2016   uses oxygen at night time   Pain in right ankle and joints of right foot 03/21/2016   Paronychia 03/21/2016   Phimosis    Pneumonia    Primary insomnia 03/21/2016   Prostate cancer ([place]) 2008   Pulmonary hypertension ([place]) 12/2018   Moderate, Noted on ECHO   Tobacco dependence 03/21/2016   Tricuspid regurgitation 12/2018   Mild, Noted on ECHO    Past Surgical History:  Procedure Laterality Date   ANKLE SURGERY Right 12/2013   CARDIAC CATHETERIZATION N/A 05/08/2016   Procedure: Left Heart Cath and Cors/Grafts Angiography;  Surgeon: Belva Crome, MD;  Location: [facility];  Service: Cardiovascular;  Laterality: N/A;   CIRCUMCISION N/A 10/07/2019   Procedure: CIRCUMCISION ADULT;  Surgeon: Ardis Hughs, MD;  [facility]: [facility];  Service: Urology;  Laterality: N/A;   CORONARY ARTERY BYPASS GRAFT  2006   x3   ELECTROPHYSIOLOGIC STUDY N/A 08/24/2016   Procedure: SVT Ablation;  Surgeon: Will Meredith Leeds, MD;  Location: [facility];  Service: Cardiovascular;  Laterality: N/A;   PROSTATE SURGERY  2008   PTCA  There were no vitals filed for this visit.   Subjective Assessment - 01/17/22 1238     Subjective Patient states he is doing well. He saw MD after last visit and was having pain at the time.  MD offered injections once again.  Patient refused.  MD then offered nitro patch, patient refused.  Patient reports no pain today.  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  He feels the therapy is the most helpful.    Pertinent History a-fib, CAD, colon cancer, DM, MI, sleep apnea, Rt tennis elbow.    Diagnostic tests Dr Sheppard Coil: diagnostic US- rotator cuff tear on Lt    Patient Stated Goals reduce Lt shoulder pain with use, improve Lt shoulder strength, A/ROM and  use    Currently in Pain? No/denies    Pain Score 0-No pain    Pain Onset More than a month ago                               [facility] Adult PT Treatment/Exercise - 01/17/22 0001       Neck Exercises: Machines for Strengthening   UBE (Upper Arm Bike) 6 min (3/3) seat 10 Level 1.5- PT present to disucss progress      Shoulder Exercises: Supine   Protraction Strengthening;Left;20 reps;Weights    Protraction Weight (lbs) 3      Shoulder Exercises: Prone   Retraction Strengthening;Left;20 reps;Weights    Retraction Weight (lbs) 3    Retraction Limitations unable to lie prone- done on mat table with knee on edge of mat    Extension Strengthening;Left;20 reps;Weights    Extension Weight (lbs) 3    Extension Limitations edge of mat knee on mat table - patient unable to lie prone    Horizontal ABduction 1 Strengthening;Left;20 reps;Weights    Horizontal ABduction 1 Weight (lbs) 3    Horizontal ABduction 1 Limitations edge of mat table knee up on table fwd bent      Shoulder Exercises: Sidelying   External Rotation Strengthening;Left;20 reps;Weights    External Rotation Weight (lbs) 3                     PT Education - 01/17/22 1317     Education Details Reviewed how pain cycle will interfere with progress and MD's suggestion to do injection or nitro patch would likely improve his rehab prognosis.    Person(s) Educated Patient    Methods Explanation    Comprehension Verbalized understanding              PT Short Term Goals - 12/27/21 2152       PT SHORT TERM GOAL #1   Title be independent in revised HEP    Time 4    Period Weeks    Status New    Target Date 01/24/22      PT SHORT TERM GOAL #2   Title report < or = to 7/10 Lt shoulder pain with use    Baseline 6-7/10 max and resolves quickly    Time 4    Period Weeks    Status On-going      PT SHORT TERM GOAL #3   Title report > or = to 40% use of Lt UE with functional tasks     Baseline 40%    Time 4  Period Weeks    Status On-going    Target Date 01/24/22      PT SHORT TERM GOAL #4   Title demonstrate Lt shoulder A/ROM flexion to > or = to 100 degrees for overhead reaching    Baseline 110    Time 4    Period Weeks    Status On-going    Target Date 01/24/22               PT Long Term Goals - 12/27/21 2204       PT LONG TERM GOAL #1   Title be independent in advanced HEP    Time 8    Period Weeks    Status On-going    Target Date 02/21/22      PT LONG TERM GOAL #2   Title improve FOTO to > or = to 61 to improve function    Baseline 47 (improved from 34)    Time 8    Period Weeks    Status On-going    Target Date 02/21/22      PT LONG TERM GOAL #3   Title demonstrate > or = to Lt shoulder flexion to > or = to 120 degrees to improve reaching overhead    Baseline 110    Time 8    Period Weeks    Status On-going    Target Date 02/21/22      PT LONG TERM GOAL #4   Title report > or = to 60% use of Lt UE for daily tasks due to improved functional strength    Time 8    Period Weeks    Status On-going    Target Date 02/21/22      PT LONG TERM GOAL #5   Title report < or = to 5/10 Lt shoulder pain with reaching and lifting    Baseline 7/10    Time 8    Period Weeks    Status On-going    Target Date 02/21/22      PT LONG TERM GOAL #6   Title demonstrate Lt UE ER to T2 to improve self-care    Time 8    Period Weeks    Status On-going    Target Date 02/21/22                   Plan - 01/17/22 1319     Clinical Impression Statement ROM seemed to have regressed today but we did have patient in a new position propped on wedge on treatment table.  He is unable to get into so many positions due to multiple orthopedic issues and overall poor flexibility.  He was able to complete strengthening exercises but had to stand and rest in between every exercise due to multiple reasons, one including gout flare up in his toes.  Jeremiah [name]  would benefit from agreeing to some form of pain control to interrupt pain cycle along with continued skilled PT for ROM and strengthening.    Personal Factors and Comorbidities Comorbidity 2    Comorbidities DM, a-fib    Examination-Activity Limitations Sleep;Carry;Lift;Reach Overhead    Examination-Participation Restrictions Laundry;Yard Work    Stability/Clinical Decision Making Stable/Uncomplicated    Clinical Decision Making Moderate    Rehab Potential Good    PT Frequency 1x / week    PT Duration 8 weeks    PT Treatment/Interventions ADLs/Self Care Home Management;Cryotherapy;Electrical Stimulation;Moist Heat;Therapeutic exercise;Therapeutic activities;Functional mobility training;Patient/family education;Manual techniques;Passive range of motion;Dry needling;Taping;Neuromuscular re-education  PT Next Visit Plan PROM, focus on gaining ROM as well as continued RC strengthening    PT Home Exercise Plan Access Code: [email or web address]    Consulted and Agree with Plan of Care Patient             Patient will benefit from skilled therapeutic intervention in order to improve the following deficits and impairments:  Decreased activity tolerance, Decreased range of motion, Decreased endurance, Decreased strength, Hypomobility, Impaired flexibility, Postural dysfunction, Improper body mechanics, Pain, Increased muscle spasms  Visit Diagnosis: Muscle weakness (generalized)  Stiffness of left shoulder, not elsewhere classified  Chronic left shoulder pain     Problem List Patient Active Problem List   Diagnosis Date Noted   Type 2 diabetes mellitus with hyperglycemia, without long-term current use of insulin ([place]) 04/29/2020   Type 2 diabetes mellitus with stage 3a chronic kidney disease, without long-term current use of insulin ([place]) 04/29/2020   Hypertriglyceridemia 04/29/2020   CHF exacerbation ([place]) 12/25/2018   Carotid artery calcification 09/09/2018   Coronary artery disease  involving coronary bypass graft of native heart without angina pectoris 07/22/2017   Acute on chronic systolic (congestive) heart failure ([place]) 06/10/2017   COPD GOLD II with reversibility 04/03/2017   Chronic respiratory failure with hypoxia and hypercapnia ([place]) 04/03/2017   Acute pulmonary edema (HCC)    Acute on chronic diastolic ACC/AHA stage C congestive heart failure ([place])    Acute exacerbation of congestive heart failure ([place]) 02/26/2017   Hyperkalemia 02/26/2017   Prolonged QT interval 02/26/2017   Anemia 02/26/2017   Skin abscess 02/13/2017   Dyspnea on exertion 02/12/2017   Enteritis due to Clostridium difficile 01/05/2017   GI bleed 01/03/2017   Symptomatic anemia 01/03/2017   Hypothyroidism, acquired 01/03/2017   Cough    Diarrhea    AVNRT (AV nodal re-entry tachycardia) ([place]) 08/24/2016   Chest pain 05/05/2016   NSTEMI (non-ST elevated myocardial infarction) ([place]) 05/05/2016   SVT (supraventricular tachycardia) ([place]) 03/21/2016   S/P CABG x 3 03/21/2016   Morbid obesity due to excess calories ([place]) complicated by hbp/hyperlipidemia  03/21/2016   Essential hypertension 03/21/2016   Hyperlipidemia LDL goal <70 03/21/2016   Paronychia 03/21/2016   Tobacco dependence 03/21/2016   Pain in right ankle and joints of right foot 03/21/2016   Chronic pain 03/21/2016   OSA (obstructive sleep apnea) 03/21/2016   Medication management 03/21/2016   History of prostate cancer 03/21/2016   Primary insomnia 03/21/2016   Post-traumatic osteoarthritis of right ankle 11/07/2015   Closed displaced fracture of medial malleolus of right tibia 10/24/2015    Anderson Malta B. Husayn Reim, PT 02/15/231:23 PM   [facility] [place] @ [place], [place], [address] Phone: [phone]   Fax:  [phone]  Name: Jeremiah [name] MRN: [id number] Date of Birth: [birth year]

## 2022-01-18 DIAGNOSIS — E039 Hypothyroidism, unspecified: Secondary | ICD-10-CM | POA: Diagnosis not present

## 2022-01-18 DIAGNOSIS — I5022 Chronic systolic (congestive) heart failure: Secondary | ICD-10-CM | POA: Diagnosis not present

## 2022-01-18 DIAGNOSIS — E7800 Pure hypercholesterolemia, unspecified: Secondary | ICD-10-CM | POA: Diagnosis not present

## 2022-01-18 DIAGNOSIS — Z8546 Personal history of malignant neoplasm of prostate: Secondary | ICD-10-CM | POA: Diagnosis not present

## 2022-01-18 DIAGNOSIS — I10 Essential (primary) hypertension: Secondary | ICD-10-CM | POA: Diagnosis not present

## 2022-01-18 DIAGNOSIS — M109 Gout, unspecified: Secondary | ICD-10-CM | POA: Diagnosis not present

## 2022-01-18 DIAGNOSIS — I4891 Unspecified atrial fibrillation: Secondary | ICD-10-CM | POA: Diagnosis not present

## 2022-01-18 DIAGNOSIS — Z0001 Encounter for general adult medical examination with abnormal findings: Secondary | ICD-10-CM | POA: Diagnosis not present

## 2022-01-18 DIAGNOSIS — K219 Gastro-esophageal reflux disease without esophagitis: Secondary | ICD-10-CM | POA: Diagnosis not present

## 2022-01-31 ENCOUNTER — Ambulatory Visit: Payer: Federal, State, Local not specified - PPO | Attending: Family Medicine

## 2022-01-31 ENCOUNTER — Other Ambulatory Visit: Payer: Self-pay

## 2022-01-31 DIAGNOSIS — M25612 Stiffness of left shoulder, not elsewhere classified: Secondary | ICD-10-CM | POA: Diagnosis not present

## 2022-01-31 DIAGNOSIS — M25512 Pain in left shoulder: Secondary | ICD-10-CM | POA: Diagnosis not present

## 2022-01-31 DIAGNOSIS — M6281 Muscle weakness (generalized): Secondary | ICD-10-CM | POA: Insufficient documentation

## 2022-01-31 DIAGNOSIS — G8929 Other chronic pain: Secondary | ICD-10-CM | POA: Diagnosis not present

## 2022-01-31 NOTE — Therapy (Signed)
[facility] @ [place], [place], [address] Phone: [phone]   Fax:  [phone]  Physical Therapy Treatment  Patient Details  Name: Jeremiah [name] MRN: [id number] Date of Birth: [birth year] Referring Provider (PT): Dorthy Cooler, [place]ib[place]/Alexander, Marylyn Ishihara, MD   Encounter Date: 01/31/2022   PT End of Session - 01/31/22 1240     Visit Number 14    Date for PT Re-Evaluation 02/21/22    Authorization Type [id number] remaining for year    Authorization - Visit Number 51    Authorization - Number of Visits 53    Progress Note Due on Visit 63    PT Start Time 6333    PT Stop Time 5456    PT Time Calculation (min) 44 min    Activity Tolerance Patient tolerated treatment well    Behavior During Therapy [facility] for tasks assessed/performed             Past Medical History:  Diagnosis Date   A-fib ([place])    Anemia    Aortic valve sclerosis 12/2018   Noted on ECHO   Arthritis    CAD (coronary artery disease)    a. 2006: CABG in 2006 with LIMA-LAD, SVG-OM1, and SVG-RPDA   Cardiomegaly 12/2018   Stable, noted on CXR   Carpal tunnel syndrome    Right   Chronic pain 03/21/2016   Colon cancer ([place]) 2006   COPD (chronic obstructive pulmonary disease) ([place])    Diabetes mellitus without complication ([place])    DVT (deep venous thrombosis) ([place])    Right   Essential hypertension 03/21/2016   GERD (gastroesophageal reflux disease)    History of blood transfusion    History of Clostridioides difficile infection    History of prostate cancer 03/21/2016   History of PSVT (paroxysmal supraventricular tachycardia)    HLD (hyperlipidemia)    HTN (hypertension)    Hx of CABG 2006   Hypercholesteremia 03/21/2016   Hypothyroidism    LAE (left atrial enlargement) 12/2018   Severe, Noted on ECHO   LVH (left ventricular hypertrophy) 12/2018   Mild, Noted on ECHO   Medication management 03/21/2016   Mitral annular calcification 12/2018    with mild MS, Noted on ECHO   Morbid obesity ([place]) 03/21/2016   Myocardial infarct (HCC)    OSA (obstructive sleep apnea) 03/21/2016   uses oxygen at night time   Pain in right ankle and joints of right foot 03/21/2016   Paronychia 03/21/2016   Phimosis    Pneumonia    Primary insomnia 03/21/2016   Prostate cancer ([place]) 2008   Pulmonary hypertension ([employer]) 12/2018   Moderate, Noted on ECHO   Tobacco dependence 03/21/2016   Tricuspid regurgitation 12/2018   Mild, Noted on ECHO    Past Surgical History:  Procedure Laterality Date   ANKLE SURGERY Right 12/2013   CARDIAC CATHETERIZATION N/A 05/08/2016   Procedure: Left Heart Cath and Cors/Grafts Angiography;  Surgeon: Belva Crome, MD;  Location: [facility];  Service: Cardiovascular;  Laterality: N/A;   CIRCUMCISION N/A 10/07/2019   Procedure: CIRCUMCISION ADULT;  Surgeon: Ardis Hughs, MD;  Location: [facility];  Service: Urology;  Laterality: N/A;   CORONARY ARTERY BYPASS GRAFT  2006   x3   ELECTROPHYSIOLOGIC STUDY N/A 08/24/2016   Procedure: SVT Ablation;  Surgeon: Will Meredith Leeds, MD;  Location: [facility];  Service: Cardiovascular;  Laterality: N/A;   PROSTATE SURGERY  2008   PTCA  There were no vitals filed for this visit.   Subjective Assessment - 01/31/22 1243     Subjective Patient states "its about the same".  "Whenever I do anything strenuous, it hurts for the rest of the evening" .    Pertinent History a-fib, CAD, colon cancer, DM, MI, sleep apnea, Rt tennis elbow.    Diagnostic tests Dr Sheppard Coil: diagnostic US- rotator cuff tear on Lt    Patient Stated Goals reduce Lt shoulder pain with use, improve Lt shoulder strength, A/ROM and use    Currently in Pain? No/denies    Pain Score 0-No pain    Pain Orientation Left    Pain Descriptors / Indicators Aching    Pain Onset More than a month ago                               [facility] Adult PT Treatment/Exercise - 01/31/22 0001        Neck Exercises: Machines for Strengthening   UBE (Upper Arm Bike) 6 min (3/3) seat 10 Level 1.5- PT present to disucss progress      Shoulder Exercises: Supine   Protraction Strengthening;Left;20 reps;Weights    Protraction Weight (lbs) 3      Shoulder Exercises: Prone   Retraction Strengthening;Left;20 reps;Weights    Retraction Weight (lbs) 3    Retraction Limitations unable to lie prone- done on mat table with knee on edge of mat    Extension Strengthening;Left;20 reps;Weights    Extension Weight (lbs) 3    Extension Limitations edge of mat knee on mat table - patient unable to lie prone    Horizontal ABduction 1 Strengthening;Left;20 reps;Weights    Horizontal ABduction 1 Weight (lbs) 3    Horizontal ABduction 1 Limitations edge of mat table knee up on table fwd bent      Shoulder Exercises: Standing   Flexion Strengthening;Both;20 reps;Weights    Shoulder Flexion Weight (lbs) 3    Diagonals Strengthening;Both;20 reps;Weights    Diagonals Weight (lbs) 3    Other Standing Exercises wall push ups 2x10- tactile cues required to reduce scapular elevation      Shoulder Exercises: Therapy Ball   Other Therapy Ball Exercises 4 way ball rolls x 20 each, red ball   fatigue and UT pain with this                    PT Education - 01/31/22 1255     Education Details Educated patient on how pushing through strenuous overhead activity may cause excess irritation as he has poor mechanics and will likely encounter impingement ranges doing overhead work.  This could cause increased scar tissue production and further limit his ROM.    Person(s) Educated Patient    Methods Explanation    Comprehension Verbalized understanding              PT Short Term Goals - 12/27/21 2152       PT SHORT TERM GOAL #1   Title be independent in revised HEP    Time 4    Period Weeks    Status New    Target Date 01/24/22      PT SHORT TERM GOAL #2   Title report < or = to 7/10  Lt shoulder pain with use    Baseline 6-7/10 max and resolves quickly    Time 4    Period Weeks    Status On-going  PT SHORT TERM GOAL #3   Title report > or = to 40% use of Lt UE with functional tasks    Baseline 40%    Time 4    Period Weeks    Status On-going    Target Date 01/24/22      PT SHORT TERM GOAL #4   Title demonstrate Lt shoulder A/ROM flexion to > or = to 100 degrees for overhead reaching    Baseline 110    Time 4    Period Weeks    Status On-going    Target Date 01/24/22               PT Long Term Goals - 12/27/21 2204       PT LONG TERM GOAL #1   Title be independent in advanced HEP    Time 8    Period Weeks    Status On-going    Target Date 02/21/22      PT LONG TERM GOAL #2   Title improve FOTO to > or = to 61 to improve function    Baseline 47 (improved from 34)    Time 8    Period Weeks    Status On-going    Target Date 02/21/22      PT LONG TERM GOAL #3   Title demonstrate > or = to Lt shoulder flexion to > or = to 120 degrees to improve reaching overhead    Baseline 110    Time 8    Period Weeks    Status On-going    Target Date 02/21/22      PT LONG TERM GOAL #4   Title report > or = to 60% use of Lt UE for daily tasks due to improved functional strength    Time 8    Period Weeks    Status On-going    Target Date 02/21/22      PT LONG TERM GOAL #5   Title report < or = to 5/10 Lt shoulder pain with reaching and lifting    Baseline 7/10    Time 8    Period Weeks    Status On-going    Target Date 02/21/22      PT LONG TERM GOAL #6   Title demonstrate Lt UE ER to T2 to improve self-care    Time 8    Period Weeks    Status On-going    Target Date 02/21/22                   Plan - 01/31/22 1259     Clinical Impression Statement Patient not making progress but is also self directing at times.  Compliance with HEP is questionable as he needs review on technique when doing the same exercises in PT session.     Personal Factors and Comorbidities Comorbidity 2    Comorbidities DM, a-fib    Examination-Activity Limitations Sleep;Carry;Lift;Reach Overhead    Examination-Participation Restrictions Laundry;Yard Work    Stability/Clinical Decision Making Stable/Uncomplicated    Clinical Decision Making Moderate    Rehab Potential Good    PT Frequency 1x / week    PT Duration 8 weeks    PT Treatment/Interventions ADLs/Self Care Home Management;Cryotherapy;Electrical Stimulation;Moist Heat;Therapeutic exercise;Therapeutic activities;Functional mobility training;Patient/family education;Manual techniques;Passive range of motion;Dry needling;Taping;Neuromuscular re-education    PT Next Visit Plan PROM, focus on gaining ROM as well as continued RC strengthening    PT Home Exercise Plan Access Code: [email or web address]    [email or web address] and  Agree with Plan of Care Patient             Patient will benefit from skilled therapeutic intervention in order to improve the following deficits and impairments:  Decreased activity tolerance, Decreased range of motion, Decreased endurance, Decreased strength, Hypomobility, Impaired flexibility, Postural dysfunction, Improper body mechanics, Pain, Increased muscle spasms  Visit Diagnosis: Muscle weakness (generalized)  Stiffness of left shoulder, not elsewhere classified  Chronic left shoulder pain     Problem List Patient Active Problem List   Diagnosis Date Noted   Type 2 diabetes mellitus with hyperglycemia, without long-term current use of insulin ([place]) 04/29/2020   Type 2 diabetes mellitus with stage 3a chronic kidney disease, without long-term current use of insulin ([place]) 04/29/2020   Hypertriglyceridemia 04/29/2020   CHF exacerbation ([place]) 12/25/2018   Carotid artery calcification 09/09/2018   Coronary artery disease involving coronary bypass graft of native heart without angina pectoris 07/22/2017   Acute on chronic systolic (congestive) heart failure ([place])  06/10/2017   COPD GOLD II with reversibility 04/03/2017   Chronic respiratory failure with hypoxia and hypercapnia ([place]) 04/03/2017   Acute pulmonary edema (HCC)    Acute on chronic diastolic ACC/AHA stage C congestive heart failure ([place])    Acute exacerbation of congestive heart failure ([place]) 02/26/2017   Hyperkalemia 02/26/2017   Prolonged QT interval 02/26/2017   Anemia 02/26/2017   Skin abscess 02/13/2017   Dyspnea on exertion 02/12/2017   Enteritis due to Clostridium difficile 01/05/2017   GI bleed 01/03/2017   Symptomatic anemia 01/03/2017   Hypothyroidism, acquired 01/03/2017   Cough    Diarrhea    AVNRT (AV nodal re-entry tachycardia) ([place]) 08/24/2016   Chest pain 05/05/2016   NSTEMI (non-ST elevated myocardial infarction) ([place]) 05/05/2016   SVT (supraventricular tachycardia) ([place]) 03/21/2016   S/P CABG x 3 03/21/2016   Morbid obesity due to excess calories ([place]) complicated by hbp/hyperlipidemia  03/21/2016   Essential hypertension 03/21/2016   Hyperlipidemia LDL goal <70 03/21/2016   Paronychia 03/21/2016   Tobacco dependence 03/21/2016   Pain in right ankle and joints of right foot 03/21/2016   Chronic pain 03/21/2016   OSA (obstructive sleep apnea) 03/21/2016   Medication management 03/21/2016   History of prostate cancer 03/21/2016   Primary insomnia 03/21/2016   Post-traumatic osteoarthritis of right ankle 11/07/2015   Closed displaced fracture of medial malleolus of right tibia 10/24/2015    Anderson Malta B. Milica Gully, PT 03/01/235:54 PM   [facility] @ [place], [place], [address] Phone: [phone]   Fax:  [phone]  Name: Stoney Karczewski MRN: [id number] Date of Birth: [birth year]

## 2022-02-02 ENCOUNTER — Ambulatory Visit: Payer: Federal, State, Local not specified - PPO | Admitting: Internal Medicine

## 2022-02-02 ENCOUNTER — Other Ambulatory Visit: Payer: Self-pay

## 2022-02-02 ENCOUNTER — Encounter: Payer: Self-pay | Admitting: Internal Medicine

## 2022-02-02 VITALS — BP 117/62 | HR 69 | Ht 67.0 in | Wt 241.4 lb

## 2022-02-02 DIAGNOSIS — G4733 Obstructive sleep apnea (adult) (pediatric): Secondary | ICD-10-CM | POA: Diagnosis not present

## 2022-02-02 DIAGNOSIS — I5042 Chronic combined systolic (congestive) and diastolic (congestive) heart failure: Secondary | ICD-10-CM

## 2022-02-02 DIAGNOSIS — I25810 Atherosclerosis of coronary artery bypass graft(s) without angina pectoris: Secondary | ICD-10-CM

## 2022-02-02 DIAGNOSIS — J449 Chronic obstructive pulmonary disease, unspecified: Secondary | ICD-10-CM

## 2022-02-02 DIAGNOSIS — I10 Essential (primary) hypertension: Secondary | ICD-10-CM

## 2022-02-02 NOTE — Patient Instructions (Signed)
Medication Instructions:  ?Your physician recommends that you continue on your current medications as directed. Please refer to the Current Medication list given to you today. ? ? ?*If you need a refill on your cardiac medications before your next appointment, please call your pharmacy* ? ? ?Follow-Up: ?At [facility], you and your health needs are our priority.  As part of our continuing mission to provide you with exceptional heart care, we have created designated Provider Care Teams.  These Care Teams include your primary Cardiologist (physician) and Advanced Practice Providers (APPs -  Physician Assistants and Nurse Practitioners) who all work together to provide you with the care you need, when you need it. ? ?We recommend signing up for the patient portal called "MyChart".  Sign up information is provided on this After Visit Summary.  MyChart is used to connect with patients for Virtual Visits (Telemedicine).  Patients are able to view lab/test results, encounter notes, upcoming appointments, etc.  Non-urgent messages can be sent to your provider as well.   ?To learn more about what you can do with MyChart, go to [email or web address].   ? ?Your next appointment:   ? ?6 months with Dr. Debara Pickett  ? ?

## 2022-02-02 NOTE — Progress Notes (Signed)
OFFICE NOTE  Chief Complaint:  Routine follow-up  Primary Care Physician: [name], MD  HPI:  Jeremiah [name] is a 76 y.o. 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 He is originally from [place] and his wife who is accompanying him today is originally from [place]. Mr. Keltz had an MI in 2006 and underwent coronary artery bypass grafting with a LIMA to LAD, SVG to OM1, and SVG to RPDA. He did fairly well with this however 2015 was having chest discomfort. He underwent a nuclear stress test which showed possible reversible ischemia. Therefore he was referred for cardiac catheterization at [facility] [place] [facility] [place]. That demonstrated all 3 patent bypass grafts with multivessel coronary disease. At that time he was also having episodes of palpitations and a monitor demonstrated PSVT. He was scheduled for possible ablation but is had chronic problems with an ankle fracture and nonhealing wound. He therefore never underwent ablation. He was placed on digoxin and has been on metoprolol but stopped the digoxin at some point in the past. He reports over the past 8-12 months she's had no further palpitations. He also has a history of dyslipidemia, hypothyroidism, and borderline diabetes. Recently he had a sleep study through [facility] which demonstrated severe obstructive sleep apnea and BiPAP was recommended with settings of 19/14 cm water pressure. Currently he denies any chest pain or worsening shortness of breath. His last echocardiogram from his cardiologist in [place] was a 2015 which showed an EF of 58% and no significant valvular disease.  07/26/2016  Jeremiah [name] returns today for follow-up. He was recently seen in the emergency department in the beginning of June for chest pain. This was associated with tachycardia and probable SVT. Troponin was noted to be elevated to 0.12. He had signs and symptoms concerning for unstable angina. He was evaluated  by my partners and felt to need cardiac catheterization. Eventually he underwent cardiac catheterization by Dr. Tamala Julian with the results as follows:  Conclusion   LM lesion, 85% stenosed. Ost 1st Mrg lesion, 100% stenosed. SVG was injected . Ost RCA to Prox RCA lesion, 100% stenosed. SVG . Origin to Prox Graft lesion, 40% stenosed. Mid RCA to Dist RCA lesion, 100% stenosed. Prox LAD lesion, 100% stenosed. LIMA .   Patent saphenous vein grafts. SVG to PDA contains eccentric 40% proximal stenosis. SVG to the circumflex is widely patent. LIMA to the LAD is widely patent. Native distal left main contains 75% stenosis. The dominant obtuse marginal branch is totally occluded. The native right coronary is totally occluded. Left ventriculogram demonstrates inferior wall hypokinesis. EF 50%. Recent chest pain in the setting of PSVT with minimal enzyme elevation likely related to demand ischemia in the setting of underlying native coronary disease.   Recommendations: Medical therapy. Management of PSVT with medications versus ablation.   Since discharge she denies any further episodes of tachycardia palpitations. He is already on a high dose of beta blocker. He's had a small amount of weight gain but has stopped smoking.  He plans to start to do more exercise and work on weight loss. I'm concerned about his recurrent palpitations and the fact that asymptomatic and has demand ischemia related to them and bypass graft insufficiency.  06/10/2017  Jeremiah [name] returns today for follow-up. It's been almost a year since I last saw him. He was found to have recurrent symptomatic SVT and underwent comprehensive EP study and was diagnosed with classic AV nodal reentry tachycardia  and subsequently underwent selective radiofrequency ablation by Dr. Curt Bears. He says since that time he's had no further episodes of tachypalpitations. Since then he was hospitalized twice, after developing C. difficile colitis and  enteritis with GI bleed in January. Subsequently he developed acute congestive heart failure was found to have a mild reduction in LVEF to 45-50% by echo in March 2018. At that time he was on furosemide 40 mg daily. He had a follow-up with Almyra Deforest, PA-C in April who noted he was on Lasix 40 mg twice daily, however he reported that he was not taking that to me today. Fact he says he is now off of Lasix. He reports worsening shortness of breath and hasn't fact had a 13 pound weight gain since April. He denies any lower extremity edema. He has been working with Dr. Melvyn Novas for his shortness of breath.  07/22/2017  Jeremiah [name] is back today for follow-up. He restarted his Lasix at my direction his weight is now down from 252-244. He's had significant improvement in swelling and reports some improvement in his breathing however he says it still not good. He does have a mildly reduced EF of 45-50% but also has some chronic lung disease which is likely contributing. He says he gets short of breath while doing certain activities in the yard and then comes inside and uses his oxygen which improves him almost immediately. He denies any orthopnea. He has not had any recent or productive cough. He has a follow-up visit with his pulmonologist tomorrow. I had ordered lab work including a metabolic profile and BNP which was never obtained.  09/09/2018  Jeremiah [name] is seen today in follow-up.  He reports some improvement in his shortness of breath.  He has joined a gym and now is exercising every day.  Weight however has been stable at 252, actually up 2 pounds from April.  Despite this his breathing has improved somewhat.  He still uses oxygen.  He did have one episode recently where he became nauseated and vomited.  He was up at the [place] and had been taking antibiotics for a dental infection.  He did undergo CT scan of the head and neck and that demonstrated bilateral carotid artery calcification.  This is not  surprising given his history of coronary artery disease and prior CABG however he has not had prior carotid Dopplers.    02/18/2019  Jeremiah [name] is seen today in follow-up.  Recently he says has had some improvement in shortness of breath.  Dr. Melvyn Novas, his pulmonologist had started him on a new nebulizer called revefenacin.  His most recent hemoglobin A1c was 7.1.  Weight is down about 4 pounds.  He denies any new chest pain.  He has had some recent anemia and work-up is apparently underway for this.  He is tentatively scheduled for an endoscopy on April 16 with Dr. Therisa Doyne.  He is concerned because of his pulmonary status that he may be at high risk of decompensation with the procedure.  He also has follow-up with his PCP in the near future.  09/14/2019  Jeremiah [name] returns today for follow-up.  He continues to do well.  His shortness of breath has improved which I think was both believed due to a combination of pulmonary optimization as well as improvement in LV function.  Recently his echo in fact showed LVEF has normalized to 60 to 65%.  He remains euvolemic and is actually 2 pounds lighter than he was seen about  8 months ago.  Hemoglobin A1c has been reasonably well-controlled however his lipids are still uncontrolled.  His total cholesterol is 211, triglycerides 494, HDL 32 and LDL of 114.  His target LDL is less than 70 and his elevated triglycerides also put him at increased cardiovascular risk.  03/02/2020  Jeremiah [name] is seen today in follow-up.  He denies any chest pain but does have some shortness of breath.  Is been some weight gain.  He reports less physical activity has been struggling with a long recovery after undergoing circumcision for issues with phimosis.  Its unfortunate that he had a difficult recovery but seems to be doing better.  Blood pressure was well controlled today.  His EKG demonstrated some worsening inferior and lateral T wave inversions.  This was previously seen however not as  significant.  His last heart cath was in 2017 which did show a patent SVG to OM and occluded SVG to right coronary.  He is anticipating restarting an exercise program and working with a Clinical research associate.  02/24/2021  Jeremiah [name] is seen today in follow-up.  Overall he says he is feeling reasonably well.  He has had some shortness of breath.  He is scheduled for a sleep study.  We discussed this because of the previously mentioned issues and that is actually scheduled tonight.  He also has a history of heart failure with diabetes which is not at target A1c of 8.3.  Lipids recently at assess her total cholesterol 106, HDL 33, LDL 49 and triglycerides 134.  07/18/2021  Jeremiah [name] is seen today for routine follow-up.  He seems to be doing well on the Jardiance at 25 mg daily.  Hemoglobin A1c is now come down to 7.3.  Blood pressure is well controlled.  EKG is unchanged showing sinus rhythm with some lateral T wave changes.  He has noted some lower blood pressures at home.  He says around the mid 90s over 57s.  Blood pressure here was elevated 130/90 however more typical readings are between 90 and 676 systolic.  He says he is asymptomatic for this.  He is on very little medicine.  1 option would be to consider backing off on the bisoprolol.  Cholesterol is very well controlled as of February total 106, HDL 33, LDL 49 triglycerides 134.  I do not believe it is "too low".  Would recommend continue his current regimen.  02/02/2022  Jeremiah [name] is seen today in follow-up.  Overall he says he is doing very well.  Weight has been fairly stable.  Blood pressure is well controlled.  He has been undergoing some rehab for a frozen shoulder.  He denies any worsening shortness of breath.  Recent labs showed total cholesterol 153, HDL 33 triglycerides 291 and LDL 73.  A1c 7.3%.  EKG shows a sinus rhythm with some inferolateral T wave changes (unchanged from prior EKG in 2022).  PMHx:  Past Medical History:  Diagnosis Date   A-fib  ([facility])    Anemia    Aortic valve sclerosis 12/2018   Noted on ECHO   Arthritis    CAD (coronary artery disease)    a. 2006: CABG in 2006 with LIMA-LAD, SVG-OM1, and SVG-RPDA   Cardiomegaly 12/2018   Stable, noted on CXR   Carpal tunnel syndrome    Right   Chronic pain 03/21/2016   Colon cancer ([facility]) 2006   COPD (chronic obstructive pulmonary disease) ([place] [facility])    Diabetes mellitus without complication ([place])    DVT (  deep venous thrombosis) ([facility])    Right   Essential hypertension 03/21/2016   GERD (gastroesophageal reflux disease)    History of blood transfusion    History of Clostridioides difficile infection    History of prostate cancer 03/21/2016   History of PSVT (paroxysmal supraventricular tachycardia)    HLD (hyperlipidemia)    HTN (hypertension)    Hx of CABG 2006   Hypercholesteremia 03/21/2016   Hypothyroidism    LAE (left atrial enlargement) 12/2018   Severe, Noted on ECHO   LVH (left ventricular hypertrophy) 12/2018   Mild, Noted on ECHO   Medication management 03/21/2016   Mitral annular calcification 12/2018   with mild MS, Noted on ECHO   Morbid obesity ([facility]) 03/21/2016   Myocardial infarct ([facility])    OSA (obstructive sleep apnea) 03/21/2016   uses oxygen at night time   Pain in right ankle and joints of right foot 03/21/2016   Paronychia 03/21/2016   Phimosis    Pneumonia    Primary insomnia 03/21/2016   Prostate cancer ([place] [facility]) 2008   Pulmonary hypertension ([place]) 12/2018   Moderate, Noted on ECHO   Tobacco dependence 03/21/2016   Tricuspid regurgitation 12/2018   Mild, Noted on ECHO    Past Surgical History:  Procedure Laterality Date   ANKLE SURGERY Right 12/2013   CARDIAC CATHETERIZATION N/A 05/08/2016   Procedure: Left Heart Cath and Cors/Grafts Angiography;  Surgeon: Belva Crome, MD;  Location: [facility];  Service: Cardiovascular;  Laterality: N/A;   CIRCUMCISION N/A 10/07/2019   Procedure: CIRCUMCISION ADULT;  Surgeon: Ardis Hughs, MD;   Location: [facility];  Service: Urology;  Laterality: N/A;   CORONARY ARTERY BYPASS GRAFT  2006   x3   ELECTROPHYSIOLOGIC STUDY N/A 08/24/2016   Procedure: SVT Ablation;  Surgeon: Will Meredith Leeds, MD;  Location: [facility];  Service: Cardiovascular;  Laterality: N/A;   PROSTATE SURGERY  2008   PTCA      FAMHx:  Family History  Problem Relation Age of Onset   Dementia Mother    Diabetes Sister     SOCHx:   reports that he quit smoking about 5 years ago. His smoking use included cigarettes. He has a 30.00 pack-year smoking history. He has never used smokeless tobacco. He reports that he does not currently use alcohol. He reports that he does not use drugs.  ALLERGIES:  Allergies  Allergen Reactions   Scallops [Shellfish Allergy] Other (See Comments)    Rash; shortness of breath   Fluzone [Influenza Virus Vaccine] Nausea And Vomiting, Palpitations and Rash    ROS: Pertinent items noted in HPI and remainder of comprehensive ROS otherwise negative.  HOME MEDS: Current Outpatient Medications  Medication Sig Dispense Refill   albuterol (PROVENTIL) (2.5 MG/3ML) 0.083% nebulizer solution Take 3 mLs (2.5 mg total) by nebulization every 4 (four) hours as needed for wheezing or shortness of breath. 75 mL 12   arformoterol (BROVANA) 15 MCG/2ML NEBU Take 2 mLs (15 mcg total) by nebulization 2 (two) times daily. 120 mL 11   aspirin 81 MG EC tablet Take 81 mg by mouth daily.     atorvastatin (LIPITOR) 80 MG tablet Take 80 mg by mouth daily.     bisoprolol (ZEBETA) 5 MG tablet TAKE 1 TABLET(5 MG) BY MOUTH DAILY 90 tablet 3   colchicine 0.6 MG tablet Take 0.6 mg by mouth as needed.     empagliflozin (JARDIANCE) 25 MG TABS tablet Take 1 tablet (25 mg total)  by mouth daily before breakfast. 30 tablet 6   ezetimibe (ZETIA) 10 MG tablet TAKE 1 TABLET(10 MG) BY MOUTH DAILY 90 tablet 2   FEROSUL 325 (65 Fe) MG tablet TAKE 1 TABLET BY MOUTH DAILY WITH BREAKFAST 30 tablet 5   furosemide  (LASIX) 40 MG tablet Take 1 tablet (40 mg total) by mouth daily. 135 tablet 1   glimepiride (AMARYL) 4 MG tablet Take 4 mg by mouth daily with breakfast.     levothyroxine (SYNTHROID, LEVOTHROID) 88 MCG tablet Take 88 mcg by mouth daily before breakfast.     losartan (COZAAR) 25 MG tablet Take 25 mg by mouth daily.     metFORMIN (GLUCOPHAGE-XR) 500 MG 24 hr tablet Take 2,000 mg by mouth daily.     OXYGEN Inhale 3 L into the lungs at bedtime. 4 lpm with 24/7 [facility]     pantoprazole (PROTONIX) 40 MG tablet Take 40 mg by mouth daily.     revefenacin (YUPELRI) 175 MCG/3ML nebulizer solution Inhale 3 mLs (175 mcg total) into the lungs every morning. 90 mL 11   revefenacin (YUPELRI) 175 MCG/3ML nebulizer solution Take 3 mLs (175 mcg total) by nebulization daily. 90 mL 0   No current facility-administered medications for this visit.    LABS/IMAGING: No results found for this or any previous visit (from the past 48 hour(s)). No results found.  WEIGHTS: Wt Readings from Last 3 Encounters:  02/02/22 241 lb 6.4 oz (109.5 kg)  07/20/21 238 lb (108 kg)  07/18/21 238 lb 9.6 oz (108.2 kg)    VITALS: BP 117/62    Pulse 69    Ht 5\' 7"  (1.702 m)    Wt 241 lb 6.4 oz (109.5 kg)    SpO2 93%    BMI 37.81 kg/m   EXAM: General appearance: alert, no distress and moderately obese Neck: no carotid bruit and no JVD Lungs: diminished breath sounds bilaterally and wheezes bilaterally Heart: regular rate and rhythm, S1, S2 normal, no murmur, click, rub or gallop Abdomen: soft, non-tender; bowel sounds normal; no masses,  no organomegaly Extremities: extremities normal, atraumatic, no cyanosis or edema Pulses: 2+ and symmetric Skin: Skin color, texture, turgor normal. No rashes or lesions Neurologic: Grossly normal Psych: [facility]  EKG: Normal sinus rhythm at 69, inferolateral T wave inversions, QTc 460 ms-personally reviewed  ASSESSMENT: Stable inferior and lateral T wave inversions, dyspnea Recent SVT  which was symptomatic-repeat cardiac catheterization (05/2016) shows patent grafts, status post ablation of AV nodal reentry tachycardia (08/2016) CAD status post three-vessel CABG in 2006 (LIMA to LAD, SVG to OM1, SVG to RPDA) in [place] Patent bypass grafts by cath in 2015 History of PSVT-on metoprolol Dyslipidemia on Lipitor OSA-BiPAP recommended COPD Chronic systolic congestive heart failure-LVEF 45-50% -improved to 60 to 65% (12/2018)  Phimosis  PLAN: 1.   Jeremiah [name] continues to do well.  He is currently undergoing some physical therapy.  He has had no change in his EKG.  He denies any worsening shortness of breath.  He denies any chest pain.  His cholesterol is well controlled.  He follows with Dr. Melvyn Novas for his COPD.  No new changes in his medicines today  Follow-up with me annually or sooner as necessary.  Pixie Casino, MD, [facility], [place] Director of the [facility] Lipid Disorders &  Cardiovascular Risk Reduction Clinic Diplomate of the American Board of Clinical Lipidology Attending Cardiologist  Direct Dial: [phone]   Fax: [phone]  Website:  [email or web address]   Jeremiah [name] 02/02/2022, 10:20 AM

## 2022-02-04 DIAGNOSIS — G4733 Obstructive sleep apnea (adult) (pediatric): Secondary | ICD-10-CM | POA: Diagnosis not present

## 2022-02-07 ENCOUNTER — Other Ambulatory Visit: Payer: Self-pay

## 2022-02-07 ENCOUNTER — Ambulatory Visit: Payer: Federal, State, Local not specified - PPO

## 2022-02-07 DIAGNOSIS — G8929 Other chronic pain: Secondary | ICD-10-CM

## 2022-02-07 DIAGNOSIS — M25612 Stiffness of left shoulder, not elsewhere classified: Secondary | ICD-10-CM

## 2022-02-07 DIAGNOSIS — M6281 Muscle weakness (generalized): Secondary | ICD-10-CM | POA: Diagnosis not present

## 2022-02-07 DIAGNOSIS — M25512 Pain in left shoulder: Secondary | ICD-10-CM | POA: Diagnosis not present

## 2022-02-07 NOTE — Therapy (Signed)
[facility] @ [address] [place], [place], [address] Phone: [phone]   Fax:  [phone]  Physical Therapy Treatment  Patient Details  Name: Jeremiah [name] MRN: [id number] Date of Birth: [birth year] Referring Provider (PT): Dorthy Cooler, Dibas/Alexander, Marylyn Ishihara, MD   Encounter Date: 02/07/2022   PT End of Session - 02/07/22 1236     Visit Number 15    Date for PT Re-Evaluation 02/21/22    Authorization Type [employer] Federal-53 remaining for year    Authorization - Visit Number 15    Authorization - Number of Visits 53    Progress Note Due on Visit 61    PT Start Time 9450    PT Stop Time 3888    PT Time Calculation (min) 35 min    Activity Tolerance Patient tolerated treatment well    Behavior During Therapy [facility] for tasks assessed/performed             Past Medical History:  Diagnosis Date   A-fib ([place])    Anemia    Aortic valve sclerosis 12/2018   Noted on ECHO   Arthritis    CAD (coronary artery disease)    a. 2006: CABG in 2006 with LIMA-LAD, SVG-OM1, and SVG-RPDA   Cardiomegaly 12/2018   Stable, noted on CXR   Carpal tunnel syndrome    Right   Chronic pain 03/21/2016   Colon cancer ([place]) 2006   COPD (chronic obstructive pulmonary disease) ([place])    Diabetes mellitus without complication ([place])    DVT (deep venous thrombosis) ([place])    Right   Essential hypertension 03/21/2016   GERD (gastroesophageal reflux disease)    History of blood transfusion    History of Clostridioides difficile infection    History of prostate cancer 03/21/2016   History of PSVT (paroxysmal supraventricular tachycardia)    HLD (hyperlipidemia)    HTN (hypertension)    Hx of CABG 2006   Hypercholesteremia 03/21/2016   Hypothyroidism    LAE (left atrial enlargement) 12/2018   Severe, Noted on ECHO   LVH (left ventricular hypertrophy) 12/2018   Mild, Noted on ECHO   Medication management 03/21/2016   Mitral annular calcification 12/2018    with mild MS, Noted on ECHO   Morbid obesity ([place]) 03/21/2016   Myocardial infarct (HCC)    OSA (obstructive sleep apnea) 03/21/2016   uses oxygen at night time   Pain in right ankle and joints of right foot 03/21/2016   Paronychia 03/21/2016   Phimosis    Pneumonia    Primary insomnia 03/21/2016   Prostate cancer ([place]) 2008   Pulmonary hypertension ([place]) 12/2018   Moderate, Noted on ECHO   Tobacco dependence 03/21/2016   Tricuspid regurgitation 12/2018   Mild, Noted on ECHO    Past Surgical History:  Procedure Laterality Date   ANKLE SURGERY Right 12/2013   CARDIAC CATHETERIZATION N/A 05/08/2016   Procedure: Left Heart Cath and Cors/Grafts Angiography;  Surgeon: Belva Crome, MD;  Location: [facility];  Service: Cardiovascular;  Laterality: N/A;   CIRCUMCISION N/A 10/07/2019   Procedure: CIRCUMCISION ADULT;  Surgeon: Ardis Hughs, MD;  Location: [facility];  Service: Urology;  Laterality: N/A;   CORONARY ARTERY BYPASS GRAFT  2006   x3   ELECTROPHYSIOLOGIC STUDY N/A 08/24/2016   Procedure: SVT Ablation;  Surgeon: Will Meredith Leeds, MD;  Location: [facility];  Service: Cardiovascular;  Laterality: N/A;   PROSTATE SURGERY  2008   PTCA  There were no vitals filed for this visit.   Subjective Assessment - 02/07/22 1237     Subjective Patient states he is "ok".  He rates his shoulder pain at 5/10.    Pertinent History a-fib, CAD, colon cancer, DM, MI, sleep apnea, Rt tennis elbow.    Diagnostic tests Dr Sheppard Coil: diagnostic US- rotator cuff tear on Lt    Patient Stated Goals reduce Lt shoulder pain with use, improve Lt shoulder strength, A/ROM and use    Currently in Pain? Yes    Pain Score 5     Pain Location Shoulder    Pain Orientation Left    Pain Descriptors / Indicators Aching;Discomfort    Pain Type Chronic pain    Pain Onset More than a month ago                               [facility] Adult PT Treatment/Exercise - 02/07/22 0001        Neck Exercises: Machines for Strengthening   UBE (Upper Arm Bike) 6 min (3/3) seat 10 Level 1.5- PT present to disucss progress      Shoulder Exercises: Standing   Other Standing Exercises wall clocks -no weight 5 reps 9      Shoulder Exercises: Stretch   Corner Stretch 3 reps;20 seconds   did in doorway   Cross Chest Stretch 3 reps;20 seconds    Cross Chest Stretch Limitations Sidelying with elevated head      Shoulder Exercises: [employer] trader hang from lat pull down bar x 5 hold 10 sec each                       PT Short Term Goals - 12/27/21 2152       PT SHORT TERM GOAL #1   Title be independent in revised HEP    Time 4    Period Weeks    Status New    Target Date 01/24/22      PT SHORT TERM GOAL #2   Title report < or = to 7/10 Lt shoulder pain with use    Baseline 6-7/10 max and resolves quickly    Time 4    Period Weeks    Status On-going      PT SHORT TERM GOAL #3   Title report > or = to 40% use of Lt UE with functional tasks    Baseline 40%    Time 4    Period Weeks    Status On-going    Target Date 01/24/22      PT SHORT TERM GOAL #4   Title demonstrate Lt shoulder A/ROM flexion to > or = to 100 degrees for overhead reaching    Baseline 110    Time 4    Period Weeks    Status On-going    Target Date 01/24/22               PT Long Term Goals - 12/27/21 2204       PT LONG TERM GOAL #1   Title be independent in advanced HEP    Time 8    Period Weeks    Status On-going    Target Date 02/21/22      PT LONG TERM GOAL #2   Title improve FOTO to > or = to 61 to improve function    Baseline 47 (improved  from 3)    Time 8    Period Weeks    Status On-going    Target Date 02/21/22      PT LONG TERM GOAL #3   Title demonstrate > or = to Lt shoulder flexion to > or = to 120 degrees to improve reaching overhead    Baseline 110    Time 8    Period Weeks    Status On-going    Target Date  02/21/22      PT LONG TERM GOAL #4   Title report > or = to 60% use of Lt UE for daily tasks due to improved functional strength    Time 8    Period Weeks    Status On-going    Target Date 02/21/22      PT LONG TERM GOAL #5   Title report < or = to 5/10 Lt shoulder pain with reaching and lifting    Baseline 7/10    Time 8    Period Weeks    Status On-going    Target Date 02/21/22      PT LONG TERM GOAL #6   Title demonstrate Lt UE ER to T2 to improve self-care    Time 8    Period Weeks    Status On-going    Target Date 02/21/22                   Plan - 02/07/22 1518     Clinical Impression Statement Jeremiah [name] was able to tolerate all stretches today.  He reported improved pain level and a feeling of improved ROM following treatment.  He would benefit from continued skilled PT for aggressive ROM and stabilization.    Personal Factors and Comorbidities Comorbidity 2    Comorbidities DM, a-fib    Examination-Activity Limitations Sleep;Carry;Lift;Reach Overhead    Stability/Clinical Decision Making Stable/Uncomplicated    Clinical Decision Making Moderate    Rehab Potential Good    PT Frequency 1x / week    PT Duration 8 weeks    PT Treatment/Interventions ADLs/Self Care Home Management;Cryotherapy;Electrical Stimulation;Moist Heat;Therapeutic exercise;Therapeutic activities;Functional mobility training;Patient/family education;Manual techniques;Passive range of motion;Dry needling;Taping;Neuromuscular re-education    PT Next Visit Plan PROM, focus on gaining ROM as well as continued RC strengthening    PT Home Exercise Plan Access Code: [email or web address]    Consulted and Agree with Plan of Care Patient             Patient will benefit from skilled therapeutic intervention in order to improve the following deficits and impairments:  Decreased activity tolerance, Decreased range of motion, Decreased endurance, Decreased strength, Hypomobility, Impaired flexibility, Postural  dysfunction, Improper body mechanics, Pain, Increased muscle spasms  Visit Diagnosis: Muscle weakness (generalized)  Stiffness of left shoulder, not elsewhere classified  Chronic left shoulder pain     Problem List Patient Active Problem List   Diagnosis Date Noted   Type 2 diabetes mellitus with hyperglycemia, without long-term current use of insulin ([place]) 04/29/2020   Type 2 diabetes mellitus with stage 3a chronic kidney disease, without long-term current use of insulin ([place]) 04/29/2020   Hypertriglyceridemia 04/29/2020   CHF exacerbation ([place]) 12/25/2018   Carotid artery calcification 09/09/2018   Coronary artery disease involving coronary bypass graft of native heart without angina pectoris 07/22/2017   Acute on chronic systolic (congestive) heart failure ([place]) 06/10/2017   COPD GOLD II with reversibility 04/03/2017   Chronic respiratory failure with hypoxia and hypercapnia (HCC) 04/03/2017   Acute pulmonary  edema (HCC)    Acute on chronic diastolic ACC/AHA stage C congestive heart failure (HCC)    Acute exacerbation of congestive heart failure ([place]) 02/26/2017   Hyperkalemia 02/26/2017   Prolonged QT interval 02/26/2017   Anemia 02/26/2017   Skin abscess 02/13/2017   Dyspnea on exertion 02/12/2017   Enteritis due to Clostridium difficile 01/05/2017   GI bleed 01/03/2017   Symptomatic anemia 01/03/2017   Hypothyroidism, acquired 01/03/2017   Cough    Diarrhea    AVNRT (AV nodal re-entry tachycardia) ([place]) 08/24/2016   Chest pain 05/05/2016   NSTEMI (non-ST elevated myocardial infarction) ([place]) 05/05/2016   SVT (supraventricular tachycardia) ([place]) 03/21/2016   S/P CABG x 3 03/21/2016   Morbid obesity due to excess calories ([place]) complicated by hbp/hyperlipidemia  03/21/2016   Essential hypertension 03/21/2016   Hyperlipidemia LDL goal <70 03/21/2016   Paronychia 03/21/2016   Tobacco dependence 03/21/2016   Pain in right ankle and joints of right foot 03/21/2016    Chronic pain 03/21/2016   OSA (obstructive sleep apnea) 03/21/2016   Medication management 03/21/2016   History of prostate cancer 03/21/2016   Primary insomnia 03/21/2016   Post-traumatic osteoarthritis of right ankle 11/07/2015   Closed displaced fracture of medial malleolus of right tibia 10/24/2015    Anderson Malta B. Ziggy Reveles, PT 03/08/233:24 PM   [facility] @ [address] [place], [place], [address] Phone: [phone]   Fax:  [phone]  Name: Jeremiah [name] MRN: [id number] Date of Birth: [birth year]

## 2022-02-14 ENCOUNTER — Other Ambulatory Visit: Payer: Self-pay

## 2022-02-14 ENCOUNTER — Ambulatory Visit: Payer: Federal, State, Local not specified - PPO

## 2022-02-14 DIAGNOSIS — M6281 Muscle weakness (generalized): Secondary | ICD-10-CM

## 2022-02-14 DIAGNOSIS — G8929 Other chronic pain: Secondary | ICD-10-CM | POA: Diagnosis not present

## 2022-02-14 DIAGNOSIS — M25612 Stiffness of left shoulder, not elsewhere classified: Secondary | ICD-10-CM | POA: Diagnosis not present

## 2022-02-14 DIAGNOSIS — M25512 Pain in left shoulder: Secondary | ICD-10-CM | POA: Diagnosis not present

## 2022-02-14 NOTE — Patient Instructions (Signed)
Continue to focus on stretches at this time.   ?

## 2022-02-14 NOTE — Therapy (Signed)
[facility] ?[facility] @ [address] ?[place], [place], [address] ?Phone: [phone]   Fax:  [phone] ? ?Physical Therapy Treatment ? ?Patient Details  ?Name: Jeremiah [name] ?MRN: [id number] ?Date of Birth: [birth year] ?Referring Provider (PT): Dorthy Cooler, Dibas/Alexander, Marylyn Ishihara, MD ? ? ?Encounter Date: 02/14/2022 ? ? PT End of Session - 02/14/22 1242   ? ? Visit Number 16   ? Date for PT Re-Evaluation 02/21/22   ? Authorization Type [employer] Federal-53 remaining for year   ? Authorization - Visit Number 16   ? Authorization - Number of Visits 53   ? Progress Note Due on Visit 20   ? PT Start Time 1235   ? PT Stop Time 1310   ? PT Time Calculation (min) 35 min   ? Activity Tolerance Patient tolerated treatment well   ? Behavior During Therapy [facility] for tasks assessed/performed   ? ?  ?  ? ?  ? ? ?Past Medical History:  ?Diagnosis Date  ? A-fib ([place])   ? Anemia   ? Aortic valve sclerosis 12/2018  ? Noted on ECHO  ? Arthritis   ? CAD (coronary artery disease)   ? a. 2006: CABG in 2006 with LIMA-LAD, SVG-OM1, and SVG-RPDA  ? Cardiomegaly 12/2018  ? Stable, noted on CXR  ? Carpal tunnel syndrome   ? Right  ? Chronic pain 03/21/2016  ? Colon cancer [facility]) 2006  ? COPD (chronic obstructive pulmonary disease) ([place])   ? Diabetes mellitus without complication ([place])   ? DVT (deep venous thrombosis) ([place])   ? Right  ? Essential hypertension 03/21/2016  ? GERD (gastroesophageal reflux disease)   ? History of blood transfusion   ? History of Clostridioides difficile infection   ? History of prostate cancer 03/21/2016  ? History of PSVT (paroxysmal supraventricular tachycardia)   ? HLD (hyperlipidemia)   ? HTN (hypertension)   ? Hx of CABG 2006  ? Hypercholesteremia 03/21/2016  ? Hypothyroidism   ? LAE (left atrial enlargement) 12/2018  ? Severe, Noted on ECHO  ? LVH (left ventricular hypertrophy) 12/2018  ? Mild, Noted on ECHO  ? Medication management 03/21/2016  ? Mitral annular calcification 12/2018   ? with mild MS, Noted on ECHO  ? Morbid obesity ([place]) 03/21/2016  ? Myocardial infarct [facility])   ? OSA (obstructive sleep apnea) 03/21/2016  ? uses oxygen at night time  ? Pain in right ankle and joints of right foot 03/21/2016  ? Paronychia 03/21/2016  ? Phimosis   ? Pneumonia   ? Primary insomnia 03/21/2016  ? Prostate cancer [facility]) 2008  ? Pulmonary hypertension ([place]) 12/2018  ? Moderate, Noted on ECHO  ? Tobacco dependence 03/21/2016  ? Tricuspid regurgitation 12/2018  ? Mild, Noted on ECHO  ? ? ?Past Surgical History:  ?Procedure Laterality Date  ? ANKLE SURGERY Right 12/2013  ? CARDIAC CATHETERIZATION N/A 05/08/2016  ? Procedure: Left Heart Cath and Cors/Grafts Angiography;  Surgeon: Belva Crome, MD;  Location: [facility];  Service: Cardiovascular;  Laterality: N/A;  ? CIRCUMCISION N/A 10/07/2019  ? Procedure: CIRCUMCISION ADULT;  Surgeon: Ardis Hughs, MD;  [facility];  Service: Urology;  Laterality: N/A;  ? CORONARY ARTERY BYPASS GRAFT  2006  ? x3  ? ELECTROPHYSIOLOGIC STUDY N/A 08/24/2016  ? Procedure: SVT Ablation;  Surgeon: Will Meredith Leeds, MD;  Location: [facility];  Service: Cardiovascular;  Laterality: N/A;  ? PROSTATE SURGERY  2008  ? PTCA    ? ? ?  There were no vitals filed for this visit. ? ? Subjective Assessment - 02/14/22 1256   ? ? Subjective Patient states the shoulder felt "pretty good" the day of and into the evening after last visit.  He has some tightness and return of some of the pain a few days after that.  Pain reported at 4/10.   ? Pertinent History a-fib, CAD, colon cancer, DM, MI, sleep apnea, Rt tennis elbow.   ? Diagnostic tests Dr Sheppard Coil: diagnostic US- rotator cuff tear on Lt   ? Patient Stated Goals reduce Lt shoulder pain with use, improve Lt shoulder strength, A/ROM and use   ? Pain Score 4    ? Pain Location Shoulder   ? Pain Orientation Left   ? Pain Descriptors / Indicators Aching   ? Pain Onset More than a month ago   ? Pain Frequency Intermittent    ? ?  ?  ? ?  ? ? ? ? ? ? ? ? ? ? ? ? ? ? ? ? ? ? ? ? [facility] Adult PT Treatment/Exercise - 02/14/22 0001   ? ?  ? Neck Exercises: Machines for Strengthening  ? UBE (Upper Arm Bike) 6 min (3/3) seat 10 Level 1.5- PT present to disucss progress   ?  ? Shoulder Exercises: Stretch  ? Corner Stretch 3 reps;20 seconds   did in doorway  ? Table Stretch - Flexion 5 reps;10 seconds   ? Table Stretch - Abduction 5 reps;10 seconds   ? Star Gazer Stretch 5 reps;10 seconds   ? Other Shoulder Stretches ladder climb with extra hanging stretch at top x 10 hold 5 sec each   ? Other Shoulder Stretches Hang from lat pull down x 10 hold 5 sec each   ?  ? Manual Therapy  ? Manual Therapy Passive ROM   ? Manual therapy comments Flexion, abd, ER   ? ?  ?  ? ?  ? ? ? ? ? ? ? ? ? ? ? ? PT Short Term Goals - 12/27/21 2152   ? ?  ? PT SHORT TERM GOAL #1  ? Title be independent in revised HEP   ? Time 4   ? Period Weeks   ? Status New   ? Target Date 01/24/22   ?  ? PT SHORT TERM GOAL #2  ? Title report < or = to 7/10 Lt shoulder pain with use   ? Baseline 6-7/10 max and resolves quickly   ? Time 4   ? Period Weeks   ? Status On-going   ?  ? PT SHORT TERM GOAL #3  ? Title report > or = to 40% use of Lt UE with functional tasks   ? Baseline 40%   ? Time 4   ? Period Weeks   ? Status On-going   ? Target Date 01/24/22   ?  ? PT SHORT TERM GOAL #4  ? Title demonstrate Lt shoulder A/ROM flexion to > or = to 100 degrees for overhead reaching   ? Baseline 110   ? Time 4   ? Period Weeks   ? Status On-going   ? Target Date 01/24/22   ? ?  ?  ? ?  ? ? ? ? PT Long Term Goals - 12/27/21 2204   ? ?  ? PT LONG TERM GOAL #1  ? Title be independent in advanced HEP   ? Time 8   ? Period Weeks   ?  Status On-going   ? Target Date 02/21/22   ?  ? PT LONG TERM GOAL #2  ? Title improve FOTO to > or = to 61 to improve function   ? Baseline 47 (improved from 34)   ? Time 8   ? Period Weeks   ? Status On-going   ? Target Date 02/21/22   ?  ? PT LONG TERM GOAL #3  ?  Title demonstrate > or = to Lt shoulder flexion to > or = to 120 degrees to improve reaching overhead   ? Baseline 110   ? Time 8   ? Period Weeks   ? Status On-going   ? Target Date 02/21/22   ?  ? PT LONG TERM GOAL #4  ? Title report > or = to 60% use of Lt UE for daily tasks due to improved functional strength   ? Time 8   ? Period Weeks   ? Status On-going   ? Target Date 02/21/22   ?  ? PT LONG TERM GOAL #5  ? Title report < or = to 5/10 Lt shoulder pain with reaching and lifting   ? Baseline 7/10   ? Time 8   ? Period Weeks   ? Status On-going   ? Target Date 02/21/22   ?  ? PT LONG TERM GOAL #6  ? Title demonstrate Lt UE ER to T2 to improve self-care   ? Time 8   ? Period Weeks   ? Status On-going   ? Target Date 02/21/22   ? ?  ?  ? ?  ? ? ? ? ? ? ? ? Plan - 02/14/22 1259   ? ? Clinical Impression Statement Patient is able to tolerate fairly agressive stretching but continues to have pain.  He did have some relief, however, after last visit.  He would likely benefit from increased focus on stretching.  His cert period ends next Wednesday 02/21/22.  We will re-assess next visit for DC vs. recertification.   ? Personal Factors and Comorbidities Comorbidity 2   ? Comorbidities DM, a-fib   ? Examination-Activity Limitations Sleep;Carry;Lift;Reach Overhead   ? Examination-Participation Restrictions Laundry;[place] Work   ? Stability/Clinical Decision Making Stable/Uncomplicated   ? Clinical Decision Making Moderate   ? Rehab Potential Good   ? PT Frequency 1x / week   ? PT Duration 8 weeks   ? PT Treatment/Interventions ADLs/Self Care Home Management;Cryotherapy;Electrical Stimulation;Moist Heat;Therapeutic exercise;Therapeutic activities;Functional mobility training;Patient/family education;Manual techniques;Passive range of motion;Dry needling;Taping;Neuromuscular re-education   ? PT Next Visit Plan PROM, focus on gaining ROM as well as continued RC strengthening.  Re-cert visit next visit on 02/21/22.   ? PT Home  Exercise Plan Access Code: [email or web address]   ? Consulted and Agree with Plan of Care Patient   ? ?  ?  ? ?  ? ? ?Patient will benefit from skilled therapeutic intervention in order to improve the following defici

## 2022-02-19 ENCOUNTER — Ambulatory Visit: Payer: Federal, State, Local not specified - PPO | Admitting: Cardiovascular Disease

## 2022-02-20 DIAGNOSIS — L578 Other skin changes due to chronic exposure to nonionizing radiation: Secondary | ICD-10-CM | POA: Diagnosis not present

## 2022-02-20 DIAGNOSIS — X32XXXD Exposure to sunlight, subsequent encounter: Secondary | ICD-10-CM | POA: Diagnosis not present

## 2022-02-20 DIAGNOSIS — L570 Actinic keratosis: Secondary | ICD-10-CM | POA: Diagnosis not present

## 2022-02-20 DIAGNOSIS — C44311 Basal cell carcinoma of skin of nose: Secondary | ICD-10-CM | POA: Diagnosis not present

## 2022-02-21 ENCOUNTER — Ambulatory Visit (INDEPENDENT_AMBULATORY_CARE_PROVIDER_SITE_OTHER): Payer: Federal, State, Local not specified - PPO | Admitting: Cardiovascular Disease

## 2022-02-21 ENCOUNTER — Other Ambulatory Visit: Payer: Self-pay

## 2022-02-21 ENCOUNTER — Ambulatory Visit: Payer: Federal, State, Local not specified - PPO

## 2022-02-21 ENCOUNTER — Encounter: Payer: Self-pay | Admitting: Cardiovascular Disease

## 2022-02-21 VITALS — BP 116/56 | HR 70 | Ht 67.0 in | Wt 245.6 lb

## 2022-02-21 DIAGNOSIS — M6281 Muscle weakness (generalized): Secondary | ICD-10-CM

## 2022-02-21 DIAGNOSIS — E118 Type 2 diabetes mellitus with unspecified complications: Secondary | ICD-10-CM

## 2022-02-21 DIAGNOSIS — M25612 Stiffness of left shoulder, not elsewhere classified: Secondary | ICD-10-CM | POA: Diagnosis not present

## 2022-02-21 DIAGNOSIS — G4733 Obstructive sleep apnea (adult) (pediatric): Secondary | ICD-10-CM | POA: Diagnosis not present

## 2022-02-21 DIAGNOSIS — I471 Supraventricular tachycardia: Secondary | ICD-10-CM | POA: Diagnosis not present

## 2022-02-21 DIAGNOSIS — M25512 Pain in left shoulder: Secondary | ICD-10-CM | POA: Diagnosis not present

## 2022-02-21 DIAGNOSIS — I25810 Atherosclerosis of coronary artery bypass graft(s) without angina pectoris: Secondary | ICD-10-CM | POA: Diagnosis not present

## 2022-02-21 DIAGNOSIS — G8929 Other chronic pain: Secondary | ICD-10-CM

## 2022-02-21 DIAGNOSIS — Z951 Presence of aortocoronary bypass graft: Secondary | ICD-10-CM | POA: Diagnosis not present

## 2022-02-21 NOTE — Therapy (Signed)
[facility] ?[place], [place], [address] ?Phone: [phone]   Fax:  [phone] ? ?Physical Therapy Treatment ? ?Patient Details  ?Name: Jeremiah [name] ?MRN: [id number] ?Date of Birth: Jul 29, 1946 ?Referring Provider (PT): Dorthy Cooler, Dibas/Alexander, Marylyn Ishihara, MD ? ? ?Encounter Date: 02/21/2022 ? ? PT End of Session - 02/21/22 1535   ? ? Visit Number 17   ? Date for PT Re-Evaluation 03/21/22   ? Authorization Type [id number] remaining for year   ? Authorization - Visit Number 17   ? Authorization - Number of Visits 53   ? Progress Note Due on Visit 30   ? PT Start Time 1530   ? PT Stop Time 3557   ? PT Time Calculation (min) 38 min   ? Activity Tolerance Patient tolerated treatment well   ? Behavior During Therapy [facility] for tasks assessed/performed   ? ?  ?  ? ?  ? ? ?Past Medical History:  ?Diagnosis Date  ? A-fib ([place])   ? Anemia   ? Aortic valve sclerosis 12/2018  ? Noted on ECHO  ? Arthritis   ? CAD (coronary artery disease)   ? a. 2006: CABG in 2006 with LIMA-LAD, SVG-OM1, and SVG-RPDA  ? Cardiomegaly 12/2018  ? Stable, noted on CXR  ? Carpal tunnel syndrome   ? Right  ? Chronic pain 03/21/2016  ? Colon cancer [facility]) 2006  ? COPD (chronic obstructive pulmonary disease) ([place])   ? Diabetes mellitus without complication ([place])   ? DVT (deep venous thrombosis) ([place])   ? Right  ? Essential hypertension 03/21/2016  ? GERD (gastroesophageal reflux disease)   ? History of blood transfusion   ? History of Clostridioides difficile infection   ? History of prostate cancer 03/21/2016  ? History of PSVT (paroxysmal supraventricular tachycardia)   ? HLD (hyperlipidemia)   ? HTN (hypertension)   ? Hx of CABG 2006  ? Hypercholesteremia 03/21/2016  ? Hypothyroidism   ? LAE (left atrial enlargement) 12/2018  ? Severe, Noted on ECHO  ? LVH (left ventricular hypertrophy) 12/2018  ? Mild, Noted on ECHO  ? Medication management 03/21/2016  ? Mitral annular calcification 12/2018   ? with mild MS, Noted on ECHO  ? Morbid obesity ([place]) 03/21/2016  ? Myocardial infarct [facility])   ? OSA (obstructive sleep apnea) 03/21/2016  ? uses oxygen at night time  ? Pain in right ankle and joints of right foot 03/21/2016  ? Paronychia 03/21/2016  ? Phimosis   ? Pneumonia   ? Primary insomnia 03/21/2016  ? Prostate cancer [facility]) 2008  ? Pulmonary hypertension ([place]) 12/2018  ? Moderate, Noted on ECHO  ? Tobacco dependence 03/21/2016  ? Tricuspid regurgitation 12/2018  ? Mild, Noted on ECHO  ? ? ?Past Surgical History:  ?Procedure Laterality Date  ? ANKLE SURGERY Right 12/2013  ? CARDIAC CATHETERIZATION N/A 05/08/2016  ? Procedure: Left Heart Cath and Cors/Grafts Angiography;  Surgeon: Belva Crome, MD;  Location: [place] CV LAB;  Service: Cardiovascular;  Laterality: N/A;  ? CIRCUMCISION N/A 10/07/2019  ? Procedure: CIRCUMCISION ADULT;  Surgeon: Ardis Hughs, MD;  Location: [facility];  Service: Urology;  Laterality: N/A;  ? CORONARY ARTERY BYPASS GRAFT  2006  ? x3  ? ELECTROPHYSIOLOGIC STUDY N/A 08/24/2016  ? Procedure: SVT Ablation;  Surgeon: Will Meredith Leeds, MD;  Location: [place] CV LAB;  Service: Cardiovascular;  Laterality: N/A;  ? PROSTATE SURGERY  2008  ? PTCA    ? ? ?  There were no vitals filed for this visit. ? ? Subjective Assessment - 02/21/22 1542   ? ? Subjective Patient states he is able to reach into cabinets now without having to assist his involved shoulder with his uninvolved shoulder.  He is sleeping better but admits if he rolls onto left side and sleeps for a while, he wakes with some discomfort.  However, he states, this isn't nearly as bad as before he started.  He feels he is gaining range in the left shoulder and would like to continue PT to make optimal gains in function.   ? Pertinent History a-fib, CAD, colon cancer, DM, MI, sleep apnea, Rt tennis elbow.   ? Diagnostic tests Dr Sheppard Coil: diagnostic US- rotator cuff tear on Lt   ? Patient Stated Goals reduce Lt shoulder pain with  use, improve Lt shoulder strength, A/ROM and use   ? Currently in Pain? Yes   ? Pain Score 3    ? Pain Location Shoulder   ? Pain Orientation Left   ? Pain Descriptors / Indicators Aching   ? Pain Type Chronic pain   ? Pain Onset More than a month ago   ? ?  ?  ? ?  ? ? ? ? ? [facility] PT Assessment - 02/21/22 0001   ? ?  ? Assessment  ? Medical Diagnosis Lt anterior shoulder pain/rotator cuff tear   ? Referring Provider (PT) Dorthy Cooler, Dibas/Alexander, Marylyn Ishihara, MD   ? Onset Date/Surgical Date 07/31/21   ? Hand Dominance Right   ? Prior Therapy none for shoulder   ?  ? ROM / Strength  ? AROM / PROM / Strength Strength   ?  ? AROM  ? Left Shoulder Flexion 135 Degrees   ? Left Shoulder ABduction 112 Degrees   ? Left Shoulder Internal Rotation --   to L2-L3  ? Left Shoulder External Rotation --   to T3  ?  ? Strength  ? Overall Strength Comments Scaption with ER 4-/5, Scaption with IR 3+5   ? Left Shoulder Flexion 4+/5   ? Left Shoulder ABduction 4+/5   ? Left Shoulder Internal Rotation 4+/5   ? Left Shoulder External Rotation 4/5   ? ?  ?  ? ?  ? ? ? ? ? ? ? ? ? ? ? ? ? ? ? ? [facility] Adult PT Treatment/Exercise - 02/21/22 0001   ? ?  ? Neck Exercises: Machines for Strengthening  ? UBE (Upper Arm Bike) 6 min (3/3) seat 10 Level 1.5- PT present to disucss progress   ?  ? Shoulder Exercises: Stretch  ? Corner Stretch 3 reps;20 seconds   did in doorway  ? Other Shoulder Stretches ladder climb with extra hanging stretch at top x 10 hold 5 sec each   ? Other Shoulder Stretches Hang from lat pull down x 10 hold 5 sec each   ?  ? Manual Therapy  ? Manual Therapy Passive ROM   ? Manual therapy comments Flexion, abd, ER   ? ?  ?  ? ?  ? ? ? ? ? ? ? ? ? ? ? ? PT Short Term Goals - 02/21/22 1737   ? ?  ? PT SHORT TERM GOAL #1  ? Title be independent in revised HEP   ? Time 4   ? Period Weeks   ? Status Achieved   ? Target Date 02/21/22   ?  ? PT SHORT TERM GOAL #2  ? Title  report < or = to 7/10 Lt shoulder pain with use   ? Baseline 6-7/10  max and resolves quickly   ? Time 4   ? Period Weeks   ? Status On-going   ? Target Date 03/21/22   ?  ? PT SHORT TERM GOAL #3  ? Title report > or = to 40% use of Lt UE with functional tasks   ? Baseline 40%   ? Time 4   ? Period Weeks   ? Status On-going   ? Target Date 03/21/22   ?  ? PT SHORT TERM GOAL #4  ? Title demonstrate Lt shoulder A/ROM flexion to > or = to 100 degrees for overhead reaching   ? Baseline 110   ? Time 4   ? Period Weeks   ? Status Achieved   ? Target Date 02/21/22   ? ?  ?  ? ?  ? ? ? ? PT Long Term Goals - 02/21/22 1739   ? ?  ? PT LONG TERM GOAL #1  ? Title be independent in advanced HEP   ? Time 4   ? Status On-going   ? Target Date 03/21/22   ?  ? PT LONG TERM GOAL #2  ? Title improve FOTO to > or = to 61 to improve function   ? Baseline 49 (improved from 47)   ? Time 4   ? Period Weeks   ? Status On-going   ? Target Date 03/21/22   ?  ? PT LONG TERM GOAL #3  ? Title demonstrate > or = to Lt shoulder flexion to > or = to 120 degrees to improve reaching overhead   ? Time 8   ? Period Weeks   ? Status Achieved   ? Target Date 02/21/22   ?  ? PT LONG TERM GOAL #4  ? Title report > or = to 60% use of Lt UE for daily tasks due to improved functional strength   ? Baseline 70% use with daily tasks   ? Time 8   ? Period Weeks   ? Status Achieved   ? Target Date 02/21/22   ?  ? PT LONG TERM GOAL #5  ? Title report < or = to 5/10 Lt shoulder pain with reaching and lifting   ? Baseline 7/10   ? Time 4   ? Period Weeks   ? Status On-going   ? Target Date 03/21/22   ?  ? PT LONG TERM GOAL #6  ? Title demonstrate Lt UE ER to T2 to improve self-care   ? Baseline lateral to T2   ? Time 8   ? Period Weeks   ? Status Achieved   ? Target Date 02/21/22   ? ?  ?  ? ?  ? ? ? ? ? ? ? ? Plan - 02/21/22 1611   ? ? Clinical Impression Statement Anita has shown good progress with ROM and overall strength which has allowed him to have more functional use of his left shoulder for overhead activities.  His compliance  has improved with HEP.  Pain level is more controlled.  He still lacks quite a bit of IR.  He would benefit from continued skilled PT to regain full function of his left shoulder.   ? Personal Factors

## 2022-02-21 NOTE — Progress Notes (Signed)
? ?Cardiology Office Note   ? ?Date:  02/27/2022  ? ?ID:  Jeremiah [name], DOB August 21, 1946, MRN [id number] ? ?PCP:  Jeremiah Amel, MD  ?Cardiologist:  Jeremiah Majestic, MD (sleep); Jeremiah [name] ? ?New sleep evaluation ? ? ?History of Present Illness:  ?Jeremiah [name] is a 76 y.o. male who is followed by Jeremiah [name] for cardiology care.  He has a history of CAD and suffered a myocardial infarction in 2006.  He underwent CABG revascularization surgery with a LIMA to LAD, SVG to OM1, and SVG to right posterior descending artery.  While living in [place] in 2015 nuclear study raise concern for reversible ischemia and repeat catheterization showed patent grafts.  Thank you ? ?He has a history of obesity, hypertension, type 2 diabetes mellitus, hypothyroidism, GERD.  He was reportedly initially diagnosed with severe obstructive sleep apnea in 2016 at [place].  Reportedly BiPAP was recommended at initial setting of 19 over 14 cm of water pressure.  Apparently he had not been using any therapy for many years.  Due to concerns for obstructive significant obstructive sleep apnea he was referred for reevaluation at the [facility] health sleep disorder center on February 24, 2021.  He met split-night criteria and was found to have moderate overall sleep apnea with an AHI of 26.5/h and RDI of 31.4/h.  However, events were very severe with supine sleep with an AHI of 73.6/h.  During the titration period, he was titrated up to 14 cm of water pressure and was on 3 L of O2 for the study. ? ?Due to supply chain issues, he did not receive his new ResMed air sense 11 AutoSet CPAP unit until November 06, 2021.  A download was obtained in the office today from November 06, 2021 through February 21, 2022.  He only had 1 day of use for 1 hour and 34 minutes.  He states that he has been bothered with left shoulder discomfort which has created constant pain making it difficult to sleep.  As result he did not use CPAP therapy.  He typically goes to bed at 9 PM but often is  up at 1 AM does not sleep well thereafter and gets up around 4:30 AM.  He admits to snoring.  His sleep is nonrestorative.  He has frequent awakenings.  He has nocturia several times per night.  Epworth scale was calculated in the office today and this endorsed at 9 as shown below:: ? ? ?Epworth Sleepiness Scale: ?Situation   Chance of Dozing/Sleeping (0 = never , 1 = slight chance , 2 = moderate chance , 3 = high chance )  ? sitting and reading 1  ? watching TV 2  ? sitting inactive in a public place 2  ? being a passenger in a motor vehicle for an hour or more 3  ? lying down in the afternoon 1  ? sitting and talking to someone 0  ? sitting quietly after lunch (no alcohol) 0  ? while stopped for a few minutes in traffic as the driver 0  ? Total Score  9  ?  ?He is unaware of any bruxism, restless legs, hypnagogic hallucinations or cataplectic events.  He presents for his initial sleep evaluation. ? ? ?Past Medical History:  ?Diagnosis Date  ? A-fib ([place])   ? Anemia   ? Aortic valve sclerosis 12/2018  ? Noted on ECHO  ? Arthritis   ? CAD (coronary artery disease)   ? a. 2006: CABG in 2006 with  LIMA-LAD, SVG-OM1, and SVG-RPDA  ? Cardiomegaly 12/2018  ? Stable, noted on CXR  ? Carpal tunnel syndrome   ? Right  ? Chronic pain 03/21/2016  ? Colon cancer [facility]) 2006  ? COPD (chronic obstructive pulmonary disease) ([place])   ? Diabetes mellitus without complication ([place])   ? DVT (deep venous thrombosis) ([place])   ? Right  ? Essential hypertension 03/21/2016  ? GERD (gastroesophageal reflux disease)   ? History of blood transfusion   ? History of Clostridioides difficile infection   ? History of prostate cancer 03/21/2016  ? History of PSVT (paroxysmal supraventricular tachycardia)   ? HLD (hyperlipidemia)   ? HTN (hypertension)   ? Hx of CABG 2006  ? Hypercholesteremia 03/21/2016  ? Hypothyroidism   ? LAE (left atrial enlargement) 12/2018  ? Severe, Noted on ECHO  ? LVH (left ventricular hypertrophy) 12/2018  ? Mild, Noted on ECHO   ? Medication management 03/21/2016  ? Mitral annular calcification 12/2018  ? with mild MS, Noted on ECHO  ? Morbid obesity ([place]) 03/21/2016  ? Myocardial infarct [facility])   ? OSA (obstructive sleep apnea) 03/21/2016  ? uses oxygen at night time  ? Pain in right ankle and joints of right foot 03/21/2016  ? Paronychia 03/21/2016  ? Phimosis   ? Pneumonia   ? Primary insomnia 03/21/2016  ? Prostate cancer [facility]) 2008  ? Pulmonary hypertension ([place]) 12/2018  ? Moderate, Noted on ECHO  ? Tobacco dependence 03/21/2016  ? Tricuspid regurgitation 12/2018  ? Mild, Noted on ECHO  ? ? ?Past Surgical History:  ?Procedure Laterality Date  ? ANKLE SURGERY Right 12/2013  ? CARDIAC CATHETERIZATION N/A 05/08/2016  ? Procedure: Left Heart Cath and Cors/Grafts Angiography;  Surgeon: Belva Crome, MD;  Location: [place] CV LAB;  Service: Cardiovascular;  Laterality: N/A;  ? CIRCUMCISION N/A 10/07/2019  ? Procedure: CIRCUMCISION ADULT;  Surgeon: Ardis Hughs, MD;  [facility];  Service: Urology;  Laterality: N/A;  ? CORONARY ARTERY BYPASS GRAFT  2006  ? x3  ? ELECTROPHYSIOLOGIC STUDY N/A 08/24/2016  ? Procedure: SVT Ablation;  Surgeon: Will Meredith Leeds, MD;  Location: [place] CV LAB;  Service: Cardiovascular;  Laterality: N/A;  ? PROSTATE SURGERY  2008  ? PTCA    ? ? ?Current Medications: ?Outpatient Medications Prior to Visit  ?Medication Sig Dispense Refill  ? albuterol (PROVENTIL) (2.5 MG/3ML) 0.083% nebulizer solution Take 3 mLs (2.5 mg total) by nebulization every 4 (four) hours as needed for wheezing or shortness of breath. 75 mL 12  ? arformoterol (BROVANA) 15 MCG/2ML NEBU Take 2 mLs (15 mcg total) by nebulization 2 (two) times daily. 120 mL 11  ? aspirin 81 MG EC tablet Take 81 mg by mouth daily.    ? atorvastatin (LIPITOR) 80 MG tablet Take 80 mg by mouth daily.    ? bisoprolol (ZEBETA) 5 MG tablet TAKE 1 TABLET(5 MG) BY MOUTH DAILY 90 tablet 3  ? colchicine 0.6 MG tablet Take 0.6 mg by mouth as needed.    ?  empagliflozin (JARDIANCE) 25 MG TABS tablet Take 1 tablet (25 mg total) by mouth daily before breakfast. 30 tablet 6  ? ezetimibe (ZETIA) 10 MG tablet TAKE 1 TABLET(10 MG) BY MOUTH DAILY 90 tablet 2  ? FEROSUL 325 (65 Fe) MG tablet TAKE 1 TABLET BY MOUTH DAILY WITH BREAKFAST 30 tablet 5  ? furosemide (LASIX) 40 MG tablet Take 1 tablet (40 mg total) by mouth daily. 135 tablet 1  ?  glimepiride (AMARYL) 4 MG tablet Take 4 mg by mouth daily with breakfast.    ? levothyroxine (SYNTHROID, LEVOTHROID) 88 MCG tablet Take 88 mcg by mouth daily before breakfast.    ? losartan (COZAAR) 25 MG tablet Take 25 mg by mouth daily.    ? metFORMIN (GLUCOPHAGE-XR) 500 MG 24 hr tablet Take 2,000 mg by mouth daily.    ? OXYGEN Inhale 3 L into the lungs at bedtime. 4 lpm with 24/7 ?[facility]    ? pantoprazole (PROTONIX) 40 MG tablet Take 40 mg by mouth daily.    ? revefenacin (YUPELRI) 175 MCG/3ML nebulizer solution Inhale 3 mLs (175 mcg total) into the lungs every morning. 90 mL 11  ? revefenacin (YUPELRI) 175 MCG/3ML nebulizer solution Take 3 mLs (175 mcg total) by nebulization daily. 90 mL 0  ? ?No facility-administered medications prior to visit.  ?  ? ?Allergies:   Shellfish allergy, Empagliflozin, and Fluzone [influenza virus vaccine]  ? ?Social History  ? ?Socioeconomic History  ? Marital status: Married  ?  Spouse name: Not on file  ? Number of children: 0  ? Years of education: Not on file  ? Highest education level: Not on file  ?Occupational History  ? Occupation: retired - Passenger transport manager  ?Tobacco Use  ? Smoking status: Former  ?  Packs/day: 0.50  ?  Years: 60.00  ?  Pack years: 30.00  ?  Types: Cigarettes  ?  Quit date: 12/15/2016  ?  Years since quitting: 5.2  ? Smokeless tobacco: Never  ?Vaping Use  ? Vaping Use: Never used  ?Substance and Sexual Activity  ? Alcohol use: Not Currently  ?  Comment: very seldom  ? Drug use: No  ? Sexual activity: Not on file  ?Other Topics Concern  ? Not on file  ?Social History Narrative  ? Retired,  married, no children  ?   ? epworth sleepiness scale = 9 (04/13/16) (has OSA)  ? ?Social Determinants of Health  ? ?Financial Resource Strain: Not on file  ?Food Insecurity: Not on file  ?Transportation Needs

## 2022-02-21 NOTE — Patient Instructions (Signed)
Medication Instructions:  ?Your physician recommends that you continue on your current medications as directed. Please refer to the Current Medication list given to you today. ? ?*If you need a refill on your cardiac medications before your next appointment, please call your pharmacy* ? ? ?Follow-Up: ?At [facility], you and your health needs are our priority.  As part of our continuing mission to provide you with exceptional heart care, we have created designated Provider Care Teams.  These Care Teams include your primary Cardiologist (physician) and Advanced Practice Providers (APPs -  Physician Assistants and Nurse Practitioners) who all work together to provide you with the care you need, when you need it. ? ?We recommend signing up for the patient portal called "MyChart".  Sign up information is provided on this After Visit Summary.  MyChart is used to connect with patients for Virtual Visits (Telemedicine).  Patients are able to view lab/test results, encounter notes, upcoming appointments, etc.  Non-urgent messages can be sent to your provider as well.   ?To learn more about what you can do with MyChart, go to [email or web address].   ? ?Your next appointment:   ?To be determined. ? ? ?Provider:   ?Shelva Majestic, MD ? ? ?Other Instructions ?Sleep Clinic ? ?

## 2022-02-27 ENCOUNTER — Encounter: Payer: Self-pay | Admitting: Cardiovascular Disease

## 2022-03-01 ENCOUNTER — Ambulatory Visit: Payer: Federal, State, Local not specified - PPO

## 2022-03-07 ENCOUNTER — Ambulatory Visit: Payer: Federal, State, Local not specified - PPO | Attending: Family Medicine

## 2022-03-07 DIAGNOSIS — M25512 Pain in left shoulder: Secondary | ICD-10-CM | POA: Diagnosis not present

## 2022-03-07 DIAGNOSIS — M25612 Stiffness of left shoulder, not elsewhere classified: Secondary | ICD-10-CM | POA: Diagnosis not present

## 2022-03-07 DIAGNOSIS — G8929 Other chronic pain: Secondary | ICD-10-CM | POA: Diagnosis not present

## 2022-03-07 DIAGNOSIS — M6281 Muscle weakness (generalized): Secondary | ICD-10-CM | POA: Diagnosis not present

## 2022-03-07 NOTE — Patient Instructions (Signed)
Suggested patient focus on ROM daily and consistently ?

## 2022-03-07 NOTE — Therapy (Signed)
[facility] ?[place], [place], [address] ?Phone: [phone]   Fax:  [phone] ? ?Physical Therapy Treatment ? ?Patient Details  ?Name: Jeremiah [name] ?MRN: [id number] ?Date of Birth: Mar 09, 1946 ?Referring Provider (PT): Dorthy Cooler, Dibas/Alexander, Marylyn Ishihara, MD ? ? ?Encounter Date: 03/07/2022 ? ? PT End of Session - 03/07/22 1539   ? ? Visit Number 18   ? Date for PT Re-Evaluation 03/21/22   ? Authorization Type [employer] Federal-53 remaining for year   ? Authorization - Visit Number 18   ? Authorization - Number of Visits 53   ? Progress Note Due on Visit 30   ? PT Start Time 1530   ? PT Stop Time 0160   ? PT Time Calculation (min) 35 min   ? Activity Tolerance Patient tolerated treatment well   ? Behavior During Therapy [facility] for tasks assessed/performed   ? ?  ?  ? ?  ? ? ?Past Medical History:  ?Diagnosis Date  ? A-fib ([place])   ? Anemia   ? Aortic valve sclerosis 12/2018  ? Noted on ECHO  ? Arthritis   ? CAD (coronary artery disease)   ? a. 2006: CABG in 2006 with LIMA-LAD, SVG-OM1, and SVG-RPDA  ? Cardiomegaly 12/2018  ? Stable, noted on CXR  ? Carpal tunnel syndrome   ? Right  ? Chronic pain 03/21/2016  ? Colon cancer [facility]) 2006  ? COPD (chronic obstructive pulmonary disease) ([place])   ? Diabetes mellitus without complication ([place])   ? DVT (deep venous thrombosis) ([place])   ? Right  ? Essential hypertension 03/21/2016  ? GERD (gastroesophageal reflux disease)   ? History of blood transfusion   ? History of Clostridioides difficile infection   ? History of prostate cancer 03/21/2016  ? History of PSVT (paroxysmal supraventricular tachycardia)   ? HLD (hyperlipidemia)   ? HTN (hypertension)   ? Hx of CABG 2006  ? Hypercholesteremia 03/21/2016  ? Hypothyroidism   ? LAE (left atrial enlargement) 12/2018  ? Severe, Noted on ECHO  ? LVH (left ventricular hypertrophy) 12/2018  ? Mild, Noted on ECHO  ? Medication management 03/21/2016  ? Mitral annular calcification 12/2018  ?  with mild MS, Noted on ECHO  ? Morbid obesity ([place]) 03/21/2016  ? Myocardial infarct [facility])   ? OSA (obstructive sleep apnea) 03/21/2016  ? uses oxygen at night time  ? Pain in right ankle and joints of right foot 03/21/2016  ? Paronychia 03/21/2016  ? Phimosis   ? Pneumonia   ? Primary insomnia 03/21/2016  ? Prostate cancer [facility] 2008  ? Pulmonary hypertension ([place]) 12/2018  ? Moderate, Noted on ECHO  ? Tobacco dependence 03/21/2016  ? Tricuspid regurgitation 12/2018  ? Mild, Noted on ECHO  ? ? ?Past Surgical History:  ?Procedure Laterality Date  ? ANKLE SURGERY Right 12/2013  ? CARDIAC CATHETERIZATION N/A 05/08/2016  ? Procedure: Left Heart Cath and Cors/Grafts Angiography;  Surgeon: Belva Crome, MD;  Location: [place] [facility];  Service: Cardiovascular;  Laterality: N/A;  ? CIRCUMCISION N/A 10/07/2019  ? Procedure: CIRCUMCISION ADULT;  Surgeon: Ardis Hughs, MD;  Location: [facility];  Service: Urology;  Laterality: N/A;  ? CORONARY ARTERY BYPASS GRAFT  2006  ? x3  ? ELECTROPHYSIOLOGIC STUDY N/A 08/24/2016  ? Procedure: SVT Ablation;  Surgeon: Will Meredith Leeds, MD;  Location: [place] CV LAB;  Service: Cardiovascular;  Laterality: N/A;  ? PROSTATE SURGERY  2008  ? PTCA    ? ? ?  There were no vitals filed for this visit. ? ? Subjective Assessment - 03/07/22 1540   ? ? Subjective Patient states he hasn't done much since last visit.  He was doing some touch up painting at his home.  He didn't have any increased pain after doing this activity.   ? Pertinent History a-fib, CAD, colon cancer, DM, MI, sleep apnea, Rt tennis elbow.   ? Diagnostic tests Dr Sheppard Coil: diagnostic US- rotator cuff tear on Lt   ? Patient Stated Goals reduce Lt shoulder pain with use, improve Lt shoulder strength, A/ROM and use   ? Currently in Pain? Yes   ? Pain Score 1    ? Pain Location Shoulder   ? Pain Orientation Left   ? Pain Descriptors / Indicators Aching   ? Pain Type Chronic pain   ? Pain Onset More than a month ago   ? ?  ?  ? ?   ? ? ? ? ? ? ? ? ? ? ? ? ? ? ? ? ? ? ? ? [facility] PT Treatment/Exercise - 03/07/22 0001   ? ?  ? Neck Exercises: Machines for Strengthening  ? UBE (Upper Arm Bike) 6 min (3/3) seat 10 Level 1- PT present to disucss progress   ?  ? Shoulder Exercises: Stretch  ? Corner Stretch 3 reps;20 seconds   did in doorway  ? External Rotation Stretch 5 reps;10 seconds   at doorway  ? Other Shoulder Stretches ladder climb with extra hanging stretch at top x 10 hold 5 sec each   ? Other Shoulder Stretches Hang from lat pull down x 10 hold 5 sec each   ?  ? Manual Therapy  ? Manual Therapy Passive ROM   ? Manual therapy comments Flexion, abd, ER and IR x 10 each holding 10 sec each   ? ?  ?  ? ?  ? ? ? ? ? ? ? ? ? ? ? ? PT Short Term Goals - 02/21/22 1737   ? ?  ? PT SHORT TERM GOAL #1  ? Title be independent in revised HEP   ? Time 4   ? Period Weeks   ? Status Achieved   ? Target Date 02/21/22   ?  ? PT SHORT TERM GOAL #2  ? Title report < or = to 7/10 Lt shoulder pain with use   ? Baseline 6-7/10 max and resolves quickly   ? Time 4   ? Period Weeks   ? Status On-going   ? Target Date 03/21/22   ?  ? PT SHORT TERM GOAL #3  ? Title report > or = to 40% use of Lt UE with functional tasks   ? Baseline 40%   ? Time 4   ? Period Weeks   ? Status On-going   ? Target Date 03/21/22   ?  ? PT SHORT TERM GOAL #4  ? Title demonstrate Lt shoulder A/ROM flexion to > or = to 100 degrees for overhead reaching   ? Baseline 110   ? Time 4   ? Period Weeks   ? Status Achieved   ? Target Date 02/21/22   ? ?  ?  ? ?  ? ? ? ? PT Long Term Goals - 02/21/22 1739   ? ?  ? PT LONG TERM GOAL #1  ? Title be independent in advanced HEP   ? Time 4   ? Status On-going   ? Target  Date 03/21/22   ?  ? PT LONG TERM GOAL #2  ? Title improve FOTO to > or = to 61 to improve function   ? Baseline 49 (improved from 47)   ? Time 4   ? Period Weeks   ? Status On-going   ? Target Date 03/21/22   ?  ? PT LONG TERM GOAL #3  ? Title demonstrate > or = to Lt shoulder  flexion to > or = to 120 degrees to improve reaching overhead   ? Time 8   ? Period Weeks   ? Status Achieved   ? Target Date 02/21/22   ?  ? PT LONG TERM GOAL #4  ? Title report > or = to 60% use of Lt UE for daily tasks due to improved functional strength   ? Baseline 70% use with daily tasks   ? Time 8   ? Period Weeks   ? Status Achieved   ? Target Date 02/21/22   ?  ? PT LONG TERM GOAL #5  ? Title report < or = to 5/10 Lt shoulder pain with reaching and lifting   ? Baseline 7/10   ? Time 4   ? Period Weeks   ? Status On-going   ? Target Date 03/21/22   ?  ? PT LONG TERM GOAL #6  ? Title demonstrate Lt UE ER to T2 to improve self-care   ? Baseline lateral to T2   ? Time 8   ? Period Weeks   ? Status Achieved   ? Target Date 02/21/22   ? ?  ?  ? ?  ? ? ? ? ? ? ? ? Plan - 03/07/22 1607   ? ? Clinical Impression Statement Adir is progreesing appropriately.  He is only intermittantly compliant with his HEP but is able to reproduce this.  He is approaching max rehab potential due to his refusal to do cortisone injections.  He has 2 visits left.  We will likely DC at that time.   ? Personal Factors and Comorbidities Comorbidity 2   ? Comorbidities DM, a-fib   ? Examination-Activity Limitations Sleep;Carry;Lift;Reach Overhead   ? Examination-Participation Restrictions Laundry;Valla Leaver Work   ? Stability/Clinical Decision Making Stable/Uncomplicated   ? Clinical Decision Making Moderate   ? Rehab Potential Good   ? PT Frequency 1x / week   ? PT Duration 4 weeks   ? PT Treatment/Interventions ADLs/Self Care Home Management;Cryotherapy;Electrical Stimulation;Moist Heat;Therapeutic exercise;Therapeutic activities;Functional mobility training;Patient/family education;Manual techniques;Passive range of motion;Dry needling;Taping;Neuromuscular re-education   ? PT Next Visit Plan PROM, focus on gaining ROM as well as continued RC strengthening.   ? PT Home Exercise Plan Access Code: [email or web address]   ? Consulted and Agree with Plan of  Care Patient   ? ?  ?  ? ?  ? ? ?Patient will benefit from skilled therapeutic intervention in order to improve the following deficits and impairments:  Decreased activity tolerance, Decreased range of mot

## 2022-03-14 ENCOUNTER — Ambulatory Visit: Payer: Federal, State, Local not specified - PPO

## 2022-03-14 DIAGNOSIS — M25612 Stiffness of left shoulder, not elsewhere classified: Secondary | ICD-10-CM

## 2022-03-14 DIAGNOSIS — G8929 Other chronic pain: Secondary | ICD-10-CM | POA: Diagnosis not present

## 2022-03-14 DIAGNOSIS — M25512 Pain in left shoulder: Secondary | ICD-10-CM | POA: Diagnosis not present

## 2022-03-14 DIAGNOSIS — M6281 Muscle weakness (generalized): Secondary | ICD-10-CM | POA: Diagnosis not present

## 2022-03-14 NOTE — Therapy (Signed)
[facility] ?[place], [place], [address] ?Phone: [phone]   Fax:  [phone] ? ?Physical Therapy Discharge ? ?Patient Details  ?Name: Jeremiah [name] ?MRN: [id number] ?Date of Birth: [birth year] ?Referring Provider (PT): Dorthy Cooler, Dibas/Alexander, Marylyn Ishihara, MD ? ? ?Encounter Date: 03/14/2022 ? ? PT End of Session - 03/14/22 1537   ? ? Visit Number 19   ? Date for PT Re-Evaluation 03/21/22   ? Authorization Type [employer] Federal-53 remaining for year   ? Authorization - Visit Number 19   ? Authorization - Number of Visits 53   ? Progress Note Due on Visit 30   ? PT Start Time 1530   ? PT Stop Time 1610   ? PT Time Calculation (min) 40 min   ? Activity Tolerance Patient tolerated treatment well   ? Behavior During Therapy [facility] for tasks assessed/performed   ? ?  ?  ? ?  ? ? ?Past Medical History:  ?Diagnosis Date  ? A-fib ([place])   ? Anemia   ? Aortic valve sclerosis 12/2018  ? Noted on ECHO  ? Arthritis   ? CAD (coronary artery disease)   ? a. 2006: CABG in 2006 with LIMA-LAD, SVG-OM1, and SVG-RPDA  ? Cardiomegaly 12/2018  ? Stable, noted on CXR  ? Carpal tunnel syndrome   ? Right  ? Chronic pain 03/21/2016  ? Colon cancer [facility]) 2006  ? COPD (chronic obstructive pulmonary disease) ([place])   ? Diabetes mellitus without complication ([place])   ? DVT (deep venous thrombosis) ([place])   ? Right  ? Essential hypertension 03/21/2016  ? GERD (gastroesophageal reflux disease)   ? History of blood transfusion   ? History of Clostridioides difficile infection   ? History of prostate cancer 03/21/2016  ? History of PSVT (paroxysmal supraventricular tachycardia)   ? HLD (hyperlipidemia)   ? HTN (hypertension)   ? Hx of CABG 2006  ? Hypercholesteremia 03/21/2016  ? Hypothyroidism   ? LAE (left atrial enlargement) 12/2018  ? Severe, Noted on ECHO  ? LVH (left ventricular hypertrophy) 12/2018  ? Mild, Noted on ECHO  ? Medication management 03/21/2016  ? Mitral annular calcification 12/2018   ? with mild MS, Noted on ECHO  ? Morbid obesity ([place]) 03/21/2016  ? Myocardial infarct [facility])   ? OSA (obstructive sleep apnea) 03/21/2016  ? uses oxygen at night time  ? Pain in right ankle and joints of right foot 03/21/2016  ? Paronychia 03/21/2016  ? Phimosis   ? Pneumonia   ? Primary insomnia 03/21/2016  ? Prostate cancer [facility]) 2008  ? Pulmonary hypertension ([place]) 12/2018  ? Moderate, Noted on ECHO  ? Tobacco dependence 03/21/2016  ? Tricuspid regurgitation 12/2018  ? Mild, Noted on ECHO  ? ? ?Past Surgical History:  ?Procedure Laterality Date  ? ANKLE SURGERY Right 12/2013  ? CARDIAC CATHETERIZATION N/A 05/08/2016  ? Procedure: Left Heart Cath and Cors/Grafts Angiography;  Surgeon: Belva Crome, MD;  Location: [place] [facility] LAB;  Service: Cardiovascular;  Laterality: N/A;  ? CIRCUMCISION N/A 10/07/2019  ? Procedure: CIRCUMCISION ADULT;  Surgeon: Ardis Hughs, MD;  [facility];  Service: Urology;  Laterality: N/A;  ? CORONARY ARTERY BYPASS GRAFT  2006  ? x3  ? ELECTROPHYSIOLOGIC STUDY N/A 08/24/2016  ? Procedure: SVT Ablation;  Surgeon: Will Meredith Leeds, MD;  Location: [place] [facility];  Service: Cardiovascular;  Laterality: N/A;  ? PROSTATE SURGERY  2008  ? PTCA    ? ? ?  There were no vitals filed for this visit. ? ? Subjective Assessment - 03/14/22 1611   ? ? Subjective Patient states he is "about the same".  Gets pain with stretching but then this subsides.  Overall, able to do more functionally but still having pain with trying to do heavier overhead activity.   ? Pertinent History a-fib, CAD, colon cancer, DM, MI, sleep apnea, Rt tennis elbow.   ? Diagnostic tests Dr Sheppard Coil: diagnostic US- rotator cuff tear on Lt   ? Patient Stated Goals reduce Lt shoulder pain with use, improve Lt shoulder strength, A/ROM and use   ? Currently in Pain? Yes   ? Pain Score 2    ? Pain Location Shoulder   ? Pain Orientation Left   ? Pain Descriptors / Indicators Aching   ? Pain Type Chronic pain   ? Pain Onset  More than a month ago   ? Pain Frequency Intermittent   ? ?  ?  ? ?  ? ? ? ? ? [facility] PT Assessment - 03/14/22 0001   ? ?  ? Assessment  ? Medical Diagnosis Lt anterior shoulder pain/rotator cuff tear   ? Referring Provider (PT) Dorthy Cooler, Dibas/Alexander, Marylyn Ishihara, MD   ? Onset Date/Surgical Date 07/31/21   ? Hand Dominance Right   ? Prior Therapy none for shoulder   ?  ? Observation/Other Assessments  ? Focus on Therapeutic Outcomes (FOTO)  59   ?  ? ROM / Strength  ? AROM / PROM / Strength Strength   ?  ? AROM  ? Left Shoulder Flexion 146 Degrees   ? Left Shoulder ABduction 110 Degrees   ? Left Shoulder Internal Rotation --   To L1-L2  ? Left Shoulder External Rotation --   To T3-T4  ?  ? Strength  ? Overall Strength Comments Scaption with ER 4+/5, Scaption with IR 4/5   ? Left Shoulder Flexion 4+/5   ? Left Shoulder ABduction 4+/5   ? Left Shoulder Internal Rotation 4+/5   ? Left Shoulder External Rotation 4+/5   ? ?  ?  ? ?  ? ? ? ? ? ? ? ? ? ? ? ? ? ? ? ? ? ? ? ? ? ? ? ? ? ? ? PT Short Term Goals - 03/14/22 1708   ? ?  ? PT SHORT TERM GOAL #1  ? Title be independent in revised HEP   ? Time 4   ? Period Weeks   ? Status Achieved   ? Target Date 02/21/22   ?  ? PT SHORT TERM GOAL #2  ? Title report < or = to 7/10 Lt shoulder pain with use   ? Baseline 6-7/10 max and resolves quickly   ? Time 4   ? Period Weeks   ? Status Achieved   ? Target Date 03/21/22   ?  ? PT SHORT TERM GOAL #3  ? Title report > or = to 40% use of Lt UE with functional tasks   ? Baseline 40%   ? Time 4   ? Period Weeks   ? Status Achieved   ? Target Date 03/21/22   ?  ? PT SHORT TERM GOAL #4  ? Title demonstrate Lt shoulder A/ROM flexion to > or = to 100 degrees for overhead reaching   ? Baseline 110   ? Time 4   ? Period Weeks   ? Status Achieved   ? Target Date 02/21/22   ? ?  ?  ? ?  ? ? ? ?  PT Long Term Goals - 03/14/22 1709   ? ?  ? PT LONG TERM GOAL #1  ? Title be independent in advanced HEP   ? Time 4   ? Period Weeks   ? Status Achieved   ?  Target Date 03/21/22   ?  ? PT LONG TERM GOAL #2  ? Title improve FOTO to > or = to 61 to improve function   ? Baseline Improved to 59   ? Time 4   ? Period Weeks   ? Status On-going   ? Target Date 03/21/22   ?  ? PT LONG TERM GOAL #3  ? Title demonstrate > or = to Lt shoulder flexion to > or = to 120 degrees to improve reaching overhead   ? Baseline 110   ? Time 8   ? Period Weeks   ? Status Achieved   ? Target Date 02/21/22   ?  ? PT LONG TERM GOAL #4  ? Title report > or = to 60% use of Lt UE for daily tasks due to improved functional strength   ? Baseline 70% use with daily tasks   ? Time 8   ? Period Weeks   ? Status Achieved   ? Target Date 02/21/22   ?  ? PT LONG TERM GOAL #5  ? Title report < or = to 5/10 Lt shoulder pain with reaching and lifting   ? Baseline 7/10   ? Time 4   ? Period Weeks   ? Status Partially Met   ? Target Date 03/21/22   ?  ? PT LONG TERM GOAL #6  ? Title demonstrate Lt UE ER to T2 to improve self-care   ? Baseline lateral to T2   ? Time 8   ? Period Weeks   ? Status Achieved   ? Target Date 02/21/22   ? ?  ?  ? ?  ? ? ?PHYSICAL THERAPY DISCHARGE SUMMARY ? ?Visits from Start of Care: 19 ? ?Current functional level related to goals / functional outcomes: ?See above ?  ?Remaining deficits: ?See above ?  ?Education / Equipment: ?See above  ? ?Patient agrees to discharge. Patient goals were  all met with exception of pain . Patient is being discharged due to being pleased with the current functional level.  ? ? ? ? ? Plan - 03/14/22 1615   ? ? Clinical Impression Statement Yazid is not making significant progress overall functionally in the past month.  He has reached a plateau.  He is somewhat compliant with his HEP.  He has refused any injections to help control inflammation and cut down on scar tissue production.  He should be able to maintain functional use of the left UE if he is diligent with his HEP.   ? Personal Factors and Comorbidities Comorbidity 2   ? Comorbidities DM, a-fib    ? Examination-Activity Limitations Sleep;Carry;Lift;Reach Overhead   ? Examination-Participation Restrictions Laundry;Valla Leaver Work   ? Stability/Clinical Decision Making Stable/Uncomplicated   ? Clinical Tennis Must

## 2022-03-14 NOTE — Patient Instructions (Signed)
Reviewed HEP.    Instructed in DC plan.   ?

## 2022-03-20 DIAGNOSIS — L570 Actinic keratosis: Secondary | ICD-10-CM | POA: Diagnosis not present

## 2022-03-20 DIAGNOSIS — C44319 Basal cell carcinoma of skin of other parts of face: Secondary | ICD-10-CM | POA: Diagnosis not present

## 2022-03-20 DIAGNOSIS — X32XXXD Exposure to sunlight, subsequent encounter: Secondary | ICD-10-CM | POA: Diagnosis not present

## 2022-04-18 DIAGNOSIS — H524 Presbyopia: Secondary | ICD-10-CM | POA: Diagnosis not present

## 2022-04-18 DIAGNOSIS — H2513 Age-related nuclear cataract, bilateral: Secondary | ICD-10-CM | POA: Diagnosis not present

## 2022-04-18 DIAGNOSIS — E119 Type 2 diabetes mellitus without complications: Secondary | ICD-10-CM | POA: Diagnosis not present

## 2022-04-18 DIAGNOSIS — H43813 Vitreous degeneration, bilateral: Secondary | ICD-10-CM | POA: Diagnosis not present

## 2022-04-20 ENCOUNTER — Other Ambulatory Visit: Payer: Self-pay | Admitting: Internal Medicine

## 2022-04-23 DIAGNOSIS — M109 Gout, unspecified: Secondary | ICD-10-CM | POA: Diagnosis not present

## 2022-04-23 DIAGNOSIS — E1169 Type 2 diabetes mellitus with other specified complication: Secondary | ICD-10-CM | POA: Diagnosis not present

## 2022-04-25 ENCOUNTER — Telehealth: Payer: Self-pay | Admitting: Internal Medicine

## 2022-04-25 NOTE — Telephone Encounter (Signed)
Message sent to [facility] staff letting them know there was a call asking for notes. Notes faxed to number given as well. Nothing further needed,

## 2022-04-26 DIAGNOSIS — E7800 Pure hypercholesterolemia, unspecified: Secondary | ICD-10-CM | POA: Diagnosis not present

## 2022-04-26 DIAGNOSIS — E1169 Type 2 diabetes mellitus with other specified complication: Secondary | ICD-10-CM | POA: Diagnosis not present

## 2022-05-03 ENCOUNTER — Other Ambulatory Visit: Payer: Self-pay | Admitting: Internal Medicine

## 2022-05-09 DIAGNOSIS — L538 Other specified erythematous conditions: Secondary | ICD-10-CM | POA: Diagnosis not present

## 2022-05-15 DIAGNOSIS — Z08 Encounter for follow-up examination after completed treatment for malignant neoplasm: Secondary | ICD-10-CM | POA: Diagnosis not present

## 2022-05-15 DIAGNOSIS — Z85828 Personal history of other malignant neoplasm of skin: Secondary | ICD-10-CM | POA: Diagnosis not present

## 2022-05-15 DIAGNOSIS — S30860A Insect bite (nonvenomous) of lower back and pelvis, initial encounter: Secondary | ICD-10-CM | POA: Diagnosis not present

## 2022-05-25 ENCOUNTER — Other Ambulatory Visit: Payer: Self-pay

## 2022-05-25 ENCOUNTER — Emergency Department (HOSPITAL_BASED_OUTPATIENT_CLINIC_OR_DEPARTMENT_OTHER): Payer: Federal, State, Local not specified - PPO

## 2022-05-25 ENCOUNTER — Inpatient Hospital Stay (HOSPITAL_BASED_OUTPATIENT_CLINIC_OR_DEPARTMENT_OTHER)
Admission: EM | Admit: 2022-05-25 | Discharge: 2022-05-27 | DRG: 355 | Disposition: A | Discharge status: Home / Self Care | Payer: Federal, State, Local not specified - PPO | Source: Ambulatory Visit | Attending: Family Medicine | Admitting: Family Medicine

## 2022-05-25 ENCOUNTER — Encounter (HOSPITAL_BASED_OUTPATIENT_CLINIC_OR_DEPARTMENT_OTHER): Payer: Self-pay | Admitting: Emergency Medicine

## 2022-05-25 DIAGNOSIS — Z887 Allergy status to serum and vaccine status: Secondary | ICD-10-CM

## 2022-05-25 DIAGNOSIS — Z951 Presence of aortocoronary bypass graft: Secondary | ICD-10-CM | POA: Diagnosis not present

## 2022-05-25 DIAGNOSIS — E1165 Type 2 diabetes mellitus with hyperglycemia: Secondary | ICD-10-CM | POA: Diagnosis present

## 2022-05-25 DIAGNOSIS — Z9981 Dependence on supplemental oxygen: Secondary | ICD-10-CM | POA: Diagnosis not present

## 2022-05-25 DIAGNOSIS — Z833 Family history of diabetes mellitus: Secondary | ICD-10-CM

## 2022-05-25 DIAGNOSIS — E669 Obesity, unspecified: Secondary | ICD-10-CM | POA: Diagnosis present

## 2022-05-25 DIAGNOSIS — Z79890 Hormone replacement therapy (postmenopausal): Secondary | ICD-10-CM

## 2022-05-25 DIAGNOSIS — M1990 Unspecified osteoarthritis, unspecified site: Secondary | ICD-10-CM | POA: Diagnosis not present

## 2022-05-25 DIAGNOSIS — I10 Essential (primary) hypertension: Secondary | ICD-10-CM | POA: Diagnosis not present

## 2022-05-25 DIAGNOSIS — Z87891 Personal history of nicotine dependence: Secondary | ICD-10-CM

## 2022-05-25 DIAGNOSIS — Z91013 Allergy to seafood: Secondary | ICD-10-CM

## 2022-05-25 DIAGNOSIS — I2510 Atherosclerotic heart disease of native coronary artery without angina pectoris: Secondary | ICD-10-CM | POA: Diagnosis not present

## 2022-05-25 DIAGNOSIS — I083 Combined rheumatic disorders of mitral, aortic and tricuspid valves: Secondary | ICD-10-CM | POA: Diagnosis not present

## 2022-05-25 DIAGNOSIS — J449 Chronic obstructive pulmonary disease, unspecified: Secondary | ICD-10-CM | POA: Diagnosis present

## 2022-05-25 DIAGNOSIS — Z8701 Personal history of pneumonia (recurrent): Secondary | ICD-10-CM

## 2022-05-25 DIAGNOSIS — R1033 Periumbilical pain: Secondary | ICD-10-CM

## 2022-05-25 DIAGNOSIS — E039 Hypothyroidism, unspecified: Secondary | ICD-10-CM | POA: Diagnosis present

## 2022-05-25 DIAGNOSIS — Z6837 Body mass index (BMI) 37.0-37.9, adult: Secondary | ICD-10-CM

## 2022-05-25 DIAGNOSIS — K460 Unspecified abdominal hernia with obstruction, without gangrene: Principal | ICD-10-CM

## 2022-05-25 DIAGNOSIS — K420 Umbilical hernia with obstruction, without gangrene: Principal | ICD-10-CM | POA: Diagnosis present

## 2022-05-25 DIAGNOSIS — Z9861 Coronary angioplasty status: Secondary | ICD-10-CM

## 2022-05-25 DIAGNOSIS — E7800 Pure hypercholesterolemia, unspecified: Secondary | ICD-10-CM | POA: Diagnosis not present

## 2022-05-25 DIAGNOSIS — I252 Old myocardial infarction: Secondary | ICD-10-CM

## 2022-05-25 DIAGNOSIS — E785 Hyperlipidemia, unspecified: Secondary | ICD-10-CM | POA: Diagnosis present

## 2022-05-25 DIAGNOSIS — I700 Atherosclerosis of aorta: Secondary | ICD-10-CM | POA: Diagnosis not present

## 2022-05-25 DIAGNOSIS — Z85038 Personal history of other malignant neoplasm of large intestine: Secondary | ICD-10-CM

## 2022-05-25 DIAGNOSIS — G4733 Obstructive sleep apnea (adult) (pediatric): Secondary | ICD-10-CM | POA: Diagnosis present

## 2022-05-25 DIAGNOSIS — Z79899 Other long term (current) drug therapy: Secondary | ICD-10-CM

## 2022-05-25 DIAGNOSIS — I4891 Unspecified atrial fibrillation: Secondary | ICD-10-CM | POA: Diagnosis present

## 2022-05-25 DIAGNOSIS — Z888 Allergy status to other drugs, medicaments and biological substances status: Secondary | ICD-10-CM

## 2022-05-25 DIAGNOSIS — Z8679 Personal history of other diseases of the circulatory system: Secondary | ICD-10-CM

## 2022-05-25 DIAGNOSIS — K4090 Unilateral inguinal hernia, without obstruction or gangrene, not specified as recurrent: Secondary | ICD-10-CM | POA: Diagnosis not present

## 2022-05-25 DIAGNOSIS — K430 Incisional hernia with obstruction, without gangrene: Secondary | ICD-10-CM | POA: Diagnosis not present

## 2022-05-25 DIAGNOSIS — K219 Gastro-esophageal reflux disease without esophagitis: Secondary | ICD-10-CM | POA: Diagnosis not present

## 2022-05-25 DIAGNOSIS — Z8546 Personal history of malignant neoplasm of prostate: Secondary | ICD-10-CM

## 2022-05-25 DIAGNOSIS — K421 Umbilical hernia with gangrene: Principal | ICD-10-CM | POA: Diagnosis present

## 2022-05-25 DIAGNOSIS — Z86718 Personal history of other venous thrombosis and embolism: Secondary | ICD-10-CM

## 2022-05-25 DIAGNOSIS — Z7982 Long term (current) use of aspirin: Secondary | ICD-10-CM

## 2022-05-25 DIAGNOSIS — I272 Pulmonary hypertension, unspecified: Secondary | ICD-10-CM | POA: Diagnosis present

## 2022-05-25 DIAGNOSIS — R109 Unspecified abdominal pain: Secondary | ICD-10-CM | POA: Diagnosis not present

## 2022-05-25 DIAGNOSIS — Z7984 Long term (current) use of oral hypoglycemic drugs: Secondary | ICD-10-CM

## 2022-05-25 LAB — COMPREHENSIVE METABOLIC PANEL
ALT: 15 U/L (ref 0–44)
AST: 17 U/L (ref 15–41)
Albumin: 4.5 g/dL (ref 3.5–5.0)
Alkaline Phosphatase: 72 U/L (ref 38–126)
Anion gap: 11 (ref 5–15)
BUN: 24 mg/dL — ABNORMAL HIGH (ref 8–23)
CO2: 29 mmol/L (ref 22–32)
Calcium: 9.8 mg/dL (ref 8.9–10.3)
Chloride: 99 mmol/L (ref 98–111)
Creatinine, Ser: 1.16 mg/dL (ref 0.61–1.24)
GFR, Estimated: >60 mL/min (ref >60)
Glucose, Bld: 152 mg/dL — ABNORMAL HIGH (ref 70–99)
Potassium: 4.2 mmol/L (ref 3.5–5.1)
Sodium: 139 mmol/L (ref 135–145)
Total Bilirubin: 0.4 mg/dL (ref 0.3–1.2)
Total Protein: 7.8 g/dL (ref 6.5–8.1)

## 2022-05-25 LAB — CBC
HCT: 43.6 % (ref 39.0–52.0)
Hemoglobin: 13.8 g/dL (ref 13.0–17.0)
MCH: 31 pg (ref 26.0–34.0)
MCHC: 31.7 g/dL (ref 30.0–36.0)
MCV: 98 fL (ref 80.0–100.0)
Platelets: 178 10*3/uL (ref 150–400)
RBC: 4.45 MIL/uL (ref 4.22–5.81)
RDW: 15.7 % — ABNORMAL HIGH (ref 11.5–15.5)
WBC: 6.9 10*3/uL (ref 4.0–10.5)
nRBC: 0 % (ref 0.0–0.2)

## 2022-05-25 LAB — LIPASE, BLOOD: Lipase: 37 U/L (ref 11–51)

## 2022-05-25 MED ORDER — IOHEXOL 300 MG/ML  SOLN
100.0000 mL | Freq: Once | INTRAMUSCULAR | Status: AC | PRN
Start: 2022-05-25 — End: 2022-05-25
  Administered 2022-05-25: 100 mL via INTRAVENOUS

## 2022-05-25 NOTE — ED Provider Notes (Signed)
[facility] EMERGENCY DEPT Provider Note   CSN: [id number] Arrival date & time: 05/25/22  1922     History  Chief Complaint  Patient presents with   Abdominal Pain    Jeremiah [name] is a 76 y.o. male.  Patient presents with abdominal swelling and pain.  Symptoms ongoing for 1 day.  He went to urgent care was sent to the ER for further evaluation.  Denies fevers denies vomiting or diarrhea.  Has multiple comorbidities including heart disease coronary artery bypass graft surgery, diabetes among others.       Home Medications Prior to Admission medications   Medication Sig Start Date End Date Taking? Authorizing Provider  albuterol (PROVENTIL) (2.5 MG/3ML) 0.083% nebulizer solution Take 3 mLs (2.5 mg total) by nebulization every 4 (four) hours as needed for wheezing or shortness of breath. 01/01/19   Nyoka Cowden, MD  arformoterol (BROVANA) 15 MCG/2ML NEBU Take 2 mLs (15 mcg total) by nebulization 2 (two) times daily. 11/07/21   Nyoka Cowden, MD  aspirin 81 MG EC tablet Take 81 mg by mouth daily. 06/23/21   [provider]  atorvastatin (LIPITOR) 80 MG tablet Take 80 mg by mouth daily.    [provider]  bisoprolol (ZEBETA) 5 MG tablet TAKE 1 TABLET(5 MG) BY MOUTH DAILY 04/20/22   Hilty, Lisette Abu, MD  colchicine 0.6 MG tablet Take 0.6 mg by mouth as needed.    [provider]  empagliflozin (JARDIANCE) 25 MG TABS tablet TAKE 1 TABLET(25 MG) BY MOUTH DAILY BEFORE BREAKFAST 05/03/22   Hilty, Lisette Abu, MD  ezetimibe (ZETIA) 10 MG tablet TAKE 1 TABLET(10 MG) BY MOUTH DAILY 09/13/21   Hilty, Lisette Abu, MD  ferrous sulfate (FEROSUL) 325 (65 FE) MG tablet TAKE 1 TABLET BY MOUTH DAILY WITH BREAKFAST 05/03/22   Hilty, Lisette Abu, MD  furosemide (LASIX) 40 MG tablet Take 1 tablet (40 mg total) by mouth daily. 07/18/21   Hilty, Lisette Abu, MD  glimepiride (AMARYL) 4 MG tablet Take 4 mg by mouth daily with breakfast.    [provider]  levothyroxine  (SYNTHROID, LEVOTHROID) 88 MCG tablet Take 88 mcg by mouth daily before breakfast.    [provider]  losartan (COZAAR) 25 MG tablet Take 25 mg by mouth daily.    [provider]  metFORMIN (GLUCOPHAGE-XR) 500 MG 24 hr tablet Take 2,000 mg by mouth daily.    [provider]  OXYGEN Inhale 3 L into the lungs at bedtime. 4 lpm with 24/7 [facility]    [provider]  pantoprazole (PROTONIX) 40 MG tablet Take 40 mg by mouth daily.    [provider]  revefenacin (YUPELRI) 175 MCG/3ML nebulizer solution Inhale 3 mLs (175 mcg total) into the lungs every morning. 07/20/21   Nyoka Cowden, MD  revefenacin (YUPELRI) 175 MCG/3ML nebulizer solution Take 3 mLs (175 mcg total) by nebulization daily. 07/20/21   Nyoka Cowden, MD      Allergies    Shellfish allergy, Empagliflozin, and Fluzone [influenza virus vaccine]    Review of Systems   Review of Systems  Constitutional:  Negative for fever.  HENT:  Negative for ear pain and sore throat.   Eyes:  Negative for pain.  Respiratory:  Negative for cough.   Cardiovascular:  Negative for chest pain.  Gastrointestinal:  Positive for abdominal pain.  Genitourinary:  Negative for flank pain.  Musculoskeletal:  Negative for back pain.  Skin:  Negative for color change and  rash.  Neurological:  Negative for syncope.  All other systems reviewed and are negative.   Physical Exam Updated Vital Signs BP 110/84 (BP Location: Right Arm)   Pulse 72   Temp 98.7 F (37.1 C)   Resp 16   SpO2 95%  Physical Exam Constitutional:      Appearance: He is well-developed.  HENT:     Head: Normocephalic.     Nose: Nose normal.  Eyes:     Extraocular Movements: Extraocular movements intact.  Cardiovascular:     Rate and Rhythm: Normal rate.  Pulmonary:     Effort: Pulmonary effort is normal.  Abdominal:     Comments: Periumbilical swelling and tenderness noted.  Palpable hernia felt unable to reduce.  Skin:     Coloration: Skin is not jaundiced.  Neurological:     Mental Status: He is alert. Mental status is at baseline.     ED Results / Procedures / Treatments   Labs (all labs ordered are listed, but only abnormal results are displayed) Labs Reviewed  COMPREHENSIVE METABOLIC PANEL - Abnormal; Notable for the following components:      Result Value   Glucose, Bld 152 (*)    BUN 24 (*)    All other components within normal limits  CBC - Abnormal; Notable for the following components:   RDW 15.7 (*)    All other components within normal limits  LIPASE, BLOOD  URINALYSIS, ROUTINE W REFLEX MICROSCOPIC    EKG None  Radiology US Abdomen Limited  Result Date: 05/25/2022 CLINICAL DATA:  Possible umbilical hernia. EXAM: ULTRASOUND ABDOMEN LIMITED COMPARISON:  CT abdomen and pelvis 05/25/2022. FINDINGS: There is a fat containing hernia the level of the umbilicus. Herniated fat measures 1.1 x 1.5 by 3.0 cm and appears similar to prior CT. IMPRESSION: Fat containing umbilical hernia appears unchanged from prior CT. Electronically Signed   By: Darliss Cheney M.D.   On: 05/25/2022 22:08   CT Abdomen Pelvis W Contrast  Result Date: 05/25/2022 CLINICAL DATA:  Abdominal pain, known umbilical hernia EXAM: CT ABDOMEN AND PELVIS WITH CONTRAST TECHNIQUE: Multidetector CT imaging of the abdomen and pelvis was performed using the standard protocol following bolus administration of intravenous contrast. RADIATION DOSE REDUCTION: This exam was performed according to the departmental dose-optimization program which includes automated exposure control, adjustment of the mA and/or kV according to patient size and/or use of iterative reconstruction technique. CONTRAST:  OMNIPAQUE IOHEXOL 300 MG/ML  SOLN COMPARISON:  None Available. FINDINGS: Lower chest: Lung bases are free of acute infiltrate or sizable effusion. Hepatobiliary: Fatty infiltration of the liver is noted. The gallbladder is within normal limits.  Pancreas: Unremarkable. No pancreatic ductal dilatation or surrounding inflammatory changes. Spleen: Normal in size without focal abnormality. Adrenals/Urinary Tract: Adrenal glands are within normal limits. Kidneys demonstrate a normal enhancement pattern bilaterally. No renal calculi or obstructive changes are noted. Ureters are within normal limits. The bladder is well distended. Stomach/Bowel: Colon demonstrates scattered diverticular change without evidence of diverticulitis. No obstructive changes are seen. The appendix is within normal limits. Small bowel and stomach are unremarkable. Vascular/Lymphatic: Aortic atherosclerosis. No enlarged abdominal or pelvic lymph nodes. Reproductive: Prostate is unremarkable. Other: Small fat containing umbilical hernia is noted. Some very mild inflammatory changes are seen surrounding the hernia although no bowel is noted within. No free fluid is noted. Musculoskeletal: Degenerative changes of lumbar spine are noted. IMPRESSION: Fat containing umbilical hernia with mild surrounding inflammatory changes. These changes likely represent some incarceration of  the herniated fat. No bowel incarceration is noted. Diverticulosis without diverticulitis. Fatty liver. Electronically Signed   By: Alcide Clever M.D.   On: 05/25/2022 21:54    Procedures Procedures    Medications Ordered in ED Medications  iohexol (OMNIPAQUE) 300 MG/ML solution 100 mL (100 mLs Intravenous Contrast Given 05/25/22 2149)    ED Course/ Medical Decision Making/ A&P                           Medical Decision Making Amount and/or Complexity of Data Reviewed Labs: ordered. Radiology: ordered.  Risk Prescription drug management.   Review of records shows visit for insect bites May 09, 2022.  Cardiac monitor shows sinus rhythm.  History from family at bedside.  Diagnostic studies were sent.  White count normal chemistry normal lipase normal.  Abdomen pelvis CT scan shows incarcerated  umbilical hernia containing only fat.  Case discussed with on-call surgeon Dr. Sheliah Hatch, recommends medical admission to [facility] and surgical evaluation.        Final Clinical Impression(s) / ED Diagnoses Final diagnoses:  Incarcerated hernia    Rx / DC Orders ED Discharge Orders     None         Cheryll Cockayne, MD 05/25/22 2256

## 2022-05-25 NOTE — ED Notes (Signed)
Pt has concerns for possible hernia at his belly button.

## 2022-05-26 ENCOUNTER — Encounter (HOSPITAL_COMMUNITY): Payer: Self-pay | Admitting: Internal Medicine

## 2022-05-26 ENCOUNTER — Encounter (HOSPITAL_COMMUNITY)
Admission: EM | Disposition: A | Discharge status: Home / Self Care | Payer: Self-pay | Source: Ambulatory Visit | Attending: Internal Medicine

## 2022-05-26 ENCOUNTER — Observation Stay (HOSPITAL_COMMUNITY): Payer: Federal, State, Local not specified - PPO | Admitting: Anesthesiology

## 2022-05-26 DIAGNOSIS — K420 Umbilical hernia with obstruction, without gangrene: Secondary | ICD-10-CM | POA: Diagnosis present

## 2022-05-26 DIAGNOSIS — M1990 Unspecified osteoarthritis, unspecified site: Secondary | ICD-10-CM | POA: Diagnosis not present

## 2022-05-26 DIAGNOSIS — E039 Hypothyroidism, unspecified: Secondary | ICD-10-CM | POA: Diagnosis not present

## 2022-05-26 DIAGNOSIS — E1165 Type 2 diabetes mellitus with hyperglycemia: Secondary | ICD-10-CM | POA: Diagnosis not present

## 2022-05-26 DIAGNOSIS — I10 Essential (primary) hypertension: Secondary | ICD-10-CM | POA: Diagnosis not present

## 2022-05-26 DIAGNOSIS — J449 Chronic obstructive pulmonary disease, unspecified: Secondary | ICD-10-CM | POA: Diagnosis not present

## 2022-05-26 DIAGNOSIS — Z6837 Body mass index (BMI) 37.0-37.9, adult: Secondary | ICD-10-CM | POA: Diagnosis not present

## 2022-05-26 DIAGNOSIS — Z9981 Dependence on supplemental oxygen: Secondary | ICD-10-CM | POA: Diagnosis not present

## 2022-05-26 DIAGNOSIS — G4733 Obstructive sleep apnea (adult) (pediatric): Secondary | ICD-10-CM | POA: Diagnosis not present

## 2022-05-26 DIAGNOSIS — Z85038 Personal history of other malignant neoplasm of large intestine: Secondary | ICD-10-CM | POA: Diagnosis not present

## 2022-05-26 DIAGNOSIS — Z8679 Personal history of other diseases of the circulatory system: Secondary | ICD-10-CM | POA: Diagnosis not present

## 2022-05-26 DIAGNOSIS — K460 Unspecified abdominal hernia with obstruction, without gangrene: Secondary | ICD-10-CM | POA: Diagnosis not present

## 2022-05-26 DIAGNOSIS — I252 Old myocardial infarction: Secondary | ICD-10-CM | POA: Diagnosis not present

## 2022-05-26 DIAGNOSIS — Z951 Presence of aortocoronary bypass graft: Secondary | ICD-10-CM

## 2022-05-26 DIAGNOSIS — K219 Gastro-esophageal reflux disease without esophagitis: Secondary | ICD-10-CM | POA: Diagnosis not present

## 2022-05-26 DIAGNOSIS — I2510 Atherosclerotic heart disease of native coronary artery without angina pectoris: Secondary | ICD-10-CM | POA: Diagnosis not present

## 2022-05-26 DIAGNOSIS — Z7982 Long term (current) use of aspirin: Secondary | ICD-10-CM | POA: Diagnosis not present

## 2022-05-26 DIAGNOSIS — I4891 Unspecified atrial fibrillation: Secondary | ICD-10-CM | POA: Diagnosis not present

## 2022-05-26 DIAGNOSIS — I272 Pulmonary hypertension, unspecified: Secondary | ICD-10-CM | POA: Diagnosis not present

## 2022-05-26 DIAGNOSIS — Z79890 Hormone replacement therapy (postmenopausal): Secondary | ICD-10-CM | POA: Diagnosis not present

## 2022-05-26 DIAGNOSIS — Z7984 Long term (current) use of oral hypoglycemic drugs: Secondary | ICD-10-CM | POA: Diagnosis not present

## 2022-05-26 DIAGNOSIS — I110 Hypertensive heart disease with heart failure: Secondary | ICD-10-CM | POA: Diagnosis not present

## 2022-05-26 DIAGNOSIS — Z79899 Other long term (current) drug therapy: Secondary | ICD-10-CM | POA: Diagnosis not present

## 2022-05-26 DIAGNOSIS — E7800 Pure hypercholesterolemia, unspecified: Secondary | ICD-10-CM | POA: Diagnosis not present

## 2022-05-26 DIAGNOSIS — R1033 Periumbilical pain: Secondary | ICD-10-CM | POA: Diagnosis not present

## 2022-05-26 DIAGNOSIS — E785 Hyperlipidemia, unspecified: Secondary | ICD-10-CM

## 2022-05-26 DIAGNOSIS — E669 Obesity, unspecified: Secondary | ICD-10-CM | POA: Diagnosis present

## 2022-05-26 DIAGNOSIS — I083 Combined rheumatic disorders of mitral, aortic and tricuspid valves: Secondary | ICD-10-CM | POA: Diagnosis not present

## 2022-05-26 DIAGNOSIS — Z9861 Coronary angioplasty status: Secondary | ICD-10-CM | POA: Diagnosis not present

## 2022-05-26 HISTORY — PX: UMBILICAL HERNIA REPAIR: SHX196

## 2022-05-26 LAB — GLUCOSE, CAPILLARY
Glucose-Capillary: 159 mg/dL — ABNORMAL HIGH (ref 70–99)
Glucose-Capillary: 160 mg/dL — ABNORMAL HIGH (ref 70–99)
Glucose-Capillary: 148 mg/dL — ABNORMAL HIGH (ref 70–99)
Glucose-Capillary: 184 mg/dL — ABNORMAL HIGH (ref 70–99)

## 2022-05-26 LAB — HEMOGLOBIN A1C
Hgb A1c MFr Bld: 7.7 % — ABNORMAL HIGH (ref 4.8–5.6)
Mean Plasma Glucose: 174.29 mg/dL

## 2022-05-26 SURGERY — REPAIR, HERNIA, UMBILICAL, ADULT
Anesthesia: General | Site: Abdomen

## 2022-05-26 MED ORDER — ALBUTEROL SULFATE (2.5 MG/3ML) 0.083% IN NEBU: 2.5000 mg | INHALATION_SOLUTION | Freq: Four times a day (QID) | RESPIRATORY_TRACT | Status: DC | PRN

## 2022-05-26 MED ORDER — SUGAMMADEX SODIUM 200 MG/2ML IV SOLN
INTRAVENOUS | Status: DC | PRN
  Administered 2022-05-26: 300 mg via INTRAVENOUS

## 2022-05-26 MED ORDER — LIDOCAINE HCL 1 % IJ SOLN
INTRAMUSCULAR | Status: AC
  Filled 2022-05-26: qty 20

## 2022-05-26 MED ORDER — LEVOTHYROXINE SODIUM 88 MCG PO TABS
88.0000 ug | ORAL_TABLET | Freq: Every day | ORAL | Status: DC
  Administered 2022-05-27: 88 ug via ORAL
  Filled 2022-05-26: qty 1

## 2022-05-26 MED ORDER — ATORVASTATIN CALCIUM 80 MG PO TABS
80.0000 mg | ORAL_TABLET | Freq: Every day | ORAL | Status: DC
  Administered 2022-05-26 – 2022-05-27 (×2): 80 mg via ORAL
  Filled 2022-05-26 (×2): qty 1

## 2022-05-26 MED ORDER — EMPAGLIFLOZIN 25 MG PO TABS
25.0000 mg | ORAL_TABLET | Freq: Every day | ORAL | Status: DC
Start: 2022-05-26 — End: 2022-05-27
  Administered 2022-05-26 – 2022-05-27 (×2): 25 mg via ORAL
  Filled 2022-05-26 (×2): qty 1

## 2022-05-26 MED ORDER — ASPIRIN 81 MG PO TBEC
81.0000 mg | DELAYED_RELEASE_TABLET | Freq: Every day | ORAL | Status: DC
  Administered 2022-05-27: 81 mg via ORAL
  Filled 2022-05-26: qty 1

## 2022-05-26 MED ORDER — SODIUM CHLORIDE 0.9% FLUSH
3.0000 mL | Freq: Two times a day (BID) | INTRAVENOUS | Status: DC
  Administered 2022-05-26 – 2022-05-27 (×3): 3 mL via INTRAVENOUS

## 2022-05-26 MED ORDER — CEFAZOLIN SODIUM-DEXTROSE 2-4 GM/100ML-% IV SOLN
INTRAVENOUS | Status: DC
Start: 2022-05-26 — End: 2022-05-26
  Filled 2022-05-26: qty 100

## 2022-05-26 MED ORDER — FENTANYL CITRATE (PF) 100 MCG/2ML IJ SOLN: 25.0000 ug | INTRAMUSCULAR | Status: DC | PRN

## 2022-05-26 MED ORDER — ONDANSETRON HCL 4 MG/2ML IJ SOLN
INTRAMUSCULAR | Status: DC | PRN
  Administered 2022-05-26: 4 mg via INTRAVENOUS

## 2022-05-26 MED ORDER — LACTATED RINGERS IV SOLN: INTRAVENOUS | Status: DC

## 2022-05-26 MED ORDER — ONDANSETRON HCL 4 MG/2ML IJ SOLN
INTRAMUSCULAR | Status: AC
  Filled 2022-05-26: qty 2

## 2022-05-26 MED ORDER — ROCURONIUM BROMIDE 10 MG/ML (PF) SYRINGE
PREFILLED_SYRINGE | INTRAVENOUS | Status: AC
  Filled 2022-05-26: qty 10

## 2022-05-26 MED ORDER — LIDOCAINE 2% (20 MG/ML) 5 ML SYRINGE
INTRAMUSCULAR | Status: AC
Start: 2022-05-26 — End: ?
  Filled 2022-05-26: qty 5

## 2022-05-26 MED ORDER — ACETAMINOPHEN 650 MG RE SUPP: 650.0000 mg | Freq: Four times a day (QID) | RECTAL | Status: DC | PRN

## 2022-05-26 MED ORDER — INSULIN ASPART 100 UNIT/ML IJ SOLN
0.0000 [IU] | Freq: Three times a day (TID) | INTRAMUSCULAR | Status: DC
  Administered 2022-05-26: 2 [IU] via SUBCUTANEOUS
  Administered 2022-05-27: 3 [IU] via SUBCUTANEOUS

## 2022-05-26 MED ORDER — DEXAMETHASONE SODIUM PHOSPHATE 10 MG/ML IJ SOLN
INTRAMUSCULAR | Status: AC
  Filled 2022-05-26: qty 1

## 2022-05-26 MED ORDER — ENOXAPARIN SODIUM 40 MG/0.4ML IJ SOSY
40.0000 mg | PREFILLED_SYRINGE | INTRAMUSCULAR | Status: DC
  Administered 2022-05-26 – 2022-05-27 (×2): 40 mg via SUBCUTANEOUS
  Filled 2022-05-26 (×2): qty 0.4

## 2022-05-26 MED ORDER — CHLORHEXIDINE GLUCONATE 0.12 % MT SOLN
OROMUCOSAL | Status: AC
  Administered 2022-05-26: 15 mL via OROMUCOSAL
  Filled 2022-05-26: qty 15

## 2022-05-26 MED ORDER — LIDOCAINE 2% (20 MG/ML) 5 ML SYRINGE
INTRAMUSCULAR | Status: DC | PRN
  Administered 2022-05-26: 100 mg via INTRAVENOUS

## 2022-05-26 MED ORDER — HYDROCODONE-ACETAMINOPHEN 5-325 MG PO TABS
1.0000 | ORAL_TABLET | Freq: Four times a day (QID) | ORAL | Status: DC | PRN
  Administered 2022-05-26 – 2022-05-27 (×2): 2 via ORAL
  Filled 2022-05-26 (×2): qty 2

## 2022-05-26 MED ORDER — LIDOCAINE HCL 1 % IJ SOLN
INTRAMUSCULAR | Status: DC | PRN
  Administered 2022-05-26: 30 mL via SUBCUTANEOUS

## 2022-05-26 MED ORDER — PANTOPRAZOLE SODIUM 40 MG PO TBEC
40.0000 mg | DELAYED_RELEASE_TABLET | Freq: Two times a day (BID) | ORAL | Status: DC
  Administered 2022-05-26 – 2022-05-27 (×3): 40 mg via ORAL
  Filled 2022-05-26 (×3): qty 1

## 2022-05-26 MED ORDER — CEFAZOLIN SODIUM-DEXTROSE 2-3 GM-%(50ML) IV SOLR
INTRAVENOUS | Status: DC | PRN
  Administered 2022-05-26: 2 g via INTRAVENOUS

## 2022-05-26 MED ORDER — REVEFENACIN 175 MCG/3ML IN SOLN
175.0000 ug | RESPIRATORY_TRACT | Status: DC
Start: 2022-05-27 — End: 2022-05-27
  Filled 2022-05-26: qty 3

## 2022-05-26 MED ORDER — CHLORHEXIDINE GLUCONATE 0.12 % MT SOLN: 15.0000 mL | Freq: Once | OROMUCOSAL | Status: AC

## 2022-05-26 MED ORDER — ACETAMINOPHEN 325 MG PO TABS
650.0000 mg | ORAL_TABLET | Freq: Four times a day (QID) | ORAL | Status: DC | PRN
  Administered 2022-05-26: 650 mg via ORAL
  Filled 2022-05-26: qty 2

## 2022-05-26 MED ORDER — PHENYLEPHRINE HCL-NACL 20-0.9 MG/250ML-% IV SOLN
INTRAVENOUS | Status: DC | PRN
  Administered 2022-05-26: 40 ug/min via INTRAVENOUS

## 2022-05-26 MED ORDER — ROCURONIUM BROMIDE 10 MG/ML (PF) SYRINGE
PREFILLED_SYRINGE | INTRAVENOUS | Status: DC | PRN
  Administered 2022-05-26: 60 mg via INTRAVENOUS

## 2022-05-26 MED ORDER — INSULIN ASPART 100 UNIT/ML IJ SOLN: 0.0000 [IU] | INTRAMUSCULAR | Status: DC | PRN

## 2022-05-26 MED ORDER — EPHEDRINE SULFATE-NACL 50-0.9 MG/10ML-% IV SOSY
PREFILLED_SYRINGE | INTRAVENOUS | Status: DC | PRN
  Administered 2022-05-26: 5 mg via INTRAVENOUS

## 2022-05-26 MED ORDER — ARFORMOTEROL TARTRATE 15 MCG/2ML IN NEBU
15.0000 ug | INHALATION_SOLUTION | Freq: Two times a day (BID) | RESPIRATORY_TRACT | Status: DC
Start: 2022-05-26 — End: 2022-05-27
  Administered 2022-05-26 – 2022-05-27 (×2): 15 ug via RESPIRATORY_TRACT
  Filled 2022-05-26 (×2): qty 2

## 2022-05-26 MED ORDER — PANTOPRAZOLE SODIUM 40 MG IV SOLR
40.0000 mg | Freq: Once | INTRAVENOUS | Status: AC
  Administered 2022-05-26: 40 mg via INTRAVENOUS
  Filled 2022-05-26: qty 10

## 2022-05-26 MED ORDER — LOSARTAN POTASSIUM 25 MG PO TABS
25.0000 mg | ORAL_TABLET | Freq: Every day | ORAL | Status: DC
  Administered 2022-05-26 – 2022-05-27 (×2): 25 mg via ORAL
  Filled 2022-05-26 (×2): qty 1

## 2022-05-26 MED ORDER — SODIUM CHLORIDE 0.9 % IV SOLN: INTRAVENOUS | Status: AC

## 2022-05-26 MED ORDER — BISOPROLOL FUMARATE 5 MG PO TABS
5.0000 mg | ORAL_TABLET | Freq: Every day | ORAL | Status: DC
  Administered 2022-05-26 – 2022-05-27 (×2): 5 mg via ORAL
  Filled 2022-05-26 (×2): qty 1

## 2022-05-26 MED ORDER — ONDANSETRON HCL 4 MG/2ML IJ SOLN
4.0000 mg | Freq: Four times a day (QID) | INTRAMUSCULAR | Status: AC | PRN
  Administered 2022-05-26 – 2022-05-27 (×2): 4 mg via INTRAVENOUS
  Filled 2022-05-26 (×2): qty 2

## 2022-05-26 MED ORDER — FENTANYL CITRATE (PF) 250 MCG/5ML IJ SOLN
INTRAMUSCULAR | Status: AC
  Filled 2022-05-26: qty 5

## 2022-05-26 MED ORDER — EZETIMIBE 10 MG PO TABS
10.0000 mg | ORAL_TABLET | Freq: Every day | ORAL | Status: DC
Start: 2022-05-26 — End: 2022-05-27
  Administered 2022-05-26 – 2022-05-27 (×2): 10 mg via ORAL
  Filled 2022-05-26 (×2): qty 1

## 2022-05-26 MED ORDER — FENTANYL CITRATE (PF) 250 MCG/5ML IJ SOLN
INTRAMUSCULAR | Status: DC | PRN
  Administered 2022-05-26 (×2): 100 ug via INTRAVENOUS

## 2022-05-26 MED ORDER — MORPHINE SULFATE (PF) 2 MG/ML IV SOLN: 2.0000 mg | INTRAVENOUS | Status: DC | PRN

## 2022-05-26 MED ORDER — MORPHINE SULFATE (PF) 2 MG/ML IV SOLN
1.0000 mg | INTRAVENOUS | Status: AC | PRN
  Administered 2022-05-26 (×2): 2 mg via INTRAVENOUS
  Administered 2022-05-27: 1 mg via INTRAVENOUS
  Filled 2022-05-26 (×3): qty 1

## 2022-05-26 MED ORDER — FUROSEMIDE 40 MG PO TABS
40.0000 mg | ORAL_TABLET | Freq: Every day | ORAL | Status: DC
  Administered 2022-05-27: 40 mg via ORAL
  Filled 2022-05-26: qty 1

## 2022-05-26 MED ORDER — PROPOFOL 10 MG/ML IV BOLUS
INTRAVENOUS | Status: DC | PRN
  Administered 2022-05-26: 140 mg via INTRAVENOUS

## 2022-05-26 MED ORDER — 0.9 % SODIUM CHLORIDE (POUR BTL) OPTIME
TOPICAL | Status: DC | PRN
  Administered 2022-05-26: 1000 mL

## 2022-05-26 MED ORDER — HYDROCODONE-ACETAMINOPHEN 5-325 MG PO TABS: 1.0000 | ORAL_TABLET | Freq: Four times a day (QID) | ORAL | Status: DC | PRN

## 2022-05-26 MED ORDER — ORAL CARE MOUTH RINSE: 15.0000 mL | Freq: Once | OROMUCOSAL | Status: AC

## 2022-05-26 MED ORDER — HYDRALAZINE HCL 20 MG/ML IJ SOLN: 10.0000 mg | INTRAMUSCULAR | Status: DC | PRN

## 2022-05-26 MED ORDER — BUPIVACAINE-EPINEPHRINE (PF) 0.25% -1:200000 IJ SOLN
INTRAMUSCULAR | Status: AC
Start: 2022-05-26 — End: ?
  Filled 2022-05-26: qty 30

## 2022-05-26 MED ORDER — CEFAZOLIN SODIUM-DEXTROSE 2-4 GM/100ML-% IV SOLN
INTRAVENOUS | Status: AC
  Filled 2022-05-26: qty 100

## 2022-05-26 SURGICAL SUPPLY — 41 items
ADH SKN CLS APL DERMABOND .7 (GAUZE/BANDAGES/DRESSINGS) ×1
APL PRP STRL LF DISP 70% ISPRP (MISCELLANEOUS) ×1
APL SKNCLS STERI-STRIP NONHPOA (GAUZE/BANDAGES/DRESSINGS) ×1
BAG COUNTER SPONGE SURGICOUNT (BAG) ×2 IMPLANT
BAG SPNG CNTER NS LX DISP (BAG) ×1
BENZOIN TINCTURE PRP APPL 2/3 (GAUZE/BANDAGES/DRESSINGS) ×1 IMPLANT
CANISTER SUCT 3000ML PPV (MISCELLANEOUS) IMPLANT
CHLORAPREP W/TINT 26 (MISCELLANEOUS) ×2 IMPLANT
COVER SURGICAL LIGHT HANDLE (MISCELLANEOUS) ×2 IMPLANT
DERMABOND ADVANCED (GAUZE/BANDAGES/DRESSINGS) ×1
DERMABOND ADVANCED .7 DNX12 (GAUZE/BANDAGES/DRESSINGS) ×1 IMPLANT
DRAPE LAPAROTOMY 100X72 PEDS (DRAPES) ×2 IMPLANT
DRSG TEGADERM 4X10 (GAUZE/BANDAGES/DRESSINGS) ×1 IMPLANT
ELECT REM PT RETURN 9FT ADLT (ELECTROSURGICAL) ×2
ELECTRODE REM PT RTRN 9FT ADLT (ELECTROSURGICAL) ×1 IMPLANT
GLOVE BIO SURGEON STRL SZ 6 (GLOVE) ×3 IMPLANT
GLOVE INDICATOR 6.5 STRL GRN (GLOVE) ×3 IMPLANT
GOWN STRL REUS W/ TWL LRG LVL3 (GOWN DISPOSABLE) ×1 IMPLANT
GOWN STRL REUS W/TWL 2XL LVL3 (GOWN DISPOSABLE) ×2 IMPLANT
GOWN STRL REUS W/TWL LRG LVL3 (GOWN DISPOSABLE) ×2
KIT BASIN OR (CUSTOM PROCEDURE TRAY) ×2 IMPLANT
KIT TURNOVER KIT B (KITS) ×2 IMPLANT
NDL HYPO 25GX1X1/2 BEV (NEEDLE) ×1 IMPLANT
NEEDLE HYPO 25GX1X1/2 BEV (NEEDLE) ×2 IMPLANT
NS IRRIG 1000ML POUR BTL (IV SOLUTION) ×2 IMPLANT
PACK GENERAL/GYN (CUSTOM PROCEDURE TRAY) ×2 IMPLANT
PAD ARMBOARD 7.5X6 YLW CONV (MISCELLANEOUS) ×2 IMPLANT
PENCIL SMOKE EVACUATOR (MISCELLANEOUS) ×2 IMPLANT
STRIP CLOSURE SKIN 1/2X4 (GAUZE/BANDAGES/DRESSINGS) ×1 IMPLANT
SUT MNCRL AB 4-0 PS2 18 (SUTURE) ×2 IMPLANT
SUT NOVA 0 T19/GS 22DT (SUTURE) ×1 IMPLANT
SUT PROLENE 2 0 CT2 30 (SUTURE) ×2 IMPLANT
SUT SILK 2 0 SH (SUTURE) IMPLANT
SUT VIC AB 2-0 SH 27 (SUTURE) ×4
SUT VIC AB 2-0 SH 27X BRD (SUTURE) ×1 IMPLANT
SUT VIC AB 3-0 SH 27 (SUTURE) ×4
SUT VIC AB 3-0 SH 27XBRD (SUTURE) ×1 IMPLANT
SUT VICRYL AB 2 0 TIES (SUTURE) ×2 IMPLANT
SYR CONTROL 10ML LL (SYRINGE) ×2 IMPLANT
TOWEL GREEN STERILE (TOWEL DISPOSABLE) ×2 IMPLANT
TOWEL GREEN STERILE FF (TOWEL DISPOSABLE) ×2 IMPLANT

## 2022-05-26 NOTE — ED Notes (Signed)
Called [facility] to transport patient to [facility] rm# [id number]

## 2022-05-26 NOTE — Anesthesia Postprocedure Evaluation (Signed)
Anesthesia Post Note  Patient: Jeremiah [name]  Procedure(s) Performed: PRIMARY REPAIR OF STRANGULATED UMBILICAL HERNIA WITH PARTIAL OMENTECTOMY (Abdomen)     Patient location during evaluation: PACU Anesthesia Type: General Level of consciousness: awake and alert Pain management: pain level controlled Vital Signs Assessment: post-procedure vital signs reviewed and stable Respiratory status: spontaneous breathing, nonlabored ventilation, respiratory function stable and patient connected to nasal cannula oxygen Cardiovascular status: blood pressure returned to baseline and stable Postop Assessment: no apparent nausea or vomiting Anesthetic complications: no   No notable events documented.  Last Vitals:  Vitals:   05/26/22 1446 05/26/22 1501  BP: (!) 143/65 (!) 153/75  Pulse: 83 78  Resp: 13 17  Temp:  (!) 36.1 C  SpO2: 94%     Last Pain:  Vitals:   05/26/22 1501  TempSrc:   PainSc: 0-No pain                 Collene Schlichter

## 2022-05-27 ENCOUNTER — Encounter (HOSPITAL_COMMUNITY): Payer: Self-pay | Admitting: General Surgery

## 2022-05-27 ENCOUNTER — Other Ambulatory Visit: Payer: Self-pay

## 2022-05-27 DIAGNOSIS — Z8679 Personal history of other diseases of the circulatory system: Secondary | ICD-10-CM | POA: Diagnosis not present

## 2022-05-27 DIAGNOSIS — I10 Essential (primary) hypertension: Secondary | ICD-10-CM | POA: Diagnosis not present

## 2022-05-27 DIAGNOSIS — K460 Unspecified abdominal hernia with obstruction, without gangrene: Secondary | ICD-10-CM

## 2022-05-27 DIAGNOSIS — K420 Umbilical hernia with obstruction, without gangrene: Secondary | ICD-10-CM | POA: Diagnosis not present

## 2022-05-27 DIAGNOSIS — R1033 Periumbilical pain: Secondary | ICD-10-CM

## 2022-05-27 LAB — CBC
HCT: 45.2 % (ref 39.0–52.0)
Hemoglobin: 14 g/dL (ref 13.0–17.0)
MCH: 31.4 pg (ref 26.0–34.0)
MCHC: 31 g/dL (ref 30.0–36.0)
MCV: 101.3 fL — ABNORMAL HIGH (ref 80.0–100.0)
Platelets: 178 10*3/uL (ref 150–400)
RBC: 4.46 MIL/uL (ref 4.22–5.81)
RDW: 15.9 % — ABNORMAL HIGH (ref 11.5–15.5)
WBC: 7.7 10*3/uL (ref 4.0–10.5)
nRBC: 0.3 % — ABNORMAL HIGH (ref 0.0–0.2)

## 2022-05-27 LAB — BASIC METABOLIC PANEL
Anion gap: 13 (ref 5–15)
BUN: 18 mg/dL (ref 8–23)
CO2: 26 mmol/L (ref 22–32)
Calcium: 9.1 mg/dL (ref 8.9–10.3)
Chloride: 98 mmol/L (ref 98–111)
Creatinine, Ser: 1.21 mg/dL (ref 0.61–1.24)
GFR, Estimated: >60 mL/min (ref >60)
Glucose, Bld: 177 mg/dL — ABNORMAL HIGH (ref 70–99)
Potassium: 4.2 mmol/L (ref 3.5–5.1)
Sodium: 137 mmol/L (ref 135–145)

## 2022-05-27 LAB — GLUCOSE, CAPILLARY
Glucose-Capillary: 166 mg/dL — ABNORMAL HIGH (ref 70–99)
Glucose-Capillary: 196 mg/dL — ABNORMAL HIGH (ref 70–99)

## 2022-05-27 MED ORDER — REVEFENACIN 175 MCG/3ML IN SOLN
175.0000 ug | Freq: Every day | RESPIRATORY_TRACT | Status: DC
  Filled 2022-05-27: qty 3

## 2022-05-27 MED ORDER — HYDROCODONE-ACETAMINOPHEN 5-325 MG PO TABS: 1.0000 | ORAL_TABLET | Freq: Four times a day (QID) | ORAL | 0 refills | Status: DC | PRN

## 2022-05-27 NOTE — Discharge Summary (Addendum)
Physician Discharge Summary   Patient: Jeremiah [name] MRN: [id number] DOB: 12/29/1945  Admit date:     05/25/2022  Discharge date: 05/27/22  Discharge Physician: Meredeth Ide   PCP: [name], MD   Recommendations at discharge:   Follow-up PCP in 2 weeks Follow general surgery in 4 weeks  Discharge Diagnoses: Principal Problem:   Incarcerated umbilical hernia Active Problems:   Essential hypertension   Type 2 diabetes mellitus with hyperglycemia, without long-term current use of insulin (HCC)   S/P CABG x 3   COPD GOLD II with reversibility   Hypothyroidism, acquired   Hyperlipidemia LDL goal <70   History of supraventricular tachycardia   OSA (obstructive sleep apnea)   Obesity (BMI 30-39.9)  Resolved Problems:   * No resolved hospital problems. *  Hospital Course: 76 y.o. male with medical history significant of hypertension, hyperlipidemia, CAD s/p CABGin 2006, SVT s/p ablation, cardiomegaly, moderate pulmonary hypertension, h/o DVT, COPD, diabetes mellitus type 2, and OSA on oxygen at night who presented with complaints of abdominal pain starting yesterday morning while he was walking up a step.  Pain was located around his bellybutton. CT scan of the abdomen and pelvis noted fat-containing umbilical hernia with small surrounding inflammatory changes thought to represent some incarceration of the herniated fat with no bowel incarceration appreciated.  Subsequent ultrasound of the abdomen revealed similar.   General surgery was consulted  Assessment and Plan:  Symptomatic umbilical hernia with incarcerated omental fat -S/p umbilical hernia repair -Tolerating diet well -General surgery has recommended discharge home -Follow general surgery in 1 month  Essential hypertension -Blood pressure well controlled -Continue current home regimen as tolerated  Uncontrolled diabetes mellitus type 2, with hyperglycemia On admission glucose mildly elevated at 152.  Last  hemoglobin A1c 8.9 in 03/2021.  Home medication regimen includes Jardiance 25 mg daily, glimepiride 4 mg daily, and metformin XR 2000 mg daily -Continue home regimen   CAD s/p CABG Patient with prior CABG in 2006. -Continue aspirin and statin   COPD, without exacerbation Patient breathing appears to be at baseline. -Continue home breathing treatments and albuterol as needed for shortness of breath/wheezing   Hypothyroidism, acquired -Continue levothyroxine   Hyperlipidemia -Continue atorvastatin and Zetia   History of SVT Patient with prior history of ablation.  Follows with cardiology.  Atrial fibrillation is listed in past medical history, but review of records with cardiology states that this was never formally appreciated.   Reported history of DVT Noted in past medical history, but cannot find clear documentation at this time to support this and patient does not recall being on anticoagulation.  Notes that may have been related [place] but is not sure.   GERD -Continue Protonix twice daily   OSA Patient is not on CPAP at night. -Continue 3 L nasal cannula oxygen at night      Consultants: General surgery Procedures performed: Umbilical hernia repair Disposition: Home Diet recommendation:  Discharge Diet Orders (From admission, onward)     Start     Ordered   05/27/22 0000  Diet - low sodium heart healthy        05/27/22 1353           Carb modified diet DISCHARGE MEDICATION: Allergies as of 05/27/2022       Reactions   Shellfish Allergy Other (See Comments), Nausea And Vomiting   Rash; shortness of breath  Fluzone [influenza Virus Vaccine] Nausea And Vomiting, Palpitations, Rash        Medication List     TAKE these medications    albuterol (2.5 MG/3ML) 0.083% nebulizer solution Commonly known as: PROVENTIL Take 3 mLs (2.5 mg total) by nebulization  every 4 (four) hours as needed for wheezing or shortness of breath.   arformoterol 15 MCG/2ML Nebu Commonly known as: BROVANA Take 2 mLs (15 mcg total) by nebulization 2 (two) times daily.   aspirin EC 81 MG tablet Take 81 mg by mouth daily.   atorvastatin 80 MG tablet Commonly known as: LIPITOR Take 80 mg by mouth daily.   betamethasone dipropionate 0.05 % cream Apply 1 Application topically 2 (two) times daily as needed for dry skin.   bisoprolol 5 MG tablet Commonly known as: ZEBETA TAKE 1 TABLET(5 MG) BY MOUTH DAILY What changed: See the new instructions.   colchicine 0.6 MG tablet Take 0.6 mg by mouth daily as needed (gout flare up).   ezetimibe 10 MG tablet Commonly known as: ZETIA TAKE 1 TABLET(10 MG) BY MOUTH DAILY What changed: See the new instructions.   FeroSul 325 (65 FE) MG tablet Generic drug: ferrous sulfate TAKE 1 TABLET BY MOUTH DAILY WITH BREAKFAST What changed: how much to take   furosemide 40 MG tablet Commonly known as: LASIX Take 1 tablet (40 mg total) by mouth daily.   glimepiride 4 MG tablet Commonly known as: AMARYL Take 4 mg by mouth daily with breakfast.   HYDROcodone-acetaminophen 5-325 MG tablet Commonly known as: NORCO/VICODIN Take 1-2 tablets by mouth every 6 (six) hours as needed for moderate pain.   Jardiance 25 MG Tabs tablet Generic drug: empagliflozin TAKE 1 TABLET(25 MG) BY MOUTH DAILY BEFORE BREAKFAST What changed: See the new instructions.   levothyroxine 88 MCG tablet Commonly known as: SYNTHROID Take 88 mcg by mouth daily before breakfast.   losartan 25 MG tablet Commonly known as: COZAAR Take 25 mg by mouth daily.   metFORMIN 500 MG 24 hr tablet Commonly known as: GLUCOPHAGE-XR Take 2,000 mg by mouth daily.   OXYGEN Inhale 3 L into the lungs at bedtime. 4 lpm with 24/7 [facility]   pantoprazole 40 MG tablet Commonly known as: PROTONIX Take 40 mg by mouth 2 (two) times daily.   triamcinolone cream 0.5 % Commonly  known as: KENALOG Apply 1 Application topically 2 (two) times daily.   Yupelri 175 MCG/3ML nebulizer solution Generic drug: revefenacin Inhale 3 mLs (175 mcg total) into the lungs every morning.        Follow-up Information     Koirala, Dibas, MD Follow up in 2 week(s).   Specialty: Family Medicine Contact information: [address] [place] [address] [phone]         Chrystie Nose, MD .   Specialty: Cardiology Contact information: [address] [place] [address] [phone]         Regan Lemming, MD .   Specialty: Cardiology Contact information: [address] [place] [address] [phone]         Fritzi Mandes, MD. Schedule an appointment as soon as possible for a visit in 4 week(s).   Specialty: General Surgery Contact information: [address] [place] [place] [address] [phone]                Discharge Exam: Ceasar Mons Weights   05/26/22 1229  Weight: 108 kg   General-appears in  no acute distress Heart-S1-S2, regular, no murmur auscultated Lungs-clear to auscultation bilaterally, no wheezing or crackles auscultated Abdomen-soft, nontender, no organomegaly Extremities-no edema in the lower extremities Neuro-alert, oriented x3, no focal deficit noted  Condition at discharge: good  The results of significant diagnostics from this hospitalization (including imaging, microbiology, ancillary and laboratory) are listed below for reference.   Imaging Studies: US Abdomen Limited  Result Date: 05/25/2022 CLINICAL DATA:  Possible umbilical hernia. EXAM: ULTRASOUND ABDOMEN LIMITED COMPARISON:  CT abdomen and pelvis 05/25/2022. FINDINGS: There is a fat containing hernia the level of the umbilicus. Herniated fat measures 1.1 x 1.5 by 3.0 cm and appears similar to prior CT. IMPRESSION: Fat containing umbilical hernia appears unchanged from prior CT. Electronically Signed    By: Darliss Cheney M.D.   On: 05/25/2022 22:08   CT Abdomen Pelvis W Contrast  Result Date: 05/25/2022 CLINICAL DATA:  Abdominal pain, known umbilical hernia EXAM: CT ABDOMEN AND PELVIS WITH CONTRAST TECHNIQUE: Multidetector CT imaging of the abdomen and pelvis was performed using the standard protocol following bolus administration of intravenous contrast. RADIATION DOSE REDUCTION: This exam was performed according to the departmental dose-optimization program which includes automated exposure control, adjustment of the mA and/or kV according to patient size and/or use of iterative reconstruction technique. CONTRAST:  OMNIPAQUE IOHEXOL 300 MG/ML  SOLN COMPARISON:  None Available. FINDINGS: Lower chest: Lung bases are free of acute infiltrate or sizable effusion. Hepatobiliary: Fatty infiltration of the liver is noted. The gallbladder is within normal limits. Pancreas: Unremarkable. No pancreatic ductal dilatation or surrounding inflammatory changes. Spleen: Normal in size without focal abnormality. Adrenals/Urinary Tract: Adrenal glands are within normal limits. Kidneys demonstrate a normal enhancement pattern bilaterally. No renal calculi or obstructive changes are noted. Ureters are within normal limits. The bladder is well distended. Stomach/Bowel: Colon demonstrates scattered diverticular change without evidence of diverticulitis. No obstructive changes are seen. The appendix is within normal limits. Small bowel and stomach are unremarkable. Vascular/Lymphatic: Aortic atherosclerosis. No enlarged abdominal or pelvic lymph nodes. Reproductive: Prostate is unremarkable. Other: Small fat containing umbilical hernia is noted. Some very mild inflammatory changes are seen surrounding the hernia although no bowel is noted within. No free fluid is noted. Musculoskeletal: Degenerative changes of lumbar spine are noted. IMPRESSION: Fat containing umbilical hernia with mild surrounding inflammatory changes.  These changes likely represent some incarceration of the herniated fat. No bowel incarceration is noted. Diverticulosis without diverticulitis. Fatty liver. Electronically Signed   By: Alcide Clever M.D.   On: 05/25/2022 21:54    Microbiology: Results for orders placed or performed during the hospital encounter of 10/03/19  Novel Coronavirus, NAA (Hosp order, Send-out to Ref Lab; TAT 18-24 hrs     Status: None   Collection Time: 10/03/19  5:48 PM   Specimen: Nasopharyngeal Swab; Respiratory  Result Value Ref Range Status   SARS-CoV-2, NAA NOT DETECTED NOT DETECTED Final    Comment: (NOTE) This nucleic acid amplification test was developed and its performance characteristics determined by [employer]. Nucleic acid amplification tests include PCR and TMA. This test has not been FDA cleared or approved. This test has been authorized by FDA under an Emergency Use Authorization (EUA). This test is only authorized for the duration of time the declaration that circumstances exist justifying the authorization of the emergency use of in vitro diagnostic tests for detection of SARS-CoV-2 virus and/or diagnosis of COVID-19 infection under section 564(b)(1) of the Act, 21 U.S.C. 161WRU-0(A) (1), unless the authorization is terminated or  revoked sooner. When diagnostic testing is negative, the possibility of a false negative result should be considered in the context of a patient's recent exposures and the presence of clinical signs and symptoms consistent with COVID-19. An individual without symptoms of COVID- 19 and who is not shedding SARS-CoV-2 vi rus would expect to have a negative (not detected) result in this assay. Performed At: [facility] [address] [place], [place] [id number] Jolene Schimke MD WU:[id number]    Coronavirus Source NASOPHARYNGEAL  Final    Comment: Performed at [facility], [address], [place], [place] [address]    Labs: CBC: Recent  Labs  Lab 05/25/22 1953 05/27/22 0123  WBC 6.9 7.7  HGB 13.8 14.0  HCT 43.6 45.2  MCV 98.0 101.3*  PLT 178 178   Basic Metabolic Panel: Recent Labs  Lab 05/25/22 1953 05/27/22 0123  NA 139 137  K 4.2 4.2  CL 99 98  CO2 29 26  GLUCOSE 152* 177*  BUN 24* 18  CREATININE 1.16 1.21  CALCIUM 9.8 9.1   Liver Function Tests: Recent Labs  Lab 05/25/22 1953  AST 17  ALT 15  ALKPHOS 72  BILITOT 0.4  PROT 7.8  ALBUMIN 4.5   CBG: Recent Labs  Lab 05/26/22 1435 05/26/22 1718 05/26/22 2047 05/27/22 0832 05/27/22 1214  GLUCAP 160* 148* 184* 166* 196*    Discharge time spent: greater than 30 minutes.  Signed: Meredeth Ide, MD [facility] 05/27/2022

## 2022-05-27 NOTE — Progress Notes (Signed)
1 Day Post-Op  Subjective: Some nausea this morning. Pain controlled. Requesting to go home.  Objective: Vital signs in last 24 hours: Temp:  [97 F (36.1 C)-97.7 F (36.5 C)] 97.7 F (36.5 C) (06/25 0840) Pulse Rate:  [55-92] 59 (06/25 0840) Resp:  [13-20] 20 (06/25 0840) BP: (108-153)/(53-78) 109/78 (06/25 0840) SpO2:  [90 %-98 %] 92 % (06/25 0840) FiO2 (%):  [0 %] 0 % (06/24 1431) Last BM Date : 05/26/22  Intake/Output from previous day: 06/24 0701 - 06/25 0700 In: 600 [I.V.:600] Out: 680 [Urine:680] Intake/Output this shift: Total I/O In: 580 [P.O.:580] Out: -   PE: General: resting comfortably, NAD Neuro: alert and oriented, no focal deficits Abdomen: soft, nondistended, umbilical dressing in place with a small amount of serosanguinous drainage Extremities: warm and well-perfused   Lab Results:  Recent Labs    05/25/22 1953 05/27/22 0123  WBC 6.9 7.7  HGB 13.8 14.0  HCT 43.6 45.2  PLT 178 178   BMET Recent Labs    05/25/22 1953 05/27/22 0123  NA 139 137  K 4.2 4.2  CL 99 98  CO2 29 26  GLUCOSE 152* 177*  BUN 24* 18  CREATININE 1.16 1.21  CALCIUM 9.8 9.1   PT/INR No results for input(s): "LABPROT", "INR" in the last 72 hours. CMP     Component Value Date/Time   NA 137 05/27/2022 0123   NA 142 03/21/2021 1217   K 4.2 05/27/2022 0123   CL 98 05/27/2022 0123   CO2 26 05/27/2022 0123   GLUCOSE 177 (H) 05/27/2022 0123   BUN 18 05/27/2022 0123   BUN 25 03/21/2021 1217   CREATININE 1.21 05/27/2022 0123   CALCIUM 9.1 05/27/2022 0123   PROT 7.8 05/25/2022 1953   ALBUMIN 4.5 05/25/2022 1953   AST 17 05/25/2022 1953   ALT 15 05/25/2022 1953   ALKPHOS 72 05/25/2022 1953   BILITOT 0.4 05/25/2022 1953   GFRNONAA >60 05/27/2022 0123   GFRAA >60 10/01/2019 1412   Lipase     Component Value Date/Time   LIPASE 37 05/25/2022 1953       Studies/Results: US Abdomen Limited  Result Date: 05/25/2022 CLINICAL DATA:  Possible umbilical  hernia. EXAM: ULTRASOUND ABDOMEN LIMITED COMPARISON:  CT abdomen and pelvis 05/25/2022. FINDINGS: There is a fat containing hernia the level of the umbilicus. Herniated fat measures 1.1 x 1.5 by 3.0 cm and appears similar to prior CT. IMPRESSION: Fat containing umbilical hernia appears unchanged from prior CT. Electronically Signed   By: Darliss Cheney M.D.   On: 05/25/2022 22:08   CT Abdomen Pelvis W Contrast  Result Date: 05/25/2022 CLINICAL DATA:  Abdominal pain, known umbilical hernia EXAM: CT ABDOMEN AND PELVIS WITH CONTRAST TECHNIQUE: Multidetector CT imaging of the abdomen and pelvis was performed using the standard protocol following bolus administration of intravenous contrast. RADIATION DOSE REDUCTION: This exam was performed according to the departmental dose-optimization program which includes automated exposure control, adjustment of the mA and/or kV according to patient size and/or use of iterative reconstruction technique. CONTRAST:  OMNIPAQUE IOHEXOL 300 MG/ML  SOLN COMPARISON:  None Available. FINDINGS: Lower chest: Lung bases are free of acute infiltrate or sizable effusion. Hepatobiliary: Fatty infiltration of the liver is noted. The gallbladder is within normal limits. Pancreas: Unremarkable. No pancreatic ductal dilatation or surrounding inflammatory changes. Spleen: Normal in size without focal abnormality. Adrenals/Urinary Tract: Adrenal glands are within normal limits. Kidneys demonstrate a normal enhancement pattern bilaterally. No renal calculi or  obstructive changes are noted. Ureters are within normal limits. The bladder is well distended. Stomach/Bowel: Colon demonstrates scattered diverticular change without evidence of diverticulitis. No obstructive changes are seen. The appendix is within normal limits. Small bowel and stomach are unremarkable. Vascular/Lymphatic: Aortic atherosclerosis. No enlarged abdominal or pelvic lymph nodes. Reproductive: Prostate is unremarkable.  Other: Small fat containing umbilical hernia is noted. Some very mild inflammatory changes are seen surrounding the hernia although no bowel is noted within. No free fluid is noted. Musculoskeletal: Degenerative changes of lumbar spine are noted. IMPRESSION: Fat containing umbilical hernia with mild surrounding inflammatory changes. These changes likely represent some incarceration of the herniated fat. No bowel incarceration is noted. Diverticulosis without diverticulitis. Fatty liver. Electronically Signed   By: Alcide Clever M.D.   On: 05/25/2022 21:54    Anti-infectives: Anti-infectives (From admission, onward)    Start     Dose/Rate Route Frequency Ordered Stop   05/26/22 1258  ceFAZolin (ANCEF) 2-4 GM/100ML-% IVPB  Status:  Discontinued       Note to Pharmacy: Kathrene Bongo D: cabinet override      05/26/22 1258 05/26/22 1303   05/26/22 1250  ceFAZolin (ANCEF) 2-4 GM/100ML-% IVPB       Note to Pharmacy: Crissie Sickles: cabinet override      05/26/22 1250 05/26/22 1825        Assessment/Plan 76 yo male with incarcerated fat-containing umbilical hernia, POD1 s/p primary repair. - Regular diet - Pain control - Ok for discharge home this afternoon if tolerating breakfast and lunch with no vomiting. - Will arrange postop follow up in 1 month     LOS: 1 day    Sophronia Simas, MD [facility] Surgery General, Hepatobiliary and Pancreatic Surgery 05/27/22 12:47 PM

## 2022-05-29 ENCOUNTER — Emergency Department (HOSPITAL_BASED_OUTPATIENT_CLINIC_OR_DEPARTMENT_OTHER)
Admission: EM | Admit: 2022-05-29 | Discharge: 2022-05-29 | Disposition: A | Discharge status: Home / Self Care | Payer: Federal, State, Local not specified - PPO | Attending: Emergency Medicine | Admitting: Emergency Medicine

## 2022-05-29 ENCOUNTER — Encounter (HOSPITAL_BASED_OUTPATIENT_CLINIC_OR_DEPARTMENT_OTHER): Payer: Self-pay | Admitting: Emergency Medicine

## 2022-05-29 ENCOUNTER — Other Ambulatory Visit (HOSPITAL_BASED_OUTPATIENT_CLINIC_OR_DEPARTMENT_OTHER): Payer: Self-pay

## 2022-05-29 ENCOUNTER — Other Ambulatory Visit: Payer: Self-pay

## 2022-05-29 DIAGNOSIS — E119 Type 2 diabetes mellitus without complications: Secondary | ICD-10-CM | POA: Insufficient documentation

## 2022-05-29 DIAGNOSIS — I2510 Atherosclerotic heart disease of native coronary artery without angina pectoris: Secondary | ICD-10-CM | POA: Diagnosis not present

## 2022-05-29 DIAGNOSIS — Z951 Presence of aortocoronary bypass graft: Secondary | ICD-10-CM | POA: Insufficient documentation

## 2022-05-29 DIAGNOSIS — Z87891 Personal history of nicotine dependence: Secondary | ICD-10-CM | POA: Insufficient documentation

## 2022-05-29 DIAGNOSIS — K429 Umbilical hernia without obstruction or gangrene: Secondary | ICD-10-CM | POA: Insufficient documentation

## 2022-05-29 DIAGNOSIS — Z7982 Long term (current) use of aspirin: Secondary | ICD-10-CM | POA: Diagnosis not present

## 2022-05-29 DIAGNOSIS — Z7984 Long term (current) use of oral hypoglycemic drugs: Secondary | ICD-10-CM | POA: Insufficient documentation

## 2022-05-29 DIAGNOSIS — T7840XA Allergy, unspecified, initial encounter: Secondary | ICD-10-CM | POA: Insufficient documentation

## 2022-05-29 DIAGNOSIS — I10 Essential (primary) hypertension: Secondary | ICD-10-CM | POA: Diagnosis not present

## 2022-05-29 DIAGNOSIS — R21 Rash and other nonspecific skin eruption: Secondary | ICD-10-CM | POA: Diagnosis not present

## 2022-05-29 DIAGNOSIS — Z79899 Other long term (current) drug therapy: Secondary | ICD-10-CM | POA: Insufficient documentation

## 2022-05-29 DIAGNOSIS — Z4801 Encounter for change or removal of surgical wound dressing: Secondary | ICD-10-CM | POA: Diagnosis not present

## 2022-05-29 MED ORDER — PREDNISONE 10 MG PO TABS
40.0000 mg | ORAL_TABLET | Freq: Every day | ORAL | 0 refills | Status: DC
  Filled 2022-05-29: qty 20, 5d supply, fill #0

## 2022-05-29 MED ORDER — FAMOTIDINE 20 MG PO TABS
20.0000 mg | ORAL_TABLET | Freq: Two times a day (BID) | ORAL | 0 refills | Status: DC
Start: 2022-05-29 — End: 2022-07-18
  Filled 2022-05-29: qty 30, 15d supply, fill #0

## 2022-06-01 DIAGNOSIS — L259 Unspecified contact dermatitis, unspecified cause: Secondary | ICD-10-CM | POA: Diagnosis not present

## 2022-06-04 DIAGNOSIS — E1169 Type 2 diabetes mellitus with other specified complication: Secondary | ICD-10-CM | POA: Diagnosis not present

## 2022-06-04 DIAGNOSIS — E7800 Pure hypercholesterolemia, unspecified: Secondary | ICD-10-CM | POA: Diagnosis not present

## 2022-06-04 DIAGNOSIS — I10 Essential (primary) hypertension: Secondary | ICD-10-CM | POA: Diagnosis not present

## 2022-06-14 DIAGNOSIS — Z09 Encounter for follow-up examination after completed treatment for conditions other than malignant neoplasm: Secondary | ICD-10-CM | POA: Diagnosis not present

## 2022-07-15 ENCOUNTER — Other Ambulatory Visit: Payer: Self-pay | Admitting: Internal Medicine

## 2022-07-18 ENCOUNTER — Other Ambulatory Visit (HOSPITAL_BASED_OUTPATIENT_CLINIC_OR_DEPARTMENT_OTHER): Payer: Self-pay

## 2022-07-18 DIAGNOSIS — E1169 Type 2 diabetes mellitus with other specified complication: Secondary | ICD-10-CM | POA: Diagnosis not present

## 2022-07-18 DIAGNOSIS — E7800 Pure hypercholesterolemia, unspecified: Secondary | ICD-10-CM | POA: Diagnosis not present

## 2022-07-18 DIAGNOSIS — I10 Essential (primary) hypertension: Secondary | ICD-10-CM | POA: Diagnosis not present

## 2022-07-18 DIAGNOSIS — E039 Hypothyroidism, unspecified: Secondary | ICD-10-CM | POA: Diagnosis not present

## 2022-07-18 MED ORDER — FAMOTIDINE 20 MG PO TABS
20.0000 mg | ORAL_TABLET | Freq: Two times a day (BID) | ORAL | 3 refills | Status: DC
  Filled 2022-07-18: qty 180, 90d supply, fill #0

## 2022-07-20 ENCOUNTER — Ambulatory Visit: Payer: Federal, State, Local not specified - PPO | Admitting: Internal Medicine

## 2022-07-24 ENCOUNTER — Ambulatory Visit: Payer: Federal, State, Local not specified - PPO | Admitting: Internal Medicine

## 2022-07-24 NOTE — Progress Notes (Deleted)
Subjective:     Patient ID: Jeremiah [name], male   DOB: [birth year],    MRN: [id number]     Brief patient profile:  69  yowm MM/quit smoking 12/15/16 with onset of doe x 2008 and GOLD III criteria confirmed 10/28/2018 with restictive component due to obesity / prev eval for osa but could not tol cpap.  After treatment with a Lama/lama he improved to GOLD II criteria as of 02/05/2019     History of Present Illness   02/12/2017 1st [facility] office visit/ Jeremiah [name]  Re ? Copd  Chief Complaint  Patient presents with   Pulmonary Consult    Referred by Dr. Dorthy Cooler. Pt c/o SOB for the past 10 yrs. He gets SOB when running and working "which I don't do anymore".   MMRC2 = can't walk a nl pace on a flat grade s sob but does fine slow and flat eg shopping / limited also by R chronic ankle pain p fx rec Stop lopressor (metaprolol) and start bisoprolol 5 mg twice daily  If face/eye symptoms  worse or fever go back to ER  Please schedule a follow up office visit in 4 weeks, sooner if needed with pfts on return     Date of Admission: 12/25/2018                      Date of Discharge: 12/26/2018   Discharge Diagnoses/Problem List:  CHF exacerbation Symptomatic anemia COPD CAD DM-II HTN HLD Hypothyroidism OSA    Issues for Follow Up:  CHF - patient on bisoprolol and lasix at home.  Consider increasing lasix dose and adding ARB Anemia - patient anemic with low ferritin.  Get CBC Colonoscopy - patient overdue for colonoscopy.  Important in setting of anemia.   COPD - on duonebs at home PRN. Wheezing present on exam. Consider adding a controller medication.  Diabetes - consider discontinuing glimepiride given      01/01/2019  Extended post hosp f/u  ov/Jeremiah [name] re: copd/ chf/ anemia - not on maint rx, just duoneb prn  Chief Complaint  Patient presents with   Hospitalization Follow-up    Breathing has improved. He is now on 4lpm o2 24/7. CT Angiogram done today.    Dyspnea:  MMRC4  = sob if tries  to leave home or while getting dressed  Even on 02 but ok at rest sitting  Cough: min dry  Sleeping: 45 degrees  SABA use: duoneb helps the most 02: 4lpm 24 /7  rec Plan A = Automatic = performist 20 mcg twice daily in neb with yupelri 175 mcg each am  Plan B = Backup Only use your albuterol nebulizer as a rescue medication  Goal for 02 is to keep sats above 90% at all times so may be able to adjust back down to 2lpm at rest to save on protable tank  and turn up with walking to whatever setting needed to reach this goal. Please schedule a follow up office visit in 4 weeks, sooner if needed  with all medications /inhalers/ solutions in hand so we can verify exactly what you are taking. This includes all medications from all doctors and over the counters  - PFTs on return     02/05/2019  f/u ov/Jeremiah [name] re: GOLD II with reversible component  Chief Complaint  Patient presents with   Follow-up    PFT's done today. Breathing is doing well. He has not had to use his albuterol neb.  Dyspnea:  MMRC3 = can't walk 100 yards even at a slow pace at a flat grade s stopping due to sob   Cough: none Sleeping: sleeping 30 degrees  SABA use: no saba  02: 4lpm walking / 3 rest/ 3 sleeping  Rec No change nebulizers Keep working on weight loss > be sure you check your 02 saturations at rest/ activity with goal of keeping above 90% at all times Please schedule a follow up visit in 3 months but call sooner if needed     07/20/2021  f/u ov/Jeremiah [name] re: copd GOLD II/ MO with hypercabia/hypoxemia  maint on performist only in am  Chief Complaint  Patient presents with   Follow-up    Overdue follow-up. Doing well   Dyspnea:  shops at [employer]/ ht s 02 x sev aisles  = MMRC3 = can't walk 100 yards even at a slow pace at a flat grade s stopping due to sob   Cough: none  Sleeping:  bed is flat with 2 pillows SABA use: not using  02: 3 lpm hs and not typically checking/ titrating with exertion Covid status:   vax x 3   Rec Add yupelri 175 mcg daily with your perforomist  and Add another performist 12 hours later if you are planning activity for the evening Make sure you check your oxygen saturation  at your highest level of activity  to be sure it stays over 90%    07/24/2022  f/u ov/Jeremiah [name] re: ***   maint on ***  No chief complaint on file.   Dyspnea:  *** Cough: *** Sleeping: *** SABA use: *** 02: *** Covid status:   ***   No obvious day to day or daytime variability or assoc excess/ purulent sputum or mucus plugs or hemoptysis or cp or chest tightness, subjective wheeze or overt sinus or hb symptoms.   *** without nocturnal  or early am exacerbation  of respiratory  c/o's or need for noct saba. Also denies any obvious fluctuation of symptoms with weather or environmental changes or other aggravating or alleviating factors except as outlined above   No unusual exposure hx or h/o childhood pna/ asthma or knowledge of premature birth.  Current Allergies, Complete Past Medical History, Past Surgical History, Family History, and Social History were reviewed in [employer] record.  ROS  The following are not active complaints unless bolded Hoarseness, sore throat, dysphagia, dental problems, itching, sneezing,  nasal congestion or discharge of excess mucus or purulent secretions, ear ache,   fever, chills, sweats, unintended wt loss or wt gain, classically pleuritic or exertional cp,  orthopnea pnd or arm/hand swelling  or leg swelling, presyncope, palpitations, abdominal pain, anorexia, nausea, vomiting, diarrhea  or change in bowel habits or change in bladder habits, change in stools or change in urine, dysuria, hematuria,  rash, arthralgias, visual complaints, headache, numbness, weakness or ataxia or problems with walking or coordination,  change in mood or  memory.        No outpatient medications have been marked as taking for the 07/24/22 encounter (Appointment) with Tanda Rockers, MD.                      Objective:   Physical Exam  Wts  07/24/2022        ***  07/20/2021       238  02/05/2019         251  01/01/2019  255  10/28/2018     252  08/26/2017       246  05/02/2017       248  04/01/2017      244   02/12/17 248 lb (112.5 kg)  02/11/17 239 lb (108.4 kg)  01/02/17 245 lb (111.1 kg)    Vital signs reviewed  07/24/2022  - Note at rest 02 sats  ***% on ***   General appearance:    ***      Min barr /mild chronic venous stasis changes both LEs              Assessment:

## 2022-08-08 ENCOUNTER — Ambulatory Visit: Payer: Federal, State, Local not specified - PPO | Admitting: Internal Medicine

## 2022-08-08 NOTE — Progress Notes (Deleted)
Subjective:     Patient ID: Jeremiah [name], male   DOB: Apr 13, 1946,    MRN: [id number]     Brief patient profile:  30 yowm MM/quit smoking 12/15/16 with onset of doe x 2008 and GOLD III criteria confirmed 10/28/2018 with restictive component due to obesity / prev eval for osa but could not tol cpap.  After treatment with a Lama/lama he improved to GOLD II criteria as of 02/05/2019     History of Present Illness   02/12/2017 1st [facility] office visit/ Timoth Schara  Re ? Copd  Chief Complaint  Patient presents with   Pulmonary Consult    Referred by Dr. Dorthy Cooler. Pt c/o SOB for the past 10 yrs. He gets SOB when running and working "which I don't do anymore".   MMRC2 = can't walk a nl pace on a flat grade s sob but does fine slow and flat eg shopping / limited also by R chronic ankle pain p fx rec Stop lopressor (metaprolol) and start bisoprolol 5 mg twice daily  If face/eye symptoms  worse or fever go back to ER  Please schedule a follow up office visit in 4 weeks, sooner if needed with pfts on return     Date of Admission: 12/25/2018                      Date of Discharge: 12/26/2018   Discharge Diagnoses/Problem List:  CHF exacerbation Symptomatic anemia COPD CAD DM-II HTN HLD Hypothyroidism OSA    Issues for Follow Up:  CHF - patient on bisoprolol and lasix at home.  Consider increasing lasix dose and adding ARB Anemia - patient anemic with low ferritin.  Get CBC Colonoscopy - patient overdue for colonoscopy.  Important in setting of anemia.   COPD - on duonebs at home PRN. Wheezing present on exam. Consider adding a controller medication.  Diabetes - consider discontinuing glimepiride given      01/01/2019  Extended post hosp f/u  ov/Sherice Ijames re: copd/ chf/ anemia - not on maint rx, just duoneb prn  Chief Complaint  Patient presents with   Hospitalization Follow-up    Breathing has improved. He is now on 4lpm o2 24/7. CT Angiogram done today.    Dyspnea:  MMRC4  = sob if tries to  leave home or while getting dressed  Even on 02 but ok at rest sitting  Cough: min dry  Sleeping: 45 degrees  SABA use: duoneb helps the most 02: 4lpm 24 /7  rec Plan A = Automatic = performist 20 mcg twice daily in neb with yupelri 175 mcg each am  Plan B = Backup Only use your albuterol nebulizer as a rescue medication  Goal for 02 is to keep sats above 90% at all times so may be able to adjust back down to 2lpm at rest to save on protable tank  and turn up with walking to whatever setting needed to reach this goal. Please schedule a follow up office visit in 4 weeks, sooner if needed  with all medications /inhalers/ solutions in hand so we can verify exactly what you are taking. This includes all medications from all doctors and over the counters  - PFTs on return     02/05/2019  f/u ov/Mataio Mele re: GOLD II with reversible component  Chief Complaint  Patient presents with   Follow-up    PFT's done today. Breathing is doing well. He has not had to use his albuterol neb.   Dyspnea:  MMRC3 = can't walk 100 yards even at a slow pace at a flat grade s stopping due to sob   Cough: none Sleeping: sleeping 30 degrees  SABA use: no saba  02: 4lpm walking / 3 rest/ 3 sleeping  Rec No change nebulizers Keep working on weight loss > be sure you check your 02 saturations at rest/ activity with goal of keeping above 90% at all times Please schedule a follow up visit in 3 months but call sooner if needed     07/20/2021  f/u ov/Juno Alers re: copd GOLD 2/ MO with hypercabia/hypoxemia  maint on performist only in am  Chief Complaint  Patient presents with   Follow-up    Overdue follow-up. Doing well   Dyspnea:  shops at [employer]/ ht s 02 x sev aisles  = MMRC3 = can't walk 100 yards even at a slow pace at a flat grade s stopping due to sob   Cough: none  Sleeping:  bed is flat with 2 pillows SABA use: not using  02: 3 lpm hs and not typically checking/ titrating with exertion Covid status:   vax x 3   Rec Add yupelri 175 mcg daily with your perforomist  and Add another performist 12 hours later if you are planning activity for the evening Make sure you check your oxygen saturation  at your highest level of activity  to be sure it stays over 90%    08/08/2022  f/u ov/Grady Lucci re: ***   maint on ***  No chief complaint on file.   Dyspnea:  *** Cough: *** Sleeping: *** SABA use: *** 02: *** Covid status:   ***   No obvious day to day or daytime variability or assoc excess/ purulent sputum or mucus plugs or hemoptysis or cp or chest tightness, subjective wheeze or overt sinus or hb symptoms.   *** without nocturnal  or early am exacerbation  of respiratory  c/o's or need for noct saba. Also denies any obvious fluctuation of symptoms with weather or environmental changes or other aggravating or alleviating factors except as outlined above   No unusual exposure hx or h/o childhood pna/ asthma or knowledge of premature birth.  Current Allergies, Complete Past Medical History, Past Surgical History, Family History, and Social History were reviewed in [employer] record.  ROS  The following are not active complaints unless bolded Hoarseness, sore throat, dysphagia, dental problems, itching, sneezing,  nasal congestion or discharge of excess mucus or purulent secretions, ear ache,   fever, chills, sweats, unintended wt loss or wt gain, classically pleuritic or exertional cp,  orthopnea pnd or arm/hand swelling  or leg swelling, presyncope, palpitations, abdominal pain, anorexia, nausea, vomiting, diarrhea  or change in bowel habits or change in bladder habits, change in stools or change in urine, dysuria, hematuria,  rash, arthralgias, visual complaints, headache, numbness, weakness or ataxia or problems with walking or coordination,  change in mood or  memory.        No outpatient medications have been marked as taking for the 08/08/22 encounter (Appointment) with Tanda Rockers, MD.                      Objective:   Physical Exam  Wts  08/08/2022        ***  07/20/2021       238  02/05/2019         251  01/01/2019       255  10/28/2018     252  08/26/2017       246  05/02/2017       248  04/01/2017      244   02/12/17 248 lb (112.5 kg)  02/11/17 239 lb (108.4 kg)  01/02/17 245 lb (111.1 kg)    Vital signs reviewed  08/08/2022  - Note at rest 02 sats  ***% on ***   General appearance:    ***      Min barr /mild chronic venous stasis changes both LEs              Assessment:

## 2022-08-15 DIAGNOSIS — R112 Nausea with vomiting, unspecified: Secondary | ICD-10-CM | POA: Diagnosis not present

## 2022-08-15 DIAGNOSIS — R109 Unspecified abdominal pain: Secondary | ICD-10-CM | POA: Diagnosis not present

## 2022-08-15 DIAGNOSIS — E1169 Type 2 diabetes mellitus with other specified complication: Secondary | ICD-10-CM | POA: Diagnosis not present

## 2022-08-21 ENCOUNTER — Encounter: Payer: Self-pay | Admitting: Internal Medicine

## 2022-08-21 ENCOUNTER — Ambulatory Visit: Payer: Federal, State, Local not specified - PPO | Admitting: Internal Medicine

## 2022-08-21 DIAGNOSIS — Z87891 Personal history of nicotine dependence: Secondary | ICD-10-CM | POA: Insufficient documentation

## 2022-08-21 DIAGNOSIS — J9611 Chronic respiratory failure with hypoxia: Secondary | ICD-10-CM

## 2022-08-21 DIAGNOSIS — J9612 Chronic respiratory failure with hypercapnia: Secondary | ICD-10-CM

## 2022-08-21 DIAGNOSIS — J449 Chronic obstructive pulmonary disease, unspecified: Secondary | ICD-10-CM

## 2022-08-21 NOTE — Progress Notes (Signed)
Subjective:     Patient ID: Jeremiah [name], male   DOB: 02-03-1946    MRN: [id number]     Brief patient profile:  32 yowm MM/quit smoking 12/15/16 with onset of doe x 2008 and GOLD III criteria confirmed 10/28/2018 with restictive component due to obesity / prev eval for osa but could not tol cpap.  After treatment with a Lama/lama he improved to GOLD II criteria as of 02/05/2019     History of Present Illness   02/12/2017 1st [facility] office visit/ Jeremiah [name]  Re ? Copd  Chief Complaint  Patient presents with   Pulmonary Consult    Referred by Dr. Dorthy Cooler. Pt c/o SOB for the past 10 yrs. He gets SOB when running and working "which I don't do anymore".   MMRC2 = can't walk a nl pace on a flat grade s sob but does fine slow and flat eg shopping / limited also by R chronic ankle pain p fx rec Stop lopressor (metaprolol) and start bisoprolol 5 mg twice daily  If face/eye symptoms  worse or fever go back to ER  Please schedule a follow up office visit in 4 weeks, sooner if needed with pfts on return     Date of Admission: 12/25/2018                      Date of Discharge: 12/26/2018   Discharge Diagnoses/Problem List:  CHF exacerbation Symptomatic anemia COPD CAD DM-II HTN HLD Hypothyroidism OSA    Issues for Follow Up:  CHF - patient on bisoprolol and lasix at home.  Consider increasing lasix dose and adding ARB Anemia - patient anemic with low ferritin.  Get CBC Colonoscopy - patient overdue for colonoscopy.  Important in setting of anemia.   COPD - on duonebs at home PRN. Wheezing present on exam. Consider adding a controller medication.  Diabetes - consider discontinuing glimepiride given      01/01/2019  Extended post hosp f/u  ov/Jeremiah [name] re: copd/ chf/ anemia - not on maint rx, just duoneb prn  Chief Complaint  Patient presents with   Hospitalization Follow-up    Breathing has improved. He is now on 4lpm o2 24/7. CT Angiogram done today.    Dyspnea:  MMRC4  = sob if tries to  leave home or while getting dressed  Even on 02 but ok at rest sitting  Cough: min dry  Sleeping: 45 degrees  SABA use: duoneb helps the most 02: 4lpm 24 /7  rec Plan A = Automatic = performist 20 mcg twice daily in neb with yupelri 175 mcg each am  Plan B = Backup Only use your albuterol nebulizer as a rescue medication  Goal for 02 is to keep sats above 90% at all times so may be able to adjust back down to 2lpm at rest to save on protable tank  and turn up with walking to whatever setting needed to reach this goal. Please schedule a follow up office visit in 4 weeks, sooner if needed  with all medications /inhalers/ solutions in hand so we can verify exactly what you are taking. This includes all medications from all doctors and over the counters  - PFTs on return     02/05/2019  f/u ov/Jeremiah [name] re: GOLD II with reversible component  Chief Complaint  Patient presents with   Follow-up    PFT's done today. Breathing is doing well. He has not had to use his albuterol neb.   Dyspnea:  MMRC3 = can't walk 100 yards even at a slow pace at a flat grade s stopping due to sob   Cough: none Sleeping: sleeping 30 degrees  SABA use: no saba  02: 4lpm walking / 3 rest/ 3 sleeping  Rec No change nebulizers Keep working on weight loss > be sure you check your 02 saturations at rest/ activity with goal of keeping above 90% at all times Please schedule a follow up visit in 3 months but call sooner if needed     07/20/2021  f/u ov/Jeremiah [name] re: copd GOLD 2/ MO with hypercabia/hypoxemia  maint on performist only in am  Chief Complaint  Patient presents with   Follow-up    Overdue follow-up. Doing well   Dyspnea:  shops at [employer]/ ht s 02 x sev aisles  = MMRC3 = can't walk 100 yards even at a slow pace at a flat grade s stopping due to sob   Cough: none  Sleeping:  bed is flat with 2 pillows SABA use: not using  02: 3 lpm hs and not typically checking/ titrating with exertion Covid status:   vax x 3   Rec Add yupelri 175 mcg daily with your perforomist  and Add another performist 12 hours later if you are planning activity for the evening Make sure you check your oxygen saturation  at your highest level of activity  to be sure it stays over 90%    08/21/2022  f/u ov/Jeremiah [name] re: copd gold 2/ MO maint on performist/yupelri    Chief Complaint  Patient presents with   Follow-up    Pt states he had a hernia operation and colon operation as well. Pt is still using 3L's of oxygen at night.    Dyspnea:  very active remodeling house / doing rowing and bicycle at [place] s 02 or monitor Cough: none  Sleeping: recliner almost flat  SABA use: none  02: 3lpm hs and prn seldom daytime      No obvious day to day or daytime variability or assoc excess/ purulent sputum or mucus plugs or hemoptysis or cp or chest tightness, subjective wheeze or overt sinus  symptoms.   Sleeping  without nocturnal  or early am exacerbation  of respiratory  c/o's or need for noct saba. Also denies any obvious fluctuation of symptoms with weather or environmental changes or other aggravating or alleviating factors except as outlined above   No unusual exposure hx or h/o childhood pna/ asthma or knowledge of premature birth.  Current Allergies, Complete Past Medical History, Past Surgical History, Family History, and Social History were reviewed in [employer] record.  ROS  The following are not active complaints unless bolded Hoarseness, sore throat, dysphagia, dental problems, itching, sneezing,  nasal congestion or discharge of excess mucus or purulent secretions, ear ache,   fever, chills, sweats, unintended wt loss or wt gain, classically pleuritic or exertional cp,  orthopnea pnd or arm/hand swelling  or leg swelling, presyncope, palpitations, abdominal pain p IH repair, anorexia, nausea, vomiting, diarrhea  or change in bowel habits or change in bladder habits, change in stools or change in urine,  dysuria, hematuria,  rash, arthralgias, visual complaints, headache, numbness, weakness or ataxia or problems with walking or coordination,  change in mood or  memory.        Current Meds  Medication Sig   albuterol (PROVENTIL) (2.5 MG/3ML) 0.083% nebulizer solution Take 3 mLs (2.5 mg total) by nebulization every 4 (four) hours as needed  for wheezing or shortness of breath.   arformoterol (BROVANA) 15 MCG/2ML NEBU Take 2 mLs (15 mcg total) by nebulization 2 (two) times daily.   aspirin 81 MG EC tablet Take 81 mg by mouth daily.   atorvastatin (LIPITOR) 80 MG tablet Take 80 mg by mouth daily.   betamethasone dipropionate 0.05 % cream Apply 1 Application topically 2 (two) times daily as needed for dry skin.   bisoprolol (ZEBETA) 5 MG tablet TAKE 1 TABLET(5 MG) BY MOUTH DAILY (Patient taking differently: Take 5 mg by mouth daily.)   colchicine 0.6 MG tablet Take 0.6 mg by mouth daily as needed (gout flare up).   empagliflozin (JARDIANCE) 25 MG TABS tablet TAKE 1 TABLET(25 MG) BY MOUTH DAILY BEFORE BREAKFAST (Patient taking differently: Take 25 mg by mouth daily.)   ezetimibe (ZETIA) 10 MG tablet TAKE 1 TABLET(10 MG) BY MOUTH DAILY   famotidine (PEPCID) 20 MG tablet Take 1 tablet (20 mg total) by mouth 2 (two) times daily.   ferrous sulfate (FEROSUL) 325 (65 FE) MG tablet TAKE 1 TABLET BY MOUTH DAILY WITH BREAKFAST (Patient taking differently: Take 325 mg by mouth daily with breakfast.)   furosemide (LASIX) 40 MG tablet Take 1 tablet (40 mg total) by mouth daily.   glimepiride (AMARYL) 4 MG tablet Take 4 mg by mouth daily with breakfast.   HYDROcodone-acetaminophen (NORCO/VICODIN) 5-325 MG tablet Take 1-2 tablets by mouth every 6 (six) hours as needed for moderate pain.   levothyroxine (SYNTHROID, LEVOTHROID) 88 MCG tablet Take 88 mcg by mouth daily before breakfast.   losartan (COZAAR) 25 MG tablet Take 25 mg by mouth daily.   metFORMIN (GLUCOPHAGE-XR) 500 MG 24 hr tablet Take 2,000 mg by mouth  daily.   OXYGEN Inhale 3 L into the lungs at bedtime. 4 lpm with 24/7 AHC   pantoprazole (PROTONIX) 40 MG tablet Take 40 mg by mouth 2 (two) times daily.   revefenacin (YUPELRI) 175 MCG/3ML nebulizer solution Inhale 3 mLs (175 mcg total) into the lungs every morning.   triamcinolone cream (KENALOG) 0.5 % Apply 1 Application topically 2 (two) times daily.   [DISCONTINUED] predniSONE (DELTASONE) 10 MG tablet Take 4 tablets (40 mg total) by mouth daily.                      Objective:   Physical Exam  Wts  08/21/2022       228  07/20/2021       238  02/05/2019         251  01/01/2019       255  10/28/2018     252  08/26/2017       246  05/02/2017       248  04/01/2017      244   02/12/17 248 lb (112.5 kg)  02/11/17 239 lb (108.4 kg)  01/02/17 245 lb (111.1 kg)    Vital signs reviewed  08/21/2022  - Note at rest 02 sats  90% on RA   General appearance:    obese wm nad     HEENT : Oropharynx  clear      NECK :  without  apparent JVD/ palpable Nodes/TM    LUNGS: no acc muscle use,  Min barrel  contour chest wall with bilateral  slightly decreased bs s audible wheeze and  without cough on insp or exp maneuvers and min  Hyperresonant  to  percussion bilaterally    CV:  RRR  no s3  or murmur or increase in P2, and trace pitting both ankles   ABD:  obese soft   with pos end  insp Hoover's  in the supine position.  No bruits or organomegaly appreciated   MS:  Nl gait/ ext warm without deformities Or obvious joint restrictions  calf tenderness, cyanosis or clubbing     SKIN: warm and dry with mild venous stasis changes   NEURO:  alert, approp, nl sensorium with  no motor or cerebellar deficits apparent.                           Assessment:

## 2022-08-21 NOTE — Assessment & Plan Note (Signed)
Referred for lung cancer screening 08/21/2022 >>>   Low-dose CT lung cancer screening is recommended for patients who are 15-75 years of age with a 20+ pack-year history of smoking and who are currently smoking or quit <=15 years ago. No coughing up blood  No unintentional weight loss of > 15 pounds in the last 6 months - pt is eligible for scanning yearly until age 59> referred  Discussed in detail all the  indications, usual  risks and alternatives  relative to the benefits with patient who agrees to proceed with w/u as outlined.            Each maintenance medication was reviewed in detail including emphasizing most importantly the difference between maintenance and prns and under what circumstances the prns are to be triggered using an action plan format where appropriate.  Total time for H and P, chart review, counseling, reviewing 02/neb device(s) and generating customized AVS unique to this office visit / same day charting =  25 min

## 2022-08-21 NOTE — Patient Instructions (Signed)
My office will be contacting you by phone for referral to lung cancer screening  program  - if you don't hear back from my office within one week please call us back or notify us thru MyChart and we'll address it right away.   Make sure you check your oxygen saturation  AT  your highest level of activity (not after you stop)   to be sure it stays over 90% and adjust  02 flow upward to maintain this level if needed but remember to turn it back to previous settings when you stop (to conserve your supply).   Please schedule a follow up visit in 12  months but call sooner if needed

## 2022-08-21 NOTE — Assessment & Plan Note (Signed)
Quit smoking 12/2016 Spirometry 02/12/2017  FEV1 0.79 (20%)  Ratio 45 but f/v loop is junk - trial of selective BB 02/12/2017  - PFT's  04/01/2017  FEV1 1.29 ( 44% ) ratio 55  p no % improvement from saba p ?  prior to study with DLCO  65/67 % corrects to 85  % for alv volume  - 05/02/2017  After extensive coaching HFA effectiveness =    75% > try bevespi samples x 2 weeks  > no objective change so did not continue  - Referred to rehab 08/26/2017 > improved - PFT's  10/28/2018  FEV1 1.25 (43 % ) ratio 59  p 7 % improvement from saba p nothing prior to study with DLCO  67 % corrects to 88 % for alv volume  With mild curvature/ insp truncation s plateau  - alpha one screen 10/28/2018   MM  Level 144  - 01/01/2019 changed to yupelri/ performist  PFT's  02/05/2019  FEV1 1.53 (53 % ) ratio 0.76  p 20 % improvement from saba p performist/yupelri prior to study with DLCO  61/80c % corrects to 120  % for alv volume   -note follow-up flow volume loop suggests a non-fixed and inspiratory truncation c/w the effects of morbid obesity on the upper airway  in patients with a history of sleep apnea though no classic sawtooth pattern is present.  - added yupelri back 07/20/2021 >>> reported better ex tol 08/21/2022   Pt is Group B in terms of symptom/risk and laba/lama therefore appropriate rx at this point >>>  Continue formoterol and yupelri

## 2022-08-21 NOTE — Assessment & Plan Note (Signed)
HCO3  03/02/17 = 37  -  04/01/2017 : Patient Saturations on Room Air at Rest = 96%  And  while Ambulating = 88% Patient Saturations on 2 Liters of oxygen while Ambulating = 94% - HCO3  12/26/18  = 34  - portable 02 eval  10/28.22 neeeded 4lpm Pulsed to maintain sats walking so rec 2 refillable ML6 tanks/ 3lpm pulsed sitting  - HC03   05/27/22  = 26   As of 08/21/2022 using 3lpm hs and prn daytime   Advise: Make sure you check your oxygen saturation  AT  your highest level of activity (not after you stop)   to be sure it stays over 90% and adjust  02 flow upward to maintain this level if needed but remember to turn it back to previous settings when you stop (to conserve your supply).

## 2022-09-07 ENCOUNTER — Ambulatory Visit: Payer: Federal, State, Local not specified - PPO | Attending: Internal Medicine | Admitting: Internal Medicine

## 2022-09-07 ENCOUNTER — Encounter: Payer: Self-pay | Admitting: Internal Medicine

## 2022-09-07 VITALS — BP 110/56 | HR 72 | Ht 68.0 in | Wt 215.0 lb

## 2022-09-07 DIAGNOSIS — Z951 Presence of aortocoronary bypass graft: Secondary | ICD-10-CM

## 2022-09-07 DIAGNOSIS — R55 Syncope and collapse: Secondary | ICD-10-CM

## 2022-09-07 DIAGNOSIS — I5042 Chronic combined systolic (congestive) and diastolic (congestive) heart failure: Secondary | ICD-10-CM | POA: Diagnosis not present

## 2022-09-07 DIAGNOSIS — I25810 Atherosclerosis of coronary artery bypass graft(s) without angina pectoris: Secondary | ICD-10-CM | POA: Diagnosis not present

## 2022-09-07 DIAGNOSIS — I10 Essential (primary) hypertension: Secondary | ICD-10-CM | POA: Diagnosis not present

## 2022-09-07 NOTE — Patient Instructions (Signed)
Medication Instructions:  Your physician recommends that you continue on your current medications as directed. Please refer to the Current Medication list given to you today.  *If you need a refill on your cardiac medications before your next appointment, please call your pharmacy*  Follow-Up: At [facility], you and your health needs are our priority.  As part of our continuing mission to provide you with exceptional heart care, we have created designated Provider Care Teams.  These Care Teams include your primary Cardiologist (physician) and Advanced Practice Providers (APPs -  Physician Assistants and Nurse Practitioners) who all work together to provide you with the care you need, when you need it.  We recommend signing up for the patient portal called "MyChart".  Sign up information is provided on this After Visit Summary.  MyChart is used to connect with patients for Virtual Visits (Telemedicine).  Patients are able to view lab/test results, encounter notes, upcoming appointments, etc.  Non-urgent messages can be sent to your provider as well.   To learn more about what you can do with MyChart, go to [email or web address].    Your next appointment:   6 month(s)  The format for your next appointment:   In Person  Provider:   Pixie Casino, MD

## 2022-09-07 NOTE — Progress Notes (Signed)
OFFICE NOTE  Chief Complaint:  Routine follow-up  Primary Care Physician: [name], MD  HPI:  Jeremiah [name] is a 76 y.o. 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 He is originally from [place] and his wife who is accompanying him today is originally from [place]. Mr. Dirocco had an MI in 2006 and underwent coronary artery bypass grafting with a LIMA to LAD, SVG to OM1, and SVG to RPDA. He did fairly well with this however 2015 was having chest discomfort. He underwent a nuclear stress test which showed possible reversible ischemia. Therefore he was referred for cardiac catheterization at [facility] [place]. That demonstrated all 3 patent bypass grafts with multivessel coronary disease. At that time he was also having episodes of palpitations and a monitor demonstrated PSVT. He was scheduled for possible ablation but is had chronic problems with an ankle fracture and nonhealing wound. He therefore never underwent ablation. He was placed on digoxin and has been on metoprolol but stopped the digoxin at some point in the past. He reports over the past 8-12 months she's had no further palpitations. He also has a history of dyslipidemia, hypothyroidism, and borderline diabetes. Recently he had a sleep study through [facility] which demonstrated severe obstructive sleep apnea and BiPAP was recommended with settings of 19/14 cm water pressure. Currently he denies any chest pain or worsening shortness of breath. His last echocardiogram from his cardiologist in [place] was a 2015 which showed an EF of 58% and no significant valvular disease.  07/26/2016  Jeremiah [name] returns today for follow-up. He was recently seen in the emergency department in the beginning of June for chest pain. This was associated with tachycardia and probable SVT. Troponin was noted to be elevated to 0.12. He had signs and symptoms concerning for unstable angina. He was evaluated  by my partners and felt to need cardiac catheterization. Eventually he underwent cardiac catheterization by Dr. Tamala Julian with the results as follows:  Conclusion   LM lesion, 85% stenosed. Ost 1st Mrg lesion, 100% stenosed. SVG was injected . Ost RCA to Prox RCA lesion, 100% stenosed. SVG . Origin to Prox Graft lesion, 40% stenosed. Mid RCA to Dist RCA lesion, 100% stenosed. Prox LAD lesion, 100% stenosed. LIMA .   Patent saphenous vein grafts. SVG to PDA contains eccentric 40% proximal stenosis. SVG to the circumflex is widely patent. LIMA to the LAD is widely patent. Native distal left main contains 75% stenosis. The dominant obtuse marginal branch is totally occluded. The native right coronary is totally occluded. Left ventriculogram demonstrates inferior wall hypokinesis. EF 50%. Recent chest pain in the setting of PSVT with minimal enzyme elevation likely related to demand ischemia in the setting of underlying native coronary disease.   Recommendations: Medical therapy. Management of PSVT with medications versus ablation.   Since discharge she denies any further episodes of tachycardia palpitations. He is already on a high dose of beta blocker. He's had a small amount of weight gain but has stopped smoking.  He plans to start to do more exercise and work on weight loss. I'm concerned about his recurrent palpitations and the fact that asymptomatic and has demand ischemia related to them and bypass graft insufficiency.  06/10/2017  Jeremiah [name] returns today for follow-up. It's been almost a year since I last saw him. He was found to have recurrent symptomatic SVT and underwent comprehensive EP study and was diagnosed with classic AV nodal reentry  tachycardia and subsequently underwent selective radiofrequency ablation by Dr. Curt Bears. He says since that time he's had no further episodes of tachypalpitations. Since then he was hospitalized twice, after developing C. difficile colitis and  enteritis with GI bleed in January. Subsequently he developed acute congestive heart failure was found to have a mild reduction in LVEF to 45-50% by echo in March 2018. At that time he was on furosemide 40 mg daily. He had a follow-up with Almyra Deforest, PA-C in April who noted he was on Lasix 40 mg twice daily, however he reported that he was not taking that to me today. Fact he says he is now off of Lasix. He reports worsening shortness of breath and hasn't fact had a 13 pound weight gain since April. He denies any lower extremity edema. He has been working with Dr. Melvyn Novas for his shortness of breath.  07/22/2017  Jeremiah [name] is back today for follow-up. He restarted his Lasix at my direction his weight is now down from 252-244. He's had significant improvement in swelling and reports some improvement in his breathing however he says it still not good. He does have a mildly reduced EF of 45-50% but also has some chronic lung disease which is likely contributing. He says he gets short of breath while doing certain activities in the yard and then comes inside and uses his oxygen which improves him almost immediately. He denies any orthopnea. He has not had any recent or productive cough. He has a follow-up visit with his pulmonologist tomorrow. I had ordered lab work including a metabolic profile and BNP which was never obtained.  09/09/2018  Jeremiah [name] is seen today in follow-up.  He reports some improvement in his shortness of breath.  He has joined a gym and now is exercising every day.  Weight however has been stable at 252, actually up 2 pounds from April.  Despite this his breathing has improved somewhat.  He still uses oxygen.  He did have one episode recently where he became nauseated and vomited.  He was up at the [place] and had been taking antibiotics for a dental infection.  He did undergo CT scan of the head and neck and that demonstrated bilateral carotid artery calcification.  This is not  surprising given his history of coronary artery disease and prior CABG however he has not had prior carotid Dopplers.    02/18/2019  Jeremiah [name] is seen today in follow-up.  Recently he says has had some improvement in shortness of breath.  Dr. Melvyn Novas, his pulmonologist had started him on a new nebulizer called revefenacin.  His most recent hemoglobin A1c was 7.1.  Weight is down about 4 pounds.  He denies any new chest pain.  He has had some recent anemia and work-up is apparently underway for this.  He is tentatively scheduled for an endoscopy on April 16 with Dr. Therisa Doyne.  He is concerned because of his pulmonary status that he may be at high risk of decompensation with the procedure.  He also has follow-up with his PCP in the near future.  09/14/2019  Jeremiah [name] returns today for follow-up.  He continues to do well.  His shortness of breath has improved which I think was both believed due to a combination of pulmonary optimization as well as improvement in LV function.  Recently his echo in fact showed LVEF has normalized to 60 to 65%.  He remains euvolemic and is actually 2 pounds lighter than he was seen  about 8 months ago.  Hemoglobin A1c has been reasonably well-controlled however his lipids are still uncontrolled.  His total cholesterol is 211, triglycerides 494, HDL 32 and LDL of 114.  His target LDL is less than 70 and his elevated triglycerides also put him at increased cardiovascular risk.  03/02/2020  Jeremiah [name] is seen today in follow-up.  He denies any chest pain but does have some shortness of breath.  Is been some weight gain.  He reports less physical activity has been struggling with a long recovery after undergoing circumcision for issues with phimosis.  Its unfortunate that he had a difficult recovery but seems to be doing better.  Blood pressure was well controlled today.  His EKG demonstrated some worsening inferior and lateral T wave inversions.  This was previously seen however not as  significant.  His last heart cath was in 2017 which did show a patent SVG to OM and occluded SVG to right coronary.  He is anticipating restarting an exercise program and working with a Clinical research associate.  02/24/2021  Jeremiah [name] is seen today in follow-up.  Overall he says he is feeling reasonably well.  He has had some shortness of breath.  He is scheduled for a sleep study.  We discussed this because of the previously mentioned issues and that is actually scheduled tonight.  He also has a history of heart failure with diabetes which is not at target A1c of 8.3.  Lipids recently at assess her total cholesterol 106, HDL 33, LDL 49 and triglycerides 134.  07/18/2021  Jeremiah [name] is seen today for routine follow-up.  He seems to be doing well on the Jardiance at 25 mg daily.  Hemoglobin A1c is now come down to 7.3.  Blood pressure is well controlled.  EKG is unchanged showing sinus rhythm with some lateral T wave changes.  He has noted some lower blood pressures at home.  He says around the mid 90s over 71s.  Blood pressure here was elevated 130/90 however more typical readings are between 90 and 185 systolic.  He says he is asymptomatic for this.  He is on very little medicine.  1 option would be to consider backing off on the bisoprolol.  Cholesterol is very well controlled as of February total 106, HDL 33, LDL 49 triglycerides 134.  I do not believe it is "too low".  Would recommend continue his current regimen.  02/02/2022  Jeremiah [name] is seen today in follow-up.  Overall he says he is doing very well.  Weight has been fairly stable.  Blood pressure is well controlled.  He has been undergoing some rehab for a frozen shoulder.  He denies any worsening shortness of breath.  Recent labs showed total cholesterol 153, HDL 33 triglycerides 291 and LDL 73.  A1c 7.3%.  EKG shows a sinus rhythm with some inferolateral T wave changes (unchanged from prior EKG in 2022).  09/07/2022  Jeremiah [name] is seen today in follow-up.  Overall  he seems to be doing pretty well.  He is actually lost quite a bit of weight recently.  He is down to 215 pounds but was as high as 245 back in March of this past year.  He has made some dietary changes.  He is also had some issues with his GI system and is having an upcoming endoscopy.  Blood pressure is well controlled.  He denies any chest pain.  He did have a recent syncopal episode.  He said he was working on his  house and had been not sleeping well and thought he was dehydrated and simply passed out.  It does not sound like there was a clear prodrome prior to that but if he feels it was likely related to exhaustion.  He has never passed out before.  He has not had any subsequent episodes.  PMHx:  Past Medical History:  Diagnosis Date   A-fib ([place])    Anemia    Aortic valve sclerosis 12/2018   Noted on ECHO   Arthritis    CAD (coronary artery disease)    a. 2006: CABG in 2006 with LIMA-LAD, SVG-OM1, and SVG-RPDA   Cardiomegaly 12/2018   Stable, noted on CXR   Carpal tunnel syndrome    Right   Chronic pain 03/21/2016   Colon cancer ([facility]) 2006   COPD (chronic obstructive pulmonary disease) ([place])    Diabetes mellitus without complication ([place])    DVT (deep venous thrombosis) ([place])    Right   Essential hypertension 03/21/2016   GERD (gastroesophageal reflux disease)    History of blood transfusion    History of Clostridioides difficile infection    History of prostate cancer 03/21/2016   History of PSVT (paroxysmal supraventricular tachycardia)    HLD (hyperlipidemia)    HTN (hypertension)    Hx of CABG 2006   Hypercholesteremia 03/21/2016   Hypothyroidism    LAE (left atrial enlargement) 12/2018   Severe, Noted on ECHO   LVH (left ventricular hypertrophy) 12/2018   Mild, Noted on ECHO   Medication management 03/21/2016   Mitral annular calcification 12/2018   with mild MS, Noted on ECHO   Morbid obesity ([facility]) 03/21/2016   Myocardial infarct ([facility])    OSA (obstructive sleep apnea)  03/21/2016   uses oxygen at night time   Pain in right ankle and joints of right foot 03/21/2016   Paronychia 03/21/2016   Phimosis    Pneumonia    Primary insomnia 03/21/2016   Prostate cancer ([facility]) 2008   Pulmonary hypertension ([place]) 12/2018   Moderate, Noted on ECHO   Tobacco dependence 03/21/2016   Tricuspid regurgitation 12/2018   Mild, Noted on ECHO    Past Surgical History:  Procedure Laterality Date   ANKLE SURGERY Right 12/2013   CARDIAC CATHETERIZATION N/A 05/08/2016   Procedure: Left Heart Cath and Cors/Grafts Angiography;  Surgeon: Belva Crome, MD;  Location: [facility];  Service: Cardiovascular;  Laterality: N/A;   CIRCUMCISION N/A 10/07/2019   Procedure: CIRCUMCISION ADULT;  Surgeon: Ardis Hughs, MD;  Location: [facility];  Service: Urology;  Laterality: N/A;   CORONARY ARTERY BYPASS GRAFT  2006   x3   ELECTROPHYSIOLOGIC STUDY N/A 08/24/2016   Procedure: SVT Ablation;  Surgeon: Will Meredith Leeds, MD;  Location: [facility];  Service: Cardiovascular;  Laterality: N/A;   PROSTATE SURGERY  8242   PTCA     UMBILICAL HERNIA REPAIR N/A 05/26/2022   Procedure: PRIMARY REPAIR OF STRANGULATED UMBILICAL HERNIA WITH PARTIAL OMENTECTOMY;  Surgeon: Stark Klein, MD;  Location: [facility];  Service: General;  Laterality: N/A;    FAMHx:  Family History  Problem Relation Age of Onset   Dementia Mother    Diabetes Sister     SOCHx:   reports that he quit smoking about 5 years ago. His smoking use included cigarettes. He has a 30.00 pack-year smoking history. He has never used smokeless tobacco. He reports that he does not currently use alcohol. He reports that he does not use drugs.  ALLERGIES:  Allergies  Allergen Reactions   Shellfish Allergy Other (See Comments) and Nausea And Vomiting    Rash; shortness of breath   Fluzone [Influenza Virus Vaccine] Nausea And Vomiting, Palpitations and Rash    ROS: Pertinent items noted in HPI and remainder of  comprehensive ROS otherwise negative.  HOME MEDS: Current Outpatient Medications  Medication Sig Dispense Refill   albuterol (PROVENTIL) (2.5 MG/3ML) 0.083% nebulizer solution Take 3 mLs (2.5 mg total) by nebulization every 4 (four) hours as needed for wheezing or shortness of breath. 75 mL 12   arformoterol (BROVANA) 15 MCG/2ML NEBU Take 2 mLs (15 mcg total) by nebulization 2 (two) times daily. 120 mL 11   aspirin 81 MG EC tablet Take 81 mg by mouth daily.     atorvastatin (LIPITOR) 80 MG tablet Take 80 mg by mouth daily.     betamethasone dipropionate 0.05 % cream Apply 1 Application topically 2 (two) times daily as needed for dry skin.     bisoprolol (ZEBETA) 5 MG tablet TAKE 1 TABLET(5 MG) BY MOUTH DAILY (Patient taking differently: Take 5 mg by mouth daily.) 90 tablet 3   colchicine 0.6 MG tablet Take 0.6 mg by mouth daily as needed (gout flare up).     empagliflozin (JARDIANCE) 25 MG TABS tablet TAKE 1 TABLET(25 MG) BY MOUTH DAILY BEFORE BREAKFAST (Patient taking differently: Take 25 mg by mouth daily.) 90 tablet 3   ezetimibe (ZETIA) 10 MG tablet TAKE 1 TABLET(10 MG) BY MOUTH DAILY 90 tablet 2   famotidine (PEPCID) 20 MG tablet Take 1 tablet (20 mg total) by mouth 2 (two) times daily. 180 tablet 3   ferrous sulfate (FEROSUL) 325 (65 FE) MG tablet TAKE 1 TABLET BY MOUTH DAILY WITH BREAKFAST (Patient taking differently: Take 325 mg by mouth daily with breakfast.) 90 tablet 3   furosemide (LASIX) 40 MG tablet Take 1 tablet (40 mg total) by mouth daily. 135 tablet 1   glimepiride (AMARYL) 4 MG tablet Take 4 mg by mouth daily with breakfast.     HYDROcodone-acetaminophen (NORCO/VICODIN) 5-325 MG tablet Take 1-2 tablets by mouth every 6 (six) hours as needed for moderate pain. 20 tablet 0   levothyroxine (SYNTHROID, LEVOTHROID) 88 MCG tablet Take 88 mcg by mouth daily before breakfast.     losartan (COZAAR) 25 MG tablet Take 25 mg by mouth daily.     metFORMIN (GLUCOPHAGE-XR) 500 MG 24 hr  tablet Take 2,000 mg by mouth daily.     OXYGEN Inhale 3 L into the lungs at bedtime. 4 lpm with 24/7 A[facility]     pantoprazole (PROTONIX) 40 MG tablet Take 40 mg by mouth 2 (two) times daily.     revefenacin (YUPELRI) 175 MCG/3ML nebulizer solution Inhale 3 mLs (175 mcg total) into the lungs every morning. 90 mL 11   triamcinolone cream (KENALOG) 0.5 % Apply 1 Application topically 2 (two) times daily.     No current facility-administered medications for this visit.    LABS/IMAGING: No results found for this or any previous visit (from the past 48 hour(s)). No results found.  WEIGHTS: Wt Readings from Last 3 Encounters:  09/07/22 215 lb (97.5 kg)  08/21/22 228 lb 3.2 oz (103.5 kg)  05/29/22 238 lb (108 kg)    VITALS: BP (!) 110/56 (BP Location: Left Arm, Patient Position: Sitting, Cuff Size: Normal)   Pulse 72   Ht '5\' 8"'$  (1.727 m)   Wt 215 lb (97.5 kg)   BMI 32.69 kg/m  EXAM: General appearance: alert, no distress and moderately obese Neck: no carotid bruit and no JVD Lungs: diminished breath sounds bilaterally and wheezes bilaterally Heart: regular rate and rhythm, S1, S2 normal, no murmur, click, rub or gallop Abdomen: soft, non-tender; bowel sounds normal; no masses,  no organomegaly Extremities: extremities normal, atraumatic, no cyanosis or edema Pulses: 2+ and symmetric Skin: Skin color, texture, turgor normal. No rashes or lesions Neurologic: Grossly normal Psych: Pleasant  EKG: Deferred  ASSESSMENT: Stable inferior and lateral T wave inversions, dyspnea Recent SVT which was symptomatic-repeat cardiac catheterization (05/2016) shows patent grafts, status post ablation of AV nodal reentry tachycardia (08/2016) CAD status post three-vessel CABG in 2006 (LIMA to LAD, SVG to OM1, SVG to RPDA) in [place] Patent bypass grafts by cath in 2015 History of PSVT-on metoprolol Dyslipidemia on Lipitor OSA-BiPAP recommended COPD Chronic systolic congestive heart  failure-LVEF 45-50% -improved to 60 to 65% (12/2018)  Phimosis Syncope  PLAN: 1.   Jeremiah [name] denies any chest pain or worsening shortness of breath.  He did have a syncopal episode.  There is no clear prodrome to this.  It does sound like it could be a vasovagal episode or orthostasis due to dehydration and exhaustion but somewhat concerning for a coronary etiology.  He has had no episodes since.  He does have a history of PSVT in the past and could have had an arrhythmia.  I have encouraged him to continue to monitor for recurrence.  He ultimately may need a loop recorder if he has recurrent event.  Plan follow-up with me in 6 months or sooner as necessary.  Pixie Casino, MD, [facility], [place] Director of the [facility]  Card[facility] Diplomate of the American Board of Clinical Lipidology Attending Cardiologist  Direct Dial: [phone]  Fax: [phone]  Website:  [email or web address] 09/07/2022, 11:57 AM

## 2022-10-07 ENCOUNTER — Other Ambulatory Visit: Payer: Self-pay

## 2022-10-07 ENCOUNTER — Encounter (HOSPITAL_BASED_OUTPATIENT_CLINIC_OR_DEPARTMENT_OTHER): Payer: Self-pay | Admitting: *Deleted

## 2022-10-07 ENCOUNTER — Emergency Department (HOSPITAL_BASED_OUTPATIENT_CLINIC_OR_DEPARTMENT_OTHER)
Admission: EM | Admit: 2022-10-07 | Discharge: 2022-10-07 | Disposition: A | Discharge status: Home / Self Care | Payer: Federal, State, Local not specified - PPO | Attending: Emergency Medicine | Admitting: Emergency Medicine

## 2022-10-07 DIAGNOSIS — Z79899 Other long term (current) drug therapy: Secondary | ICD-10-CM | POA: Insufficient documentation

## 2022-10-07 DIAGNOSIS — D509 Iron deficiency anemia, unspecified: Secondary | ICD-10-CM | POA: Insufficient documentation

## 2022-10-07 DIAGNOSIS — T63441A Toxic effect of venom of bees, accidental (unintentional), initial encounter: Secondary | ICD-10-CM | POA: Insufficient documentation

## 2022-10-07 DIAGNOSIS — I5040 Unspecified combined systolic (congestive) and diastolic (congestive) heart failure: Secondary | ICD-10-CM | POA: Diagnosis not present

## 2022-10-07 DIAGNOSIS — E1122 Type 2 diabetes mellitus with diabetic chronic kidney disease: Secondary | ICD-10-CM | POA: Diagnosis not present

## 2022-10-07 DIAGNOSIS — Z7984 Long term (current) use of oral hypoglycemic drugs: Secondary | ICD-10-CM | POA: Insufficient documentation

## 2022-10-07 DIAGNOSIS — Z7982 Long term (current) use of aspirin: Secondary | ICD-10-CM | POA: Diagnosis not present

## 2022-10-07 DIAGNOSIS — J449 Chronic obstructive pulmonary disease, unspecified: Secondary | ICD-10-CM | POA: Insufficient documentation

## 2022-10-07 DIAGNOSIS — N183 Chronic kidney disease, stage 3 unspecified: Secondary | ICD-10-CM | POA: Insufficient documentation

## 2022-10-07 DIAGNOSIS — Z951 Presence of aortocoronary bypass graft: Secondary | ICD-10-CM | POA: Diagnosis not present

## 2022-10-07 DIAGNOSIS — I2510 Atherosclerotic heart disease of native coronary artery without angina pectoris: Secondary | ICD-10-CM | POA: Diagnosis not present

## 2022-10-07 DIAGNOSIS — I130 Hypertensive heart and chronic kidney disease with heart failure and stage 1 through stage 4 chronic kidney disease, or unspecified chronic kidney disease: Secondary | ICD-10-CM | POA: Insufficient documentation

## 2022-10-07 DIAGNOSIS — Z7951 Long term (current) use of inhaled steroids: Secondary | ICD-10-CM | POA: Insufficient documentation

## 2022-10-07 MED ORDER — PREDNISONE 50 MG PO TABS
60.0000 mg | ORAL_TABLET | Freq: Once | ORAL | Status: AC
  Administered 2022-10-07: 60 mg via ORAL
  Filled 2022-10-07: qty 1

## 2022-10-07 MED ORDER — DIPHENHYDRAMINE HCL 25 MG PO CAPS
50.0000 mg | ORAL_CAPSULE | Freq: Once | ORAL | Status: AC
Start: 2022-10-07 — End: 2022-10-07
  Administered 2022-10-07: 50 mg via ORAL
  Filled 2022-10-07: qty 2

## 2022-10-07 MED ORDER — FAMOTIDINE 20 MG PO TABS
20.0000 mg | ORAL_TABLET | Freq: Once | ORAL | Status: AC
  Administered 2022-10-07: 20 mg via ORAL
  Filled 2022-10-07: qty 1

## 2022-10-07 NOTE — Discharge Instructions (Signed)
Thank you for coming to [facility] Emergency Department. You were seen for bee stings. We did an exam, and these showed local tissue symptoms related to the bee stings.  You improved with treatment for an allergic reaction. Please follow up with your primary care provider within 1 week.   Do not hesitate to return to the ED or call 911 if you experience: -Worsening symptoms -Shortness of breath, chest pain, wheezing -Nausea/vomiting -Lightheadedness, passing out -Fevers/chills -Anything else that concerns you

## 2022-10-07 NOTE — ED Triage Notes (Signed)
Pt is here after reported bee sting.  Pt has swelling and itching to hands. Pt reports that his wife was bit by snake and he attempted to find snake in leaves and was stung x3.  No sob, no known allergy

## 2022-10-07 NOTE — ED Provider Notes (Signed)
[facility] EMERGENCY DEPT Provider Note   CSN: [id number] Arrival date & time: 10/07/22  1728     History  Chief Complaint  Patient presents with   Insect Bite    Jeremiah [name] is a 76 y.o. male with CAD status post CABG x3, morbid obesity, HTN, HLD, history SVT, history NSTEMI, history of GI bleed, COPD, diastolic and systolic CHF, A3FT, stage III CKD presents with bee stings, concern for allergic reaction.   Patient was attempting to find a snake in the grass that he and his wife thinks bit his wife when he was swarmed by bees.  He is suffering from several bee stings on his right upper extremity as well as his abdomen.  He picked out all of the stingers.  He was not originally going to check in to the Emergency Department as the patient he was just waiting with his wife but he started experiencing itching to his hands and there was concern for developing allergic reaction.  He has not had any lightheadedness, chest pain, shortness of breath, wheezing, nausea vomiting, loss of consciousness, hives.  He has only local swelling where he was stung by the bees.  He has no history of allergic reaction or anaphylaxis.  HPI     Home Medications Prior to Admission medications   Medication Sig Start Date End Date Taking? Authorizing Provider  albuterol (PROVENTIL) (2.5 MG/3ML) 0.083% nebulizer solution Take 3 mLs (2.5 mg total) by nebulization every 4 (four) hours as needed for wheezing or shortness of breath. 01/01/19   Tanda Rockers, MD  arformoterol (BROVANA) 15 MCG/2ML NEBU Take 2 mLs (15 mcg total) by nebulization 2 (two) times daily. 11/07/21   Tanda Rockers, MD  aspirin 81 MG EC tablet Take 81 mg by mouth daily. 06/23/21   [provider]  atorvastatin (LIPITOR) 80 MG tablet Take 80 mg by mouth daily.    [provider]  betamethasone dipropionate 0.05 % cream Apply 1 Application topically 2 (two) times daily as needed for dry skin. 05/18/22   [provider]  bisoprolol (ZEBETA) 5 MG tablet TAKE 1 TABLET(5 MG) BY MOUTH DAILY Patient taking differently: Take 5 mg by mouth daily. 04/20/22   Hilty, Nadean Corwin, MD  colchicine 0.6 MG tablet Take 0.6 mg by mouth daily as needed (gout flare up).    [provider]  empagliflozin (JARDIANCE) 25 MG TABS tablet TAKE 1 TABLET(25 MG) BY MOUTH DAILY BEFORE BREAKFAST Patient taking differently: Take 25 mg by mouth daily. 05/03/22   Hilty, Nadean Corwin, MD  ezetimibe (ZETIA) 10 MG tablet TAKE 1 TABLET(10 MG) BY MOUTH DAILY 07/16/22   Hilty, Nadean Corwin, MD  famotidine (PEPCID) 20 MG tablet Take 1 tablet (20 mg total) by mouth 2 (two) times daily. 07/18/22   Koirala, Dibas, MD  ferrous sulfate (FEROSUL) 325 (65 FE) MG tablet TAKE 1 TABLET BY MOUTH DAILY WITH BREAKFAST Patient taking differently: Take 325 mg by mouth daily with breakfast. 05/03/22   Hilty, Nadean Corwin, MD  furosemide (LASIX) 40 MG tablet Take 1 tablet (40 mg total) by mouth daily. 07/18/21   Hilty, Nadean Corwin, MD  glimepiride (AMARYL) 4 MG tablet Take 4 mg by mouth daily with breakfast.    [provider]  HYDROcodone-acetaminophen (NORCO/VICODIN) 5-325 MG tablet Take 1-2 tablets by mouth every 6 (six) hours as needed for moderate pain. 05/27/22   Oswald Hillock, MD  levothyroxine (SYNTHROID, LEVOTHROID) 88 MCG tablet Take 88 mcg by mouth  daily before breakfast.    [provider]  losartan (COZAAR) 25 MG tablet Take 25 mg by mouth daily.    [provider]  metFORMIN (GLUCOPHAGE-XR) 500 MG 24 hr tablet Take 2,000 mg by mouth daily.    [provider]  OXYGEN Inhale 3 L into the lungs at bedtime. 4 lpm with 24/7 [facility]    [provider]  pantoprazole (PROTONIX) 40 MG tablet Take 40 mg by mouth 2 (two) times daily.    [provider]  revefenacin (YUPELRI) 175 MCG/3ML nebulizer solution Inhale 3 mLs (175 mcg total) into the lungs every morning. 07/20/21   Tanda Rockers, MD  triamcinolone cream  (KENALOG) 0.5 % Apply 1 Application topically 2 (two) times daily. 05/09/22   [provider]      Allergies    Shellfish allergy and Fluzone [influenza virus vaccine]    Review of Systems   Review of Systems Review of systems negative for wheezing, LOC, lightheadedness.  A 10 point review of systems was performed and is negative unless otherwise reported in HPI.  Physical Exam Updated Vital Signs BP 139/77   Pulse 74   Temp 97.9 F (36.6 C) (Oral)   Resp (!) 23   SpO2 93%  Physical Exam General: Normal appearing male, lying in bed.  HEENT: Sclera anicteric, MMM, trachea midline. Cardiology: RRR, no murmurs/rubs/gallops. BL radial and DP pulses equal bilaterally.  Resp: Normal respiratory rate and effort. CTAB, no wheezes, rhonchi, crackles.  Abd: Soft, non-tender, non-distended. No rebound tenderness or guarding.  GU: Deferred. MSK: No peripheral edema or signs of trauma. Extremities without deformity or TTP. No cyanosis or clubbing. Skin: three areas of erythema, tenderness, and edema; two on RUE and one on left lower abdomen.  Please see images below.  No diffuse hives. Neuro: A&Ox4, CNs II-XII grossly intact. MAEs. Sensation grossly intact.  Psych: Normal mood and affect.     Media Information          ED Results / Procedures / Treatments    Procedures Procedures    Medications Ordered in ED Medications  diphenhydrAMINE (BENADRYL) capsule 50 mg (50 mg Oral Given 10/07/22 1815)  predniSONE (DELTASONE) tablet 60 mg (60 mg Oral Given 10/07/22 2129)  famotidine (PEPCID) tablet 20 mg (20 mg Oral Given 10/07/22 2129)    ED Course/ Medical Decision Making/ A&P                          Medical Decision Making  Patient presents with multiple bee stings.  No history of anaphylaxis.  Overall very well-appearing, in no respiratory distress.  Presentation not consistent with acute anaphylaxis (lack of pulmonary, cardiovascular or GI symptoms), angioedema.  Patient  already has plucked out all of the stingers which is confirmed on exam.  No evidence of airway compromise at this time.  Patient did have 1 blood pressure in triage that was in the 87F systolic, however by the time the patient was roomed his blood pressure was normal with no intervention.  He did not report any lightheadedness, diaphoresis, nausea vomiting at that time.  It is likely that it was a vagal reaction or an erroneous reading.  Patient is overall well and reporting only the local symptoms as well as this itching in his hands.  Plan to treat for an allergic reaction with prednisone, famotidine, benadryl for itching. No indication for epinephrine at this time. Given lack of respiratory symptoms, no indication  for EpiPen Rx.  I have personally reviewed and interpreted all labs and imaging.   Clinical Course as of 10/07/22 2332  Nancy Fetter Oct 07, 2022  2222 Patient feels well. He has had no recurrence of wheezing, SOB, hypotension. Patient did not get treated with epinephrine and his BP resolved on its own without intervention. In the absence of other symptoms likely that patient experienced a vagal reaction. He feels very well now after benadryl, prednisone, and famotidine. The itching in his palms is gone.  [HN]  2330 Patient is reevaluated and he feels back to baseline and would like to be discharged home.  He states that the swelling in the local areas of redness have gone back to to normal.  Patient is instructed to follow-up with his primary care physician within the week for reevaluation.  Do not believe patient requires an EpiPen prescription at this time given lack of respiratory symptoms.  Discharged in stable condition with discharge instructions and return precautions.  All questions answered to patient satisfaction. [HN]    Clinical Course User Index [HN] Audley Hose, MD          Final Clinical Impression(s) / ED Diagnoses Final diagnoses:  Bee sting reaction, accidental or  unintentional, initial encounter    Rx / DC Orders ED Discharge Orders     None        This note was created using dictation software, which may contain spelling or grammatical errors.    Audley Hose, MD 10/07/22 [phone]

## 2022-10-07 NOTE — ED Notes (Addendum)
Bite areas circled with a marker.

## 2022-10-08 ENCOUNTER — Other Ambulatory Visit: Payer: Self-pay

## 2022-10-08 DIAGNOSIS — Z122 Encounter for screening for malignant neoplasm of respiratory organs: Secondary | ICD-10-CM

## 2022-10-08 DIAGNOSIS — Z87891 Personal history of nicotine dependence: Secondary | ICD-10-CM

## 2022-10-09 DIAGNOSIS — R197 Diarrhea, unspecified: Secondary | ICD-10-CM | POA: Diagnosis not present

## 2022-10-09 DIAGNOSIS — R112 Nausea with vomiting, unspecified: Secondary | ICD-10-CM | POA: Diagnosis not present

## 2022-10-09 DIAGNOSIS — T63441A Toxic effect of venom of bees, accidental (unintentional), initial encounter: Secondary | ICD-10-CM | POA: Diagnosis not present

## 2022-10-15 DIAGNOSIS — Z86010 Personal history of colonic polyps: Secondary | ICD-10-CM | POA: Diagnosis not present

## 2022-10-15 DIAGNOSIS — K219 Gastro-esophageal reflux disease without esophagitis: Secondary | ICD-10-CM | POA: Diagnosis not present

## 2022-10-18 DIAGNOSIS — R109 Unspecified abdominal pain: Secondary | ICD-10-CM | POA: Diagnosis not present

## 2022-10-18 DIAGNOSIS — E1169 Type 2 diabetes mellitus with other specified complication: Secondary | ICD-10-CM | POA: Diagnosis not present

## 2022-11-14 ENCOUNTER — Ambulatory Visit (INDEPENDENT_AMBULATORY_CARE_PROVIDER_SITE_OTHER): Payer: Federal, State, Local not specified - PPO | Admitting: Acute Care

## 2022-11-14 ENCOUNTER — Encounter: Payer: Self-pay | Admitting: Acute Care

## 2022-11-14 DIAGNOSIS — Z87891 Personal history of nicotine dependence: Secondary | ICD-10-CM | POA: Diagnosis not present

## 2022-11-14 NOTE — Progress Notes (Signed)
Virtual Visit via Telephone Note  I connected with Jeremiah [name] on 11/14/22 at 10:30 AM EST by telephone and verified that I am speaking with the correct person using two identifiers.  Location: Patient: At home Provider:  [place], [place], [place], [address]    I discussed the limitations, risks, security and privacy concerns of performing an evaluation and management service by telephone and the availability of in person appointments. I also discussed with the patient that there may be a patient responsible charge related to this service. The patient expressed understanding and agreed to proceed.    Shared Decision Making Visit Lung Cancer Screening Program [phone])   Eligibility: Age 76 y.o. Pack Years Smoking History Calculation 45 pack year smoking history (# packs/per year x # years smoked) Recent History of coughing up blood  no Unexplained weight loss? no ( >Than 15 pounds within the last 6 months ) Prior History Lung / other cancer no (Diagnosis within the last 5 years already requiring surveillance chest CT Scans). Smoking Status Former Smoker Former Smokers: Years since quit: 5 years  Quit Date:  2018  Visit Components: Discussion included one or more decision making aids. yes Discussion included risk/benefits of screening. yes Discussion included potential follow up diagnostic testing for abnormal scans. yes Discussion included meaning and risk of over diagnosis. yes Discussion included meaning and risk of False Positives. yes Discussion included meaning of total radiation exposure. yes  Counseling Included: Importance of adherence to annual lung cancer LDCT screening. yes Impact of comorbidities on ability to participate in the program. yes Ability and willingness to under diagnostic treatment. yes  Smoking Cessation Counseling: Current Smokers:  Discussed importance of smoking cessation. yes Information about tobacco cessation classes and  interventions provided to patient. yes Patient provided with "ticket" for LDCT Scan. yes Symptomatic Patient. no  Counseling NA Diagnosis Code: Tobacco Use Z72.0 Asymptomatic Patient yes  Counseling (Intermediate counseling: > three minutes counseling) M0867 Former Smokers:  Discussed the importance of maintaining cigarette abstinence. yes Diagnosis Code: Personal History of Nicotine Dependence. Y19.509 Information about tobacco cessation classes and interventions provided to patient. Yes Patient provided with "ticket" for LDCT Scan. yes Written Order for Lung Cancer Screening with LDCT placed in Epic. Yes (CT Chest Lung Cancer Screening Low Dose W/O CM) [id number] Z12.2-Screening of respiratory organs Z87.891-Personal history of nicotine dependence  I spent 25 minutes of face to face time/virtual visit time  with  Jeremiah [name] discussing the risks and benefits of lung cancer screening. We took the time to pause the power point at intervals to allow for questions to be asked and answered to ensure understanding. We discussed that he had taken the single most powerful action possible to decrease his risk of developing lung cancer when he quit smoking. I counseled him to remain smoke free, and to contact me if he ever had the desire to smoke again so that I can provide resources and tools to help support the effort to remain smoke free. We discussed the time and location of the scan, and that either  Jeremiah Glassman RN, Jeremiah Prince, RN or I  or I will call / send a letter with the results within  24-72 hours of receiving them. He has the office contact information in the event he needs to speak with me,  he verbalized understanding of all of the above and had no further questions upon leaving the office.     I explained to the patient that there has  been a high incidence of coronary artery disease noted on these exams. I explained that this is a non-gated exam therefore degree or severity cannot be  determined. This patient is on statin therapy. I have asked the patient to follow-up with their PCP regarding any incidental finding of coronary artery disease and management with diet or medication as they feel is clinically indicated. The patient verbalized understanding of the above and had no further questions.    Magdalen Spatz, NP 11/14/2022

## 2022-11-14 NOTE — Patient Instructions (Signed)
Thank you for participating in the [facility]. It was our pleasure to meet you today. We will call you with the results of your scan within the next few days. Your scan will be assigned a Lung RADS category score by the physicians reading the scans.  This Lung RADS score determines follow up scanning.  See below for description of categories, and follow up screening recommendations. We will be in touch to schedule your follow up screening annually or based on recommendations of our providers. We will fax a copy of your scan results to your Primary Care Physician, or the physician who referred you to the program, to ensure they have the results. Please call the office if you have any questions or concerns regarding your scanning experience or results.  Our office number is [phone]. Please speak with Denise Phelps, RN. , or  Denise Buckner RN, They are  our Lung Cancer Screening RN.'s If They are unavailable when you call, Please leave a message on the voice mail. We will return your call at our earliest convenience.This voice mail is monitored several times a day.  Remember, if your scan is normal, we will scan you annually as long as you continue to meet the criteria for the program. (Age 55-77, Current smoker or smoker who has quit within the last 15 years). If you are a smoker, remember, quitting is the single most powerful action that you can take to decrease your risk of lung cancer and other pulmonary, breathing related problems. We know quitting is hard, and we are here to help.  Please let us know if there is anything we can do to help you meet your goal of quitting. If you are a former smoker, congratulations. We are proud of you! Remain smoke free! Remember you can refer friends or family members through the number above.  We will screen them to make sure they meet criteria for the program. Thank you for helping us take better care of you by  participating in Lung Screening.  You can receive free nicotine replacement therapy ( patches, gum or mints) by calling 1-800-QUIT NOW. Please call so we can get you on the path to becoming  a non-smoker. I know it is hard, but you can do this!  Lung RADS Categories:  Lung RADS 1: no nodules or definitely non-concerning nodules.  Recommendation is for a repeat annual scan in 12 months.  Lung RADS 2:  nodules that are non-concerning in appearance and behavior with a very low likelihood of becoming an active cancer. Recommendation is for a repeat annual scan in 12 months.  Lung RADS 3: nodules that are probably non-concerning , includes nodules with a low likelihood of becoming an active cancer.  Recommendation is for a 6-month repeat screening scan. Often noted after an upper respiratory illness. We will be in touch to make sure you have no questions, and to schedule your 6-month scan.  Lung RADS 4 A: nodules with concerning findings, recommendation is most often for a follow up scan in 3 months or additional testing based on our provider's assessment of the scan. We will be in touch to make sure you have no questions and to schedule the recommended 3 month follow up scan.  Lung RADS 4 B:  indicates findings that are concerning. We will be in touch with you to schedule additional diagnostic testing based on our provider's  assessment of the scan.  Other options for assistance in smoking cessation (   As covered by your insurance benefits)  Hypnosis for smoking cessation  [employer] [phone]  Acupuncture for smoking cessation  [facility] [phone]   

## 2022-11-15 ENCOUNTER — Inpatient Hospital Stay: Admission: RE | Admit: 2022-11-15 | Payer: Federal, State, Local not specified - PPO | Source: Ambulatory Visit

## 2022-11-21 DIAGNOSIS — K080 Exfoliation of teeth due to systemic causes: Secondary | ICD-10-CM | POA: Diagnosis not present

## 2022-11-23 ENCOUNTER — Ambulatory Visit: Payer: Federal, State, Local not specified - PPO | Admitting: Internal Medicine

## 2022-12-04 DIAGNOSIS — K633 Ulcer of intestine: Secondary | ICD-10-CM | POA: Diagnosis not present

## 2022-12-04 DIAGNOSIS — K2980 Duodenitis without bleeding: Secondary | ICD-10-CM | POA: Diagnosis not present

## 2022-12-04 DIAGNOSIS — K259 Gastric ulcer, unspecified as acute or chronic, without hemorrhage or perforation: Secondary | ICD-10-CM | POA: Diagnosis not present

## 2022-12-04 DIAGNOSIS — D125 Benign neoplasm of sigmoid colon: Secondary | ICD-10-CM | POA: Diagnosis not present

## 2022-12-04 DIAGNOSIS — Z86010 Personal history of colonic polyps: Secondary | ICD-10-CM | POA: Diagnosis not present

## 2022-12-04 DIAGNOSIS — K2270 Barrett's esophagus without dysplasia: Secondary | ICD-10-CM | POA: Diagnosis not present

## 2022-12-04 DIAGNOSIS — K529 Noninfective gastroenteritis and colitis, unspecified: Secondary | ICD-10-CM | POA: Diagnosis not present

## 2022-12-04 DIAGNOSIS — K6389 Other specified diseases of intestine: Secondary | ICD-10-CM | POA: Diagnosis not present

## 2022-12-04 DIAGNOSIS — K2930 Chronic superficial gastritis without bleeding: Secondary | ICD-10-CM | POA: Diagnosis not present

## 2022-12-04 DIAGNOSIS — R112 Nausea with vomiting, unspecified: Secondary | ICD-10-CM | POA: Diagnosis not present

## 2022-12-04 DIAGNOSIS — K319 Disease of stomach and duodenum, unspecified: Secondary | ICD-10-CM | POA: Diagnosis not present

## 2022-12-04 DIAGNOSIS — K5730 Diverticulosis of large intestine without perforation or abscess without bleeding: Secondary | ICD-10-CM | POA: Diagnosis not present

## 2022-12-04 DIAGNOSIS — K648 Other hemorrhoids: Secondary | ICD-10-CM | POA: Diagnosis not present

## 2022-12-11 DIAGNOSIS — K080 Exfoliation of teeth due to systemic causes: Secondary | ICD-10-CM | POA: Diagnosis not present

## 2022-12-18 ENCOUNTER — Ambulatory Visit
Admission: RE | Admit: 2022-12-18 | Discharge: 2022-12-18 | Disposition: A | Discharge status: Home / Self Care | Payer: Federal, State, Local not specified - PPO | Source: Ambulatory Visit | Attending: Acute Care | Admitting: Acute Care

## 2022-12-18 DIAGNOSIS — Z87891 Personal history of nicotine dependence: Secondary | ICD-10-CM

## 2022-12-18 DIAGNOSIS — Z122 Encounter for screening for malignant neoplasm of respiratory organs: Secondary | ICD-10-CM

## 2022-12-19 ENCOUNTER — Other Ambulatory Visit: Payer: Self-pay

## 2022-12-19 ENCOUNTER — Other Ambulatory Visit: Payer: Self-pay | Admitting: Gastroenterology

## 2022-12-19 DIAGNOSIS — Z87891 Personal history of nicotine dependence: Secondary | ICD-10-CM

## 2022-12-19 DIAGNOSIS — K559 Vascular disorder of intestine, unspecified: Secondary | ICD-10-CM

## 2022-12-19 DIAGNOSIS — Z122 Encounter for screening for malignant neoplasm of respiratory organs: Secondary | ICD-10-CM

## 2022-12-20 ENCOUNTER — Other Ambulatory Visit (HOSPITAL_COMMUNITY): Payer: Self-pay | Admitting: Gastroenterology

## 2022-12-20 DIAGNOSIS — R1110 Vomiting, unspecified: Secondary | ICD-10-CM

## 2022-12-20 DIAGNOSIS — K219 Gastro-esophageal reflux disease without esophagitis: Secondary | ICD-10-CM

## 2022-12-20 DIAGNOSIS — K22719 Barrett's esophagus with dysplasia, unspecified: Secondary | ICD-10-CM

## 2022-12-21 DIAGNOSIS — K219 Gastro-esophageal reflux disease without esophagitis: Secondary | ICD-10-CM | POA: Diagnosis not present

## 2022-12-24 ENCOUNTER — Telehealth: Payer: Self-pay | Admitting: Internal Medicine

## 2022-12-24 NOTE — Telephone Encounter (Signed)
New Message:    Patient said he had a CT and his pulmonary doctor wanted him to see Dr Debara Pickett. Dr Debara Pickett does not have anything soon.

## 2022-12-24 NOTE — Telephone Encounter (Signed)
Routed to MD to review CT to see if there is an urgent need for an appointment. Will schedule patient accordingly. Due for routine 6 month visit ~ April 2024.

## 2022-12-24 NOTE — Telephone Encounter (Signed)
Per secure chat received from operator:   Good Morning Jeremiah [name], I know you are busy, when you have time, read this please. This pt said he had a CT and his pulmonary wanted him to see Dr Debara Pickett. Dr Debara Pickett does not have anything soon. He said if he can not see Dr Debara Pickett, he will find another doctor. He absolutely will not see a PA or NP

## 2022-12-24 NOTE — Telephone Encounter (Signed)
Spoke with patient. He reports Dr. Melvyn Novas told him to see Dr. Debara Pickett for findings on the CT test. Advised Dr. Debara Pickett reviewed this. He has no new cardiac symptoms. At time of call, there are no soon openings with Dr. Debara Pickett. Patient refuses to see PA or NP. Advised will call him for a cancellation appointment.

## 2022-12-25 NOTE — Telephone Encounter (Signed)
Pt calling back to confirm appt on 12/27/21. Please advise

## 2022-12-25 NOTE — Telephone Encounter (Signed)
Patient confirmed he will be at 1/25 appointment with Dr. Debara Pickett.

## 2022-12-25 NOTE — Telephone Encounter (Signed)
LM for patient that there is an opening on 12/27/22 @ 3:15pm at [place] office. Held this slot for patient. Asked him to call back to confirm/schedule or decline.

## 2022-12-27 ENCOUNTER — Ambulatory Visit (HOSPITAL_COMMUNITY): Payer: Federal, State, Local not specified - PPO

## 2022-12-27 ENCOUNTER — Ambulatory Visit (HOSPITAL_COMMUNITY): Payer: Federal, State, Local not specified - PPO | Attending: Internal Medicine | Admitting: Internal Medicine

## 2022-12-27 ENCOUNTER — Encounter: Payer: Self-pay | Admitting: Internal Medicine

## 2022-12-27 VITALS — BP 82/46 | HR 63 | Ht 68.0 in | Wt 208.0 lb

## 2022-12-27 DIAGNOSIS — I272 Pulmonary hypertension, unspecified: Secondary | ICD-10-CM | POA: Diagnosis not present

## 2022-12-27 DIAGNOSIS — I350 Nonrheumatic aortic (valve) stenosis: Secondary | ICD-10-CM

## 2022-12-27 DIAGNOSIS — Z951 Presence of aortocoronary bypass graft: Secondary | ICD-10-CM

## 2022-12-27 DIAGNOSIS — I10 Essential (primary) hypertension: Secondary | ICD-10-CM

## 2022-12-27 NOTE — Patient Instructions (Signed)
Medication Instructions:  Your physician has recommended you make the following change in your medication:   -Stop taking losartan (cozaar).   *If you need a refill on your cardiac medications before your next appointment, please call your pharmacy*   Testing/Procedures: Your physician has requested that you have an echocardiogram. Echocardiography is a painless test that uses sound waves to create images of your heart. It provides your doctor with information about the size and shape of your heart and how well your heart's chambers and valves are working. This procedure takes approximately one hour. There are no restrictions for this procedure. Please do NOT wear cologne, perfume, aftershave, or lotions (deodorant is allowed). Please arrive 15 minutes prior to your appointment time. This procedure will be done at [address]    Follow-Up: At [facility], you and your health needs are our priority.  As part of our continuing mission to provide you with exceptional heart care, we have created designated Provider Care Teams.  These Care Teams include your primary Cardiologist (physician) and Advanced Practice Providers (APPs -  Physician Assistants and Nurse Practitioners) who all work together to provide you with the care you need, when you need it.  We recommend signing up for the patient portal called "MyChart".  Sign up information is provided on this After Visit Summary.  MyChart is used to connect with patients for Virtual Visits (Telemedicine).  Patients are able to view lab/test results, encounter notes, upcoming appointments, etc.  Non-urgent messages can be sent to your provider as well.   To learn more about what you can do with MyChart, go to [email or web address].    Your next appointment:   3 month(s)  Provider:   Pixie Casino, MD     Other Instructions Continue to monitor your blood pressure. Please call us in 1 week with blood pressure log for  Dr. Debara Pickett to review.   HOW TO TAKE YOUR BLOOD PRESSURE: Rest 5 minutes before taking your blood pressure. Don't smoke or drink caffeinated beverages for at least 30 minutes before. Take your blood pressure before (not after) you eat. Sit comfortably with your back supported and both feet on the floor (don't cross your legs). Elevate your arm to heart level on a table or a desk. Use the proper sized cuff. It should fit smoothly and snugly around your bare upper arm. There should be enough room to slip a fingertip under the cuff. The bottom edge of the cuff should be 1 inch above the crease of the elbow. Ideally, take 3 measurements at one sitting and record the average.

## 2022-12-27 NOTE — Progress Notes (Signed)
OFFICE NOTE  Chief Complaint:  Routine follow-up  Primary Care Physician: [name], MD  HPI:  Jeremiah [name] is a 77 y.o. 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 He is originally from [place] and his wife who is accompanying him today is originally from [place]. Mr. Klemens had an MI in 2006 and underwent coronary artery bypass grafting with a LIMA to LAD, SVG to OM1, and SVG to RPDA. He did fairly well with this however 2015 was having chest discomfort. He underwent a nuclear stress test which showed possible reversible ischemia. Therefore he was referred for cardiac catheterization at [facility] [place]. That demonstrated all 3 patent bypass grafts with multivessel coronary disease. At that time he was also having episodes of palpitations and a monitor demonstrated PSVT. He was scheduled for possible ablation but is had chronic problems with an ankle fracture and nonhealing wound. He therefore never underwent ablation. He was placed on digoxin and has been on metoprolol but stopped the digoxin at some point in the past. He reports over the past 8-12 months she's had no further palpitations. He also has a history of dyslipidemia, hypothyroidism, and borderline diabetes. Recently he had a sleep study through [facility] which demonstrated severe obstructive sleep apnea and BiPAP was recommended with settings of 19/14 cm water pressure. Currently he denies any chest pain or worsening shortness of breath. His last echocardiogram from his cardiologist in [place] was a 2015 which showed an EF of 58% and no significant valvular disease.  07/26/2016  Jeremiah [name] returns today for follow-up. He was recently seen in the emergency department in the beginning of June for chest pain. This was associated with tachycardia and probable SVT. Troponin was noted to be elevated to 0.12. He had signs and symptoms concerning for unstable angina. He was evaluated by  my partners and felt to need cardiac catheterization. Eventually he underwent cardiac catheterization by Dr. Tamala Julian with the results as follows:  Conclusion   LM lesion, 85% stenosed. Ost 1st Mrg lesion, 100% stenosed. SVG was injected . Ost RCA to Prox RCA lesion, 100% stenosed. SVG . Origin to Prox Graft lesion, 40% stenosed. Mid RCA to Dist RCA lesion, 100% stenosed. Prox LAD lesion, 100% stenosed. LIMA .   Patent saphenous vein grafts. SVG to PDA contains eccentric 40% proximal stenosis. SVG to the circumflex is widely patent. LIMA to the LAD is widely patent. Native distal left main contains 75% stenosis. The dominant obtuse marginal branch is totally occluded. The native right coronary is totally occluded. Left ventriculogram demonstrates inferior wall hypokinesis. EF 50%. Recent chest pain in the setting of PSVT with minimal enzyme elevation likely related to demand ischemia in the setting of underlying native coronary disease.   Recommendations: Medical therapy. Management of PSVT with medications versus ablation.   Since discharge she denies any further episodes of tachycardia palpitations. He is already on a high dose of beta blocker. He's had a small amount of weight gain but has stopped smoking.  He plans to start to do more exercise and work on weight loss. I'm concerned about his recurrent palpitations and the fact that asymptomatic and has demand ischemia related to them and bypass graft insufficiency.  06/10/2017  Jeremiah [name] returns today for follow-up. It's been almost a year since I last saw him. He was found to have recurrent symptomatic SVT and underwent comprehensive EP study and was diagnosed with classic AV nodal reentry tachycardia  and subsequently underwent selective radiofrequency ablation by Dr. Curt Bears. He says since that time he's had no further episodes of tachypalpitations. Since then he was hospitalized twice, after developing C. difficile colitis and  enteritis with GI bleed in January. Subsequently he developed acute congestive heart failure was found to have a mild reduction in LVEF to 45-50% by echo in March 2018. At that time he was on furosemide 40 mg daily. He had a follow-up with Almyra Deforest, PA-C in April who noted he was on Lasix 40 mg twice daily, however he reported that he was not taking that to me today. Fact he says he is now off of Lasix. He reports worsening shortness of breath and hasn't fact had a 13 pound weight gain since April. He denies any lower extremity edema. He has been working with Dr. Melvyn Novas for his shortness of breath.  07/22/2017  Jeremiah [name] is back today for follow-up. He restarted his Lasix at my direction his weight is now down from 252-244. He's had significant improvement in swelling and reports some improvement in his breathing however he says it still not good. He does have a mildly reduced EF of 45-50% but also has some chronic lung disease which is likely contributing. He says he gets short of breath while doing certain activities in the yard and then comes inside and uses his oxygen which improves him almost immediately. He denies any orthopnea. He has not had any recent or productive cough. He has a follow-up visit with his pulmonologist tomorrow. I had ordered lab work including a metabolic profile and BNP which was never obtained.  09/09/2018  Jeremiah [name] is seen today in follow-up.  He reports some improvement in his shortness of breath.  He has joined a gym and now is exercising every day.  Weight however has been stable at 252, actually up 2 pounds from April.  Despite this his breathing has improved somewhat.  He still uses oxygen.  He did have one episode recently where he became nauseated and vomited.  He was up at the [facility] and had been taking antibiotics for a dental infection.  He did undergo CT scan of the head and neck and that demonstrated bilateral carotid artery calcification.  This is not  surprising given his history of coronary artery disease and prior CABG however he has not had prior carotid Dopplers.    02/18/2019  Jeremiah [name] is seen today in follow-up.  Recently he says has had some improvement in shortness of breath.  Dr. Melvyn Novas, his pulmonologist had started him on a new nebulizer called revefenacin.  His most recent hemoglobin A1c was 7.1.  Weight is down about 4 pounds.  He denies any new chest pain.  He has had some recent anemia and work-up is apparently underway for this.  He is tentatively scheduled for an endoscopy on April 16 with Dr. Therisa Doyne.  He is concerned because of his pulmonary status that he may be at high risk of decompensation with the procedure.  He also has follow-up with his PCP in the near future.  09/14/2019  Jeremiah [name] returns today for follow-up.  He continues to do well.  His shortness of breath has improved which I think was both believed due to a combination of pulmonary optimization as well as improvement in LV function.  Recently his echo in fact showed LVEF has normalized to 60 to 65%.  He remains euvolemic and is actually 2 pounds lighter than he was seen about  8 months ago.  Hemoglobin A1c has been reasonably well-controlled however his lipids are still uncontrolled.  His total cholesterol is 211, triglycerides 494, HDL 32 and LDL of 114.  His target LDL is less than 70 and his elevated triglycerides also put him at increased cardiovascular risk.  03/02/2020  Jeremiah [name] is seen today in follow-up.  He denies any chest pain but does have some shortness of breath.  Is been some weight gain.  He reports less physical activity has been struggling with a long recovery after undergoing circumcision for issues with phimosis.  Its unfortunate that he had a difficult recovery but seems to be doing better.  Blood pressure was well controlled today.  His EKG demonstrated some worsening inferior and lateral T wave inversions.  This was previously seen however not as  significant.  His last heart cath was in 2017 which did show a patent SVG to OM and occluded SVG to right coronary.  He is anticipating restarting an exercise program and working with a Clinical research associate.  02/24/2021  Jeremiah [name] is seen today in follow-up.  Overall he says he is feeling reasonably well.  He has had some shortness of breath.  He is scheduled for a sleep study.  We discussed this because of the previously mentioned issues and that is actually scheduled tonight.  He also has a history of heart failure with diabetes which is not at target A1c of 8.3.  Lipids recently at assess her total cholesterol 106, HDL 33, LDL 49 and triglycerides 134.  07/18/2021  Jeremiah [name] is seen today for routine follow-up.  He seems to be doing well on the Jardiance at 25 mg daily.  Hemoglobin A1c is now come down to 7.3.  Blood pressure is well controlled.  EKG is unchanged showing sinus rhythm with some lateral T wave changes.  He has noted some lower blood pressures at home.  He says around the mid 90s over 41s.  Blood pressure here was elevated 130/90 however more typical readings are between 90 and 631 systolic.  He says he is asymptomatic for this.  He is on very little medicine.  1 option would be to consider backing off on the bisoprolol.  Cholesterol is very well controlled as of February total 106, HDL 33, LDL 49 triglycerides 134.  I do not believe it is "too low".  Would recommend continue his current regimen.  02/02/2022  Jeremiah [name] is seen today in follow-up.  Overall he says he is doing very well.  Weight has been fairly stable.  Blood pressure is well controlled.  He has been undergoing some rehab for a frozen shoulder.  He denies any worsening shortness of breath.  Recent labs showed total cholesterol 153, HDL 33 triglycerides 291 and LDL 73.  A1c 7.3%.  EKG shows a sinus rhythm with some inferolateral T wave changes (unchanged from prior EKG in 2022).  09/07/2022  Jeremiah [name] is seen today in follow-up.  Overall  he seems to be doing pretty well.  He is actually lost quite a bit of weight recently.  He is down to 215 pounds but was as high as 245 back in March of this past year.  He has made some dietary changes.  He is also had some issues with his GI system and is having an upcoming endoscopy.  Blood pressure is well controlled.  He denies any chest pain.  He did have a recent syncopal episode.  He said he was working on his house  and had been not sleeping well and thought he was dehydrated and simply passed out.  It does not sound like there was a clear prodrome prior to that but if he feels it was likely related to exhaustion.  He has never passed out before.  He has not had any subsequent episodes.  12/27/2022  Jeremiah [name] returns today for follow-up.  Recently he is followed up with Dr. Melvyn Novas.  Has been having issues with reflux and had EGD and colonoscopy.  He was noted to have I believe 24 lesions of the esophagus, likely Barrett's esophagus.  He has had vomiting and is scheduled for a gastric emptying study.  Despite this, he is also made significant dietary changes and reports in the past 3 months he is lost about 35 pounds.  It was noted that he is markedly hypotensive today with blood pressure 82/46.  He was also seen recently as mentioned with Dr. Melvyn Novas and had a CT scan of the chest.  Besides emphysema this demonstrated aortic valve calcification as well as pulmonary artery dilatation suggestive of pulmonary hypertension.  He has been short of breath but uses home oxygen.  He also tells me that he has an upcoming visit for periodontal surgery with Dr. Buelah Manis.  PMHx:  Past Medical History:  Diagnosis Date   A-fib ([place])    Anemia    Aortic valve sclerosis 12/2018   Noted on ECHO   Arthritis    CAD (coronary artery disease)    a. 2006: CABG in 2006 with LIMA-LAD, SVG-OM1, and SVG-RPDA   Cardiomegaly 12/2018   Stable, noted on CXR   Carpal tunnel syndrome    Right   Chronic pain 03/21/2016   Colon cancer  ([facility]) 2006   COPD (chronic obstructive pulmonary disease) ([facility])    Diabetes mellitus without complication ([facility])    DVT (deep venous thrombosis) ([facility])    Right   Essential hypertension 03/21/2016   GERD (gastroesophageal reflux disease)    History of blood transfusion    History of Clostridioides difficile infection    History of prostate cancer 03/21/2016   History of PSVT (paroxysmal supraventricular tachycardia)    HLD (hyperlipidemia)    HTN (hypertension)    Hx of CABG 2006   Hypercholesteremia 03/21/2016   Hypothyroidism    LAE (left atrial enlargement) 12/2018   Severe, Noted on ECHO   LVH (left ventricular hypertrophy) 12/2018   Mild, Noted on ECHO   Medication management 03/21/2016   Mitral annular calcification 12/2018   with mild MS, Noted on ECHO   Morbid obesity ([facility]) 03/21/2016   Myocardial infarct ([facility])    OSA (obstructive sleep apnea) 03/21/2016   uses oxygen at night time   Pain in right ankle and joints of right foot 03/21/2016   Paronychia 03/21/2016   Phimosis    Pneumonia    Primary insomnia 03/21/2016   Prostate cancer ([facility]) 2008   Pulmonary hypertension ([facility]) 12/2018   Moderate, Noted on ECHO   Tobacco dependence 03/21/2016   Tricuspid regurgitation 12/2018   Mild, Noted on ECHO    Past Surgical History:  Procedure Laterality Date   ANKLE SURGERY Right 12/2013   CARDIAC CATHETERIZATION N/A 05/08/2016   Procedure: Left Heart Cath and Cors/Grafts Angiography;  Surgeon: Belva Crome, MD;  Location: [facility];  Service: Cardiovascular;  Laterality: N/A;   CIRCUMCISION N/A 10/07/2019   Procedure: CIRCUMCISION ADULT;  Surgeon: Ardis Hughs, MD;  Location: [facility];  Service: Urology;  Laterality:  N/A;   CORONARY ARTERY BYPASS GRAFT  2006   x3   ELECTROPHYSIOLOGIC STUDY N/A 08/24/2016   Procedure: SVT Ablation;  Surgeon: Will Meredith Leeds, MD;  Location: [facility];  Service: Cardiovascular;  Laterality: N/A;   PROSTATE SURGERY  2008    PTCA     UMBILICAL HERNIA REPAIR N/A 05/26/2022   Procedure: PRIMARY REPAIR OF STRANGULATED UMBILICAL HERNIA WITH PARTIAL OMENTECTOMY;  Surgeon: Stark Klein, MD;  Location: [facility];  Service: General;  Laterality: N/A;    FAMHx:  Family History  Problem Relation Age of Onset   Dementia Mother    Diabetes Sister     SOCHx:   reports that he quit smoking about 6 years ago. His smoking use included cigarettes. He has a 45.00 pack-year smoking history. He has never used smokeless tobacco. He reports that he does not currently use alcohol. He reports that he does not use drugs.  ALLERGIES:  Allergies  Allergen Reactions   Shellfish Allergy Other (See Comments) and Nausea And Vomiting    Rash; shortness of breath   Fluzone [Influenza Virus Vaccine] Nausea And Vomiting, Palpitations and Rash    ROS: Pertinent items noted in HPI and remainder of comprehensive ROS otherwise negative.  HOME MEDS: Current Outpatient Medications  Medication Sig Dispense Refill   albuterol (PROVENTIL) (2.5 MG/3ML) 0.083% nebulizer solution Take 3 mLs (2.5 mg total) by nebulization every 4 (four) hours as needed for wheezing or shortness of breath. 75 mL 12   arformoterol (BROVANA) 15 MCG/2ML NEBU Take 2 mLs (15 mcg total) by nebulization 2 (two) times daily. 120 mL 11   aspirin 81 MG EC tablet Take 81 mg by mouth daily.     atorvastatin (LIPITOR) 80 MG tablet Take 80 mg by mouth daily.     betamethasone dipropionate 0.05 % cream Apply 1 Application topically 2 (two) times daily as needed for dry skin.     bisoprolol (ZEBETA) 5 MG tablet TAKE 1 TABLET(5 MG) BY MOUTH DAILY (Patient taking differently: Take 5 mg by mouth daily.) 90 tablet 3   empagliflozin (JARDIANCE) 25 MG TABS tablet TAKE 1 TABLET(25 MG) BY MOUTH DAILY BEFORE BREAKFAST (Patient taking differently: Take 25 mg by mouth daily.) 90 tablet 3   esomeprazole (NEXIUM) 40 MG capsule Take 40 mg by mouth 2 (two) times daily before a meal.     ezetimibe  (ZETIA) 10 MG tablet TAKE 1 TABLET(10 MG) BY MOUTH DAILY 90 tablet 2   ferrous sulfate (FEROSUL) 325 (65 FE) MG tablet TAKE 1 TABLET BY MOUTH DAILY WITH BREAKFAST (Patient taking differently: Take 325 mg by mouth daily with breakfast.) 90 tablet 3   formoterol (PERFOROMIST) 20 MCG/2ML nebulizer solution Take 20 mcg by nebulization 2 (two) times daily.     furosemide (LASIX) 40 MG tablet Take 1 tablet (40 mg total) by mouth daily. 135 tablet 1   glimepiride (AMARYL) 4 MG tablet Take 4 mg by mouth daily with breakfast.     HYDROcodone-acetaminophen (NORCO/VICODIN) 5-325 MG tablet Take 1-2 tablets by mouth every 6 (six) hours as needed for moderate pain. 20 tablet 0   levothyroxine (SYNTHROID, LEVOTHROID) 88 MCG tablet Take 88 mcg by mouth daily before breakfast.     metFORMIN (GLUCOPHAGE-XR) 500 MG 24 hr tablet Take 2,000 mg by mouth daily.     OXYGEN Inhale 3 L into the lungs at bedtime. 4 lpm with 24/7 AHC     revefenacin (YUPELRI) 175 MCG/3ML nebulizer solution Inhale 3 mLs (175  mcg total) into the lungs every morning. 90 mL 11   triamcinolone cream (KENALOG) 0.5 % Apply 1 Application topically 2 (two) times daily.     No current facility-administered medications for this visit.    LABS/IMAGING: No results found for this or any previous visit (from the past 48 hour(s)). No results found.  WEIGHTS: Wt Readings from Last 3 Encounters:  12/27/22 208 lb (94.3 kg)  09/07/22 215 lb (97.5 kg)  08/21/22 228 lb 3.2 oz (103.5 kg)    VITALS: BP (!) 82/46   Pulse 63   Ht '5\' 8"'$  (1.727 m)   Wt 208 lb (94.3 kg)   SpO2 98%   BMI 31.63 kg/m   EXAM: General appearance: alert and no distress Neck: no carotid bruit, no JVD, and thyroid not enlarged, symmetric, no tenderness/mass/nodules Lungs: clear to auscultation bilaterally Heart: regular rate and rhythm, S1, S2 normal, and systolic murmur: systolic ejection 2/6, crescendo at 2nd right intercostal space Abdomen: soft, non-tender; bowel  sounds normal; no masses,  no organomegaly Extremities: extremities normal, atraumatic, no cyanosis or edema Pulses: 2+ and symmetric Skin: Skin color, texture, turgor normal. No rashes or lesions Neurologic: Grossly normal Psych: Pleasant  EKG: Normal sinus rhythm with sinus arrhythmia at 63, inferior and lateral T wave inversions-personally reviewed  ASSESSMENT: Stable inferior and lateral T wave inversions, dyspnea Recent SVT which was symptomatic-repeat cardiac catheterization (05/2016) shows patent grafts, status post ablation of AV nodal reentry tachycardia (08/2016) CAD status post three-vessel CABG in 2006 (LIMA to LAD, SVG to OM1, SVG to RPDA) in [place] Patent bypass grafts by cath in 2015 History of PSVT-on metoprolol Dyslipidemia on Lipitor OSA-BiPAP recommended COPD Chronic systolic congestive heart failure-LVEF 45-50% -improved to 60 to 65% (12/2018)  Phimosis Syncope  PLAN: 1.   Jeremiah [name] has had shortness of breath but seems to be fairly stable.  Moreover he has had significant weight loss about 35 pounds in the past 3 months.  Today's weight was 208 pounds.  Blood pressure was significantly low at 82/46.  I advised him to stop his losartan.  He needs to monitor blood pressure and may be able to stop his bisoprolol or cut the dose back.  From a cardiac standpoint he should be okay to proceed with periodontal surgery with Dr. Buelah Manis.  If necessary can hold aspirin up to 7 to 10 days prior to the procedure and restart after.  As he has some pulmonary arterial hypertension (likely secondary to his sleep apnea/COPD), we will proceed with a repeat echo.  His last echo in 2020 showed improvement in systolic heart failure to 60 to 65% however he did have some aortic valve calcification.  There is probably some aortic valve stenosis based on a systolic murmur on exam.  I will contact him with those results.  He should reach out with his blood pressure readings over the next week.   Plan follow-up with me in about 3 months or sooner as necessary.  Pixie Casino, MD, [facility], [place] Director of the [facility] Lipid [facility] &  Cardiovascular [facility] Clinic Diplomate of the American Board of Clinical Lipidology Attending Cardiologist  Direct Dial: [phone]  Fax: [phone]  Website:  [email or web address] 12/27/2022, 4:09 PM

## 2022-12-28 DIAGNOSIS — E1165 Type 2 diabetes mellitus with hyperglycemia: Secondary | ICD-10-CM | POA: Diagnosis not present

## 2022-12-28 DIAGNOSIS — K219 Gastro-esophageal reflux disease without esophagitis: Secondary | ICD-10-CM | POA: Diagnosis not present

## 2023-01-01 ENCOUNTER — Other Ambulatory Visit (HOSPITAL_COMMUNITY): Payer: Federal, State, Local not specified - PPO

## 2023-01-02 ENCOUNTER — Ambulatory Visit (HOSPITAL_COMMUNITY)
Admission: RE | Admit: 2023-01-02 | Discharge: 2023-01-02 | Disposition: A | Discharge status: Home / Self Care | Payer: Federal, State, Local not specified - PPO | Source: Ambulatory Visit | Attending: Gastroenterology | Admitting: Gastroenterology

## 2023-01-02 DIAGNOSIS — R1110 Vomiting, unspecified: Secondary | ICD-10-CM | POA: Diagnosis not present

## 2023-01-02 DIAGNOSIS — K22719 Barrett's esophagus with dysplasia, unspecified: Secondary | ICD-10-CM | POA: Diagnosis not present

## 2023-01-02 DIAGNOSIS — R109 Unspecified abdominal pain: Secondary | ICD-10-CM | POA: Diagnosis not present

## 2023-01-02 DIAGNOSIS — K219 Gastro-esophageal reflux disease without esophagitis: Secondary | ICD-10-CM | POA: Insufficient documentation

## 2023-01-02 MED ORDER — TECHNETIUM TC 99M SULFUR COLLOID
2.0000 | Freq: Once | INTRAVENOUS | Status: AC | PRN
  Administered 2023-01-02: 2 via ORAL

## 2023-01-16 ENCOUNTER — Ambulatory Visit
Admission: RE | Admit: 2023-01-16 | Discharge: 2023-01-16 | Disposition: A | Discharge status: Home / Self Care | Payer: Federal, State, Local not specified - PPO | Source: Ambulatory Visit | Attending: Gastroenterology | Admitting: Gastroenterology

## 2023-01-16 DIAGNOSIS — K5730 Diverticulosis of large intestine without perforation or abscess without bleeding: Secondary | ICD-10-CM | POA: Diagnosis not present

## 2023-01-16 DIAGNOSIS — K559 Vascular disorder of intestine, unspecified: Secondary | ICD-10-CM

## 2023-01-16 DIAGNOSIS — R12 Heartburn: Secondary | ICD-10-CM | POA: Diagnosis not present

## 2023-01-16 DIAGNOSIS — R1110 Vomiting, unspecified: Secondary | ICD-10-CM | POA: Diagnosis not present

## 2023-01-16 DIAGNOSIS — I700 Atherosclerosis of aorta: Secondary | ICD-10-CM | POA: Diagnosis not present

## 2023-01-16 MED ORDER — IOPAMIDOL (ISOVUE-300) INJECTION 61%
100.0000 mL | Freq: Once | INTRAVENOUS | Status: AC | PRN
  Administered 2023-01-16: 100 mL via INTRAVENOUS

## 2023-01-23 ENCOUNTER — Ambulatory Visit (HOSPITAL_COMMUNITY): Payer: Federal, State, Local not specified - PPO | Attending: Internal Medicine

## 2023-01-23 DIAGNOSIS — I272 Pulmonary hypertension, unspecified: Secondary | ICD-10-CM

## 2023-01-23 DIAGNOSIS — I350 Nonrheumatic aortic (valve) stenosis: Secondary | ICD-10-CM | POA: Diagnosis not present

## 2023-01-23 LAB — ECHOCARDIOGRAM COMPLETE
AR max vel: 2 cm2
AV Area VTI: 2.09 cm2
AV Area mean vel: 2.16 cm2
AV Mean grad: 8 mmHg
AV Peak grad: 18.8 mmHg
Ao pk vel: 2.17 m/s
Area-P 1/2: 1.42 cm2
S' Lateral: 3 cm

## 2023-01-24 ENCOUNTER — Telehealth: Payer: Self-pay | Admitting: Internal Medicine

## 2023-01-24 ENCOUNTER — Telehealth: Payer: Self-pay

## 2023-01-24 MED ORDER — FUROSEMIDE 40 MG PO TABS: 40.0000 mg | ORAL_TABLET | Freq: Every day | ORAL | 3 refills | Status: DC

## 2023-01-24 NOTE — Telephone Encounter (Signed)
Patient returning call for echo results. 

## 2023-01-24 NOTE — Telephone Encounter (Signed)
See other note

## 2023-01-24 NOTE — Telephone Encounter (Signed)
Gave pt echo result per Dr. Debara Pickett. Explained to patient if his SOB gets worse, happens at rest, or he has any lightheadedness/dizziness, or swelling, to contact our clinic. Furosemide 39m daily refilled. Assisted pt with getting onto MyChart.

## 2023-01-25 DIAGNOSIS — E1169 Type 2 diabetes mellitus with other specified complication: Secondary | ICD-10-CM | POA: Diagnosis not present

## 2023-01-25 DIAGNOSIS — Z0000 Encounter for general adult medical examination without abnormal findings: Secondary | ICD-10-CM | POA: Diagnosis not present

## 2023-01-25 DIAGNOSIS — E7800 Pure hypercholesterolemia, unspecified: Secondary | ICD-10-CM | POA: Diagnosis not present

## 2023-01-25 DIAGNOSIS — J449 Chronic obstructive pulmonary disease, unspecified: Secondary | ICD-10-CM | POA: Diagnosis not present

## 2023-01-25 DIAGNOSIS — I10 Essential (primary) hypertension: Secondary | ICD-10-CM | POA: Diagnosis not present

## 2023-01-25 DIAGNOSIS — D649 Anemia, unspecified: Secondary | ICD-10-CM | POA: Diagnosis not present

## 2023-02-05 ENCOUNTER — Encounter (HOSPITAL_COMMUNITY): Payer: Self-pay | Admitting: Emergency Medicine

## 2023-02-05 ENCOUNTER — Emergency Department (HOSPITAL_COMMUNITY)
Admission: EM | Admit: 2023-02-05 | Discharge: 2023-02-06 | Disposition: A | Discharge status: Home / Self Care | Payer: Federal, State, Local not specified - PPO | Attending: Emergency Medicine | Admitting: Emergency Medicine

## 2023-02-05 ENCOUNTER — Emergency Department (HOSPITAL_COMMUNITY): Payer: Federal, State, Local not specified - PPO

## 2023-02-05 ENCOUNTER — Other Ambulatory Visit: Payer: Self-pay

## 2023-02-05 DIAGNOSIS — I10 Essential (primary) hypertension: Secondary | ICD-10-CM | POA: Insufficient documentation

## 2023-02-05 DIAGNOSIS — R1032 Left lower quadrant pain: Secondary | ICD-10-CM | POA: Insufficient documentation

## 2023-02-05 DIAGNOSIS — R1011 Right upper quadrant pain: Secondary | ICD-10-CM | POA: Insufficient documentation

## 2023-02-05 DIAGNOSIS — R1031 Right lower quadrant pain: Secondary | ICD-10-CM | POA: Diagnosis not present

## 2023-02-05 DIAGNOSIS — I2510 Atherosclerotic heart disease of native coronary artery without angina pectoris: Secondary | ICD-10-CM | POA: Diagnosis not present

## 2023-02-05 DIAGNOSIS — Z79899 Other long term (current) drug therapy: Secondary | ICD-10-CM | POA: Diagnosis not present

## 2023-02-05 DIAGNOSIS — E119 Type 2 diabetes mellitus without complications: Secondary | ICD-10-CM | POA: Insufficient documentation

## 2023-02-05 DIAGNOSIS — Z7982 Long term (current) use of aspirin: Secondary | ICD-10-CM | POA: Diagnosis not present

## 2023-02-05 DIAGNOSIS — R112 Nausea with vomiting, unspecified: Secondary | ICD-10-CM | POA: Insufficient documentation

## 2023-02-05 DIAGNOSIS — K760 Fatty (change of) liver, not elsewhere classified: Secondary | ICD-10-CM | POA: Diagnosis not present

## 2023-02-05 DIAGNOSIS — R197 Diarrhea, unspecified: Secondary | ICD-10-CM | POA: Insufficient documentation

## 2023-02-05 DIAGNOSIS — Z7984 Long term (current) use of oral hypoglycemic drugs: Secondary | ICD-10-CM | POA: Insufficient documentation

## 2023-02-05 LAB — CBC WITH DIFFERENTIAL/PLATELET
Abs Immature Granulocytes: 0.11 10*3/uL — ABNORMAL HIGH (ref 0.00–0.07)
Basophils Absolute: 0 10*3/uL (ref 0.0–0.1)
Basophils Relative: 0 %
Eosinophils Absolute: 0.1 10*3/uL (ref 0.0–0.5)
Eosinophils Relative: 1 %
HCT: 42.5 % (ref 39.0–52.0)
Hemoglobin: 13.3 g/dL (ref 13.0–17.0)
Immature Granulocytes: 1 %
Lymphocytes Relative: 26 %
Lymphs Abs: 2.4 10*3/uL (ref 0.7–4.0)
MCH: 30 pg (ref 26.0–34.0)
MCHC: 31.3 g/dL (ref 30.0–36.0)
MCV: 95.9 fL (ref 80.0–100.0)
Monocytes Absolute: 0.8 10*3/uL (ref 0.1–1.0)
Monocytes Relative: 8 %
Neutro Abs: 6.1 10*3/uL (ref 1.7–7.7)
Neutrophils Relative %: 64 %
Platelets: 223 10*3/uL (ref 150–400)
RBC: 4.43 MIL/uL (ref 4.22–5.81)
RDW: 14.8 % (ref 11.5–15.5)
WBC: 9.5 10*3/uL (ref 4.0–10.5)
nRBC: 0 % (ref 0.0–0.2)

## 2023-02-05 LAB — URINALYSIS, W/ REFLEX TO CULTURE (INFECTION SUSPECTED)
Bacteria, UA: NONE SEEN
Bilirubin Urine: NEGATIVE
Glucose, UA: >=500 mg/dL — AB
Hgb urine dipstick: NEGATIVE
Ketones, ur: 20 mg/dL — AB
Leukocytes,Ua: NEGATIVE
Nitrite: NEGATIVE
Protein, ur: NEGATIVE mg/dL
Specific Gravity, Urine: 1.043 — ABNORMAL HIGH (ref 1.005–1.030)
pH: 6 (ref 5.0–8.0)

## 2023-02-05 LAB — COMPREHENSIVE METABOLIC PANEL
ALT: 16 U/L (ref 0–44)
AST: 19 U/L (ref 15–41)
Albumin: 3.4 g/dL — ABNORMAL LOW (ref 3.5–5.0)
Alkaline Phosphatase: 64 U/L (ref 38–126)
Anion gap: 11 (ref 5–15)
BUN: 16 mg/dL (ref 8–23)
CO2: 28 mmol/L (ref 22–32)
Calcium: 9.4 mg/dL (ref 8.9–10.3)
Chloride: 99 mmol/L (ref 98–111)
Creatinine, Ser: 1 mg/dL (ref 0.61–1.24)
GFR, Estimated: >60 mL/min (ref >60)
Glucose, Bld: 171 mg/dL — ABNORMAL HIGH (ref 70–99)
Potassium: 3.5 mmol/L (ref 3.5–5.1)
Sodium: 138 mmol/L (ref 135–145)
Total Bilirubin: 1.1 mg/dL (ref 0.3–1.2)
Total Protein: 7.2 g/dL (ref 6.5–8.1)

## 2023-02-05 LAB — CK: Total CK: 30 U/L — ABNORMAL LOW (ref 49–397)

## 2023-02-05 LAB — MAGNESIUM: Magnesium: 2.1 mg/dL (ref 1.7–2.4)

## 2023-02-05 LAB — LIPASE, BLOOD: Lipase: 42 U/L (ref 11–51)

## 2023-02-05 MED ORDER — SODIUM CHLORIDE 0.9 % IV SOLN
12.5000 mg | Freq: Once | INTRAVENOUS | Status: AC
  Administered 2023-02-05: 12.5 mg via INTRAVENOUS
  Filled 2023-02-05: qty 0.5

## 2023-02-05 MED ORDER — PROMETHAZINE HCL 12.5 MG PO TABS: 12.5000 mg | ORAL_TABLET | Freq: Three times a day (TID) | ORAL | 0 refills | Status: DC | PRN

## 2023-02-05 MED ORDER — HYDROMORPHONE HCL 1 MG/ML IJ SOLN
0.5000 mg | Freq: Once | INTRAMUSCULAR | Status: AC
  Administered 2023-02-05: 0.5 mg via INTRAVENOUS
  Filled 2023-02-05: qty 1

## 2023-02-05 MED ORDER — ONDANSETRON HCL 4 MG/2ML IJ SOLN
4.0000 mg | Freq: Once | INTRAMUSCULAR | Status: AC
  Administered 2023-02-05: 4 mg via INTRAVENOUS
  Filled 2023-02-05: qty 2

## 2023-02-05 MED ORDER — ONDANSETRON 4 MG PO TBDP
4.0000 mg | ORAL_TABLET | Freq: Once | ORAL | Status: AC
  Administered 2023-02-05: 4 mg via ORAL
  Filled 2023-02-05: qty 1

## 2023-02-05 MED ORDER — IOHEXOL 350 MG/ML SOLN
75.0000 mL | Freq: Once | INTRAVENOUS | Status: AC | PRN
  Administered 2023-02-05: 75 mL via INTRAVENOUS

## 2023-02-05 MED ORDER — LACTATED RINGERS IV BOLUS
1000.0000 mL | Freq: Once | INTRAVENOUS | Status: AC
  Administered 2023-02-05: 1000 mL via INTRAVENOUS

## 2023-02-05 NOTE — ED Notes (Signed)
Patient transported to [facility] 

## 2023-02-05 NOTE — ED Provider Triage Note (Signed)
Emergency Medicine Provider Triage Evaluation Note  Jeremiah [name] , a 77 y.o. male  was evaluated in triage.  Pt complains of vomiting.  Patient reports he has a 2-1/74-monthhistory of abdominal pain, nausea, vomiting and has been evaluated by [facility]ie [facility] extensively for this any acute diagnosis made of his condition.  Patient feels frustrated as nothing has been definitively diagnosed.  States that he has had endoscopies and colonoscopies performed with noted ulcers throughout his digestive track.  Denies any hematemesis.  Review of Systems  Positive: As above Negative: As above  Physical Exam  BP (!) 102/58   Pulse 72   Temp 97.7 F (36.5 C) (Oral)   Resp (!) 24   Ht '5\' 7"'$  (1.702 m)   Wt 89.8 kg   SpO2 98%   BMI 31.01 kg/m  Gen:   Awake, no distress   Resp:  Normal effort  MSK:   Moves extremities without difficulty  Other:  Significant abdominal TTP  Medical Decision Making  Medically screening exam initiated at 5:33 PM.  Appropriate orders placed.  JEmmerich Padalinowas informed that the remainder of the evaluation will be completed by another provider, this initial triage assessment does not replace that evaluation, and the importance of remaining in the ED until their evaluation is complete.     ZLuvenia Heller PA-C 02/05/23 1734

## 2023-02-05 NOTE — ED Triage Notes (Signed)
Pt c/o nausea, vomiting, diarrhea for 2 months. Seen by GI without definitive diagnosis. Pt states he is unable to keep food/fluids down and now experiencing severe cramps.

## 2023-02-05 NOTE — ED Provider Notes (Signed)
[facility] Provider Note   CSN: [id number] Arrival date & time: 02/05/23  1558     History  Chief Complaint  Patient presents with   Emesis    Jeremiah [name] is a 77 y.o. male.  HPI 77 year old male with a history of multiple comorbidities which include CAD, diabetes, hypertension, presents with intractable vomiting and abdominal pain.  Has been dealing with vomiting and abdominal pain for 3 months or so.  He is also been having diarrhea during this time.  This all started right after he had hernia surgery.  He has had multiple workups from his gastroenterologist, Dr. Therisa Doyne, including a CT last month as well as a gastric emptying study and EGD and colonoscopy. He's been taking zofran and was prescribed a PPI, carafate and reglan. He's been having lower abdominal pain as well as cramps. The cramping is worse today. He's unable to tolerate PO and has been losing weight.   Home Medications Prior to Admission medications   Medication Sig Start Date End Date Taking? Authorizing Provider  albuterol (PROVENTIL) (2.5 MG/3ML) 0.083% nebulizer solution Take 3 mLs (2.5 mg total) by nebulization every 4 (four) hours as needed for wheezing or shortness of breath. 01/01/19   Tanda Rockers, MD  arformoterol (BROVANA) 15 MCG/2ML NEBU Take 2 mLs (15 mcg total) by nebulization 2 (two) times daily. 11/07/21   Tanda Rockers, MD  aspirin 81 MG EC tablet Take 81 mg by mouth daily. 06/23/21   [provider]  atorvastatin (LIPITOR) 80 MG tablet Take 80 mg by mouth daily.    [provider]  betamethasone dipropionate 0.05 % cream Apply 1 Application topically 2 (two) times daily as needed for dry skin. 05/18/22   [provider]  bisoprolol (ZEBETA) 5 MG tablet TAKE 1 TABLET(5 MG) BY MOUTH DAILY Patient taking differently: Take 5 mg by mouth daily. 04/20/22   Hilty, Nadean Corwin, MD  empagliflozin (JARDIANCE) 25 MG TABS tablet TAKE 1 TABLET(25  MG) BY MOUTH DAILY BEFORE BREAKFAST Patient taking differently: Take 25 mg by mouth daily. 05/03/22   Hilty, Nadean Corwin, MD  esomeprazole (NEXIUM) 40 MG capsule Take 40 mg by mouth 2 (two) times daily before a meal. 12/20/22   [provider]  ezetimibe (ZETIA) 10 MG tablet TAKE 1 TABLET(10 MG) BY MOUTH DAILY 07/16/22   Hilty, Nadean Corwin, MD  ferrous sulfate (FEROSUL) 325 (65 FE) MG tablet TAKE 1 TABLET BY MOUTH DAILY WITH BREAKFAST Patient taking differently: Take 325 mg by mouth daily with breakfast. 05/03/22   Hilty, Nadean Corwin, MD  formoterol (PERFOROMIST) 20 MCG/2ML nebulizer solution Take 20 mcg by nebulization 2 (two) times daily. 10/12/20   [provider]  furosemide (LASIX) 40 MG tablet Take 1 tablet (40 mg total) by mouth daily. 01/24/23   Hilty, Nadean Corwin, MD  glimepiride (AMARYL) 4 MG tablet Take 4 mg by mouth daily with breakfast.    [provider]  HYDROcodone-acetaminophen (NORCO/VICODIN) 5-325 MG tablet Take 1-2 tablets by mouth every 6 (six) hours as needed for moderate pain. 05/27/22   Oswald Hillock, MD  levothyroxine (SYNTHROID, LEVOTHROID) 88 MCG tablet Take 88 mcg by mouth daily before breakfast.    [provider]  metFORMIN (GLUCOPHAGE-XR) 500 MG 24 hr tablet Take 2,000 mg by mouth daily.    [provider]  OXYGEN Inhale 3 L into the lungs at bedtime. 4 lpm with 24/7 [facility]    [provider]  revefenacin (YUPELRI) 175 MCG/3ML nebulizer solution Inhale 3 mLs (175 mcg total) into the lungs every morning. 07/20/21   Tanda Rockers, MD  triamcinolone cream (KENALOG) 0.5 % Apply 1 Application topically 2 (two) times daily. 05/09/22   [provider]      Allergies    Shellfish allergy and Fluzone [influenza virus vaccine]    Review of Systems   Review of Systems  Constitutional:  Positive for unexpected weight change. Negative for fever.  Respiratory:  Negative for shortness of breath.   Cardiovascular:  Negative for chest  pain.  Gastrointestinal:  Positive for abdominal pain, diarrhea and vomiting.  Musculoskeletal:  Positive for myalgias.    Physical Exam Updated Vital Signs BP (!) 151/60   Pulse 84   Temp 98 F (36.7 C)   Resp 16   Ht '5\' 7"'$  (1.702 m)   Wt 89.8 kg   SpO2 97%   BMI 31.01 kg/m  Physical Exam Vitals and nursing note reviewed.  Constitutional:      General: He is not in acute distress.    Appearance: He is well-developed. He is not ill-appearing or diaphoretic.  HENT:     Head: Normocephalic and atraumatic.  Cardiovascular:     Rate and Rhythm: Normal rate and regular rhythm.     Heart sounds: Normal heart sounds.  Pulmonary:     Effort: Pulmonary effort is normal.     Breath sounds: Normal breath sounds.  Abdominal:     Palpations: Abdomen is soft.     Tenderness: There is abdominal tenderness in the right upper quadrant, right lower quadrant and left lower quadrant.  Skin:    General: Skin is warm and dry.  Neurological:     Mental Status: He is alert.     ED Results / Procedures / Treatments   Labs (all labs ordered are listed, but only abnormal results are displayed) Labs Reviewed  COMPREHENSIVE METABOLIC PANEL - Abnormal; Notable for the following components:      Result Value   Glucose, Bld 171 (*)    Albumin 3.4 (*)    All other components within normal limits  CBC WITH DIFFERENTIAL/PLATELET - Abnormal; Notable for the following components:   Abs Immature Granulocytes 0.11 (*)    All other components within normal limits  CK - Abnormal; Notable for the following components:   Total CK 30 (*)    All other components within normal limits  LIPASE, BLOOD  MAGNESIUM  URINALYSIS, W/ REFLEX TO CULTURE (INFECTION SUSPECTED)    EKG EKG Interpretation  Date/Time:  Tuesday February 05 2023 20:04:02 EST Ventricular Rate:  72 PR Interval:  156 QRS Duration: 96 QT Interval:  417 QTC Calculation: 457 R Axis:   26 Text Interpretation: Sinus rhythm Abnormal T,  consider ischemia, diffuse leads no significant change since June 2023 Confirmed by Sherwood Gambler [phone]) on 02/05/2023 10:22:05 PM  Radiology CT ABDOMEN PELVIS W CONTRAST  Result Date: 02/05/2023 CLINICAL DATA:  Nausea, vomiting.  Bowel obstruction suspected. EXAM: CT ABDOMEN AND PELVIS WITH CONTRAST TECHNIQUE: Multidetector CT imaging of the abdomen and pelvis was performed using the standard protocol following bolus administration of intravenous contrast. RADIATION DOSE REDUCTION: This exam was performed according to the departmental dose-optimization program which includes automated exposure control, adjustment of the mA and/or kV according to patient size and/or use of iterative reconstruction technique. CONTRAST:  76m OMNIPAQUE IOHEXOL 350 MG/ML SOLN COMPARISON:  01/16/2023 FINDINGS: Lower chest: No acute abnormality. Dense mitral valve annular calcifications.  Coronary artery and aortic calcifications. Hepatobiliary: Haziness around the gallbladder. No visible stones. No biliary ductal dilatation. No focal hepatic abnormality. Pancreas: No focal abnormality or ductal dilatation. Spleen: No focal abnormality.  Normal size. Adrenals/Urinary Tract: No adrenal abnormality. No focal renal abnormality. No stones or hydronephrosis. Urinary bladder is unremarkable. Stomach/Bowel: Normal appendix. Stomach, large and small bowel grossly unremarkable. Vascular/Lymphatic: Aortic atherosclerosis. No evidence of aneurysm or adenopathy. Reproductive: No visible focal abnormality. Other: No free fluid or free air. Musculoskeletal: No acute bony abnormality. IMPRESSION: Slight haziness around the gallbladder. No visible gallstones. This could be further evaluated with right upper quadrant ultrasound. Coronary artery disease, aortic atherosclerosis. No evidence of bowel obstruction. Electronically Signed   By: Rolm Baptise M.D.   On: 02/05/2023 22:39    Procedures Procedures    Medications Ordered in ED Medications   promethazine (PHENERGAN) 12.5 mg in sodium chloride 0.9 % 50 mL IVPB (has no administration in time range)  HYDROmorphone (DILAUDID) injection 0.5 mg (has no administration in time range)  lactated ringers bolus 1,000 mL (has no administration in time range)  ondansetron (ZOFRAN-ODT) disintegrating tablet 4 mg (4 mg Oral Given 02/05/23 1759)  lactated ringers bolus 1,000 mL (0 mLs Intravenous Stopped 02/05/23 2133)  HYDROmorphone (DILAUDID) injection 0.5 mg (0.5 mg Intravenous Given 02/05/23 2028)  ondansetron (ZOFRAN) injection 4 mg (4 mg Intravenous Given 02/05/23 2027)  iohexol (OMNIPAQUE) 350 MG/ML injection 75 mL (75 mLs Intravenous Contrast Given 02/05/23 2236)    ED Course/ Medical Decision Making/ A&P                             Medical Decision Making Amount and/or Complexity of Data Reviewed Labs: ordered.    Details: Labs show no AKI or significant electrolyte disturbance. Radiology: ordered and independent interpretation performed.    Details: No obstruction on the CT. ECG/medicine tests: ordered and independent interpretation performed.    Details: No acute ischemia.  Risk Prescription drug management.   Patient presents with continued vomiting for months.  He has had an extensive workup which I reviewed in the chart.  I also discussed with his gastroenterologist, Dr. Therisa Doyne, and there is no obvious further workup right now.  If he gets admitted she will see in consultation but at this point patient is no longer vomiting once he is in the treatment room.  He was given a small dose of pain medicine and Zofran.  He will be given some Phenergan as well to see if this helps and may need a Phenergan prescription if this works.  Otherwise he has been given fluids.  Given the questionable gallbladder abnormality and ultrasound has been ordered.  Care transferred to Dr. Ralene Bathe.        Final Clinical Impression(s) / ED Diagnoses Final diagnoses:  None    Rx / DC Orders ED Discharge  Orders     None         Sherwood Gambler, MD 02/05/23 2323

## 2023-02-06 ENCOUNTER — Encounter: Payer: Self-pay | Admitting: Gastroenterology

## 2023-02-12 DIAGNOSIS — K559 Vascular disorder of intestine, unspecified: Secondary | ICD-10-CM | POA: Diagnosis not present

## 2023-02-12 DIAGNOSIS — E119 Type 2 diabetes mellitus without complications: Secondary | ICD-10-CM | POA: Diagnosis not present

## 2023-02-12 DIAGNOSIS — E876 Hypokalemia: Secondary | ICD-10-CM | POA: Diagnosis not present

## 2023-02-12 DIAGNOSIS — R112 Nausea with vomiting, unspecified: Secondary | ICD-10-CM | POA: Diagnosis not present

## 2023-02-12 DIAGNOSIS — E039 Hypothyroidism, unspecified: Secondary | ICD-10-CM | POA: Diagnosis not present

## 2023-02-12 DIAGNOSIS — K760 Fatty (change of) liver, not elsewhere classified: Secondary | ICD-10-CM | POA: Diagnosis not present

## 2023-02-12 DIAGNOSIS — A0811 Acute gastroenteropathy due to Norwalk agent: Secondary | ICD-10-CM | POA: Diagnosis not present

## 2023-02-12 DIAGNOSIS — E873 Alkalosis: Secondary | ICD-10-CM | POA: Diagnosis not present

## 2023-02-12 DIAGNOSIS — I252 Old myocardial infarction: Secondary | ICD-10-CM | POA: Diagnosis not present

## 2023-02-12 DIAGNOSIS — R197 Diarrhea, unspecified: Secondary | ICD-10-CM | POA: Diagnosis not present

## 2023-02-12 DIAGNOSIS — E785 Hyperlipidemia, unspecified: Secondary | ICD-10-CM | POA: Diagnosis not present

## 2023-02-12 DIAGNOSIS — R634 Abnormal weight loss: Secondary | ICD-10-CM | POA: Diagnosis not present

## 2023-02-12 DIAGNOSIS — I110 Hypertensive heart disease with heart failure: Secondary | ICD-10-CM | POA: Diagnosis not present

## 2023-02-12 DIAGNOSIS — E878 Other disorders of electrolyte and fluid balance, not elsewhere classified: Secondary | ICD-10-CM | POA: Diagnosis not present

## 2023-02-12 DIAGNOSIS — I5032 Chronic diastolic (congestive) heart failure: Secondary | ICD-10-CM | POA: Diagnosis not present

## 2023-02-12 DIAGNOSIS — G4733 Obstructive sleep apnea (adult) (pediatric): Secondary | ICD-10-CM | POA: Diagnosis not present

## 2023-02-12 DIAGNOSIS — I959 Hypotension, unspecified: Secondary | ICD-10-CM | POA: Diagnosis not present

## 2023-02-12 DIAGNOSIS — E860 Dehydration: Secondary | ICD-10-CM | POA: Diagnosis not present

## 2023-02-13 DIAGNOSIS — E876 Hypokalemia: Secondary | ICD-10-CM | POA: Diagnosis not present

## 2023-02-13 DIAGNOSIS — R109 Unspecified abdominal pain: Secondary | ICD-10-CM | POA: Diagnosis not present

## 2023-02-13 DIAGNOSIS — K559 Vascular disorder of intestine, unspecified: Secondary | ICD-10-CM | POA: Diagnosis not present

## 2023-02-13 DIAGNOSIS — A0811 Acute gastroenteropathy due to Norwalk agent: Secondary | ICD-10-CM | POA: Diagnosis not present

## 2023-02-14 ENCOUNTER — Telehealth: Payer: Self-pay | Admitting: Internal Medicine

## 2023-02-14 DIAGNOSIS — A0811 Acute gastroenteropathy due to Norwalk agent: Secondary | ICD-10-CM | POA: Diagnosis not present

## 2023-02-14 DIAGNOSIS — K559 Vascular disorder of intestine, unspecified: Secondary | ICD-10-CM | POA: Diagnosis not present

## 2023-02-14 DIAGNOSIS — E876 Hypokalemia: Secondary | ICD-10-CM | POA: Diagnosis not present

## 2023-02-14 NOTE — Telephone Encounter (Signed)
Patient calling want a sooner appt with dr. Debara Pickett didn't want to see the PA. Ask that the nurse or Dr gives him a call back. Please advise

## 2023-02-14 NOTE — Telephone Encounter (Signed)
"  Last week I was in [facility] ED. This week I was in [facility], pt was told to follow up with cardiologist." Jeremiah [name] gave an available appt with Dr. Debara Pickett Monday 18th.

## 2023-02-18 ENCOUNTER — Encounter: Payer: Self-pay | Admitting: Internal Medicine

## 2023-02-18 ENCOUNTER — Ambulatory Visit: Payer: Federal, State, Local not specified - PPO | Attending: Internal Medicine | Admitting: Internal Medicine

## 2023-02-18 VITALS — BP 124/68 | HR 88 | Ht 68.0 in | Wt 194.0 lb

## 2023-02-18 DIAGNOSIS — I4891 Unspecified atrial fibrillation: Secondary | ICD-10-CM | POA: Diagnosis not present

## 2023-02-18 DIAGNOSIS — I050 Rheumatic mitral stenosis: Secondary | ICD-10-CM | POA: Insufficient documentation

## 2023-02-18 DIAGNOSIS — I342 Nonrheumatic mitral (valve) stenosis: Secondary | ICD-10-CM

## 2023-02-18 DIAGNOSIS — I4819 Other persistent atrial fibrillation: Secondary | ICD-10-CM | POA: Insufficient documentation

## 2023-02-18 DIAGNOSIS — K559 Vascular disorder of intestine, unspecified: Secondary | ICD-10-CM

## 2023-02-18 DIAGNOSIS — Z01812 Encounter for preprocedural laboratory examination: Secondary | ICD-10-CM

## 2023-02-18 DIAGNOSIS — Z951 Presence of aortocoronary bypass graft: Secondary | ICD-10-CM | POA: Diagnosis not present

## 2023-02-18 DIAGNOSIS — I272 Pulmonary hypertension, unspecified: Secondary | ICD-10-CM

## 2023-02-18 MED ORDER — APIXABAN 5 MG PO TABS: 5.0000 mg | ORAL_TABLET | Freq: Two times a day (BID) | ORAL | 11 refills | Status: DC

## 2023-02-18 MED ORDER — APIXABAN 5 MG PO TABS: 5.0000 mg | ORAL_TABLET | Freq: Two times a day (BID) | ORAL | 0 refills | Status: DC

## 2023-02-18 NOTE — Patient Instructions (Addendum)
Medication Instructions:  STOP aspirin  START eliquis twice daily  *If you need a refill on your cardiac medications before your next appointment, please call your pharmacy*   Lab Work: CBC, BMET   If you have labs (blood work) drawn today and your tests are completely normal, you will receive your results only by: [place] (if you have MyChart) OR A paper copy in the mail If you have any lab test that is abnormal or we need to change your treatment, we will call you to review the results.   Testing/Procedures: Transesophageal Echocardiogram/Cardioversion    Follow-Up: At [facility], you and your health needs are our priority.  As part of our continuing mission to provide you with exceptional heart care, we have created designated Provider Care Teams.  These Care Teams include your primary Cardiologist (physician) and Advanced Practice Providers (APPs -  Physician Assistants and Nurse Practitioners) who all work together to provide you with the care you need, when you need it.  We recommend signing up for the patient portal called "MyChart".  Sign up information is provided on this After Visit Summary.  MyChart is used to connect with patients for Virtual Visits (Telemedicine).  Patients are able to view lab/test results, encounter notes, upcoming appointments, etc.  Non-urgent messages can be sent to your provider as well.   To learn more about what you can do with MyChart, go to [email or web address].    Your next appointment:    End of April or early May with Dr. Debara Pickett  Other Instructions     Dear Jeremiah [name]   You are scheduled for a TEE (Transesophageal Echocardiogram) Guided Cardioversion on Wednesday, April 3 with Dr. Johney Frame.  Please arrive at the [facility] (Main Entrance A) at [facility]: [address] [place], [place] [address] at 8:00am     DIET:  Nothing to eat or drink after midnight except a sip of water with medications (see  medication instructions below)  MEDICATION INSTRUCTIONS:  CONTINUE all current medications DO NOT stop Eliquis - take this the morning of your procedure along with any other current morning medications  LABS:  Come to Dr. Lysbeth Penner office for non-fasting lab work ONE WEEK prior to procedure  FYI:  For your safety, and to allow [place] to monitor your vital signs accurately during the surgery/procedure we request: If you have artificial nails, gel coating, SNS etc, please have those removed prior to your surgery/procedure. Not having the nail coverings /polish removed may result in cancellation or delay of your surgery/procedure.  You must have a responsible person to drive you home and stay in the waiting area during your procedure. Failure to do so could result in cancellation.  Bring your insurance cards.  *Special Note: Every effort is made to have your procedure done on time. Occasionally there are emergencies that occur at the hospital that may cause delays. Please be patient if a delay does occur.

## 2023-02-18 NOTE — Progress Notes (Signed)
OFFICE NOTE  Chief Complaint:  Follow-up hospitalization, echo  Primary Care Physician: [name], MD  HPI:  Rubin Kai is a 77 y.o. 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 He is originally from [place] and his wife who is accompanying him today is originally from [place]. Mr. Teo had an MI in 2006 and underwent coronary artery bypass grafting with a LIMA to LAD, SVG to OM1, and SVG to RPDA. He did fairly well with this however 2015 was having chest discomfort. He underwent a nuclear stress test which showed possible reversible ischemia. Therefore he was referred for cardiac catheterization at [facility] [place] [facility] [place]. That demonstrated all 3 patent bypass grafts with multivessel coronary disease. At that time he was also having episodes of palpitations and a monitor demonstrated PSVT. He was scheduled for possible ablation but is had chronic problems with an ankle fracture and nonhealing wound. He therefore never underwent ablation. He was placed on digoxin and has been on metoprolol but stopped the digoxin at some point in the past. He reports over the past 8-12 months she's had no further palpitations. He also has a history of dyslipidemia, hypothyroidism, and borderline diabetes. Recently he had a sleep study through [facility] which demonstrated severe obstructive sleep apnea and BiPAP was recommended with settings of 19/14 cm water pressure. Currently he denies any chest pain or worsening shortness of breath. His last echocardiogram from his cardiologist in [place] was a 2015 which showed an EF of 58% and no significant valvular disease.  07/26/2016  Mr. Kopke returns today for follow-up. He was recently seen in the emergency department in the beginning of June for chest pain. This was associated with tachycardia and probable SVT. Troponin was noted to be elevated to 0.12. He had signs and symptoms concerning for unstable angina. He was  evaluated by my partners and felt to need cardiac catheterization. Eventually he underwent cardiac catheterization by Dr. Tamala Julian with the results as follows:  Conclusion   LM lesion, 85% stenosed. Ost 1st Mrg lesion, 100% stenosed. SVG was injected . Ost RCA to Prox RCA lesion, 100% stenosed. SVG . Origin to Prox Graft lesion, 40% stenosed. Mid RCA to Dist RCA lesion, 100% stenosed. Prox LAD lesion, 100% stenosed. LIMA .   Patent saphenous vein grafts. SVG to PDA contains eccentric 40% proximal stenosis. SVG to the circumflex is widely patent. LIMA to the LAD is widely patent. Native distal left main contains 75% stenosis. The dominant obtuse marginal branch is totally occluded. The native right coronary is totally occluded. Left ventriculogram demonstrates inferior wall hypokinesis. EF 50%. Recent chest pain in the setting of PSVT with minimal enzyme elevation likely related to demand ischemia in the setting of underlying native coronary disease.   Recommendations: Medical therapy. Management of PSVT with medications versus ablation.   Since discharge she denies any further episodes of tachycardia palpitations. He is already on a high dose of beta blocker. He's had a small amount of weight gain but has stopped smoking.  He plans to start to do more exercise and work on weight loss. I'm concerned about his recurrent palpitations and the fact that asymptomatic and has demand ischemia related to them and bypass graft insufficiency.  06/10/2017  Mr. Mcfail returns today for follow-up. It's been almost a year since I last saw him. He was found to have recurrent symptomatic SVT and underwent comprehensive EP study and was diagnosed with classic AV nodal reentry  tachycardia and subsequently underwent selective radiofrequency ablation by Dr. Curt Bears. He says since that time he's had no further episodes of tachypalpitations. Since then he was hospitalized twice, after developing C. difficile colitis  and enteritis with GI bleed in January. Subsequently he developed acute congestive heart failure was found to have a mild reduction in LVEF to 45-50% by echo in March 2018. At that time he was on furosemide 40 mg daily. He had a follow-up with Almyra Deforest, PA-C in April who noted he was on Lasix 40 mg twice daily, however he reported that he was not taking that to me today. Fact he says he is now off of Lasix. He reports worsening shortness of breath and hasn't fact had a 13 pound weight gain since April. He denies any lower extremity edema. He has been working with Dr. Melvyn Novas for his shortness of breath.  07/22/2017  Mr. Shake is back today for follow-up. He restarted his Lasix at my direction his weight is now down from 252-244. He's had significant improvement in swelling and reports some improvement in his breathing however he says it still not good. He does have a mildly reduced EF of 45-50% but also has some chronic lung disease which is likely contributing. He says he gets short of breath while doing certain activities in the yard and then comes inside and uses his oxygen which improves him almost immediately. He denies any orthopnea. He has not had any recent or productive cough. He has a follow-up visit with his pulmonologist tomorrow. I had ordered lab work including a metabolic profile and BNP which was never obtained.  09/09/2018  Mr. Yazell is seen today in follow-up.  He reports some improvement in his shortness of breath.  He has joined a gym and now is exercising every day.  Weight however has been stable at 252, actually up 2 pounds from April.  Despite this his breathing has improved somewhat.  He still uses oxygen.  He did have one episode recently where he became nauseated and vomited.  He was up at the [place] and had been taking antibiotics for a dental infection.  He did undergo CT scan of the head and neck and that demonstrated bilateral carotid artery calcification.  This is not  surprising given his history of coronary artery disease and prior CABG however he has not had prior carotid Dopplers.    02/18/2019  Mr. Weddel is seen today in follow-up.  Recently he says has had some improvement in shortness of breath.  Dr. Melvyn Novas, his pulmonologist had started him on a new nebulizer called revefenacin.  His most recent hemoglobin A1c was 7.1.  Weight is down about 4 pounds.  He denies any new chest pain.  He has had some recent anemia and work-up is apparently underway for this.  He is tentatively scheduled for an endoscopy on April 16 with Dr. Therisa Doyne.  He is concerned because of his pulmonary status that he may be at high risk of decompensation with the procedure.  He also has follow-up with his PCP in the near future.  09/14/2019  Mr. Shisler returns today for follow-up.  He continues to do well.  His shortness of breath has improved which I think was both believed due to a combination of pulmonary optimization as well as improvement in LV function.  Recently his echo in fact showed LVEF has normalized to 60 to 65%.  He remains euvolemic and is actually 2 pounds lighter than he was seen  about 8 months ago.  Hemoglobin A1c has been reasonably well-controlled however his lipids are still uncontrolled.  His total cholesterol is 211, triglycerides 494, HDL 32 and LDL of 114.  His target LDL is less than 70 and his elevated triglycerides also put him at increased cardiovascular risk.  03/02/2020  Mr. Dusza is seen today in follow-up.  He denies any chest pain but does have some shortness of breath.  Is been some weight gain.  He reports less physical activity has been struggling with a long recovery after undergoing circumcision for issues with phimosis.  Its unfortunate that he had a difficult recovery but seems to be doing better.  Blood pressure was well controlled today.  His EKG demonstrated some worsening inferior and lateral T wave inversions.  This was previously seen however not as  significant.  His last heart cath was in 2017 which did show a patent SVG to OM and occluded SVG to right coronary.  He is anticipating restarting an exercise program and working with a Clinical research associate.  02/24/2021  Mr. Krager is seen today in follow-up.  Overall he says he is feeling reasonably well.  He has had some shortness of breath.  He is scheduled for a sleep study.  We discussed this because of the previously mentioned issues and that is actually scheduled tonight.  He also has a history of heart failure with diabetes which is not at target A1c of 8.3.  Lipids recently at assess her total cholesterol 106, HDL 33, LDL 49 and triglycerides 134.  07/18/2021  Mr. Hinely is seen today for routine follow-up.  He seems to be doing well on the Jardiance at 25 mg daily.  Hemoglobin A1c is now come down to 7.3.  Blood pressure is well controlled.  EKG is unchanged showing sinus rhythm with some lateral T wave changes.  He has noted some lower blood pressures at home.  He says around the mid 90s over 71s.  Blood pressure here was elevated 130/90 however more typical readings are between 90 and 185 systolic.  He says he is asymptomatic for this.  He is on very little medicine.  1 option would be to consider backing off on the bisoprolol.  Cholesterol is very well controlled as of February total 106, HDL 33, LDL 49 triglycerides 134.  I do not believe it is "too low".  Would recommend continue his current regimen.  02/02/2022  Mr. Schnebly is seen today in follow-up.  Overall he says he is doing very well.  Weight has been fairly stable.  Blood pressure is well controlled.  He has been undergoing some rehab for a frozen shoulder.  He denies any worsening shortness of breath.  Recent labs showed total cholesterol 153, HDL 33 triglycerides 291 and LDL 73.  A1c 7.3%.  EKG shows a sinus rhythm with some inferolateral T wave changes (unchanged from prior EKG in 2022).  09/07/2022  Ewell is seen today in follow-up.  Overall  he seems to be doing pretty well.  He is actually lost quite a bit of weight recently.  He is down to 215 pounds but was as high as 245 back in March of this past year.  He has made some dietary changes.  He is also had some issues with his GI system and is having an upcoming endoscopy.  Blood pressure is well controlled.  He denies any chest pain.  He did have a recent syncopal episode.  He said he was working on his  house and had been not sleeping well and thought he was dehydrated and simply passed out.  It does not sound like there was a clear prodrome prior to that but if he feels it was likely related to exhaustion.  He has never passed out before.  He has not had any subsequent episodes.  12/27/2022  Latrell returns today for follow-up.  Recently he is followed up with Dr. Melvyn Novas.  Has been having issues with reflux and had EGD and colonoscopy.  He was noted to have I believe 24 lesions of the esophagus, likely Barrett's esophagus.  He has had vomiting and is scheduled for a gastric emptying study.  Despite this, he is also made significant dietary changes and reports in the past 3 months he is lost about 35 pounds.  It was noted that he is markedly hypotensive today with blood pressure 82/46.  He was also seen recently as mentioned with Dr. Melvyn Novas and had a CT scan of the chest.  Besides emphysema this demonstrated aortic valve calcification as well as pulmonary artery dilatation suggestive of pulmonary hypertension.  He has been short of breath but uses home oxygen.  He also tells me that he has an upcoming visit for periodontal surgery with Dr. Buelah Manis.  02/18/2023  Khamal is seen today as an add-on visit.  Unfortunately he was just hospitalized over the past week with dehydration, nausea vomiting and diarrhea as well as abdominal pain.  He was seen in [facility] ER and stabilized and then presented to his PCP was sent to [facility].  There he was admitted and found to have norovirus gastroenteritis in addition to what  was described as an ischemic colitis.  His medications were held including his diuretics and blood pressure medicines to allow higher blood pressure.  I had recently cut back on his blood pressure medicine in January, stopping his losartan.  Since discharge she has had some persistent diarrhea.  He was noted to be somewhat anemic with a hemoglobin of 11.4.  His echo was read by myself and surprisingly indicated that he has moderate to severe mitral stenosis with elevated left atrial pressure.  This is likely contributing to some of his symptoms of shortness of breath.  Today he says he feels quite fatigued and is very symptomatic.  He is lost a significant amount of weight down another 15 pounds since I saw him in January.  In addition to the mitral stenosis, his EKG today shows atrial fibrillation.  This is a new diagnosis for him.  It does not appear that he had an EKG at [facility] that I could find but did have an EKG at [facility] which showed sinus rhythm with marked sinus arrhythmia although I would say it is questionably A-fib.  PMHx:  Past Medical History:  Diagnosis Date   A-fib ([place])    Anemia    Aortic valve sclerosis 12/2018   Noted on ECHO   Arthritis    CAD (coronary artery disease)    a. 2006: CABG in 2006 with LIMA-LAD, SVG-OM1, and SVG-RPDA   Cardiomegaly 12/2018   Stable, noted on CXR   Carpal tunnel syndrome    Right   Chronic pain 03/21/2016   Colon cancer ([place]) 2006   COPD (chronic obstructive pulmonary disease) ([place])    Diabetes mellitus without complication ([place])    DVT (deep venous thrombosis) ([place])    Right   Essential hypertension 03/21/2016   GERD (gastroesophageal reflux disease)    History of blood transfusion  History of Clostridioides difficile infection    History of prostate cancer 03/21/2016   History of PSVT (paroxysmal supraventricular tachycardia)    HLD (hyperlipidemia)    HTN (hypertension)    Hx of CABG 2006   Hypercholesteremia 03/21/2016    Hypothyroidism    LAE (left atrial enlargement) 12/2018   Severe, Noted on ECHO   LVH (left ventricular hypertrophy) 12/2018   Mild, Noted on ECHO   Medication management 03/21/2016   Mitral annular calcification 12/2018   with mild MS, Noted on ECHO   Morbid obesity ([place]) 03/21/2016   Myocardial infarct ([facility])    OSA (obstructive sleep apnea) 03/21/2016   uses oxygen at night time   Pain in right ankle and joints of right foot 03/21/2016   Paronychia 03/21/2016   Phimosis    Pneumonia    Primary insomnia 03/21/2016   Prostate cancer ([place]) 2008   Pulmonary hypertension ([place]) 12/2018   Moderate, Noted on ECHO   Tobacco dependence 03/21/2016   Tricuspid regurgitation 12/2018   Mild, Noted on ECHO    Past Surgical History:  Procedure Laterality Date   ANKLE SURGERY Right 12/2013   CARDIAC CATHETERIZATION N/A 05/08/2016   Procedure: Left Heart Cath and Cors/Grafts Angiography;  Surgeon: Belva Crome, MD;  Location: [facility];  Service: Cardiovascular;  Laterality: N/A;   CIRCUMCISION N/A 10/07/2019   Procedure: CIRCUMCISION ADULT;  Surgeon: Ardis Hughs, MD;  Location: [facility];  Service: Urology;  Laterality: N/A;   CORONARY ARTERY BYPASS GRAFT  2006   x3   ELECTROPHYSIOLOGIC STUDY N/A 08/24/2016   Procedure: SVT Ablation;  Surgeon: Will Meredith Leeds, MD;  Location: [facility];  Service: Cardiovascular;  Laterality: N/A;   PROSTATE SURGERY  [id number]   PTCA     UMBILICAL HERNIA REPAIR N/A 05/26/2022   Procedure: PRIMARY REPAIR OF STRANGULATED UMBILICAL HERNIA WITH PARTIAL OMENTECTOMY;  Surgeon: Stark Klein, MD;  Location: [facility];  Service: General;  Laterality: N/A;    FAMHx:  Family History  Problem Relation Age of Onset   Dementia Mother    Diabetes Sister     SOCHx:   reports that he quit smoking about 6 years ago. His smoking use included cigarettes. He has a 45.00 pack-year smoking history. He has never used smokeless tobacco. He reports that he does not  currently use alcohol. He reports that he does not use drugs.  ALLERGIES:  Allergies  Allergen Reactions   Shellfish Allergy Other (See Comments) and Nausea And Vomiting    Rash; shortness of breath   Fluzone [Influenza Virus Vaccine] Nausea And Vomiting, Palpitations and Rash    ROS: Pertinent items noted in HPI and remainder of comprehensive ROS otherwise negative.  HOME MEDS: Current Outpatient Medications  Medication Sig Dispense Refill   albuterol (PROVENTIL) (2.5 MG/3ML) 0.083% nebulizer solution Take 3 mLs (2.5 mg total) by nebulization every 4 (four) hours as needed for wheezing or shortness of breath. 75 mL 12   arformoterol (BROVANA) 15 MCG/2ML NEBU Take 2 mLs (15 mcg total) by nebulization 2 (two) times daily. 120 mL 11   aspirin 81 MG EC tablet Take 81 mg by mouth daily.     atorvastatin (LIPITOR) 80 MG tablet Take 80 mg by mouth daily.     betamethasone dipropionate 0.05 % cream Apply 1 Application topically 2 (two) times daily as needed for dry skin.     ezetimibe (ZETIA) 10 MG tablet TAKE 1 TABLET(10 MG) BY MOUTH DAILY 90  tablet 2   ferrous sulfate (FEROSUL) 325 (65 FE) MG tablet TAKE 1 TABLET BY MOUTH DAILY WITH BREAKFAST (Patient taking differently: Take 325 mg by mouth daily with breakfast.) 90 tablet 3   formoterol (PERFOROMIST) 20 MCG/2ML nebulizer solution Take 20 mcg by nebulization 2 (two) times daily.     furosemide (LASIX) 40 MG tablet Take 1 tablet (40 mg total) by mouth daily. 90 tablet 3   glimepiride (AMARYL) 4 MG tablet Take 4 mg by mouth daily with breakfast.     levothyroxine (SYNTHROID, LEVOTHROID) 88 MCG tablet Take 88 mcg by mouth daily before breakfast.     OXYGEN Inhale 3 L into the lungs at bedtime. 4 lpm with 24/7 AHC     promethazine (PHENERGAN) 12.5 MG tablet Take 1 tablet (12.5 mg total) by mouth every 8 (eight) hours as needed for nausea or vomiting. 20 tablet 0   revefenacin (YUPELRI) 175 MCG/3ML nebulizer solution Inhale 3 mLs (175 mcg total)  into the lungs every morning. 90 mL 11   No current facility-administered medications for this visit.    LABS/IMAGING: No results found for this or any previous visit (from the past 48 hour(s)). No results found.  WEIGHTS: Wt Readings from Last 3 Encounters:  02/18/23 194 lb (88 kg)  02/05/23 198 lb (89.8 kg)  12/27/22 208 lb (94.3 kg)    VITALS: BP 124/68   Pulse 88   Ht 5\' 8"  (1.727 m)   Wt 194 lb (88 kg)   SpO2 97%   BMI 29.50 kg/m   EXAM: General appearance: alert, no distress, and pale Neck: no carotid bruit, no JVD, and thyroid not enlarged, symmetric, no tenderness/mass/nodules Lungs: diminished breath sounds bilaterally Heart: irregularly irregular rhythm, S1, S2 normal, and systolic murmur: holosystolic 3/6, blowing at 2nd right intercostal space Abdomen: Soft, mild tenderness to palpation Extremities: extremities normal, atraumatic, no cyanosis or edema Pulses: 2+ and symmetric Skin: Pale, cool, dry Neurologic: Grossly normal Psych: Pleasant  EKG: Atrial fibrillation with controlled ventricular response at 88-personally reviewed  ASSESSMENT: Newly recognized atrial fibrillation -CHA2DS2-VASc score of 4 Moderate to severe mitral stenosis Ischemic colitis Recent SVT which was symptomatic-repeat cardiac catheterization (05/2016) shows patent grafts, status post ablation of AV nodal reentry tachycardia (08/2016) CAD status post three-vessel CABG in 2006 (LIMA to LAD, SVG to OM1, SVG to RPDA) in [place] Patent bypass grafts by cath in 2015 Dyslipidemia on Lipitor OSA-BiPAP recommended COPD Chronic systolic congestive heart failure-LVEF 45-50% -improved to 60 to 65% (12/2018)  Phimosis Syncope  PLAN: 1.   Mr. Reichl was recently hospitalized with abdominal pain and found to have a norovirus in addition to ischemic colitis.  Recent echo shows moderate to severe mitral stenosis with preserved LVEF.  Today reports significant fatigue and ongoing diarrhea.  EKG  shows what appears to be atrial fibrillation.  This is a new finding compared to his EKG in January.  He does have a history of AVNRT which was ablated in the past in 2017 but no A-fib that I am aware of.  He has been on aspirin.  I advised him to discontinue the aspirin and will start Eliquis 5 mg twice daily today.  I discussed the concern I have about his mitral stenosis and the increased risk of atrial thrombus and risk of stroke regarding atrial fibrillation.  I additionally feel that his A-fib is likely caused significant drop in cardiac output.  He would benefit from restoration of sinus rhythm.  His valve needs to  be study closer as well.  I would advise a TEE/DCCV if he remains in atrial fibrillation at the time of the study to both further evaluate the severity of his mitral stenosis and attempt to get him back into sinus rhythm.  Shared Decision Making/Informed Consent The risks [stroke, cardiac arrhythmias rarely resulting in the need for a temporary or permanent pacemaker, skin irritation or burns, esophageal damage, perforation (1:10,000 risk), bleeding, pharyngeal hematoma as well as other potential complications associated with conscious sedation including aspiration, arrhythmia, respiratory failure and death], benefits (treatment guidance, restoration of normal sinus rhythm, diagnostic support) and alternatives of a transesophageal echocardiogram guided cardioversion were discussed in detail with Mr. Alessandro and he is willing to proceed.   Will try to schedule with myself or one of my partners if there is no reasonably soon availability.  Plan follow-up with me after the procedure.  Pixie Casino, MD, [facility], [place] Director of the Advanced Lipid Disorders &  Cardiovascular Risk Reduction Clinic Diplomate of the American Board of Clinical Lipidology Attending Cardiologist  Direct Dial: [phone]  Fax: [phone]  Website:   [email or web address] 02/18/2023, 9:55 AM

## 2023-02-19 DIAGNOSIS — R197 Diarrhea, unspecified: Secondary | ICD-10-CM | POA: Diagnosis not present

## 2023-02-19 DIAGNOSIS — K559 Vascular disorder of intestine, unspecified: Secondary | ICD-10-CM | POA: Diagnosis not present

## 2023-02-19 DIAGNOSIS — I4891 Unspecified atrial fibrillation: Secondary | ICD-10-CM | POA: Diagnosis not present

## 2023-02-19 DIAGNOSIS — A0811 Acute gastroenteropathy due to Norwalk agent: Secondary | ICD-10-CM | POA: Diagnosis not present

## 2023-02-19 DIAGNOSIS — K529 Noninfective gastroenteritis and colitis, unspecified: Secondary | ICD-10-CM | POA: Diagnosis not present

## 2023-02-19 DIAGNOSIS — K219 Gastro-esophageal reflux disease without esophagitis: Secondary | ICD-10-CM | POA: Diagnosis not present

## 2023-02-19 DIAGNOSIS — Z79899 Other long term (current) drug therapy: Secondary | ICD-10-CM | POA: Diagnosis not present

## 2023-02-27 ENCOUNTER — Other Ambulatory Visit: Payer: Self-pay | Admitting: Internal Medicine

## 2023-02-27 DIAGNOSIS — I4891 Unspecified atrial fibrillation: Secondary | ICD-10-CM

## 2023-02-28 DIAGNOSIS — I480 Paroxysmal atrial fibrillation: Secondary | ICD-10-CM | POA: Diagnosis not present

## 2023-02-28 DIAGNOSIS — J9601 Acute respiratory failure with hypoxia: Secondary | ICD-10-CM | POA: Diagnosis not present

## 2023-02-28 DIAGNOSIS — I959 Hypotension, unspecified: Secondary | ICD-10-CM | POA: Diagnosis not present

## 2023-02-28 DIAGNOSIS — I3481 Nonrheumatic mitral (valve) annulus calcification: Secondary | ICD-10-CM | POA: Diagnosis not present

## 2023-02-28 DIAGNOSIS — I5033 Acute on chronic diastolic (congestive) heart failure: Secondary | ICD-10-CM | POA: Diagnosis not present

## 2023-02-28 DIAGNOSIS — E1165 Type 2 diabetes mellitus with hyperglycemia: Secondary | ICD-10-CM | POA: Diagnosis not present

## 2023-02-28 DIAGNOSIS — R197 Diarrhea, unspecified: Secondary | ICD-10-CM | POA: Diagnosis not present

## 2023-02-28 DIAGNOSIS — A0811 Acute gastroenteropathy due to Norwalk agent: Secondary | ICD-10-CM | POA: Diagnosis not present

## 2023-02-28 DIAGNOSIS — I350 Nonrheumatic aortic (valve) stenosis: Secondary | ICD-10-CM | POA: Diagnosis not present

## 2023-02-28 DIAGNOSIS — E43 Unspecified severe protein-calorie malnutrition: Secondary | ICD-10-CM | POA: Diagnosis not present

## 2023-02-28 DIAGNOSIS — K551 Chronic vascular disorders of intestine: Secondary | ICD-10-CM | POA: Diagnosis not present

## 2023-02-28 DIAGNOSIS — J449 Chronic obstructive pulmonary disease, unspecified: Secondary | ICD-10-CM | POA: Diagnosis not present

## 2023-02-28 DIAGNOSIS — I771 Stricture of artery: Secondary | ICD-10-CM | POA: Diagnosis not present

## 2023-02-28 DIAGNOSIS — I110 Hypertensive heart disease with heart failure: Secondary | ICD-10-CM | POA: Diagnosis not present

## 2023-02-28 DIAGNOSIS — R112 Nausea with vomiting, unspecified: Secondary | ICD-10-CM | POA: Diagnosis not present

## 2023-02-28 DIAGNOSIS — I342 Nonrheumatic mitral (valve) stenosis: Secondary | ICD-10-CM | POA: Diagnosis not present

## 2023-02-28 DIAGNOSIS — E039 Hypothyroidism, unspecified: Secondary | ICD-10-CM | POA: Diagnosis not present

## 2023-03-01 DIAGNOSIS — I342 Nonrheumatic mitral (valve) stenosis: Secondary | ICD-10-CM | POA: Diagnosis not present

## 2023-03-01 DIAGNOSIS — E876 Hypokalemia: Secondary | ICD-10-CM | POA: Diagnosis not present

## 2023-03-01 DIAGNOSIS — I493 Ventricular premature depolarization: Secondary | ICD-10-CM | POA: Diagnosis not present

## 2023-03-01 DIAGNOSIS — K6389 Other specified diseases of intestine: Secondary | ICD-10-CM | POA: Diagnosis not present

## 2023-03-01 DIAGNOSIS — I491 Atrial premature depolarization: Secondary | ICD-10-CM | POA: Diagnosis not present

## 2023-03-01 DIAGNOSIS — R110 Nausea: Secondary | ICD-10-CM | POA: Diagnosis not present

## 2023-03-01 DIAGNOSIS — I4891 Unspecified atrial fibrillation: Secondary | ICD-10-CM | POA: Diagnosis not present

## 2023-03-01 DIAGNOSIS — A0811 Acute gastroenteropathy due to Norwalk agent: Secondary | ICD-10-CM | POA: Diagnosis not present

## 2023-03-01 DIAGNOSIS — K559 Vascular disorder of intestine, unspecified: Secondary | ICD-10-CM | POA: Diagnosis not present

## 2023-03-01 DIAGNOSIS — I480 Paroxysmal atrial fibrillation: Secondary | ICD-10-CM | POA: Diagnosis not present

## 2023-03-02 DIAGNOSIS — I480 Paroxysmal atrial fibrillation: Secondary | ICD-10-CM | POA: Diagnosis not present

## 2023-03-02 DIAGNOSIS — E119 Type 2 diabetes mellitus without complications: Secondary | ICD-10-CM | POA: Diagnosis not present

## 2023-03-02 DIAGNOSIS — E876 Hypokalemia: Secondary | ICD-10-CM | POA: Diagnosis not present

## 2023-03-02 DIAGNOSIS — K559 Vascular disorder of intestine, unspecified: Secondary | ICD-10-CM | POA: Diagnosis not present

## 2023-03-02 DIAGNOSIS — K55059 Acute (reversible) ischemia of intestine, part and extent unspecified: Secondary | ICD-10-CM | POA: Diagnosis not present

## 2023-03-02 DIAGNOSIS — E785 Hyperlipidemia, unspecified: Secondary | ICD-10-CM | POA: Diagnosis not present

## 2023-03-03 DIAGNOSIS — K559 Vascular disorder of intestine, unspecified: Secondary | ICD-10-CM | POA: Diagnosis not present

## 2023-03-03 DIAGNOSIS — E876 Hypokalemia: Secondary | ICD-10-CM | POA: Diagnosis not present

## 2023-03-03 DIAGNOSIS — I480 Paroxysmal atrial fibrillation: Secondary | ICD-10-CM | POA: Diagnosis not present

## 2023-03-04 ENCOUNTER — Telehealth: Payer: Self-pay | Admitting: Internal Medicine

## 2023-03-04 DIAGNOSIS — E876 Hypokalemia: Secondary | ICD-10-CM | POA: Diagnosis not present

## 2023-03-04 DIAGNOSIS — I480 Paroxysmal atrial fibrillation: Secondary | ICD-10-CM | POA: Diagnosis not present

## 2023-03-04 DIAGNOSIS — I080 Rheumatic disorders of both mitral and aortic valves: Secondary | ICD-10-CM | POA: Diagnosis not present

## 2023-03-04 DIAGNOSIS — I5030 Unspecified diastolic (congestive) heart failure: Secondary | ICD-10-CM | POA: Diagnosis not present

## 2023-03-04 DIAGNOSIS — I700 Atherosclerosis of aorta: Secondary | ICD-10-CM | POA: Diagnosis not present

## 2023-03-04 DIAGNOSIS — K559 Vascular disorder of intestine, unspecified: Secondary | ICD-10-CM | POA: Diagnosis not present

## 2023-03-04 DIAGNOSIS — I340 Nonrheumatic mitral (valve) insufficiency: Secondary | ICD-10-CM | POA: Diagnosis not present

## 2023-03-04 NOTE — Telephone Encounter (Signed)
Spoke to wife-patient in hospital at [facility] due to ongoing GI issues.   TEE + Cardioversion scheduled for 4/3.    Advised would cancel procedure and make Dr. Debara Pickett aware.

## 2023-03-04 NOTE — Telephone Encounter (Signed)
Wife calling in to cancel patient procedure on 4/3. He is in the hospital at [facility]. Please advise

## 2023-03-05 DIAGNOSIS — Z951 Presence of aortocoronary bypass graft: Secondary | ICD-10-CM | POA: Diagnosis not present

## 2023-03-05 DIAGNOSIS — K559 Vascular disorder of intestine, unspecified: Secondary | ICD-10-CM | POA: Diagnosis not present

## 2023-03-05 DIAGNOSIS — K551 Chronic vascular disorders of intestine: Secondary | ICD-10-CM | POA: Diagnosis not present

## 2023-03-05 DIAGNOSIS — E876 Hypokalemia: Secondary | ICD-10-CM | POA: Diagnosis not present

## 2023-03-05 DIAGNOSIS — I480 Paroxysmal atrial fibrillation: Secondary | ICD-10-CM | POA: Diagnosis not present

## 2023-03-05 DIAGNOSIS — I2510 Atherosclerotic heart disease of native coronary artery without angina pectoris: Secondary | ICD-10-CM | POA: Diagnosis not present

## 2023-03-05 DIAGNOSIS — Z7901 Long term (current) use of anticoagulants: Secondary | ICD-10-CM | POA: Diagnosis not present

## 2023-03-06 ENCOUNTER — Ambulatory Visit (HOSPITAL_COMMUNITY)
Admission: RE | Admit: 2023-03-06 | Payer: Federal, State, Local not specified - PPO | Source: Home / Self Care | Admitting: Cardiology

## 2023-03-06 ENCOUNTER — Encounter (HOSPITAL_COMMUNITY): Admission: RE | Payer: Self-pay | Source: Home / Self Care

## 2023-03-06 DIAGNOSIS — R197 Diarrhea, unspecified: Secondary | ICD-10-CM | POA: Diagnosis not present

## 2023-03-06 DIAGNOSIS — K559 Vascular disorder of intestine, unspecified: Secondary | ICD-10-CM | POA: Diagnosis not present

## 2023-03-06 DIAGNOSIS — R1030 Lower abdominal pain, unspecified: Secondary | ICD-10-CM | POA: Diagnosis not present

## 2023-03-06 DIAGNOSIS — I480 Paroxysmal atrial fibrillation: Secondary | ICD-10-CM | POA: Diagnosis not present

## 2023-03-06 DIAGNOSIS — E876 Hypokalemia: Secondary | ICD-10-CM | POA: Diagnosis not present

## 2023-03-06 SURGERY — CARDIOVERSION
Anesthesia: General

## 2023-03-07 DIAGNOSIS — K551 Chronic vascular disorders of intestine: Secondary | ICD-10-CM | POA: Diagnosis not present

## 2023-03-07 DIAGNOSIS — R918 Other nonspecific abnormal finding of lung field: Secondary | ICD-10-CM | POA: Diagnosis not present

## 2023-03-07 DIAGNOSIS — I517 Cardiomegaly: Secondary | ICD-10-CM | POA: Diagnosis not present

## 2023-03-07 DIAGNOSIS — I771 Stricture of artery: Secondary | ICD-10-CM | POA: Diagnosis not present

## 2023-03-08 DIAGNOSIS — R197 Diarrhea, unspecified: Secondary | ICD-10-CM | POA: Diagnosis not present

## 2023-03-08 DIAGNOSIS — R1030 Lower abdominal pain, unspecified: Secondary | ICD-10-CM | POA: Diagnosis not present

## 2023-03-08 DIAGNOSIS — I4891 Unspecified atrial fibrillation: Secondary | ICD-10-CM | POA: Diagnosis not present

## 2023-03-09 DIAGNOSIS — R197 Diarrhea, unspecified: Secondary | ICD-10-CM | POA: Diagnosis not present

## 2023-03-10 DIAGNOSIS — R197 Diarrhea, unspecified: Secondary | ICD-10-CM | POA: Diagnosis not present

## 2023-03-10 DIAGNOSIS — J449 Chronic obstructive pulmonary disease, unspecified: Secondary | ICD-10-CM | POA: Diagnosis not present

## 2023-03-10 DIAGNOSIS — I517 Cardiomegaly: Secondary | ICD-10-CM | POA: Diagnosis not present

## 2023-03-10 DIAGNOSIS — J849 Interstitial pulmonary disease, unspecified: Secondary | ICD-10-CM | POA: Diagnosis not present

## 2023-03-10 DIAGNOSIS — I050 Rheumatic mitral stenosis: Secondary | ICD-10-CM | POA: Diagnosis not present

## 2023-03-11 ENCOUNTER — Ambulatory Visit: Payer: Federal, State, Local not specified - PPO | Admitting: Internal Medicine

## 2023-03-13 DIAGNOSIS — K559 Vascular disorder of intestine, unspecified: Secondary | ICD-10-CM | POA: Diagnosis not present

## 2023-03-13 DIAGNOSIS — E1165 Type 2 diabetes mellitus with hyperglycemia: Secondary | ICD-10-CM | POA: Diagnosis not present

## 2023-03-13 DIAGNOSIS — I5022 Chronic systolic (congestive) heart failure: Secondary | ICD-10-CM | POA: Diagnosis not present

## 2023-03-13 DIAGNOSIS — I10 Essential (primary) hypertension: Secondary | ICD-10-CM | POA: Diagnosis not present

## 2023-03-13 DIAGNOSIS — Z79899 Other long term (current) drug therapy: Secondary | ICD-10-CM | POA: Diagnosis not present

## 2023-03-13 DIAGNOSIS — J9611 Chronic respiratory failure with hypoxia: Secondary | ICD-10-CM | POA: Diagnosis not present

## 2023-03-15 ENCOUNTER — Ambulatory Visit: Payer: Federal, State, Local not specified - PPO | Admitting: Gastroenterology

## 2023-03-16 DIAGNOSIS — K5731 Diverticulosis of large intestine without perforation or abscess with bleeding: Secondary | ICD-10-CM | POA: Diagnosis not present

## 2023-03-16 DIAGNOSIS — D6832 Hemorrhagic disorder due to extrinsic circulating anticoagulants: Secondary | ICD-10-CM | POA: Diagnosis not present

## 2023-03-16 DIAGNOSIS — K922 Gastrointestinal hemorrhage, unspecified: Secondary | ICD-10-CM | POA: Diagnosis not present

## 2023-03-16 DIAGNOSIS — E119 Type 2 diabetes mellitus without complications: Secondary | ICD-10-CM | POA: Diagnosis not present

## 2023-03-16 DIAGNOSIS — I050 Rheumatic mitral stenosis: Secondary | ICD-10-CM | POA: Diagnosis not present

## 2023-03-16 DIAGNOSIS — I5032 Chronic diastolic (congestive) heart failure: Secondary | ICD-10-CM | POA: Diagnosis not present

## 2023-03-16 DIAGNOSIS — R578 Other shock: Secondary | ICD-10-CM | POA: Diagnosis not present

## 2023-03-16 DIAGNOSIS — D62 Acute posthemorrhagic anemia: Secondary | ICD-10-CM | POA: Diagnosis not present

## 2023-03-16 DIAGNOSIS — E669 Obesity, unspecified: Secondary | ICD-10-CM | POA: Diagnosis not present

## 2023-03-16 DIAGNOSIS — I2510 Atherosclerotic heart disease of native coronary artery without angina pectoris: Secondary | ICD-10-CM | POA: Diagnosis not present

## 2023-03-16 DIAGNOSIS — J449 Chronic obstructive pulmonary disease, unspecified: Secondary | ICD-10-CM | POA: Diagnosis not present

## 2023-03-16 DIAGNOSIS — I959 Hypotension, unspecified: Secondary | ICD-10-CM | POA: Diagnosis not present

## 2023-03-16 DIAGNOSIS — J9621 Acute and chronic respiratory failure with hypoxia: Secondary | ICD-10-CM | POA: Diagnosis not present

## 2023-03-16 DIAGNOSIS — I110 Hypertensive heart disease with heart failure: Secondary | ICD-10-CM | POA: Diagnosis not present

## 2023-03-16 DIAGNOSIS — E441 Mild protein-calorie malnutrition: Secondary | ICD-10-CM | POA: Diagnosis not present

## 2023-03-16 DIAGNOSIS — K810 Acute cholecystitis: Secondary | ICD-10-CM | POA: Diagnosis not present

## 2023-03-16 DIAGNOSIS — E039 Hypothyroidism, unspecified: Secondary | ICD-10-CM | POA: Diagnosis not present

## 2023-03-16 DIAGNOSIS — K551 Chronic vascular disorders of intestine: Secondary | ICD-10-CM | POA: Diagnosis not present

## 2023-03-16 DIAGNOSIS — K921 Melena: Secondary | ICD-10-CM | POA: Diagnosis not present

## 2023-03-17 DIAGNOSIS — I2510 Atherosclerotic heart disease of native coronary artery without angina pectoris: Secondary | ICD-10-CM | POA: Diagnosis not present

## 2023-03-17 DIAGNOSIS — K2270 Barrett's esophagus without dysplasia: Secondary | ICD-10-CM | POA: Diagnosis not present

## 2023-03-17 DIAGNOSIS — D62 Acute posthemorrhagic anemia: Secondary | ICD-10-CM | POA: Diagnosis not present

## 2023-03-17 DIAGNOSIS — K922 Gastrointestinal hemorrhage, unspecified: Secondary | ICD-10-CM | POA: Diagnosis not present

## 2023-03-17 DIAGNOSIS — K921 Melena: Secondary | ICD-10-CM | POA: Diagnosis not present

## 2023-03-17 DIAGNOSIS — Z7902 Long term (current) use of antithrombotics/antiplatelets: Secondary | ICD-10-CM | POA: Diagnosis not present

## 2023-03-17 DIAGNOSIS — I959 Hypotension, unspecified: Secondary | ICD-10-CM | POA: Diagnosis not present

## 2023-03-17 DIAGNOSIS — K559 Vascular disorder of intestine, unspecified: Secondary | ICD-10-CM | POA: Diagnosis not present

## 2023-03-17 DIAGNOSIS — K449 Diaphragmatic hernia without obstruction or gangrene: Secondary | ICD-10-CM | POA: Diagnosis not present

## 2023-03-17 DIAGNOSIS — K810 Acute cholecystitis: Secondary | ICD-10-CM | POA: Diagnosis not present

## 2023-03-18 DIAGNOSIS — K921 Melena: Secondary | ICD-10-CM | POA: Diagnosis not present

## 2023-03-18 DIAGNOSIS — K922 Gastrointestinal hemorrhage, unspecified: Secondary | ICD-10-CM | POA: Diagnosis not present

## 2023-03-18 DIAGNOSIS — K5730 Diverticulosis of large intestine without perforation or abscess without bleeding: Secondary | ICD-10-CM | POA: Diagnosis not present

## 2023-03-19 DIAGNOSIS — R1032 Left lower quadrant pain: Secondary | ICD-10-CM | POA: Diagnosis not present

## 2023-03-19 DIAGNOSIS — K922 Gastrointestinal hemorrhage, unspecified: Secondary | ICD-10-CM | POA: Diagnosis not present

## 2023-03-19 DIAGNOSIS — K921 Melena: Secondary | ICD-10-CM | POA: Diagnosis not present

## 2023-03-20 DIAGNOSIS — K922 Gastrointestinal hemorrhage, unspecified: Secondary | ICD-10-CM | POA: Diagnosis not present

## 2023-03-20 DIAGNOSIS — K921 Melena: Secondary | ICD-10-CM | POA: Diagnosis not present

## 2023-03-21 DIAGNOSIS — K922 Gastrointestinal hemorrhage, unspecified: Secondary | ICD-10-CM | POA: Diagnosis not present

## 2023-03-21 DIAGNOSIS — K921 Melena: Secondary | ICD-10-CM | POA: Diagnosis not present

## 2023-03-21 DIAGNOSIS — K5731 Diverticulosis of large intestine without perforation or abscess with bleeding: Secondary | ICD-10-CM | POA: Diagnosis not present

## 2023-03-21 DIAGNOSIS — K5730 Diverticulosis of large intestine without perforation or abscess without bleeding: Secondary | ICD-10-CM | POA: Diagnosis not present

## 2023-03-22 DIAGNOSIS — K559 Vascular disorder of intestine, unspecified: Secondary | ICD-10-CM | POA: Diagnosis not present

## 2023-03-22 DIAGNOSIS — K922 Gastrointestinal hemorrhage, unspecified: Secondary | ICD-10-CM | POA: Diagnosis not present

## 2023-03-22 DIAGNOSIS — I480 Paroxysmal atrial fibrillation: Secondary | ICD-10-CM | POA: Diagnosis not present

## 2023-03-22 DIAGNOSIS — R579 Shock, unspecified: Secondary | ICD-10-CM | POA: Diagnosis not present

## 2023-03-23 DIAGNOSIS — K922 Gastrointestinal hemorrhage, unspecified: Secondary | ICD-10-CM | POA: Diagnosis not present

## 2023-03-24 DIAGNOSIS — K922 Gastrointestinal hemorrhage, unspecified: Secondary | ICD-10-CM | POA: Diagnosis not present

## 2023-03-27 DIAGNOSIS — R5381 Other malaise: Secondary | ICD-10-CM | POA: Diagnosis not present

## 2023-03-27 DIAGNOSIS — K551 Chronic vascular disorders of intestine: Secondary | ICD-10-CM | POA: Diagnosis not present

## 2023-03-27 DIAGNOSIS — K5731 Diverticulosis of large intestine without perforation or abscess with bleeding: Secondary | ICD-10-CM | POA: Diagnosis not present

## 2023-03-28 ENCOUNTER — Encounter: Payer: Self-pay | Admitting: Internal Medicine

## 2023-03-28 ENCOUNTER — Ambulatory Visit: Payer: Federal, State, Local not specified - PPO | Attending: Internal Medicine | Admitting: Internal Medicine

## 2023-03-28 VITALS — BP 124/52 | HR 80 | Ht 68.0 in | Wt 185.0 lb

## 2023-03-28 DIAGNOSIS — I350 Nonrheumatic aortic (valve) stenosis: Secondary | ICD-10-CM | POA: Diagnosis not present

## 2023-03-28 DIAGNOSIS — I342 Nonrheumatic mitral (valve) stenosis: Secondary | ICD-10-CM | POA: Diagnosis not present

## 2023-03-28 DIAGNOSIS — K559 Vascular disorder of intestine, unspecified: Secondary | ICD-10-CM

## 2023-03-28 DIAGNOSIS — I4891 Unspecified atrial fibrillation: Secondary | ICD-10-CM | POA: Diagnosis not present

## 2023-03-28 DIAGNOSIS — Z951 Presence of aortocoronary bypass graft: Secondary | ICD-10-CM | POA: Diagnosis not present

## 2023-03-28 NOTE — Progress Notes (Addendum)
OFFICE NOTE  Chief Complaint:  Follow-up hospitalization  Primary Care Physician: [name], MD  HPI:  Guadalupe Nickless is a 77 y.o. 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 He is originally from [place] and his wife who is accompanying him today is originally from [place]. Mr. Adelsberger had an MI in 2006 and underwent coronary artery bypass grafting with a LIMA to LAD, SVG to OM1, and SVG to RPDA. He did fairly well with this however 2015 was having chest discomfort. He underwent a nuclear stress test which showed possible reversible ischemia. Therefore he was referred for cardiac catheterization at the [facility] [place]. That demonstrated all 3 patent bypass grafts with multivessel coronary disease. At that time he was also having episodes of palpitations and a monitor demonstrated PSVT. He was scheduled for possible ablation but is had chronic problems with an ankle fracture and nonhealing wound. He therefore never underwent ablation. He was placed on digoxin and has been on metoprolol but stopped the digoxin at some point in the past. He reports over the past 8-12 months she's had no further palpitations. He also has a history of dyslipidemia, hypothyroidism, and borderline diabetes. Recently he had a sleep study through [facility] physicians which demonstrated severe obstructive sleep apnea and BiPAP was recommended with settings of 19/14 cm water pressure. Currently he denies any chest pain or worsening shortness of breath. His last echocardiogram from his cardiologist in [place] was a 2015 which showed an EF of 58% and no significant valvular disease.  07/26/2016  Mr. Heggs returns today for follow-up. He was recently seen in the emergency department in the beginning of June for chest pain. This was associated with tachycardia and probable SVT. Troponin was noted to be elevated to 0.12. He had signs and symptoms concerning for unstable angina. He was  evaluated by my partners and felt to need cardiac catheterization. Eventually he underwent cardiac catheterization by Dr. Katrinka Blazing with the results as follows:  Conclusion   LM lesion, 85% stenosed. Ost 1st Mrg lesion, 100% stenosed. SVG was injected . Ost RCA to Prox RCA lesion, 100% stenosed. SVG . Origin to Prox Graft lesion, 40% stenosed. Mid RCA to Dist RCA lesion, 100% stenosed. Prox LAD lesion, 100% stenosed. LIMA .   Patent saphenous vein grafts. SVG to PDA contains eccentric 40% proximal stenosis. SVG to the circumflex is widely patent. LIMA to the LAD is widely patent. Native distal left main contains 75% stenosis. The dominant obtuse marginal branch is totally occluded. The native right coronary is totally occluded. Left ventriculogram demonstrates inferior wall hypokinesis. EF 50%. Recent chest pain in the setting of PSVT with minimal enzyme elevation likely related to demand ischemia in the setting of underlying native coronary disease.   Recommendations: Medical therapy. Management of PSVT with medications versus ablation.   Since discharge she denies any further episodes of tachycardia palpitations. He is already on a high dose of beta blocker. He's had a small amount of weight gain but has stopped smoking.  He plans to start to do more exercise and work on weight loss. I'm concerned about his recurrent palpitations and the fact that asymptomatic and has demand ischemia related to them and bypass graft insufficiency.  06/10/2017  Mr. Swager returns today for follow-up. It's been almost a year since I last saw him. He was found to have recurrent symptomatic SVT and underwent comprehensive EP study and was diagnosed with classic AV nodal reentry tachycardia  and subsequently underwent selective radiofrequency ablation by Dr. Elberta Fortis. He says since that time he's had no further episodes of tachypalpitations. Since then he was hospitalized twice, after developing C. difficile colitis  and enteritis with GI bleed in January. Subsequently he developed acute congestive heart failure was found to have a mild reduction in LVEF to 45-50% by echo in March 2018. At that time he was on furosemide 40 mg daily. He had a follow-up with Azalee Course, PA-C in April who noted he was on Lasix 40 mg twice daily, however he reported that he was not taking that to me today. Fact he says he is now off of Lasix. He reports worsening shortness of breath and hasn't fact had a 13 pound weight gain since April. He denies any lower extremity edema. He has been working with Dr. Sherene Sires for his shortness of breath.  07/22/2017  Mr. Russey is back today for follow-up. He restarted his Lasix at my direction his weight is now down from 252-244. He's had significant improvement in swelling and reports some improvement in his breathing however he says it still not good. He does have a mildly reduced EF of 45-50% but also has some chronic lung disease which is likely contributing. He says he gets short of breath while doing certain activities in the yard and then comes inside and uses his oxygen which improves him almost immediately. He denies any orthopnea. He has not had any recent or productive cough. He has a follow-up visit with his pulmonologist tomorrow. I had ordered lab work including a metabolic profile and BNP which was never obtained.  09/09/2018  Mr. Janowiak is seen today in follow-up.  He reports some improvement in his shortness of breath.  He has joined a gym and now is exercising every day.  Weight however has been stable at 252, actually up 2 pounds from April.  Despite this his breathing has improved somewhat.  He still uses oxygen.  He did have one episode recently where he became nauseated and vomited.  He was up at the [employer] [place] and had been taking antibiotics for a dental infection.  He did undergo CT scan of the head and neck and that demonstrated bilateral carotid artery calcification.  This is not  surprising given his history of coronary artery disease and prior CABG however he has not had prior carotid Dopplers.    02/18/2019  Mr. Gladson is seen today in follow-up.  Recently he says has had some improvement in shortness of breath.  Dr. Sherene Sires, his pulmonologist had started him on a new nebulizer called revefenacin.  His most recent hemoglobin A1c was 7.1.  Weight is down about 4 pounds.  He denies any new chest pain.  He has had some recent anemia and work-up is apparently underway for this.  He is tentatively scheduled for an endoscopy on April 16 with Dr. Marca Ancona.  He is concerned because of his pulmonary status that he may be at high risk of decompensation with the procedure.  He also has follow-up with his PCP in the near future.  09/14/2019  Mr. Colpitts returns today for follow-up.  He continues to do well.  His shortness of breath has improved which I think was both believed due to a combination of pulmonary optimization as well as improvement in LV function.  Recently his echo in fact showed LVEF has normalized to 60 to 65%.  He remains euvolemic and is actually 2 pounds lighter than he was seen about  8 months ago.  Hemoglobin A1c has been reasonably well-controlled however his lipids are still uncontrolled.  His total cholesterol is 211, triglycerides 494, HDL 32 and LDL of 114.  His target LDL is less than 70 and his elevated triglycerides also put him at increased cardiovascular risk.  03/02/2020  Mr. Helbert is seen today in follow-up.  He denies any chest pain but does have some shortness of breath.  Is been some weight gain.  He reports less physical activity has been struggling with a long recovery after undergoing circumcision for issues with phimosis.  Its unfortunate that he had a difficult recovery but seems to be doing better.  Blood pressure was well controlled today.  His EKG demonstrated some worsening inferior and lateral T wave inversions.  This was previously seen however not as  significant.  His last heart cath was in 2017 which did show a patent SVG to OM and occluded SVG to right coronary.  He is anticipating restarting an exercise program and working with a Clinical research associate.  02/24/2021  Mr. Kushner is seen today in follow-up.  Overall he says he is feeling reasonably well.  He has had some shortness of breath.  He is scheduled for a sleep study.  We discussed this because of the previously mentioned issues and that is actually scheduled tonight.  He also has a history of heart failure with diabetes which is not at target A1c of 8.3.  Lipids recently at assess her total cholesterol 106, HDL 33, LDL 49 and triglycerides 134.  07/18/2021  Mr. Nanna is seen today for routine follow-up.  He seems to be doing well on the Jardiance at 25 mg daily.  Hemoglobin A1c is now come down to 7.3.  Blood pressure is well controlled.  EKG is unchanged showing sinus rhythm with some lateral T wave changes.  He has noted some lower blood pressures at home.  He says around the mid 90s over 41s.  Blood pressure here was elevated 130/90 however more typical readings are between 90 and 631 systolic.  He says he is asymptomatic for this.  He is on very little medicine.  1 option would be to consider backing off on the bisoprolol.  Cholesterol is very well controlled as of February total 106, HDL 33, LDL 49 triglycerides 134.  I do not believe it is "too low".  Would recommend continue his current regimen.  02/02/2022  Mr. Nesmith is seen today in follow-up.  Overall he says he is doing very well.  Weight has been fairly stable.  Blood pressure is well controlled.  He has been undergoing some rehab for a frozen shoulder.  He denies any worsening shortness of breath.  Recent labs showed total cholesterol 153, HDL 33 triglycerides 291 and LDL 73.  A1c 7.3%.  EKG shows a sinus rhythm with some inferolateral T wave changes (unchanged from prior EKG in 2022).  09/07/2022  Ilya is seen today in follow-up.  Overall  he seems to be doing pretty well.  He is actually lost quite a bit of weight recently.  He is down to 215 pounds but was as high as 245 back in March of this past year.  He has made some dietary changes.  He is also had some issues with his GI system and is having an upcoming endoscopy.  Blood pressure is well controlled.  He denies any chest pain.  He did have a recent syncopal episode.  He said he was working on his house  and had been not sleeping well and thought he was dehydrated and simply passed out.  It does not sound like there was a clear prodrome prior to that but if he feels it was likely related to exhaustion.  He has never passed out before.  He has not had any subsequent episodes.  12/27/2022  Hermen returns today for follow-up.  Recently he is followed up with Dr. Sherene Sires.  Has been having issues with reflux and had EGD and colonoscopy.  He was noted to have I believe 24 lesions of the esophagus, likely Barrett's esophagus.  He has had vomiting and is scheduled for a gastric emptying study.  Despite this, he is also made significant dietary changes and reports in the past 3 months he is lost about 35 pounds.  It was noted that he is markedly hypotensive today with blood pressure 82/46.  He was also seen recently as mentioned with Dr. Sherene Sires and had a CT scan of the chest.  Besides emphysema this demonstrated aortic valve calcification as well as pulmonary artery dilatation suggestive of pulmonary hypertension.  He has been short of breath but uses home oxygen.  He also tells me that he has an upcoming visit for periodontal surgery with Dr. Jeanice Lim.  02/18/2023  Roan is seen today as an add-on visit.  Unfortunately he was just hospitalized over the past week with dehydration, nausea vomiting and diarrhea as well as abdominal pain.  He was seen in [facility] ER and stabilized and then presented to his PCP was sent to [facility]  There he was admitted and found to have norovirus gastroenteritis in addition to what  was described as an ischemic colitis.  His medications were held including his diuretics and blood pressure medicines to allow higher blood pressure.  I had recently cut back on his blood pressure medicine in January, stopping his losartan.  Since discharge she has had some persistent diarrhea.  He was noted to be somewhat anemic with a hemoglobin of 11.4.  His echo was read by myself and surprisingly indicated that he has moderate to severe mitral stenosis with elevated left atrial pressure.  This is likely contributing to some of his symptoms of shortness of breath.  Today he says he feels quite fatigued and is very symptomatic.  He is lost a significant amount of weight down another 15 pounds since I saw him in January.  In addition to the mitral stenosis, his EKG today shows atrial fibrillation.  This is a new diagnosis for him.  It does not appear that he had an EKG at [facility] that I could find but did have an EKG at [facility] which showed sinus rhythm with marked sinus arrhythmia although I would say it is questionably A-fib.  03/28/2023  Jaxan returns today for follow-up.  Unfortunately he has been hospitalized at least twice since I last saw him at [facility].  He has had issues with recurrent diarrhea and then unfortunately GI bleeding.  He was found to have mesenteric ischemia and underwent left brachial artery cutdown and mesenteric artery and celiac artery stenting.  Fortunately, he was noted to be in sinus rhythm when he presented although we had been working him up for possible TEE cardioversion.  Ultimately cardiology was consulted and they recommended a TEE to further evaluate his mitral valve disease.  Eliquis was held but after stenting ultimately aspirin and Eliquis were restarted.  He is TEE demonstrated normal LVEF with heavily calcified mitral valve that was thought to be degenerative  with mild MR and a mean gradient of 5.8 at 65 bpm consistent with severe mitral stenosis with possibly low gradient.   There was also diffuse calcification of the aortic valve which planimeter to 1.2 cm with a peak velocity of 3.2 m/s thought to be moderately stenotic.  He required 8 units of packed red blood cells and after discharge remained anemic with a hemoglobin of 7.8.  He remains on oxygen and was recently started on iron.  We discussed today about possible management options.  I do feel that mitral stenosis is likely a major risk factor for worsening shortness of breath as well as development of recurrent atrial fibrillation and stroke.  They had discussed the possibility of a Watchman device with him however with his peripheral arterial disease and mitral stenosis I think this would be a suboptimal procedure.  Ideally 1 would consider mitral valve replacement surgery with left atrial appendage closure surgically, and possibly consider aortic valve intervention.  However his comorbidities, recent GI bleeding and mesenteric ischemia may make him a prohibitive surgical candidate.  Additionally he has had prior CABG and this would be a redo sternotomy.  PMHx:  Past Medical History:  Diagnosis Date   A-fib    Anemia    Aortic valve sclerosis 12/2018   Noted on ECHO   Arthritis    CAD (coronary artery disease)    a. 2006: CABG in 2006 with LIMA-LAD, SVG-OM1, and SVG-RPDA   Cardiomegaly 12/2018   Stable, noted on CXR   Carpal tunnel syndrome    Right   Chronic pain 03/21/2016   Colon cancer 2006   COPD (chronic obstructive pulmonary disease)    Diabetes mellitus without complication    DVT (deep venous thrombosis)    Right   Essential hypertension 03/21/2016   GERD (gastroesophageal reflux disease)    History of blood transfusion    History of Clostridioides difficile infection    History of prostate cancer 03/21/2016   History of PSVT (paroxysmal supraventricular tachycardia)    HLD (hyperlipidemia)    HTN (hypertension)    Hx of CABG 2006   Hypercholesteremia 03/21/2016   Hypothyroidism    LAE  (left atrial enlargement) 12/2018   Severe, Noted on ECHO   LVH (left ventricular hypertrophy) 12/2018   Mild, Noted on ECHO   Medication management 03/21/2016   Mitral annular calcification 12/2018   with mild MS, Noted on ECHO   Morbid obesity 03/21/2016   Myocardial infarct    OSA (obstructive sleep apnea) 03/21/2016   uses oxygen at night time   Pain in right ankle and joints of right foot 03/21/2016   Paronychia 03/21/2016   Phimosis    Pneumonia    Primary insomnia 03/21/2016   Prostate cancer 2008   Pulmonary hypertension 12/2018   Moderate, Noted on ECHO   Tobacco dependence 03/21/2016   Tricuspid regurgitation 12/2018   Mild, Noted on ECHO    Past Surgical History:  Procedure Laterality Date   ANKLE SURGERY Right 12/2013   CARDIAC CATHETERIZATION N/A 05/08/2016   Procedure: Left Heart Cath and Cors/Grafts Angiography;  Surgeon: Lyn Records, MD;  Location: [facility] INVASIVE CV LAB;  Service: Cardiovascular;  Laterality: N/A;   CIRCUMCISION N/A 10/07/2019   Procedure: CIRCUMCISION ADULT;  Surgeon: Crist Fat, MD;  Location: [facility] ORS;  Service: Urology;  Laterality: N/A;   CORONARY ARTERY BYPASS GRAFT  2006   x3   ELECTROPHYSIOLOGIC STUDY N/A 08/24/2016   Procedure: SVT Ablation;  Surgeon: Will  Jorja Loa, MD;  Location: [facility] INVASIVE CV LAB;  Service: Cardiovascular;  Laterality: N/A;   PROSTATE SURGERY  2008   PTCA     UMBILICAL HERNIA REPAIR N/A 05/26/2022   Procedure: PRIMARY REPAIR OF STRANGULATED UMBILICAL HERNIA WITH PARTIAL OMENTECTOMY;  Surgeon: Almond Lint, MD;  Location: [facility] OR;  Service: General;  Laterality: N/A;    FAMHx:  Family History  Problem Relation Age of Onset   Dementia Mother    Diabetes Sister     SOCHx:   reports that he quit smoking about 6 years ago. His smoking use included cigarettes. He has a 45.00 pack-year smoking history. He has never used smokeless tobacco. He reports that he does not currently use alcohol. He reports that he does  not use drugs.  ALLERGIES:  Allergies  Allergen Reactions   Shellfish Allergy Shortness Of Breath, Nausea And Vomiting and Rash    scallops     Fluzone [Influenza Virus Vaccine] Nausea And Vomiting, Palpitations and Rash    ROS: Pertinent items noted in HPI and remainder of comprehensive ROS otherwise negative.  HOME MEDS: Current Outpatient Medications  Medication Sig Dispense Refill   apixaban (ELIQUIS) 5 MG TABS tablet Take 1 tablet (5 mg total) by mouth 2 (two) times daily. 60 tablet 11   atorvastatin (LIPITOR) 80 MG tablet Take 80 mg by mouth daily.     colchicine 0.6 MG tablet Take 0.6 mg by mouth daily as needed (Gout).     dexlansoprazole (DEXILANT) 60 MG capsule Take 60 mg by mouth daily.     dicyclomine (BENTYL) 10 MG capsule Take 10 mg by mouth 3 (three) times daily before meals.     ezetimibe (ZETIA) 10 MG tablet TAKE 1 TABLET(10 MG) BY MOUTH DAILY 90 tablet 2   ferrous sulfate (FEROSUL) 325 (65 FE) MG tablet TAKE 1 TABLET BY MOUTH DAILY WITH BREAKFAST (Patient taking differently: Take 325 mg by mouth daily with breakfast.) 90 tablet 3   fiber (NUTRISOURCE FIBER) PACK packet Take 1 packet by mouth 2 (two) times daily.     formoterol (PERFOROMIST) 20 MCG/2ML nebulizer solution Take 40 mcg by nebulization daily.     glimepiride (AMARYL) 4 MG tablet Take 4 mg by mouth daily with breakfast.     levothyroxine (SYNTHROID, LEVOTHROID) 88 MCG tablet Take 88 mcg by mouth daily before breakfast.     metoCLOPramide (REGLAN) 5 MG tablet Take 5 mg by mouth 2 (two) times daily before a meal.     OXYGEN Inhale 4 L into the lungs at bedtime. 4 lpm with 24/7 AHC     revefenacin (YUPELRI) 175 MCG/3ML nebulizer solution Inhale 3 mLs (175 mcg total) into the lungs every morning. 90 mL 11   No current facility-administered medications for this visit.    LABS/IMAGING: No results found for this or any previous visit (from the past 48 hour(s)). No results found.  WEIGHTS: Wt Readings  from Last 3 Encounters:  03/28/23 185 lb (83.9 kg)  02/18/23 194 lb (88 kg)  02/05/23 198 lb (89.8 kg)    VITALS: BP (!) 124/52   Pulse 80   Ht  (1.727 m)   Wt 185 lb (83.9 kg)   SpO2 97%   BMI 28.13 kg/m   EXAM: General appearance: alert, no distress, and pale Neck: no carotid bruit, no JVD, and thyroid not enlarged, symmetric, no tenderness/mass/nodules Lungs: diminished breath sounds bilaterally Heart: irregularly irregular rhythm, S1, S2 normal, and systolic murmur: holosystolic 3/6, blowing at  2nd right intercostal space Abdomen: Soft, mild tenderness to palpation Extremities: extremities normal, atraumatic, no cyanosis or edema Pulses: 2+ and symmetric Skin: Pale, cool, dry Neurologic: Grossly normal Psych: Pleasant  EKG: Normal sinus rhythm, nonspecific T wave changes at 80-personally reviewed  ASSESSMENT: Newly recognized atrial fibrillation -CHA2DS2-VASc score of 4 Moderate aortic stenosis and severe mitral stenosis Ischemic colitis with recent GI bleeding Recent SVT which was symptomatic-repeat cardiac catheterization (05/2016) shows patent grafts, status post ablation of AV nodal reentry tachycardia (08/2016) CAD status post three-vessel CABG in 2006 (LIMA to LAD, SVG to OM1, SVG to RPDA) in [place] Patent bypass grafts by cath in 2015 Dyslipidemia on Lipitor OSA-BiPAP recommended COPD Chronic systolic congestive heart failure-LVEF 45-50% -improved to 60 to 65% (12/2018)  Phimosis Syncope  PLAN: 1.   Mr. Shaneyfelt unfortunately had GI bleeding related to mesenteric ischemia.  He underwent stenting of the celiac and superior mesenteric arteries and is now on aspirin and Eliquis.  It seems that his GI bleeding has stopped for the time being.  He remains anemic and is on iron.  His TEE demonstrates probable severe mitral stenosis with likely moderate aortic stenosis.  He had paroxysmal atrial fibrillation but fortunately converted to sinus rhythm.  EKG today  shows sinus rhythm.  Given the fact that he has valvular heart disease that is likely contributing to his symptoms of shortness of breath and fatigue, I think we need to consider addressing this issue.  He has had prior sternotomy and seems to be a less than ideal candidate for redo surgery however might benefit from mitral valve replacement with left atrial appendage closure.  I would like to get an opinion from our multidisciplinary valve clinic as to what his options may be.  For now would recommend continuing aspirin and Eliquis and monitoring closely for recurrent bleeding.  Follow-up with me in 3 months.  Chrystie Nose, MD, [facility], FACP  [place]  [facility]  Medical Director of the Advanced Lipid Disorders &  Cardiovascular Risk Reduction Clinic Diplomate of the American Board of Clinical Lipidology Attending Cardiologist  Direct Dial: [phone]  Fax: [phone]  Website:  [email or web address] Nicely Kellen Hover 03/28/2023, 1:44 PM

## 2023-03-28 NOTE — Patient Instructions (Signed)
Medication Instructions:  Your physician recommends that you continue on your current medications as directed. Please refer to the Current Medication list given to you today.  *If you need a refill on your cardiac medications before your next appointment, please call your pharmacy*  Follow-Up: At [facility], you and your health needs are our priority.  As part of our continuing mission to provide you with exceptional heart care, we have created designated Provider Care Teams.  These Care Teams include your primary Cardiologist (physician) and Advanced Practice Providers (APPs -  Physician Assistants and Nurse Practitioners) who all work together to provide you with the care you need, when you need it.  We recommend signing up for the patient portal called "MyChart".  Sign up information is provided on this After Visit Summary.  MyChart is used to connect with patients for Virtual Visits (Telemedicine).  Patients are able to view lab/test results, encounter notes, upcoming appointments, etc.  Non-urgent messages can be sent to your provider as well.   To learn more about what you can do with MyChart, go to [email or web address].    Your next appointment:   3 month(s)  Provider:   Chrystie Nose, MD     Other Instructions You have been referred to: Structural Heart/Valve Clinic

## 2023-04-01 NOTE — Addendum Note (Signed)
Addended by: Orlene Och on: 04/01/2023 09:47 AM   Modules accepted: Orders

## 2023-04-08 ENCOUNTER — Telehealth: Payer: Self-pay | Admitting: Internal Medicine

## 2023-04-08 NOTE — Telephone Encounter (Signed)
Spoke with patient and as he has appt Friday, will send message to provider to add on EKG at this appt.  Patient aware

## 2023-04-08 NOTE — Telephone Encounter (Signed)
Patient states increased HR last few days. States different times during the day, happens at rest or activity.  No Symptoms. "Little SOB" but uses oxygen or takes deep breaths and resolves. This is via pulse oximeter.  Just started last few days.  Takes 81 mg Aspirin daily. Not on chart Checked while on phone 89 Pulse and 96 O2 while on the phone. Concern is the higher HR, though last OV was 80.

## 2023-04-08 NOTE — Telephone Encounter (Signed)
STAT if HR is under 50 or over 120 (normal HR is 60-100 beats per minute)  What is your heart rate?   Do you have a log of your heart rate readings (document readings)?  Past few days HR has been running 80-100  100 HR, 90 02 90 HR, 92 O2 89 HR. 91 O2  Do you have any other symptoms?  No

## 2023-04-11 DIAGNOSIS — K625 Hemorrhage of anus and rectum: Secondary | ICD-10-CM | POA: Diagnosis not present

## 2023-04-11 DIAGNOSIS — I342 Nonrheumatic mitral (valve) stenosis: Secondary | ICD-10-CM | POA: Diagnosis not present

## 2023-04-11 DIAGNOSIS — I4891 Unspecified atrial fibrillation: Secondary | ICD-10-CM | POA: Diagnosis not present

## 2023-04-11 DIAGNOSIS — K551 Chronic vascular disorders of intestine: Secondary | ICD-10-CM | POA: Diagnosis not present

## 2023-04-12 ENCOUNTER — Ambulatory Visit: Payer: Federal, State, Local not specified - PPO | Admitting: Cardiovascular Disease

## 2023-04-12 DIAGNOSIS — I10 Essential (primary) hypertension: Secondary | ICD-10-CM | POA: Diagnosis not present

## 2023-04-12 DIAGNOSIS — R578 Other shock: Secondary | ICD-10-CM | POA: Diagnosis not present

## 2023-04-12 DIAGNOSIS — I21A1 Myocardial infarction type 2: Secondary | ICD-10-CM | POA: Diagnosis not present

## 2023-04-12 DIAGNOSIS — Z7901 Long term (current) use of anticoagulants: Secondary | ICD-10-CM | POA: Diagnosis not present

## 2023-04-12 DIAGNOSIS — R079 Chest pain, unspecified: Secondary | ICD-10-CM | POA: Diagnosis not present

## 2023-04-12 DIAGNOSIS — K922 Gastrointestinal hemorrhage, unspecified: Secondary | ICD-10-CM | POA: Diagnosis not present

## 2023-04-12 DIAGNOSIS — I480 Paroxysmal atrial fibrillation: Secondary | ICD-10-CM | POA: Diagnosis not present

## 2023-04-12 DIAGNOSIS — J449 Chronic obstructive pulmonary disease, unspecified: Secondary | ICD-10-CM | POA: Diagnosis not present

## 2023-04-12 DIAGNOSIS — R933 Abnormal findings on diagnostic imaging of other parts of digestive tract: Secondary | ICD-10-CM | POA: Diagnosis not present

## 2023-04-12 DIAGNOSIS — I050 Rheumatic mitral stenosis: Secondary | ICD-10-CM | POA: Diagnosis not present

## 2023-04-12 DIAGNOSIS — D62 Acute posthemorrhagic anemia: Secondary | ICD-10-CM | POA: Diagnosis not present

## 2023-04-12 DIAGNOSIS — I509 Heart failure, unspecified: Secondary | ICD-10-CM | POA: Diagnosis not present

## 2023-04-12 DIAGNOSIS — Z7982 Long term (current) use of aspirin: Secondary | ICD-10-CM | POA: Diagnosis not present

## 2023-04-12 DIAGNOSIS — K5731 Diverticulosis of large intestine without perforation or abscess with bleeding: Secondary | ICD-10-CM | POA: Diagnosis not present

## 2023-04-12 DIAGNOSIS — E119 Type 2 diabetes mellitus without complications: Secondary | ICD-10-CM | POA: Diagnosis not present

## 2023-04-12 DIAGNOSIS — K921 Melena: Secondary | ICD-10-CM | POA: Diagnosis not present

## 2023-04-12 DIAGNOSIS — D6489 Other specified anemias: Secondary | ICD-10-CM | POA: Diagnosis not present

## 2023-04-12 DIAGNOSIS — J90 Pleural effusion, not elsewhere classified: Secondary | ICD-10-CM | POA: Diagnosis not present

## 2023-04-12 DIAGNOSIS — I2510 Atherosclerotic heart disease of native coronary artery without angina pectoris: Secondary | ICD-10-CM | POA: Diagnosis not present

## 2023-04-12 DIAGNOSIS — E785 Hyperlipidemia, unspecified: Secondary | ICD-10-CM | POA: Diagnosis not present

## 2023-04-12 DIAGNOSIS — D649 Anemia, unspecified: Secondary | ICD-10-CM | POA: Diagnosis not present

## 2023-04-12 DIAGNOSIS — I272 Pulmonary hypertension, unspecified: Secondary | ICD-10-CM | POA: Diagnosis not present

## 2023-04-12 DIAGNOSIS — K551 Chronic vascular disorders of intestine: Secondary | ICD-10-CM | POA: Diagnosis not present

## 2023-04-12 DIAGNOSIS — E039 Hypothyroidism, unspecified: Secondary | ICD-10-CM | POA: Diagnosis not present

## 2023-04-13 DIAGNOSIS — I2489 Other forms of acute ischemic heart disease: Secondary | ICD-10-CM | POA: Diagnosis not present

## 2023-04-13 DIAGNOSIS — I083 Combined rheumatic disorders of mitral, aortic and tricuspid valves: Secondary | ICD-10-CM | POA: Diagnosis not present

## 2023-04-13 DIAGNOSIS — I272 Pulmonary hypertension, unspecified: Secondary | ICD-10-CM | POA: Diagnosis not present

## 2023-04-13 DIAGNOSIS — D6489 Other specified anemias: Secondary | ICD-10-CM | POA: Diagnosis not present

## 2023-04-13 DIAGNOSIS — K922 Gastrointestinal hemorrhage, unspecified: Secondary | ICD-10-CM | POA: Diagnosis not present

## 2023-04-14 DIAGNOSIS — D6489 Other specified anemias: Secondary | ICD-10-CM | POA: Diagnosis not present

## 2023-04-14 DIAGNOSIS — K921 Melena: Secondary | ICD-10-CM | POA: Diagnosis not present

## 2023-04-14 DIAGNOSIS — D649 Anemia, unspecified: Secondary | ICD-10-CM | POA: Diagnosis not present

## 2023-04-14 DIAGNOSIS — I491 Atrial premature depolarization: Secondary | ICD-10-CM | POA: Diagnosis not present

## 2023-04-14 DIAGNOSIS — I4581 Long QT syndrome: Secondary | ICD-10-CM | POA: Diagnosis not present

## 2023-04-15 NOTE — Progress Notes (Unsigned)
Patient ID: Jeremiah [name] MRN: [id number] DOB/AGE: [birth year] 76 y.o.  Primary Care Physician:Koirala, Dibas, MD Primary Cardiologist: Zoila Shutter, MD  FOCUSED CARDIOVASCULAR PROBLEM LIST:   1.  Severe mitral stenosis with mitral annular calcification with a mean mitral gradient of 10 mmHg 2.  Heavily calcified aortic valve without stenosis with mean gradient of 8 mmHg. 3.  Coronary artery disease status post CABG consisting of a LIMA to LAD, vein graft to obtuse marginal, and vein graft to PDA in 2006 4.  SVT status post ablation 2017 5.  Type 2 diabetes not on insulin 6.  Hyperlipidemia 7.  Paroxysmal atrial fibrillation on Eliquis: CV 2 score of 4 8.  Iron deficiency anemia and recent GI bleeding   HISTORY OF PRESENT ILLNESS: The patient is a 77 y.o. male with the indicated medical history here for recommendations regarding his severe mitral stenosis.  The patient saw [name] recently.  He had been hospitalized a few times at [facility]  He developed GI bleeding and was found to have developed ischemic colitis; he underwent mesenteric artery and celiac artery stenting.  Apparently he required 8 units of packed red blood cells.  He ultimately underwent a TEE with a mitral valve area, 1.3-1.7.  The mean gradient was 5.8 mmHg and this was thought to be suggestive of severe calcific mitral stenosis due to heavily calcified mitral valve by appearance.  The aortic valve area with planimetry to be 1.2 cm and the valve was severely calcified with restricted mobility; his Hgb was around 10 at this time.  He had a transthoracic echocardiogram on May 11 which demonstrated upper septal hypertrophy with a sigmoid septum, and ejection fraction was 65 to 70%, and a mean gradient across the mitral valve of 6.5 mmHg with a heart rate of 76 bpm (the patient's Hgb was ~5 at this time).  There was severe mitral annular calcification and moderate mitral regurgitation seen.  Interestingly Dr.  [name] had obtained an echocardiogram in February which demonstrated a mean mitral gradient of 10 mmHg.     Past Medical History:  Diagnosis Date   A-fib (HCC)    Anemia    Aortic valve sclerosis 12/2018   Noted on ECHO   Arthritis    CAD (coronary artery disease)    a. 2006: CABG in 2006 with LIMA-LAD, SVG-OM1, and SVG-RPDA   Cardiomegaly 12/2018   Stable, noted on CXR   Carpal tunnel syndrome    Right   Chronic pain 03/21/2016   Colon cancer (HCC) 2006   COPD (chronic obstructive pulmonary disease) (HCC)    Diabetes mellitus without complication (HCC)    DVT (deep venous thrombosis) (HCC)    Right   Essential hypertension 03/21/2016   GERD (gastroesophageal reflux disease)    History of blood transfusion    History of Clostridioides difficile infection    History of prostate cancer 03/21/2016   History of PSVT (paroxysmal supraventricular tachycardia)    HLD (hyperlipidemia)    HTN (hypertension)    Hx of CABG 2006   Hypercholesteremia 03/21/2016   Hypothyroidism    LAE (left atrial enlargement) 12/2018   Severe, Noted on ECHO   LVH (left ventricular hypertrophy) 12/2018   Mild, Noted on ECHO   Medication management 03/21/2016   Mitral annular calcification 12/2018   with mild MS, Noted on ECHO   Morbid obesity (HCC) 03/21/2016   Myocardial infarct (HCC)    OSA (obstructive sleep apnea) 03/21/2016   uses oxygen at night  time   Pain in right ankle and joints of right foot 03/21/2016   Paronychia 03/21/2016   Phimosis    Pneumonia    Primary insomnia 03/21/2016   Prostate cancer (HCC) 2008   Pulmonary hypertension (HCC) 12/2018   Moderate, Noted on ECHO   Tobacco dependence 03/21/2016   Tricuspid regurgitation 12/2018   Mild, Noted on ECHO    Past Surgical History:  Procedure Laterality Date   ANKLE SURGERY Right 12/2013   CARDIAC CATHETERIZATION N/A 05/08/2016   Procedure: Left Heart Cath and Cors/Grafts Angiography;  Surgeon: Lyn Records, MD;  Location: [facility] INVASIVE  CV LAB;  Service: Cardiovascular;  Laterality: N/A;   CIRCUMCISION N/A 10/07/2019   Procedure: CIRCUMCISION ADULT;  Surgeon: Crist Fat, MD;  Location: [facility] ORS;  Service: Urology;  Laterality: N/A;   CORONARY ARTERY BYPASS GRAFT  2006   x3   ELECTROPHYSIOLOGIC STUDY N/A 08/24/2016   Procedure: SVT Ablation;  Surgeon: Will Jorja Loa, MD;  Location: [facility] INVASIVE CV LAB;  Service: Cardiovascular;  Laterality: N/A;   PROSTATE SURGERY  2008   PTCA     UMBILICAL HERNIA REPAIR N/A 05/26/2022   Procedure: PRIMARY REPAIR OF STRANGULATED UMBILICAL HERNIA WITH PARTIAL OMENTECTOMY;  Surgeon: Almond Lint, MD;  Location: [facility] OR;  Service: General;  Laterality: N/A;    Family History  Problem Relation Age of Onset   Dementia Mother    Diabetes Sister     Social History   Socioeconomic History   Marital status: Married    Spouse name: Not on file   Number of children: 0   Years of education: Not on file   Highest education level: Not on file  Occupational History   Occupation: retired - direct [employer]  Tobacco Use   Smoking status: Former    Packs/day: 0.75    Years: 60.00    Additional pack years: 0.00    Total pack years: 45.00    Types: Cigarettes    Quit date: 12/15/2016    Years since quitting: 6.3   Smokeless tobacco: Never  Vaping Use   Vaping Use: Never used  Substance and Sexual Activity   Alcohol use: Not Currently    Comment: very seldom   Drug use: No   Sexual activity: Not on file  Other Topics Concern   Not on file  Social History Narrative   Retired, married, no children      epworth sleepiness scale = 9 (04/13/16) (has OSA)   Social Determinants of Health   Financial Resource Strain: Not on file  Food Insecurity: Not on file  Transportation Needs: Not on file  Physical Activity: Not on file  Stress: Not on file  Social Connections: Not on file  Intimate Partner Violence: Not on file     Prior to Admission medications   Medication Sig Start Date End  Date Taking? Authorizing Provider  apixaban (ELIQUIS) 5 MG TABS tablet Take 1 tablet (5 mg total) by mouth 2 (two) times daily. 02/18/23   Hilty, Lisette Abu, MD  atorvastatin (LIPITOR) 80 MG tablet Take 80 mg by mouth daily.    [provider]  colchicine 0.6 MG tablet Take 0.6 mg by mouth daily as needed (Gout).    [provider]  dexlansoprazole (DEXILANT) 60 MG capsule Take 60 mg by mouth daily. 01/29/23   [provider]  dicyclomine (BENTYL) 10 MG capsule Take 10 mg by mouth 3 (three) times daily before meals. 02/19/23   [provider]  ezetimibe (ZETIA) 10 MG tablet TAKE 1 TABLET(10 MG) BY MOUTH DAILY 07/16/22   Hilty, Lisette Abu, MD  ferrous sulfate (FEROSUL) 325 (65 FE) MG tablet TAKE 1 TABLET BY MOUTH DAILY WITH BREAKFAST Patient taking differently: Take 325 mg by mouth daily with breakfast. 05/03/22   Hilty, Lisette Abu, MD  fiber (NUTRISOURCE FIBER) PACK packet Take 1 packet by mouth 2 (two) times daily.    [provider]  formoterol (PERFOROMIST) 20 MCG/2ML nebulizer solution Take 40 mcg by nebulization daily. 10/12/20   [provider]  glimepiride (AMARYL) 4 MG tablet Take 4 mg by mouth daily with breakfast.    [provider]  levothyroxine (SYNTHROID, LEVOTHROID) 88 MCG tablet Take 88 mcg by mouth daily before breakfast.    [provider]  metoCLOPramide (REGLAN) 5 MG tablet Take 5 mg by mouth 2 (two) times daily before a meal. 12/21/22   [provider]  OXYGEN Inhale 4 L into the lungs at bedtime. 4 lpm with 24/7 [facility]    [provider]  revefenacin (YUPELRI) 175 MCG/3ML nebulizer solution Inhale 3 mLs (175 mcg total) into the lungs every morning. 07/20/21   Nyoka Cowden, MD    Allergies  Allergen Reactions   Shellfish Allergy Shortness Of Breath, Nausea And Vomiting and Rash    scallops     Fluzone [Influenza Virus Vaccine] Nausea And Vomiting, Palpitations and Rash    REVIEW OF SYSTEMS:   General: no fevers/chills/night sweats Eyes: no blurry vision, diplopia, or amaurosis ENT: no sore throat or hearing loss Resp: no cough, wheezing, or hemoptysis CV: no edema or palpitations GI: no abdominal pain, nausea, vomiting, diarrhea, or constipation GU: no dysuria, frequency, or hematuria Skin: no rash Neuro: no headache, numbness, tingling, or weakness of extremities Musculoskeletal: no joint pain or swelling Heme: no bleeding, DVT, or easy bruising Endo: no polydipsia or polyuria  There were no vitals taken for this visit.  PHYSICAL EXAM: GEN:  AO x 3 in no acute distress HEENT: normal Dentition: Normal*** Neck: JVP normal. +2***carotid upstrokes without bruits. No thyromegaly. Lungs: equal expansion, clear bilaterally CV: Apex is discrete and nondisplaced, RRR without murmur or gallop*** Abd: soft, non-tender, non-distended; no bruit; positive bowel sounds Ext: no edema, ecchymoses, or cyanosis Vascular: 2+ femoral pulses, 2+ radial pulses       Skin: warm and dry without rash Neuro: CN II-XII grossly intact; motor and sensory grossly intact    DATA AND STUDIES:  EKG:  ***  2D ECHO: ***  CARDIAC CATH: ***  STS RISK CALCULATOR: ***  NHYA CLASS: ***    ASSESSMENT AND PLAN:   No diagnosis found.   I have personally reviewed the patients imaging data as summarized above.  I have reviewed the natural history of aortic stenosis with the patient and family members who are present today. We have discussed the limitations of medical therapy and the poor prognosis associated with symptomatic aortic stenosis. We have also reviewed potential treatment options, including palliative medical therapy, conventional surgical aortic valve replacement, and transcatheter aortic valve replacement. We discussed treatment options in the context of this patient's specific comorbid medical conditions.   All of the patient's questions were answered today. Will make further  recommendations based on the results of studies outlined above.   Total time spent with patient today *** minutes. This includes reviewing records, evaluating the patient and coordinating care.   Orbie Pyo, MD  04/15/2023 12:25 PM    [facility] [address], [place], [place]  [address] Phone: [phone]; Fax: [phone]

## 2023-04-17 ENCOUNTER — Ambulatory Visit: Payer: Federal, State, Local not specified - PPO | Attending: Internal Medicine | Admitting: Internal Medicine

## 2023-04-17 ENCOUNTER — Encounter: Payer: Self-pay | Admitting: Internal Medicine

## 2023-04-17 VITALS — BP 110/70 | HR 52 | Ht 68.0 in | Wt 190.2 lb

## 2023-04-17 DIAGNOSIS — D62 Acute posthemorrhagic anemia: Secondary | ICD-10-CM | POA: Diagnosis not present

## 2023-04-17 DIAGNOSIS — I050 Rheumatic mitral stenosis: Secondary | ICD-10-CM | POA: Diagnosis not present

## 2023-04-17 DIAGNOSIS — E118 Type 2 diabetes mellitus with unspecified complications: Secondary | ICD-10-CM

## 2023-04-17 DIAGNOSIS — Z951 Presence of aortocoronary bypass graft: Secondary | ICD-10-CM | POA: Diagnosis not present

## 2023-04-17 DIAGNOSIS — I342 Nonrheumatic mitral (valve) stenosis: Secondary | ICD-10-CM | POA: Diagnosis not present

## 2023-04-17 DIAGNOSIS — I4891 Unspecified atrial fibrillation: Secondary | ICD-10-CM | POA: Diagnosis not present

## 2023-04-17 DIAGNOSIS — K625 Hemorrhage of anus and rectum: Secondary | ICD-10-CM | POA: Diagnosis not present

## 2023-04-17 DIAGNOSIS — E785 Hyperlipidemia, unspecified: Secondary | ICD-10-CM

## 2023-04-17 MED ORDER — ASPIRIN 81 MG PO TBEC: 81.0000 mg | DELAYED_RELEASE_TABLET | Freq: Every day | ORAL | 3 refills | Status: DC

## 2023-04-17 NOTE — Patient Instructions (Addendum)
Medication Instructions:  Please restart all medication except for Eliquis  1.) stop Eliquis 2.) start aspirin 81 mg - take one tablet daily  *If you need a refill on your cardiac medications before your next appointment, please call your pharmacy*   Lab Work: Please return in one week for blood work (CBC)  If you have labs (blood work) drawn today and your tests are completely normal, you will receive your results only by: MyChart Message (if you have MyChart) OR A paper copy in the mail If you have any lab test that is abnormal or we need to change your treatment, we will call you to review the results.   Testing/Procedures: ECHO DUE IN ABOUT 2 WEEKS Your physician has requested that you have an echocardiogram. Echocardiography is a painless test that uses sound waves to create images of your heart. It provides your doctor with information about the size and shape of your heart and how well your heart's chambers and valves are working. This procedure takes approximately one hour. There are no restrictions for this procedure. Please do NOT wear cologne, perfume, aftershave, or lotions (deodorant is allowed). Please arrive 15 minutes prior to your appointment time.    Follow-Up: At [facility], you and your health needs are our priority.  As part of our continuing mission to provide you with exceptional heart care, we have created designated Provider Care Teams.  These Care Teams include your primary Cardiologist (physician) and Advanced Practice Providers (APPs -  Physician Assistants and Nurse Practitioners) who all work together to provide you with the care you need, when you need it.   Your next appointment:   6 month(s)  Provider:   [name], MD  Other Instructions You have been referred to Dr. Steffanie Dunn to discuss Watchman

## 2023-04-19 DIAGNOSIS — I4891 Unspecified atrial fibrillation: Secondary | ICD-10-CM | POA: Diagnosis not present

## 2023-04-19 DIAGNOSIS — I493 Ventricular premature depolarization: Secondary | ICD-10-CM | POA: Diagnosis not present

## 2023-04-22 DIAGNOSIS — E119 Type 2 diabetes mellitus without complications: Secondary | ICD-10-CM | POA: Diagnosis not present

## 2023-04-24 DIAGNOSIS — K625 Hemorrhage of anus and rectum: Secondary | ICD-10-CM | POA: Diagnosis not present

## 2023-04-25 ENCOUNTER — Ambulatory Visit: Payer: Federal, State, Local not specified - PPO

## 2023-04-30 ENCOUNTER — Ambulatory Visit (HOSPITAL_COMMUNITY): Payer: Federal, State, Local not specified - PPO

## 2023-04-30 ENCOUNTER — Ambulatory Visit: Payer: Federal, State, Local not specified - PPO

## 2023-05-02 ENCOUNTER — Ambulatory Visit: Payer: Federal, State, Local not specified - PPO | Admitting: Cardiology

## 2023-05-03 DIAGNOSIS — J441 Chronic obstructive pulmonary disease with (acute) exacerbation: Secondary | ICD-10-CM | POA: Diagnosis not present

## 2023-05-03 DIAGNOSIS — D649 Anemia, unspecified: Secondary | ICD-10-CM | POA: Diagnosis not present

## 2023-05-07 DIAGNOSIS — K819 Cholecystitis, unspecified: Secondary | ICD-10-CM | POA: Diagnosis not present

## 2023-05-07 DIAGNOSIS — D500 Iron deficiency anemia secondary to blood loss (chronic): Secondary | ICD-10-CM | POA: Diagnosis not present

## 2023-05-08 DIAGNOSIS — E1165 Type 2 diabetes mellitus with hyperglycemia: Secondary | ICD-10-CM | POA: Diagnosis not present

## 2023-05-13 ENCOUNTER — Encounter: Payer: Self-pay | Admitting: Pulmonary Disease

## 2023-05-13 ENCOUNTER — Ambulatory Visit: Payer: Federal, State, Local not specified - PPO | Admitting: Pulmonary Disease

## 2023-05-13 VITALS — BP 116/74 | HR 83 | Ht 68.0 in | Wt 195.0 lb

## 2023-05-13 DIAGNOSIS — J441 Chronic obstructive pulmonary disease with (acute) exacerbation: Secondary | ICD-10-CM | POA: Diagnosis not present

## 2023-05-13 MED ORDER — PREDNISONE 10 MG PO TABS
ORAL_TABLET | ORAL | 0 refills | Status: AC
Start: 2023-05-13 — End: 2023-05-24

## 2023-05-13 MED ORDER — AZITHROMYCIN 250 MG PO TABS
ORAL_TABLET | ORAL | 0 refills | Status: DC
Start: 2023-05-13 — End: 2023-06-05

## 2023-05-13 MED ORDER — BUDESONIDE 0.5 MG/2ML IN SUSP
0.5000 mg | Freq: Two times a day (BID) | RESPIRATORY_TRACT | 11 refills | Status: DC
Start: 2023-05-13 — End: 2024-03-10

## 2023-05-13 NOTE — Progress Notes (Signed)
Synopsis: Patient of Dr. Sherene Sires with COPD presenting for acute visit  Subjective:   PATIENT ID: Jeremiah [name] GENDER: male DOB: July 31, 1946, MRN: [id number]   HPI  Chief Complaint  Patient presents with   Acute Visit    States he had a COPD exacerbation about a week ago. Uses 2L of O2 at rest, 4L with exertion.    Jeremiah [name] is a 77 year old male, former smoker with CAD, atrial fibrillation, DMII, chronic diastolic and systolic heart failure, OSA, COPD and chronic hypoxemic respiratory failure who returns to pulmonary clinic for acute visit.   He was recently hospitalized for GI bleeding at [facility] in April 2023 and since then has had issues with his breathing. He was recently treated with prednisone last week by his primary care which helped his symptoms a bit.   He has exertional dyspnea, cough, increased sputum production, No fevers, chills or sweats.   He is currently using performist and yupelri nebulizer treatments.   Past Medical History:  Diagnosis Date   A-fib (HCC)    Anemia    Aortic valve sclerosis 12/2018   Noted on ECHO   Arthritis    CAD (coronary artery disease)    a. 2006: CABG in 2006 with LIMA-LAD, SVG-OM1, and SVG-RPDA   Cardiomegaly 12/2018   Stable, noted on CXR   Carpal tunnel syndrome    Right   Chronic pain 03/21/2016   Colon cancer (HCC) 2006   COPD (chronic obstructive pulmonary disease) (HCC)    Diabetes mellitus without complication (HCC)    DVT (deep venous thrombosis) (HCC)    Right   Essential hypertension 03/21/2016   GERD (gastroesophageal reflux disease)    History of blood transfusion    History of Clostridioides difficile infection    History of prostate cancer 03/21/2016   History of PSVT (paroxysmal supraventricular tachycardia)    HLD (hyperlipidemia)    HTN (hypertension)    Hx of CABG 2006   Hypercholesteremia 03/21/2016   Hypothyroidism    LAE (left atrial enlargement) 12/2018   Severe, Noted on ECHO   LVH (left ventricular  hypertrophy) 12/2018   Mild, Noted on ECHO   Medication management 03/21/2016   Mitral annular calcification 12/2018   with mild MS, Noted on ECHO   Morbid obesity (HCC) 03/21/2016   Myocardial infarct (HCC)    OSA (obstructive sleep apnea) 03/21/2016   uses oxygen at night time   Pain in right ankle and joints of right foot 03/21/2016   Paronychia 03/21/2016   Phimosis    Pneumonia    Primary insomnia 03/21/2016   Prostate cancer (HCC) 2008   Pulmonary hypertension (HCC) 12/2018   Moderate, Noted on ECHO   Tobacco dependence 03/21/2016   Tricuspid regurgitation 12/2018   Mild, Noted on ECHO     Family History  Problem Relation Age of Onset   Dementia Mother    Diabetes Sister      Social History   Socioeconomic History   Marital status: Married    Spouse name: Not on file   Number of children: 0   Years of education: Not on file   Highest education level: Not on file  Occupational History   Occupation: retired - direct [employer]  Tobacco Use   Smoking status: Former    Packs/day: 0.75    Years: 60.00    Additional pack years: 0.00    Total pack years: 45.00    Types: Cigarettes    Quit date: 12/15/2016  Years since quitting: 6.4   Smokeless tobacco: Never  Vaping Use   Vaping Use: Never used  Substance and Sexual Activity   Alcohol use: Not Currently    Comment: very seldom   Drug use: No   Sexual activity: Not on file  Other Topics Concern   Not on file  Social History Narrative   Retired, married, no children      epworth sleepiness scale = 9 (04/13/16) (has OSA)   Social Determinants of Health   Financial Resource Strain: Not on file  Food Insecurity: Not on file  Transportation Needs: Not on file  Physical Activity: Not on file  Stress: Not on file  Social Connections: Not on file  Intimate Partner Violence: Not on file     Allergies  Allergen Reactions   Shellfish Allergy Shortness Of Breath, Nausea And Vomiting and Rash    scallops     Fluzone  [Influenza Virus Vaccine] Nausea And Vomiting, Palpitations and Rash     Outpatient Medications Prior to Visit  Medication Sig Dispense Refill   aspirin EC 81 MG tablet Take 1 tablet (81 mg total) by mouth daily. Swallow whole. 90 tablet 3   atorvastatin (LIPITOR) 80 MG tablet Take 80 mg by mouth daily.     colchicine 0.6 MG tablet Take 0.6 mg by mouth daily as needed (Gout).     dexlansoprazole (DEXILANT) 60 MG capsule Take 60 mg by mouth daily.     dicyclomine (BENTYL) 10 MG capsule Take 10 mg by mouth 3 (three) times daily before meals.     ezetimibe (ZETIA) 10 MG tablet TAKE 1 TABLET(10 MG) BY MOUTH DAILY 90 tablet 2   ferrous sulfate (FEROSUL) 325 (65 FE) MG tablet TAKE 1 TABLET BY MOUTH DAILY WITH BREAKFAST 90 tablet 3   fiber (NUTRISOURCE FIBER) PACK packet Take 1 packet by mouth 2 (two) times daily.     formoterol (PERFOROMIST) 20 MCG/2ML nebulizer solution Take 40 mcg by nebulization daily.     glimepiride (AMARYL) 4 MG tablet Take 4 mg by mouth daily with breakfast.     levothyroxine (SYNTHROID, LEVOTHROID) 88 MCG tablet Take 88 mcg by mouth daily before breakfast.     metoCLOPramide (REGLAN) 5 MG tablet Take 5 mg by mouth 2 (two) times daily before a meal.     OXYGEN Inhale 4 L into the lungs at bedtime. 4 lpm with 24/7 AHC     revefenacin (YUPELRI) 175 MCG/3ML nebulizer solution Inhale 3 mLs (175 mcg total) into the lungs every morning. 90 mL 11   No facility-administered medications prior to visit.    Review of Systems  Constitutional:  Negative for chills, fever, malaise/fatigue and weight loss.  HENT:  Negative for congestion, sinus pain and sore throat.   Eyes: Negative.   Respiratory:  Positive for cough, sputum production, shortness of breath and wheezing. Negative for hemoptysis.   Cardiovascular:  Negative for chest pain, palpitations, orthopnea, claudication and leg swelling.  Gastrointestinal:  Negative for abdominal pain, heartburn, nausea and vomiting.   Genitourinary: Negative.   Musculoskeletal:  Negative for joint pain and myalgias.  Skin:  Negative for rash.  Neurological:  Negative for weakness.  Endo/Heme/Allergies: Negative.   Psychiatric/Behavioral: Negative.       Objective:   Vitals:   05/13/23 1322  BP: 116/74  Pulse: 83  SpO2: 96%  Weight: 195 lb (88.5 kg)  Height: 5\' 8"  (1.727 m)     Physical Exam Constitutional:  General: He is not in acute distress. HENT:     Head: Normocephalic and atraumatic.  Eyes:     Conjunctiva/sclera: Conjunctivae normal.  Cardiovascular:     Rate and Rhythm: Normal rate and regular rhythm.     Pulses: Normal pulses.     Heart sounds: Normal heart sounds. No murmur heard. Pulmonary:     Breath sounds: Wheezing present. No rhonchi or rales.  Musculoskeletal:     Right lower leg: No edema.     Left lower leg: No edema.  Lymphadenopathy:     Cervical: No cervical adenopathy.  Skin:    General: Skin is warm and dry.  Neurological:     General: No focal deficit present.     Mental Status: He is alert.    CBC    Component Value Date/Time   WBC 9.5 02/05/2023 1740   RBC 4.43 02/05/2023 1740   HGB 13.3 02/05/2023 1740   HGB 8.2 (L) 12/29/2018 1507   HCT 42.5 02/05/2023 1740   HCT 27.0 (L) 12/29/2018 1507   PLT 223 02/05/2023 1740   PLT 208 12/29/2018 1507   MCV 95.9 02/05/2023 1740   MCV 83 12/29/2018 1507   MCH 30.0 02/05/2023 1740   MCHC 31.3 02/05/2023 1740   RDW 14.8 02/05/2023 1740   RDW 17.1 (H) 12/29/2018 1507   LYMPHSABS 2.4 02/05/2023 1740   MONOABS 0.8 02/05/2023 1740   EOSABS 0.1 02/05/2023 1740   BASOSABS 0.0 02/05/2023 1740     Chest imaging: CTA Chest 04/12/23 Multilevel mediastinal adenopathy with index right lower paratracheal node measuring 1.3 cm. Small hiatal hernia. Small right greater than left pleural effusions with adjacent compressive atelectasis. Moderate upper lobe predominant centrilobular and mild paraseptal emphysema. Diffuse  airway and interlobular septal thickening. Adherent secretions within the trachea no pneumothorax.   PFT:    Latest Ref Rng & Units 02/05/2019   12:56 PM 10/28/2018    8:51 AM 04/01/2017   10:20 AM  PFT Results  FVC-Pre L 2.09  2.00  2.33   FVC-Predicted Pre % 53  51  59   FVC-Post L 2.31  2.12  2.36   FVC-Predicted Post % 59  54  59   Pre FEV1/FVC % % 61  58  55   Post FEV1/FCV % % 67  59  43   FEV1-Pre L 1.27  1.16  1.29   FEV1-Predicted Pre % 44  40  44   FEV1-Post L 1.53  1.25  1.01   DLCO uncorrected ml/min/mmHg 14.34  19.22  18.60   DLCO UNC% % 61  67  65   DLCO corrected ml/min/mmHg 18.95   19.10   DLCO COR %Predicted % 80   67   DLVA Predicted % 120  88  85   TLC L 4.83  5.58  5.76   TLC % Predicted % 75  86  89   RV % Predicted % 109  134  132     Labs:  Path:  Echo:  Heart Catheterization:       Assessment & Plan:   COPD with acute exacerbation (HCC) - Plan: predniSONE (DELTASONE) 10 MG tablet, azithromycin (ZITHROMAX) 250 MG tablet, budesonide (PULMICORT) 0.5 MG/2ML nebulizer solution  Discussion: Jeremiah [name] is a 77 year old male, former smoker with CAD, atrial fibrillation, DMII, chronic diastolic and systolic heart failure, OSA, COPD and chronic hypoxemic respiratory failure who returns to pulmonary clinic for acute visit.   He has acute exacerbation of his  COPD. He is to start Zpak for 5 days. EKG with Qtc 483 on 04/13/23. He is to start 12 day steroid taper. Will add budesonide nebulizer treatments twice daily to his performist and yupelri nebs.   Follow up with Dr. Sherene Sires as scheduled.  Melody Comas, MD [facility] Office: [phone]   Current Outpatient Medications:    aspirin EC 81 MG tablet, Take 1 tablet (81 mg total) by mouth daily. Swallow whole., Disp: 90 tablet, Rfl: 3   atorvastatin (LIPITOR) 80 MG tablet, Take 80 mg by mouth daily., Disp: , Rfl:    azithromycin (ZITHROMAX) 250 MG tablet, Take as directed, Disp:  6 tablet, Rfl: 0   budesonide (PULMICORT) 0.5 MG/2ML nebulizer solution, Take 2 mLs (0.5 mg total) by nebulization 2 (two) times daily., Disp: 120 mL, Rfl: 11   colchicine 0.6 MG tablet, Take 0.6 mg by mouth daily as needed (Gout)., Disp: , Rfl:    dexlansoprazole (DEXILANT) 60 MG capsule, Take 60 mg by mouth daily., Disp: , Rfl:    dicyclomine (BENTYL) 10 MG capsule, Take 10 mg by mouth 3 (three) times daily before meals., Disp: , Rfl:    ezetimibe (ZETIA) 10 MG tablet, TAKE 1 TABLET(10 MG) BY MOUTH DAILY, Disp: 90 tablet, Rfl: 2   ferrous sulfate (FEROSUL) 325 (65 FE) MG tablet, TAKE 1 TABLET BY MOUTH DAILY WITH BREAKFAST, Disp: 90 tablet, Rfl: 3   fiber (NUTRISOURCE FIBER) PACK packet, Take 1 packet by mouth 2 (two) times daily., Disp: , Rfl:    formoterol (PERFOROMIST) 20 MCG/2ML nebulizer solution, Take 40 mcg by nebulization daily., Disp: , Rfl:    glimepiride (AMARYL) 4 MG tablet, Take 4 mg by mouth daily with breakfast., Disp: , Rfl:    levothyroxine (SYNTHROID, LEVOTHROID) 88 MCG tablet, Take 88 mcg by mouth daily before breakfast., Disp: , Rfl:    metoCLOPramide (REGLAN) 5 MG tablet, Take 5 mg by mouth 2 (two) times daily before a meal., Disp: , Rfl:    OXYGEN, Inhale 4 L into the lungs at bedtime. 4 lpm with 24/7 AHC, Disp: , Rfl:    predniSONE (DELTASONE) 10 MG tablet, Take 4 tablets (40 mg total) by mouth daily with breakfast for 3 days, THEN 3 tablets (30 mg total) daily with breakfast for 3 days, THEN 2 tablets (20 mg total) daily with breakfast for 3 days, THEN 1 tablet (10 mg total) daily with breakfast for 3 days., Disp: 30 tablet, Rfl: 0   revefenacin (YUPELRI) 175 MCG/3ML nebulizer solution, Inhale 3 mLs (175 mcg total) into the lungs every morning., Disp: 90 mL, Rfl: 11

## 2023-05-13 NOTE — Patient Instructions (Addendum)
We will treat you for exacerbation of your COPD  Start prolonged prednisone taper: 40mg  daily x 3 days 30mg  daily x3 days 20mg  daily x 3 days 10mg  daily x 3 days  Start Zpak antibiotic for 5 days  Start budesonide 0.5mg  nebulizer treatment twice dialy  Continue performist twice dialy  Continue yupelri daily  Follow up with Dr. Sherene Sires as scheduled

## 2023-05-14 ENCOUNTER — Other Ambulatory Visit: Payer: Self-pay

## 2023-05-14 ENCOUNTER — Telehealth: Payer: Self-pay | Admitting: Pulmonary Disease

## 2023-05-14 DIAGNOSIS — J449 Chronic obstructive pulmonary disease, unspecified: Secondary | ICD-10-CM

## 2023-05-14 NOTE — Telephone Encounter (Signed)
Pt calling back about not rcving a nebulizer, pls advise

## 2023-05-14 NOTE — Telephone Encounter (Signed)
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 Can Dr. Malena Peer a machine please?  [facility] on [address]

## 2023-05-14 NOTE — Telephone Encounter (Signed)
Order has been placed, pt came into the office and it has been taking care of. Nothing further needed at this time

## 2023-05-15 ENCOUNTER — Other Ambulatory Visit: Payer: Self-pay

## 2023-05-15 ENCOUNTER — Telehealth: Payer: Self-pay | Admitting: Pulmonary Disease

## 2023-05-15 DIAGNOSIS — J449 Chronic obstructive pulmonary disease, unspecified: Secondary | ICD-10-CM

## 2023-05-15 NOTE — Telephone Encounter (Signed)
To pick order for neb machine to take to pharmacy

## 2023-05-15 NOTE — Telephone Encounter (Signed)
The referral and notes faxed to [facility] on [address]

## 2023-05-15 NOTE — Telephone Encounter (Signed)
This has been faxed to [facility] and printed for pt to come pick up from the office, nothing further needed

## 2023-05-15 NOTE — Telephone Encounter (Signed)
Called pharmacy as a due diligence, pharmacy can get nebulizer machine, but patient will have to pay around $70 and they cannot get it until tomorrow- [facility] currently does not have the script/order. Please advise.

## 2023-05-15 NOTE — Telephone Encounter (Signed)
PT states he wanted to speak w/office manager. States he got his meds for his neb machine but no machine. He called several times, he said, and could not get the RX called in. He came in yesterday and Toni Amend came down and said she would take care of it. Today, he said, no RX was rec'd. Tsf call to [facility].

## 2023-05-17 DIAGNOSIS — D649 Anemia, unspecified: Secondary | ICD-10-CM | POA: Diagnosis not present

## 2023-05-20 DIAGNOSIS — D500 Iron deficiency anemia secondary to blood loss (chronic): Secondary | ICD-10-CM | POA: Diagnosis not present

## 2023-05-23 DIAGNOSIS — D500 Iron deficiency anemia secondary to blood loss (chronic): Secondary | ICD-10-CM | POA: Diagnosis not present

## 2023-05-28 DIAGNOSIS — D500 Iron deficiency anemia secondary to blood loss (chronic): Secondary | ICD-10-CM | POA: Diagnosis not present

## 2023-05-29 ENCOUNTER — Other Ambulatory Visit: Payer: Self-pay

## 2023-05-29 ENCOUNTER — Encounter (HOSPITAL_COMMUNITY): Payer: Self-pay | Admitting: Emergency Medicine

## 2023-05-29 ENCOUNTER — Emergency Department (HOSPITAL_COMMUNITY)
Admission: EM | Admit: 2023-05-29 | Discharge: 2023-05-29 | Disposition: A | Discharge status: Home / Self Care | Payer: Federal, State, Local not specified - PPO | Attending: Emergency Medicine | Admitting: Emergency Medicine

## 2023-05-29 DIAGNOSIS — I4891 Unspecified atrial fibrillation: Secondary | ICD-10-CM | POA: Diagnosis not present

## 2023-05-29 DIAGNOSIS — D500 Iron deficiency anemia secondary to blood loss (chronic): Secondary | ICD-10-CM

## 2023-05-29 DIAGNOSIS — Z7982 Long term (current) use of aspirin: Secondary | ICD-10-CM | POA: Insufficient documentation

## 2023-05-29 DIAGNOSIS — D649 Anemia, unspecified: Secondary | ICD-10-CM

## 2023-05-29 DIAGNOSIS — K922 Gastrointestinal hemorrhage, unspecified: Secondary | ICD-10-CM | POA: Diagnosis not present

## 2023-05-29 DIAGNOSIS — J449 Chronic obstructive pulmonary disease, unspecified: Secondary | ICD-10-CM

## 2023-05-29 DIAGNOSIS — R7989 Other specified abnormal findings of blood chemistry: Secondary | ICD-10-CM | POA: Diagnosis not present

## 2023-05-29 LAB — COMPREHENSIVE METABOLIC PANEL
ALT: 17 U/L (ref 0–44)
AST: 21 U/L (ref 15–41)
Albumin: 3.4 g/dL — ABNORMAL LOW (ref 3.5–5.0)
Alkaline Phosphatase: 60 U/L (ref 38–126)
Anion gap: 11 (ref 5–15)
BUN: 25 mg/dL — ABNORMAL HIGH (ref 8–23)
CO2: 22 mmol/L (ref 22–32)
Calcium: 8.5 mg/dL — ABNORMAL LOW (ref 8.9–10.3)
Chloride: 101 mmol/L (ref 98–111)
Creatinine, Ser: 0.83 mg/dL (ref 0.61–1.24)
GFR, Estimated: >60 mL/min (ref >60)
Glucose, Bld: 190 mg/dL — ABNORMAL HIGH (ref 70–99)
Potassium: 4.1 mmol/L (ref 3.5–5.1)
Sodium: 134 mmol/L — ABNORMAL LOW (ref 135–145)
Total Bilirubin: 0.6 mg/dL (ref 0.3–1.2)
Total Protein: 6.3 g/dL — ABNORMAL LOW (ref 6.5–8.1)

## 2023-05-29 LAB — CBC WITH DIFFERENTIAL/PLATELET
Abs Immature Granulocytes: 0.07 10*3/uL (ref 0.00–0.07)
Basophils Absolute: 0 10*3/uL (ref 0.0–0.1)
Basophils Relative: 0 %
Eosinophils Absolute: 0.1 10*3/uL (ref 0.0–0.5)
Eosinophils Relative: 1 %
HCT: 24.6 % — ABNORMAL LOW (ref 39.0–52.0)
Hemoglobin: 6.6 g/dL — CL (ref 13.0–17.0)
Immature Granulocytes: 1 %
Lymphocytes Relative: 12 %
Lymphs Abs: 0.8 10*3/uL (ref 0.7–4.0)
MCH: 25.1 pg — ABNORMAL LOW (ref 26.0–34.0)
MCHC: 26.8 g/dL — ABNORMAL LOW (ref 30.0–36.0)
MCV: 93.5 fL (ref 80.0–100.0)
Monocytes Absolute: 0.6 10*3/uL (ref 0.1–1.0)
Monocytes Relative: 11 %
Neutro Abs: 4.5 10*3/uL (ref 1.7–7.7)
Neutrophils Relative %: 75 %
Platelets: 186 10*3/uL (ref 150–400)
RBC: 2.63 MIL/uL — ABNORMAL LOW (ref 4.22–5.81)
RDW: 16.1 % — ABNORMAL HIGH (ref 11.5–15.5)
WBC: 6.1 10*3/uL (ref 4.0–10.5)
nRBC: 0 % (ref 0.0–0.2)

## 2023-05-29 LAB — CBC
HCT: 28.1 % — ABNORMAL LOW (ref 39.0–52.0)
Hemoglobin: 8 g/dL — ABNORMAL LOW (ref 13.0–17.0)
MCH: 25.5 pg — ABNORMAL LOW (ref 26.0–34.0)
MCHC: 28.5 g/dL — ABNORMAL LOW (ref 30.0–36.0)
MCV: 89.5 fL (ref 80.0–100.0)
Platelets: 181 10*3/uL (ref 150–400)
RBC: 3.14 MIL/uL — ABNORMAL LOW (ref 4.22–5.81)
RDW: 16.8 % — ABNORMAL HIGH (ref 11.5–15.5)
WBC: 5.9 10*3/uL (ref 4.0–10.5)
nRBC: 0 % (ref 0.0–0.2)

## 2023-05-29 LAB — TYPE AND SCREEN: Unit division: 0

## 2023-05-29 LAB — PROTIME-INR
INR: 1.1 (ref 0.8–1.2)
Prothrombin Time: 14.7 seconds (ref 11.4–15.2)

## 2023-05-29 LAB — PREPARE RBC (CROSSMATCH)

## 2023-05-29 LAB — POC OCCULT BLOOD, ED: Fecal Occult Bld: POSITIVE — AB

## 2023-05-29 MED ORDER — PANTOPRAZOLE SODIUM 40 MG IV SOLR
40.0000 mg | Freq: Once | INTRAVENOUS | Status: AC
  Administered 2023-05-29: 40 mg via INTRAVENOUS
  Filled 2023-05-29: qty 10

## 2023-05-29 MED ORDER — SODIUM CHLORIDE 0.9% IV SOLUTION: Freq: Once | INTRAVENOUS | Status: AC

## 2023-05-29 NOTE — ED Provider Notes (Signed)
[place] EMERGENCY DEPARTMENT AT [facility] Provider Note   CSN: [id number] Arrival date & time: 05/29/23  4782     History  Chief Complaint  Patient presents with   Abnormal Lab    Hemoglobin     Jeremiah [name] is a 77 y.o. male, history of A-fib, chronic GI bleed, ischemic colitis, stents and SMA, celiac plexus, who presents to the ED secondary to low hemoglobin 6.9.  He has known chronic GI bleed, and sees GI at [facility].  Had his hemoglobin checked several times this past week, and is downtrending, down to 6.9.  At last hospitalization his hemoglobin was 9.1 per patient.  He is no longer on his Eliquis, but has continued his baby aspirin.  Denies any bloody stools, black stools, or abdominal pain.  He does not have any nausea or vomiting.     Home Medications Prior to Admission medications   Medication Sig Start Date End Date Taking? Authorizing Provider  aspirin EC 81 MG tablet Take 1 tablet (81 mg total) by mouth daily. Swallow whole. 04/17/23   Orbie Pyo, MD  atorvastatin (LIPITOR) 80 MG tablet Take 80 mg by mouth daily.    [provider]  azithromycin (ZITHROMAX) 250 MG tablet Take as directed 05/13/23   Martina Sinner, MD  budesonide (PULMICORT) 0.5 MG/2ML nebulizer solution Take 2 mLs (0.5 mg total) by nebulization 2 (two) times daily. 05/13/23   Martina Sinner, MD  colchicine 0.6 MG tablet Take 0.6 mg by mouth daily as needed (Gout).    [provider]  dexlansoprazole (DEXILANT) 60 MG capsule Take 60 mg by mouth daily. 01/29/23   [provider]  dicyclomine (BENTYL) 10 MG capsule Take 10 mg by mouth 3 (three) times daily before meals. 02/19/23   [provider]  ezetimibe (ZETIA) 10 MG tablet TAKE 1 TABLET(10 MG) BY MOUTH DAILY 07/16/22   Hilty, Lisette Abu, MD  ferrous sulfate (FEROSUL) 325 (65 FE) MG tablet TAKE 1 TABLET BY MOUTH DAILY WITH BREAKFAST 05/03/22   Hilty, Lisette Abu, MD  fiber (NUTRISOURCE FIBER) PACK  packet Take 1 packet by mouth 2 (two) times daily.    [provider]  formoterol (PERFOROMIST) 20 MCG/2ML nebulizer solution Take 40 mcg by nebulization daily. 10/12/20   [provider]  glimepiride (AMARYL) 4 MG tablet Take 4 mg by mouth daily with breakfast.    [provider]  levothyroxine (SYNTHROID, LEVOTHROID) 88 MCG tablet Take 88 mcg by mouth daily before breakfast.    [provider]  metoCLOPramide (REGLAN) 5 MG tablet Take 5 mg by mouth 2 (two) times daily before a meal. 12/21/22   [provider]  OXYGEN Inhale 4 L into the lungs at bedtime. 4 lpm with 24/7 [facility]    [provider]  revefenacin (YUPELRI) 175 MCG/3ML nebulizer solution Inhale 3 mLs (175 mcg total) into the lungs every morning. 07/20/21   Nyoka Cowden, MD      Allergies    Shellfish allergy and Fluzone [influenza virus vaccine]    Review of Systems   Review of Systems  Gastrointestinal:  Negative for abdominal pain, constipation and diarrhea.    Physical Exam Updated Vital Signs BP (!) 116/46   Pulse 64   Temp 98.5 F (36.9 C) (Oral)   Resp (!) 23   Ht 5\' 8"  (1.727 m)   Wt 88.5 kg   SpO2 100%   BMI 29.65 kg/m  Physical Exam Vitals and  nursing note reviewed. Exam conducted with a chaperone present.  Constitutional:      General: He is not in acute distress.    Appearance: He is well-developed.  HENT:     Head: Normocephalic and atraumatic.  Eyes:     Conjunctiva/sclera: Conjunctivae normal.  Cardiovascular:     Rate and Rhythm: Normal rate and regular rhythm.     Heart sounds: No murmur heard. Pulmonary:     Effort: Pulmonary effort is normal. No respiratory distress.     Breath sounds: Normal breath sounds.     Comments: +3L O2 Abdominal:     Palpations: Abdomen is soft.     Tenderness: There is no abdominal tenderness.  Genitourinary:    Rectum: Guaiac result positive.     Comments: Black stool Musculoskeletal:        General: No  swelling.     Cervical back: Neck supple.  Skin:    General: Skin is warm and dry.     Capillary Refill: Capillary refill takes less than 2 seconds.  Neurological:     Mental Status: He is alert.  Psychiatric:        Mood and Affect: Mood normal.     ED Results / Procedures / Treatments   Labs (all labs ordered are listed, but only abnormal results are displayed) Labs Reviewed  CBC WITH DIFFERENTIAL/PLATELET - Abnormal; Notable for the following components:      Result Value   RBC 2.63 (*)    Hemoglobin 6.6 (*)    HCT 24.6 (*)    MCH 25.1 (*)    MCHC 26.8 (*)    RDW 16.1 (*)    All other components within normal limits  COMPREHENSIVE METABOLIC PANEL - Abnormal; Notable for the following components:   Sodium 134 (*)    Glucose, Bld 190 (*)    BUN 25 (*)    Calcium 8.5 (*)    Total Protein 6.3 (*)    Albumin 3.4 (*)    All other components within normal limits  POC OCCULT BLOOD, ED - Abnormal; Notable for the following components:   Fecal Occult Bld POSITIVE (*)    All other components within normal limits  PROTIME-INR  OCCULT BLOOD X 1 CARD TO LAB, STOOL  IRON AND TIBC  VITAMIN B12  HEMOGLOBIN AND HEMATOCRIT, BLOOD  TYPE AND SCREEN  PREPARE RBC (CROSSMATCH)    EKG None  Radiology No results found.  Procedures Procedures    Medications Ordered in ED Medications  pantoprazole (PROTONIX) injection 40 mg (40 mg Intravenous Given 05/29/23 0849)  0.9 %  sodium chloride infusion (Manually program via Guardrails IV Fluids) ( Intravenous New Bag/Given 05/29/23 1342)    ED Course/ Medical Decision Making/ A&P                             Medical Decision Making Patient is a 77 year old male, history of chronic GI bleed, seen by GI multiple times, he is here because his hemoglobin is 6.9 and was referred by his GI doctor, to return to the ER for blood.  He has had multiple blood transfusions.  Is not currently on any Eliquis not have any abdominal pain or bloody  stools or black stools.  On rectal exam he has melena.  Is requiring 3 L O2 for comfort, typically uses 2 L O2 at home.  I will obtain labs, including type and screen, and likely transfuse.  Amount  and/or Complexity of Data Reviewed Labs: ordered.    Details: Hemoglobin is 6.6 Discussion of management or test interpretation with external provider(s): Discussed with GI, they evaluated patient at bedside, recommend that the hemoglobin be 7, patient is wanting to leave, and they stated okay as long as hemoglobin above 7.  We will repeat hemoglobin once transfusion is complete.  2 units ordered.  He states he has home oxygen at home, is not having any abdominal pain.  He is a known patient of GI, and is supposed to have his pill endoscopy done and an echocardiogram done tomorrow.  He should continue taking his pantoprazole and he will need to have his iron checked by his GI doctor.  Handed off to [facility], pending second hemoglobin check, as well as completion of transfusion of blood.  Risk Prescription drug management.    Final Clinical Impression(s) / ED Diagnoses Final diagnoses:  Symptomatic anemia  Chronic GI bleeding    Rx / DC Orders ED Discharge Orders     None         Cayman Kielbasa, Harley [place], PA 05/29/23 1535    [place], [place], [place] 05/30/23 [phone]

## 2023-05-29 NOTE — ED Notes (Signed)
Blood bank contacted about ETA of blood to be released, relays will take additional time because of pt having antibodies present. Blood bank could not give ETA of when blood would be ready.

## 2023-05-29 NOTE — ED Provider Notes (Signed)
  Physical Exam  BP (!) 116/46   Pulse 64   Temp 98.5 F (36.9 C) (Oral)   Resp (!) 23   Ht 5\' 8"  (1.727 m)   Wt 88.5 kg   SpO2 100%   BMI 29.65 kg/m   Physical Exam Vitals and nursing note reviewed.  Constitutional:      Appearance: Normal appearance.  HENT:     Head: Normocephalic and atraumatic.     Mouth/Throat:     Mouth: Mucous membranes are moist.  Eyes:     Conjunctiva/sclera: Conjunctivae normal.     Pupils: Pupils are equal, round, and reactive to light.  Cardiovascular:     Rate and Rhythm: Normal rate and regular rhythm.  Pulmonary:     Effort: Pulmonary effort is normal.  Skin:    General: Skin is warm and dry.     Findings: No rash.  Neurological:     General: No focal deficit present.     Mental Status: He is alert.  Psychiatric:        Mood and Affect: Mood normal.        Behavior: Behavior normal.     Procedures  Procedures: 2 unit of RBCs transfused in the ED  ED Course / MDM   Clinical Course as of 05/29/23 2017  Wed May 29, 2023  2015 Second bag of RBCs finished. Repeat CBC ordered [HT]    Clinical Course User Index [HT] Maxwell Marion, PA-C   Medical Decision Making Amount and/or Complexity of Data Reviewed Labs: ordered.  Risk Prescription drug management.   Patient handed off from [place] at the time of shift change.  Patient is a 77 year old male with history of chronic GI bleed.  He was referred here today by his GI doctor at [facility] for transfusion because his last hemoglobin was 6.9.  Patient has extensive GI history and reports extensive hospital stays for the same issue. He does not wished to be hospitalized today. Patient has follow up with his provider tomorrow. GI was consulted and said if patient can get his hemoglobin to 7 he can be discharged home. Patient receiving 2 units of RBC at time of hand off.  On re-evaluation patient reports that he feels well, no N/V. He is half way through his second bag of  RBCs.  Hemoglobin of 8 on repeat CBC. Patient is stable and safe for discharge home.    Maxwell Marion, PA-C 05/29/23 2222    Glendora Score, MD 05/30/23 1322

## 2023-05-29 NOTE — Discharge Instructions (Signed)
Return to ED if: Your bleeding does not stop. You feel dizzy or you pass out (faint). You feel weak. You have very bad cramps in your back or belly. You pass large clumps of blood (clots) in your poop. Your symptoms are getting worse. You have chest pain or fast heartbeats.

## 2023-05-29 NOTE — Consult Note (Signed)
Consultation  Referring Provider:   ER  Primary Care Physician:  [name], MD Primary Gastroenterologist:  [facility]  (use to be [facility] 10/2022)   Reason for Consultation:    anemia and melena         HPI:   Jeremiah [name] is a 77 y.o. male with past medical history significant for significant past medical history CAD, COPD, hyperlipidemia, gout, type 2 diabetes, chronic respiratory failure on oxygen at night and occasionally during the day, mesenteric ischemia status post vascular surgery March, diverticulosis with possible diverticular bleed, IDA requiring multiple transfusions secondary to chronic blood loss, atrial fibrillation off Eliquis at this time, presents with anemia.  Had multiple recent hospital admissions 3/28 through 4/7 found to have mesenteric ischemia SMA vascular surgery discharged on Eliquis and aspirin.  Continue to have hemorrhagic shock and dark stools with admission 4/13 through 4/21 EGD mild mucosal oozing status post clip, colonoscopy diverticulosis with presumed diverticular bleed.  Readmitted 5/10/512 with continue anemia CTA suggestive of bleeding of branch of SMA declined IR embolization improved with blood products.  Eliquis was discontinued during that hospitalization aspirin continued alone. Patient is been following with [facility], supposed be scheduled for outpatient capsule endoscopy and continues with supportive care with multiple transfusions. Patient's hemoglobin has been monitored very closely outpatient, has been 7.2 but most recently went down to 6.9 so patient was referred back to hospital for supportive care, came to [facility] because he lives in this area. Patient states since hospitalization he has had more formed stools, brown, can be dark but he has been on iron once daily since April.   He has not had any IV iron.  Last B12 low normal.  Probably Patient states he has had some weakness, he is nonmobile in  wheelchair, some shortness of breath and leg swelling. Denies chest pain. Denies nausea, vomiting, abdominal pain, reflux, hematochezia or melena. Patient is on PPI twice daily outpatient.  Patient uses 2 L of oxygen primarily at night occasionally during the day, currently on it at this time. He does have COPD and uses occasional prednisone last dose was end of May.  Otherwise he denies any NSAIDs, no alcohol, no smoking history, no drug use.  Patient states he supposed to be following with [name] and Dr. Lalla Brothers tomorrow for echocardiogram and labs for Watchman device since he is off his Eliquis, patient states he does not want to miss his appointment, discussed possible inpatient capsule endoscopy but he declines at this time.    Abnormal ED labs: Abnormal Labs Reviewed  CBC WITH DIFFERENTIAL/PLATELET - Abnormal; Notable for the following components:      Result Value   RBC 2.63 (*)    Hemoglobin 6.6 (*)    HCT 24.6 (*)    MCH 25.1 (*)    MCHC 26.8 (*)    RDW 16.1 (*)    All other components within normal limits  COMPREHENSIVE METABOLIC PANEL - Abnormal; Notable for the following components:   Sodium 134 (*)    Glucose, Bld 190 (*)    BUN 25 (*)    Calcium 8.5 (*)    Total Protein 6.3 (*)    Albumin 3.4 (*)    All other components within normal limits  POC OCCULT BLOOD, ED - Abnormal; Notable for the following components:   Fecal Occult Bld POSITIVE (*)    All other components within normal limits     Past  Medical History:  Diagnosis Date   A-fib [facility])    Anemia    Aortic valve sclerosis 12/2018   Noted on ECHO   Arthritis    CAD (coronary artery disease)    a. 2006: CABG in 2006 with LIMA-LAD, SVG-OM1, and SVG-RPDA   Cardiomegaly 12/2018   Stable, noted on CXR   Carpal tunnel syndrome    Right   Chronic pain 03/21/2016   Colon cancer (HCC) 2006   COPD (chronic obstructive pulmonary disease) (HCC)    Diabetes mellitus without complication (HCC)    DVT (deep  venous thrombosis) (HCC)    Right   Essential hypertension 03/21/2016   GERD (gastroesophageal reflux disease)    History of blood transfusion    History of Clostridioides difficile infection    History of prostate cancer 03/21/2016   History of PSVT (paroxysmal supraventricular tachycardia)    HLD (hyperlipidemia)    HTN (hypertension)    Hx of CABG 2006   Hypercholesteremia 03/21/2016   Hypothyroidism    LAE (left atrial enlargement) 12/2018   Severe, Noted on ECHO   LVH (left ventricular hypertrophy) 12/2018   Mild, Noted on ECHO   Medication management 03/21/2016   Mitral annular calcification 12/2018   with mild MS, Noted on ECHO   Morbid obesity (HCC) 03/21/2016   Myocardial infarct (HCC)    OSA (obstructive sleep apnea) 03/21/2016   uses oxygen at night time   Pain in right ankle and joints of right foot 03/21/2016   Paronychia 03/21/2016   Phimosis    Pneumonia    Primary insomnia 03/21/2016   Prostate cancer (HCC) 2008   Pulmonary hypertension (HCC) 12/2018   Moderate, Noted on ECHO   Tobacco dependence 03/21/2016   Tricuspid regurgitation 12/2018   Mild, Noted on ECHO    Surgical History:  He  has a past surgical history that includes Coronary artery bypass graft (2006); Ankle surgery (Right, 12/2013); Prostate surgery (2008); Cardiac catheterization (N/A, 05/08/2016); Cardiac catheterization (N/A, 08/24/2016); Mitral valve replacement; Circumcision (N/A, 10/07/2019); and Umbilical hernia repair (N/A, 05/26/2022). Family History:  His family history includes Dementia in his mother; Diabetes in his sister. Social History:   reports that he quit smoking about 6 years ago. His smoking use included cigarettes. He has a 45.00 pack-year smoking history. He has never used smokeless tobacco. He reports that he does not currently use alcohol. He reports that he does not use drugs.  Prior to Admission medications   Medication Sig Start Date End Date Taking? Authorizing Provider   aspirin EC 81 MG tablet Take 1 tablet (81 mg total) by mouth daily. Swallow whole. 04/17/23   Orbie Pyo, MD  atorvastatin (LIPITOR) 80 MG tablet Take 80 mg by mouth daily.    [provider]  azithromycin (ZITHROMAX) 250 MG tablet Take as directed 05/13/23   Martina Sinner, MD  budesonide (PULMICORT) 0.5 MG/2ML nebulizer solution Take 2 mLs (0.5 mg total) by nebulization 2 (two) times daily. 05/13/23   Martina Sinner, MD  colchicine 0.6 MG tablet Take 0.6 mg by mouth daily as needed (Gout).    [provider]  dexlansoprazole (DEXILANT) 60 MG capsule Take 60 mg by mouth daily. 01/29/23   [provider]  dicyclomine (BENTYL) 10 MG capsule Take 10 mg by mouth 3 (three) times daily before meals. 02/19/23   [provider]  ezetimibe (ZETIA) 10 MG tablet TAKE 1 TABLET(10 MG) BY MOUTH DAILY 07/16/22   Hilty, Lisette Abu, MD  ferrous sulfate (FEROSUL) 325 (65 FE) MG tablet TAKE 1 TABLET BY MOUTH DAILY WITH BREAKFAST 05/03/22   Hilty, Lisette Abu, MD  fiber (NUTRISOURCE FIBER) PACK packet Take 1 packet by mouth 2 (two) times daily.    [provider]  formoterol (PERFOROMIST) 20 MCG/2ML nebulizer solution Take 40 mcg by nebulization daily. 10/12/20   [provider]  glimepiride (AMARYL) 4 MG tablet Take 4 mg by mouth daily with breakfast.    [provider]  levothyroxine (SYNTHROID, LEVOTHROID) 88 MCG tablet Take 88 mcg by mouth daily before breakfast.    [provider]  metoCLOPramide (REGLAN) 5 MG tablet Take 5 mg by mouth 2 (two) times daily before a meal. 12/21/22   [provider]  OXYGEN Inhale 4 L into the lungs at bedtime. 4 lpm with 24/7 [facility]    [provider]  revefenacin (YUPELRI) 175 MCG/3ML nebulizer solution Inhale 3 mLs (175 mcg total) into the lungs every morning. 07/20/21   Nyoka Cowden, MD    Current Facility-Administered Medications  Medication Dose Route Frequency Provider Last Rate  Last Admin   0.9 %  sodium chloride infusion (Manually program via Guardrails IV Fluids)   Intravenous Once Small, Brooke L, PA       Current Outpatient Medications  Medication Sig Dispense Refill   aspirin EC 81 MG tablet Take 1 tablet (81 mg total) by mouth daily. Swallow whole. 90 tablet 3   atorvastatin (LIPITOR) 80 MG tablet Take 80 mg by mouth daily.     azithromycin (ZITHROMAX) 250 MG tablet Take as directed 6 tablet 0   budesonide (PULMICORT) 0.5 MG/2ML nebulizer solution Take 2 mLs (0.5 mg total) by nebulization 2 (two) times daily. 120 mL 11   colchicine 0.6 MG tablet Take 0.6 mg by mouth daily as needed (Gout).     dexlansoprazole (DEXILANT) 60 MG capsule Take 60 mg by mouth daily.     dicyclomine (BENTYL) 10 MG capsule Take 10 mg by mouth 3 (three) times daily before meals.     ezetimibe (ZETIA) 10 MG tablet TAKE 1 TABLET(10 MG) BY MOUTH DAILY 90 tablet 2   ferrous sulfate (FEROSUL) 325 (65 FE) MG tablet TAKE 1 TABLET BY MOUTH DAILY WITH BREAKFAST 90 tablet 3   fiber (NUTRISOURCE FIBER) PACK packet Take 1 packet by mouth 2 (two) times daily.     formoterol (PERFOROMIST) 20 MCG/2ML nebulizer solution Take 40 mcg by nebulization daily.     glimepiride (AMARYL) 4 MG tablet Take 4 mg by mouth daily with breakfast.     levothyroxine (SYNTHROID, LEVOTHROID) 88 MCG tablet Take 88 mcg by mouth daily before breakfast.     metoCLOPramide (REGLAN) 5 MG tablet Take 5 mg by mouth 2 (two) times daily before a meal.     OXYGEN Inhale 4 L into the lungs at bedtime. 4 lpm with 24/7 [facility]     revefenacin (YUPELRI) 175 MCG/3ML nebulizer solution Inhale 3 mLs (175 mcg total) into the lungs every morning. 90 mL 11    Allergies as of 05/29/2023 - Review Complete 05/29/2023  Allergen Reaction Noted   Shellfish allergy Shortness Of Breath, Nausea And Vomiting, and Rash 05/08/2016   Fluzone [influenza virus vaccine] Nausea And Vomiting, Palpitations, and Rash 08/23/2016    Review of Systems:     Constitutional: No weight loss, fever, chills, weakness or fatigue HEENT: Eyes: No change in vision               Ears,  Nose, Throat:  No change in hearing or congestion Skin: No rash or itching Cardiovascular: No chest pain, chest pressure or palpitations   Respiratory: No SOB or cough Gastrointestinal: See HPI and otherwise negative Genitourinary: No dysuria or change in urinary frequency Neurological: No headache, dizziness or syncope Musculoskeletal: No new muscle or joint pain Hematologic: No bleeding or bruising Psychiatric: No history of depression or anxiety     Physical Exam:  Vital signs in last 24 hours: Temp:  [97.9 F (36.6 C)] 97.9 F (36.6 C) (06/26 0715) Pulse Rate:  [48-62] 55 (06/26 1015) Resp:  [16-20] 18 (06/26 1015) BP: (106-114)/(28-58) 110/28 (06/26 1015) SpO2:  [98 %-100 %] 100 % (06/26 1015) Weight:  [88.5 kg] 88.5 kg (06/26 0714)   Last BM recorded by nurses in past 5 days No data recorded  General:   Pleasant, well developed male in no acute distress Head:  Normocephalic and atraumatic. Eyes: sclerae anicteric,conjunctive pale  Heart: Bradycardic, irregular, no murmurs rubs or gallops Pulm: Clear anteriorly; no wheezing, on 2 L nasal cannula Abdomen:  Soft, Obese AB, Sluggish bowel sounds. No tenderness . Without guarding and Without rebound, No organomegaly appreciated. Extremities:  Without edema. Msk:  Symmetrical without gross deformities. Peripheral pulses intact.  Neurologic:  Alert and  oriented x4;  No focal deficits.  Skin:   Dry and intact without significant lesions or rashes. Psychiatric:  Cooperative. Normal mood and affect.  LAB RESULTS: Recent Labs    05/29/23 0810  WBC 6.1  HGB 6.6*  HCT 24.6*  PLT 186   BMET Recent Labs    05/29/23 0810  NA 134*  K 4.1  CL 101  CO2 22  GLUCOSE 190*  BUN 25*  CREATININE 0.83  CALCIUM 8.5*   LFT Recent Labs    05/29/23 0810  PROT 6.3*  ALBUMIN 3.4*  AST 21  ALT 17   ALKPHOS 60  BILITOT 0.6   PT/INR Recent Labs    05/29/23 0810  LABPROT 14.7  INR 1.1    STUDIES: No results found.    Impression    Acute on chronic anemia secondary to chronic GI blood loss Patient's had dark stool but has been on iron since March or April of this year Had thorough workup with [facility] with endoscopy with slight oozing status post clip, colonoscopy with diverticula but no source of GI bleeding Patient is being set up outpatient for capsule endoscopy with [facility] Previous admission CTA suggestive of bleeding of branch of SMA declined IR embolization.  Atrial fibrillation Off anticoagulation secondary to recurrent GI bleeding Getting evaluation tomorrow with Dr. Hilty/Dr. Lalla Brothers for a Watchman device with echocardiogram and labs.  COPD On oxygen  Morbid obesity    Active Problems:   * No active hospital problems. *    LOS: 0 days     Plan   Discussed with patient staying overnight getting capsule endoscopy here tomorrow with our service, patient declined, states he would like to be discharged so he can follow-up with cardiology tomorrow and get echocardiogram and labs.   Discussed with the potentially get those inpatient but patient would not like to stay.   Discussed possibility of continuing GI bleeding, heart attack, stroke, complications from leaving but he would like supportive care and will follow-up outpatient with GI.  He did say he got a call today in order to do capsule endoscopy. Patient is been on iron once daily, uncertain if he is responding to this, will add on  iron, B12 may want to consider IV iron or adding on B12 is low normal in April. Continue supportive care, have H&H greater than 7 Continue proton pump inhibitor 40 mg twice daily ER precautions discussed with patient, can continue supportive care and discharged with close follow-up with cardiology and gastroenterology.  Thank you for your kind consultation, we will  continue to follow.   Doree Albee  05/29/2023, 10:45 AM

## 2023-05-29 NOTE — ED Triage Notes (Signed)
Patient arrives in wheelchair by POV states he was sent by his PCP for hemoglobin 6.9. Patient states he was admitted at [facility] for a few months for GI issues. Over the past month has been following up with his PCP and hemoglobin as steadily dropped. Denies any dark stools or blood in stool or urine. Patient on 4L [place] from home.

## 2023-05-30 ENCOUNTER — Ambulatory Visit (HOSPITAL_COMMUNITY): Payer: Federal, State, Local not specified - PPO

## 2023-05-30 ENCOUNTER — Ambulatory Visit: Payer: Federal, State, Local not specified - PPO

## 2023-05-30 DIAGNOSIS — D649 Anemia, unspecified: Secondary | ICD-10-CM | POA: Diagnosis not present

## 2023-05-30 LAB — BPAM RBC
Blood Product Expiration Date: 202407252359
Blood Product Expiration Date: 202407272359
ISSUE DATE / TIME: 202406261334
ISSUE DATE / TIME: 202406261615
Unit Type and Rh: 5100
Unit Type and Rh: 5100

## 2023-05-30 LAB — TYPE AND SCREEN
ABO/RH(D): O POS
Antibody Screen: POSITIVE
Donor AG Type: NEGATIVE
Donor AG Type: NEGATIVE
Unit division: 0

## 2023-06-03 DIAGNOSIS — K922 Gastrointestinal hemorrhage, unspecified: Secondary | ICD-10-CM | POA: Diagnosis not present

## 2023-06-03 DIAGNOSIS — R0602 Shortness of breath: Secondary | ICD-10-CM | POA: Diagnosis not present

## 2023-06-03 DIAGNOSIS — E8770 Fluid overload, unspecified: Secondary | ICD-10-CM | POA: Diagnosis not present

## 2023-06-03 DIAGNOSIS — E1169 Type 2 diabetes mellitus with other specified complication: Secondary | ICD-10-CM | POA: Diagnosis not present

## 2023-06-05 ENCOUNTER — Other Ambulatory Visit: Payer: Self-pay

## 2023-06-05 ENCOUNTER — Inpatient Hospital Stay (HOSPITAL_COMMUNITY)
Admission: EM | Admit: 2023-06-05 | Discharge: 2023-06-11 | DRG: 291 | Disposition: A | Discharge status: Home / Self Care | Payer: Federal, State, Local not specified - PPO | Attending: Internal Medicine | Admitting: Internal Medicine

## 2023-06-05 ENCOUNTER — Encounter (HOSPITAL_COMMUNITY): Payer: Self-pay | Admitting: Emergency Medicine

## 2023-06-05 ENCOUNTER — Emergency Department (HOSPITAL_COMMUNITY): Payer: Federal, State, Local not specified - PPO

## 2023-06-05 DIAGNOSIS — Z7984 Long term (current) use of oral hypoglycemic drugs: Secondary | ICD-10-CM

## 2023-06-05 DIAGNOSIS — I480 Paroxysmal atrial fibrillation: Secondary | ICD-10-CM | POA: Diagnosis present

## 2023-06-05 DIAGNOSIS — Z7982 Long term (current) use of aspirin: Secondary | ICD-10-CM

## 2023-06-05 DIAGNOSIS — I2510 Atherosclerotic heart disease of native coronary artery without angina pectoris: Secondary | ICD-10-CM | POA: Diagnosis present

## 2023-06-05 DIAGNOSIS — G8929 Other chronic pain: Secondary | ICD-10-CM | POA: Diagnosis present

## 2023-06-05 DIAGNOSIS — E1122 Type 2 diabetes mellitus with diabetic chronic kidney disease: Secondary | ICD-10-CM | POA: Diagnosis not present

## 2023-06-05 DIAGNOSIS — M109 Gout, unspecified: Secondary | ICD-10-CM | POA: Diagnosis present

## 2023-06-05 DIAGNOSIS — I272 Pulmonary hypertension, unspecified: Secondary | ICD-10-CM | POA: Diagnosis not present

## 2023-06-05 DIAGNOSIS — J449 Chronic obstructive pulmonary disease, unspecified: Secondary | ICD-10-CM | POA: Diagnosis not present

## 2023-06-05 DIAGNOSIS — E785 Hyperlipidemia, unspecified: Secondary | ICD-10-CM

## 2023-06-05 DIAGNOSIS — R9431 Abnormal electrocardiogram [ECG] [EKG]: Secondary | ICD-10-CM | POA: Diagnosis present

## 2023-06-05 DIAGNOSIS — Z951 Presence of aortocoronary bypass graft: Secondary | ICD-10-CM | POA: Diagnosis not present

## 2023-06-05 DIAGNOSIS — I5033 Acute on chronic diastolic (congestive) heart failure: Secondary | ICD-10-CM | POA: Diagnosis present

## 2023-06-05 DIAGNOSIS — I441 Atrioventricular block, second degree: Secondary | ICD-10-CM | POA: Diagnosis present

## 2023-06-05 DIAGNOSIS — J9621 Acute and chronic respiratory failure with hypoxia: Secondary | ICD-10-CM | POA: Diagnosis not present

## 2023-06-05 DIAGNOSIS — I25810 Atherosclerosis of coronary artery bypass graft(s) without angina pectoris: Secondary | ICD-10-CM | POA: Diagnosis not present

## 2023-06-05 DIAGNOSIS — R0600 Dyspnea, unspecified: Principal | ICD-10-CM

## 2023-06-05 DIAGNOSIS — I130 Hypertensive heart and chronic kidney disease with heart failure and stage 1 through stage 4 chronic kidney disease, or unspecified chronic kidney disease: Principal | ICD-10-CM | POA: Diagnosis present

## 2023-06-05 DIAGNOSIS — I517 Cardiomegaly: Secondary | ICD-10-CM | POA: Diagnosis not present

## 2023-06-05 DIAGNOSIS — D509 Iron deficiency anemia, unspecified: Secondary | ICD-10-CM | POA: Diagnosis present

## 2023-06-05 DIAGNOSIS — E8770 Fluid overload, unspecified: Secondary | ICD-10-CM | POA: Diagnosis not present

## 2023-06-05 DIAGNOSIS — E1165 Type 2 diabetes mellitus with hyperglycemia: Secondary | ICD-10-CM | POA: Diagnosis present

## 2023-06-05 DIAGNOSIS — I5020 Unspecified systolic (congestive) heart failure: Secondary | ICD-10-CM

## 2023-06-05 DIAGNOSIS — Z8546 Personal history of malignant neoplasm of prostate: Secondary | ICD-10-CM

## 2023-06-05 DIAGNOSIS — I509 Heart failure, unspecified: Secondary | ICD-10-CM | POA: Diagnosis not present

## 2023-06-05 DIAGNOSIS — I10 Essential (primary) hypertension: Secondary | ICD-10-CM | POA: Diagnosis not present

## 2023-06-05 DIAGNOSIS — E039 Hypothyroidism, unspecified: Secondary | ICD-10-CM | POA: Diagnosis present

## 2023-06-05 DIAGNOSIS — D500 Iron deficiency anemia secondary to blood loss (chronic): Secondary | ICD-10-CM

## 2023-06-05 DIAGNOSIS — I110 Hypertensive heart disease with heart failure: Secondary | ICD-10-CM | POA: Diagnosis not present

## 2023-06-05 DIAGNOSIS — Z85038 Personal history of other malignant neoplasm of large intestine: Secondary | ICD-10-CM

## 2023-06-05 DIAGNOSIS — Z820 Family history of epilepsy and other diseases of the nervous system: Secondary | ICD-10-CM

## 2023-06-05 DIAGNOSIS — I4891 Unspecified atrial fibrillation: Secondary | ICD-10-CM

## 2023-06-05 DIAGNOSIS — K922 Gastrointestinal hemorrhage, unspecified: Secondary | ICD-10-CM | POA: Diagnosis not present

## 2023-06-05 DIAGNOSIS — Z7951 Long term (current) use of inhaled steroids: Secondary | ICD-10-CM

## 2023-06-05 DIAGNOSIS — Z6831 Body mass index (BMI) 31.0-31.9, adult: Secondary | ICD-10-CM

## 2023-06-05 DIAGNOSIS — N1831 Chronic kidney disease, stage 3a: Secondary | ICD-10-CM | POA: Diagnosis not present

## 2023-06-05 DIAGNOSIS — G4733 Obstructive sleep apnea (adult) (pediatric): Secondary | ICD-10-CM | POA: Diagnosis present

## 2023-06-05 DIAGNOSIS — Z9981 Dependence on supplemental oxygen: Secondary | ICD-10-CM

## 2023-06-05 DIAGNOSIS — K219 Gastro-esophageal reflux disease without esophagitis: Secondary | ICD-10-CM | POA: Diagnosis present

## 2023-06-05 DIAGNOSIS — Z833 Family history of diabetes mellitus: Secondary | ICD-10-CM

## 2023-06-05 DIAGNOSIS — R0602 Shortness of breath: Secondary | ICD-10-CM | POA: Diagnosis not present

## 2023-06-05 DIAGNOSIS — Z888 Allergy status to other drugs, medicaments and biological substances status: Secondary | ICD-10-CM

## 2023-06-05 DIAGNOSIS — Z79899 Other long term (current) drug therapy: Secondary | ICD-10-CM

## 2023-06-05 DIAGNOSIS — I083 Combined rheumatic disorders of mitral, aortic and tricuspid valves: Secondary | ICD-10-CM | POA: Diagnosis present

## 2023-06-05 DIAGNOSIS — R918 Other nonspecific abnormal finding of lung field: Secondary | ICD-10-CM | POA: Diagnosis not present

## 2023-06-05 DIAGNOSIS — I5031 Acute diastolic (congestive) heart failure: Secondary | ICD-10-CM | POA: Diagnosis not present

## 2023-06-05 DIAGNOSIS — Z87891 Personal history of nicotine dependence: Secondary | ICD-10-CM

## 2023-06-05 DIAGNOSIS — Z79890 Hormone replacement therapy (postmenopausal): Secondary | ICD-10-CM

## 2023-06-05 DIAGNOSIS — I5032 Chronic diastolic (congestive) heart failure: Secondary | ICD-10-CM | POA: Insufficient documentation

## 2023-06-05 DIAGNOSIS — E7800 Pure hypercholesterolemia, unspecified: Secondary | ICD-10-CM | POA: Diagnosis present

## 2023-06-05 DIAGNOSIS — Z86718 Personal history of other venous thrombosis and embolism: Secondary | ICD-10-CM

## 2023-06-05 DIAGNOSIS — I4819 Other persistent atrial fibrillation: Secondary | ICD-10-CM | POA: Diagnosis present

## 2023-06-05 DIAGNOSIS — Z91013 Allergy to seafood: Secondary | ICD-10-CM

## 2023-06-05 DIAGNOSIS — I252 Old myocardial infarction: Secondary | ICD-10-CM

## 2023-06-05 DIAGNOSIS — Z8679 Personal history of other diseases of the circulatory system: Secondary | ICD-10-CM

## 2023-06-05 DIAGNOSIS — D649 Anemia, unspecified: Secondary | ICD-10-CM | POA: Diagnosis not present

## 2023-06-05 DIAGNOSIS — E669 Obesity, unspecified: Secondary | ICD-10-CM | POA: Diagnosis present

## 2023-06-05 HISTORY — DX: Acute pulmonary edema: J81.0

## 2023-06-05 LAB — TROPONIN I (HIGH SENSITIVITY)
Troponin I (High Sensitivity): 24 ng/L — ABNORMAL HIGH (ref <18)
Troponin I (High Sensitivity): 27 ng/L — ABNORMAL HIGH (ref <18)

## 2023-06-05 LAB — CBC WITH DIFFERENTIAL/PLATELET
Abs Immature Granulocytes: 0.01 10*3/uL (ref 0.00–0.07)
Basophils Absolute: 0 10*3/uL (ref 0.0–0.1)
Basophils Relative: 1 %
Eosinophils Absolute: 0.1 10*3/uL (ref 0.0–0.5)
Eosinophils Relative: 3 %
HCT: 27 % — ABNORMAL LOW (ref 39.0–52.0)
Hemoglobin: 7.4 g/dL — ABNORMAL LOW (ref 13.0–17.0)
Immature Granulocytes: 0 %
Lymphocytes Relative: 19 %
Lymphs Abs: 0.8 10*3/uL (ref 0.7–4.0)
MCH: 25.5 pg — ABNORMAL LOW (ref 26.0–34.0)
MCHC: 27.4 g/dL — ABNORMAL LOW (ref 30.0–36.0)
MCV: 93.1 fL (ref 80.0–100.0)
Monocytes Absolute: 0.5 10*3/uL (ref 0.1–1.0)
Monocytes Relative: 12 %
Neutro Abs: 2.8 10*3/uL (ref 1.7–7.7)
Neutrophils Relative %: 65 %
Platelets: 163 10*3/uL (ref 150–400)
RBC: 2.9 MIL/uL — ABNORMAL LOW (ref 4.22–5.81)
RDW: 15.9 % — ABNORMAL HIGH (ref 11.5–15.5)
WBC: 4.3 10*3/uL (ref 4.0–10.5)
nRBC: 0 % (ref 0.0–0.2)

## 2023-06-05 LAB — COMPREHENSIVE METABOLIC PANEL
ALT: 19 U/L (ref 0–44)
AST: 18 U/L (ref 15–41)
Albumin: 3 g/dL — ABNORMAL LOW (ref 3.5–5.0)
Alkaline Phosphatase: 55 U/L (ref 38–126)
Anion gap: 12 (ref 5–15)
BUN: 26 mg/dL — ABNORMAL HIGH (ref 8–23)
CO2: 31 mmol/L (ref 22–32)
Calcium: 8.9 mg/dL (ref 8.9–10.3)
Chloride: 98 mmol/L (ref 98–111)
Creatinine, Ser: 0.86 mg/dL (ref 0.61–1.24)
GFR, Estimated: >60 mL/min (ref >60)
Glucose, Bld: 73 mg/dL (ref 70–99)
Potassium: 3.9 mmol/L (ref 3.5–5.1)
Sodium: 141 mmol/L (ref 135–145)
Total Bilirubin: 0.7 mg/dL (ref 0.3–1.2)
Total Protein: 6.6 g/dL (ref 6.5–8.1)

## 2023-06-05 LAB — BRAIN NATRIURETIC PEPTIDE: B Natriuretic Peptide: 273.8 pg/mL — ABNORMAL HIGH (ref 0.0–100.0)

## 2023-06-05 LAB — I-STAT CHEM 8, ED
BUN: 26 mg/dL — ABNORMAL HIGH (ref 8–23)
Calcium, Ion: 1.15 mmol/L (ref 1.15–1.40)
Chloride: 99 mmol/L (ref 98–111)
Creatinine, Ser: 0.9 mg/dL (ref 0.61–1.24)
Glucose, Bld: 73 mg/dL (ref 70–99)
HCT: 25 % — ABNORMAL LOW (ref 39.0–52.0)
Hemoglobin: 8.5 g/dL — ABNORMAL LOW (ref 13.0–17.0)
Potassium: 4 mmol/L (ref 3.5–5.1)
Sodium: 141 mmol/L (ref 135–145)
TCO2: 33 mmol/L — ABNORMAL HIGH (ref 22–32)

## 2023-06-05 LAB — MAGNESIUM: Magnesium: 2.1 mg/dL (ref 1.7–2.4)

## 2023-06-05 LAB — BPAM RBC: Unit Type and Rh: 5100

## 2023-06-05 LAB — TYPE AND SCREEN: Unit division: 0

## 2023-06-05 MED ORDER — LEVOTHYROXINE SODIUM 88 MCG PO TABS
88.0000 ug | ORAL_TABLET | Freq: Every day | ORAL | Status: DC
  Administered 2023-06-06 – 2023-06-11 (×6): 88 ug via ORAL
  Filled 2023-06-05 (×6): qty 1

## 2023-06-05 MED ORDER — INSULIN ASPART 100 UNIT/ML IJ SOLN
0.0000 [IU] | Freq: Three times a day (TID) | INTRAMUSCULAR | Status: DC
  Administered 2023-06-06: 1 [IU] via SUBCUTANEOUS
  Administered 2023-06-06: 2 [IU] via SUBCUTANEOUS
  Administered 2023-06-07: 1 [IU] via SUBCUTANEOUS
  Administered 2023-06-07 – 2023-06-08 (×3): 2 [IU] via SUBCUTANEOUS
  Administered 2023-06-08: 1 [IU] via SUBCUTANEOUS
  Administered 2023-06-09: 2 [IU] via SUBCUTANEOUS
  Administered 2023-06-09: 1 [IU] via SUBCUTANEOUS
  Administered 2023-06-09 – 2023-06-10 (×4): 2 [IU] via SUBCUTANEOUS
  Administered 2023-06-11: 1 [IU] via SUBCUTANEOUS

## 2023-06-05 MED ORDER — ASPIRIN 81 MG PO TBEC
81.0000 mg | DELAYED_RELEASE_TABLET | Freq: Every day | ORAL | Status: DC
  Administered 2023-06-06 – 2023-06-11 (×6): 81 mg via ORAL
  Filled 2023-06-05 (×6): qty 1

## 2023-06-05 MED ORDER — EZETIMIBE 10 MG PO TABS
10.0000 mg | ORAL_TABLET | Freq: Every day | ORAL | Status: DC
  Administered 2023-06-06 – 2023-06-11 (×6): 10 mg via ORAL
  Filled 2023-06-05 (×6): qty 1

## 2023-06-05 MED ORDER — ACETAMINOPHEN 325 MG PO TABS
650.0000 mg | ORAL_TABLET | Freq: Four times a day (QID) | ORAL | Status: DC | PRN
  Administered 2023-06-07 – 2023-06-08 (×3): 650 mg via ORAL
  Filled 2023-06-05 (×3): qty 2

## 2023-06-05 MED ORDER — REVEFENACIN 175 MCG/3ML IN SOLN
175.0000 ug | RESPIRATORY_TRACT | Status: DC
  Administered 2023-06-06 – 2023-06-11 (×6): 175 ug via RESPIRATORY_TRACT
  Filled 2023-06-05 (×6): qty 3

## 2023-06-05 MED ORDER — ACETAMINOPHEN 650 MG RE SUPP: 650.0000 mg | Freq: Four times a day (QID) | RECTAL | Status: DC | PRN

## 2023-06-05 MED ORDER — FUROSEMIDE 10 MG/ML IJ SOLN
40.0000 mg | Freq: Two times a day (BID) | INTRAMUSCULAR | Status: DC
  Administered 2023-06-06 – 2023-06-07 (×3): 40 mg via INTRAVENOUS
  Filled 2023-06-05 (×3): qty 4

## 2023-06-05 MED ORDER — POLYETHYLENE GLYCOL 3350 17 G PO PACK: 17.0000 g | PACK | Freq: Every day | ORAL | Status: DC | PRN

## 2023-06-05 MED ORDER — BUDESONIDE 0.5 MG/2ML IN SUSP
0.5000 mg | Freq: Two times a day (BID) | RESPIRATORY_TRACT | Status: DC
  Administered 2023-06-05 – 2023-06-11 (×12): 0.5 mg via RESPIRATORY_TRACT
  Filled 2023-06-05 (×12): qty 2

## 2023-06-05 MED ORDER — SODIUM CHLORIDE 0.9% FLUSH
3.0000 mL | Freq: Two times a day (BID) | INTRAVENOUS | Status: DC
  Administered 2023-06-05 – 2023-06-11 (×12): 3 mL via INTRAVENOUS

## 2023-06-05 MED ORDER — PANTOPRAZOLE SODIUM 40 MG PO TBEC
40.0000 mg | DELAYED_RELEASE_TABLET | Freq: Every day | ORAL | Status: DC
  Administered 2023-06-06 – 2023-06-11 (×6): 40 mg via ORAL
  Filled 2023-06-05 (×6): qty 1

## 2023-06-05 MED ORDER — ATORVASTATIN CALCIUM 80 MG PO TABS
80.0000 mg | ORAL_TABLET | Freq: Every day | ORAL | Status: DC
  Administered 2023-06-07 – 2023-06-11 (×5): 80 mg via ORAL
  Filled 2023-06-05 (×6): qty 1

## 2023-06-05 MED ORDER — TRIAMCINOLONE ACETONIDE 0.1 % EX CREA
TOPICAL_CREAM | Freq: Three times a day (TID) | CUTANEOUS | Status: DC
  Filled 2023-06-05: qty 15

## 2023-06-05 MED ORDER — DICYCLOMINE HCL 10 MG PO CAPS: 10.0000 mg | ORAL_CAPSULE | Freq: Every day | ORAL | Status: DC | PRN

## 2023-06-05 MED ORDER — FUROSEMIDE 10 MG/ML IJ SOLN
40.0000 mg | Freq: Once | INTRAMUSCULAR | Status: AC
  Administered 2023-06-05: 40 mg via INTRAVENOUS
  Filled 2023-06-05: qty 4

## 2023-06-05 NOTE — ED Provider Notes (Signed)
Jeremiah [name] EMERGENCY DEPARTMENT AT [facility] Provider Note   CSN: [id number] Arrival date & time: 06/05/23  1343     History  Chief Complaint  Patient presents with   Shortness of Breath    Jeremiah [name] is a 77 y.o. male.  77 year old male with prior medical history as detailed below presents for evaluation.  Patient with complaint of increasing shortness of breath over the last 3 to 4 days.  Patient reports having to increase his baseline nasal cannula O2 up to approximate 5 to 6 L.  He reports approximately 5 to 7 pounds of weight gain over the last 2 to 3 days.  Patient reports taking p.o. Lasix without improvement in his symptoms.  Patient was referred to the ED for likely admission by his PCP today.  Concern from PCP for likely fluid overload.  The history is provided by the patient and medical records.       Home Medications Prior to Admission medications   Medication Sig Start Date End Date Taking? Authorizing Provider  aspirin EC 81 MG tablet Take 1 tablet (81 mg total) by mouth daily. Swallow whole. 04/17/23   Orbie Pyo, MD  atorvastatin (LIPITOR) 80 MG tablet Take 80 mg by mouth daily.    [provider]  azithromycin (ZITHROMAX) 250 MG tablet Take as directed Patient not taking: Reported on 05/29/2023 05/13/23   Martina Sinner, MD  budesonide (PULMICORT) 0.5 MG/2ML nebulizer solution Take 2 mLs (0.5 mg total) by nebulization 2 (two) times daily. 05/13/23   Martina Sinner, MD  cetirizine (ZYRTEC) 10 MG tablet Take 10 mg by mouth daily as needed for allergies.    [provider]  colchicine 0.6 MG tablet Take 0.6 mg by mouth daily as needed (Gout).    [provider]  dexlansoprazole (DEXILANT) 60 MG capsule Take 60 mg by mouth daily. 01/29/23   [provider]  dicyclomine (BENTYL) 10 MG capsule Take 10 mg by mouth daily as needed for spasms. 02/19/23   [provider]  ezetimibe (ZETIA) 10 MG tablet TAKE  1 TABLET(10 MG) BY MOUTH DAILY 07/16/22   Hilty, Lisette Abu, MD  ferrous sulfate (FEROSUL) 325 (65 FE) MG tablet TAKE 1 TABLET BY MOUTH DAILY WITH BREAKFAST Patient taking differently: Take 325 mg by mouth daily with breakfast. 05/03/22   Hilty, Lisette Abu, MD  glimepiride (AMARYL) 4 MG tablet Take 4 mg by mouth daily as needed (If blood sugar is over 160).    [provider]  levothyroxine (SYNTHROID, LEVOTHROID) 88 MCG tablet Take 88 mcg by mouth daily before breakfast.    [provider]  metoCLOPramide (REGLAN) 5 MG tablet Take 5 mg by mouth every 8 (eight) hours as needed for nausea or vomiting. 12/21/22   [provider]  OXYGEN Inhale 4 L into the lungs at bedtime. 4 lpm with 24/7 [facility]    [provider]  revefenacin (YUPELRI) 175 MCG/3ML nebulizer solution Inhale 3 mLs (175 mcg total) into the lungs every morning. 07/20/21   Nyoka Cowden, MD      Allergies    Shellfish allergy and Fluzone [influenza virus vaccine]    Review of Systems   Review of Systems  All other systems reviewed and are negative.   Physical Exam Updated Vital Signs BP 110/73   Pulse (!) 51   Temp 97.8 F (36.6 C) (Oral)   Resp 17   Ht 5\' 8"  (1.727 m)   Wt 93.9  kg   SpO2 96%   BMI 31.47 kg/m  Physical Exam Vitals and nursing note reviewed.  Constitutional:      General: He is not in acute distress.    Appearance: Normal appearance. He is well-developed.  HENT:     Head: Normocephalic and atraumatic.  Eyes:     Conjunctiva/sclera: Conjunctivae normal.     Pupils: Pupils are equal, round, and reactive to light.  Cardiovascular:     Rate and Rhythm: Normal rate and regular rhythm.     Heart sounds: Normal heart sounds.  Pulmonary:     Effort: Pulmonary effort is normal. No respiratory distress.     Breath sounds: Examination of the right-lower field reveals decreased breath sounds. Examination of the left-lower field reveals decreased breath sounds. Decreased breath  sounds present.  Abdominal:     General: There is no distension.     Palpations: Abdomen is soft.     Tenderness: There is no abdominal tenderness.  Musculoskeletal:        General: No deformity. Normal range of motion.     Cervical back: Normal range of motion and neck supple.     Right lower leg: No edema.     Left lower leg: Edema present.  Skin:    General: Skin is warm and dry.  Neurological:     General: No focal deficit present.     Mental Status: He is alert and oriented to person, place, and time.     ED Results / Procedures / Treatments   Labs (all labs ordered are listed, but only abnormal results are displayed) Labs Reviewed  I-STAT CHEM 8, ED - Abnormal; Notable for the following components:      Result Value   BUN 26 (*)    TCO2 33 (*)    Hemoglobin 8.5 (*)    HCT 25.0 (*)    All other components within normal limits  CBC WITH DIFFERENTIAL/PLATELET  BRAIN NATRIURETIC PEPTIDE  COMPREHENSIVE METABOLIC PANEL  TYPE AND SCREEN  TROPONIN I (HIGH SENSITIVITY)    EKG EKG Interpretation Date/Time:  Wednesday June 05 2023 15:10:05 EDT Ventricular Rate:  62 PR Interval:  209 QRS Duration:  94 QT Interval:  444 QTC Calculation: 451 R Axis:   55  Text Interpretation: Sinus arrhythmia Borderline T abnormalities, inferior leads Confirmed by Kristine Royal [id number]) on 06/05/2023 4:02:04 PM  Radiology DG Chest Port 1 View  Result Date: 06/05/2023 CLINICAL DATA:  Shortness of breath. EXAM: PORTABLE CHEST 1 VIEW COMPARISON:  Chest radiograph 12/25/2018. FINDINGS: Mild interstitial prominence. No confluent consolidation. No visible pneumothorax. Possible small left pleural effusion. Cardiomegaly. Median sternotomy. Pulmonary arterial enlargement. IMPRESSION: 1. Cardiomegaly and mild interstitial prominence, which could represent the sequela of recurrent bouts of CHF or mild interstitial edema. No confluent consolidation. 2. Possible small left pleural effusion. Electronically  Signed   By: Feliberto Harts M.D.   On: 06/05/2023 15:22    Procedures Procedures    Medications Ordered in ED Medications - No data to display  ED Course/ Medical Decision Making/ A&P Clinical Course as of 06/05/23 1602  Wed Jun 05, 2023  1550 ED EKG [PM]    Clinical Course User Index [PM] Wynetta Fines, MD                             Medical Decision Making Amount and/or Complexity of Data Reviewed Labs: ordered. Radiology: ordered. ECG/medicine tests:  Decision-making details  documented in ED Course.  Risk Prescription drug management.    Medical Screen Complete  This patient presented to the ED with complaint of shortness of breath, concern for CHF exacerbation.  This complaint involves an extensive number of treatment options. The initial differential diagnosis includes, but is not limited to, CHF exacerbation, metabolic abnormality, pneumonia, etc.  This presentation is: Acute, Self-Limited, Previously Undiagnosed, Uncertain Prognosis, Complicated, Systemic Symptoms, and Threat to Life/Bodily Function  Patient is reporting increased shortness of breath, increased O2 demand, edema, weight gain.  Patient's symptoms are not improved with p.o. Lasix over the last 2 days.  Describes symptoms are consistent with likely CHF exacerbation.  Patient will require admission.  Oncoming EDP Dr. Estell Harpin aware of case and need for disposition.    Additional history obtained: External records from outside sources obtained and reviewed including prior ED visits and prior Inpatient records.    Lab Tests:  I ordered and personally interpreted labs.    Imaging Studies ordered:  I ordered imaging studies including chest x-ray I independently visualized and interpreted obtained imaging which showed likely CHF I agree with the radiologist interpretation.   Cardiac Monitoring:  The patient was maintained on a cardiac monitor.  I personally viewed and interpreted the  cardiac monitor which showed an underlying rhythm of: NSR   Problem List / ED Course:  Shortness of breath, suspected CHF exacerbation   Reevaluation:  After the interventions noted above, I reevaluated the patient and found that they have: improved   Disposition:  After consideration of the diagnostic results and the patients response to treatment, I feel that the patent would benefit from admission.          Final Clinical Impression(s) / ED Diagnoses Final diagnoses:  Dyspnea, unspecified type    Rx / DC Orders ED Discharge Orders     None         Wynetta Fines, MD 06/05/23 1649

## 2023-06-05 NOTE — ED Provider Notes (Signed)
Patient has a history of coronary artery disease with bypass surgery, diabetes, COPD, GI bleed, congestive heart failure.  Patient complains of shortness of breath and 7 pound weight gain over a few days.  Patient appears to be in congestive heart failure now.  He is slightly more anemic than it was last week.  I spoke with Dr. Clifton James of cardiology and he agrees with IV Lasix and admission to the hospitalist with cardiology seeing the patient tomorrow   Bethann Berkshire, MD 06/05/23 1859

## 2023-06-05 NOTE — H&P (Signed)
History and Physical   Jeremiah [name] ZOX:[id number] DOB: June 06, 1946 DOA: 06/05/2023  PCP: Jeremiah Bussing, MD   Patient coming from: Home/PCP  Chief Complaint: Shortness of breath  HPI: Jeremiah [name] is a 77 y.o. male with medical history significant of diabetes, CKD 3A, anemia, hypothyroidism, hyperlipidemia, hypertension, COPD, chronic respiratory failure with hypoxia, chronic diastolic CHF, atrial fibrillation, CAD status post CABG, pulmonary hypertension, prolonged QTc, OSA, obesity presenting with worsening shortness of breath.  Patient reports 3 to 4 days of worsening shortness of breath.  At baseline he uses 2 to 4 L of nasal cannula and has had to increase this to 5 to 6 L.  Reports 5 to 7 pound weight gain in the last 3 days and has been taking his p.o. Lasix without improvement.  Saw his PCP today and recommended ED evaluation for possible admission for presumed CHF exacerbation.  Patient also notably was seen for GI bleeding at the end of last month and declined admission as [facility] is [facility].  Did receive transfusion with hemoglobin status posttransfusion of 8.0.  Blood thinner has been held.  Patient denies chest pain, abdominal pain, constipation, diarrhea, nausea, vomiting.  ED Course: Vital signs in the ED notable for blood pressure in the 100s to 120 systolic, respiratory rate in the teens to 20s.  Requiring 4 to 6 L to maintain saturations.  Lab workup included CMP with BUN 26, albumin 3.0.  CBC with hemoglobin of 7.4 down 0.6 from 7 days ago.  Troponin 24 with repeat pending.  BNP 273.  Patient typed and screened in the ED.  Chest x-ray with cardiomegaly and suspected edema.  Patient received 40 mg IV Lasix in the ED.  Cardiology consulted and will see the patient in the morning.  Review of Systems: As per HPI otherwise all other systems reviewed and are negative.  Past Medical History:  Diagnosis Date   A-fib [facility])    Acute on chronic systolic  (congestive) heart failure (HCC) 06/10/2017   Acute pulmonary edema (HCC)    Anemia    Aortic valve sclerosis 12/2018   Noted on ECHO   Arthritis    CAD (coronary artery disease)    a. 2006: CABG in 2006 with LIMA-LAD, SVG-OM1, and SVG-RPDA   Cardiomegaly 12/2018   Stable, noted on CXR   Carpal tunnel syndrome    Right   Chronic pain 03/21/2016   Colon cancer (HCC) 2006   COPD (chronic obstructive pulmonary disease) (HCC)    Diabetes mellitus without complication (HCC)    DVT (deep venous thrombosis) (HCC)    Right   Essential hypertension 03/21/2016   GERD (gastroesophageal reflux disease)    History of blood transfusion    History of Clostridioides difficile infection    History of prostate cancer 03/21/2016   History of PSVT (paroxysmal supraventricular tachycardia)    HLD (hyperlipidemia)    HTN (hypertension)    Hx of CABG 2006   Hypercholesteremia 03/21/2016   Hypothyroidism    LAE (left atrial enlargement) 12/2018   Severe, Noted on ECHO   LVH (left ventricular hypertrophy) 12/2018   Mild, Noted on ECHO   Medication management 03/21/2016   Mitral annular calcification 12/2018   with mild MS, Noted on ECHO   Morbid obesity (HCC) 03/21/2016   Myocardial infarct (HCC)    OSA (obstructive sleep apnea) 03/21/2016   uses oxygen at night time   Pain in right ankle and joints of right foot 03/21/2016  Paronychia 03/21/2016   Phimosis    Pneumonia    Primary insomnia 03/21/2016   Prostate cancer (HCC) 2008   Pulmonary hypertension (HCC) 12/2018   Moderate, Noted on ECHO   Tobacco dependence 03/21/2016   Tricuspid regurgitation 12/2018   Mild, Noted on ECHO    Past Surgical History:  Procedure Laterality Date   ANKLE SURGERY Right 12/2013   CARDIAC CATHETERIZATION N/A 05/08/2016   Procedure: Left Heart Cath and Cors/Grafts Angiography;  Surgeon: Lyn Records, MD;  Location: [facility] INVASIVE CV LAB;  Service: Cardiovascular;  Laterality: N/A;   CIRCUMCISION N/A  10/07/2019   Procedure: CIRCUMCISION ADULT;  Surgeon: Crist Fat, MD;  Location: [facility] ORS;  Service: Urology;  Laterality: N/A;   CORONARY ARTERY BYPASS GRAFT  2006   x3   ELECTROPHYSIOLOGIC STUDY N/A 08/24/2016   Procedure: SVT Ablation;  Surgeon: Will Jorja Loa, MD;  Location: [facility] INVASIVE CV LAB;  Service: Cardiovascular;  Laterality: N/A;   PROSTATE SURGERY  2008   PTCA     UMBILICAL HERNIA REPAIR N/A 05/26/2022   Procedure: PRIMARY REPAIR OF STRANGULATED UMBILICAL HERNIA WITH PARTIAL OMENTECTOMY;  Surgeon: Almond Lint, MD;  Location: [facility] OR;  Service: General;  Laterality: N/A;    Social History  reports that he quit smoking about 6 years ago. His smoking use included cigarettes. He has a 45.00 pack-year smoking history. He has never used smokeless tobacco. He reports that he does not currently use alcohol. He reports that he does not use drugs.  Allergies  Allergen Reactions   Shellfish Allergy Shortness Of Breath, Nausea And Vomiting and Rash    scallops     Fluzone [Influenza Virus Vaccine] Nausea And Vomiting, Palpitations and Rash    Family History  Problem Relation Age of Onset   Dementia Mother    Diabetes Sister   Reviewed on admission  Prior to Admission medications   Medication Sig Start Date End Date Taking? Authorizing Provider  aspirin EC 81 MG tablet Take 1 tablet (81 mg total) by mouth daily. Swallow whole. 04/17/23   Orbie Pyo, MD  atorvastatin (LIPITOR) 80 MG tablet Take 80 mg by mouth daily.    [provider]  budesonide (PULMICORT) 0.5 MG/2ML nebulizer solution Take 2 mLs (0.5 mg total) by nebulization 2 (two) times daily. 05/13/23   Martina Sinner, MD  cetirizine (ZYRTEC) 10 MG tablet Take 10 mg by mouth daily as needed for allergies.    [provider]  colchicine 0.6 MG tablet Take 0.6 mg by mouth daily as needed (Gout).    [provider]  dexlansoprazole (DEXILANT) 60 MG capsule Take 60 mg by mouth daily.  01/29/23   [provider]  dicyclomine (BENTYL) 10 MG capsule Take 10 mg by mouth daily as needed for spasms. 02/19/23   [provider]  ezetimibe (ZETIA) 10 MG tablet TAKE 1 TABLET(10 MG) BY MOUTH DAILY 07/16/22   Hilty, Lisette Abu, MD  ferrous sulfate (FEROSUL) 325 (65 FE) MG tablet TAKE 1 TABLET BY MOUTH DAILY WITH BREAKFAST Patient taking differently: Take 325 mg by mouth daily with breakfast. 05/03/22   Hilty, Lisette Abu, MD  glimepiride (AMARYL) 4 MG tablet Take 4 mg by mouth daily as needed (If blood sugar is over 160).    [provider]  levothyroxine (SYNTHROID, LEVOTHROID) 88 MCG tablet Take 88 mcg by mouth daily before breakfast.    [provider]  metoCLOPramide (REGLAN) 5 MG tablet Take 5 mg by mouth  every 8 (eight) hours as needed for nausea or vomiting. 12/21/22   [provider]  OXYGEN Inhale 4 L into the lungs at bedtime. 4 lpm with 24/7 [facility]    [provider]  revefenacin (YUPELRI) 175 MCG/3ML nebulizer solution Inhale 3 mLs (175 mcg total) into the lungs every morning. 07/20/21   Nyoka Cowden, MD    Physical Exam: Vitals:   06/05/23 1730 06/05/23 1745 06/05/23 1800 06/05/23 1801  BP: 126/67 (!) 116/50    Pulse: (!) 49 (!) 25 (!) 47   Resp: (!) 29 (!) 25 (!) 26   Temp:    97.8 F (36.6 C)  TempSrc:    Oral  SpO2: 99% 96% 98%   Weight:      Height:        Physical Exam Constitutional:      General: He is not in acute distress.    Appearance: Normal appearance.  HENT:     Head: Normocephalic and atraumatic.     Mouth/Throat:     Mouth: Mucous membranes are moist.     Pharynx: Oropharynx is clear.  Eyes:     Extraocular Movements: Extraocular movements intact.     Pupils: Pupils are equal, round, and reactive to light.  Cardiovascular:     Rate and Rhythm: Normal rate and regular rhythm.     Pulses: Normal pulses.     Heart sounds: Normal heart sounds.  Pulmonary:     Effort: Pulmonary effort is normal. No  respiratory distress.     Breath sounds: Wheezing (trace) and rales (trace) present.  Abdominal:     General: Bowel sounds are normal. There is no distension.     Palpations: Abdomen is soft.     Tenderness: There is no abdominal tenderness.  Musculoskeletal:        General: No swelling or deformity.     Right lower leg: Edema present.     Left lower leg: Edema present.  Skin:    General: Skin is warm and dry.  Neurological:     General: No focal deficit present.     Mental Status: Mental status is at baseline.    Labs on Admission: I have personally reviewed following labs and imaging studies  CBC: Recent Labs  Lab 05/29/23 2113 06/05/23 1518 06/05/23 1549  WBC 5.9 4.3  --   NEUTROABS  --  2.8  --   HGB 8.0* 7.4* 8.5*  HCT 28.1* 27.0* 25.0*  MCV 89.5 93.1  --   PLT 181 163  --     Basic Metabolic Panel: Recent Labs  Lab 06/05/23 1518 06/05/23 1549  NA 141 141  K 3.9 4.0  CL 98 99  CO2 31  --   GLUCOSE 73 73  BUN 26* 26*  CREATININE 0.86 0.90  CALCIUM 8.9  --     GFR: Estimated Creatinine Clearance: 77.6 mL/min (by C-G formula based on SCr of 0.9 mg/dL).  Liver Function Tests: Recent Labs  Lab 06/05/23 1518  AST 18  ALT 19  ALKPHOS 55  BILITOT 0.7  PROT 6.6  ALBUMIN 3.0*    Urine analysis:    Component Value Date/Time   COLORURINE YELLOW 02/05/2023 2342   APPEARANCEUR CLEAR 02/05/2023 2342   LABSPEC 1.043 (H) 02/05/2023 2342   PHURINE 6.0 02/05/2023 2342   GLUCOSEU >=500 (A) 02/05/2023 2342   HGBUR NEGATIVE 02/05/2023 2342   BILIRUBINUR NEGATIVE 02/05/2023 2342   KETONESUR 20 (A) 02/05/2023 2342   PROTEINUR NEGATIVE  02/05/2023 2342   NITRITE NEGATIVE 02/05/2023 2342   LEUKOCYTESUR NEGATIVE 02/05/2023 2342    Radiological Exams on Admission: DG Chest Port 1 View  Result Date: 06/05/2023 CLINICAL DATA:  Shortness of breath. EXAM: PORTABLE CHEST 1 VIEW COMPARISON:  Chest radiograph 12/25/2018. FINDINGS: Mild interstitial prominence. No  confluent consolidation. No visible pneumothorax. Possible small left pleural effusion. Cardiomegaly. Median sternotomy. Pulmonary arterial enlargement. IMPRESSION: 1. Cardiomegaly and mild interstitial prominence, which could represent the sequela of recurrent bouts of CHF or mild interstitial edema. No confluent consolidation. 2. Possible small left pleural effusion. Electronically Signed   By: Feliberto Harts M.D.   On: 06/05/2023 15:22    EKG: Independently reviewed.  Sinus rhythm with second-degree AV block type I at 60 bpm.  Nonspecific T wave changes.  Assessment/Plan Principal Problem:   Acute on chronic diastolic CHF (congestive heart failure) (HCC) Active Problems:   Essential hypertension   Type 2 diabetes mellitus with hyperglycemia, without long-term current use of insulin (HCC)   S/P CABG x 3   COPD GOLD II with reversibility   Hypothyroidism, acquired   Hyperlipidemia LDL goal <70   History of supraventricular tachycardia   Morbid obesity due to excess calories (HCC) complicated by hbp/hyperlipidemia    OSA (obstructive sleep apnea)   Prolonged QT interval   Coronary artery disease involving coronary bypass graft of native heart without angina pectoris   Iron deficiency anemia   Pulmonary hypertension, unspecified (HCC)   New onset atrial fibrillation (HCC)   Chronic diastolic (congestive) heart failure (HCC)   Acute on chronic respiratory failure with hypoxia (HCC)   Acute on chronic respiratory failure with hypoxia Acute on chronic diastolic CHF > Presenting with 5 to 7 pound weight gain, worsening shortness of breath, increased oxygen demand from 2-4 to 5-6 liters. > BNP mildly elevated at 273 in the ED.  Troponin 24 with repeat pending. > Chest x-ray with cardiomegaly and edema. > Anemia could be playing a role as below. > Received IV Lasix in the ED.  Cardiology consulted and will see in the morning. - Monitor on telemetry overnight - Continue with 40 mg IV  Lasix twice daily - Strict I's and O's, daily weights - Hold off on echocardiogram as this was recently done - Check magnesium - Trend renal function and electrolytes  Anemia GI bleeding > Was seen for ongoing chronic GI bleeding in the ED with hemoglobin of 6.6 at the end of last month. > Declined admission as he is being worked [facility].  Was transfused to hemoglobin 8.0 at discharge. > Eliquis has been held.  Hemoglobin now 7.4 likely delusional component. > This fails to improve back to 8 May benefit from additional transfusion.  Has been typed and screened in the ED. - Trend CBC  COPD - Continue home Yupelri and Pulmicort  OSA - Continue home BiPAP nightly  Diabetes - SSI  CKD 2 > Listed as CKD 3A in chart however creatinine clearance currently at 77.6 consistent with CKD 2. - Trend renal function and electrolytes  Hypothyroidism - Continue home Synthroid  Hyperlipidemia CAD > Status post CABG - Continue home atorvastatin, Zetia - Continue home aspirin  Hypertension - Diuresis as above   Atrial fibrillation > Rhythm currently second-degree AV block type I. > Off anticoagulation due to GI bleeding as above. - Not currently on any rate or rhythm controlling medications  Obesity - Noted  DVT prophylaxis: SCDs for now Code Status:   Full Family  Communication:  Updated at bedside  Disposition Plan:   Patient is from:  Home  Anticipated DC to:  Home  Anticipated DC date:  1 to 2 days  Anticipated DC barriers: None  Consults called:  Cardiology, will see in the morning. Admission status:  Observation, telemetry  Severity of Illness: The appropriate patient status for this patient is OBSERVATION. Observation status is judged to be reasonable and necessary in order to provide the required intensity of service to ensure the patient's safety. The patient's presenting symptoms, physical exam findings, and initial radiographic and laboratory data  in the context of their medical condition is felt to place them at decreased risk for further clinical deterioration. Furthermore, it is anticipated that the patient will be medically stable for discharge from the hospital within 2 midnights of admission.    Synetta Fail MD [facility]  How to contact the [facility] Attending or Consulting provider 7A - 7P or covering provider during after hours 7P -7A, for this patient?   Check the care team in [facility] and look for a) attending/consulting [facility] provider listed and b) the [facility] team listed Log into [email or web address] and use [place]'s universal password to access. If you do not have the password, please contact the hospital operator. Locate the [facility] provider you are looking for under [facility] and page to a number that you can be directly reached. If you still have difficulty reaching the provider, please page the [facility] (Director on Call) for the Hospitalists listed on amion for assistance.  06/05/2023, 6:59 PM

## 2023-06-05 NOTE — ED Notes (Signed)
Pt reports he is feeling much better after urinating  twice.

## 2023-06-05 NOTE — ED Notes (Signed)
ED TO INPATIENT HANDOFF REPORT  ED Nurse Name and Phone #: Morrie Sheldon [phone]   S Name/Age/Gender Jeremiah [name] 77 y.o. male Room/Bed: 006C/006C  Code Status   Code Status: Full Code  Home/SNF/Other Home Patient oriented to: self, place, time, and situation Is this baseline? Yes   Triage Complete: Triage complete  Chief Complaint Acute on chronic diastolic CHF (congestive heart failure) (HCC) [I50.33]  Triage Note Patient arrives in wheelchair by POV states sent by his PCP for shortness of breath and weight gain of 5 lbs in two days. On 4L [place] at home. Has been taking lasix at home.    Allergies Allergies  Allergen Reactions   Shellfish Allergy Shortness Of Breath, Nausea And Vomiting and Rash    scallops     Fluzone [Influenza Virus Vaccine] Nausea And Vomiting, Palpitations and Rash    Level of Care/Admitting Diagnosis ED Disposition     ED Disposition  Admit   Condition  --   Comment  Hospital Area: [facility] [[id number]]  Level of Care: Telemetry Cardiac [[id number]]  May place patient in observation at [facility] or [facility] if equivalent level of care is available:: No  Covid Evaluation: Asymptomatic - no recent exposure (last 10 days) testing not required  Diagnosis: Acute on chronic diastolic CHF (congestive heart failure) [facility]) [[id number]]  Admitting Physician: Synetta Fail [[id number]]  Attending Physician: Synetta Fail [[id number]]          B Medical/Surgery History Past Medical History:  Diagnosis Date   A-fib (HCC)    Acute on chronic systolic (congestive) heart failure (HCC) 06/10/2017   Acute pulmonary edema (HCC)    Anemia    Aortic valve sclerosis 12/2018   Noted on ECHO   Arthritis    CAD (coronary artery disease)    a. 2006: CABG in 2006 with LIMA-LAD, SVG-OM1, and SVG-RPDA   Cardiomegaly 12/2018   Stable, noted on CXR   Carpal tunnel syndrome    Right   Chronic pain 03/21/2016   Colon cancer (HCC) 2006   COPD  (chronic obstructive pulmonary disease) (HCC)    Diabetes mellitus without complication (HCC)    DVT (deep venous thrombosis) (HCC)    Right   Essential hypertension 03/21/2016   GERD (gastroesophageal reflux disease)    History of blood transfusion    History of Clostridioides difficile infection    History of prostate cancer 03/21/2016   History of PSVT (paroxysmal supraventricular tachycardia)    HLD (hyperlipidemia)    HTN (hypertension)    Hx of CABG 2006   Hypercholesteremia 03/21/2016   Hypothyroidism    LAE (left atrial enlargement) 12/2018   Severe, Noted on ECHO   LVH (left ventricular hypertrophy) 12/2018   Mild, Noted on ECHO   Medication management 03/21/2016   Mitral annular calcification 12/2018   with mild MS, Noted on ECHO   Morbid obesity (HCC) 03/21/2016   Myocardial infarct (HCC)    OSA (obstructive sleep apnea) 03/21/2016   uses oxygen at night time   Pain in right ankle and joints of right foot 03/21/2016   Paronychia 03/21/2016   Phimosis    Pneumonia    Primary insomnia 03/21/2016   Prostate cancer (HCC) 2008   Pulmonary hypertension (HCC) 12/2018   Moderate, Noted on ECHO   Tobacco dependence 03/21/2016   Tricuspid regurgitation 12/2018   Mild, Noted on ECHO   Past Surgical History:  Procedure Laterality Date   ANKLE SURGERY Right 12/2013  CARDIAC CATHETERIZATION N/A 05/08/2016   Procedure: Left Heart Cath and Cors/Grafts Angiography;  Surgeon: Lyn Records, MD;  Location: [facility] INVASIVE CV LAB;  Service: Cardiovascular;  Laterality: N/A;   CIRCUMCISION N/A 10/07/2019   Procedure: CIRCUMCISION ADULT;  Surgeon: Crist Fat, MD;  Location: [facility] ORS;  Service: Urology;  Laterality: N/A;   CORONARY ARTERY BYPASS GRAFT  2006   x3   ELECTROPHYSIOLOGIC STUDY N/A 08/24/2016   Procedure: SVT Ablation;  Surgeon: Will Jorja Loa, MD;  Location: [facility] INVASIVE CV LAB;  Service: Cardiovascular;  Laterality: N/A;   PROSTATE SURGERY  2008   PTCA      UMBILICAL HERNIA REPAIR N/A 05/26/2022   Procedure: PRIMARY REPAIR OF STRANGULATED UMBILICAL HERNIA WITH PARTIAL OMENTECTOMY;  Surgeon: Almond Lint, MD;  Location: [facility] OR;  Service: General;  Laterality: N/A;     A IV Location/Drains/Wounds Patient Lines/Drains/Airways Status     Active Line/Drains/Airways     Name Placement date Placement time Site Days   Peripheral IV 06/05/23 20 G Anterior;Right Forearm 06/05/23  1518  Forearm  less than 1            Intake/Output Last 24 hours No intake or output data in the 24 hours ending 06/05/23 1917  Labs/Imaging Results for orders placed or performed during the hospital encounter of 06/05/23 (from the past 48 hour(s))  CBC with Differential     Status: Abnormal   Collection Time: 06/05/23  3:18 PM  Result Value Ref Range   WBC 4.3 4.0 - 10.5 K/uL   RBC 2.90 (L) 4.22 - 5.81 MIL/uL   Hemoglobin 7.4 (L) 13.0 - 17.0 g/dL   HCT 16.1 (L) 09.6 - 04.5 %   MCV 93.1 80.0 - 100.0 fL   MCH 25.5 (L) 26.0 - 34.0 pg   MCHC 27.4 (L) 30.0 - 36.0 g/dL   RDW 40.9 (H) 81.1 - 91.4 %   Platelets 163 150 - 400 K/uL   nRBC 0.0 0.0 - 0.2 %   Neutrophils Relative % 65 %   Neutro Abs 2.8 1.7 - 7.7 K/uL   Lymphocytes Relative 19 %   Lymphs Abs 0.8 0.7 - 4.0 K/uL   Monocytes Relative 12 %   Monocytes Absolute 0.5 0.1 - 1.0 K/uL   Eosinophils Relative 3 %   Eosinophils Absolute 0.1 0.0 - 0.5 K/uL   Basophils Relative 1 %   Basophils Absolute 0.0 0.0 - 0.1 K/uL   Immature Granulocytes 0 %   Abs Immature Granulocytes 0.01 0.00 - 0.07 K/uL    Comment: Performed at [facility], [address], [place], [place] [address]  Comprehensive metabolic panel     Status: Abnormal   Collection Time: 06/05/23  3:18 PM  Result Value Ref Range   Sodium 141 135 - 145 mmol/L   Potassium 3.9 3.5 - 5.1 mmol/L   Chloride 98 98 - 111 mmol/L   CO2 31 22 - 32 mmol/L   Glucose, Bld 73 70 - 99 mg/dL    Comment: Glucose reference range applies only to samples taken  after fasting for at least 8 hours.   BUN 26 (H) 8 - 23 mg/dL   Creatinine, Ser 6.21 0.61 - 1.24 mg/dL   Calcium 8.9 8.9 - 30.8 mg/dL   Total Protein 6.6 6.5 - 8.1 g/dL   Albumin 3.0 (L) 3.5 - 5.0 g/dL   AST 18 15 - 41 U/L   ALT 19 0 - 44 U/L   Alkaline Phosphatase  55 38 - 126 U/L   Total Bilirubin 0.7 0.3 - 1.2 mg/dL   GFR, Estimated >40 >98 mL/min    Comment: (NOTE) Calculated using the CKD-EPI Creatinine Equation (2021)    Anion gap 12 5 - 15    Comment: Performed at [facility], [address], [place], [place] [address]  Troponin I (High Sensitivity)     Status: Abnormal   Collection Time: 06/05/23  3:18 PM  Result Value Ref Range   Troponin I (High Sensitivity) 24 (H) <18 ng/L    Comment: (NOTE) Elevated high sensitivity troponin I (hsTnI) values and significant  changes across serial measurements may suggest ACS but many other  chronic and acute conditions are known to elevate hsTnI results.  Refer to the "Links" section for chest pain algorithms and additional  guidance. Performed at [facility], [address] [place], [place] [address]   Type and screen [facility]     Status: None (Preliminary result)   Collection Time: 06/05/23  3:18 PM  Result Value Ref Range   ABO/RH(D) O POS    Antibody Screen POS    Sample Expiration 06/08/2023,2359    Antibody Identification ANTI K    Unit Number [id number]    Blood Component Type RED CELLS,LR    Unit division 00    Status of Unit ALLOCATED    Donor AG Type NEGATIVE FOR KELL ANTIGEN    Transfusion Status OK TO TRANSFUSE    Crossmatch Result COMPATIBLE    Unit Number [id number]    Blood Component Type RED CELLS,LR    Unit division 00    Status of Unit ALLOCATED    Donor AG Type NEGATIVE FOR KELL ANTIGEN    Transfusion Status OK TO TRANSFUSE    Crossmatch Result COMPATIBLE   Brain natriuretic peptide     Status: Abnormal   Collection Time: 06/05/23  3:30 PM  Result Value Ref  Range   B Natriuretic Peptide 273.8 (H) 0.0 - 100.0 pg/mL    Comment: Performed at [facility], [address], [place], [place] [address]  I-stat chem 8, ED     Status: Abnormal   Collection Time: 06/05/23  3:49 PM  Result Value Ref Range   Sodium 141 135 - 145 mmol/L   Potassium 4.0 3.5 - 5.1 mmol/L   Chloride 99 98 - 111 mmol/L   BUN 26 (H) 8 - 23 mg/dL   Creatinine, Ser 5.36 0.61 - 1.24 mg/dL   Glucose, Bld 73 70 - 99 mg/dL    Comment: Glucose reference range applies only to samples taken after fasting for at least 8 hours.   Calcium, Ion 1.15 1.15 - 1.40 mmol/L   TCO2 33 (H) 22 - 32 mmol/L   Hemoglobin 8.5 (L) 13.0 - 17.0 g/dL   HCT 64.4 (L) 03.4 - 74.2 %  Troponin I (High Sensitivity)     Status: Abnormal   Collection Time: 06/05/23  5:55 PM  Result Value Ref Range   Troponin I (High Sensitivity) 27 (H) <18 ng/L    Comment: (NOTE) Elevated high sensitivity troponin I (hsTnI) values and significant  changes across serial measurements may suggest ACS but many other  chronic and acute conditions are known to elevate hsTnI results.  Refer to the "Links" section for chest pain algorithms and additional  guidance. Performed at [facility], [address] [place], [place] [address]    DG Chest [place] [address]  Result Date: 06/05/2023 CLINICAL DATA:  Shortness of breath. EXAM: PORTABLE CHEST 1 VIEW COMPARISON:  Chest radiograph 12/25/2018. FINDINGS: Mild interstitial prominence. No confluent consolidation. No visible pneumothorax. Possible small left pleural effusion. Cardiomegaly. Median sternotomy. Pulmonary arterial enlargement. IMPRESSION: 1. Cardiomegaly and mild interstitial prominence, which could represent the sequela of recurrent bouts of CHF or mild interstitial edema. No confluent consolidation. 2. Possible small left pleural effusion. Electronically Signed   By: Feliberto Harts M.D.   On: 06/05/2023 15:22    Pending Labs Unresulted Labs (From admission,  onward)     Start     Ordered   06/06/23 0500  Comprehensive metabolic panel  Tomorrow morning,   R        06/05/23 1858   06/06/23 0500  CBC  Tomorrow morning,   R        06/05/23 1858   06/05/23 1858  Magnesium  Add-on,   AD        06/05/23 1858            Vitals/Pain Today's Vitals   06/05/23 1730 06/05/23 1745 06/05/23 1800 06/05/23 1801  BP: 126/67 (!) 116/50    Pulse: (!) 49 (!) 25 (!) 47   Resp: (!) 29 (!) 25 (!) 26   Temp:    97.8 F (36.6 C)  TempSrc:    Oral  SpO2: 99% 96% 98%   Weight:      Height:      PainSc:        Isolation Precautions No active isolations  Medications Medications  aspirin EC tablet 81 mg (has no administration in time range)  atorvastatin (LIPITOR) tablet 80 mg (has no administration in time range)  ezetimibe (ZETIA) tablet 10 mg (has no administration in time range)  dicyclomine (BENTYL) capsule 10 mg (has no administration in time range)  levothyroxine (SYNTHROID) tablet 88 mcg (has no administration in time range)  pantoprazole (PROTONIX) EC tablet 40 mg (has no administration in time range)  revefenacin (YUPELRI) nebulizer solution 175 mcg (has no administration in time range)  budesonide (PULMICORT) nebulizer solution 0.5 mg (has no administration in time range)  furosemide (LASIX) injection 40 mg (has no administration in time range)  sodium chloride flush (NS) 0.9 % injection 3 mL (has no administration in time range)  acetaminophen (TYLENOL) tablet 650 mg (has no administration in time range)    Or  acetaminophen (TYLENOL) suppository 650 mg (has no administration in time range)  polyethylene glycol (MIRALAX / GLYCOLAX) packet 17 g (has no administration in time range)  furosemide (LASIX) injection 40 mg (40 mg Intravenous Given 06/05/23 1656)    Mobility walks     Focused Assessments See chart   R Recommendations: See Admitting Provider Note  Report given to:   Additional Notes: See chart

## 2023-06-05 NOTE — ED Triage Notes (Signed)
Patient arrives in wheelchair by POV states sent by his PCP for shortness of breath and weight gain of 5 lbs in two days. On 4L [place] at home. Has been taking lasix at home.

## 2023-06-05 NOTE — ED Notes (Addendum)
Pt's wife brought Pt food and Pt got a tray from [facility].

## 2023-06-06 DIAGNOSIS — E1122 Type 2 diabetes mellitus with diabetic chronic kidney disease: Secondary | ICD-10-CM | POA: Diagnosis present

## 2023-06-06 DIAGNOSIS — N1831 Chronic kidney disease, stage 3a: Secondary | ICD-10-CM | POA: Diagnosis present

## 2023-06-06 DIAGNOSIS — E1165 Type 2 diabetes mellitus with hyperglycemia: Secondary | ICD-10-CM | POA: Diagnosis present

## 2023-06-06 DIAGNOSIS — J9621 Acute and chronic respiratory failure with hypoxia: Secondary | ICD-10-CM | POA: Diagnosis present

## 2023-06-06 DIAGNOSIS — I5033 Acute on chronic diastolic (congestive) heart failure: Secondary | ICD-10-CM | POA: Diagnosis not present

## 2023-06-06 DIAGNOSIS — I2510 Atherosclerotic heart disease of native coronary artery without angina pectoris: Secondary | ICD-10-CM | POA: Diagnosis present

## 2023-06-06 DIAGNOSIS — E7800 Pure hypercholesterolemia, unspecified: Secondary | ICD-10-CM | POA: Diagnosis present

## 2023-06-06 DIAGNOSIS — K922 Gastrointestinal hemorrhage, unspecified: Secondary | ICD-10-CM | POA: Diagnosis present

## 2023-06-06 DIAGNOSIS — I441 Atrioventricular block, second degree: Secondary | ICD-10-CM | POA: Diagnosis present

## 2023-06-06 DIAGNOSIS — I272 Pulmonary hypertension, unspecified: Secondary | ICD-10-CM | POA: Diagnosis present

## 2023-06-06 DIAGNOSIS — I480 Paroxysmal atrial fibrillation: Secondary | ICD-10-CM | POA: Diagnosis present

## 2023-06-06 DIAGNOSIS — I5031 Acute diastolic (congestive) heart failure: Secondary | ICD-10-CM

## 2023-06-06 DIAGNOSIS — I130 Hypertensive heart and chronic kidney disease with heart failure and stage 1 through stage 4 chronic kidney disease, or unspecified chronic kidney disease: Secondary | ICD-10-CM | POA: Diagnosis present

## 2023-06-06 DIAGNOSIS — E039 Hypothyroidism, unspecified: Secondary | ICD-10-CM | POA: Diagnosis present

## 2023-06-06 DIAGNOSIS — I252 Old myocardial infarction: Secondary | ICD-10-CM | POA: Diagnosis not present

## 2023-06-06 DIAGNOSIS — G8929 Other chronic pain: Secondary | ICD-10-CM | POA: Diagnosis present

## 2023-06-06 DIAGNOSIS — J449 Chronic obstructive pulmonary disease, unspecified: Secondary | ICD-10-CM | POA: Diagnosis present

## 2023-06-06 DIAGNOSIS — Z79890 Hormone replacement therapy (postmenopausal): Secondary | ICD-10-CM | POA: Diagnosis not present

## 2023-06-06 DIAGNOSIS — K219 Gastro-esophageal reflux disease without esophagitis: Secondary | ICD-10-CM | POA: Diagnosis present

## 2023-06-06 DIAGNOSIS — Z951 Presence of aortocoronary bypass graft: Secondary | ICD-10-CM | POA: Diagnosis not present

## 2023-06-06 DIAGNOSIS — I083 Combined rheumatic disorders of mitral, aortic and tricuspid valves: Secondary | ICD-10-CM | POA: Diagnosis present

## 2023-06-06 DIAGNOSIS — M109 Gout, unspecified: Secondary | ICD-10-CM | POA: Diagnosis present

## 2023-06-06 DIAGNOSIS — G4733 Obstructive sleep apnea (adult) (pediatric): Secondary | ICD-10-CM | POA: Diagnosis present

## 2023-06-06 DIAGNOSIS — D500 Iron deficiency anemia secondary to blood loss (chronic): Secondary | ICD-10-CM | POA: Diagnosis not present

## 2023-06-06 LAB — RETICULOCYTES
Immature Retic Fract: 21.9 % — ABNORMAL HIGH (ref 2.3–15.9)
RBC.: 2.97 MIL/uL — ABNORMAL LOW (ref 4.22–5.81)
Retic Count, Absolute: 111.4 10*3/uL (ref 19.0–186.0)
Retic Ct Pct: 3.8 % — ABNORMAL HIGH (ref 0.4–3.1)

## 2023-06-06 LAB — GLUCOSE, CAPILLARY
Glucose-Capillary: 111 mg/dL — ABNORMAL HIGH (ref 70–99)
Glucose-Capillary: 162 mg/dL — ABNORMAL HIGH (ref 70–99)
Glucose-Capillary: 122 mg/dL — ABNORMAL HIGH (ref 70–99)
Glucose-Capillary: 148 mg/dL — ABNORMAL HIGH (ref 70–99)

## 2023-06-06 LAB — VITAMIN B12: Vitamin B-12: 194 pg/mL (ref 180–914)

## 2023-06-06 LAB — IRON AND TIBC
Iron: 48 ug/dL (ref 45–182)
Saturation Ratios: 13 % — ABNORMAL LOW (ref 17.9–39.5)
TIBC: 367 ug/dL (ref 250–450)
UIBC: 319 ug/dL

## 2023-06-06 LAB — COMPREHENSIVE METABOLIC PANEL
ALT: 17 U/L (ref 0–44)
AST: 15 U/L (ref 15–41)
Albumin: 2.7 g/dL — ABNORMAL LOW (ref 3.5–5.0)
Alkaline Phosphatase: 53 U/L (ref 38–126)
Anion gap: 10 (ref 5–15)
BUN: 27 mg/dL — ABNORMAL HIGH (ref 8–23)
CO2: 31 mmol/L (ref 22–32)
Calcium: 8.3 mg/dL — ABNORMAL LOW (ref 8.9–10.3)
Chloride: 97 mmol/L — ABNORMAL LOW (ref 98–111)
Creatinine, Ser: 1.02 mg/dL (ref 0.61–1.24)
GFR, Estimated: >60 mL/min (ref >60)
Glucose, Bld: 154 mg/dL — ABNORMAL HIGH (ref 70–99)
Potassium: 3.5 mmol/L (ref 3.5–5.1)
Sodium: 138 mmol/L (ref 135–145)
Total Bilirubin: 0.6 mg/dL (ref 0.3–1.2)
Total Protein: 6.1 g/dL — ABNORMAL LOW (ref 6.5–8.1)

## 2023-06-06 LAB — HEMOGLOBIN AND HEMATOCRIT, BLOOD
HCT: 26 % — ABNORMAL LOW (ref 39.0–52.0)
Hemoglobin: 7.5 g/dL — ABNORMAL LOW (ref 13.0–17.0)

## 2023-06-06 LAB — CBC
HCT: 24 % — ABNORMAL LOW (ref 39.0–52.0)
Hemoglobin: 6.8 g/dL — CL (ref 13.0–17.0)
MCH: 26.1 pg (ref 26.0–34.0)
MCHC: 28.3 g/dL — ABNORMAL LOW (ref 30.0–36.0)
MCV: 92 fL (ref 80.0–100.0)
Platelets: 168 10*3/uL (ref 150–400)
RBC: 2.61 MIL/uL — ABNORMAL LOW (ref 4.22–5.81)
RDW: 15.7 % — ABNORMAL HIGH (ref 11.5–15.5)
WBC: 3.9 10*3/uL — ABNORMAL LOW (ref 4.0–10.5)
nRBC: 0 % (ref 0.0–0.2)

## 2023-06-06 LAB — FOLATE: Folate: 34.3 ng/mL (ref >5.9)

## 2023-06-06 LAB — TYPE AND SCREEN: Donor AG Type: NEGATIVE

## 2023-06-06 LAB — FERRITIN: Ferritin: 70 ng/mL (ref 24–336)

## 2023-06-06 LAB — PREPARE RBC (CROSSMATCH)

## 2023-06-06 MED ORDER — ENSURE ENLIVE PO LIQD
237.0000 mL | Freq: Two times a day (BID) | ORAL | Status: DC
  Administered 2023-06-06 – 2023-06-11 (×11): 237 mL via ORAL

## 2023-06-06 MED ORDER — SODIUM CHLORIDE 0.9% IV SOLUTION: Freq: Once | INTRAVENOUS | Status: AC

## 2023-06-06 MED ORDER — SODIUM CHLORIDE 0.9 % IV SOLN
250.0000 mg | Freq: Every day | INTRAVENOUS | Status: AC
  Administered 2023-06-06 – 2023-06-09 (×4): 250 mg via INTRAVENOUS
  Filled 2023-06-06 (×4): qty 20

## 2023-06-06 NOTE — Plan of Care (Signed)
  Problem: Education: Goal: Ability to demonstrate management of disease process will improve Outcome: Progressing Goal: Ability to verbalize understanding of medication therapies will improve Outcome: Progressing   Problem: Activity: Goal: Capacity to carry out activities will improve Outcome: Progressing   Problem: Coping: Goal: Ability to adjust to condition or change in health will improve Outcome: Progressing   Problem: Fluid Volume: Goal: Ability to maintain a balanced intake and output will improve Outcome: Progressing   Problem: Education: Goal: Knowledge of General Education information will improve Description: Including pain rating scale, medication(s)/side effects and non-pharmacologic comfort measures Outcome: Progressing   Problem: Skin Integrity: Goal: Risk for impaired skin integrity will decrease Outcome: Not Progressing

## 2023-06-06 NOTE — Progress Notes (Signed)
Pt refusing bipap for the night, pt stated he would rather wear his O2.

## 2023-06-06 NOTE — Progress Notes (Signed)
   06/06/23 1951  BiPAP/CPAP/SIPAP  Reason BIPAP/CPAP not in use Non-compliant

## 2023-06-06 NOTE — Progress Notes (Signed)
PROGRESS NOTE    Jeremiah [name]  XLK:[id number] DOB: 29-Aug-1946 DOA: 06/05/2023 PCP: [name], MD  77/M with history of type 2 diabetes mellitus, diastolic CHF, chronic respiratory failure on 2 to 3 L home O2, CKD 3A, chronic anemia, hypothyroidism, hypertension, COPD, CABG, pulmonary hypertension, OSA and obesity presented to the ED with worsening shortness of breath and anemia. -Ongoing issues with iron deficiency anemia, undergoing workup through [facility], EGD and colonoscopy were unremarkable, due for capsule endoscopy -In the ER hemoglobin was 7.4, troponin 24, BNP 273, chest x-ray with cardiomegaly and suspected edema   Subjective: Feels better today after Lasix and blood  Assessment and Plan:  Acute on chronic respiratory failure with hypoxia Acute on chronic diastolic CHF -Compounded by worsening anemia -Recent echo 2/24 with preserved EF, grade 2 diastolic dysfunction, mildly reduced RV, moderate to severe mitral stenosis -Cardiology consulting, transfused 1 unit PRBC yesterday -Continue IV Lasix today -Add Aldactone tomorrow if blood pressure, kidney function stable   Subacute anemia, heme positive stools -Ongoing gastroenterology workup, EGD and colonoscopy at [facility] in the last few months was unremarkable, due for "capsule endoscopy soon -Check anemia panel, anticipate he will benefit from IV iron infusions -Suspect chronic intermittent low-grade GI bleeding -Taken off anticoagulation few months ago -CBC in a.m.  Paroxysmal atrial fibrillation -Off anticoagulation due to GI bleeding -Cards consulting, considering [facility]   COPD - Continue home Yupelri and Pulmicort   OSA - Continue home BiPAP nightly   Diabetes - SSI   CKD 2 -Stable, monitor with diuretics   Hypothyroidism - Continue Synthroid   Hyperlipidemia CAD/CABG - Continue statin, Zetia, aspirin   Hypertension - Diuresis as above    Obesity - Noted    DVT prophylaxis:  SCDs Code Status: Full code Family Communication: None present Disposition Plan: Home likely 48 hours  Consultants:    Procedures:   Antimicrobials:    Objective: Vitals:   06/06/23 0449 06/06/23 0730 06/06/23 0816 06/06/23 1132  BP: (!) 133/50 130/68  (!) 130/39  Pulse: 63 76  83  Resp: 20 19  18   Temp: 98.3 F (36.8 C)  97.8 F (36.6 C)   TempSrc: Oral  Oral   SpO2: 97% 100% 100% 95%  Weight:      Height:        Intake/Output Summary (Last 24 hours) at 06/06/2023 1217 Last data filed at 06/06/2023 0600 Gross per 24 hour  Intake 734 ml  Output 200 ml  Net 534 ml   Filed Weights   06/05/23 1355 06/06/23 0317  Weight: 93.9 kg 91.1 kg    Examination:  General exam: Pleasant obese male sitting up in bed, AAOx3, no distress HEENT: Positive JVD CVS: S1-S2, regular rhythm Lungs: Decreased breath sounds at the bases Abdomen: Soft, nontender, bowel sounds present Extremities: 1+ edema Skin: No rashes Psychiatry:  Mood & affect appropriate.     Data Reviewed:   CBC: Recent Labs  Lab 06/05/23 1518 06/05/23 1549 06/06/23 0013 06/06/23 0705  WBC 4.3  --  3.9*  --   NEUTROABS 2.8  --   --   --   HGB 7.4* 8.5* 6.8* 7.5*  HCT 27.0* 25.0* 24.0* 26.0*  MCV 93.1  --  92.0  --   PLT 163  --  168  --    Basic Metabolic Panel: Recent Labs  Lab 06/05/23 1518 06/05/23 1549 06/05/23 2035 06/06/23 0013  NA 141 141  --  138  K 3.9 4.0  --  3.5  CL 98 99  --  97*  CO2 31  --   --  31  GLUCOSE 73 73  --  154*  BUN 26* 26*  --  27*  CREATININE 0.86 0.90  --  1.02  CALCIUM 8.9  --   --  8.3*  MG  --   --  2.1  --    GFR: Estimated Creatinine Clearance: 67.5 mL/min (by C-G formula based on SCr of 1.02 mg/dL). Liver Function Tests: Recent Labs  Lab 06/05/23 1518 06/06/23 0013  AST 18 15  ALT 19 17  ALKPHOS 55 53  BILITOT 0.7 0.6  PROT 6.6 6.1*  ALBUMIN 3.0* 2.7*   No results for input(s): "LIPASE", "AMYLASE" in the last 168 hours. No results for  input(s): "AMMONIA" in the last 168 hours. Coagulation Profile: No results for input(s): "INR", "PROTIME" in the last 168 hours. Cardiac Enzymes: No results for input(s): "CKTOTAL", "CKMB", "CKMBINDEX", "TROPONINI" in the last 168 hours. BNP (last 3 results) No results for input(s): "PROBNP" in the last 8760 hours. HbA1C: No results for input(s): "HGBA1C" in the last 72 hours. CBG: Recent Labs  Lab 06/06/23 0618 06/06/23 1128  GLUCAP 111* 162*   Lipid Profile: No results for input(s): "CHOL", "HDL", "LDLCALC", "TRIG", "CHOLHDL", "LDLDIRECT" in the last 72 hours. Thyroid Function Tests: No results for input(s): "TSH", "T4TOTAL", "FREET4", "T3FREE", "THYROIDAB" in the last 72 hours. Anemia Panel: Recent Labs    06/06/23 0705  FERRITIN 70  TIBC 367  IRON 48  RETICCTPCT 3.8*   Urine analysis:    Component Value Date/Time   COLORURINE YELLOW 02/05/2023 2342   APPEARANCEUR CLEAR 02/05/2023 2342   LABSPEC 1.043 (H) 02/05/2023 2342   PHURINE 6.0 02/05/2023 2342   GLUCOSEU >=500 (A) 02/05/2023 2342   HGBUR NEGATIVE 02/05/2023 2342   BILIRUBINUR NEGATIVE 02/05/2023 2342   KETONESUR 20 (A) 02/05/2023 2342   PROTEINUR NEGATIVE 02/05/2023 2342   NITRITE NEGATIVE 02/05/2023 2342   LEUKOCYTESUR NEGATIVE 02/05/2023 2342   Sepsis Labs: @LABRCNTIP (procalcitonin:4,lacticidven:4)  )No results found for this or any previous visit (from the past 240 hour(s)).   Radiology Studies: DG Chest Port 1 View  Result Date: 06/05/2023 CLINICAL DATA:  Shortness of breath. EXAM: PORTABLE CHEST 1 VIEW COMPARISON:  Chest radiograph 12/25/2018. FINDINGS: Mild interstitial prominence. No confluent consolidation. No visible pneumothorax. Possible small left pleural effusion. Cardiomegaly. Median sternotomy. Pulmonary arterial enlargement. IMPRESSION: 1. Cardiomegaly and mild interstitial prominence, which could represent the sequela of recurrent bouts of CHF or mild interstitial edema. No confluent  consolidation. 2. Possible small left pleural effusion. Electronically Signed   By: Feliberto Harts M.D.   On: 06/05/2023 15:22     Scheduled Meds:  aspirin EC  81 mg Oral Daily   atorvastatin  80 mg Oral Daily   budesonide  0.5 mg Nebulization BID   ezetimibe  10 mg Oral Daily   feeding supplement  237 mL Oral BID BM   furosemide  40 mg Intravenous BID   insulin aspart  0-9 Units Subcutaneous TID WC   levothyroxine  88 mcg Oral Q0600   pantoprazole  40 mg Oral Daily   revefenacin  175 mcg Inhalation [id number]   sodium chloride flush  3 mL Intravenous Q12H   triamcinolone cream   Topical TID   Continuous Infusions:   LOS: 0 days    Time spent:    Zannie Cove, MD [facility]   06/06/2023, 12:17 PM

## 2023-06-06 NOTE — Consult Note (Signed)
Cardiology Consultation   Patient ID: Jeremiah [name] MRN: [id number]; DOB: February 06, 1946  Admit date: 06/05/2023 Date of Consult: 06/06/2023  PCP:  [name], MD   [facility] Providers Cardiologist:  Chrystie Nose, MD  Electrophysiologist:  Regan Lemming, MD       Patient Profile:   Jeremiah [name] is a 77 y.o. male with a hx of PAF, mitral stenosis, CAD, s/p remote CABG, and AVWB who is being seen 06/06/2023 for the evaluation of worsening heart failure in the setting of anemia due to GI bleeding at the request of Dr. Jomarie Longs.  History of Present Illness:   Jeremiah [name] is a 77 yo patient of Dr. Blanchie [name] with a h/o CAD, s/p CABG and subsequent stenting. He was hospitalized for 2 months at [facility] earlier this year due to occult GI bleeding. Still no diagnosis. He has had progressively worsening sob. He has preserved LV function on echo 4 months ago. He has know AVWB. He has a gradient of 10 mm Hg across the mitral valve. The patient denies syncope. He has been treated with IV lasix after his home dose of lasix was doubled with no benefit.he denies medical non-compliance.    Past Medical History:  Diagnosis Date   A-fib [facility])    Acute on chronic systolic (congestive) heart failure (HCC) 06/10/2017   Acute pulmonary edema (HCC)    Anemia    Aortic valve sclerosis 12/2018   Noted on ECHO   Arthritis    CAD (coronary artery disease)    a. 2006: CABG in 2006 with LIMA-LAD, SVG-OM1, and SVG-RPDA   Cardiomegaly 12/2018   Stable, noted on CXR   Carpal tunnel syndrome    Right   Chronic pain 03/21/2016   Colon cancer (HCC) 2006   COPD (chronic obstructive pulmonary disease) (HCC)    Diabetes mellitus without complication (HCC)    DVT (deep venous thrombosis) (HCC)    Right   Essential hypertension 03/21/2016   GERD (gastroesophageal reflux disease)    History of blood transfusion    History of Clostridioides difficile infection    History of prostate cancer 03/21/2016    History of PSVT (paroxysmal supraventricular tachycardia)    HLD (hyperlipidemia)    HTN (hypertension)    Hx of CABG 2006   Hypercholesteremia 03/21/2016   Hypothyroidism    LAE (left atrial enlargement) 12/2018   Severe, Noted on ECHO   LVH (left ventricular hypertrophy) 12/2018   Mild, Noted on ECHO   Medication management 03/21/2016   Mitral annular calcification 12/2018   with mild MS, Noted on ECHO   Morbid obesity (HCC) 03/21/2016   Myocardial infarct (HCC)    OSA (obstructive sleep apnea) 03/21/2016   uses oxygen at night time   Pain in right ankle and joints of right foot 03/21/2016   Paronychia 03/21/2016   Phimosis    Pneumonia    Primary insomnia 03/21/2016   Prostate cancer (HCC) 2008   Pulmonary hypertension (HCC) 12/2018   Moderate, Noted on ECHO   Tobacco dependence 03/21/2016   Tricuspid regurgitation 12/2018   Mild, Noted on ECHO    Past Surgical History:  Procedure Laterality Date   ANKLE SURGERY Right 12/2013   CARDIAC CATHETERIZATION N/A 05/08/2016   Procedure: Left Heart Cath and Cors/Grafts Angiography;  Surgeon: Lyn Records, MD;  Location: [facility] INVASIVE CV LAB;  Service: Cardiovascular;  Laterality: N/A;   CIRCUMCISION N/A 10/07/2019   Procedure: CIRCUMCISION ADULT;  Surgeon: Crist Fat, MD;  Location: [facility] ORS;  Service: Urology;  Laterality: N/A;   CORONARY ARTERY BYPASS GRAFT  2006   x3   ELECTROPHYSIOLOGIC STUDY N/A 08/24/2016   Procedure: SVT Ablation;  Surgeon: Will Jorja Loa, MD;  Location: [facility] INVASIVE CV LAB;  Service: Cardiovascular;  Laterality: N/A;   PROSTATE SURGERY  2008   PTCA     UMBILICAL HERNIA REPAIR N/A 05/26/2022   Procedure: PRIMARY REPAIR OF STRANGULATED UMBILICAL HERNIA WITH PARTIAL OMENTECTOMY;  Surgeon: Almond Lint, MD;  Location: [facility] OR;  Service: General;  Laterality: N/A;     Home Medications:  Prior to Admission medications   Medication Sig Start Date End Date Taking? Authorizing Provider  aspirin EC 81  MG tablet Take 1 tablet (81 mg total) by mouth daily. Swallow whole. 04/17/23  Yes Orbie Pyo, MD  atorvastatin (LIPITOR) 80 MG tablet Take 80 mg by mouth daily.   Yes [provider]  budesonide (PULMICORT) 0.5 MG/2ML nebulizer solution Take 2 mLs (0.5 mg total) by nebulization 2 (two) times daily. 05/13/23  Yes Martina Sinner, MD  cetirizine (ZYRTEC) 10 MG tablet Take 10 mg by mouth daily as needed for allergies.   Yes [provider]  colchicine 0.6 MG tablet Take 0.6 mg by mouth daily as needed (Gout).   Yes [provider]  dexlansoprazole (DEXILANT) 60 MG capsule Take 60 mg by mouth daily. 01/29/23  Yes [provider]  dicyclomine (BENTYL) 10 MG capsule Take 10 mg by mouth daily as needed for spasms. 02/19/23  Yes [provider]  ezetimibe (ZETIA) 10 MG tablet TAKE 1 TABLET(10 MG) BY MOUTH DAILY 07/16/22  Yes Hilty, Lisette Abu, MD  ferrous sulfate (FEROSUL) 325 (65 FE) MG tablet TAKE 1 TABLET BY MOUTH DAILY WITH BREAKFAST Patient taking differently: Take 325 mg by mouth daily with breakfast. 05/03/22  Yes Hilty, Lisette Abu, MD  furosemide (LASIX) 40 MG tablet Take 40-80 mg by mouth See admin instructions. Take 80 mg (2 tablets) by mouth twice a day for 3 days, then take 40 mg (1 tablet) once a day for 30 days. 04/11/23  Yes [provider]  glimepiride (AMARYL) 4 MG tablet Take 4 mg by mouth daily.   Yes [provider]  levothyroxine (SYNTHROID, LEVOTHROID) 88 MCG tablet Take 88 mcg by mouth daily before breakfast.   Yes [provider]  metoCLOPramide (REGLAN) 5 MG tablet Take 5 mg by mouth every 8 (eight) hours as needed for nausea or vomiting. 12/21/22  Yes [provider]  OXYGEN Inhale 4 L into the lungs at bedtime. 4 lpm with 24/7 [facility]    [provider]    Inpatient Medications: Scheduled Meds:  aspirin EC  81 mg Oral Daily   atorvastatin  80 mg Oral Daily   budesonide  0.5 mg Nebulization BID    ezetimibe  10 mg Oral Daily   feeding supplement  237 mL Oral BID BM   furosemide  40 mg Intravenous BID   insulin aspart  0-9 Units Subcutaneous TID WC   levothyroxine  88 mcg Oral Q0600   pantoprazole  40 mg Oral Daily   revefenacin  175 mcg Inhalation BH-q7a   sodium chloride flush  3 mL Intravenous Q12H   triamcinolone cream   Topical TID   Continuous Infusions:  PRN Meds: acetaminophen **OR** acetaminophen, dicyclomine, polyethylene glycol  Allergies:    Allergies  Allergen Reactions   Shellfish Allergy Shortness Of Breath, Nausea And Vomiting and Rash  Scallops     Fluzone [Influenza Virus Vaccine] Nausea And Vomiting, Palpitations and Rash    Social History:   Social History   Socioeconomic History   Marital status: Married    Spouse name: Not on file   Number of children: 0   Years of education: Not on file   Highest education level: Not on file  Occupational History   Occupation: retired - direct [employer]  Tobacco Use   Smoking status: Former    Packs/day: 0.75    Years: 60.00    Additional pack years: 0.00    Total pack years: 45.00    Types: Cigarettes    Quit date: 12/15/2016    Years since quitting: 6.4   Smokeless tobacco: Never  Vaping Use   Vaping Use: Never used  Substance and Sexual Activity   Alcohol use: Not Currently    Comment: very seldom   Drug use: No   Sexual activity: Not on file  Other Topics Concern   Not on file  Social History Narrative   Retired, married, no children      epworth sleepiness scale = 9 (04/13/16) (has OSA)   Social Determinants of Health   Financial Resource Strain: Not on file  Food Insecurity: No Food Insecurity (06/05/2023)   Hunger Vital Sign    Worried About Running Out of Food in the Last Year: Never true    Ran Out of Food in the Last Year: Never true  Transportation Needs: No Transportation Needs (06/05/2023)   PRAPARE - Administrator, Civil Service (Medical): No    Lack of  Transportation (Non-Medical): No  Physical Activity: Not on file  Stress: Not on file  Social Connections: Not on file  Intimate Partner Violence: Not At Risk (06/05/2023)   Humiliation, Afraid, Rape, and Kick questionnaire    Fear of Current or Ex-Partner: No    Emotionally Abused: No    Physically Abused: No    Sexually Abused: No    Family History:    Family History  Problem Relation Age of Onset   Dementia Mother    Diabetes Sister      ROS:  Please see the history of present illness.   All other ROS reviewed and negative.     Physical Exam/Data:   Vitals:   06/06/23 0400 06/06/23 0449 06/06/23 0730 06/06/23 0816  BP: (!) 117/54 (!) 133/50 130/68   Pulse: 83 63 76   Resp: 17 20 19    Temp: 98.3 F (36.8 C) 98.3 F (36.8 C)  97.8 F (36.6 C)  TempSrc: Oral Oral  Oral  SpO2: 97% 97% 100% 100%  Weight:      Height:        Intake/Output Summary (Last 24 hours) at 06/06/2023 0935 Last data filed at 06/06/2023 0600 Gross per 24 hour  Intake 734 ml  Output 200 ml  Net 534 ml      06/06/2023    3:17 AM 06/05/2023    1:55 PM 05/29/2023    7:14 AM  Last 3 Weights  Weight (lbs) 200 lb 13.4 oz 207 lb 195 lb  Weight (kg) 91.1 kg 93.895 kg 88.451 kg     Body mass index is 30.54 kg/m.  General:  obese, Well nourished, well developed, in no acute distress HEENT: normal Neck: 7 cm JVD Vascular: No carotid bruits; Distal pulses 2+ bilaterally Cardiac:  normal S1, S2; RRR; 2/6 systolic murmur  Lungs:  scattered rales with no increased work  of breathing. Abd: soft, nontender, no hepatomegaly  Ext: no edema Musculoskeletal:  No deformities, BUE and BLE strength normal and equal Skin: warm and dry  Neuro:  CNs 2-12 intact, no focal abnormalities noted Psych:  Normal affect   EKG:  The EKG was personally reviewed and demonstrates:  nsr with AVWB Telemetry:  Telemetry was personally reviewed and demonstrates:  NSR with AVWB  Relevant CV Studies: Echo reviewed  Laboratory  Data:  High Sensitivity Troponin:   Recent Labs  Lab 06/05/23 1518 06/05/23 1755  TROPONINIHS 24* 27*     Chemistry Recent Labs  Lab 06/05/23 1518 06/05/23 1549 06/05/23 2035 06/06/23 0013  NA 141 141  --  138  K 3.9 4.0  --  3.5  CL 98 99  --  97*  CO2 31  --   --  31  GLUCOSE 73 73  --  154*  BUN 26* 26*  --  27*  CREATININE 0.86 0.90  --  1.02  CALCIUM 8.9  --   --  8.3*  MG  --   --  2.1  --   GFRNONAA >60  --   --  >60  ANIONGAP 12  --   --  10    Recent Labs  Lab 06/05/23 1518 06/06/23 0013  PROT 6.6 6.1*  ALBUMIN 3.0* 2.7*  AST 18 15  ALT 19 17  ALKPHOS 55 53  BILITOT 0.7 0.6   Lipids No results for input(s): "CHOL", "TRIG", "HDL", "LABVLDL", "LDLCALC", "CHOLHDL" in the last 168 hours.  Hematology Recent Labs  Lab 06/05/23 1518 06/05/23 1549 06/06/23 0013 06/06/23 0705  WBC 4.3  --  3.9*  --   RBC 2.90*  --  2.61*  --   HGB 7.4* 8.5* 6.8* 7.5*  HCT 27.0* 25.0* 24.0* 26.0*  MCV 93.1  --  92.0  --   MCH 25.5*  --  26.1  --   MCHC 27.4*  --  28.3*  --   RDW 15.9*  --  15.7*  --   PLT 163  --  168  --    Thyroid No results for input(s): "TSH", "FREET4" in the last 168 hours.  BNP Recent Labs  Lab 06/05/23 1530  BNP 273.8*    DDimer No results for input(s): "DDIMER" in the last 168 hours.   Radiology/Studies:  DG Chest Port 1 View  Result Date: 06/05/2023 CLINICAL DATA:  Shortness of breath. EXAM: PORTABLE CHEST 1 VIEW COMPARISON:  Chest radiograph 12/25/2018. FINDINGS: Mild interstitial prominence. No confluent consolidation. No visible pneumothorax. Possible small left pleural effusion. Cardiomegaly. Median sternotomy. Pulmonary arterial enlargement. IMPRESSION: 1. Cardiomegaly and mild interstitial prominence, which could represent the sequela of recurrent bouts of CHF or mild interstitial edema. No confluent consolidation. 2. Possible small left pleural effusion. Electronically Signed   By: Feliberto Harts M.D.   On: 06/05/2023 15:22      Assessment and Plan:   Acute diastolic heart failure - Continue IV lasix. Would give 40 mg IV bid. AVWB - of no clinical consequence Mitral stenosis - this is contributing to his CHF. I do not think that he is a surgical candidate.  GI bleeding - this is making everything worse. Continue workup.  Anemia - transfuse PAF - he has a significant risk of stroke. Ideally a Watchman could be used but not sure if we have any data for this. I'll discuss with Dr. Lalla Brothers.   Risk Assessment/Risk Scores:      For questions  or updates, please contact [facility] Please consult [email or web address] for contact info under    Signed, Lewayne Bunting, MD  06/06/2023 9:35 AM

## 2023-06-07 DIAGNOSIS — I5033 Acute on chronic diastolic (congestive) heart failure: Secondary | ICD-10-CM | POA: Diagnosis not present

## 2023-06-07 LAB — GLUCOSE, CAPILLARY
Glucose-Capillary: 111 mg/dL — ABNORMAL HIGH (ref 70–99)
Glucose-Capillary: 172 mg/dL — ABNORMAL HIGH (ref 70–99)
Glucose-Capillary: 146 mg/dL — ABNORMAL HIGH (ref 70–99)
Glucose-Capillary: 169 mg/dL — ABNORMAL HIGH (ref 70–99)

## 2023-06-07 LAB — BASIC METABOLIC PANEL
Anion gap: 9 (ref 5–15)
BUN: 26 mg/dL — ABNORMAL HIGH (ref 8–23)
CO2: 32 mmol/L (ref 22–32)
Calcium: 8.7 mg/dL — ABNORMAL LOW (ref 8.9–10.3)
Chloride: 97 mmol/L — ABNORMAL LOW (ref 98–111)
Creatinine, Ser: 0.96 mg/dL (ref 0.61–1.24)
GFR, Estimated: >60 mL/min (ref >60)
Glucose, Bld: 209 mg/dL — ABNORMAL HIGH (ref 70–99)
Potassium: 3.5 mmol/L (ref 3.5–5.1)
Sodium: 138 mmol/L (ref 135–145)

## 2023-06-07 LAB — CBC
HCT: 27.1 % — ABNORMAL LOW (ref 39.0–52.0)
HCT: 31.9 % — ABNORMAL LOW (ref 39.0–52.0)
Hemoglobin: 7.7 g/dL — ABNORMAL LOW (ref 13.0–17.0)
Hemoglobin: 9.4 g/dL — ABNORMAL LOW (ref 13.0–17.0)
MCH: 24.8 pg — ABNORMAL LOW (ref 26.0–34.0)
MCH: 26.3 pg (ref 26.0–34.0)
MCHC: 28.4 g/dL — ABNORMAL LOW (ref 30.0–36.0)
MCHC: 29.5 g/dL — ABNORMAL LOW (ref 30.0–36.0)
MCV: 87.4 fL (ref 80.0–100.0)
MCV: 89.1 fL (ref 80.0–100.0)
Platelets: 196 10*3/uL (ref 150–400)
Platelets: 216 10*3/uL (ref 150–400)
RBC: 3.1 MIL/uL — ABNORMAL LOW (ref 4.22–5.81)
RBC: 3.58 MIL/uL — ABNORMAL LOW (ref 4.22–5.81)
RDW: 16.6 % — ABNORMAL HIGH (ref 11.5–15.5)
RDW: 16.1 % — ABNORMAL HIGH (ref 11.5–15.5)
WBC: 4.2 10*3/uL (ref 4.0–10.5)
WBC: 4.5 10*3/uL (ref 4.0–10.5)
nRBC: 0 % (ref 0.0–0.2)
nRBC: 0 % (ref 0.0–0.2)

## 2023-06-07 LAB — TYPE AND SCREEN

## 2023-06-07 LAB — BPAM RBC
Blood Product Expiration Date: 202408102359
ISSUE DATE / TIME: 202407040141
ISSUE DATE / TIME: 202407051553

## 2023-06-07 LAB — PREPARE RBC (CROSSMATCH)

## 2023-06-07 MED ORDER — PREDNISONE 10 MG PO TABS
10.0000 mg | ORAL_TABLET | Freq: Every day | ORAL | Status: DC
  Administered 2023-06-07 – 2023-06-08 (×2): 10 mg via ORAL
  Filled 2023-06-07 (×2): qty 1

## 2023-06-07 MED ORDER — SODIUM CHLORIDE 0.9% IV SOLUTION: Freq: Once | INTRAVENOUS | Status: AC

## 2023-06-07 MED ORDER — COLCHICINE 0.6 MG PO TABS
0.6000 mg | ORAL_TABLET | Freq: Every day | ORAL | Status: DC | PRN
  Administered 2023-06-07: 0.6 mg via ORAL
  Filled 2023-06-07: qty 1

## 2023-06-07 MED ORDER — POTASSIUM CHLORIDE CRYS ER 20 MEQ PO TBCR
40.0000 meq | EXTENDED_RELEASE_TABLET | Freq: Once | ORAL | Status: AC
  Administered 2023-06-07: 40 meq via ORAL
  Filled 2023-06-07: qty 2

## 2023-06-07 MED ORDER — SPIRONOLACTONE 12.5 MG HALF TABLET
12.5000 mg | ORAL_TABLET | Freq: Every day | ORAL | Status: DC
  Administered 2023-06-07 – 2023-06-11 (×5): 12.5 mg via ORAL
  Filled 2023-06-07 (×5): qty 1

## 2023-06-07 MED ORDER — FUROSEMIDE 10 MG/ML IJ SOLN
60.0000 mg | Freq: Two times a day (BID) | INTRAMUSCULAR | Status: DC
  Administered 2023-06-07 – 2023-06-09 (×5): 60 mg via INTRAVENOUS
  Filled 2023-06-07 (×5): qty 6

## 2023-06-07 MED ORDER — COLCHICINE 0.6 MG PO TABS
0.6000 mg | ORAL_TABLET | Freq: Once | ORAL | Status: AC
  Administered 2023-06-07: 0.6 mg via ORAL
  Filled 2023-06-07: qty 1

## 2023-06-07 NOTE — Progress Notes (Signed)
Heart Failure Navigator Progress Note  Assessed for Heart & Vascular TOC clinic readiness.  Patient does not meet criteria due to EF 65-70%, has a scheduled [facility] appointment on 06/26/2023..   Navigator will sign off at this time.   Rhae Hammock, BSN, Scientist, clinical (histocompatibility and immunogenetics) Only

## 2023-06-07 NOTE — Progress Notes (Addendum)
PROGRESS NOTE    Jeremiah [name]  ZOX:[id number] DOB: 1946/10/22 DOA: 06/05/2023 PCP: [name], MD  77/M with history of type 2 diabetes mellitus, diastolic CHF, chronic respiratory failure on 2 to 3 L home O2, CKD 3A, chronic anemia, hypothyroidism, hypertension, COPD, CABG, pulmonary hypertension, OSA and obesity presented to the ED with worsening shortness of breath and anemia. -Ongoing issues with iron deficiency anemia, undergoing workup through [facility], EGD and colonoscopy were unremarkable, due for capsule endoscopy -In the ER hemoglobin was 7.4, troponin 24, BNP 273, chest x-ray with cardiomegaly and suspected edema   Subjective: Overall better, still with dyspnea and some orthopnea  Assessment and Plan:  Acute on chronic respiratory failure with hypoxia Acute on chronic diastolic CHF -Compounded by worsening anemia -Recent echo 2/24 with preserved EF, grade 2 diastolic dysfunction, mildly reduced RV, moderate to severe mitral stenosis -Cardiology consulting, transfused PRBC 7/4, 1 unit today -Continue IV Lasix -Will add Aldactone  Moderate to severe mitral stenosis -Per cards   Iron deficiency anemia, heme positive stools -Ongoing gastroenterology workup, EGD and colonoscopy at [facility] in the last few months was unremarkable, due for "capsule endoscopy soon -Anemia panel confirmed iron deficiency, continue IV iron -Suspect chronic intermittent low-grade GI bleeding, continue GI workup at [facility], will also send referral to outpatient hematology for frequent iron infusions -Taken off anticoagulation few months ago -Transfuse 1 unit PRBC today, last transfusion 7/3  Paroxysmal atrial fibrillation -Off anticoagulation due to GI bleeding -Cards consulting, considering Watchman  Gout flare -On account of diuretics and blood, colchicine today, may need prednisone   COPD - Continue home Yupelri and Pulmicort   OSA - Continue home BiPAP nightly    Diabetes - SSI   CKD 2 -Stable, monitor with diuretics   Hypothyroidism - Continue Synthroid   Hyperlipidemia CAD/CABG - Continue statin, Zetia, aspirin   Hypertension - Diuresis as above    Obesity - Noted    DVT prophylaxis: SCDs Code Status: Full code Family Communication: None present Disposition Plan: Home likely 48 hours  Consultants:    Procedures:   Antimicrobials:    Objective: Vitals:   06/07/23 0152 06/07/23 0358 06/07/23 0743 06/07/23 0831  BP:  (!) 112/55 (!) 132/48   Pulse:  73 77   Resp:  18 18   Temp:  97.6 F (36.4 C) 97.9 F (36.6 C)   TempSrc:  Oral Oral   SpO2:  93% 98% 98%  Weight: 90.5 kg     Height:        Intake/Output Summary (Last 24 hours) at 06/07/2023 1110 Last data filed at 06/07/2023 0900 Gross per 24 hour  Intake 990.55 ml  Output 1400 ml  Net -409.45 ml   Filed Weights   06/05/23 1355 06/06/23 0317 06/07/23 0152  Weight: 93.9 kg 91.1 kg 90.5 kg    Examination:  General exam: Pleasant obese male sitting up in the recliner, AAOx3 HEENT: Positive JVD CVS: S1-S2, regular rhythm Lungs: Decreased breath sounds to bases Abdomen: Soft, obese, nontender, bowel sounds present Extremities: 1+ edema  Skin: No rashes Psychiatry:  Mood & affect appropriate.     Data Reviewed:   CBC: Recent Labs  Lab 06/05/23 1518 06/05/23 1549 06/06/23 0013 06/06/23 0705 06/07/23 0038  WBC 4.3  --  3.9*  --  4.2  NEUTROABS 2.8  --   --   --   --   HGB 7.4* 8.5* 6.8* 7.5* 7.7*  HCT 27.0* 25.0* 24.0* 26.0* 27.1*  MCV 93.1  --  92.0  --  87.4  PLT 163  --  168  --  196   Basic Metabolic Panel: Recent Labs  Lab 06/05/23 1518 06/05/23 1549 06/05/23 2035 06/06/23 0013 06/07/23 0038  NA 141 141  --  138 138  K 3.9 4.0  --  3.5 3.5  CL 98 99  --  97* 97*  CO2 31  --   --  31 32  GLUCOSE 73 73  --  154* 209*  BUN 26* 26*  --  27* 26*  CREATININE 0.86 0.90  --  1.02 0.96  CALCIUM 8.9  --   --  8.3* 8.7*  MG  --   --  2.1   --   --    GFR: Estimated Creatinine Clearance: 71.5 mL/min (by C-G formula based on SCr of 0.96 mg/dL). Liver Function Tests: Recent Labs  Lab 06/05/23 1518 06/06/23 0013  AST 18 15  ALT 19 17  ALKPHOS 55 53  BILITOT 0.7 0.6  PROT 6.6 6.1*  ALBUMIN 3.0* 2.7*   No results for input(s): "LIPASE", "AMYLASE" in the last 168 hours. No results for input(s): "AMMONIA" in the last 168 hours. Coagulation Profile: No results for input(s): "INR", "PROTIME" in the last 168 hours. Cardiac Enzymes: No results for input(s): "CKTOTAL", "CKMB", "CKMBINDEX", "TROPONINI" in the last 168 hours. BNP (last 3 results) No results for input(s): "PROBNP" in the last 8760 hours. HbA1C: No results for input(s): "HGBA1C" in the last 72 hours. CBG: Recent Labs  Lab 06/06/23 0618 06/06/23 1128 06/06/23 1554 06/06/23 2110 06/07/23 0529  GLUCAP 111* 162* 122* 148* 111*   Lipid Profile: No results for input(s): "CHOL", "HDL", "LDLCALC", "TRIG", "CHOLHDL", "LDLDIRECT" in the last 72 hours. Thyroid Function Tests: No results for input(s): "TSH", "T4TOTAL", "FREET4", "T3FREE", "THYROIDAB" in the last 72 hours. Anemia Panel: Recent Labs    06/06/23 0705  VITAMINB12 194  FOLATE 34.3  FERRITIN 70  TIBC 367  IRON 48  RETICCTPCT 3.8*   Urine analysis:    Component Value Date/Time   COLORURINE YELLOW 02/05/2023 2342   APPEARANCEUR CLEAR 02/05/2023 2342   LABSPEC 1.043 (H) 02/05/2023 2342   PHURINE 6.0 02/05/2023 2342   GLUCOSEU >=500 (A) 02/05/2023 2342   HGBUR NEGATIVE 02/05/2023 2342   BILIRUBINUR NEGATIVE 02/05/2023 2342   KETONESUR 20 (A) 02/05/2023 2342   PROTEINUR NEGATIVE 02/05/2023 2342   NITRITE NEGATIVE 02/05/2023 2342   LEUKOCYTESUR NEGATIVE 02/05/2023 2342   Sepsis Labs: @LABRCNTIP (procalcitonin:4,lacticidven:4)  )No results found for this or any previous visit (from the past 240 hour(s)).   Radiology Studies: DG Chest Port 1 View  Result Date: 06/05/2023 CLINICAL DATA:   Shortness of breath. EXAM: PORTABLE CHEST 1 VIEW COMPARISON:  Chest radiograph 12/25/2018. FINDINGS: Mild interstitial prominence. No confluent consolidation. No visible pneumothorax. Possible small left pleural effusion. Cardiomegaly. Median sternotomy. Pulmonary arterial enlargement. IMPRESSION: 1. Cardiomegaly and mild interstitial prominence, which could represent the sequela of recurrent bouts of CHF or mild interstitial edema. No confluent consolidation. 2. Possible small left pleural effusion. Electronically Signed   By: Feliberto Harts M.D.   On: 06/05/2023 15:22     Scheduled Meds:  sodium chloride   Intravenous Once   aspirin EC  81 mg Oral Daily   atorvastatin  80 mg Oral Daily   budesonide  0.5 mg Nebulization BID   colchicine  0.6 mg Oral Once   ezetimibe  10 mg Oral Daily   feeding supplement  237 mL Oral BID BM  furosemide  40 mg Intravenous BID   insulin aspart  0-9 Units Subcutaneous TID WC   levothyroxine  88 mcg Oral Q0600   pantoprazole  40 mg Oral Daily   potassium chloride  40 mEq Oral Once   revefenacin  175 mcg Inhalation BH-q7a   sodium chloride flush  3 mL Intravenous Q12H   triamcinolone cream   Topical TID   Continuous Infusions:  ferric gluconate (FERRLECIT) IVPB 250 mg (06/06/23 1511)     LOS: 1 day    Time spent:    Zannie Cove, MD [facility]   06/07/2023, 11:10 AM

## 2023-06-07 NOTE — Progress Notes (Signed)
Progress Note  Patient Name: Jeremiah [name] Date of Encounter: 06/07/2023  Primary Cardiologist: Chrystie Nose, MD   Subjective   Patient seen and examined at his bedside. His wife was at the bedside. He reports coughing and still is unable to lay flat in the bed.  Inpatient Medications    Scheduled Meds:  sodium chloride   Intravenous Once   aspirin EC  81 mg Oral Daily   atorvastatin  80 mg Oral Daily   budesonide  0.5 mg Nebulization BID   colchicine  0.6 mg Oral Once   ezetimibe  10 mg Oral Daily   feeding supplement  237 mL Oral BID BM   furosemide  40 mg Intravenous BID   insulin aspart  0-9 Units Subcutaneous TID WC   levothyroxine  88 mcg Oral Q0600   pantoprazole  40 mg Oral Daily   potassium chloride  40 mEq Oral Once   revefenacin  175 mcg Inhalation BH-q7a   sodium chloride flush  3 mL Intravenous Q12H   triamcinolone cream   Topical TID   Continuous Infusions:  ferric gluconate (FERRLECIT) IVPB 250 mg (06/06/23 1511)   PRN Meds: acetaminophen **OR** acetaminophen, colchicine, dicyclomine, polyethylene glycol   Vital Signs    Vitals:   06/07/23 0152 06/07/23 0358 06/07/23 0743 06/07/23 0831  BP:  (!) 112/55 (!) 132/48   Pulse:  73 77   Resp:  18 18   Temp:  97.6 F (36.4 C) 97.9 F (36.6 C)   TempSrc:  Oral Oral   SpO2:  93% 98% 98%  Weight: 90.5 kg     Height:        Intake/Output Summary (Last 24 hours) at 06/07/2023 1108 Last data filed at 06/07/2023 0900 Gross per 24 hour  Intake 990.55 ml  Output 1400 ml  Net -409.45 ml   Filed Weights   06/05/23 1355 06/06/23 0317 06/07/23 0152  Weight: 93.9 kg 91.1 kg 90.5 kg    Telemetry    Sinus bradycardia - Personally Reviewed  ECG    None today  - Personally Reviewed  Physical Exam    General: Comfortable, sitting up in a chair Head: Atraumatic, normal size  Eyes: PEERLA, EOMI  Neck: Supple, normal JVD Cardiac: Normal S1, S2; RRR; no murmurs, rubs, or gallops Lungs: Clear to  auscultation bilaterally Abd: Soft, nontender, no hepatomegaly  Ext: warm, no edema Musculoskeletal: No deformities, BUE and BLE strength normal and equal Skin: Warm and dry, no rashes   Neuro: Alert and oriented to person, place, time, and situation, CNII-XII grossly intact, no focal deficits  Psych: Normal mood and affect   Labs    Chemistry Recent Labs  Lab 06/05/23 1518 06/05/23 1549 06/06/23 0013 06/07/23 0038  NA 141 141 138 138  K 3.9 4.0 3.5 3.5  CL 98 99 97* 97*  CO2 31  --  31 32  GLUCOSE 73 73 154* 209*  BUN 26* 26* 27* 26*  CREATININE 0.86 0.90 1.02 0.96  CALCIUM 8.9  --  8.3* 8.7*  PROT 6.6  --  6.1*  --   ALBUMIN 3.0*  --  2.7*  --   AST 18  --  15  --   ALT 19  --  17  --   ALKPHOS 55  --  53  --   BILITOT 0.7  --  0.6  --   GFRNONAA >60  --  >60 >60  ANIONGAP 12  --  10 9  Hematology Recent Labs  Lab 06/05/23 1518 06/05/23 1549 06/06/23 0013 06/06/23 0705 06/07/23 0038  WBC 4.3  --  3.9*  --  4.2  RBC 2.90*  --  2.61* 2.97* 3.10*  HGB 7.4*   < > 6.8* 7.5* 7.7*  HCT 27.0*   < > 24.0* 26.0* 27.1*  MCV 93.1  --  92.0  --  87.4  MCH 25.5*  --  26.1  --  24.8*  MCHC 27.4*  --  28.3*  --  28.4*  RDW 15.9*  --  15.7*  --  16.6*  PLT 163  --  168  --  196   < > = values in this interval not displayed.    Cardiac EnzymesNo results for input(s): "TROPONINI" in the last 168 hours. No results for input(s): "TROPIPOC" in the last 168 hours.   BNP Recent Labs  Lab 06/05/23 1530  BNP 273.8*     DDimer No results for input(s): "DDIMER" in the last 168 hours.   Radiology    DG Chest Port 1 View  Result Date: 06/05/2023 CLINICAL DATA:  Shortness of breath. EXAM: PORTABLE CHEST 1 VIEW COMPARISON:  Chest radiograph 12/25/2018. FINDINGS: Mild interstitial prominence. No confluent consolidation. No visible pneumothorax. Possible small left pleural effusion. Cardiomegaly. Median sternotomy. Pulmonary arterial enlargement. IMPRESSION: 1. Cardiomegaly  and mild interstitial prominence, which could represent the sequela of recurrent bouts of CHF or mild interstitial edema. No confluent consolidation. 2. Possible small left pleural effusion. Electronically Signed   By: Feliberto Harts M.D.   On: 06/05/2023 15:22    Cardiac Studies   TTE 01/23/2023  IMPRESSIONS   1. Left ventricular ejection fraction, by estimation, is 65 to 70%. The left ventricle has normal function. The left ventricle has no regional wall motion abnormalities. There is severe concentric left ventricular hypertrophy. Left ventricular diastolic   parameters are consistent with Grade II diastolic dysfunction (pseudonormalization). Elevated left ventricular end-diastolic pressure.   2. Right ventricular systolic function is mildly reduced. The right ventricular size is normal. Tricuspid regurgitation signal is inadequate for assessing PA pressure.   3. Left atrial size was severely dilated.   4. The mitral valve is abnormal. Mild mitral valve regurgitation. Moderate to severe mitral stenosis. The mean mitral valve gradient is 10.0 mmHg with average heart rate of 61 bpm. Severe mitral annular calcification. MVA by VTI is 1.11 cm2.   5. Heavily calcified and immobile non-coronary cusp. The aortic valve is tricuspid. Aortic valve regurgitation is trivial. Aortic valve sclerosis/calcification is present, without any evidence of aortic stenosis. Aortic valve area, by VTI measures 2.09  cm. Aortic valve mean gradient measures 8.0 mmHg. Aortic valve Vmax measures 2.17 m/s. Peak gradient 18.8 mmHg, DI is 0.60.   6. The inferior vena cava is normal in size with greater than 50% respiratory variability, suggesting right atrial pressure of 3 mmHg.   Comparison(s): Changes from prior study are noted. 12/26/2018: LVEF 60-65%,  aortic calcification with restriction of the non-coronary cusp, severe MV  annular calcification with mild mitral stenosis, severe LAE, RVSP 49 mmHg.   FINDINGS   Left  Ventricle: Left ventricular ejection fraction, by estimation, is 65  to 70%. The left ventricle has normal function. The left ventricle has no  regional wall motion abnormalities. The left ventricular internal cavity  size was normal in size. There is   severe concentric left ventricular hypertrophy. Left ventricular  diastolic parameters are consistent with Grade II diastolic dysfunction  (pseudonormalization). Elevated left ventricular  end-diastolic pressure.   Right Ventricle: The right ventricular size is normal. No increase in  right ventricular wall thickness. Right ventricular systolic function is  mildly reduced. Tricuspid regurgitation signal is inadequate for assessing  PA pressure.   Left [facility]: Left atrial size was severely dilated.   Right [facility]: Right atrial size was normal in size.   Pericardium: There is no evidence of pericardial effusion.   Mitral Valve: The mitral valve is abnormal. There is severe calcification  of the anterior and posterior mitral valve leaflet(s). Severe mitral  annular calcification. Mild mitral valve regurgitation. Moderate to severe  mitral valve stenosis. MV peak  gradient, 23.8 mmHg. The mean mitral valve gradient is 10.0 mmHg with  average heart rate of 61 bpm.   Tricuspid Valve: The tricuspid valve is grossly normal. Tricuspid valve  regurgitation is trivial.   Aortic Valve: Heavily calcified and immobile non-coronary cusp. The aortic  valve is tricuspid. Aortic valve regurgitation is trivial. Aortic valve  sclerosis/calcification is present, without any evidence of aortic  stenosis. Aortic valve mean gradient  measures 8.0 mmHg. Aortic valve peak gradient measures 18.8 mmHg. Aortic  valve area, by VTI measures 2.09 cm.   Pulmonic Valve: The pulmonic valve was grossly normal. Pulmonic valve  regurgitation is trivial.   Aorta: The aortic root and ascending aorta are structurally normal, with  no evidence of dilitation.    Venous: The inferior vena cava is normal in size with greater than 50%  respiratory variability, suggesting right atrial pressure of 3 mmHg.   IAS/Shunts: No atrial level shunt detected by color flow Doppler.    Patient Profile     77 y.o. male with a hx of PAF, mitral stenosis, CAD, s/p remote CABG, and AVWB  Assessment & Plan   Acute on chronic diastolic heart failure-started on IV Lasix yesterday 40 mg twice daily total output 400 mL.  Will increase his Lasix to 60 mg twice daily hopefully this will help with his output as he does not have significant lower extremity edema.  Moderate to severe mitral stenosis -contributing to his acute exacerbation of heart failure. Not an ideal surgical candidate but may benefit from evaluation from our structural team once he is euvolemic in the outpatient setting.  Paroxysmal atrial fibrillation not currently on anticoagulation pending watchman workup due to GI bleeding.  Iron deficiency anemia being transfused.     For questions or updates, please contact [facility] Please consult [email or web address] for contact info under Cardiology/STEMI.      Signed, Claudean Leavelle, DO  06/07/2023, 11:08 AM

## 2023-06-08 DIAGNOSIS — I5033 Acute on chronic diastolic (congestive) heart failure: Secondary | ICD-10-CM | POA: Diagnosis not present

## 2023-06-08 LAB — GLUCOSE, CAPILLARY
Glucose-Capillary: 162 mg/dL — ABNORMAL HIGH (ref 70–99)
Glucose-Capillary: 167 mg/dL — ABNORMAL HIGH (ref 70–99)
Glucose-Capillary: 148 mg/dL — ABNORMAL HIGH (ref 70–99)
Glucose-Capillary: 186 mg/dL — ABNORMAL HIGH (ref 70–99)

## 2023-06-08 LAB — CBC
HCT: 31.4 % — ABNORMAL LOW (ref 39.0–52.0)
Hemoglobin: 9 g/dL — ABNORMAL LOW (ref 13.0–17.0)
MCH: 25.1 pg — ABNORMAL LOW (ref 26.0–34.0)
MCHC: 28.7 g/dL — ABNORMAL LOW (ref 30.0–36.0)
MCV: 87.5 fL (ref 80.0–100.0)
Platelets: 228 10*3/uL (ref 150–400)
RBC: 3.59 MIL/uL — ABNORMAL LOW (ref 4.22–5.81)
RDW: 16.2 % — ABNORMAL HIGH (ref 11.5–15.5)
WBC: 4.9 10*3/uL (ref 4.0–10.5)
nRBC: 0 % (ref 0.0–0.2)

## 2023-06-08 LAB — BASIC METABOLIC PANEL
Anion gap: 7 (ref 5–15)
BUN: 19 mg/dL (ref 8–23)
CO2: 32 mmol/L (ref 22–32)
Calcium: 8.8 mg/dL — ABNORMAL LOW (ref 8.9–10.3)
Chloride: 99 mmol/L (ref 98–111)
Creatinine, Ser: 0.94 mg/dL (ref 0.61–1.24)
GFR, Estimated: >60 mL/min (ref >60)
Glucose, Bld: 193 mg/dL — ABNORMAL HIGH (ref 70–99)
Potassium: 4.1 mmol/L (ref 3.5–5.1)
Sodium: 138 mmol/L (ref 135–145)

## 2023-06-08 LAB — TYPE AND SCREEN
ABO/RH(D): O POS
Antibody Screen: POSITIVE
Donor AG Type: NEGATIVE
Unit division: 0

## 2023-06-08 LAB — BPAM RBC
Blood Product Expiration Date: 202408102359
Unit Type and Rh: 5100
Unit Type and Rh: 5100

## 2023-06-08 MED ORDER — BENZONATATE 100 MG PO CAPS
200.0000 mg | ORAL_CAPSULE | Freq: Three times a day (TID) | ORAL | Status: DC | PRN
  Administered 2023-06-08: 200 mg via ORAL
  Filled 2023-06-08: qty 2

## 2023-06-08 MED ORDER — LORATADINE 10 MG PO TABS
10.0000 mg | ORAL_TABLET | Freq: Every day | ORAL | Status: DC
  Administered 2023-06-08 – 2023-06-11 (×4): 10 mg via ORAL
  Filled 2023-06-08 (×4): qty 1

## 2023-06-08 MED ORDER — FLUTICASONE PROPIONATE 50 MCG/ACT NA SUSP
1.0000 | Freq: Every day | NASAL | Status: DC
  Administered 2023-06-08 – 2023-06-11 (×4): 1 via NASAL
  Filled 2023-06-08: qty 16

## 2023-06-08 MED ORDER — PREDNISONE 10 MG PO TABS
10.0000 mg | ORAL_TABLET | Freq: Every day | ORAL | Status: DC
  Filled 2023-06-08: qty 1

## 2023-06-08 NOTE — Progress Notes (Signed)
PROGRESS NOTE    Jeremiah [name]  NWG:[id number] DOB: 1946-04-29 DOA: 06/05/2023 PCP: [name], MD  77/M with history of type 2 diabetes mellitus, diastolic CHF, chronic respiratory failure on 2 to 3 L home O2, CKD 3A, chronic anemia, hypothyroidism, hypertension, COPD, CABG, pulmonary hypertension, OSA and obesity presented to the ED with worsening shortness of breath and anemia. -Ongoing issues with iron deficiency anemia, undergoing workup through [facility], EGD and colonoscopy were unremarkable, due for capsule endoscopy -In the ER hemoglobin was 7.4, troponin 24, BNP 273, chest x-ray with cardiomegaly and suspected edema   Subjective: -Feels better overall, gout is improved  Assessment and Plan:  Acute on chronic respiratory failure with hypoxia Acute on chronic diastolic CHF -Compounded by worsening anemia -Recent echo 2/24 with preserved EF, grade 2 diastolic dysfunction, mildly reduced RV, moderate to severe mitral stenosis -Cardiology consulting, continue IV Lasix, Aldactone -Add SGLT2i tomorrow  Moderate to severe mitral stenosis -Per cards   Iron deficiency anemia, heme positive stools -Ongoing gastroenterology workup, EGD and colonoscopy at [facility] in the last few months was unremarkable, due for "capsule endoscopy soon -Anemia panel confirmed iron deficiency -Suspect chronic intermittent low-grade GI bleeding, continue GI workup at [facility], send referral to outpatient hematology for frequent iron infusions -Taken off anticoagulation few months ago -Transfused 2 units of PRBC this admission, hemoglobin stable, continue iron  Paroxysmal atrial fibrillation -Off anticoagulation due to GI bleeding -Cards consulting, considering Watchman  Gout flare -On account of diuretics and blood, continue colchicine, taper prednisone   COPD - Continue home Yupelri and Pulmicort   OSA - Continue home BiPAP nightly   Diabetes - SSI   CKD 2 -Stable, monitor with  diuretics   Hypothyroidism - Continue Synthroid   Hyperlipidemia CAD/CABG - Continue statin, Zetia, aspirin   Hypertension - Diuresis as above    Obesity - Noted    DVT prophylaxis: SCDs Code Status: Full code Family Communication: None present Disposition Plan: Home likely 48 hours  Consultants:    Procedures:   Antimicrobials:    Objective: Vitals:   06/08/23 0035 06/08/23 0417 06/08/23 0815 06/08/23 1055  BP: 123/61 129/81  (!) 115/53  Pulse: 94 88  76  Resp: 18 19  20   Temp: 98 F (36.7 C) 97.9 F (36.6 C)  97.9 F (36.6 C)  TempSrc: Oral Oral  Oral  SpO2: 97% 95% 93% 96%  Weight:  89.7 kg    Height:        Intake/Output Summary (Last 24 hours) at 06/08/2023 1212 Last data filed at 06/08/2023 0834 Gross per 24 hour  Intake 1531.17 ml  Output 2775 ml  Net -1243.83 ml   Filed Weights   06/06/23 0317 06/07/23 0152 06/08/23 0417  Weight: 91.1 kg 90.5 kg 89.7 kg    Examination:  General exam: Obese pleasant male sitting up in the recliner, AAOx3, no distress HEENT: No JVD CVS: S1-S2, regular rhythm Lungs: Decreased breath sounds at the bases Abdomen: Soft, obese, nontender, bowel sounds present Extremities: 1+ edema  Skin: No rashes Psychiatry:  Mood & affect appropriate.     Data Reviewed:   CBC: Recent Labs  Lab 06/05/23 1518 06/05/23 1549 06/06/23 0013 06/06/23 0705 06/07/23 0038 06/07/23 2107 06/08/23 0029  WBC 4.3  --  3.9*  --  4.2 4.5 4.9  NEUTROABS 2.8  --   --   --   --   --   --   HGB 7.4*   < > 6.8* 7.5* 7.7*  9.4* 9.0*  HCT 27.0*   < > 24.0* 26.0* 27.1* 31.9* 31.4*  MCV 93.1  --  92.0  --  87.4 89.1 87.5  PLT 163  --  168  --  196 216 228   < > = values in this interval not displayed.   Basic Metabolic Panel: Recent Labs  Lab 06/05/23 1518 06/05/23 1549 06/05/23 2035 06/06/23 0013 06/07/23 0038 06/08/23 0029  NA 141 141  --  138 138 138  K 3.9 4.0  --  3.5 3.5 4.1  CL 98 99  --  97* 97* 99  CO2 31  --   --   31 32 32  GLUCOSE 73 73  --  154* 209* 193*  BUN 26* 26*  --  27* 26* 19  CREATININE 0.86 0.90  --  1.02 0.96 0.94  CALCIUM 8.9  --   --  8.3* 8.7* 8.8*  MG  --   --  2.1  --   --   --    GFR: Estimated Creatinine Clearance: 72.7 mL/min (by C-G formula based on SCr of 0.94 mg/dL). Liver Function Tests: Recent Labs  Lab 06/05/23 1518 06/06/23 0013  AST 18 15  ALT 19 17  ALKPHOS 55 53  BILITOT 0.7 0.6  PROT 6.6 6.1*  ALBUMIN 3.0* 2.7*   No results for input(s): "LIPASE", "AMYLASE" in the last 168 hours. No results for input(s): "AMMONIA" in the last 168 hours. Coagulation Profile: No results for input(s): "INR", "PROTIME" in the last 168 hours. Cardiac Enzymes: No results for input(s): "CKTOTAL", "CKMB", "CKMBINDEX", "TROPONINI" in the last 168 hours. BNP (last 3 results) No results for input(s): "PROBNP" in the last 8760 hours. HbA1C: No results for input(s): "HGBA1C" in the last 72 hours. CBG: Recent Labs  Lab 06/07/23 1134 06/07/23 1635 06/07/23 2138 06/08/23 0542 06/08/23 1142  GLUCAP 172* 146* 169* 162* 167*   Lipid Profile: No results for input(s): "CHOL", "HDL", "LDLCALC", "TRIG", "CHOLHDL", "LDLDIRECT" in the last 72 hours. Thyroid Function Tests: No results for input(s): "TSH", "T4TOTAL", "FREET4", "T3FREE", "THYROIDAB" in the last 72 hours. Anemia Panel: Recent Labs    06/06/23 0705  VITAMINB12 194  FOLATE 34.3  FERRITIN 70  TIBC 367  IRON 48  RETICCTPCT 3.8*   Urine analysis:    Component Value Date/Time   COLORURINE YELLOW 02/05/2023 2342   APPEARANCEUR CLEAR 02/05/2023 2342   LABSPEC 1.043 (H) 02/05/2023 2342   PHURINE 6.0 02/05/2023 2342   GLUCOSEU >=500 (A) 02/05/2023 2342   HGBUR NEGATIVE 02/05/2023 2342   BILIRUBINUR NEGATIVE 02/05/2023 2342   KETONESUR 20 (A) 02/05/2023 2342   PROTEINUR NEGATIVE 02/05/2023 2342   NITRITE NEGATIVE 02/05/2023 2342   LEUKOCYTESUR NEGATIVE 02/05/2023 2342   Sepsis  Labs: @LABRCNTIP (procalcitonin:4,lacticidven:4)  )No results found for this or any previous visit (from the past 240 hour(s)).   Radiology Studies: No results found.   Scheduled Meds:  aspirin EC  81 mg Oral Daily   atorvastatin  80 mg Oral Daily   budesonide  0.5 mg Nebulization BID   ezetimibe  10 mg Oral Daily   feeding supplement  237 mL Oral BID BM   furosemide  60 mg Intravenous BID   insulin aspart  0-9 Units Subcutaneous TID WC   levothyroxine  88 mcg Oral Q0600   loratadine  10 mg Oral Daily   pantoprazole  40 mg Oral Daily   predniSONE  10 mg Oral Q breakfast   revefenacin  175 mcg Inhalation BH-q7a  sodium chloride flush  3 mL Intravenous Q12H   spironolactone  12.5 mg Oral Daily   triamcinolone cream   Topical TID   Continuous Infusions:  ferric gluconate (FERRLECIT) IVPB Stopped (06/07/23 1441)     LOS: 2 days    Time spent:    Zannie Cove, MD [facility]   06/08/2023, 12:12 PM

## 2023-06-08 NOTE — Progress Notes (Signed)
Progress Note  Patient Name: Jeremiah [name] Date of Encounter: 06/08/2023  Primary Cardiologist: Chrystie Nose, MD   Subjective   "Feeling great" Still has a lot of fluid onboard  Inpatient Medications    Scheduled Meds:  aspirin EC  81 mg Oral Daily   atorvastatin  80 mg Oral Daily   budesonide  0.5 mg Nebulization BID   ezetimibe  10 mg Oral Daily   feeding supplement  237 mL Oral BID BM   furosemide  60 mg Intravenous BID   insulin aspart  0-9 Units Subcutaneous TID WC   levothyroxine  88 mcg Oral Q0600   loratadine  10 mg Oral Daily   pantoprazole  40 mg Oral Daily   predniSONE  10 mg Oral Q breakfast   revefenacin  175 mcg Inhalation BH-q7a   sodium chloride flush  3 mL Intravenous Q12H   spironolactone  12.5 mg Oral Daily   triamcinolone cream   Topical TID   Continuous Infusions:  ferric gluconate (FERRLECIT) IVPB Stopped (06/07/23 1441)   PRN Meds: acetaminophen **OR** acetaminophen, colchicine, dicyclomine, polyethylene glycol   Vital Signs    Vitals:   06/08/23 0035 06/08/23 0417 06/08/23 0815 06/08/23 1055  BP: 123/61 129/81  (!) 115/53  Pulse: 94 88  76  Resp: 18 19    Temp: 98 F (36.7 C) 97.9 F (36.6 C)  97.9 F (36.6 C)  TempSrc: Oral Oral  Oral  SpO2: 97% 95% 93% 96%  Weight:  89.7 kg    Height:        Intake/Output Summary (Last 24 hours) at 06/08/2023 1058 Last data filed at 06/08/2023 0834 Gross per 24 hour  Intake 1771.17 ml  Output 2775 ml  Net -1003.83 ml   Filed Weights   06/06/23 0317 06/07/23 0152 06/08/23 0417  Weight: 91.1 kg 90.5 kg 89.7 kg    Telemetry    Sinus rhythm. Frequent PACs. Occastional ectopic atrial rhythm. - Personally Reviewed  ECG    7/3 - sinus rhythm with PACs --  Personally Reviewed  Physical Exam   GEN: No acute distress.   Neck: No JVD Cardiac: RRR, no murmurs, rubs, or gallops.  Respiratory: Breathing easily, + rales. GI: Soft, nontender, non-distended  MS: 2+ edema; No deformity. Neuro:   Nonfocal  Psych: Normal affect   Labs    Chemistry Recent Labs  Lab 06/05/23 1518 06/05/23 1549 06/06/23 0013 06/07/23 0038 06/08/23 0029  NA 141   < > 138 138 138  K 3.9   < > 3.5 3.5 4.1  CL 98   < > 97* 97* 99  CO2 31  --  31 32 32  GLUCOSE 73   < > 154* 209* 193*  BUN 26*   < > 27* 26* 19  CREATININE 0.86   < > 1.02 0.96 0.94  CALCIUM 8.9  --  8.3* 8.7* 8.8*  PROT 6.6  --  6.1*  --   --   ALBUMIN 3.0*  --  2.7*  --   --   AST 18  --  15  --   --   ALT 19  --  17  --   --   ALKPHOS 55  --  53  --   --   BILITOT 0.7  --  0.6  --   --   GFRNONAA >60  --  >60 >60 >60  ANIONGAP 12  --  10 9 7    < > = values  in this interval not displayed.     Hematology Recent Labs  Lab 06/07/23 0038 06/07/23 2107 06/08/23 0029  WBC 4.2 4.5 4.9  RBC 3.10* 3.58* 3.59*  HGB 7.7* 9.4* 9.0*  HCT 27.1* 31.9* 31.4*  MCV 87.4 89.1 87.5  MCH 24.8* 26.3 25.1*  MCHC 28.4* 29.5* 28.7*  RDW 16.6* 16.1* 16.2*  PLT 196 216 228    Cardiac EnzymesNo results for input(s): "TROPONINI" in the last 168 hours. No results for input(s): "TROPIPOC" in the last 168 hours.   BNP Recent Labs  Lab 06/05/23 1530  BNP 273.8*     DDimer No results for input(s): "DDIMER" in the last 168 hours.   Summary of Pertinent studies    TTE: pending  Cardiac cath:  Imaging:  Labs: reviewed   Patient Profile     77 y.o. male with a hx of PAF, mitral stenosis, CAD, s/p remote CABG, and AVWB who is being seen for the evaluation of worsening heart failure in the setting of anemia due to GI bleeding.   Assessment & Plan    Acute diastolic heart failure Still volume overloaded Furosemide dose was increased last night; would advance to 80 BID if he isn't more than 2L net negative today  Moderate to severe mitral stenosis Contributing to diastolic dysfunction Will continue to follow as outpatient  AV wenckebach Monitor; no intervention indicated  Paroxysmal atrial fibrillation Anticoagulation  held due to GI bleed  GI bleed, anemia Receiving transfusions   For questions or updates, please contact [facility] Please consult [email or web address] for contact info under Cardiology/STEMI.      Signed, Maurice Small, MD 06/08/2023, 10:58 AM

## 2023-06-09 DIAGNOSIS — I5033 Acute on chronic diastolic (congestive) heart failure: Secondary | ICD-10-CM | POA: Diagnosis not present

## 2023-06-09 LAB — BASIC METABOLIC PANEL
Anion gap: 8 (ref 5–15)
BUN: 26 mg/dL — ABNORMAL HIGH (ref 8–23)
CO2: 31 mmol/L (ref 22–32)
Calcium: 9.3 mg/dL (ref 8.9–10.3)
Chloride: 95 mmol/L — ABNORMAL LOW (ref 98–111)
Creatinine, Ser: 1.03 mg/dL (ref 0.61–1.24)
GFR, Estimated: >60 mL/min (ref >60)
Glucose, Bld: 198 mg/dL — ABNORMAL HIGH (ref 70–99)
Potassium: 4.6 mmol/L (ref 3.5–5.1)
Sodium: 134 mmol/L — ABNORMAL LOW (ref 135–145)

## 2023-06-09 LAB — CBC
HCT: 30.8 % — ABNORMAL LOW (ref 39.0–52.0)
HCT: 30.9 % — ABNORMAL LOW (ref 39.0–52.0)
Hemoglobin: 8.9 g/dL — ABNORMAL LOW (ref 13.0–17.0)
Hemoglobin: 9.2 g/dL — ABNORMAL LOW (ref 13.0–17.0)
MCH: 25.3 pg — ABNORMAL LOW (ref 26.0–34.0)
MCH: 26.9 pg (ref 26.0–34.0)
MCHC: 28.9 g/dL — ABNORMAL LOW (ref 30.0–36.0)
MCHC: 29.8 g/dL — ABNORMAL LOW (ref 30.0–36.0)
MCV: 87.5 fL (ref 80.0–100.0)
MCV: 90.4 fL (ref 80.0–100.0)
Platelets: 245 10*3/uL (ref 150–400)
Platelets: 253 10*3/uL (ref 150–400)
RBC: 3.52 MIL/uL — ABNORMAL LOW (ref 4.22–5.81)
RBC: 3.42 MIL/uL — ABNORMAL LOW (ref 4.22–5.81)
RDW: 16.3 % — ABNORMAL HIGH (ref 11.5–15.5)
RDW: 16.6 % — ABNORMAL HIGH (ref 11.5–15.5)
WBC: 5 10*3/uL (ref 4.0–10.5)
WBC: 4.7 10*3/uL (ref 4.0–10.5)
nRBC: 0 % (ref 0.0–0.2)
nRBC: 0 % (ref 0.0–0.2)

## 2023-06-09 LAB — GLUCOSE, CAPILLARY
Glucose-Capillary: 146 mg/dL — ABNORMAL HIGH (ref 70–99)
Glucose-Capillary: 179 mg/dL — ABNORMAL HIGH (ref 70–99)
Glucose-Capillary: 196 mg/dL — ABNORMAL HIGH (ref 70–99)

## 2023-06-09 NOTE — Progress Notes (Signed)
Patient weaned from 1L nasal cannula to room air. SpO2 96% on room air. SpO2 93% on room air while walking. SpO2 dropped to 88% on room air- Patient able to quickly recover. SpO2 93% on room air upon returning to room. Patient placed back on 1L nasal cannula- SpO2 96%. Will perform walk test again later today.

## 2023-06-09 NOTE — Progress Notes (Signed)
PROGRESS NOTE    Jeremiah [name]  WUJ:[id number] DOB: 01-25-1946 DOA: 06/05/2023 PCP: [name], MD  77/M with history of type 2 diabetes mellitus, diastolic CHF, chronic respiratory failure on 2 to 3 L home O2, CKD 3A, chronic anemia, hypothyroidism, hypertension, COPD, CABG, pulmonary hypertension, OSA and obesity presented to the ED with worsening shortness of breath and anemia. -Ongoing issues with iron deficiency anemia, undergoing workup through [facility], EGD and colonoscopy were unremarkable, due for capsule endoscopy -In the ER hemoglobin was 7.4, troponin 24, BNP 273, chest x-ray with cardiomegaly and suspected edema   Subjective: -Feels well overall, gout pain has resolved, breathing continues to improve  Assessment and Plan:  Acute on chronic respiratory failure with hypoxia Acute on chronic diastolic CHF -Compounded by worsening anemia -Recent echo 2/24 with preserved EF, grade 2 diastolic dysfunction, mildly reduced RV, moderate to severe mitral stenosis -Cardiology consulting, continue IV Lasix, Aldactone -Attempt to wean off O2  Moderate to severe mitral stenosis -Per cards   Iron deficiency anemia, heme positive stools -Ongoing gastroenterology workup, EGD and colonoscopy at [facility] in the last few months was unremarkable, due for on Wednesday 7/10 -Anemia panel confirmed iron deficiency -Suspect chronic intermittent low-grade GI bleeding, continue GI workup at [facility], send referral to outpatient hematology for frequent iron infusions -Taken off anticoagulation few months ago -Transfused 2 units of PRBC this admission, hemoglobin stable, continue iron  Paroxysmal atrial fibrillation -Off anticoagulation due to GI bleeding -Cards consulting, considering Watchman  Gout flare -On account of diuretics and blood, continue colchicine, discontinue prednisone   COPD - Continue home Yupelri and Pulmicort   OSA - Continue home BiPAP nightly   Diabetes -  SSI   CKD 2 -Stable, monitor with diuretics   Hypothyroidism - Continue Synthroid   Hyperlipidemia CAD/CABG - Continue statin, Zetia, aspirin   Hypertension - Diuresis as above    Obesity - Noted    DVT prophylaxis: SCDs Code Status: Full code Family Communication: None present Disposition Plan: Home likely 48 hours  Consultants:    Procedures:   Antimicrobials:    Objective: Vitals:   06/08/23 2300 06/09/23 0412 06/09/23 0806 06/09/23 0940  BP: 114/61 116/77  111/67  Pulse: 83 89  81  Resp: 17 17  18   Temp: 97.8 F (36.6 C) 97.7 F (36.5 C)  97.8 F (36.6 C)  TempSrc: Oral Oral  Oral  SpO2: 94% 98% 98% 95%  Weight:  87.5 kg    Height:        Intake/Output Summary (Last 24 hours) at 06/09/2023 1036 Last data filed at 06/09/2023 0831 Gross per 24 hour  Intake 360 ml  Output 2075 ml  Net -1715 ml   Filed Weights   06/07/23 0152 06/08/23 0417 06/09/23 0412  Weight: 90.5 kg 89.7 kg 87.5 kg    Examination:  General exam: Obese pleasant male sitting up in recliner, AAOx3 HEENT: Positive JVD CVS: S1-S2, regular rhythm Lungs: Decreased breath sounds at the bases Abdomen: Soft, nontender, bowel sounds present Extremities: Trace edema  Skin: No rashes Psychiatry:  Mood & affect appropriate.     Data Reviewed:   CBC: Recent Labs  Lab 06/05/23 1518 06/05/23 1549 06/06/23 0013 06/06/23 0705 06/07/23 0038 06/07/23 2107 06/08/23 0029 06/09/23 0054  WBC 4.3  --  3.9*  --  4.2 4.5 4.9 5.0  NEUTROABS 2.8  --   --   --   --   --   --   --   HGB  7.4*   < > 6.8* 7.5* 7.7* 9.4* 9.0* 8.9*  HCT 27.0*   < > 24.0* 26.0* 27.1* 31.9* 31.4* 30.8*  MCV 93.1  --  92.0  --  87.4 89.1 87.5 87.5  PLT 163  --  168  --  196 216 228 245   < > = values in this interval not displayed.   Basic Metabolic Panel: Recent Labs  Lab 06/05/23 1518 06/05/23 1549 06/05/23 2035 06/06/23 0013 06/07/23 0038 06/08/23 0029 06/09/23 0054  NA 141 141  --  138 138 138 134*   K 3.9 4.0  --  3.5 3.5 4.1 4.6  CL 98 99  --  97* 97* 99 95*  CO2 31  --   --  31 32 32 31  GLUCOSE 73 73  --  154* 209* 193* 198*  BUN 26* 26*  --  27* 26* 19 26*  CREATININE 0.86 0.90  --  1.02 0.96 0.94 1.03  CALCIUM 8.9  --   --  8.3* 8.7* 8.8* 9.3  MG  --   --  2.1  --   --   --   --    GFR: Estimated Creatinine Clearance: 65.6 mL/min (by C-G formula based on SCr of 1.03 mg/dL). Liver Function Tests: Recent Labs  Lab 06/05/23 1518 06/06/23 0013  AST 18 15  ALT 19 17  ALKPHOS 55 53  BILITOT 0.7 0.6  PROT 6.6 6.1*  ALBUMIN 3.0* 2.7*   No results for input(s): "LIPASE", "AMYLASE" in the last 168 hours. No results for input(s): "AMMONIA" in the last 168 hours. Coagulation Profile: No results for input(s): "INR", "PROTIME" in the last 168 hours. Cardiac Enzymes: No results for input(s): "CKTOTAL", "CKMB", "CKMBINDEX", "TROPONINI" in the last 168 hours. BNP (last 3 results) No results for input(s): "PROBNP" in the last 8760 hours. HbA1C: No results for input(s): "HGBA1C" in the last 72 hours. CBG: Recent Labs  Lab 06/08/23 0542 06/08/23 1142 06/08/23 1607 06/08/23 2026 06/09/23 0612  GLUCAP 162* 167* 148* 186* 146*   Lipid Profile: No results for input(s): "CHOL", "HDL", "LDLCALC", "TRIG", "CHOLHDL", "LDLDIRECT" in the last 72 hours. Thyroid Function Tests: No results for input(s): "TSH", "T4TOTAL", "FREET4", "T3FREE", "THYROIDAB" in the last 72 hours. Anemia Panel: No results for input(s): "VITAMINB12", "FOLATE", "FERRITIN", "TIBC", "IRON", "RETICCTPCT" in the last 72 hours.  Urine analysis:    Component Value Date/Time   COLORURINE YELLOW 02/05/2023 2342   APPEARANCEUR CLEAR 02/05/2023 2342   LABSPEC 1.043 (H) 02/05/2023 2342   PHURINE 6.0 02/05/2023 2342   GLUCOSEU >=500 (A) 02/05/2023 2342   HGBUR NEGATIVE 02/05/2023 2342   BILIRUBINUR NEGATIVE 02/05/2023 2342   KETONESUR 20 (A) 02/05/2023 2342   PROTEINUR NEGATIVE 02/05/2023 2342   NITRITE NEGATIVE  02/05/2023 2342   LEUKOCYTESUR NEGATIVE 02/05/2023 2342   Sepsis Labs: @LABRCNTIP (procalcitonin:4,lacticidven:4)  )No results found for this or any previous visit (from the past 240 hour(s)).   Radiology Studies: No results found.   Scheduled Meds:  aspirin EC  81 mg Oral Daily   atorvastatin  80 mg Oral Daily   budesonide  0.5 mg Nebulization BID   ezetimibe  10 mg Oral Daily   feeding supplement  237 mL Oral BID BM   fluticasone  1 spray Each Nare Daily   furosemide  60 mg Intravenous BID   insulin aspart  0-9 Units Subcutaneous TID WC   levothyroxine  88 mcg Oral Q0600   loratadine  10 mg Oral Daily  pantoprazole  40 mg Oral Daily   revefenacin  175 mcg Inhalation BH-q7a   sodium chloride flush  3 mL Intravenous Q12H   spironolactone  12.5 mg Oral Daily   triamcinolone cream   Topical TID   Continuous Infusions:  ferric gluconate (FERRLECIT) IVPB 250 mg (06/08/23 1210)     LOS: 3 days    Time spent:    Zannie Cove, MD [facility]   06/09/2023, 10:36 AM

## 2023-06-09 NOTE — Progress Notes (Signed)
Progress Note  Patient Name: Jeremiah [name] Date of Encounter: 06/09/2023  Primary Cardiologist: Chrystie Nose, MD   Subjective   Feeling better. Ankles are smaller.  -2.5L yesterday  Inpatient Medications    Scheduled Meds:  aspirin EC  81 mg Oral Daily   atorvastatin  80 mg Oral Daily   budesonide  0.5 mg Nebulization BID   ezetimibe  10 mg Oral Daily   feeding supplement  237 mL Oral BID BM   fluticasone  1 spray Each Nare Daily   furosemide  60 mg Intravenous BID   insulin aspart  0-9 Units Subcutaneous TID WC   levothyroxine  88 mcg Oral Q0600   loratadine  10 mg Oral Daily   pantoprazole  40 mg Oral Daily   revefenacin  175 mcg Inhalation BH-q7a   sodium chloride flush  3 mL Intravenous Q12H   spironolactone  12.5 mg Oral Daily   triamcinolone cream   Topical TID   Continuous Infusions:  ferric gluconate (FERRLECIT) IVPB 250 mg (06/08/23 1210)   PRN Meds: acetaminophen **OR** acetaminophen, benzonatate, colchicine, dicyclomine, polyethylene glycol   Vital Signs    Vitals:   06/08/23 2013 06/08/23 2300 06/09/23 0412 06/09/23 0806  BP:  114/61 116/77   Pulse:  83 89   Resp:  17 17   Temp:  97.8 F (36.6 C) 97.7 F (36.5 C)   TempSrc:  Oral Oral   SpO2: 95% 94% 98% 98%  Weight:   87.5 kg   Height:        Intake/Output Summary (Last 24 hours) at 06/09/2023 0937 Last data filed at 06/09/2023 0831 Gross per 24 hour  Intake 360 ml  Output 2075 ml  Net -1715 ml   Filed Weights   06/07/23 0152 06/08/23 0417 06/09/23 0412  Weight: 90.5 kg 89.7 kg 87.5 kg    Telemetry    Sinus rhythm. Frequent PACs. Occastional ectopic atrial rhythm. - Personally Reviewed  ECG    7/3 - sinus rhythm with PACs --  Personally Reviewed  Physical Exam   GEN: No acute distress.   Neck: No JVD Cardiac: RRR, no murmurs, rubs, or gallops.  Respiratory: Breathing easily, + rales. GI: Soft, nontender, non-distended  MS: 2+ edema; No deformity. Neuro:  Nonfocal  Psych:  Normal affect   Labs    Chemistry Recent Labs  Lab 06/05/23 1518 06/05/23 1549 06/06/23 0013 06/07/23 0038 06/08/23 0029 06/09/23 0054  NA 141   < > 138 138 138 134*  K 3.9   < > 3.5 3.5 4.1 4.6  CL 98   < > 97* 97* 99 95*  CO2 31  --  31 32 32 31  GLUCOSE 73   < > 154* 209* 193* 198*  BUN 26*   < > 27* 26* 19 26*  CREATININE 0.86   < > 1.02 0.96 0.94 1.03  CALCIUM 8.9  --  8.3* 8.7* 8.8* 9.3  PROT 6.6  --  6.1*  --   --   --   ALBUMIN 3.0*  --  2.7*  --   --   --   AST 18  --  15  --   --   --   ALT 19  --  17  --   --   --   ALKPHOS 55  --  53  --   --   --   BILITOT 0.7  --  0.6  --   --   --  GFRNONAA >60  --  >60 >60 >60 >60  ANIONGAP 12  --  10 9 7 8    < > = values in this interval not displayed.     Hematology Recent Labs  Lab 06/07/23 2107 06/08/23 0029 06/09/23 0054  WBC 4.5 4.9 5.0  RBC 3.58* 3.59* 3.52*  HGB 9.4* 9.0* 8.9*  HCT 31.9* 31.4* 30.8*  MCV 89.1 87.5 87.5  MCH 26.3 25.1* 25.3*  MCHC 29.5* 28.7* 28.9*  RDW 16.1* 16.2* 16.3*  PLT 216 228 245    Cardiac EnzymesNo results for input(s): "TROPONINI" in the last 168 hours. No results for input(s): "TROPIPOC" in the last 168 hours.   BNP Recent Labs  Lab 06/05/23 1530  BNP 273.8*     DDimer No results for input(s): "DDIMER" in the last 168 hours.   Summary of Pertinent studies    TTE: pending  Cardiac cath:  Imaging:  Labs: reviewed   Patient Profile     77 y.o. male with a hx of PAF, mitral stenosis, CAD, s/p remote CABG, and AVWB who is being seen for the evaluation of worsening heart failure in the setting of anemia due to GI bleeding.   Assessment & Plan    Acute diastolic heart failure Still volume overloaded Continue furosemide IV 60 BID  Moderate to severe mitral stenosis Contributing to diastolic dysfunction Will continue to follow as outpatient  AV wenckebach Monitor; no intervention indicated -- I think much of this is blocked PACs, Multiple PACs noted,  probably wandering atrial pacemaker.  Paroxysmal atrial fibrillation Anticoagulation held due to GI bleed  GI bleed, anemia Received transfusions   For questions or updates, please contact [facility] Please consult [email or web address] for contact info under Cardiology/STEMI.      Signed, Maurice Small, MD 06/09/2023, 9:37 AM

## 2023-06-09 NOTE — Progress Notes (Signed)
   06/09/23 0012  BiPAP/CPAP/SIPAP  Reason BIPAP/CPAP not in use Non-compliant   Refused. Patient has been diagnosed with seep apnea, and spoke to patient at length about cpap, he stated he has tried many times and he does not like to wear the cpap.

## 2023-06-10 DIAGNOSIS — Z951 Presence of aortocoronary bypass graft: Secondary | ICD-10-CM | POA: Diagnosis not present

## 2023-06-10 DIAGNOSIS — D500 Iron deficiency anemia secondary to blood loss (chronic): Secondary | ICD-10-CM | POA: Diagnosis not present

## 2023-06-10 DIAGNOSIS — I5033 Acute on chronic diastolic (congestive) heart failure: Secondary | ICD-10-CM | POA: Diagnosis not present

## 2023-06-10 LAB — BASIC METABOLIC PANEL
Anion gap: 7 (ref 5–15)
BUN: 32 mg/dL — ABNORMAL HIGH (ref 8–23)
CO2: 33 mmol/L — ABNORMAL HIGH (ref 22–32)
Calcium: 9.1 mg/dL (ref 8.9–10.3)
Chloride: 96 mmol/L — ABNORMAL LOW (ref 98–111)
Creatinine, Ser: 1.3 mg/dL — ABNORMAL HIGH (ref 0.61–1.24)
GFR, Estimated: 57 mL/min — ABNORMAL LOW (ref >60)
Glucose, Bld: 193 mg/dL — ABNORMAL HIGH (ref 70–99)
Potassium: 3.6 mmol/L (ref 3.5–5.1)
Sodium: 136 mmol/L (ref 135–145)

## 2023-06-10 LAB — GLUCOSE, CAPILLARY
Glucose-Capillary: 177 mg/dL — ABNORMAL HIGH (ref 70–99)
Glucose-Capillary: 189 mg/dL — ABNORMAL HIGH (ref 70–99)
Glucose-Capillary: 176 mg/dL — ABNORMAL HIGH (ref 70–99)
Glucose-Capillary: 157 mg/dL — ABNORMAL HIGH (ref 70–99)
Glucose-Capillary: 171 mg/dL — ABNORMAL HIGH (ref 70–99)

## 2023-06-10 LAB — CBC
HCT: 29.2 % — ABNORMAL LOW (ref 39.0–52.0)
HCT: 30.4 % — ABNORMAL LOW (ref 39.0–52.0)
Hemoglobin: 8.5 g/dL — ABNORMAL LOW (ref 13.0–17.0)
Hemoglobin: 8.9 g/dL — ABNORMAL LOW (ref 13.0–17.0)
MCH: 25.6 pg — ABNORMAL LOW (ref 26.0–34.0)
MCH: 26.4 pg (ref 26.0–34.0)
MCHC: 29.1 g/dL — ABNORMAL LOW (ref 30.0–36.0)
MCHC: 29.3 g/dL — ABNORMAL LOW (ref 30.0–36.0)
MCV: 88 fL (ref 80.0–100.0)
MCV: 90.2 fL (ref 80.0–100.0)
Platelets: 245 10*3/uL (ref 150–400)
Platelets: 294 10*3/uL (ref 150–400)
RBC: 3.32 MIL/uL — ABNORMAL LOW (ref 4.22–5.81)
RBC: 3.37 MIL/uL — ABNORMAL LOW (ref 4.22–5.81)
RDW: 16.9 % — ABNORMAL HIGH (ref 11.5–15.5)
RDW: 17.2 % — ABNORMAL HIGH (ref 11.5–15.5)
WBC: 4 10*3/uL (ref 4.0–10.5)
WBC: 4.9 10*3/uL (ref 4.0–10.5)
nRBC: 0 % (ref 0.0–0.2)
nRBC: 0 % (ref 0.0–0.2)

## 2023-06-10 MED ORDER — POTASSIUM CHLORIDE CRYS ER 20 MEQ PO TBCR
40.0000 meq | EXTENDED_RELEASE_TABLET | Freq: Once | ORAL | Status: AC
  Administered 2023-06-10: 40 meq via ORAL
  Filled 2023-06-10: qty 2

## 2023-06-10 MED ORDER — FUROSEMIDE 40 MG PO TABS
80.0000 mg | ORAL_TABLET | Freq: Every day | ORAL | Status: DC
  Administered 2023-06-11: 80 mg via ORAL
  Filled 2023-06-10: qty 2

## 2023-06-10 NOTE — Progress Notes (Signed)
Mobility Specialist Progress Note:   06/10/23 1300  Mobility  Activity Ambulated independently in hallway  Level of Assistance Independent  Assistive Device None  Distance Ambulated (ft) 500 ft  Activity Response Tolerated well  Mobility Referral Yes  $Mobility charge 1 Mobility  Mobility Specialist Start Time (ACUTE ONLY) 1300  Mobility Specialist Stop Time (ACUTE ONLY) 1312  Mobility Specialist Time Calculation (min) (ACUTE ONLY) 12 min   Pt agreeable to mobility session. VSS on RA throughout ambulation. Pt back in chair with all needs met.   Addison Lank Mobility Specialist Please contact via SecureChat or  Rehab office at [phone]

## 2023-06-10 NOTE — Plan of Care (Signed)
  Problem: Education: Goal: Ability to demonstrate management of disease process will improve Outcome: Progressing Goal: Ability to verbalize understanding of medication therapies will improve Outcome: Progressing Goal: Individualized Educational Video(s) Outcome: Progressing   Problem: Activity: Goal: Capacity to carry out activities will improve Outcome: Progressing   Problem: Cardiac: Goal: Ability to achieve and maintain adequate cardiopulmonary perfusion will improve Outcome: Progressing   Problem: Education: Goal: Ability to describe self-care measures that may prevent or decrease complications (Diabetes Survival Skills Education) will improve Outcome: Progressing Goal: Individualized Educational Video(s) Outcome: Progressing   Problem: Coping: Goal: Ability to adjust to condition or change in health will improve Outcome: Progressing   Problem: Fluid Volume: Goal: Ability to maintain a balanced intake and output will improve Outcome: Progressing   Problem: Health Behavior/Discharge Planning: Goal: Ability to identify and utilize available resources and services will improve Outcome: Progressing Goal: Ability to manage health-related needs will improve Outcome: Progressing   Problem: Metabolic: Goal: Ability to maintain appropriate glucose levels will improve Outcome: Progressing   Problem: Nutritional: Goal: Maintenance of adequate nutrition will improve Outcome: Progressing Goal: Progress toward achieving an optimal weight will improve Outcome: Progressing   Problem: Skin Integrity: Goal: Risk for impaired skin integrity will decrease Outcome: Progressing   Problem: Tissue Perfusion: Goal: Adequacy of tissue perfusion will improve Outcome: Progressing   Problem: Education: Goal: Knowledge of General Education information will improve Description: Including pain rating scale, medication(s)/side effects and non-pharmacologic comfort measures Outcome:  Progressing   Problem: Health Behavior/Discharge Planning: Goal: Ability to manage health-related needs will improve Outcome: Progressing   Problem: Clinical Measurements: Goal: Ability to maintain clinical measurements within normal limits will improve Outcome: Progressing Goal: Will remain free from infection Outcome: Progressing Goal: Diagnostic test results will improve Outcome: Progressing Goal: Respiratory complications will improve Outcome: Progressing Goal: Cardiovascular complication will be avoided Outcome: Progressing   Problem: Activity: Goal: Risk for activity intolerance will decrease Outcome: Progressing   Problem: Coping: Goal: Level of anxiety will decrease Outcome: Progressing

## 2023-06-10 NOTE — Progress Notes (Signed)
Nurse requested Mobility Specialist to perform oxygen saturation test with pt which includes removing pt from oxygen both at rest and while ambulating.  Below are the results from that testing.     Patient Saturations on Room Air at Rest = spO2 99%  Patient Saturations on Room Air while Ambulating = sp02 94% .  Patient Saturations on N/A Liters of oxygen while Ambulating = sp02 N/A%  At end of testing pt left in room on RA.  Reported results to nurse.   Addison Lank Mobility Specialist Please contact via SecureChat or  Rehab office at [phone]

## 2023-06-10 NOTE — Progress Notes (Signed)
PROGRESS NOTE    Jeremiah [name]  ZOX:[id number] DOB: [birth year] DOA: 06/05/2023 PCP: [name], MD  77/M with history of type 2 diabetes mellitus, diastolic CHF, chronic respiratory failure on 2 to 3 L home O2, CKD 3A, chronic anemia, hypothyroidism, hypertension, COPD, CABG, pulmonary hypertension, OSA and obesity presented to the ED with worsening shortness of breath and anemia. -Ongoing issues with iron deficiency anemia, undergoing workup through [facility], EGD and colonoscopy were unremarkable, due for capsule endoscopy -In the ER hemoglobin was 7.4, troponin 24, BNP 273, chest x-ray with cardiomegaly and suspected edema   Subjective: -Upset about hemoglobin being slightly lower  Assessment and Plan:  Acute on chronic respiratory failure with hypoxia Acute on chronic diastolic CHF -Compounded by worsening anemia -Recent echo 2/24 with preserved EF, grade 2 diastolic dysfunction, mildly reduced RV, moderate to severe mitral stenosis -Volume status has improved, down 15 pounds -Almost weaned weaned off O2  Moderate to severe mitral stenosis -Seen by structural heart team, suspected to need complex intervention possibly at [facility] in [place]   Iron deficiency anemia, heme positive stools -Ongoing gastroenterology workup, EGD and colonoscopy at [facility] in the last few months was unremarkable, due for on Wednesday 7/10 -Anemia panel confirmed iron deficiency -Suspect chronic intermittent low-grade GI bleeding, continue GI workup at [facility], send referral to outpatient hematology for frequent iron infusions -Taken off anticoagulation few months ago -Transfused 2 units of PRBC this admission, hemoglobin stable, continue iron -Hemoglobin slightly lower compared to yesterday, check CBC this afternoon and in a.m.  Paroxysmal atrial fibrillation -Off anticoagulation due to GI bleeding -Cards consulting, considering Watchman  Gout flare -On account of diuretics and blood,  continue colchicine, discontinue prednisone   COPD - Continue home Yupelri and Pulmicort   OSA - Continue home BiPAP nightly   Diabetes - SSI   CKD 2 -Stable, monitor with diuretics   Hypothyroidism - Continue Synthroid   Hyperlipidemia CAD/CABG - Continue statin, Zetia, aspirin   Hypertension - Diuresis as above    Obesity - Noted    DVT prophylaxis: SCDs Code Status: Full code Family Communication: Wife at bedside Disposition Plan: Home tomorrow  Consultants:    Procedures:   Antimicrobials:    Objective: Vitals:   06/10/23 0605 06/10/23 0745 06/10/23 1003 06/10/23 1156  BP: 129/61  (!) 123/42 (!) 101/52  Pulse: 90  80 85  Resp: 18  18 18   Temp: 97.7 F (36.5 C)  97.9 F (36.6 C) 97.9 F (36.6 C)  TempSrc: Oral  Oral Oral  SpO2: 99% 96% 96% 95%  Weight: 87.1 kg     Height:        Intake/Output Summary (Last 24 hours) at 06/10/2023 1159 Last data filed at 06/10/2023 1017 Gross per 24 hour  Intake 1240 ml  Output 575 ml  Net 665 ml   Filed Weights   06/08/23 0417 06/09/23 0412 06/10/23 0605  Weight: 89.7 kg 87.5 kg 87.1 kg    Examination:  General exam: Obese pleasant male sitting up in bed, AAOx3 HEENT: No JVD CVS: S1-S2, regular rhythm systolic murmur Lungs: Decreased breath sounds to bases Abdomen: Soft, nontender, bowel sounds present Extremities: Trace edema Skin: No rashes Psychiatry:  Mood & affect appropriate.     Data Reviewed:   CBC: Recent Labs  Lab 06/05/23 1518 06/05/23 1549 06/07/23 2107 06/08/23 0029 06/09/23 0054 06/09/23 1631 06/10/23 0058  WBC 4.3   < > 4.5 4.9 5.0 4.7 4.0  NEUTROABS 2.8  --   --   --   --   --   --  HGB 7.4*   < > 9.4* 9.0* 8.9* 9.2* 8.5*  HCT 27.0*   < > 31.9* 31.4* 30.8* 30.9* 29.2*  MCV 93.1   < > 89.1 87.5 87.5 90.4 88.0  PLT 163   < > 216 228 245 253 245   < > = values in this interval not displayed.   Basic Metabolic Panel: Recent Labs  Lab 06/05/23 2035 06/06/23 0013  06/07/23 0038 06/08/23 0029 06/09/23 0054 06/10/23 0058  NA  --  138 138 138 134* 136  K  --  3.5 3.5 4.1 4.6 3.6  CL  --  97* 97* 99 95* 96*  CO2  --  31 32 32 31 33*  GLUCOSE  --  154* 209* 193* 198* 193*  BUN  --  27* 26* 19 26* 32*  CREATININE  --  1.02 0.96 0.94 1.03 1.30*  CALCIUM  --  8.3* 8.7* 8.8* 9.3 9.1  MG 2.1  --   --   --   --   --    GFR: Estimated Creatinine Clearance: 51.9 mL/min (A) (by C-G formula based on SCr of 1.3 mg/dL (H)). Liver Function Tests: Recent Labs  Lab 06/05/23 1518 06/06/23 0013  AST 18 15  ALT 19 17  ALKPHOS 55 53  BILITOT 0.7 0.6  PROT 6.6 6.1*  ALBUMIN 3.0* 2.7*   No results for input(s): "LIPASE", "AMYLASE" in the last 168 hours. No results for input(s): "AMMONIA" in the last 168 hours. Coagulation Profile: No results for input(s): "INR", "PROTIME" in the last 168 hours. Cardiac Enzymes: No results for input(s): "CKTOTAL", "CKMB", "CKMBINDEX", "TROPONINI" in the last 168 hours. BNP (last 3 results) No results for input(s): "PROBNP" in the last 8760 hours. HbA1C: No results for input(s): "HGBA1C" in the last 72 hours. CBG: Recent Labs  Lab 06/09/23 1103 06/09/23 1614 06/09/23 2114 06/10/23 0621 06/10/23 1150  GLUCAP 179* 196* 177* 189* 176*   Lipid Profile: No results for input(s): "CHOL", "HDL", "LDLCALC", "TRIG", "CHOLHDL", "LDLDIRECT" in the last 72 hours. Thyroid Function Tests: No results for input(s): "TSH", "T4TOTAL", "FREET4", "T3FREE", "THYROIDAB" in the last 72 hours. Anemia Panel: No results for input(s): "VITAMINB12", "FOLATE", "FERRITIN", "TIBC", "IRON", "RETICCTPCT" in the last 72 hours.  Urine analysis:    Component Value Date/Time   COLORURINE YELLOW 02/05/2023 2342   APPEARANCEUR CLEAR 02/05/2023 2342   LABSPEC 1.043 (H) 02/05/2023 2342   PHURINE 6.0 02/05/2023 2342   GLUCOSEU >=500 (A) 02/05/2023 2342   HGBUR NEGATIVE 02/05/2023 2342   BILIRUBINUR NEGATIVE 02/05/2023 2342   KETONESUR 20 (A)  02/05/2023 2342   PROTEINUR NEGATIVE 02/05/2023 2342   NITRITE NEGATIVE 02/05/2023 2342   LEUKOCYTESUR NEGATIVE 02/05/2023 2342   Sepsis Labs: @LABRCNTIP (procalcitonin:4,lacticidven:4)  )No results found for this or any previous visit (from the past 240 hour(s)).   Radiology Studies: No results found.   Scheduled Meds:  aspirin EC  81 mg Oral Daily   atorvastatin  80 mg Oral Daily   budesonide  0.5 mg Nebulization BID   ezetimibe  10 mg Oral Daily   feeding supplement  237 mL Oral BID BM   fluticasone  1 spray Each Nare Daily   [START ON 06/11/2023] furosemide  80 mg Oral Daily   insulin aspart  0-9 Units Subcutaneous TID WC   levothyroxine  88 mcg Oral Q0600   loratadine  10 mg Oral Daily   pantoprazole  40 mg Oral Daily   revefenacin  175 mcg Inhalation BH-q7a   sodium  chloride flush  3 mL Intravenous Q12H   spironolactone  12.5 mg Oral Daily   triamcinolone cream   Topical TID   Continuous Infusions:     LOS: 4 days    Time spent:    Zannie Cove, MD [facility]   06/10/2023, 11:59 AM

## 2023-06-10 NOTE — TOC Initial Note (Signed)
Transition of Care [facility]) - Initial/Assessment Note    Patient Details  Name: Jeremiah [name] MRN: [id number] Date of Birth: [birth year]  Transition of Care [facility]) CM/SW Contact:    Ronny Bacon, RN Phone Number: 06/10/2023, 2:16 PM  Clinical Narrative:  Patient from home with spouse. Patient has home oxygen through [employer] for over 5 years, but would like to change to a different oxygen company due to having difficulty getting oxygen delivered in timely manner leaving patient without oxygen supply. Reached out to [employer] to see if patient would be eligible for getting oxygen supplied through them. Oxygen could be supplied by [employer], will need new oxygen orders.  Wife will transport patient home at discharge.                 Expected Discharge Plan: Home/Self Care Barriers to Discharge: Continued Medical Work up   Patient Goals and CMS Choice Patient states their goals for this hospitalization and ongoing recovery are:: To get oxygen from different company and to go home          Expected Discharge Plan and Services   Discharge Planning Services: CM Consult                                          Prior Living Arrangements/Services   Lives with:: Spouse Patient language and need for interpreter reviewed:: Yes Do you feel safe going back to the place where you live?: Yes      Need for Family Participation in Patient Care: No (Comment) Care giver support system in place?: Yes (comment) Current home services: DME (Oxygen through [employer], but wants new oxygen company) Criminal Activity/Legal Involvement Pertinent to Current Situation/Hospitalization: No - Comment as needed  Activities of Daily Living Home Assistive Devices/Equipment: Eyeglasses ADL Screening (condition at time of admission) Patient's cognitive ability adequate to safely complete daily activities?: Yes Is the patient deaf or have difficulty hearing?: Yes Does the patient have difficulty  seeing, even when wearing glasses/contacts?: No Does the patient have difficulty concentrating, remembering, or making decisions?: No Patient able to express need for assistance with ADLs?: Yes Does the patient have difficulty dressing or bathing?: No Independently performs ADLs?: Yes (appropriate for developmental age) Does the patient have difficulty walking or climbing stairs?: Yes Weakness of Legs: None Weakness of Arms/Hands: None  Permission Sought/Granted Permission sought to share information with : Case Manager, Photographer granted to share info w AGENCY: [employer]        Emotional Assessment Appearance:: Appears stated age Attitude/Demeanor/Rapport: Engaged Affect (typically observed): Appropriate Orientation: : Oriented to Self, Oriented to Place, Oriented to  Time, Oriented to Situation Alcohol / Substance Use: Not Applicable Psych Involvement: No (comment)  Admission diagnosis:  Acute on chronic diastolic CHF (congestive heart failure) (HCC) [I50.33] Dyspnea, unspecified type [R06.00] Systolic congestive heart failure, unspecified HF chronicity (HCC) [I50.20] Patient Active Problem List   Diagnosis Date Noted   Chronic diastolic (congestive) heart failure (HCC) 06/05/2023   Acute on chronic respiratory failure with hypoxia (HCC) 06/05/2023   Acute on chronic diastolic CHF (congestive heart failure) (HCC) 06/05/2023   Ischemic colitis (HCC) 02/18/2023   Pulmonary hypertension, unspecified (HCC) 02/18/2023   New onset atrial fibrillation (HCC) 02/18/2023   Nonrheumatic mitral valve stenosis 02/18/2023   Iron deficiency anemia 10/07/2022   Former smoker 08/21/2022  Incarcerated umbilical hernia 05/26/2022   History of supraventricular tachycardia 05/26/2022   Type 2 diabetes mellitus with hyperglycemia, without long-term current use of insulin (HCC) 04/29/2020   Carotid artery calcification 09/09/2018   Coronary artery disease  involving coronary bypass graft of native heart without angina pectoris 07/22/2017   COPD GOLD II with reversibility 04/03/2017   Chronic respiratory failure with hypoxia and hypercapnia (HCC) 04/03/2017   Hyperkalemia 02/26/2017   Prolonged QT interval 02/26/2017   Skin abscess 02/13/2017   Dyspnea on exertion 02/12/2017   Enteritis due to Clostridium difficile 01/05/2017   GI bleed 01/03/2017   Symptomatic anemia 01/03/2017   Hypothyroidism, acquired 01/03/2017   Cough    Diarrhea    AVNRT (AV nodal re-entry tachycardia) 08/24/2016   NSTEMI (non-ST elevated myocardial infarction) (HCC) 05/05/2016   SVT (supraventricular tachycardia) 03/21/2016   S/P CABG x 3 03/21/2016   Morbid obesity due to excess calories (HCC) complicated by hbp/hyperlipidemia  03/21/2016   Essential hypertension 03/21/2016   Hyperlipidemia LDL goal <70 03/21/2016   Paronychia 03/21/2016   Tobacco dependence 03/21/2016   Pain in right ankle and joints of right foot 03/21/2016   Chronic pain 03/21/2016   OSA (obstructive sleep apnea) 03/21/2016   Medication management 03/21/2016   History of prostate cancer 03/21/2016   Primary insomnia 03/21/2016   Post-traumatic osteoarthritis of right ankle 11/07/2015   Closed displaced fracture of medial malleolus of right tibia 10/24/2015   PCP:  [name], MD Pharmacy:   [facility] [phone] [place], [place] - [address] AT [facility] [address] [facility] [address] [place] [address] Phone: [phone] Fax: [phone]  [facility] [address] [place] [address] Phone: [phone] Fax: [phone]     Social Determinants of Health (SDOH) Social History: SDOH Screenings   Food Insecurity: No Food Insecurity (06/05/2023)  Housing: Low Risk  (06/05/2023)  Transportation Needs: No Transportation Needs (06/05/2023)  Utilities: Not At Risk (06/05/2023)  Tobacco Use: Medium Risk (06/05/2023)    SDOH Interventions:     Readmission Risk Interventions     No data to display

## 2023-06-10 NOTE — Plan of Care (Signed)
  Problem: Nutrition: Goal: Adequate nutrition will be maintained Outcome: Completed/Met   Problem: Elimination: Goal: Will not experience complications related to bowel motility Outcome: Completed/Met Goal: Will not experience complications related to urinary retention Outcome: Completed/Met   Problem: Pain Managment: Goal: General experience of comfort will improve Outcome: Completed/Met   Problem: Safety: Goal: Ability to remain free from injury will improve Outcome: Completed/Met   Problem: Skin Integrity: Goal: Risk for impaired skin integrity will decrease Outcome: Completed/Met   

## 2023-06-10 NOTE — Progress Notes (Signed)
Progress Note  Patient Name: Jeremiah [name] Date of Encounter: 06/10/2023  Primary Cardiologist: Chrystie Nose, MD   Subjective   Minimally net negative recorded overnight- weight stable at 87 kg. Creatinine has trended up to 1.30 (from 1.03).  Inpatient Medications    Scheduled Meds:  aspirin EC  81 mg Oral Daily   atorvastatin  80 mg Oral Daily   budesonide  0.5 mg Nebulization BID   ezetimibe  10 mg Oral Daily   feeding supplement  237 mL Oral BID BM   fluticasone  1 spray Each Nare Daily   furosemide  60 mg Intravenous BID   insulin aspart  0-9 Units Subcutaneous TID WC   levothyroxine  88 mcg Oral Q0600   loratadine  10 mg Oral Daily   pantoprazole  40 mg Oral Daily   potassium chloride  40 mEq Oral Once   revefenacin  175 mcg Inhalation BH-q7a   sodium chloride flush  3 mL Intravenous Q12H   spironolactone  12.5 mg Oral Daily   triamcinolone cream   Topical TID   Continuous Infusions:   PRN Meds: acetaminophen **OR** acetaminophen, benzonatate, colchicine, dicyclomine, polyethylene glycol   Vital Signs    Vitals:   06/09/23 1916 06/09/23 2320 06/10/23 0605 06/10/23 0745  BP: 108/62 (!) 103/58 129/61   Pulse: 96 85 90   Resp: 18 18 18    Temp: 98.1 F (36.7 C) 98.3 F (36.8 C) 97.7 F (36.5 C)   TempSrc: Oral Oral Oral   SpO2: 94% 97% 99% 96%  Weight:   87.1 kg   Height:        Intake/Output Summary (Last 24 hours) at 06/10/2023 0848 Last data filed at 06/10/2023 0600 Gross per 24 hour  Intake 440 ml  Output 575 ml  Net -135 ml   Filed Weights   06/08/23 0417 06/09/23 0412 06/10/23 0605  Weight: 89.7 kg 87.5 kg 87.1 kg    Telemetry    Sinus rhythm - Personally Reviewed  ECG    7/3 - sinus rhythm with PACs --  Personally Reviewed  Physical Exam   GEN: No acute distress.   Neck: No JVD Cardiac: RRR, no murmurs, rubs, or gallops.  Respiratory: Breathing easily, + rales. GI: Soft, nontender, non-distended  MS: 1+ edema; No  deformity. Neuro:  Nonfocal  Psych: Normal affect   Labs    Chemistry Recent Labs  Lab 06/05/23 1518 06/05/23 1549 06/06/23 0013 06/07/23 0038 06/08/23 0029 06/09/23 0054 06/10/23 0058  NA 141   < > 138   < > 138 134* 136  K 3.9   < > 3.5   < > 4.1 4.6 3.6  CL 98   < > 97*   < > 99 95* 96*  CO2 31  --  31   < > 32 31 33*  GLUCOSE 73   < > 154*   < > 193* 198* 193*  BUN 26*   < > 27*   < > 19 26* 32*  CREATININE 0.86   < > 1.02   < > 0.94 1.03 1.30*  CALCIUM 8.9  --  8.3*   < > 8.8* 9.3 9.1  PROT 6.6  --  6.1*  --   --   --   --   ALBUMIN 3.0*  --  2.7*  --   --   --   --   AST 18  --  15  --   --   --   --  ALT 19  --  17  --   --   --   --   ALKPHOS 55  --  53  --   --   --   --   BILITOT 0.7  --  0.6  --   --   --   --   GFRNONAA >60  --  >60   < > >60 >60 57*  ANIONGAP 12  --  10   < > 7 8 7    < > = values in this interval not displayed.     Hematology Recent Labs  Lab 06/09/23 0054 06/09/23 1631 06/10/23 0058  WBC 5.0 4.7 4.0  RBC 3.52* 3.42* 3.32*  HGB 8.9* 9.2* 8.5*  HCT 30.8* 30.9* 29.2*  MCV 87.5 90.4 88.0  MCH 25.3* 26.9 25.6*  MCHC 28.9* 29.8* 29.1*  RDW 16.3* 16.6* 16.9*  PLT 245 253 245    Cardiac EnzymesNo results for input(s): "TROPONINI" in the last 168 hours. No results for input(s): "TROPIPOC" in the last 168 hours.   BNP Recent Labs  Lab 06/05/23 1530  BNP 273.8*     DDimer No results for input(s): "DDIMER" in the last 168 hours.   Summary of Pertinent studies    TTE: pending outpatient in 2 weeks  Patient Profile     77 y.o. male with a hx of PAF, mitral stenosis, CAD, s/p remote CABG, and AVWB who is being seen for the evaluation of worsening heart failure in the setting of anemia due to GI bleeding.   Assessment & Plan    Acute diastolic heart failure Minimal net negative overnight- creatinine increased. Hold IV lasix today to allow renal recovery- transition to oral lasix 80 mg daily tomorrow.  Moderate to severe  mitral stenosis Contributing to diastolic dysfunction Already seen by structural heart team - suspect he may need complex intervention possibly at [facility]; no intervention indicated -- I think much of this is blocked PACs, Multiple PACs noted, probably wandering atrial pacemaker. Telemetry shows mostly sinus rhythm.  Paroxysmal atrial fibrillation Anticoagulation held due to GI bleed  GI bleed, anemia Received transfusions - Hemoglobin stable. Per pt, GI at [facility] discussion possible capsule endoscopy later in the week.   For questions or updates, please contact [facility] Please consult [email or web address] for contact info under Cardiology/STEMI.   Chrystie Nose, MD, [facility], FACP  [place]  [facility]  Medical Director of the [facility] Diplomate of the American Board of Clinical Lipidology Attending Cardiologist  Direct Dial: [phone]  Fax: [phone]  Website:  [email or web address]  Chrystie Nose, MD 06/10/2023, 8:48 AM

## 2023-06-11 ENCOUNTER — Other Ambulatory Visit (HOSPITAL_COMMUNITY): Payer: Self-pay

## 2023-06-11 DIAGNOSIS — D500 Iron deficiency anemia secondary to blood loss (chronic): Secondary | ICD-10-CM | POA: Diagnosis not present

## 2023-06-11 DIAGNOSIS — I5033 Acute on chronic diastolic (congestive) heart failure: Secondary | ICD-10-CM | POA: Diagnosis not present

## 2023-06-11 LAB — BASIC METABOLIC PANEL
Anion gap: 13 (ref 5–15)
BUN: 33 mg/dL — ABNORMAL HIGH (ref 8–23)
CO2: 30 mmol/L (ref 22–32)
Calcium: 9.6 mg/dL (ref 8.9–10.3)
Chloride: 95 mmol/L — ABNORMAL LOW (ref 98–111)
Creatinine, Ser: 0.94 mg/dL (ref 0.61–1.24)
GFR, Estimated: >60 mL/min (ref >60)
Glucose, Bld: 178 mg/dL — ABNORMAL HIGH (ref 70–99)
Potassium: 4.3 mmol/L (ref 3.5–5.1)
Sodium: 138 mmol/L (ref 135–145)

## 2023-06-11 LAB — CBC
HCT: 28.4 % — ABNORMAL LOW (ref 39.0–52.0)
HCT: 30.2 % — ABNORMAL LOW (ref 39.0–52.0)
Hemoglobin: 8.2 g/dL — ABNORMAL LOW (ref 13.0–17.0)
Hemoglobin: 9 g/dL — ABNORMAL LOW (ref 13.0–17.0)
MCH: 26 pg (ref 26.0–34.0)
MCH: 26.9 pg (ref 26.0–34.0)
MCHC: 28.9 g/dL — ABNORMAL LOW (ref 30.0–36.0)
MCHC: 29.8 g/dL — ABNORMAL LOW (ref 30.0–36.0)
MCV: 90.2 fL (ref 80.0–100.0)
MCV: 90.1 fL (ref 80.0–100.0)
Platelets: 249 10*3/uL (ref 150–400)
Platelets: 273 10*3/uL (ref 150–400)
RBC: 3.15 MIL/uL — ABNORMAL LOW (ref 4.22–5.81)
RBC: 3.35 MIL/uL — ABNORMAL LOW (ref 4.22–5.81)
RDW: 17.3 % — ABNORMAL HIGH (ref 11.5–15.5)
RDW: 17.7 % — ABNORMAL HIGH (ref 11.5–15.5)
WBC: 4.5 10*3/uL (ref 4.0–10.5)
WBC: 4.6 10*3/uL (ref 4.0–10.5)
nRBC: 0 % (ref 0.0–0.2)
nRBC: 0 % (ref 0.0–0.2)

## 2023-06-11 LAB — GLUCOSE, CAPILLARY: Glucose-Capillary: 144 mg/dL — ABNORMAL HIGH (ref 70–99)

## 2023-06-11 MED ORDER — SPIRONOLACTONE 25 MG PO TABS
25.0000 mg | ORAL_TABLET | Freq: Every day | ORAL | 0 refills | Status: DC
  Filled 2023-06-11: qty 30, 30d supply, fill #0

## 2023-06-11 MED ORDER — POTASSIUM CHLORIDE CRYS ER 20 MEQ PO TBCR
20.0000 meq | EXTENDED_RELEASE_TABLET | Freq: Every day | ORAL | 0 refills | Status: DC
  Filled 2023-06-11: qty 30, 30d supply, fill #0

## 2023-06-11 MED ORDER — FUROSEMIDE 40 MG PO TABS
80.0000 mg | ORAL_TABLET | Freq: Every day | ORAL | 0 refills | Status: DC
  Filled 2023-06-11: qty 30, 15d supply, fill #0

## 2023-06-11 NOTE — TOC Transition Note (Signed)
Transition of Care [facility]) - CM/SW Discharge Note   Patient Details  Name: Jeremiah [name] MRN: [id number] Date of Birth: 09-21-1946  Transition of Care [facility]) CM/SW Contact:  Leone Haven, RN Phone Number: 06/11/2023, 11:13 AM   Clinical Narrative:    Patient is for dc today, he is on room air in the room.  Per ambulatory sats does not need any oxygen.  His wile will transport him home at dc.      Barriers to Discharge: Continued Medical Work up   Patient Goals and CMS Choice      Discharge Placement                         Discharge Plan and Services Additional resources added to the After Visit Summary for     Discharge Planning Services: CM Consult                                 Social Determinants of Health (SDOH) Interventions SDOH Screenings   Food Insecurity: No Food Insecurity (06/05/2023)  Housing: Low Risk  (06/05/2023)  Transportation Needs: No Transportation Needs (06/05/2023)  Utilities: Not At Risk (06/05/2023)  Tobacco Use: Medium Risk (06/05/2023)     Readmission Risk Interventions     No data to display

## 2023-06-11 NOTE — Progress Notes (Signed)
Pt d/c instructions given,IV removed, Tele Removed CCMD made aware. Meds Delivered.

## 2023-06-11 NOTE — Discharge Summary (Signed)
Physician Discharge Summary  Jeremiah [name] VWU:[id number] DOB: December 26, 1945 DOA: 06/05/2023  PCP: [name], MD  Admit date: 06/05/2023 Discharge date: 06/11/2023  Time spent: 45 minutes  Recommendations for Outpatient Follow-up:  Capsule endoscopy at [facility] tomorrow 7/10 Structural heart team for moderate to severe mitral stenosis Cardiology [name] in 2 weeks Outpatient hematology referral sent for IV iron therapy   Discharge Diagnoses:  Principal Problem:   Acute on chronic diastolic CHF (congestive heart failure) (HCC) Iron deficiency anemia Suspect chronic GI blood loss Moderate to severe mitral stenosis   Essential hypertension   Type 2 diabetes mellitus with hyperglycemia, without long-term current use of insulin (HCC)   S/P CABG x 3   COPD GOLD II with reversibility   Hypothyroidism, acquired   Hyperlipidemia LDL goal <70   History of supraventricular tachycardia   Morbid obesity due to excess calories (HCC) complicated by hbp/hyperlipidemia    OSA (obstructive sleep apnea)   Prolonged QT interval   Coronary artery disease involving coronary bypass graft of native heart without angina pectoris   Iron deficiency anemia   Pulmonary hypertension, unspecified (HCC)   New onset atrial fibrillation (HCC)   Chronic diastolic (congestive) heart failure (HCC)   Acute on chronic respiratory failure with hypoxia [facility])   Discharge Condition: Improved  Diet recommendation: Low-sodium, diabetic  Filed Weights   06/09/23 0412 06/10/23 0605 06/11/23 0538  Weight: 87.5 kg 87.1 kg 87.6 kg    History of present illness:  77/M with history of type 2 diabetes mellitus, diastolic CHF, chronic respiratory failure on 2 to 3 L home O2, CKD 3A, chronic anemia, hypothyroidism, hypertension, COPD, CABG, pulmonary hypertension, OSA and obesity presented to the ED with worsening shortness of breath and anemia. -Ongoing issues with iron deficiency anemia, undergoing workup through  [facility], EGD and colonoscopy were unremarkable, due for capsule endoscopy -In the ER hemoglobin was 7.4, troponin 24, BNP 273, chest x-ray with cardiomegaly and suspected edema    Hospital Course:   Acute on chronic respiratory failure with hypoxia Acute on chronic diastolic CHF -Compounded by worsening anemia -Recent echo 2/24 with preserved EF, grade 2 diastolic dysfunction, mildly reduced RV, moderate to severe mitral stenosis -Diuresed with IV Lasix, transfused PRBC,volume status has improved, down 15 pounds -Converted to Lasix 80 Mg daily, also started on Aldactone -Weaned off O2 -Follow-up with cardiology in 2 weeks   Moderate to severe mitral stenosis -Seen by structural heart team, suspected to need complex intervention possibly at [facility] [place]   Iron deficiency anemia, heme positive stools -Ongoing gastroenterology workup, EGD and colonoscopy at [facility] in the last few months was unremarkable, due for on Wednesday 7/10 -Anemia panel confirmed iron deficiency -Suspect chronic intermittent low-grade GI bleeding, continue GI workup at [facility], send referral to outpatient hematology for frequent iron infusions -Taken off anticoagulation few months ago -Transfused 2 units of PRBC and given IV iron this admission, hemoglobin now stable in the 8.5-9 range   Paroxysmal atrial fibrillation -Off anticoagulation due to GI bleeding -Cards consulting, considering [facility] after GI bleeding issues have resolved   Gout flare -On account of diuretics and blood, continue colchicine, discontinue prednisone   COPD - Continue home Yupelri and Pulmicort   OSA - Continue home BiPAP nightly   Diabetes - SSI   CKD 2 -Stable, monitor with diuretics   Hypothyroidism - Continue Synthroid   Hyperlipidemia CAD/CABG - Continue statin, Zetia, aspirin   Hypertension - Diuresis as above    Obesity -  Noted   Consultations: General cardiology  Discharge  Exam: Vitals:   06/11/23 0739 06/11/23 0901  BP: 119/74   Pulse: 81 80  Resp: 18 18  Temp:    SpO2: 95%     Gen: Awake, Alert, Oriented X 3,  HEENT: no JVD Lungs: Good air movement bilaterally, CTAB CVS: S1S2/RRR Abd: soft, Non tender, non distended, BS present Extremities: Trace edema Skin: no new rashes on exposed skin   Discharge Instructions   Discharge Instructions     Ambulatory referral to Hematology / Oncology   Complete by: As directed    Diet - low sodium heart healthy   Complete by: As directed    Increase activity slowly   Complete by: As directed       Allergies as of 06/11/2023       Reactions   Shellfish Allergy Shortness Of Breath, Nausea And Vomiting, Rash   Scallops   Fluzone [influenza Virus Vaccine] Nausea And Vomiting, Palpitations, Rash        Medication List     STOP taking these medications    metoCLOPramide 5 MG tablet Commonly known as: REGLAN   OXYGEN       TAKE these medications    aspirin EC 81 MG tablet Take 1 tablet (81 mg total) by mouth daily. Swallow whole.   atorvastatin 80 MG tablet Commonly known as: LIPITOR Take 80 mg by mouth daily.   budesonide 0.5 MG/2ML nebulizer solution Commonly known as: Pulmicort Take 2 mLs (0.5 mg total) by nebulization 2 (two) times daily.   cetirizine 10 MG tablet Commonly known as: ZYRTEC Take 10 mg by mouth daily as needed for allergies.   colchicine 0.6 MG tablet Take 0.6 mg by mouth daily as needed (Gout).   Dexilant 60 MG capsule Generic drug: dexlansoprazole Take 60 mg by mouth daily.   dicyclomine 10 MG capsule Commonly known as: BENTYL Take 10 mg by mouth daily as needed for spasms.   ezetimibe 10 MG tablet Commonly known as: ZETIA TAKE 1 TABLET(10 MG) BY MOUTH DAILY   FeroSul 325 (65 FE) MG tablet Generic drug: ferrous sulfate TAKE 1 TABLET BY MOUTH DAILY WITH BREAKFAST What changed: how much to take   furosemide 40 MG tablet Commonly known as:  LASIX Take 2 tablets (80 mg total) by mouth daily. What changed:  how much to take when to take this additional instructions   glimepiride 4 MG tablet Commonly known as: AMARYL Take 4 mg by mouth daily.   levothyroxine 88 MCG tablet Commonly known as: SYNTHROID Take 88 mcg by mouth daily before breakfast.   potassium chloride SA 20 MEQ tablet Commonly known as: KLOR-CON M Take 1 tablet (20 mEq total) by mouth daily.   spironolactone 25 MG tablet Commonly known as: ALDACTONE Take 1 tablet (25 mg total) by mouth daily. Start taking on: June 12, 2023       Allergies  Allergen Reactions   Shellfish Allergy Shortness Of Breath, Nausea And Vomiting and Rash    Scallops     Fluzone [Influenza Virus Vaccine] Nausea And Vomiting, Palpitations and Rash    Follow-up Information     Koirala, Dibas, MD. Nyra Capes on 06/18/2023.   Specialty: Family Medicine Why: @12 :15pm Contact information: [address] [place] [address] [phone]                  The results of significant diagnostics from this hospitalization (including imaging, microbiology, ancillary and laboratory) are  listed below for reference.    Significant Diagnostic Studies: DG Chest Port 1 View  Result Date: 06/05/2023 CLINICAL DATA:  Shortness of breath. EXAM: PORTABLE CHEST 1 VIEW COMPARISON:  Chest radiograph 12/25/2018. FINDINGS: Mild interstitial prominence. No confluent consolidation. No visible pneumothorax. Possible small left pleural effusion. Cardiomegaly. Median sternotomy. Pulmonary arterial enlargement. IMPRESSION: 1. Cardiomegaly and mild interstitial prominence, which could represent the sequela of recurrent bouts of CHF or mild interstitial edema. No confluent consolidation. 2. Possible small left pleural effusion. Electronically Signed   By: Feliberto Harts M.D.   On: 06/05/2023 15:22    Microbiology: No results found for this or any previous visit (from the past 240  hour(s)).   Labs: Basic Metabolic Panel: Recent Labs  Lab 06/05/23 2035 06/06/23 0013 06/07/23 0038 06/08/23 0029 06/09/23 0054 06/10/23 0058 06/11/23 0052  NA  --    < > 138 138 134* 136 138  K  --    < > 3.5 4.1 4.6 3.6 4.3  CL  --    < > 97* 99 95* 96* 95*  CO2  --    < > 32 32 31 33* 30  GLUCOSE  --    < > 209* 193* 198* 193* 178*  BUN  --    < > 26* 19 26* 32* 33*  CREATININE  --    < > 0.96 0.94 1.03 1.30* 0.94  CALCIUM  --    < > 8.7* 8.8* 9.3 9.1 9.6  MG 2.1  --   --   --   --   --   --    < > = values in this interval not displayed.   Liver Function Tests: Recent Labs  Lab 06/05/23 1518 06/06/23 0013  AST 18 15  ALT 19 17  ALKPHOS 55 53  BILITOT 0.7 0.6  PROT 6.6 6.1*  ALBUMIN 3.0* 2.7*   No results for input(s): "LIPASE", "AMYLASE" in the last 168 hours. No results for input(s): "AMMONIA" in the last 168 hours. CBC: Recent Labs  Lab 06/05/23 1518 06/05/23 1549 06/09/23 1631 06/10/23 0058 06/10/23 1744 06/11/23 0052 06/11/23 0927  WBC 4.3   < > 4.7 4.0 4.9 4.5 4.6  NEUTROABS 2.8  --   --   --   --   --   --   HGB 7.4*   < > 9.2* 8.5* 8.9* 8.2* 9.0*  HCT 27.0*   < > 30.9* 29.2* 30.4* 28.4* 30.2*  MCV 93.1   < > 90.4 88.0 90.2 90.2 90.1  PLT 163   < > 253 245 294 249 273   < > = values in this interval not displayed.   Cardiac Enzymes: No results for input(s): "CKTOTAL", "CKMB", "CKMBINDEX", "TROPONINI" in the last 168 hours. BNP: BNP (last 3 results) Recent Labs    06/05/23 1530  BNP 273.8*    ProBNP (last 3 results) No results for input(s): "PROBNP" in the last 8760 hours.  CBG: Recent Labs  Lab 06/10/23 0621 06/10/23 1150 06/10/23 1602 06/10/23 2052 06/11/23 0542  GLUCAP 189* 176* 157* 171* 144*       Signed:  Zannie Cove MD.  [facility] 06/11/2023, 11:05 AM

## 2023-06-11 NOTE — Progress Notes (Signed)
Progress Note  Patient Name: Jeremiah [name] Date of Encounter: 06/11/2023  Primary Cardiologist: Chrystie Nose, MD   Subjective   Creatinine improved today to 0.94. Switched to oral lasix. Hemoglobin improved at 9.  Inpatient Medications    Scheduled Meds:  aspirin EC  81 mg Oral Daily   atorvastatin  80 mg Oral Daily   budesonide  0.5 mg Nebulization BID   ezetimibe  10 mg Oral Daily   feeding supplement  237 mL Oral BID BM   fluticasone  1 spray Each Nare Daily   furosemide  80 mg Oral Daily   insulin aspart  0-9 Units Subcutaneous TID WC   levothyroxine  88 mcg Oral Q0600   loratadine  10 mg Oral Daily   pantoprazole  40 mg Oral Daily   revefenacin  175 mcg Inhalation BH-q7a   sodium chloride flush  3 mL Intravenous Q12H   spironolactone  12.5 mg Oral Daily   triamcinolone cream   Topical TID   Continuous Infusions:   PRN Meds: acetaminophen **OR** acetaminophen, benzonatate, colchicine, dicyclomine, polyethylene glycol   Vital Signs    Vitals:   06/11/23 0044 06/11/23 0538 06/11/23 0739 06/11/23 0901  BP: (!) 92/50 132/60 119/74   Pulse: 96 96 81 80  Resp: 17 17 18 18   Temp: 97.8 F (36.6 C) 98 F (36.7 C)    TempSrc: Oral Oral    SpO2: 92% 95% 95%   Weight:  87.6 kg    Height:        Intake/Output Summary (Last 24 hours) at 06/11/2023 1104 Last data filed at 06/11/2023 0845 Gross per 24 hour  Intake 240 ml  Output 1175 ml  Net -935 ml   Filed Weights   06/09/23 0412 06/10/23 0605 06/11/23 0538  Weight: 87.5 kg 87.1 kg 87.6 kg    Telemetry    Sinus rhythm - Personally Reviewed  ECG    7/3 - sinus rhythm with PACs --  Personally Reviewed  Physical Exam   GEN: No acute distress.   Neck: No JVD Cardiac: RRR, no murmurs, rubs, or gallops.  Respiratory: Breathing easily, + rales. GI: Soft, nontender, non-distended  MS: 1+ edema; No deformity. Neuro:  Nonfocal  Psych: Normal affect   Labs    Chemistry Recent Labs  Lab 06/05/23 1518  06/05/23 1549 06/06/23 0013 06/07/23 0038 06/09/23 0054 06/10/23 0058 06/11/23 0052  NA 141   < > 138   < > 134* 136 138  K 3.9   < > 3.5   < > 4.6 3.6 4.3  CL 98   < > 97*   < > 95* 96* 95*  CO2 31  --  31   < > 31 33* 30  GLUCOSE 73   < > 154*   < > 198* 193* 178*  BUN 26*   < > 27*   < > 26* 32* 33*  CREATININE 0.86   < > 1.02   < > 1.03 1.30* 0.94  CALCIUM 8.9  --  8.3*   < > 9.3 9.1 9.6  PROT 6.6  --  6.1*  --   --   --   --   ALBUMIN 3.0*  --  2.7*  --   --   --   --   AST 18  --  15  --   --   --   --   ALT 19  --  17  --   --   --   --  ALKPHOS 55  --  53  --   --   --   --   BILITOT 0.7  --  0.6  --   --   --   --   GFRNONAA >60  --  >60   < > >60 57* >60  ANIONGAP 12  --  10   < > 8 7 13    < > = values in this interval not displayed.     Hematology Recent Labs  Lab 06/10/23 1744 06/11/23 0052 06/11/23 0927  WBC 4.9 4.5 4.6  RBC 3.37* 3.15* 3.35*  HGB 8.9* 8.2* 9.0*  HCT 30.4* 28.4* 30.2*  MCV 90.2 90.2 90.1  MCH 26.4 26.0 26.9  MCHC 29.3* 28.9* 29.8*  RDW 17.2* 17.3* 17.7*  PLT 294 249 273    Cardiac EnzymesNo results for input(s): "TROPONINI" in the last 168 hours. No results for input(s): "TROPIPOC" in the last 168 hours.   BNP Recent Labs  Lab 06/05/23 1530  BNP 273.8*     DDimer No results for input(s): "DDIMER" in the last 168 hours.   Summary of Pertinent studies    TTE: pending outpatient in 2 weeks  Patient Profile     77 y.o. male with a hx of PAF, mitral stenosis, CAD, s/p remote CABG, and AVWB who is being seen for the evaluation of worsening heart failure in the setting of anemia due to GI bleeding.   Assessment & Plan    Acute diastolic heart failure Net negative overnight 375 ml- creatinine normal after holding IV lasix. Starting oral lasix 80 mg daily today. Needs Rx at discharge.  Moderate to severe mitral stenosis Contributing to diastolic dysfunction Already seen by structural heart team - suspect he may need complex  intervention possibly at [facility]; no intervention indicated -- I think much of this is blocked PACs, Multiple PACs noted, probably wandering atrial pacemaker. Telemetry shows mostly sinus rhythm.  Paroxysmal atrial fibrillation Anticoagulation held due to GI bleed  GI bleed, anemia Received transfusions - Hemoglobin stable. Per pt, GI at [facility] - plan for capsule endoscopy tomorrow.  Ok for d/c home today. Has outpatient echo scheduled on 06/25/2023 and follow-up with me the next day.   For questions or updates, please contact [facility] Please consult [email or web address] for contact info under Cardiology/STEMI.   Chrystie Nose, MD, [facility], FACP  [place]  [facility]  Medical Director of the [facility]  Cardiovascular Risk Reduction Clinic Diplomate of the American Board of Clinical Lipidology Attending Cardiologist  Direct Dial: [phone]  Fax: [phone]  Website:  [email or web address]  Chrystie Nose, MD 06/11/2023, 11:04 AM

## 2023-06-12 ENCOUNTER — Institutional Professional Consult (permissible substitution): Payer: Federal, State, Local not specified - PPO | Admitting: Cardiology

## 2023-06-12 DIAGNOSIS — Z8719 Personal history of other diseases of the digestive system: Secondary | ICD-10-CM | POA: Diagnosis not present

## 2023-06-12 DIAGNOSIS — D500 Iron deficiency anemia secondary to blood loss (chronic): Secondary | ICD-10-CM | POA: Diagnosis not present

## 2023-06-17 DIAGNOSIS — D649 Anemia, unspecified: Secondary | ICD-10-CM | POA: Diagnosis not present

## 2023-06-18 DIAGNOSIS — I5032 Chronic diastolic (congestive) heart failure: Secondary | ICD-10-CM | POA: Diagnosis not present

## 2023-06-18 DIAGNOSIS — E611 Iron deficiency: Secondary | ICD-10-CM | POA: Diagnosis not present

## 2023-06-18 DIAGNOSIS — I050 Rheumatic mitral stenosis: Secondary | ICD-10-CM | POA: Diagnosis not present

## 2023-06-20 ENCOUNTER — Encounter: Payer: Self-pay | Admitting: Internal Medicine

## 2023-06-20 ENCOUNTER — Ambulatory Visit: Payer: Federal, State, Local not specified - PPO | Admitting: Internal Medicine

## 2023-06-20 VITALS — BP 96/56 | HR 96 | Temp 97.7°F | Ht 68.0 in | Wt 193.0 lb

## 2023-06-20 DIAGNOSIS — K6389 Other specified diseases of intestine: Secondary | ICD-10-CM | POA: Diagnosis not present

## 2023-06-20 DIAGNOSIS — K922 Gastrointestinal hemorrhage, unspecified: Secondary | ICD-10-CM | POA: Diagnosis not present

## 2023-06-20 DIAGNOSIS — J9611 Chronic respiratory failure with hypoxia: Secondary | ICD-10-CM

## 2023-06-20 DIAGNOSIS — J9612 Chronic respiratory failure with hypercapnia: Secondary | ICD-10-CM

## 2023-06-20 DIAGNOSIS — J449 Chronic obstructive pulmonary disease, unspecified: Secondary | ICD-10-CM | POA: Diagnosis not present

## 2023-06-20 DIAGNOSIS — D500 Iron deficiency anemia secondary to blood loss (chronic): Secondary | ICD-10-CM | POA: Diagnosis not present

## 2023-06-20 NOTE — Patient Instructions (Addendum)
Make sure you check your oxygen saturation  AT  your highest level of activity (not after you stop)   to be sure it stays over 90% and adjust  02 flow upward to maintain this level if needed but remember to turn it back to previous settings when you stop (to conserve your supply).  ° ° °Please schedule a follow up visit in 12 months but call sooner if needed  °

## 2023-06-20 NOTE — Assessment & Plan Note (Signed)
Quit smoking 12/2016 Spirometry 02/12/2017  FEV1 0.79 (20%)  Ratio 45 but f/v loop is junk - trial of selective BB 02/12/2017  - PFT's  04/01/2017  FEV1 1.29 ( 44% ) ratio 55  p no % improvement from saba p ?  prior to study with DLCO  65/67 % corrects to 85  % for alv volume  - 05/02/2017  After extensive coaching HFA effectiveness =    75% > try bevespi samples x 2 weeks  > no objective change so did not continue  - Referred to rehab 08/26/2017 > improved - PFT's  10/28/2018  FEV1 1.25 (43 % ) ratio 59  p 7 % improvement from saba p nothing prior to study with DLCO  67 % corrects to 88 % for alv volume  With mild curvature/ insp truncation s plateau  - alpha one screen 10/28/2018   MM  Level 144  - 01/01/2019 changed to yupelri/ performist  PFT's  02/05/2019  FEV1 1.53 (53 % ) ratio 0.76  p 20 % improvement from saba p performist/yupelri prior to study with DLCO  61/80c % corrects to 120  % for alv volume   -note follow-up flow volume loop suggests a non-fixed and inspiratory truncation c/w the effects of morbid obesity on the upper airway  in patients with a history of sleep apnea though no classic sawtooth pattern is present.  - added yupelri back 07/20/2021 >>> reported better ex tol 08/21/2022    Group D (now reclassified as E) in terms of symptom/risk and laba/lama/ICS  therefore appropriate rx at this point >>>  triple rx per NEB   F/u yearly

## 2023-06-20 NOTE — Progress Notes (Signed)
Subjective:     Patient ID: Jeremiah [name], male   DOB: [birth year]    MRN: [id number]     Brief patient profile:  29 yowm MM/quit smoking 12/15/16 with onset of doe x 2008 and GOLD III criteria confirmed 10/28/2018 with restictive component due to obesity / prev eval for osa but could not tol cpap.  After treatment with a Lama/lama he improved to GOLD II criteria as of 02/05/2019     History of Present Illness   02/12/2017 1st [facility] office visit/ Jeremiah [name]  Re ? Copd  Chief Complaint  Patient presents with   Pulmonary Consult    Referred by Dr. Docia Chuck. Pt c/o SOB for the past 10 yrs. He gets SOB when running and working "which I don't do anymore".   MMRC2 = can't walk a nl pace on a flat grade s sob but does fine slow and flat eg shopping / limited also by R chronic ankle pain p fx rec Stop lopressor (metaprolol) and start bisoprolol 5 mg twice daily  If face/eye symptoms  worse or fever go back to ER  Please schedule a follow up office visit in 4 weeks, sooner if needed with pfts on return     Date of Admission: 12/25/2018                      Date of Discharge: 12/26/2018   Discharge Diagnoses/Problem List:  CHF exacerbation Symptomatic anemia COPD CAD DM-II HTN HLD Hypothyroidism OSA    Issues for Follow Up:  CHF - patient on bisoprolol and lasix at home.  Consider increasing lasix dose and adding ARB Anemia - patient anemic with low ferritin.  Get CBC Colonoscopy - patient overdue for colonoscopy.  Important in setting of anemia.   COPD - on duonebs at home PRN. Wheezing present on exam. Consider adding a controller medication.  Diabetes - consider discontinuing glimepiride given      01/01/2019  Extended post hosp f/u  ov/Jeremiah [name] re: copd/ chf/ anemia - not on maint rx, just duoneb prn  Chief Complaint  Patient presents with   Hospitalization Follow-up    Breathing has improved. He is now on 4lpm o2 24/7. CT Angiogram done today.    Dyspnea:  MMRC4  = sob if  tries to leave home or while getting dressed  Even on 02 but ok at rest sitting  Cough: min dry  Sleeping: 45 degrees  SABA use: duoneb helps the most 02: 4lpm 24 /7  rec Plan A = Automatic = performist 20 mcg twice daily in neb with yupelri 175 mcg each am  Plan B = Backup Only use your albuterol nebulizer as a rescue medication  Goal for 02 is to keep sats above 90% at all times so may be able to adjust back down to 2lpm at rest to save on protable tank  and turn up with walking to whatever setting needed to reach this goal. Please schedule a follow up office visit in 4 weeks, sooner if needed  with all medications /inhalers/ solutions in hand so we can verify exactly what you are taking. This includes all medications from all doctors and over the counters  - PFTs on return     02/05/2019  f/u ov/Jeremiah [name] re: GOLD II with reversible component  Chief Complaint  Patient presents with   Follow-up    PFT's done today. Breathing is doing well. He has not had to use his albuterol neb.  Dyspnea:  MMRC3 = can't walk 100 yards even at a slow pace at a flat grade s stopping due to sob   Cough: none Sleeping: sleeping 30 degrees  SABA use: no saba  02: 4lpm walking / 3 rest/ 3 sleeping  Rec No change nebulizers Keep working on weight loss > be sure you check your 02 saturations at rest/ activity with goal of keeping above 90% at all times Please schedule a follow up visit in 3 months but call sooner if needed     07/20/2021  f/u ov/Jeremiah [name] re: copd GOLD 2/ MO with hypercabia/hypoxemia  maint on performist only in am  Chief Complaint  Patient presents with   Follow-up    Overdue follow-up. Doing well   Dyspnea:  shops at [employer]/ ht s 02 x sev aisles  = MMRC3 = can't walk 100 yards even at a slow pace at a flat grade s stopping due to sob   Cough: none  Sleeping:  bed is flat with 2 pillows SABA use: not using  02: 3 lpm hs and not typically checking/ titrating with exertion Covid status:   vax  x 3  Rec Add yupelri 175 mcg daily with your perforomist  and Add another performist 12 hours later if you are planning activity for the evening Make sure you check your oxygen saturation  at your highest level of activity  to be sure it stays over 90%    08/21/2022  f/u ov/Jeremiah [name] re: copd gold 2/ MO maint on performist/yupelri    Chief Complaint  Patient presents with   Follow-up    Pt states he had a hernia operation and colon operation as well. Pt is still using 3L's of oxygen at night.    Dyspnea:  very active remodeling house / doing rowing and bicycle at [place] s 02 or monitor Cough: none  Sleeping: recliner almost flat  SABA use: none  02: 3lpm hs and prn seldom daytime  Rec Make sure you check your oxygen saturation  AT  your highest level of activity (not after you stop)    05/13/23 We will treat you for exacerbation of your COPD Start prolonged prednisone taper: 40mg  daily x 3 days 30mg  daily x3 days 20mg  daily x 3 days 10mg  daily x 3 days Start Zpak antibiotic for 5 days Start budesonide 0.5mg  nebulizer treatment twice dialy Continue performist twice dialy Continue yupelri daily     06/20/2023  f/u ov/Jeremiah [name] re: GOLD 2 / 02 at hs    maint on perforomist/yupelri/budesonide    Chief Complaint  Patient presents with   Follow-up    Follow up from hospital visit.  Low SaO2 with anemia episodes.   Dyspnea:  25% of baseline  Cough: none  Sleeping: back in bed  flat with 2pillows  SABA use: none  02: 2lpm hs/ none daytime      No obvious day to day or daytime variability or assoc excess/ purulent sputum or mucus plugs or hemoptysis or cp or chest tightness, subjective wheeze or overt sinus or hb symptoms.   Sleeping  without nocturnal  or early am exacerbation  of respiratory  c/o's or need for noct saba. Also denies any obvious fluctuation of symptoms with weather or environmental changes or other aggravating or alleviating factors except as outlined above   No unusual  exposure hx or h/o childhood pna/ asthma or knowledge of premature birth.  Current Allergies, Complete Past Medical History, Past Surgical History, Family History, and Social  History were reviewed in [facility] electronic medical record.  ROS  The following are not active complaints unless bolded Hoarseness, sore throat, dysphagia, dental problems, itching, sneezing,  nasal congestion or discharge of excess mucus or purulent secretions, ear ache,   fever, chills, sweats, unintended wt loss or wt gain, classically pleuritic or exertional cp,  orthopnea pnd or arm/hand swelling  or leg swelling, presyncope, palpitations, abdominal pain, anorexia, nausea, vomiting, diarrhea  or change in bowel habits or change in bladder habits, change in stools or change in urine, dysuria, hematuria,  rash, arthralgias, visual complaints, headache, numbness, weakness or ataxia or problems with walking or coordination,  change in mood or  memory.        Current Meds  Medication Sig   aspirin EC 81 MG tablet Take 1 tablet (81 mg total) by mouth daily. Swallow whole.   atorvastatin (LIPITOR) 80 MG tablet Take 80 mg by mouth daily.   budesonide (PULMICORT) 0.5 MG/2ML nebulizer solution Take 2 mLs (0.5 mg total) by nebulization 2 (two) times daily.   cetirizine (ZYRTEC) 10 MG tablet Take 10 mg by mouth daily as needed for allergies.   colchicine 0.6 MG tablet Take 0.6 mg by mouth daily as needed (Gout).   dexlansoprazole (DEXILANT) 60 MG capsule Take 60 mg by mouth daily.   dicyclomine (BENTYL) 10 MG capsule Take 10 mg by mouth daily as needed for spasms.   ezetimibe (ZETIA) 10 MG tablet TAKE 1 TABLET(10 MG) BY MOUTH DAILY   ferrous sulfate (FEROSUL) 325 (65 FE) MG tablet TAKE 1 TABLET BY MOUTH DAILY WITH BREAKFAST (Patient taking differently: Take 325 mg by mouth daily with breakfast.)   furosemide (LASIX) 40 MG tablet Take 2 tablets (80 mg total) by mouth daily.   glimepiride (AMARYL) 4 MG tablet Take 4 mg by  mouth daily.   levothyroxine (SYNTHROID, LEVOTHROID) 88 MCG tablet Take 88 mcg by mouth daily before breakfast.   potassium chloride SA (KLOR-CON M) 20 MEQ tablet Take 1 tablet (20 mEq total) by mouth daily.   spironolactone (ALDACTONE) 25 MG tablet Take 1 tablet (25 mg total) by mouth daily.                       Objective:   Physical Exam  Wts  06/20/2023       193  08/21/2022       228  07/20/2021       238  02/05/2019         251  01/01/2019       255  10/28/2018     252  08/26/2017       246  05/02/2017       248  04/01/2017      244   02/12/17 248 lb (112.5 kg)  02/11/17 239 lb (108.4 kg)  01/02/17 245 lb (111.1 kg)    Vital signs reviewed  06/20/2023  - Note at rest 02 sats  98% on RA   General appearance:    amb pleasant wm nad     HEENT : Oropharynx  clear   Nasal turbinates nl    NECK :  without  apparent JVD/ palpable Nodes/TM    LUNGS: no acc muscle use,  Min barrel  contour chest wall with bilateral  slightly decreased bs s audible wheeze and  without cough on insp or exp maneuvers and min  Hyperresonant  to  percussion bilaterally    CV:  RRR  no s3 or murmur or increase in P2, and no edema   ABD:  soft and nontender with reducible IH  MS:  Nl gait/ ext warm without deformities Or obvious joint restrictions  calf tenderness, cyanosis or clubbing     SKIN: warm and dry with mild venous statis changes both LE's  NEURO:  alert, approp, nl sensorium with  no motor or cerebellar deficits apparent.              I personally reviewed images and agree with radiology impression as follows:   Chest LDSCT     12/18/22 1. Lung-RADS 1, negative. Continue annual screening with low-dose chest CT without contrast in 12 months. 2.  Aortic atherosclerosis (ICD10-I70.0). 3. Enlarged pulmonic trunk, indicative of pulmonary arterial hypertension. 4.  Emphysema (ICD10-J43.9).               Assessment:

## 2023-06-20 NOTE — Assessment & Plan Note (Signed)
HCO3  03/02/17 = 37  -  04/01/2017 : Patient Saturations on Room Air at Rest = 96%  And  while Ambulating = 88% Patient Saturations on 2 Liters of oxygen while Ambulating = 94% - HCO3  12/26/18  = 34  - portable 02 eval  10/28.22 neeeded 4lpm Pulsed to maintain sats walking so rec 2 refillable ML6 tanks/ 3lpm pulsed sitting  - HC03   05/27/22  = 26    As of 06/20/2023  02 2lpm hs and prn          Each maintenance medication was reviewed in detail including emphasizing most importantly the difference between maintenance and prns and under what circumstances the prns are to be triggered using an action plan format where appropriate.  Total time for H and P, chart review, counseling, reviewing neb/02  device(s) and generating customized AVS unique to this office visit / same day charting = 20 min

## 2023-06-25 ENCOUNTER — Ambulatory Visit: Payer: Federal, State, Local not specified - PPO

## 2023-06-25 ENCOUNTER — Ambulatory Visit: Payer: Federal, State, Local not specified - PPO | Attending: Internal Medicine

## 2023-06-25 DIAGNOSIS — I480 Paroxysmal atrial fibrillation: Secondary | ICD-10-CM | POA: Insufficient documentation

## 2023-06-25 DIAGNOSIS — I342 Nonrheumatic mitral (valve) stenosis: Secondary | ICD-10-CM

## 2023-06-25 DIAGNOSIS — I4891 Unspecified atrial fibrillation: Secondary | ICD-10-CM | POA: Diagnosis not present

## 2023-06-25 DIAGNOSIS — Z951 Presence of aortocoronary bypass graft: Secondary | ICD-10-CM | POA: Diagnosis not present

## 2023-06-25 DIAGNOSIS — I350 Nonrheumatic aortic (valve) stenosis: Secondary | ICD-10-CM | POA: Insufficient documentation

## 2023-06-25 DIAGNOSIS — I5032 Chronic diastolic (congestive) heart failure: Secondary | ICD-10-CM | POA: Diagnosis not present

## 2023-06-25 LAB — ECHOCARDIOGRAM COMPLETE
Area-P 1/2: 3.11 cm2
MV VTI: 1.77 cm2
S' Lateral: 4.1 cm

## 2023-06-26 ENCOUNTER — Ambulatory Visit: Payer: Federal, State, Local not specified - PPO | Admitting: Internal Medicine

## 2023-06-26 ENCOUNTER — Encounter: Payer: Self-pay | Admitting: Internal Medicine

## 2023-06-26 ENCOUNTER — Other Ambulatory Visit: Payer: Self-pay | Admitting: Physician Assistant

## 2023-06-26 VITALS — BP 118/58 | HR 58 | Ht 68.0 in | Wt 191.4 lb

## 2023-06-26 DIAGNOSIS — I5032 Chronic diastolic (congestive) heart failure: Secondary | ICD-10-CM | POA: Diagnosis not present

## 2023-06-26 DIAGNOSIS — Z951 Presence of aortocoronary bypass graft: Secondary | ICD-10-CM

## 2023-06-26 DIAGNOSIS — I350 Nonrheumatic aortic (valve) stenosis: Secondary | ICD-10-CM | POA: Diagnosis not present

## 2023-06-26 DIAGNOSIS — D500 Iron deficiency anemia secondary to blood loss (chronic): Secondary | ICD-10-CM

## 2023-06-26 DIAGNOSIS — I480 Paroxysmal atrial fibrillation: Secondary | ICD-10-CM | POA: Diagnosis not present

## 2023-06-26 DIAGNOSIS — I342 Nonrheumatic mitral (valve) stenosis: Secondary | ICD-10-CM

## 2023-06-26 LAB — CBC
Hematocrit: 27.9 % — ABNORMAL LOW (ref 37.5–51.0)
Hemoglobin: 8.8 g/dL — ABNORMAL LOW (ref 13.0–17.7)
MCH: 29.6 pg (ref 26.6–33.0)
MCHC: 31.5 g/dL (ref 31.5–35.7)
MCV: 94 fL (ref 79–97)
Platelets: 242 10*3/uL (ref 150–450)
RBC: 2.97 x10E6/uL — ABNORMAL LOW (ref 4.14–5.80)
RDW: 19.4 % — ABNORMAL HIGH (ref 11.6–15.4)
WBC: 5.3 10*3/uL (ref 3.4–10.8)

## 2023-06-26 MED ORDER — FLUTICASONE PROPIONATE 50 MCG/ACT NA SUSP: 1.0000 | Freq: Every day | NASAL | 2 refills | Status: DC

## 2023-06-26 NOTE — Patient Instructions (Signed)
Medication Instructions:   Your physician recommends that you continue on your current medications as directed. Please refer to the Current Medication list given to you today.  Take flonase one (1) spray each nostril daily.   *If you need a refill on your cardiac medications before your next appointment, please call your pharmacy*   Lab Work:  None ordered.  If you have labs (blood work) drawn today and your tests are completely normal, you will receive your results only by: MyChart Message (if you have MyChart) OR A paper copy in the mail If you have any lab test that is abnormal or we need to change your treatment, we will call you to review the results.   Testing/Procedures:  None ordered.   Follow-Up: At [facility], you and your health needs are our priority.  As part of our continuing mission to provide you with exceptional heart care, we have created designated Provider Care Teams.  These Care Teams include your primary Cardiologist (physician) and Advanced Practice Providers (APPs -  Physician Assistants and Nurse Practitioners) who all work together to provide you with the care you need, when you need it.  We recommend signing up for the patient portal called "MyChart".  Sign up information is provided on this After Visit Summary.  MyChart is used to connect with patients for Virtual Visits (Telemedicine).  Patients are able to view lab/test results, encounter notes, upcoming appointments, etc.  Non-urgent messages can be sent to your provider as well.   To learn more about what you can do with MyChart, go to [email or web address].    Your next appointment:   5 month(s)  Provider:   Chrystie Nose, MD

## 2023-06-26 NOTE — Progress Notes (Signed)
OFFICE NOTE  Chief Complaint:  Follow-up hospitalization  Primary Care Physician: Jeremiah Bussing, MD  HPI:  Jeremiah [name] is a 77 y.o. 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 He is originally from [place] and his wife who is accompanying him today is originally from [place]. [name] had an MI in 2006 and underwent coronary artery bypass grafting with a LIMA to LAD, SVG to OM1, and SVG to RPDA. He did fairly well with this however 2015 was having chest discomfort. He underwent a nuclear stress test which showed possible reversible ischemia. Therefore he was referred for cardiac catheterization at the [facility] [place]. That demonstrated all 3 patent bypass grafts with multivessel coronary disease. At that time he was also having episodes of palpitations and a monitor demonstrated PSVT. He was scheduled for possible ablation but is had chronic problems with an ankle fracture and nonhealing wound. He therefore never underwent ablation. He was placed on digoxin and has been on metoprolol but stopped the digoxin at some point in the past. He reports over the past 8-12 months she's had no further palpitations. He also has a history of dyslipidemia, hypothyroidism, and borderline diabetes. Recently he had a sleep study through [facility] physicians which demonstrated severe obstructive sleep apnea and BiPAP was recommended with settings of 19/14 cm water pressure. Currently he denies any chest pain or worsening shortness of breath. His last echocardiogram from his cardiologist in [place] was a 2015 which showed an EF of 58% and no significant valvular disease.  07/26/2016  Jeremiah [name] returns today for follow-up. He was recently seen in the emergency department in the beginning of June for chest pain. This was associated with tachycardia and probable SVT. Troponin was noted to be elevated to 0.12. He had signs and symptoms concerning for unstable angina. He was  evaluated by my partners and felt to need cardiac catheterization. Eventually he underwent cardiac catheterization by Dr. Katrinka [name] with the results as follows:  Conclusion   LM lesion, 85% stenosed. Ost 1st Mrg lesion, 100% stenosed. SVG was injected . Ost RCA to Prox RCA lesion, 100% stenosed. SVG . Origin to Prox Graft lesion, 40% stenosed. Mid RCA to Dist RCA lesion, 100% stenosed. Prox LAD lesion, 100% stenosed. LIMA .   Patent saphenous vein grafts. SVG to PDA contains eccentric 40% proximal stenosis. SVG to the circumflex is widely patent. LIMA to the LAD is widely patent. Native distal left main contains 75% stenosis. The dominant obtuse marginal branch is totally occluded. The native right coronary is totally occluded. Left ventriculogram demonstrates inferior wall hypokinesis. EF 50%. Recent chest pain in the setting of PSVT with minimal enzyme elevation likely related to demand ischemia in the setting of underlying native coronary disease.   Recommendations: Medical therapy. Management of PSVT with medications versus ablation.   Since discharge she denies any further episodes of tachycardia palpitations. He is already on a high dose of beta blocker. He's had a small amount of weight gain but has stopped smoking.  He plans to start to do more exercise and work on weight loss. I'm concerned about his recurrent palpitations and the fact that asymptomatic and has demand ischemia related to them and bypass graft insufficiency.  06/10/2017  Jeremiah [name] returns today for follow-up. It's been almost a year since I last saw him. He was found to have recurrent symptomatic SVT and underwent comprehensive EP study and was diagnosed with classic AV nodal reentry tachycardia  and subsequently underwent selective radiofrequency ablation by Dr. Elberta [name]. He says since that time he's had no further episodes of tachypalpitations. Since then he was hospitalized twice, after developing C. difficile colitis  and enteritis with GI bleed in January. Subsequently he developed acute congestive heart failure was found to have a mild reduction in LVEF to 45-50% by echo in March 2018. At that time he was on furosemide 40 mg daily. He had a follow-up with Jeremiah Course, PA-C in April who noted he was on Lasix 40 mg twice daily, however he reported that he was not taking that to me today. Fact he says he is now off of Lasix. He reports worsening shortness of breath and hasn't fact had a 13 pound weight gain since April. He denies any lower extremity edema. He has been working with Dr. Sherene [name] for his shortness of breath.  07/22/2017  Jeremiah [name] is back today for follow-up. He restarted his Lasix at my direction his weight is now down from 252-244. He's had significant improvement in swelling and reports some improvement in his breathing however he says it still not good. He does have a mildly reduced EF of 45-50% but also has some chronic lung disease which is likely contributing. He says he gets short of breath while doing certain activities in the yard and then comes inside and uses his oxygen which improves him almost immediately. He denies any orthopnea. He has not had any recent or productive cough. He has a follow-up visit with his pulmonologist tomorrow. I had ordered lab work including a metabolic profile and BNP which was never obtained.  09/09/2018  Jeremiah [name] is seen today in follow-up.  He reports some improvement in his shortness of breath.  He has joined a gym and now is exercising every day.  Weight however has been stable at 252, actually up 2 pounds from April.  Despite this his breathing has improved somewhat.  He still uses oxygen.  He did have one episode recently where he became nauseated and vomited.  He was up at the [facility] [place] and had been taking antibiotics for a dental infection.  He did undergo CT scan of the head and neck and that demonstrated bilateral carotid artery calcification.  This is not  surprising given his history of coronary artery disease and prior CABG however he has not had prior carotid Dopplers.    02/18/2019  Jeremiah [name] is seen today in follow-up.  Recently he says has had some improvement in shortness of breath.  Dr. Sherene [name], his pulmonologist had started him on a new nebulizer called revefenacin.  His most recent hemoglobin A1c was 7.1.  Weight is down about 4 pounds.  He denies any new chest pain.  He has had some recent anemia and work-up is apparently underway for this.  He is tentatively scheduled for an endoscopy on April 16 with Dr. Marca Ancona.  He is concerned because of his pulmonary status that he may be at high risk of decompensation with the procedure.  He also has follow-up with his PCP in the near future.  09/14/2019  Jeremiah [name] returns today for follow-up.  He continues to do well.  His shortness of breath has improved which I think was both believed due to a combination of pulmonary optimization as well as improvement in LV function.  Recently his echo in fact showed LVEF has normalized to 60 to 65%.  He remains euvolemic and is actually 2 pounds lighter than he was seen about  8 months ago.  Hemoglobin A1c has been reasonably well-controlled however his lipids are still uncontrolled.  His total cholesterol is 211, triglycerides 494, HDL 32 and LDL of 114.  His target LDL is less than 70 and his elevated triglycerides also put him at increased cardiovascular risk.  03/02/2020  Jeremiah [name] is seen today in follow-up.  He denies any chest pain but does have some shortness of breath.  Is been some weight gain.  He reports less physical activity has been struggling with a long recovery after undergoing circumcision for issues with phimosis.  Its unfortunate that he had a difficult recovery but seems to be doing better.  Blood pressure was well controlled today.  His EKG demonstrated some worsening inferior and lateral T wave inversions.  This was previously seen however not as  significant.  His last heart cath was in 2017 which did show a patent SVG to OM and occluded SVG to right coronary.  He is anticipating restarting an exercise program and working with a Psychologist, educational.  02/24/2021  Jeremiah [name] is seen today in follow-up.  Overall he says he is feeling reasonably well.  He has had some shortness of breath.  He is scheduled for a sleep study.  We discussed this because of the previously mentioned issues and that is actually scheduled tonight.  He also has a history of heart failure with diabetes which is not at target A1c of 8.3.  Lipids recently at assess her total cholesterol 106, HDL 33, LDL 49 and triglycerides 134.  07/18/2021  Jeremiah [name] is seen today for routine follow-up.  He seems to be doing well on the Jardiance at 25 mg daily.  Hemoglobin A1c is now come down to 7.3.  Blood pressure is well controlled.  EKG is unchanged showing sinus rhythm with some lateral T wave changes.  He has noted some lower blood pressures at home.  He says around the mid 90s over 63s.  Blood pressure here was elevated 130/90 however more typical readings are between 90 and 100 systolic.  He says he is asymptomatic for this.  He is on very little medicine.  1 option would be to consider backing off on the bisoprolol.  Cholesterol is very well controlled as of February total 106, HDL 33, LDL 49 triglycerides 134.  I do not believe it is "too low".  Would recommend continue his current regimen.  02/02/2022  Jeremiah [name] is seen today in follow-up.  Overall he says he is doing very well.  Weight has been fairly stable.  Blood pressure is well controlled.  He has been undergoing some rehab for a frozen shoulder.  He denies any worsening shortness of breath.  Recent labs showed total cholesterol 153, HDL 33 triglycerides 291 and LDL 73.  A1c 7.3%.  EKG shows a sinus rhythm with some inferolateral T wave changes (unchanged from prior EKG in 2022).  09/07/2022  Jeremiah [name] is seen today in follow-up.  Overall  he seems to be doing pretty well.  He is actually lost quite a bit of weight recently.  He is down to 215 pounds but was as high as 245 back in March of this past year.  He has made some dietary changes.  He is also had some issues with his GI system and is having an upcoming endoscopy.  Blood pressure is well controlled.  He denies any chest pain.  He did have a recent syncopal episode.  He said he was working on his house  and had been not sleeping well and thought he was dehydrated and simply passed out.  It does not sound like there was a clear prodrome prior to that but if he feels it was likely related to exhaustion.  He has never passed out before.  He has not had any subsequent episodes.  12/27/2022  Jeremiah [name] returns today for follow-up.  Recently he is followed up with Dr. Sherene [name].  Has been having issues with reflux and had EGD and colonoscopy.  He was noted to have I believe 24 lesions of the esophagus, likely Barrett's esophagus.  He has had vomiting and is scheduled for a gastric emptying study.  Despite this, he is also made significant dietary changes and reports in the past 3 months he is lost about 35 pounds.  It was noted that he is markedly hypotensive today with blood pressure 82/46.  He was also seen recently as mentioned with Dr. Sherene [name] and had a CT scan of the chest.  Besides emphysema this demonstrated aortic valve calcification as well as pulmonary artery dilatation suggestive of pulmonary hypertension.  He has been short of breath but uses home oxygen.  He also tells me that he has an upcoming visit for periodontal surgery with Dr. Jeanice Lim.  02/18/2023  Jeremiah [name] is seen today as an add-on visit.  Unfortunately he was just hospitalized over the past week with dehydration, nausea vomiting and diarrhea as well as abdominal pain.  He was seen in [facility] ER and stabilized and then presented to his PCP was sent to [facility].  There he was admitted and found to have norovirus gastroenteritis in addition to what  was described as an ischemic colitis.  His medications were held including his diuretics and blood pressure medicines to allow higher blood pressure.  I had recently cut back on his blood pressure medicine in January, stopping his losartan.  Since discharge she has had some persistent diarrhea.  He was noted to be somewhat anemic with a hemoglobin of 11.4.  His echo was read by myself and surprisingly indicated that he has moderate to severe mitral stenosis with elevated left atrial pressure.  This is likely contributing to some of his symptoms of shortness of breath.  Today he says he feels quite fatigued and is very symptomatic.  He is lost a significant amount of weight down another 15 pounds since I saw him in January.  In addition to the mitral stenosis, his EKG today shows atrial fibrillation.  This is a new diagnosis for him.  It does not appear that he had an EKG at [facility] that I could find but did have an EKG at [facility] which showed sinus rhythm with marked sinus arrhythmia although I would say it is questionably A-fib.  03/28/2023  Jeremiah [name] returns today for follow-up.  Unfortunately he has been hospitalized at least twice since I last saw him at [facility].  He has had issues with recurrent diarrhea and then unfortunately GI bleeding.  He was found to have mesenteric ischemia and underwent left brachial artery cutdown and mesenteric artery and celiac artery stenting.  Fortunately, he was noted to be in sinus rhythm when he presented although we had been working him up for possible TEE cardioversion.  Ultimately cardiology was consulted and they recommended a TEE to further evaluate his mitral valve disease.  Eliquis was held but after stenting ultimately aspirin and Eliquis were restarted.  He is TEE demonstrated normal LVEF with heavily calcified mitral valve that was thought to be degenerative  with mild MR and a mean gradient of 5.8 at 65 bpm consistent with severe mitral stenosis with possibly low gradient.   There was also diffuse calcification of the aortic valve which planimeter to 1.2 cm with a peak velocity of 3.2 m/s thought to be moderately stenotic.  He required 8 units of packed red blood cells and after discharge remained anemic with a hemoglobin of 7.8.  He remains on oxygen and was recently started on iron.  We discussed today about possible management options.  I do feel that mitral stenosis is likely a major risk factor for worsening shortness of breath as well as development of recurrent atrial fibrillation and stroke.  They had discussed the possibility of a Watchman device with him however with his peripheral arterial disease and mitral stenosis I think this would be a suboptimal procedure.  Ideally 1 would consider mitral valve replacement surgery with left atrial appendage closure surgically, and possibly consider aortic valve intervention.  However his comorbidities, recent GI bleeding and mesenteric ischemia may make him a prohibitive surgical candidate.  Additionally he has had prior CABG and this would be a redo sternotomy.  06/26/2023  Jeremiah [name] returns today for follow-up.  Again he was hospitalized at this time at [facility] for severe anemia and acute on chronic heart failure.  I did see him as I was on service in the hospital.  We participated in a diuresis.  Workup revealed severe anemia.  He was given IV iron.  His blood thinners have been stopped.  Subsequently after discharge and substantial diuresis of 15 pounds, he was discharged on 80 mg Lasix daily and Aldactone.  Since follow-up his weight has remained stable or is actually a few pounds lighter.  He recently saw Dr. Sherene [name] with pulmonary who made an adjustment changing his metoprolol over to bisoprolol.  He has noted no change in his symptoms with this.  He was also seen at [facility] for capsule endoscopy.  This suggested that there was some small bowel AVM however options to try to alleviate bleeding from this are limited.  They had  suggested possibly a referral to [facility] to see about a "balloon procedure".  Today he is in good spirits.  He reports his symptoms are pretty stable.  He has had some improvement in nasal allergy symptoms with Flonase and is requesting a refill for that.  He is scheduled to see hematology tomorrow and my partners in October and November to address the Watchman device and possible mitral valve intervention.  PMHx:  Past Medical History:  Diagnosis Date   A-fib (HCC)    Acute on chronic systolic (congestive) heart failure (HCC) 06/10/2017   Acute pulmonary edema (HCC)    Anemia    Aortic valve sclerosis 12/2018   Noted on ECHO   Arthritis    CAD (coronary artery disease)    a. 2006: CABG in 2006 with LIMA-LAD, SVG-OM1, and SVG-RPDA   Cardiomegaly 12/2018   Stable, noted on CXR   Carpal tunnel syndrome    Right   Chronic pain 03/21/2016   Colon cancer (HCC) 2006   COPD (chronic obstructive pulmonary disease) (HCC)    Diabetes mellitus without complication (HCC)    DVT (deep venous thrombosis) (HCC)    Right   Essential hypertension 03/21/2016   GERD (gastroesophageal reflux disease)    History of blood transfusion    History of Clostridioides difficile infection    History of prostate cancer 03/21/2016   History of PSVT (paroxysmal  supraventricular tachycardia)    HLD (hyperlipidemia)    HTN (hypertension)    Hx of CABG 2006   Hypercholesteremia 03/21/2016   Hypothyroidism    LAE (left atrial enlargement) 12/2018   Severe, Noted on ECHO   LVH (left ventricular hypertrophy) 12/2018   Mild, Noted on ECHO   Medication management 03/21/2016   Mitral annular calcification 12/2018   with mild MS, Noted on ECHO   Morbid obesity (HCC) 03/21/2016   Myocardial infarct (HCC)    OSA (obstructive sleep apnea) 03/21/2016   uses oxygen at night time   Pain in right ankle and joints of right foot 03/21/2016   Paronychia 03/21/2016   Phimosis    Pneumonia    Primary insomnia 03/21/2016    Prostate cancer (HCC) 2008   Pulmonary hypertension (HCC) 12/2018   Moderate, Noted on ECHO   Tobacco dependence 03/21/2016   Tricuspid regurgitation 12/2018   Mild, Noted on ECHO    Past Surgical History:  Procedure Laterality Date   ANKLE SURGERY Right 12/2013   CARDIAC CATHETERIZATION N/A 05/08/2016   Procedure: Left Heart Cath and Cors/Grafts Angiography;  Surgeon: Lyn Records, MD;  Location: [facility] INVASIVE CV LAB;  Service: Cardiovascular;  Laterality: N/A;   CIRCUMCISION N/A 10/07/2019   Procedure: CIRCUMCISION ADULT;  Surgeon: Crist Fat, MD;  Location: [facility] ORS;  Service: Urology;  Laterality: N/A;   CORONARY ARTERY BYPASS GRAFT  2006   x3   ELECTROPHYSIOLOGIC STUDY N/A 08/24/2016   Procedure: SVT Ablation;  Surgeon: Will Jorja Loa, MD;  Location: [facility] INVASIVE CV LAB;  Service: Cardiovascular;  Laterality: N/A;   PROSTATE SURGERY  2008   PTCA     UMBILICAL HERNIA REPAIR N/A 05/26/2022   Procedure: PRIMARY REPAIR OF STRANGULATED UMBILICAL HERNIA WITH PARTIAL OMENTECTOMY;  Surgeon: Almond Lint, MD;  Location: [facility] OR;  Service: General;  Laterality: N/A;    FAMHx:  Family History  Problem Relation Age of Onset   Dementia Mother    Diabetes Sister     SOCHx:   reports that he quit smoking about 6 years ago. His smoking use included cigarettes. He started smoking about 66 years ago. He has a 45 pack-year smoking history. He has never used smokeless tobacco. He reports that he does not currently use alcohol. He reports that he does not use drugs.  ALLERGIES:  Allergies  Allergen Reactions   Shellfish Allergy Shortness Of Breath, Nausea And Vomiting and Rash    Scallops     Fluzone [Influenza Virus Vaccine] Nausea And Vomiting, Palpitations and Rash    ROS: Pertinent items noted in HPI and remainder of comprehensive ROS otherwise negative.  HOME MEDS: Current Outpatient Medications  Medication Sig Dispense Refill   aspirin EC 81 MG tablet Take 1 tablet (81 mg  total) by mouth daily. Swallow whole. 90 tablet 3   atorvastatin (LIPITOR) 80 MG tablet Take 80 mg by mouth daily.     budesonide (PULMICORT) 0.5 MG/2ML nebulizer solution Take 2 mLs (0.5 mg total) by nebulization 2 (two) times daily. 120 mL 11   cetirizine (ZYRTEC) 10 MG tablet Take 10 mg by mouth daily as needed for allergies.     colchicine 0.6 MG tablet Take 0.6 mg by mouth daily as needed (Gout).     dexlansoprazole (DEXILANT) 60 MG capsule Take 60 mg by mouth daily.     dicyclomine (BENTYL) 10 MG capsule Take 10 mg by mouth daily as needed for spasms.     ezetimibe (ZETIA)  10 MG tablet TAKE 1 TABLET(10 MG) BY MOUTH DAILY 90 tablet 2   ferrous sulfate (FEROSUL) 325 (65 FE) MG tablet TAKE 1 TABLET BY MOUTH DAILY WITH BREAKFAST (Patient taking differently: Take 325 mg by mouth daily with breakfast.) 90 tablet 3   fluticasone (FLONASE) 50 MCG/ACT nasal spray Place 1 spray into both nostrils daily. 16 g 2   furosemide (LASIX) 40 MG tablet Take 2 tablets (80 mg total) by mouth daily. 30 tablet 0   glimepiride (AMARYL) 4 MG tablet Take 4 mg by mouth daily.     levothyroxine (SYNTHROID, LEVOTHROID) 88 MCG tablet Take 88 mcg by mouth daily before breakfast.     potassium chloride SA (KLOR-CON M) 20 MEQ tablet Take 1 tablet (20 mEq total) by mouth daily. 30 tablet 0   spironolactone (ALDACTONE) 25 MG tablet Take 1 tablet (25 mg total) by mouth daily. 30 tablet 0   No current facility-administered medications for this visit.    LABS/IMAGING: Results for orders placed or performed in visit on 06/25/23 (from the past 48 hour(s))  CBC     Status: Abnormal   Collection Time: 06/25/23  1:49 PM  Result Value Ref Range   WBC 5.3 3.4 - 10.8 x10E3/uL   RBC 2.97 (L) 4.14 - 5.80 x10E6/uL   Hemoglobin 8.8 (L) 13.0 - 17.7 g/dL   Hematocrit 40.9 (L) 81.1 - 51.0 %   MCV 94 79 - 97 fL   MCH 29.6 26.6 - 33.0 pg   MCHC 31.5 31.5 - 35.7 g/dL   RDW 91.4 (H) 78.2 - 95.6 %   Platelets 242 150 - 450 x10E3/uL    ECHOCARDIOGRAM COMPLETE  Result Date: 06/25/2023    ECHOCARDIOGRAM REPORT   Patient Name:   Jeremiah [name]   Date of Exam: 06/25/2023 Medical Rec #:  [id number]     Height:       68.0 in Accession #:    [id number]    Weight:       193.0 lb Date of Birth:  05-01-1946      BSA:          2.013 m Patient Age:    77 years      BP:           85/60 mmHg Patient Gender: M             HR:           79 bpm. Exam Location:  [address] Procedure: 2D Echo, 3D Echo, Cardiac Doppler and Color Doppler Indications:    I05.9 MVD  History:        Patient has prior history of Echocardiogram examinations.                 Cardiomegaly, Previous Myocardial Infarction, COPD,                 Arrythmias:Atrial Fibrillation and PSVT; Risk Factors:Sleep                 Apnea and Hypertension.  Sonographer:    Clearence Ped RCS Referring Phys: [id number] Orbie Pyo IMPRESSIONS  1. Left ventricular ejection fraction, by estimation, is 55 to 60%. The left ventricle has normal function. The left ventricle has no regional wall motion abnormalities. There is moderate asymmetric left ventricular hypertrophy of the basal-septal segment. Left ventricular diastolic parameters are consistent with Grade I diastolic dysfunction (impaired relaxation).  2. Right ventricular systolic function is normal. The right ventricular size is normal. There  is normal pulmonary artery systolic pressure.  3. Left atrial size was severely dilated.  4. The mitral valve is degenerative. Trivial mitral valve regurgitation. Moderate mitral stenosis with mean gradient 9 mmHg and MVA by VTI 1.77 cm^2. Moderate to severe mitral annular calcification. Severe mitral valvular calcification.  5. There is calcification in the LV outflow tract extending anteriorly from the mitral valve. The aortic valve is functionally bicuspid with fusion of the non- and left-coronary cusps. There is moderate calcification of the aortic valve. No aortic insufficiency. There is at least mild  aortic stenosis, mean gradient 10 mmHg.  6. Aortic dilatation noted. There is mild dilatation of the ascending aorta, measuring 39 mm.  7. The inferior vena cava is normal in size with greater than 50% respiratory variability, suggesting right atrial pressure of 3 mmHg. FINDINGS  Left Ventricle: Left ventricular ejection fraction, by estimation, is 55 to 60%. The left ventricle has normal function. The left ventricle has no regional wall motion abnormalities. The left ventricular internal cavity size was normal in size. There is  moderate asymmetric left ventricular hypertrophy of the basal-septal segment. Left ventricular diastolic parameters are consistent with Grade I diastolic dysfunction (impaired relaxation). Right Ventricle: The right ventricular size is normal. No increase in right ventricular wall thickness. Right ventricular systolic function is normal. There is normal pulmonary artery systolic pressure. The tricuspid regurgitant velocity is 2.57 m/s, and  with an assumed right atrial pressure of 3 mmHg, the estimated right ventricular systolic pressure is 29.4 mmHg. Left Atrium: Left atrial size was severely dilated. Right Atrium: Right atrial size was normal in size. Pericardium: There is no evidence of pericardial effusion. Mitral Valve: The mitral valve is degenerative in appearance. There is severe calcification of the mitral valve leaflet(s). Moderate to severe mitral annular calcification. Trivial mitral valve regurgitation. Moderate mitral valve stenosis. MV peak gradient, 15.9 mmHg. The mean mitral valve gradient is 9.0 mmHg. Tricuspid Valve: The tricuspid valve is normal in structure. Tricuspid valve regurgitation is trivial. Aortic Valve: There is calcification in the LV outflow tract extending anteriorly from the mitral valve. The aortic valve is bicuspid. There is moderate calcification of the aortic valve. Aortic valve regurgitation is not visualized. Pulmonic Valve: The pulmonic valve was  normal in structure. Pulmonic valve regurgitation is trivial. Aorta: The aortic root is normal in size and structure and aortic dilatation noted. There is mild dilatation of the ascending aorta, measuring 39 mm. Venous: The inferior vena cava is normal in size with greater than 50% respiratory variability, suggesting right atrial pressure of 3 mmHg. IAS/Shunts: No atrial level shunt detected by color flow Doppler.  LEFT VENTRICLE PLAX 2D LVIDd:         5.50 cm   Diastology LVIDs:         4.10 cm   LV e' medial:    6.31 cm/s LV PW:         1.10 cm   LV E/e' medial:  30.9 LV IVS:        0.90 cm   LV e' lateral:   8.05 cm/s LVOT diam:     2.10 cm   LV E/e' lateral: 24.2 LV SV:         112 LV SV Index:   55 LVOT Area:     3.46 cm                           3D Volume EF:  3D EF:        54 %                          LV EDV:       126 ml                          LV ESV:       57 ml                          LV SV:        68 ml RIGHT VENTRICLE RV Basal diam:  3.90 cm RV S prime:     8.27 cm/s TAPSE (M-mode): 2.0 cm RVSP:           29.4 mmHg LEFT ATRIUM              Index        RIGHT ATRIUM           Index LA diam:        5.80 cm  2.88 cm/m   RA Pressure: 3.00 mmHg LA Vol (A2C):   117.0 ml 58.12 ml/m  RA Area:     15.10 cm LA Vol (A4C):   101.0 ml 50.17 ml/m  RA Volume:   36.00 ml  17.88 ml/m LA Biplane Vol: 109.0 ml 54.15 ml/m  AORTIC VALVE LVOT Vmax:   131.00 cm/s LVOT Vmean:  108.000 cm/s LVOT VTI:    0.322 m  AORTA Ao Root diam: 3.50 cm Ao Asc diam:  3.90 cm MITRAL VALVE                TRICUSPID VALVE MV Area (PHT): 2.53 cm      TR Peak grad:   26.4 mmHg MV Area VTI:   1.77cm       TR Vmax:        257.00 cm/s MV Peak grad:  15.9 mmHg    Estimated RAP:  3.00 mmHg MV Mean grad:  9.0 mmHg     RVSP:           29.4 mmHg MV Vmax:       2.00 m/s MV Vmean:      136.5 cm/s   SHUNTS MV Decel Time: 244 msec     Systemic VTI:  0.32 m MV E velocity: 195.00 cm/s  Systemic Diam: 2.10 cm MV A velocity:  194.00 cm/s MV E/A ratio:  1.01 Dalton McleanMD Electronically signed by Wilfred Lacy Signature Date/Time: 06/25/2023/3:23:50 PM    Final     WEIGHTS: Wt Readings from Last 3 Encounters:  06/26/23 191 lb 6.4 oz (86.8 kg)  06/20/23 193 lb (87.5 kg)  06/11/23 193 lb 2 oz (87.6 kg)    VITALS: BP (!) 118/58 (BP Location: Left Arm, Patient Position: Sitting, Cuff Size: Normal)   Pulse (!) 58   Ht 5\' 8"  (1.727 m)   Wt 191 lb 6.4 oz (86.8 kg)   SpO2 96%   BMI 29.10 kg/m   EXAM: General appearance: alert, no distress, and pale Neck: no carotid bruit, no JVD, and thyroid not enlarged, symmetric, no tenderness/mass/nodules Lungs: diminished breath sounds bilaterally Heart: irregularly irregular rhythm, S1, S2 normal, and systolic murmur: holosystolic 3/6, blowing at 2nd right intercostal space Abdomen: Soft, mild tenderness to palpation Extremities: extremities normal, atraumatic, no cyanosis or edema Pulses: 2+ and symmetric Skin: Pale, cool,  dry Neurologic: Grossly normal Psych: Pleasant  EKG: EKG Interpretation Date/Time:  Wednesday June 26 2023 15:06:14 EDT Ventricular Rate:  77 PR Interval:  174 QRS Duration:  94 QT Interval:  412 QTC Calculation: 466 R Axis:   63  Text Interpretation: Norml sinus rhythm Minimal voltage criteria for LVH, may be normal variant ( Sokolow-Lyon ) Nonspecific T wave abnormality Prolonged QT When compared with ECG of 05-Jun-2023 15:10, Type 2 AVB is no longer noted Confirmed by Zoila Shutter [phone]) on 06/26/2023 4:48:33 PM   ASSESSMENT: Newly recognized atrial fibrillation -CHA2DS2-VASc score of 4, currently in sinus rhythm Moderate aortic stenosis and severe mitral stenosis Ischemic colitis with recent GI bleeding Recent SVT which was symptomatic-repeat cardiac catheterization (05/2016) shows patent grafts, 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   Jeremiah [name] was again hospitalized with severe symptomatic anemia and heart failure.  He diuresed 15 pounds and is feeling much better.  His hemoglobin is, mildly up to 8.5.  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  He was apparently referred to [facility] for management of this.  He seems to be doing well on 80 mg Lasix daily plus spironolactone.  We will continue him off of anticoagulation for the time being.  Fortunately he is in sinus rhythm but I am concerned about significant risk of bleeding.  He has a follow-up scheduled to evaluate for possible Watchman device in October and we will see the structural heart clinic in November for their opinion regarding his mitral valve disease.  Follow-up with me in 6 months.  Chrystie Nose, MD, [facility], FACP  [place]  [facility]  Medical Director of the [facility]  [facility] Diplomate of the American Board of Clinical Lipidology Attending Cardiologist  Direct Dial: [phone]  Fax: [phone]  Website:  [email or web address] 06/26/2023, 4:48 PM

## 2023-06-26 NOTE — Progress Notes (Unsigned)
[facility] [phone]   [phone]  CONSULT NOTE  REFERRING PHYSICIAN: Dr. Jomarie Longs   REASON FOR CONSULTATION:  Iron Deficiency anemia  HPI Jeremiah [name] is a 77 y.o. male with a past medical history significant for SVT, hypertension, NSTEMI, atrial fibrillation, mitral valve stenosis, congestive heart failure, COPD, ischemic colitis, chronic GI bleed, hypothyroidism, type 2 diabetes, difficile, CABG, hyperlipidemia, and morbid obesity for to the clinic for iron deficiency anemia.  Patient reportedly has several hospitalizations secondary to anemia some of which are at [facility].  The patient presented to the emergency room on 06/01/2023 for hemoglobin of 6.9 and received 2 units of blood.  He did not wish to stay for inpatient workup.  Then presented to the emergency room again on 06/05/2023 with a chief complaint of worsening shortness of breath.  She was admitted until 06/11/2023 for acute on chronic diastolic congestive heart failure and acute on chronic respiratory failure with hypoxia.  He was diuresed with Lasix.  Patient had ongoing iron deficiency anemia and heme positive stools for which she is currently undergoing gastro workup with EGD and colonoscopy [facility] last few months which was unremarkable.  They suspected chronic intermittent low-grade GI bleed.  He was referred to our clinic to consider IV iron infusions.  He was taken off anticoagulation several months ago due to his ongoing GI bleeding.  He received an additional 2 units of blood while admitted during his most recent hospitalization.  Cardiology is considering a Watchman after the GI bleeding issues have resolved.  He had a capsule endoscopy performed by Dr. Nadara Mustard at gastroenterology at [facility] showing leading on the small intestines that is likely not endoscopically reachable.  This is going to be discussed with Dr. Gwinda Passe at [facility].   To the patient's knowledge, it  seems the anemia started ***. She her oldest labs available to me are from 2017. The anemia started in January 2018 abruptly and the patient's hemoglobin was 7.8 at that time.  He had persistent anemia from January 2018 to January 2020.  His hemoglobin normalized in October 2020.  It appears the recurrent and mild anemia anemia started occurring again in March 2024 and has been worsening since that time.  Started having severe anemia in April 2024.  On iron supplements?  Ever needed a iron infusion before?  It appears that the patient received Ferrlecit on 06/06/2023, 7/5 and 7 6 and 7 7.  With 250 mg   Overall regarding symptoms, he reports ***fatigue, lightheadedness, dyspnea on exertion.  He is on chronic supplemental oxygen with  ***liters via nasal cannula. He denies gingival bleeding, epistaxis, hemoptysis, hematemesis, hematochezia, or melena***.  He denies any ibuprofen use and takes Tylenol. Denies blood thinner use, as he has been off this for several months..He denies any lymphadenopathy. She denies any particular dietary habits such as being a vegan or vegetarian, however, ***.  Iron rich food?  Craving ice chips?    HPI  Past Medical History:  Diagnosis Date   A-fib (HCC)    Acute on chronic systolic (congestive) heart failure (HCC) 06/10/2017   Acute pulmonary edema (HCC)    Anemia    Aortic valve sclerosis 12/2018   Noted on ECHO   Arthritis    CAD (coronary artery disease)    a. 2006: CABG in 2006 with LIMA-LAD, SVG-OM1, and SVG-RPDA   Cardiomegaly 12/2018   Stable, noted on CXR   Carpal tunnel syndrome  Right   Chronic pain 03/21/2016   Colon cancer (HCC) 2006   COPD (chronic obstructive pulmonary disease) (HCC)    Diabetes mellitus without complication (HCC)    DVT (deep venous thrombosis) (HCC)    Right   Essential hypertension 03/21/2016   GERD (gastroesophageal reflux disease)    History of blood transfusion    History of Clostridioides difficile infection     History of prostate cancer 03/21/2016   History of PSVT (paroxysmal supraventricular tachycardia)    HLD (hyperlipidemia)    HTN (hypertension)    Hx of CABG 2006   Hypercholesteremia 03/21/2016   Hypothyroidism    LAE (left atrial enlargement) 12/2018   Severe, Noted on ECHO   LVH (left ventricular hypertrophy) 12/2018   Mild, Noted on ECHO   Medication management 03/21/2016   Mitral annular calcification 12/2018   with mild MS, Noted on ECHO   Morbid obesity (HCC) 03/21/2016   Myocardial infarct (HCC)    OSA (obstructive sleep apnea) 03/21/2016   uses oxygen at night time   Pain in right ankle and joints of right foot 03/21/2016   Paronychia 03/21/2016   Phimosis    Pneumonia    Primary insomnia 03/21/2016   Prostate cancer (HCC) 2008   Pulmonary hypertension (HCC) 12/2018   Moderate, Noted on ECHO   Tobacco dependence 03/21/2016   Tricuspid regurgitation 12/2018   Mild, Noted on ECHO    Past Surgical History:  Procedure Laterality Date   ANKLE SURGERY Right 12/2013   CARDIAC CATHETERIZATION N/A 05/08/2016   Procedure: Left Heart Cath and Cors/Grafts Angiography;  Surgeon: Lyn Records, MD;  Location: [facility] INVASIVE CV LAB;  Service: Cardiovascular;  Laterality: N/A;   CIRCUMCISION N/A 10/07/2019   Procedure: CIRCUMCISION ADULT;  Surgeon: Crist Fat, MD;  Location: [facility] ORS;  Service: Urology;  Laterality: N/A;   CORONARY ARTERY BYPASS GRAFT  2006   x3   ELECTROPHYSIOLOGIC STUDY N/A 08/24/2016   Procedure: SVT Ablation;  Surgeon: Will Jorja Loa, MD;  Location: [facility] INVASIVE CV LAB;  Service: Cardiovascular;  Laterality: N/A;   PROSTATE SURGERY  2008   PTCA     UMBILICAL HERNIA REPAIR N/A 05/26/2022   Procedure: PRIMARY REPAIR OF STRANGULATED UMBILICAL HERNIA WITH PARTIAL OMENTECTOMY;  Surgeon: Almond Lint, MD;  Location: [facility] OR;  Service: General;  Laterality: N/A;    Family History  Problem Relation Age of Onset   Dementia Mother    Diabetes Sister      Social History Social History   Tobacco Use   Smoking status: Former    Current packs/day: 0.00    Average packs/day: 0.8 packs/day for 60.0 years (45.0 ttl pk-yrs)    Types: Cigarettes    Start date: 12/15/1956    Quit date: 12/15/2016    Years since quitting: 6.5   Smokeless tobacco: Never  Vaping Use   Vaping status: Never Used  Substance Use Topics   Alcohol use: Not Currently    Comment: very seldom   Drug use: No    Allergies  Allergen Reactions   Shellfish Allergy Shortness Of Breath, Nausea And Vomiting and Rash    Scallops     Fluzone [Influenza Virus Vaccine] Nausea And Vomiting, Palpitations and Rash    Current Outpatient Medications  Medication Sig Dispense Refill   aspirin EC 81 MG tablet Take 1 tablet (81 mg total) by mouth daily. Swallow whole. 90 tablet 3   atorvastatin (LIPITOR) 80 MG tablet Take 80 mg by mouth daily.  budesonide (PULMICORT) 0.5 MG/2ML nebulizer solution Take 2 mLs (0.5 mg total) by nebulization 2 (two) times daily. 120 mL 11   cetirizine (ZYRTEC) 10 MG tablet Take 10 mg by mouth daily as needed for allergies.     colchicine 0.6 MG tablet Take 0.6 mg by mouth daily as needed (Gout).     dexlansoprazole (DEXILANT) 60 MG capsule Take 60 mg by mouth daily.     dicyclomine (BENTYL) 10 MG capsule Take 10 mg by mouth daily as needed for spasms.     ezetimibe (ZETIA) 10 MG tablet TAKE 1 TABLET(10 MG) BY MOUTH DAILY 90 tablet 2   ferrous sulfate (FEROSUL) 325 (65 FE) MG tablet TAKE 1 TABLET BY MOUTH DAILY WITH BREAKFAST (Patient taking differently: Take 325 mg by mouth daily with breakfast.) 90 tablet 3   furosemide (LASIX) 40 MG tablet Take 2 tablets (80 mg total) by mouth daily. 30 tablet 0   glimepiride (AMARYL) 4 MG tablet Take 4 mg by mouth daily.     levothyroxine (SYNTHROID, LEVOTHROID) 88 MCG tablet Take 88 mcg by mouth daily before breakfast.     potassium chloride SA (KLOR-CON M) 20 MEQ tablet Take 1 tablet (20 mEq total) by mouth  daily. 30 tablet 0   spironolactone (ALDACTONE) 25 MG tablet Take 1 tablet (25 mg total) by mouth daily. 30 tablet 0   No current facility-administered medications for this visit.    REVIEW OF SYSTEMS:   Review of Systems  Constitutional: Negative for appetite change, chills, fatigue, fever and unexpected weight change.  HENT:   Negative for mouth sores, nosebleeds, sore throat and trouble swallowing.   Eyes: Negative for eye problems and icterus.  Respiratory: Negative for cough, hemoptysis, shortness of breath and wheezing.   Cardiovascular: Negative for chest pain and leg swelling.  Gastrointestinal: Negative for abdominal pain, constipation, diarrhea, nausea and vomiting.  Genitourinary: Negative for bladder incontinence, difficulty urinating, dysuria, frequency and hematuria.   Musculoskeletal: Negative for back pain, gait problem, neck pain and neck stiffness.  Skin: Negative for itching and rash.  Neurological: Negative for dizziness, extremity weakness, gait problem, headaches, light-headedness and seizures.  Hematological: Negative for adenopathy. Does not bruise/bleed easily.  Psychiatric/Behavioral: Negative for confusion, depression and sleep disturbance. The patient is not nervous/anxious.     PHYSICAL EXAMINATION:  There were no vitals taken for this visit.  ECOG PERFORMANCE STATUS: {CHL ONC ECOG [id number]  Physical Exam  Constitutional: Oriented to person, place, and time and well-developed, well-nourished, and in no distress. No distress.  HENT:  Head: Normocephalic and atraumatic.  Mouth/Throat: Oropharynx is clear and moist. No oropharyngeal exudate.  Eyes: Conjunctivae are normal. Right eye exhibits no discharge. Left eye exhibits no discharge. No scleral icterus.  Neck: Normal range of motion. Neck supple.  Cardiovascular: Normal rate, regular rhythm, normal heart sounds and intact distal pulses.   Pulmonary/Chest: Effort normal and breath sounds normal. No  respiratory distress. No wheezes. No rales.  Abdominal: Soft. Bowel sounds are normal. Exhibits no distension and no mass. There is no tenderness.  Musculoskeletal: Normal range of motion. Exhibits no edema.  Lymphadenopathy:    No cervical adenopathy.  Neurological: Alert and oriented to person, place, and time. Exhibits normal muscle tone. Gait normal. Coordination normal.  Skin: Skin is warm and dry. No rash noted. Not diaphoretic. No erythema. No pallor.  Psychiatric: Mood, memory and judgment normal.  Vitals reviewed.  LABORATORY DATA: Lab Results  Component Value Date   WBC 5.3 06/25/2023  HGB 8.8 (L) 06/25/2023   HCT 27.9 (L) 06/25/2023   MCV 94 06/25/2023   PLT 242 06/25/2023      Chemistry      Component Value Date/Time   NA 138 06/11/2023 0052   NA 142 03/21/2021 1217   K 4.3 06/11/2023 0052   CL 95 (L) 06/11/2023 0052   CO2 30 06/11/2023 0052   BUN 33 (H) 06/11/2023 0052   BUN 25 03/21/2021 1217   CREATININE 0.94 06/11/2023 0052      Component Value Date/Time   CALCIUM 9.6 06/11/2023 0052   ALKPHOS 53 06/06/2023 0013   AST 15 06/06/2023 0013   ALT 17 06/06/2023 0013   BILITOT 0.6 06/06/2023 0013       RADIOGRAPHIC STUDIES: ECHOCARDIOGRAM COMPLETE  Result Date: 06/25/2023    ECHOCARDIOGRAM REPORT   Patient Name:   Jeremiah [name]   Date of Exam: 06/25/2023 Medical Rec #:  [id number]     Height:       68.0 in Accession #:    [id number]    Weight:       193.0 lb Date of Birth:  01-Oct-1946      BSA:          2.013 m Patient Age:    77 years      BP:           85/60 mmHg Patient Gender: M             HR:           79 bpm. Exam Location:  [address] Procedure: 2D Echo, 3D Echo, Cardiac Doppler and Color Doppler Indications:    I05.9 MVD  History:        Patient has prior history of Echocardiogram examinations.                 Cardiomegaly, Previous Myocardial Infarction, COPD,                 Arrythmias:Atrial Fibrillation and PSVT; Risk Factors:Sleep                  Apnea and Hypertension.  Sonographer:    Clearence Ped RCS Referring Phys: [id number] Orbie Pyo IMPRESSIONS  1. Left ventricular ejection fraction, by estimation, is 55 to 60%. The left ventricle has normal function. The left ventricle has no regional wall motion abnormalities. There is moderate asymmetric left ventricular hypertrophy of the basal-septal segment. Left ventricular diastolic parameters are consistent with Grade I diastolic dysfunction (impaired relaxation).  2. Right ventricular systolic function is normal. The right ventricular size is normal. There is normal pulmonary artery systolic pressure.  3. Left atrial size was severely dilated.  4. The mitral valve is degenerative. Trivial mitral valve regurgitation. Moderate mitral stenosis with mean gradient 9 mmHg and MVA by VTI 1.77 cm^2. Moderate to severe mitral annular calcification. Severe mitral valvular calcification.  5. There is calcification in the LV outflow tract extending anteriorly from the mitral valve. The aortic valve is functionally bicuspid with fusion of the non- and left-coronary cusps. There is moderate calcification of the aortic valve. No aortic insufficiency. There is at least mild aortic stenosis, mean gradient 10 mmHg.  6. Aortic dilatation noted. There is mild dilatation of the ascending aorta, measuring 39 mm.  7. The inferior vena cava is normal in size with greater than 50% respiratory variability, suggesting right atrial pressure of 3 mmHg. FINDINGS  Left Ventricle: Left ventricular ejection fraction, by estimation, is 55 to  60%. The left ventricle has normal function. The left ventricle has no regional wall motion abnormalities. The left ventricular internal cavity size was normal in size. There is  moderate asymmetric left ventricular hypertrophy of the basal-septal segment. Left ventricular diastolic parameters are consistent with Grade I diastolic dysfunction (impaired relaxation). Right Ventricle: The right  ventricular size is normal. No increase in right ventricular wall thickness. Right ventricular systolic function is normal. There is normal pulmonary artery systolic pressure. The tricuspid regurgitant velocity is 2.57 m/s, and  with an assumed right atrial pressure of 3 mmHg, the estimated right ventricular systolic pressure is 29.4 mmHg. Left Atrium: Left atrial size was severely dilated. Right Atrium: Right atrial size was normal in size. Pericardium: There is no evidence of pericardial effusion. Mitral Valve: The mitral valve is degenerative in appearance. There is severe calcification of the mitral valve leaflet(s). Moderate to severe mitral annular calcification. Trivial mitral valve regurgitation. Moderate mitral valve stenosis. MV peak gradient, 15.9 mmHg. The mean mitral valve gradient is 9.0 mmHg. Tricuspid Valve: The tricuspid valve is normal in structure. Tricuspid valve regurgitation is trivial. Aortic Valve: There is calcification in the LV outflow tract extending anteriorly from the mitral valve. The aortic valve is bicuspid. There is moderate calcification of the aortic valve. Aortic valve regurgitation is not visualized. Pulmonic Valve: The pulmonic valve was normal in structure. Pulmonic valve regurgitation is trivial. Aorta: The aortic root is normal in size and structure and aortic dilatation noted. There is mild dilatation of the ascending aorta, measuring 39 mm. Venous: The inferior vena cava is normal in size with greater than 50% respiratory variability, suggesting right atrial pressure of 3 mmHg. IAS/Shunts: No atrial level shunt detected by color flow Doppler.  LEFT VENTRICLE PLAX 2D LVIDd:         5.50 cm   Diastology LVIDs:         4.10 cm   LV e' medial:    6.31 cm/s LV PW:         1.10 cm   LV E/e' medial:  30.9 LV IVS:        0.90 cm   LV e' lateral:   8.05 cm/s LVOT diam:     2.10 cm   LV E/e' lateral: 24.2 LV SV:         112 LV SV Index:   55 LVOT Area:     3.46 cm                            3D Volume EF:                          3D EF:        54 %                          LV EDV:       126 ml                          LV ESV:       57 ml                          LV SV:        68 ml RIGHT VENTRICLE RV Basal diam:  3.90 cm RV S prime:  8.27 cm/s TAPSE (M-mode): 2.0 cm RVSP:           29.4 mmHg LEFT ATRIUM              Index        RIGHT ATRIUM           Index LA diam:        5.80 cm  2.88 cm/m   RA Pressure: 3.00 mmHg LA Vol (A2C):   117.0 ml 58.12 ml/m  RA Area:     15.10 cm LA Vol (A4C):   101.0 ml 50.17 ml/m  RA Volume:   36.00 ml  17.88 ml/m LA Biplane Vol: 109.0 ml 54.15 ml/m  AORTIC VALVE LVOT Vmax:   131.00 cm/s LVOT Vmean:  108.000 cm/s LVOT VTI:    0.322 m  AORTA Ao Root diam: 3.50 cm Ao Asc diam:  3.90 cm MITRAL VALVE                TRICUSPID VALVE MV Area (PHT): 2.53 cm      TR Peak grad:   26.4 mmHg MV Area VTI:   1.77cm       TR Vmax:        257.00 cm/s MV Peak grad:  15.9 mmHg    Estimated RAP:  3.00 mmHg MV Mean grad:  9.0 mmHg     RVSP:           29.4 mmHg MV Vmax:       2.00 m/s MV Vmean:      136.5 cm/s   SHUNTS MV Decel Time: 244 msec     Systemic VTI:  0.32 m MV E velocity: 195.00 cm/s  Systemic Diam: 2.10 cm MV A velocity: 194.00 cm/s MV E/A ratio:  1.01 Dalton McleanMD Electronically signed by Wilfred Lacy Signature Date/Time: 06/25/2023/3:23:50 PM    Final    DG Chest Port 1 View  Result Date: 06/05/2023 CLINICAL DATA:  Shortness of breath. EXAM: PORTABLE CHEST 1 VIEW COMPARISON:  Chest radiograph 12/25/2018. FINDINGS: Mild interstitial prominence. No confluent consolidation. No visible pneumothorax. Possible small left pleural effusion. Cardiomegaly. Median sternotomy. Pulmonary arterial enlargement. IMPRESSION: 1. Cardiomegaly and mild interstitial prominence, which could represent the sequela of recurrent bouts of CHF or mild interstitial edema. No confluent consolidation. 2. Possible small left pleural effusion. Electronically Signed   By: Feliberto Harts  M.D.   On: 06/05/2023 15:22    ASSESSMENT: This is a very pleasant 77 year old Caucasian male referred to the clinic for iron deficiency anemia secondary to chronic GI bleeding.  The patient has received Ferrlecit most being 06/09/2023.  With 250 mg.  Iron supplement?  The patient was seen with Dr. Arbutus Ped today.  The patient several lab studies today including CBC, iron studies, ferritin, CMP, sample blood bank, B12, and folate testing.  His labs from today show ***  I will arrange for  ***  Will see him back for follow-up visit in ***  Iron supplement?  He encouraged to increase his intake of iron rich food.  He was given a handout on his AVS.  He will continue follow-up with GI regarding the GI bleeding.  He recently had a capsule endoscopy and there was some area of bleeding in the small intestine that is not reachable.  He is expected to follow-up with them regarding his options on ***.  Cardiology is currently holding his blood thinner.  After his GI bleeding resolves they may consider him for watchman's procedure.  The patient voices understanding of current disease status and treatment options and is in agreement with the current care plan.  All questions were answered. The patient knows to call the clinic with any problems, questions or concerns. We can certainly see the patient much sooner if necessary.  Thank you so much for allowing me to participate in the care of Demosthenes Virnig. I will continue to follow up the patient with you and assist in his care.  I spent {CHL ONC TIME VISIT - SEGBT:[id number]} counseling the patient face to face. The total time spent in the appointment was {CHL ONC TIME VISIT - TGGYI:[id number]}.  Disclaimer: This note was dictated with voice recognition software. Similar sounding words can inadvertently be transcribed and may not be corrected upon review.   Quita Mcgrory L Dotti Busey June 26, 2023, 7:38 AM

## 2023-06-27 ENCOUNTER — Inpatient Hospital Stay: Payer: Federal, State, Local not specified - PPO

## 2023-06-27 ENCOUNTER — Inpatient Hospital Stay: Payer: Federal, State, Local not specified - PPO | Attending: Physician Assistant | Admitting: Physician Assistant

## 2023-06-27 VITALS — BP 100/55 | HR 83 | Temp 97.8°F | Resp 17 | Ht 68.0 in | Wt 192.1 lb

## 2023-06-27 DIAGNOSIS — D500 Iron deficiency anemia secondary to blood loss (chronic): Secondary | ICD-10-CM | POA: Diagnosis not present

## 2023-06-27 DIAGNOSIS — Z87891 Personal history of nicotine dependence: Secondary | ICD-10-CM | POA: Insufficient documentation

## 2023-06-27 DIAGNOSIS — K922 Gastrointestinal hemorrhage, unspecified: Secondary | ICD-10-CM | POA: Diagnosis not present

## 2023-06-27 DIAGNOSIS — D649 Anemia, unspecified: Secondary | ICD-10-CM

## 2023-06-27 DIAGNOSIS — E538 Deficiency of other specified B group vitamins: Secondary | ICD-10-CM | POA: Insufficient documentation

## 2023-06-27 LAB — CMP (CANCER CENTER ONLY)
ALT: 19 U/L (ref 0–44)
AST: 19 U/L (ref 15–41)
Albumin: 4.2 g/dL (ref 3.5–5.0)
Alkaline Phosphatase: 82 U/L (ref 38–126)
Anion gap: 7 (ref 5–15)
BUN: 24 mg/dL — ABNORMAL HIGH (ref 8–23)
CO2: 30 mmol/L (ref 22–32)
Calcium: 9.4 mg/dL (ref 8.9–10.3)
Chloride: 102 mmol/L (ref 98–111)
Creatinine: 0.92 mg/dL (ref 0.61–1.24)
GFR, Estimated: >60 mL/min (ref >60)
Glucose, Bld: 102 mg/dL — ABNORMAL HIGH (ref 70–99)
Potassium: 3.9 mmol/L (ref 3.5–5.1)
Sodium: 139 mmol/L (ref 135–145)
Total Bilirubin: 0.3 mg/dL (ref 0.3–1.2)
Total Protein: 6.8 g/dL (ref 6.5–8.1)

## 2023-06-27 LAB — CBC WITH DIFFERENTIAL (CANCER CENTER ONLY)
Abs Immature Granulocytes: 0.02 10*3/uL (ref 0.00–0.07)
Basophils Absolute: 0 10*3/uL (ref 0.0–0.1)
Basophils Relative: 1 %
Eosinophils Absolute: 0.1 10*3/uL (ref 0.0–0.5)
Eosinophils Relative: 2 %
HCT: 26 % — ABNORMAL LOW (ref 39.0–52.0)
Hemoglobin: 8.2 g/dL — ABNORMAL LOW (ref 13.0–17.0)
Immature Granulocytes: 1 %
Lymphocytes Relative: 29 %
Lymphs Abs: 1.2 10*3/uL (ref 0.7–4.0)
MCH: 30.1 pg (ref 26.0–34.0)
MCHC: 31.5 g/dL (ref 30.0–36.0)
MCV: 95.6 fL (ref 80.0–100.0)
Monocytes Absolute: 0.4 10*3/uL (ref 0.1–1.0)
Monocytes Relative: 11 %
Neutro Abs: 2.4 10*3/uL (ref 1.7–7.7)
Neutrophils Relative %: 56 %
Platelet Count: 185 10*3/uL (ref 150–400)
RBC: 2.72 MIL/uL — ABNORMAL LOW (ref 4.22–5.81)
RDW: 21.6 % — ABNORMAL HIGH (ref 11.5–15.5)
WBC Count: 4 10*3/uL (ref 4.0–10.5)
nRBC: 0 % (ref 0.0–0.2)

## 2023-06-27 LAB — FERRITIN: Ferritin: 37 ng/mL (ref 24–336)

## 2023-06-27 LAB — FOLATE: Folate: 18.5 ng/mL (ref >5.9)

## 2023-06-27 LAB — SAMPLE TO BLOOD BANK

## 2023-06-27 LAB — VITAMIN B12: Vitamin B-12: 175 pg/mL — ABNORMAL LOW (ref 180–914)

## 2023-06-27 LAB — IRON AND IRON BINDING CAPACITY (CC-WL,HP ONLY)
Iron: 79 ug/dL (ref 45–182)
Saturation Ratios: 19 % (ref 17.9–39.5)
TIBC: 426 ug/dL (ref 250–450)
UIBC: 347 ug/dL (ref 117–376)

## 2023-07-03 ENCOUNTER — Telehealth: Payer: Self-pay | Admitting: Internal Medicine

## 2023-07-07 ENCOUNTER — Other Ambulatory Visit: Payer: Self-pay | Admitting: Internal Medicine

## 2023-07-08 ENCOUNTER — Other Ambulatory Visit (HOSPITAL_COMMUNITY): Payer: Self-pay

## 2023-07-08 ENCOUNTER — Other Ambulatory Visit: Payer: Self-pay | Admitting: Internal Medicine

## 2023-07-09 ENCOUNTER — Other Ambulatory Visit: Payer: Federal, State, Local not specified - PPO

## 2023-07-09 ENCOUNTER — Ambulatory Visit: Payer: Federal, State, Local not specified - PPO | Admitting: Physician Assistant

## 2023-07-10 ENCOUNTER — Inpatient Hospital Stay: Payer: Federal, State, Local not specified - PPO | Attending: Physician Assistant

## 2023-07-10 ENCOUNTER — Other Ambulatory Visit (HOSPITAL_COMMUNITY): Payer: Self-pay

## 2023-07-10 ENCOUNTER — Inpatient Hospital Stay: Payer: Federal, State, Local not specified - PPO | Admitting: Internal Medicine

## 2023-07-10 ENCOUNTER — Other Ambulatory Visit: Payer: Self-pay

## 2023-07-10 VITALS — BP 95/55 | HR 88 | Temp 98.0°F | Resp 20 | Wt 195.0 lb

## 2023-07-10 DIAGNOSIS — D649 Anemia, unspecified: Secondary | ICD-10-CM

## 2023-07-10 DIAGNOSIS — D500 Iron deficiency anemia secondary to blood loss (chronic): Secondary | ICD-10-CM | POA: Diagnosis not present

## 2023-07-10 DIAGNOSIS — Z8546 Personal history of malignant neoplasm of prostate: Secondary | ICD-10-CM | POA: Insufficient documentation

## 2023-07-10 DIAGNOSIS — K922 Gastrointestinal hemorrhage, unspecified: Secondary | ICD-10-CM | POA: Diagnosis not present

## 2023-07-10 DIAGNOSIS — Z85038 Personal history of other malignant neoplasm of large intestine: Secondary | ICD-10-CM | POA: Insufficient documentation

## 2023-07-10 LAB — CBC WITH DIFFERENTIAL (CANCER CENTER ONLY)
Abs Immature Granulocytes: 0.02 10*3/uL (ref 0.00–0.07)
Basophils Absolute: 0 10*3/uL (ref 0.0–0.1)
Basophils Relative: 1 %
Eosinophils Absolute: 0.1 10*3/uL (ref 0.0–0.5)
Eosinophils Relative: 3 %
HCT: 32.3 % — ABNORMAL LOW (ref 39.0–52.0)
Hemoglobin: 10 g/dL — ABNORMAL LOW (ref 13.0–17.0)
Immature Granulocytes: 1 %
Lymphocytes Relative: 24 %
Lymphs Abs: 0.9 10*3/uL (ref 0.7–4.0)
MCH: 29.7 pg (ref 26.0–34.0)
MCHC: 31 g/dL (ref 30.0–36.0)
MCV: 95.8 fL (ref 80.0–100.0)
Monocytes Absolute: 0.4 10*3/uL (ref 0.1–1.0)
Monocytes Relative: 10 %
Neutro Abs: 2.3 10*3/uL (ref 1.7–7.7)
Neutrophils Relative %: 61 %
Platelet Count: 185 10*3/uL (ref 150–400)
RBC: 3.37 MIL/uL — ABNORMAL LOW (ref 4.22–5.81)
RDW: 17.9 % — ABNORMAL HIGH (ref 11.5–15.5)
WBC Count: 3.8 10*3/uL — ABNORMAL LOW (ref 4.0–10.5)
nRBC: 0 % (ref 0.0–0.2)

## 2023-07-10 LAB — SAMPLE TO BLOOD BANK

## 2023-07-10 NOTE — Progress Notes (Signed)
[facility] [phone]   [phone]  OFFICE PROGRESS NOTE  Koirala, Dibas, MD [address] [place] [address]  DIAGNOSIS: Iron deficiency anemia secondary to chronic gastrointestinal hemorrhage  PRIOR THERAPY: Iron infusion with Ferrlecit 250 Mg IV for 4 doses.  Last dose was given on 06/09/2023  CURRENT THERAPY: Oral ferrous sulfate with orange juice as well as oral vitamin B12 1000 mcg p.o. daily  INTERVAL HISTORY: Jeremiah [name] 77 y.o. male returns to the clinic today for follow-up visit.  The patient is feeling much better today with no concerning complaints except for the baseline shortness of breath and he is currently on home oxygen.  The patient has less fatigue than before.  He denied having any current chest pain, cough or hemoptysis.  He has no nausea, vomiting, diarrhea or constipation.  He has no headache or visual changes.  His capsule endoscopy at [facility] showed bleeding in the distal ileum and he was referred to [facility] for consideration of balloon endoscopy and ablation.  He is currently tolerating his oral iron tablet as well as vitamin B12 supplement fairly well.  He is here for evaluation and repeat blood work.  MEDICAL HISTORY: Past Medical History:  Diagnosis Date   A-fib [facility])    Acute on chronic systolic (congestive) heart failure (HCC) 06/10/2017   Acute pulmonary edema (HCC)    Anemia    Aortic valve sclerosis 12/2018   Noted on ECHO   Arthritis    CAD (coronary artery disease)    a. 2006: CABG in 2006 with LIMA-LAD, SVG-OM1, and SVG-RPDA   Cardiomegaly 12/2018   Stable, noted on CXR   Carpal tunnel syndrome    Right   Chronic pain 03/21/2016   Colon cancer (HCC) 2006   COPD (chronic obstructive pulmonary disease) (HCC)    Diabetes mellitus without complication (HCC)    DVT (deep venous thrombosis) (HCC)    Right   Essential hypertension 03/21/2016   GERD (gastroesophageal  reflux disease)    History of blood transfusion    History of Clostridioides difficile infection    History of prostate cancer 03/21/2016   History of PSVT (paroxysmal supraventricular tachycardia)    HLD (hyperlipidemia)    HTN (hypertension)    Hx of CABG 2006   Hypercholesteremia 03/21/2016   Hypothyroidism    LAE (left atrial enlargement) 12/2018   Severe, Noted on ECHO   LVH (left ventricular hypertrophy) 12/2018   Mild, Noted on ECHO   Medication management 03/21/2016   Mitral annular calcification 12/2018   with mild MS, Noted on ECHO   Morbid obesity (HCC) 03/21/2016   Myocardial infarct (HCC)    OSA (obstructive sleep apnea) 03/21/2016   uses oxygen at night time   Pain in right ankle and joints of right foot 03/21/2016   Paronychia 03/21/2016   Phimosis    Pneumonia    Primary insomnia 03/21/2016   Prostate cancer (HCC) 2008   Pulmonary hypertension (HCC) 12/2018   Moderate, Noted on ECHO   Tobacco dependence 03/21/2016   Tricuspid regurgitation 12/2018   Mild, Noted on ECHO    ALLERGIES:  is allergic to shellfish allergy and fluzone [influenza virus vaccine].  MEDICATIONS:  Current Outpatient Medications  Medication Sig Dispense Refill   aspirin EC 81 MG tablet Take 1 tablet (81 mg total) by mouth daily. Swallow whole. 90 tablet 3   atorvastatin (LIPITOR) 80 MG tablet Take 80 mg  by mouth daily.     budesonide (PULMICORT) 0.5 MG/2ML nebulizer solution Take 2 mLs (0.5 mg total) by nebulization 2 (two) times daily. 120 mL 11   cetirizine (ZYRTEC) 10 MG tablet Take 10 mg by mouth daily as needed for allergies.     colchicine 0.6 MG tablet Take 0.6 mg by mouth daily as needed (Gout).     dexlansoprazole (DEXILANT) 60 MG capsule Take 60 mg by mouth daily.     dicyclomine (BENTYL) 10 MG capsule Take 10 mg by mouth daily as needed for spasms.     ezetimibe (ZETIA) 10 MG tablet TAKE 1 TABLET(10 MG) BY MOUTH DAILY 90 tablet 2   FEROSUL 325 (65 Fe) MG tablet TAKE 1  TABLET BY MOUTH DAILY WITH BREAKFAST 90 tablet 3   fluticasone (FLONASE) 50 MCG/ACT nasal spray Place 1 spray into both nostrils daily. 16 g 2   furosemide (LASIX) 40 MG tablet Take 2 tablets (80 mg total) by mouth daily. 30 tablet 0   glimepiride (AMARYL) 4 MG tablet Take 4 mg by mouth daily.     levothyroxine (SYNTHROID, LEVOTHROID) 88 MCG tablet Take 88 mcg by mouth daily before breakfast.     potassium chloride SA (KLOR-CON M) 20 MEQ tablet Take 1 tablet (20 mEq total) by mouth daily. 30 tablet 0   spironolactone (ALDACTONE) 25 MG tablet Take 1 tablet (25 mg total) by mouth daily. 30 tablet 0   No current facility-administered medications for this visit.    SURGICAL HISTORY:  Past Surgical History:  Procedure Laterality Date   ANKLE SURGERY Right 12/2013   CARDIAC CATHETERIZATION N/A 05/08/2016   Procedure: Left Heart Cath and Cors/Grafts Angiography;  Surgeon: Lyn Records, MD;  Location: [facility] INVASIVE CV LAB;  Service: Cardiovascular;  Laterality: N/A;   CIRCUMCISION N/A 10/07/2019   Procedure: CIRCUMCISION ADULT;  Surgeon: Crist Fat, MD;  Location: [facility] ORS;  Service: Urology;  Laterality: N/A;   CORONARY ARTERY BYPASS GRAFT  2006   x3   ELECTROPHYSIOLOGIC STUDY N/A 08/24/2016   Procedure: SVT Ablation;  Surgeon: Will Jorja Loa, MD;  Location: [facility] INVASIVE CV LAB;  Service: Cardiovascular;  Laterality: N/A;   PROSTATE SURGERY  2008   PTCA     UMBILICAL HERNIA REPAIR N/A 05/26/2022   Procedure: PRIMARY REPAIR OF STRANGULATED UMBILICAL HERNIA WITH PARTIAL OMENTECTOMY;  Surgeon: Almond Lint, MD;  Location: [facility] OR;  Service: General;  Laterality: N/A;    REVIEW OF SYSTEMS:  A comprehensive review of systems was negative except for: Constitutional: positive for fatigue Respiratory: positive for dyspnea on exertion   PHYSICAL EXAMINATION: General appearance: alert, cooperative, fatigued, and no distress Head: Normocephalic, without obvious abnormality, atraumatic Neck: no  adenopathy, no JVD, supple, symmetrical, trachea midline, and thyroid not enlarged, symmetric, no tenderness/mass/nodules Lymph nodes: Cervical, supraclavicular, and axillary nodes normal. Resp: clear to auscultation bilaterally Back: symmetric, no curvature. ROM normal. No CVA tenderness. Cardio: regular rate and rhythm, S1, S2 normal, no murmur, click, rub or gallop GI: soft, non-tender; bowel sounds normal; no masses,  no organomegaly Extremities: extremities normal, atraumatic, no cyanosis or edema  ECOG PERFORMANCE STATUS: 1 - Symptomatic but completely ambulatory  Blood pressure (!) 95/55, pulse 88, temperature 98 F (36.7 C), temperature source Oral, resp. rate 20, weight 195 lb (88.5 kg), SpO2 97%.  LABORATORY DATA: Lab Results  Component Value Date   WBC 3.8 (L) 07/10/2023   HGB 10.0 (L) 07/10/2023   HCT 32.3 (L) 07/10/2023   MCV 95.8  07/10/2023   PLT 185 07/10/2023      Chemistry      Component Value Date/Time   NA 139 06/27/2023 1342   NA 142 03/21/2021 1217   K 3.9 06/27/2023 1342   CL 102 06/27/2023 1342   CO2 30 06/27/2023 1342   BUN 24 (H) 06/27/2023 1342   BUN 25 03/21/2021 1217   CREATININE 0.92 06/27/2023 1342      Component Value Date/Time   CALCIUM 9.4 06/27/2023 1342   ALKPHOS 82 06/27/2023 1342   AST 19 06/27/2023 1342   ALT 19 06/27/2023 1342   BILITOT 0.3 06/27/2023 1342       RADIOGRAPHIC STUDIES: ECHOCARDIOGRAM COMPLETE  Result Date: 06/25/2023    ECHOCARDIOGRAM REPORT   Patient Name:   Jeremiah [name]   Date of Exam: 06/25/2023 Medical Rec #:  [id number]     Height:       68.0 in Accession #:    [id number]    Weight:       193.0 lb Date of Birth:  1946-07-14      BSA:          2.013 m Patient Age:    77 years      BP:           85/60 mmHg Patient Gender: M             HR:           79 bpm. Exam Location:  [address] Procedure: 2D Echo, 3D Echo, Cardiac Doppler and Color Doppler Indications:    I05.9 MVD  History:        Patient has prior history  of Echocardiogram examinations.                 Cardiomegaly, Previous Myocardial Infarction, COPD,                 Arrythmias:Atrial Fibrillation and PSVT; Risk Factors:Sleep                 Apnea and Hypertension.  Sonographer:    Clearence Ped RCS Referring Phys: [id number] Orbie Pyo IMPRESSIONS  1. Left ventricular ejection fraction, by estimation, is 55 to 60%. The left ventricle has normal function. The left ventricle has no regional wall motion abnormalities. There is moderate asymmetric left ventricular hypertrophy of the basal-septal segment. Left ventricular diastolic parameters are consistent with Grade I diastolic dysfunction (impaired relaxation).  2. Right ventricular systolic function is normal. The right ventricular size is normal. There is normal pulmonary artery systolic pressure.  3. Left atrial size was severely dilated.  4. The mitral valve is degenerative. Trivial mitral valve regurgitation. Moderate mitral stenosis with mean gradient 9 mmHg and MVA by VTI 1.77 cm^2. Moderate to severe mitral annular calcification. Severe mitral valvular calcification.  5. There is calcification in the LV outflow tract extending anteriorly from the mitral valve. The aortic valve is functionally bicuspid with fusion of the non- and left-coronary cusps. There is moderate calcification of the aortic valve. No aortic insufficiency. There is at least mild aortic stenosis, mean gradient 10 mmHg.  6. Aortic dilatation noted. There is mild dilatation of the ascending aorta, measuring 39 mm.  7. The inferior vena cava is normal in size with greater than 50% respiratory variability, suggesting right atrial pressure of 3 mmHg. FINDINGS  Left Ventricle: Left ventricular ejection fraction, by estimation, is 55 to 60%. The left ventricle has normal function. The left ventricle has no regional wall motion  abnormalities. The left ventricular internal cavity size was normal in size. There is  moderate asymmetric left  ventricular hypertrophy of the basal-septal segment. Left ventricular diastolic parameters are consistent with Grade I diastolic dysfunction (impaired relaxation). Right Ventricle: The right ventricular size is normal. No increase in right ventricular wall thickness. Right ventricular systolic function is normal. There is normal pulmonary artery systolic pressure. The tricuspid regurgitant velocity is 2.57 m/s, and  with an assumed right atrial pressure of 3 mmHg, the estimated right ventricular systolic pressure is 29.4 mmHg. Left Atrium: Left atrial size was severely dilated. Right Atrium: Right atrial size was normal in size. Pericardium: There is no evidence of pericardial effusion. Mitral Valve: The mitral valve is degenerative in appearance. There is severe calcification of the mitral valve leaflet(s). Moderate to severe mitral annular calcification. Trivial mitral valve regurgitation. Moderate mitral valve stenosis. MV peak gradient, 15.9 mmHg. The mean mitral valve gradient is 9.0 mmHg. Tricuspid Valve: The tricuspid valve is normal in structure. Tricuspid valve regurgitation is trivial. Aortic Valve: There is calcification in the LV outflow tract extending anteriorly from the mitral valve. The aortic valve is bicuspid. There is moderate calcification of the aortic valve. Aortic valve regurgitation is not visualized. Pulmonic Valve: The pulmonic valve was normal in structure. Pulmonic valve regurgitation is trivial. Aorta: The aortic root is normal in size and structure and aortic dilatation noted. There is mild dilatation of the ascending aorta, measuring 39 mm. Venous: The inferior vena cava is normal in size with greater than 50% respiratory variability, suggesting right atrial pressure of 3 mmHg. IAS/Shunts: No atrial level shunt detected by color flow Doppler.  LEFT VENTRICLE PLAX 2D LVIDd:         5.50 cm   Diastology LVIDs:         4.10 cm   LV e' medial:    6.31 cm/s LV PW:         1.10 cm   LV E/e'  medial:  30.9 LV IVS:        0.90 cm   LV e' lateral:   8.05 cm/s LVOT diam:     2.10 cm   LV E/e' lateral: 24.2 LV SV:         112 LV SV Index:   55 LVOT Area:     3.46 cm                           3D Volume EF:                          3D EF:        54 %                          LV EDV:       126 ml                          LV ESV:       57 ml                          LV SV:        68 ml RIGHT VENTRICLE RV Basal diam:  3.90 cm RV S prime:     8.27 cm/s TAPSE (M-mode): 2.0 cm RVSP:  29.4 mmHg LEFT ATRIUM              Index        RIGHT ATRIUM           Index LA diam:        5.80 cm  2.88 cm/m   RA Pressure: 3.00 mmHg LA Vol (A2C):   117.0 ml 58.12 ml/m  RA Area:     15.10 cm LA Vol (A4C):   101.0 ml 50.17 ml/m  RA Volume:   36.00 ml  17.88 ml/m LA Biplane Vol: 109.0 ml 54.15 ml/m  AORTIC VALVE LVOT Vmax:   131.00 cm/s LVOT Vmean:  108.000 cm/s LVOT VTI:    0.322 m  AORTA Ao Root diam: 3.50 cm Ao Asc diam:  3.90 cm MITRAL VALVE                TRICUSPID VALVE MV Area (PHT): 2.53 cm      TR Peak grad:   26.4 mmHg MV Area VTI:   1.77cm       TR Vmax:        257.00 cm/s MV Peak grad:  15.9 mmHg    Estimated RAP:  3.00 mmHg MV Mean grad:  9.0 mmHg     RVSP:           29.4 mmHg MV Vmax:       2.00 m/s MV Vmean:      136.5 cm/s   SHUNTS MV Decel Time: 244 msec     Systemic VTI:  0.32 m MV E velocity: 195.00 cm/s  Systemic Diam: 2.10 cm MV A velocity: 194.00 cm/s MV E/A ratio:  1.01 Dalton McleanMD Electronically signed by Wilfred Lacy Signature Date/Time: 06/25/2023/3:23:50 PM    Final     ASSESSMENT AND PLAN: This is a very pleasant 76 years old white male with history of iron deficiency anemia secondary to gastrointestinal blood loss. The patient is currently on over-the-counter oral iron supplement in addition to vitamin B12 orally and he is tolerating it fairly well.  Repeat CBC today showed improvement of his hemoglobin up to 10.0 and hematocrit 32.3%. I recommended for the patient to continue  with the oral iron and vitamin D supplements for now. I will see him back for follow-up visit in 2 months for evaluation and repeat blood work. He was advised to call immediately if he has any other concerning symptoms in the interval. The patient voices understanding of current disease status and treatment options and is in agreement with the current care plan.  All questions were answered. The patient knows to call the clinic with any problems, questions or concerns. We can certainly see the patient much sooner if necessary.  The total time spent in the appointment was 20 minutes.  Disclaimer: This note was dictated with voice recognition software. Similar sounding words can inadvertently be transcribed and may not be corrected upon review.

## 2023-07-15 ENCOUNTER — Ambulatory Visit: Payer: Federal, State, Local not specified - PPO | Admitting: Internal Medicine

## 2023-07-24 ENCOUNTER — Other Ambulatory Visit: Payer: Self-pay | Admitting: Family Medicine

## 2023-07-24 DIAGNOSIS — I739 Peripheral vascular disease, unspecified: Secondary | ICD-10-CM

## 2023-07-24 DIAGNOSIS — E611 Iron deficiency: Secondary | ICD-10-CM | POA: Diagnosis not present

## 2023-07-30 ENCOUNTER — Other Ambulatory Visit: Payer: Federal, State, Local not specified - PPO

## 2023-07-31 ENCOUNTER — Other Ambulatory Visit: Payer: Self-pay | Admitting: Internal Medicine

## 2023-08-07 ENCOUNTER — Other Ambulatory Visit: Payer: Federal, State, Local not specified - PPO

## 2023-08-07 DIAGNOSIS — E611 Iron deficiency: Secondary | ICD-10-CM | POA: Diagnosis not present

## 2023-08-07 DIAGNOSIS — M79671 Pain in right foot: Secondary | ICD-10-CM | POA: Diagnosis not present

## 2023-08-07 DIAGNOSIS — I5032 Chronic diastolic (congestive) heart failure: Secondary | ICD-10-CM | POA: Diagnosis not present

## 2023-08-07 DIAGNOSIS — E039 Hypothyroidism, unspecified: Secondary | ICD-10-CM | POA: Diagnosis not present

## 2023-08-07 NOTE — Progress Notes (Unsigned)
Electrophysiology Office Note:    Date:  08/08/2023   ID:  Jeremiah [name], DOB 09-15-1946, MRN [id number]  [facility] Cardiologist:  Chrystie Nose, MD  [facility] Electrophysiologist:  Will Jorja Loa, MD   Referring MD: [name], MD   Chief Complaint: Atrial fibrillation and anemia  History of Present Illness:    Jeremiah [name] is a 77 y.o. malewho I am seeing today for an evaluation of atrial fibrillation and anemia at the request of Dr. Lynnette Caffey.  The patient was last seen by Dr. Lynnette Caffey on Apr 17, 2023.  The patient has a medical history that includes severe COPD, coronary artery bypass, severe life-threatening GI bleeding, diabetes.  The patient has been hospitalized several times for severe GI bleeding.  He has had ischemic colitis and has undergone mesenteric and celiac artery stenting.  He has severe MAC.  At the time of his last appointment with Dr. Lynnette Caffey he was feeling better after receiving blood. He he is currently taking aspirin 81 mg by mouth once daily.  Today he tells me that he feels short of breath with exertion.  At times when he exerts himself his feet have a burning pain that improves with oxygen.  He does uses oxygen intermittently throughout the day and at night.  At 1 point in time recently he was able to wean off completely but he has restarted with supplemental oxygen as needed.      Their past medical, social and family history was reveiwed.   ROS:   Please see the history of present illness.    All other systems reviewed and are negative.  EKGs/Labs/Other Studies Reviewed:    The following studies were reviewed today:  June 25, 2023 echo EF 55-60 RV function normal Severely dilated left atrium Trivial MR Moderate mitral stenosis  January 2020 CT chest Severe MAC Superiorly directed left atrial appendage poorly visualized due to nongated study      Physical Exam:    VS:  BP (!) 98/58   Pulse 88   Ht 5\' 8"  (1.727 m)    Wt 204 lb 12.8 oz (92.9 kg)   SpO2 96%   BMI 31.14 kg/m     Wt Readings from Last 3 Encounters:  08/08/23 204 lb 12.8 oz (92.9 kg)  07/10/23 195 lb (88.5 kg)  06/27/23 192 lb 1.6 oz (87.1 kg)     GEN:  Well nourished, well developed in no acute distress CARDIAC: RRR, 3 out of 6 blowing holosystolic murmur and 2 out of 4 soft diastolic murmur.  No rubs or gallops. RESPIRATORY:  Clear to auscultation without rales, wheezing or rhonchi       ASSESSMENT AND PLAN:    1. PAF (paroxysmal atrial fibrillation) (HCC)   2. S/P CABG x 3   3. Nonrheumatic mitral valve stenosis     #Atrial fibrillation #GI bleeding The patient is currently off anticoagulation given history of severe GI bleeding requiring transfusion. His management has been complicated due to significant mitral valve stenosis and aortic valve stenosis.  He also has severe COPD on supplemental oxygen.  Given his significant valvular heart disease and severe lung disease, I think he is at prohibitive risk to undergo watchman implant.  I discussed this in detail with the patient.  I would recommend continuing aspirin.  Continue follow-up with structural cardiology regarding valve disease and possible future intervention.  #Coronary artery disease #CABG No ischemic symptoms today.  Continue aspirin.  Follow-up with EP on an as-needed basis  Signed, Rossie Muskrat. Lalla Brothers, MD, [facility], [facility] 08/08/2023 9:43 AM    Electrophysiology [facility]

## 2023-08-08 ENCOUNTER — Ambulatory Visit: Payer: Federal, State, Local not specified - PPO | Attending: Cardiology | Admitting: Cardiology

## 2023-08-08 ENCOUNTER — Ambulatory Visit
Admission: RE | Admit: 2023-08-08 | Discharge: 2023-08-08 | Disposition: A | Discharge status: Home / Self Care | Payer: Federal, State, Local not specified - PPO | Source: Ambulatory Visit | Attending: Family Medicine | Admitting: Family Medicine

## 2023-08-08 ENCOUNTER — Encounter: Payer: Self-pay | Admitting: Cardiology

## 2023-08-08 VITALS — BP 98/58 | HR 88 | Ht 68.0 in | Wt 204.8 lb

## 2023-08-08 DIAGNOSIS — I739 Peripheral vascular disease, unspecified: Secondary | ICD-10-CM

## 2023-08-08 DIAGNOSIS — I480 Paroxysmal atrial fibrillation: Secondary | ICD-10-CM | POA: Diagnosis not present

## 2023-08-08 DIAGNOSIS — Z951 Presence of aortocoronary bypass graft: Secondary | ICD-10-CM | POA: Diagnosis not present

## 2023-08-08 DIAGNOSIS — I342 Nonrheumatic mitral (valve) stenosis: Secondary | ICD-10-CM

## 2023-08-08 NOTE — Patient Instructions (Signed)
Medication Instructions:  Your physician recommends that you continue on your current medications as directed. Please refer to the Current Medication list given to you today.  *If you need a refill on your cardiac medications before your next appointment, please call your pharmacy*  Follow-Up: At [facility], you and your health needs are our priority.  As part of our continuing mission to provide you with exceptional heart care, we have created designated Provider Care Teams.  These Care Teams include your primary Cardiologist (physician) and Advanced Practice Providers (APPs -  Physician Assistants and Nurse Practitioners) who all work together to provide you with the care you need, when you need it.  Your next appointment:   As needed with Dr. Lambert 

## 2023-08-12 ENCOUNTER — Telehealth: Payer: Self-pay | Admitting: Internal Medicine

## 2023-08-12 NOTE — Telephone Encounter (Signed)
Chrystie Nose, MD  You20 minutes ago (1:45 PM)    Dopplers demonstrate PAD - worse with exercise. If he is having claudication, would have him see Dr. Allyson Sabal- His options are probably limited - though, maybe medical therapy. Reviewed Dr. Lovena Neighbours note - looks like he is not a candidate for Watchman.  -[place]

## 2023-08-12 NOTE — Telephone Encounter (Signed)
Patient called to follow-up on test results. 

## 2023-08-12 NOTE — Telephone Encounter (Signed)
Spoke with patient who had ABIs ordered by PCP -- he would like [name] to review  He wants [name] to know that he is not a candidate for "anything that Dr. Lalla Brothers can do"

## 2023-08-12 NOTE — Telephone Encounter (Signed)
Spoke with patient. He reports leg pain when walking about 5-10 minutes. He said he cannot walk to back of [employer] without having to stop to rest as his feet a "burning with pain". Scheduled for visit with Dr. Allyson Sabal tomorrow 9/10 @ 8:45am  Of note, he said that he [place] a [place] and will have to just ride the lift and take pics and people watch, as he is unable to actually walk the trails.

## 2023-08-13 ENCOUNTER — Encounter: Payer: Self-pay | Admitting: Cardiovascular Disease

## 2023-08-13 ENCOUNTER — Ambulatory Visit: Payer: Federal, State, Local not specified - PPO | Attending: Cardiovascular Disease | Admitting: Cardiovascular Disease

## 2023-08-13 VITALS — BP 102/50 | HR 80 | Ht 68.0 in | Wt 205.0 lb

## 2023-08-13 DIAGNOSIS — I739 Peripheral vascular disease, unspecified: Secondary | ICD-10-CM | POA: Insufficient documentation

## 2023-08-13 NOTE — Progress Notes (Signed)
08/13/2023 Rich Fuchs   06-07-1946  [id number]  Primary Physician Koirala, Dibas, MD Primary Cardiologist: Runell Gess MD Nicholes Calamity, [place]  HPI:  Jeremiah [name] is a 77 y.o. moderately overweight married Caucasian male with no children referred by [name] for peripheral vascular evaluation because of lifestyle-limiting claudication.  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  He has a history of CAD status post myocardial infarction in 2006 and subsequent CABG with LIMA to his LAD, vein to OM1 and 20 PDA.  He is also had PSVT ablation.  Symptoms include obstructive sleep apnea, treated hypertension, diabetes and hyperlipidemia.  He smoked remotely.  He does complain of lifestyle-limiting claudication and had recent Doppler studies revealing ABIs that significantly dropped with exercise.   Current Meds  Medication Sig   aspirin EC 81 MG tablet Take 1 tablet (81 mg total) by mouth daily. Swallow whole.   atorvastatin (LIPITOR) 80 MG tablet Take 80 mg by mouth daily.   budesonide (PULMICORT) 0.5 MG/2ML nebulizer solution Take 2 mLs (0.5 mg total) by nebulization 2 (two) times daily.   cetirizine (ZYRTEC) 10 MG tablet Take 10 mg by mouth daily as needed for allergies.   colchicine 0.6 MG tablet Take 0.6 mg by mouth daily as needed (Gout).   dexlansoprazole (DEXILANT) 60 MG capsule Take 60 mg by mouth daily.   dicyclomine (BENTYL) 10 MG capsule Take 10 mg by mouth daily as needed for spasms.   ezetimibe (ZETIA) 10 MG tablet TAKE 1 TABLET(10 MG) BY MOUTH DAILY   FEROSUL 325 (65 Fe) MG tablet TAKE 1 TABLET BY MOUTH DAILY WITH BREAKFAST   fluticasone (FLONASE) 50 MCG/ACT nasal spray Place 1 spray into both nostrils daily.   furosemide (LASIX) 40 MG tablet Take 2 tablets (80 mg total) by mouth daily.   glimepiride (AMARYL) 4 MG tablet Take 4 mg by mouth daily.   levothyroxine (SYNTHROID, LEVOTHROID) 88 MCG tablet Take 88 mcg by mouth daily before breakfast.    metoCLOPramide (REGLAN) 5 MG tablet Take 5 mg by mouth as needed for nausea.   potassium chloride SA (KLOR-CON M) 20 MEQ tablet Take 1 tablet (20 mEq total) by mouth daily.   predniSONE (DELTASONE) 50 MG tablet Take 50 mg by mouth as needed.   spironolactone (ALDACTONE) 25 MG tablet Take 1 tablet (25 mg total) by mouth daily.     Allergies  Allergen Reactions   Shellfish Allergy Shortness Of Breath, Nausea And Vomiting and Rash    Scallops     Fluzone [Influenza Virus Vaccine] Nausea And Vomiting, Palpitations and Rash    Social History   Socioeconomic History   Marital status: Married    Spouse name: Not on file   Number of children: 0   Years of education: Not on file   Highest education level: Not on file  Occupational History   Occupation: retired - direct [employer]  Tobacco Use   Smoking status: Former    Current packs/day: 0.00    Average packs/day: 0.8 packs/day for 60.0 years (45.0 ttl pk-yrs)    Types: Cigarettes    Start date: 12/15/1956    Quit date: 12/15/2016    Years since quitting: 6.6   Smokeless tobacco: Never  Vaping Use   Vaping status: Never Used  Substance and Sexual Activity   Alcohol use: Not Currently    Comment: very seldom   Drug use: No   Sexual activity: Not on file  Other  Topics Concern   Not on file  Social History Narrative   Retired, married, no children      epworth sleepiness scale = 9 (04/13/16) (has OSA)   Social Determinants of Health   Financial Resource Strain: Not on file  Food Insecurity: No Food Insecurity (06/05/2023)   Hunger Vital Sign    Worried About Running Out of Food in the Last Year: Never true    Ran Out of Food in the Last Year: Never true  Transportation Needs: No Transportation Needs (06/05/2023)   PRAPARE - Administrator, Civil Service (Medical): No    Lack of Transportation (Non-Medical): No  Physical Activity: Not on file  Stress: Not on file  Social Connections: Not on file  Intimate Partner  Violence: Not At Risk (06/05/2023)   Humiliation, Afraid, Rape, and Kick questionnaire    Fear of Current or Ex-Partner: No    Emotionally Abused: No    Physically Abused: No    Sexually Abused: No     Review of Systems: General: negative for chills, fever, night sweats or weight changes.  Cardiovascular: negative for chest pain, dyspnea on exertion, edema, orthopnea, palpitations, paroxysmal nocturnal dyspnea or shortness of breath Dermatological: negative for rash Respiratory: negative for cough or wheezing Urologic: negative for hematuria Abdominal: negative for nausea, vomiting, diarrhea, bright red blood per rectum, melena, or hematemesis Neurologic: negative for visual changes, syncope, or dizziness All other systems reviewed and are otherwise negative except as noted above.    Blood pressure (!) 102/50, pulse 80, height 5\' 8"  (1.727 m), weight 205 lb (93 kg), SpO2 97%.  General appearance: alert and no distress Neck: no adenopathy, no carotid bruit, no JVD, supple, symmetrical, trachea midline, and thyroid not enlarged, symmetric, no tenderness/mass/nodules Lungs: clear to auscultation bilaterally Heart: Soft outflow tract murmur consistent with aortic stenosis and/or sclerosis Extremities: extremities normal, atraumatic, no cyanosis or edema Pulses: 2+ and symmetric Skin: Skin color, texture, turgor normal. No rashes or lesions Neurologic: Grossly normal  EKG not performed today      ASSESSMENT AND PLAN:   Peripheral arterial disease [facility]) Mr. Stjean was referred to me by [name] for evaluation of PAD.  He did have Doppler studies ordered by Dr.Koirala because of lifestyle-limiting claudication.  These were performed 08/08/2023 revealing a resting right ABI of 0.19 following to 0.14 with exercise and resting left ABI 0.86 drawn today with exercise.  He is functionally limited and wishes to proceed with further evaluation and potential endovascular therapy.     Runell Gess MD FACP,FACC,FAHA, [facility] 08/13/2023 9:31 AM

## 2023-08-13 NOTE — Assessment & Plan Note (Signed)
Jeremiah [name] was referred to me by [name] for evaluation of PAD.  He did have Doppler studies ordered by Dr.Koirala because of lifestyle-limiting claudication.  These were performed 08/08/2023 revealing a resting right ABI of 0.19 following to 0.14 with exercise and resting left ABI 0.86 drawn today with exercise.  He is functionally limited and wishes to proceed with further evaluation and potential endovascular therapy.

## 2023-08-13 NOTE — Patient Instructions (Signed)
Medication Instructions:  Your physician recommends that you continue on your current medications as directed. Please refer to the Current Medication list given to you today.  *If you need a refill on your cardiac medications before your next appointment, please call your pharmacy*    Testing/Procedures: Your physician has requested that you have a lower extremity arterial duplex. During this test, ultrasound is used to evaluate arterial blood flow in the legs. Allow one hour for this exam. There are no restrictions or special instructions. This will take place at [address], [address].  Your physician has requested that you have an ankle brachial index (ABI). During this test an ultrasound and blood pressure cuff are used to evaluate the arteries that supply the arms and legs with blood. Allow thirty minutes for this exam. There are no restrictions or special instructions. This will take place at [address], [address].      Follow-Up: At [facility], you and your health needs are our priority.  As part of our continuing mission to provide you with exceptional heart care, we have created designated Provider Care Teams.  These Care Teams include your primary Cardiologist (physician) and Advanced Practice Providers (APPs -  Physician Assistants and Nurse Practitioners) who all work together to provide you with the care you need, when you need it.  We recommend signing up for the patient portal called "MyChart".  Sign up information is provided on this After Visit Summary.  MyChart is used to connect with patients for Virtual Visits (Telemedicine).  Patients are able to view lab/test results, encounter notes, upcoming appointments, etc.  Non-urgent messages can be sent to your provider as well.   To learn more about what you can do with MyChart, go to [email or web address].    Your next appointment:   3-4 week(s)  Provider:   Nanetta Batty, MD

## 2023-08-20 ENCOUNTER — Other Ambulatory Visit: Payer: Self-pay | Admitting: Medical Oncology

## 2023-08-20 ENCOUNTER — Telehealth: Payer: Self-pay | Admitting: Medical Oncology

## 2023-08-20 DIAGNOSIS — D649 Anemia, unspecified: Secondary | ICD-10-CM

## 2023-08-20 NOTE — Telephone Encounter (Signed)
Weak- "not feeling good at all" .  He said that he feels weak and has been on the couch all day.  Asking if he needs to get his labs checked. Labs and f/u 10/08.

## 2023-08-21 ENCOUNTER — Telehealth: Payer: Self-pay | Admitting: Medical Oncology

## 2023-08-21 DIAGNOSIS — E611 Iron deficiency: Secondary | ICD-10-CM | POA: Diagnosis not present

## 2023-08-21 DIAGNOSIS — D500 Iron deficiency anemia secondary to blood loss (chronic): Secondary | ICD-10-CM | POA: Diagnosis not present

## 2023-08-21 DIAGNOSIS — R0602 Shortness of breath: Secondary | ICD-10-CM | POA: Diagnosis not present

## 2023-08-21 NOTE — Telephone Encounter (Signed)
Pt will ask [facility] to fax his labs drawn today .

## 2023-08-22 ENCOUNTER — Emergency Department (HOSPITAL_COMMUNITY): Payer: Federal, State, Local not specified - PPO

## 2023-08-22 ENCOUNTER — Encounter (HOSPITAL_COMMUNITY): Payer: Self-pay | Admitting: Emergency Medicine

## 2023-08-22 ENCOUNTER — Inpatient Hospital Stay (HOSPITAL_COMMUNITY)
Admission: EM | Admit: 2023-08-22 | Discharge: 2023-08-24 | DRG: 242 | Disposition: A | Discharge status: Home / Self Care | Payer: Federal, State, Local not specified - PPO | Attending: Internal Medicine | Admitting: Internal Medicine

## 2023-08-22 ENCOUNTER — Other Ambulatory Visit: Payer: Self-pay

## 2023-08-22 DIAGNOSIS — Z85038 Personal history of other malignant neoplasm of large intestine: Secondary | ICD-10-CM | POA: Diagnosis not present

## 2023-08-22 DIAGNOSIS — J449 Chronic obstructive pulmonary disease, unspecified: Secondary | ICD-10-CM | POA: Diagnosis not present

## 2023-08-22 DIAGNOSIS — Z7982 Long term (current) use of aspirin: Secondary | ICD-10-CM

## 2023-08-22 DIAGNOSIS — Z7951 Long term (current) use of inhaled steroids: Secondary | ICD-10-CM

## 2023-08-22 DIAGNOSIS — E7800 Pure hypercholesterolemia, unspecified: Secondary | ICD-10-CM | POA: Diagnosis present

## 2023-08-22 DIAGNOSIS — I083 Combined rheumatic disorders of mitral, aortic and tricuspid valves: Secondary | ICD-10-CM | POA: Diagnosis not present

## 2023-08-22 DIAGNOSIS — Z9981 Dependence on supplemental oxygen: Secondary | ICD-10-CM | POA: Diagnosis not present

## 2023-08-22 DIAGNOSIS — Z951 Presence of aortocoronary bypass graft: Secondary | ICD-10-CM

## 2023-08-22 DIAGNOSIS — G894 Chronic pain syndrome: Secondary | ICD-10-CM | POA: Diagnosis present

## 2023-08-22 DIAGNOSIS — I350 Nonrheumatic aortic (valve) stenosis: Secondary | ICD-10-CM

## 2023-08-22 DIAGNOSIS — G9349 Other encephalopathy: Secondary | ICD-10-CM | POA: Diagnosis present

## 2023-08-22 DIAGNOSIS — R570 Cardiogenic shock: Secondary | ICD-10-CM | POA: Insufficient documentation

## 2023-08-22 DIAGNOSIS — Z833 Family history of diabetes mellitus: Secondary | ICD-10-CM

## 2023-08-22 DIAGNOSIS — Z87891 Personal history of nicotine dependence: Secondary | ICD-10-CM

## 2023-08-22 DIAGNOSIS — D649 Anemia, unspecified: Secondary | ICD-10-CM | POA: Diagnosis present

## 2023-08-22 DIAGNOSIS — Z818 Family history of other mental and behavioral disorders: Secondary | ICD-10-CM

## 2023-08-22 DIAGNOSIS — I5043 Acute on chronic combined systolic (congestive) and diastolic (congestive) heart failure: Secondary | ICD-10-CM | POA: Diagnosis not present

## 2023-08-22 DIAGNOSIS — I5033 Acute on chronic diastolic (congestive) heart failure: Secondary | ICD-10-CM | POA: Diagnosis present

## 2023-08-22 DIAGNOSIS — E1151 Type 2 diabetes mellitus with diabetic peripheral angiopathy without gangrene: Secondary | ICD-10-CM | POA: Diagnosis not present

## 2023-08-22 DIAGNOSIS — E876 Hypokalemia: Secondary | ICD-10-CM | POA: Diagnosis present

## 2023-08-22 DIAGNOSIS — Z950 Presence of cardiac pacemaker: Secondary | ICD-10-CM

## 2023-08-22 DIAGNOSIS — I110 Hypertensive heart disease with heart failure: Secondary | ICD-10-CM | POA: Diagnosis present

## 2023-08-22 DIAGNOSIS — Z6831 Body mass index (BMI) 31.0-31.9, adult: Secondary | ICD-10-CM

## 2023-08-22 DIAGNOSIS — E1165 Type 2 diabetes mellitus with hyperglycemia: Secondary | ICD-10-CM | POA: Diagnosis present

## 2023-08-22 DIAGNOSIS — N179 Acute kidney failure, unspecified: Secondary | ICD-10-CM | POA: Diagnosis present

## 2023-08-22 DIAGNOSIS — Z79899 Other long term (current) drug therapy: Secondary | ICD-10-CM | POA: Diagnosis not present

## 2023-08-22 DIAGNOSIS — I4891 Unspecified atrial fibrillation: Secondary | ICD-10-CM | POA: Diagnosis present

## 2023-08-22 DIAGNOSIS — J9611 Chronic respiratory failure with hypoxia: Secondary | ICD-10-CM | POA: Diagnosis not present

## 2023-08-22 DIAGNOSIS — G4733 Obstructive sleep apnea (adult) (pediatric): Secondary | ICD-10-CM | POA: Diagnosis present

## 2023-08-22 DIAGNOSIS — I442 Atrioventricular block, complete: Principal | ICD-10-CM | POA: Diagnosis present

## 2023-08-22 DIAGNOSIS — Z86718 Personal history of other venous thrombosis and embolism: Secondary | ICD-10-CM

## 2023-08-22 DIAGNOSIS — I2510 Atherosclerotic heart disease of native coronary artery without angina pectoris: Secondary | ICD-10-CM | POA: Diagnosis present

## 2023-08-22 DIAGNOSIS — E669 Obesity, unspecified: Secondary | ICD-10-CM | POA: Diagnosis not present

## 2023-08-22 DIAGNOSIS — R918 Other nonspecific abnormal finding of lung field: Secondary | ICD-10-CM | POA: Diagnosis not present

## 2023-08-22 DIAGNOSIS — Z8546 Personal history of malignant neoplasm of prostate: Secondary | ICD-10-CM

## 2023-08-22 DIAGNOSIS — R0602 Shortness of breath: Secondary | ICD-10-CM | POA: Diagnosis not present

## 2023-08-22 DIAGNOSIS — Z91013 Allergy to seafood: Secondary | ICD-10-CM

## 2023-08-22 DIAGNOSIS — I25810 Atherosclerosis of coronary artery bypass graft(s) without angina pectoris: Secondary | ICD-10-CM | POA: Diagnosis present

## 2023-08-22 DIAGNOSIS — I050 Rheumatic mitral stenosis: Secondary | ICD-10-CM | POA: Diagnosis present

## 2023-08-22 DIAGNOSIS — Z7984 Long term (current) use of oral hypoglycemic drugs: Secondary | ICD-10-CM

## 2023-08-22 DIAGNOSIS — R001 Bradycardia, unspecified: Secondary | ICD-10-CM | POA: Diagnosis present

## 2023-08-22 DIAGNOSIS — J90 Pleural effusion, not elsewhere classified: Secondary | ICD-10-CM | POA: Diagnosis not present

## 2023-08-22 DIAGNOSIS — E039 Hypothyroidism, unspecified: Secondary | ICD-10-CM | POA: Diagnosis present

## 2023-08-22 DIAGNOSIS — I10 Essential (primary) hypertension: Secondary | ICD-10-CM | POA: Diagnosis present

## 2023-08-22 DIAGNOSIS — Z79890 Hormone replacement therapy (postmenopausal): Secondary | ICD-10-CM | POA: Diagnosis not present

## 2023-08-22 DIAGNOSIS — I252 Old myocardial infarction: Secondary | ICD-10-CM

## 2023-08-22 DIAGNOSIS — I459 Conduction disorder, unspecified: Secondary | ICD-10-CM | POA: Diagnosis present

## 2023-08-22 DIAGNOSIS — Z887 Allergy status to serum and vaccine status: Secondary | ICD-10-CM

## 2023-08-22 DIAGNOSIS — Z955 Presence of coronary angioplasty implant and graft: Secondary | ICD-10-CM

## 2023-08-22 LAB — COMPREHENSIVE METABOLIC PANEL
ALT: 16 U/L (ref 0–44)
AST: 19 U/L (ref 15–41)
Albumin: 3.6 g/dL (ref 3.5–5.0)
Alkaline Phosphatase: 54 U/L (ref 38–126)
Anion gap: 13 (ref 5–15)
BUN: 38 mg/dL — ABNORMAL HIGH (ref 8–23)
CO2: 23 mmol/L (ref 22–32)
Calcium: 9.1 mg/dL (ref 8.9–10.3)
Chloride: 99 mmol/L (ref 98–111)
Creatinine, Ser: 1.58 mg/dL — ABNORMAL HIGH (ref 0.61–1.24)
GFR, Estimated: 45 mL/min — ABNORMAL LOW (ref >60)
Glucose, Bld: 154 mg/dL — ABNORMAL HIGH (ref 70–99)
Potassium: 4.2 mmol/L (ref 3.5–5.1)
Sodium: 135 mmol/L (ref 135–145)
Total Bilirubin: 1.3 mg/dL — ABNORMAL HIGH (ref 0.3–1.2)
Total Protein: 7.3 g/dL (ref 6.5–8.1)

## 2023-08-22 LAB — CBC WITH DIFFERENTIAL/PLATELET
Abs Immature Granulocytes: 0.05 10*3/uL (ref 0.00–0.07)
Basophils Absolute: 0 10*3/uL (ref 0.0–0.1)
Basophils Relative: 0 %
Eosinophils Absolute: 0 10*3/uL (ref 0.0–0.5)
Eosinophils Relative: 0 %
HCT: 32 % — ABNORMAL LOW (ref 39.0–52.0)
Hemoglobin: 9.8 g/dL — ABNORMAL LOW (ref 13.0–17.0)
Immature Granulocytes: 1 %
Lymphocytes Relative: 14 %
Lymphs Abs: 1 10*3/uL (ref 0.7–4.0)
MCH: 29.4 pg (ref 26.0–34.0)
MCHC: 30.6 g/dL (ref 30.0–36.0)
MCV: 96.1 fL (ref 80.0–100.0)
Monocytes Absolute: 0.9 10*3/uL (ref 0.1–1.0)
Monocytes Relative: 12 %
Neutro Abs: 5.5 10*3/uL (ref 1.7–7.7)
Neutrophils Relative %: 73 %
Platelets: 170 10*3/uL (ref 150–400)
RBC: 3.33 MIL/uL — ABNORMAL LOW (ref 4.22–5.81)
RDW: 16.2 % — ABNORMAL HIGH (ref 11.5–15.5)
WBC: 7.5 10*3/uL (ref 4.0–10.5)
nRBC: 0 % (ref 0.0–0.2)

## 2023-08-22 LAB — CBC
HCT: 29.8 % — ABNORMAL LOW (ref 39.0–52.0)
HCT: 29.4 % — ABNORMAL LOW (ref 39.0–52.0)
Hemoglobin: 9.4 g/dL — ABNORMAL LOW (ref 13.0–17.0)
Hemoglobin: 9.4 g/dL — ABNORMAL LOW (ref 13.0–17.0)
MCH: 30.6 pg (ref 26.0–34.0)
MCH: 30.6 pg (ref 26.0–34.0)
MCHC: 31.5 g/dL (ref 30.0–36.0)
MCHC: 32 g/dL (ref 30.0–36.0)
MCV: 97.1 fL (ref 80.0–100.0)
MCV: 95.8 fL (ref 80.0–100.0)
Platelets: 156 10*3/uL (ref 150–400)
Platelets: 141 10*3/uL — ABNORMAL LOW (ref 150–400)
RBC: 3.07 MIL/uL — ABNORMAL LOW (ref 4.22–5.81)
RBC: 3.07 MIL/uL — ABNORMAL LOW (ref 4.22–5.81)
RDW: 16.5 % — ABNORMAL HIGH (ref 11.5–15.5)
RDW: 16.3 % — ABNORMAL HIGH (ref 11.5–15.5)
WBC: 7.9 10*3/uL (ref 4.0–10.5)
WBC: 7.5 10*3/uL (ref 4.0–10.5)
nRBC: 0 % (ref 0.0–0.2)
nRBC: 0 % (ref 0.0–0.2)

## 2023-08-22 LAB — MRSA NEXT GEN BY PCR, NASAL: MRSA by PCR Next Gen: NOT DETECTED

## 2023-08-22 LAB — I-STAT CG4 LACTIC ACID, ED: Lactic Acid, Venous: 1.5 mmol/L (ref 0.5–1.9)

## 2023-08-22 LAB — GLUCOSE, CAPILLARY
Glucose-Capillary: 148 mg/dL — ABNORMAL HIGH (ref 70–99)
Glucose-Capillary: 154 mg/dL — ABNORMAL HIGH (ref 70–99)
Glucose-Capillary: 163 mg/dL — ABNORMAL HIGH (ref 70–99)
Glucose-Capillary: 164 mg/dL — ABNORMAL HIGH (ref 70–99)
Glucose-Capillary: 167 mg/dL — ABNORMAL HIGH (ref 70–99)

## 2023-08-22 LAB — HEMOGLOBIN A1C
Hgb A1c MFr Bld: 5.9 % — ABNORMAL HIGH (ref 4.8–5.6)
Mean Plasma Glucose: 122.63 mg/dL

## 2023-08-22 LAB — CREATININE, SERUM
Creatinine, Ser: 1.43 mg/dL — ABNORMAL HIGH (ref 0.61–1.24)
GFR, Estimated: 51 mL/min — ABNORMAL LOW (ref >60)

## 2023-08-22 LAB — BRAIN NATRIURETIC PEPTIDE: B Natriuretic Peptide: 898.5 pg/mL — ABNORMAL HIGH (ref 0.0–100.0)

## 2023-08-22 LAB — SURGICAL PCR SCREEN
MRSA, PCR: NEGATIVE
Staphylococcus aureus: NEGATIVE

## 2023-08-22 LAB — TROPONIN I (HIGH SENSITIVITY)
Troponin I (High Sensitivity): 41 ng/L — ABNORMAL HIGH (ref <18)
Troponin I (High Sensitivity): 55 ng/L — ABNORMAL HIGH (ref <18)

## 2023-08-22 MED ORDER — SODIUM CHLORIDE 0.9 % IV SOLN
80.0000 mg | INTRAVENOUS | Status: AC
  Administered 2023-08-23: 80 mg
  Filled 2023-08-22: qty 2

## 2023-08-22 MED ORDER — SODIUM CHLORIDE 0.9% FLUSH
3.0000 mL | Freq: Two times a day (BID) | INTRAVENOUS | Status: DC
  Administered 2023-08-22 – 2023-08-24 (×4): 3 mL via INTRAVENOUS

## 2023-08-22 MED ORDER — CEFAZOLIN SODIUM-DEXTROSE 2-4 GM/100ML-% IV SOLN
2.0000 g | INTRAVENOUS | Status: AC
  Administered 2023-08-23: 2 g via INTRAVENOUS
  Filled 2023-08-22: qty 100

## 2023-08-22 MED ORDER — FLUTICASONE PROPIONATE 50 MCG/ACT NA SUSP
1.0000 | Freq: Every day | NASAL | Status: DC
  Administered 2023-08-23 – 2023-08-24 (×2): 1 via NASAL
  Filled 2023-08-22 (×2): qty 16

## 2023-08-22 MED ORDER — REVEFENACIN 175 MCG/3ML IN SOLN
175.0000 ug | Freq: Every day | RESPIRATORY_TRACT | Status: DC
  Administered 2023-08-23: 175 ug via RESPIRATORY_TRACT
  Filled 2023-08-22 (×2): qty 3

## 2023-08-22 MED ORDER — SODIUM CHLORIDE 0.9% FLUSH: 3.0000 mL | INTRAVENOUS | Status: DC | PRN

## 2023-08-22 MED ORDER — SODIUM CHLORIDE 0.9 % IV SOLN: INTRAVENOUS | Status: DC

## 2023-08-22 MED ORDER — POLYETHYLENE GLYCOL 3350 17 G PO PACK: 17.0000 g | PACK | Freq: Every day | ORAL | Status: DC | PRN

## 2023-08-22 MED ORDER — LEVOTHYROXINE SODIUM 88 MCG PO TABS
88.0000 ug | ORAL_TABLET | Freq: Every day | ORAL | Status: DC
  Administered 2023-08-23 – 2023-08-24 (×2): 88 ug via ORAL
  Filled 2023-08-22 (×2): qty 1

## 2023-08-22 MED ORDER — ACETAMINOPHEN 325 MG PO TABS
650.0000 mg | ORAL_TABLET | ORAL | Status: DC | PRN
  Administered 2023-08-23 – 2023-08-24 (×4): 650 mg via ORAL
  Filled 2023-08-22 (×4): qty 2

## 2023-08-22 MED ORDER — DOPAMINE-DEXTROSE 3.2-5 MG/ML-% IV SOLN
0.0000 ug/kg/min | INTRAVENOUS | Status: DC
  Administered 2023-08-22: 5 ug/kg/min via INTRAVENOUS
  Administered 2023-08-23: 7.5 ug/kg/min via INTRAVENOUS
  Filled 2023-08-22 (×2): qty 250

## 2023-08-22 MED ORDER — CHLORHEXIDINE GLUCONATE 4 % EX SOLN
60.0000 mL | Freq: Once | CUTANEOUS | Status: AC
  Administered 2023-08-23: 4 via TOPICAL
  Filled 2023-08-22: qty 60

## 2023-08-22 MED ORDER — SODIUM CHLORIDE 0.9 % IV SOLN: 250.0000 mL | INTRAVENOUS | Status: DC

## 2023-08-22 MED ORDER — BUDESONIDE 0.5 MG/2ML IN SUSP
0.5000 mg | Freq: Two times a day (BID) | RESPIRATORY_TRACT | Status: DC
  Administered 2023-08-22 – 2023-08-24 (×4): 0.5 mg via RESPIRATORY_TRACT
  Filled 2023-08-22 (×4): qty 2

## 2023-08-22 MED ORDER — SODIUM CHLORIDE 0.9% FLUSH
3.0000 mL | Freq: Two times a day (BID) | INTRAVENOUS | Status: DC
  Administered 2023-08-22 – 2023-08-24 (×3): 3 mL via INTRAVENOUS

## 2023-08-22 MED ORDER — DICYCLOMINE HCL 10 MG PO CAPS: 10.0000 mg | ORAL_CAPSULE | Freq: Every day | ORAL | Status: DC | PRN

## 2023-08-22 MED ORDER — FERROUS SULFATE 325 (65 FE) MG PO TABS
325.0000 mg | ORAL_TABLET | Freq: Every day | ORAL | Status: DC
  Administered 2023-08-23 – 2023-08-24 (×2): 325 mg via ORAL
  Filled 2023-08-22 (×2): qty 1

## 2023-08-22 MED ORDER — SPIRONOLACTONE 25 MG PO TABS
25.0000 mg | ORAL_TABLET | Freq: Every day | ORAL | Status: DC
  Administered 2023-08-22 – 2023-08-24 (×3): 25 mg via ORAL
  Filled 2023-08-22 (×3): qty 1

## 2023-08-22 MED ORDER — INSULIN ASPART 100 UNIT/ML IJ SOLN
0.0000 [IU] | INTRAMUSCULAR | Status: DC
  Administered 2023-08-22: 2 [IU] via SUBCUTANEOUS
  Administered 2023-08-22: 3 [IU] via SUBCUTANEOUS
  Administered 2023-08-22 – 2023-08-23 (×2): 2 [IU] via SUBCUTANEOUS
  Administered 2023-08-23 (×2): 3 [IU] via SUBCUTANEOUS

## 2023-08-22 MED ORDER — EZETIMIBE 10 MG PO TABS
10.0000 mg | ORAL_TABLET | Freq: Every day | ORAL | Status: DC
  Administered 2023-08-22 – 2023-08-24 (×3): 10 mg via ORAL
  Filled 2023-08-22 (×3): qty 1

## 2023-08-22 MED ORDER — SODIUM CHLORIDE 0.9 % IV SOLN: 250.0000 mL | INTRAVENOUS | Status: DC | PRN

## 2023-08-22 MED ORDER — CHLORHEXIDINE GLUCONATE 4 % EX SOLN
60.0000 mL | Freq: Once | CUTANEOUS | Status: AC
  Administered 2023-08-22: 4 via TOPICAL

## 2023-08-22 MED ORDER — FUROSEMIDE 10 MG/ML IJ SOLN
40.0000 mg | Freq: Two times a day (BID) | INTRAMUSCULAR | Status: DC
  Administered 2023-08-22 – 2023-08-24 (×3): 40 mg via INTRAVENOUS
  Filled 2023-08-22 (×3): qty 4

## 2023-08-22 MED ORDER — HEPARIN SODIUM (PORCINE) 5000 UNIT/ML IJ SOLN
5000.0000 [IU] | Freq: Three times a day (TID) | INTRAMUSCULAR | Status: DC
  Administered 2023-08-22 – 2023-08-23 (×3): 5000 [IU] via SUBCUTANEOUS
  Filled 2023-08-22 (×3): qty 1

## 2023-08-22 MED ORDER — CHLORHEXIDINE GLUCONATE CLOTH 2 % EX PADS
6.0000 | MEDICATED_PAD | Freq: Every day | CUTANEOUS | Status: DC
  Administered 2023-08-23 – 2023-08-24 (×2): 6 via TOPICAL

## 2023-08-22 MED ORDER — DOCUSATE SODIUM 100 MG PO CAPS: 100.0000 mg | ORAL_CAPSULE | Freq: Two times a day (BID) | ORAL | Status: DC | PRN

## 2023-08-22 MED ORDER — PANTOPRAZOLE SODIUM 40 MG PO TBEC
40.0000 mg | DELAYED_RELEASE_TABLET | Freq: Every day | ORAL | Status: DC
  Administered 2023-08-22 – 2023-08-24 (×3): 40 mg via ORAL
  Filled 2023-08-22 (×3): qty 1

## 2023-08-22 MED ORDER — ASPIRIN 81 MG PO TBEC
81.0000 mg | DELAYED_RELEASE_TABLET | Freq: Every day | ORAL | Status: DC
  Administered 2023-08-22 – 2023-08-24 (×3): 81 mg via ORAL
  Filled 2023-08-22 (×3): qty 1

## 2023-08-22 NOTE — H&P (Addendum)
ADMISSION HISTORY & PHYSICAL  Patient Name: Jeremiah [name] Date of Encounter: 08/22/2023 Primary Care Physician: [name], MD Cardiologist: Chrystie Nose, MD  Chief Complaint   Shortness of breath, fatigue  Patient Profile   77 yo male with history of CAD status post three-vessel CABG in 2006, COPD, PAD, recurrent SVT status post ablation, new onset atrial fibrillation, moderate aortic stenosis and severe mitral stenosis, not anticoagulated due to recent GI bleeding and not considered a candidate for watchman, now presents with progressive dyspnea and fatigue and found to be in complete heart block.  HPI   This is a 77 y.o. male with a past medical history significant for CAD status post three-vessel CABG in 2006, COPD, PAD, recurrent SVT status post ablation, new onset atrial fibrillation, moderate aortic stenosis and severe mitral stenosis, not anticoagulated due to recent GI bleeding and not considered a candidate for watchman, now presents with progressive dyspnea and fatigue and found to be in complete heart block.  Jeremiah [name] has had multiple recent medical problems including symptomatic PAD.  He was undergoing workup for this and he may have a possible intervention with Dr. Allyson Sabal.  Over the past 4 to 5 days he has had progressive shortness of breath and fatigue.  He has required using his oxygen on a daily basis.  He saw his primary care provider yesterday and tested negative for COVID however was noted to be bradycardic with heart rates in the 30s.  He was advised to come to the emergency department.  While here he was noted to be in complete heart block.  He reports not having taken his medications today -he is not noted to be on any AV nodal blocking agents.  On presentation he was short of breath.  Chest x-ray shows possible trace pleural effusion.  BNP was elevated at 898.5.  He has had progressive cough and symptoms consistent with heart failure.  His baseline BNP is about  270.  Initial troponin mildly elevated at 41 (from 27 - 2 months ago).  PMHx   Past Medical History:  Diagnosis Date   A-fib [facility])    Acute on chronic systolic (congestive) heart failure (HCC) 06/10/2017   Acute pulmonary edema (HCC)    Anemia    Aortic valve sclerosis 12/2018   Noted on ECHO   Arthritis    CAD (coronary artery disease)    a. 2006: CABG in 2006 with LIMA-LAD, SVG-OM1, and SVG-RPDA   Cardiomegaly 12/2018   Stable, noted on CXR   Carpal tunnel syndrome    Right   Chronic pain 03/21/2016   Colon cancer (HCC) 2006   COPD (chronic obstructive pulmonary disease) (HCC)    Diabetes mellitus without complication (HCC)    DVT (deep venous thrombosis) (HCC)    Right   Essential hypertension 03/21/2016   GERD (gastroesophageal reflux disease)    History of blood transfusion    History of Clostridioides difficile infection    History of prostate cancer 03/21/2016   History of PSVT (paroxysmal supraventricular tachycardia)    HLD (hyperlipidemia)    HTN (hypertension)    Hx of CABG 2006   Hypercholesteremia 03/21/2016   Hypothyroidism    LAE (left atrial enlargement) 12/2018   Severe, Noted on ECHO   LVH (left ventricular hypertrophy) 12/2018   Mild, Noted on ECHO   Medication management 03/21/2016   Mitral annular calcification 12/2018   with mild MS, Noted on ECHO   Morbid obesity (HCC) 03/21/2016  Myocardial infarct (HCC)    OSA (obstructive sleep apnea) 03/21/2016   uses oxygen at night time   Pain in right ankle and joints of right foot 03/21/2016   Paronychia 03/21/2016   Phimosis    Pneumonia    Primary insomnia 03/21/2016   Prostate cancer (HCC) 2008   Pulmonary hypertension (HCC) 12/2018   Moderate, Noted on ECHO   Tobacco dependence 03/21/2016   Tricuspid regurgitation 12/2018   Mild, Noted on ECHO    Past Surgical History:  Procedure Laterality Date   ANKLE SURGERY Right 12/2013   CARDIAC CATHETERIZATION N/A 05/08/2016   Procedure: Left  Heart Cath and Cors/Grafts Angiography;  Surgeon: Lyn Records, MD;  Location: [facility] INVASIVE CV LAB;  Service: Cardiovascular;  Laterality: N/A;   CIRCUMCISION N/A 10/07/2019   Procedure: CIRCUMCISION ADULT;  Surgeon: Crist Fat, MD;  Location: [facility] ORS;  Service: Urology;  Laterality: N/A;   CORONARY ARTERY BYPASS GRAFT  2006   x3   ELECTROPHYSIOLOGIC STUDY N/A 08/24/2016   Procedure: SVT Ablation;  Surgeon: Will Jorja Loa, MD;  Location: [facility] INVASIVE CV LAB;  Service: Cardiovascular;  Laterality: N/A;   PROSTATE SURGERY  2008   PTCA     UMBILICAL HERNIA REPAIR N/A 05/26/2022   Procedure: PRIMARY REPAIR OF STRANGULATED UMBILICAL HERNIA WITH PARTIAL OMENTECTOMY;  Surgeon: Almond Lint, MD;  Location: [facility] OR;  Service: General;  Laterality: N/A;    FAMHx   Family History  Problem Relation Age of Onset   Dementia Mother    Diabetes Sister     SOCHx    reports that he quit smoking about 6 years ago. His smoking use included cigarettes. He started smoking about 66 years ago. He has a 45 pack-year smoking history. He has never used smokeless tobacco. He reports that he does not currently use alcohol. He reports that he does not use drugs.  Outpatient Medications   No current facility-administered medications on file prior to encounter.   Current Outpatient Medications on File Prior to Encounter  Medication Sig Dispense Refill   aspirin EC 81 MG tablet Take 1 tablet (81 mg total) by mouth daily. Swallow whole. 90 tablet 3   budesonide (PULMICORT) 0.5 MG/2ML nebulizer solution Take 2 mLs (0.5 mg total) by nebulization 2 (two) times daily. 120 mL 11   colchicine 0.6 MG tablet Take 0.6 mg by mouth daily as needed (Gout).     dexlansoprazole (DEXILANT) 60 MG capsule Take 60 mg by mouth daily.     dicyclomine (BENTYL) 10 MG capsule Take 10 mg by mouth daily as needed for spasms.     ezetimibe (ZETIA) 10 MG tablet TAKE 1 TABLET(10 MG) BY MOUTH DAILY 90 tablet 0   FEROSUL 325 (65 Fe)  MG tablet TAKE 1 TABLET BY MOUTH DAILY WITH BREAKFAST 90 tablet 3   fluticasone (FLONASE) 50 MCG/ACT nasal spray Place 1 spray into both nostrils daily. 16 g 2   furosemide (LASIX) 40 MG tablet Take 2 tablets (80 mg total) by mouth daily. 30 tablet 0   glimepiride (AMARYL) 4 MG tablet Take 4 mg by mouth daily.     ketoconazole (NIZORAL) 2 % shampoo Apply 1 Application topically daily.     levothyroxine (SYNTHROID, LEVOTHROID) 88 MCG tablet Take 88 mcg by mouth daily before breakfast.     omeprazole (PRILOSEC) 20 MG capsule Take 20 mg by mouth daily.     Potassium Chloride ER 20 MEQ TBCR Take 1 tablet by mouth daily.  potassium chloride SA (KLOR-CON M) 20 MEQ tablet Take 1 tablet (20 mEq total) by mouth daily. 30 tablet 0   revefenacin (YUPELRI) 175 MCG/3ML nebulizer solution Take 175 mcg by nebulization in the morning and at bedtime.     spironolactone (ALDACTONE) 25 MG tablet Take 1 tablet (25 mg total) by mouth daily. 30 tablet 0    Inpatient Medications    Scheduled Meds:   Continuous Infusions:  sodium chloride      PRN Meds: docusate sodium, polyethylene glycol   ALLERGIES   Allergies  Allergen Reactions   Shellfish Allergy Shortness Of Breath, Nausea And Vomiting and Rash    Scallops     Fluzone [Influenza Virus Vaccine] Nausea And Vomiting, Palpitations and Rash    ROS   Pertinent items noted in HPI and remainder of comprehensive ROS otherwise negative.  Vitals   Vitals:   08/22/23 0700 08/22/23 0715 08/22/23 0730 08/22/23 0745  BP: (!) 107/38 (!) 107/44 (!) 110/42 (!) 120/33  Pulse: (!) 39 (!) 39 (!) 40 (!) 40  Resp: (!) 24 (!) 26 (!) 24 (!) 25  Temp:      TempSrc:      SpO2: 93% 96% 96% 100%  Weight:       No intake or output data in the 24 hours ending 08/22/23 0835 Filed Weights   08/22/23 0601  Weight: 94 kg    Physical Exam   General appearance: alert, no distress, mildly obese, and pale Neck: JVD - several cm above sternal notch, no  carotid bruit, and thyroid not enlarged, symmetric, no tenderness/mass/nodules Lungs: diminished breath sounds bibasilar Heart: irregular bradycardia, 3/6 SEM at RUSB Abdomen: soft, non-tender; bowel sounds normal; no masses,  no organomegaly Extremities: extremities normal, atraumatic, no cyanosis or edema and cool Pulses: 1+ DP pulses Skin: pale, cool, dry Neurologic: Grossly normal Psych: Pleasant  Labs   Results for orders placed or performed during the hospital encounter of 08/22/23 (from the past 48 hour(s))  CBC with Differential     Status: Abnormal   Collection Time: 08/22/23  6:07 AM  Result Value Ref Range   WBC 7.5 4.0 - 10.5 K/uL   RBC 3.33 (L) 4.22 - 5.81 MIL/uL   Hemoglobin 9.8 (L) 13.0 - 17.0 g/dL   HCT 17.6 (L) 16.0 - 73.7 %   MCV 96.1 80.0 - 100.0 fL   MCH 29.4 26.0 - 34.0 pg   MCHC 30.6 30.0 - 36.0 g/dL   RDW 10.6 (H) 26.9 - 48.5 %   Platelets 170 150 - 400 K/uL   nRBC 0.0 0.0 - 0.2 %   Neutrophils Relative % 73 %   Neutro Abs 5.5 1.7 - 7.7 K/uL   Lymphocytes Relative 14 %   Lymphs Abs 1.0 0.7 - 4.0 K/uL   Monocytes Relative 12 %   Monocytes Absolute 0.9 0.1 - 1.0 K/uL   Eosinophils Relative 0 %   Eosinophils Absolute 0.0 0.0 - 0.5 K/uL   Basophils Relative 0 %   Basophils Absolute 0.0 0.0 - 0.1 K/uL   Immature Granulocytes 1 %   Abs Immature Granulocytes 0.05 0.00 - 0.07 K/uL    Comment: Performed at [facility], [address], [place], [place] [address]  Comprehensive metabolic panel     Status: Abnormal   Collection Time: 08/22/23  6:07 AM  Result Value Ref Range   Sodium 135 135 - 145 mmol/L   Potassium 4.2 3.5 - 5.1 mmol/L   Chloride 99 98 -  111 mmol/L   CO2 23 22 - 32 mmol/L   Glucose, Bld 154 (H) 70 - 99 mg/dL    Comment: Glucose reference range applies only to samples taken after fasting for at least 8 hours.   BUN 38 (H) 8 - 23 mg/dL   Creatinine, Ser 2.95 (H) 0.61 - 1.24 mg/dL   Calcium 9.1 8.9 - 62.1 mg/dL   Total Protein 7.3  6.5 - 8.1 g/dL   Albumin 3.6 3.5 - 5.0 g/dL   AST 19 15 - 41 U/L   ALT 16 0 - 44 U/L   Alkaline Phosphatase 54 38 - 126 U/L   Total Bilirubin 1.3 (H) 0.3 - 1.2 mg/dL   GFR, Estimated 45 (L) >60 mL/min    Comment: (NOTE) Calculated using the CKD-EPI Creatinine Equation (2021)    Anion gap 13 5 - 15    Comment: Performed at [facility], [address] [place], [place] [address]  Troponin I (High Sensitivity)     Status: Abnormal   Collection Time: 08/22/23  6:07 AM  Result Value Ref Range   Troponin I (High Sensitivity) 41 (H) <18 ng/L    Comment: (NOTE) Elevated high sensitivity troponin I (hsTnI) values and significant  changes across serial measurements may suggest ACS but many other  chronic and acute conditions are known to elevate hsTnI results.  Refer to the "Links" section for chest pain algorithms and additional  guidance. Performed at [facility], [address], [place], [place] [address]   Brain natriuretic peptide     Status: Abnormal   Collection Time: 08/22/23  6:07 AM  Result Value Ref Range   B Natriuretic Peptide 898.5 (H) 0.0 - 100.0 pg/mL    Comment: Performed at [facility], [address] [place], [place] [address]    ECG   Complete heart block, HR 40 - Personally Reviewed  Telemetry   Complete heart block - Personally Reviewed  Radiology   DG Chest Portable 1 View  Result Date: 08/22/2023 CLINICAL DATA:  77 year old male with shortness of breath. EXAM: PORTABLE CHEST 1 VIEW COMPARISON:  Portable chest 06/05/2023 and earlier. FINDINGS: Portable AP semi upright view at 0631 hours. Chronic sternotomy. Stable lung volumes and mediastinal contours. Evidence of chronic pulmonary hyperinflation. No pneumothorax, pulmonary edema or consolidation. Difficult to exclude trace left pleural effusion. But no other confluent lung opacity. No acute osseous abnormality identified. Negative visible bowel gas. IMPRESSION: Possible trace left  pleural effusion but no other acute cardiopulmonary abnormality. Electronically Signed   By: Odessa Fleming M.D.   On: 08/22/2023 07:17    Cardiac Studies   N/A  Assessment   Principal Problem:   Complete heart block (HCC) Active Problems:   S/P CABG x 3   Essential hypertension   COPD GOLD II with reversibility   Coronary artery disease involving coronary bypass graft of native heart without angina pectoris   Type 2 diabetes mellitus with hyperglycemia, without long-term current use of insulin (HCC)   Mitral stenosis   Acute on chronic diastolic CHF (congestive heart failure) (HCC)   Aortic stenosis   Heart block   Cardiogenic shock (HCC)   Plan   Complete heart block/cardiogenic shock Several days of progressive dyspnea and fatigue, noted to be in complete heart block with heart rate in the 30s and 40s.  He is on no AV nodal blocking medications.  He has notable diastolic hypotension and is likely in cardiogenic shock.  Will  need to be admitted to the ICU.  Start dopamine for heart rate and significant diastolic hypotension.  I have discussed the case with cardiac EP.  He has a history of SVT ablation and has had recent atrial fibrillation, not anticoagulated due to history of GI bleed.  He will likely need a pacemaker either today or tomorrow.  EP may not be able to accommodate pacemaker today.  He will need close monitoring in the ICU.  Avoid AV nodal blocking agents.  ZOLL pads have been applied and should be at the bedside. CAD s/p CABG He denies any anginal symptoms.  EKG shows no acute ischemia.  Mild bump in troponin into the 40s however suspicious for heart failure.  Continue low-dose aspirin. Acute on chronic diastolic congestive heart failure Progressive fatigue and dyspnea on exertion with low output due to complete heart block and moderate aortic stenosis with severe mitral stenosis.  Hopefully he will benefit from inotropic agents.  He will need diuresis.  Start Lasix 40 mg IV  twice daily.  Recent LVEF 55 to 60% by echo in July 2024. Aortic and mitral stenosis This was seen recently on TEE.  He has not been a candidate for mitral valve intervention.   History of GIB He has had several episodes of this including a recent capsule endoscopy.  He has not been anticoagulated.  He was evaluated for Watchman however given multiple comorbidities was declined. COPD GOLD II He is on dual nebulizers which we will continue. DM2 Continue Amaryl and sliding scale insulin while hospitalized with Q4-hour glucose checks. Symptomatic PAD Support symptoms of claudication.  Recently seen by Dr. Allyson Sabal with plans for possible intervention.  Full code  CRITICAL CARE TIME: I have spent a total of 65 minutes with patient reviewing hospital notes, telemetry, EKGs, labs and examining the patient as well as establishing an assessment and plan that was discussed with the patient.  > 50% of time was spent in direct patient care. The patient is critically ill with multi-organ system failure and requires high complexity decision making for assessment and support, frequent evaluation and titration of therapies, application of advanced monitoring technologies and extensive interpretation of multiple databases.    Length of Stay:  LOS: 0 days   Chrystie Nose, MD, [facility], FACP  [place]  [facility]  Medical Director of the Advanced Lipid Disorders &  Cardiovascular Risk Reduction Clinic Diplomate of the American Board of Clinical Lipidology Attending Cardiologist  Direct Dial: [phone]  Fax: [phone]  Website:  [email or web address] 08/22/2023, 8:35 AM

## 2023-08-22 NOTE — ED Triage Notes (Signed)
Patient told by PCP to come here for generalized body aches and shortness of breath x 4 days.

## 2023-08-22 NOTE — Plan of Care (Signed)
Lasix given early prior to PPM insertion tomorrow per Dr. Elberta Fortis for fluid removal (see MAR).

## 2023-08-22 NOTE — Plan of Care (Signed)
Continuing to monitor

## 2023-08-22 NOTE — ED Notes (Signed)
ED TO INPATIENT HANDOFF REPORT  ED Nurse Name and Phone #: Lenell Antu Name/Age/Gender Jeremiah [name] 77 y.o. male Room/Bed: 016C/016C  Code Status   Code Status: Full Code  Home/SNF/Other Home Patient oriented to: self, place, time, and situation Is this baseline? Yes   Triage Complete: Triage complete  Chief Complaint Complete heart block (HCC) [I44.2]  Triage Note Patient told by PCP to come here for generalized body aches and shortness of breath x 4 days.    Allergies Allergies  Allergen Reactions   Shellfish Allergy Shortness Of Breath, Nausea And Vomiting and Rash    Scallops     Fluzone [Influenza Virus Vaccine] Nausea And Vomiting, Palpitations and Rash    Level of Care/Admitting Diagnosis ED Disposition     ED Disposition  Admit   Condition  --   Comment  Hospital Area: [facility] [[id number]]  Level of Care: ICU [6]  May admit patient to [facility] or [facility] if equivalent level of care is available:: No  Covid Evaluation: Confirmed COVID Negative  Diagnosis: Complete heart block [facility]) [[id number]]  Admitting Physician: Kela Millin  Attending Physician: Chrystie Nose [[id number]]  Bed request comments: 2H  Certification:: I certify this patient will need inpatient services for at least 2 midnights  Expected Medical Readiness: 08/22/2023          B Medical/Surgery History Past Medical History:  Diagnosis Date   A-fib (HCC)    Acute on chronic systolic (congestive) heart failure (HCC) 06/10/2017   Acute pulmonary edema (HCC)    Anemia    Aortic valve sclerosis 12/2018   Noted on ECHO   Arthritis    CAD (coronary artery disease)    a. 2006: CABG in 2006 with LIMA-LAD, SVG-OM1, and SVG-RPDA   Cardiomegaly 12/2018   Stable, noted on CXR   Carpal tunnel syndrome    Right   Chronic pain 03/21/2016   Colon cancer (HCC) 2006   COPD (chronic obstructive pulmonary disease) (HCC)    Diabetes mellitus without complication  (HCC)    DVT (deep venous thrombosis) (HCC)    Right   Essential hypertension 03/21/2016   GERD (gastroesophageal reflux disease)    History of blood transfusion    History of Clostridioides difficile infection    History of prostate cancer 03/21/2016   History of PSVT (paroxysmal supraventricular tachycardia)    HLD (hyperlipidemia)    HTN (hypertension)    Hx of CABG 2006   Hypercholesteremia 03/21/2016   Hypothyroidism    LAE (left atrial enlargement) 12/2018   Severe, Noted on ECHO   LVH (left ventricular hypertrophy) 12/2018   Mild, Noted on ECHO   Medication management 03/21/2016   Mitral annular calcification 12/2018   with mild MS, Noted on ECHO   Morbid obesity (HCC) 03/21/2016   Myocardial infarct (HCC)    OSA (obstructive sleep apnea) 03/21/2016   uses oxygen at night time   Pain in right ankle and joints of right foot 03/21/2016   Paronychia 03/21/2016   Phimosis    Pneumonia    Primary insomnia 03/21/2016   Prostate cancer (HCC) 2008   Pulmonary hypertension (HCC) 12/2018   Moderate, Noted on ECHO   Tobacco dependence 03/21/2016   Tricuspid regurgitation 12/2018   Mild, Noted on ECHO   Past Surgical History:  Procedure Laterality Date   ANKLE SURGERY Right 12/2013   CARDIAC CATHETERIZATION N/A 05/08/2016   Procedure: Left Heart Cath and Cors/Grafts Angiography;  Surgeon:  Lyn Records, MD;  Location: [facility] INVASIVE CV LAB;  Service: Cardiovascular;  Laterality: N/A;   CIRCUMCISION N/A 10/07/2019   Procedure: CIRCUMCISION ADULT;  Surgeon: Crist Fat, MD;  Location: [facility] ORS;  Service: Urology;  Laterality: N/A;   CORONARY ARTERY BYPASS GRAFT  2006   x3   ELECTROPHYSIOLOGIC STUDY N/A 08/24/2016   Procedure: SVT Ablation;  Surgeon: Will Jorja Loa, MD;  Location: [facility] INVASIVE CV LAB;  Service: Cardiovascular;  Laterality: N/A;   PROSTATE SURGERY  2008   PTCA     UMBILICAL HERNIA REPAIR N/A 05/26/2022   Procedure: PRIMARY REPAIR OF STRANGULATED UMBILICAL  HERNIA WITH PARTIAL OMENTECTOMY;  Surgeon: Almond Lint, MD;  Location: [facility] OR;  Service: General;  Laterality: N/A;     A IV Location/Drains/Wounds Patient Lines/Drains/Airways Status     Active Line/Drains/Airways     Name Placement date Placement time Site Days   Peripheral IV 08/22/23 18 G Anterior;Distal;Right;Upper Arm 08/22/23  0646  Arm  less than 1   Peripheral IV 08/22/23 20 G Posterior;Right Forearm 08/22/23  0807  Forearm  less than 1            Intake/Output Last 24 hours No intake or output data in the 24 hours ending 08/22/23 0830  Labs/Imaging Results for orders placed or performed during the hospital encounter of 08/22/23 (from the past 48 hour(s))  CBC with Differential     Status: Abnormal   Collection Time: 08/22/23  6:07 AM  Result Value Ref Range   WBC 7.5 4.0 - 10.5 K/uL   RBC 3.33 (L) 4.22 - 5.81 MIL/uL   Hemoglobin 9.8 (L) 13.0 - 17.0 g/dL   HCT 16.0 (L) 10.9 - 32.3 %   MCV 96.1 80.0 - 100.0 fL   MCH 29.4 26.0 - 34.0 pg   MCHC 30.6 30.0 - 36.0 g/dL   RDW 55.7 (H) 32.2 - 02.5 %   Platelets 170 150 - 400 K/uL   nRBC 0.0 0.0 - 0.2 %   Neutrophils Relative % 73 %   Neutro Abs 5.5 1.7 - 7.7 K/uL   Lymphocytes Relative 14 %   Lymphs Abs 1.0 0.7 - 4.0 K/uL   Monocytes Relative 12 %   Monocytes Absolute 0.9 0.1 - 1.0 K/uL   Eosinophils Relative 0 %   Eosinophils Absolute 0.0 0.0 - 0.5 K/uL   Basophils Relative 0 %   Basophils Absolute 0.0 0.0 - 0.1 K/uL   Immature Granulocytes 1 %   Abs Immature Granulocytes 0.05 0.00 - 0.07 K/uL    Comment: Performed at [facility], [address] [place], [place] [address]  Comprehensive metabolic panel     Status: Abnormal   Collection Time: 08/22/23  6:07 AM  Result Value Ref Range   Sodium 135 135 - 145 mmol/L   Potassium 4.2 3.5 - 5.1 mmol/L   Chloride 99 98 - 111 mmol/L   CO2 23 22 - 32 mmol/L   Glucose, Bld 154 (H) 70 - 99 mg/dL    Comment: Glucose reference range applies only to samples taken  after fasting for at least 8 hours.   BUN 38 (H) 8 - 23 mg/dL   Creatinine, Ser 2.37 (H) 0.61 - 1.24 mg/dL   Calcium 9.1 8.9 - 62.8 mg/dL   Total Protein 7.3 6.5 - 8.1 g/dL   Albumin 3.6 3.5 - 5.0 g/dL   AST 19 15 - 41 U/L   ALT 16 0 - 44 U/L  Alkaline Phosphatase 54 38 - 126 U/L   Total Bilirubin 1.3 (H) 0.3 - 1.2 mg/dL   GFR, Estimated 45 (L) >60 mL/min    Comment: (NOTE) Calculated using the CKD-EPI Creatinine Equation (2021)    Anion gap 13 5 - 15    Comment: Performed at [facility], [address] [place], [place] [address]  Troponin I (High Sensitivity)     Status: Abnormal   Collection Time: 08/22/23  6:07 AM  Result Value Ref Range   Troponin I (High Sensitivity) 41 (H) <18 ng/L    Comment: (NOTE) Elevated high sensitivity troponin I (hsTnI) values and significant  changes across serial measurements may suggest ACS but many other  chronic and acute conditions are known to elevate hsTnI results.  Refer to the "Links" section for chest pain algorithms and additional  guidance. Performed at [facility], [address] [place], [place] [address]   Brain natriuretic peptide     Status: Abnormal   Collection Time: 08/22/23  6:07 AM  Result Value Ref Range   B Natriuretic Peptide 898.5 (H) 0.0 - 100.0 pg/mL    Comment: Performed at [facility], [address] [place], [place] [address]   DG Chest Portable 1 View  Result Date: 08/22/2023 CLINICAL DATA:  77 year old male with shortness of breath. EXAM: PORTABLE CHEST 1 VIEW COMPARISON:  Portable chest 06/05/2023 and earlier. FINDINGS: Portable AP semi upright view at 0631 hours. Chronic sternotomy. Stable lung volumes and mediastinal contours. Evidence of chronic pulmonary hyperinflation. No pneumothorax, pulmonary edema or consolidation. Difficult to exclude trace left pleural effusion. But no other confluent lung opacity. No acute osseous abnormality identified. Negative visible bowel gas. IMPRESSION:  Possible trace left pleural effusion but no other acute cardiopulmonary abnormality. Electronically Signed   By: Odessa Fleming M.D.   On: 08/22/2023 07:17    Pending Labs [employer] (From admission, onward)     Start     Ordered   Signed and Held  Hemoglobin A1c  Once,   R       Comments: To assess prior glycemic control    Signed and Held   Signed and Held  Basic metabolic panel  Daily,   R     Comments: As Scheduled for 5 days    Signed and Held   Signed and Held  CBC  (heparin)  Once,   R       Comments: Baseline for heparin therapy IF NOT ALREADY DRAWN.  Notify MD if PLT < 100 K.    Signed and Held   Signed and Held  Creatinine, serum  (heparin)  Once,   R       Comments: Baseline for heparin therapy IF NOT ALREADY DRAWN.    Signed and Held   Signed and Held  TSH  Tomorrow morning,   R        Signed and Held            Vitals/Pain Today's Vitals   08/22/23 0715 08/22/23 0727 08/22/23 0730 08/22/23 0745  BP: (!) 107/44  (!) 110/42 (!) 120/33  Pulse: (!) 39  (!) 40 (!) 40  Resp: (!) 26  (!) 24 (!) 25  Temp:      TempSrc:      SpO2: 96%  96% 100%  Weight:      PainSc:  0-No pain      Isolation Precautions No active isolations  Medications Medications - No data  to display  Mobility walks with person assist     Focused Assessments Cardiac Assessment Handoff:    Lab Results  Component Value Date   CKTOTAL 30 (L) 02/05/2023   TROPONINI 0.03 (HH) 12/26/2018   Lab Results  Component Value Date   DDIMER 0.80 (H) 12/29/2018   Does the Patient currently have chest pain? No    R Recommendations: See Admitting Provider Note  Report given to:   Additional Notes:

## 2023-08-22 NOTE — ED Notes (Signed)
Patient placed on zoll for monitoring

## 2023-08-22 NOTE — ED Provider Notes (Signed)
[place] EMERGENCY DEPARTMENT AT [facility] Provider Note   CSN: [id number] Arrival date & time: 08/22/23  0556     History  Chief Complaint  Patient presents with   Shortness of Breath    Jeremiah [name] is a 77 y.o. male.  HPI     This is a 77 year old male with a history of coronary artery disease and CABG, SVT status post ablations, heart failure who presents with shortness of breath.  Patient reports 4-day history of worsening shortness of breath.  He reports dyspnea on exertion.  No significant orthopnea.  He is not having chest pain.  Has not noted significant lower extremity swelling.  No fevers or cough.  Patient has not had any change in medications and is not on any AV nodal blocking agents.  Home Medications Prior to Admission medications   Medication Sig Start Date End Date Taking? Authorizing Provider  aspirin EC 81 MG tablet Take 1 tablet (81 mg total) by mouth daily. Swallow whole. 04/17/23   Orbie Pyo, MD  atorvastatin (LIPITOR) 80 MG tablet Take 80 mg by mouth daily.    [provider]  budesonide (PULMICORT) 0.5 MG/2ML nebulizer solution Take 2 mLs (0.5 mg total) by nebulization 2 (two) times daily. 05/13/23   Martina Sinner, MD  cetirizine (ZYRTEC) 10 MG tablet Take 10 mg by mouth daily as needed for allergies.    [provider]  colchicine 0.6 MG tablet Take 0.6 mg by mouth daily as needed (Gout).    [provider]  dexlansoprazole (DEXILANT) 60 MG capsule Take 60 mg by mouth daily. 01/29/23   [provider]  dicyclomine (BENTYL) 10 MG capsule Take 10 mg by mouth daily as needed for spasms. 02/19/23   [provider]  ezetimibe (ZETIA) 10 MG tablet TAKE 1 TABLET(10 MG) BY MOUTH DAILY 08/01/23   Hilty, Lisette Abu, MD  FEROSUL 325 (65 Fe) MG tablet TAKE 1 TABLET BY MOUTH DAILY WITH BREAKFAST 07/08/23   Hilty, Lisette Abu, MD  fluticasone (FLONASE) 50 MCG/ACT nasal spray Place 1 spray into both nostrils  daily. 06/26/23   Hilty, Lisette Abu, MD  furosemide (LASIX) 40 MG tablet Take 2 tablets (80 mg total) by mouth daily. 06/11/23   Zannie Cove, MD  glimepiride (AMARYL) 4 MG tablet Take 4 mg by mouth daily.    [provider]  levothyroxine (SYNTHROID, LEVOTHROID) 88 MCG tablet Take 88 mcg by mouth daily before breakfast.    [provider]  metoCLOPramide (REGLAN) 5 MG tablet Take 5 mg by mouth as needed for nausea.    [provider]  potassium chloride SA (KLOR-CON M) 20 MEQ tablet Take 1 tablet (20 mEq total) by mouth daily. 06/11/23   Zannie Cove, MD  predniSONE (DELTASONE) 50 MG tablet Take 50 mg by mouth as needed.    [provider]  spironolactone (ALDACTONE) 25 MG tablet Take 1 tablet (25 mg total) by mouth daily. 06/12/23   Zannie Cove, MD      Allergies    Shellfish allergy and Fluzone [influenza virus vaccine]    Review of Systems   Review of Systems  Constitutional:  Negative for fever.  Respiratory:  Positive for shortness of breath. Negative for cough.   Cardiovascular:  Negative for chest pain and leg swelling.  All other systems reviewed and are negative.   Physical Exam Updated Vital Signs BP (!) 107/48 (BP Location: Right Arm)   Pulse (!) 44   Temp  98.2 F (36.8 C) (Oral)   Resp (!) 22   Wt 94 kg   SpO2 95%   BMI 31.51 kg/m  Physical Exam Vitals and nursing note reviewed.  Constitutional:      Appearance: He is well-developed. He is obese.  HENT:     Head: Normocephalic and atraumatic.  Eyes:     Pupils: Pupils are equal, round, and reactive to light.  Cardiovascular:     Rate and Rhythm: Regular rhythm. Bradycardia present.     Heart sounds: Normal heart sounds.     Comments: Midline sternotomy scar Pulmonary:     Effort: Pulmonary effort is normal. Tachypnea present. No respiratory distress.     Breath sounds: Normal breath sounds. No wheezing.  Abdominal:     General: Bowel sounds are normal.      Palpations: Abdomen is soft.     Tenderness: There is no abdominal tenderness. There is no rebound.  Musculoskeletal:     Cervical back: Neck supple.     Comments: Trace bilateral lower extremity edema  Skin:    General: Skin is warm and dry.  Neurological:     Mental Status: He is alert and oriented to person, place, and time.  Psychiatric:        Mood and Affect: Mood normal.     ED Results / Procedures / Treatments   Labs (all labs ordered are listed, but only abnormal results are displayed) Labs Reviewed  CBC WITH DIFFERENTIAL/PLATELET - Abnormal; Notable for the following components:      Result Value   RBC 3.33 (*)    Hemoglobin 9.8 (*)    HCT 32.0 (*)    RDW 16.2 (*)    All other components within normal limits  COMPREHENSIVE METABOLIC PANEL  BRAIN NATRIURETIC PEPTIDE  TROPONIN I (HIGH SENSITIVITY)    EKG EKG Interpretation Date/Time:  Thursday August 22 2023 06:17:21 EDT Ventricular Rate:  40 PR Interval:    QRS Duration:  90 QT Interval:  555 QTC Calculation: 453 R Axis:   48  Text Interpretation: AV block, complete (third degree) Nonspecific repol abnormality, diffuse leads Confirmed by Ross Marcus ([id number]) on 08/22/2023 6:41:49 AM  Radiology No results found.  Procedures .Critical Care  Performed by: Shon Baton, MD Authorized by: Shon Baton, MD   Critical care provider statement:    Critical care time (minutes):  41   Critical care was necessary to treat or prevent imminent or life-threatening deterioration of the following conditions:  Cardiac failure (Heart block)   Critical care was time spent personally by me on the following activities:  Development of treatment plan with patient or surrogate, discussions with consultants, evaluation of patient's response to treatment, examination of patient, ordering and review of laboratory studies, ordering and review of radiographic studies, ordering and performing treatments and  interventions, pulse oximetry, re-evaluation of patient's condition and review of old charts     Medications Ordered in ED Medications - No data to display  ED Course/ Medical Decision Making/ A&P Clinical Course as of 08/22/23 0641  Thu Aug 22, 2023  0636 Spoke to [name].  He will evaluate the patient. [CH]    Clinical Course User Index [CH] Brendaliz Kuk, Mayer Masker, MD                                 Medical Decision Making Amount and/or Complexity of Data Reviewed Labs: ordered. Radiology:  ordered.  Risk Decision regarding hospitalization.   This patient presents to the ED for concern of shortness of breath, this involves an extensive number of treatment options, and is a complaint that carries with it a high risk of complications and morbidity.  I considered the following differential and admission for this acute, potentially life threatening condition.  The differential diagnosis includes CHF, pneumonia, pneumothorax, viral illness  MDM:    This is a 77 year old male who presents with shortness of breath.  He is mildly tachypneic but not in any respiratory distress.  Satting 95%.  EKG is concerning for complete heart block.  Escape rhythm in the 40s.  Highly suspect this is contributing to his symptoms.  Patient was placed on pacer pads.  At the moment his blood pressure is stable.  (Labs, imaging, consults)  Labs: I Ordered, and personally interpreted labs.  The pertinent results include: CBC, BMP, BNP, troponin  Imaging Studies ordered: I ordered imaging studies including chest x-ray I independently visualized and interpreted imaging. I agree with the radiologist interpretation  Additional history obtained from chart review.  External records from outside source obtained and reviewed including cardiology notes Cardiac Monitoring: The patient was maintained on a cardiac monitor.  If on the cardiac monitor, I personally viewed and interpreted the cardiac monitored which  showed an underlying rhythm of: Complete heart block  Reevaluation: After the interventions noted above, I reevaluated the patient and found that they have :stayed the same  Social Determinants of Health:  lives independently  Disposition: Admit  Co morbidities that complicate the patient evaluation  Past Medical History:  Diagnosis Date   A-fib (HCC)    Acute on chronic systolic (congestive) heart failure (HCC) 06/10/2017   Acute pulmonary edema (HCC)    Anemia    Aortic valve sclerosis 12/2018   Noted on ECHO   Arthritis    CAD (coronary artery disease)    a. 2006: CABG in 2006 with LIMA-LAD, SVG-OM1, and SVG-RPDA   Cardiomegaly 12/2018   Stable, noted on CXR   Carpal tunnel syndrome    Right   Chronic pain 03/21/2016   Colon cancer (HCC) 2006   COPD (chronic obstructive pulmonary disease) (HCC)    Diabetes mellitus without complication (HCC)    DVT (deep venous thrombosis) (HCC)    Right   Essential hypertension 03/21/2016   GERD (gastroesophageal reflux disease)    History of blood transfusion    History of Clostridioides difficile infection    History of prostate cancer 03/21/2016   History of PSVT (paroxysmal supraventricular tachycardia)    HLD (hyperlipidemia)    HTN (hypertension)    Hx of CABG 2006   Hypercholesteremia 03/21/2016   Hypothyroidism    LAE (left atrial enlargement) 12/2018   Severe, Noted on ECHO   LVH (left ventricular hypertrophy) 12/2018   Mild, Noted on ECHO   Medication management 03/21/2016   Mitral annular calcification 12/2018   with mild MS, Noted on ECHO   Morbid obesity (HCC) 03/21/2016   Myocardial infarct (HCC)    OSA (obstructive sleep apnea) 03/21/2016   uses oxygen at night time   Pain in right ankle and joints of right foot 03/21/2016   Paronychia 03/21/2016   Phimosis    Pneumonia    Primary insomnia 03/21/2016   Prostate cancer (HCC) 2008   Pulmonary hypertension (HCC) 12/2018   Moderate, Noted on ECHO   Tobacco  dependence 03/21/2016   Tricuspid regurgitation 12/2018   Mild, Noted on ECHO  Medicines No orders of the defined types were placed in this encounter.   I have reviewed the patients home medicines and have made adjustments as needed  Problem List / ED Course: Problem List Items Addressed This Visit   None Visit Diagnoses     Complete heart block (HCC)    -  Primary                   Final Clinical Impression(s) / ED Diagnoses Final diagnoses:  Complete heart block [facility])    Rx / DC Orders ED Discharge Orders     None         Shon Baton, MD 08/22/23 [phone]

## 2023-08-22 NOTE — Consult Note (Signed)
NAME:  Jeremiah [name], MRN:  [id number], DOB:  05-12-1946, LOS: 0 ADMISSION DATE:  08/22/2023, CONSULTATION DATE: 08/22/2023 REFERRING MD: Cardiology, CHIEF COMPLAINT: Shortness of breath, new onset of heart block, symptomatic bradycardia  History of Present Illness:  Jeremiah [name] 77 year old male with an extensive past medical history that is well-documented below.  He has spent the last 2 months in and out of [facility] with GI bleed, valvular disease, reported stents of the femoral arteries.  He presents to [facility] with worsening shortness of breath although he is followed by Dr. Sherene Sires and is O2 dependent at home.  He is found to be in heart block with a ventricular rate of 30-40 but was systolic blood pressure greater than 90.  He is awake alert following commands moves all extremities and does not appear to be in acute distress at this time.  He does have an external pacemaker system in place the plan is to utilize dopamine and as a transition to implanted pacemaker.  Pulmonary critical care asked to evaluate.  Pertinent  Medical History   Past Medical History:  Diagnosis Date   A-fib (HCC)    Acute on chronic systolic (congestive) heart failure (HCC) 06/10/2017   Acute pulmonary edema (HCC)    Anemia    Aortic valve sclerosis 12/2018   Noted on ECHO   Arthritis    CAD (coronary artery disease)    a. 2006: CABG in 2006 with LIMA-LAD, SVG-OM1, and SVG-RPDA   Cardiomegaly 12/2018   Stable, noted on CXR   Carpal tunnel syndrome    Right   Chronic pain 03/21/2016   Colon cancer (HCC) 2006   COPD (chronic obstructive pulmonary disease) (HCC)    Diabetes mellitus without complication (HCC)    DVT (deep venous thrombosis) (HCC)    Right   Essential hypertension 03/21/2016   GERD (gastroesophageal reflux disease)    History of blood transfusion    History of Clostridioides difficile infection    History of prostate cancer 03/21/2016   History of PSVT (paroxysmal  supraventricular tachycardia)    HLD (hyperlipidemia)    HTN (hypertension)    Hx of CABG 2006   Hypercholesteremia 03/21/2016   Hypothyroidism    LAE (left atrial enlargement) 12/2018   Severe, Noted on ECHO   LVH (left ventricular hypertrophy) 12/2018   Mild, Noted on ECHO   Medication management 03/21/2016   Mitral annular calcification 12/2018   with mild MS, Noted on ECHO   Morbid obesity (HCC) 03/21/2016   Myocardial infarct (HCC)    OSA (obstructive sleep apnea) 03/21/2016   uses oxygen at night time   Pain in right ankle and joints of right foot 03/21/2016   Paronychia 03/21/2016   Phimosis    Pneumonia    Primary insomnia 03/21/2016   Prostate cancer (HCC) 2008   Pulmonary hypertension (HCC) 12/2018   Moderate, Noted on ECHO   Tobacco dependence 03/21/2016   Tricuspid regurgitation 12/2018   Mild, Noted on ECHO     Significant Hospital Events: Including procedures, antibiotic start and stop dates in addition to other pertinent events     Interim History / Subjective:  Presents with shortness of breath  Objective   Blood pressure (!) 103/42, pulse (!) 50, temperature 98.2 F (36.8 C), temperature source Oral, resp. rate 16, weight 94 kg, SpO2 98%.       No intake or output data in the 24 hours ending 08/22/23 0926 Filed Weights   08/22/23 0601  Weight: 94 kg    Examination: General: Well-nourished well-developed male who is very awake and alert HENT: No JVD lymphadenopathy is appreciated Lungs: Decreased breath sounds throughout Cardiovascular: Heart sounds are irregular with ventricular rate of 43 and a systolic blood pressure 120 Abdomen: Soft nontender positive bowel sounds Extremities: Lower extremities without edema good pulses Neuro: Patient intact without focal defect GU: Voids  Resolved Hospital Problem list     Assessment & Plan:  Symptomatic third-degree heart block with plans for pacemaker per cardiology Dopamine as needed as  transitional to pacemaker Admit to the intensive care unit Transcutaneous pacing if needed Cardiac monitoring   COPD followed by Dr. Sherene Sires former smoker quit 6 years ago O2 as needed Liters  Extensive cardiac history including coronary bypass graft x 3, femoral stents, history of atrial fibrillation ventricular response status post ablation.. Per cardiology  Extensive GI history of GI bleed acute blood loss anemia. No anticoagulation Monitor CBC  Diabetes CBG (last 3)  No results for input(s): "GLUCAP" in the last 72 hours. Sliding scale insulin protocol Best Practice (right click and "Reselect all SmartList Selections" daily)   Diet/type: NPO DVT prophylaxis: not indicated GI prophylaxis: PPI Lines: N/A Foley:  N/A Code Status:  full code Last date of multidisciplinary goals of care discussion [tbd] Patient and wife confirm full CODE STATUS Labs   CBC: Recent Labs  Lab 08/22/23 0607 08/22/23 0845  WBC 7.5 7.9  NEUTROABS 5.5  --   HGB 9.8* 9.4*  HCT 32.0* 29.8*  MCV 96.1 97.1  PLT 170 156    Basic Metabolic Panel: Recent Labs  Lab 08/22/23 0607  NA 135  K 4.2  CL 99  CO2 23  GLUCOSE 154*  BUN 38*  CREATININE 1.58*  CALCIUM 9.1   GFR: Estimated Creatinine Clearance: 44.2 mL/min (A) (by C-G formula based on SCr of 1.58 mg/dL (H)). Recent Labs  Lab 08/22/23 0607 08/22/23 0845 08/22/23 0852  WBC 7.5 7.9  --   LATICACIDVEN  --   --  1.5    Liver Function Tests: Recent Labs  Lab 08/22/23 0607  AST 19  ALT 16  ALKPHOS 54  BILITOT 1.3*  PROT 7.3  ALBUMIN 3.6   No results for input(s): "LIPASE", "AMYLASE" in the last 168 hours. No results for input(s): "AMMONIA" in the last 168 hours.  ABG    Component Value Date/Time   HCO3 36.1 (H) 02/25/2017 1952   TCO2 33 (H) 06/05/2023 1549   O2SAT 86.0 02/25/2017 1952     Coagulation Profile: No results for input(s): "INR", "PROTIME" in the last 168 hours.  Cardiac Enzymes: No results for  input(s): "CKTOTAL", "CKMB", "CKMBINDEX", "TROPONINI" in the last 168 hours.  HbA1C: Hgb A1c MFr Bld  Date/Time Value Ref Range Status  05/26/2022 09:02 AM 7.7 (H) 4.8 - 5.6 % Final    Comment:    (NOTE) Pre diabetes:          5.7%-6.4%  Diabetes:              >6.4%  Glycemic control for   <7.0% adults with diabetes   03/21/2021 12:17 PM 8.9 (H) 4.8 - 5.6 % Final    Comment:             Prediabetes: 5.7 - 6.4          Diabetes: >6.4          Glycemic control for adults with diabetes: <7.0     CBG: No results for  input(s): "GLUCAP" in the last 168 hours.  Review of Systems:   10 point review of system taken, please see HPI for positives and negatives.   Past Medical History:  He,  has a past medical history of A-fib [facility]), Acute on chronic systolic (congestive) heart failure (HCC) (06/10/2017), Acute pulmonary edema (HCC), Anemia, Aortic valve sclerosis (12/2018), Arthritis, CAD (coronary artery disease), Cardiomegaly (12/2018), Carpal tunnel syndrome, Chronic pain (03/21/2016), Colon cancer (HCC) (2006), COPD (chronic obstructive pulmonary disease) (HCC), Diabetes mellitus without complication (HCC), DVT (deep venous thrombosis) (HCC), Essential hypertension (03/21/2016), GERD (gastroesophageal reflux disease), History of blood transfusion, History of Clostridioides difficile infection, History of prostate cancer (03/21/2016), History of PSVT (paroxysmal supraventricular tachycardia), HLD (hyperlipidemia), HTN (hypertension), CABG (2006), Hypercholesteremia (03/21/2016), Hypothyroidism, LAE (left atrial enlargement) (12/2018), LVH (left ventricular hypertrophy) (12/2018), Medication management (03/21/2016), Mitral annular calcification (12/2018), Morbid obesity (HCC) (03/21/2016), Myocardial infarct (HCC), OSA (obstructive sleep apnea) (03/21/2016), Pain in right ankle and joints of right foot (03/21/2016), Paronychia (03/21/2016), Phimosis, Pneumonia, Primary insomnia (03/21/2016),  Prostate cancer (HCC) (2008), Pulmonary hypertension (HCC) (12/2018), Tobacco dependence (03/21/2016), and Tricuspid regurgitation (12/2018).   Surgical History:   Past Surgical History:  Procedure Laterality Date   ANKLE SURGERY Right 12/2013   CARDIAC CATHETERIZATION N/A 05/08/2016   Procedure: Left Heart Cath and Cors/Grafts Angiography;  Surgeon: Lyn Records, MD;  Location: [facility] INVASIVE CV LAB;  Service: Cardiovascular;  Laterality: N/A;   CIRCUMCISION N/A 10/07/2019   Procedure: CIRCUMCISION ADULT;  Surgeon: Crist Fat, MD;  Location: [facility] ORS;  Service: Urology;  Laterality: N/A;   CORONARY ARTERY BYPASS GRAFT  2006   x3   ELECTROPHYSIOLOGIC STUDY N/A 08/24/2016   Procedure: SVT Ablation;  Surgeon: Will Jorja Loa, MD;  Location: [facility] INVASIVE CV LAB;  Service: Cardiovascular;  Laterality: N/A;   PROSTATE SURGERY  2008   PTCA     UMBILICAL HERNIA REPAIR N/A 05/26/2022   Procedure: PRIMARY REPAIR OF STRANGULATED UMBILICAL HERNIA WITH PARTIAL OMENTECTOMY;  Surgeon: Almond Lint, MD;  Location: [facility] OR;  Service: General;  Laterality: N/A;     Social History:   reports that he quit smoking about 6 years ago. His smoking use included cigarettes. He started smoking about 66 years ago. He has a 45 pack-year smoking history. He has never used smokeless tobacco. He reports that he does not currently use alcohol. He reports that he does not use drugs.   Family History:  His family history includes Dementia in his mother; Diabetes in his sister.   Allergies Allergies  Allergen Reactions   Shellfish Allergy Shortness Of Breath, Nausea And Vomiting and Rash    Scallops     Fluzone [Influenza Virus Vaccine] Nausea And Vomiting, Palpitations and Rash     Home Medications  Prior to Admission medications   Medication Sig Start Date End Date Taking? Authorizing Provider  aspirin EC 81 MG tablet Take 1 tablet (81 mg total) by mouth daily. Swallow whole. 04/17/23  Yes Orbie Pyo,  MD  budesonide (PULMICORT) 0.5 MG/2ML nebulizer solution Take 2 mLs (0.5 mg total) by nebulization 2 (two) times daily. 05/13/23  Yes Martina Sinner, MD  colchicine 0.6 MG tablet Take 0.6 mg by mouth daily as needed (Gout).   Yes [provider]  dexlansoprazole (DEXILANT) 60 MG capsule Take 60 mg by mouth daily. 01/29/23  Yes [provider]  dicyclomine (BENTYL) 10 MG capsule Take 10 mg by mouth daily as needed for spasms. 02/19/23  Yes [provider]  ezetimibe (ZETIA) 10 MG tablet TAKE 1 TABLET(10 MG) BY MOUTH DAILY 08/01/23  Yes Hilty, Lisette Abu, MD  FEROSUL 325 (65 Fe) MG tablet TAKE 1 TABLET BY MOUTH DAILY WITH BREAKFAST 07/08/23  Yes Hilty, Lisette Abu, MD  fluticasone (FLONASE) 50 MCG/ACT nasal spray Place 1 spray into both nostrils daily. 06/26/23  Yes Hilty, Lisette Abu, MD  furosemide (LASIX) 40 MG tablet Take 2 tablets (80 mg total) by mouth daily. 06/11/23  Yes Zannie Cove, MD  glimepiride (AMARYL) 4 MG tablet Take 4 mg by mouth daily.   Yes [provider]  ketoconazole (NIZORAL) 2 % shampoo Apply 1 Application topically daily. 07/11/23  Yes [provider]  levothyroxine (SYNTHROID, LEVOTHROID) 88 MCG tablet Take 88 mcg by mouth daily before breakfast.   Yes [provider]  omeprazole (PRILOSEC) 20 MG capsule Take 20 mg by mouth daily. 03/24/23  Yes [provider]  Potassium Chloride ER 20 MEQ TBCR Take 1 tablet by mouth daily. 07/11/23  Yes [provider]  potassium chloride SA (KLOR-CON M) 20 MEQ tablet Take 1 tablet (20 mEq total) by mouth daily. 06/11/23  Yes Zannie Cove, MD  revefenacin [facility]) 175 MCG/3ML nebulizer solution Take 175 mcg by nebulization in the morning and at bedtime.   Yes [provider]  spironolactone (ALDACTONE) 25 MG tablet Take 1 tablet (25 mg total) by mouth daily. 06/12/23  Yes Zannie Cove, MD     Critical care time: 4 min    Brett Canales Keyana Guevara ACNP Acute Care Nurse  Practitioner [facility] Pulmonary/Critical Care Please consult Amion 08/22/2023, 9:28 AM

## 2023-08-22 NOTE — Consult Note (Addendum)
Cardiology Consultation   Patient ID: Jeremiah [name] MRN: [id number]; DOB: [birth year]  Admit date: 08/22/2023 Date of Consult: 08/22/2023  PCP:  Jeremiah Bussing, MD   [facility] Providers Cardiologist:  Jeremiah Nose, MD  Electrophysiologist:  Jeremiah Lemming, MD  {    Patient Profile:   Jeremiah [name] is a 77 y.o. male with a hx of COPD (on home O2), OSA,   CAD (CABG 2006, cath medically managed disease, patent grafts 2017), SVT (AVNRT ablation 2018, Dr. Elberta [name]), severe and recurrnt GIBs (ischemic colitis), AFib (found March 2024 > not felt a candidate given his severe MS and lung disease), PVD (s/pmesenteric ischemia and underwent left brachial artery cutdown and mesenteric artery and celiac artery stenting April 2024), VHD (severe MS, at least mod AS), who is being seen 08/22/2023 for the evaluation of CHB at the request of Jeremiah [name].  History of Present Illness:   Jeremiah [name] arrived  with c/o a few days of progressive SOB and fatigue, weakness, , saw his PMD found him bradycardic and referred to the ER found in CHB. Jeremiah [name] who knows him well, saw/admitted him Rates 30's-40's w/diastolic hypotension and started on dopamine Felt to be volume OL, started on IV lasix EP is asked on board  LABS K+ 4.2 BUN/Creat 38/1.43 BNP 898 HS Trop 41, 55 Lactic acid 1.5 WBC 7.5 H/H 9.4/29 Plts 141  On no nodal blocking agents  Has been "OK" though in the last 3-4 days unusually SOB, weak, no near syncope or syncope. No CP, palpitations or cardiac awareness   Past Medical History:  Diagnosis Date   A-fib (HCC)    Acute on chronic systolic (congestive) heart failure (HCC) 06/10/2017   Acute pulmonary edema (HCC)    Anemia    Aortic valve sclerosis 12/2018   Noted on ECHO   Arthritis    CAD (coronary artery disease)    a. 2006: CABG in 2006 with LIMA-LAD, SVG-OM1, and SVG-RPDA   Cardiomegaly 12/2018   Stable, noted on CXR   Carpal tunnel syndrome    Right    Chronic pain 03/21/2016   Colon cancer (HCC) 2006   COPD (chronic obstructive pulmonary disease) (HCC)    Diabetes mellitus without complication (HCC)    DVT (deep venous thrombosis) (HCC)    Right   Essential hypertension 03/21/2016   GERD (gastroesophageal reflux disease)    History of blood transfusion    History of Clostridioides difficile infection    History of prostate cancer 03/21/2016   History of PSVT (paroxysmal supraventricular tachycardia)    HLD (hyperlipidemia)    HTN (hypertension)    Hx of CABG 2006   Hypercholesteremia 03/21/2016   Hypothyroidism    LAE (left atrial enlargement) 12/2018   Severe, Noted on ECHO   LVH (left ventricular hypertrophy) 12/2018   Mild, Noted on ECHO   Medication management 03/21/2016   Mitral annular calcification 12/2018   with mild MS, Noted on ECHO   Morbid obesity (HCC) 03/21/2016   Myocardial infarct (HCC)    OSA (obstructive sleep apnea) 03/21/2016   uses oxygen at night time   Pain in right ankle and joints of right foot 03/21/2016   Paronychia 03/21/2016   Phimosis    Pneumonia    Primary insomnia 03/21/2016   Prostate cancer (HCC) 2008   Pulmonary hypertension (HCC) 12/2018   Moderate, Noted on ECHO   Tobacco dependence 03/21/2016   Tricuspid regurgitation 12/2018   Mild, Noted on ECHO  Past Surgical History:  Procedure Laterality Date   ANKLE SURGERY Right 12/2013   CARDIAC CATHETERIZATION N/A 05/08/2016   Procedure: Left Heart Cath and Cors/Grafts Angiography;  Surgeon: Jeremiah Records, MD;  Location: [facility] INVASIVE CV LAB;  Service: Cardiovascular;  Laterality: N/A;   CIRCUMCISION N/A 10/07/2019   Procedure: CIRCUMCISION ADULT;  Surgeon: Jeremiah Fat, MD;  Location: [facility] ORS;  Service: Urology;  Laterality: N/A;   CORONARY ARTERY BYPASS GRAFT  2006   x3   ELECTROPHYSIOLOGIC STUDY N/A 08/24/2016   Procedure: SVT Ablation;  Surgeon: Jeremiah Pech Jorja Loa, MD;  Location: [facility] INVASIVE CV LAB;  Service: Cardiovascular;   Laterality: N/A;   PROSTATE SURGERY  2008   PTCA     UMBILICAL HERNIA REPAIR N/A 05/26/2022   Procedure: PRIMARY REPAIR OF STRANGULATED UMBILICAL HERNIA WITH PARTIAL OMENTECTOMY;  Surgeon: Jeremiah Lint, MD;  Location: [facility] OR;  Service: General;  Laterality: N/A;     Home Medications:  Prior to Admission medications   Medication Sig Start Date End Date Taking? Authorizing Provider  aspirin EC 81 MG tablet Take 1 tablet (81 mg total) by mouth daily. Swallow whole. 04/17/23  Yes Jeremiah Pyo, MD  budesonide (PULMICORT) 0.5 MG/2ML nebulizer solution Take 2 mLs (0.5 mg total) by nebulization 2 (two) times daily. 05/13/23  Yes Jeremiah Sinner, MD  colchicine 0.6 MG tablet Take 0.6 mg by mouth daily as needed (Gout).   Yes [provider]  dexlansoprazole (DEXILANT) 60 MG capsule Take 60 mg by mouth daily. 01/29/23  Yes [provider]  dicyclomine (BENTYL) 10 MG capsule Take 10 mg by mouth daily as needed for spasms. 02/19/23  Yes [provider]  ezetimibe (ZETIA) 10 MG tablet TAKE 1 TABLET(10 MG) BY MOUTH DAILY 08/01/23  Yes Hilty, Lisette Abu, MD  FEROSUL 325 (65 Fe) MG tablet TAKE 1 TABLET BY MOUTH DAILY WITH BREAKFAST 07/08/23  Yes Hilty, Lisette Abu, MD  fluticasone (FLONASE) 50 MCG/ACT nasal spray Place 1 spray into both nostrils daily. 06/26/23  Yes Hilty, Lisette Abu, MD  furosemide (LASIX) 40 MG tablet Take 2 tablets (80 mg total) by mouth daily. 06/11/23  Yes Jeremiah Cove, MD  glimepiride (AMARYL) 4 MG tablet Take 4 mg by mouth daily.   Yes [provider]  ketoconazole (NIZORAL) 2 % shampoo Apply 1 Application topically daily. 07/11/23  Yes [provider]  levothyroxine (SYNTHROID, LEVOTHROID) 88 MCG tablet Take 88 mcg by mouth daily before breakfast.   Yes [provider]  omeprazole (PRILOSEC) 20 MG capsule Take 20 mg by mouth daily. 03/24/23  Yes [provider]  Potassium Chloride ER 20 MEQ TBCR Take 1 tablet by mouth daily.  07/11/23  Yes [provider]  potassium chloride SA (KLOR-CON M) 20 MEQ tablet Take 1 tablet (20 mEq total) by mouth daily. 06/11/23  Yes Jeremiah Cove, MD  revefenacin [facility]) 175 MCG/3ML nebulizer solution Take 175 mcg by nebulization in the morning and at bedtime.   Yes [provider]  spironolactone (ALDACTONE) 25 MG tablet Take 1 tablet (25 mg total) by mouth daily. 06/12/23  Yes Jeremiah Cove, MD    Inpatient Medications: Scheduled Meds:  aspirin EC  81 mg Oral Daily   budesonide  0.5 mg Nebulization BID   ezetimibe  10 mg Oral Daily   [START ON 08/23/2023] ferrous sulfate  325 mg Oral Q breakfast   [START ON 08/23/2023] fluticasone  1 spray Each Nare Daily   furosemide  40 mg Intravenous  BID   heparin  5,000 Units Subcutaneous Q8H   insulin aspart  0-15 Units Subcutaneous Q4H   [START ON 08/23/2023] levothyroxine  88 mcg Oral QAC breakfast   pantoprazole  40 mg Oral Daily   revefenacin  175 mcg Nebulization QHS   sodium chloride flush  3 mL Intravenous Q12H   spironolactone  25 mg Oral Daily   Continuous Infusions:  sodium chloride     sodium chloride Stopped (08/22/23 0957)   DOPamine 5 mcg/kg/min (08/22/23 1133)   PRN Meds: sodium chloride, acetaminophen, dicyclomine, docusate sodium, polyethylene glycol, sodium chloride flush  Allergies:    Allergies  Allergen Reactions   Shellfish Allergy Shortness Of Breath, Nausea And Vomiting and Rash    Scallops     Fluzone [Influenza Virus Vaccine] Nausea And Vomiting, Palpitations and Rash    Social History:   Social History   Socioeconomic History   Marital status: Married    Spouse name: Not on file   Number of children: 0   Years of education: Not on file   Highest education level: Not on file  Occupational History   Occupation: retired - direct [employer]  Tobacco Use   Smoking status: Former    Current packs/day: 0.00    Average packs/day: 0.8 packs/day for 60.0 years (45.0 ttl pk-yrs)    Types:  Cigarettes    Start date: 12/15/1956    Quit date: 12/15/2016    Years since quitting: 6.6   Smokeless tobacco: Never  Vaping Use   Vaping status: Never Used  Substance and Sexual Activity   Alcohol use: Not Currently    Comment: very seldom   Drug use: No   Sexual activity: Not on file  Other Topics Concern   Not on file  Social History Narrative   Retired, married, no children      epworth sleepiness scale = 9 (04/13/16) (has OSA)   Social Determinants of Health   Financial Resource Strain: Not on file  Food Insecurity: No Food Insecurity (06/05/2023)   Hunger Vital Sign    Worried About Running Out of Food in the Last Year: Never true    Ran Out of Food in the Last Year: Never true  Transportation Needs: No Transportation Needs (06/05/2023)   PRAPARE - Administrator, Civil Service (Medical): No    Lack of Transportation (Non-Medical): No  Physical Activity: Not on file  Stress: Not on file  Social Connections: Not on file  Intimate Partner Violence: Not At Risk (06/05/2023)   Humiliation, Afraid, Rape, and Kick questionnaire    Fear of Current or Ex-Partner: No    Emotionally Abused: No    Physically Abused: No    Sexually Abused: No    Family History:   Family History  Problem Relation Age of Onset   Dementia Mother    Diabetes Sister      ROS:  Please see the history of present illness.   All other ROS reviewed and negative.     Physical Exam/Data:   Vitals:   08/22/23 1100 08/22/23 1115 08/22/23 1130 08/22/23 1212  BP: (!) 132/44 (!) 124/42 (!) 131/46   Pulse: 60 61 64   Resp: (!) 21 (!) 21 (!) 24   Temp:    98.5 F (36.9 C)  TempSrc:    Oral  SpO2: 96% 95% 93%   Weight:        Intake/Output Summary (Last 24 hours) at 08/22/2023 1421 Last data filed at 08/22/2023  1245 Gross per 24 hour  Intake 12.77 ml  Output 200 ml  Net -187.23 ml      08/22/2023    6:01 AM 08/13/2023    9:03 AM 08/08/2023    9:15 AM  Last 3 Weights  Weight (lbs) 207  lb 3.7 oz 205 lb 204 lb 12.8 oz  Weight (kg) 94 kg 92.987 kg 92.897 kg     Body mass index is 31.51 kg/m.  General:  Well nourished, well developed, in no acute distress HEENT: normal Neck: no JVD Vascular: No carotid bruits; Distal pulses 2+ bilaterally Cardiac:  RRR; 2/6 DM/SM Lungs:  decreased at the bases, no wheezing, rhonchi or rales  Abd: soft, nontender  Ext: no edema Musculoskeletal:  No deformities Skin: warm and dry  Neuro: no focal abnormalities noted Psych:  Normal affect   EKG:  The EKG was personally reviewed and demonstrates:    CHB 42bpm, narrow QRS 96ms, inf/lat T changes CHB 40bpm, unchanged CHB 45bpm, unchanged   Telemetry:  Telemetry was personally reviewed and demonstrates:   2:1 AVblock 60's, same narrow QRS, blocked PACs as well  Relevant CV Studies:   06/25/23: TTE 1. Left ventricular ejection fraction, by estimation, is 55 to 60%. The  left ventricle has normal function. The left ventricle has no regional  wall motion abnormalities. There is moderate asymmetric left ventricular  hypertrophy of the basal-septal  segment. Left ventricular diastolic parameters are consistent with Grade I  diastolic dysfunction (impaired relaxation).   2. Right ventricular systolic function is normal. The right ventricular  size is normal. There is normal pulmonary artery systolic pressure.   3. Left atrial size was severely dilated.   4. The mitral valve is degenerative. Trivial mitral valve regurgitation.  Moderate mitral stenosis with mean gradient 9 mmHg and MVA by VTI 1.77  cm^2. Moderate to severe mitral annular calcification. Severe mitral  valvular calcification.   5. There is calcification in the LV outflow tract extending anteriorly  from the mitral valve. The aortic valve is functionally bicuspid with  fusion of the non- and left-coronary cusps. There is moderate  calcification of the aortic valve. No aortic  insufficiency. There is at least mild aortic  stenosis, mean gradient 10  mmHg.   6. Aortic dilatation noted. There is mild dilatation of the ascending  aorta, measuring 39 mm.   7. The inferior vena cava is normal in size with greater than 50%  respiratory variability, suggesting right atrial pressure of 3 mmHg.   03/09/2020: stress myoview 1. Fixed mild apical inferior perfusion defect.  Normal wall motion in this area suggests likely artifact 2. Fixed moderate mid to basal inferolateral perfusion defect.  Normal wall motion in this area suggests likely artifact 3. No ischemia   05/08/2016: LHC LM lesion, 85% stenosed. Ost 1st Mrg lesion, 100% stenosed. SVG was injected . Ost RCA to Prox RCA lesion, 100% stenosed. SVG . Origin to Prox Graft lesion, 40% stenosed. Mid RCA to Dist RCA lesion, 100% stenosed. Prox LAD lesion, 100% stenosed. LIMA .   Patent saphenous vein grafts. SVG to PDA contains eccentric 40% proximal stenosis. SVG to the circumflex is widely patent. LIMA to the LAD is widely patent. Native distal left main contains 75% stenosis. The dominant obtuse marginal branch is totally occluded. The native right coronary is totally occluded. Left ventriculogram demonstrates inferior wall hypokinesis. EF 50%. Recent chest pain in the setting of PSVT with minimal enzyme elevation likely related to demand ischemia in the  setting of underlying native coronary disease.  Laboratory Data:  High Sensitivity Troponin:   Recent Labs  Lab 08/22/23 0607 08/22/23 0845  TROPONINIHS 41* 55*     Chemistry Recent Labs  Lab 08/22/23 0607 08/22/23 1014  NA 135  --   K 4.2  --   CL 99  --   CO2 23  --   GLUCOSE 154*  --   BUN 38*  --   CREATININE 1.58* 1.43*  CALCIUM 9.1  --   GFRNONAA 45* 51*  ANIONGAP 13  --     Recent Labs  Lab 08/22/23 0607  PROT 7.3  ALBUMIN 3.6  AST 19  ALT 16  ALKPHOS 54  BILITOT 1.3*   Lipids No results for input(s): "CHOL", "TRIG", "HDL", "LABVLDL", "LDLCALC", "CHOLHDL" in the last 168  hours.  Hematology Recent Labs  Lab 08/22/23 0607 08/22/23 0845 08/22/23 1014  WBC 7.5 7.9 7.5  RBC 3.33* 3.07* 3.07*  HGB 9.8* 9.4* 9.4*  HCT 32.0* 29.8* 29.4*  MCV 96.1 97.1 95.8  MCH 29.4 30.6 30.6  MCHC 30.6 31.5 32.0  RDW 16.2* 16.5* 16.3*  PLT 170 156 141*   Thyroid No results for input(s): "TSH", "FREET4" in the last 168 hours.  BNP Recent Labs  Lab 08/22/23 0607  BNP 898.5*    DDimer No results for input(s): "DDIMER" in the last 168 hours.   Radiology/Studies:  DG Chest Portable 1 View  Result Date: 08/22/2023 CLINICAL DATA:  77 year old male with shortness of breath. EXAM: PORTABLE CHEST 1 VIEW COMPARISON:  Portable chest 06/05/2023 and earlier. FINDINGS: Portable AP semi upright view at 0631 hours. Chronic sternotomy. Stable lung volumes and mediastinal contours. Evidence of chronic pulmonary hyperinflation. No pneumothorax, pulmonary edema or consolidation. Difficult to exclude trace left pleural effusion. But no other confluent lung opacity. No acute osseous abnormality identified. Negative visible bowel gas. IMPRESSION: Possible trace left pleural effusion but no other acute cardiopulmonary abnormality. Electronically Signed   By: Odessa Fleming M.D.   On: 08/22/2023 07:17     Assessment and Plan:   CHB No reversible causes Better on dopamine VSS  Karaline Buresh ned PPM Discussed with the patient rational, the procedure with potential risks/benefits He is agreeable Discussed given his co morbid conditions, likely would not use sedation, he is aware.   Otherwise as per attending cardiology/CCM    Risk Assessment/Risk Scores:    For questions or updates, please contact [facility] Please consult [email or web address] for contact info under    Signed, Sheilah Pigeon, PA-C  08/22/2023 2:21 PM  I have seen and examined this patient with Francis Dowse.  Agree with above, note added to reflect my findings.  Patient with a past medical history as above.  He presented  to the hospital with increasing shortness of breath, fatigue, weakness.  His primary physician found him to be in bradycardic and he was referred to the emergency room.  In the emergency room he was found to be in complete heart block.  He has since been started on dopamine with improvement in his heart rate and AV conduction.  He feels improved with less fatigue and shortness of breath.  GEN: Well nourished, well developed, in no acute distress  HEENT: normal  Neck: no JVD, carotid bruits, or masses Cardiac: RRR; no murmurs, rubs, or gallops,no edema  Respiratory:  clear to auscultation bilaterally, normal work of breathing GI: soft, nontender, nondistended, + BS MS: no deformity or atrophy  Skin: warm and dry  Neuro:  Strength and sensation are intact Psych: euthymic mood, full affect   Complete heart block: At this point no reversible causes.  On 8 no AV nodal blockers.  Gregori Abril need pacemaker implant.  Shadell Brenn plan for tomorrow.  Continue dopamine for rhythm control.  Korrine Sicard M. Demarrius Guerrero MD 08/22/2023 4:08 PM

## 2023-08-23 ENCOUNTER — Other Ambulatory Visit: Payer: Self-pay

## 2023-08-23 ENCOUNTER — Encounter (HOSPITAL_COMMUNITY)
Admission: EM | Disposition: A | Discharge status: Home / Self Care | Payer: Self-pay | Source: Home / Self Care | Attending: Internal Medicine

## 2023-08-23 ENCOUNTER — Encounter (HOSPITAL_COMMUNITY): Payer: Self-pay | Admitting: Cardiology

## 2023-08-23 DIAGNOSIS — R570 Cardiogenic shock: Secondary | ICD-10-CM | POA: Diagnosis not present

## 2023-08-23 DIAGNOSIS — I5043 Acute on chronic combined systolic (congestive) and diastolic (congestive) heart failure: Secondary | ICD-10-CM | POA: Diagnosis not present

## 2023-08-23 DIAGNOSIS — Z951 Presence of aortocoronary bypass graft: Secondary | ICD-10-CM

## 2023-08-23 DIAGNOSIS — G9349 Other encephalopathy: Secondary | ICD-10-CM | POA: Diagnosis not present

## 2023-08-23 DIAGNOSIS — I442 Atrioventricular block, complete: Secondary | ICD-10-CM | POA: Diagnosis not present

## 2023-08-23 DIAGNOSIS — I342 Nonrheumatic mitral (valve) stenosis: Secondary | ICD-10-CM

## 2023-08-23 DIAGNOSIS — I350 Nonrheumatic aortic (valve) stenosis: Secondary | ICD-10-CM

## 2023-08-23 HISTORY — PX: PACEMAKER IMPLANT: EP1218

## 2023-08-23 LAB — BASIC METABOLIC PANEL
Anion gap: 13 (ref 5–15)
BUN: 25 mg/dL — ABNORMAL HIGH (ref 8–23)
CO2: 24 mmol/L (ref 22–32)
Calcium: 8.8 mg/dL — ABNORMAL LOW (ref 8.9–10.3)
Chloride: 100 mmol/L (ref 98–111)
Creatinine, Ser: 1.18 mg/dL (ref 0.61–1.24)
GFR, Estimated: >60 mL/min (ref >60)
Glucose, Bld: 148 mg/dL — ABNORMAL HIGH (ref 70–99)
Potassium: 3.8 mmol/L (ref 3.5–5.1)
Sodium: 137 mmol/L (ref 135–145)

## 2023-08-23 LAB — GLUCOSE, CAPILLARY
Glucose-Capillary: 109 mg/dL — ABNORMAL HIGH (ref 70–99)
Glucose-Capillary: 126 mg/dL — ABNORMAL HIGH (ref 70–99)
Glucose-Capillary: 144 mg/dL — ABNORMAL HIGH (ref 70–99)
Glucose-Capillary: 155 mg/dL — ABNORMAL HIGH (ref 70–99)
Glucose-Capillary: 166 mg/dL — ABNORMAL HIGH (ref 70–99)

## 2023-08-23 LAB — TSH: TSH: 0.55 u[IU]/mL (ref 0.350–4.500)

## 2023-08-23 LAB — MAGNESIUM: Magnesium: 2.2 mg/dL (ref 1.7–2.4)

## 2023-08-23 SURGERY — PACEMAKER IMPLANT
Anesthesia: LOCAL

## 2023-08-23 MED ORDER — CEFAZOLIN SODIUM-DEXTROSE 2-4 GM/100ML-% IV SOLN
INTRAVENOUS | Status: AC
  Filled 2023-08-23: qty 100

## 2023-08-23 MED ORDER — CEFAZOLIN SODIUM-DEXTROSE 1-4 GM/50ML-% IV SOLN
1.0000 g | Freq: Four times a day (QID) | INTRAVENOUS | Status: AC
  Administered 2023-08-23 – 2023-08-24 (×3): 1 g via INTRAVENOUS
  Filled 2023-08-23 (×3): qty 50

## 2023-08-23 MED ORDER — LIDOCAINE HCL (PF) 1 % IJ SOLN
INTRAMUSCULAR | Status: DC | PRN
  Administered 2023-08-23: 60 mL

## 2023-08-23 MED ORDER — FENTANYL CITRATE (PF) 100 MCG/2ML IJ SOLN
INTRAMUSCULAR | Status: DC | PRN
  Administered 2023-08-23: 25 ug via INTRAVENOUS

## 2023-08-23 MED ORDER — LIDOCAINE HCL (PF) 1 % IJ SOLN
INTRAMUSCULAR | Status: AC
  Filled 2023-08-23: qty 60

## 2023-08-23 MED ORDER — HEPARIN (PORCINE) IN NACL 1000-0.9 UT/500ML-% IV SOLN
INTRAVENOUS | Status: DC | PRN
  Administered 2023-08-23: 500 mL

## 2023-08-23 MED ORDER — SODIUM CHLORIDE 0.9 % IV SOLN
INTRAVENOUS | Status: AC
  Filled 2023-08-23: qty 2

## 2023-08-23 MED ORDER — ONDANSETRON HCL 4 MG/2ML IJ SOLN: 4.0000 mg | Freq: Four times a day (QID) | INTRAMUSCULAR | Status: DC | PRN

## 2023-08-23 MED ORDER — POTASSIUM CHLORIDE CRYS ER 20 MEQ PO TBCR
20.0000 meq | EXTENDED_RELEASE_TABLET | Freq: Once | ORAL | Status: AC
  Administered 2023-08-23: 20 meq via ORAL
  Filled 2023-08-23: qty 1

## 2023-08-23 MED ORDER — FENTANYL CITRATE (PF) 100 MCG/2ML IJ SOLN
INTRAMUSCULAR | Status: AC
  Filled 2023-08-23: qty 2

## 2023-08-23 MED ORDER — MIDAZOLAM HCL 5 MG/5ML IJ SOLN
INTRAMUSCULAR | Status: DC | PRN
  Administered 2023-08-23: 1 mg via INTRAVENOUS

## 2023-08-23 MED ORDER — MIDAZOLAM HCL 5 MG/5ML IJ SOLN
INTRAMUSCULAR | Status: AC
  Filled 2023-08-23: qty 5

## 2023-08-23 SURGICAL SUPPLY — 13 items
CABLE SURGICAL S-101-97-12 (CABLE) ×1 IMPLANT
CATH CPS LOCATOR 3D MED (CATHETERS) IMPLANT
HELIX LOCKING TOOL (MISCELLANEOUS) ×1 IMPLANT
LEAD ULTIPACE 52 LPA1231/52 (Lead) IMPLANT
LEAD ULTIPACE 65 LPA1231/65 (Lead) IMPLANT
PACEMAKER ASSURITY DR-RF (Pacemaker) IMPLANT
PAD DEFIB RADIO PHYSIO CONN (PAD) ×1 IMPLANT
SHEATH 7FR PRELUDE SNAP 13 (SHEATH) IMPLANT
SHEATH 9FR PRELUDE SNAP 13 (SHEATH) IMPLANT
SLITTER AGILIS HISPRO (INSTRUMENTS) IMPLANT
TOOL HELIX LOCKING (MISCELLANEOUS) IMPLANT
TRAY PACEMAKER INSERTION (PACKS) ×1 IMPLANT
WIRE HI TORQ VERSACORE-J 145CM (WIRE) IMPLANT

## 2023-08-23 NOTE — Progress Notes (Signed)
DAILY PROGRESS NOTE   Patient Name: Jeremiah [name] Date of Encounter: 08/23/2023 Cardiologist: Chrystie Nose, MD  Chief Complaint   Feels great  Patient Profile   77 yo male with history of CAD status post three-vessel CABG in 2006, COPD, PAD, recurrent SVT status post ablation, new onset atrial fibrillation, moderate aortic stenosis and severe mitral stenosis, not anticoagulated due to recent GI bleeding and not considered a candidate for watchman, now presents with progressive dyspnea and fatigue and found to be in complete heart block.   Subjective   No complaints overnight. Feels like he could go home - on dopamine, alternating between sinus bradycardia and heart block. Plan for PPM today. Diuresed about 1300 ml, but around net even. Weight not reliable (shows 4kg increase).   Objective   Vitals:   08/23/23 0500 08/23/23 0600 08/23/23 0700 08/23/23 0757  BP: (!) 138/47 (!) 131/42 (!) 141/42   Pulse: (!) 52 (!) 49 (!) 56   Resp: 18 17 20    Temp:    98.3 F (36.8 C)  TempSrc:   Oral Oral  SpO2: 96% 96% 97%   Weight: 98.1 kg       Intake/Output Summary (Last 24 hours) at 08/23/2023 1610 Last data filed at 08/23/2023 0700 Gross per 24 hour  Intake 1435.19 ml  Output 1300 ml  Net 135.19 ml   Filed Weights   08/22/23 0601 08/23/23 0500  Weight: 94 kg 98.1 kg    Physical Exam   General appearance: alert, no distress, and mildly obese Neck: no carotid bruit, no JVD, and thyroid not enlarged, symmetric, no tenderness/mass/nodules Lungs: clear to auscultation bilaterally Heart: regular rate and rhythm Abdomen: soft, non-tender; bowel sounds normal; no masses,  no organomegaly Extremities: edema trace pedal Pulses: 2+ and symmetric Skin: Skin color, texture, turgor normal. No rashes or lesions Neurologic: Grossly normal Psych: Pleasant  Inpatient Medications    Scheduled Meds:  aspirin EC  81 mg Oral Daily   budesonide  0.5 mg Nebulization BID   chlorhexidine   60 mL Topical Once   Chlorhexidine Gluconate Cloth  6 each Topical Daily   ezetimibe  10 mg Oral Daily   ferrous sulfate  325 mg Oral Q breakfast   fluticasone  1 spray Each Nare Daily   furosemide  40 mg Intravenous BID   gentamicin (GARAMYCIN) 80 mg in sodium chloride 0.9 % 500 mL irrigation  80 mg Irrigation On Call   insulin aspart  0-15 Units Subcutaneous Q4H   levothyroxine  88 mcg Oral QAC breakfast   pantoprazole  40 mg Oral Daily   potassium chloride  20 mEq Oral Once   revefenacin  175 mcg Nebulization QHS   sodium chloride flush  3 mL Intravenous Q12H   sodium chloride flush  3 mL Intravenous Q12H   spironolactone  25 mg Oral Daily    Continuous Infusions:  sodium chloride     sodium chloride 50 mL/hr at 08/23/23 0700   sodium chloride     sodium chloride      ceFAZolin (ANCEF) IV     DOPamine 7.5 mcg/kg/min (08/23/23 0700)    PRN Meds: sodium chloride, acetaminophen, dicyclomine, docusate sodium, polyethylene glycol, sodium chloride flush, sodium chloride flush   Labs   Results for orders placed or performed during the hospital encounter of 08/22/23 (from the past 48 hour(s))  CBC with Differential     Status: Abnormal   Collection Time: 08/22/23  6:07 AM  Result Value  Ref Range   WBC 7.5 4.0 - 10.5 K/uL   RBC 3.33 (L) 4.22 - 5.81 MIL/uL   Hemoglobin 9.8 (L) 13.0 - 17.0 g/dL   HCT 24.4 (L) 01.0 - 27.2 %   MCV 96.1 80.0 - 100.0 fL   MCH 29.4 26.0 - 34.0 pg   MCHC 30.6 30.0 - 36.0 g/dL   RDW 53.6 (H) 64.4 - 03.4 %   Platelets 170 150 - 400 K/uL   nRBC 0.0 0.0 - 0.2 %   Neutrophils Relative % 73 %   Neutro Abs 5.5 1.7 - 7.7 K/uL   Lymphocytes Relative 14 %   Lymphs Abs 1.0 0.7 - 4.0 K/uL   Monocytes Relative 12 %   Monocytes Absolute 0.9 0.1 - 1.0 K/uL   Eosinophils Relative 0 %   Eosinophils Absolute 0.0 0.0 - 0.5 K/uL   Basophils Relative 0 %   Basophils Absolute 0.0 0.0 - 0.1 K/uL   Immature Granulocytes 1 %   Abs Immature Granulocytes 0.05 0.00 -  0.07 K/uL    Comment: Performed at [facility], [address] [place], [place] [address]  Comprehensive metabolic panel     Status: Abnormal   Collection Time: 08/22/23  6:07 AM  Result Value Ref Range   Sodium 135 135 - 145 mmol/L   Potassium 4.2 3.5 - 5.1 mmol/L   Chloride 99 98 - 111 mmol/L   CO2 23 22 - 32 mmol/L   Glucose, Bld 154 (H) 70 - 99 mg/dL    Comment: Glucose reference range applies only to samples taken after fasting for at least 8 hours.   BUN 38 (H) 8 - 23 mg/dL   Creatinine, Ser 5.63 (H) 0.61 - 1.24 mg/dL   Calcium 9.1 8.9 - 87.5 mg/dL   Total Protein 7.3 6.5 - 8.1 g/dL   Albumin 3.6 3.5 - 5.0 g/dL   AST 19 15 - 41 U/L   ALT 16 0 - 44 U/L   Alkaline Phosphatase 54 38 - 126 U/L   Total Bilirubin 1.3 (H) 0.3 - 1.2 mg/dL   GFR, Estimated 45 (L) >60 mL/min    Comment: (NOTE) Calculated using the CKD-EPI Creatinine Equation (2021)    Anion gap 13 5 - 15    Comment: Performed at [facility], [address], [place], [place] [address]  Troponin I (High Sensitivity)     Status: Abnormal   Collection Time: 08/22/23  6:07 AM  Result Value Ref Range   Troponin I (High Sensitivity) 41 (H) <18 ng/L    Comment: (NOTE) Elevated high sensitivity troponin I (hsTnI) values and significant  changes across serial measurements may suggest ACS but many other  chronic and acute conditions are known to elevate hsTnI results.  Refer to the "Links" section for chest pain algorithms and additional  guidance. Performed at [facility], [address], [place], [place] [address]   Brain natriuretic peptide     Status: Abnormal   Collection Time: 08/22/23  6:07 AM  Result Value Ref Range   B Natriuretic Peptide 898.5 (H) 0.0 - 100.0 pg/mL    Comment: Performed at [facility], [address], [place], [place] [address]  Troponin I (High Sensitivity)     Status: Abnormal   Collection Time: 08/22/23  8:45 AM  Result Value Ref Range   Troponin I (High  Sensitivity) 55 (H) <18 ng/L    Comment: (NOTE) Elevated high sensitivity troponin I (hsTnI)  values and significant  changes across serial measurements may suggest ACS but many other  chronic and acute conditions are known to elevate hsTnI results.  Refer to the "Links" section for chest pain algorithms and additional  guidance. Performed at [facility], [address], [place], [place] [address]   CBC     Status: Abnormal   Collection Time: 08/22/23  8:45 AM  Result Value Ref Range   WBC 7.9 4.0 - 10.5 K/uL   RBC 3.07 (L) 4.22 - 5.81 MIL/uL   Hemoglobin 9.4 (L) 13.0 - 17.0 g/dL   HCT 96.2 (L) 95.2 - 84.1 %   MCV 97.1 80.0 - 100.0 fL   MCH 30.6 26.0 - 34.0 pg   MCHC 31.5 30.0 - 36.0 g/dL   RDW 32.4 (H) 40.1 - 02.7 %   Platelets 156 150 - 400 K/uL   nRBC 0.0 0.0 - 0.2 %    Comment: Performed at [facility], [address] [place], [place] [address]  I-Stat CG4 Lactic Acid     Status: None   Collection Time: 08/22/23  8:52 AM  Result Value Ref Range   Lactic Acid, Venous 1.5 0.5 - 1.9 mmol/L  MRSA Next Gen by PCR, Nasal     Status: None   Collection Time: 08/22/23  9:25 AM   Specimen: Nasal Mucosa; Nasal Swab  Result Value Ref Range   MRSA by PCR Next Gen NOT DETECTED NOT DETECTED    Comment: (NOTE) The GeneXpert MRSA Assay (FDA approved for NASAL specimens only), is one component of a comprehensive MRSA colonization surveillance program. It is not intended to diagnose MRSA infection nor to guide or monitor treatment for MRSA infections. Test performance is not FDA approved in patients less than 4 years old. Performed at [facility], [address], [place], [place] [address]   Hemoglobin A1c     Status: Abnormal   Collection Time: 08/22/23 10:14 AM  Result Value Ref Range   Hgb A1c MFr Bld 5.9 (H) 4.8 - 5.6 %    Comment: (NOTE) Pre diabetes:          5.7%-6.4%  Diabetes:              >6.4%  Glycemic control for   <7.0% adults with diabetes     Mean Plasma Glucose 122.63 mg/dL    Comment: Performed at [facility], [address] [place], [place] [address]  CBC     Status: Abnormal   Collection Time: 08/22/23 10:14 AM  Result Value Ref Range   WBC 7.5 4.0 - 10.5 K/uL   RBC 3.07 (L) 4.22 - 5.81 MIL/uL   Hemoglobin 9.4 (L) 13.0 - 17.0 g/dL   HCT 56.3 (L) 87.5 - 64.3 %   MCV 95.8 80.0 - 100.0 fL   MCH 30.6 26.0 - 34.0 pg   MCHC 32.0 30.0 - 36.0 g/dL   RDW 32.9 (H) 51.8 - 84.1 %   Platelets 141 (L) 150 - 400 K/uL   nRBC 0.0 0.0 - 0.2 %    Comment: Performed at [facility], [address], [place], [place] [address]  Creatinine, serum     Status: Abnormal   Collection Time: 08/22/23 10:14 AM  Result Value Ref Range   Creatinine, Ser 1.43 (H) 0.61 - 1.24 mg/dL   GFR, Estimated 51 (L) >60 mL/min    Comment: (NOTE) Calculated using the CKD-EPI Creatinine Equation (2021) Performed at [facility], [address], [place], [place] [address]   Glucose, capillary     Status: Abnormal   Collection Time: 08/22/23 10:17 AM  Result Value Ref Range   Glucose-Capillary 163 (H) 70 - 99 mg/dL    Comment: Glucose reference range applies only to samples taken after fasting for at least 8 hours.  Glucose, capillary     Status: Abnormal   Collection Time: 08/22/23 12:09 PM  Result Value Ref Range   Glucose-Capillary 167 (H) 70 - 99 mg/dL    Comment: Glucose reference range applies only to samples taken after fasting for at least 8 hours.  Surgical PCR screen     Status: None   Collection Time: 08/22/23  3:57 PM   Specimen: Nasal Mucosa; Nasal Swab  Result Value Ref Range   MRSA, PCR NEGATIVE NEGATIVE   Staphylococcus aureus NEGATIVE NEGATIVE    Comment: (NOTE) The Xpert SA Assay (FDA approved for NASAL specimens in patients 1 years of age and older), is one component of a comprehensive surveillance program. It is not intended to diagnose infection nor to guide or monitor treatment. Performed at [facility], [address] [place], [place] [address]   Glucose, capillary     Status: Abnormal   Collection Time: 08/22/23  5:20 PM  Result Value Ref Range   Glucose-Capillary 164 (H) 70 - 99 mg/dL    Comment: Glucose reference range applies only to samples taken after fasting for at least 8 hours.  Glucose, capillary     Status: Abnormal   Collection Time: 08/22/23  7:51 PM  Result Value Ref Range   Glucose-Capillary 148 (H) 70 - 99 mg/dL    Comment: Glucose reference range applies only to samples taken after fasting for at least 8 hours.  Glucose, capillary     Status: Abnormal   Collection Time: 08/22/23 11:44 PM  Result Value Ref Range   Glucose-Capillary 154 (H) 70 - 99 mg/dL    Comment: Glucose reference range applies only to samples taken after fasting for at least 8 hours.  Basic metabolic panel     Status: Abnormal   Collection Time: 08/23/23  2:04 AM  Result Value Ref Range   Sodium 137 135 - 145 mmol/L   Potassium 3.8 3.5 - 5.1 mmol/L   Chloride 100 98 - 111 mmol/L   CO2 24 22 - 32 mmol/L   Glucose, Bld 148 (H) 70 - 99 mg/dL    Comment: Glucose reference range applies only to samples taken after fasting for at least 8 hours.   BUN 25 (H) 8 - 23 mg/dL   Creatinine, Ser 7.82 0.61 - 1.24 mg/dL   Calcium 8.8 (L) 8.9 - 10.3 mg/dL   GFR, Estimated >95 >62 mL/min    Comment: (NOTE) Calculated using the CKD-EPI Creatinine Equation (2021)    Anion gap 13 5 - 15    Comment: Performed at [facility], [address], [place], [place] [address]  TSH     Status: None   Collection Time: 08/23/23  2:04 AM  Result Value Ref Range   TSH 0.550 0.350 - 4.500 uIU/mL    Comment: Performed by a 3rd Generation assay with a functional sensitivity of <=0.01 uIU/mL. Performed at [facility], [address], [place], [place] [address]   Glucose, capillary     Status: Abnormal   Collection Time: 08/23/23  3:51 AM  Result Value Ref Range   Glucose-Capillary 126 (  H) 70 - 99  mg/dL    Comment: Glucose reference range applies only to samples taken after fasting for at least 8 hours.  Glucose, capillary     Status: Abnormal   Collection Time: 08/23/23  7:54 AM  Result Value Ref Range   Glucose-Capillary 166 (H) 70 - 99 mg/dL    Comment: Glucose reference range applies only to samples taken after fasting for at least 8 hours.    ECG   Sinus bradycardia with 1st degree AVB - Personally Reviewed  Telemetry   Sinus bradycardia with high-degree AV Block - Personally Reviewed  Radiology    DG Chest Portable 1 View  Result Date: 08/22/2023 CLINICAL DATA:  77 year old male with shortness of breath. EXAM: PORTABLE CHEST 1 VIEW COMPARISON:  Portable chest 06/05/2023 and earlier. FINDINGS: Portable AP semi upright view at 0631 hours. Chronic sternotomy. Stable lung volumes and mediastinal contours. Evidence of chronic pulmonary hyperinflation. No pneumothorax, pulmonary edema or consolidation. Difficult to exclude trace left pleural effusion. But no other confluent lung opacity. No acute osseous abnormality identified. Negative visible bowel gas. IMPRESSION: Possible trace left pleural effusion but no other acute cardiopulmonary abnormality. Electronically Signed   By: Odessa Fleming M.D.   On: 08/22/2023 07:17    Cardiac Studies   N/A  Assessment   Principal Problem:   Complete heart block (HCC) Active Problems:   S/P CABG x 3   Essential hypertension   COPD GOLD II with reversibility   Coronary artery disease involving coronary bypass graft of native heart without angina pectoris   Type 2 diabetes mellitus with hyperglycemia, without long-term current use of insulin (HCC)   Mitral stenosis   Acute on chronic diastolic CHF (congestive heart failure) (HCC)   Aortic stenosis   Heart block   Cardiogenic shock (HCC)   Plan   Alternating between sinus and high degree AVB on dopamine. For PPM today. Good diuresis - will continue today.  Creatinine improved from 1.43  to 1.18. Recent LVEF normal on echo in 06/2023. Can stop dopamine after PPM - hopeful to d/c home tomorrow.  Time Spent Directly with Patient:  I have spent a total of 25 minutes with the patient reviewing hospital notes, telemetry, EKGs, labs and examining the patient as well as establishing an assessment and plan that was discussed personally with the patient.  > 50% of time was spent in direct patient care.  Length of Stay:  LOS: 1 day   Chrystie Nose, MD, [facility], FACP  [place]  [facility]  Medical Director of the Advanced Lipid Disorders &  Cardiovascular Risk Reduction Clinic Diplomate of the American Board of Clinical Lipidology Attending Cardiologist  Direct Dial: [phone]  Fax: [phone]  Website:  [email or web address] Kaulana Brindle 08/23/2023, 8:24 AM

## 2023-08-23 NOTE — Progress Notes (Signed)
Heart Failure Navigator Progress Note  Assessed for Heart & Vascular TOC clinic readiness.  Patient EF 55-60%, has a scheduled [facility] follow up on 09/27/2023. .   Navigator will sign off at this time.   Rhae Hammock, BSN, Scientist, clinical (histocompatibility and immunogenetics) Only

## 2023-08-23 NOTE — Progress Notes (Signed)
NAME:  Jeremiah [name], MRN:  [id number], DOB:  June 06, 1946, LOS: 1 ADMISSION DATE:  08/22/2023, CONSULTATION DATE: 08/22/2023 REFERRING MD: Cardiology, CHIEF COMPLAINT: Shortness of breath, new onset of heart block, symptomatic bradycardia  History of Present Illness:  Jeremiah [name] 77 year old male with an extensive past medical history that is well-documented below.  He has spent the last 2 months in and out of [facility] with GI bleed, valvular disease, reported stents of the femoral arteries.  He presents to [facility] with worsening shortness of breath although he is followed by Dr. Sherene Sires and is O2 dependent at home.  He is found to be in heart block with a ventricular rate of 30-40 but was systolic blood pressure greater than 90.  He is awake alert following commands moves all extremities and does not appear to be in acute distress at this time.  He does have an external pacemaker system in place the plan is to utilize dopamine and as a transition to implanted pacemaker.  Pulmonary critical care asked to evaluate.  Pertinent  Medical History   Past Medical History:  Diagnosis Date   A-fib (HCC)    Acute on chronic systolic (congestive) heart failure (HCC) 06/10/2017   Acute pulmonary edema (HCC)    Anemia    Aortic valve sclerosis 12/2018   Noted on ECHO   Arthritis    CAD (coronary artery disease)    a. 2006: CABG in 2006 with LIMA-LAD, SVG-OM1, and SVG-RPDA   Cardiomegaly 12/2018   Stable, noted on CXR   Carpal tunnel syndrome    Right   Chronic pain 03/21/2016   Colon cancer (HCC) 2006   COPD (chronic obstructive pulmonary disease) (HCC)    Diabetes mellitus without complication (HCC)    DVT (deep venous thrombosis) (HCC)    Right   Essential hypertension 03/21/2016   GERD (gastroesophageal reflux disease)    History of blood transfusion    History of Clostridioides difficile infection    History of prostate cancer 03/21/2016   History of PSVT (paroxysmal  supraventricular tachycardia)    HLD (hyperlipidemia)    HTN (hypertension)    Hx of CABG 2006   Hypercholesteremia 03/21/2016   Hypothyroidism    LAE (left atrial enlargement) 12/2018   Severe, Noted on ECHO   LVH (left ventricular hypertrophy) 12/2018   Mild, Noted on ECHO   Medication management 03/21/2016   Mitral annular calcification 12/2018   with mild MS, Noted on ECHO   Morbid obesity (HCC) 03/21/2016   Myocardial infarct (HCC)    OSA (obstructive sleep apnea) 03/21/2016   uses oxygen at night time   Pain in right ankle and joints of right foot 03/21/2016   Paronychia 03/21/2016   Phimosis    Pneumonia    Primary insomnia 03/21/2016   Prostate cancer (HCC) 2008   Pulmonary hypertension (HCC) 12/2018   Moderate, Noted on ECHO   Tobacco dependence 03/21/2016   Tricuspid regurgitation 12/2018   Mild, Noted on ECHO     Significant Hospital Events: Including procedures, antibiotic start and stop dates in addition to other pertinent events     Interim History / Subjective:  Improved on dopamine.  Objective   Blood pressure (!) 141/42, pulse (!) 56, temperature 98.3 F (36.8 C), temperature source Oral, resp. rate 20, weight 98.1 kg, SpO2 97%.    FiO2 (%):  [4 %] 4 %   Intake/Output Summary (Last 24 hours) at 08/23/2023 4098 Last data filed at 08/23/2023 0700  Gross per 24 hour  Intake 1435.19 ml  Output 1300 ml  Net 135.19 ml   Filed Weights   08/22/23 0601 08/23/23 0500  Weight: 94 kg 98.1 kg    Examination: No distress MMM, trachea midline Moves to command Abd soft Aox3 Heart sounds regular, ext warm Lungs diminished, no accessory muscle use  No CBC K being repleted No new imaging  Resolved Hospital Problem list     Assessment & Plan:  Symptomatic third-degree heart block with plans for pacemaker per cardiology COPD followed by Dr. Sherene Sires former smoker quit 6 years ago' Chronic O2 needs- 2-4LPM Extensive cardiac history including coronary  bypass graft x 3, femoral stents, history of atrial fibrillation ventricular response status post ablation.Marland Kitchen Extensive GI history of GI bleed acute blood loss anemia.- precluding AC, s/p ablation Diabetes- A1c is good AKI and encephalopathy- due to low flow state, improved with dopamine Mild hypokalemia- repleted Anemia- chronic, check AM CBC  - Dopamine at 7.5 mcg/kg/min, titrate for HR > 60 or signs of malperfusion - PPM tonight - Potential DC tomorrow depending on how procedure goes  Best Practice (right click and "Reselect all SmartList Selections" daily)   Diet/type: NPO for procedure DVT prophylaxis: not indicated GI prophylaxis: PPI Lines: N/A Foley:  N/A Code Status:  full code Last date of multidisciplinary goals of care discussion [tbd] Family updated at bedside  31 min cc time (dopamine titration) Myrla Halsted MD  08/23/2023, 8:22 AM

## 2023-08-23 NOTE — Progress Notes (Addendum)
Rounding Note    Patient Name: Audra Mcsweeney Date of Encounter: 08/23/2023  [facility] Cardiologist: Chrystie Nose, MD feels   Subjective   Feels well, " I could go home right now"    Inpatient Medications    Scheduled Meds:  aspirin EC  81 mg Oral Daily   budesonide  0.5 mg Nebulization BID   chlorhexidine  60 mL Topical Once   Chlorhexidine Gluconate Cloth  6 each Topical Daily   ezetimibe  10 mg Oral Daily   ferrous sulfate  325 mg Oral Q breakfast   fluticasone  1 spray Each Nare Daily   furosemide  40 mg Intravenous BID   gentamicin (GARAMYCIN) 80 mg in sodium chloride 0.9 % 500 mL irrigation  80 mg Irrigation On Call   heparin  5,000 Units Subcutaneous Q8H   insulin aspart  0-15 Units Subcutaneous Q4H   levothyroxine  88 mcg Oral QAC breakfast   pantoprazole  40 mg Oral Daily   revefenacin  175 mcg Nebulization QHS   sodium chloride flush  3 mL Intravenous Q12H   sodium chloride flush  3 mL Intravenous Q12H   spironolactone  25 mg Oral Daily   Continuous Infusions:  sodium chloride     sodium chloride 50 mL/hr at 08/23/23 0700   sodium chloride     sodium chloride      ceFAZolin (ANCEF) IV     DOPamine 7.5 mcg/kg/min (08/23/23 0700)   PRN Meds: sodium chloride, acetaminophen, dicyclomine, docusate sodium, polyethylene glycol, sodium chloride flush, sodium chloride flush   Vital Signs    Vitals:   08/23/23 0441 08/23/23 0500 08/23/23 0600 08/23/23 0700  BP:  (!) 138/47 (!) 131/42 (!) 141/42  Pulse:  (!) 52 (!) 49 (!) 56  Resp:  18 17 20   Temp: 98.2 F (36.8 C)     TempSrc: Oral     SpO2:  96% 96% 97%  Weight:  98.1 kg      Intake/Output Summary (Last 24 hours) at 08/23/2023 0745 Last data filed at 08/23/2023 0700 Gross per 24 hour  Intake 1435.19 ml  Output 1300 ml  Net 135.19 ml      08/23/2023    5:00 AM 08/22/2023    6:01 AM 08/13/2023    9:03 AM  Last 3 Weights  Weight (lbs) 216 lb 4.3 oz 207 lb 3.7 oz 205 lb  Weight (kg) 98.1  kg 94 kg 92.987 kg      Telemetry    Still appears to be 2:1 AV block, V rate 50's, narrow - Personally Reviewed  ECG    No new EKGs - Personally Reviewed  Physical Exam   GEN: No acute distress.   Neck: No JVD Cardiac: RRR, no murmurs, rubs, or gallops.  Respiratory: CTA b/l. GI: Soft, nontender, non-distended  MS: No edema; No deformity. Neuro:  Nonfocal  Psych: Normal affect   Labs    High Sensitivity Troponin:   Recent Labs  Lab 08/22/23 0607 08/22/23 0845  TROPONINIHS 41* 55*     Chemistry Recent Labs  Lab 08/22/23 0607 08/22/23 1014 08/23/23 0204  NA 135  --  137  K 4.2  --  3.8  CL 99  --  100  CO2 23  --  24  GLUCOSE 154*  --  148*  BUN 38*  --  25*  CREATININE 1.58* 1.43* 1.18  CALCIUM 9.1  --  8.8*  PROT 7.3  --   --  ALBUMIN 3.6  --   --   AST 19  --   --   ALT 16  --   --   ALKPHOS 54  --   --   BILITOT 1.3*  --   --   GFRNONAA 45* 51* >60  ANIONGAP 13  --  13    Lipids No results for input(s): "CHOL", "TRIG", "HDL", "LABVLDL", "LDLCALC", "CHOLHDL" in the last 168 hours.  Hematology Recent Labs  Lab 08/22/23 0607 08/22/23 0845 08/22/23 1014  WBC 7.5 7.9 7.5  RBC 3.33* 3.07* 3.07*  HGB 9.8* 9.4* 9.4*  HCT 32.0* 29.8* 29.4*  MCV 96.1 97.1 95.8  MCH 29.4 30.6 30.6  MCHC 30.6 31.5 32.0  RDW 16.2* 16.5* 16.3*  PLT 170 156 141*   Thyroid  Recent Labs  Lab 08/23/23 0204  TSH 0.550    BNP Recent Labs  Lab 08/22/23 0607  BNP 898.5*    DDimer No results for input(s): "DDIMER" in the last 168 hours.   Radiology    DG Chest Portable 1 View  Result Date: 08/22/2023 CLINICAL DATA:  77 year old male with shortness of breath. EXAM: PORTABLE CHEST 1 VIEW COMPARISON:  Portable chest 06/05/2023 and earlier. FINDINGS: Portable AP semi upright view at 0631 hours. Chronic sternotomy. Stable lung volumes and mediastinal contours. Evidence of chronic pulmonary hyperinflation. No pneumothorax, pulmonary edema or consolidation. Difficult to  exclude trace left pleural effusion. But no other confluent lung opacity. No acute osseous abnormality identified. Negative visible bowel gas. IMPRESSION: Possible trace left pleural effusion but no other acute cardiopulmonary abnormality. Electronically Signed   By: Odessa Fleming M.D.   On: 08/22/2023 07:17    Cardiac Studies   06/25/23: TTE 1. Left ventricular ejection fraction, by estimation, is 55 to 60%. The  left ventricle has normal function. The left ventricle has no regional  wall motion abnormalities. There is moderate asymmetric left ventricular  hypertrophy of the basal-septal  segment. Left ventricular diastolic parameters are consistent with Grade I  diastolic dysfunction (impaired relaxation).   2. Right ventricular systolic function is normal. The right ventricular  size is normal. There is normal pulmonary artery systolic pressure.   3. Left atrial size was severely dilated.   4. The mitral valve is degenerative. Trivial mitral valve regurgitation.  Moderate mitral stenosis with mean gradient 9 mmHg and MVA by VTI 1.77  cm^2. Moderate to severe mitral annular calcification. Severe mitral  valvular calcification.   5. There is calcification in the LV outflow tract extending anteriorly  from the mitral valve. The aortic valve is functionally bicuspid with  fusion of the non- and left-coronary cusps. There is moderate  calcification of the aortic valve. No aortic  insufficiency. There is at least mild aortic stenosis, mean gradient 10  mmHg.   6. Aortic dilatation noted. There is mild dilatation of the ascending  aorta, measuring 39 mm.   7. The inferior vena cava is normal in size with greater than 50%  respiratory variability, suggesting right atrial pressure of 3 mmHg.    03/09/2020: stress myoview 1. Fixed mild apical inferior perfusion defect.  Normal wall motion in this area suggests likely artifact 2. Fixed moderate mid to basal inferolateral perfusion defect.  Normal wall  motion in this area suggests likely artifact 3. No ischemia     05/08/2016: LHC LM lesion, 85% stenosed. Ost 1st Mrg lesion, 100% stenosed. SVG was injected . Ost RCA to Prox RCA lesion, 100% stenosed. SVG . Origin to Prox  Graft lesion, 40% stenosed. Mid RCA to Dist RCA lesion, 100% stenosed. Prox LAD lesion, 100% stenosed. LIMA .   Patent saphenous vein grafts. SVG to PDA contains eccentric 40% proximal stenosis. SVG to the circumflex is widely patent. LIMA to the LAD is widely patent. Native distal left main contains 75% stenosis. The dominant obtuse marginal branch is totally occluded. The native right coronary is totally occluded. Left ventriculogram demonstrates inferior wall hypokinesis. EF 50%. Recent chest pain in the setting of PSVT with minimal enzyme elevation likely related to demand ischemia in the setting of underlying native coronary disease.  Patient Profile     77 y.o. male with a hx of COPD (on home O2), OSA,   CAD (CABG 2006, cath medically managed disease, patent grafts 2017), SVT (AVNRT ablation 2018, Dr. Elberta Fortis), severe and recurrnt GIBs (ischemic colitis), AFib (found March 2024 > not felt a candidate given his severe MS and lung disease), PVD (s/pmesenteric ischemia and underwent left brachial artery cutdown and mesenteric artery and celiac artery stenting April 2024), VHD (severe MS, at least mod AS), admitted with progressive weakness, SOB >> CHB  Assessment & Plan    CHB No reversible causes Remains better on dopamine VSS Planned for PPM today Patient remains agreeable   Further with attending cardiologist/CCM team  2. Acute/chronic CHF No SOB S/p IV lasix    For questions or updates, please contact [facility] Please consult [email or web address] for contact info under        Signed, Sheilah Pigeon, PA-C  08/23/2023, 7:45 AM    I have seen and examined this patient with Francis Dowse.  Agree with above, note added to reflect my findings.   Patient feeling well today.  Remains on dopamine.  GEN: Well nourished, well developed, in no acute distress  HEENT: normal  Neck: no JVD, carotid bruits, or masses Cardiac: RRR; no murmurs, rubs, or gallops,no edema  Respiratory:  clear to auscultation bilaterally, normal work of breathing GI: soft, nontender, nondistended, + BS MS: no deformity or atrophy  Skin: warm and dry Neuro:  Strength and sensation are intact Psych: euthymic mood, full affect   Complete heart block: Currently on dopamine.  No reversible causes found.  Zaniah [place] need pacemaker implant.  Risk and benefits have been discussed.  Risk include bleeding, tamponade, infection, pneumothorax, lead dislodgment, MI, death, stroke, renal failure.  He understands his risks and is agreed to the procedure.  Cliffton Spradley M. Donal Lynam MD 08/23/2023 10:46 AM

## 2023-08-23 NOTE — Discharge Instructions (Addendum)
After Your Pacemaker   You have a [employer] Pacemaker  ACTIVITY Do not lift your arm above shoulder height for 1 week after your procedure. After 7 days, you may progress as below.  You should remove your sling 24 hours after your procedure, unless otherwise instructed by your provider.     Friday August 30, 2023  Saturday August 31, 2023 Sunday September 01, 2023 Monday September 02, 2023   Do not lift, push, pull, or carry anything over 10 pounds with the affected arm until 6 weeks (Friday October 04, 2023 ) after your procedure.   You may drive AFTER your wound check, unless you have been told otherwise by your provider.   Ask your healthcare provider when you can go back to work   INCISION/Dressing If you are on a blood thinner such as Coumadin, Xarelto, Eliquis, Plavix, or Pradaxa please confirm with your provider when this should be resumed.   If large square, outer bandage is left in place, this can be removed after 24 hours from your procedure. Do not remove steri-strips or glue as below.   If a PRESSURE DRESSING (a bulky dressing that usually goes up over your shoulder) was applied or left in place, please follow instructions given by your provider on when to return to have this removed.   Monitor your Pacemaker site for redness, swelling, and drainage. Call the device clinic at [phone] if you experience these symptoms or fever/chills.  If your incision is sealed with Steri-strips or staples, you may shower 7 days after your procedure or when told by your provider. Do not remove the steri-strips or let the shower hit directly on your site. You may wash around your site with soap and water.    If you were discharged in a sling, please do not wear this during the day more than 48 hours after your surgery unless otherwise instructed. This may increase the risk of stiffness and soreness in your shoulder.   Avoid lotions, ointments, or perfumes over your incision until it  is well-healed.  You may use a hot tub or a pool AFTER your wound check appointment if the incision is completely closed.  Pacemaker Alerts:  Some alerts are vibratory and others beep. These are NOT emergencies. Please call our office to let us know. If this occurs at night or on weekends, it can wait until the next business day. Send a remote transmission.  If your device is capable of reading fluid status (for heart failure), you will be offered monthly monitoring to review this with you.   DEVICE MANAGEMENT Remote monitoring is used to monitor your pacemaker from home. This monitoring is scheduled every 91 days by our office. It allows [place] to keep an eye on the functioning of your device to ensure it is working properly. You will routinely see your Electrophysiologist annually (more often if necessary).   You should receive your ID card for your new device in 4-8 weeks. Keep this card with you at all times once received. Consider wearing a medical alert bracelet or necklace.  Your Pacemaker may be MRI compatible. This will be discussed at your next office visit/wound check.  You should avoid contact with strong electric or magnetic fields.   Do not use amateur (ham) radio equipment or electric (arc) welding torches. MP3 player headphones with magnets should not be used. Some devices are safe to use if held at least 12 inches (30 cm) from your Pacemaker. These include power tools, lawn  mowers, and speakers. If you are unsure if something is safe to use, ask your health care provider.  When using your cell phone, hold it to the ear that is on the opposite side from the Pacemaker. Do not leave your cell phone in a pocket over the Pacemaker.  You may safely use electric blankets, heating pads, computers, and microwave ovens.  Call the office right away if: You have chest pain. You feel more short of breath than you have felt before. You feel more light-headed than you have felt before. Your  incision starts to open up.  This information is not intended to replace advice given to you by your health care provider. Make sure you discuss any questions you have with your health care provider.

## 2023-08-24 ENCOUNTER — Inpatient Hospital Stay (HOSPITAL_COMMUNITY): Payer: Federal, State, Local not specified - PPO

## 2023-08-24 DIAGNOSIS — I442 Atrioventricular block, complete: Secondary | ICD-10-CM | POA: Diagnosis not present

## 2023-08-24 DIAGNOSIS — G9349 Other encephalopathy: Secondary | ICD-10-CM | POA: Diagnosis not present

## 2023-08-24 DIAGNOSIS — I5043 Acute on chronic combined systolic (congestive) and diastolic (congestive) heart failure: Secondary | ICD-10-CM | POA: Diagnosis not present

## 2023-08-24 DIAGNOSIS — R918 Other nonspecific abnormal finding of lung field: Secondary | ICD-10-CM | POA: Diagnosis not present

## 2023-08-24 DIAGNOSIS — R570 Cardiogenic shock: Secondary | ICD-10-CM | POA: Diagnosis not present

## 2023-08-24 DIAGNOSIS — Z950 Presence of cardiac pacemaker: Secondary | ICD-10-CM

## 2023-08-24 DIAGNOSIS — J90 Pleural effusion, not elsewhere classified: Secondary | ICD-10-CM | POA: Diagnosis not present

## 2023-08-24 LAB — CBC
HCT: 29.4 % — ABNORMAL LOW (ref 39.0–52.0)
Hemoglobin: 9.5 g/dL — ABNORMAL LOW (ref 13.0–17.0)
MCH: 30.7 pg (ref 26.0–34.0)
MCHC: 32.3 g/dL (ref 30.0–36.0)
MCV: 95.1 fL (ref 80.0–100.0)
Platelets: 183 10*3/uL (ref 150–400)
RBC: 3.09 MIL/uL — ABNORMAL LOW (ref 4.22–5.81)
RDW: 15.7 % — ABNORMAL HIGH (ref 11.5–15.5)
WBC: 4.4 10*3/uL (ref 4.0–10.5)
nRBC: 0 % (ref 0.0–0.2)

## 2023-08-24 LAB — BASIC METABOLIC PANEL
Anion gap: 12 (ref 5–15)
BUN: 25 mg/dL — ABNORMAL HIGH (ref 8–23)
CO2: 25 mmol/L (ref 22–32)
Calcium: 8.7 mg/dL — ABNORMAL LOW (ref 8.9–10.3)
Chloride: 98 mmol/L (ref 98–111)
Creatinine, Ser: 1.16 mg/dL (ref 0.61–1.24)
GFR, Estimated: >60 mL/min (ref >60)
Glucose, Bld: 128 mg/dL — ABNORMAL HIGH (ref 70–99)
Potassium: 3.7 mmol/L (ref 3.5–5.1)
Sodium: 135 mmol/L (ref 135–145)

## 2023-08-24 LAB — MAGNESIUM: Magnesium: 2 mg/dL (ref 1.7–2.4)

## 2023-08-24 LAB — PHOSPHORUS: Phosphorus: 3.1 mg/dL (ref 2.5–4.6)

## 2023-08-24 LAB — GLUCOSE, CAPILLARY
Glucose-Capillary: 122 mg/dL — ABNORMAL HIGH (ref 70–99)
Glucose-Capillary: 167 mg/dL — ABNORMAL HIGH (ref 70–99)

## 2023-08-24 MED ORDER — POTASSIUM CHLORIDE CRYS ER 20 MEQ PO TBCR
40.0000 meq | EXTENDED_RELEASE_TABLET | Freq: Once | ORAL | Status: AC
  Administered 2023-08-24: 40 meq via ORAL
  Filled 2023-08-24: qty 2

## 2023-08-24 MED ORDER — FUROSEMIDE 40 MG PO TABS: 80.0000 mg | ORAL_TABLET | Freq: Every day | ORAL | Status: DC

## 2023-08-24 NOTE — Progress Notes (Signed)
[facility] ADULT ICU REPLACEMENT PROTOCOL   The patient does apply for the [facility] Adult ICU Electrolyte Replacment Protocol based on the criteria listed below:   1.Exclusion criteria: TCTS, ECMO, Dialysis, and Myasthenia Gravis patients 2. Is GFR >/= 30 ml/min? Yes.    Patient's GFR today is >60 3. Is SCr </= 2? Yes.   Patient's SCr is 1.16 mg/dL 4. Did SCr increase >/= 0.5 in 24 hours? No. 5.Pt's weight >40kg  Yes.   6. Abnormal electrolyte(s): potassium 3.7  7. Electrolytes replaced per protocol 8.  Call MD STAT for K+ </= 2.5, Phos </= 1, or Mag </= 1 Physician:  protocol  Melvern Banker 08/24/2023 5:17 AM

## 2023-08-24 NOTE — Progress Notes (Signed)
DAILY PROGRESS NOTE   Patient Name: Jeremiah [name] Date of Encounter: 08/24/2023 Cardiologist: Chrystie Nose, MD  Chief Complaint   Feels great  Patient Profile   77 yo male with history of CAD status post three-vessel CABG in 2006, COPD, PAD, recurrent SVT status post ablation, new onset atrial fibrillation, moderate aortic stenosis and severe mitral stenosis, not anticoagulated due to recent GI bleeding and not considered a candidate for watchman, now presents with progressive dyspnea and fatigue and found to be in complete heart block.   Subjective   No issues overnight. Underwent PPM yesterday. Now off dopamine. HE is about 2L negative.  Creatinine stable. Hemoglobin stable at 9.5.  Objective   Vitals:   08/24/23 0842 08/24/23 0847 08/24/23 0858 08/24/23 0900  BP: 93/70 111/80 (!) 125/56 (!) 125/56  Pulse: 74 95 64 63  Resp: (!) 32 18 11 12   Temp:      TempSrc:      SpO2: 95% 95% 97% 100%  Weight:        Intake/Output Summary (Last 24 hours) at 08/24/2023 0933 Last data filed at 08/24/2023 0900 Gross per 24 hour  Intake 610.29 ml  Output 2100 ml  Net -1489.71 ml   Filed Weights   08/22/23 0601 08/23/23 0500 08/24/23 0500  Weight: 94 kg 98.1 kg 94.1 kg    Physical Exam   General appearance: alert, no distress, and mildly obese Neck: no carotid bruit, no JVD, and thyroid not enlarged, symmetric, no tenderness/mass/nodules Lungs: clear to auscultation bilaterally Heart: regular rate and rhythm Abdomen: soft, non-tender; bowel sounds normal; no masses,  no organomegaly Extremities: edema trace pedal Pulses: 2+ and symmetric Skin: Skin color, texture, turgor normal. No rashes or lesions Neurologic: Grossly normal Psych: Pleasant  Inpatient Medications    Scheduled Meds:  aspirin EC  81 mg Oral Daily   budesonide  0.5 mg Nebulization BID   Chlorhexidine Gluconate Cloth  6 each Topical Daily   ezetimibe  10 mg Oral Daily   ferrous sulfate  325 mg Oral Q  breakfast   fluticasone  1 spray Each Nare Daily   furosemide  40 mg Intravenous BID   levothyroxine  88 mcg Oral QAC breakfast   pantoprazole  40 mg Oral Daily   revefenacin  175 mcg Nebulization QHS   sodium chloride flush  3 mL Intravenous Q12H   sodium chloride flush  3 mL Intravenous Q12H   spironolactone  25 mg Oral Daily    Continuous Infusions:  sodium chloride     sodium chloride 50 mL/hr at 08/23/23 1600    ceFAZolin (ANCEF) IV 1 g (08/24/23 0540)   DOPamine Stopped (08/23/23 1820)    PRN Meds: sodium chloride, acetaminophen, dicyclomine, docusate sodium, ondansetron (ZOFRAN) IV, polyethylene glycol, sodium chloride flush   Labs   Results for orders placed or performed during the hospital encounter of 08/22/23 (from the past 48 hour(s))  Hemoglobin A1c     Status: Abnormal   Collection Time: 08/22/23 10:14 AM  Result Value Ref Range   Hgb A1c MFr Bld 5.9 (H) 4.8 - 5.6 %    Comment: (NOTE) Pre diabetes:          5.7%-6.4%  Diabetes:              >6.4%  Glycemic control for   <7.0% adults with diabetes    Mean Plasma Glucose 122.63 mg/dL    Comment: Performed at [facility], [address]  [place], [place] [address]  CBC     Status: Abnormal   Collection Time: 08/22/23 10:14 AM  Result Value Ref Range   WBC 7.5 4.0 - 10.5 K/uL   RBC 3.07 (L) 4.22 - 5.81 MIL/uL   Hemoglobin 9.4 (L) 13.0 - 17.0 g/dL   HCT 46.9 (L) 62.9 - 52.8 %   MCV 95.8 80.0 - 100.0 fL   MCH 30.6 26.0 - 34.0 pg   MCHC 32.0 30.0 - 36.0 g/dL   RDW 41.3 (H) 24.4 - 01.0 %   Platelets 141 (L) 150 - 400 K/uL   nRBC 0.0 0.0 - 0.2 %    Comment: Performed at [facility], [address] [place], [place] [address]  Creatinine, serum     Status: Abnormal   Collection Time: 08/22/23 10:14 AM  Result Value Ref Range   Creatinine, Ser 1.43 (H) 0.61 - 1.24 mg/dL   GFR, Estimated 51 (L) >60 mL/min    Comment: (NOTE) Calculated using the CKD-EPI Creatinine Equation (2021) Performed at  [facility], [address], [place], [place] [address]   Glucose, capillary     Status: Abnormal   Collection Time: 08/22/23 10:17 AM  Result Value Ref Range   Glucose-Capillary 163 (H) 70 - 99 mg/dL    Comment: Glucose reference range applies only to samples taken after fasting for at least 8 hours.  Glucose, capillary     Status: Abnormal   Collection Time: 08/22/23 12:09 PM  Result Value Ref Range   Glucose-Capillary 167 (H) 70 - 99 mg/dL    Comment: Glucose reference range applies only to samples taken after fasting for at least 8 hours.  Surgical PCR screen     Status: None   Collection Time: 08/22/23  3:57 PM   Specimen: Nasal Mucosa; Nasal Swab  Result Value Ref Range   MRSA, PCR NEGATIVE NEGATIVE   Staphylococcus aureus NEGATIVE NEGATIVE    Comment: (NOTE) The Xpert SA Assay (FDA approved for NASAL specimens in patients 83 years of age and older), is one component of a comprehensive surveillance program. It is not intended to diagnose infection nor to guide or monitor treatment. Performed at [facility], [address], [place], [place] [address]   Glucose, capillary     Status: Abnormal   Collection Time: 08/22/23  5:20 PM  Result Value Ref Range   Glucose-Capillary 164 (H) 70 - 99 mg/dL    Comment: Glucose reference range applies only to samples taken after fasting for at least 8 hours.  Glucose, capillary     Status: Abnormal   Collection Time: 08/22/23  7:51 PM  Result Value Ref Range   Glucose-Capillary 148 (H) 70 - 99 mg/dL    Comment: Glucose reference range applies only to samples taken after fasting for at least 8 hours.  Glucose, capillary     Status: Abnormal   Collection Time: 08/22/23 11:44 PM  Result Value Ref Range   Glucose-Capillary 154 (H) 70 - 99 mg/dL    Comment: Glucose reference range applies only to samples taken after fasting for at least 8 hours.  Basic metabolic panel     Status: Abnormal   Collection Time: 08/23/23  2:04 AM   Result Value Ref Range   Sodium 137 135 - 145 mmol/L   Potassium 3.8 3.5 - 5.1 mmol/L   Chloride 100 98 - 111 mmol/L   CO2 24 22 - 32 mmol/L   Glucose, Bld 148 (H) 70 -  99 mg/dL    Comment: Glucose reference range applies only to samples taken after fasting for at least 8 hours.   BUN 25 (H) 8 - 23 mg/dL   Creatinine, Ser 4.13 0.61 - 1.24 mg/dL   Calcium 8.8 (L) 8.9 - 10.3 mg/dL   GFR, Estimated >24 >40 mL/min    Comment: (NOTE) Calculated using the CKD-EPI Creatinine Equation (2021)    Anion gap 13 5 - 15    Comment: Performed at [facility], [address] [place], [place] [address]  TSH     Status: None   Collection Time: 08/23/23  2:04 AM  Result Value Ref Range   TSH 0.550 0.350 - 4.500 uIU/mL    Comment: Performed by a 3rd Generation assay with a functional sensitivity of <=0.01 uIU/mL. Performed at [facility], [address] [place], [place] [address]   Magnesium     Status: None   Collection Time: 08/23/23  2:04 AM  Result Value Ref Range   Magnesium 2.2 1.7 - 2.4 mg/dL    Comment: Performed at [facility], [address] [place] [address], [place], [place] [address]  Glucose, capillary     Status: Abnormal   Collection Time: 08/23/23  3:51 AM  Result Value Ref Range   Glucose-Capillary 126 (H) 70 - 99 mg/dL    Comment: Glucose reference range applies only to samples taken after fasting for at least 8 hours.  Glucose, capillary     Status: Abnormal   Collection Time: 08/23/23  7:54 AM  Result Value Ref Range   Glucose-Capillary 166 (H) 70 - 99 mg/dL    Comment: Glucose reference range applies only to samples taken after fasting for at least 8 hours.  Glucose, capillary     Status: Abnormal   Collection Time: 08/23/23 11:20 AM  Result Value Ref Range   Glucose-Capillary 155 (H) 70 - 99 mg/dL    Comment: Glucose reference range applies only to samples taken after fasting for at least 8 hours.  Glucose, capillary     Status: Abnormal   Collection Time:  08/23/23  3:56 PM  Result Value Ref Range   Glucose-Capillary 144 (H) 70 - 99 mg/dL    Comment: Glucose reference range applies only to samples taken after fasting for at least 8 hours.  Glucose, capillary     Status: Abnormal   Collection Time: 08/23/23 11:46 PM  Result Value Ref Range   Glucose-Capillary 109 (H) 70 - 99 mg/dL    Comment: Glucose reference range applies only to samples taken after fasting for at least 8 hours.  Basic metabolic panel     Status: Abnormal   Collection Time: 08/24/23  2:39 AM  Result Value Ref Range   Sodium 135 135 - 145 mmol/L   Potassium 3.7 3.5 - 5.1 mmol/L   Chloride 98 98 - 111 mmol/L   CO2 25 22 - 32 mmol/L   Glucose, Bld 128 (H) 70 - 99 mg/dL    Comment: Glucose reference range applies only to samples taken after fasting for at least 8 hours.   BUN 25 (H) 8 - 23 mg/dL   Creatinine, Ser 4.25 0.61 - 1.24 mg/dL   Calcium 8.7 (L) 8.9 - 10.3 mg/dL   GFR, Estimated >95 >63 mL/min    Comment: (NOTE) Calculated using the CKD-EPI Creatinine Equation (2021)    Anion gap 12 5 - 15    Comment: Performed at [facility], [address] [place], [place] [address]  CBC     Status: Abnormal   Collection Time: 08/24/23  2:39 AM  Result Value Ref Range   WBC 4.4 4.0 - 10.5 K/uL   RBC 3.09 (L) 4.22 - 5.81 MIL/uL   Hemoglobin 9.5 (L) 13.0 - 17.0 g/dL   HCT 91.4 (L) 78.2 - 95.6 %   MCV 95.1 80.0 - 100.0 fL   MCH 30.7 26.0 - 34.0 pg   MCHC 32.3 30.0 - 36.0 g/dL   RDW 21.3 (H) 08.6 - 57.8 %   Platelets 183 150 - 400 K/uL   nRBC 0.0 0.0 - 0.2 %    Comment: Performed at [facility], [address] [place], [place] [address]  Phosphorus     Status: None   Collection Time: 08/24/23  2:39 AM  Result Value Ref Range   Phosphorus 3.1 2.5 - 4.6 mg/dL    Comment: Performed at [facility], [address], [place], [place] [address]  Magnesium     Status: None   Collection Time: 08/24/23  2:39 AM  Result Value Ref Range   Magnesium 2.0 1.7  - 2.4 mg/dL    Comment: Performed at [facility], [address], [place], [place] [address]  Glucose, capillary     Status: Abnormal   Collection Time: 08/24/23  3:47 AM  Result Value Ref Range   Glucose-Capillary 167 (H) 70 - 99 mg/dL    Comment: Glucose reference range applies only to samples taken after fasting for at least 8 hours.    ECG   A-sensed, V-paced at 52 - Personally Reviewed  Telemetry   Paced rhythm - Personally Reviewed  Radiology    DG Chest 2 View  Result Date: 08/24/2023 CLINICAL DATA:  [id number] Cardiac device in situ, other [id number] EXAM: CHEST - 2 VIEW COMPARISON:  August 22, 2023, December 25, 2018 FINDINGS: The cardiomediastinal silhouette is unchanged in contour. Status post median sternotomy. Atherosclerotic calcifications.Interval placement of a dual lead LEFT chest cardiac pacing device. Small RIGHT pleural effusion. No significant pneumothorax. Unchanged hazy opacity along the LEFT lung base consistent with overlapping pericardial fat. Similar background of reticular prominence visualized abdomen is unremarkable. Multilevel degenerative changes of the thoracic spine. IMPRESSION: 1. Interval placement of a dual lead LEFT chest cardiac pacing device. No significant pneumothorax. 2. Small RIGHT pleural effusion. Electronically Signed   By: Meda Klinefelter M.D.   On: 08/24/2023 09:25   EP PPM/ICD IMPLANT  Result Date: 08/23/2023 SURGEON:  Loman Brooklyn, MD   PREPROCEDURE DIAGNOSIS:  complete AV block   POSTPROCEDURE DIAGNOSIS:  complete AV block    PROCEDURES:  1. Pacemaker implantation.   INTRODUCTION:  Suraj Journigan is a 77 y.o. male with a history of bradycardia who presents today for pacemaker implantation.  The patient reports intermittent episodes of dizziness over the past few months.  No reversible causes have been identified.  The patient therefore presents today for pacemaker implantation.   DESCRIPTION OF PROCEDURE:  Informed written consent was  obtained, and  the patient was brought to the electrophysiology lab in a fasting state.  The patient required no sedation for the procedure today.  The patients left chest was prepped and draped in the usual sterile fashion by the EP lab staff. The skin overlying the left deltopectoral region was infiltrated with lidocaine for local analgesia.  A 4-cm incision was made over the left deltopectoral region.  A left subcutaneous pacemaker pocket was fashioned using  a combination of sharp and blunt dissection. Electrocautery was required to assure hemostasis.  RA/RV Lead Placement: The left axillary vein was therefore cannulated.  Through the left axillary vein, a Abbott Ultipace 1231-52  (serial number  [id number]) right atrial lead and an Abbott Ultipace 1231-65 (serial number  [id number]) right ventricular lead were advanced with fluoroscopic visualization into the right atrial appendage and right ventricular apex positions respectively.  Initial atrial lead P- waves measured 4.8 mV with impedance of 638 ohms and a threshold of 1.4 V at 0.8 msec.  Right ventricular lead R-waves measured 6.7 mV with an impedance of 552 ohms and a threshold of 0.7 V at 0.5 msec.  Both leads were secured to the pectoralis fascia using #2-0 silk over the suture sleeves. Device Placement:  The leads were then connected to an Abbott Assurity [id number]  (serial number  [id number] ) pacemaker.  The pocket was irrigated with copious gentamicin solution.  The pacemaker was then placed into the pocket.  The pocket was then closed in 3 layers with 2.0 Vicryl suture for the 3.0 Vicryl suture subcutaneous and subcuticular layers.  Steri-  Strips and a sterile dressing were then applied. EBL<57ml.  There were no early apparent complications.   CONCLUSIONS:  1. Successful implantation of a Abbott Assurity [id number] dual-chamber pacemaker for symptomatic bradycardia  2. No early apparent complications.   During this procedure medications were administered to  achieve and maintain moderate conscious sedation while the patient's heart rate, blood pressure, and oxygen saturation were continuously monitored and I was present face-to-face 100% of this time.   Will Elberta Fortis, MD 08/23/2023 6:34 PM   Cardiac Studies   N/A  Assessment   Principal Problem:   Complete heart block (HCC) Active Problems:   S/P CABG x 3   Essential hypertension   COPD GOLD II with reversibility   Coronary artery disease involving coronary bypass graft of native heart without angina pectoris   Type 2 diabetes mellitus with hyperglycemia, without long-term current use of insulin (HCC)   Mitral stenosis   Acute on chronic diastolic CHF (congestive heart failure) (HCC)   Aortic stenosis   Heart block   Cardiogenic shock (HCC)   Plan   Successful PPM placement yesterday. Lead position is satisfactory of CXR today- there is a small right pleural effusion, but no PTX.  Should be able to d/c home today. Will d/w Dr. Nelly Laurence in cardiac EP. Will stop IV lasix, transition back to home lasix 80 mg po daily.   Time Spent Directly with Patient:  I have spent a total of 25 minutes with the patient reviewing hospital notes, telemetry, EKGs, labs and examining the patient as well as establishing an assessment and plan that was discussed personally with the patient.  > 50% of time was spent in direct patient care.  Length of Stay:  LOS: 2 days   Chrystie Nose, MD, [facility], FACP  [place]  [facility]  Medical Director of the [facility]  Cardiovascular Risk Reduction Clinic Diplomate of the American Board of Clinical Lipidology Attending Cardiologist  Direct Dial: [phone]  Fax: [phone]  Website:  [email or web address] 08/24/2023, 9:33 AM

## 2023-08-24 NOTE — Discharge Summary (Signed)
Physician Discharge Summary  Patient ID: Jeremiah [name] MRN: [id number] DOB/AGE: [birth year] 76 y.o.  Admit date: 08/22/2023 Discharge date: 08/24/2023  Admission Diagnoses: Heart failure  Discharge Diagnoses:  Complete heart block Acute on chronic diastolic heart failure COPD DM2 HTN CAD with prior CABG Aortic and mitral stenosis History of GIB deemed not AC candidate Peripheral arterial disease  Discharged Condition: fair  Hospital Course:  77 yo male with history of CAD status post three-vessel CABG in 2006, COPD, PAD, recurrent SVT status post ablation, new onset atrial fibrillation, moderate aortic stenosis and severe mitral stenosis, not anticoagulated due to recent GI bleeding and not considered a candidate for watchman, now presents with progressive dyspnea and fatigue and found to be in complete heart block and fluid overloaded.  Underwent permanent pacemaker placement on 08/23/23 without complication.  Diuresed to good effect and breathing improved.  He will followup with cardiology and EP on discharge.  Consults: cardiology  Significant Diagnostic Studies: EKG w/ complete AV block  Treatments:  08/23/23  1. Successful implantation of a Abbott Assurity [id number] dual-chamber pacemaker for symptomatic bradycardia  2. No early apparent complications.   Discharge Exam: Blood pressure 114/74, pulse 77, temperature 97.8 F (36.6 C), resp. rate 20, weight 94.1 kg, SpO2 95%.    NAME:  Jeremiah [name], MRN:  [id number], DOB:  [birth year], LOS: 2 ADMISSION DATE:  08/22/2023, CONSULTATION DATE: 08/22/2023 REFERRING MD: Cardiology, CHIEF COMPLAINT: Shortness of breath, new onset of heart block, symptomatic bradycardia   History of Present Illness:  Jeremiah [name] 77 year old male with an extensive past medical history that is well-documented below.  He has spent the last 2 months in and out of [facility] with GI bleed, valvular disease, reported stents of the femoral arteries.  He  presents to [facility] with worsening shortness of breath although he is followed by Dr. Sherene Sires and is O2 dependent at home.  He is found to be in heart block with a ventricular rate of 30-40 but was systolic blood pressure greater than 90.  He is awake alert following commands moves all extremities and does not appear to be in acute distress at this time.  He does have an external pacemaker system in place the plan is to utilize dopamine and as a transition to implanted pacemaker.  Pulmonary critical care asked to evaluate.   Pertinent  Medical History        Past Medical History:  Diagnosis Date   A-fib (HCC)     Acute on chronic systolic (congestive) heart failure (HCC) 06/10/2017   Acute pulmonary edema (HCC)     Anemia     Aortic valve sclerosis 12/2018    Noted on ECHO   Arthritis     CAD (coronary artery disease)      a. 2006: CABG in 2006 with LIMA-LAD, SVG-OM1, and SVG-RPDA   Cardiomegaly 12/2018    Stable, noted on CXR   Carpal tunnel syndrome      Right   Chronic pain 03/21/2016   Colon cancer (HCC) 2006   COPD (chronic obstructive pulmonary disease) (HCC)     Diabetes mellitus without complication (HCC)     DVT (deep venous thrombosis) (HCC)      Right   Essential hypertension 03/21/2016   GERD (gastroesophageal reflux disease)     History of blood transfusion     History of Clostridioides difficile infection     History of prostate cancer 03/21/2016   History of PSVT (paroxysmal supraventricular tachycardia)  HLD (hyperlipidemia)     HTN (hypertension)     Hx of CABG 2006   Hypercholesteremia 03/21/2016   Hypothyroidism     LAE (left atrial enlargement) 12/2018    Severe, Noted on ECHO   LVH (left ventricular hypertrophy) 12/2018    Mild, Noted on ECHO   Medication management 03/21/2016   Mitral annular calcification 12/2018    with mild MS, Noted on ECHO   Morbid obesity (HCC) 03/21/2016   Myocardial infarct (HCC)     OSA (obstructive sleep apnea)  03/21/2016    uses oxygen at night time   Pain in right ankle and joints of right foot 03/21/2016   Paronychia 03/21/2016   Phimosis     Pneumonia     Primary insomnia 03/21/2016   Prostate cancer (HCC) 2008   Pulmonary hypertension (HCC) 12/2018    Moderate, Noted on ECHO   Tobacco dependence 03/21/2016   Tricuspid regurgitation 12/2018    Mild, Noted on ECHO            Significant Hospital Events: Including procedures, antibiotic start and stop dates in addition to other pertinent events       Interim History / Subjective:  Improved on dopamine.   Objective   Blood pressure 118/67, pulse 68, temperature (!) 97.4 F (36.3 C), temperature source Axillary, resp. rate 16, weight 94.1 kg, SpO2 97%.    >         Intake/Output Summary (Last 24 hours) at 08/24/2023 0750 Last data filed at 08/24/2023 0200    Gross per 24 hour  Intake 736.66 ml  Output 2700 ml  Net -1963.34 ml         Filed Weights    08/22/23 0601 08/23/23 0500 08/24/23 0500  Weight: 94 kg 98.1 kg 94.1 kg      Examination: No distress MMM, trachea midline Moves to command Abd soft Aox3 Heart sounds regular, ext warm Lungs diminished, no accessory muscle use Pacemaker site dressed, no strikethrough  Disposition: Discharge disposition: 01-Home or Self Care        Allergies as of 08/24/2023       Reactions   Shellfish Allergy Shortness Of Breath, Nausea And Vomiting, Rash   Scallops   Fluzone [influenza Virus Vaccine] Nausea And Vomiting, Palpitations, Rash        Medication List     STOP taking these medications    omeprazole 20 MG capsule Commonly known as: PRILOSEC   potassium chloride SA 20 MEQ tablet Commonly known as: KLOR-CON M       TAKE these medications    aspirin EC 81 MG tablet Take 1 tablet (81 mg total) by mouth daily. Swallow whole.   budesonide 0.5 MG/2ML nebulizer solution Commonly known as: Pulmicort Take 2 mLs (0.5 mg total) by nebulization 2 (two)  times daily.   colchicine 0.6 MG tablet Take 0.6 mg by mouth daily as needed (Gout).   Dexilant 60 MG capsule Generic drug: dexlansoprazole Take 60 mg by mouth daily.   dicyclomine 10 MG capsule Commonly known as: BENTYL Take 10 mg by mouth daily as needed for spasms.   ezetimibe 10 MG tablet Commonly known as: ZETIA TAKE 1 TABLET(10 MG) BY MOUTH DAILY   FeroSul 325 (65 Fe) MG tablet Generic drug: ferrous sulfate TAKE 1 TABLET BY MOUTH DAILY WITH BREAKFAST   fluticasone 50 MCG/ACT nasal spray Commonly known as: FLONASE Place 1 spray into both nostrils daily.   furosemide 40 MG tablet Commonly known as:  LASIX Take 2 tablets (80 mg total) by mouth daily.   glimepiride 4 MG tablet Commonly known as: AMARYL Take 4 mg by mouth daily.   ketoconazole 2 % shampoo Commonly known as: NIZORAL Apply 1 Application topically daily.   levothyroxine 88 MCG tablet Commonly known as: SYNTHROID Take 88 mcg by mouth daily before breakfast.   Potassium Chloride ER 20 MEQ Tbcr Take 1 tablet by mouth daily.   revefenacin 175 MCG/3ML nebulizer solution Commonly known as: YUPELRI Take 175 mcg by nebulization in the morning and at bedtime.   spironolactone 25 MG tablet Commonly known as: ALDACTONE Take 1 tablet (25 mg total) by mouth daily.         Signed: Lorin Glass 08/24/2023, 11:30 AM

## 2023-08-24 NOTE — Progress Notes (Signed)
NAME:  Michaell Igleheart, MRN:  [id number], DOB:  1946-04-22, LOS: 2 ADMISSION DATE:  08/22/2023, CONSULTATION DATE: 08/22/2023 REFERRING MD: Cardiology, CHIEF COMPLAINT: Shortness of breath, new onset of heart block, symptomatic bradycardia  History of Present Illness:  Mr. Thomlinson 77 year old male with an extensive past medical history that is well-documented below.  He has spent the last 2 months in and out of [facility] with GI bleed, valvular disease, reported stents of the femoral arteries.  He presents to [facility] with worsening shortness of breath although he is followed by Dr. Sherene Sires and is O2 dependent at home.  He is found to be in heart block with a ventricular rate of 30-40 but was systolic blood pressure greater than 90.  He is awake alert following commands moves all extremities and does not appear to be in acute distress at this time.  He does have an external pacemaker system in place the plan is to utilize dopamine and as a transition to implanted pacemaker.  Pulmonary critical care asked to evaluate.  Pertinent  Medical History   Past Medical History:  Diagnosis Date   A-fib (HCC)    Acute on chronic systolic (congestive) heart failure (HCC) 06/10/2017   Acute pulmonary edema (HCC)    Anemia    Aortic valve sclerosis 12/2018   Noted on ECHO   Arthritis    CAD (coronary artery disease)    a. 2006: CABG in 2006 with LIMA-LAD, SVG-OM1, and SVG-RPDA   Cardiomegaly 12/2018   Stable, noted on CXR   Carpal tunnel syndrome    Right   Chronic pain 03/21/2016   Colon cancer (HCC) 2006   COPD (chronic obstructive pulmonary disease) (HCC)    Diabetes mellitus without complication (HCC)    DVT (deep venous thrombosis) (HCC)    Right   Essential hypertension 03/21/2016   GERD (gastroesophageal reflux disease)    History of blood transfusion    History of Clostridioides difficile infection    History of prostate cancer 03/21/2016   History of PSVT (paroxysmal  supraventricular tachycardia)    HLD (hyperlipidemia)    HTN (hypertension)    Hx of CABG 2006   Hypercholesteremia 03/21/2016   Hypothyroidism    LAE (left atrial enlargement) 12/2018   Severe, Noted on ECHO   LVH (left ventricular hypertrophy) 12/2018   Mild, Noted on ECHO   Medication management 03/21/2016   Mitral annular calcification 12/2018   with mild MS, Noted on ECHO   Morbid obesity (HCC) 03/21/2016   Myocardial infarct (HCC)    OSA (obstructive sleep apnea) 03/21/2016   uses oxygen at night time   Pain in right ankle and joints of right foot 03/21/2016   Paronychia 03/21/2016   Phimosis    Pneumonia    Primary insomnia 03/21/2016   Prostate cancer (HCC) 2008   Pulmonary hypertension (HCC) 12/2018   Moderate, Noted on ECHO   Tobacco dependence 03/21/2016   Tricuspid regurgitation 12/2018   Mild, Noted on ECHO     Significant Hospital Events: Including procedures, antibiotic start and stop dates in addition to other pertinent events     Interim History / Subjective:  Improved on dopamine.  Objective   Blood pressure 118/67, pulse 68, temperature (!) 97.4 F (36.3 C), temperature source Axillary, resp. rate 16, weight 94.1 kg, SpO2 97%.        Intake/Output Summary (Last 24 hours) at 08/24/2023 0750 Last data filed at 08/24/2023 0200 Gross per 24 hour  Intake  736.66 ml  Output 2700 ml  Net -1963.34 ml   Filed Weights   08/22/23 0601 08/23/23 0500 08/24/23 0500  Weight: 94 kg 98.1 kg 94.1 kg    Examination: No distress MMM, trachea midline Moves to command Abd soft Aox3 Heart sounds regular, ext warm Lungs diminished, no accessory muscle use  CBC benign K repleted  Resolved Hospital Problem list     Assessment & Plan:  Symptomatic third-degree heart block - s/p PPM COPD followed by Dr. Sherene Sires former smoker quit 6 years ago' Chronic O2 needs- 2-4LPM; on RA this am Extensive cardiac history including coronary bypass graft x 3, femoral  stents, history of atrial fibrillation ventricular response status post ablation.Marland Kitchen Extensive GI history of GI bleed acute blood loss anemia.- precluding AC, s/p ablation Diabetes- A1c is good AKI and encephalopathy- due to low flow state, improved with dopamine Mild hypokalemia- repleted Anemia- stable  - Await EP input, may be able to go home today - Assure ambulation is okay prior to DC  Myrla Halsted MD  08/24/2023, 7:50 AM

## 2023-08-24 NOTE — Plan of Care (Signed)
  Problem: Education: Goal: Ability to describe self-care measures that may prevent or decrease complications (Diabetes Survival Skills Education) will improve Outcome: Progressing   Problem: Fluid Volume: Goal: Ability to maintain a balanced intake and output will improve Outcome: Progressing   Problem: Health Behavior/Discharge Planning: Goal: Ability to identify and utilize available resources and services will improve Outcome: Progressing   Problem: Nutritional: Goal: Maintenance of adequate nutrition will improve Outcome: Progressing Goal: Progress toward achieving an optimal weight will improve Outcome: Progressing   Problem: Skin Integrity: Goal: Risk for impaired skin integrity will decrease Outcome: Progressing   Problem: Tissue Perfusion: Goal: Adequacy of tissue perfusion will improve Outcome: Progressing

## 2023-08-26 ENCOUNTER — Ambulatory Visit (HOSPITAL_COMMUNITY): Payer: Federal, State, Local not specified - PPO

## 2023-08-28 DIAGNOSIS — I442 Atrioventricular block, complete: Secondary | ICD-10-CM | POA: Diagnosis not present

## 2023-08-28 DIAGNOSIS — D500 Iron deficiency anemia secondary to blood loss (chronic): Secondary | ICD-10-CM | POA: Diagnosis not present

## 2023-08-28 DIAGNOSIS — I739 Peripheral vascular disease, unspecified: Secondary | ICD-10-CM | POA: Diagnosis not present

## 2023-08-28 DIAGNOSIS — I5022 Chronic systolic (congestive) heart failure: Secondary | ICD-10-CM | POA: Diagnosis not present

## 2023-09-03 ENCOUNTER — Ambulatory Visit (HOSPITAL_COMMUNITY): Payer: Federal, State, Local not specified - PPO

## 2023-09-04 ENCOUNTER — Ambulatory Visit: Payer: Federal, State, Local not specified - PPO | Attending: Internal Medicine

## 2023-09-04 DIAGNOSIS — I442 Atrioventricular block, complete: Secondary | ICD-10-CM

## 2023-09-04 LAB — CUP PACEART INCLINIC DEVICE CHECK
Battery Remaining Longevity: 128 mo
Battery Voltage: 3.1 V
Brady Statistic RA Percent Paced: 5.8 %
Brady Statistic RV Percent Paced: 99 %
Date Time Interrogation Session: 20241002150248
Implantable Lead Connection Status: 753985
Implantable Lead Connection Status: 753985
Implantable Lead Implant Date: 20240920
Implantable Lead Implant Date: 20240920
Implantable Lead Location: 753859
Implantable Lead Location: 753860
Implantable Pulse Generator Implant Date: 20240920
Lead Channel Impedance Value: 487.5 Ohm
Lead Channel Impedance Value: 525 Ohm
Lead Channel Pacing Threshold Amplitude: 0.5 V
Lead Channel Pacing Threshold Amplitude: 0.5 V
Lead Channel Pacing Threshold Amplitude: 0.75 V
Lead Channel Pacing Threshold Amplitude: 0.75 V
Lead Channel Pacing Threshold Pulse Width: 0.5 ms
Lead Channel Pacing Threshold Pulse Width: 0.5 ms
Lead Channel Pacing Threshold Pulse Width: 0.5 ms
Lead Channel Pacing Threshold Pulse Width: 0.5 ms
Lead Channel Sensing Intrinsic Amplitude: 12 mV
Lead Channel Sensing Intrinsic Amplitude: 5 mV
Lead Channel Setting Pacing Amplitude: 0.875
Lead Channel Setting Pacing Amplitude: 1.5 V
Lead Channel Setting Pacing Pulse Width: 0.5 ms
Lead Channel Setting Sensing Sensitivity: 2 mV
Pulse Gen Model: 2272
Pulse Gen Serial Number: 8213521

## 2023-09-04 NOTE — Patient Instructions (Signed)

## 2023-09-04 NOTE — Progress Notes (Signed)

## 2023-09-09 ENCOUNTER — Ambulatory Visit (HOSPITAL_COMMUNITY)
Admission: RE | Admit: 2023-09-09 | Discharge: 2023-09-09 | Disposition: A | Discharge status: Home / Self Care | Payer: Federal, State, Local not specified - PPO | Source: Ambulatory Visit | Attending: Cardiology | Admitting: Cardiology

## 2023-09-09 ENCOUNTER — Other Ambulatory Visit: Payer: Self-pay | Admitting: Medical Oncology

## 2023-09-09 DIAGNOSIS — I739 Peripheral vascular disease, unspecified: Secondary | ICD-10-CM | POA: Diagnosis not present

## 2023-09-09 NOTE — Progress Notes (Signed)
Orders entered

## 2023-09-10 ENCOUNTER — Inpatient Hospital Stay: Payer: Federal, State, Local not specified - PPO | Attending: Physician Assistant

## 2023-09-10 ENCOUNTER — Inpatient Hospital Stay: Payer: Federal, State, Local not specified - PPO | Admitting: Internal Medicine

## 2023-09-10 VITALS — BP 118/75 | HR 77 | Temp 97.6°F | Resp 16 | Ht 68.0 in | Wt 202.4 lb

## 2023-09-10 DIAGNOSIS — D649 Anemia, unspecified: Secondary | ICD-10-CM | POA: Diagnosis not present

## 2023-09-10 DIAGNOSIS — Z950 Presence of cardiac pacemaker: Secondary | ICD-10-CM | POA: Diagnosis not present

## 2023-09-10 DIAGNOSIS — Z85038 Personal history of other malignant neoplasm of large intestine: Secondary | ICD-10-CM | POA: Diagnosis not present

## 2023-09-10 DIAGNOSIS — I739 Peripheral vascular disease, unspecified: Secondary | ICD-10-CM | POA: Diagnosis not present

## 2023-09-10 DIAGNOSIS — Z8546 Personal history of malignant neoplasm of prostate: Secondary | ICD-10-CM | POA: Diagnosis not present

## 2023-09-10 DIAGNOSIS — K922 Gastrointestinal hemorrhage, unspecified: Secondary | ICD-10-CM | POA: Insufficient documentation

## 2023-09-10 DIAGNOSIS — D500 Iron deficiency anemia secondary to blood loss (chronic): Secondary | ICD-10-CM | POA: Diagnosis not present

## 2023-09-10 LAB — CBC WITH DIFFERENTIAL (CANCER CENTER ONLY)
Abs Immature Granulocytes: 0.02 10*3/uL (ref 0.00–0.07)
Basophils Absolute: 0 10*3/uL (ref 0.0–0.1)
Basophils Relative: 1 %
Eosinophils Absolute: 0.2 10*3/uL (ref 0.0–0.5)
Eosinophils Relative: 5 %
HCT: 39.5 % (ref 39.0–52.0)
Hemoglobin: 12.5 g/dL — ABNORMAL LOW (ref 13.0–17.0)
Immature Granulocytes: 1 %
Lymphocytes Relative: 29 %
Lymphs Abs: 1.3 10*3/uL (ref 0.7–4.0)
MCH: 29.8 pg (ref 26.0–34.0)
MCHC: 31.6 g/dL (ref 30.0–36.0)
MCV: 94 fL (ref 80.0–100.0)
Monocytes Absolute: 0.5 10*3/uL (ref 0.1–1.0)
Monocytes Relative: 11 %
Neutro Abs: 2.4 10*3/uL (ref 1.7–7.7)
Neutrophils Relative %: 53 %
Platelet Count: 213 10*3/uL (ref 150–400)
RBC: 4.2 MIL/uL — ABNORMAL LOW (ref 4.22–5.81)
RDW: 15.8 % — ABNORMAL HIGH (ref 11.5–15.5)
WBC Count: 4.4 10*3/uL (ref 4.0–10.5)
nRBC: 0 % (ref 0.0–0.2)

## 2023-09-10 LAB — IRON AND IRON BINDING CAPACITY (CC-WL,HP ONLY)
Iron: 228 ug/dL — ABNORMAL HIGH (ref 45–182)
Saturation Ratios: 50 % — ABNORMAL HIGH (ref 17.9–39.5)
TIBC: 459 ug/dL — ABNORMAL HIGH (ref 250–450)
UIBC: 231 ug/dL (ref 117–376)

## 2023-09-10 LAB — FERRITIN: Ferritin: 31 ng/mL (ref 24–336)

## 2023-09-10 LAB — FOLATE: Folate: 26.6 ng/mL (ref >5.9)

## 2023-09-10 NOTE — Progress Notes (Signed)
ATA Distal 47                          biphasic                           +-----------+--------+-----+---------------+----------+------------------------+ PTA Distal 58                          biphasic                           +-----------+--------+-----+---------------+----------+------------------------+ PERO Distal19                          monophasic                         +-----------+--------+-----+---------------+----------+------------------------+ A focal velocity elevation of 378 cm/s was obtained at with post stenotic turbulence with a VR of 4.6. Findings are characteristic of 76-99% stenosis.  Summary: Right: Moderate atherosclerosis throughout the right lower extremity arteries. 30-49% stenosis noted in the common femoral artery. High-end 50-74 vs. 75-99% stenosis of the superficial femoral artery.  Left: Moderate-severe atherosclerosis of the left lower extremity arteries. 30-49% stenosis noted in the common femoral artery. 75-99% stenosis of the distal SFA/above-knee popliteal artery. Unable to rule out short-segment occlusion due to poor insonation from heavily calcified plaque.  See table(s) above for measurements and observations. See ABI report. Suggest Peripheral Vascular Consult. Patient seeing Dr. Allyson Sabal on 09/27/23.    Preliminary    VAS [place] ABI WITH/WO TBI  Result Date: 09/09/2023  LOWER EXTREMITY DOPPLER STUDY Patient Name:  Jeremiah [name]  Date of Exam:   09/09/2023 Medical Rec #: [id number]    Accession #:    [id number] Date of Birth: May 07, 1946     Patient Gender: M Patient Age:   77 years Exam Location:  [facility] Procedure:      VAS [place] ABI WITH/WO TBI Referring  Phys: Christiane Ha BERRY --------------------------------------------------------------------------------  Indications: Claudication, and peripheral artery disease. High Risk Factors: Hypertension, hyperlipidemia, Diabetes, past history of                    smoking, prior MI, coronary artery disease.  Comparison Study: Segmental ABIs with exercise performed at [facility]                   on 08/08/23 showed resting right ABI of 0.92 with post-exercise                   drop to 0.42 and left resting ABI of 0.86 with post-exercise                   drop to 0.38. Performing Technologist: Olegario Shearer RVT  Examination Guidelines: A complete evaluation includes at minimum, Doppler waveform signals and systolic blood pressure reading at the level of bilateral brachial, anterior tibial, and posterior tibial arteries, when vessel segments are accessible. Bilateral testing is considered an integral part of a complete examination. Photoelectric Plethysmograph (PPG) waveforms and toe systolic pressure readings are included as required and additional duplex testing as needed. Limited examinations for reoccurring indications may be performed as noted.  ABI Findings: +---------+------------------+-----+--------+--------+ Right    Rt Pressure (mmHg)IndexWaveformComment  +---------+------------------+-----+--------+--------+ Brachial 137                                     +---------+------------------+-----+--------+--------+  UMBILICAL HERNIA REPAIR N/A 05/26/2022   Procedure: PRIMARY REPAIR OF STRANGULATED UMBILICAL HERNIA WITH PARTIAL OMENTECTOMY;  Surgeon: Almond Lint, MD;  Location: [facility] OR;  Service: General;  Laterality: N/A;    REVIEW OF SYSTEMS:  A comprehensive review of systems was negative.   PHYSICAL EXAMINATION: General appearance: alert, cooperative, and no distress Head: Normocephalic, without obvious abnormality, atraumatic Neck: no adenopathy, no JVD, supple, symmetrical, trachea midline, and thyroid not enlarged, symmetric, no tenderness/mass/nodules Lymph nodes: Cervical, supraclavicular, and axillary nodes normal. Resp: clear to auscultation bilaterally Back: symmetric, no curvature. ROM normal. No CVA tenderness. Cardio: regular rate and rhythm, S1, S2 normal, no murmur, click, rub or gallop GI: soft, non-tender; bowel sounds normal; no masses,  no organomegaly Extremities: extremities normal, atraumatic, no cyanosis or edema  ECOG PERFORMANCE STATUS: 1 - Symptomatic but completely ambulatory  Blood pressure 118/75, pulse 77, temperature 97.6 F (36.4 C), temperature source Oral, resp. rate 16, height  5\' 8"  (1.727 m), weight 202 lb 6.4 oz (91.8 kg), SpO2 97%.  LABORATORY DATA: Lab Results  Component Value Date   WBC 4.4 08/24/2023   HGB 9.5 (L) 08/24/2023   HCT 29.4 (L) 08/24/2023   MCV 95.1 08/24/2023   PLT 183 08/24/2023      Chemistry      Component Value Date/Time   NA 135 08/24/2023 0239   NA 142 03/21/2021 1217   K 3.7 08/24/2023 0239   CL 98 08/24/2023 0239   CO2 25 08/24/2023 0239   BUN 25 (H) 08/24/2023 0239   BUN 25 03/21/2021 1217   CREATININE 1.16 08/24/2023 0239   CREATININE 0.92 06/27/2023 1342      Component Value Date/Time   CALCIUM 8.7 (L) 08/24/2023 0239   ALKPHOS 54 08/22/2023 0607   AST 19 08/22/2023 0607   AST 19 06/27/2023 1342   ALT 16 08/22/2023 0607   ALT 19 06/27/2023 1342   BILITOT 1.3 (H) 08/22/2023 0607   BILITOT 0.3 06/27/2023 1342       RADIOGRAPHIC STUDIES: VAS [place] LOWER EXTREMITY ARTERIAL DUPLEX  Result Date: 09/09/2023 LOWER EXTREMITY ARTERIAL DUPLEX STUDY Patient Name:  Jeremiah [name]  Date of Exam:   09/09/2023 Medical Rec #: [id number]    Accession #:    [id number] Date of Birth: [birth year]     Patient Gender: M Patient Age:   77 years Exam Location:  [facility] Procedure:      VAS [place] LOWER EXTREMITY ARTERIAL DUPLEX Referring Phys: Christiane Ha BERRY --------------------------------------------------------------------------------  Indications: Claudication, and peripheral artery disease. Patient referred from              [name] for claudication symptoms. Patient reports bilateral calf              and foot pain that moves up the thighs when he is walking. He had a              segmental Doppler with exercise that showed significant drops in              ABIs with exercise. High Risk Factors: Hypertension, hyperlipidemia, Diabetes, past history of                    smoking, prior MI, coronary artery disease.  Current ABI: Right-0.86              Left-0.72 Performing Technologist: Olegario Shearer RVT  Examination Guidelines: A complete  evaluation includes B-mode imaging, spectral Doppler, color Doppler, and power Doppler as needed of  ATA Distal 47                          biphasic                           +-----------+--------+-----+---------------+----------+------------------------+ PTA Distal 58                          biphasic                           +-----------+--------+-----+---------------+----------+------------------------+ PERO Distal19                          monophasic                         +-----------+--------+-----+---------------+----------+------------------------+ A focal velocity elevation of 378 cm/s was obtained at with post stenotic turbulence with a VR of 4.6. Findings are characteristic of 76-99% stenosis.  Summary: Right: Moderate atherosclerosis throughout the right lower extremity arteries. 30-49% stenosis noted in the common femoral artery. High-end 50-74 vs. 75-99% stenosis of the superficial femoral artery.  Left: Moderate-severe atherosclerosis of the left lower extremity arteries. 30-49% stenosis noted in the common femoral artery. 75-99% stenosis of the distal SFA/above-knee popliteal artery. Unable to rule out short-segment occlusion due to poor insonation from heavily calcified plaque.  See table(s) above for measurements and observations. See ABI report. Suggest Peripheral Vascular Consult. Patient seeing Dr. Allyson Sabal on 09/27/23.    Preliminary    VAS [place] ABI WITH/WO TBI  Result Date: 09/09/2023  LOWER EXTREMITY DOPPLER STUDY Patient Name:  Jeremiah [name]  Date of Exam:   09/09/2023 Medical Rec #: [id number]    Accession #:    [id number] Date of Birth: May 07, 1946     Patient Gender: M Patient Age:   77 years Exam Location:  [facility] Procedure:      VAS [place] ABI WITH/WO TBI Referring  Phys: Christiane Ha BERRY --------------------------------------------------------------------------------  Indications: Claudication, and peripheral artery disease. High Risk Factors: Hypertension, hyperlipidemia, Diabetes, past history of                    smoking, prior MI, coronary artery disease.  Comparison Study: Segmental ABIs with exercise performed at [facility]                   on 08/08/23 showed resting right ABI of 0.92 with post-exercise                   drop to 0.42 and left resting ABI of 0.86 with post-exercise                   drop to 0.38. Performing Technologist: Olegario Shearer RVT  Examination Guidelines: A complete evaluation includes at minimum, Doppler waveform signals and systolic blood pressure reading at the level of bilateral brachial, anterior tibial, and posterior tibial arteries, when vessel segments are accessible. Bilateral testing is considered an integral part of a complete examination. Photoelectric Plethysmograph (PPG) waveforms and toe systolic pressure readings are included as required and additional duplex testing as needed. Limited examinations for reoccurring indications may be performed as noted.  ABI Findings: +---------+------------------+-----+--------+--------+ Right    Rt Pressure (mmHg)IndexWaveformComment  +---------+------------------+-----+--------+--------+ Brachial 137                                     +---------+------------------+-----+--------+--------+  ATA Distal 47                          biphasic                           +-----------+--------+-----+---------------+----------+------------------------+ PTA Distal 58                          biphasic                           +-----------+--------+-----+---------------+----------+------------------------+ PERO Distal19                          monophasic                         +-----------+--------+-----+---------------+----------+------------------------+ A focal velocity elevation of 378 cm/s was obtained at with post stenotic turbulence with a VR of 4.6. Findings are characteristic of 76-99% stenosis.  Summary: Right: Moderate atherosclerosis throughout the right lower extremity arteries. 30-49% stenosis noted in the common femoral artery. High-end 50-74 vs. 75-99% stenosis of the superficial femoral artery.  Left: Moderate-severe atherosclerosis of the left lower extremity arteries. 30-49% stenosis noted in the common femoral artery. 75-99% stenosis of the distal SFA/above-knee popliteal artery. Unable to rule out short-segment occlusion due to poor insonation from heavily calcified plaque.  See table(s) above for measurements and observations. See ABI report. Suggest Peripheral Vascular Consult. Patient seeing Dr. Allyson Sabal on 09/27/23.    Preliminary    VAS [place] ABI WITH/WO TBI  Result Date: 09/09/2023  LOWER EXTREMITY DOPPLER STUDY Patient Name:  Jeremiah [name]  Date of Exam:   09/09/2023 Medical Rec #: [id number]    Accession #:    [id number] Date of Birth: May 07, 1946     Patient Gender: M Patient Age:   77 years Exam Location:  [facility] Procedure:      VAS [place] ABI WITH/WO TBI Referring  Phys: Christiane Ha BERRY --------------------------------------------------------------------------------  Indications: Claudication, and peripheral artery disease. High Risk Factors: Hypertension, hyperlipidemia, Diabetes, past history of                    smoking, prior MI, coronary artery disease.  Comparison Study: Segmental ABIs with exercise performed at [facility]                   on 08/08/23 showed resting right ABI of 0.92 with post-exercise                   drop to 0.42 and left resting ABI of 0.86 with post-exercise                   drop to 0.38. Performing Technologist: Olegario Shearer RVT  Examination Guidelines: A complete evaluation includes at minimum, Doppler waveform signals and systolic blood pressure reading at the level of bilateral brachial, anterior tibial, and posterior tibial arteries, when vessel segments are accessible. Bilateral testing is considered an integral part of a complete examination. Photoelectric Plethysmograph (PPG) waveforms and toe systolic pressure readings are included as required and additional duplex testing as needed. Limited examinations for reoccurring indications may be performed as noted.  ABI Findings: +---------+------------------+-----+--------+--------+ Right    Rt Pressure (mmHg)IndexWaveformComment  +---------+------------------+-----+--------+--------+ Brachial 137                                     +---------+------------------+-----+--------+--------+  ATA Distal 47                          biphasic                           +-----------+--------+-----+---------------+----------+------------------------+ PTA Distal 58                          biphasic                           +-----------+--------+-----+---------------+----------+------------------------+ PERO Distal19                          monophasic                         +-----------+--------+-----+---------------+----------+------------------------+ A focal velocity elevation of 378 cm/s was obtained at with post stenotic turbulence with a VR of 4.6. Findings are characteristic of 76-99% stenosis.  Summary: Right: Moderate atherosclerosis throughout the right lower extremity arteries. 30-49% stenosis noted in the common femoral artery. High-end 50-74 vs. 75-99% stenosis of the superficial femoral artery.  Left: Moderate-severe atherosclerosis of the left lower extremity arteries. 30-49% stenosis noted in the common femoral artery. 75-99% stenosis of the distal SFA/above-knee popliteal artery. Unable to rule out short-segment occlusion due to poor insonation from heavily calcified plaque.  See table(s) above for measurements and observations. See ABI report. Suggest Peripheral Vascular Consult. Patient seeing Dr. Allyson Sabal on 09/27/23.    Preliminary    VAS [place] ABI WITH/WO TBI  Result Date: 09/09/2023  LOWER EXTREMITY DOPPLER STUDY Patient Name:  Jeremiah [name]  Date of Exam:   09/09/2023 Medical Rec #: [id number]    Accession #:    [id number] Date of Birth: May 07, 1946     Patient Gender: M Patient Age:   77 years Exam Location:  [facility] Procedure:      VAS [place] ABI WITH/WO TBI Referring  Phys: Christiane Ha BERRY --------------------------------------------------------------------------------  Indications: Claudication, and peripheral artery disease. High Risk Factors: Hypertension, hyperlipidemia, Diabetes, past history of                    smoking, prior MI, coronary artery disease.  Comparison Study: Segmental ABIs with exercise performed at [facility]                   on 08/08/23 showed resting right ABI of 0.92 with post-exercise                   drop to 0.42 and left resting ABI of 0.86 with post-exercise                   drop to 0.38. Performing Technologist: Olegario Shearer RVT  Examination Guidelines: A complete evaluation includes at minimum, Doppler waveform signals and systolic blood pressure reading at the level of bilateral brachial, anterior tibial, and posterior tibial arteries, when vessel segments are accessible. Bilateral testing is considered an integral part of a complete examination. Photoelectric Plethysmograph (PPG) waveforms and toe systolic pressure readings are included as required and additional duplex testing as needed. Limited examinations for reoccurring indications may be performed as noted.  ABI Findings: +---------+------------------+-----+--------+--------+ Right    Rt Pressure (mmHg)IndexWaveformComment  +---------+------------------+-----+--------+--------+ Brachial 137                                     +---------+------------------+-----+--------+--------+  UMBILICAL HERNIA REPAIR N/A 05/26/2022   Procedure: PRIMARY REPAIR OF STRANGULATED UMBILICAL HERNIA WITH PARTIAL OMENTECTOMY;  Surgeon: Almond Lint, MD;  Location: [facility] OR;  Service: General;  Laterality: N/A;    REVIEW OF SYSTEMS:  A comprehensive review of systems was negative.   PHYSICAL EXAMINATION: General appearance: alert, cooperative, and no distress Head: Normocephalic, without obvious abnormality, atraumatic Neck: no adenopathy, no JVD, supple, symmetrical, trachea midline, and thyroid not enlarged, symmetric, no tenderness/mass/nodules Lymph nodes: Cervical, supraclavicular, and axillary nodes normal. Resp: clear to auscultation bilaterally Back: symmetric, no curvature. ROM normal. No CVA tenderness. Cardio: regular rate and rhythm, S1, S2 normal, no murmur, click, rub or gallop GI: soft, non-tender; bowel sounds normal; no masses,  no organomegaly Extremities: extremities normal, atraumatic, no cyanosis or edema  ECOG PERFORMANCE STATUS: 1 - Symptomatic but completely ambulatory  Blood pressure 118/75, pulse 77, temperature 97.6 F (36.4 C), temperature source Oral, resp. rate 16, height  5\' 8"  (1.727 m), weight 202 lb 6.4 oz (91.8 kg), SpO2 97%.  LABORATORY DATA: Lab Results  Component Value Date   WBC 4.4 08/24/2023   HGB 9.5 (L) 08/24/2023   HCT 29.4 (L) 08/24/2023   MCV 95.1 08/24/2023   PLT 183 08/24/2023      Chemistry      Component Value Date/Time   NA 135 08/24/2023 0239   NA 142 03/21/2021 1217   K 3.7 08/24/2023 0239   CL 98 08/24/2023 0239   CO2 25 08/24/2023 0239   BUN 25 (H) 08/24/2023 0239   BUN 25 03/21/2021 1217   CREATININE 1.16 08/24/2023 0239   CREATININE 0.92 06/27/2023 1342      Component Value Date/Time   CALCIUM 8.7 (L) 08/24/2023 0239   ALKPHOS 54 08/22/2023 0607   AST 19 08/22/2023 0607   AST 19 06/27/2023 1342   ALT 16 08/22/2023 0607   ALT 19 06/27/2023 1342   BILITOT 1.3 (H) 08/22/2023 0607   BILITOT 0.3 06/27/2023 1342       RADIOGRAPHIC STUDIES: VAS [place] LOWER EXTREMITY ARTERIAL DUPLEX  Result Date: 09/09/2023 LOWER EXTREMITY ARTERIAL DUPLEX STUDY Patient Name:  Jeremiah [name]  Date of Exam:   09/09/2023 Medical Rec #: [id number]    Accession #:    [id number] Date of Birth: [birth year]     Patient Gender: M Patient Age:   77 years Exam Location:  [facility] Procedure:      VAS [place] LOWER EXTREMITY ARTERIAL DUPLEX Referring Phys: Christiane Ha BERRY --------------------------------------------------------------------------------  Indications: Claudication, and peripheral artery disease. Patient referred from              [name] for claudication symptoms. Patient reports bilateral calf              and foot pain that moves up the thighs when he is walking. He had a              segmental Doppler with exercise that showed significant drops in              ABIs with exercise. High Risk Factors: Hypertension, hyperlipidemia, Diabetes, past history of                    smoking, prior MI, coronary artery disease.  Current ABI: Right-0.86              Left-0.72 Performing Technologist: Olegario Shearer RVT  Examination Guidelines: A complete  evaluation includes B-mode imaging, spectral Doppler, color Doppler, and power Doppler as needed of  UMBILICAL HERNIA REPAIR N/A 05/26/2022   Procedure: PRIMARY REPAIR OF STRANGULATED UMBILICAL HERNIA WITH PARTIAL OMENTECTOMY;  Surgeon: Almond Lint, MD;  Location: [facility];  Service: General;  Laterality: N/A;    REVIEW OF SYSTEMS:  A comprehensive review of systems was negative.   PHYSICAL EXAMINATION: General appearance: alert, cooperative, and no distress Head: Normocephalic, without obvious abnormality, atraumatic Neck: no adenopathy, no JVD, supple, symmetrical, trachea midline, and thyroid not enlarged, symmetric, no tenderness/mass/nodules Lymph nodes: Cervical, supraclavicular, and axillary nodes normal. Resp: clear to auscultation bilaterally Back: symmetric, no curvature. ROM normal. No CVA tenderness. Cardio: regular rate and rhythm, S1, S2 normal, no murmur, click, rub or gallop GI: soft, non-tender; bowel sounds normal; no masses,  no organomegaly Extremities: extremities normal, atraumatic, no cyanosis or edema  ECOG PERFORMANCE STATUS: 1 - Symptomatic but completely ambulatory  Blood pressure 118/75, pulse 77, temperature 97.6 F (36.4 C), temperature source Oral, resp. rate 16, height  5\' 8"  (1.727 m), weight 202 lb 6.4 oz (91.8 kg), SpO2 97%.  LABORATORY DATA: Lab Results  Component Value Date   WBC 4.4 08/24/2023   HGB 9.5 (L) 08/24/2023   HCT 29.4 (L) 08/24/2023   MCV 95.1 08/24/2023   PLT 183 08/24/2023      Chemistry      Component Value Date/Time   NA 135 08/24/2023 0239   NA 142 03/21/2021 1217   K 3.7 08/24/2023 0239   CL 98 08/24/2023 0239   CO2 25 08/24/2023 0239   BUN 25 (H) 08/24/2023 0239   BUN 25 03/21/2021 1217   CREATININE 1.16 08/24/2023 0239   CREATININE 0.92 06/27/2023 1342      Component Value Date/Time   CALCIUM 8.7 (L) 08/24/2023 0239   ALKPHOS 54 08/22/2023 0607   AST 19 08/22/2023 0607   AST 19 06/27/2023 1342   ALT 16 08/22/2023 0607   ALT 19 06/27/2023 1342   BILITOT 1.3 (H) 08/22/2023 0607   BILITOT 0.3 06/27/2023 1342       RADIOGRAPHIC STUDIES: VAS [place] LOWER EXTREMITY ARTERIAL DUPLEX  Result Date: 09/09/2023 LOWER EXTREMITY ARTERIAL DUPLEX STUDY Patient Name:  Jeremiah [name]  Date of Exam:   09/09/2023 Medical Rec #: [id number]    Accession #:    [id number] Date of Birth: [birth year]     Patient Gender: M Patient Age:   77 years Exam Location:  [facility] Procedure:      VAS [place] LOWER EXTREMITY ARTERIAL DUPLEX Referring Phys: Christiane Ha BERRY --------------------------------------------------------------------------------  Indications: Claudication, and peripheral artery disease. Patient referred from              [name] for claudication symptoms. Patient reports bilateral calf              and foot pain that moves up the thighs when he is walking. He had a              segmental Doppler with exercise that showed significant drops in              ABIs with exercise. High Risk Factors: Hypertension, hyperlipidemia, Diabetes, past history of                    smoking, prior MI, coronary artery disease.  Current ABI: Right-0.86              Left-0.72 Performing Technologist: Olegario Shearer RVT  Examination Guidelines: A complete  evaluation includes B-mode imaging, spectral Doppler, color Doppler, and power Doppler as needed of  UMBILICAL HERNIA REPAIR N/A 05/26/2022   Procedure: PRIMARY REPAIR OF STRANGULATED UMBILICAL HERNIA WITH PARTIAL OMENTECTOMY;  Surgeon: Almond Lint, MD;  Location: [facility];  Service: General;  Laterality: N/A;    REVIEW OF SYSTEMS:  A comprehensive review of systems was negative.   PHYSICAL EXAMINATION: General appearance: alert, cooperative, and no distress Head: Normocephalic, without obvious abnormality, atraumatic Neck: no adenopathy, no JVD, supple, symmetrical, trachea midline, and thyroid not enlarged, symmetric, no tenderness/mass/nodules Lymph nodes: Cervical, supraclavicular, and axillary nodes normal. Resp: clear to auscultation bilaterally Back: symmetric, no curvature. ROM normal. No CVA tenderness. Cardio: regular rate and rhythm, S1, S2 normal, no murmur, click, rub or gallop GI: soft, non-tender; bowel sounds normal; no masses,  no organomegaly Extremities: extremities normal, atraumatic, no cyanosis or edema  ECOG PERFORMANCE STATUS: 1 - Symptomatic but completely ambulatory  Blood pressure 118/75, pulse 77, temperature 97.6 F (36.4 C), temperature source Oral, resp. rate 16, height  5\' 8"  (1.727 m), weight 202 lb 6.4 oz (91.8 kg), SpO2 97%.  LABORATORY DATA: Lab Results  Component Value Date   WBC 4.4 08/24/2023   HGB 9.5 (L) 08/24/2023   HCT 29.4 (L) 08/24/2023   MCV 95.1 08/24/2023   PLT 183 08/24/2023      Chemistry      Component Value Date/Time   NA 135 08/24/2023 0239   NA 142 03/21/2021 1217   K 3.7 08/24/2023 0239   CL 98 08/24/2023 0239   CO2 25 08/24/2023 0239   BUN 25 (H) 08/24/2023 0239   BUN 25 03/21/2021 1217   CREATININE 1.16 08/24/2023 0239   CREATININE 0.92 06/27/2023 1342      Component Value Date/Time   CALCIUM 8.7 (L) 08/24/2023 0239   ALKPHOS 54 08/22/2023 0607   AST 19 08/22/2023 0607   AST 19 06/27/2023 1342   ALT 16 08/22/2023 0607   ALT 19 06/27/2023 1342   BILITOT 1.3 (H) 08/22/2023 0607   BILITOT 0.3 06/27/2023 1342       RADIOGRAPHIC STUDIES: VAS [place] LOWER EXTREMITY ARTERIAL DUPLEX  Result Date: 09/09/2023 LOWER EXTREMITY ARTERIAL DUPLEX STUDY Patient Name:  Jeremiah [name]  Date of Exam:   09/09/2023 Medical Rec #: [id number]    Accession #:    [id number] Date of Birth: [birth year]     Patient Gender: M Patient Age:   77 years Exam Location:  [facility] Procedure:      VAS [place] LOWER EXTREMITY ARTERIAL DUPLEX Referring Phys: Christiane Ha BERRY --------------------------------------------------------------------------------  Indications: Claudication, and peripheral artery disease. Patient referred from              [name] for claudication symptoms. Patient reports bilateral calf              and foot pain that moves up the thighs when he is walking. He had a              segmental Doppler with exercise that showed significant drops in              ABIs with exercise. High Risk Factors: Hypertension, hyperlipidemia, Diabetes, past history of                    smoking, prior MI, coronary artery disease.  Current ABI: Right-0.86              Left-0.72 Performing Technologist: Olegario Shearer RVT  Examination Guidelines: A complete  evaluation includes B-mode imaging, spectral Doppler, color Doppler, and power Doppler as needed of  ATA Distal 47                          biphasic                           +-----------+--------+-----+---------------+----------+------------------------+ PTA Distal 58                          biphasic                           +-----------+--------+-----+---------------+----------+------------------------+ PERO Distal19                          monophasic                         +-----------+--------+-----+---------------+----------+------------------------+ A focal velocity elevation of 378 cm/s was obtained at with post stenotic turbulence with a VR of 4.6. Findings are characteristic of 76-99% stenosis.  Summary: Right: Moderate atherosclerosis throughout the right lower extremity arteries. 30-49% stenosis noted in the common femoral artery. High-end 50-74 vs. 75-99% stenosis of the superficial femoral artery.  Left: Moderate-severe atherosclerosis of the left lower extremity arteries. 30-49% stenosis noted in the common femoral artery. 75-99% stenosis of the distal SFA/above-knee popliteal artery. Unable to rule out short-segment occlusion due to poor insonation from heavily calcified plaque.  See table(s) above for measurements and observations. See ABI report. Suggest Peripheral Vascular Consult. Patient seeing Dr. Allyson Sabal on 09/27/23.    Preliminary    VAS [place] ABI WITH/WO TBI  Result Date: 09/09/2023  LOWER EXTREMITY DOPPLER STUDY Patient Name:  Jeremiah [name]  Date of Exam:   09/09/2023 Medical Rec #: [id number]    Accession #:    [id number] Date of Birth: May 07, 1946     Patient Gender: M Patient Age:   77 years Exam Location:  [facility] Procedure:      VAS [place] ABI WITH/WO TBI Referring  Phys: Christiane Ha BERRY --------------------------------------------------------------------------------  Indications: Claudication, and peripheral artery disease. High Risk Factors: Hypertension, hyperlipidemia, Diabetes, past history of                    smoking, prior MI, coronary artery disease.  Comparison Study: Segmental ABIs with exercise performed at [facility]                   on 08/08/23 showed resting right ABI of 0.92 with post-exercise                   drop to 0.42 and left resting ABI of 0.86 with post-exercise                   drop to 0.38. Performing Technologist: Olegario Shearer RVT  Examination Guidelines: A complete evaluation includes at minimum, Doppler waveform signals and systolic blood pressure reading at the level of bilateral brachial, anterior tibial, and posterior tibial arteries, when vessel segments are accessible. Bilateral testing is considered an integral part of a complete examination. Photoelectric Plethysmograph (PPG) waveforms and toe systolic pressure readings are included as required and additional duplex testing as needed. Limited examinations for reoccurring indications may be performed as noted.  ABI Findings: +---------+------------------+-----+--------+--------+ Right    Rt Pressure (mmHg)IndexWaveformComment  +---------+------------------+-----+--------+--------+ Brachial 137                                     +---------+------------------+-----+--------+--------+

## 2023-09-11 ENCOUNTER — Telehealth: Payer: Self-pay

## 2023-09-11 NOTE — Telephone Encounter (Addendum)
TC from pt, inquiring about iron lab results from yesterday. He is unsure whether to continue taking Iron supplement. Consulted Dr. Asa Lente PA, C. Heilingoetter and received the following response: "You can tell him that his iron is better It actually has his iron and saturations high. His ferritin is still 31 though. Sometimes when people have a lot of iron in the blood (it makes the iron high in the blood) but overall his storage is normal/low end of normal so I think he is fine to continue taking his iron how he normally takes it. I don't think he needs to stop or reduce the iron. I would tell him to continue taking it daily or every other day with orange juice." I relayed this information to the pt and he verbalizes understanding. Pt asks that a copy of his labs be faxed to his PCP, Dr. Gwenlyn Found. Verbal consent obtained w/ 2 pt identifier. Labs faxed to [phone].

## 2023-09-16 DIAGNOSIS — E611 Iron deficiency: Secondary | ICD-10-CM | POA: Diagnosis not present

## 2023-09-27 ENCOUNTER — Encounter: Payer: Self-pay | Admitting: Cardiovascular Disease

## 2023-09-27 ENCOUNTER — Ambulatory Visit: Payer: Federal, State, Local not specified - PPO | Admitting: Cardiovascular Disease

## 2023-09-27 ENCOUNTER — Ambulatory Visit: Payer: Federal, State, Local not specified - PPO | Attending: Cardiovascular Disease | Admitting: Cardiovascular Disease

## 2023-09-27 VITALS — BP 114/68 | HR 84 | Ht 67.0 in | Wt 207.8 lb

## 2023-09-27 DIAGNOSIS — I739 Peripheral vascular disease, unspecified: Secondary | ICD-10-CM

## 2023-09-27 NOTE — Progress Notes (Signed)
Jeremiah [name] returns today for follow-up of his Doppler studies.  I initially saw him as a referral from [name] on 08/13/2023 because of claudication.  He is accompanied by his wife [name] today.  He does have a complex medical history including remote CABG in 2006, SVT ablation.  He is recently mated with heart failure and underwent permanent transvenous pacemaker implantation and diuresis.  According to the patient he has had endovascular treatment with what sounds like his SMA and celiac axis at [facility].  He has had GI bleeding in the past.  Dopplers performed 09/09/2023 revealed a right ABI of 0.86, left of 0.72 with high-frequency signal in the mid right SFA and left popliteal artery.  He still complains of claudication which is lifestyle limiting.  I was going to pursue Pletal but unfortunately this causes QTc prolongation.  I will see him back in 3 months for follow-up.  If he has got no significant clinical benefit will proceed with an endovascular approach.  In the meantime, we will obtain his records from [facility] with regards to his prior abdominal vascular procedures and what follow-up needs to be performed.  Runell Gess, M.D., FACP, [facility], [facility] [address] [place], [place]  [address]  [phone] 09/27/2023 10:41 AM

## 2023-09-27 NOTE — Patient Instructions (Signed)
Medication Instructions:  Your physician recommends that you continue on your current medications as directed. Please refer to the Current Medication list given to you today.   Testing/Procedures: Your physician has requested that you have an Aorta/Iliac Duplex. This will be take place at [address] [facility], [address].  No food after 11PM the night before.  Water is OK. (Don't drink liquids if you have been instructed not to for ANOTHER test) Avoid foods that produce bowel gas, for 24 hours prior to exam (see below). No breakfast, no chewing gum, no smoking or carbonated beverages. Patient may take morning medications with water. Come in for test at least 15 minutes early to register.    Follow-Up: At [facility], you and your health needs are our priority.  As part of our continuing mission to provide you with exceptional heart care, we have created designated Provider Care Teams.  These Care Teams include your primary Cardiologist (physician) and Advanced Practice Providers (APPs -  Physician Assistants and Nurse Practitioners) who all work together to provide you with the care you need, when you need it.  Your next appointment:   3 month(s)  Provider:   Dr. Nanetta Batty

## 2023-10-02 ENCOUNTER — Other Ambulatory Visit: Payer: Self-pay | Admitting: Family Medicine

## 2023-10-02 ENCOUNTER — Institutional Professional Consult (permissible substitution): Payer: Federal, State, Local not specified - PPO | Admitting: Cardiology

## 2023-10-02 ENCOUNTER — Ambulatory Visit
Admission: RE | Admit: 2023-10-02 | Discharge: 2023-10-02 | Disposition: A | Discharge status: Home / Self Care | Payer: Federal, State, Local not specified - PPO | Source: Ambulatory Visit | Attending: Family Medicine | Admitting: Family Medicine

## 2023-10-02 DIAGNOSIS — R0602 Shortness of breath: Secondary | ICD-10-CM

## 2023-10-02 DIAGNOSIS — Z03818 Encounter for observation for suspected exposure to other biological agents ruled out: Secondary | ICD-10-CM | POA: Diagnosis not present

## 2023-10-03 ENCOUNTER — Telehealth: Payer: Self-pay | Admitting: Medical Oncology

## 2023-10-03 NOTE — Telephone Encounter (Signed)
Requested recent labs from PCP °

## 2023-10-04 ENCOUNTER — Telehealth: Payer: Self-pay | Admitting: *Deleted

## 2023-10-04 DIAGNOSIS — K922 Gastrointestinal hemorrhage, unspecified: Secondary | ICD-10-CM | POA: Diagnosis not present

## 2023-10-04 DIAGNOSIS — R0602 Shortness of breath: Secondary | ICD-10-CM | POA: Diagnosis not present

## 2023-10-04 NOTE — Telephone Encounter (Signed)
Received call from pt. He states his HGB has dropped fro 12.5 to 8.4 since 09/10/23. He states his stools are black and sticky-tar-like.His PCP told him to come here for transfusions. Advised that Dr. Arbutus Ped nor his PA are here today. Also advised that since he has a h/o GI bleed, it would be best for him to go to the ED for eval and transfusions as it sounds like he is bleeding again. Pt did want to come here but was advised that it was best for him to go to the ED. He voiced understanding and will go to the ED

## 2023-10-05 ENCOUNTER — Inpatient Hospital Stay (HOSPITAL_COMMUNITY)
Admission: EM | Admit: 2023-10-05 | Discharge: 2023-10-10 | DRG: 378 | Disposition: A | Discharge status: Home / Self Care | Payer: Federal, State, Local not specified - PPO | Attending: Family Medicine | Admitting: Family Medicine

## 2023-10-05 ENCOUNTER — Emergency Department (HOSPITAL_COMMUNITY): Payer: Federal, State, Local not specified - PPO

## 2023-10-05 ENCOUNTER — Encounter (HOSPITAL_COMMUNITY): Payer: Self-pay | Admitting: Student

## 2023-10-05 ENCOUNTER — Inpatient Hospital Stay (HOSPITAL_COMMUNITY): Payer: Federal, State, Local not specified - PPO

## 2023-10-05 ENCOUNTER — Other Ambulatory Visit: Payer: Self-pay

## 2023-10-05 DIAGNOSIS — Z7982 Long term (current) use of aspirin: Secondary | ICD-10-CM

## 2023-10-05 DIAGNOSIS — K5520 Angiodysplasia of colon without hemorrhage: Secondary | ICD-10-CM | POA: Diagnosis not present

## 2023-10-05 DIAGNOSIS — Z87891 Personal history of nicotine dependence: Secondary | ICD-10-CM | POA: Diagnosis not present

## 2023-10-05 DIAGNOSIS — I2510 Atherosclerotic heart disease of native coronary artery without angina pectoris: Secondary | ICD-10-CM | POA: Diagnosis present

## 2023-10-05 DIAGNOSIS — D62 Acute posthemorrhagic anemia: Secondary | ICD-10-CM | POA: Diagnosis not present

## 2023-10-05 DIAGNOSIS — Z86718 Personal history of other venous thrombosis and embolism: Secondary | ICD-10-CM

## 2023-10-05 DIAGNOSIS — Z95828 Presence of other vascular implants and grafts: Secondary | ICD-10-CM

## 2023-10-05 DIAGNOSIS — Z951 Presence of aortocoronary bypass graft: Secondary | ICD-10-CM | POA: Diagnosis not present

## 2023-10-05 DIAGNOSIS — I252 Old myocardial infarction: Secondary | ICD-10-CM

## 2023-10-05 DIAGNOSIS — E7800 Pure hypercholesterolemia, unspecified: Secondary | ICD-10-CM | POA: Diagnosis not present

## 2023-10-05 DIAGNOSIS — I083 Combined rheumatic disorders of mitral, aortic and tricuspid valves: Secondary | ICD-10-CM | POA: Diagnosis not present

## 2023-10-05 DIAGNOSIS — D649 Anemia, unspecified: Secondary | ICD-10-CM | POA: Diagnosis not present

## 2023-10-05 DIAGNOSIS — E1151 Type 2 diabetes mellitus with diabetic peripheral angiopathy without gangrene: Secondary | ICD-10-CM | POA: Diagnosis present

## 2023-10-05 DIAGNOSIS — Z91013 Allergy to seafood: Secondary | ICD-10-CM

## 2023-10-05 DIAGNOSIS — Z79899 Other long term (current) drug therapy: Secondary | ICD-10-CM

## 2023-10-05 DIAGNOSIS — K219 Gastro-esophageal reflux disease without esophagitis: Secondary | ICD-10-CM | POA: Diagnosis present

## 2023-10-05 DIAGNOSIS — K5521 Angiodysplasia of colon with hemorrhage: Principal | ICD-10-CM | POA: Diagnosis present

## 2023-10-05 DIAGNOSIS — Z9889 Other specified postprocedural states: Secondary | ICD-10-CM

## 2023-10-05 DIAGNOSIS — Z9981 Dependence on supplemental oxygen: Secondary | ICD-10-CM | POA: Diagnosis not present

## 2023-10-05 DIAGNOSIS — G8929 Other chronic pain: Secondary | ICD-10-CM | POA: Diagnosis present

## 2023-10-05 DIAGNOSIS — I4891 Unspecified atrial fibrillation: Secondary | ICD-10-CM | POA: Diagnosis present

## 2023-10-05 DIAGNOSIS — Z7951 Long term (current) use of inhaled steroids: Secondary | ICD-10-CM

## 2023-10-05 DIAGNOSIS — Z8546 Personal history of malignant neoplasm of prostate: Secondary | ICD-10-CM

## 2023-10-05 DIAGNOSIS — E039 Hypothyroidism, unspecified: Secondary | ICD-10-CM | POA: Diagnosis present

## 2023-10-05 DIAGNOSIS — J9611 Chronic respiratory failure with hypoxia: Secondary | ICD-10-CM | POA: Diagnosis present

## 2023-10-05 DIAGNOSIS — R0602 Shortness of breath: Secondary | ICD-10-CM | POA: Diagnosis present

## 2023-10-05 DIAGNOSIS — I110 Hypertensive heart disease with heart failure: Secondary | ICD-10-CM | POA: Diagnosis present

## 2023-10-05 DIAGNOSIS — Z8701 Personal history of pneumonia (recurrent): Secondary | ICD-10-CM

## 2023-10-05 DIAGNOSIS — I272 Pulmonary hypertension, unspecified: Secondary | ICD-10-CM | POA: Diagnosis not present

## 2023-10-05 DIAGNOSIS — I5032 Chronic diastolic (congestive) heart failure: Secondary | ICD-10-CM | POA: Diagnosis not present

## 2023-10-05 DIAGNOSIS — Z7984 Long term (current) use of oral hypoglycemic drugs: Secondary | ICD-10-CM

## 2023-10-05 DIAGNOSIS — Z9861 Coronary angioplasty status: Secondary | ICD-10-CM

## 2023-10-05 DIAGNOSIS — Z8679 Personal history of other diseases of the circulatory system: Secondary | ICD-10-CM

## 2023-10-05 DIAGNOSIS — I4719 Other supraventricular tachycardia: Secondary | ICD-10-CM | POA: Diagnosis not present

## 2023-10-05 DIAGNOSIS — Z85038 Personal history of other malignant neoplasm of large intestine: Secondary | ICD-10-CM

## 2023-10-05 DIAGNOSIS — D509 Iron deficiency anemia, unspecified: Secondary | ICD-10-CM

## 2023-10-05 DIAGNOSIS — K922 Gastrointestinal hemorrhage, unspecified: Secondary | ICD-10-CM | POA: Diagnosis not present

## 2023-10-05 DIAGNOSIS — F5101 Primary insomnia: Secondary | ICD-10-CM | POA: Diagnosis present

## 2023-10-05 DIAGNOSIS — Z887 Allergy status to serum and vaccine status: Secondary | ICD-10-CM

## 2023-10-05 DIAGNOSIS — G4733 Obstructive sleep apnea (adult) (pediatric): Secondary | ICD-10-CM | POA: Diagnosis present

## 2023-10-05 DIAGNOSIS — Z950 Presence of cardiac pacemaker: Secondary | ICD-10-CM

## 2023-10-05 DIAGNOSIS — J449 Chronic obstructive pulmonary disease, unspecified: Secondary | ICD-10-CM | POA: Diagnosis present

## 2023-10-05 DIAGNOSIS — Z79890 Hormone replacement therapy (postmenopausal): Secondary | ICD-10-CM

## 2023-10-05 DIAGNOSIS — D500 Iron deficiency anemia secondary to blood loss (chronic): Secondary | ICD-10-CM | POA: Diagnosis not present

## 2023-10-05 DIAGNOSIS — Z820 Family history of epilepsy and other diseases of the nervous system: Secondary | ICD-10-CM

## 2023-10-05 DIAGNOSIS — I700 Atherosclerosis of aorta: Secondary | ICD-10-CM | POA: Diagnosis not present

## 2023-10-05 DIAGNOSIS — Z8719 Personal history of other diseases of the digestive system: Secondary | ICD-10-CM

## 2023-10-05 DIAGNOSIS — Z8619 Personal history of other infectious and parasitic diseases: Secondary | ICD-10-CM

## 2023-10-05 DIAGNOSIS — K5730 Diverticulosis of large intestine without perforation or abscess without bleeding: Secondary | ICD-10-CM | POA: Diagnosis not present

## 2023-10-05 DIAGNOSIS — Z833 Family history of diabetes mellitus: Secondary | ICD-10-CM

## 2023-10-05 LAB — CBC
HCT: 20.9 % — ABNORMAL LOW (ref 39.0–52.0)
Hemoglobin: 6.6 g/dL — CL (ref 13.0–17.0)
MCH: 30.8 pg (ref 26.0–34.0)
MCHC: 31.6 g/dL (ref 30.0–36.0)
MCV: 97.7 fL (ref 80.0–100.0)
Platelets: 213 10*3/uL (ref 150–400)
RBC: 2.14 MIL/uL — ABNORMAL LOW (ref 4.22–5.81)
RDW: 18.6 % — ABNORMAL HIGH (ref 11.5–15.5)
WBC: 5.3 10*3/uL (ref 4.0–10.5)
nRBC: 0.4 % — ABNORMAL HIGH (ref 0.0–0.2)

## 2023-10-05 LAB — HEMOGLOBIN AND HEMATOCRIT, BLOOD
HCT: 25.6 % — ABNORMAL LOW (ref 39.0–52.0)
Hemoglobin: 8.8 g/dL — ABNORMAL LOW (ref 13.0–17.0)

## 2023-10-05 LAB — COMPREHENSIVE METABOLIC PANEL
ALT: 17 U/L (ref 0–44)
AST: 17 U/L (ref 15–41)
Albumin: 3.8 g/dL (ref 3.5–5.0)
Alkaline Phosphatase: 60 U/L (ref 38–126)
Anion gap: 8 (ref 5–15)
BUN: 32 mg/dL — ABNORMAL HIGH (ref 8–23)
CO2: 27 mmol/L (ref 22–32)
Calcium: 8.8 mg/dL — ABNORMAL LOW (ref 8.9–10.3)
Chloride: 99 mmol/L (ref 98–111)
Creatinine, Ser: 1 mg/dL (ref 0.61–1.24)
GFR, Estimated: >60 mL/min (ref >60)
Glucose, Bld: 190 mg/dL — ABNORMAL HIGH (ref 70–99)
Potassium: 3.6 mmol/L (ref 3.5–5.1)
Sodium: 134 mmol/L — ABNORMAL LOW (ref 135–145)
Total Bilirubin: 0.7 mg/dL (ref 0.3–1.2)
Total Protein: 7 g/dL (ref 6.5–8.1)

## 2023-10-05 LAB — GLUCOSE, CAPILLARY
Glucose-Capillary: 162 mg/dL — ABNORMAL HIGH (ref 70–99)
Glucose-Capillary: 214 mg/dL — ABNORMAL HIGH (ref 70–99)

## 2023-10-05 LAB — TSH: TSH: 4.272 u[IU]/mL (ref 0.350–4.500)

## 2023-10-05 LAB — PREPARE RBC (CROSSMATCH)

## 2023-10-05 LAB — TROPONIN I (HIGH SENSITIVITY)
Troponin I (High Sensitivity): 14 ng/L (ref <18)
Troponin I (High Sensitivity): 11 ng/L (ref <18)

## 2023-10-05 LAB — BRAIN NATRIURETIC PEPTIDE: B Natriuretic Peptide: 142.7 pg/mL — ABNORMAL HIGH (ref 0.0–100.0)

## 2023-10-05 LAB — MRSA NEXT GEN BY PCR, NASAL: MRSA by PCR Next Gen: NOT DETECTED

## 2023-10-05 MED ORDER — ASPIRIN 81 MG PO TBEC
81.0000 mg | DELAYED_RELEASE_TABLET | Freq: Every day | ORAL | Status: DC
  Administered 2023-10-05 – 2023-10-10 (×6): 81 mg via ORAL
  Filled 2023-10-05 (×6): qty 1

## 2023-10-05 MED ORDER — REVEFENACIN 175 MCG/3ML IN SOLN
175.0000 ug | Freq: Two times a day (BID) | RESPIRATORY_TRACT | Status: DC
  Administered 2023-10-05 – 2023-10-06 (×3): 175 ug via RESPIRATORY_TRACT
  Filled 2023-10-05 (×5): qty 3

## 2023-10-05 MED ORDER — POTASSIUM CHLORIDE CRYS ER 20 MEQ PO TBCR
20.0000 meq | EXTENDED_RELEASE_TABLET | Freq: Every day | ORAL | Status: DC
  Administered 2023-10-05 – 2023-10-10 (×6): 20 meq via ORAL
  Filled 2023-10-05 (×6): qty 1

## 2023-10-05 MED ORDER — SODIUM CHLORIDE 0.9% IV SOLUTION: Freq: Once | INTRAVENOUS | Status: DC

## 2023-10-05 MED ORDER — LEVOTHYROXINE SODIUM 88 MCG PO TABS
88.0000 ug | ORAL_TABLET | Freq: Every day | ORAL | Status: DC
  Administered 2023-10-05 – 2023-10-10 (×6): 88 ug via ORAL
  Filled 2023-10-05 (×6): qty 1

## 2023-10-05 MED ORDER — COLCHICINE 0.6 MG PO TABS: 0.6000 mg | ORAL_TABLET | Freq: Every day | ORAL | Status: DC | PRN

## 2023-10-05 MED ORDER — LORATADINE 10 MG PO TABS
10.0000 mg | ORAL_TABLET | Freq: Every day | ORAL | Status: DC
  Administered 2023-10-05 – 2023-10-10 (×6): 10 mg via ORAL
  Filled 2023-10-05 (×6): qty 1

## 2023-10-05 MED ORDER — BUDESONIDE 0.5 MG/2ML IN SUSP
0.5000 mg | Freq: Two times a day (BID) | RESPIRATORY_TRACT | Status: DC
  Administered 2023-10-05 – 2023-10-07 (×4): 0.5 mg via RESPIRATORY_TRACT
  Filled 2023-10-05 (×5): qty 2

## 2023-10-05 MED ORDER — BUDESONIDE 0.5 MG/2ML IN SUSP: 0.5000 mg | Freq: Two times a day (BID) | RESPIRATORY_TRACT | Status: DC

## 2023-10-05 MED ORDER — ACETAMINOPHEN 325 MG PO TABS
650.0000 mg | ORAL_TABLET | Freq: Four times a day (QID) | ORAL | Status: DC | PRN
  Administered 2023-10-07: 650 mg via ORAL
  Filled 2023-10-05: qty 2

## 2023-10-05 MED ORDER — ATORVASTATIN CALCIUM 80 MG PO TABS
80.0000 mg | ORAL_TABLET | Freq: Every day | ORAL | Status: DC
  Administered 2023-10-05 – 2023-10-10 (×6): 80 mg via ORAL
  Filled 2023-10-05 (×6): qty 1

## 2023-10-05 MED ORDER — IOHEXOL 350 MG/ML SOLN
100.0000 mL | Freq: Once | INTRAVENOUS | Status: AC | PRN
  Administered 2023-10-05: 100 mL via INTRAVENOUS

## 2023-10-05 MED ORDER — DICYCLOMINE HCL 10 MG PO CAPS
10.0000 mg | ORAL_CAPSULE | Freq: Three times a day (TID) | ORAL | Status: DC
  Administered 2023-10-05 – 2023-10-10 (×15): 10 mg via ORAL
  Filled 2023-10-05 (×17): qty 1

## 2023-10-05 MED ORDER — SENNOSIDES-DOCUSATE SODIUM 8.6-50 MG PO TABS: 1.0000 | ORAL_TABLET | Freq: Every evening | ORAL | Status: DC | PRN

## 2023-10-05 MED ORDER — INSULIN ASPART 100 UNIT/ML IJ SOLN: 0.0000 [IU] | Freq: Three times a day (TID) | INTRAMUSCULAR | Status: DC

## 2023-10-05 MED ORDER — FUROSEMIDE 40 MG PO TABS
40.0000 mg | ORAL_TABLET | Freq: Every day | ORAL | Status: DC
  Administered 2023-10-05 – 2023-10-10 (×5): 40 mg via ORAL
  Filled 2023-10-05 (×5): qty 1

## 2023-10-05 MED ORDER — FLUTICASONE PROPIONATE 50 MCG/ACT NA SUSP
1.0000 | Freq: Every day | NASAL | Status: DC
  Administered 2023-10-05 – 2023-10-10 (×6): 1 via NASAL
  Filled 2023-10-05 (×2): qty 16

## 2023-10-05 MED ORDER — POTASSIUM CHLORIDE 20 MEQ PO PACK
20.0000 meq | PACK | Freq: Every day | ORAL | Status: DC
  Filled 2023-10-05: qty 1

## 2023-10-05 MED ORDER — KETOCONAZOLE 2 % EX SHAM
1.0000 | MEDICATED_SHAMPOO | Freq: Every day | CUTANEOUS | Status: DC
  Administered 2023-10-08 – 2023-10-09 (×2): 1 via TOPICAL
  Filled 2023-10-05 (×2): qty 120

## 2023-10-05 MED ORDER — ACETAMINOPHEN 650 MG RE SUPP: 650.0000 mg | Freq: Four times a day (QID) | RECTAL | Status: DC | PRN

## 2023-10-05 NOTE — ED Notes (Signed)
ED TO INPATIENT HANDOFF REPORT  ED Nurse Name and Phone #: [phone]  S Name/Age/Gender Jeremiah [name] 77 y.o. male Room/Bed: 006C/006C  Code Status   Code Status: Full Code  Home/SNF/Other Home Patient oriented to: self, place, time, and situation Is this baseline? Yes   Triage Complete: Triage complete  Chief Complaint GI bleed [K92.2]  Triage Note Pt returns for concerns for recurrent SOB and GI Bleed. Reports ongoing black tarry stools. No blood thinner, daily ASA. Remains on 4 L nasal cannula from home. Reports compliance with daily lasix. No cough/fevers. No weight gain.    Allergies Allergies  Allergen Reactions   Shellfish Allergy Shortness Of Breath, Nausea And Vomiting and Rash    Scallops     Fluzone [Influenza Virus Vaccine] Nausea And Vomiting, Palpitations and Rash    Level of Care/Admitting Diagnosis ED Disposition     ED Disposition  Admit   Condition  --   Comment  Hospital [facility] [[id number]]  Level of Care: Telemetry Cardiac [103]  May admit patient to [facility] or [facility] if equivalent level of care is available:: No  Covid Evaluation: Asymptomatic - no recent exposure (last 10 days) testing not required  Diagnosis: GI bleed [454098]  Admitting Physician: Steffanie Rainwater [[id number]]  Attending Physician: Steffanie Rainwater [[id number]]  Certification:: I certify this patient will need inpatient services for at least 2 midnights  Expected Medical Readiness: 10/08/2023          B Medical/Surgery History Past Medical History:  Diagnosis Date   A-fib (HCC)    Acute on chronic systolic (congestive) heart failure (HCC) 06/10/2017   Acute pulmonary edema (HCC)    Anemia    Aortic valve sclerosis 12/2018   Noted on ECHO   Arthritis    CAD (coronary artery disease)    a. 2006: CABG in 2006 with LIMA-LAD, SVG-OM1, and SVG-RPDA   Cardiomegaly 12/2018   Stable, noted on CXR   Carpal tunnel syndrome    Right    Chronic pain 03/21/2016   Colon cancer (HCC) 2006   COPD (chronic obstructive pulmonary disease) (HCC)    Diabetes mellitus without complication (HCC)    DVT (deep venous thrombosis) (HCC)    Right   Essential hypertension 03/21/2016   GERD (gastroesophageal reflux disease)    History of blood transfusion    History of Clostridioides difficile infection    History of prostate cancer 03/21/2016   History of PSVT (paroxysmal supraventricular tachycardia)    HLD (hyperlipidemia)    HTN (hypertension)    Hx of CABG 2006   Hypercholesteremia 03/21/2016   Hypothyroidism    LAE (left atrial enlargement) 12/2018   Severe, Noted on ECHO   LVH (left ventricular hypertrophy) 12/2018   Mild, Noted on ECHO   Medication management 03/21/2016   Mitral annular calcification 12/2018   with mild MS, Noted on ECHO   Morbid obesity (HCC) 03/21/2016   Myocardial infarct (HCC)    OSA (obstructive sleep apnea) 03/21/2016   uses oxygen at night time   Pain in right ankle and joints of right foot 03/21/2016   Paronychia 03/21/2016   Phimosis    Pneumonia    Primary insomnia 03/21/2016   Prostate cancer (HCC) 2008   Pulmonary hypertension (HCC) 12/2018   Moderate, Noted on ECHO   Tobacco dependence 03/21/2016   Tricuspid regurgitation 12/2018   Mild, Noted on ECHO   Past Surgical History:  Procedure Laterality Date   ANKLE  SURGERY Right 12/2013   CARDIAC CATHETERIZATION N/A 05/08/2016   Procedure: Left Heart Cath and Cors/Grafts Angiography;  Surgeon: Lyn Records, MD;  [facility] INVAS[facility] CV LAB;  Service: Cardiovascular;  Laterality: N/A;   CIRCUMCISION N/A 10/07/2019   Procedure: CIRCUMCISION ADULT;  Surgeon: Crist Fat, MD;  Location: [facility] ORS;  Service: Urology;  Laterality: N/A;   CORONARY ARTERY BYPASS GRAFT  2006   x3   ELECTROPHYSIOLOGIC STUDY N/A 08/24/2016   Procedure: SVT Ablation;  Surgeon: Will Jorja Loa, MD;  Location: [facility] INVASIVE CV LAB;  Service: Cardiovascular;   Laterality: N/A;   PACEMAKER IMPLANT N/A 08/23/2023   Procedure: PACEMAKER IMPLANT;  Surgeon: Regan Lemming, MD;  Location: [facility] INVASIVE CV LAB;  Service: Cardiovascular;  Laterality: N/A;   PROSTATE SURGERY  2008   PTCA     UMBILICAL HERNIA REPAIR N/A 05/26/2022   Procedure: PRIMARY REPAIR OF STRANGULATED UMBILICAL HERNIA WITH PARTIAL OMENTECTOMY;  Surgeon: Almond Lint, MD;  Location: [facility] OR;  Service: General;  Laterality: N/A;     A IV Location/Drains/Wounds Patient Lines/Drains/Airways Status     Active Line/Drains/Airways     Name Placement date Placement time Site Days   Peripheral IV 10/05/23 20 G Left Wrist 10/05/23  --  Wrist  less than 1   Peripheral IV 10/05/23 20 G Right Hand 10/05/23  0916  Hand  less than 1            Intake/Output Last 24 hours No intake or output data in the 24 hours ending 10/05/23 1031  Labs/Imaging Results for orders placed or performed during the hospital encounter of 10/05/23 (from the past 48 hour(s))  Type and screen [facility]     Status: None (Preliminary result)   Collection Time: 10/05/23  6:20 AM  Result Value Ref Range   ABO/RH(D) O POS    Antibody Screen POS    Sample Expiration 10/08/2023,2359    Antibody Identification ANTI K    Unit Number [id number]    Blood Component Type RBC LR PHER1    Unit division 00    Status of Unit ISSUED    Donor AG Type NEGATIVE FOR KELL ANTIGEN    Transfusion Status OK TO TRANSFUSE    Crossmatch Result COMPATIBLE    Unit Number [id number]    Blood Component Type RBC LR PHER1    Unit division 00    Status of Unit ALLOCATED    Donor AG Type NEGATIVE FOR KELL ANTIGEN    Transfusion Status OK TO TRANSFUSE    Crossmatch Result COMPATIBLE   Comprehensive metabolic panel     Status: Abnormal   Collection Time: 10/05/23  6:36 AM  Result Value Ref Range   Sodium 134 (L) 135 - 145 mmol/L   Potassium 3.6 3.5 - 5.1 mmol/L   Chloride 99 98 - 111 mmol/L   CO2 27 22 -  32 mmol/L   Glucose, Bld 190 (H) 70 - 99 mg/dL    Comment: Glucose reference range applies only to samples taken after fasting for at least 8 hours.   BUN 32 (H) 8 - 23 mg/dL   Creatinine, Ser 3.08 0.61 - 1.24 mg/dL   Calcium 8.8 (L) 8.9 - 10.3 mg/dL   Total Protein 7.0 6.5 - 8.1 g/dL   Albumin 3.8 3.5 - 5.0 g/dL   AST 17 15 - 41 U/L   ALT 17 0 - 44 U/L   Alkaline Phosphatase 60 38 - 126 U/L  Total Bilirubin 0.7 0.3 - 1.2 mg/dL   GFR, Estimated >09 >81 mL/min    Comment: (NOTE) Calculated using the CKD-EPI Creatinine Equation (2021)    Anion gap 8 5 - 15    Comment: Performed at [facility], [address], [place], [place] [address]  CBC     Status: Abnormal   Collection Time: 10/05/23  6:36 AM  Result Value Ref Range   WBC 5.3 4.0 - 10.5 K/uL   RBC 2.14 (L) 4.22 - 5.81 MIL/uL   Hemoglobin 6.6 (LL) 13.0 - 17.0 g/dL    Comment: REPEATED TO VERIFY THIS CRITICAL RESULT HAS VERIFIED AND BEEN CALLED TO S.BLACKWELL RN BY CHARLES EDENS ON 11 02 2024 AT 0716, AND HAS BEEN READ BACK.     HCT 20.9 (L) 39.0 - 52.0 %   MCV 97.7 80.0 - 100.0 fL   MCH 30.8 26.0 - 34.0 pg   MCHC 31.6 30.0 - 36.0 g/dL   RDW 82.9 (H) 56.2 - 13.0 %   Platelets 213 150 - 400 K/uL   nRBC 0.4 (H) 0.0 - 0.2 %    Comment: Performed at [facility], [address], [place], [place] [address]  Prepare RBC (crossmatch)     Status: None   Collection Time: 10/05/23  7:22 AM  Result Value Ref Range   Order Confirmation      ORDER PROCESSED BY BLOOD BANK Performed at [facility], [address], [place], [place] [address]   Brain natriuretic peptide     Status: Abnormal   Collection Time: 10/05/23  8:07 AM  Result Value Ref Range   B Natriuretic Peptide 142.7 (H) 0.0 - 100.0 pg/mL    Comment: Performed at [facility], [address] [place], [place] [address]  TSH     Status: None   Collection Time: 10/05/23  8:07 AM  Result Value Ref Range   TSH 4.272 0.350 - 4.500 uIU/mL     Comment: Performed by a 3rd Generation assay with a functional sensitivity of <=0.01 uIU/mL. Performed at [facility], [address], [place], [place] [address]   Troponin I (High Sensitivity)     Status: None   Collection Time: 10/05/23  8:07 AM  Result Value Ref Range   Troponin I (High Sensitivity) 14 <18 ng/L    Comment: (NOTE) Elevated high sensitivity troponin I (hsTnI) values and significant  changes across serial measurements may suggest ACS but many other  chronic and acute conditions are known to elevate hsTnI results.  Refer to the "Links" section for chest pain algorithms and additional  guidance. Performed at [facility], [address], [place], [place] [address]    DG Chest 2 View  Result Date: 10/05/2023 CLINICAL DATA:  Shortness of breath. EXAM: CHEST - 2 VIEW COMPARISON:  10/02/2023 FINDINGS: The lungs are clear without focal pneumonia, edema, pneumothorax or pleural effusion. Interstitial markings are diffusely coarsened with chronic features. Cardiopericardial silhouette is at upper limits of normal for size. Left permanent pacemaker again noted. No acute bony abnormality. IMPRESSION: Chronic interstitial coarsening without acute cardiopulmonary findings. Electronically Signed   By: Kennith Center M.D.   On: 10/05/2023 07:08    Pending Labs Unresulted Labs (From admission, onward)    None       Vitals/Pain Today's Vitals   10/05/23 0930 10/05/23 0935 10/05/23 0938 10/05/23 0945  BP: (!) 102/45 (!) 113/50 (!) 113/50 (!) 166/64  Pulse:  73 72  69  Resp: 14 14  14   Temp:  97.7 F (36.5 C) 97.7 F (36.5 C)   TempSrc:  Oral Oral   SpO2: 100% 100%  100%  Weight:      Height:      PainSc:        Isolation Precautions No active isolations  Medications Medications  0.9 %  sodium chloride infusion (Manually program via Guardrails IV Fluids) (has no administration in time range)  acetaminophen (TYLENOL) tablet 650 mg (has no administration in  time range)    Or  acetaminophen (TYLENOL) suppository 650 mg (has no administration in time range)  senna-docusate (Senokot-S) tablet 1 tablet (has no administration in time range)    Mobility walks     Focused Assessments    R Recommendations: See Admitting Provider Note  Report given to:   Additional Notes: hgb 6.6, first blood transfusion infusing. Positive hemoccult at docs yesterday.

## 2023-10-05 NOTE — ED Provider Notes (Signed)
Jeremiah [name] EMERGENCY DEPARTMENT AT [facility] Provider Note   CSN: [id number] Arrival date & time: 10/05/23  4098     History  Chief Complaint  Patient presents with   Shortness of Breath   Rectal Bleeding    Jeremiah [name] is a 77 y.o. male.  The history is provided by the patient and medical records. No language interpreter was used.  Shortness of Breath Severity:  Moderate Onset quality:  Gradual Duration:  1 week Timing:  Intermittent Progression:  Waxing and waning Chronicity:  Recurrent Context: not URI   Relieved by:  Nothing Worsened by:  Exertion Ineffective treatments:  None tried Associated symptoms: no abdominal pain, no chest pain, no cough, no diaphoresis, no fever, no headaches, no neck pain, no rash, no vomiting and no wheezing   Rectal Bleeding Associated symptoms: light-headedness   Associated symptoms: no abdominal pain, no fever and no vomiting        Home Medications Prior to Admission medications   Medication Sig Start Date End Date Taking? Authorizing Provider  aspirin EC 81 MG tablet Take 1 tablet (81 mg total) by mouth daily. Swallow whole. 04/17/23   Orbie Pyo, MD  budesonide (PULMICORT) 0.5 MG/2ML nebulizer solution Take 2 mLs (0.5 mg total) by nebulization 2 (two) times daily. 05/13/23   Martina Sinner, MD  colchicine 0.6 MG tablet Take 0.6 mg by mouth daily as needed (Gout).    [provider]  dexlansoprazole (DEXILANT) 60 MG capsule Take 60 mg by mouth daily. 01/29/23   [provider]  dicyclomine (BENTYL) 10 MG capsule Take 10 mg by mouth daily as needed for spasms. 02/19/23   [provider]  ezetimibe (ZETIA) 10 MG tablet TAKE 1 TABLET(10 MG) BY MOUTH DAILY 08/01/23   Hilty, Lisette Abu, MD  FEROSUL 325 (65 Fe) MG tablet TAKE 1 TABLET BY MOUTH DAILY WITH BREAKFAST 07/08/23   Hilty, Lisette Abu, MD  fluticasone (FLONASE) 50 MCG/ACT nasal spray Place 1 spray into both nostrils daily. 06/26/23   Hilty,  Lisette Abu, MD  furosemide (LASIX) 40 MG tablet Take 2 tablets (80 mg total) by mouth daily. 06/11/23   Zannie Cove, MD  glimepiride (AMARYL) 4 MG tablet Take 4 mg by mouth daily.    [provider]  ketoconazole (NIZORAL) 2 % shampoo Apply 1 Application topically daily. 07/11/23   [provider]  levothyroxine (SYNTHROID, LEVOTHROID) 88 MCG tablet Take 88 mcg by mouth daily before breakfast.    [provider]  Potassium Chloride ER 20 MEQ TBCR Take 1 tablet by mouth daily. 07/11/23   [provider]  revefenacin (YUPELRI) 175 MCG/3ML nebulizer solution Take 175 mcg by nebulization in the morning and at bedtime.    [provider]  spironolactone (ALDACTONE) 25 MG tablet Take 1 tablet (25 mg total) by mouth daily. 06/12/23   Zannie Cove, MD      Allergies    Shellfish allergy and Fluzone [influenza virus vaccine]    Review of Systems   Review of Systems  Constitutional:  Positive for fatigue. Negative for chills, diaphoresis and fever.  HENT:  Negative for congestion.   Respiratory:  Positive for shortness of breath. Negative for cough, chest tightness, wheezing and stridor.   Cardiovascular:  Negative for chest pain, palpitations and leg swelling.  Gastrointestinal:  Positive for blood in stool and hematochezia. Negative for abdominal pain, diarrhea, nausea and vomiting.  Genitourinary:  Negative for frequency.  Musculoskeletal:  Negative for back  pain and neck pain.  Skin:  Negative for rash.  Neurological:  Positive for light-headedness. Negative for syncope and headaches.  Psychiatric/Behavioral:  Negative for agitation and confusion.   All other systems reviewed and are negative.   Physical Exam Updated Vital Signs BP 103/61   Pulse 92   Temp 97.8 F (36.6 C) (Oral)   Resp 20   Ht 5\' 7"  (1.702 m)   Wt 92.5 kg   SpO2 100%   BMI 31.95 kg/m  Physical Exam Vitals and nursing note reviewed.  Constitutional:      General: He is  not in acute distress.    Appearance: He is well-developed. He is not ill-appearing, toxic-appearing or diaphoretic.  HENT:     Head: Normocephalic and atraumatic.  Eyes:     Conjunctiva/sclera: Conjunctivae normal.     Pupils: Pupils are equal, round, and reactive to light.  Cardiovascular:     Rate and Rhythm: Normal rate and regular rhythm.     Heart sounds: No murmur heard. Pulmonary:     Effort: Pulmonary effort is normal. No tachypnea or respiratory distress.     Breath sounds: Rales present. No wheezing or rhonchi.  Chest:     Chest wall: No tenderness.  Abdominal:     Palpations: Abdomen is soft.     Tenderness: There is no abdominal tenderness.  Musculoskeletal:        General: No swelling.     Cervical back: Neck supple.     Right lower leg: No edema.     Left lower leg: No edema.  Skin:    General: Skin is warm and dry.     Capillary Refill: Capillary refill takes less than 2 seconds.     Coloration: Skin is pale.     Findings: No erythema.  Neurological:     General: No focal deficit present.     Mental Status: He is alert.  Psychiatric:        Mood and Affect: Mood normal.     ED Results / Procedures / Treatments   Labs (all labs ordered are listed, but only abnormal results are displayed) Labs Reviewed  COMPREHENSIVE METABOLIC PANEL - Abnormal; Notable for the following components:      Result Value   Sodium 134 (*)    Glucose, Bld 190 (*)    BUN 32 (*)    Calcium 8.8 (*)    All other components within normal limits  CBC - Abnormal; Notable for the following components:   RBC 2.14 (*)    Hemoglobin 6.6 (*)    HCT 20.9 (*)    RDW 18.6 (*)    nRBC 0.4 (*)    All other components within normal limits  BRAIN NATRIURETIC PEPTIDE - Abnormal; Notable for the following components:   B Natriuretic Peptide 142.7 (*)    All other components within normal limits  TSH  POC OCCULT BLOOD, ED  TYPE AND SCREEN  PREPARE RBC (CROSSMATCH)  TROPONIN I (HIGH  SENSITIVITY)  TROPONIN I (HIGH SENSITIVITY)    EKG EKG Interpretation Date/Time:  Saturday October 05 2023 06:27:19 EDT Ventricular Rate:  86 PR Interval:  184 QRS Duration:  98 QT Interval:  408 QTC Calculation: 488 R Axis:   46  Text Interpretation: Normal sinus rhythm Cannot rule out Inferior infarct , age undetermined Abnormal ECG When compared with ECG of 23-Aug-2023 06:44:28 No acute changes Confirmed by Gilda Crease [phone]) on 10/05/2023 6:31:09 AM  Radiology DG Chest 2  View  Result Date: 10/05/2023 CLINICAL DATA:  Shortness of breath. EXAM: CHEST - 2 VIEW COMPARISON:  10/02/2023 FINDINGS: The lungs are clear without focal pneumonia, edema, pneumothorax or pleural effusion. Interstitial markings are diffusely coarsened with chronic features. Cardiopericardial silhouette is at upper limits of normal for size. Left permanent pacemaker again noted. No acute bony abnormality. IMPRESSION: Chronic interstitial coarsening without acute cardiopulmonary findings. Electronically Signed   By: Kennith Center M.D.   On: 10/05/2023 07:08    Procedures Procedures    CRITICAL CARE Performed by: Canary Brim Matyas Baisley Total critical care time: 35 minutes Critical care time was exclusive of separately billable procedures and treating other patients. Critical care was necessary to treat or prevent imminent or life-threatening deterioration. Critical care was time spent personally by me on the following activities: development of treatment plan with patient and/or surrogate as well as nursing, discussions with consultants, evaluation of patient's response to treatment, examination of patient, obtaining history from patient or surrogate, ordering and performing treatments and interventions, ordering and review of laboratory studies, ordering and review of radiographic studies, pulse oximetry and re-evaluation of patient's condition.  Medications Ordered in ED Medications  0.9 %  sodium  chloride infusion (Manually program via Guardrails IV Fluids) (has no administration in time range)    ED Course/ Medical Decision Making/ A&P                                 Medical Decision Making Amount and/or Complexity of Data Reviewed Labs: ordered. Radiology: ordered.  Risk Prescription drug management.    Jeremiah [name] is a 77 y.o. male with a past medical history significant for CAD status post CABG, hypertension, hyperlipidemia, COPD on 4 L home oxygen as needed, previous ischemic colitis, prior atrial fibrillation, complete heart block with pacemaker placed several months ago, pulmonary hypertension, diabetes, and previous GI bleed who presents at the direction of his PCP with concern for recurrent GI bleed.  According to patient, for the last week or so he is a dark black stools and is getting fatigued and lightheaded.  He is getting more exertional shortness of breath and cannot lay down like he normally does.  He has been increasing his oxygen at home up to 8 L intermittently and he normally does not need oxygen all the time.  He cannot walk across the room without getting winded but he denies any chest pain or palpitations.  Denies any nausea, vomiting, or urinary changes.  Reports he is increased his Lasix recently and his edema seems to be improved.  He saw his doctor yesterday and was told his hemoglobin had dropped into the sevens and he need to come to the emergency department.  His fecal occult was positive yesterday he was told.  On exam, lungs had some faint rales.  Chest nontender, abdomen nontender.  No edema in legs.  Good pulses.  Patient is pale and his palate and sclera.  Patient otherwise resting on 4 L.  Blood pressure around 100 systolic.  As he had a fecal occult positive yesterday will not repeat today.  Patient otherwise resting.  Clinically unconcerned about symptomatic anemia primarily.  Patient will have screening labs but will also get chest x-ray, TSH  for the fatigue, BNP with the exertional shortness of breath and fatigue, and other labs.  Will get chest x-ray.  Hemoglobin is now 6.6 and decreasing.  Will order blood and consult GI  as he sees outpatient GI teams nonaffiliated with [facility].  He will need admission for management of symptomatic anemia once other workup is completed.  9:27 AM Workup continues to return.  Troponin negative.  Patient will get 2 units of blood given a hemoglobin of 6.6 and downtrending with symptomatic anemia.  BNP is 142 and actually improved from prior.  TSH normal.  Metabolic panel did not show AKI or significant LFT elevation.  Patient still waiting on the chest x-ray results but we will go ahead and call for admission as I do not see evidence of large pneumothorax or other abnormality that would need intervention at this time.         Final Clinical Impression(s) / ED Diagnoses Final diagnoses:  Acute GI bleeding  Exertional shortness of breath  Symptomatic anemia      Clinical Impression: 1. Acute GI bleeding   2. Exertional shortness of breath   3. Symptomatic anemia     Disposition: Admit  This note was prepared with assistance of Dragon voice recognition software. Occasional wrong-word or sound-a-like substitutions may have occurred due to the inherent limitations of voice recognition software.     Elo Marmolejos, Canary Brim, MD 10/05/23 [phone]

## 2023-10-05 NOTE — Plan of Care (Signed)
  Problem: Activity: Goal: Risk for activity intolerance will decrease Outcome: Progressing   Problem: Coping: Goal: Level of anxiety will decrease Outcome: Progressing   Problem: Elimination: Goal: Will not experience complications related to bowel motility Outcome: Progressing Goal: Will not experience complications related to urinary retention Outcome: Progressing   

## 2023-10-05 NOTE — H&P (Signed)
History and Physical    Patient: Jeremiah [name] UEA:[id number] DOB: 19-Jul-1946 DOA: 10/05/2023 DOS: the patient was seen and examined on 10/05/2023 PCP: [name], MD  Patient coming from: Home  Chief Complaint:  Chief Complaint  Patient presents with   Shortness of Breath   Rectal Bleeding   HPI: [name] is a 77 y.o. male with medical history significant of HTN, HLD, T2DM, CAD s/p CABG, HF, COPD on nighttime 4 L, Afib on Eliquis, ischemic colitis s/p SMA stenting on DAPT and recurrent GI bleeds who presented to ED for evaluation of black tarry stools and shortness of breath.  Patient was in his usual state of health until 2 weeks ago he noticed worsening shortness of breath and dyspnea on exertion. Over the last week, shortness of breath has worsened requiring increased O2 need up to 8 L during the day. He also reports multiple black tarry stools over the last 2 weeks. He endorses generalized weakness, lightheadedness and fatigue but denies any chest pain, headaches, leg swelling, syncope, abdominal pain, dysuria, hematuria, hemoptysis, palpitations or vision changes.  Orts that his hemoglobin was 12.53 weeks ago was down to 7.4 at his PCP office yesterday.  His PCP perform a rectal exam at the office and stool was positive for him.  Patient was advised to come to the ED due to possible GI bleed.  ED course: Hemodynamically stable with soft blood pressure, SBP in the 100s. Significant labs include hemoglobin 6.6, BNP 142, TSH 4.2, troponins 14-11, normal kidney function and no electrolyte abnormalities. CXR without acute cardiopulmonary disease.  Patient was started on 2 units PRBC.  GI was consulted for evaluation and hospitalist was consulted for admission.   Review of Systems: As mentioned in the history of present illness. All other systems reviewed and are negative. Past Medical History:  Diagnosis Date   A-fib [facility])    Acute on chronic systolic (congestive) heart failure (HCC)  06/10/2017   Acute pulmonary edema (HCC)    Anemia    Aortic valve sclerosis 12/2018   Noted on ECHO   Arthritis    CAD (coronary artery disease)    a. 2006: CABG in 2006 with LIMA-LAD, SVG-OM1, and SVG-RPDA   Cardiomegaly 12/2018   Stable, noted on CXR   Carpal tunnel syndrome    Right   Chronic pain 03/21/2016   Colon cancer (HCC) 2006   COPD (chronic obstructive pulmonary disease) (HCC)    Diabetes mellitus without complication (HCC)    DVT (deep venous thrombosis) (HCC)    Right   Essential hypertension 03/21/2016   GERD (gastroesophageal reflux disease)    History of blood transfusion    History of Clostridioides difficile infection    History of prostate cancer 03/21/2016   History of PSVT (paroxysmal supraventricular tachycardia)    HLD (hyperlipidemia)    HTN (hypertension)    Hx of CABG 2006   Hypercholesteremia 03/21/2016   Hypothyroidism    LAE (left atrial enlargement) 12/2018   Severe, Noted on ECHO   LVH (left ventricular hypertrophy) 12/2018   Mild, Noted on ECHO   Medication management 03/21/2016   Mitral annular calcification 12/2018   with mild MS, Noted on ECHO   Morbid obesity (HCC) 03/21/2016   Myocardial infarct (HCC)    OSA (obstructive sleep apnea) 03/21/2016   uses oxygen at night time   Pain in right ankle and joints of right foot 03/21/2016   Paronychia 03/21/2016   Phimosis    Pneumonia  Primary insomnia 03/21/2016   Prostate cancer (HCC) 2008   Pulmonary hypertension (HCC) 12/2018   Moderate, Noted on ECHO   Tobacco dependence 03/21/2016   Tricuspid regurgitation 12/2018   Mild, Noted on ECHO   Past Surgical History:  Procedure Laterality Date   ANKLE SURGERY Right 12/2013   CARDIAC CATHETERIZATION N/A 05/08/2016   Procedure: Left Heart Cath and Cors/Grafts Angiography;  Surgeon: Lyn Records, MD;  Location: [facility] INVASIVE CV LAB;  Service: Cardiovascular;  Laterality: N/A;   CIRCUMCISION N/A 10/07/2019   Procedure: CIRCUMCISION  ADULT;  Surgeon: Crist Fat, MD;  Location: [facility] ORS;  Service: Urology;  Laterality: N/A;   CORONARY ARTERY BYPASS GRAFT  2006   x3   ELECTROPHYSIOLOGIC STUDY N/A 08/24/2016   Procedure: SVT Ablation;  Surgeon: Will Jorja Loa, MD;  Location: [facility] INVASIVE CV LAB;  Service: Cardiovascular;  Laterality: N/A;   PACEMAKER IMPLANT N/A 08/23/2023   Procedure: PACEMAKER IMPLANT;  Surgeon: Regan Lemming, MD;  Location: [facility] INVASIVE CV LAB;  Service: Cardiovascular;  Laterality: N/A;   PROSTATE SURGERY  2008   PTCA     UMBILICAL HERNIA REPAIR N/A 05/26/2022   Procedure: PRIMARY REPAIR OF STRANGULATED UMBILICAL HERNIA WITH PARTIAL OMENTECTOMY;  Surgeon: Almond Lint, MD;  Location: [facility] OR;  Service: General;  Laterality: N/A;   Social History:  reports that he quit smoking about 6 years ago. His smoking use included cigarettes. He started smoking about 66 years ago. He has a 45 pack-year smoking history. He has never used smokeless tobacco. He reports that he does not currently use alcohol. He reports that he does not use drugs.  Allergies  Allergen Reactions   Shellfish Allergy Shortness Of Breath, Nausea And Vomiting and Rash    Scallops     Fluzone [Influenza Virus Vaccine] Nausea And Vomiting, Palpitations and Rash    Family History  Problem Relation Age of Onset   Dementia Mother    Diabetes Sister     Prior to Admission medications   Medication Sig Start Date End Date Taking? Authorizing Provider  acetaminophen (TYLENOL) 650 MG CR tablet Take 1,300 mg by mouth every 8 (eight) hours as needed for pain.   Yes [provider]  aspirin EC 81 MG tablet Take 1 tablet (81 mg total) by mouth daily. Swallow whole. 04/17/23  Yes Orbie Pyo, MD  atorvastatin (LIPITOR) 80 MG tablet Take 80 mg by mouth daily.   Yes [provider]  budesonide (PULMICORT) 0.5 MG/2ML nebulizer solution Take 2 mLs (0.5 mg total) by nebulization 2 (two) times daily. 05/13/23  Yes  Martina Sinner, MD  cetirizine (ZYRTEC) 10 MG tablet Take 10 mg by mouth daily.   Yes [provider]  colchicine 0.6 MG tablet Take 0.6 mg by mouth daily as needed (Gout).   Yes [provider]  dicyclomine (BENTYL) 10 MG capsule Take 10 mg by mouth 3 (three) times daily before meals. 30 minutes before eating 02/19/23  Yes [provider]  FEROSUL 325 (65 Fe) MG tablet TAKE 1 TABLET BY MOUTH DAILY WITH BREAKFAST 07/08/23  Yes Hilty, Lisette Abu, MD  fluticasone (FLONASE) 50 MCG/ACT nasal spray Place 1 spray into both nostrils daily. 06/26/23  Yes Hilty, Lisette Abu, MD  furosemide (LASIX) 40 MG tablet Take 2 tablets (80 mg total) by mouth daily. Patient taking differently: Take 40 mg by mouth daily. 06/11/23  Yes Zannie Cove, MD  ketoconazole (NIZORAL) 2 % shampoo Apply 1 Application topically daily.  07/11/23  Yes [provider]  levothyroxine (SYNTHROID, LEVOTHROID) 88 MCG tablet Take 88 mcg by mouth daily before breakfast. Take on an empty stomach   Yes [provider]  Potassium Chloride ER 20 MEQ TBCR Take 1 tablet by mouth daily. 07/11/23  Yes [provider]  revefenacin (YUPELRI) 175 MCG/3ML nebulizer solution Take 175 mcg by nebulization in the morning and at bedtime.   Yes [provider]  spironolactone (ALDACTONE) 25 MG tablet Take 1 tablet (25 mg total) by mouth daily. 06/12/23  Yes Zannie Cove, MD    Physical Exam: Vitals:   10/05/23 0935 10/05/23 0938 10/05/23 0945 10/05/23 1116  BP: (!) 113/50 (!) 113/50 (!) 166/64 (!) 110/98  Pulse: 72  69   Resp: 14  14   Temp: 97.7 F (36.5 C) 97.7 F (36.5 C)  97.6 F (36.4 C)  TempSrc: Oral Oral  Oral  SpO2: 100%  100% 100%  Weight:      Height:      General: Pleasant, well-appearing well-appearing laying in bed. No acute distress. HEENT: Normocephalic. Atraumatic.  Pale conjunctiva CV: RRR. No murmurs, rubs, or gallops. No LE edema Pulmonary: Lungs CTAB. Normal effort.  No wheezing or rales. Decreased breath sounds at the bases. Abdominal: Soft, nontender, nondistended. Normal bowel sounds. Extremities: Faint DP and PT pulses.  Normal range of motion. Skin: Warm and dry. No obvious rash or lesions. Neuro: A&Ox3. Moves all extremities. Normal sensation. No focal deficit. Psych: Normal mood and affect  Data Reviewed:  Normal kidney function Hemoglobin down to 6.6 from 12.5 3 weeks ago CXR without any acute cardiopulmonary findings. EKG with paced rhythm with HR of 86  Assessment and Plan: Drae Mitzel is a 77 y.o. male with medical history significant of HTN, HLD, T2DM, CAD s/p CABG, HF, COPD on nighttime 4 L, Afib on Eliquis, ischemic colitis s/p SMA stenting on DAPT and recurrent GI bleeds who presented to ED for evaluation of black tarry stools and shortness of breath and admitted for GI bleed  # GI bleed # Symptomatic anemia # Hx iron deficiency anemia Patient with a history of recurrent GI bleeds here for evaluation of ongoing black tarry stool with associated fatigue and lightheadedness. Hemoglobin down to 6.3, from 12.5 3 weeks ago.  Patient has extensive history of GI bleeds with multiple hospitalizations at [facility] and [place]. He has had extensive workup with EGD, colonoscopy and capsule endoscopy. He has a history of a diverticular bleed as well as SMA bleed. He is receiving 2 units PRBC.  He remains hemodynamically stable. GI has been consulted for possible endoscopic evaluation. Will get imaging to look for recurrent SMA bleed as patient previously refused IR embolization at [facility] earlier this year.  -CTA GI bleed study -GI consulted, appreciate recs -F/u posttransfusion H&H -Trend CBC, transfuse to keep hgb >7  # A-fib on Eliquis History of complete heart block status post pacemaker implanted about 2 months ago.  Not on anticoagulation due to recurrent bleeds. EKG shows ventricularly paced rhythm. He reports ongoing fatigue and  lightheadedness but denies any palpitations. EKG today with sinus rhythm. -Telemetry -Trend and replete electrolytes  # Diastolic heart failure Last TTE on 06/2023 showed EF 55 to 60%, moderate asymmetric LVH, G1 DD, severely dilated LA, and moderate mitral stenosis. Patient with progressive dyspnea on exertion over the last week however he is euvolemic on exam. -Resume home Lasix 40 mg daily -KCl 20 mEq daily -Strict I&O's, daily weights  #  Chronic hypoxic respiratory failure # COPD Patient remains on 4 L at night and occasionally 2 L during the day with activity.  He reports increased O2 needs at home the last week likely secondary to his anemia.  No wheezing or rales on exam. -Continue on supplemental O2 -Resume home DuoNebs and Flonase  # CAD s/p CABG # HLD -ASA 81 mg daily -Atorvastatin 80 mg daily  # HTN BP has been soft during admission. -IVF to maintain MAP >65 -Hold BP meds  # T2DM A1c down to 5.9% 1 month ago. -SSI with meals, CBG monitoring  # Hypothyroidism TSH 4.2 -Synthroid 88 mcg daily  Peripheral vascular disease Patient reports ongoing claudication that is being worked out by vascular surgery in the outpatient.  He has faint pedal pulses but denies any leg pain at rest.   Advance Care Planning:   Code Status: Full Code   Consults: Gastroenterology  Family Communication: Discussed admission with spouse at bedside  Severity of Illness: The appropriate patient status for this patient is INPATIENT. Inpatient status is judged to be reasonable and necessary in order to provide the required intensity of service to ensure the patient's safety. The patient's presenting symptoms, physical exam findings, and initial radiographic and laboratory data in the context of their chronic comorbidities is felt to place them at high risk for further clinical deterioration. Furthermore, it is not anticipated that the patient will be medically stable for discharge from the  hospital within 2 midnights of admission.   * I certify that at the point of admission it is my clinical judgment that the patient will require inpatient hospital care spanning beyond 2 midnights from the point of admission due to high intensity of service, high risk for further deterioration and high frequency of surveillance required.*  Author: Steffanie Rainwater, MD 10/05/2023 11:24 AM  For on call review [email or web address].

## 2023-10-05 NOTE — ED Triage Notes (Signed)
Pt returns for concerns for recurrent SOB and GI Bleed. Reports ongoing black tarry stools. No blood thinner, daily ASA. Remains on 4 L nasal cannula from home. Reports compliance with daily lasix. No cough/fevers. No weight gain.

## 2023-10-05 NOTE — ED Notes (Signed)
Attempted to call 2C twice now with no luck in anyone picking up. Notified charge nurse.

## 2023-10-06 DIAGNOSIS — K922 Gastrointestinal hemorrhage, unspecified: Secondary | ICD-10-CM | POA: Diagnosis not present

## 2023-10-06 DIAGNOSIS — D509 Iron deficiency anemia, unspecified: Secondary | ICD-10-CM | POA: Diagnosis not present

## 2023-10-06 DIAGNOSIS — R195 Other fecal abnormalities: Secondary | ICD-10-CM | POA: Diagnosis not present

## 2023-10-06 DIAGNOSIS — K921 Melena: Secondary | ICD-10-CM | POA: Diagnosis not present

## 2023-10-06 LAB — GLUCOSE, CAPILLARY
Glucose-Capillary: 156 mg/dL — ABNORMAL HIGH (ref 70–99)
Glucose-Capillary: 154 mg/dL — ABNORMAL HIGH (ref 70–99)
Glucose-Capillary: 161 mg/dL — ABNORMAL HIGH (ref 70–99)
Glucose-Capillary: 172 mg/dL — ABNORMAL HIGH (ref 70–99)

## 2023-10-06 LAB — CBC WITH DIFFERENTIAL/PLATELET
Abs Immature Granulocytes: 0.1 10*3/uL — ABNORMAL HIGH (ref 0.00–0.07)
Basophils Absolute: 0 10*3/uL (ref 0.0–0.1)
Basophils Relative: 1 %
Eosinophils Absolute: 0.2 10*3/uL (ref 0.0–0.5)
Eosinophils Relative: 3 %
HCT: 23.9 % — ABNORMAL LOW (ref 39.0–52.0)
Hemoglobin: 7.9 g/dL — ABNORMAL LOW (ref 13.0–17.0)
Immature Granulocytes: 2 %
Lymphocytes Relative: 19 %
Lymphs Abs: 1 10*3/uL (ref 0.7–4.0)
MCH: 31.3 pg (ref 26.0–34.0)
MCHC: 33.1 g/dL (ref 30.0–36.0)
MCV: 94.8 fL (ref 80.0–100.0)
Monocytes Absolute: 0.7 10*3/uL (ref 0.1–1.0)
Monocytes Relative: 13 %
Neutro Abs: 3.2 10*3/uL (ref 1.7–7.7)
Neutrophils Relative %: 62 %
Platelets: 177 10*3/uL (ref 150–400)
RBC: 2.52 MIL/uL — ABNORMAL LOW (ref 4.22–5.81)
RDW: 19.1 % — ABNORMAL HIGH (ref 11.5–15.5)
WBC: 5.1 10*3/uL (ref 4.0–10.5)
nRBC: 0.6 % — ABNORMAL HIGH (ref 0.0–0.2)

## 2023-10-06 LAB — HEMOGLOBIN AND HEMATOCRIT, BLOOD
HCT: 28.2 % — ABNORMAL LOW (ref 39.0–52.0)
HCT: 24.5 % — ABNORMAL LOW (ref 39.0–52.0)
Hemoglobin: 9 g/dL — ABNORMAL LOW (ref 13.0–17.0)
Hemoglobin: 8.1 g/dL — ABNORMAL LOW (ref 13.0–17.0)

## 2023-10-06 LAB — BASIC METABOLIC PANEL
Anion gap: 10 (ref 5–15)
BUN: 24 mg/dL — ABNORMAL HIGH (ref 8–23)
CO2: 27 mmol/L (ref 22–32)
Calcium: 8.6 mg/dL — ABNORMAL LOW (ref 8.9–10.3)
Chloride: 100 mmol/L (ref 98–111)
Creatinine, Ser: 0.93 mg/dL (ref 0.61–1.24)
GFR, Estimated: >60 mL/min (ref >60)
Glucose, Bld: 162 mg/dL — ABNORMAL HIGH (ref 70–99)
Potassium: 3.8 mmol/L (ref 3.5–5.1)
Sodium: 137 mmol/L (ref 135–145)

## 2023-10-06 LAB — HEMOGLOBIN A1C
Hgb A1c MFr Bld: 5.9 % — ABNORMAL HIGH (ref 4.8–5.6)
Mean Plasma Glucose: 122.63 mg/dL

## 2023-10-06 MED ORDER — PANTOPRAZOLE SODIUM 40 MG IV SOLR
40.0000 mg | Freq: Two times a day (BID) | INTRAVENOUS | Status: DC
  Administered 2023-10-06 – 2023-10-10 (×10): 40 mg via INTRAVENOUS
  Filled 2023-10-06 (×10): qty 10

## 2023-10-06 MED ORDER — INSULIN ASPART 100 UNIT/ML IJ SOLN
0.0000 [IU] | Freq: Three times a day (TID) | INTRAMUSCULAR | Status: DC
  Administered 2023-10-06: 2 [IU] via SUBCUTANEOUS
  Administered 2023-10-06: 1 [IU] via SUBCUTANEOUS
  Administered 2023-10-07: 2 [IU] via SUBCUTANEOUS
  Administered 2023-10-07: 3 [IU] via SUBCUTANEOUS
  Administered 2023-10-08: 2 [IU] via SUBCUTANEOUS

## 2023-10-06 MED ORDER — INSULIN ASPART 100 UNIT/ML IJ SOLN
0.0000 [IU] | Freq: Every day | INTRAMUSCULAR | Status: DC
Start: 2023-10-06 — End: 2023-10-10

## 2023-10-06 NOTE — Progress Notes (Signed)
PROGRESS NOTE    Jeremiah [name]  WUJ:[id number] DOB: 23-Aug-1946 DOA: 10/05/2023 PCP: [name], MD  Chief Complaint  Patient presents with   Shortness of Breath   Rectal Bleeding    Brief Narrative:   Jeremiah [name] is Jeremiah [name] 77 y.o. male with medical history significant of HTN, HLD, T2DM, CAD s/p CABG, HF, COPD on nighttime 4 L, Afib on Eliquis, ischemic colitis s/p SMA stenting on DAPT and recurrent GI bleeds who presented to ED for evaluation of black tarry stools and shortness of breath and admitted for GI bleed.  Assessment & Plan:   Principal Problem:   GI bleed Active Problems:   Exertional shortness of breath  # GI bleed # Symptomatic anemia # Hx iron deficiency anemia Patient with Jeremiah [name] history of recurrent GI bleeds here for evaluation of ongoing black tarry stool with associated fatigue and lightheadedness. Hemoglobin down to 6.3, from 12.5 3 weeks ago.  He has had extensive workup with EGD, colonoscopy and capsule endoscop (see care everywhere capsule endoscopy study from 06/2023). He has history of suspected diverticular bleeds as well as imaging suggestive of bleeding at branch of SMA (04/2023 - he declined IR embolization at that time).  He is taking aspirin at home. S/p capsule study June 12, 2023 with fresh blood in distal ileum without identifiable lesion.  Recommending repeat ileocolonoscopy vs balloon endoscopy at that time.  He's been told he needs to follow at [facility].  CTA without active GI or mesenteric bleed GI consulted, appreciate recs F/u posttransfusion H&H -> improved s/p 2 units, but downtrending again.  He reports brown stool yesterday, but his Hb has not stabilized.  Continue trending and transfuse as appropriate.    # Shant Hence-fib on Eliquis History of complete heart block status post pacemaker implanted about 2 months ago.  Not on anticoagulation due to recurrent bleeds. EKG shows ventricularly paced rhythm. He reports ongoing fatigue and lightheadedness but denies any  palpitations. EKG today with sinus rhythm. -Telemetry -Trend and replete electrolytes   # Diastolic heart failure Last TTE on 06/2023 showed EF 55 to 60%, moderate asymmetric LVH, G1 DD, severely dilated LA, and moderate mitral stenosis. Patient with progressive dyspnea on exertion over the last week however he is euvolemic on exam. -continue home Lasix 40 mg daily -KCl 20 mEq daily -Strict I&O's, daily weights   # Chronic hypoxic respiratory failure # COPD Patient remains on 4 L at night and occasionally 2 L during the day with activity.  He reports increased O2 needs at home the last week likely secondary to his anemia.  No wheezing or rales on exam. -Continue on supplemental O2 -continue home nebs   # CAD s/p CABG # HLD -ASA 81 mg daily -Atorvastatin 80 mg daily   # HTN BP fluctuating, mostly on the low normal side Continue lasix, spironolactone is on hold   # T2DM A1c down to 5.9% 1 month ago (this may underestimate A1c with his anemia, frequent transfusions) SSI   # Hypothyroidism TSH 4.2 -Synthroid 88 mcg daily   Hx Ischemic Colitis s/p Celiac and SMA Stenting (03/2023) Aspirin (vascular 04/2023 noted aspirin alone adequate at that time)  Peripheral vascular disease Patient reports ongoing claudication that is being worked out by vascular surgery in the outpatient.   Follow with vascular outpatient     DVT prophylaxis: SCD Code Status: full Family Communication: none Disposition:   Status is: Inpatient Remains inpatient appropriate because: awaiting GI eval, stability of H/H   Consultants:  GI  Procedures:  none  Antimicrobials:  Anti-infectives (From admission, onward)    None       Subjective: Notes brown stool yesterday Feeling better overall   Objective: Vitals:   10/05/23 1957 10/05/23 2307 10/06/23 0206 10/06/23 0737  BP: (!) 102/56 (!) 108/59 (!) 106/58 (!) 110/50  Pulse: 93 86    Resp: 18 13  15   Temp: 97.6 F (36.4 C) 98 F (36.7  C) 98 F (36.7 C) 97.6 F (36.4 C)  TempSrc: Oral Oral Oral Oral  SpO2: 99% 98%  93%  Weight:      Height:        Intake/Output Summary (Last 24 hours) at 10/06/2023 0859 Last data filed at 10/06/2023 0731 Gross per 24 hour  Intake 945.58 ml  Output 1101 ml  Net -155.42 ml   Filed Weights   10/05/23 0623  Weight: 92.5 kg    Examination:  General exam: Appears calm and comfortable, pleasant, talkative Respiratory system: CTAB Cardiovascular system: RRR Central nervous system: Alert and oriented. No focal neurological deficits. Extremities: no LEE   Data Reviewed: I have personally reviewed following labs and imaging studies  CBC: Recent Labs  Lab 10/05/23 0636 10/05/23 1801 10/06/23 0647  WBC 5.3  --  5.1  NEUTROABS  --   --  3.2  HGB 6.6* 8.8* 7.9*  HCT 20.9* 25.6* 23.9*  MCV 97.7  --  94.8  PLT 213  --  177    Basic Metabolic Panel: Recent Labs  Lab 10/05/23 0636 10/06/23 0647  NA 134* 137  K 3.6 3.8  CL 99 100  CO2 27 27  GLUCOSE 190* 162*  BUN 32* 24*  CREATININE 1.00 0.93  CALCIUM 8.8* 8.6*    GFR: Estimated Creatinine Clearance: 73.3 mL/min (by C-G formula based on SCr of 0.93 mg/dL).  Liver Function Tests: Recent Labs  Lab 10/05/23 0636  AST 17  ALT 17  ALKPHOS 60  BILITOT 0.7  PROT 7.0  ALBUMIN 3.8    CBG: Recent Labs  Lab 10/05/23 1121 10/05/23 2122 10/06/23 0609  GLUCAP 162* 214* 156*     Recent Results (from the past 240 hour(s))  MRSA Next Gen by PCR, Nasal     Status: None   Collection Time: 10/05/23  2:34 PM   Specimen: Nasal Mucosa; Nasal Swab  Result Value Ref Range Status   MRSA by PCR Next Gen NOT DETECTED NOT DETECTED Final    Comment: (NOTE) The GeneXpert MRSA Assay (FDA approved for NASAL specimens only), is one component of [facility] comprehensive MRSA colonization surveillance program. It is not intended to diagnose MRSA infection nor to guide or monitor treatment for MRSA infections. Test performance is not  FDA approved in patients less than 42 years old. Performed at [facility], [address] [place], [place] [address]          Radiology Studies: CT ANGIO GI BLEED  Result Date: 10/05/2023 CLINICAL DATA:  GI bleed, Hx of mesenteric bleed EXAM: CTA ABDOMEN AND PELVIS WITHOUT AND WITH CONTRAST TECHNIQUE: Multidetector CT imaging of the abdomen and pelvis was performed using the standard protocol during bolus administration of intravenous contrast. Multiplanar reconstructed images and MIPs were obtained and reviewed to evaluate the vascular anatomy. RADIATION DOSE REDUCTION: This exam was performed according to the departmental dose-optimization program which includes automated exposure control, adjustment of the mA and/or kV according to patient size and/or use of iterative reconstruction technique. CONTRAST:  OMNIPAQUE IOHEXOL 350  MG/ML SOLN COMPARISON:  CT abdomen pelvis 02/05/2023 FINDINGS: VASCULAR No active extravasation of intravenous contrast to suggest active hemorrhage. Aorta: Severe atherosclerotic plaque. Normal caliber aorta without aneurysm, dissection, vasculitis or significant stenosis. Celiac: Celiac origin stent patent. Patent without evidence of aneurysm, dissection, vasculitis or significant stenosis. SMA: SMA origin stent patent but severely narrowed (11:60). Patent without evidence of aneurysm, dissection, vasculitis or significant stenosis. Renals: Both renal arteries are patent without evidence of aneurysm, dissection, vasculitis, fibromuscular dysplasia or significant stenosis. IMA: Patent without evidence of aneurysm, dissection, vasculitis or significant stenosis. Inflow: Severe atherosclerotic plaque. Patent without evidence of aneurysm, dissection, vasculitis or significant stenosis. Proximal Outflow: Bilateral common femoral and visualized portions of the superficial and profunda femoral arteries are patent without evidence of aneurysm, dissection, vasculitis or  significant stenosis. Veins: No obvious venous abnormality within the limitations of this arterial phase study. Review of the MIP images confirms the above findings. NON-VASCULAR Lower chest: Partially visualized cardiac leads. Mitral annular calcification. No acute abnormality. Hepatobiliary: No focal liver abnormality. No gallstones, gallbladder wall thickening, or pericholecystic fluid. No biliary dilatation. Pancreas: No focal lesion. Normal pancreatic contour. No surrounding inflammatory changes. No main pancreatic ductal dilatation. Spleen: Normal in size without focal abnormality. Adrenals/Urinary Tract: No adrenal nodule bilaterally. Bilateral kidneys enhance symmetrically. No hydronephrosis. No hydroureter. The urinary bladder is unremarkable. Stomach/Bowel: 5 mm metallic density within the gastric lumen of unclear etiology. Stomach is within normal limits. No evidence of bowel wall thickening or dilatation. Colonic diverticulosis. Appendix appears normal. Lymphatic: No lymphadenopathy. Reproductive: Prostate is unremarkable. Other: No intraperitoneal free fluid. No intraperitoneal free gas. No organized fluid collection. Musculoskeletal: No abdominal wall hernia or abnormality. No suspicious lytic or blastic osseous lesions. No acute displaced fracture. Multilevel degenerative changes of the spine. IMPRESSION: VASCULAR 1. No active gastrointestinal or mesenteric bleed. 2. Aortic Atherosclerosis (ICD10-I70.0) -severe with Caydee Talkington patent origin celiac artery stent and severely stenosed but patent origin SMA stent. NON-VASCULAR 1. Stpehen Petitjean 5 mm metallic density within the gastric lumen of unclear etiology. Correlate with possible history of ingested foreign body or prior gastric procedure. Electronically Signed   By: Tish Frederickson M.D.   On: 10/05/2023 20:36   DG Chest 2 View  Result Date: 10/05/2023 CLINICAL DATA:  Shortness of breath. EXAM: CHEST - 2 VIEW COMPARISON:  10/02/2023 FINDINGS: The lungs are clear  without focal pneumonia, edema, pneumothorax or pleural effusion. Interstitial markings are diffusely coarsened with chronic features. Cardiopericardial silhouette is at upper limits of normal for size. Left permanent pacemaker again noted. No acute bony abnormality. IMPRESSION: Chronic interstitial coarsening without acute cardiopulmonary findings. Electronically Signed   By: Kennith Center M.D.   On: 10/05/2023 07:08        Scheduled Meds:  sodium chloride   Intravenous Once   aspirin EC  81 mg Oral Daily   atorvastatin  80 mg Oral Daily   budesonide  0.5 mg Nebulization BID   dicyclomine  10 mg Oral TID AC   fluticasone  1 spray Each Nare Daily   furosemide  40 mg Oral Daily   insulin aspart  0-15 Units Subcutaneous TID WC   ketoconazole  1 Application Topical Daily   levothyroxine  88 mcg Oral QAC breakfast   loratadine  10 mg Oral Daily   pantoprazole (PROTONIX) IV  40 mg Intravenous Q12H   potassium chloride  20 mEq Oral Daily   revefenacin  175 mcg Nebulization BID AC & HS   Continuous Infusions:   LOS: 1  day    Time spent: over 30 min    Lacretia Nicks, MD [facility]   To contact the attending provider between 7A-7P or the covering provider during after hours 7P-7A, please log into the web site [email or web address] and access using universal [place] password for that web site. If you do not have the password, please call the hospital operator.  10/06/2023, 8:59 AM

## 2023-10-06 NOTE — Consult Note (Addendum)
Reason for Consult: Melena and anemia Referring Physician: [facility]  Jeremiah [name] HPI: This is a 77 year old male with a PMH of recurrent GI bleeds, ischemic colitis s/p SMA stenting, afib on Eliquis, COPD on 4 L, CAD, DM, HTN, and hyperlipidemia admitted for progressive weakness, melena, and SOB.  Th epatient states that his symptoms of SOB worsened over the past 1-2 weeks.  During this time he reports having melena.  With his SOB he increased his home oxygen from 4 liters to 8 liter/min.  In the past he was evaluated here at [facility] in 2018, but his pulmonary status was very poor.  Subsequently he started to obtain all of his GI care at [facility]  Over this past year he had two admissions for ischemic colitis on 02/2023 and 03/2023.  An EGD on 03/17/2023 was positive for on mucosal oozing, but it was not clear if this was from pathology versus scope trauma.  A colonoscopy was performed on 03/18/2023 with a suspicion of a diverticular bleed.  A repeat colonoscopy was performed on 03/21/2023 and clots were noted in the left side of the colon.  During his hospitalization on 04/2024 he was found to have possible bleeding from a branch of the SMA, but he declined IR embolization.  In July 2024 an VCE was performed and there was evidence of fresh blood in the distal ileum.  Arrangements were being made for him to be evaluated at [facility] for a retrograde balloon ileoscopy.  Past Medical History:  Diagnosis Date   A-fib [facility])    Acute on chronic systolic (congestive) heart failure (HCC) 06/10/2017   Acute pulmonary edema (HCC)    Anemia    Aortic valve sclerosis 12/2018   Noted on ECHO   Arthritis    CAD (coronary artery disease)    a. 2006: CABG in 2006 with LIMA-LAD, SVG-OM1, and SVG-RPDA   Cardiomegaly 12/2018   Stable, noted on CXR   Carpal tunnel syndrome    Right   Chronic pain 03/21/2016   Colon cancer (HCC) 2006   COPD (chronic obstructive pulmonary disease) (HCC)    Diabetes mellitus without  complication (HCC)    DVT (deep venous thrombosis) (HCC)    Right   Essential hypertension 03/21/2016   GERD (gastroesophageal reflux disease)    History of blood transfusion    History of Clostridioides difficile infection    History of prostate cancer 03/21/2016   History of PSVT (paroxysmal supraventricular tachycardia)    HLD (hyperlipidemia)    HTN (hypertension)    Hx of CABG 2006   Hypercholesteremia 03/21/2016   Hypothyroidism    LAE (left atrial enlargement) 12/2018   Severe, Noted on ECHO   LVH (left ventricular hypertrophy) 12/2018   Mild, Noted on ECHO   Medication management 03/21/2016   Mitral annular calcification 12/2018   with mild MS, Noted on ECHO   Morbid obesity (HCC) 03/21/2016   Myocardial infarct (HCC)    OSA (obstructive sleep apnea) 03/21/2016   uses oxygen at night time   Pain in right ankle and joints of right foot 03/21/2016   Paronychia 03/21/2016   Phimosis    Pneumonia    Primary insomnia 03/21/2016   Prostate cancer (HCC) 2008   Pulmonary hypertension (HCC) 12/2018   Moderate, Noted on ECHO   Tobacco dependence 03/21/2016   Tricuspid regurgitation 12/2018   Mild, Noted on ECHO    Past Surgical History:  Procedure Laterality Date   ANKLE SURGERY Right 12/2013  CARDIAC CATHETERIZATION N/A 05/08/2016   Procedure: Left Heart Cath and Cors/Grafts Angiography;  Surgeon: Lyn Records, MD;  Location: [facility] INVASIVE CV LAB;  Service: Cardiovascular;  Laterality: N/A;   CIRCUMCISION N/A 10/07/2019   Procedure: CIRCUMCISION ADULT;  Surgeon: Crist Fat, MD;  Location: [facility] ORS;  Service: Urology;  Laterality: N/A;   CORONARY ARTERY BYPASS GRAFT  2006   x3   ELECTROPHYSIOLOGIC STUDY N/A 08/24/2016   Procedure: SVT Ablation;  Surgeon: Will Jorja Loa, MD;  Location: [facility] INVASIVE CV LAB;  Service: Cardiovascular;  Laterality: N/A;   PACEMAKER IMPLANT N/A 08/23/2023   Procedure: PACEMAKER IMPLANT;  Surgeon: Regan Lemming, MD;  Location: [facility]  INVASIVE CV LAB;  Service: Cardiovascular;  Laterality: N/A;   PROSTATE SURGERY  2008   PTCA     UMBILICAL HERNIA REPAIR N/A 05/26/2022   Procedure: PRIMARY REPAIR OF STRANGULATED UMBILICAL HERNIA WITH PARTIAL OMENTECTOMY;  Surgeon: Almond Lint, MD;  Location: [facility] OR;  Service: General;  Laterality: N/A;    Family History  Problem Relation Age of Onset   Dementia Mother    Diabetes Sister     Social History:  reports that he quit smoking about 6 years ago. His smoking use included cigarettes. He started smoking about 66 years ago. He has a 45 pack-year smoking history. He has never used smokeless tobacco. He reports that he does not currently use alcohol. He reports that he does not use drugs.  Allergies:  Allergies  Allergen Reactions   Shellfish Allergy Shortness Of Breath, Nausea And Vomiting and Rash    Scallops     Fluzone [Influenza Virus Vaccine] Nausea And Vomiting, Palpitations and Rash    Medications: Scheduled:  sodium chloride   Intravenous Once   aspirin EC  81 mg Oral Daily   atorvastatin  80 mg Oral Daily   budesonide  0.5 mg Nebulization BID   dicyclomine  10 mg Oral TID AC   fluticasone  1 spray Each Nare Daily   furosemide  40 mg Oral Daily   insulin aspart  0-5 Units Subcutaneous QHS   insulin aspart  0-9 Units Subcutaneous TID WC   ketoconazole  1 Application Topical Daily   levothyroxine  88 mcg Oral QAC breakfast   loratadine  10 mg Oral Daily   pantoprazole (PROTONIX) IV  40 mg Intravenous Q12H   potassium chloride  20 mEq Oral Daily   revefenacin  175 mcg Nebulization BID AC & HS   Continuous:  Results for orders placed or performed during the hospital encounter of 10/05/23 (from the past 24 hour(s))  MRSA Next Gen by PCR, Nasal     Status: None   Collection Time: 10/05/23  2:34 PM   Specimen: Nasal Mucosa; Nasal Swab  Result Value Ref Range   MRSA by PCR Next Gen NOT DETECTED NOT DETECTED  Hemoglobin and hematocrit, blood     Status: Abnormal    Collection Time: 10/05/23  6:01 PM  Result Value Ref Range   Hemoglobin 8.8 (L) 13.0 - 17.0 g/dL   HCT 63.8 (L) 75.6 - 43.3 %  Glucose, capillary     Status: Abnormal   Collection Time: 10/05/23  9:22 PM  Result Value Ref Range   Glucose-Capillary 214 (H) 70 - 99 mg/dL   Comment 1 Notify RN    Comment 2 Document in Chart   Glucose, capillary     Status: Abnormal   Collection Time: 10/06/23  6:09 AM  Result Value Ref Range  Glucose-Capillary 156 (H) 70 - 99 mg/dL   Comment 1 Notify RN    Comment 2 Document in Chart   CBC with Differential/Platelet     Status: Abnormal   Collection Time: 10/06/23  6:47 AM  Result Value Ref Range   WBC 5.1 4.0 - 10.5 K/uL   RBC 2.52 (L) 4.22 - 5.81 MIL/uL   Hemoglobin 7.9 (L) 13.0 - 17.0 g/dL   HCT 16.1 (L) 09.6 - 04.5 %   MCV 94.8 80.0 - 100.0 fL   MCH 31.3 26.0 - 34.0 pg   MCHC 33.1 30.0 - 36.0 g/dL   RDW 40.9 (H) 81.1 - 91.4 %   Platelets 177 150 - 400 K/uL   nRBC 0.6 (H) 0.0 - 0.2 %   Neutrophils Relative % 62 %   Neutro Abs 3.2 1.7 - 7.7 K/uL   Lymphocytes Relative 19 %   Lymphs Abs 1.0 0.7 - 4.0 K/uL   Monocytes Relative 13 %   Monocytes Absolute 0.7 0.1 - 1.0 K/uL   Eosinophils Relative 3 %   Eosinophils Absolute 0.2 0.0 - 0.5 K/uL   Basophils Relative 1 %   Basophils Absolute 0.0 0.0 - 0.1 K/uL   Immature Granulocytes 2 %   Abs Immature Granulocytes 0.10 (H) 0.00 - 0.07 K/uL  Basic metabolic panel     Status: Abnormal   Collection Time: 10/06/23  6:47 AM  Result Value Ref Range   Sodium 137 135 - 145 mmol/L   Potassium 3.8 3.5 - 5.1 mmol/L   Chloride 100 98 - 111 mmol/L   CO2 27 22 - 32 mmol/L   Glucose, Bld 162 (H) 70 - 99 mg/dL   BUN 24 (H) 8 - 23 mg/dL   Creatinine, Ser 7.82 0.61 - 1.24 mg/dL   Calcium 8.6 (L) 8.9 - 10.3 mg/dL   GFR, Estimated >95 >62 mL/min   Anion gap 10 5 - 15  Hemoglobin A1c     Status: Abnormal   Collection Time: 10/06/23  6:47 AM  Result Value Ref Range   Hgb A1c MFr Bld 5.9 (H) 4.8 - 5.6 %    Mean Plasma Glucose 122.63 mg/dL     CT ANGIO GI BLEED  Result Date: 10/05/2023 CLINICAL DATA:  GI bleed, Hx of mesenteric bleed EXAM: CTA ABDOMEN AND PELVIS WITHOUT AND WITH CONTRAST TECHNIQUE: Multidetector CT imaging of the abdomen and pelvis was performed using the standard protocol during bolus administration of intravenous contrast. Multiplanar reconstructed images and MIPs were obtained and reviewed to evaluate the vascular anatomy. RADIATION DOSE REDUCTION: This exam was performed according to the departmental dose-optimization program which includes automated exposure control, adjustment of the mA and/or kV according to patient size and/or use of iterative reconstruction technique. CONTRAST:  OMNIPAQUE IOHEXOL 350 MG/ML SOLN COMPARISON:  CT abdomen pelvis 02/05/2023 FINDINGS: VASCULAR No active extravasation of intravenous contrast to suggest active hemorrhage. Aorta: Severe atherosclerotic plaque. Normal caliber aorta without aneurysm, dissection, vasculitis or significant stenosis. Celiac: Celiac origin stent patent. Patent without evidence of aneurysm, dissection, vasculitis or significant stenosis. SMA: SMA origin stent patent but severely narrowed (11:60). Patent without evidence of aneurysm, dissection, vasculitis or significant stenosis. Renals: Both renal arteries are patent without evidence of aneurysm, dissection, vasculitis, fibromuscular dysplasia or significant stenosis. IMA: Patent without evidence of aneurysm, dissection, vasculitis or significant stenosis. Inflow: Severe atherosclerotic plaque. Patent without evidence of aneurysm, dissection, vasculitis or significant stenosis. Proximal Outflow: Bilateral common femoral and visualized portions of the superficial and profunda  femoral arteries are patent without evidence of aneurysm, dissection, vasculitis or significant stenosis. Veins: No obvious venous abnormality within the limitations of this arterial phase study. Review of  the MIP images confirms the above findings. NON-VASCULAR Lower chest: Partially visualized cardiac leads. Mitral annular calcification. No acute abnormality. Hepatobiliary: No focal liver abnormality. No gallstones, gallbladder wall thickening, or pericholecystic fluid. No biliary dilatation. Pancreas: No focal lesion. Normal pancreatic contour. No surrounding inflammatory changes. No main pancreatic ductal dilatation. Spleen: Normal in size without focal abnormality. Adrenals/Urinary Tract: No adrenal nodule bilaterally. Bilateral kidneys enhance symmetrically. No hydronephrosis. No hydroureter. The urinary bladder is unremarkable. Stomach/Bowel: 5 mm metallic density within the gastric lumen of unclear etiology. Stomach is within normal limits. No evidence of bowel wall thickening or dilatation. Colonic diverticulosis. Appendix appears normal. Lymphatic: No lymphadenopathy. Reproductive: Prostate is unremarkable. Other: No intraperitoneal free fluid. No intraperitoneal free gas. No organized fluid collection. Musculoskeletal: No abdominal wall hernia or abnormality. No suspicious lytic or blastic osseous lesions. No acute displaced fracture. Multilevel degenerative changes of the spine. IMPRESSION: VASCULAR 1. No active gastrointestinal or mesenteric bleed. 2. Aortic Atherosclerosis (ICD10-I70.0) -severe with a patent origin celiac artery stent and severely stenosed but patent origin SMA stent. NON-VASCULAR 1. A 5 mm metallic density within the gastric lumen of unclear etiology. Correlate with possible history of ingested foreign body or prior gastric procedure. Electronically Signed   By: Tish Frederickson M.D.   On: 10/05/2023 20:36   DG Chest 2 View  Result Date: 10/05/2023 CLINICAL DATA:  Shortness of breath. EXAM: CHEST - 2 VIEW COMPARISON:  10/02/2023 FINDINGS: The lungs are clear without focal pneumonia, edema, pneumothorax or pleural effusion. Interstitial markings are diffusely coarsened with chronic  features. Cardiopericardial silhouette is at upper limits of normal for size. Left permanent pacemaker again noted. No acute bony abnormality. IMPRESSION: Chronic interstitial coarsening without acute cardiopulmonary findings. Electronically Signed   By: Kennith Center M.D.   On: 10/05/2023 07:08    ROS:  As stated above in the HPI otherwise negative.  Blood pressure (!) 110/50, pulse 86, temperature 97.6 F (36.4 C), temperature source Oral, resp. rate 15, height 5\' 7"  (1.702 m), weight 92.5 kg, SpO2 93%.    PE: Gen: NAD, Alert and Oriented HEENT:  [place]/AT, EOMI Neck: Supple, no LAD Lungs: CTA Bilaterally CV: RRR without M/G/R ABD: Soft, NTND, +BS Ext: No C/C/E  Assessment/Plan: 1) Recurrent GI bleeding - suspected distal small bowel source.  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 2) Anemia. 3) Severe COPD. 4) Afib. 5) DM.   The patient requires further evaluation at [facility] with a balloon endoscopy, but he reports that [facility] is not accepting any referrals.  Dr. Nadara Mustard, his GI at [facility], recently let him know the news.  He was told that the only way for an endoscopic evaluation is to present to the ER at [facility] with bleeding.  Presuming that he has bleeding from a small bowel AVM, he can benefit with using octreotide.  He tells me that he has private insurance through his daughter.  If this is the case, he should be tried on sandostatin LAR 20 mg IM.  The only risk is that it is not clear if he can tolerate the injection.  The recommendation is to try him on [place] 100-200 mg TID for 1-2 weeks to see if he can tolerate the medication.  From my experience patients typically tolerate octreotide well.  Plan: 1) Transfuse and follow HGB. 2) Hold Eliquis for now.  3) Trial of Sandostatin LAR 20 mg once per month. 4) Follow up with Dr. Nadara Mustard upon discharge. 5) Advance to a regular diet. 6) [facility] will assume care in the AM.   Marylynne Keelin D 10/06/2023, 10:53 AM

## 2023-10-07 ENCOUNTER — Telehealth: Payer: Self-pay

## 2023-10-07 ENCOUNTER — Ambulatory Visit (HOSPITAL_COMMUNITY): Payer: Federal, State, Local not specified - PPO

## 2023-10-07 DIAGNOSIS — K5520 Angiodysplasia of colon without hemorrhage: Secondary | ICD-10-CM | POA: Diagnosis not present

## 2023-10-07 DIAGNOSIS — D500 Iron deficiency anemia secondary to blood loss (chronic): Secondary | ICD-10-CM

## 2023-10-07 DIAGNOSIS — D509 Iron deficiency anemia, unspecified: Secondary | ICD-10-CM | POA: Insufficient documentation

## 2023-10-07 DIAGNOSIS — K922 Gastrointestinal hemorrhage, unspecified: Secondary | ICD-10-CM | POA: Diagnosis not present

## 2023-10-07 LAB — GLUCOSE, CAPILLARY
Glucose-Capillary: 153 mg/dL — ABNORMAL HIGH (ref 70–99)
Glucose-Capillary: 231 mg/dL — ABNORMAL HIGH (ref 70–99)
Glucose-Capillary: 172 mg/dL — ABNORMAL HIGH (ref 70–99)
Glucose-Capillary: 196 mg/dL — ABNORMAL HIGH (ref 70–99)

## 2023-10-07 LAB — IRON AND TIBC
Iron: 432 ug/dL — ABNORMAL HIGH (ref 45–182)
Saturation Ratios: 103 % — ABNORMAL HIGH (ref 17.9–39.5)
TIBC: 420 ug/dL (ref 250–450)

## 2023-10-07 LAB — FERRITIN: Ferritin: 26 ng/mL (ref 24–336)

## 2023-10-07 LAB — HEMOGLOBIN AND HEMATOCRIT, BLOOD
HCT: 22.7 % — ABNORMAL LOW (ref 39.0–52.0)
HCT: 26.3 % — ABNORMAL LOW (ref 39.0–52.0)
Hemoglobin: 7.2 g/dL — ABNORMAL LOW (ref 13.0–17.0)
Hemoglobin: 8.5 g/dL — ABNORMAL LOW (ref 13.0–17.0)

## 2023-10-07 LAB — PREPARE RBC (CROSSMATCH)

## 2023-10-07 MED ORDER — IRON SUCROSE 500 MG IVPB - SIMPLE MED
500.0000 mg | Freq: Once | INTRAVENOUS | Status: DC
  Filled 2023-10-07: qty 275

## 2023-10-07 MED ORDER — IRON SUCROSE 500 MG IVPB - SIMPLE MED: 500.0000 mg | Freq: Once | INTRAVENOUS | Status: DC

## 2023-10-07 MED ORDER — REVEFENACIN 175 MCG/3ML IN SOLN
175.0000 ug | Freq: Every day | RESPIRATORY_TRACT | Status: DC
  Administered 2023-10-09 – 2023-10-10 (×2): 175 ug via RESPIRATORY_TRACT
  Filled 2023-10-07 (×3): qty 3

## 2023-10-07 MED ORDER — SODIUM CHLORIDE 0.9 % IV SOLN
500.0000 mg | Freq: Once | INTRAVENOUS | Status: AC
  Administered 2023-10-07: 500 mg via INTRAVENOUS
  Filled 2023-10-07: qty 25

## 2023-10-07 MED ORDER — SODIUM CHLORIDE 0.9% IV SOLUTION: Freq: Once | INTRAVENOUS | Status: AC

## 2023-10-07 NOTE — Telephone Encounter (Signed)
Patient referred to infusion pharmacy team for ambulatory infusion of IV iron.  Insurance - [employer]  Dx code - 50.9  IV Iron Therapy - Patient received 500 mg of Venofer inpatient. Preferred agent for payor is Feraheme. Will give Feraheme 510 mg IV x 1 @ [employer].  Infusion appointments - Scheduling team will schedule patient as soon as possible.    Demetrius Charity, PharmD

## 2023-10-07 NOTE — TOC Initial Note (Signed)
Transition of Care [facility]) - Initial/Assessment Note    Patient Details  Name: Jeremiah [name] MRN: [id number] Date of Birth: 1946/07/04  Transition of Care [facility]) CM/SW Contact:    Harriet Masson, RN Phone Number: 10/07/2023, 4:08 PM  Clinical Narrative:                 Spoke to patient regarding transition needs.  Patient lives with wife who transports him to apts. Patient has home 02 at night from [employer]. PCP confirmed.  No TOC needs at this time. Expected Discharge Plan: Home/Self Care Barriers to Discharge: Continued Medical Work up   Patient Goals and CMS Choice Patient states their goals for this hospitalization and ongoing recovery are:: return home          Expected Discharge Plan and Services       Living arrangements for the past 2 months: Single Family Home                                      Prior Living Arrangements/Services Living arrangements for the past 2 months: Single Family Home Lives with:: Spouse Patient language and need for interpreter reviewed:: Yes Do you feel safe going back to the place where you live?: Yes      Need for Family Participation in Patient Care: Yes (Comment) Care giver support system in place?: Yes (comment)   Criminal Activity/Legal Involvement Pertinent to Current Situation/Hospitalization: No - Comment as needed  Activities of Daily Living   ADL Screening (condition at time of admission) Independently performs ADLs?: Yes (appropriate for developmental age) Is the patient deaf or have difficulty hearing?: No Does the patient have difficulty seeing, even when wearing glasses/contacts?: No Does the patient have difficulty concentrating, remembering, or making decisions?: No  Permission Sought/Granted                  Emotional Assessment Appearance:: Appears stated age   Affect (typically observed): Accepting Orientation: : Oriented to Self, Oriented to Place, Oriented to  Time, Oriented to  Situation Alcohol / Substance Use: Not Applicable Psych Involvement: No (comment)  Admission diagnosis:  GI bleed [K92.2] Acute GI bleeding [K92.2] Exertional shortness of breath [R06.02] Symptomatic anemia [D64.9] Patient Active Problem List   Diagnosis Date Noted   Iron deficiency anemia, unspecified 10/07/2023   S/P placement of cardiac pacemaker 08/24/2023   Complete heart block (HCC) 08/22/2023   Aortic stenosis 08/22/2023   Heart block 08/22/2023   Cardiogenic shock (HCC) 08/22/2023   Peripheral arterial disease (HCC) 08/13/2023   Chronic diastolic (congestive) heart failure (HCC) 06/05/2023   Acute on chronic respiratory failure with hypoxia (HCC) 06/05/2023   Acute on chronic diastolic CHF (congestive heart failure) (HCC) 06/05/2023   Ischemic colitis (HCC) 02/18/2023   Pulmonary hypertension, unspecified (HCC) 02/18/2023   New onset atrial fibrillation (HCC) 02/18/2023   Mitral stenosis 02/18/2023   Iron deficiency anemia 10/07/2022   Former smoker 08/21/2022   Incarcerated umbilical hernia 05/26/2022   History of supraventricular tachycardia 05/26/2022   Type 2 diabetes mellitus with hyperglycemia, without long-term current use of insulin (HCC) 04/29/2020   Carotid artery calcification 09/09/2018   Coronary artery disease involving coronary bypass graft of native heart without angina pectoris 07/22/2017   COPD GOLD II with reversibility 04/03/2017   Chronic respiratory failure with hypoxia and hypercapnia (HCC) 04/03/2017   Hyperkalemia 02/26/2017   Prolonged QT interval 02/26/2017  Skin abscess 02/13/2017   Exertional shortness of breath 02/12/2017   Enteritis due to Clostridium difficile 01/05/2017   Acute GI bleeding 01/03/2017   Symptomatic anemia 01/03/2017   Hypothyroidism, acquired 01/03/2017   Cough    Diarrhea    AVNRT (AV nodal re-entry tachycardia) (HCC) 08/24/2016   NSTEMI (non-ST elevated myocardial infarction) (HCC) 05/05/2016   SVT  (supraventricular tachycardia) (HCC) 03/21/2016   S/P CABG x 3 03/21/2016   Morbid obesity due to excess calories (HCC) complicated by hbp/hyperlipidemia  03/21/2016   Essential hypertension 03/21/2016   Hyperlipidemia LDL goal <70 03/21/2016   Paronychia 03/21/2016   Tobacco dependence 03/21/2016   Pain in right ankle and joints of right foot 03/21/2016   Chronic pain 03/21/2016   OSA (obstructive sleep apnea) 03/21/2016   Medication management 03/21/2016   History of prostate cancer 03/21/2016   Primary insomnia 03/21/2016   Post-traumatic osteoarthritis of right ankle 11/07/2015   Closed displaced fracture of medial malleolus of right tibia 10/24/2015   PCP:  [name], MD Pharmacy:   [facility] [phone] [place], [place] - [address] AT [facility] [address] & [facility] [address] [place] [address] Phone: [phone] Fax: [phone]  [facility] [address] [place] [address] Phone: [phone] Fax: [phone]  [place] [facility] [address] [place] [address] Phone: [phone] Fax: [phone]     Social Determinants of Health (SDOH) Social History: SDOH Screenings   Food Insecurity: No Food Insecurity (10/05/2023)  Housing: Low Risk  (10/05/2023)  Transportation Needs: No Transportation Needs (10/05/2023)  Utilities: Not At Risk (10/05/2023)  Tobacco Use: Medium Risk (10/05/2023)   SDOH Interventions:     Readmission Risk Interventions     No data to display

## 2023-10-07 NOTE — Progress Notes (Signed)
Miscellaneous Pharmacy Consult- IV Iron Dosing   77 YOM with h/o recurrent GIB, reporting melena this admission (on Eliquis PTA for AF), presumably per small bowel AVM. Patient has received 2U PRBCs this admit- will supply ~ 500 mg elemental iron. In setting of ongoing blood loss, pharmacy has been consulted to dose IV iron.   Calculated iron deficit wiuth current Hgb of 7.2 is ~ 1,000 mg. Patient has a noted history of HF, so will use venofer to minimize volume.   PLAN: - Supplement 500 mg IV venofer x1  - will place referral for outpatient IV iron to build up stores long-term  Jani Gravel, PharmD Clinical Pharmacist  10/07/2023 9:13 AM

## 2023-10-07 NOTE — Progress Notes (Addendum)
Progress Note  Primary [facility]: [facility]  LOS: 2 days   Chief Complaint:GI bleed   Subjective   Patient states he has had 3 bowel movements since yesterday, two of them were dark. However, the most recent one was brown. Denies pain, nausea, vomiting. Eating full diet without difficulty.   Objective   Vital signs in last 24 hours: Temp:  [97.7 F (36.5 C)-98.4 F (36.9 C)] 98.4 F (36.9 C) (11/04 0745) Pulse Rate:  [68-90] 76 (11/04 0745) Resp:  [14-18] 18 (11/04 0745) BP: (80-139)/(49-81) 80/67 (11/04 0820)   Last BM recorded by nurses in past 5 days No data recorded  General:   male in no acute distress Heart:  Regular rate and rhythm; no murmurs Pulm: Clear anteriorly; no wheezing Abdomen: soft, nondistended, normal bowel sounds in all quadrants. Nontender without guarding. No organomegaly appreciated. Extremities:  No edema Neurologic:  Alert and  oriented x4;  No focal deficits.  Psych:  Cooperative. Normal mood and affect.  Intake/Output from previous day: 11/03 0701 - 11/04 0700 In: -  Out: 650 [Urine:650] Intake/Output this shift: No intake/output data recorded.  Studies/Results: CT ANGIO GI BLEED  Result Date: 10/05/2023 CLINICAL DATA:  GI bleed, Hx of mesenteric bleed EXAM: CTA ABDOMEN AND PELVIS WITHOUT AND WITH CONTRAST TECHNIQUE: Multidetector CT imaging of the abdomen and pelvis was performed using the standard protocol during bolus administration of intravenous contrast. Multiplanar reconstructed images and MIPs were obtained and reviewed to evaluate the vascular anatomy. RADIATION DOSE REDUCTION: This exam was performed according to the departmental dose-optimization program which includes automated exposure control, adjustment of the mA and/or kV according to patient size and/or use of iterative reconstruction technique. CONTRAST:  OMNIPAQUE IOHEXOL 350 MG/ML SOLN COMPARISON:  CT abdomen pelvis 02/05/2023 FINDINGS: VASCULAR No active extravasation of  intravenous contrast to suggest active hemorrhage. Aorta: Severe atherosclerotic plaque. Normal caliber aorta without aneurysm, dissection, vasculitis or significant stenosis. Celiac: Celiac origin stent patent. Patent without evidence of aneurysm, dissection, vasculitis or significant stenosis. SMA: SMA origin stent patent but severely narrowed (11:60). Patent without evidence of aneurysm, dissection, vasculitis or significant stenosis. Renals: Both renal arteries are patent without evidence of aneurysm, dissection, vasculitis, fibromuscular dysplasia or significant stenosis. IMA: Patent without evidence of aneurysm, dissection, vasculitis or significant stenosis. Inflow: Severe atherosclerotic plaque. Patent without evidence of aneurysm, dissection, vasculitis or significant stenosis. Proximal Outflow: Bilateral common femoral and visualized portions of the superficial and profunda femoral arteries are patent without evidence of aneurysm, dissection, vasculitis or significant stenosis. Veins: No obvious venous abnormality within the limitations of this arterial phase study. Review of the MIP images confirms the above findings. NON-VASCULAR Lower chest: Partially visualized cardiac leads. Mitral annular calcification. No acute abnormality. Hepatobiliary: No focal liver abnormality. No gallstones, gallbladder wall thickening, or pericholecystic fluid. No biliary dilatation. Pancreas: No focal lesion. Normal pancreatic contour. No surrounding inflammatory changes. No main pancreatic ductal dilatation. Spleen: Normal in size without focal abnormality. Adrenals/Urinary Tract: No adrenal nodule bilaterally. Bilateral kidneys enhance symmetrically. No hydronephrosis. No hydroureter. The urinary bladder is unremarkable. Stomach/Bowel: 5 mm metallic density within the gastric lumen of unclear etiology. Stomach is within normal limits. No evidence of bowel wall thickening or dilatation. Colonic diverticulosis. Appendix  appears normal. Lymphatic: No lymphadenopathy. Reproductive: Prostate is unremarkable. Other: No intraperitoneal free fluid. No intraperitoneal free gas. No organized fluid collection. Musculoskeletal: No abdominal wall hernia or abnormality. No suspicious lytic or blastic osseous lesions. No acute displaced fracture. Multilevel degenerative changes of  the spine. IMPRESSION: VASCULAR 1. No active gastrointestinal or mesenteric bleed. 2. Aortic Atherosclerosis (ICD10-I70.0) -severe with a patent origin celiac artery stent and severely stenosed but patent origin SMA stent. NON-VASCULAR 1. A 5 mm metallic density within the gastric lumen of unclear etiology. Correlate with possible history of ingested foreign body or prior gastric procedure. Electronically Signed   By: Tish Frederickson M.D.   On: 10/05/2023 20:36    Lab Results: Recent Labs    10/05/23 0636 10/05/23 1801 10/06/23 0647 10/06/23 1115 10/06/23 1949 10/07/23 0214  WBC 5.3  --  5.1  --   --   --   HGB 6.6*   < > 7.9* 9.0* 8.1* 7.2*  HCT 20.9*   < > 23.9* 28.2* 24.5* 22.7*  PLT 213  --  177  --   --   --    < > = values in this interval not displayed.   BMET Recent Labs    10/05/23 0636 10/06/23 0647  NA 134* 137  K 3.6 3.8  CL 99 100  CO2 27 27  GLUCOSE 190* 162*  BUN 32* 24*  CREATININE 1.00 0.93  CALCIUM 8.8* 8.6*   LFT Recent Labs    10/05/23 0636  PROT 7.0  ALBUMIN 3.8  AST 17  ALT 17  ALKPHOS 60  BILITOT 0.7   PT/INR No results for input(s): "LABPROT", "INR" in the last 72 hours.   Scheduled Meds:  sodium chloride   Intravenous Once   sodium chloride   Intravenous Once   aspirin EC  81 mg Oral Daily   atorvastatin  80 mg Oral Daily   budesonide  0.5 mg Nebulization BID   dicyclomine  10 mg Oral TID AC   fluticasone  1 spray Each Nare Daily   furosemide  40 mg Oral Daily   insulin aspart  0-5 Units Subcutaneous QHS   insulin aspart  0-9 Units Subcutaneous TID WC   ketoconazole  1 Application  Topical Daily   levothyroxine  88 mcg Oral QAC breakfast   loratadine  10 mg Oral Daily   pantoprazole (PROTONIX) IV  40 mg Intravenous Q12H   potassium chloride  20 mEq Oral Daily   revefenacin  175 mcg Nebulization BID AC & HS   Continuous Infusions:  iron sucrose (VENOFER) 500 mg in sodium chloride 0.9 % 250 mL IVPB        Patient profile:   77 year old male with past medical history of recurrent GI bleeds, ischemic colitis, s/p SMA stenting afib on Eliquis, COPD on 4 L, CAD, DM, HTN, and hyperlipidemia admitted for progressive weakness, melena, and SOB   An EGD on 03/17/2023 was positive for on mucosal oozing, but it was not clear if this was from pathology versus scope trauma. A colonoscopy was performed on 03/18/2023 with a suspicion of a diverticular bleed. A repeat colonoscopy was performed on 03/21/2023 and clots were noted in the left side of the colon. During his hospitalization on 04/2024 he was found to have possible bleeding from a branch of the SMA, but he declined IR embolization. In July 2024 an VCE was performed and there was evidence of fresh blood in the distal ileum.   Arrangements were being made for him to be evaluated at [facility] for a retrograde balloon ileoscopy but they are not accepting patients at this time.   Impression:   Recurrent GI bleeding - suspect small bowel source (AVM?) Anemia hgb 7.2 (down from 8.1)  COPD On home  O2  AFIB On anticoagulation (Eliquis)  DM   Plan:   - Check iron studies - Iron infusions - Continue octreotide injections - No further workup planned from GI standpoint. - Follow-up with primary GI to set up for iron infusions  Bayley M McMichael  10/07/2023, 9:29 AM   Attending physician's note   I have taken a history, reviewed the chart and examined the patient. I performed a substantive portion of this encounter, including complete performance of at least one of the key components, in conjunction with the APP. I agree with  the APP's note, impression and recommendations.   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 He has had extensive endoscopic evaluation, noted fresh Heme/oozing in distal ileum in July 2024 on small bowel video capsule study  Hemoglobin responded appropriately to PRBC transfusion He was having dark stool but since hospitalization is more brown IV iron infusion Will defer repeat endoscopic evaluation at this point  Will need frequent serial monitoring of CBC and iron panel with IV iron infusion as outpatient Resume Eliquis tomorrow if hemoglobin remains stable  GI will sign off, available if have any questions  The patient was provided an opportunity to ask questions and all were answered. The patient agreed with the plan and demonstrated an understanding of the instructions.   Iona Beard , MD [phone]

## 2023-10-07 NOTE — Progress Notes (Signed)
PROGRESS NOTE    Jeremiah [name]  BJY:[id number] DOB: 1946/04/17 DOA: 10/05/2023 PCP: [name], MD  Chief Complaint  Patient presents with   Shortness of Breath   Rectal Bleeding    Brief Narrative:   Jeremiah [name] is Jeremiah [name] 77 y.o. male with medical history significant of HTN, HLD, T2DM, CAD s/p CABG, HF, COPD on nighttime 4 L, Afib on Eliquis, ischemic colitis s/p SMA stenting on DAPT and recurrent GI bleeds who presented to ED for evaluation of black tarry stools and shortness of breath and admitted for GI bleed.  Assessment & Plan:   Principal Problem:   Acute GI bleeding Active Problems:   Exertional shortness of breath  # GI bleed # Symptomatic anemia # Hx iron deficiency anemia Patient with Jeremiah [name] history of recurrent GI bleeds here for evaluation of ongoing black tarry stool with associated fatigue and lightheadedness. Hemoglobin down to 6.3, from 12.5 3 weeks ago.  He has had extensive workup with EGD, colonoscopy and capsule endoscop (see care everywhere capsule endoscopy study from 06/2023). He has history of suspected diverticular bleeds as well as imaging suggestive of bleeding at branch of SMA (04/2023 - he declined IR embolization at that time).  He is taking aspirin at home. S/p capsule study June 12, 2023 with fresh blood in distal ileum without identifiable lesion.  Recommending repeat ileocolonoscopy vs balloon endoscopy at that time.  He's been told he needs to follow at [facility].  CTA without active GI or mesenteric bleed GI consulted, appreciate recs F/u posttransfusion H&H -> downtrending today.  Will give additional unit pRBC.  Appreciate GI recs.   # Jeremiah [name]-fib on Eliquis History of complete heart block status post pacemaker implanted about 2 months ago.  Not on anticoagulation due to recurrent bleeds. EKG shows ventricularly paced rhythm. He reports ongoing fatigue and lightheadedness but denies any palpitations. EKG today with sinus rhythm. -Telemetry -Trend and replete  electrolytes   # Diastolic heart failure Last TTE on 06/2023 showed EF 55 to 60%, moderate asymmetric LVH, G1 DD, severely dilated LA, and moderate mitral stenosis. Patient with progressive dyspnea on exertion over the last week however he is euvolemic on exam. -continue home Lasix 40 mg daily -KCl 20 mEq daily -Strict I&O's, daily weights   # Chronic hypoxic respiratory failure # COPD Patient remains on 4 L at night and occasionally 2 L during the day with activity.  He reports increased O2 needs at home the last week likely secondary to his anemia.  No wheezing or rales on exam. -Continue on supplemental O2 -continue home nebs   # CAD s/p CABG # HLD -ASA 81 mg daily -Atorvastatin 80 mg daily   # HTN BP fluctuating, mostly on the low normal side Continue lasix, spironolactone is on hold   # T2DM A1c down to 5.9% 1 month ago (this may underestimate A1c with his anemia, frequent transfusions) SSI   # Hypothyroidism TSH 4.2 -Synthroid 88 mcg daily   Hx Ischemic Colitis s/p Celiac and SMA Stenting (03/2023) Aspirin (vascular 04/2023 noted aspirin alone adequate at that time)  Peripheral vascular disease Patient reports ongoing claudication that is being worked out by vascular surgery in the outpatient.   Follow with vascular outpatient     DVT prophylaxis: SCD Code Status: full Family Communication: none Disposition:   Status is: Inpatient Remains inpatient appropriate because: awaiting GI eval, stability of H/H   Consultants:  GI  Procedures:  none  Antimicrobials:  Anti-infectives (From admission, onward)  None       Subjective: Dark stool yesterday  Objective: Vitals:   10/07/23 1037 10/07/23 1123 10/07/23 1257 10/07/23 1526  BP: (!) 99/49 (!) 109/59 (!) 110/53 90/76  Pulse: 68 77 68 68  Resp: 18  19   Temp: 98 F (36.7 C) 97.8 F (36.6 C) 98.1 F (36.7 C) (!) 97.5 F (36.4 C)  TempSrc: Oral Oral Oral Oral  SpO2:      Weight:      Height:         Intake/Output Summary (Last 24 hours) at 10/07/2023 1826 Last data filed at 10/07/2023 1256 Gross per 24 hour  Intake 408 ml  Output --  Net 408 ml   Filed Weights   10/05/23 0623  Weight: 92.5 kg    Examination:  General: No acute distress. Cardiovascular: RRR Lungs: unlabored Abdomen: Soft, nontender,distended Neurological: Alert and oriented 3. Moves all extremities 4 with equal strength. Cranial nerves II through XII grossly intact Extremities: No clubbing or cyanosis. No edema.   Data Reviewed: I have personally reviewed following labs and imaging studies  CBC: Recent Labs  Lab 10/05/23 0636 10/05/23 1801 10/06/23 0647 10/06/23 1115 10/06/23 1949 10/07/23 0214 10/07/23 1506  WBC 5.3  --  5.1  --   --   --   --   NEUTROABS  --   --  3.2  --   --   --   --   HGB 6.6*   < > 7.9* 9.0* 8.1* 7.2* 8.5*  HCT 20.9*   < > 23.9* 28.2* 24.5* 22.7* 26.3*  MCV 97.7  --  94.8  --   --   --   --   PLT 213  --  177  --   --   --   --    < > = values in this interval not displayed.    Basic Metabolic Panel: Recent Labs  Lab 10/05/23 0636 10/06/23 0647  NA 134* 137  K 3.6 3.8  CL 99 100  CO2 27 27  GLUCOSE 190* 162*  BUN 32* 24*  CREATININE 1.00 0.93  CALCIUM 8.8* 8.6*    GFR: Estimated Creatinine Clearance: 73.3 mL/min (by C-G formula based on SCr of 0.93 mg/dL).  Liver Function Tests: Recent Labs  Lab 10/05/23 0636  AST 17  ALT 17  ALKPHOS 60  BILITOT 0.7  PROT 7.0  ALBUMIN 3.8    CBG: Recent Labs  Lab 10/06/23 1534 10/06/23 2114 10/07/23 0621 10/07/23 1124 10/07/23 1555  GLUCAP 161* 172* 153* 231* 172*     Recent Results (from the past 240 hour(s))  MRSA Next Gen by PCR, Nasal     Status: None   Collection Time: 10/05/23  2:34 PM   Specimen: Nasal Mucosa; Nasal Swab  Result Value Ref Range Status   MRSA by PCR Next Gen NOT DETECTED NOT DETECTED Final    Comment: (NOTE) The GeneXpert MRSA Assay (FDA approved for NASAL specimens  only), is one component of Jeremiah [name] comprehensive MRSA colonization surveillance program. It is not intended to diagnose MRSA infection nor to guide or monitor treatment for MRSA infections. Test performance is not FDA approved in patients less than 48 years old. Performed at [facility], [address] [place], [place] [address]          Radiology Studies: No results found.      Scheduled Meds:  sodium chloride   Intravenous Once   aspirin EC  81 mg Oral  Daily   atorvastatin  80 mg Oral Daily   budesonide  0.5 mg Nebulization BID   dicyclomine  10 mg Oral TID AC   fluticasone  1 spray Each Nare Daily   furosemide  40 mg Oral Daily   insulin aspart  0-5 Units Subcutaneous QHS   insulin aspart  0-9 Units Subcutaneous TID WC   ketoconazole  1 Application Topical Daily   levothyroxine  88 mcg Oral QAC breakfast   loratadine  10 mg Oral Daily   pantoprazole (PROTONIX) IV  40 mg Intravenous Q12H   potassium chloride  20 mEq Oral Daily   revefenacin  175 mcg Nebulization BID AC & HS   Continuous Infusions:   LOS: 2 days    Time spent: over 30 min    Lacretia Nicks, MD [facility]   To contact the attending provider between 7A-7P or the covering provider during after hours 7P-7A, please log into the web site [email or web address] and access using universal [place] password for that web site. If you do not have the password, please call the hospital operator.  10/07/2023, 6:26 PM

## 2023-10-08 ENCOUNTER — Other Ambulatory Visit: Payer: Self-pay | Admitting: Medical Oncology

## 2023-10-08 ENCOUNTER — Telehealth: Payer: Self-pay | Admitting: Medical Oncology

## 2023-10-08 DIAGNOSIS — D500 Iron deficiency anemia secondary to blood loss (chronic): Secondary | ICD-10-CM

## 2023-10-08 DIAGNOSIS — K922 Gastrointestinal hemorrhage, unspecified: Secondary | ICD-10-CM | POA: Diagnosis not present

## 2023-10-08 LAB — CBC
HCT: 24.5 % — ABNORMAL LOW (ref 39.0–52.0)
Hemoglobin: 7.8 g/dL — ABNORMAL LOW (ref 13.0–17.0)
MCH: 30.8 pg (ref 26.0–34.0)
MCHC: 31.8 g/dL (ref 30.0–36.0)
MCV: 96.8 fL (ref 80.0–100.0)
Platelets: 174 10*3/uL (ref 150–400)
RBC: 2.53 MIL/uL — ABNORMAL LOW (ref 4.22–5.81)
RDW: 18.9 % — ABNORMAL HIGH (ref 11.5–15.5)
WBC: 5.1 10*3/uL (ref 4.0–10.5)
nRBC: 0.4 % — ABNORMAL HIGH (ref 0.0–0.2)

## 2023-10-08 LAB — GLUCOSE, CAPILLARY
Glucose-Capillary: 152 mg/dL — ABNORMAL HIGH (ref 70–99)
Glucose-Capillary: 175 mg/dL — ABNORMAL HIGH (ref 70–99)
Glucose-Capillary: 165 mg/dL — ABNORMAL HIGH (ref 70–99)
Glucose-Capillary: 233 mg/dL — ABNORMAL HIGH (ref 70–99)

## 2023-10-08 LAB — BASIC METABOLIC PANEL
Anion gap: 6 (ref 5–15)
BUN: 21 mg/dL (ref 8–23)
CO2: 24 mmol/L (ref 22–32)
Calcium: 8.4 mg/dL — ABNORMAL LOW (ref 8.9–10.3)
Chloride: 105 mmol/L (ref 98–111)
Creatinine, Ser: 0.91 mg/dL (ref 0.61–1.24)
GFR, Estimated: >60 mL/min (ref >60)
Glucose, Bld: 231 mg/dL — ABNORMAL HIGH (ref 70–99)
Potassium: 3.6 mmol/L (ref 3.5–5.1)
Sodium: 135 mmol/L (ref 135–145)

## 2023-10-08 LAB — PHOSPHORUS: Phosphorus: 2.9 mg/dL (ref 2.5–4.6)

## 2023-10-08 LAB — MAGNESIUM: Magnesium: 2.1 mg/dL (ref 1.7–2.4)

## 2023-10-08 LAB — HEMOGLOBIN AND HEMATOCRIT, BLOOD
HCT: 26.5 % — ABNORMAL LOW (ref 39.0–52.0)
Hemoglobin: 8.6 g/dL — ABNORMAL LOW (ref 13.0–17.0)

## 2023-10-08 MED ORDER — ENSURE ENLIVE PO LIQD
237.0000 mL | Freq: Two times a day (BID) | ORAL | Status: DC
  Administered 2023-10-08 – 2023-10-10 (×5): 237 mL via ORAL

## 2023-10-08 MED ORDER — BUDESONIDE 0.5 MG/2ML IN SUSP
0.5000 mg | Freq: Two times a day (BID) | RESPIRATORY_TRACT | Status: DC
  Administered 2023-10-08 – 2023-10-10 (×5): 0.5 mg via RESPIRATORY_TRACT
  Filled 2023-10-08 (×5): qty 2

## 2023-10-08 NOTE — Plan of Care (Signed)

## 2023-10-08 NOTE — Progress Notes (Signed)
Pt H&H was attempted to be collected around 1230 today and after 2 sticks pt asked for another phlebotomist, this RN called Phlebotomy around 1245 and was told another person would come, no one has came this RN called again around 1400, no answer, this has been escalated to Charge RN and MD Lowell Guitar has be notified.   Balinda Quails, RN 10/08/2023 2:12 PM

## 2023-10-08 NOTE — Telephone Encounter (Signed)
He is wanting to get set up frequent  blood work in case he needs blood transfusion.

## 2023-10-08 NOTE — Progress Notes (Addendum)
PROGRESS NOTE    Jeremiah [name]  RJJ:[id number] DOB: [birth year] DOA: 10/05/2023 PCP: [name], MD  Chief Complaint  Patient presents with   Shortness of Breath   Rectal Bleeding    Brief Narrative:   Jeremiah [name] is Jeremiah [name] 77 y.o. male with medical history significant of HTN, HLD, T2DM, CAD s/p CABG, HF, COPD on nighttime 4 L, Afib on Eliquis, ischemic colitis s/p SMA stenting on DAPT and recurrent GI bleeds who presented to ED for evaluation of black tarry stools and shortness of breath and admitted for GI bleed.  Assessment & Plan:   Principal Problem:   Acute GI bleeding Active Problems:   Exertional shortness of breath  # GI bleed # Symptomatic anemia # Hx iron deficiency anemia Patient with Jeremiah [name] history of recurrent GI bleeds here for evaluation of ongoing black tarry stool with associated fatigue and lightheadedness. Hemoglobin down to 6.3, from 12.5 3 weeks ago.  He has had extensive workup with EGD, colonoscopy and capsule endoscop (see care everywhere capsule endoscopy study from 06/2023). He has history of suspected diverticular bleeds as well as imaging suggestive of bleeding at branch of SMA (04/2023 - he declined IR embolization at that time).  He is taking aspirin at home. S/p capsule study June 12, 2023 with fresh blood in distal ileum without identifiable lesion.  Recommending repeat ileocolonoscopy vs balloon endoscopy at that time.  He's been told he needs to follow at [facility].  CTA without active GI or mesenteric bleed GI consulted, appreciate recs - they've signed off - s/p IV iron - defer repeat endoscopy at this point.  He'll need frequent monitoring of CBC/iron studies outpatient.  Dr. Lavon Paganini notes insurance won't cover octreotide.  F/u posttransfusion H&H -> S/p 3 units pRBC.  Hb fluctuating.  Will continue to monitor, he notes dark stools and blood when wiping.  See prior GI recs, now signed off, will reconsult if necessary.   # Jeremiah [name]  History of complete heart block  status post pacemaker implanted about 2 months ago.  Not on anticoagulation due to recurrent bleeds. EKG shows ventricularly paced rhythm. He reports ongoing fatigue and lightheadedness but denies any palpitations. EKG today with sinus rhythm. -Telemetry -Trend and replete electrolytes   # Diastolic heart failure Last TTE on 06/2023 showed EF 55 to 60%, moderate asymmetric LVH, G1 DD, severely dilated LA, and moderate mitral stenosis. Patient with progressive dyspnea on exertion over the last week however he is euvolemic on exam. -continue home Lasix 40 mg daily -KCl 20 mEq daily -Strict I&O's, daily weights   # Chronic hypoxic respiratory failure # COPD -has o2 at home (4 L at night), currently on RA -Continue on supplemental O2 -continue home nebs   # CAD s/p CABG # HLD -ASA 81 mg daily -Atorvastatin 80 mg daily   # HTN BP fluctuating, mostly on the low normal side Continue lasix, spironolactone is on hold (consider holding spironolactone on discharge)   # T2DM A1c down to 5.9% 1 month ago (this may underestimate A1c with his anemia, frequent transfusions) SSI   # Hypothyroidism TSH 4.2 -Synthroid 88 mcg daily   Hx Ischemic Colitis s/p Celiac and SMA Stenting (03/2023) Aspirin (vascular 04/2023 noted aspirin alone adequate at that time)  Peripheral vascular disease Patient reports ongoing claudication that is being worked out by vascular surgery in the outpatient.   Follow with vascular outpatient     DVT prophylaxis: SCD Code Status: full Family Communication: none Disposition:   Status is:  Inpatient Remains inpatient appropriate because: awaiting GI eval, stability of H/H   Consultants:  GI  Procedures:  none  Antimicrobials:  Anti-infectives (From admission, onward)    None       Subjective: Blood when he wiped Dark stool again   Objective: Vitals:   10/07/23 2349 10/08/23 0413 10/08/23 0742 10/08/23 0754  BP: (!) 112/58 (!) 101/54  113/85   Pulse:  78    Resp: 19 19  18   Temp: 98.1 F (36.7 C) 98.3 F (36.8 C)  97.9 F (36.6 C)  TempSrc: Oral Oral  Oral  SpO2: 92% 95% 96%   Weight:      Height:        Intake/Output Summary (Last 24 hours) at 10/08/2023 0916 Last data filed at 10/07/2023 2230 Gross per 24 hour  Intake 828 ml  Output --  Net 828 ml   Filed Weights   10/05/23 0623  Weight: 92.5 kg    Examination:  General: No acute distress. Cardiovascular: RRR Lungs: unlabored Neurological: Alert and oriented 3. Moves all extremities 4 with equal strength. Cranial nerves II through XII grossly intact. Extremities: No clubbing or cyanosis. No edema.  Data Reviewed: I have personally reviewed following labs and imaging studies  CBC: Recent Labs  Lab 10/05/23 0636 10/05/23 1801 10/06/23 0647 10/06/23 1115 10/06/23 1949 10/07/23 0214 10/07/23 1506 10/08/23 0222  WBC 5.3  --  5.1  --   --   --   --  5.1  NEUTROABS  --   --  3.2  --   --   --   --   --   HGB 6.6*   < > 7.9* 9.0* 8.1* 7.2* 8.5* 7.8*  HCT 20.9*   < > 23.9* 28.2* 24.5* 22.7* 26.3* 24.5*  MCV 97.7  --  94.8  --   --   --   --  96.8  PLT 213  --  177  --   --   --   --  174   < > = values in this interval not displayed.    Basic Metabolic Panel: Recent Labs  Lab 10/05/23 0636 10/06/23 0647 10/08/23 0222  NA 134* 137 135  K 3.6 3.8 3.6  CL 99 100 105  CO2 27 27 24   GLUCOSE 190* 162* 231*  BUN 32* 24* 21  CREATININE 1.00 0.93 0.91  CALCIUM 8.8* 8.6* 8.4*  MG  --   --  2.1  PHOS  --   --  2.9    GFR: Estimated Creatinine Clearance: 74.9 mL/min (by C-G formula based on SCr of 0.91 mg/dL).  Liver Function Tests: Recent Labs  Lab 10/05/23 0636  AST 17  ALT 17  ALKPHOS 60  BILITOT 0.7  PROT 7.0  ALBUMIN 3.8    CBG: Recent Labs  Lab 10/07/23 0621 10/07/23 1124 10/07/23 1555 10/07/23 2111 10/08/23 0607  GLUCAP 153* 231* 172* 196* 152*     Recent Results (from the past 240 hour(s))  MRSA Next Gen by PCR,  Nasal     Status: None   Collection Time: 10/05/23  2:34 PM   Specimen: Nasal Mucosa; Nasal Swab  Result Value Ref Range Status   MRSA by PCR Next Gen NOT DETECTED NOT DETECTED Final    Comment: (NOTE) The GeneXpert MRSA Assay (FDA approved for NASAL specimens only), is one component of Arelene Moroni comprehensive MRSA colonization surveillance program. It is not intended to diagnose MRSA infection nor to guide or monitor treatment for  MRSA infections. Test performance is not FDA approved in patients less than 15 years old. Performed at [facility], [address] [place], [place] [address]          Radiology Studies: No results found.      Scheduled Meds:  sodium chloride   Intravenous Once   aspirin EC  81 mg Oral Daily   atorvastatin  80 mg Oral Daily   budesonide  0.5 mg Nebulization BID   dicyclomine  10 mg Oral TID AC   feeding supplement  237 mL Oral BID BM   fluticasone  1 spray Each Nare Daily   furosemide  40 mg Oral Daily   insulin aspart  0-5 Units Subcutaneous QHS   insulin aspart  0-9 Units Subcutaneous TID WC   ketoconazole  1 Application Topical Daily   levothyroxine  88 mcg Oral QAC breakfast   loratadine  10 mg Oral Daily   pantoprazole (PROTONIX) IV  40 mg Intravenous Q12H   potassium chloride  20 mEq Oral Daily   [START ON 10/09/2023] revefenacin  175 mcg Nebulization Daily   Continuous Infusions:   LOS: 3 days    Time spent: over 30 min    Lacretia Nicks, MD [facility]   To contact the attending provider between 7A-7P or the covering provider during after hours 7P-7A, please log into the web site [email or web address] and access using universal [place] password for that web site. If you do not have the password, please call the hospital operator.  10/08/2023, 9:16 AM

## 2023-10-08 NOTE — Telephone Encounter (Signed)
Per Dr Arbutus Ped , we will see him for labs and transfusion as available.  Marland Kitchen He will call back with hgb from his next draw . I told him he needs to get transfused in the hospital if labs indicate it.   Schedule message sent.

## 2023-10-09 ENCOUNTER — Telehealth: Payer: Self-pay | Admitting: Gastroenterology

## 2023-10-09 ENCOUNTER — Encounter: Payer: Self-pay | Admitting: Family Medicine

## 2023-10-09 DIAGNOSIS — K922 Gastrointestinal hemorrhage, unspecified: Secondary | ICD-10-CM | POA: Diagnosis not present

## 2023-10-09 LAB — TYPE AND SCREEN
ABO/RH(D): O POS
Antibody Screen: POSITIVE
Donor AG Type: NEGATIVE
Donor AG Type: NEGATIVE
Donor AG Type: NEGATIVE
Unit division: 0
Unit division: 0
Unit division: 0
Unit division: 0

## 2023-10-09 LAB — CBC
HCT: 23.9 % — ABNORMAL LOW (ref 39.0–52.0)
Hemoglobin: 7.4 g/dL — ABNORMAL LOW (ref 13.0–17.0)
MCH: 30.3 pg (ref 26.0–34.0)
MCHC: 31 g/dL (ref 30.0–36.0)
MCV: 98 fL (ref 80.0–100.0)
Platelets: 179 10*3/uL (ref 150–400)
RBC: 2.44 MIL/uL — ABNORMAL LOW (ref 4.22–5.81)
RDW: 20 % — ABNORMAL HIGH (ref 11.5–15.5)
WBC: 6.1 10*3/uL (ref 4.0–10.5)
nRBC: 0.5 % — ABNORMAL HIGH (ref 0.0–0.2)

## 2023-10-09 LAB — HEMOGLOBIN AND HEMATOCRIT, BLOOD
HCT: 27.4 % — ABNORMAL LOW (ref 39.0–52.0)
Hemoglobin: 8.8 g/dL — ABNORMAL LOW (ref 13.0–17.0)

## 2023-10-09 LAB — GLUCOSE, CAPILLARY
Glucose-Capillary: 183 mg/dL — ABNORMAL HIGH (ref 70–99)
Glucose-Capillary: 172 mg/dL — ABNORMAL HIGH (ref 70–99)
Glucose-Capillary: 186 mg/dL — ABNORMAL HIGH (ref 70–99)
Glucose-Capillary: 193 mg/dL — ABNORMAL HIGH (ref 70–99)

## 2023-10-09 LAB — BASIC METABOLIC PANEL
Anion gap: 7 (ref 5–15)
BUN: 25 mg/dL — ABNORMAL HIGH (ref 8–23)
CO2: 26 mmol/L (ref 22–32)
Calcium: 8.9 mg/dL (ref 8.9–10.3)
Chloride: 105 mmol/L (ref 98–111)
Creatinine, Ser: 1.11 mg/dL (ref 0.61–1.24)
GFR, Estimated: >60 mL/min (ref >60)
Glucose, Bld: 200 mg/dL — ABNORMAL HIGH (ref 70–99)
Potassium: 4.5 mmol/L (ref 3.5–5.1)
Sodium: 138 mmol/L (ref 135–145)

## 2023-10-09 LAB — BPAM RBC
Blood Product Expiration Date: 202411112359
Blood Product Expiration Date: 202411202359
Blood Product Expiration Date: 202412022359
Blood Product Expiration Date: 202412022359
ISSUE DATE / TIME: 202411020903
ISSUE DATE / TIME: 202411021316
ISSUE DATE / TIME: 202411041018
Unit Type and Rh: 5100
Unit Type and Rh: 5100
Unit Type and Rh: 5100
Unit Type and Rh: 5100

## 2023-10-09 LAB — PREPARE RBC (CROSSMATCH)

## 2023-10-09 MED ORDER — SODIUM CHLORIDE 0.9% IV SOLUTION: Freq: Once | INTRAVENOUS | Status: AC

## 2023-10-09 NOTE — Progress Notes (Signed)
PROGRESS NOTE  Jeremiah [name]  IHK:[id number] DOB: 1946-06-30 DOA: 10/05/2023 PCP: [name], MD   Brief Narrative:  This 77 yrs old male with PMH significant for HTN, HLD, T2DM, CAD s/p CABG, HF, COPD on nighttime 4 L, Afib on Eliquis, ischemic colitis s/p SMA stenting on DAPT and recurrent GI bleeds who presented to ED for evaluation of black tarry stools and shortness of breath and admitted for GI bleed.   Assessment & Plan:   Principal Problem:   Acute GI bleeding Active Problems:   Exertional shortness of breath  Symptomatic anemia / Acute GI bleed: Hx iron deficiency anemia: Patient with hx. of recurrent GI bleed presented for the evaluation of ongoing black tarry stools with associated fatigue and lightheadedness. His Hb down to 6.3, from 12.5 three weeks ago.  He has had extensive workup with EGD, colonoscopy and capsule endoscop (see care everywhere capsule endoscopy study from 06/2023). He has history of suspected diverticular bleeds as well as imaging suggestive of bleeding at branch of SMA (04/2023 - he declined IR embolization at that time).  He is taking aspirin at home. S/p capsule endoscopy on June 12, 2023 with fresh blood in distal ileum without identifiable lesion.   GI recommended repeat ileocolonoscopy vs balloon endoscopy at that time.  He's been told he needs to follow at [facility].  CTA without active GI or mesenteric bleed. GI consulted, appreciate recs - they've signed off - s/p IV iron - defer repeat endoscopy at this point.   He'll need frequent monitoring of CBC/iron studies outpatient.  Dr. Lavon Paganini notes insurance won't cover octreotide.  F/u posttransfusion H&H -> S/p 3 units pRBC.  Hb fluctuating.   Continue to monitor H/H. He notes dark stools and blood when wiping.   Hb dropped to 7.4 , transfuse 1 unit PRBC.   Atrial Fibrillation: Patient has hx. of complete heart block status post pacemaker implanted about 2 months ago.   Not on anticoagulation due to  recurrent bleeds. EKG shows ventricularly paced rhythm.  He reports ongoing fatigue and lightheadedness but denies any palpitations. EKG today with sinus rhythm. Continue Telemetry Continue to Trend and replete electrolytes   Diastolic heart failure: Last TTE on 06/2023 showed LVEF 55 to 60%, moderate asymmetric LVH, G1 DD, severely dilated LA, and moderate mitral stenosis.  Patient with progressive dyspnea on exertion over the last week however he is euvolemic on exam. Continue home Lasix 40 mg daily Continue KCl 20 mEq daily Strict I&O's, daily weights   Chronic hypoxic respiratory failure due to COPD Patient has o2 at home (4 L at night), currently on RA Continue on supplemental O2 Continue home nebs   CAD s/p CABG Continue ASA 81 mg daily. Continue Atorvastatin 80 mg daily.   Essential HTN: BP fluctuating, mostly on the low normal side Continue lasix, spironolactone is on hold (consider holding spironolactone on discharge)   Diabetes Mellitus II: HbA1c down to 5.9% 1 month ago (this may underestimate A1c with his anemia, frequent transfusions) SSI   Hypothyroidism Continue Synthroid 88 mcg daily   Hx Ischemic Colitis s/p Celiac and SMA Stenting (03/2023) Continue Aspirin (vascular 04/2023 noted aspirin alone adequate at that time).   Peripheral vascular disease Patient reports ongoing claudication that is being worked out by vascular surgery in the outpatient.   Follow with vascular outpatient      DVT prophylaxis: SCDs Code Status: Full code Family Communication: Wife in room Disposition Plan:   Status is: Inpatient Remains inpatient appropriate because:  Admitted for GI bleeding, GI evaluation completed but Hb dropped again requiring blood transfusion.   Anticipated discharge home tomorrow 10/10/2023  Consultants:  Gastroenterology.   Procedures:  Antimicrobials:  Anti-infectives (From admission, onward)    None      Subjective: Patient was seen and  examined at bedside.  Overnight events noted.  Wife in the room. Patient still reports having dark brown stools.  Denies any visible bleeding.   He does not feel comfortable going to be discharged today.  Objective: Vitals:   10/09/23 0722 10/09/23 0725 10/09/23 0728 10/09/23 1053  BP: 110/74 110/74  (!) 109/54  Pulse: 98 90    Resp: 20 20  16   Temp: 97.8 F (36.6 C)   97.8 F (36.6 C)  TempSrc: Oral   Oral  SpO2: 97% 97% 97% 97%  Weight:      Height:        Intake/Output Summary (Last 24 hours) at 10/09/2023 1110 Last data filed at 10/09/2023 0800 Gross per 24 hour  Intake 1078 ml  Output 0 ml  Net 1078 ml   Filed Weights   10/05/23 0623  Weight: 92.5 kg    Examination:  General exam: Appears calm and comfortable, deconditioned, not in any acute distress. Respiratory system: Clear to auscultation. Respiratory effort normal.  RR 16 Cardiovascular system: S1 & S2 heard, RRR. No JVD, murmurs, rubs, gallops or clicks. No pedal edema. Gastrointestinal system: Abdomen is nondistended, soft and nontender. Normal bowel sounds heard. Central nervous system: Alert and oriented X 3. No focal neurological deficits. Extremities: No edema, No clubbing, No cyanosis. Skin: No rashes, lesions or ulcers Psychiatry: Judgement and insight appear normal. Mood & affect appropriate.     Data Reviewed: I have personally reviewed following labs and imaging studies  CBC: Recent Labs  Lab 10/05/23 0636 10/05/23 1801 10/06/23 0647 10/06/23 1115 10/07/23 0214 10/07/23 1506 10/08/23 0222 10/08/23 1423 10/09/23 0223  WBC 5.3  --  5.1  --   --   --  5.1  --  6.1  NEUTROABS  --   --  3.2  --   --   --   --   --   --   HGB 6.6*   < > 7.9*   < > 7.2* 8.5* 7.8* 8.6* 7.4*  HCT 20.9*   < > 23.9*   < > 22.7* 26.3* 24.5* 26.5* 23.9*  MCV 97.7  --  94.8  --   --   --  96.8  --  98.0  PLT 213  --  177  --   --   --  174  --  179   < > = values in this interval not displayed.   Basic Metabolic  Panel: Recent Labs  Lab 10/05/23 0636 10/06/23 0647 10/08/23 0222 10/09/23 0223  NA 134* 137 135 138  K 3.6 3.8 3.6 4.5  CL 99 100 105 105  CO2 27 27 24 26   GLUCOSE 190* 162* 231* 200*  BUN 32* 24* 21 25*  CREATININE 1.00 0.93 0.91 1.11  CALCIUM 8.8* 8.6* 8.4* 8.9  MG  --   --  2.1  --   PHOS  --   --  2.9  --    GFR: Estimated Creatinine Clearance: 61.4 mL/min (by C-G formula based on SCr of 1.11 mg/dL). Liver Function Tests: Recent Labs  Lab 10/05/23 0636  AST 17  ALT 17  ALKPHOS 60  BILITOT 0.7  PROT 7.0  ALBUMIN 3.8  No results for input(s): "LIPASE", "AMYLASE" in the last 168 hours. No results for input(s): "AMMONIA" in the last 168 hours. Coagulation Profile: No results for input(s): "INR", "PROTIME" in the last 168 hours. Cardiac Enzymes: No results for input(s): "CKTOTAL", "CKMB", "CKMBINDEX", "TROPONINI" in the last 168 hours. BNP (last 3 results) No results for input(s): "PROBNP" in the last 8760 hours. HbA1C: No results for input(s): "HGBA1C" in the last 72 hours. CBG: Recent Labs  Lab 10/08/23 1207 10/08/23 1534 10/08/23 2125 10/09/23 0533 10/09/23 1049  GLUCAP 175* 165* 233* 183* 172*   Lipid Profile: No results for input(s): "CHOL", "HDL", "LDLCALC", "TRIG", "CHOLHDL", "LDLDIRECT" in the last 72 hours. Thyroid Function Tests: No results for input(s): "TSH", "T4TOTAL", "FREET4", "T3FREE", "THYROIDAB" in the last 72 hours. Anemia Panel: Recent Labs    10/07/23 1506  FERRITIN 26  TIBC 420  IRON 432*   Sepsis Labs: No results for input(s): "PROCALCITON", "LATICACIDVEN" in the last 168 hours.  Recent Results (from the past 240 hour(s))  MRSA Next Gen by PCR, Nasal     Status: None   Collection Time: 10/05/23  2:34 PM   Specimen: Nasal Mucosa; Nasal Swab  Result Value Ref Range Status   MRSA by PCR Next Gen NOT DETECTED NOT DETECTED Final    Comment: (NOTE) The GeneXpert MRSA Assay (FDA approved for NASAL specimens only), is one  component of a comprehensive MRSA colonization surveillance program. It is not intended to diagnose MRSA infection nor to guide or monitor treatment for MRSA infections. Test performance is not FDA approved in patients less than 76 years old. Performed at [facility], [address] [place], [place] [address]     Radiology Studies: No results found.  Scheduled Meds:  sodium chloride   Intravenous Once   sodium chloride   Intravenous Once   aspirin EC  81 mg Oral Daily   atorvastatin  80 mg Oral Daily   budesonide  0.5 mg Nebulization BID   dicyclomine  10 mg Oral TID AC   feeding supplement  237 mL Oral BID BM   fluticasone  1 spray Each Nare Daily   furosemide  40 mg Oral Daily   insulin aspart  0-5 Units Subcutaneous QHS   insulin aspart  0-9 Units Subcutaneous TID WC   ketoconazole  1 Application Topical Daily   levothyroxine  88 mcg Oral QAC breakfast   loratadine  10 mg Oral Daily   pantoprazole (PROTONIX) IV  40 mg Intravenous Q12H   potassium chloride  20 mEq Oral Daily   revefenacin  175 mcg Nebulization Daily   Continuous Infusions:   LOS: 4 days    Time spent: 50 mins    Willeen Niece, MD [facility]   If 7PM-7AM, please contact night-coverage

## 2023-10-09 NOTE — Telephone Encounter (Signed)
Patient called stated that he was seen by Dr. Lavon Paganini at the ED and would like to know if he could have a follow up appointment with her. Please advise

## 2023-10-09 NOTE — Progress Notes (Signed)
Phlebotomist came for type and screen at 11am, per patient's request to come back later, she was unable to collect at 11am and then was delayed for awhile before she returned around 14:30 this afternoon---I am still waiting on blood to be ready by blood bank at this time, so patient's PRBC has been delayed due to the above mentioned reasons

## 2023-10-09 NOTE — Plan of Care (Signed)
  Problem: Education: Goal: Knowledge of General Education information will improve Description: Including pain rating scale, medication(s)/side effects and non-pharmacologic comfort measures Outcome: Progressing   Problem: Health Behavior/Discharge Planning: Goal: Ability to manage health-related needs will improve Outcome: Progressing   Problem: Clinical Measurements: Goal: Ability to maintain clinical measurements within normal limits will improve Outcome: Progressing Goal: Will remain free from infection Outcome: Progressing Goal: Diagnostic test results will improve Outcome: Progressing Goal: Respiratory complications will improve Outcome: Progressing Goal: Cardiovascular complication will be avoided Outcome: Progressing   Problem: Nutrition: Goal: Adequate nutrition will be maintained Outcome: Progressing   Problem: Coping: Goal: Level of anxiety will decrease Outcome: Progressing   Problem: Elimination: Goal: Will not experience complications related to bowel motility Outcome: Progressing Goal: Will not experience complications related to urinary retention Outcome: Progressing   Problem: Pain Management: Goal: General experience of comfort will improve Outcome: Progressing   Problem: Safety: Goal: Ability to remain free from injury will improve Outcome: Progressing   Problem: Skin Integrity: Goal: Risk for impaired skin integrity will decrease Outcome: Progressing   Problem: Education: Goal: Ability to describe self-care measures that may prevent or decrease complications (Diabetes Survival Skills Education) will improve Outcome: Progressing Goal: Individualized Educational Video(s) Outcome: Progressing   Problem: Coping: Goal: Ability to adjust to condition or change in health will improve Outcome: Progressing   Problem: Fluid Volume: Goal: Ability to maintain a balanced intake and output will improve Outcome: Progressing   Problem: Health  Behavior/Discharge Planning: Goal: Ability to identify and utilize available resources and services will improve Outcome: Progressing Goal: Ability to manage health-related needs will improve Outcome: Progressing   Problem: Metabolic: Goal: Ability to maintain appropriate glucose levels will improve Outcome: Progressing   Problem: Nutritional: Goal: Maintenance of adequate nutrition will improve Outcome: Progressing Goal: Progress toward achieving an optimal weight will improve Outcome: Progressing   Problem: Skin Integrity: Goal: Risk for impaired skin integrity will decrease Outcome: Progressing   Problem: Tissue Perfusion: Goal: Adequacy of tissue perfusion will improve Outcome: Progressing

## 2023-10-10 DIAGNOSIS — K922 Gastrointestinal hemorrhage, unspecified: Secondary | ICD-10-CM | POA: Diagnosis not present

## 2023-10-10 LAB — GLUCOSE, CAPILLARY
Glucose-Capillary: 176 mg/dL — ABNORMAL HIGH (ref 70–99)
Glucose-Capillary: 185 mg/dL — ABNORMAL HIGH (ref 70–99)

## 2023-10-10 LAB — BASIC METABOLIC PANEL
Anion gap: 10 (ref 5–15)
BUN: 28 mg/dL — ABNORMAL HIGH (ref 8–23)
CO2: 20 mmol/L — ABNORMAL LOW (ref 22–32)
Calcium: 8.8 mg/dL — ABNORMAL LOW (ref 8.9–10.3)
Chloride: 108 mmol/L (ref 98–111)
Creatinine, Ser: 0.99 mg/dL (ref 0.61–1.24)
GFR, Estimated: >60 mL/min (ref >60)
Glucose, Bld: 187 mg/dL — ABNORMAL HIGH (ref 70–99)
Potassium: 4.5 mmol/L (ref 3.5–5.1)
Sodium: 138 mmol/L (ref 135–145)

## 2023-10-10 LAB — CBC
HCT: 29.1 % — ABNORMAL LOW (ref 39.0–52.0)
Hemoglobin: 9.5 g/dL — ABNORMAL LOW (ref 13.0–17.0)
MCH: 31.5 pg (ref 26.0–34.0)
MCHC: 32.6 g/dL (ref 30.0–36.0)
MCV: 96.4 fL (ref 80.0–100.0)
Platelets: 175 10*3/uL (ref 150–400)
RBC: 3.02 MIL/uL — ABNORMAL LOW (ref 4.22–5.81)
RDW: 21.2 % — ABNORMAL HIGH (ref 11.5–15.5)
WBC: 6.2 10*3/uL (ref 4.0–10.5)
nRBC: 0.3 % — ABNORMAL HIGH (ref 0.0–0.2)

## 2023-10-10 NOTE — Plan of Care (Signed)

## 2023-10-10 NOTE — Discharge Summary (Signed)
Physician Discharge Summary  Jeremiah [name] WUJ:[id number] DOB: [birth year] DOA: 10/05/2023  PCP: [name], MD  Admit date: 10/05/2023  Discharge date: 10/10/2023  Admitted From: Home. Disposition:  Home.  Recommendations for Outpatient Follow-up:  Follow up with PCP in 1-2 weeks. Please obtain BMP/CBC in one week. Follow-up with gastroenterology for resumption of Eliquis. Advised to follow-up with gastroenterology as scheduled.  Home Health: None Equipment/Devices:None  Discharge Condition: Stable CODE STATUS:Full code Diet recommendation: Heart Healthy   Brief [facility] Course: This 77 yrs old male with PMH significant for HTN, HLD, T2DM, CAD s/p CABG, HF, COPD on nighttime 4 L, Afib on Eliquis, ischemic colitis s/p SMA stenting on DAPT and recurrent GI bleeds who presented to ED for evaluation of black tarry stools and shortness of breath and admitted for GI bleed.  GI was consulted.  CTA without active GI or mesenteric bleed. Patient did have extensive workup with EGD colonoscopy and capsule endoscopy in July 2024.  He has history of suspected diverticular bleed as well as imaging suggestive of bleeding at the branch of SMA.  Patient is taking aspirin at home. GI recommended no indication for repeat endoscopy at this time.  Patient is status post 3 units PRBC.  Hemoglobin has improved to 9.4.  GI signed off.  Patient is being discharged home,  advised to follow-up with gastroenterology for resumption of Eliquis.  Patient is being discharged home.   Discharge Diagnoses:  Principal Problem:   Acute GI bleeding Active Problems:   Exertional shortness of breath  Symptomatic anemia / Acute GI bleed: Hx iron deficiency anemia: Patient with hx. of recurrent GI bleed presented for the evaluation of ongoing black tarry stools with associated fatigue and lightheadedness. His Hb down to 6.3, from 12.5 three weeks ago.  He has had extensive workup with EGD, colonoscopy and capsule  endoscop (see care everywhere capsule endoscopy study from 06/2023). He has history of suspected diverticular bleeds as well as imaging suggestive of bleeding at branch of SMA (04/2023 - he declined IR embolization at that time).  He is taking aspirin at home. S/p capsule endoscopy on June 12, 2023 with fresh blood in distal ileum without identifiable lesion.   GI recommended repeat ileocolonoscopy vs balloon endoscopy at that time.  He's been told he needs to follow at [facility].  CTA without active GI or mesenteric bleed. GI consulted, appreciate recs - they've signed off - s/p IV iron - defer repeat endoscopy at this point.   He'll need frequent monitoring of CBC/iron studies outpatient.  Dr. Lavon Paganini notes insurance won't cover octreotide.  F/u posttransfusion H&H -> S/p 3 units pRBC.  Hb fluctuating.   Continue to monitor H/H. He notes dark stools and blood when wiping.   Hemoglobin has improved.  Patient denies further episodes of blood in the stools.   Atrial Fibrillation: Patient has hx. of complete heart block status post pacemaker implanted about 2 months ago.   Not on anticoagulation due to recurrent bleeds. EKG shows ventricularly paced rhythm.  He reports ongoing fatigue and lightheadedness but denies any palpitations. EKG today with sinus rhythm. Continue Telemetry Continue to Trend and replete electrolytes. Follow-up gastroenterology for resumption of Eliquis.   Diastolic heart failure: Last TTE on 06/2023 showed LVEF 55 to 60%, moderate asymmetric LVH, G1 DD, severely dilated LA, and moderate mitral stenosis.  Patient with progressive dyspnea on exertion over the last week however he is euvolemic on exam. Continue home Lasix 40 mg daily Continue KCl 20 mEq  daily Strict I&O's, daily weights   Chronic hypoxic respiratory failure due to COPD Patient has o2 at home (4 L at night), currently on RA Continue on supplemental O2 Continue home nebs.   CAD s/p CABG Continue ASA 81 mg  daily. Continue Atorvastatin 80 mg daily.   Essential HTN: BP fluctuating, mostly on the low normal side Continue lasix, Spironolactone.   Diabetes Mellitus II: HbA1c down to 5.9% 1 month ago (this may underestimate A1c with his anemia, frequent transfusions) SSI   Hypothyroidism: Continue Synthroid 88 mcg daily   Hx Ischemic Colitis s/p Celiac and SMA Stenting (03/2023) Continue Aspirin (vascular 04/2023 noted aspirin alone adequate at that time).   Peripheral vascular disease Patient reports ongoing claudication that is being worked out by vascular surgery in the outpatient.   Follow with vascular outpatient.    Discharge Instructions  Discharge Instructions     Amb Referral to Intravenous Iron Therapy   Complete by: As directed    You have been referred to [facility] Infusion team for IV Iron Infusions. The infusion pharmacy team will reach out to you with appointment information.    Primary Diagnosis Code for IV Iron: D50.9 - Iron deficiency Anemia   Secondary diagnosis code for IV iron: Other   Comment: recurrent GIB with AVMs   Call MD for:  difficulty breathing, headache or visual disturbances   Complete by: As directed    Call MD for:  persistant dizziness or light-headedness   Complete by: As directed    Call MD for:  persistant nausea and vomiting   Complete by: As directed    Diet - low sodium heart healthy   Complete by: As directed    Diet Carb Modified   Complete by: As directed    Discharge instructions   Complete by: As directed    Advised to follow-up with primary care physician in 1 week. Advised to follow-up with gastroenterology as scheduled.   Increase activity slowly   Complete by: As directed       Allergies as of 10/10/2023       Reactions   Shellfish Allergy Shortness Of Breath, Nausea And Vomiting, Rash   Scallops   Fluzone [influenza Virus Vaccine] Nausea And Vomiting, Palpitations, Rash        Medication List     TAKE these  medications    acetaminophen 650 MG CR tablet Commonly known as: TYLENOL Take 1,300 mg by mouth every 8 (eight) hours as needed for pain.   aspirin EC 81 MG tablet Take 1 tablet (81 mg total) by mouth daily. Swallow whole.   atorvastatin 80 MG tablet Commonly known as: LIPITOR Take 80 mg by mouth daily.   budesonide 0.5 MG/2ML nebulizer solution Commonly known as: Pulmicort Take 2 mLs (0.5 mg total) by nebulization 2 (two) times daily.   cetirizine 10 MG tablet Commonly known as: ZYRTEC Take 10 mg by mouth daily.   colchicine 0.6 MG tablet Take 0.6 mg by mouth daily as needed (Gout).   dicyclomine 10 MG capsule Commonly known as: BENTYL Take 10 mg by mouth 3 (three) times daily before meals. 30 minutes before eating   FeroSul 325 (65 Fe) MG tablet Generic drug: ferrous sulfate TAKE 1 TABLET BY MOUTH DAILY WITH BREAKFAST   fluticasone 50 MCG/ACT nasal spray Commonly known as: FLONASE Place 1 spray into both nostrils daily.   furosemide 40 MG tablet Commonly known as: LASIX Take 2 tablets (80 mg total) by mouth daily. What  changed: how much to take   ketoconazole 2 % shampoo Commonly known as: NIZORAL Apply 1 Application topically daily.   levothyroxine 88 MCG tablet Commonly known as: SYNTHROID Take 88 mcg by mouth daily before breakfast. Take on an empty stomach   Potassium Chloride ER 20 MEQ Tbcr Take 1 tablet by mouth daily.   revefenacin 175 MCG/3ML nebulizer solution Commonly known as: YUPELRI Take 175 mcg by nebulization in the morning and at bedtime.   spironolactone 25 MG tablet Commonly known as: ALDACTONE Take 1 tablet (25 mg total) by mouth daily.        Follow-up Information     Koirala, Dibas, MD Follow up in 5 day(s).   Specialty: Family Medicine Why: Nov. 11,2024 @11 :30AM Contact information: [address] [place] [address] [phone]         Jeani Hawking, MD Follow up in 1 week(s).   Specialty:  Gastroenterology Why: Follow-up with GI for resumption of anticoagulation. Contact information: [address] [place] [address] [phone]                Allergies  Allergen Reactions   Shellfish Allergy Shortness Of Breath, Nausea And Vomiting and Rash    Scallops     Fluzone [Influenza Virus Vaccine] Nausea And Vomiting, Palpitations and Rash    Consultations: Gastroenterology   Procedures/Studies: CT ANGIO GI BLEED  Result Date: 10/05/2023 CLINICAL DATA:  GI bleed, Hx of mesenteric bleed EXAM: CTA ABDOMEN AND PELVIS WITHOUT AND WITH CONTRAST TECHNIQUE: Multidetector CT imaging of the abdomen and pelvis was performed using the standard protocol during bolus administration of intravenous contrast. Multiplanar reconstructed images and MIPs were obtained and reviewed to evaluate the vascular anatomy. RADIATION DOSE REDUCTION: This exam was performed according to the departmental dose-optimization program which includes automated exposure control, adjustment of the mA and/or kV according to patient size and/or use of iterative reconstruction technique. CONTRAST:  OMNIPAQUE IOHEXOL 350 MG/ML SOLN COMPARISON:  CT abdomen pelvis 02/05/2023 FINDINGS: VASCULAR No active extravasation of intravenous contrast to suggest active hemorrhage. Aorta: Severe atherosclerotic plaque. Normal caliber aorta without aneurysm, dissection, vasculitis or significant stenosis. Celiac: Celiac origin stent patent. Patent without evidence of aneurysm, dissection, vasculitis or significant stenosis. SMA: SMA origin stent patent but severely narrowed (11:60). Patent without evidence of aneurysm, dissection, vasculitis or significant stenosis. Renals: Both renal arteries are patent without evidence of aneurysm, dissection, vasculitis, fibromuscular dysplasia or significant stenosis. IMA: Patent without evidence of aneurysm, dissection, vasculitis or significant stenosis. Inflow: Severe  atherosclerotic plaque. Patent without evidence of aneurysm, dissection, vasculitis or significant stenosis. Proximal Outflow: Bilateral common femoral and visualized portions of the superficial and profunda femoral arteries are patent without evidence of aneurysm, dissection, vasculitis or significant stenosis. Veins: No obvious venous abnormality within the limitations of this arterial phase study. Review of the MIP images confirms the above findings. NON-VASCULAR Lower chest: Partially visualized cardiac leads. Mitral annular calcification. No acute abnormality. Hepatobiliary: No focal liver abnormality. No gallstones, gallbladder wall thickening, or pericholecystic fluid. No biliary dilatation. Pancreas: No focal lesion. Normal pancreatic contour. No surrounding inflammatory changes. No main pancreatic ductal dilatation. Spleen: Normal in size without focal abnormality. Adrenals/Urinary Tract: No adrenal nodule bilaterally. Bilateral kidneys enhance symmetrically. No hydronephrosis. No hydroureter. The urinary bladder is unremarkable. Stomach/Bowel: 5 mm metallic density within the gastric lumen of unclear etiology. Stomach is within normal limits. No evidence of bowel wall thickening or dilatation. Colonic diverticulosis. Appendix appears normal. Lymphatic: No lymphadenopathy.  Reproductive: Prostate is unremarkable. Other: No intraperitoneal free fluid. No intraperitoneal free gas. No organized fluid collection. Musculoskeletal: No abdominal wall hernia or abnormality. No suspicious lytic or blastic osseous lesions. No acute displaced fracture. Multilevel degenerative changes of the spine. IMPRESSION: VASCULAR 1. No active gastrointestinal or mesenteric bleed. 2. Aortic Atherosclerosis (ICD10-I70.0) -severe with a patent origin celiac artery stent and severely stenosed but patent origin SMA stent. NON-VASCULAR 1. A 5 mm metallic density within the gastric lumen of unclear etiology. Correlate with possible  history of ingested foreign body or prior gastric procedure. Electronically Signed   By: Tish Frederickson M.D.   On: 10/05/2023 20:36   DG Chest 2 View  Result Date: 10/05/2023 CLINICAL DATA:  Shortness of breath. EXAM: CHEST - 2 VIEW COMPARISON:  10/02/2023 FINDINGS: The lungs are clear without focal pneumonia, edema, pneumothorax or pleural effusion. Interstitial markings are diffusely coarsened with chronic features. Cardiopericardial silhouette is at upper limits of normal for size. Left permanent pacemaker again noted. No acute bony abnormality. IMPRESSION: Chronic interstitial coarsening without acute cardiopulmonary findings. Electronically Signed   By: Kennith Center M.D.   On: 10/05/2023 07:08   DG Chest 2 View  Result Date: 10/02/2023 CLINICAL DATA:  Shortness of breath EXAM: CHEST - 2 VIEW COMPARISON:  Chest x-ray 08/24/2023 FINDINGS: Median sternotomy wires and left-sided pacemaker are again seen. There is no focal lung infiltrate, pleural effusion or pneumothorax. No acute fractures are seen. IMPRESSION: No active cardiopulmonary disease. Electronically Signed   By: Darliss Cheney M.D.   On: 10/02/2023 17:03     Subjective: Patient was seen and examined at bedside.  Overnight events noted.   Patient reports doing much better.  Denies any further bleeding.   Hemoglobin has improved to 9.4. Patient wants to be discharged.   Patient is being discharged home.  Discharge Exam: Vitals:   10/10/23 1042 10/10/23 1403  BP: (!) 92/52 (!) 108/47  Pulse:    Resp: 14   Temp: 98.6 F (37 C)   SpO2: 96%    Vitals:   10/10/23 0824 10/10/23 0827 10/10/23 1042 10/10/23 1403  BP:   (!) 92/52 (!) 108/47  Pulse: 99     Resp: 17  14   Temp:   98.6 F (37 C)   TempSrc:   Oral   SpO2: 95% 95% 96%   Weight:      Height:        General: Pt is alert, awake, not in acute distress Cardiovascular: RRR, S1/S2 +, no rubs, no gallops Respiratory: CTA bilaterally, no wheezing, no  rhonchi Abdominal: Soft, NT, ND, bowel sounds + Extremities: no edema, no cyanosis    The results of significant diagnostics from this hospitalization (including imaging, microbiology, ancillary and laboratory) are listed below for reference.     Microbiology: Recent Results (from the past 240 hour(s))  MRSA Next Gen by PCR, Nasal     Status: None   Collection Time: 10/05/23  2:34 PM   Specimen: Nasal Mucosa; Nasal Swab  Result Value Ref Range Status   MRSA by PCR Next Gen NOT DETECTED NOT DETECTED Final    Comment: (NOTE) The GeneXpert MRSA Assay (FDA approved for NASAL specimens only), is one component of a comprehensive MRSA colonization surveillance program. It is not intended to diagnose MRSA infection nor to guide or monitor treatment for MRSA infections. Test performance is not FDA approved in patients less than 54 years old. Performed at [facility], [address],  [place], [place] [address]      Labs: BNP (last 3 results) Recent Labs    06/05/23 1530 08/22/23 0607 10/05/23 0807  BNP 273.8* 898.5* 142.7*   Basic Metabolic Panel: Recent Labs  Lab 10/05/23 0636 10/06/23 0647 10/08/23 0222 10/09/23 0223 10/10/23 1014  NA 134* 137 135 138 138  K 3.6 3.8 3.6 4.5 4.5  CL 99 100 105 105 108  CO2 27 27 24 26  20*  GLUCOSE 190* 162* 231* 200* 187*  BUN 32* 24* 21 25* 28*  CREATININE 1.00 0.93 0.91 1.11 0.99  CALCIUM 8.8* 8.6* 8.4* 8.9 8.8*  MG  --   --  2.1  --   --   PHOS  --   --  2.9  --   --    Liver Function Tests: Recent Labs  Lab 10/05/23 0636  AST 17  ALT 17  ALKPHOS 60  BILITOT 0.7  PROT 7.0  ALBUMIN 3.8   No results for input(s): "LIPASE", "AMYLASE" in the last 168 hours. No results for input(s): "AMMONIA" in the last 168 hours. CBC: Recent Labs  Lab 10/05/23 0636 10/05/23 1801 10/06/23 0647 10/06/23 1115 10/08/23 0222 10/08/23 1423 10/09/23 0223 10/09/23 2222 10/10/23 1014  WBC 5.3  --  5.1  --  5.1  --  6.1  --  6.2   NEUTROABS  --   --  3.2  --   --   --   --   --   --   HGB 6.6*   < > 7.9*   < > 7.8* 8.6* 7.4* 8.8* 9.5*  HCT 20.9*   < > 23.9*   < > 24.5* 26.5* 23.9* 27.4* 29.1*  MCV 97.7  --  94.8  --  96.8  --  98.0  --  96.4  PLT 213  --  177  --  174  --  179  --  175   < > = values in this interval not displayed.   Cardiac Enzymes: No results for input(s): "CKTOTAL", "CKMB", "CKMBINDEX", "TROPONINI" in the last 168 hours. BNP: Invalid input(s): "POCBNP" CBG: Recent Labs  Lab 10/09/23 1049 10/09/23 1602 10/09/23 2148 10/10/23 0625 10/10/23 1055  GLUCAP 172* 186* 193* 176* 185*   D-Dimer No results for input(s): "DDIMER" in the last 72 hours. Hgb A1c No results for input(s): "HGBA1C" in the last 72 hours. Lipid Profile No results for input(s): "CHOL", "HDL", "LDLCALC", "TRIG", "CHOLHDL", "LDLDIRECT" in the last 72 hours. Thyroid function studies No results for input(s): "TSH", "T4TOTAL", "T3FREE", "THYROIDAB" in the last 72 hours.  Invalid input(s): "FREET3" Anemia work up Recent Labs    10/07/23 1506  FERRITIN 26  TIBC 420  IRON 432*   Urinalysis    Component Value Date/Time   COLORURINE YELLOW 02/05/2023 2342   APPEARANCEUR CLEAR 02/05/2023 2342   LABSPEC 1.043 (H) 02/05/2023 2342   PHURINE 6.0 02/05/2023 2342   GLUCOSEU >=500 (A) 02/05/2023 2342   HGBUR NEGATIVE 02/05/2023 2342   BILIRUBINUR NEGATIVE 02/05/2023 2342   KETONESUR 20 (A) 02/05/2023 2342   PROTEINUR NEGATIVE 02/05/2023 2342   NITRITE NEGATIVE 02/05/2023 2342   LEUKOCYTESUR NEGATIVE 02/05/2023 2342   Sepsis Labs Recent Labs  Lab 10/06/23 0647 10/08/23 0222 10/09/23 0223 10/10/23 1014  WBC 5.1 5.1 6.1 6.2   Microbiology Recent Results (from the past 240 hour(s))  MRSA Next Gen by PCR, Nasal     Status: None   Collection Time: 10/05/23  2:34 PM   Specimen: Nasal Mucosa; Nasal  Swab  Result Value Ref Range Status   MRSA by PCR Next Gen NOT DETECTED NOT DETECTED Final    Comment: (NOTE) The  GeneXpert MRSA Assay (FDA approved for NASAL specimens only), is one component of a comprehensive MRSA colonization surveillance program. It is not intended to diagnose MRSA infection nor to guide or monitor treatment for MRSA infections. Test performance is not FDA approved in patients less than 54 years old. Performed at [facility], [address] [place], [place] [address]      Time coordinating discharge: Over 30 minutes  SIGNED:   Willeen Niece, MD  [facility] 10/10/2023, 2:29 PM Pager   If 7PM-7AM, please contact night-coverage

## 2023-10-10 NOTE — Progress Notes (Signed)
Patient post transfusion H/H completed at 2223.   No orders for morning labs.  Notified night coverage.  Orders placed for morning labs to be drawn after 5am.   0630-call placed to phlebotomy about lab draw.  Stated they will send someone as soon as they can.

## 2023-10-10 NOTE — Discharge Instructions (Addendum)
Advised to follow-up with primary care physician in 1 week. Advised to follow-up with gastroenterology as scheduled. Follow-up with gastroenterology for resumption of Eliquis.

## 2023-10-10 NOTE — TOC Transition Note (Signed)
Transition of Care [facility]) - CM/SW Discharge Note   Patient Details  Name: Jeremiah [name] MRN: [id number] Date of Birth: 12/30/1945  Transition of Care [facility]) CM/SW Contact:  Harriet Masson, RN Phone Number: 10/10/2023, 1:53 PM   Clinical Narrative:    Patient stable for discharge.  Patient's wife will transport home.  Follow up apts on AVS. No other TOC needs at this time.   Final next level of care: Home/Self Care Barriers to Discharge: Barriers Resolved   Patient Goals and CMS Choice    Return home  Discharge Placement               home          Discharge Plan and Services Additional resources added to the After Visit Summary for                                       Social Determinants of Health (SDOH) Interventions SDOH Screenings   Food Insecurity: No Food Insecurity (10/05/2023)  Housing: Low Risk  (10/05/2023)  Transportation Needs: No Transportation Needs (10/05/2023)  Utilities: Not At Risk (10/05/2023)  Tobacco Use: Medium Risk (10/05/2023)     Readmission Risk Interventions    10/10/2023    1:52 PM  Readmission Risk Prevention Plan  Transportation Screening Complete  Medication Review (RN Care Manager) Complete  PCP or Specialist appointment within 3-5 days of discharge Complete  HRI or Home Care Consult Complete  SW Recovery Care/Counseling Consult Complete  Palliative Care Screening Complete  Skilled Nursing Facility Not Applicable

## 2023-10-12 LAB — TYPE AND SCREEN
ABO/RH(D): O POS
Antibody Screen: POSITIVE
Donor AG Type: NEGATIVE
Unit division: 0
Unit division: 0

## 2023-10-12 LAB — BPAM RBC
Blood Product Expiration Date: 202412022359
Blood Product Expiration Date: 202412022359
ISSUE DATE / TIME: 202411061707
Unit Type and Rh: 5100
Unit Type and Rh: 5100

## 2023-10-14 ENCOUNTER — Encounter: Payer: Self-pay | Admitting: Pulmonary Disease

## 2023-10-14 ENCOUNTER — Telehealth: Payer: Self-pay | Admitting: Internal Medicine

## 2023-10-14 DIAGNOSIS — E1165 Type 2 diabetes mellitus with hyperglycemia: Secondary | ICD-10-CM | POA: Diagnosis not present

## 2023-10-14 DIAGNOSIS — I739 Peripheral vascular disease, unspecified: Secondary | ICD-10-CM | POA: Diagnosis not present

## 2023-10-14 DIAGNOSIS — I4891 Unspecified atrial fibrillation: Secondary | ICD-10-CM | POA: Diagnosis not present

## 2023-10-14 DIAGNOSIS — J449 Chronic obstructive pulmonary disease, unspecified: Secondary | ICD-10-CM | POA: Diagnosis not present

## 2023-10-14 DIAGNOSIS — D500 Iron deficiency anemia secondary to blood loss (chronic): Secondary | ICD-10-CM | POA: Diagnosis not present

## 2023-10-14 NOTE — Telephone Encounter (Signed)
Left patient a message in regards to scheduled appointment times/dates per scheduling message

## 2023-10-14 NOTE — Telephone Encounter (Signed)
Called and spoke with sister Cordelia Pen who will have patient call schedule appt.

## 2023-10-15 ENCOUNTER — Telehealth: Payer: Self-pay

## 2023-10-15 ENCOUNTER — Telehealth: Payer: Self-pay | Admitting: Medical Oncology

## 2023-10-15 ENCOUNTER — Other Ambulatory Visit: Payer: Federal, State, Local not specified - PPO

## 2023-10-15 NOTE — Telephone Encounter (Addendum)
HGB done yesterday @ PCP and hgb was 8.6. I cancelled today's lab. 11/18 Iron infusion    His lab order >  q 2 weeks-next is 11/26 .

## 2023-10-15 NOTE — Telephone Encounter (Signed)
Spoke with pt about upcoming appts.   Iron infusion at [facility] 11/18 Lab appt at [facility] 11/26  Informed pt to give [place] a call if he feels like he is weak and needs labs rechecked at anytime.  Pt verbalized understanding.

## 2023-10-18 ENCOUNTER — Ambulatory Visit: Payer: Federal, State, Local not specified - PPO | Attending: Internal Medicine | Admitting: Internal Medicine

## 2023-10-18 ENCOUNTER — Encounter: Payer: Self-pay | Admitting: Internal Medicine

## 2023-10-18 VITALS — BP 126/64 | HR 100 | Ht 68.0 in | Wt 200.4 lb

## 2023-10-18 DIAGNOSIS — E1169 Type 2 diabetes mellitus with other specified complication: Secondary | ICD-10-CM

## 2023-10-18 DIAGNOSIS — I442 Atrioventricular block, complete: Secondary | ICD-10-CM

## 2023-10-18 DIAGNOSIS — E785 Hyperlipidemia, unspecified: Secondary | ICD-10-CM

## 2023-10-18 DIAGNOSIS — Z951 Presence of aortocoronary bypass graft: Secondary | ICD-10-CM

## 2023-10-18 DIAGNOSIS — I152 Hypertension secondary to endocrine disorders: Secondary | ICD-10-CM

## 2023-10-18 DIAGNOSIS — K922 Gastrointestinal hemorrhage, unspecified: Secondary | ICD-10-CM

## 2023-10-18 DIAGNOSIS — I342 Nonrheumatic mitral (valve) stenosis: Secondary | ICD-10-CM

## 2023-10-18 DIAGNOSIS — I700 Atherosclerosis of aorta: Secondary | ICD-10-CM

## 2023-10-18 DIAGNOSIS — I480 Paroxysmal atrial fibrillation: Secondary | ICD-10-CM

## 2023-10-18 DIAGNOSIS — E1159 Type 2 diabetes mellitus with other circulatory complications: Secondary | ICD-10-CM

## 2023-10-18 NOTE — Progress Notes (Signed)
Cardiology Office Note:   Date:  10/18/2023  ID:  Jeremiah [name], DOB 07-07-1946, MRN [id number] PCP:  [name], MD  [facility] Providers Cardiologist:  [name], MD Referring MD: [name], MD  Chief Complaint/Reason for Referral:  Cardiology follow up ASSESSMENT:    1. Nonrheumatic mitral valve stenosis   2. S/P CABG x 3   3. Hypertension associated with diabetes (HCC)   4. Hyperlipidemia associated with type 2 diabetes mellitus (HCC)   5. PAF (paroxysmal atrial fibrillation) (HCC)   6. Gastrointestinal hemorrhage, unspecified gastrointestinal hemorrhage type   7. Complete heart block (HCC)   8. Aortic atherosclerosis (HCC)     PLAN:   In order of problems listed above: Calcific mitral stenosis: The patient has calcific mitral stenosis.  This does not seem to be affecting him right now.  An echocardiogram last year demonstrated preserved RV function.  I think the patient is limited by claudication and anemia.  I think if these 2 things were addressed he may feel more short of breath attributable to his mitral valve stenosis.  I had previously reviewed his imaging studies.  He has a very calcified mitral annulus that is not amenable to surgical intervention.  I think if his peripheral vascular disease and anemia with GI bleeding were addressed he may be more symptomatic.  Until that time I think we should just monitor him.  If he becomes more symptomatic I would obtain a TAVR protocol CTA to evaluate further in regards to a potential transcatheter approach.  This is currently off label.  I will see him back in 6 months with an echocardiogram in 3 months to evaluate his MS and RV function. Status post CABG: No significant angina; continue current medical therapy. Hypertension: Well-controlled today. Hyperlipidemia: Continue atorvastatin. Paroxysmal atrial fibrillation: Continue aspirin 81 mg; not a candidate for Watchman procedure.  Off Eliquis. GI bleeding: No recurrence  recently.  If he rebleeds he would need to be transferred to [facility] for advanced endoscopy. Complete heart block: Followed by EP service.           Dispo:  No follow-ups on file.      Medication Adjustments/Labs and Tests Ordered: Current medicines are reviewed at length with the patient today.  Concerns regarding medicines are outlined above.  The following changes have been made:  no change   Labs/tests ordered: Orders Placed This Encounter  Procedures   ECHOCARDIOGRAM COMPLETE    Medication Changes: No orders of the defined types were placed in this encounter.   Current medicines are reviewed at length with the patient today.  The patient does not have concerns regarding medicines.  I spent 60 minutes reviewing all clinical data during and prior to this visit including all relevant imaging studies, laboratories, clinical information from other health systems, and prior notes from both Cardiology and other specialties, interviewing the patient, and conducting a complete physical examination in order to formulate a comprehensive and personalized evaluation and treatment plan.  History of Present Illness:      FOCUSED PROBLEM LIST:   Mitral stenosis Severe MAC Mean gradient TTE July 2024 Aortic stenosis MG TTE 2024 Coronary artery disease CABG LIMA to LAD, VG to OM, VG to PDA 2006 Type 2 diabetes mellitus Not on insulin Paroxysmal atrial fibrillation C2V 4 Not a candidate for LAAO Iron deficiency anemia History of GI bleed Mesenteric ischemia s/p celiac and mesenteric artery stenting 2024 Not a candidate for LAAO COPD on supplemental O2 FEV1 1.27  2020 Peripheral arterial disease Followed by Dr. Allyson Sabal Complete heart block S/p PPM 2024 BMI 31 Aortic atherosclerosis CT abdomen pelvis 2020   May 2024:  The patient is a 77 y.o. male with the indicated medical history here for recommendations regarding his severe mitral stenosis.  The patient saw Dr.  Rennis [name] recently.  He had been hospitalized a few times at [facility].  He developed GI bleeding and was found to have developed ischemic colitis; he underwent mesenteric artery and celiac artery stenting.  Apparently he required 8 units of packed red blood cells.  He ultimately underwent a TEE with a mitral valve area, 1.3-1.7.  The mean gradient was 5.8 mmHg and this was thought to be suggestive of severe calcific mitral stenosis due to heavily calcified mitral valve by appearance.  The aortic valve area with planimetry to be 1.2 cm and the valve was severely calcified with restricted mobility; his Hgb was around 10 at this time.  He had a transthoracic echocardiogram on May 11 which demonstrated upper septal hypertrophy with a sigmoid septum, and ejection fraction was 65 to 70%, and a mean gradient across the mitral valve of 6.5 mmHg with a heart rate of 76 bpm (the patient's Hgb was ~5 at this time).  There was severe mitral annular calcification and moderate mitral regurgitation seen.  Interestingly Jeremiah [name] had obtained an echocardiogram in February which demonstrated a mean mitral gradient of 10 mmHg.   The patient tells me that he is feeling much better.  He had seen Jeremiah [name] a few weeks ago between his bleeding episodes.  Following Jeremiah [name] he was noted to be severely anemic with a hemoglobin of 5.  He required multiple transfusions and underwent colonoscopy which showed no overt bleeding source.  He was discharged a few days ago.  His hemoglobin was 9.1 on the day of discharge.  He feels well.  He denies any shortness of breath, chest pain, or presyncope since discharge.  He has not resumed any of his medications just yet.  He has had no issues with recurrent lower GI bleeding.  Plan: Obtain CBC, refer for left atrial appendage occlusion opinion, obtain echocardiogram.  November 2024: In the interim the patient was seen by Jeremiah [name] and not thought to be candidate for left atrial  appendage occlusion.  Echocardiogram demonstrated preserved RV function with moderate to severe mitral stenosis.  He was admitted in July of this year due to heart failure in the setting of GI bleeding.  He was transfused 2 units and treated medically for volume overload.  He underwent capsule endoscopy which demonstrated no identifiable lesion but fresh blood in the distal ileum.  It looks like he was referred to [facility] for the possibility of retrograde balloon endoscopy to evaluate further; unfortunately [facility] is not accepting new patients.  He was admitted in September of this year and found to be in complete heart block.  He then presented with GI bleeding again in November with a hemoglobin down to 6.3.  He was transfused 3 units.  He was discharged without Eliquis.  Despite having to deal with all of his medical issues he is in very good spirits today.  He has good days and bad days.  On his good days he can do quite a bit within limitation.  He still gets short of breath at times and is limited by claudication.  He will seeing Dr. Gery Pray soon about potentially pursuing peripheral vascular intervention due to lifestyle limiting  claudication.  His biggest issue however seems to be his ongoing GI bleeding.  It has been relatively stable for some time.  He expresses quite a bit of frustration that he is unable to get into [facility] for advanced endoscopic.  Apparently this needs to be in hospital transfer or needs presents to the emergency department there at [facility] in order to be treated.  He fortunately has not had any recurrent GI bleeding.  He denies any chest pain.  His breathing has been relatively stable on supplemental oxygen.       Current Medications: Current Meds  Medication Sig   acetaminophen (TYLENOL) 650 MG CR tablet Take 1,300 mg by mouth every 8 (eight) hours as needed for pain.   aspirin EC 81 MG tablet Take 1 tablet (81 mg total) by mouth daily. Swallow whole.   atorvastatin (LIPITOR) 80 MG  tablet Take 80 mg by mouth daily.   budesonide (PULMICORT) 0.5 MG/2ML nebulizer solution Take 2 mLs (0.5 mg total) by nebulization 2 (two) times daily.   cetirizine (ZYRTEC) 10 MG tablet Take 10 mg by mouth daily.   colchicine 0.6 MG tablet Take 0.6 mg by mouth daily as needed (Gout).   dicyclomine (BENTYL) 10 MG capsule Take 10 mg by mouth 3 (three) times daily before meals. 30 minutes before eating   FEROSUL 325 (65 Fe) MG tablet TAKE 1 TABLET BY MOUTH DAILY WITH BREAKFAST   fluticasone (FLONASE) 50 MCG/ACT nasal spray Place 1 spray into both nostrils daily.   furosemide (LASIX) 40 MG tablet Take 2 tablets (80 mg total) by mouth daily. (Patient taking differently: Take 40 mg by mouth daily.)   glimepiride (AMARYL) 2 MG tablet Take 2 mg by mouth every morning.   ketoconazole (NIZORAL) 2 % shampoo Apply 1 Application topically daily.   levothyroxine (SYNTHROID, LEVOTHROID) 88 MCG tablet Take 88 mcg by mouth daily before breakfast. Take on an empty stomach   Potassium Chloride ER 20 MEQ TBCR Take 1 tablet by mouth daily.   revefenacin (YUPELRI) 175 MCG/3ML nebulizer solution Take 175 mcg by nebulization in the morning and at bedtime.   spironolactone (ALDACTONE) 25 MG tablet Take 1 tablet (25 mg total) by mouth daily.     Review of Systems:   Please see the history of present illness.    All other systems reviewed and are negative.     EKGs/Labs/Other Test Reviewed:   EKG: EKG November 2024 demonstrates sinus rhythm  EKG Interpretation Date/Time:    Ventricular Rate:    PR Interval:    QRS Duration:    QT Interval:    QTC Calculation:   R Axis:      Text Interpretation:           Risk Assessment/Calculations:    CHA2DS2-VASc Score = 4   This indicates a 4.8% annual risk of stroke. The patient's score is based upon: CHF History: 0 HTN History: 1 Diabetes History: 1 Stroke History: 0 Vascular Disease History: 0 Age Score: 2 Gender Score: 0         Physical Exam:    VS:  BP 126/64   Pulse 100   Ht 5\' 8"  (1.727 m)   Wt 200 lb 6.4 oz (90.9 kg)   SpO2 99% Comment: on 2L of o2  BMI 30.47 kg/m        Wt Readings from Last 3 Encounters:  10/18/23 200 lb 6.4 oz (90.9 kg)  10/05/23 204 lb (92.5 kg)  09/27/23 207 lb 12.8  oz (94.3 kg)      GENERAL:  No apparent distress, AOx3 HEENT:  No carotid bruits, +2 carotid impulses, no scleral icterus CAR: RRR  no murmurs, gallops, rubs, or thrills RES:  Clear to auscultation bilaterally ABD:  Soft, nontender, nondistended, positive bowel sounds x 4 VASC:  +2 radial pulses, +2 carotid pulses NEURO:  CN 2-12 grossly intact; motor and sensory grossly intact PSYCH:  No active depression or anxiety EXT:  No edema, ecchymosis, or cyanosis  Signed, Orbie Pyo, MD  10/18/2023 10:44 AM    [facility] [address] [place], [place], [place]  [address] Phone: [phone]; Fax: [phone]   Note:  This document was prepared using Dragon voice recognition software and may include unintentional dictation errors.

## 2023-10-18 NOTE — Patient Instructions (Signed)
Medication Instructions:  Your physician recommends that you continue on your current medications as directed. Please refer to the Current Medication list given to you today.  *If you need a refill on your cardiac medications before your next appointment, please call your pharmacy*  Testing/Procedures: IN 3 MONTHS: Your physician has requested that you have an echocardiogram. Echocardiography is a painless test that uses sound waves to create images of your heart. It provides your doctor with information about the size and shape of your heart and how well your heart's chambers and valves are working. This procedure takes approximately one hour. There are no restrictions for this procedure. Please do NOT wear cologne, perfume, aftershave, or lotions (deodorant is allowed). Please arrive 15 minutes prior to your appointment time.  Please note: We ask at that you not bring children with you during ultrasound (echo/ vascular) testing. Due to room size and safety concerns, children are not allowed in the ultrasound rooms during exams. Our front office staff cannot provide observation of children in our lobby area while testing is being conducted. An adult accompanying a patient to their appointment will only be allowed in the ultrasound room at the discretion of the ultrasound technician under special circumstances. We apologize for any inconvenience.   Follow-Up: At [facility], you and your health needs are our priority.  As part of our continuing mission to provide you with exceptional heart care, we have created designated Provider Care Teams.  These Care Teams include your primary Cardiologist (physician) and Advanced Practice Providers (APPs -  Physician Assistants and Nurse Practitioners) who all work together to provide you with the care you need, when you need it.   Your next appointment:   6 months  Provider:   Orbie Pyo, MD

## 2023-10-18 NOTE — Telephone Encounter (Signed)
Ok, please inform patient that it may be few months for next available appt. F/u PMD and hematology for IV iron infusions

## 2023-10-21 ENCOUNTER — Ambulatory Visit: Payer: Federal, State, Local not specified - PPO

## 2023-10-21 VITALS — BP 92/61 | HR 91 | Temp 98.1°F | Resp 22 | Ht 68.0 in | Wt 201.0 lb

## 2023-10-21 DIAGNOSIS — D509 Iron deficiency anemia, unspecified: Secondary | ICD-10-CM | POA: Diagnosis not present

## 2023-10-21 MED ORDER — ACETAMINOPHEN 325 MG PO TABS
650.0000 mg | ORAL_TABLET | Freq: Once | ORAL | Status: AC
  Administered 2023-10-21: 650 mg via ORAL
  Filled 2023-10-21: qty 2

## 2023-10-21 MED ORDER — DIPHENHYDRAMINE HCL 25 MG PO CAPS
25.0000 mg | ORAL_CAPSULE | Freq: Once | ORAL | Status: AC
  Administered 2023-10-21: 25 mg via ORAL
  Filled 2023-10-21: qty 1

## 2023-10-21 MED ORDER — SODIUM CHLORIDE 0.9 % IV SOLN
510.0000 mg | Freq: Once | INTRAVENOUS | Status: AC
  Administered 2023-10-21: 510 mg via INTRAVENOUS
  Filled 2023-10-21: qty 17

## 2023-10-21 NOTE — Progress Notes (Signed)
Diagnosis: Iron Deficiency Anemia  Provider:  Chilton Greathouse MD  Procedure: IV Infusion  IV Type: Peripheral, IV Location: R Forearm  Feraheme (Ferumoxytol), Dose: 510 mg  Infusion Start Time: 1225  Infusion Stop Time: 1244  Post Infusion IV Care: Observation period completed and Peripheral IV Discontinued  Discharge: Condition: Good, Destination: Home . AVS Provided  Performed by:  Garnette Czech, RN

## 2023-10-22 ENCOUNTER — Other Ambulatory Visit: Payer: Federal, State, Local not specified - PPO

## 2023-10-22 ENCOUNTER — Ambulatory Visit (HOSPITAL_COMMUNITY)
Admission: RE | Admit: 2023-10-22 | Discharge: 2023-10-22 | Disposition: A | Discharge status: Home / Self Care | Payer: Federal, State, Local not specified - PPO | Source: Ambulatory Visit | Attending: Cardiology | Admitting: Cardiology

## 2023-10-22 DIAGNOSIS — I739 Peripheral vascular disease, unspecified: Secondary | ICD-10-CM | POA: Diagnosis not present

## 2023-10-22 DIAGNOSIS — Z95828 Presence of other vascular implants and grafts: Secondary | ICD-10-CM | POA: Diagnosis not present

## 2023-10-23 DIAGNOSIS — D500 Iron deficiency anemia secondary to blood loss (chronic): Secondary | ICD-10-CM | POA: Diagnosis not present

## 2023-10-23 DIAGNOSIS — I739 Peripheral vascular disease, unspecified: Secondary | ICD-10-CM | POA: Diagnosis not present

## 2023-10-23 DIAGNOSIS — I5032 Chronic diastolic (congestive) heart failure: Secondary | ICD-10-CM | POA: Diagnosis not present

## 2023-10-23 DIAGNOSIS — I4891 Unspecified atrial fibrillation: Secondary | ICD-10-CM | POA: Diagnosis not present

## 2023-10-29 ENCOUNTER — Other Ambulatory Visit: Payer: Self-pay | Admitting: Internal Medicine

## 2023-10-29 ENCOUNTER — Inpatient Hospital Stay: Payer: Federal, State, Local not specified - PPO | Attending: Physician Assistant

## 2023-10-29 DIAGNOSIS — D500 Iron deficiency anemia secondary to blood loss (chronic): Secondary | ICD-10-CM | POA: Insufficient documentation

## 2023-10-29 DIAGNOSIS — K922 Gastrointestinal hemorrhage, unspecified: Secondary | ICD-10-CM | POA: Insufficient documentation

## 2023-10-29 LAB — CBC WITH DIFFERENTIAL (CANCER CENTER ONLY)
Abs Immature Granulocytes: 0.06 10*3/uL (ref 0.00–0.07)
Basophils Absolute: 0 10*3/uL (ref 0.0–0.1)
Basophils Relative: 1 %
Eosinophils Absolute: 0.2 10*3/uL (ref 0.0–0.5)
Eosinophils Relative: 3 %
HCT: 27.2 % — ABNORMAL LOW (ref 39.0–52.0)
Hemoglobin: 8.3 g/dL — ABNORMAL LOW (ref 13.0–17.0)
Immature Granulocytes: 1 %
Lymphocytes Relative: 13 %
Lymphs Abs: 0.8 10*3/uL (ref 0.7–4.0)
MCH: 32.5 pg (ref 26.0–34.0)
MCHC: 30.5 g/dL (ref 30.0–36.0)
MCV: 106.7 fL — ABNORMAL HIGH (ref 80.0–100.0)
Monocytes Absolute: 0.6 10*3/uL (ref 0.1–1.0)
Monocytes Relative: 10 %
Neutro Abs: 4.5 10*3/uL (ref 1.7–7.7)
Neutrophils Relative %: 72 %
Platelet Count: 184 10*3/uL (ref 150–400)
RBC: 2.55 MIL/uL — ABNORMAL LOW (ref 4.22–5.81)
RDW: 18.6 % — ABNORMAL HIGH (ref 11.5–15.5)
WBC Count: 6.3 10*3/uL (ref 4.0–10.5)
nRBC: 0 % (ref 0.0–0.2)

## 2023-10-29 LAB — SAMPLE TO BLOOD BANK

## 2023-11-01 ENCOUNTER — Other Ambulatory Visit: Payer: Self-pay

## 2023-11-04 ENCOUNTER — Inpatient Hospital Stay: Payer: Federal, State, Local not specified - PPO | Attending: Physician Assistant

## 2023-11-04 ENCOUNTER — Other Ambulatory Visit: Payer: Self-pay | Admitting: Medical Oncology

## 2023-11-04 ENCOUNTER — Telehealth: Payer: Self-pay | Admitting: Cardiovascular Disease

## 2023-11-04 ENCOUNTER — Telehealth: Payer: Self-pay | Admitting: Medical Oncology

## 2023-11-04 DIAGNOSIS — D500 Iron deficiency anemia secondary to blood loss (chronic): Secondary | ICD-10-CM | POA: Diagnosis present

## 2023-11-04 DIAGNOSIS — D649 Anemia, unspecified: Secondary | ICD-10-CM

## 2023-11-04 DIAGNOSIS — K922 Gastrointestinal hemorrhage, unspecified: Secondary | ICD-10-CM | POA: Diagnosis not present

## 2023-11-04 LAB — CBC WITH DIFFERENTIAL (CANCER CENTER ONLY)
Abs Immature Granulocytes: 0.03 10*3/uL (ref 0.00–0.07)
Basophils Absolute: 0 10*3/uL (ref 0.0–0.1)
Basophils Relative: 0 %
Eosinophils Absolute: 0.2 10*3/uL (ref 0.0–0.5)
Eosinophils Relative: 4 %
HCT: 22.7 % — ABNORMAL LOW (ref 39.0–52.0)
Hemoglobin: 6.8 g/dL — CL (ref 13.0–17.0)
Immature Granulocytes: 1 %
Lymphocytes Relative: 14 %
Lymphs Abs: 0.7 10*3/uL (ref 0.7–4.0)
MCH: 31.5 pg (ref 26.0–34.0)
MCHC: 30 g/dL (ref 30.0–36.0)
MCV: 105.1 fL — ABNORMAL HIGH (ref 80.0–100.0)
Monocytes Absolute: 0.5 10*3/uL (ref 0.1–1.0)
Monocytes Relative: 10 %
Neutro Abs: 3.4 10*3/uL (ref 1.7–7.7)
Neutrophils Relative %: 71 %
Platelet Count: 204 10*3/uL (ref 150–400)
RBC: 2.16 MIL/uL — ABNORMAL LOW (ref 4.22–5.81)
RDW: 17.7 % — ABNORMAL HIGH (ref 11.5–15.5)
WBC Count: 4.7 10*3/uL (ref 4.0–10.5)
nRBC: 0 % (ref 0.0–0.2)

## 2023-11-04 LAB — SAMPLE TO BLOOD BANK

## 2023-11-04 LAB — PREPARE RBC (CROSSMATCH)

## 2023-11-04 NOTE — Telephone Encounter (Signed)
Patient is calling requesting his 11/19 test results. Please advise.

## 2023-11-04 NOTE — Telephone Encounter (Addendum)
1323- pt confirmed appt for tomorrow @ 0930 for transfusion of 2 units of blood at the [facility] .  1245-Transfusion delay-Spoke to pt in lobby and discussed the delay in transfusing him due to KEL antibody. It takes 2 hours to cross match his blood . I told him I will call him as soon as his blood is ready. I instructed him to go to ED for worsening symptoms , any bleeding and or black ,tarry stools, light headed . He left walking and carrying portable oxygen. I offered wheelchair and he declined.  I instructed him to contact his GI provider with  his HGB and to keep on his blood bank blue armband.  1000-Symptomatic anemia-He feels like he needs blood transfusion -no energy , very weak. He is having 3-4 stools/day and denies black ,tarry stools. Pt brought in today for CBC/diff check and his hgb is 6.8gm. Transfusion ordered.

## 2023-11-04 NOTE — Progress Notes (Signed)
Transfusion ordered.

## 2023-11-04 NOTE — Telephone Encounter (Signed)
Spoke to patient he stated he was returning call.Advised message was sent to Dr.Berry's RN.

## 2023-11-04 NOTE — Telephone Encounter (Signed)
Spoke to patient he stated he was calling back to let Dr.Berry know he is having severe pain in both legs.Stated he is having trouble walking.He wants Dr.Berry to schedule leg procedure.Advised I will send message to Dr.Berry's RN.

## 2023-11-04 NOTE — Telephone Encounter (Signed)
Patient states he is returning a call regarding this matter. Please advise.

## 2023-11-05 ENCOUNTER — Inpatient Hospital Stay: Payer: Federal, State, Local not specified - PPO

## 2023-11-05 ENCOUNTER — Other Ambulatory Visit: Payer: Self-pay | Admitting: Acute Care

## 2023-11-05 ENCOUNTER — Other Ambulatory Visit: Payer: Self-pay | Admitting: Medical Oncology

## 2023-11-05 DIAGNOSIS — Z122 Encounter for screening for malignant neoplasm of respiratory organs: Secondary | ICD-10-CM

## 2023-11-05 DIAGNOSIS — D649 Anemia, unspecified: Secondary | ICD-10-CM

## 2023-11-05 DIAGNOSIS — Z87891 Personal history of nicotine dependence: Secondary | ICD-10-CM

## 2023-11-05 DIAGNOSIS — D500 Iron deficiency anemia secondary to blood loss (chronic): Secondary | ICD-10-CM | POA: Diagnosis not present

## 2023-11-05 MED ORDER — SODIUM CHLORIDE 0.9% IV SOLUTION
250.0000 mL | INTRAVENOUS | Status: DC
  Administered 2023-11-05: 250 mL via INTRAVENOUS

## 2023-11-05 MED ORDER — ACETAMINOPHEN 325 MG PO TABS
650.0000 mg | ORAL_TABLET | Freq: Once | ORAL | Status: AC
  Administered 2023-11-05: 650 mg via ORAL
  Filled 2023-11-05: qty 2

## 2023-11-05 MED ORDER — DIPHENHYDRAMINE HCL 25 MG PO CAPS
25.0000 mg | ORAL_CAPSULE | Freq: Once | ORAL | Status: AC
  Administered 2023-11-05: 25 mg via ORAL
  Filled 2023-11-05: qty 1

## 2023-11-05 NOTE — Patient Instructions (Signed)

## 2023-11-06 LAB — TYPE AND SCREEN
ABO/RH(D): O POS
Antibody Screen: POSITIVE
Donor AG Type: NEGATIVE
Unit division: 0
Unit division: 0
Unit division: 0

## 2023-11-06 LAB — BPAM RBC
Blood Product Expiration Date: 202412272359
Blood Product Unit Number: 202412272359
ISSUE DATE / TIME: 202412031024
PRODUCT CODE: 202412031024
PRODUCT CODE: 202412272359
Unit Type and Rh: 202412272359
Unit Type and Rh: 5100
Unit Type and Rh: 5100
Unit Type and Rh: 5100

## 2023-11-10 DIAGNOSIS — J441 Chronic obstructive pulmonary disease with (acute) exacerbation: Secondary | ICD-10-CM | POA: Diagnosis not present

## 2023-11-11 ENCOUNTER — Ambulatory Visit: Payer: Federal, State, Local not specified - PPO | Attending: Internal Medicine | Admitting: Internal Medicine

## 2023-11-11 VITALS — BP 80/49 | HR 110 | Ht 68.0 in | Wt 212.0 lb

## 2023-11-11 DIAGNOSIS — Z79899 Other long term (current) drug therapy: Secondary | ICD-10-CM | POA: Diagnosis not present

## 2023-11-11 DIAGNOSIS — I959 Hypotension, unspecified: Secondary | ICD-10-CM | POA: Diagnosis not present

## 2023-11-11 DIAGNOSIS — I480 Paroxysmal atrial fibrillation: Secondary | ICD-10-CM

## 2023-11-11 DIAGNOSIS — K922 Gastrointestinal hemorrhage, unspecified: Secondary | ICD-10-CM

## 2023-11-11 DIAGNOSIS — Z951 Presence of aortocoronary bypass graft: Secondary | ICD-10-CM

## 2023-11-11 DIAGNOSIS — I350 Nonrheumatic aortic (valve) stenosis: Secondary | ICD-10-CM

## 2023-11-11 DIAGNOSIS — I5032 Chronic diastolic (congestive) heart failure: Secondary | ICD-10-CM

## 2023-11-11 DIAGNOSIS — I779 Disorder of arteries and arterioles, unspecified: Secondary | ICD-10-CM

## 2023-11-11 NOTE — Patient Instructions (Signed)
Medication Instructions:  HOLD spironolactone AND lasix until we check in with you on Thursday   *If you need a refill on your cardiac medications before your next appointment, please call your pharmacy*   Lab Work: BMET and CBC at [facility] on 12/10  If you have labs (blood work) drawn today and your tests are completely normal, you will receive your results only by: MyChart Message (if you have MyChart) OR A paper copy in the mail If you have any lab test that is abnormal or we need to change your treatment, we will call you to review the results.    Follow-Up: At [facility], you and your health needs are our priority.  As part of our continuing mission to provide you with exceptional heart care, we have created designated Provider Care Teams.  These Care Teams include your primary Cardiologist (physician) and Advanced Practice Providers (APPs -  Physician Assistants and Nurse Practitioners) who all work together to provide you with the care you need, when you need it.  We recommend signing up for the patient portal called "MyChart".  Sign up information is provided on this After Visit Summary.  MyChart is used to connect with patients for Virtual Visits (Telemedicine).  Patients are able to view lab/test results, encounter notes, upcoming appointments, etc.  Non-urgent messages can be sent to your provider as well.   To learn more about what you can do with MyChart, go to [email or web address].    Your next appointment:    3-4 months with [name]

## 2023-11-11 NOTE — Progress Notes (Unsigned)
OFFICE NOTE  Chief Complaint:  Follow-up hospitalization  Primary Care Physician: [name], MD  HPI:  Jeremiah [name] is a 77 y.o. 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 He is originally from [place] and his wife who is accompanying him today is originally from [place]. Jeremiah [name] had an MI in 2006 and underwent coronary artery bypass grafting with a LIMA to LAD, SVG to OM1, and SVG to RPDA. He did fairly well with this however 2015 was having chest discomfort. He underwent a nuclear stress test which showed possible reversible ischemia. Therefore he was referred for cardiac catheterization at the [facility] [place]. That demonstrated all 3 patent bypass grafts with multivessel coronary disease. At that time he was also having episodes of palpitations and a monitor demonstrated PSVT. He was scheduled for possible ablation but is had chronic problems with an ankle fracture and nonhealing wound. He therefore never underwent ablation. He was placed on digoxin and has been on metoprolol but stopped the digoxin at some point in the past. He reports over the past 8-12 months she's had no further palpitations. He also has a history of dyslipidemia, hypothyroidism, and borderline diabetes. Recently he had a sleep study through [facility] physicians which demonstrated severe obstructive sleep apnea and BiPAP was recommended with settings of 19/14 cm water pressure. Currently he denies any chest pain or worsening shortness of breath. His last echocardiogram from his cardiologist in [place] was a 2015 which showed an EF of 58% and no significant valvular disease.  07/26/2016  Jeremiah [name] returns today for follow-up. He was recently seen in the emergency department in the beginning of June for chest pain. This was associated with tachycardia and probable SVT. Troponin was noted to be elevated to 0.12. He had signs and symptoms concerning for unstable angina. He was  evaluated by my partners and felt to need cardiac catheterization. Eventually he underwent cardiac catheterization by Dr. Katrinka Blazing with the results as follows:  Conclusion   LM lesion, 85% stenosed. Ost 1st Mrg lesion, 100% stenosed. SVG was injected . Ost RCA to Prox RCA lesion, 100% stenosed. SVG . Origin to Prox Graft lesion, 40% stenosed. Mid RCA to Dist RCA lesion, 100% stenosed. Prox LAD lesion, 100% stenosed. LIMA .   Patent saphenous vein grafts. SVG to PDA contains eccentric 40% proximal stenosis. SVG to the circumflex is widely patent. LIMA to the LAD is widely patent. Native distal left main contains 75% stenosis. The dominant obtuse marginal branch is totally occluded. The native right coronary is totally occluded. Left ventriculogram demonstrates inferior wall hypokinesis. EF 50%. Recent chest pain in the setting of PSVT with minimal enzyme elevation likely related to demand ischemia in the setting of underlying native coronary disease.   Recommendations: Medical therapy. Management of PSVT with medications versus ablation.   Since discharge she denies any further episodes of tachycardia palpitations. He is already on a high dose of beta blocker. He's had a small amount of weight gain but has stopped smoking.  He plans to start to do more exercise and work on weight loss. I'm concerned about his recurrent palpitations and the fact that asymptomatic and has demand ischemia related to them and bypass graft insufficiency.  06/10/2017  Jeremiah [name] returns today for follow-up. It's been almost a year since I last saw him. He was found to have recurrent symptomatic SVT and underwent comprehensive EP study and was diagnosed with classic AV nodal reentry tachycardia  and subsequently underwent selective radiofrequency ablation by Dr. Elberta Fortis. He says since that time he's had no further episodes of tachypalpitations. Since then he was hospitalized twice, after developing C. difficile colitis  and enteritis with GI bleed in January. Subsequently he developed acute congestive heart failure was found to have a mild reduction in LVEF to 45-50% by echo in March 2018. At that time he was on furosemide 40 mg daily. He had a follow-up with Azalee Course, PA-C in April who noted he was on Lasix 40 mg twice daily, however he reported that he was not taking that to me today. Fact he says he is now off of Lasix. He reports worsening shortness of breath and hasn't fact had a 13 pound weight gain since April. He denies any lower extremity edema. He has been working with Dr. Sherene Sires for his shortness of breath.  07/22/2017  Jeremiah [name] is back today for follow-up. He restarted his Lasix at my direction his weight is now down from 252-244. He's had significant improvement in swelling and reports some improvement in his breathing however he says it still not good. He does have a mildly reduced EF of 45-50% but also has some chronic lung disease which is likely contributing. He says he gets short of breath while doing certain activities in the yard and then comes inside and uses his oxygen which improves him almost immediately. He denies any orthopnea. He has not had any recent or productive cough. He has a follow-up visit with his pulmonologist tomorrow. I had ordered lab work including a metabolic profile and BNP which was never obtained.  09/09/2018  Jeremiah [name] is seen today in follow-up.  He reports some improvement in his shortness of breath.  He has joined a gym and now is exercising every day.  Weight however has been stable at 252, actually up 2 pounds from April.  Despite this his breathing has improved somewhat.  He still uses oxygen.  He did have one episode recently where he became nauseated and vomited.  He was up at the [facility] [place] and had been taking antibiotics for a dental infection.  He did undergo CT scan of the head and neck and that demonstrated bilateral carotid artery calcification.  This is not  surprising given his history of coronary artery disease and prior CABG however he has not had prior carotid Dopplers.    02/18/2019  Jeremiah [name] is seen today in follow-up.  Recently he says has had some improvement in shortness of breath.  Dr. Sherene Sires, his pulmonologist had started him on a new nebulizer called revefenacin.  His most recent hemoglobin A1c was 7.1.  Weight is down about 4 pounds.  He denies any new chest pain.  He has had some recent anemia and work-up is apparently underway for this.  He is tentatively scheduled for an endoscopy on April 16 with Dr. Marca Ancona.  He is concerned because of his pulmonary status that he may be at high risk of decompensation with the procedure.  He also has follow-up with his PCP in the near future.  09/14/2019  Jeremiah [name] returns today for follow-up.  He continues to do well.  His shortness of breath has improved which I think was both believed due to a combination of pulmonary optimization as well as improvement in LV function.  Recently his echo in fact showed LVEF has normalized to 60 to 65%.  He remains euvolemic and is actually 2 pounds lighter than he was seen about  8 months ago.  Hemoglobin A1c has been reasonably well-controlled however his lipids are still uncontrolled.  His total cholesterol is 211, triglycerides 494, HDL 32 and LDL of 114.  His target LDL is less than 70 and his elevated triglycerides also put him at increased cardiovascular risk.  03/02/2020  Jeremiah [name] is seen today in follow-up.  He denies any chest pain but does have some shortness of breath.  Is been some weight gain.  He reports less physical activity has been struggling with a long recovery after undergoing circumcision for issues with phimosis.  Its unfortunate that he had a difficult recovery but seems to be doing better.  Blood pressure was well controlled today.  His EKG demonstrated some worsening inferior and lateral T wave inversions.  This was previously seen however not as  significant.  His last heart cath was in 2017 which did show a patent SVG to OM and occluded SVG to right coronary.  He is anticipating restarting an exercise program and working with a Psychologist, educational.  02/24/2021  Jeremiah [name] is seen today in follow-up.  Overall he says he is feeling reasonably well.  He has had some shortness of breath.  He is scheduled for a sleep study.  We discussed this because of the previously mentioned issues and that is actually scheduled tonight.  He also has a history of heart failure with diabetes which is not at target A1c of 8.3.  Lipids recently at assess her total cholesterol 106, HDL 33, LDL 49 and triglycerides 134.  07/18/2021  Jeremiah [name] is seen today for routine follow-up.  He seems to be doing well on the Jardiance at 25 mg daily.  Hemoglobin A1c is now come down to 7.3.  Blood pressure is well controlled.  EKG is unchanged showing sinus rhythm with some lateral T wave changes.  He has noted some lower blood pressures at home.  He says around the mid 90s over 63s.  Blood pressure here was elevated 130/90 however more typical readings are between 90 and 100 systolic.  He says he is asymptomatic for this.  He is on very little medicine.  1 option would be to consider backing off on the bisoprolol.  Cholesterol is very well controlled as of February total 106, HDL 33, LDL 49 triglycerides 134.  I do not believe it is "too low".  Would recommend continue his current regimen.  02/02/2022  Jeremiah [name] is seen today in follow-up.  Overall he says he is doing very well.  Weight has been fairly stable.  Blood pressure is well controlled.  He has been undergoing some rehab for a frozen shoulder.  He denies any worsening shortness of breath.  Recent labs showed total cholesterol 153, HDL 33 triglycerides 291 and LDL 73.  A1c 7.3%.  EKG shows a sinus rhythm with some inferolateral T wave changes (unchanged from prior EKG in 2022).  09/07/2022  Jeremiah [name] is seen today in follow-up.  Overall  he seems to be doing pretty well.  He is actually lost quite a bit of weight recently.  He is down to 215 pounds but was as high as 245 back in March of this past year.  He has made some dietary changes.  He is also had some issues with his GI system and is having an upcoming endoscopy.  Blood pressure is well controlled.  He denies any chest pain.  He did have a recent syncopal episode.  He said he was working on his house  and had been not sleeping well and thought he was dehydrated and simply passed out.  It does not sound like there was a clear prodrome prior to that but if he feels it was likely related to exhaustion.  He has never passed out before.  He has not had any subsequent episodes.  12/27/2022  Jeremiah [name] returns today for follow-up.  Recently he is followed up with Dr. Sherene Sires.  Has been having issues with reflux and had EGD and colonoscopy.  He was noted to have I believe 24 lesions of the esophagus, likely Barrett's esophagus.  He has had vomiting and is scheduled for a gastric emptying study.  Despite this, he is also made significant dietary changes and reports in the past 3 months he is lost about 35 pounds.  It was noted that he is markedly hypotensive today with blood pressure 82/46.  He was also seen recently as mentioned with Dr. Sherene Sires and had a CT scan of the chest.  Besides emphysema this demonstrated aortic valve calcification as well as pulmonary artery dilatation suggestive of pulmonary hypertension.  He has been short of breath but uses home oxygen.  He also tells me that he has an upcoming visit for periodontal surgery with Dr. Jeanice Lim.  02/18/2023  Jeremiah [name] is seen today as an add-on visit.  Unfortunately he was just hospitalized over the past week with dehydration, nausea vomiting and diarrhea as well as abdominal pain.  He was seen in [facility] ER and stabilized and then presented to his PCP was sent to [facility].  There he was admitted and found to have norovirus gastroenteritis in addition to what  was described as an ischemic colitis.  His medications were held including his diuretics and blood pressure medicines to allow higher blood pressure.  I had recently cut back on his blood pressure medicine in January, stopping his losartan.  Since discharge she has had some persistent diarrhea.  He was noted to be somewhat anemic with a hemoglobin of 11.4.  His echo was read by myself and surprisingly indicated that he has moderate to severe mitral stenosis with elevated left atrial pressure.  This is likely contributing to some of his symptoms of shortness of breath.  Today he says he feels quite fatigued and is very symptomatic.  He is lost a significant amount of weight down another 15 pounds since I saw him in January.  In addition to the mitral stenosis, his EKG today shows atrial fibrillation.  This is a new diagnosis for him.  It does not appear that he had an EKG at [facility] that I could find but did have an EKG at [facility] which showed sinus rhythm with marked sinus arrhythmia although I would say it is questionably A-fib.  03/28/2023  Jeremiah [name] returns today for follow-up.  Unfortunately he has been hospitalized at least twice since I last saw him at [facility].  He has had issues with recurrent diarrhea and then unfortunately GI bleeding.  He was found to have mesenteric ischemia and underwent left brachial artery cutdown and mesenteric artery and celiac artery stenting.  Fortunately, he was noted to be in sinus rhythm when he presented although we had been working him up for possible TEE cardioversion.  Ultimately cardiology was consulted and they recommended a TEE to further evaluate his mitral valve disease.  Eliquis was held but after stenting ultimately aspirin and Eliquis were restarted.  He is TEE demonstrated normal LVEF with heavily calcified mitral valve that was thought to be degenerative  with mild MR and a mean gradient of 5.8 at 65 bpm consistent with severe mitral stenosis with possibly low gradient.   There was also diffuse calcification of the aortic valve which planimeter to 1.2 cm with a peak velocity of 3.2 m/s thought to be moderately stenotic.  He required 8 units of packed red blood cells and after discharge remained anemic with a hemoglobin of 7.8.  He remains on oxygen and was recently started on iron.  We discussed today about possible management options.  I do feel that mitral stenosis is likely a major risk factor for worsening shortness of breath as well as development of recurrent atrial fibrillation and stroke.  They had discussed the possibility of a Watchman device with him however with his peripheral arterial disease and mitral stenosis I think this would be a suboptimal procedure.  Ideally 1 would consider mitral valve replacement surgery with left atrial appendage closure surgically, and possibly consider aortic valve intervention.  However his comorbidities, recent GI bleeding and mesenteric ischemia may make him a prohibitive surgical candidate.  Additionally he has had prior CABG and this would be a redo sternotomy.  06/26/2023  Jeremiah [name] returns today for follow-up.  Again he was hospitalized at this time at [facility] for severe anemia and acute on chronic heart failure.  I did see him as I was on service in the hospital.  We participated in a diuresis.  Workup revealed severe anemia.  He was given IV iron.  His blood thinners have been stopped.  Subsequently after discharge and substantial diuresis of 15 pounds, he was discharged on 80 mg Lasix daily and Aldactone.  Since follow-up his weight has remained stable or is actually a few pounds lighter.  He recently saw Dr. Sherene Sires with pulmonary who made an adjustment changing his metoprolol over to bisoprolol.  He has noted no change in his symptoms with this.  He was also seen at [facility] for capsule endoscopy.  This suggested that there was some small bowel AVM however options to try to alleviate bleeding from this are limited.  They had  suggested possibly a referral to [facility] to see about a "balloon procedure".  Today he is in good spirits.  He reports his symptoms are pretty stable.  He has had some improvement in nasal allergy symptoms with Flonase and is requesting a refill for that.  He is scheduled to see hematology tomorrow and my partners in October and November to address the Watchman device and possible mitral valve intervention.  PMHx:  Past Medical History:  Diagnosis Date   A-fib (HCC)    Acute on chronic systolic (congestive) heart failure (HCC) 06/10/2017   Acute pulmonary edema (HCC)    Anemia    Aortic valve sclerosis 12/2018   Noted on ECHO   Arthritis    CAD (coronary artery disease)    a. 2006: CABG in 2006 with LIMA-LAD, SVG-OM1, and SVG-RPDA   Cardiomegaly 12/2018   Stable, noted on CXR   Carpal tunnel syndrome    Right   Chronic pain 03/21/2016   Colon cancer (HCC) 2006   COPD (chronic obstructive pulmonary disease) (HCC)    Diabetes mellitus without complication (HCC)    DVT (deep venous thrombosis) (HCC)    Right   Essential hypertension 03/21/2016   GERD (gastroesophageal reflux disease)    History of blood transfusion    History of Clostridioides difficile infection    History of prostate cancer 03/21/2016   History of PSVT (paroxysmal  supraventricular tachycardia)    HLD (hyperlipidemia)    HTN (hypertension)    Hx of CABG 2006   Hypercholesteremia 03/21/2016   Hypothyroidism    LAE (left atrial enlargement) 12/2018   Severe, Noted on ECHO   LVH (left ventricular hypertrophy) 12/2018   Mild, Noted on ECHO   Medication management 03/21/2016   Mitral annular calcification 12/2018   with mild MS, Noted on ECHO   Morbid obesity (HCC) 03/21/2016   Myocardial infarct (HCC)    OSA (obstructive sleep apnea) 03/21/2016   uses oxygen at night time   Pain in right ankle and joints of right foot 03/21/2016   Paronychia 03/21/2016   Phimosis    Pneumonia    Primary insomnia 03/21/2016    Prostate cancer (HCC) 2008   Pulmonary hypertension (HCC) 12/2018   Moderate, Noted on ECHO   Tobacco dependence 03/21/2016   Tricuspid regurgitation 12/2018   Mild, Noted on ECHO    Past Surgical History:  Procedure Laterality Date   ANKLE SURGERY Right 12/2013   CARDIAC CATHETERIZATION N/A 05/08/2016   Procedure: Left Heart Cath and Cors/Grafts Angiography;  Surgeon: Lyn Records, MD;  Location: [facility] INVASIVE CV LAB;  Service: Cardiovascular;  Laterality: N/A;   CIRCUMCISION N/A 10/07/2019   Procedure: CIRCUMCISION ADULT;  Surgeon: Crist Fat, MD;  Location: [facility] ORS;  Service: Urology;  Laterality: N/A;   CORONARY ARTERY BYPASS GRAFT  2006   x3   ELECTROPHYSIOLOGIC STUDY N/A 08/24/2016   Procedure: SVT Ablation;  Surgeon: Will Jorja Loa, MD;  Location: [facility] INVASIVE CV LAB;  Service: Cardiovascular;  Laterality: N/A;   PACEMAKER IMPLANT N/A 08/23/2023   Procedure: PACEMAKER IMPLANT;  Surgeon: Regan Lemming, MD;  Location: [facility] INVASIVE CV LAB;  Service: Cardiovascular;  Laterality: N/A;   PROSTATE SURGERY  2008   PTCA     UMBILICAL HERNIA REPAIR N/A 05/26/2022   Procedure: PRIMARY REPAIR OF STRANGULATED UMBILICAL HERNIA WITH PARTIAL OMENTECTOMY;  Surgeon: Almond Lint, MD;  Location: [facility] OR;  Service: General;  Laterality: N/A;    FAMHx:  Family History  Problem Relation Age of Onset   Dementia Mother    Diabetes Sister     SOCHx:   reports that he quit smoking about 6 years ago. His smoking use included cigarettes. He started smoking about 66 years ago. He has a 45 pack-year smoking history. He has never used smokeless tobacco. He reports that he does not currently use alcohol. He reports that he does not use drugs.  ALLERGIES:  Allergies  Allergen Reactions   Shellfish Allergy Shortness Of Breath, Nausea And Vomiting and Rash    Scallops     Fluzone [Influenza Virus Vaccine] Nausea And Vomiting, Palpitations and Rash    ROS: Pertinent items noted in HPI  and remainder of comprehensive ROS otherwise negative.  HOME MEDS: Current Outpatient Medications  Medication Sig Dispense Refill   acetaminophen (TYLENOL) 650 MG CR tablet Take 1,300 mg by mouth every 8 (eight) hours as needed for pain.     aspirin EC 81 MG tablet Take 1 tablet (81 mg total) by mouth daily. Swallow whole. 90 tablet 3   atorvastatin (LIPITOR) 80 MG tablet Take 80 mg by mouth daily.     budesonide (PULMICORT) 0.5 MG/2ML nebulizer solution Take 2 mLs (0.5 mg total) by nebulization 2 (two) times daily. 120 mL 11   cetirizine (ZYRTEC) 10 MG tablet Take 10 mg by mouth daily.     colchicine 0.6 MG tablet Take  0.6 mg by mouth daily as needed (Gout).     dicyclomine (BENTYL) 10 MG capsule Take 10 mg by mouth 3 (three) times daily before meals. 30 minutes before eating     doxycycline (VIBRA-TABS) 100 MG tablet Take 100 mg by mouth daily.     fluticasone (FLONASE) 50 MCG/ACT nasal spray SHAKE LIQUID AND USE 1 SPRAY IN EACH NOSTRIL DAILY 16 g 2   furosemide (LASIX) 40 MG tablet Take 2 tablets (80 mg total) by mouth daily. (Patient taking differently: Take 40 mg by mouth daily.) 30 tablet 0   glimepiride (AMARYL) 2 MG tablet Take 2 mg by mouth every morning.     guaiFENesin-codeine 100-10 MG/5ML syrup Take 5 mLs by mouth every 6 (six) hours as needed.     ketoconazole (NIZORAL) 2 % shampoo Apply 1 Application topically daily.     levothyroxine (SYNTHROID, LEVOTHROID) 88 MCG tablet Take 88 mcg by mouth daily before breakfast. Take on an empty stomach     Potassium Chloride ER 20 MEQ TBCR Take 1 tablet by mouth daily.     predniSONE (DELTASONE) 20 MG tablet Take 20 mg by mouth daily with breakfast.     revefenacin (YUPELRI) 175 MCG/3ML nebulizer solution Take 175 mcg by nebulization in the morning and at bedtime.     spironolactone (ALDACTONE) 25 MG tablet Take 1 tablet (25 mg total) by mouth daily. 30 tablet 0   FEROSUL 325 (65 Fe) MG tablet TAKE 1 TABLET BY MOUTH DAILY WITH BREAKFAST  (Patient not taking: Reported on 11/11/2023) 90 tablet 3   No current facility-administered medications for this visit.    LABS/IMAGING: No results found for this or any previous visit (from the past 48 hour(s)).  No results found.  WEIGHTS: Wt Readings from Last 3 Encounters:  11/11/23 212 lb (96.2 kg)  11/05/23 209 lb 11.2 oz (95.1 kg)  10/21/23 201 lb (91.2 kg)    VITALS: BP (!) 80/49   Pulse (!) 110   Ht 5\' 8"  (1.727 m)   Wt 212 lb (96.2 kg)   SpO2 99%   BMI 32.23 kg/m   EXAM: General appearance: alert, no distress, and pale Neck: no carotid bruit, no JVD, and thyroid not enlarged, symmetric, no tenderness/mass/nodules Lungs: diminished breath sounds bilaterally Heart: irregularly irregular rhythm, S1, S2 normal, and systolic murmur: holosystolic 3/6, blowing at 2nd right intercostal space Abdomen: Soft, mild tenderness to palpation Extremities: extremities normal, atraumatic, no cyanosis or edema Pulses: 2+ and symmetric Skin: Pale, cool, dry Neurologic: Grossly normal Psych: Pleasant  EKG:     ASSESSMENT: Newly recognized atrial fibrillation -CHA2DS2-VASc score of 4, currently in sinus rhythm Moderate aortic stenosis and severe mitral stenosis Ischemic colitis with recent GI bleeding Recent SVT which was symptomatic-repeat cardiac catheterization (05/2016) shows patent grafts, status post ablation of AV nodal reentry tachycardia (08/2016) CAD status post three-vessel CABG in 2006 (LIMA to LAD, SVG to OM1, SVG to RPDA) in [place] Patent bypass grafts by cath in 2015 Dyslipidemia on Lipitor OSA-BiPAP recommended COPD Chronic systolic congestive heart failure-LVEF 45-50% -improved to 60 to 65% (12/2018)  Phimosis Syncope  PLAN: 1.   Jeremiah [name] was again hospitalized with severe symptomatic anemia and heart failure.  He diuresed 15 pounds and is feeling much better.  His hemoglobin is, mildly up to 8.5.  He had a recent capsule endoscopy which suggested small  bowel AVM, however therapeutic options are limited.  He was apparently referred to [facility] for management of this.  He seems to be doing well on 80 mg Lasix daily plus spironolactone.  We will continue him off of anticoagulation for the time being.  Fortunately he is in sinus rhythm but I am concerned about significant risk of bleeding.  He has a follow-up scheduled to evaluate for possible Watchman device in October and we will see the structural heart clinic in November for their opinion regarding his mitral valve disease.  Follow-up with me in 6 months.  Chrystie Nose, MD, [facility], FACP  [place]  [facility]  Medical Director of the [facility] Lipid [facility] &  Cardiovascular Risk Reduction Clinic Diplomate of the American Board of Clinical Lipidology Attending Cardiologist  Direct Dial: [phone]  Fax: [phone]  Website:  [email or web address] [email or web address] Gracy 11/11/2023, 2:50 PM

## 2023-11-12 ENCOUNTER — Inpatient Hospital Stay: Payer: Federal, State, Local not specified - PPO

## 2023-11-12 DIAGNOSIS — I5032 Chronic diastolic (congestive) heart failure: Secondary | ICD-10-CM | POA: Diagnosis not present

## 2023-11-12 DIAGNOSIS — D500 Iron deficiency anemia secondary to blood loss (chronic): Secondary | ICD-10-CM

## 2023-11-12 DIAGNOSIS — Z79899 Other long term (current) drug therapy: Secondary | ICD-10-CM | POA: Diagnosis not present

## 2023-11-12 LAB — CBC WITH DIFFERENTIAL (CANCER CENTER ONLY)
Abs Immature Granulocytes: 0.05 10*3/uL (ref 0.00–0.07)
Basophils Absolute: 0 10*3/uL (ref 0.0–0.1)
Basophils Relative: 1 %
Eosinophils Absolute: 0 10*3/uL (ref 0.0–0.5)
Eosinophils Relative: 0 %
HCT: 25.9 % — ABNORMAL LOW (ref 39.0–52.0)
Hemoglobin: 7.7 g/dL — ABNORMAL LOW (ref 13.0–17.0)
Immature Granulocytes: 1 %
Lymphocytes Relative: 19 %
Lymphs Abs: 1 10*3/uL (ref 0.7–4.0)
MCH: 29.6 pg (ref 26.0–34.0)
MCHC: 29.7 g/dL — ABNORMAL LOW (ref 30.0–36.0)
MCV: 99.6 fL (ref 80.0–100.0)
Monocytes Absolute: 0.6 10*3/uL (ref 0.1–1.0)
Monocytes Relative: 12 %
Neutro Abs: 3.5 10*3/uL (ref 1.7–7.7)
Neutrophils Relative %: 67 %
Platelet Count: 244 10*3/uL (ref 150–400)
RBC: 2.6 MIL/uL — ABNORMAL LOW (ref 4.22–5.81)
RDW: 16.2 % — ABNORMAL HIGH (ref 11.5–15.5)
WBC Count: 5.1 10*3/uL (ref 4.0–10.5)
nRBC: 0 % (ref 0.0–0.2)

## 2023-11-12 LAB — SAMPLE TO BLOOD BANK

## 2023-11-13 ENCOUNTER — Other Ambulatory Visit: Payer: Self-pay

## 2023-11-13 DIAGNOSIS — K922 Gastrointestinal hemorrhage, unspecified: Secondary | ICD-10-CM | POA: Diagnosis not present

## 2023-11-13 DIAGNOSIS — D500 Iron deficiency anemia secondary to blood loss (chronic): Secondary | ICD-10-CM

## 2023-11-13 DIAGNOSIS — R053 Chronic cough: Secondary | ICD-10-CM | POA: Diagnosis not present

## 2023-11-13 DIAGNOSIS — Z79899 Other long term (current) drug therapy: Secondary | ICD-10-CM | POA: Diagnosis not present

## 2023-11-13 LAB — BASIC METABOLIC PANEL
BUN/Creatinine Ratio: 22 (ref 10–24)
BUN: 19 mg/dL (ref 8–27)
CO2: 25 mmol/L (ref 20–29)
Calcium: 9 mg/dL (ref 8.6–10.2)
Chloride: 97 mmol/L (ref 96–106)
Creatinine, Ser: 0.86 mg/dL (ref 0.76–1.27)
Glucose: 302 mg/dL — ABNORMAL HIGH (ref 70–99)
Potassium: 4.3 mmol/L (ref 3.5–5.2)
Sodium: 138 mmol/L (ref 134–144)
eGFR: 90 mL/min/{1.73_m2} (ref >59)

## 2023-11-13 LAB — CBC

## 2023-11-13 LAB — PREPARE RBC (CROSSMATCH)

## 2023-11-13 NOTE — Progress Notes (Signed)
Verbal order given to administer 1 unit PRBC from Dr. Arbutus Ped. Spoke with Tresa Endo in the blood bank.  T & S order received. Pt will have 1 uit of PRBC transfused on 12/12.

## 2023-11-14 ENCOUNTER — Inpatient Hospital Stay: Payer: Federal, State, Local not specified - PPO

## 2023-11-14 ENCOUNTER — Telehealth: Payer: Self-pay | Admitting: Internal Medicine

## 2023-11-14 DIAGNOSIS — D500 Iron deficiency anemia secondary to blood loss (chronic): Secondary | ICD-10-CM

## 2023-11-14 MED ORDER — SODIUM CHLORIDE 0.9% IV SOLUTION
250.0000 mL | INTRAVENOUS | Status: DC
  Administered 2023-11-14: 100 mL via INTRAVENOUS

## 2023-11-14 MED ORDER — DIPHENHYDRAMINE HCL 25 MG PO CAPS
25.0000 mg | ORAL_CAPSULE | Freq: Once | ORAL | Status: AC
  Administered 2023-11-14: 25 mg via ORAL
  Filled 2023-11-14: qty 1

## 2023-11-14 NOTE — Patient Instructions (Signed)
Blood Transfusion, Adult A blood transfusion is a procedure in which you receive blood through an IV tube. You may need this procedure because of: A bleeding disorder. An illness. An injury. A surgery. The blood may come from someone else (a donor). You may also be able to donate blood for yourself before a surgery. The blood given in a transfusion may be made up of different types of cells. You may get: Red blood cells. These carry oxygen to the cells in the body. Platelets. These help your blood to clot. Plasma. This is the liquid part of your blood. It carries proteins and other substances through the body. White blood cells. These help you fight infections. If you have a clotting disorder, you may also get other types of blood products. Depending on the type of blood product, this procedure may take 1-4 hours to complete. Tell your doctor about: Any bleeding problems you have. Any reactions you have had during a blood transfusion in the past. Any allergies you have. All medicines you are taking, including vitamins, herbs, eye drops, creams, and over-the-counter medicines. Any surgeries you have had. Any medical conditions you have. Whether you are pregnant or may be pregnant. What are the risks? Talk with your health care provider about risks. The most common problems include: A mild allergic reaction. This includes red, swollen areas of skin (hives) and itching. Fever or chills. This may be the body's response to new blood cells received. This may happen during or up to 4 hours after the transfusion. More serious problems may include: A serious allergic reaction. This includes breathing trouble or swelling around the face and lips. Too much fluid in the lungs. This may cause breathing problems. Lung injury. This causes breathing trouble and low oxygen in the blood. This can happen within hours of the transfusion or days later. Too much iron. This can happen after getting many blood  transfusions over a period of time. An infection or virus passed through the blood. This is rare. Donated blood is carefully tested before it is given. Your body's defense system (immune system) trying to attack the new blood cells. This is rare. Symptoms may include fever, chills, nausea, low blood pressure, and low back or chest pain. Donated cells attacking healthy tissues. This is rare. What happens before the procedure? You will have a blood test to find out your blood type. The test also finds out what type of blood your body will accept and matches it to the donor type. If you are going to have a planned surgery, you may be able to donate your own blood. This may be done in case you need a transfusion. You will have your temperature, blood pressure, and pulse checked. You may receive medicine to help prevent an allergic reaction. This may be done if you have had a reaction to a transfusion before. This medicine may be given to you by mouth or through an IV tube. What happens during the procedure?  An IV tube will be put into one of your veins. The bag of blood will be attached to your IV tube. Then, the blood will enter through your vein. Your temperature, blood pressure, and pulse will be checked often. This is done to find early signs of a transfusion reaction. Tell your nurse right away if you have any of these symptoms: Shortness of breath or trouble breathing. Chest or back pain. Fever or chills. Red, swollen areas of skin or itching. If you have any signs   or symptoms of a reaction, your transfusion will be stopped. You may also be given medicine. When the transfusion is finished, your IV tube will be taken out. Pressure may be put on the IV site for a few minutes. A bandage (dressing) will be put on the IV site. The procedure may vary among doctors and hospitals. What happens after the procedure? You will be monitored until you leave the hospital or clinic. This includes  checking your temperature, blood pressure, pulse, breathing rate, and blood oxygen level. Your blood may be tested to see how you have responded to the transfusion. You may be warmed with fluids or blankets. This is done to keep the temperature of your body normal. If you have your procedure in an outpatient setting, you will be told whom to contact to report any reactions. Where to find more information Visit the American Red Cross: redcross.org Summary A blood transfusion is a procedure in which you receive blood through an IV tube. The blood you are given may be made up of different blood cells. You may receive red blood cells, platelets, plasma, or white blood cells. Your temperature, blood pressure, and pulse will be checked often. After the procedure, your blood may be tested to see how you have responded. This information is not intended to replace advice given to you by your health care provider. Make sure you discuss any questions you have with your health care provider. Document Revised: 02/16/2022 Document Reviewed: 02/16/2022 Elsevier Patient Education  2024 Elsevier Inc.  

## 2023-11-14 NOTE — Telephone Encounter (Signed)
Spoke with patient. Advised to resume lasix at previous dose, continue to hold spironolactone per MD. Jeremiah [name] to monitor daily weights and BP. If weights increased, increased SOB, BP fluctuates/too low -- please contact office. He verbalized understanding.

## 2023-11-14 NOTE — Telephone Encounter (Signed)
Spoke with patient in follow up on 12/9 visit  He saw his PCP yesterday - BP was 120/69. Labs were drawn, no results yet.   He has appt for blood transfusion today -- 2U PRBCs  Weight stable at 212lbs  He reports that he is using less O2 during the day -- breathing OK  He said PCP told him to finish out course of abx and steroids  Per MD: Resume lasix.  Continue to hold spironolactone.  Concern of volume overload with blood transfusion.

## 2023-11-15 LAB — BPAM RBC
Blood Product Expiration Date: 202501052359
ISSUE DATE / TIME: 202412121419
Unit Type and Rh: 5100

## 2023-11-15 LAB — TYPE AND SCREEN
ABO/RH(D): O POS
Antibody Screen: POSITIVE
Donor AG Type: NEGATIVE
Unit division: 0

## 2023-11-19 DIAGNOSIS — D500 Iron deficiency anemia secondary to blood loss (chronic): Secondary | ICD-10-CM | POA: Diagnosis not present

## 2023-11-19 DIAGNOSIS — K551 Chronic vascular disorders of intestine: Secondary | ICD-10-CM | POA: Diagnosis not present

## 2023-11-22 DIAGNOSIS — R053 Chronic cough: Secondary | ICD-10-CM | POA: Diagnosis not present

## 2023-11-22 DIAGNOSIS — K922 Gastrointestinal hemorrhage, unspecified: Secondary | ICD-10-CM | POA: Diagnosis not present

## 2023-11-24 NOTE — Progress Notes (Unsigned)
Electrophysiology Office Note:   Date:  11/25/2023  ID:  Jeremiah [name], DOB 08/22/1946, MRN [id number]  Primary Cardiologist: Orbie Pyo, MD Primary Heart Failure: None Electrophysiologist: Will Jorja Loa, MD       History of Present Illness:   Jeremiah [name] is a 77 y.o. 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   Admitted to hospital 08/2023 s/p PPM in setting of CHB.   Since last being seen in our clinic the patient reports he does not feel great overall due to all his health issues.  He needs a procedure for his GI tract / bleeding issues but the only place that can perform it is [facility] and they are not accepting patients.  He states "the next time I bleed, I will just have to drive to the ER at [facility] and then I will be a pt there".  Notes chronic SOB, unchanged.   He denies chest pain, palpitations, dyspnea, PND, orthopnea, nausea, vomiting, dizziness, syncope, edema, weight gain, or early satiety.   Review of systems complete and found to be negative unless listed in HPI.    EP Information / Studies Reviewed:    EKG is not ordered today. EKG from 10/07/23 reviewed which showed NSR 86 bpm      PPM Interrogation-  reviewed in detail today,  See PACEART report.  Device History: Abbott Dual Chamber PPM implanted 08/23/23 for CHB  Studies:  LHC 05/2016 > patent saphenous vein grafts, native distal LM with 75% stenosis, inferior wall hypokinesis, LVEF 50% Stress Myoview 03/2020 > fixed mild apical inferior & moderate mid to basal inferolateral perfusion defect, normal wall motion in those areas suggest artifact, no ischemia  ECHO 06/2023 > LVEF 55-60%, G1DD   Arrhythmia / AAD AVNRT s/p SVT Ablation    Risk Assessment/Calculations:    CHA2DS2-VASc Score = 4   This indicates a 4.8% annual risk of stroke. The  patient's score is based upon: CHF History: 0 HTN History: 1 Diabetes History: 1 Stroke History: 0 Vascular Disease History: 0 Age Score: 2 Gender Score: 0             Physical Exam:   VS:  BP 112/60 (BP Location: Right Arm, Patient Position: Sitting, Cuff Size: Normal)   Pulse (!) 52   Resp 16   Ht 5\' 8"  (1.727 m)   Wt 209 lb (94.8 kg)   SpO2 92%   BMI 31.78 kg/m    Wt Readings from Last 3 Encounters:  11/25/23 209 lb (94.8 kg)  11/11/23 212 lb (96.2 kg)  11/05/23 209 lb 11.2 oz (95.1 kg)     GEN: Well nourished, well developed in no acute distress NECK: No JVD; No carotid bruits CARDIAC: Regular rate and rhythm, no murmurs, rubs, gallops, implant site well healed without edema/erythema or tethering RESPIRATORY:  Clear to auscultation without rales, wheezing or rhonchi  ABDOMEN: Soft, non-tender, non-distended EXTREMITIES:  No edema; No deformity   ASSESSMENT AND PLAN:    CHB s/p Abbott PPM  -Normal PPM function -See Pace Art report -No changes today -Episodes of increased ventricular rates with 1:1 AV conduction-SVT / AFL/fib, longest episode , others lasting seconds, overall <1%.  Atrial rate detection programmed at 170, VT rate detection 175 bpm (both nominal programming)  Atrial Fibrillation  SVT CHA2DS2-VASc 4  -continue ASA -  largely rate controlled, does have episodes of 1:1 AV conduction -<1% burden on device -pt asymptomatic with episodes, could consider addition of Toprol 25mg  daily for rate control but his soft normal BP's may be a rate limiting factor. Given he is not symptomatic and brief episodes, monitor for now.   Secondary Hypercoagulable State  -not on anticoagulation due to recurrent severe GIB  CAD s/p CABG VHD HLD Chronic Diastolic CHF  -euvolemic on exam  -no anginal symptoms  -follows with [name]   Hx Severe GIB  -per PCP   Disposition:   Follow up with Dr. Elberta Fortis in 12 months  Signed, Canary Brim, MSN, APRN, NP-C,  AGACNP-BC [facility] [place] [facility] - Electrophysiology  11/25/2023, 4:31 PM

## 2023-11-25 ENCOUNTER — Encounter: Payer: Self-pay | Admitting: Pulmonary Disease

## 2023-11-25 ENCOUNTER — Ambulatory Visit: Payer: Federal, State, Local not specified - PPO | Attending: Pulmonary Disease | Admitting: Pulmonary Disease

## 2023-11-25 VITALS — BP 112/60 | HR 52 | Resp 16 | Ht 68.0 in | Wt 209.0 lb

## 2023-11-25 DIAGNOSIS — Z951 Presence of aortocoronary bypass graft: Secondary | ICD-10-CM | POA: Diagnosis not present

## 2023-11-25 DIAGNOSIS — I350 Nonrheumatic aortic (valve) stenosis: Secondary | ICD-10-CM

## 2023-11-25 DIAGNOSIS — K922 Gastrointestinal hemorrhage, unspecified: Secondary | ICD-10-CM

## 2023-11-25 DIAGNOSIS — I480 Paroxysmal atrial fibrillation: Secondary | ICD-10-CM

## 2023-11-25 DIAGNOSIS — I442 Atrioventricular block, complete: Secondary | ICD-10-CM

## 2023-11-25 DIAGNOSIS — Z950 Presence of cardiac pacemaker: Secondary | ICD-10-CM

## 2023-11-25 DIAGNOSIS — I5032 Chronic diastolic (congestive) heart failure: Secondary | ICD-10-CM

## 2023-11-25 DIAGNOSIS — I342 Nonrheumatic mitral (valve) stenosis: Secondary | ICD-10-CM

## 2023-11-25 LAB — CUP PACEART REMOTE DEVICE CHECK
Battery Remaining Longevity: 127 mo
Battery Remaining Percentage: 95.5 %
Battery Voltage: 3.02 V
Brady Statistic AP VP Percent: 1.8 %
Brady Statistic AP VS Percent: 1.6 %
Brady Statistic AS VP Percent: 23 %
Brady Statistic AS VS Percent: 73 %
Brady Statistic RA Percent Paced: 2.8 %
Brady Statistic RV Percent Paced: 25 %
Date Time Interrogation Session: 20241223020015
Implantable Lead Connection Status: 753985
Implantable Lead Connection Status: 753985
Implantable Lead Implant Date: 20240920
Implantable Lead Implant Date: 20240920
Implantable Lead Location: 753859
Implantable Lead Location: 753860
Implantable Pulse Generator Implant Date: 20240920
Lead Channel Impedance Value: 450 Ohm
Lead Channel Impedance Value: 480 Ohm
Lead Channel Pacing Threshold Amplitude: 0.5 V
Lead Channel Pacing Threshold Amplitude: 0.625 V
Lead Channel Pacing Threshold Pulse Width: 0.5 ms
Lead Channel Pacing Threshold Pulse Width: 0.5 ms
Lead Channel Sensing Intrinsic Amplitude: 12 mV
Lead Channel Sensing Intrinsic Amplitude: 5 mV
Lead Channel Setting Pacing Amplitude: 0.875
Lead Channel Setting Pacing Amplitude: 1.5 V
Lead Channel Setting Pacing Pulse Width: 0.5 ms
Lead Channel Setting Sensing Sensitivity: 2 mV
Pulse Gen Model: 2272
Pulse Gen Serial Number: 8213521

## 2023-11-25 NOTE — Patient Instructions (Signed)
Medication Instructions:  Your physician recommends that you continue on your current medications as directed. Please refer to the Current Medication list given to you today.  *If you need a refill on your cardiac medications before your next appointment, please call your pharmacy*  Lab Work: None ordered If you have labs (blood work) drawn today and your tests are completely normal, you will receive your results only by: MyChart Message (if you have MyChart) OR A paper copy in the mail If you have any lab test that is abnormal or we need to change your treatment, we will call you to review the results.  Follow-Up: At [facility] you and your health needs are our priority.  As part of our continuing mission to provide you with exceptional heart care, we have created designated Provider Care Teams.  These Care Teams include your primary Cardiologist (physician) and Advanced Practice Providers (APPs -  Physician Assistants and Nurse Practitioners) who all work together to provide you with the care you need, when you need it.  Your next appointment:   1 year(s)  Provider:   Loman [place], MD or Canary Brim, NP

## 2023-11-26 ENCOUNTER — Inpatient Hospital Stay: Payer: Federal, State, Local not specified - PPO

## 2023-11-26 ENCOUNTER — Other Ambulatory Visit: Payer: Self-pay | Admitting: *Deleted

## 2023-11-26 ENCOUNTER — Telehealth: Payer: Self-pay

## 2023-11-26 DIAGNOSIS — D649 Anemia, unspecified: Secondary | ICD-10-CM

## 2023-11-26 DIAGNOSIS — D500 Iron deficiency anemia secondary to blood loss (chronic): Secondary | ICD-10-CM

## 2023-11-26 LAB — CBC WITH DIFFERENTIAL (CANCER CENTER ONLY)
Abs Immature Granulocytes: 0.03 10*3/uL (ref 0.00–0.07)
Basophils Absolute: 0 10*3/uL (ref 0.0–0.1)
Basophils Relative: 1 %
Eosinophils Absolute: 0.2 10*3/uL (ref 0.0–0.5)
Eosinophils Relative: 4 %
HCT: 24.7 % — ABNORMAL LOW (ref 39.0–52.0)
Hemoglobin: 7.2 g/dL — ABNORMAL LOW (ref 13.0–17.0)
Immature Granulocytes: 1 %
Lymphocytes Relative: 14 %
Lymphs Abs: 0.8 10*3/uL (ref 0.7–4.0)
MCH: 26.3 pg (ref 26.0–34.0)
MCHC: 29.1 g/dL — ABNORMAL LOW (ref 30.0–36.0)
MCV: 90.1 fL (ref 80.0–100.0)
Monocytes Absolute: 0.6 10*3/uL (ref 0.1–1.0)
Monocytes Relative: 11 %
Neutro Abs: 4 10*3/uL (ref 1.7–7.7)
Neutrophils Relative %: 69 %
Platelet Count: 233 10*3/uL (ref 150–400)
RBC: 2.74 MIL/uL — ABNORMAL LOW (ref 4.22–5.81)
RDW: 16.8 % — ABNORMAL HIGH (ref 11.5–15.5)
WBC Count: 5.7 10*3/uL (ref 4.0–10.5)
nRBC: 0 % (ref 0.0–0.2)

## 2023-11-26 LAB — SAMPLE TO BLOOD BANK

## 2023-11-26 LAB — PREPARE RBC (CROSSMATCH)

## 2023-11-26 NOTE — Telephone Encounter (Signed)
error 

## 2023-11-26 NOTE — Telephone Encounter (Signed)
CRITICAL VALUE STICKER  CRITICAL VALUE:  Hgb 7.2  RECEIVER (on-site recipient of call): Daneil Dolin, LPN  DATE & TIME NOTIFIED: 11/26/23 11:46  MESSENGER (representative from lab): Herbert Seta  MD NOTIFIED: Si Gaul, MD  TIME OF NOTIFICATION:11:46  RESPONSE:

## 2023-11-26 NOTE — Progress Notes (Signed)
Pt scheduled for 1 unit transfusion for 12/27 at 9am. Pt called and notified by this nurse. Pt verbalized understanding. Orders are in [facility] to release.

## 2023-11-28 DIAGNOSIS — Z7984 Long term (current) use of oral hypoglycemic drugs: Secondary | ICD-10-CM | POA: Diagnosis not present

## 2023-11-28 DIAGNOSIS — E785 Hyperlipidemia, unspecified: Secondary | ICD-10-CM | POA: Diagnosis not present

## 2023-11-28 DIAGNOSIS — I10 Essential (primary) hypertension: Secondary | ICD-10-CM | POA: Diagnosis not present

## 2023-11-28 DIAGNOSIS — E1165 Type 2 diabetes mellitus with hyperglycemia: Secondary | ICD-10-CM | POA: Diagnosis not present

## 2023-11-29 ENCOUNTER — Other Ambulatory Visit: Payer: Self-pay

## 2023-11-29 ENCOUNTER — Inpatient Hospital Stay: Payer: Federal, State, Local not specified - PPO

## 2023-11-29 VITALS — BP 108/56 | HR 73 | Temp 97.6°F | Resp 16

## 2023-11-29 DIAGNOSIS — D500 Iron deficiency anemia secondary to blood loss (chronic): Secondary | ICD-10-CM | POA: Diagnosis not present

## 2023-11-29 DIAGNOSIS — D649 Anemia, unspecified: Secondary | ICD-10-CM

## 2023-11-29 LAB — CBC WITH DIFFERENTIAL (CANCER CENTER ONLY)
Abs Immature Granulocytes: 0.02 10*3/uL (ref 0.00–0.07)
Basophils Absolute: 0 10*3/uL (ref 0.0–0.1)
Basophils Relative: 0 %
Eosinophils Absolute: 0.2 10*3/uL (ref 0.0–0.5)
Eosinophils Relative: 4 %
HCT: 21.7 % — ABNORMAL LOW (ref 39.0–52.0)
Hemoglobin: 6.4 g/dL — CL (ref 13.0–17.0)
Immature Granulocytes: 0 %
Lymphocytes Relative: 10 %
Lymphs Abs: 0.6 10*3/uL — ABNORMAL LOW (ref 0.7–4.0)
MCH: 25.9 pg — ABNORMAL LOW (ref 26.0–34.0)
MCHC: 29.5 g/dL — ABNORMAL LOW (ref 30.0–36.0)
MCV: 87.9 fL (ref 80.0–100.0)
Monocytes Absolute: 0.4 10*3/uL (ref 0.1–1.0)
Monocytes Relative: 7 %
Neutro Abs: 4.9 10*3/uL (ref 1.7–7.7)
Neutrophils Relative %: 79 %
Platelet Count: 244 10*3/uL (ref 150–400)
RBC: 2.47 MIL/uL — ABNORMAL LOW (ref 4.22–5.81)
RDW: 17.2 % — ABNORMAL HIGH (ref 11.5–15.5)
WBC Count: 6.2 10*3/uL (ref 4.0–10.5)
nRBC: 0 % (ref 0.0–0.2)

## 2023-11-29 LAB — PREPARE RBC (CROSSMATCH)

## 2023-11-29 MED ORDER — SODIUM CHLORIDE 0.9% IV SOLUTION
250.0000 mL | INTRAVENOUS | Status: DC
  Administered 2023-11-29: 250 mL via INTRAVENOUS

## 2023-11-29 MED ORDER — DIPHENHYDRAMINE HCL 25 MG PO CAPS
25.0000 mg | ORAL_CAPSULE | Freq: Once | ORAL | Status: AC
Start: 2023-11-29 — End: 2023-11-29
  Administered 2023-11-29: 25 mg via ORAL
  Filled 2023-11-29: qty 1

## 2023-11-29 NOTE — Progress Notes (Signed)
CRITICAL VALUE STICKER  CRITICAL VALUE: Hgb 6.4  RECEIVER (on-site recipient of call): Morrie Sheldon        DATE & TIME NOTIFIED: 12/27 @ 1029  MESSENGER (representative from lab): Lanora Manis   MD NOTIFIED: Dr. Arbutus Ped   TIME OF NOTIFICATION: 1033  RESPONSE: Patient is here receiving blood.  Adding on another unit of blood

## 2023-11-29 NOTE — Progress Notes (Signed)
Patient is already here receiving blood.  CBC rechecked and hgb 6.4.  Per Dr. Arbutus Ped- adding on another unit of blood.

## 2023-11-29 NOTE — Patient Instructions (Signed)

## 2023-12-02 ENCOUNTER — Other Ambulatory Visit: Payer: Self-pay | Admitting: Internal Medicine

## 2023-12-02 ENCOUNTER — Telehealth: Payer: Self-pay | Admitting: Internal Medicine

## 2023-12-02 LAB — TYPE AND SCREEN
ABO/RH(D): O POS
Antibody Screen: POSITIVE
Donor AG Type: NEGATIVE
Donor AG Type: NEGATIVE
Unit division: 0
Unit division: 0

## 2023-12-02 LAB — BPAM RBC
Blood Product Expiration Date: 202501262359
Blood Product Expiration Date: 202502012359
ISSUE DATE / TIME: 202412270958
ISSUE DATE / TIME: 202412271146
Unit Type and Rh: 5100
Unit Type and Rh: 5100

## 2023-12-02 NOTE — Telephone Encounter (Signed)
Patient is currently scheduled for a CT Scan in [place] on January 17th but needs for it to be scheduled in [place]. Please call patient with an updated location and time.

## 2023-12-02 NOTE — Telephone Encounter (Signed)
Called spoke to patient and moved appt to [facility] imaging

## 2023-12-03 ENCOUNTER — Telehealth: Payer: Self-pay | Admitting: Internal Medicine

## 2023-12-03 DIAGNOSIS — E785 Hyperlipidemia, unspecified: Secondary | ICD-10-CM

## 2023-12-03 MED ORDER — EZETIMIBE 10 MG PO TABS: 10.0000 mg | ORAL_TABLET | Freq: Every day | ORAL | 3 refills | Status: AC

## 2023-12-03 NOTE — Telephone Encounter (Signed)
Pt called in asking for a refill on Ezetimibe. It was discontinued 10/05/23 due to pt reported not taking it. He has still been taking once daily, should he continue? Please advise.

## 2023-12-03 NOTE — Telephone Encounter (Signed)
 Called and spoke to patient. Below message relayed. Zetia  10 mg ordered for 90 day supply with 3 refills. Lipid panel ordered. Patient verbalized understanding and agree. No questions at this time.  Yes .SABRA He should be taking zetia  along with atorvastatin . Please refill 10 mg daily - #90, 3 refills. Also, he needs a repeat fasting lipid profile - please order.   Dr. VEAR

## 2023-12-03 NOTE — Telephone Encounter (Signed)
 Called and spoke to patient. Verified name and DOB. Patient is requesting a refill on Zetia  10 mg to the [facility] on [address]. Medication was discontinued 11/2 while in the hospital. Patient states he has been taking it since August. Medication not on medication list. Please advise if okay to refill.

## 2023-12-05 ENCOUNTER — Encounter: Payer: Self-pay | Admitting: Pulmonary Disease

## 2023-12-06 ENCOUNTER — Other Ambulatory Visit: Payer: Self-pay | Admitting: Physician Assistant

## 2023-12-06 ENCOUNTER — Telehealth: Payer: Self-pay | Admitting: Internal Medicine

## 2023-12-06 NOTE — Telephone Encounter (Signed)
 Patient is going to the cancer center at [facility] on Tuesday and is having labs done there, he wants to know if the lab [name] wants done can be sent there or released. So they care do it there.

## 2023-12-06 NOTE — Telephone Encounter (Signed)
 Patient identification verified by 2 forms. Bertina Cooks, RN    Called and spoke to patient  Patient states:   -he will be completing labs at [facility] on 12/10/23  -would like to know if can complete Dr. Mona labs same day   -[place] uses [facility] as well  Informed patient:   -order in system for fasting Lipid on 12/31  -since [place] uses [facility] they should be able to see labs in system  -can contact [place] prior to presenting to check if labs available  Patient verbalize understanding, no questions at this time

## 2023-12-06 NOTE — Progress Notes (Signed)
 [facility] OFFICE PROGRESS NOTE  Koirala, Dibas, MD [address] [place] [address]  DIAGNOSIS: Iron  deficiency anemia secondary to chronic gastrointestinal hemorrhage   PRIOR THERAPY:  1) Iron  infusion with Ferrlecit 250 Mg IV for 4 doses. Last dose was given on 06/09/2023  2) IV iron  infusion with feraheme, last dose on 10/21/23  CURRENT THERAPY: Oral ferrous sulfate  with orange juice as well as oral vitamin B12 1000 mcg p.o. daily and blood transfusions PRN to keep Hbg >8.   INTERVAL HISTORY: Jeremiah [name] 78 y.o. male returns to the  clinic today for a follow-up visit.  The patient was last seen in the clinic in October by Dr. Sherrod.  The patient has a history of anemia secondary to GI blood loss.  His most recent capsule endoscopy was performed at [facility] where he had some areas ablated.  They referred him to [facility] for consideration of retrograde double balloon but they could not accept the patient until he had another bleeding episode.  A referral was sent to the clinic last month.  The patient has been calling for an appointment.  He is planning on calling them today  He called the clinic in November 2024 with abrupt drop in his hemoglobin and black tarry stools.  He presented to the emergency room.  He had a CTA performed without active GI or mesenteric bleeding.  GI did not recommend any repeat endoscopy.  He received 3 units of blood.  Patient is no longer on Eliquis .Per GI notes, insurance would not cover octreotide.   The patient has been receiving blood transfusions through the clinic approximately every 2 weeks or so.  His most recent blood transfusions have been around 12 11/24 and 11/29/2023.  Overall the patient is feeling fairly well today.  He can tell when his hemoglobin is low.  His cardiologist discontinued his iron  due him receiving IV iron .  His most recent IV iron  infusion was on 10/21/2023 with Feraheme.  He is compliant with his B12  supplement.  The patient does have other health problems.  The patient describes that he has claudication but he is unable to undergo vascular procedure due to needing to be on blood thinners with his anemia.  The patient is hypotensive today.  He is not symptomatic.  He does have lightheadedness sometimes but not at this time.  He states that his blood pressure was 80/40 at his cardiology office 2-1/2 weeks ago.  They recommended monitoring but did discontinue his spironolactone .  The patient states that his PCP started half a tablet of spironolactone  back although the patient did not take that this morning.     Patient denies any visible bleeding at this time.  He checks his stool frequently and states they are well-formed and a normal brown color.  Wears supplemental oxygen  at baseline due to COPD.  He is here today for evaluation repeat blood work.    MEDICAL HISTORY: Past Medical History:  Diagnosis Date   A-fib [facility])    Acute on chronic systolic (congestive) heart failure (HCC) 06/10/2017   Acute pulmonary edema (HCC)    Anemia    Aortic valve sclerosis 12/2018   Noted on ECHO   Arthritis    CAD (coronary artery disease)    a. 2006: CABG in 2006 with LIMA-LAD, SVG-OM1, and SVG-RPDA   Cardiomegaly 12/2018   Stable, noted on CXR   Carpal tunnel syndrome    Right   Chronic pain 03/21/2016  Colon cancer (HCC) 2006   COPD (chronic obstructive pulmonary disease) (HCC)    Diabetes mellitus without complication (HCC)    DVT (deep venous thrombosis) (HCC)    Right   Essential hypertension 03/21/2016   GERD (gastroesophageal reflux disease)    History of blood transfusion    History of Clostridioides difficile infection    History of prostate cancer 03/21/2016   History of PSVT (paroxysmal supraventricular tachycardia)    HLD (hyperlipidemia)    HTN (hypertension)    Hx of CABG 2006   Hypercholesteremia 03/21/2016   Hypothyroidism    LAE (left atrial enlargement) 12/2018    Severe, Noted on ECHO   LVH (left ventricular hypertrophy) 12/2018   Mild, Noted on ECHO   Medication management 03/21/2016   Mitral annular calcification 12/2018   with mild MS, Noted on ECHO   Morbid obesity (HCC) 03/21/2016   Myocardial infarct (HCC)    OSA (obstructive sleep apnea) 03/21/2016   uses oxygen  at night time   Pain in right ankle and joints of right foot 03/21/2016   Paronychia 03/21/2016   Phimosis    Pneumonia    Primary insomnia 03/21/2016   Prostate cancer (HCC) 2008   Pulmonary hypertension (HCC) 12/2018   Moderate, Noted on ECHO   Tobacco dependence 03/21/2016   Tricuspid regurgitation 12/2018   Mild, Noted on ECHO    ALLERGIES:  is allergic to shellfish allergy and fluzone [influenza virus vaccine].  MEDICATIONS:  Current Outpatient Medications  Medication Sig Dispense Refill   acetaminophen  (TYLENOL ) 650 MG CR tablet Take 1,300 mg by mouth every 8 (eight) hours as needed for pain.     aspirin  EC 81 MG tablet Take 1 tablet (81 mg total) by mouth daily. Swallow whole. 90 tablet 3   atorvastatin  (LIPITOR ) 80 MG tablet Take 80 mg by mouth daily.     budesonide  (PULMICORT ) 0.5 MG/2ML nebulizer solution Take 2 mLs (0.5 mg total) by nebulization 2 (two) times daily. 120 mL 11   cetirizine  (ZYRTEC ) 10 MG tablet Take 10 mg by mouth daily.     colchicine  0.6 MG tablet Take 0.6 mg by mouth daily as needed (Gout).     dicyclomine  (BENTYL ) 10 MG capsule Take 10 mg by mouth 3 (three) times daily before meals. 30 minutes before eating     doxycycline (VIBRA-TABS) 100 MG tablet Take 100 mg by mouth daily.     ezetimibe  (ZETIA ) 10 MG tablet Take 1 tablet (10 mg total) by mouth daily. 90 tablet 3   fluticasone  (FLONASE ) 50 MCG/ACT nasal spray SHAKE LIQUID AND USE 1 SPRAY IN EACH NOSTRIL DAILY 16 g 2   furosemide  (LASIX ) 40 MG tablet Take 2 tablets (80 mg total) by mouth daily. (Patient taking differently: Take 40 mg by mouth daily.) 30 tablet 0   glimepiride (AMARYL) 2 MG  tablet Take 2 mg by mouth every morning.     guaiFENesin-codeine  100-10 MG/5ML syrup Take 5 mLs by mouth every 6 (six) hours as needed.     ketoconazole  (NIZORAL ) 2 % shampoo Apply 1 Application topically daily.     levothyroxine  (SYNTHROID , LEVOTHROID) 88 MCG tablet Take 88 mcg by mouth daily before breakfast. Take on an empty stomach     Potassium Chloride  ER 20 MEQ TBCR Take 1 tablet by mouth daily.     revefenacin  (YUPELRI ) 175 MCG/3ML nebulizer solution Take 175 mcg by nebulization in the morning and at bedtime.     spironolactone  (ALDACTONE ) 25 MG tablet Take 1  tablet (25 mg total) by mouth daily. 30 tablet 0   No current facility-administered medications for this visit.    SURGICAL HISTORY:  Past Surgical History:  Procedure Laterality Date   ANKLE SURGERY Right 12/2013   CARDIAC CATHETERIZATION N/A 05/08/2016   Procedure: Left Heart Cath and Cors/Grafts Angiography;  Surgeon: Victory LELON Sharps, MD;  Location: [facility] INVASIVE CV LAB;  Service: Cardiovascular;  Laterality: N/A;   CIRCUMCISION N/A 10/07/2019   Procedure: CIRCUMCISION ADULT;  Surgeon: Cam Morene LELON, MD;  Location: [facility] ORS;  Service: Urology;  Laterality: N/A;   CORONARY ARTERY BYPASS GRAFT  2006   x3   ELECTROPHYSIOLOGIC STUDY N/A 08/24/2016   Procedure: SVT Ablation;  Surgeon: Will Gladis Norton, MD;  Location: [facility] INVASIVE CV LAB;  Service: Cardiovascular;  Laterality: N/A;   PACEMAKER IMPLANT N/A 08/23/2023   Procedure: PACEMAKER IMPLANT;  Surgeon: Norton Soyla Gladis, MD;  Location: [facility] INVASIVE CV LAB;  Service: Cardiovascular;  Laterality: N/A;   PROSTATE SURGERY  2008   PTCA     UMBILICAL HERNIA REPAIR N/A 05/26/2022   Procedure: PRIMARY REPAIR OF STRANGULATED UMBILICAL HERNIA WITH PARTIAL OMENTECTOMY;  Surgeon: Aron Shoulders, MD;  Location: [facility] OR;  Service: General;  Laterality: N/A;    REVIEW OF SYSTEMS:   Review of Systems  Constitutional: Positive for improved fatigue. Negative for appetite change, chills,   fever and unexpected weight change.  HENT: Negative for mouth sores, nosebleeds, sore throat and trouble swallowing.   Eyes: Negative for eye problems and icterus.  Respiratory: Positive for baseline dyspnea on exertion. Negative for cough, hemoptysis, and wheezing.   Cardiovascular: Negative for chest pain and leg swelling.  Gastrointestinal: Negative for abdominal pain, constipation, diarrhea, nausea and vomiting.  Genitourinary: Negative for bladder incontinence, difficulty urinating, dysuria, frequency and hematuria.   Musculoskeletal: Positive for claudication with walking distances. Negative for back pain, gait problem, neck pain and neck stiffness.  Skin: Negative for itching and rash.  Neurological: Positive for occasional lightheadedness. Negative for dizziness, extremity weakness, gait problem, headaches, and seizures.  Hematological: Negative for adenopathy. Does not bruise/bleed easily.  Psychiatric/Behavioral: Negative for confusion, depression and sleep disturbance. The patient is not nervous/anxious.     PHYSICAL EXAMINATION:  Blood pressure (!) 84/54, pulse (!) 103, temperature 97.6 F (36.4 C), temperature source Temporal, resp. rate 17, weight 204 lb 8 oz (92.8 kg), SpO2 97%.  ECOG PERFORMANCE STATUS: 1  Physical Exam  Constitutional: Oriented to person, place, and time and well-developed, well-nourished, and in no distress.  HENT:  Head: Normocephalic and atraumatic.  Mouth/Throat: Oropharynx is clear and moist. No oropharyngeal exudate.  Eyes: Conjunctivae are normal. Right eye exhibits no discharge. Left eye exhibits no discharge. No scleral icterus.  Neck: Normal range of motion. Neck supple.  Cardiovascular: Normal rate, regular rhythm, normal heart sounds and intact distal pulses.   Pulmonary/Chest: Effort normal. Quiet breath sounds bilaterally. No respiratory distress. No wheezes. No rales.  Abdominal: Soft. Bowel sounds are normal. Exhibits no distension and no  mass. There is no tenderness.  Musculoskeletal: Normal range of motion. Exhibits no edema.  Lymphadenopathy:    No cervical adenopathy.  Neurological: Alert and oriented to person, place, and time. Exhibits normal muscle tone. Gait normal. Coordination normal.  Skin: Skin is warm and dry. No rash noted. Not diaphoretic. No erythema. No pallor.  Psychiatric: Mood, memory and judgment normal.  Vitals reviewed.  LABORATORY DATA: Lab Results  Component Value Date   WBC 4.1 12/10/2023   HGB 8.9 (  L) 12/10/2023   HCT 30.1 (L) 12/10/2023   MCV 84.1 12/10/2023   PLT 264 12/10/2023      Chemistry      Component Value Date/Time   NA 138 11/12/2023 1027   K 4.3 11/12/2023 1027   CL 97 11/12/2023 1027   CO2 25 11/12/2023 1027   BUN 19 11/12/2023 1027   CREATININE 0.86 11/12/2023 1027   CREATININE 0.92 06/27/2023 1342      Component Value Date/Time   CALCIUM  9.0 11/12/2023 1027   ALKPHOS 60 10/05/2023 0636   AST 17 10/05/2023 0636   AST 19 06/27/2023 1342   ALT 17 10/05/2023 0636   ALT 19 06/27/2023 1342   BILITOT 0.7 10/05/2023 0636   BILITOT 0.3 06/27/2023 1342       RADIOGRAPHIC STUDIES:  CUP PACEART REMOTE DEVICE CHECK Result Date: 11/25/2023 Pacemaker check in clinic. Normal device function. Thresholds, sensing, impedance's consistent with previous measurements. Device programmed to maximize longevity. Episodes of increased ventricular rates with 1:1 AV conduction, SVT / AFL/fib, longest episode , others lasting seconds, overall <1%.  Atrial rate detection programmed at 170, VT rate detection 175 bpm.  Device programmed at appropriate safety margins. Histogram distribution appropriate for patient activity level. Device programmed to  optimize intrinsic conduction. Estimated longevity 10 year, 10 mo. Patient enrolled in remote follow-up. Patient education completed.    ASSESSMENT/PLAN:  This is a very pleasant 78 year old Caucasian male with iron  deficiency anemia  secondary to presumed GI blood loss.  He is receiving frequent blood transfusions.  He also receives IV iron  infusions on an as-needed basis the most recent and October 21, 2023 with Feraheme.  Labs were reviewed today.  His hemoglobin is 8.9. He does not need a blood transfusion today.  He would consider blood transfusion if his hemoglobin were less than 8.  However the patient has been requiring blood transfusions approximately every 2 weeks or so.  I offered to check the patient's labs on a weekly basis.  The patient declined and would prefer to monitor his labs every 2 weeks.  The patient states he is able to tell when his hemoglobin is low and knows he is always welcome to call us  sooner for same-day lab appointment.  I will place standing orders for blood bank hold's.  His iron  studies are pending from today.  I will call him today or tomorrow and let him know if he requires any additional IV iron .  Hopefully the patient will be able to see [facility] gastroenterology considering the patient has been requiring frequent blood transfusions.  He is going to call them today to see if he can get an upcoming appointment.  Additionally, his anemia is prohibiting him from being able to undergo needed procedures for his other health problems such as claudication  The patient's blood pressure is low today.  He was told to hold his spironolactone  today.  He also states his cardiologist recommended holding his spironolactone  at his last appointment which I recommend he follow the advice of his cardiologist.  We will give the patient food and some IV fluids today to see if we can improve his hypotension.  Looks like his last ejection fraction was 55 to 60%.  We will also call his cardiologist office since he has had persistent hypotension since he was last seen by their office to see if there is any additional recommendations that they would offer for him. The patient will continue to monitor his BP at home. He is  not  symptomatic from his hypotension at this time.   Discussed symptoms to seek immediate care including overt bleeding, chest pain, worsening shortness of breath, syncopal symptoms   The patient was advised to call immediately if she has any concerning symptoms in the interval. The patient voices understanding of current disease status and treatment options and is in agreement with the current care plan. All questions were answered. The patient knows to call the clinic with any problems, questions or concerns. We can certainly see the patient much sooner if necessary   Orders Placed This Encounter  Procedures   CBC with Differential (Cancer Center Only)    Standing Status:   Standing    Number of Occurrences:   10    Expiration Date:   12/09/2024   Iron  and Iron  Binding Capacity ([facility] only)    Standing Status:   Standing    Number of Occurrences:   2    Expiration Date:   12/09/2024   Ferritin    Standing Status:   Standing    Number of Occurrences:   2    Expiration Date:   12/09/2024   Sample to Blood Bank    Standing Status:   Standing    Number of Occurrences:   10    Expiration Date:   12/09/2024    The total time spent in the appointment was 30-39 minutes  Krislynn Gronau L Kadiatou Oplinger, PA-C 12/10/23

## 2023-12-10 ENCOUNTER — Encounter: Payer: Self-pay | Admitting: Physician Assistant

## 2023-12-10 ENCOUNTER — Inpatient Hospital Stay: Payer: Federal, State, Local not specified - PPO | Admitting: Physician Assistant

## 2023-12-10 ENCOUNTER — Telehealth: Payer: Self-pay | Admitting: Physician Assistant

## 2023-12-10 ENCOUNTER — Inpatient Hospital Stay: Payer: Federal, State, Local not specified - PPO | Attending: Physician Assistant

## 2023-12-10 ENCOUNTER — Inpatient Hospital Stay: Payer: BC Managed Care – PPO

## 2023-12-10 VITALS — BP 84/54 | HR 103 | Temp 97.6°F | Resp 17 | Wt 204.5 lb

## 2023-12-10 DIAGNOSIS — K922 Gastrointestinal hemorrhage, unspecified: Secondary | ICD-10-CM | POA: Diagnosis not present

## 2023-12-10 DIAGNOSIS — D649 Anemia, unspecified: Secondary | ICD-10-CM

## 2023-12-10 DIAGNOSIS — J449 Chronic obstructive pulmonary disease, unspecified: Secondary | ICD-10-CM | POA: Insufficient documentation

## 2023-12-10 DIAGNOSIS — Z85038 Personal history of other malignant neoplasm of large intestine: Secondary | ICD-10-CM | POA: Diagnosis not present

## 2023-12-10 DIAGNOSIS — I959 Hypotension, unspecified: Secondary | ICD-10-CM | POA: Insufficient documentation

## 2023-12-10 DIAGNOSIS — D500 Iron deficiency anemia secondary to blood loss (chronic): Secondary | ICD-10-CM

## 2023-12-10 DIAGNOSIS — Z9981 Dependence on supplemental oxygen: Secondary | ICD-10-CM | POA: Insufficient documentation

## 2023-12-10 DIAGNOSIS — Z8546 Personal history of malignant neoplasm of prostate: Secondary | ICD-10-CM | POA: Diagnosis not present

## 2023-12-10 LAB — CBC WITH DIFFERENTIAL (CANCER CENTER ONLY)
Abs Immature Granulocytes: 0.04 10*3/uL (ref 0.00–0.07)
Basophils Absolute: 0.1 10*3/uL (ref 0.0–0.1)
Basophils Relative: 1 %
Eosinophils Absolute: 0.2 10*3/uL (ref 0.0–0.5)
Eosinophils Relative: 4 %
HCT: 30.1 % — ABNORMAL LOW (ref 39.0–52.0)
Hemoglobin: 8.9 g/dL — ABNORMAL LOW (ref 13.0–17.0)
Immature Granulocytes: 1 %
Lymphocytes Relative: 19 %
Lymphs Abs: 0.8 10*3/uL (ref 0.7–4.0)
MCH: 24.9 pg — ABNORMAL LOW (ref 26.0–34.0)
MCHC: 29.6 g/dL — ABNORMAL LOW (ref 30.0–36.0)
MCV: 84.1 fL (ref 80.0–100.0)
Monocytes Absolute: 0.5 10*3/uL (ref 0.1–1.0)
Monocytes Relative: 11 %
Neutro Abs: 2.6 10*3/uL (ref 1.7–7.7)
Neutrophils Relative %: 64 %
Platelet Count: 264 10*3/uL (ref 150–400)
RBC: 3.58 MIL/uL — ABNORMAL LOW (ref 4.22–5.81)
RDW: 17.2 % — ABNORMAL HIGH (ref 11.5–15.5)
WBC Count: 4.1 10*3/uL (ref 4.0–10.5)
nRBC: 0 % (ref 0.0–0.2)

## 2023-12-10 LAB — IRON AND IRON BINDING CAPACITY (CC-WL,HP ONLY)
Iron: 24 ug/dL — ABNORMAL LOW (ref 45–182)
Saturation Ratios: 5 % — ABNORMAL LOW (ref 17.9–39.5)
TIBC: 501 ug/dL — ABNORMAL HIGH (ref 250–450)
UIBC: 477 ug/dL — ABNORMAL HIGH (ref 117–376)

## 2023-12-10 LAB — FERRITIN: Ferritin: 13 ng/mL — ABNORMAL LOW (ref 24–336)

## 2023-12-10 LAB — FOLATE: Folate: 18.2 ng/mL (ref >5.9)

## 2023-12-10 LAB — SAMPLE TO BLOOD BANK

## 2023-12-10 LAB — VITAMIN B12: Vitamin B-12: 785 pg/mL (ref 180–914)

## 2023-12-10 MED ORDER — SODIUM CHLORIDE 0.9 % IV SOLN: Freq: Once | INTRAVENOUS | Status: AC

## 2023-12-10 NOTE — Patient Instructions (Signed)

## 2023-12-10 NOTE — Telephone Encounter (Signed)
 I called the patient to review his iron studies. I left a voicemail asking for a call back. I also will send him a mychart message. I was letting him know he needs to resume taking his oral iron supplement. Additionally, I will arrange IV iron infusion.

## 2023-12-11 ENCOUNTER — Other Ambulatory Visit: Payer: Self-pay | Admitting: Physician Assistant

## 2023-12-11 ENCOUNTER — Other Ambulatory Visit: Payer: Self-pay | Admitting: Internal Medicine

## 2023-12-11 ENCOUNTER — Telehealth: Payer: Self-pay

## 2023-12-11 ENCOUNTER — Telehealth: Payer: Self-pay | Admitting: Medical Oncology

## 2023-12-11 NOTE — Telephone Encounter (Signed)
 Jeremiah [name], patient will be scheduled as soon as possible.  Auth Submission: NO AUTH NEEDED Site of care: Site of care: [facility] Payer: [employer] Medication & CPT/J Code(s) submitted: Feraheme (ferumoxytol ) [id number] Route of submission (phone, fax, portal):  Phone # Fax # Auth type: Buy/Bill PB Units/visits requested: 510mg  x 2 doses Reference number:  Approval from: 12/11/23 to 06/01/24

## 2023-12-11 NOTE — Telephone Encounter (Signed)
 He received Jeremiah [name]' message about resuming his iron supplement. Pt is ok with getting  his iron at [facility]. HE received it there before and he understands the type of iron ordered will be based on insurance authorization.

## 2023-12-12 ENCOUNTER — Telehealth: Payer: Self-pay | Admitting: Physician Assistant

## 2023-12-13 ENCOUNTER — Telehealth: Payer: Self-pay | Admitting: Internal Medicine

## 2023-12-13 ENCOUNTER — Encounter: Payer: Self-pay | Admitting: Internal Medicine

## 2023-12-13 ENCOUNTER — Ambulatory Visit: Payer: BC Managed Care – PPO

## 2023-12-13 NOTE — Telephone Encounter (Signed)
 Patient is calling in regarding his labs. Please advise

## 2023-12-13 NOTE — Telephone Encounter (Signed)
 Called and spoke to patient. Verified name and DOB. Patient is calling for lab results done 1/7. Informed patient labs are back but has not been reviewed by the provider. Patient was told someone will call him back after Dr Mona reviews them someone will call him back. He also stated that Dr Sherrod wants to put him back on an iron  supplement and do iron  infusions. He states he has concerns and thinks this is over kill. He would like to see what Dr Mona think about Dr Jeannett recommendations. Please advise.

## 2023-12-17 ENCOUNTER — Telehealth: Payer: Self-pay | Admitting: Cardiovascular Disease

## 2023-12-17 ENCOUNTER — Ambulatory Visit (INDEPENDENT_AMBULATORY_CARE_PROVIDER_SITE_OTHER): Payer: BC Managed Care – PPO

## 2023-12-17 VITALS — BP 120/65 | HR 82 | Temp 97.9°F | Resp 20 | Ht 68.0 in | Wt 204.6 lb

## 2023-12-17 DIAGNOSIS — D509 Iron deficiency anemia, unspecified: Secondary | ICD-10-CM

## 2023-12-17 MED ORDER — DIPHENHYDRAMINE HCL 25 MG PO CAPS
25.0000 mg | ORAL_CAPSULE | Freq: Once | ORAL | Status: AC
Start: 2023-12-17 — End: 2023-12-17
  Administered 2023-12-17: 25 mg via ORAL
  Filled 2023-12-17: qty 1

## 2023-12-17 MED ORDER — SODIUM CHLORIDE 0.9 % IV SOLN
510.0000 mg | Freq: Once | INTRAVENOUS | Status: AC
  Administered 2023-12-17: 510 mg via INTRAVENOUS
  Filled 2023-12-17: qty 17

## 2023-12-17 MED ORDER — ACETAMINOPHEN 325 MG PO TABS
650.0000 mg | ORAL_TABLET | Freq: Once | ORAL | Status: AC
  Administered 2023-12-17: 650 mg via ORAL
  Filled 2023-12-17: qty 2

## 2023-12-17 NOTE — Telephone Encounter (Signed)
 Patient said that he is getting blood work done tomorrow and want to add a hemoglobin blood order as well for tomorrow

## 2023-12-17 NOTE — Telephone Encounter (Signed)
 Called and spoke to patient. Verified name and DOB. Informed patient hemoglobin is already ordered in with CBC. Patient verbalized understanding and agree. He will come tomorrow to have labs drawn.

## 2023-12-17 NOTE — Telephone Encounter (Signed)
 MD has messaged patient. Lipid Panel was not completed with last labs. Patient informed via MyChart this will need to be done

## 2023-12-17 NOTE — Progress Notes (Signed)
 Diagnosis: Iron  Deficiency Anemia  Provider:  Praveen Mannam MD  Procedure: IV Infusion  IV Type: Peripheral, IV Location: L Forearm  Feraheme (Ferumoxytol ), Dose: 510 mg  Infusion Start Time: 1440  Infusion Stop Time: 1457  Post Infusion IV Care: Observation period completed and Peripheral IV Discontinued  Discharge: Condition: Good, Destination: Home . AVS Declined  Performed by:  Maximiano JONELLE Pouch, LPN

## 2023-12-18 ENCOUNTER — Inpatient Hospital Stay: Payer: Federal, State, Local not specified - PPO

## 2023-12-18 ENCOUNTER — Encounter (HOSPITAL_COMMUNITY): Payer: Self-pay

## 2023-12-18 ENCOUNTER — Emergency Department (HOSPITAL_COMMUNITY)
Admission: EM | Admit: 2023-12-18 | Discharge: 2023-12-19 | Disposition: A | Discharge status: Home / Self Care | Payer: Federal, State, Local not specified - PPO | Attending: Emergency Medicine | Admitting: Emergency Medicine

## 2023-12-18 ENCOUNTER — Telehealth: Payer: Self-pay | Admitting: Medical Oncology

## 2023-12-18 ENCOUNTER — Other Ambulatory Visit: Payer: Self-pay | Admitting: Medical Oncology

## 2023-12-18 ENCOUNTER — Inpatient Hospital Stay (HOSPITAL_BASED_OUTPATIENT_CLINIC_OR_DEPARTMENT_OTHER): Payer: Federal, State, Local not specified - PPO | Admitting: Physician Assistant

## 2023-12-18 ENCOUNTER — Other Ambulatory Visit: Payer: Self-pay

## 2023-12-18 VITALS — BP 115/58 | HR 64 | Temp 97.6°F | Resp 20

## 2023-12-18 DIAGNOSIS — D649 Anemia, unspecified: Secondary | ICD-10-CM

## 2023-12-18 DIAGNOSIS — Z7951 Long term (current) use of inhaled steroids: Secondary | ICD-10-CM | POA: Diagnosis not present

## 2023-12-18 DIAGNOSIS — Z951 Presence of aortocoronary bypass graft: Secondary | ICD-10-CM | POA: Insufficient documentation

## 2023-12-18 DIAGNOSIS — I2510 Atherosclerotic heart disease of native coronary artery without angina pectoris: Secondary | ICD-10-CM | POA: Insufficient documentation

## 2023-12-18 DIAGNOSIS — J449 Chronic obstructive pulmonary disease, unspecified: Secondary | ICD-10-CM | POA: Diagnosis not present

## 2023-12-18 DIAGNOSIS — R42 Dizziness and giddiness: Secondary | ICD-10-CM

## 2023-12-18 DIAGNOSIS — R112 Nausea with vomiting, unspecified: Secondary | ICD-10-CM | POA: Diagnosis not present

## 2023-12-18 DIAGNOSIS — Z7982 Long term (current) use of aspirin: Secondary | ICD-10-CM | POA: Diagnosis not present

## 2023-12-18 DIAGNOSIS — E119 Type 2 diabetes mellitus without complications: Secondary | ICD-10-CM | POA: Diagnosis not present

## 2023-12-18 DIAGNOSIS — Z85038 Personal history of other malignant neoplasm of large intestine: Secondary | ICD-10-CM | POA: Diagnosis not present

## 2023-12-18 DIAGNOSIS — Z8546 Personal history of malignant neoplasm of prostate: Secondary | ICD-10-CM | POA: Insufficient documentation

## 2023-12-18 DIAGNOSIS — E039 Hypothyroidism, unspecified: Secondary | ICD-10-CM | POA: Diagnosis not present

## 2023-12-18 DIAGNOSIS — F17200 Nicotine dependence, unspecified, uncomplicated: Secondary | ICD-10-CM | POA: Diagnosis not present

## 2023-12-18 DIAGNOSIS — I7090 Unspecified atherosclerosis: Secondary | ICD-10-CM | POA: Diagnosis not present

## 2023-12-18 DIAGNOSIS — Z7984 Long term (current) use of oral hypoglycemic drugs: Secondary | ICD-10-CM | POA: Insufficient documentation

## 2023-12-18 DIAGNOSIS — I5023 Acute on chronic systolic (congestive) heart failure: Secondary | ICD-10-CM | POA: Insufficient documentation

## 2023-12-18 LAB — COMPREHENSIVE METABOLIC PANEL
ALT: 17 U/L (ref 0–44)
AST: 40 U/L (ref 15–41)
Albumin: 4.5 g/dL (ref 3.5–5.0)
Alkaline Phosphatase: 63 U/L (ref 38–126)
Anion gap: 12 (ref 5–15)
BUN: 23 mg/dL (ref 8–23)
CO2: 27 mmol/L (ref 22–32)
Calcium: 9.3 mg/dL (ref 8.9–10.3)
Chloride: 96 mmol/L — ABNORMAL LOW (ref 98–111)
Creatinine, Ser: 0.94 mg/dL (ref 0.61–1.24)
GFR, Estimated: >60 mL/min (ref >60)
Glucose, Bld: 159 mg/dL — ABNORMAL HIGH (ref 70–99)
Potassium: 4.6 mmol/L (ref 3.5–5.1)
Sodium: 135 mmol/L (ref 135–145)
Total Bilirubin: 1.5 mg/dL — ABNORMAL HIGH (ref 0.0–1.2)
Total Protein: 8.3 g/dL — ABNORMAL HIGH (ref 6.5–8.1)

## 2023-12-18 LAB — CBC
HCT: 28.3 % — ABNORMAL LOW (ref 39.0–52.0)
Hemoglobin: 8 g/dL — ABNORMAL LOW (ref 13.0–17.0)
MCH: 23.6 pg — ABNORMAL LOW (ref 26.0–34.0)
MCHC: 28.3 g/dL — ABNORMAL LOW (ref 30.0–36.0)
MCV: 83.5 fL (ref 80.0–100.0)
Platelets: 262 10*3/uL (ref 150–400)
RBC: 3.39 MIL/uL — ABNORMAL LOW (ref 4.22–5.81)
RDW: 18 % — ABNORMAL HIGH (ref 11.5–15.5)
WBC: 5.1 10*3/uL (ref 4.0–10.5)
nRBC: 0 % (ref 0.0–0.2)

## 2023-12-18 LAB — CBC WITH DIFFERENTIAL (CANCER CENTER ONLY)
Abs Immature Granulocytes: 0.03 10*3/uL (ref 0.00–0.07)
Basophils Absolute: 0 10*3/uL (ref 0.0–0.1)
Basophils Relative: 1 %
Eosinophils Absolute: 0.1 10*3/uL (ref 0.0–0.5)
Eosinophils Relative: 2 %
HCT: 26.5 % — ABNORMAL LOW (ref 39.0–52.0)
Hemoglobin: 7.9 g/dL — ABNORMAL LOW (ref 13.0–17.0)
Immature Granulocytes: 1 %
Lymphocytes Relative: 18 %
Lymphs Abs: 0.9 10*3/uL (ref 0.7–4.0)
MCH: 24.3 pg — ABNORMAL LOW (ref 26.0–34.0)
MCHC: 29.8 g/dL — ABNORMAL LOW (ref 30.0–36.0)
MCV: 81.5 fL (ref 80.0–100.0)
Monocytes Absolute: 0.5 10*3/uL (ref 0.1–1.0)
Monocytes Relative: 10 %
Neutro Abs: 3.3 10*3/uL (ref 1.7–7.7)
Neutrophils Relative %: 68 %
Platelet Count: 239 10*3/uL (ref 150–400)
RBC: 3.25 MIL/uL — ABNORMAL LOW (ref 4.22–5.81)
RDW: 17.9 % — ABNORMAL HIGH (ref 11.5–15.5)
WBC Count: 4.8 10*3/uL (ref 4.0–10.5)
nRBC: 0 % (ref 0.0–0.2)

## 2023-12-18 LAB — CMP (CANCER CENTER ONLY)
ALT: 12 U/L (ref 0–44)
AST: 15 U/L (ref 15–41)
Albumin: 4.4 g/dL (ref 3.5–5.0)
Alkaline Phosphatase: 66 U/L (ref 38–126)
Anion gap: 10 (ref 5–15)
BUN: 20 mg/dL (ref 8–23)
CO2: 30 mmol/L (ref 22–32)
Calcium: 9.8 mg/dL (ref 8.9–10.3)
Chloride: 96 mmol/L — ABNORMAL LOW (ref 98–111)
Creatinine: 0.98 mg/dL (ref 0.61–1.24)
GFR, Estimated: >60 mL/min (ref >60)
Glucose, Bld: 174 mg/dL — ABNORMAL HIGH (ref 70–99)
Potassium: 3.7 mmol/L (ref 3.5–5.1)
Sodium: 136 mmol/L (ref 135–145)
Total Bilirubin: 0.5 mg/dL (ref 0.0–1.2)
Total Protein: 7.6 g/dL (ref 6.5–8.1)

## 2023-12-18 LAB — LIPASE, BLOOD: Lipase: 32 U/L (ref 11–51)

## 2023-12-18 LAB — SAMPLE TO BLOOD BANK

## 2023-12-18 LAB — PREPARE RBC (CROSSMATCH)

## 2023-12-18 MED ORDER — SODIUM CHLORIDE 0.9 % IV SOLN: Freq: Once | INTRAVENOUS | Status: AC

## 2023-12-18 MED ORDER — ONDANSETRON HCL 4 MG/2ML IJ SOLN
4.0000 mg | Freq: Once | INTRAMUSCULAR | Status: DC
Start: 2023-12-18 — End: 2023-12-18

## 2023-12-18 MED ORDER — ONDANSETRON HCL 4 MG/2ML IJ SOLN
4.0000 mg | Freq: Once | INTRAMUSCULAR | Status: AC
  Administered 2023-12-18: 4 mg via INTRAVENOUS
  Filled 2023-12-18: qty 2

## 2023-12-18 NOTE — Telephone Encounter (Signed)
 Reaction 15 mins after feraheme infusion yesterday . He was driving his car and became light headed, pulled over to the side of the road and vomited.  He said he increased his oxygen  to 6 liters. No fever, chills or back pain. Denied acute sob, swelling, rash , itching.  He called EMS last night , he was checked out and " fairly stable ".  He was told the ED was very busy and he may have to wait . [place] decided to stay home.   Today he still feels weak and is now having to use his walker. He reported his BP today = 120/70  This was his second dose of feraheme.

## 2023-12-18 NOTE — Progress Notes (Unsigned)
 Lab orders entered

## 2023-12-18 NOTE — ED Notes (Signed)
 Pt placed on 4L [place] O2 which is baseline for HS per Pt.

## 2023-12-18 NOTE — Patient Instructions (Signed)

## 2023-12-18 NOTE — ED Triage Notes (Signed)
 N/v and dizziness after  iron  infusion yesterday. 250mL NS and 4mg  Zofran  admin at [facility] center w/o improvement.  Unable to ambulate and ambulatory at baseline.  Hx chf Hemoglobin 7.9. blood transfusion scheduled for tomorrow 2lpm [place] baseline

## 2023-12-18 NOTE — Progress Notes (Signed)
 Symptom Management Consult Note [facility]    Patient Care Team: Jeremiah Pinal, MD as PCP - General (Family Medicine) Jeremiah Pump, MD as PCP - Electrophysiology (Cardiology) [name], Jeremiah K, MD as PCP - Cardiology (Cardiology)    Name / MRN / DOB: Jeremiah [name]  [id number]  Jan 18, 1946   Date of visit: 12/18/2023   Chief Complaint/Reason for visit: nausea with vomiting and dizziness    ASSESSMENT & PLAN: Patient is a 78 y.o. male with heme/onc history of iron  deficiency anemia secondary to chronic gastrointestinal hemorrhage followed by Dr. Marguerita [name].  I have viewed most recent oncology note and lab work.    #Iron  deficiency anemia secondary to chronic gastrointestinal hemorrhage  - Next appointment with oncologist is 01/08/24   #Symptom management: Nausea and vomiting, dizziness -Patient is afebrile and normotensive.  He does appear pale.  Clear lung exam and benign abdomen.  Patient with unsteady gait and unable to ambulate, otherwise neuroexam is normal. -Patient given 250 mL normal saline and 4 mg IV Zofran .  Patient has history of CHF and was worried about receiving IV fluid today however agrees to trying the small amount. -CBC shows hemoglobin is 7.9 which is a point lower than it was x 8 days ago. Patient denies any active bleeding. CMP is overall unremarkable. -On reassessment patient symptoms have not improved.  He is still unable to ambulate.  Plan was to bring him back to clinic tomorrow for a blood transfusion however with these acute concerning symptoms and no improvement with interventions attempted further workup is recommended in the emergency department.  Patient and spouse agree with this plan.  Patient taken to ED and report given to accepting RN.   Heme/Onc History: Oncology History   No history exists.      Interval history-: Discussed the use of AI scribe software for clinical note transcription with the patient, who gave verbal  consent to proceed.  Jeremiah [name] is a 78 y.o. male with pertinent a history of chronic obstructive pulmonary disease (COPD), atrial fibrillation (AFib) not on anticoagulation, and anemia presenting to the [facility] Management [facility] today with chief complaint of dizziness and nausea with vomiting. Patient is accompanied by spouse who provides additional history.  Patient reports symptoms of dizziness, nausea, and vomiting following an iron  infusion yesterday. The patient reported feeling "woozy" and nauseas after the infusion, which progressed to severe dizziness and lethargy upon reaching home. He increased his oxygen  level was increased from the usual 2 to 6, which provided some relief. However, the patient experienced persistent lightheadedness and loss of equilibrium, particularly upon standing. The patient also reported 5 episodes of vomiting since symptom onset. The patient also reported a loose, light-colored bowel movement earlier today.  The patient denied any abdominal pain, fever, recent travel, or exposure to sick individuals. The patient also denied any new medications, and reported compliance with his current regimen, which includes Lasix . The patient's symptoms are new and have never occurred before, even with previous iron  infusions. The patient's hemoglobin levels have dropped slightly, but the patient denied any correlation between the drop in hemoglobin and the onset of dizziness. This feels different than his typical anemia symptoms.  The patient's symptoms have significantly impacted his mobility, necessitating the use of a walker. The patient denied any recent falls, muscle aches, or body pain, but reported a mild throbbing headache that was relieved with Tylenol  earlier today.  ROS  All other systems are reviewed and are negative  for acute change except as noted in the HPI.    Allergies  Allergen Reactions   Shellfish Allergy Shortness Of Breath, Nausea And Vomiting and Rash     Scallops     Fluzone [Influenza Virus Vaccine] Nausea And Vomiting, Palpitations and Rash     Past Medical History:  Diagnosis Date   A-fib (HCC)    Acute on chronic systolic (congestive) heart failure (HCC) 06/10/2017   Acute pulmonary edema (HCC)    Anemia    Aortic valve sclerosis 12/2018   Noted on ECHO   Arthritis    CAD (coronary artery disease)    a. 2006: CABG in 2006 with LIMA-LAD, SVG-OM1, and SVG-RPDA   Cardiomegaly 12/2018   Stable, noted on CXR   Carpal tunnel syndrome    Right   Chronic pain 03/21/2016   Colon cancer (HCC) 2006   COPD (chronic obstructive pulmonary disease) (HCC)    Diabetes mellitus without complication (HCC)    DVT (deep venous thrombosis) (HCC)    Right   Essential hypertension 03/21/2016   GERD (gastroesophageal reflux disease)    History of blood transfusion    History of Clostridioides difficile infection    History of prostate cancer 03/21/2016   History of PSVT (paroxysmal supraventricular tachycardia)    HLD (hyperlipidemia)    HTN (hypertension)    Hx of CABG 2006   Hypercholesteremia 03/21/2016   Hypothyroidism    LAE (left atrial enlargement) 12/2018   Severe, Noted on ECHO   LVH (left ventricular hypertrophy) 12/2018   Mild, Noted on ECHO   Medication management 03/21/2016   Mitral annular calcification 12/2018   with mild MS, Noted on ECHO   Morbid obesity (HCC) 03/21/2016   Myocardial infarct (HCC)    OSA (obstructive sleep apnea) 03/21/2016   uses oxygen  at night time   Pain in right ankle and joints of right foot 03/21/2016   Paronychia 03/21/2016   Phimosis    Pneumonia    Primary insomnia 03/21/2016   Prostate cancer (HCC) 2008   Pulmonary hypertension (HCC) 12/2018   Moderate, Noted on ECHO   Tobacco dependence 03/21/2016   Tricuspid regurgitation 12/2018   Mild, Noted on ECHO     Past Surgical History:  Procedure Laterality Date   ANKLE SURGERY Right 12/2013   CARDIAC CATHETERIZATION N/A 05/08/2016    Procedure: Left Heart Cath and Cors/Grafts Angiography;  Surgeon: Jeremiah Binning, MD;  Location: [facility] INVASIVE CV LAB;  Service: Cardiovascular;  Laterality: N/A;   CIRCUMCISION N/A 10/07/2019   Procedure: CIRCUMCISION ADULT;  Surgeon: Andrez Banker, MD;  Location: [facility] ORS;  Service: Urology;  Laterality: N/A;   CORONARY ARTERY BYPASS GRAFT  2006   x3   ELECTROPHYSIOLOGIC STUDY N/A 08/24/2016   Procedure: SVT Ablation;  Surgeon: Will Cortland Ding, MD;  Location: [facility] INVASIVE CV LAB;  Service: Cardiovascular;  Laterality: N/A;   PACEMAKER IMPLANT N/A 08/23/2023   Procedure: PACEMAKER IMPLANT;  Surgeon: Jeremiah Pump, MD;  Location: [facility] INVASIVE CV LAB;  Service: Cardiovascular;  Laterality: N/A;   PROSTATE SURGERY  2008   PTCA     UMBILICAL HERNIA REPAIR N/A 05/26/2022   Procedure: PRIMARY REPAIR OF STRANGULATED UMBILICAL HERNIA WITH PARTIAL OMENTECTOMY;  Surgeon: Lockie Rima, MD;  Location: [facility] OR;  Service: General;  Laterality: N/A;    Social History   Socioeconomic History   Marital status: Married    Spouse name: Not on file   Number of children: 0   Years of  education: Not on file   Highest education level: Not on file  Occupational History   Occupation: retired - direct [employer]  Tobacco Use   Smoking status: Former    Current packs/day: 0.00    Average packs/day: 0.8 packs/day for 60.0 years (45.0 ttl pk-yrs)    Types: Cigarettes    Start date: 12/15/1956    Quit date: 12/15/2016    Years since quitting: 7.0   Smokeless tobacco: Never  Vaping Use   Vaping status: Never Used  Substance and Sexual Activity   Alcohol use: Not Currently    Comment: very seldom   Drug use: No   Sexual activity: Not on file  Other Topics Concern   Not on file  Social History Narrative   Retired, married, no children      epworth sleepiness scale = 9 (04/13/16) (has OSA)   Social Drivers of Corporate investment banker Strain: Not on file  Food Insecurity: Low Risk  (11/28/2023)    Received from [facility]   Hunger Vital Sign    Worried About Running Out of Food in the Last Year: Never true    Ran Out of Food in the Last Year: Never true  Transportation Needs: No Transportation Needs (11/28/2023)   Received from [facility]    In the past 12 months, has lack of reliable transportation kept you from medical appointments, meetings, work or from getting things needed for daily living? : No  Physical Activity: Not on file  Stress: Not on file  Social Connections: Not on file  Intimate Partner Violence: Not At Risk (10/05/2023)   Humiliation, Afraid, Rape, and Kick questionnaire    Fear of Current or Ex-Partner: No    Emotionally Abused: No    Physically Abused: No    Sexually Abused: No    Family History  Problem Relation Age of Onset   Dementia Mother    Diabetes Sister      Current Facility-Administered Medications:    ondansetron  (ZOFRAN ) injection 4 mg, 4 mg, Intravenous, Once, Walisiewicz, Carmilla Granville E, PA-C  Current Outpatient Medications:    acetaminophen  (TYLENOL ) 650 MG CR tablet, Take 1,300 mg by mouth every 8 (eight) hours as needed for pain., Disp: , Rfl:    aspirin  EC 81 MG tablet, Take 1 tablet (81 mg total) by mouth daily. Swallow whole., Disp: 90 tablet, Rfl: 3   atorvastatin  (LIPITOR ) 80 MG tablet, Take 80 mg by mouth daily., Disp: , Rfl:    budesonide  (PULMICORT ) 0.5 MG/2ML nebulizer solution, Take 2 mLs (0.5 mg total) by nebulization 2 (two) times daily., Disp: 120 mL, Rfl: 11   cetirizine (ZYRTEC) 10 MG tablet, Take 10 mg by mouth daily., Disp: , Rfl:    colchicine  0.6 MG tablet, Take 0.6 mg by mouth daily as needed (Gout)., Disp: , Rfl:    dicyclomine  (BENTYL ) 10 MG capsule, Take 10 mg by mouth 3 (three) times daily before meals. 30 minutes before eating, Disp: , Rfl:    doxycycline (VIBRA-TABS) 100 MG tablet, Take 100 mg by mouth daily., Disp: , Rfl:    ezetimibe  (ZETIA ) 10 MG tablet, Take 1 tablet (10 mg total) by mouth  daily., Disp: 90 tablet, Rfl: 3   fluticasone  (FLONASE ) 50 MCG/ACT nasal spray, SHAKE LIQUID AND USE 1 SPRAY IN EACH NOSTRIL DAILY, Disp: 16 g, Rfl: 2   furosemide  (LASIX ) 40 MG tablet, Take 2 tablets (80 mg total) by mouth daily. (Patient taking differently: Take 40 mg  by mouth daily.), Disp: 30 tablet, Rfl: 0   glimepiride (AMARYL) 2 MG tablet, Take 2 mg by mouth every morning., Disp: , Rfl:    guaiFENesin-codeine  100-10 MG/5ML syrup, Take 5 mLs by mouth every 6 (six) hours as needed., Disp: , Rfl:    ketoconazole  (NIZORAL ) 2 % shampoo, Apply 1 Application topically daily., Disp: , Rfl:    levothyroxine  (SYNTHROID , LEVOTHROID) 88 MCG tablet, Take 88 mcg by mouth daily before breakfast. Take on an empty stomach, Disp: , Rfl:    Potassium Chloride  ER 20 MEQ TBCR, Take 1 tablet by mouth daily., Disp: , Rfl:    revefenacin  (YUPELRI ) 175 MCG/3ML nebulizer solution, Take 175 mcg by nebulization in the morning and at bedtime., Disp: , Rfl:    spironolactone  (ALDACTONE ) 25 MG tablet, Take 1 tablet (25 mg total) by mouth daily., Disp: 30 tablet, Rfl: 0  PHYSICAL EXAM: ECOG FS:2 - Symptomatic, <50% confined to bed    Vitals:   12/18/23 1256 12/18/23 1619  BP: (!) 119/55 (!) 115/58  Pulse: 68 64  Resp: 20 20  Temp: 97.6 F (36.4 C)   TempSrc: Temporal   SpO2: 100% 100%   Physical Exam Vitals and nursing note reviewed.  Constitutional:      Appearance: He is not ill-appearing or toxic-appearing.  HENT:     Head: Normocephalic.     Mouth/Throat:     Mouth: Mucous membranes are dry.  Eyes:     Conjunctiva/sclera: Conjunctivae normal.  Cardiovascular:     Rate and Rhythm: Normal rate and regular rhythm.     Pulses: Normal pulses.     Heart sounds: Normal heart sounds.  Pulmonary:     Effort: Pulmonary effort is normal.     Breath sounds: Normal breath sounds.     Comments: Oxygen  decreased to baseline of 2L from 6L. Abdominal:     General: There is no distension.  Musculoskeletal:      Cervical back: Normal range of motion.     Right lower leg: No edema.     Left lower leg: No edema.  Skin:    General: Skin is warm and dry.     Coloration: Skin is pale.  Neurological:     Mental Status: He is alert.     Comments: Unable to ambulate. Unsteady gait  Speech is clear and goal oriented, follows commands CN III-XII intact, no facial droop Normal strength in upper and lower extremities bilaterally including dorsiflexion and plantar flexion, strong and equal grip strength Sensation normal to light and sharp touch Moves extremities without ataxia, coordination intact Normal finger to nose and rapid alternating movements          LABORATORY DATA: I have reviewed the data as listed    Latest Ref Rng & Units 12/18/2023   12:35 PM 12/10/2023    8:19 AM 11/29/2023    9:42 AM  CBC  WBC 4.0 - 10.5 [name]/uL 4.8  4.1  6.2   Hemoglobin 13.0 - 17.0 g/dL 7.9  8.9  6.4   Hematocrit 39.0 - 52.0 % 26.5  30.1  21.7   Platelets 150 - 400 [name]/uL 239  264  244         Latest Ref Rng & Units 12/18/2023   12:35 PM 11/12/2023   10:27 AM 10/10/2023   10:14 AM  CMP  Glucose 70 - 99 mg/dL 161  096  045   BUN 8 - 23 mg/dL 20  19  28    Creatinine  0.61 - 1.24 mg/dL 3.08  6.57  8.46   Sodium 135 - 145 mmol/L 136  138  138   Potassium 3.5 - 5.1 mmol/L 3.7  4.3  4.5   Chloride 98 - 111 mmol/L 96  97  108   CO2 22 - 32 mmol/L 30  25  20    Calcium  8.9 - 10.3 mg/dL 9.8  9.0  8.8   Total Protein 6.5 - 8.1 g/dL 7.6     Total Bilirubin 0.0 - 1.2 mg/dL 0.5     Alkaline Phos 38 - 126 U/L 66     AST 15 - 41 U/L 15     ALT 0 - 44 U/L 12          RADIOGRAPHIC STUDIES (from last 24 hours if applicable) I have personally reviewed the radiological images as listed and agreed with the findings in the report. No results found.      Visit Diagnosis: 1. Symptomatic anemia   2. Dizziness   3. Nausea and vomiting, unspecified vomiting type      No orders of the defined types were placed in  this encounter.   All questions were answered. The patient knows to call the clinic with any problems, questions or concerns. No barriers to learning was detected.  A total of more than 40 minutes were spent on this encounter with face-to-face time and non-face-to-face time, including preparing to see the patient, ordering tests and/or medications, counseling the patient and coordination of care as outlined above.    Thank you for allowing me to participate in the care of this patient.    Laranda Burkemper E  Walisiewicz, PA-C Department of Hematology/Oncology [facility] at [facility] Phone: [phone]  [phone]    12/18/2023 5:33 PM

## 2023-12-19 ENCOUNTER — Inpatient Hospital Stay: Payer: Federal, State, Local not specified - PPO

## 2023-12-19 ENCOUNTER — Encounter (HOSPITAL_COMMUNITY): Payer: Self-pay

## 2023-12-19 ENCOUNTER — Encounter: Payer: Self-pay | Admitting: Physician Assistant

## 2023-12-19 ENCOUNTER — Telehealth: Payer: Self-pay | Admitting: Medical Oncology

## 2023-12-19 ENCOUNTER — Emergency Department (HOSPITAL_COMMUNITY): Payer: Federal, State, Local not specified - PPO

## 2023-12-19 LAB — URINALYSIS, ROUTINE W REFLEX MICROSCOPIC
Bacteria, UA: NONE SEEN
Bilirubin Urine: NEGATIVE
Glucose, UA: NEGATIVE mg/dL
Hgb urine dipstick: NEGATIVE
Ketones, ur: 5 mg/dL — AB
Leukocytes,Ua: NEGATIVE
Nitrite: NEGATIVE
Protein, ur: 30 mg/dL — AB
Specific Gravity, Urine: 1.043 — ABNORMAL HIGH (ref 1.005–1.030)
pH: 5 (ref 5.0–8.0)

## 2023-12-19 MED ORDER — ONDANSETRON 4 MG PO TBDP
4.0000 mg | ORAL_TABLET | Freq: Three times a day (TID) | ORAL | 0 refills | Status: AC | PRN
Start: 2023-12-19 — End: 2023-12-22

## 2023-12-19 MED ORDER — MECLIZINE HCL 25 MG PO TABS: 25.0000 mg | ORAL_TABLET | Freq: Three times a day (TID) | ORAL | 0 refills | Status: DC | PRN

## 2023-12-19 MED ORDER — IOHEXOL 350 MG/ML SOLN
75.0000 mL | Freq: Once | INTRAVENOUS | Status: AC | PRN
  Administered 2023-12-19: 75 mL via INTRAVENOUS

## 2023-12-19 MED ORDER — SODIUM CHLORIDE 0.9% IV SOLUTION: Freq: Once | INTRAVENOUS | Status: AC

## 2023-12-19 MED ORDER — MECLIZINE HCL 25 MG PO TABS
25.0000 mg | ORAL_TABLET | Freq: Once | ORAL | Status: AC
  Administered 2023-12-19: 25 mg via ORAL
  Filled 2023-12-19: qty 1

## 2023-12-19 MED ORDER — PROCHLORPERAZINE EDISYLATE 10 MG/2ML IJ SOLN
5.0000 mg | Freq: Once | INTRAMUSCULAR | Status: AC
  Administered 2023-12-19: 5 mg via INTRAVENOUS
  Filled 2023-12-19: qty 2

## 2023-12-19 MED ORDER — IOHEXOL 300 MG/ML  SOLN: 75.0000 mL | Freq: Once | INTRAMUSCULAR | Status: DC | PRN

## 2023-12-19 NOTE — ED Notes (Signed)
Will attempt [place] PIV for CTA.

## 2023-12-19 NOTE — Telephone Encounter (Signed)
Discharged today from ED after receiving one unit of blood.   -Should he keep the iron infusion appt on Monday?

## 2023-12-19 NOTE — ED Provider Notes (Signed)
[place] EMERGENCY DEPARTMENT AT [facility] Provider Note  CSN: [id number] Arrival date & time: 12/18/23 1631  Chief Complaint(s) Emesis N/v and dizziness after  iron infusion yesterday. NS and 4mg  Zofran admin at Cancer center w/o improvement.  Unable to ambulate and ambulatory at baseline.  Hx chf Hemoglobin 7.9. blood transfusion scheduled for tomorrow 2lpm [place] baseline  HPI Jeremiah [name] is a 78 y.o. male with extensive past medical history listed below including chronic intestinal bleeding requiring frequent blood transfusions and iron infusions who presents to the emergency department for dizziness with nausea and nonbloody nonbilious emesis following an iron infusion performed 2 days ago.  Patient reports that the symptoms began on his way home.  Reports that it feels like his equilibrium is off and feels like he is on a boat.  Unable to ambulate well.  He denies any visual changes, numbness tingling or focal weakness.  No prior history of the same.  He does endorse headache which she attributes to be dehydrated.  No recent fevers or infections.  The history is provided by the patient.    Past Medical History Past Medical History:  Diagnosis Date   A-fib [facility])    Acute on chronic systolic (congestive) heart failure (HCC) 06/10/2017   Acute pulmonary edema (HCC)    Anemia    Aortic valve sclerosis 12/2018   Noted on ECHO   Arthritis    CAD (coronary artery disease)    a. 2006: CABG in 2006 with LIMA-LAD, SVG-OM1, and SVG-RPDA   Cardiomegaly 12/2018   Stable, noted on CXR   Carpal tunnel syndrome    Right   Chronic pain 03/21/2016   Colon cancer (HCC) 2006   COPD (chronic obstructive pulmonary disease) (HCC)    Diabetes mellitus without complication (HCC)    DVT (deep venous thrombosis) (HCC)    Right   Essential hypertension 03/21/2016   GERD (gastroesophageal reflux disease)    History of blood transfusion    History of Clostridioides difficile  infection    History of prostate cancer 03/21/2016   History of PSVT (paroxysmal supraventricular tachycardia)    HLD (hyperlipidemia)    HTN (hypertension)    Hx of CABG 2006   Hypercholesteremia 03/21/2016   Hypothyroidism    LAE (left atrial enlargement) 12/2018   Severe, Noted on ECHO   LVH (left ventricular hypertrophy) 12/2018   Mild, Noted on ECHO   Medication management 03/21/2016   Mitral annular calcification 12/2018   with mild MS, Noted on ECHO   Morbid obesity (HCC) 03/21/2016   Myocardial infarct (HCC)    OSA (obstructive sleep apnea) 03/21/2016   uses oxygen at night time   Pain in right ankle and joints of right foot 03/21/2016   Paronychia 03/21/2016   Phimosis    Pneumonia    Primary insomnia 03/21/2016   Prostate cancer (HCC) 2008   Pulmonary hypertension (HCC) 12/2018   Moderate, Noted on ECHO   Tobacco dependence 03/21/2016   Tricuspid regurgitation 12/2018   Mild, Noted on ECHO   Patient Active Problem List   Diagnosis Date Noted   Iron deficiency anemia, unspecified 10/07/2023   S/P placement of cardiac pacemaker 08/24/2023   Complete heart block (HCC) 08/22/2023   Aortic stenosis 08/22/2023   Heart block 08/22/2023   Cardiogenic shock (HCC) 08/22/2023   Peripheral arterial disease (HCC) 08/13/2023   Chronic diastolic (congestive) heart failure (HCC) 06/05/2023   Acute on chronic respiratory failure with hypoxia (HCC) 06/05/2023  Acute on chronic diastolic CHF (congestive heart failure) (HCC) 06/05/2023   Ischemic colitis (HCC) 02/18/2023   Pulmonary hypertension, unspecified (HCC) 02/18/2023   New onset atrial fibrillation (HCC) 02/18/2023   Mitral stenosis 02/18/2023   Iron deficiency anemia 10/07/2022   Former smoker 08/21/2022   Incarcerated umbilical hernia 05/26/2022   History of supraventricular tachycardia 05/26/2022   Type 2 diabetes mellitus with hyperglycemia, without long-term current use of insulin (HCC) 04/29/2020   Carotid  artery calcification 09/09/2018   Coronary artery disease involving coronary bypass graft of native heart without angina pectoris 07/22/2017   COPD GOLD II with reversibility 04/03/2017   Chronic respiratory failure with hypoxia and hypercapnia (HCC) 04/03/2017   Hyperkalemia 02/26/2017   Prolonged QT interval 02/26/2017   Skin abscess 02/13/2017   Exertional shortness of breath 02/12/2017   Enteritis due to Clostridium difficile 01/05/2017   Acute GI bleeding 01/03/2017   Symptomatic anemia 01/03/2017   Hypothyroidism, acquired 01/03/2017   Cough    Diarrhea    AVNRT (AV nodal re-entry tachycardia) (HCC) 08/24/2016   NSTEMI (non-ST elevated myocardial infarction) (HCC) 05/05/2016   SVT (supraventricular tachycardia) (HCC) 03/21/2016   S/P CABG x 3 03/21/2016   Morbid obesity due to excess calories (HCC) complicated by hbp/hyperlipidemia  03/21/2016   Essential hypertension 03/21/2016   Hyperlipidemia LDL goal <70 03/21/2016   Paronychia 03/21/2016   Tobacco dependence 03/21/2016   Pain in right ankle and joints of right foot 03/21/2016   Chronic pain 03/21/2016   OSA (obstructive sleep apnea) 03/21/2016   Medication management 03/21/2016   History of prostate cancer 03/21/2016   Primary insomnia 03/21/2016   Post-traumatic osteoarthritis of right ankle 11/07/2015   Closed displaced fracture of medial malleolus of right tibia 10/24/2015   Home Medication(s) Prior to Admission medications   Medication Sig Start Date End Date Taking? Authorizing Provider  acetaminophen (TYLENOL) 650 MG CR tablet Take 1,300 mg by mouth every 8 (eight) hours as needed for pain.   Yes [provider]  aspirin EC 81 MG tablet Take 1 tablet (81 mg total) by mouth daily. Swallow whole. 04/17/23  Yes Orbie Pyo, MD  atorvastatin (LIPITOR) 80 MG tablet Take 80 mg by mouth daily.   Yes [provider]  benzonatate (TESSALON) 200 MG capsule Take 200 mg by mouth 3 (three) times daily  as needed. 11/13/23  Yes [provider]  budesonide (PULMICORT) 0.5 MG/2ML nebulizer solution Take 2 mLs (0.5 mg total) by nebulization 2 (two) times daily. 05/13/23  Yes Martina Sinner, MD  cetirizine (ZYRTEC) 10 MG tablet Take 10 mg by mouth daily.   Yes [provider]  colchicine 0.6 MG tablet Take 0.6 mg by mouth daily as needed (Gout).   Yes [provider]  dexlansoprazole (DEXILANT) 60 MG capsule Take 60 mg by mouth daily as needed.   Yes [provider]  dicyclomine (BENTYL) 10 MG capsule Take 10 mg by mouth 3 (three) times daily before meals. 30 minutes before eating 02/19/23  Yes [provider]  ezetimibe (ZETIA) 10 MG tablet Take 1 tablet (10 mg total) by mouth daily. 12/03/23 03/02/24 Yes Hilty, Lisette Abu, MD  ferrous sulfate 325 (65 FE) MG EC tablet Take 325 mg by mouth daily with breakfast.   Yes [provider]  fluticasone (FLONASE) 50 MCG/ACT nasal spray SHAKE LIQUID AND USE 1 SPRAY IN EACH NOSTRIL DAILY Patient taking differently: Place 1 spray into both nostrils daily. 10/30/23  Yes Hilty, Lisette Abu,  MD  furosemide (LASIX) 40 MG tablet Take 2 tablets (80 mg total) by mouth daily. Patient taking differently: Take 80 mg by mouth 2 (two) times daily. 06/11/23  Yes Zannie Cove, MD  glimepiride (AMARYL) 2 MG tablet Take 2 mg by mouth every morning. 08/28/23  Yes [provider]  guaiFENesin-codeine 100-10 MG/5ML syrup Take 5 mLs by mouth every 6 (six) hours as needed. 11/10/23  Yes [provider]  ketoconazole (NIZORAL) 2 % shampoo Apply 1 Application topically daily. 07/11/23  Yes [provider]  levothyroxine (SYNTHROID, LEVOTHROID) 88 MCG tablet Take 88 mcg by mouth daily before breakfast. Take on an empty stomach   Yes [provider]  meclizine (ANTIVERT) 25 MG tablet Take 1 tablet (25 mg total) by mouth 3 (three) times daily as needed for dizziness. 12/19/23  Yes Mylin Hirano, Amadeo Garnet, MD   metFORMIN (GLUCOPHAGE-XR) 500 MG 24 hr tablet Take 1,000 mg by mouth daily. 11/28/23  Yes [provider]  omeprazole (PRILOSEC) 40 MG capsule Take 40 mg by mouth daily.   Yes [provider]  ondansetron (ZOFRAN-ODT) 4 MG disintegrating tablet Take 1 tablet (4 mg total) by mouth every 8 (eight) hours as needed for up to 3 days for nausea or vomiting. 12/19/23 12/22/23 Yes Chavis Tessler, Amadeo Garnet, MD  Potassium Chloride ER 20 MEQ TBCR Take 1 tablet by mouth daily. 07/11/23  Yes [provider]  revefenacin (YUPELRI) 175 MCG/3ML nebulizer solution Take 175 mcg by nebulization in the morning and at bedtime.   Yes [provider]  spironolactone (ALDACTONE) 25 MG tablet Take 1 tablet (25 mg total) by mouth daily. 06/12/23  Yes Zannie Cove, MD                                                                                                                                    Allergies Shellfish allergy and Fluzone [influenza virus vaccine]  Review of Systems Review of Systems As noted in HPI  Physical Exam Vital Signs  I have reviewed the triage vital signs BP (!) 103/55   Pulse 60   Temp 97.7 F (36.5 C) (Oral)   Resp 18   Wt 92 kg   SpO2 100%   BMI 30.84 kg/m   Physical Exam Vitals reviewed.  Constitutional:      General: He is not in acute distress.    Appearance: He is well-developed. He is not diaphoretic.  HENT:     Head: Normocephalic and atraumatic.     Right Ear: External ear normal.     Left Ear: External ear normal.     Nose: Nose normal.     Mouth/Throat:     Mouth: Mucous membranes are moist.  Eyes:     General: No scleral icterus.    Conjunctiva/sclera: Conjunctivae normal.  Neck:     Trachea: Phonation normal.  Cardiovascular:     Rate and Rhythm: Normal rate and  regular rhythm.  Pulmonary:     Effort: Pulmonary effort is normal. No respiratory distress.     Breath sounds: No stridor.  Chest:    Abdominal:     General:  There is no distension.  Musculoskeletal:        General: Normal range of motion.     Cervical back: Normal range of motion.  Neurological:     Mental Status: He is alert and oriented to person, place, and time.     Comments: Mental Status:  Alert and oriented to person, place, and time.  Attention and concentration normal.  Speech clear.  Recent memory is intact  Cranial Nerves:  II Visual Fields: Intact to confrontation. Visual fields intact. III, IV, VI: Pupils equal and reactive to light and near. Full eye movement without nystagmus  V Facial Sensation: Normal. No weakness of masticatory muscles  VII: No facial weakness or asymmetry  VIII Auditory Acuity: Grossly normal  IX/X: The uvula is midline; the palate elevates symmetrically  XI: Normal sternocleidomastoid and trapezius strength  XII: The tongue is midline. No atrophy or fasciculations.   Motor System: Muscle Strength: 5/5 and symmetric in the upper and lower extremities. No pronation or drift.  Muscle Tone: Tone and muscle bulk are normal in the upper and lower extremities.   Coordination: Intact finger-to-nose, heel-to-shin. No tremor.  Sensation: Intact to light touch  Gait: deferred   Psychiatric:        Behavior: Behavior normal.     ED Results and Treatments Labs (all labs ordered are listed, but only abnormal results are displayed) Labs Reviewed  COMPREHENSIVE METABOLIC PANEL - Abnormal; Notable for the following components:      Result Value   Chloride 96 (*)    Glucose, Bld 159 (*)    Total Protein 8.3 (*)    Total Bilirubin 1.5 (*)    All other components within normal limits  CBC - Abnormal; Notable for the following components:   RBC 3.39 (*)    Hemoglobin 8.0 (*)    HCT 28.3 (*)    MCH 23.6 (*)    MCHC 28.3 (*)    RDW 18.0 (*)    All other components within normal limits  LIPASE, BLOOD  URINALYSIS, ROUTINE W REFLEX MICROSCOPIC  PREPARE RBC (CROSSMATCH)                                                                                                                          EKG  EKG Interpretation Date/Time:    Ventricular Rate:    PR Interval:    QRS Duration:    QT Interval:    QTC Calculation:   R Axis:      Text Interpretation:         Radiology CT ANGIO HEAD NECK W WO CM Result Date: 12/19/2023 CLINICAL DATA:  Nausea, vomiting and dizziness EXAM: CT ANGIOGRAPHY HEAD AND NECK WITH AND WITHOUT CONTRAST TECHNIQUE: Multidetector CT imaging of the head  and neck was performed using the standard protocol during bolus administration of intravenous contrast. Multiplanar CT image reconstructions and MIPs were obtained to evaluate the vascular anatomy. Carotid stenosis measurements (when applicable) are obtained utilizing NASCET criteria, using the distal internal carotid diameter as the denominator. RADIATION DOSE REDUCTION: This exam was performed according to the departmental dose-optimization program which includes automated exposure control, adjustment of the mA and/or kV according to patient size and/or use of iterative reconstruction technique. CONTRAST:  75mL OMNIPAQUE IOHEXOL 350 MG/ML SOLN COMPARISON:  None Available. FINDINGS: CT HEAD FINDINGS Brain: There is no mass, hemorrhage or extra-axial collection. The size and configuration of the ventricles and extra-axial CSF spaces are normal. There is hypoattenuation of the white matter, most commonly indicating chronic small vessel disease. Vascular: Atherosclerotic calcification of the vertebral and internal carotid arteries at the skull base. No abnormal hyperdensity of the major intracranial arteries or dural venous sinuses. Skull: The visualized skull base, calvarium and extracranial soft tissues are normal. Sinuses/Orbits: No fluid levels or advanced mucosal thickening of the visualized paranasal sinuses. No mastoid or middle ear effusion. Normal orbits. CTA NECK FINDINGS Skeleton: No acute abnormality or high grade bony spinal  canal stenosis. Other neck: Normal pharynx, larynx and major salivary glands. No cervical lymphadenopathy. Unremarkable thyroid gland. Upper chest: No pneumothorax or pleural effusion. No nodules or masses. Aortic arch: There is calcific atherosclerosis of the aortic arch. Conventional 3 vessel aortic branching pattern. RIGHT carotid system: Extensive atherosclerotic calcification throughout the common carotid artery extending into the proximal ICA causing 50% stenosis. LEFT carotid system: Multifocal calcific atherosclerosis of the common carotid artery with mixed density disease at the proximal ICA resulting in less than 50% stenosis. Vertebral arteries: Right dominant configuration. There is multifocal atherosclerotic calcification of both vertebral arteries. There is mild stenosis of the proximal left P1 segment. Otherwise but no hemodynamically significant stenosis to the level of the skull base. CTA HEAD FINDINGS POSTERIOR CIRCULATION: Atherosclerotic calcification of both V4 segments resulting in moderate stenosis bilaterally. No proximal occlusion of the anterior or inferior cerebellar arteries. Mild atherosclerosis of the basilar artery without significant stenosis. Superior cerebellar arteries are normal. Posterior cerebral arteries are normal. ANTERIOR CIRCULATION: Atherosclerotic calcification of both internal carotid arteries at the skull base resulting in severe stenosis of the left distal cavernous segment and moderate right cavernous segment stenosis. Anterior cerebral arteries are normal. Middle cerebral arteries are normal. Venous sinuses: As permitted by contrast timing, patent. Anatomic variants: None Review of the MIP images confirms the above findings. IMPRESSION: 1. No emergent large vessel occlusion. Widespread cerebrovascular atherosclerosis. 2. Severe stenosis of the left ICA distal cavernous segment and moderate right ICA cavernous segment stenosis. 3. Moderate stenosis of both vertebral  artery V4 segments. 4. A 50% stenosis of the proximal right ICA. Aortic Atherosclerosis (ICD10-I70.0). Electronically Signed   By: Deatra Robinson M.D.   On: 12/19/2023 03:36    Medications Ordered in ED Medications  0.9 %  sodium chloride infusion (Manually program via Guardrails IV Fluids) (has no administration in time range)  meclizine (ANTIVERT) tablet 25 mg (25 mg Oral Given 12/19/23 0034)  prochlorperazine (COMPAZINE) injection 5 mg (5 mg Intravenous Given 12/19/23 0027)  iohexol (OMNIPAQUE) 350 MG/ML injection 75 mL (75 mLs Intravenous Contrast Given 12/19/23 0227)   Procedures Procedures  (including critical care time) Medical Decision Making / ED Course   Medical Decision Making Amount and/or Complexity of Data Reviewed Labs: ordered. Decision-making details documented in ED Course. Radiology: ordered and independent  interpretation performed. Decision-making details documented in ED Course.  Risk Prescription drug management. Decision regarding hospitalization.    Patient presents for vertiginous symptoms. No focal deficits on exam.  Hints exam is reassuring for peripheral etiology but since patient has significant atherosclerosis, will obtain CTA of the head neck to rule out occlusion or dissection.  CT head did reveal significant atherosclerosis with severe and moderate ICA stenosis.  Patient made aware.  Given his chronic GI bleeds requiring frequent blood transfusions and iron infusions, he is not candidate for increased dose of aspirin or anticoagulation.  He was treated for peripheral vertigo with Compazine and Antivert which provided significant relief.  Patient was able to ambulate.  Tolerating p.o.  CBC without leukocytosis.  Hemoglobin of 8.0.  Patient was set to get blood transfusion and oncology Center blood ready.  Will transfuse.  CMP at baseline.  Patient is cleared for discharge following blood transfusion.    Final Clinical Impression(s) / ED  Diagnoses Final diagnoses:  Vertigo  Atherosclerosis of arteries   The patient appears reasonably screened and/or stabilized for discharge and I doubt any other medical condition or other [facility] requiring further screening, evaluation, or treatment in the ED at this time. I have discussed the findings, Dx and Tx plan with the patient/family who expressed understanding and agree(s) with the plan. Discharge instructions discussed at length. The patient/family was given strict return precautions who verbalized understanding of the instructions. No further questions at time of discharge.  Disposition: Discharge  Condition: Good  ED Discharge Orders          Ordered    ondansetron (ZOFRAN-ODT) 4 MG disintegrating tablet  Every 8 hours PRN        12/19/23 0520    meclizine (ANTIVERT) 25 MG tablet  3 times daily PRN        12/19/23 0520             Follow Up: Raynelle Fanning, MD [address] [place] [address] [phone]  Call  to schedule an appointment for close follow up  [name], MD [address] [place] [address] [phone]  Call  to schedule an appointment for close follow up     This chart was dictated using voice recognition software.  Despite best efforts to proofread,  errors can occur which can change the documentation meaning.    Nira Conn, MD 12/19/23 [phone]

## 2023-12-19 NOTE — ED Notes (Signed)
Pt does not have home o2 with them. Pt's wife will go home and retrieve it.

## 2023-12-19 NOTE — Discharge Instructions (Addendum)
You were seen in the emerged department for dizziness We gave you medications that would help Please pick up the Antivert and Zofran from your pharmacy We gave you a blood transfusion Follow-up with your primary care doctor Return to the emergency department for severe dizziness or any other concerns

## 2023-12-19 NOTE — ED Notes (Signed)
[place] PIV placed. Pt ready for CTA.

## 2023-12-20 ENCOUNTER — Telehealth: Payer: Self-pay | Admitting: Internal Medicine

## 2023-12-20 ENCOUNTER — Ambulatory Visit: Admission: RE | Admit: 2023-12-20 | Payer: Federal, State, Local not specified - PPO | Source: Ambulatory Visit

## 2023-12-20 ENCOUNTER — Inpatient Hospital Stay: Admission: RE | Admit: 2023-12-20 | Payer: Federal, State, Local not specified - PPO | Source: Ambulatory Visit

## 2023-12-20 LAB — BPAM RBC
Blood Product Expiration Date: 202502042359
ISSUE DATE / TIME: 202501160749
Unit Type and Rh: 5100

## 2023-12-20 LAB — TYPE AND SCREEN
ABO/RH(D): O POS
Antibody Screen: POSITIVE
Donor AG Type: NEGATIVE
Unit division: 0

## 2023-12-20 NOTE — Telephone Encounter (Signed)
Pt c/o medication issue:  1. Name of Medication:   ondansetron (ZOFRAN-ODT) 4 MG disintegrating tablet    2. How are you currently taking this medication (dosage and times per day)? Pt has taken 2 tabs so far then read SE  3. Are you having a reaction (difficulty breathing--STAT)? No  4. What is your medication issue? Wants to know if med is safe to take along with other heart medications.

## 2023-12-21 ENCOUNTER — Encounter: Payer: Self-pay | Admitting: Internal Medicine

## 2023-12-23 ENCOUNTER — Ambulatory Visit: Payer: BC Managed Care – PPO

## 2023-12-24 ENCOUNTER — Inpatient Hospital Stay: Payer: Federal, State, Local not specified - PPO

## 2023-12-24 DIAGNOSIS — D500 Iron deficiency anemia secondary to blood loss (chronic): Secondary | ICD-10-CM | POA: Diagnosis not present

## 2023-12-24 DIAGNOSIS — D649 Anemia, unspecified: Secondary | ICD-10-CM

## 2023-12-24 LAB — IRON AND IRON BINDING CAPACITY (CC-WL,HP ONLY)
Iron: 63 ug/dL (ref 45–182)
Saturation Ratios: 13 % — ABNORMAL LOW (ref 17.9–39.5)
TIBC: 487 ug/dL — ABNORMAL HIGH (ref 250–450)
UIBC: 424 ug/dL — ABNORMAL HIGH (ref 117–376)

## 2023-12-24 LAB — CBC WITH DIFFERENTIAL (CANCER CENTER ONLY)
Abs Immature Granulocytes: 0.06 10*3/uL (ref 0.00–0.07)
Basophils Absolute: 0 10*3/uL (ref 0.0–0.1)
Basophils Relative: 1 %
Eosinophils Absolute: 0.2 10*3/uL (ref 0.0–0.5)
Eosinophils Relative: 4 %
HCT: 35.2 % — ABNORMAL LOW (ref 39.0–52.0)
Hemoglobin: 10.4 g/dL — ABNORMAL LOW (ref 13.0–17.0)
Immature Granulocytes: 1 %
Lymphocytes Relative: 20 %
Lymphs Abs: 1.1 10*3/uL (ref 0.7–4.0)
MCH: 25.6 pg — ABNORMAL LOW (ref 26.0–34.0)
MCHC: 29.5 g/dL — ABNORMAL LOW (ref 30.0–36.0)
MCV: 86.7 fL (ref 80.0–100.0)
Monocytes Absolute: 0.5 10*3/uL (ref 0.1–1.0)
Monocytes Relative: 10 %
Neutro Abs: 3.5 10*3/uL (ref 1.7–7.7)
Neutrophils Relative %: 64 %
Platelet Count: 202 10*3/uL (ref 150–400)
RBC: 4.06 MIL/uL — ABNORMAL LOW (ref 4.22–5.81)
RDW: 21.2 % — ABNORMAL HIGH (ref 11.5–15.5)
WBC Count: 5.4 10*3/uL (ref 4.0–10.5)
nRBC: 0 % (ref 0.0–0.2)

## 2023-12-24 LAB — FERRITIN: Ferritin: 109 ng/mL (ref 24–336)

## 2023-12-24 LAB — SAMPLE TO BLOOD BANK

## 2024-01-07 ENCOUNTER — Ambulatory Visit: Payer: Federal, State, Local not specified - PPO | Attending: Cardiovascular Disease | Admitting: Cardiovascular Disease

## 2024-01-07 ENCOUNTER — Encounter: Payer: Self-pay | Admitting: Cardiovascular Disease

## 2024-01-07 VITALS — BP 112/68 | HR 90 | Ht 68.0 in | Wt 211.0 lb

## 2024-01-07 DIAGNOSIS — I739 Peripheral vascular disease, unspecified: Secondary | ICD-10-CM

## 2024-01-07 NOTE — Patient Instructions (Signed)
 Medication Instructions:  Your physician recommends that you continue on your current medications as directed. Please refer to the Current Medication list given to you today.  *If you need a refill on your cardiac medications before your next appointment, please call your pharmacy*   Follow-Up: At [facility], you and your health needs are our priority.  As part of our continuing mission to provide you with exceptional heart care, we have created designated Provider Care Teams.  These Care Teams include your primary Cardiologist (physician) and Advanced Practice Providers (APPs -  Physician Assistants and Nurse Practitioners) who all work together to provide you with the care you need, when you need it.  We recommend signing up for the patient portal called "MyChart".  Sign up information is provided on this After Visit Summary.  MyChart is used to connect with patients for Virtual Visits (Telemedicine).  Patients are able to view lab/test results, encounter notes, upcoming appointments, etc.  Non-urgent messages can be sent to your provider as well.   To learn more about what you can do with MyChart, go to [email or web address].    Your next appointment:   6 month(s)  Provider:   Nanetta Batty, MD   Other Instructions

## 2024-01-07 NOTE — Progress Notes (Signed)
 Jeremiah [name] returns today for follow-up.  I last saw him in the office 09/27/2023.  He is accompanied by his wife Jeremiah [name].  He was originally referred to me by Dr. Mona for claudication.  He does have bilateral SFA disease but unfortunately has had recurrent GI bleeding precluding him from being on a DOAC for his A-fib and on antiplatelet drugs in the event that he has endovascular therapy.  We have agreed to delay consideration of this for 6 months to see whether he has been bleeding free.  Dorn DOROTHA Lesches, M.D., FACP, [facility], [facility] [address] [place], [place]  [address]  [phone] 01/07/2024 11:35 AM

## 2024-01-08 ENCOUNTER — Inpatient Hospital Stay: Payer: Federal, State, Local not specified - PPO

## 2024-01-08 ENCOUNTER — Inpatient Hospital Stay: Payer: Federal, State, Local not specified - PPO | Attending: Physician Assistant | Admitting: Internal Medicine

## 2024-01-08 VITALS — BP 108/68 | HR 99 | Temp 97.9°F | Resp 18 | Ht 68.0 in | Wt 210.1 lb

## 2024-01-08 DIAGNOSIS — R0602 Shortness of breath: Secondary | ICD-10-CM | POA: Insufficient documentation

## 2024-01-08 DIAGNOSIS — R42 Dizziness and giddiness: Secondary | ICD-10-CM | POA: Insufficient documentation

## 2024-01-08 DIAGNOSIS — K922 Gastrointestinal hemorrhage, unspecified: Secondary | ICD-10-CM | POA: Diagnosis not present

## 2024-01-08 DIAGNOSIS — Z8546 Personal history of malignant neoplasm of prostate: Secondary | ICD-10-CM | POA: Diagnosis not present

## 2024-01-08 DIAGNOSIS — E1151 Type 2 diabetes mellitus with diabetic peripheral angiopathy without gangrene: Secondary | ICD-10-CM | POA: Insufficient documentation

## 2024-01-08 DIAGNOSIS — Z9981 Dependence on supplemental oxygen: Secondary | ICD-10-CM | POA: Insufficient documentation

## 2024-01-08 DIAGNOSIS — D500 Iron deficiency anemia secondary to blood loss (chronic): Secondary | ICD-10-CM | POA: Insufficient documentation

## 2024-01-08 DIAGNOSIS — D649 Anemia, unspecified: Secondary | ICD-10-CM

## 2024-01-08 LAB — CBC WITH DIFFERENTIAL (CANCER CENTER ONLY)
Abs Immature Granulocytes: 0.02 10*3/uL (ref 0.00–0.07)
Basophils Absolute: 0 10*3/uL (ref 0.0–0.1)
Basophils Relative: 1 %
Eosinophils Absolute: 0.2 10*3/uL (ref 0.0–0.5)
Eosinophils Relative: 4 %
HCT: 37.2 % — ABNORMAL LOW (ref 39.0–52.0)
Hemoglobin: 11.5 g/dL — ABNORMAL LOW (ref 13.0–17.0)
Immature Granulocytes: 0 %
Lymphocytes Relative: 27 %
Lymphs Abs: 1.2 10*3/uL (ref 0.7–4.0)
MCH: 26.6 pg (ref 26.0–34.0)
MCHC: 30.9 g/dL (ref 30.0–36.0)
MCV: 85.9 fL (ref 80.0–100.0)
Monocytes Absolute: 0.5 10*3/uL (ref 0.1–1.0)
Monocytes Relative: 11 %
Neutro Abs: 2.6 10*3/uL (ref 1.7–7.7)
Neutrophils Relative %: 57 %
Platelet Count: 206 10*3/uL (ref 150–400)
RBC: 4.33 MIL/uL (ref 4.22–5.81)
RDW: 21.9 % — ABNORMAL HIGH (ref 11.5–15.5)
WBC Count: 4.5 10*3/uL (ref 4.0–10.5)
nRBC: 0 % (ref 0.0–0.2)

## 2024-01-08 LAB — IRON AND IRON BINDING CAPACITY (CC-WL,HP ONLY)
Iron: 65 ug/dL (ref 45–182)
Saturation Ratios: 13 % — ABNORMAL LOW (ref 17.9–39.5)
TIBC: 505 ug/dL — ABNORMAL HIGH (ref 250–450)
UIBC: 440 ug/dL — ABNORMAL HIGH (ref 117–376)

## 2024-01-08 LAB — SAMPLE TO BLOOD BANK

## 2024-01-08 LAB — FERRITIN: Ferritin: 27 ng/mL (ref 24–336)

## 2024-01-08 NOTE — Progress Notes (Signed)
 [facility] [phone]   [phone]  OFFICE PROGRESS NOTE  Koirala, Dibas, MD [address] [place] [address]  DIAGNOSIS: Iron  deficiency anemia secondary to chronic gastrointestinal hemorrhage    PRIOR THERAPY:  1) Iron  infusion with Ferrlecit 250 Mg IV for 4 doses. Last dose was given on 06/09/2023  2) IV iron  infusion with feraheme, last dose on 10/21/23   CURRENT THERAPY: Oral ferrous sulfate  with orange juice as well as oral vitamin B12 1000 mcg p.o. daily and blood transfusions PRN to keep Hbg >8.   INTERVAL HISTORY: Jeremiah [name] 78 y.o. male returns to the clinic today for visit accompanied by his wife.Discussed the use of AI scribe software for clinical note transcription with the patient, who gave verbal consent to proceed.  History of Present Illness   Jeremiah [name] is a 78 year old male with iron  deficiency anemia due to gastrointestinal blood loss who presents for follow-up. He is accompanied by his wife, Jeremiah [name].  He has iron  deficiency anemia secondary to gastrointestinal blood loss, experiencing frequent hemorrhages from his gastrointestinal tract. He has been receiving blood transfusions and iron  infusions and is currently taking iron  tablets and vitamin B12 supplements. His recent hemoglobin level is 11.5, which is an improvement.  He experiences shortness of breath and is on home oxygen  therapy. This symptom is likely related to his anemia and vascular issues.  He recently consulted with a vascular surgeon regarding a blockage in his leg, which will be addressed after the bleeding issue is resolved.  He reports experiencing vertigo, which began after a previous hospital visit. He is currently taking meclizine , which has helped, but he still struggles with balance issues.  He had a reaction to a Feraheme iron  infusion, which included symptoms of dizziness and nausea while driving home, requiring him to stop  and use additional oxygen .       MEDICAL HISTORY: Past Medical History:  Diagnosis Date   A-fib (HCC)    Acute on chronic systolic (congestive) heart failure (HCC) 06/10/2017   Acute pulmonary edema (HCC)    Anemia    Aortic valve sclerosis 12/2018   Noted on ECHO   Arthritis    CAD (coronary artery disease)    a. 2006: CABG in 2006 with LIMA-LAD, SVG-OM1, and SVG-RPDA   Cardiomegaly 12/2018   Stable, noted on CXR   Carpal tunnel syndrome    Right   Chronic pain 03/21/2016   Colon cancer (HCC) 2006   COPD (chronic obstructive pulmonary disease) (HCC)    Diabetes mellitus without complication (HCC)    DVT (deep venous thrombosis) (HCC)    Right   Essential hypertension 03/21/2016   GERD (gastroesophageal reflux disease)    History of blood transfusion    History of Clostridioides difficile infection    History of prostate cancer 03/21/2016   History of PSVT (paroxysmal supraventricular tachycardia)    HLD (hyperlipidemia)    HTN (hypertension)    Hx of CABG 2006   Hypercholesteremia 03/21/2016   Hypothyroidism    LAE (left atrial enlargement) 12/2018   Severe, Noted on ECHO   LVH (left ventricular hypertrophy) 12/2018   Mild, Noted on ECHO   Medication management 03/21/2016   Mitral annular calcification 12/2018   with mild MS, Noted on ECHO   Morbid obesity (HCC) 03/21/2016   Myocardial infarct (HCC)    OSA (obstructive sleep apnea) 03/21/2016   uses oxygen  at night time  Pain in right ankle and joints of right foot 03/21/2016   Paronychia 03/21/2016   Phimosis    Pneumonia    Primary insomnia 03/21/2016   Prostate cancer (HCC) 2008   Pulmonary hypertension (HCC) 12/2018   Moderate, Noted on ECHO   Tobacco dependence 03/21/2016   Tricuspid regurgitation 12/2018   Mild, Noted on ECHO    ALLERGIES:  is allergic to shellfish allergy and fluzone [influenza virus vaccine].  MEDICATIONS:  Current Outpatient Medications  Medication Sig Dispense Refill    acetaminophen  (TYLENOL ) 650 MG CR tablet Take 1,300 mg by mouth every 8 (eight) hours as needed for pain.     aspirin  EC 81 MG tablet Take 1 tablet (81 mg total) by mouth daily. Swallow whole. 90 tablet 3   atorvastatin  (LIPITOR ) 80 MG tablet Take 80 mg by mouth daily.     benzonatate  (TESSALON ) 200 MG capsule Take 200 mg by mouth 3 (three) times daily as needed.     budesonide  (PULMICORT ) 0.5 MG/2ML nebulizer solution Take 2 mLs (0.5 mg total) by nebulization 2 (two) times daily. 120 mL 11   cetirizine  (ZYRTEC ) 10 MG tablet Take 10 mg by mouth daily.     colchicine  0.6 MG tablet Take 0.6 mg by mouth daily as needed (Gout).     dexlansoprazole (DEXILANT) 60 MG capsule Take 60 mg by mouth daily as needed.     dicyclomine  (BENTYL ) 10 MG capsule Take 10 mg by mouth 3 (three) times daily before meals. 30 minutes before eating     ezetimibe  (ZETIA ) 10 MG tablet Take 1 tablet (10 mg total) by mouth daily. 90 tablet 3   ferrous sulfate  325 (65 FE) MG EC tablet Take 325 mg by mouth daily with breakfast.     fluticasone  (FLONASE ) 50 MCG/ACT nasal spray SHAKE LIQUID AND USE 1 SPRAY IN EACH NOSTRIL DAILY (Patient taking differently: Place 1 spray into both nostrils daily.) 16 g 2   furosemide  (LASIX ) 40 MG tablet Take 2 tablets (80 mg total) by mouth daily. (Patient taking differently: Take 80 mg by mouth 2 (two) times daily.) 30 tablet 0   glimepiride (AMARYL) 2 MG tablet Take 2 mg by mouth every morning.     guaiFENesin-codeine  100-10 MG/5ML syrup Take 5 mLs by mouth every 6 (six) hours as needed.     ketoconazole  (NIZORAL ) 2 % shampoo Apply 1 Application topically daily.     levothyroxine  (SYNTHROID , LEVOTHROID) 88 MCG tablet Take 88 mcg by mouth daily before breakfast. Take on an empty stomach     meclizine  (ANTIVERT ) 25 MG tablet Take 1 tablet (25 mg total) by mouth 3 (three) times daily as needed for dizziness. 30 tablet 0   metFORMIN (GLUCOPHAGE-XR) 500 MG 24 hr tablet Take 1,000 mg by mouth daily.      omeprazole (PRILOSEC) 40 MG capsule Take 40 mg by mouth daily.     Potassium Chloride  ER 20 MEQ TBCR Take 1 tablet by mouth daily.     revefenacin  (YUPELRI ) 175 MCG/3ML nebulizer solution Take 175 mcg by nebulization in the morning and at bedtime.     spironolactone  (ALDACTONE ) 25 MG tablet Take 1 tablet (25 mg total) by mouth daily. 30 tablet 0   No current facility-administered medications for this visit.    SURGICAL HISTORY:  Past Surgical History:  Procedure Laterality Date   ANKLE SURGERY Right 12/2013   CARDIAC CATHETERIZATION N/A 05/08/2016   Procedure: Left Heart Cath and Cors/Grafts Angiography;  Surgeon: Victory LELON Sharps, MD;  Location: [facility];  Service: Cardiovascular;  Laterality: N/A;   CIRCUMCISION N/A 10/07/2019   Procedure: CIRCUMCISION ADULT;  Surgeon: Cam Morene ORN, MD;  Location: [facility];  Service: Urology;  Laterality: N/A;   CORONARY ARTERY BYPASS GRAFT  2006   x3   ELECTROPHYSIOLOGIC STUDY N/A 08/24/2016   Procedure: SVT Ablation;  Surgeon: Will Gladis Norton, MD;  Location: [facility];  Service: Cardiovascular;  Laterality: N/A;   PACEMAKER IMPLANT N/A 08/23/2023   Procedure: PACEMAKER IMPLANT;  Surgeon: Norton Soyla Gladis, MD;  Location: [facility];  Service: Cardiovascular;  Laterality: N/A;   PROSTATE SURGERY  2008   PTCA     UMBILICAL HERNIA REPAIR N/A 05/26/2022   Procedure: PRIMARY REPAIR OF STRANGULATED UMBILICAL HERNIA WITH PARTIAL OMENTECTOMY;  Surgeon: Aron Shoulders, MD;  Location: [facility] OR;  Service: General;  Laterality: N/A;    REVIEW OF SYSTEMS:  Constitutional: positive for fatigue Eyes: negative Ears, nose, mouth, throat, and face: negative Respiratory: positive for dyspnea on exertion Cardiovascular: negative Gastrointestinal: negative Genitourinary:negative Integument/breast: negative Hematologic/lymphatic: negative Musculoskeletal:negative Neurological: negative Behavioral/Psych: negative Endocrine:  negative Allergic/Immunologic: negative   PHYSICAL EXAMINATION: General appearance: alert, cooperative, fatigued, and no distress Head: Normocephalic, without obvious abnormality, atraumatic Neck: no adenopathy, no JVD, supple, symmetrical, trachea midline, and thyroid  not enlarged, symmetric, no tenderness/mass/nodules Lymph nodes: Cervical, supraclavicular, and axillary nodes normal. Resp: clear to auscultation bilaterally Back: symmetric, no curvature. ROM normal. No CVA tenderness. Cardio: regular rate and rhythm, S1, S2 normal, no murmur, click, rub or gallop GI: soft, non-tender; bowel sounds normal; no masses,  no organomegaly Extremities: extremities normal, atraumatic, no cyanosis or edema Neurologic: Alert and oriented X 3, normal strength and tone. Normal symmetric reflexes. Normal coordination and gait  ECOG PERFORMANCE STATUS: 1 - Symptomatic but completely ambulatory  Blood pressure 108/68, pulse 99, temperature 97.9 F (36.6 C), temperature source Temporal, resp. rate 18, height 5' 8 (1.727 m), weight 210 lb 1.6 oz (95.3 kg), SpO2 99%.  LABORATORY DATA: Lab Results  Component Value Date   WBC 4.5 01/08/2024   HGB 11.5 (L) 01/08/2024   HCT 37.2 (L) 01/08/2024   MCV 85.9 01/08/2024   PLT 206 01/08/2024      Chemistry      Component Value Date/Time   NA 135 12/18/2023 1732   NA 138 11/12/2023 1027   K 4.6 12/18/2023 1732   CL 96 (L) 12/18/2023 1732   CO2 27 12/18/2023 1732   BUN 23 12/18/2023 1732   BUN 19 11/12/2023 1027   CREATININE 0.94 12/18/2023 1732   CREATININE 0.98 12/18/2023 1235      Component Value Date/Time   CALCIUM  9.3 12/18/2023 1732   ALKPHOS 63 12/18/2023 1732   AST 40 12/18/2023 1732   AST 15 12/18/2023 1235   ALT 17 12/18/2023 1732   ALT 12 12/18/2023 1235   BILITOT 1.5 (H) 12/18/2023 1732   BILITOT 0.5 12/18/2023 1235       RADIOGRAPHIC STUDIES: CT ANGIO HEAD NECK W WO CM Result Date: 12/19/2023 CLINICAL DATA:  Nausea, vomiting  and dizziness EXAM: CT ANGIOGRAPHY HEAD AND NECK WITH AND WITHOUT CONTRAST TECHNIQUE: Multidetector CT imaging of the head and neck was performed using the standard protocol during bolus administration of intravenous contrast. Multiplanar CT image reconstructions and MIPs were obtained to evaluate the vascular anatomy. Carotid stenosis measurements (when applicable) are obtained utilizing NASCET criteria, using the distal internal carotid diameter as the denominator. RADIATION DOSE REDUCTION: This exam was  performed according to the departmental dose-optimization program which includes automated exposure control, adjustment of the mA and/or kV according to patient size and/or use of iterative reconstruction technique. CONTRAST:  75mL OMNIPAQUE  IOHEXOL  350 MG/ML SOLN COMPARISON:  None Available. FINDINGS: CT HEAD FINDINGS Brain: There is no mass, hemorrhage or extra-axial collection. The size and configuration of the ventricles and extra-axial CSF spaces are normal. There is hypoattenuation of the white matter, most commonly indicating chronic small vessel disease. Vascular: Atherosclerotic calcification of the vertebral and internal carotid arteries at the skull base. No abnormal hyperdensity of the major intracranial arteries or dural venous sinuses. Skull: The visualized skull base, calvarium and extracranial soft tissues are normal. Sinuses/Orbits: No fluid levels or advanced mucosal thickening of the visualized paranasal sinuses. No mastoid or middle ear effusion. Normal orbits. CTA NECK FINDINGS Skeleton: No acute abnormality or high grade bony spinal canal stenosis. Other neck: Normal pharynx, larynx and major salivary glands. No cervical lymphadenopathy. Unremarkable thyroid  gland. Upper chest: No pneumothorax or pleural effusion. No nodules or masses. Aortic arch: There is calcific atherosclerosis of the aortic arch. Conventional 3 vessel aortic branching pattern. RIGHT carotid system: Extensive  atherosclerotic calcification throughout the common carotid artery extending into the proximal ICA causing 50% stenosis. LEFT carotid system: Multifocal calcific atherosclerosis of the common carotid artery with mixed density disease at the proximal ICA resulting in less than 50% stenosis. Vertebral arteries: Right dominant configuration. There is multifocal atherosclerotic calcification of both vertebral arteries. There is mild stenosis of the proximal left P1 segment. Otherwise but no hemodynamically significant stenosis to the level of the skull base. CTA HEAD FINDINGS POSTERIOR CIRCULATION: Atherosclerotic calcification of both V4 segments resulting in moderate stenosis bilaterally. No proximal occlusion of the anterior or inferior cerebellar arteries. Mild atherosclerosis of the basilar artery without significant stenosis. Superior cerebellar arteries are normal. Posterior cerebral arteries are normal. ANTERIOR CIRCULATION: Atherosclerotic calcification of both internal carotid arteries at the skull base resulting in severe stenosis of the left distal cavernous segment and moderate right cavernous segment stenosis. Anterior cerebral arteries are normal. Middle cerebral arteries are normal. Venous sinuses: As permitted by contrast timing, patent. Anatomic variants: None Review of the MIP images confirms the above findings. IMPRESSION: 1. No emergent large vessel occlusion. Widespread cerebrovascular atherosclerosis. 2. Severe stenosis of the left ICA distal cavernous segment and moderate right ICA cavernous segment stenosis. 3. Moderate stenosis of both vertebral artery V4 segments. 4. A 50% stenosis of the proximal right ICA. Aortic Atherosclerosis (ICD10-I70.0). Electronically Signed   By: Franky Stanford M.D.   On: 12/19/2023 03:36    ASSESSMENT AND PLAN: This is a very pleasant 78 years old white male with history of iron  deficiency anemia secondary to chronic gastrointestinal hemorrhage.  The patient has  been on treatment with iron  infusion as well as frequent PRBCs transfusion recently.    Gastrointestinal Hemorrhage Chronic gastrointestinal bleeding leading to iron  deficiency anemia. Scheduled for a procedure at [facility] on February 18th to identify and ablate the source of bleeding. Successful ablation will prevent further blood loss and eliminate the need for ongoing transfusions and iron  infusions. - Proceed with the scheduled procedure at [facility] on February 18th by Dr. Gaile.  Iron  Deficiency Anemia Chronic iron  deficiency anemia secondary to gastrointestinal blood loss. Hemoglobin today is 11.5, an improvement. He has been receiving blood transfusions, iron  infusions, and is currently on iron  tablets and vitamin B12 supplements. Experienced a reaction to the last Feraheme infusion, likely due to the iron  infusion  itself. If the bleeding source is fixed, he will not require further blood transfusions or iron  infusions. - Arrange blood work on February 14th to determine if a transfusion is needed before the procedure at [facility] on February 18th - Consider increasing premedications (e.g., more prednisone , more Panadol) if future iron  infusions are required  Peripheral Arterial Disease Blockage in his leg, scheduled for surgery after gastrointestinal bleeding is controlled. Will require blood thinners post-surgery. Vascular surgery will proceed once the bleeding is managed to avoid complications. - Proceed with vascular surgery for leg blockage after gastrointestinal bleeding is controlled - Initiate blood thinners post-surgery  Vertigo Ongoing vertigo and balance issues. Currently on meclizine , which has helped but not completely resolved the symptoms. Vertigo may also be contributing to balance issues experienced after the iron  infusion. - Continue meclizine  for vertigo management  Follow-up - Arrange follow-up appointment on February 14th with Jeremiah [name] to check blood levels and determine the need for  transfusion.   He was advised to call immediately if he has any concerning symptoms in the interval. The patient voices understanding of current disease status and treatment options and is in agreement with the current care plan.  All questions were answered. The patient knows to call the clinic with any problems, questions or concerns. We can certainly see the patient much sooner if necessary.  The total time spent in the appointment was 30 minutes.  Disclaimer: This note was dictated with voice recognition software. Similar sounding words can inadvertently be transcribed and may not be corrected upon review.

## 2024-01-10 ENCOUNTER — Encounter: Payer: Self-pay | Admitting: Internal Medicine

## 2024-01-10 LAB — LIPID PANEL
Chol/HDL Ratio: 4.1 {ratio} (ref 0.0–5.0)
Cholesterol, Total: 143 mg/dL (ref 100–199)
HDL: 35 mg/dL — ABNORMAL LOW (ref >39)
LDL Chol Calc (NIH): 75 mg/dL (ref 0–99)
Triglycerides: 193 mg/dL — ABNORMAL HIGH (ref 0–149)
VLDL Cholesterol Cal: 33 mg/dL (ref 5–40)

## 2024-01-13 ENCOUNTER — Other Ambulatory Visit (HOSPITAL_COMMUNITY): Payer: Federal, State, Local not specified - PPO

## 2024-01-14 NOTE — Progress Notes (Unsigned)
[facility] OFFICE PROGRESS NOTE  Koirala, Dibas, MD [address] [place] [address]  DIAGNOSIS: Iron deficiency anemia secondary to chronic gastrointestinal hemorrhage   PRIOR THERAPY: 1) Iron infusion with Ferrlecit 250 Mg IV for 4 doses. Last dose was given on 06/09/2023  2) IV iron infusion with feraheme, last dose on 12/17/23  CURRENT THERAPY: Oral ferrous sulfate with orange juice as well as oral vitamin B12 1000 mcg p.o. daily and blood transfusions PRN to keep Hbg >8.   INTERVAL HISTORY: Levy Cedano 78 y.o. male returns to the clinic today for a follow-up visit.  The patient was last seen in the clinic in by Dr. Arbutus Ped on 01/08/24.   The patient has a history of anemia secondary to GI blood loss. His most recent capsule endoscopy was performed at [facility] where he had some areas ablated.  They referred him to [facility]. He has an appointment on 01/21/2024 with Dr. Faylene Million for retrograde device assisted endoscopy.   He had an iron infusion with feraheme on 12/17/23 and subsequently experienced nausea and dizziness and went to the ER.   At his last appointment with me in December 2024, I strongly encouraged him to also take p.o. iron as well to keep his iron up for maintenance so hopefully he will not require as many blood and iron infusions. Moving forward, if he requires IV iron, then we may need to consider additional premedications as well as trying a different IV iron. He did stop taking his iron supplement for a few days in preparation for his upcoming procedure. He will resume taking his iron supplement after his procedure. He is also taking B12. He is only taking 81 mg aspirin at this time. He states his eliquis was discontinued. He had previously seen a vascular provider at [facility] for claudication but he needed to get his anemia stabilized prior to tackling that problem.   Overall, he states his energy is "marvelous". He is able to be more  active and perform his activities of daily living. He denies chest pain, current nausea, or dizziness. His shortness of breath is good. He sometimes has balance issuew when he stands up and may have mild lightheadedness. He is here for evaluation and repeat blood work.   MEDICAL HISTORY: Past Medical History:  Diagnosis Date   A-fib [facility])    Acute on chronic systolic (congestive) heart failure (HCC) 06/10/2017   Acute pulmonary edema (HCC)    Anemia    Aortic valve sclerosis 12/2018   Noted on ECHO   Arthritis    CAD (coronary artery disease)    a. 2006: CABG in 2006 with LIMA-LAD, SVG-OM1, and SVG-RPDA   Cardiomegaly 12/2018   Stable, noted on CXR   Carpal tunnel syndrome    Right   Chronic pain 03/21/2016   Colon cancer (HCC) 2006   COPD (chronic obstructive pulmonary disease) (HCC)    Diabetes mellitus without complication (HCC)    DVT (deep venous thrombosis) (HCC)    Right   Essential hypertension 03/21/2016   GERD (gastroesophageal reflux disease)    History of blood transfusion    History of Clostridioides difficile infection    History of prostate cancer 03/21/2016   History of PSVT (paroxysmal supraventricular tachycardia)    HLD (hyperlipidemia)    HTN (hypertension)    Hx of CABG 2006   Hypercholesteremia 03/21/2016   Hypothyroidism    LAE (left atrial enlargement) 12/2018   Severe, Noted on  ECHO   LVH (left ventricular hypertrophy) 12/2018   Mild, Noted on ECHO   Medication management 03/21/2016   Mitral annular calcification 12/2018   with mild MS, Noted on ECHO   Morbid obesity (HCC) 03/21/2016   Myocardial infarct (HCC)    OSA (obstructive sleep apnea) 03/21/2016   uses oxygen at night time   Pain in right ankle and joints of right foot 03/21/2016   Paronychia 03/21/2016   Phimosis    Pneumonia    Primary insomnia 03/21/2016   Prostate cancer (HCC) 2008   Pulmonary hypertension (HCC) 12/2018   Moderate, Noted on ECHO   Tobacco dependence 03/21/2016    Tricuspid regurgitation 12/2018   Mild, Noted on ECHO    ALLERGIES:  is allergic to shellfish allergy and fluzone [influenza virus vaccine].  MEDICATIONS:  Current Outpatient Medications  Medication Sig Dispense Refill   acetaminophen (TYLENOL) 650 MG CR tablet Take 1,300 mg by mouth every 8 (eight) hours as needed for pain.     aspirin EC 81 MG tablet Take 1 tablet (81 mg total) by mouth daily. Swallow whole. 90 tablet 3   atorvastatin (LIPITOR) 80 MG tablet Take 80 mg by mouth daily.     benzonatate (TESSALON) 200 MG capsule Take 200 mg by mouth 3 (three) times daily as needed.     budesonide (PULMICORT) 0.5 MG/2ML nebulizer solution Take 2 mLs (0.5 mg total) by nebulization 2 (two) times daily. 120 mL 11   cetirizine (ZYRTEC) 10 MG tablet Take 10 mg by mouth daily.     colchicine 0.6 MG tablet Take 0.6 mg by mouth daily as needed (Gout).     dexlansoprazole (DEXILANT) 60 MG capsule Take 60 mg by mouth daily as needed.     dicyclomine (BENTYL) 10 MG capsule Take 10 mg by mouth 3 (three) times daily before meals. 30 minutes before eating     ezetimibe (ZETIA) 10 MG tablet Take 1 tablet (10 mg total) by mouth daily. 90 tablet 3   ferrous sulfate 325 (65 FE) MG EC tablet Take 325 mg by mouth daily with breakfast.     fluticasone (FLONASE) 50 MCG/ACT nasal spray SHAKE LIQUID AND USE 1 SPRAY IN EACH NOSTRIL DAILY (Patient taking differently: Place 1 spray into both nostrils daily.) 16 g 2   furosemide (LASIX) 40 MG tablet Take 2 tablets (80 mg total) by mouth daily. (Patient taking differently: Take 80 mg by mouth 2 (two) times daily.) 30 tablet 0   glimepiride (AMARYL) 2 MG tablet Take 2 mg by mouth every morning.     guaiFENesin-codeine 100-10 MG/5ML syrup Take 5 mLs by mouth every 6 (six) hours as needed.     ketoconazole (NIZORAL) 2 % shampoo Apply 1 Application topically daily.     levothyroxine (SYNTHROID, LEVOTHROID) 88 MCG tablet Take 88 mcg by mouth daily before breakfast. Take on  an empty stomach     meclizine (ANTIVERT) 25 MG tablet Take 1 tablet (25 mg total) by mouth 3 (three) times daily as needed for dizziness. 30 tablet 0   metFORMIN (GLUCOPHAGE-XR) 500 MG 24 hr tablet Take 1,000 mg by mouth daily.     omeprazole (PRILOSEC) 40 MG capsule Take 40 mg by mouth daily.     Potassium Chloride ER 20 MEQ TBCR Take 1 tablet by mouth daily.     revefenacin (YUPELRI) 175 MCG/3ML nebulizer solution Take 175 mcg by nebulization in the morning and at bedtime.     spironolactone (ALDACTONE) 25 MG tablet  Take 1 tablet (25 mg total) by mouth daily. 30 tablet 0   No current facility-administered medications for this visit.    SURGICAL HISTORY:  Past Surgical History:  Procedure Laterality Date   ANKLE SURGERY Right 12/2013   CARDIAC CATHETERIZATION N/A 05/08/2016   Procedure: Left Heart Cath and Cors/Grafts Angiography;  Surgeon: Lyn Records, MD;  Location: [facility] INVASIVE CV LAB;  Service: Cardiovascular;  Laterality: N/A;   CIRCUMCISION N/A 10/07/2019   Procedure: CIRCUMCISION ADULT;  Surgeon: Crist Fat, MD;  Location: [facility] ORS;  Service: Urology;  Laterality: N/A;   CORONARY ARTERY BYPASS GRAFT  2006   x3   ELECTROPHYSIOLOGIC STUDY N/A 08/24/2016   Procedure: SVT Ablation;  Surgeon: Will Jorja Loa, MD;  Location: [facility] INVASIVE CV LAB;  Service: Cardiovascular;  Laterality: N/A;   PACEMAKER IMPLANT N/A 08/23/2023   Procedure: PACEMAKER IMPLANT;  Surgeon: Regan Lemming, MD;  Location: [facility] INVASIVE CV LAB;  Service: Cardiovascular;  Laterality: N/A;   PROSTATE SURGERY  2008   PTCA     UMBILICAL HERNIA REPAIR N/A 05/26/2022   Procedure: PRIMARY REPAIR OF STRANGULATED UMBILICAL HERNIA WITH PARTIAL OMENTECTOMY;  Surgeon: Almond Lint, MD;  Location: [facility] OR;  Service: General;  Laterality: N/A;    REVIEW OF SYSTEMS:   Constitutional: Positive for improved fatigue. Negative for appetite change, chills,  fever and unexpected weight change.  HENT: Negative for mouth  sores, nosebleeds, sore throat and trouble swallowing.   Eyes: Negative for eye problems and icterus.  Respiratory: Positive improved dyspnea on exertion. Negative for cough, hemoptysis, and wheezing.   Cardiovascular: Negative for chest pain and leg swelling.  Gastrointestinal: Negative for abdominal pain, constipation, diarrhea, nausea and vomiting.  Genitourinary: Negative for bladder incontinence, difficulty urinating, dysuria, frequency and hematuria.   Musculoskeletal: Positive for claudication with walking distances. Negative for back pain, gait problem, neck pain and neck stiffness.  Skin: Negative for itching and rash.  Neurological: Positive for occasional lightheadedness. Negative for dizziness, extremity weakness, gait problem, headaches, and seizures.  Hematological: Negative for adenopathy. Does not bruise/bleed easily.  Psychiatric/Behavioral: Negative for confusion, depression and sleep disturbance. The patient is not nervous/anxious.      PHYSICAL EXAMINATION:  There were no vitals taken for this visit.  ECOG PERFORMANCE STATUS: 1  Physical Exam  Constitutional: Oriented to person, place, and time and well-developed, well-nourished, and in no distress.  HENT:  Head: Normocephalic and atraumatic.  Mouth/Throat: Oropharynx is clear and moist. No oropharyngeal exudate.  Eyes: Conjunctivae are normal. Right eye exhibits no discharge. Left eye exhibits no discharge. No scleral icterus.  Neck: Normal range of motion. Neck supple.  Cardiovascular: Normal rate, regular rhythm, faint murmur noted in the 2nd right intercostal space and intact distal pulses.   Pulmonary/Chest: Effort normal. Quiet breath sounds bilaterally. No respiratory distress. No wheezes. No rales.  Abdominal: Soft. Bowel sounds are normal. Exhibits no distension and no mass. There is no tenderness.  Musculoskeletal: Normal range of motion. Exhibits no edema.  Lymphadenopathy:    No cervical adenopathy.   Neurological: Alert and oriented to person, place, and time. Exhibits normal muscle tone. Gait normal. Coordination normal.  Skin: Skin is warm and dry. No rash noted. Not diaphoretic. No erythema. No pallor.  Psychiatric: Mood, memory and judgment normal.  Vitals reviewed.  LABORATORY DATA: Lab Results  Component Value Date   WBC 4.5 01/08/2024   HGB 11.5 (L) 01/08/2024   HCT 37.2 (L) 01/08/2024   MCV 85.9 01/08/2024  PLT 206 01/08/2024      Chemistry      Component Value Date/Time   NA 135 12/18/2023 1732   NA 138 11/12/2023 1027   K 4.6 12/18/2023 1732   CL 96 (L) 12/18/2023 1732   CO2 27 12/18/2023 1732   BUN 23 12/18/2023 1732   BUN 19 11/12/2023 1027   CREATININE 0.94 12/18/2023 1732   CREATININE 0.98 12/18/2023 1235      Component Value Date/Time   CALCIUM 9.3 12/18/2023 1732   ALKPHOS 63 12/18/2023 1732   AST 40 12/18/2023 1732   AST 15 12/18/2023 1235   ALT 17 12/18/2023 1732   ALT 12 12/18/2023 1235   BILITOT 1.5 (H) 12/18/2023 1732   BILITOT 0.5 12/18/2023 1235       RADIOGRAPHIC STUDIES:  CT ANGIO HEAD NECK W WO CM Result Date: 12/19/2023 CLINICAL DATA:  Nausea, vomiting and dizziness EXAM: CT ANGIOGRAPHY HEAD AND NECK WITH AND WITHOUT CONTRAST TECHNIQUE: Multidetector CT imaging of the head and neck was performed using the standard protocol during bolus administration of intravenous contrast. Multiplanar CT image reconstructions and MIPs were obtained to evaluate the vascular anatomy. Carotid stenosis measurements (when applicable) are obtained utilizing NASCET criteria, using the distal internal carotid diameter as the denominator. RADIATION DOSE REDUCTION: This exam was performed according to the departmental dose-optimization program which includes automated exposure control, adjustment of the mA and/or kV according to patient size and/or use of iterative reconstruction technique. CONTRAST:  75mL OMNIPAQUE IOHEXOL 350 MG/ML SOLN COMPARISON:  None  Available. FINDINGS: CT HEAD FINDINGS Brain: There is no mass, hemorrhage or extra-axial collection. The size and configuration of the ventricles and extra-axial CSF spaces are normal. There is hypoattenuation of the white matter, most commonly indicating chronic small vessel disease. Vascular: Atherosclerotic calcification of the vertebral and internal carotid arteries at the skull base. No abnormal hyperdensity of the major intracranial arteries or dural venous sinuses. Skull: The visualized skull base, calvarium and extracranial soft tissues are normal. Sinuses/Orbits: No fluid levels or advanced mucosal thickening of the visualized paranasal sinuses. No mastoid or middle ear effusion. Normal orbits. CTA NECK FINDINGS Skeleton: No acute abnormality or high grade bony spinal canal stenosis. Other neck: Normal pharynx, larynx and major salivary glands. No cervical lymphadenopathy. Unremarkable thyroid gland. Upper chest: No pneumothorax or pleural effusion. No nodules or masses. Aortic arch: There is calcific atherosclerosis of the aortic arch. Conventional 3 vessel aortic branching pattern. RIGHT carotid system: Extensive atherosclerotic calcification throughout the common carotid artery extending into the proximal ICA causing 50% stenosis. LEFT carotid system: Multifocal calcific atherosclerosis of the common carotid artery with mixed density disease at the proximal ICA resulting in less than 50% stenosis. Vertebral arteries: Right dominant configuration. There is multifocal atherosclerotic calcification of both vertebral arteries. There is mild stenosis of the proximal left P1 segment. Otherwise but no hemodynamically significant stenosis to the level of the skull base. CTA HEAD FINDINGS POSTERIOR CIRCULATION: Atherosclerotic calcification of both V4 segments resulting in moderate stenosis bilaterally. No proximal occlusion of the anterior or inferior cerebellar arteries. Mild atherosclerosis of the basilar  artery without significant stenosis. Superior cerebellar arteries are normal. Posterior cerebral arteries are normal. ANTERIOR CIRCULATION: Atherosclerotic calcification of both internal carotid arteries at the skull base resulting in severe stenosis of the left distal cavernous segment and moderate right cavernous segment stenosis. Anterior cerebral arteries are normal. Middle cerebral arteries are normal. Venous sinuses: As permitted by contrast timing, patent. Anatomic variants: None Review of the  MIP images confirms the above findings. IMPRESSION: 1. No emergent large vessel occlusion. Widespread cerebrovascular atherosclerosis. 2. Severe stenosis of the left ICA distal cavernous segment and moderate right ICA cavernous segment stenosis. 3. Moderate stenosis of both vertebral artery V4 segments. 4. A 50% stenosis of the proximal right ICA. Aortic Atherosclerosis (ICD10-I70.0). Electronically Signed   By: Deatra Robinson M.D.   On: 12/19/2023 03:36     ASSESSMENT/PLAN:  This is a very pleasant 78 year old Caucasian male with iron deficiency anemia secondary to presumed GI blood loss.   He is receiving frequent blood transfusions.  He also receives IV iron infusions on an as-needed basis the most recent on 12/17/23 with Feraheme. He had some dizziness with this and Dr. Arbutus Ped would consider prednisone or solumedrol for pre-medications moving forward.   Labs were reviewed today.  His hemoglobin is 11.6. He does not need a blood transfusion today.  He would consider blood transfusion if his hemoglobin were less than 8.    He is able to tell when his hemoglobin is low and knows he is always welcome to call us sooner for same-day lab appointment.  I will place standing orders for blood bank hold's.   He will see Dr. Faylene Million at [facility] for procedure next week on 01/21/24.   Once he is able to resume taking his iron supplement, I would recommend he take this for maintenance to hopefully space out how frequently  he is requiring a blood transfusion or iron infusion. Since he had some intolerance with fereheme in the past, should he require IV iron, we can consider adding premedications and changing to venofer.   We will see him back in 2 weeks just to ensure stabilization in his Hbg considering just a month ago he was requiring frequent blood and iron infusions and Hbg were in the 7-8 range  The patient was advised to call immediately if he has any concerning symptoms in the interval. The patient voices understanding of current disease status and treatment options and is in agreement with the current care plan. All questions were answered. The patient knows to call the clinic with any problems, questions or concerns. We can certainly see the patient much sooner if necessary  No orders of the defined types were placed in this encounter.    The total time spent in the appointment was 20-29 minutes  Anothy Bufano L Blanchie Zeleznik, PA-C 01/14/24

## 2024-01-15 ENCOUNTER — Encounter: Payer: Self-pay | Admitting: Internal Medicine

## 2024-01-16 ENCOUNTER — Telehealth: Payer: Self-pay | Admitting: Internal Medicine

## 2024-01-16 NOTE — Telephone Encounter (Signed)
Patient is requesting call back to discuss procedure for a double endoscopic w/ devices. Requesting call back for advice on possible procedure he is wanting to have done.

## 2024-01-16 NOTE — Telephone Encounter (Signed)
Left message for patient to return the call.

## 2024-01-17 ENCOUNTER — Inpatient Hospital Stay: Payer: Federal, State, Local not specified - PPO

## 2024-01-17 ENCOUNTER — Inpatient Hospital Stay (HOSPITAL_BASED_OUTPATIENT_CLINIC_OR_DEPARTMENT_OTHER): Payer: Federal, State, Local not specified - PPO | Admitting: Physician Assistant

## 2024-01-17 DIAGNOSIS — D649 Anemia, unspecified: Secondary | ICD-10-CM

## 2024-01-17 DIAGNOSIS — D500 Iron deficiency anemia secondary to blood loss (chronic): Secondary | ICD-10-CM | POA: Diagnosis not present

## 2024-01-17 LAB — CBC WITH DIFFERENTIAL (CANCER CENTER ONLY)
Abs Immature Granulocytes: 0.02 10*3/uL (ref 0.00–0.07)
Basophils Absolute: 0 10*3/uL (ref 0.0–0.1)
Basophils Relative: 1 %
Eosinophils Absolute: 0.2 10*3/uL (ref 0.0–0.5)
Eosinophils Relative: 4 %
HCT: 37.8 % — ABNORMAL LOW (ref 39.0–52.0)
Hemoglobin: 11.6 g/dL — ABNORMAL LOW (ref 13.0–17.0)
Immature Granulocytes: 0 %
Lymphocytes Relative: 25 %
Lymphs Abs: 1.3 10*3/uL (ref 0.7–4.0)
MCH: 26.9 pg (ref 26.0–34.0)
MCHC: 30.7 g/dL (ref 30.0–36.0)
MCV: 87.5 fL (ref 80.0–100.0)
Monocytes Absolute: 0.5 10*3/uL (ref 0.1–1.0)
Monocytes Relative: 10 %
Neutro Abs: 3 10*3/uL (ref 1.7–7.7)
Neutrophils Relative %: 60 %
Platelet Count: 208 10*3/uL (ref 150–400)
RBC: 4.32 MIL/uL (ref 4.22–5.81)
RDW: 21.8 % — ABNORMAL HIGH (ref 11.5–15.5)
WBC Count: 5 10*3/uL (ref 4.0–10.5)
nRBC: 0 % (ref 0.0–0.2)

## 2024-01-17 LAB — SAMPLE TO BLOOD BANK

## 2024-01-17 LAB — IRON AND IRON BINDING CAPACITY (CC-WL,HP ONLY)
Iron: 72 ug/dL (ref 45–182)
Saturation Ratios: 14 % — ABNORMAL LOW (ref 17.9–39.5)
TIBC: 500 ug/dL — ABNORMAL HIGH (ref 250–450)
UIBC: 428 ug/dL — ABNORMAL HIGH (ref 117–376)

## 2024-01-17 LAB — FERRITIN: Ferritin: 21 ng/mL — ABNORMAL LOW (ref 24–336)

## 2024-01-17 NOTE — Telephone Encounter (Signed)
Pt returning call, requesting cb

## 2024-01-17 NOTE — Telephone Encounter (Signed)
Spoke to patient he stated he will be having a endoscopy done at [facility] next Tue.2/18.He wanted to know if we received a clearance.Advised I did not see a clearance.He will call GI Dr.and have them fax a clearance.

## 2024-01-21 ENCOUNTER — Inpatient Hospital Stay: Payer: Federal, State, Local not specified - PPO

## 2024-01-31 ENCOUNTER — Encounter: Payer: Self-pay | Admitting: Internal Medicine

## 2024-01-31 ENCOUNTER — Ambulatory Visit (HOSPITAL_COMMUNITY): Payer: Federal, State, Local not specified - PPO | Attending: Internal Medicine

## 2024-01-31 ENCOUNTER — Other Ambulatory Visit: Payer: Self-pay | Admitting: Medical Oncology

## 2024-01-31 DIAGNOSIS — I480 Paroxysmal atrial fibrillation: Secondary | ICD-10-CM | POA: Diagnosis present

## 2024-01-31 DIAGNOSIS — I342 Nonrheumatic mitral (valve) stenosis: Secondary | ICD-10-CM | POA: Diagnosis present

## 2024-01-31 DIAGNOSIS — D649 Anemia, unspecified: Secondary | ICD-10-CM

## 2024-01-31 DIAGNOSIS — E1169 Type 2 diabetes mellitus with other specified complication: Secondary | ICD-10-CM | POA: Insufficient documentation

## 2024-01-31 DIAGNOSIS — K922 Gastrointestinal hemorrhage, unspecified: Secondary | ICD-10-CM | POA: Diagnosis present

## 2024-01-31 DIAGNOSIS — D500 Iron deficiency anemia secondary to blood loss (chronic): Secondary | ICD-10-CM

## 2024-01-31 DIAGNOSIS — Z951 Presence of aortocoronary bypass graft: Secondary | ICD-10-CM | POA: Diagnosis present

## 2024-01-31 DIAGNOSIS — I152 Hypertension secondary to endocrine disorders: Secondary | ICD-10-CM | POA: Insufficient documentation

## 2024-01-31 DIAGNOSIS — I442 Atrioventricular block, complete: Secondary | ICD-10-CM | POA: Diagnosis present

## 2024-01-31 DIAGNOSIS — I700 Atherosclerosis of aorta: Secondary | ICD-10-CM | POA: Insufficient documentation

## 2024-01-31 DIAGNOSIS — E785 Hyperlipidemia, unspecified: Secondary | ICD-10-CM | POA: Insufficient documentation

## 2024-01-31 DIAGNOSIS — E1159 Type 2 diabetes mellitus with other circulatory complications: Secondary | ICD-10-CM | POA: Diagnosis present

## 2024-01-31 LAB — ECHOCARDIOGRAM COMPLETE
Area-P 1/2: 3.93 cm2
MV VTI: 1.3 cm2
S' Lateral: 2.5 cm

## 2024-01-31 NOTE — Progress Notes (Signed)
 CBC

## 2024-02-03 ENCOUNTER — Inpatient Hospital Stay: Payer: Federal, State, Local not specified - PPO | Attending: Physician Assistant | Admitting: Internal Medicine

## 2024-02-03 ENCOUNTER — Inpatient Hospital Stay: Payer: Federal, State, Local not specified - PPO

## 2024-02-03 VITALS — BP 131/68 | HR 114 | Temp 98.0°F | Resp 18 | Wt 212.0 lb

## 2024-02-03 DIAGNOSIS — Z8546 Personal history of malignant neoplasm of prostate: Secondary | ICD-10-CM | POA: Diagnosis not present

## 2024-02-03 DIAGNOSIS — D500 Iron deficiency anemia secondary to blood loss (chronic): Secondary | ICD-10-CM | POA: Insufficient documentation

## 2024-02-03 DIAGNOSIS — D649 Anemia, unspecified: Secondary | ICD-10-CM

## 2024-02-03 DIAGNOSIS — D539 Nutritional anemia, unspecified: Secondary | ICD-10-CM | POA: Diagnosis not present

## 2024-02-03 DIAGNOSIS — K922 Gastrointestinal hemorrhage, unspecified: Secondary | ICD-10-CM | POA: Diagnosis not present

## 2024-02-03 DIAGNOSIS — Z85038 Personal history of other malignant neoplasm of large intestine: Secondary | ICD-10-CM | POA: Insufficient documentation

## 2024-02-03 DIAGNOSIS — I739 Peripheral vascular disease, unspecified: Secondary | ICD-10-CM | POA: Diagnosis not present

## 2024-02-03 LAB — CMP (CANCER CENTER ONLY)
ALT: 16 U/L (ref 0–44)
AST: 16 U/L (ref 15–41)
Albumin: 4.6 g/dL (ref 3.5–5.0)
Alkaline Phosphatase: 88 U/L (ref 38–126)
Anion gap: 8 (ref 5–15)
BUN: 19 mg/dL (ref 8–23)
CO2: 32 mmol/L (ref 22–32)
Calcium: 9.7 mg/dL (ref 8.9–10.3)
Chloride: 99 mmol/L (ref 98–111)
Creatinine: 0.98 mg/dL (ref 0.61–1.24)
GFR, Estimated: >60 mL/min (ref >60)
Glucose, Bld: 191 mg/dL — ABNORMAL HIGH (ref 70–99)
Potassium: 4.4 mmol/L (ref 3.5–5.1)
Sodium: 139 mmol/L (ref 135–145)
Total Bilirubin: 0.4 mg/dL (ref 0.0–1.2)
Total Protein: 7.9 g/dL (ref 6.5–8.1)

## 2024-02-03 LAB — CBC WITH DIFFERENTIAL (CANCER CENTER ONLY)
Abs Immature Granulocytes: 0.03 10*3/uL (ref 0.00–0.07)
Basophils Absolute: 0 10*3/uL (ref 0.0–0.1)
Basophils Relative: 1 %
Eosinophils Absolute: 0.2 10*3/uL (ref 0.0–0.5)
Eosinophils Relative: 4 %
HCT: 38.9 % — ABNORMAL LOW (ref 39.0–52.0)
Hemoglobin: 11.9 g/dL — ABNORMAL LOW (ref 13.0–17.0)
Immature Granulocytes: 1 %
Lymphocytes Relative: 26 %
Lymphs Abs: 1.2 10*3/uL (ref 0.7–4.0)
MCH: 27.5 pg (ref 26.0–34.0)
MCHC: 30.6 g/dL (ref 30.0–36.0)
MCV: 89.8 fL (ref 80.0–100.0)
Monocytes Absolute: 0.4 10*3/uL (ref 0.1–1.0)
Monocytes Relative: 10 %
Neutro Abs: 2.6 10*3/uL (ref 1.7–7.7)
Neutrophils Relative %: 58 %
Platelet Count: 185 10*3/uL (ref 150–400)
RBC: 4.33 MIL/uL (ref 4.22–5.81)
RDW: 20.7 % — ABNORMAL HIGH (ref 11.5–15.5)
WBC Count: 4.4 10*3/uL (ref 4.0–10.5)
nRBC: 0 % (ref 0.0–0.2)

## 2024-02-03 LAB — SAMPLE TO BLOOD BANK

## 2024-02-03 NOTE — Progress Notes (Signed)
 [facility] [phone]   [phone]  OFFICE PROGRESS NOTE  Koirala, Dibas, MD [address] [place] [address]  DIAGNOSIS: Iron deficiency anemia secondary to chronic gastrointestinal hemorrhage    PRIOR THERAPY:  1) Iron infusion with Ferrlecit 250 Mg IV for 4 doses. Last dose was given on 06/09/2023  2) IV iron infusion with feraheme, last dose on 10/21/23   CURRENT THERAPY: Oral ferrous sulfate with orange juice as well as oral vitamin B12 1000 mcg p.o. daily and blood transfusions PRN to keep Hbg >8.   INTERVAL HISTORY: Jeremiah [name] 78 y.o. male returns to the clinic today for follow up visit. Discussed the use of AI scribe software for clinical note transcription with the patient, who gave verbal consent to proceed.  History of Present Illness   Jeremiah [name] is a 78 year old male with iron deficiency anemia secondary to gastrointestinal blood loss who presents for follow-up.  He is currently being followed up after receiving iron infusions and blood transfusions. He feels great today and has been on oral ferrous sulfate. His hemoglobin has improved to 11.9 from previous levels of 6 and 7. He is also taking B12 tablets. No current bleeding and notes an improvement in his energy levels.  He mentions a persistent clot and blockage in his legs as his only complaint. No changes in appetite or bowel movements, which he states are good.      MEDICAL HISTORY: Past Medical History:  Diagnosis Date   A-fib [facility]    Acute on chronic systolic (congestive) heart failure (HCC) 06/10/2017   Acute pulmonary edema (HCC)    Anemia    Aortic valve sclerosis 12/2018   Noted on ECHO   Arthritis    CAD (coronary artery disease)    a. 2006: CABG in 2006 with LIMA-LAD, SVG-OM1, and SVG-RPDA   Cardiomegaly 12/2018   Stable, noted on CXR   Carpal tunnel syndrome    Right   Chronic pain 03/21/2016   Colon cancer (HCC) 2006   COPD  (chronic obstructive pulmonary disease) (HCC)    Diabetes mellitus without complication (HCC)    DVT (deep venous thrombosis) (HCC)    Right   Essential hypertension 03/21/2016   GERD (gastroesophageal reflux disease)    History of blood transfusion    History of Clostridioides difficile infection    History of prostate cancer 03/21/2016   History of PSVT (paroxysmal supraventricular tachycardia)    HLD (hyperlipidemia)    HTN (hypertension)    Hx of CABG 2006   Hypercholesteremia 03/21/2016   Hypothyroidism    LAE (left atrial enlargement) 12/2018   Severe, Noted on ECHO   LVH (left ventricular hypertrophy) 12/2018   Mild, Noted on ECHO   Medication management 03/21/2016   Mitral annular calcification 12/2018   with mild MS, Noted on ECHO   Morbid obesity (HCC) 03/21/2016   Myocardial infarct (HCC)    OSA (obstructive sleep apnea) 03/21/2016   uses oxygen at night time   Pain in right ankle and joints of right foot 03/21/2016   Paronychia 03/21/2016   Phimosis    Pneumonia    Primary insomnia 03/21/2016   Prostate cancer (HCC) 2008   Pulmonary hypertension (HCC) 12/2018   Moderate, Noted on ECHO   Tobacco dependence 03/21/2016   Tricuspid regurgitation 12/2018   Mild, Noted on ECHO    ALLERGIES:  is allergic to shellfish allergy and fluzone [influenza virus vaccine].  MEDICATIONS:  Current Outpatient Medications  Medication Sig Dispense Refill   acetaminophen (TYLENOL) 650 MG CR tablet Take 1,300 mg by mouth every 8 (eight) hours as needed for pain.     aspirin EC 81 MG tablet Take 1 tablet (81 mg total) by mouth daily. Swallow whole. 90 tablet 3   atorvastatin (LIPITOR) 80 MG tablet Take 80 mg by mouth daily.     benzonatate (TESSALON) 200 MG capsule Take 200 mg by mouth 3 (three) times daily as needed.     budesonide (PULMICORT) 0.5 MG/2ML nebulizer solution Take 2 mLs (0.5 mg total) by nebulization 2 (two) times daily. 120 mL 11   cetirizine (ZYRTEC) 10 MG tablet  Take 10 mg by mouth daily.     colchicine 0.6 MG tablet Take 0.6 mg by mouth daily as needed (Gout).     dexlansoprazole (DEXILANT) 60 MG capsule Take 60 mg by mouth daily as needed.     dicyclomine (BENTYL) 10 MG capsule Take 10 mg by mouth 3 (three) times daily before meals. 30 minutes before eating     ezetimibe (ZETIA) 10 MG tablet Take 1 tablet (10 mg total) by mouth daily. 90 tablet 3   ferrous sulfate 325 (65 FE) MG EC tablet Take 325 mg by mouth daily with breakfast.     fluticasone (FLONASE) 50 MCG/ACT nasal spray SHAKE LIQUID AND USE 1 SPRAY IN EACH NOSTRIL DAILY (Patient taking differently: Place 1 spray into both nostrils daily.) 16 g 2   furosemide (LASIX) 40 MG tablet Take 2 tablets (80 mg total) by mouth daily. (Patient taking differently: Take 80 mg by mouth 2 (two) times daily.) 30 tablet 0   glimepiride (AMARYL) 2 MG tablet Take 2 mg by mouth every morning.     guaiFENesin-codeine 100-10 MG/5ML syrup Take 5 mLs by mouth every 6 (six) hours as needed.     ketoconazole (NIZORAL) 2 % shampoo Apply 1 Application topically daily.     levothyroxine (SYNTHROID, LEVOTHROID) 88 MCG tablet Take 88 mcg by mouth daily before breakfast. Take on an empty stomach     meclizine (ANTIVERT) 25 MG tablet Take 1 tablet (25 mg total) by mouth 3 (three) times daily as needed for dizziness. 30 tablet 0   metFORMIN (GLUCOPHAGE-XR) 500 MG 24 hr tablet Take 1,000 mg by mouth daily.     omeprazole (PRILOSEC) 40 MG capsule Take 40 mg by mouth daily.     Potassium Chloride ER 20 MEQ TBCR Take 1 tablet by mouth daily.     revefenacin (YUPELRI) 175 MCG/3ML nebulizer solution Take 175 mcg by nebulization in the morning and at bedtime.     spironolactone (ALDACTONE) 25 MG tablet Take 1 tablet (25 mg total) by mouth daily. 30 tablet 0   No current facility-administered medications for this visit.    SURGICAL HISTORY:  Past Surgical History:  Procedure Laterality Date   ANKLE SURGERY Right 12/2013   CARDIAC  CATHETERIZATION N/A 05/08/2016   Procedure: Left Heart Cath and Cors/Grafts Angiography;  Surgeon: Lyn Records, MD;  Location: [facility] INVASIVE CV LAB;  Service: Cardiovascular;  Laterality: N/A;   CIRCUMCISION N/A 10/07/2019   Procedure: CIRCUMCISION ADULT;  Surgeon: Crist Fat, MD;  Location: [facility] ORS;  Service: Urology;  Laterality: N/A;   CORONARY ARTERY BYPASS GRAFT  2006   x3   ELECTROPHYSIOLOGIC STUDY N/A 08/24/2016   Procedure: SVT Ablation;  Surgeon: Will Jorja Loa, MD;  Location: [facility] INVASIVE CV LAB;  Service: Cardiovascular;  Laterality:  N/A;   PACEMAKER IMPLANT N/A 08/23/2023   Procedure: PACEMAKER IMPLANT;  Surgeon: Regan Lemming, MD;  Location: [facility] INVASIVE CV LAB;  Service: Cardiovascular;  Laterality: N/A;   PROSTATE SURGERY  2008   PTCA     UMBILICAL HERNIA REPAIR N/A 05/26/2022   Procedure: PRIMARY REPAIR OF STRANGULATED UMBILICAL HERNIA WITH PARTIAL OMENTECTOMY;  Surgeon: Almond Lint, MD;  Location: [facility] OR;  Service: General;  Laterality: N/A;    REVIEW OF SYSTEMS:  A comprehensive review of systems was negative.   PHYSICAL EXAMINATION: General appearance: alert, cooperative, and no distress Head: Normocephalic, without obvious abnormality, atraumatic Neck: no adenopathy, no JVD, supple, symmetrical, trachea midline, and thyroid not enlarged, symmetric, no tenderness/mass/nodules Lymph nodes: Cervical, supraclavicular, and axillary nodes normal. Resp: clear to auscultation bilaterally Back: symmetric, no curvature. ROM normal. No CVA tenderness. Cardio: regular rate and rhythm, S1, S2 normal, no murmur, click, rub or gallop GI: soft, non-tender; bowel sounds normal; no masses,  no organomegaly Extremities: extremities normal, atraumatic, no cyanosis or edema  ECOG PERFORMANCE STATUS: 1 - Symptomatic but completely ambulatory  Blood pressure 131/68, pulse (!) 114, temperature 98 F (36.7 C), temperature source Temporal, resp. rate 18, weight 212 lb (96.2 kg),  SpO2 97%.  LABORATORY DATA: Lab Results  Component Value Date   WBC 4.4 02/03/2024   HGB 11.9 (L) 02/03/2024   HCT 38.9 (L) 02/03/2024   MCV 89.8 02/03/2024   PLT 185 02/03/2024      Chemistry      Component Value Date/Time   NA 135 12/18/2023 1732   NA 138 11/12/2023 1027   K 4.6 12/18/2023 1732   CL 96 (L) 12/18/2023 1732   CO2 27 12/18/2023 1732   BUN 23 12/18/2023 1732   BUN 19 11/12/2023 1027   CREATININE 0.94 12/18/2023 1732   CREATININE 0.98 12/18/2023 1235      Component Value Date/Time   CALCIUM 9.3 12/18/2023 1732   ALKPHOS 63 12/18/2023 1732   AST 40 12/18/2023 1732   AST 15 12/18/2023 1235   ALT 17 12/18/2023 1732   ALT 12 12/18/2023 1235   BILITOT 1.5 (H) 12/18/2023 1732   BILITOT 0.5 12/18/2023 1235       RADIOGRAPHIC STUDIES: ECHOCARDIOGRAM COMPLETE Result Date: 01/31/2024    ECHOCARDIOGRAM REPORT   Patient Name:   GLEASON ARDOIN   Date of Exam: 01/31/2024 Medical Rec #:  [id number]     Height:       68.0 in Accession #:    [id number]    Weight:       210.1 lb Date of Birth:  1946/11/06      BSA:          2.087 m Patient Age:    77 years      BP:           108/68 mmHg Patient Gender: M             HR:           105 bpm. Exam Location:  [address] Procedure: 2D Echo, Color Doppler and Cardiac Doppler (Both Spectral and Color            Flow Doppler were utilized during procedure). Indications:    I34.2 Nonrheumatic mitral (valve) stenosis  History:        Patient has prior history of Echocardiogram examinations, most                 recent 06/25/2023. CAD, Prior CABG and  Pacemaker,                 Arrythmias:Atrial Fibrillation; Risk Factors:Hypertension,                 Diabetes and Dyslipidemia.  Sonographer:    Thurman Coyer RDCS Referring Phys: [id number] Charlies Constable [facility] IMPRESSIONS  1. Left ventricular ejection fraction, by estimation, is 55 to 60%. The left ventricle has normal function. The left ventricle has no regional wall motion abnormalities. There is  moderate concentric left ventricular hypertrophy. Left ventricular diastolic parameters are indeterminate.  2. Right ventricular systolic function is normal. The right ventricular size is normal. There is mildly elevated pulmonary artery systolic pressure.  3. Left atrial size was severely dilated.  4. Mitral Valve Area (MVA) = 1.12 cm2 by continuity equation. The mitral valve is degenerative. No evidence of mitral valve regurgitation. Moderate to severe mitral stenosis. Severe mitral annular calcification.  5. The aortic valve is calcified. Aortic valve regurgitation is not visualized. Aortic valve sclerosis/calcification is present, without any evidence of aortic stenosis.  6. Aortic dilatation noted.  7. The inferior vena cava is normal in size with greater than 50% respiratory variability, suggesting right atrial pressure of 3 mmHg. Comparison(s): Prior images reviewed side by side. Prior study characterized the valve as a functionally bicuspid valve- this morprology is likely true but more difficult to evaluate on this study. Gradients have increased by at higher heart rate. FINDINGS  Left Ventricle: Left ventricular ejection fraction, by estimation, is 55 to 60%. The left ventricle has normal function. The left ventricle has no regional wall motion abnormalities. Strain imaging was not performed. The left ventricular internal cavity  size was normal in size. There is moderate concentric left ventricular hypertrophy. Left ventricular diastolic parameters are indeterminate. Right Ventricle: The right ventricular size is normal. No increase in right ventricular wall thickness. Right ventricular systolic function is normal. There is mildly elevated pulmonary artery systolic pressure. The tricuspid regurgitant velocity is 2.95  m/s, and with an assumed right atrial pressure of 3 mmHg, the estimated right ventricular systolic pressure is 37.8 mmHg. Left Atrium: Left atrial size was severely dilated. Right Atrium:  Right atrial size was normal in size. Pericardium: There is no evidence of pericardial effusion. Mitral Valve: Mitral Valve Area (MVA) = 1.12 cm2 by continuity equation. The mitral valve is degenerative in appearance. Severe mitral annular calcification. No evidence of mitral valve regurgitation. Moderate to severe mitral valve stenosis. MV peak gradient, 22.8 mmHg. The mean mitral valve gradient is 11.0 mmHg. Tricuspid Valve: The tricuspid valve is normal in structure. Tricuspid valve regurgitation is mild . No evidence of tricuspid stenosis. Aortic Valve: The aortic valve is calcified. Aortic valve regurgitation is not visualized. Aortic valve sclerosis/calcification is present, without any evidence of aortic stenosis. Pulmonic Valve: The pulmonic valve was normal in structure. Pulmonic valve regurgitation is not visualized. No evidence of pulmonic stenosis. Aorta: Aortic dilatation noted. Venous: The inferior vena cava is normal in size with greater than 50% respiratory variability, suggesting right atrial pressure of 3 mmHg. IAS/Shunts: The atrial septum is grossly normal. Additional Comments: 3D imaging was not performed.  LEFT VENTRICLE PLAX 2D LVIDd:         3.70 cm LVIDs:         2.50 cm LV PW:         1.40 cm LV IVS:        1.40 cm LVOT diam:     2.10 cm LV SV:  66 LV SV Index:   32 LVOT Area:     3.46 cm  RIGHT VENTRICLE RV Basal diam:  2.50 cm RV Mid diam:    3.00 cm RV S prime:     6.57 cm/s TAPSE (M-mode): 1.1 cm LEFT ATRIUM            Index        RIGHT ATRIUM           Index LA diam:      5.60 cm  2.68 cm/m   RA Area:     14.50 cm LA Vol (A2C): 103.0 ml 49.35 ml/m  RA Volume:   33.70 ml  16.15 ml/m LA Vol (A4C): 107.0 ml 51.27 ml/m  AORTIC VALVE LVOT Vmax:   130.00 cm/s LVOT Vmean:  84.000 cm/s LVOT VTI:    0.190 m  AORTA Ao Root diam: 3.10 cm MITRAL VALVE                TRICUSPID VALVE MV Area (PHT): 3.93 cm     TR Peak grad:   34.8 mmHg MV Area VTI:   1.30 cm     TR Vmax:         295.00 cm/s MV Peak grad:  22.8 mmHg MV Mean grad:  11.0 mmHg    SHUNTS MV Vmax:       2.39 m/s     Systemic VTI:  0.19 m MV Vmean:      159.0 cm/s   Systemic Diam: 2.10 cm MV Decel Time: 193 msec MV E velocity: 144.00 cm/s MV A velocity: 222.00 cm/s MV E/A ratio:  0.65 Riley Lam MD Electronically signed by Riley Lam MD Signature Date/Time: 01/31/2024/2:06:42 PM    Final     ASSESSMENT AND PLAN: This is a very pleasant 78 years old white male with history of iron deficiency anemia secondary to chronic gastrointestinal hemorrhage.  The patient has been on treatment with iron infusion as well as frequent PRBCs transfusion recently. He is currently on over-the-counter ferrous sulfate with vitamin C/orange juice as well as oral vitamin B12 and he is feeling fine. Repeat CBC today showed further improvement of his hemoglobin and hematocrit.  His hemoglobin is 11.9.    Iron Deficiency Anemia Iron deficiency anemia secondary to gastrointestinal blood loss. Hemoglobin improved to 11.9 from previous levels of 6-7. No current need for blood transfusion or iron infusion. Awaiting iron study results. Reports improved energy levels and no current bleeding. Discussed the importance of continuing oral iron and B12 supplementation to maintain hemoglobin levels. - Continue oral ferrous sulfate - Continue B12 tablets - Follow-up in one month for re-evaluation  Peripheral Arterial Disease Ongoing peripheral arterial disease with reported clot and blockage in legs. No changes in management discussed during this visit.  General Health Maintenance Reports good appetite, regular bowel movements, and improved energy levels. No current bleeding. - Monitor for any signs of weakness or bleeding and call if symptoms recur  Follow-up - Schedule follow-up appointment with Dr. Lavon Paganini - Schedule follow-up appointment with Dr. Faylene Million.   The patient was advised to call immediately if he has any other  concerning symptoms in the interval. The patient voices understanding of current disease status and treatment options and is in agreement with the current care plan.  All questions were answered. The patient knows to call the clinic with any problems, questions or concerns. We can certainly see the patient much sooner if necessary.  The total time spent in the  appointment was 20 minutes.  Disclaimer: This note was dictated with voice recognition software. Similar sounding words can inadvertently be transcribed and may not be corrected upon review.

## 2024-02-04 ENCOUNTER — Inpatient Hospital Stay: Payer: Federal, State, Local not specified - PPO

## 2024-02-10 ENCOUNTER — Ambulatory Visit: Payer: Federal, State, Local not specified - PPO | Attending: Internal Medicine | Admitting: Internal Medicine

## 2024-02-10 ENCOUNTER — Encounter: Payer: Self-pay | Admitting: Internal Medicine

## 2024-02-10 VITALS — BP 112/66 | HR 91 | Ht 68.0 in | Wt 213.8 lb

## 2024-02-10 DIAGNOSIS — Z951 Presence of aortocoronary bypass graft: Secondary | ICD-10-CM | POA: Diagnosis not present

## 2024-02-10 DIAGNOSIS — K922 Gastrointestinal hemorrhage, unspecified: Secondary | ICD-10-CM

## 2024-02-10 DIAGNOSIS — I342 Nonrheumatic mitral (valve) stenosis: Secondary | ICD-10-CM

## 2024-02-10 DIAGNOSIS — I739 Peripheral vascular disease, unspecified: Secondary | ICD-10-CM | POA: Diagnosis not present

## 2024-02-10 DIAGNOSIS — I480 Paroxysmal atrial fibrillation: Secondary | ICD-10-CM

## 2024-02-10 NOTE — Patient Instructions (Signed)
 Medication Instructions:  NO CHANGES  *If you need a refill on your cardiac medications before your next appointment, please call your pharmacy*   Follow-Up: At [facility], you and your health needs are our priority.  As part of our continuing mission to provide you with exceptional heart care, we have created designated Provider Care Teams.  These Care Teams include your primary Cardiologist (physician) and Advanced Practice Providers (APPs -  Physician Assistants and Nurse Practitioners) who all work together to provide you with the care you need, when you need it.  We recommend signing up for the patient portal called "MyChart".  Sign up information is provided on this After Visit Summary.  MyChart is used to connect with patients for Virtual Visits (Telemedicine).  Patients are able to view lab/test results, encounter notes, upcoming appointments, etc.  Non-urgent messages can be sent to your provider as well.   To learn more about what you can do with MyChart, go to [email or web address].    Your next appointment:   6 months with [name]  ** call in April or May for a September appointment  Other Instructions   1st Floor: - Lobby - Registration  - Pharmacy  - Lab - Cafe  2nd Floor: - PV Lab - Diagnostic Testing (echo, CT, nuclear med)  3rd Floor: - Vacant  4th Floor: - TCTS (cardiothoracic surgery) - AFib Clinic - Structural Heart Clinic - Vascular Surgery  - Vascular Ultrasound  5th Floor: - [facility] Cardiology (general and EP) - Clinical Pharmacy for coumadin, hypertension, lipid, weight-loss medications, and med management appointments    Valet parking services will be available as well.

## 2024-02-10 NOTE — Progress Notes (Unsigned)
 OFFICE NOTE  Chief Complaint:  Follow-up  Primary Care Physician: [name], MD  HPI:  Jeremiah [name] is a 78 y.o. 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 He is originally from [place] and his wife who is accompanying him today is originally from [place]. Mr. Bradsher had an MI in 2006 and underwent coronary artery bypass grafting with a LIMA to LAD, SVG to OM1, and SVG to RPDA. He did fairly well with this however 2015 was having chest discomfort. He underwent a nuclear stress test which showed possible reversible ischemia. Therefore he was referred for cardiac catheterization at the [place] [facility] [place]. That demonstrated all 3 patent bypass grafts with multivessel coronary disease. At that time he was also having episodes of palpitations and a monitor demonstrated PSVT. He was scheduled for possible ablation but is had chronic problems with an ankle fracture and nonhealing wound. He therefore never underwent ablation. He was placed on digoxin and has been on metoprolol but stopped the digoxin at some point in the past. He reports over the past 8-12 months she's had no further palpitations. He also has a history of dyslipidemia, hypothyroidism, and borderline diabetes. Recently he had a sleep study through [facility] physicians which demonstrated severe obstructive sleep apnea and BiPAP was recommended with settings of 19/14 cm water pressure. Currently he denies any chest pain or worsening shortness of breath. His last echocardiogram from his cardiologist in [place] was a 2015 which showed an EF of 58% and no significant valvular disease.  07/26/2016  Jeremiah [name] returns today for follow-up. He was recently seen in the emergency department in the beginning of June for chest pain. This was associated with tachycardia and probable SVT. Troponin was noted to be elevated to 0.12. He had signs and symptoms concerning for unstable angina. He was evaluated by my  partners and felt to need cardiac catheterization. Eventually he underwent cardiac catheterization by Dr. Katrinka Blazing with the results as follows:  Conclusion   LM lesion, 85% stenosed. Ost 1st Mrg lesion, 100% stenosed. SVG was injected . Ost RCA to Prox RCA lesion, 100% stenosed. SVG . Origin to Prox Graft lesion, 40% stenosed. Mid RCA to Dist RCA lesion, 100% stenosed. Prox LAD lesion, 100% stenosed. LIMA .   Patent saphenous vein grafts. SVG to PDA contains eccentric 40% proximal stenosis. SVG to the circumflex is widely patent. LIMA to the LAD is widely patent. Native distal left main contains 75% stenosis. The dominant obtuse marginal branch is totally occluded. The native right coronary is totally occluded. Left ventriculogram demonstrates inferior wall hypokinesis. EF 50%. Recent chest pain in the setting of PSVT with minimal enzyme elevation likely related to demand ischemia in the setting of underlying native coronary disease.   Recommendations: Medical therapy. Management of PSVT with medications versus ablation.   Since discharge she denies any further episodes of tachycardia palpitations. He is already on a high dose of beta blocker. He's had a small amount of weight gain but has stopped smoking.  He plans to start to do more exercise and work on weight loss. I'm concerned about his recurrent palpitations and the fact that asymptomatic and has demand ischemia related to them and bypass graft insufficiency.  06/10/2017  Jeremiah [name] returns today for follow-up. It's been almost a year since I last saw him. He was found to have recurrent symptomatic SVT and underwent comprehensive EP study and was diagnosed with classic AV nodal reentry tachycardia and  subsequently underwent selective radiofrequency ablation by Dr. Elberta Fortis. He says since that time he's had no further episodes of tachypalpitations. Since then he was hospitalized twice, after developing C. difficile colitis and enteritis  with GI bleed in January. Subsequently he developed acute congestive heart failure was found to have a mild reduction in LVEF to 45-50% by echo in March 2018. At that time he was on furosemide 40 mg daily. He had a follow-up with Azalee Course, PA-C in April who noted he was on Lasix 40 mg twice daily, however he reported that he was not taking that to me today. Fact he says he is now off of Lasix. He reports worsening shortness of breath and hasn't fact had a 13 pound weight gain since April. He denies any lower extremity edema. He has been working with Dr. Sherene Sires for his shortness of breath.  07/22/2017  Jeremiah [name] is back today for follow-up. He restarted his Lasix at my direction his weight is now down from 252-244. He's had significant improvement in swelling and reports some improvement in his breathing however he says it still not good. He does have a mildly reduced EF of 45-50% but also has some chronic lung disease which is likely contributing. He says he gets short of breath while doing certain activities in the yard and then comes inside and uses his oxygen which improves him almost immediately. He denies any orthopnea. He has not had any recent or productive cough. He has a follow-up visit with his pulmonologist tomorrow. I had ordered lab work including a metabolic profile and BNP which was never obtained.  09/09/2018  Jeremiah [name] is seen today in follow-up.  He reports some improvement in his shortness of breath.  He has joined a gym and now is exercising every day.  Weight however has been stable at 252, actually up 2 pounds from April.  Despite this his breathing has improved somewhat.  He still uses oxygen.  He did have one episode recently where he became nauseated and vomited.  He was up at the [facility] [place] and had been taking antibiotics for a dental infection.  He did undergo CT scan of the head and neck and that demonstrated bilateral carotid artery calcification.  This is not surprising given  his history of coronary artery disease and prior CABG however he has not had prior carotid Dopplers.    02/18/2019  Jeremiah [name] is seen today in follow-up.  Recently he says has had some improvement in shortness of breath.  Dr. Sherene Sires, his pulmonologist had started him on a new nebulizer called revefenacin.  His most recent hemoglobin A1c was 7.1.  Weight is down about 4 pounds.  He denies any new chest pain.  He has had some recent anemia and work-up is apparently underway for this.  He is tentatively scheduled for an endoscopy on April 16 with Dr. Marca Ancona.  He is concerned because of his pulmonary status that he may be at high risk of decompensation with the procedure.  He also has follow-up with his PCP in the near future.  09/14/2019  Jeremiah [name] returns today for follow-up.  He continues to do well.  His shortness of breath has improved which I think was both believed due to a combination of pulmonary optimization as well as improvement in LV function.  Recently his echo in fact showed LVEF has normalized to 60 to 65%.  He remains euvolemic and is actually 2 pounds lighter than he was seen about 8  months ago.  Hemoglobin A1c has been reasonably well-controlled however his lipids are still uncontrolled.  His total cholesterol is 211, triglycerides 494, HDL 32 and LDL of 114.  His target LDL is less than 70 and his elevated triglycerides also put him at increased cardiovascular risk.  03/02/2020  Jeremiah [name] is seen today in follow-up.  He denies any chest pain but does have some shortness of breath.  Is been some weight gain.  He reports less physical activity has been struggling with a long recovery after undergoing circumcision for issues with phimosis.  Its unfortunate that he had a difficult recovery but seems to be doing better.  Blood pressure was well controlled today.  His EKG demonstrated some worsening inferior and lateral T wave inversions.  This was previously seen however not as significant.  His  last heart cath was in 2017 which did show a patent SVG to OM and occluded SVG to right coronary.  He is anticipating restarting an exercise program and working with a Psychologist, educational.  02/24/2021  Jeremiah [name] is seen today in follow-up.  Overall he says he is feeling reasonably well.  He has had some shortness of breath.  He is scheduled for a sleep study.  We discussed this because of the previously mentioned issues and that is actually scheduled tonight.  He also has a history of heart failure with diabetes which is not at target A1c of 8.3.  Lipids recently at assess her total cholesterol 106, HDL 33, LDL 49 and triglycerides 134.  07/18/2021  Jeremiah [name] is seen today for routine follow-up.  He seems to be doing well on the Jardiance at 25 mg daily.  Hemoglobin A1c is now come down to 7.3.  Blood pressure is well controlled.  EKG is unchanged showing sinus rhythm with some lateral T wave changes.  He has noted some lower blood pressures at home.  He says around the mid 90s over 16s.  Blood pressure here was elevated 130/90 however more typical readings are between 90 and 100 systolic.  He says he is asymptomatic for this.  He is on very little medicine.  1 option would be to consider backing off on the bisoprolol.  Cholesterol is very well controlled as of February total 106, HDL 33, LDL 49 triglycerides 134.  I do not believe it is "too low".  Would recommend continue his current regimen.  02/02/2022  Jeremiah [name] is seen today in follow-up.  Overall he says he is doing very well.  Weight has been fairly stable.  Blood pressure is well controlled.  He has been undergoing some rehab for a frozen shoulder.  He denies any worsening shortness of breath.  Recent labs showed total cholesterol 153, HDL 33 triglycerides 291 and LDL 73.  A1c 7.3%.  EKG shows a sinus rhythm with some inferolateral T wave changes (unchanged from prior EKG in 2022).  09/07/2022  Jeremiah [name] is seen today in follow-up.  Overall he seems to be  doing pretty well.  He is actually lost quite a bit of weight recently.  He is down to 215 pounds but was as high as 245 back in March of this past year.  He has made some dietary changes.  He is also had some issues with his GI system and is having an upcoming endoscopy.  Blood pressure is well controlled.  He denies any chest pain.  He did have a recent syncopal episode.  He said he was working on his house and  had been not sleeping well and thought he was dehydrated and simply passed out.  It does not sound like there was a clear prodrome prior to that but if he feels it was likely related to exhaustion.  He has never passed out before.  He has not had any subsequent episodes.  12/27/2022  Jeremiah [name] returns today for follow-up.  Recently he is followed up with Dr. Sherene Sires.  Has been having issues with reflux and had EGD and colonoscopy.  He was noted to have I believe 24 lesions of the esophagus, likely Barrett's esophagus.  He has had vomiting and is scheduled for a gastric emptying study.  Despite this, he is also made significant dietary changes and reports in the past 3 months he is lost about 35 pounds.  It was noted that he is markedly hypotensive today with blood pressure 82/46.  He was also seen recently as mentioned with Dr. Sherene Sires and had a CT scan of the chest.  Besides emphysema this demonstrated aortic valve calcification as well as pulmonary artery dilatation suggestive of pulmonary hypertension.  He has been short of breath but uses home oxygen.  He also tells me that he has an upcoming visit for periodontal surgery with Dr. Jeanice Lim.  02/18/2023  Jeremiah [name] is seen today as an add-on visit.  Unfortunately he was just hospitalized over the past week with dehydration, nausea vomiting and diarrhea as well as abdominal pain.  He was seen in [facility] ER and stabilized and then presented to his PCP was sent to [facility].  There he was admitted and found to have norovirus gastroenteritis in addition to what was described  as an ischemic colitis.  His medications were held including his diuretics and blood pressure medicines to allow higher blood pressure.  I had recently cut back on his blood pressure medicine in January, stopping his losartan.  Since discharge she has had some persistent diarrhea.  He was noted to be somewhat anemic with a hemoglobin of 11.4.  His echo was read by myself and surprisingly indicated that he has moderate to severe mitral stenosis with elevated left atrial pressure.  This is likely contributing to some of his symptoms of shortness of breath.  Today he says he feels quite fatigued and is very symptomatic.  He is lost a significant amount of weight down another 15 pounds since I saw him in January.  In addition to the mitral stenosis, his EKG today shows atrial fibrillation.  This is a new diagnosis for him.  It does not appear that he had an EKG at [facility] that I could find but did have an EKG at [facility] which showed sinus rhythm with marked sinus arrhythmia although I would say it is questionably A-fib.  03/28/2023  Jeremiah [name] returns today for follow-up.  Unfortunately he has been hospitalized at least twice since I last saw him at [facility].  He has had issues with recurrent diarrhea and then unfortunately GI bleeding.  He was found to have mesenteric ischemia and underwent left brachial artery cutdown and mesenteric artery and celiac artery stenting.  Fortunately, he was noted to be in sinus rhythm when he presented although we had been working him up for possible TEE cardioversion.  Ultimately cardiology was consulted and they recommended a TEE to further evaluate his mitral valve disease.  Eliquis was held but after stenting ultimately aspirin and Eliquis were restarted.  He is TEE demonstrated normal LVEF with heavily calcified mitral valve that was thought to be degenerative with  mild MR and a mean gradient of 5.8 at 65 bpm consistent with severe mitral stenosis with possibly low gradient.  There was  also diffuse calcification of the aortic valve which planimeter to 1.2 cm with a peak velocity of 3.2 m/s thought to be moderately stenotic.  He required 8 units of packed red blood cells and after discharge remained anemic with a hemoglobin of 7.8.  He remains on oxygen and was recently started on iron.  We discussed today about possible management options.  I do feel that mitral stenosis is likely a major risk factor for worsening shortness of breath as well as development of recurrent atrial fibrillation and stroke.  They had discussed the possibility of a Watchman device with him however with his peripheral arterial disease and mitral stenosis I think this would be a suboptimal procedure.  Ideally 1 would consider mitral valve replacement surgery with left atrial appendage closure surgically, and possibly consider aortic valve intervention.  However his comorbidities, recent GI bleeding and mesenteric ischemia may make him a prohibitive surgical candidate.  Additionally he has had prior CABG and this would be a redo sternotomy.  06/26/2023  Jeremiah [name] returns today for follow-up.  Again he was hospitalized at this time at [facility] for severe anemia and acute on chronic heart failure.  I did see him as I was on service in the hospital.  We participated in a diuresis.  Workup revealed severe anemia.  He was given IV iron.  His blood thinners have been stopped.  Subsequently after discharge and substantial diuresis of 15 pounds, he was discharged on 80 mg Lasix daily and Aldactone.  Since follow-up his weight has remained stable or is actually a few pounds lighter.  He recently saw Dr. Sherene Sires with pulmonary who made an adjustment changing his metoprolol over to bisoprolol.  He has noted no change in his symptoms with this.  He was also seen at [facility] for capsule endoscopy.  This suggested that there was some small bowel AVM however options to try to alleviate bleeding from this are limited.  They had suggested  possibly a referral to [facility] to see about a "balloon procedure".  Today he is in good spirits.  He reports his symptoms are pretty stable.  He has had some improvement in nasal allergy symptoms with Flonase and is requesting a refill for that.  He is scheduled to see hematology tomorrow and my partners in October and November to address the Watchman device and possible mitral valve intervention.  11/11/2023  Jeremiah [name] is seen today in follow-up.  Unfortunately last few months have been quite difficult for him.  He had redeveloped GI bleeding and became quite anemic with hemoglobin down into the sixes.  He was again transfused.  He has been established with oncology and is undergoing therapies there to help improve his anemia.  He apparently was found to have bleeding from a small branch of the SMA based on capsule endoscopy.  This is not something that can be apparently easily treated however there is some ability to treat it but requires apparently specialized equipment that could be only at [place] or in [place].  Because of ongoing bleeding risk, he has not been a candidate for anticoagulation.  He has been having significant pain in both legs particularly when walking.  Dopplers in October 2024 shows severe stenosis of the right superficial femoral artery and severe stenosis of the above-the-knee popliteal artery on the left.  He is frustrated that this continues  however I discussed with Dr. Allyson Sabal today and percutaneous intervention for this is not possible to tolerate dual antiplatelet therapy for about 3 months.  Currently he is on aspirin low-dose however would need to be on Plavix as well.  Of note his blood pressure is quite low today at 80/49.  I wonder if this may be related to recent bleeding and or dehydration.  PMHx:  Past Medical History:  Diagnosis Date   A-fib (HCC)    Acute on chronic systolic (congestive) heart failure (HCC) 06/10/2017   Acute pulmonary edema (HCC)    Anemia    Aortic valve  sclerosis 12/2018   Noted on ECHO   Arthritis    CAD (coronary artery disease)    a. 2006: CABG in 2006 with LIMA-LAD, SVG-OM1, and SVG-RPDA   Cardiomegaly 12/2018   Stable, noted on CXR   Carpal tunnel syndrome    Right   Chronic pain 03/21/2016   Colon cancer (HCC) 2006   COPD (chronic obstructive pulmonary disease) (HCC)    Diabetes mellitus without complication (HCC)    DVT (deep venous thrombosis) (HCC)    Right   Essential hypertension 03/21/2016   GERD (gastroesophageal reflux disease)    History of blood transfusion    History of Clostridioides difficile infection    History of prostate cancer 03/21/2016   History of PSVT (paroxysmal supraventricular tachycardia)    HLD (hyperlipidemia)    HTN (hypertension)    Hx of CABG 2006   Hypercholesteremia 03/21/2016   Hypothyroidism    LAE (left atrial enlargement) 12/2018   Severe, Noted on ECHO   LVH (left ventricular hypertrophy) 12/2018   Mild, Noted on ECHO   Medication management 03/21/2016   Mitral annular calcification 12/2018   with mild MS, Noted on ECHO   Morbid obesity (HCC) 03/21/2016   Myocardial infarct (HCC)    OSA (obstructive sleep apnea) 03/21/2016   uses oxygen at night time   Pain in right ankle and joints of right foot 03/21/2016   Paronychia 03/21/2016   Phimosis    Pneumonia    Primary insomnia 03/21/2016   Prostate cancer (HCC) 2008   Pulmonary hypertension (HCC) 12/2018   Moderate, Noted on ECHO   Tobacco dependence 03/21/2016   Tricuspid regurgitation 12/2018   Mild, Noted on ECHO    Past Surgical History:  Procedure Laterality Date   ANKLE SURGERY Right 12/2013   CARDIAC CATHETERIZATION N/A 05/08/2016   Procedure: Left Heart Cath and Cors/Grafts Angiography;  Surgeon: Lyn Records, MD;  Location: [facility] INVASIVE CV LAB;  Service: Cardiovascular;  Laterality: N/A;   CIRCUMCISION N/A 10/07/2019   Procedure: CIRCUMCISION ADULT;  Surgeon: Crist Fat, MD;  Location: [facility] ORS;  Service:  Urology;  Laterality: N/A;   CORONARY ARTERY BYPASS GRAFT  2006   x3   ELECTROPHYSIOLOGIC STUDY N/A 08/24/2016   Procedure: SVT Ablation;  Surgeon: Will Jorja Loa, MD;  Location: [facility] INVASIVE CV LAB;  Service: Cardiovascular;  Laterality: N/A;   PACEMAKER IMPLANT N/A 08/23/2023   Procedure: PACEMAKER IMPLANT;  Surgeon: Regan Lemming, MD;  Location: [facility] INVASIVE CV LAB;  Service: Cardiovascular;  Laterality: N/A;   PROSTATE SURGERY  2008   PTCA     UMBILICAL HERNIA REPAIR N/A 05/26/2022   Procedure: PRIMARY REPAIR OF STRANGULATED UMBILICAL HERNIA WITH PARTIAL OMENTECTOMY;  Surgeon: Almond Lint, MD;  Location: [facility] OR;  Service: General;  Laterality: N/A;    FAMHx:  Family History  Problem Relation Age of Onset  Dementia Mother    Diabetes Sister     SOCHx:   reports that he quit smoking about 7 years ago. His smoking use included cigarettes. He started smoking about 67 years ago. He has a 45 pack-year smoking history. He has never used smokeless tobacco. He reports that he does not currently use alcohol. He reports that he does not use drugs.  ALLERGIES:  Allergies  Allergen Reactions   Shellfish Allergy Shortness Of Breath, Nausea And Vomiting and Rash    Scallops     Fluzone [Influenza Virus Vaccine] Nausea And Vomiting, Palpitations and Rash    ROS: Pertinent items noted in HPI and remainder of comprehensive ROS otherwise negative.  HOME MEDS: Current Outpatient Medications  Medication Sig Dispense Refill   acetaminophen (TYLENOL) 650 MG CR tablet Take 1,300 mg by mouth every 8 (eight) hours as needed for pain.     aspirin EC 81 MG tablet Take 1 tablet (81 mg total) by mouth daily. Swallow whole. 90 tablet 3   atorvastatin (LIPITOR) 80 MG tablet Take 80 mg by mouth daily.     budesonide (PULMICORT) 0.5 MG/2ML nebulizer solution Take 2 mLs (0.5 mg total) by nebulization 2 (two) times daily. 120 mL 11   cetirizine (ZYRTEC) 10 MG tablet Take 10 mg by mouth daily.      colchicine 0.6 MG tablet Take 0.6 mg by mouth daily as needed (Gout).     dexlansoprazole (DEXILANT) 60 MG capsule Take 60 mg by mouth daily as needed.     dicyclomine (BENTYL) 10 MG capsule Take 10 mg by mouth 3 (three) times daily before meals. 30 minutes before eating     ezetimibe (ZETIA) 10 MG tablet Take 1 tablet (10 mg total) by mouth daily. 90 tablet 3   ferrous sulfate 325 (65 FE) MG EC tablet Take 325 mg by mouth daily with breakfast.     fluticasone (FLONASE) 50 MCG/ACT nasal spray SHAKE LIQUID AND USE 1 SPRAY IN EACH NOSTRIL DAILY (Patient taking differently: Place 1 spray into both nostrils daily.) 16 g 2   furosemide (LASIX) 40 MG tablet Take 2 tablets (80 mg total) by mouth daily. (Patient taking differently: Take 80 mg by mouth 2 (two) times daily.) 30 tablet 0   glimepiride (AMARYL) 2 MG tablet Take 2 mg by mouth every morning.     ketoconazole (NIZORAL) 2 % shampoo Apply 1 Application topically daily.     levothyroxine (SYNTHROID, LEVOTHROID) 88 MCG tablet Take 88 mcg by mouth daily before breakfast. Take on an empty stomach     metFORMIN (GLUCOPHAGE-XR) 500 MG 24 hr tablet Take 1,000 mg by mouth daily.     omeprazole (PRILOSEC) 40 MG capsule Take 40 mg by mouth daily.     Potassium Chloride ER 20 MEQ TBCR Take 1 tablet by mouth daily.     revefenacin (YUPELRI) 175 MCG/3ML nebulizer solution Take 175 mcg by nebulization in the morning and at bedtime.     spironolactone (ALDACTONE) 25 MG tablet Take 1 tablet (25 mg total) by mouth daily. 30 tablet 0   benzonatate (TESSALON) 200 MG capsule Take 200 mg by mouth 3 (three) times daily as needed. (Patient not taking: Reported on 02/10/2024)     guaiFENesin-codeine 100-10 MG/5ML syrup Take 5 mLs by mouth every 6 (six) hours as needed. (Patient not taking: Reported on 02/10/2024)     meclizine (ANTIVERT) 25 MG tablet Take 1 tablet (25 mg total) by mouth 3 (three) times daily as needed for  dizziness. (Patient not taking: Reported on  02/10/2024) 30 tablet 0   No current facility-administered medications for this visit.    LABS/IMAGING: No results found for this or any previous visit (from the past 48 hours).  No results found.  WEIGHTS: Wt Readings from Last 3 Encounters:  02/10/24 213 lb 12.8 oz (97 kg)  02/03/24 212 lb (96.2 kg)  01/08/24 210 lb 1.6 oz (95.3 kg)    VITALS: BP 112/66 (BP Location: Left Arm, Patient Position: Sitting, Cuff Size: Normal)   Pulse 91   Ht 5\' 8"  (1.727 m)   Wt 213 lb 12.8 oz (97 kg)   SpO2 96%   BMI 32.51 kg/m   EXAM: General appearance: alert, no distress, and pale Neck: no carotid bruit, no JVD, and thyroid not enlarged, symmetric, no tenderness/mass/nodules Lungs: diminished breath sounds bilaterally Heart: irregularly irregular rhythm, S1, S2 normal, and systolic murmur: holosystolic 3/6, blowing at 2nd right intercostal space Abdomen: Soft, mild tenderness to palpation Extremities: extremities normal, atraumatic, no cyanosis or edema Pulses: 2+ and symmetric Skin: Pale, cool, dry Neurologic: Grossly normal Psych: Pleasant  EKG: Deferred  ASSESSMENT: Paroxysmal atrial fibrillation -CHA2DS2-VASc score of 4, currently in sinus rhythm Moderate aortic stenosis and severe mitral stenosis Ischemic colitis with recent GI bleeding Recent SVT which was symptomatic-repeat cardiac catheterization (05/2016) shows patent grafts, status post ablation of AV nodal reentry tachycardia (08/2016) CAD status post three-vessel CABG in 2006 (LIMA to LAD, SVG to OM1, SVG to RPDA) in [place] Patent bypass grafts by cath in 2015 Dyslipidemia on Lipitor OSA-BiPAP recommended COPD Chronic systolic congestive heart failure-LVEF 45-50% -improved to 60 to 65% (12/2018)  Phimosis Syncope  PLAN: 1.   Jeremiah [name] has been experiencing claudication but says that he has been doing exercise therapy walking up and down stairs up to 70 times a day.  He has to stop of course to rest because of  pain.  He does have high-grade lesions on the right SFA and left popliteal arteries.  Unfortunately he is not a candidate for intervention at this time because he cannot be anticoagulated.  He will need to continue to work to try to see if he can find the source of GI bleeding and have that addressed.  Ultimately if he has a stable hemoglobin without recurrent issues then would consider reevaluation for peripheral intervention and he would need to be on dual antiplatelet therapy with aspirin and Plavix for at least a month.  Will plan close follow-up with him in about 3 months.  Rx was hypotension today, advised him to stop his spironolactone and hold the Lasix for several days.  Will plan to recheck with him on Thursday to see if his blood pressure is improved somewhat.  Chrystie Nose, MD, [facility], FACP  [place]  [facility]  Medical Director of the [facility] Lipid [facility] &  Cardiovascular Risk Reduction Clinic Diplomate of the American Board of Clinical Lipidology Attending Cardiologist  Direct Dial: [phone]  Fax: [phone]  Website:  [email or web address] Nicely Yunior Jain 02/10/2024, 2:30 PM

## 2024-02-11 ENCOUNTER — Telehealth (INDEPENDENT_AMBULATORY_CARE_PROVIDER_SITE_OTHER): Payer: Self-pay | Admitting: Otolaryngology

## 2024-02-11 NOTE — Telephone Encounter (Signed)
 Confirmed appt & location [id number]

## 2024-02-12 ENCOUNTER — Encounter (INDEPENDENT_AMBULATORY_CARE_PROVIDER_SITE_OTHER): Payer: Self-pay

## 2024-02-12 ENCOUNTER — Ambulatory Visit (INDEPENDENT_AMBULATORY_CARE_PROVIDER_SITE_OTHER): Payer: Federal, State, Local not specified - PPO

## 2024-02-12 VITALS — BP 102/62 | HR 100 | Ht 68.0 in | Wt 214.0 lb

## 2024-02-12 DIAGNOSIS — H903 Sensorineural hearing loss, bilateral: Secondary | ICD-10-CM | POA: Diagnosis not present

## 2024-02-12 DIAGNOSIS — R42 Dizziness and giddiness: Secondary | ICD-10-CM

## 2024-02-13 DIAGNOSIS — R42 Dizziness and giddiness: Secondary | ICD-10-CM | POA: Insufficient documentation

## 2024-02-13 DIAGNOSIS — H903 Sensorineural hearing loss, bilateral: Secondary | ICD-10-CM | POA: Insufficient documentation

## 2024-02-13 NOTE — Progress Notes (Signed)
 Patient ID: Jeremiah [name], male   DOB: 1946/06/03, 78 y.o.   MRN: [id number]  CC: Chronic dizziness  HPI:  Jeremiah [name] is a 78 y.o. male who presents today for evaluation of his chronic dizziness.  He has been symptomatic for more than 2 years.  The patient has a complex medical history, including a history of coronary artery disease, A-fib, aortic valve stenosis, status post coronary artery bypass graft and pacemaker placement.  He also has a history of severe anemia, secondary to chronic GI bleeds.  The patient is currently awaiting his heart valve surgery.  The surgery was postponed due to his severe anemia.  The patient describes his dizziness as a spinning vertigo that lasts for minutes.  In between episodes, he also complains of lightheaded and off-balance sensation.  He was treated with meclizine.  He has no previous ENT surgery.  Currently he denies any otalgia or otorrhea.  He has no recent otitis media or otitis externa.  He has a history of bilateral hearing loss, and is currently awaiting hearing aid fitting.  Past Medical History:  Diagnosis Date   A-fib [facility])    Acute on chronic systolic (congestive) heart failure (HCC) 06/10/2017   Acute pulmonary edema (HCC)    Anemia    Aortic valve sclerosis 12/2018   Noted on ECHO   Arthritis    CAD (coronary artery disease)    a. 2006: CABG in 2006 with LIMA-LAD, SVG-OM1, and SVG-RPDA   Cardiomegaly 12/2018   Stable, noted on CXR   Carpal tunnel syndrome    Right   Chronic pain 03/21/2016   Colon cancer (HCC) 2006   COPD (chronic obstructive pulmonary disease) (HCC)    Diabetes mellitus without complication (HCC)    DVT (deep venous thrombosis) (HCC)    Right   Essential hypertension 03/21/2016   GERD (gastroesophageal reflux disease)    History of blood transfusion    History of Clostridioides difficile infection    History of prostate cancer 03/21/2016   History of PSVT (paroxysmal supraventricular tachycardia)    HLD  (hyperlipidemia)    HTN (hypertension)    Hx of CABG 2006   Hypercholesteremia 03/21/2016   Hypothyroidism    LAE (left atrial enlargement) 12/2018   Severe, Noted on ECHO   LVH (left ventricular hypertrophy) 12/2018   Mild, Noted on ECHO   Medication management 03/21/2016   Mitral annular calcification 12/2018   with mild MS, Noted on ECHO   Morbid obesity (HCC) 03/21/2016   Myocardial infarct (HCC)    OSA (obstructive sleep apnea) 03/21/2016   uses oxygen at night time   Pain in right ankle and joints of right foot 03/21/2016   Paronychia 03/21/2016   Phimosis    Pneumonia    Primary insomnia 03/21/2016   Prostate cancer (HCC) 2008   Pulmonary hypertension (HCC) 12/2018   Moderate, Noted on ECHO   Tobacco dependence 03/21/2016   Tricuspid regurgitation 12/2018   Mild, Noted on ECHO    Past Surgical History:  Procedure Laterality Date   ANKLE SURGERY Right 12/2013   CARDIAC CATHETERIZATION N/A 05/08/2016   Procedure: Left Heart Cath and Cors/Grafts Angiography;  Surgeon: Lyn Records, MD;  Location: [facility] INVASIVE CV LAB;  Service: Cardiovascular;  Laterality: N/A;   CIRCUMCISION N/A 10/07/2019   Procedure: CIRCUMCISION ADULT;  Surgeon: Crist Fat, MD;  Location: [facility] ORS;  Service: Urology;  Laterality: N/A;   CORONARY ARTERY BYPASS GRAFT  2006   x3   ELECTROPHYSIOLOGIC  STUDY N/A 08/24/2016   Procedure: SVT Ablation;  Surgeon: Will Jorja Loa, MD;  Location: [facility] INVASIVE CV LAB;  Service: Cardiovascular;  Laterality: N/A;   PACEMAKER IMPLANT N/A 08/23/2023   Procedure: PACEMAKER IMPLANT;  Surgeon: Regan Lemming, MD;  Location: [facility] INVASIVE CV LAB;  Service: Cardiovascular;  Laterality: N/A;   PROSTATE SURGERY  2008   PTCA     UMBILICAL HERNIA REPAIR N/A 05/26/2022   Procedure: PRIMARY REPAIR OF STRANGULATED UMBILICAL HERNIA WITH PARTIAL OMENTECTOMY;  Surgeon: Almond Lint, MD;  Location: [facility] OR;  Service: General;  Laterality: N/A;    Family History  Problem  Relation Age of Onset   Dementia Mother    Diabetes Sister     Social History:  reports that he quit smoking about 7 years ago. His smoking use included cigarettes. He started smoking about 67 years ago. He has a 45 pack-year smoking history. He has never used smokeless tobacco. He reports that he does not currently use alcohol. He reports that he does not use drugs.  Allergies:  Allergies  Allergen Reactions   Shellfish Allergy Shortness Of Breath, Nausea And Vomiting and Rash    Scallops     Fluzone [Influenza Virus Vaccine] Nausea And Vomiting, Palpitations and Rash    Prior to Admission medications   Medication Sig Start Date End Date Taking? Authorizing Provider  acetaminophen (TYLENOL) 650 MG CR tablet Take 1,300 mg by mouth every 8 (eight) hours as needed for pain.   Yes [provider]  aspirin EC 81 MG tablet Take 1 tablet (81 mg total) by mouth daily. Swallow whole. 04/17/23  Yes Orbie Pyo, MD  atorvastatin (LIPITOR) 80 MG tablet Take 80 mg by mouth daily.   Yes [provider]  benzonatate (TESSALON) 200 MG capsule Take 200 mg by mouth 3 (three) times daily as needed. 11/13/23  Yes [provider]  budesonide (PULMICORT) 0.5 MG/2ML nebulizer solution Take 2 mLs (0.5 mg total) by nebulization 2 (two) times daily. 05/13/23  Yes Martina Sinner, MD  cetirizine (ZYRTEC) 10 MG tablet Take 10 mg by mouth daily.   Yes [provider]  colchicine 0.6 MG tablet Take 0.6 mg by mouth daily as needed (Gout).   Yes [provider]  dexlansoprazole (DEXILANT) 60 MG capsule Take 60 mg by mouth daily as needed.   Yes [provider]  dicyclomine (BENTYL) 10 MG capsule Take 10 mg by mouth 3 (three) times daily before meals. 30 minutes before eating 02/19/23  Yes [provider]  ezetimibe (ZETIA) 10 MG tablet Take 1 tablet (10 mg total) by mouth daily. 12/03/23 03/02/24 Yes Hilty, Lisette Abu, MD  ferrous sulfate 325 (65 FE) MG  EC tablet Take 325 mg by mouth daily with breakfast.   Yes [provider]  fluticasone (FLONASE) 50 MCG/ACT nasal spray SHAKE LIQUID AND USE 1 SPRAY IN EACH NOSTRIL DAILY Patient taking differently: Place 1 spray into both nostrils daily. 10/30/23  Yes Hilty, Lisette Abu, MD  furosemide (LASIX) 40 MG tablet Take 2 tablets (80 mg total) by mouth daily. Patient taking differently: Take 80 mg by mouth 2 (two) times daily. 06/11/23  Yes Zannie Cove, MD  glimepiride (AMARYL) 2 MG tablet Take 2 mg by mouth every morning. 08/28/23  Yes [provider]  guaiFENesin-codeine 100-10 MG/5ML syrup Take 5 mLs by mouth every 6 (six) hours as needed. 11/10/23  Yes [provider]  ketoconazole (NIZORAL) 2 % shampoo Apply 1 Application topically  daily. 07/11/23  Yes [provider]  levothyroxine (SYNTHROID, LEVOTHROID) 88 MCG tablet Take 88 mcg by mouth daily before breakfast. Take on an empty stomach   Yes [provider]  meclizine (ANTIVERT) 25 MG tablet Take 1 tablet (25 mg total) by mouth 3 (three) times daily as needed for dizziness. 12/19/23  Yes Cardama, Amadeo Garnet, MD  metFORMIN (GLUCOPHAGE-XR) 500 MG 24 hr tablet Take 1,000 mg by mouth daily. 11/28/23  Yes [provider]  omeprazole (PRILOSEC) 40 MG capsule Take 40 mg by mouth daily.   Yes [provider]  Potassium Chloride ER 20 MEQ TBCR Take 1 tablet by mouth daily. 07/11/23  Yes [provider]  revefenacin (YUPELRI) 175 MCG/3ML nebulizer solution Take 175 mcg by nebulization in the morning and at bedtime.   Yes [provider]  spironolactone (ALDACTONE) 25 MG tablet Take 1 tablet (25 mg total) by mouth daily. 06/12/23  Yes Zannie Cove, MD    Blood pressure 102/62, pulse 100, height 5\' 8"  (1.727 m), weight 214 lb (97.1 kg), SpO2 91%. Exam: General: Communicates without difficulty, well nourished, no acute distress. Head: Normocephalic, no evidence injury, no  tenderness, facial buttresses intact without stepoff. Face/sinus: No tenderness to palpation and percussion. Facial movement is normal and symmetric. Eyes: PERRL, EOMI. No scleral icterus, conjunctivae clear. Neuro: CN II exam reveals vision grossly intact.  No nystagmus at any point of gaze. Ears: Auricles well formed without lesions.  Ear canals are intact without mass or lesion.  No erythema or edema is appreciated.  The TMs are intact without fluid. Nose: External evaluation reveals normal support and skin without lesions.  Dorsum is intact.  Anterior rhinoscopy reveals congested mucosa over anterior aspect of inferior turbinates and intact septum.  No purulence noted. Oral:  Oral cavity and oropharynx are intact, symmetric, without erythema or edema.  Mucosa is moist without lesions. Neck: Full range of motion without pain.  There is no significant lymphadenopathy.  No masses palpable.  Thyroid bed within normal limits to palpation.  Parotid glands and submandibular glands equal bilaterally without mass.  Trachea is midline. Neuro:  CN 2-12 grossly intact. Vestibular: No nystagmus at any point of gaze. Dix Hallpike negative. Vestibular: There is no nystagmus with pneumatic pressure on either tympanic membrane or Valsalva. The cerebellar examination is unremarkable.    Assessment: 1.  Recurrent dizziness of unknown etiology.  The patient has multiple risk factors that could contribute to his chronic dizziness, including his multiple cardiovascular disorders and chronic anemia.  Other possible differential diagnoses include transient BPPV, vestibular migraine, Meniere's disease, peripheral vestibular dysfunction, or other central/systemic causes.   2.  His ear canals, tympanic membranes, and middle ear spaces are normal.  His Dix-Hallpike maneuver is negative. 3.  History of bilateral sensorineural hearing loss.  Plan: 1.  The physical exam findings are reviewed with the patient. 2.  The pathophysiology  of vestibular dysfunction and dizziness are discussed extensively with the patient. The possible differential diagnoses are reviewed. Questions are invited and answered.   3.  The patient will likely benefit from undergoing physical therapy/vestibular rehabilitation to improve his balancing function. A referral will be arranged as soon as possible.  4.  If the patient continues to be symptomatic, he may benefit from vestibular neurodiagnostic testing at a tertiary care center to evaluate for possible vestibular dysfunction.   Sean Malinowski W Jeana Kersting 02/13/2024, 11:28 AM

## 2024-02-18 ENCOUNTER — Inpatient Hospital Stay: Payer: Federal, State, Local not specified - PPO

## 2024-02-18 DIAGNOSIS — D500 Iron deficiency anemia secondary to blood loss (chronic): Secondary | ICD-10-CM | POA: Diagnosis not present

## 2024-02-18 DIAGNOSIS — D649 Anemia, unspecified: Secondary | ICD-10-CM

## 2024-02-18 LAB — CBC WITH DIFFERENTIAL (CANCER CENTER ONLY)
Abs Immature Granulocytes: 0.02 10*3/uL (ref 0.00–0.07)
Basophils Absolute: 0 10*3/uL (ref 0.0–0.1)
Basophils Relative: 1 %
Eosinophils Absolute: 0.2 10*3/uL (ref 0.0–0.5)
Eosinophils Relative: 4 %
HCT: 38.4 % — ABNORMAL LOW (ref 39.0–52.0)
Hemoglobin: 12 g/dL — ABNORMAL LOW (ref 13.0–17.0)
Immature Granulocytes: 0 %
Lymphocytes Relative: 28 %
Lymphs Abs: 1.4 10*3/uL (ref 0.7–4.0)
MCH: 27.7 pg (ref 26.0–34.0)
MCHC: 31.3 g/dL (ref 30.0–36.0)
MCV: 88.7 fL (ref 80.0–100.0)
Monocytes Absolute: 0.6 10*3/uL (ref 0.1–1.0)
Monocytes Relative: 11 %
Neutro Abs: 2.7 10*3/uL (ref 1.7–7.7)
Neutrophils Relative %: 56 %
Platelet Count: 193 10*3/uL (ref 150–400)
RBC: 4.33 MIL/uL (ref 4.22–5.81)
RDW: 20.1 % — ABNORMAL HIGH (ref 11.5–15.5)
WBC Count: 4.8 10*3/uL (ref 4.0–10.5)
nRBC: 0 % (ref 0.0–0.2)

## 2024-02-18 LAB — SAMPLE TO BLOOD BANK

## 2024-02-19 ENCOUNTER — Other Ambulatory Visit (HOSPITAL_BASED_OUTPATIENT_CLINIC_OR_DEPARTMENT_OTHER): Payer: Self-pay

## 2024-02-24 ENCOUNTER — Ambulatory Visit (INDEPENDENT_AMBULATORY_CARE_PROVIDER_SITE_OTHER): Payer: Federal, State, Local not specified - PPO

## 2024-02-24 DIAGNOSIS — I442 Atrioventricular block, complete: Secondary | ICD-10-CM

## 2024-02-24 LAB — CUP PACEART REMOTE DEVICE CHECK
Battery Remaining Longevity: 124 mo
Battery Remaining Percentage: 95.5 %
Battery Voltage: 3.01 V
Brady Statistic AP VP Percent: 3.1 %
Brady Statistic AP VS Percent: 1.1 %
Brady Statistic AS VP Percent: 29 %
Brady Statistic AS VS Percent: 66 %
Brady Statistic RA Percent Paced: 3.6 %
Brady Statistic RV Percent Paced: 32 %
Date Time Interrogation Session: 20250324020015
Implantable Lead Connection Status: 753985
Implantable Lead Connection Status: 753985
Implantable Lead Implant Date: 20240920
Implantable Lead Implant Date: 20240920
Implantable Lead Location: 753859
Implantable Lead Location: 753860
Implantable Pulse Generator Implant Date: 20240920
Lead Channel Impedance Value: 480 Ohm
Lead Channel Impedance Value: 490 Ohm
Lead Channel Pacing Threshold Amplitude: 0.5 V
Lead Channel Pacing Threshold Amplitude: 0.625 V
Lead Channel Pacing Threshold Pulse Width: 0.5 ms
Lead Channel Pacing Threshold Pulse Width: 0.5 ms
Lead Channel Sensing Intrinsic Amplitude: 12 mV
Lead Channel Sensing Intrinsic Amplitude: 5 mV
Lead Channel Setting Pacing Amplitude: 0.875
Lead Channel Setting Pacing Amplitude: 1.5 V
Lead Channel Setting Pacing Pulse Width: 0.5 ms
Lead Channel Setting Sensing Sensitivity: 2 mV
Pulse Gen Model: 2272
Pulse Gen Serial Number: 8213521

## 2024-03-02 ENCOUNTER — Inpatient Hospital Stay

## 2024-03-02 DIAGNOSIS — D649 Anemia, unspecified: Secondary | ICD-10-CM

## 2024-03-02 DIAGNOSIS — D539 Nutritional anemia, unspecified: Secondary | ICD-10-CM

## 2024-03-02 DIAGNOSIS — D500 Iron deficiency anemia secondary to blood loss (chronic): Secondary | ICD-10-CM | POA: Diagnosis not present

## 2024-03-02 LAB — CBC WITH DIFFERENTIAL (CANCER CENTER ONLY)
Abs Immature Granulocytes: 0.02 10*3/uL (ref 0.00–0.07)
Basophils Absolute: 0 10*3/uL (ref 0.0–0.1)
Basophils Relative: 0 %
Eosinophils Absolute: 0.1 10*3/uL (ref 0.0–0.5)
Eosinophils Relative: 3 %
HCT: 37.3 % — ABNORMAL LOW (ref 39.0–52.0)
Hemoglobin: 11.7 g/dL — ABNORMAL LOW (ref 13.0–17.0)
Immature Granulocytes: 0 %
Lymphocytes Relative: 19 %
Lymphs Abs: 1 10*3/uL (ref 0.7–4.0)
MCH: 28.3 pg (ref 26.0–34.0)
MCHC: 31.4 g/dL (ref 30.0–36.0)
MCV: 90.3 fL (ref 80.0–100.0)
Monocytes Absolute: 0.5 10*3/uL (ref 0.1–1.0)
Monocytes Relative: 10 %
Neutro Abs: 3.5 10*3/uL (ref 1.7–7.7)
Neutrophils Relative %: 68 %
Platelet Count: 187 10*3/uL (ref 150–400)
RBC: 4.13 MIL/uL — ABNORMAL LOW (ref 4.22–5.81)
RDW: 19.9 % — ABNORMAL HIGH (ref 11.5–15.5)
WBC Count: 5.2 10*3/uL (ref 4.0–10.5)
nRBC: 0 % (ref 0.0–0.2)

## 2024-03-02 LAB — FOLATE: Folate: 15.8 ng/mL (ref >5.9)

## 2024-03-02 LAB — IRON AND IRON BINDING CAPACITY (CC-WL,HP ONLY)
Iron: 67 ug/dL (ref 45–182)
Saturation Ratios: 14 % — ABNORMAL LOW (ref 17.9–39.5)
TIBC: 472 ug/dL — ABNORMAL HIGH (ref 250–450)
UIBC: 405 ug/dL — ABNORMAL HIGH (ref 117–376)

## 2024-03-02 LAB — VITAMIN B12: Vitamin B-12: 1412 pg/mL — ABNORMAL HIGH (ref 180–914)

## 2024-03-02 LAB — SAMPLE TO BLOOD BANK

## 2024-03-02 LAB — FERRITIN: Ferritin: 17 ng/mL — ABNORMAL LOW (ref 24–336)

## 2024-03-03 ENCOUNTER — Other Ambulatory Visit (INDEPENDENT_AMBULATORY_CARE_PROVIDER_SITE_OTHER): Payer: Self-pay | Admitting: Otolaryngology

## 2024-03-03 ENCOUNTER — Inpatient Hospital Stay: Payer: Federal, State, Local not specified - PPO

## 2024-03-03 ENCOUNTER — Inpatient Hospital Stay

## 2024-03-03 DIAGNOSIS — R42 Dizziness and giddiness: Secondary | ICD-10-CM

## 2024-03-04 NOTE — Therapy (Signed)
 OUTPATIENT PHYSICAL THERAPY VESTIBULAR EVALUATION    Patient Name: Jeremiah [name] MRN: [id number] DOB:[birth year], 78 y.o., male Today's Date: 03/06/2024  END OF SESSION:  PT End of Session - 03/06/24 1102     Visit Number 1    Number of Visits 5    Date for PT Re-Evaluation 04/05/24    Authorization Type BL[employer]    PT Start Time 1100    PT Stop Time 1142    PT Time Calculation (min) 42 min    Equipment Utilized During Treatment Oxygen   2L ambulating into clinic and at one point during standing   Activity Tolerance Patient tolerated treatment well    Behavior During Therapy WFL for tasks assessed/performed             Past Medical History:  Diagnosis Date   A-fib (HCC)    Acute on chronic systolic (congestive) heart failure (HCC) 06/10/2017   Acute pulmonary edema (HCC)    Anemia    Aortic valve sclerosis 12/2018   Noted on ECHO   Arthritis    CAD (coronary artery disease)    a. 2006: CABG in 2006 with LIMA-LAD, SVG-OM1, and SVG-RPDA   Cardiomegaly 12/2018   Stable, noted on CXR   Carpal tunnel syndrome    Right   Chronic pain 03/21/2016   Colon cancer (HCC) 2006   COPD (chronic obstructive pulmonary disease) (HCC)    Diabetes mellitus without complication (HCC)    DVT (deep venous thrombosis) (HCC)    Right   Essential hypertension 03/21/2016   GERD (gastroesophageal reflux disease)    History of blood transfusion    History of Clostridioides difficile infection    History of prostate cancer 03/21/2016   History of PSVT (paroxysmal supraventricular tachycardia)    HLD (hyperlipidemia)    HTN (hypertension)    Hx of CABG 2006   Hypercholesteremia 03/21/2016   Hypothyroidism    LAE (left atrial enlargement) 12/2018   Severe, Noted on ECHO   LVH (left ventricular hypertrophy) 12/2018   Mild, Noted on ECHO   Medication management 03/21/2016   Mitral annular calcification 12/2018   with mild MS, Noted on ECHO   Morbid obesity (HCC) 03/21/2016    Myocardial infarct (HCC)    OSA (obstructive sleep apnea) 03/21/2016   uses oxygen at night time   Pain in right ankle and joints of right foot 03/21/2016   Paronychia 03/21/2016   Phimosis    Pneumonia    Primary insomnia 03/21/2016   Prostate cancer (HCC) 2008   Pulmonary hypertension (HCC) 12/2018   Moderate, Noted on ECHO   Tobacco dependence 03/21/2016   Tricuspid regurgitation 12/2018   Mild, Noted on ECHO   Past Surgical History:  Procedure Laterality Date   ANKLE SURGERY Right 12/2013   CARDIAC CATHETERIZATION N/A 05/08/2016   Procedure: Left Heart Cath and Cors/Grafts Angiography;  Surgeon: Lyn Records, MD;  Location[facility] INVASIVE CV LAB;  Service: Cardiovascular;  Laterality: N/A;   CIRCUMCISION N/A 10/07/2019   Procedure: CIRCUMCISION ADULT;  Surgeon: Crist Fat, MD;  Location: [facility] ORS;  Service: Urology;  Laterality: N/A;   CORONARY ARTERY BYPASS GRAFT  2006   x3   ELECTROPHYSIOLOGIC STUDY N/A 08/24/2016   Procedure: SVT Ablation;  Surgeon: Will Jorja Loa, MD;  Location: [facility] INVASIVE CV LAB;  Service: Cardiovascular;  Laterality: N/A;   PACEMAKER IMPLANT N/A 08/23/2023   Procedure: PACEMAKER IMPLANT;  Surgeon: Regan Lemming, MD;  Location: [facility] INVASIVE CV  LAB;  Service: Cardiovascular;  Laterality: N/A;   PROSTATE SURGERY  2008   PTCA     UMBILICAL HERNIA REPAIR N/A 05/26/2022   Procedure: PRIMARY REPAIR OF STRANGULATED UMBILICAL HERNIA WITH PARTIAL OMENTECTOMY;  Surgeon: Almond Lint, MD;  Location: [facility] OR;  Service: General;  Laterality: N/A;   Patient Active Problem List   Diagnosis Date Noted   Dizziness 02/13/2024   Sensorineural hearing loss, bilateral 02/13/2024   Iron deficiency anemia, unspecified 10/07/2023   S/P placement of cardiac pacemaker 08/24/2023   Complete heart block (HCC) 08/22/2023   Aortic stenosis 08/22/2023   Heart block 08/22/2023   Cardiogenic shock (HCC) 08/22/2023   Peripheral arterial disease (HCC) 08/13/2023    Chronic diastolic (congestive) heart failure (HCC) 06/05/2023   Acute on chronic respiratory failure with hypoxia (HCC) 06/05/2023   Acute on chronic diastolic CHF (congestive heart failure) (HCC) 06/05/2023   Ischemic colitis (HCC) 02/18/2023   Pulmonary hypertension, unspecified (HCC) 02/18/2023   New onset atrial fibrillation (HCC) 02/18/2023   Mitral stenosis 02/18/2023   Iron deficiency anemia 10/07/2022   Former smoker 08/21/2022   Incarcerated umbilical hernia 05/26/2022   History of supraventricular tachycardia 05/26/2022   Type 2 diabetes mellitus with hyperglycemia, without long-term current use of insulin (HCC) 04/29/2020   Carotid artery calcification 09/09/2018   Coronary artery disease involving coronary bypass graft of native heart without angina pectoris 07/22/2017   COPD GOLD II with reversibility 04/03/2017   Chronic respiratory failure with hypoxia and hypercapnia (HCC) 04/03/2017   Hyperkalemia 02/26/2017   Prolonged QT interval 02/26/2017   Skin abscess 02/13/2017   Exertional shortness of breath 02/12/2017   Enteritis due to Clostridium difficile 01/05/2017   Acute GI bleeding 01/03/2017   Symptomatic anemia 01/03/2017   Hypothyroidism, acquired 01/03/2017   Cough    Diarrhea    AVNRT (AV nodal re-entry tachycardia) (HCC) 08/24/2016   NSTEMI (non-ST elevated myocardial infarction) (HCC) 05/05/2016   SVT (supraventricular tachycardia) (HCC) 03/21/2016   S/P CABG x 3 03/21/2016   Morbid obesity due to excess calories (HCC) complicated by hbp/hyperlipidemia  03/21/2016   Essential hypertension 03/21/2016   Hyperlipidemia LDL goal <70 03/21/2016   Paronychia 03/21/2016   Tobacco dependence 03/21/2016   Pain in right ankle and joints of right foot 03/21/2016   Chronic pain 03/21/2016   OSA (obstructive sleep apnea) 03/21/2016   Medication management 03/21/2016   History of prostate cancer 03/21/2016   Primary insomnia 03/21/2016   Post-traumatic  osteoarthritis of right ankle 11/07/2015   Closed displaced fracture of medial malleolus of right tibia 10/24/2015    PCP: [name], MD  REFERRING PROVIDER: Newman Pies, MD   REFERRING DIAG: R42 (ICD-10-CM) - Dizziness   THERAPY DIAG:  Dizziness and giddiness  Unsteadiness on feet  History of falling  ONSET DATE: 03/03/2024  Rationale for Evaluation and Treatment: Rehabilitation  SUBJECTIVE:   SUBJECTIVE STATEMENT: Pt ambulates in wearing 2L of O2 - just wears it when having to walk longer distances. Awaiting go get vascular surgery on his legs, has been postponed due to anemia and GI bleeds. Notes he describes his dizziness and he is on rough seas, when he walks now feels like he is walking like a penguin. When he gets up, has to do it nice and slow. Notes this started a few months ago. Had an iron infusion and had a reaction and was diagnosed with vertigo when he went to the ER. This was in January. Has had a couple falls, has  happened twice when he was walking. Another time was bending over to grab the laundry. Did not hurt himself with these falls. Used to be very mobile, now moves very slowly and has to think about what he has to do. When has the dizziness, has to sit and have his oxygen.   Pt accompanied by: self  PERTINENT HISTORY: PMH: coronary artery disease, A-fib, aortic valve stenosis, status post coronary artery bypass graft and pacemaker placement, hx of severe anemia, COPD, diabetes mellitus, HTN, HLD, hx of MI in 2006  Pt currently awaiting heart valve surgery   Per Dr. Suszanne Conners: The patient describes his dizziness as a spinning vertigo that lasts for minutes. In between episodes, he also complains of lightheaded and off-balance sensation.   PAIN:  Are you having pain? No  Vitals:   03/06/24 1120 03/06/24 1122 03/06/24 1125  BP: (!) 106/56 (!) 86/52 (!) 88/53  Pulse: 87 (!) 101 86  SpO2: 96%    Sitting, Standing and Standing after another few minutes  When  performing from sitting > standing, pt reporting feeling lightheaded when moving too quickly    PRECAUTIONS: Fall and ICD/Pacemaker  FALLS: Has patient fallen in last 6 months? Yes. Number of falls 2  LIVING ENVIRONMENT:  Has following equipment at home: Walker - 2 wheeled, does not use a walker, when his vertigo was really bad, needed to use it for a couple days  PLOF: Independent  PATIENT GOALS: Wants to be able to walk straight   OBJECTIVE:  Note: Objective measures were completed at Evaluation unless otherwise noted.  DIAGNOSTIC FINDINGS: 12/19/23:  CT Angio Head/Neck IMPRESSION: 1. No emergent large vessel occlusion. Widespread cerebrovascular atherosclerosis. 2. Severe stenosis of the left ICA distal cavernous segment and moderate right ICA cavernous segment stenosis. 3. Moderate stenosis of both vertebral artery V4 segments. 4. A 50% stenosis of the proximal right ICA.  COGNITION: Overall cognitive status: Within functional limits for tasks assessed   POSTURE:  rounded shoulders   TRANSFERS: Assistive device utilized: None  Sit to stand: Modified independence Stand to sit: Modified independence Pt reports lightheadedness in standing when moving too quickly   GAIT: Gait pattern: decreased stride length and wide BOS Distance walked: Clinic distances  Assistive device utilized: None Level of assistance: Modified independence Comments: Walks with R toe out due to an old injury back in 2014   VESTIBULAR ASSESSMENT:  GENERAL OBSERVATION: Ambulates in independently with 2L of O2   SYMPTOM BEHAVIOR:  Subjective history: See above.   Non-Vestibular symptoms:  N/A  Type of dizziness: Imbalance (Disequilibrium) and Lightheadedness/Faint  Frequency: Daily   Duration: Maybe about 5 minutes, will sit down and start using his oxygen   Aggravating factors: Induced by position change: sit to stand and getting up quickly , will have to use the scooter at [employer] -  unsure if it is because of a busier environment or doing more walking   Relieving factors:  Going to sleep or sitting down and putting his oxygen on   Progression of symptoms: Initially got better, then has stayed the same  OCULOMOTOR EXAM:  Ocular Alignment: normal  Ocular ROM: No Limitations  Spontaneous Nystagmus: absent  Gaze-Induced Nystagmus: absent  Smooth Pursuits: intact and jumpy at times   Saccades: intact   VESTIBULAR - OCULAR REFLEX:   Slow VOR: Normal  VOR Cancellation: Normal  Head-Impulse Test: Pt unable to relax neck, so unable to assess     M-CTSIB  Condition 1: Firm Surface, EO  30 Sec, Normal Sway  Condition 2: Firm Surface, EC 12.4 Sec  Condition 3: Foam Surface, EO 30 Sec, Mild Sway  Condition 4: Foam Surface, EC 7.8 Sec                                                                                                                              TREATMENT DATE:  N/A during eval   PATIENT EDUCATION: Education details: Clinical findings, POC, education on 3 balance systems and how PT will work on vestibular system for balance, also discussed that multiple cardiovascular disorders and anemia can be playing a part in regards to pt's dizziness and lightheadedness as pt also has orthostatics when going from sitting > standing. Educated on making sure that pt is transitioning more slowly especially with sit > stands before moving and having pt perform seated marching or ankle pumps before standing. Due to pt's medical hx, pt unable to have medications to help with this and can't wear compression socks  Person educated: Patient Education method: Explanation Education comprehension: verbalized understanding  HOME EXERCISE PROGRAM: Will provide at future session   GOALS: Goals reviewed with patient? Yes  SHORT TERM GOALS: ALL STGS = LTGS  LONG TERM GOALS: Target date: 04/03/2024  Pt will be independent with final HEP for balance in order to build upon functional  gains made in therapy. Baseline:  Goal status: INITIAL  2.  FGA to be assessed with LTG written.  Baseline:  Goal status: INITIAL  3.  Pt will improve condition 2 of mCTSIB to at least 30 seconds to demo improved balance with EC. Baseline: 12.4 Sec Goal status: INITIAL  4.  Pt will improve condition 4 of mCTSIB to at least 20 seconds to demo improved vestibular input for balance.  Baseline: 7.8 Sec Goal status: INITIAL    ASSESSMENT:  CLINICAL IMPRESSION: Patient is a 78 year old male referred to Neuro OPPT for Dizziness.   Pt's PMH is significant for: coronary artery disease, A-fib, aortic valve stenosis, status post coronary artery bypass graft and pacemaker placement, hx of severe anemia, COPD, diabetes mellitus, HTN, HLD, hx of MI in 2006. The following deficits were present during the exam: impaired balance, decr activity tolerance, cardiopulmonary limitations. Pt with orthostatic hypotension when going from sitting > standing with a drop in systolic BP with pt reporting feeling lightheaded in standing. Pt reports his cardiologist is aware of this, but limited to what they can address due to other co-morbidities.  Based on condition 2 and 4 of mCTSIB, pt with decr vestibular input for balance and incr reliance on vision with EO. Will perform further balance assessment at next session. Pt has multiple risk factors that could be contributing to pt's dizziness, including multiple cardiovascular disorders and chronic anemia. Pt would benefit from skilled PT to address these impairments and functional limitations to maximize functional mobility independence   OBJECTIVE IMPAIRMENTS: Abnormal gait, cardiopulmonary status limiting activity, decreased activity tolerance, decreased  balance, decreased endurance, decreased mobility, difficulty walking, dizziness, and postural dysfunction.   ACTIVITY LIMITATIONS: bending, standing, stairs, transfers, and locomotion level  PARTICIPATION  LIMITATIONS: community activity and yard work  PERSONAL FACTORS: Age, Behavior pattern, Past/current experiences, Time since onset of injury/illness/exacerbation, and 3+ comorbidities: coronary artery disease, A-fib, aortic valve stenosis, status post coronary artery bypass graft and pacemaker placement, hx of severe anemia, COPD, diabetes mellitus, HTN, HLD, hx of MI in 2006  are also affecting patient's functional outcome.   REHAB POTENTIAL: Good  CLINICAL DECISION MAKING: Evolving/moderate complexity  EVALUATION COMPLEXITY: Moderate   PLAN:  PT FREQUENCY: 1x/week  PT DURATION: 8 weeks - anticipate 4 weeks   PLANNED INTERVENTIONS: 97164- PT Re-evaluation, 97110-Therapeutic exercises, 97530- Therapeutic activity, 97112- Neuromuscular re-education, 97535- Self Care, [id number]- Manual therapy, [phone]- Gait training, [phone]- Canalith repositioning, Patient/Family education, Balance training, Vestibular training, and DME instructions  PLAN FOR NEXT SESSION: look at FGA and write goal. Initiate HEP based on FGA and EC balance on level ground and foam. CHECK BP FOR ORTHOSTATICS    Eliodoro Gullett N Anndrea Mihelich, PT, DPT 03/06/2024, 11:49 AM

## 2024-03-05 ENCOUNTER — Inpatient Hospital Stay: Attending: Physician Assistant | Admitting: Internal Medicine

## 2024-03-05 ENCOUNTER — Other Ambulatory Visit

## 2024-03-05 VITALS — BP 115/52 | HR 104 | Temp 98.3°F | Resp 17 | Ht 68.0 in | Wt 212.1 lb

## 2024-03-05 DIAGNOSIS — R42 Dizziness and giddiness: Secondary | ICD-10-CM | POA: Diagnosis not present

## 2024-03-05 DIAGNOSIS — D500 Iron deficiency anemia secondary to blood loss (chronic): Secondary | ICD-10-CM | POA: Insufficient documentation

## 2024-03-05 DIAGNOSIS — K922 Gastrointestinal hemorrhage, unspecified: Secondary | ICD-10-CM | POA: Diagnosis not present

## 2024-03-05 DIAGNOSIS — D649 Anemia, unspecified: Secondary | ICD-10-CM | POA: Diagnosis not present

## 2024-03-05 DIAGNOSIS — E1151 Type 2 diabetes mellitus with diabetic peripheral angiopathy without gangrene: Secondary | ICD-10-CM | POA: Insufficient documentation

## 2024-03-05 NOTE — Progress Notes (Signed)
 [facility] [phone]   [phone]  OFFICE PROGRESS NOTE  Koirala, Dibas, MD [address] [place] [address]  DIAGNOSIS: Iron deficiency anemia secondary to chronic gastrointestinal hemorrhage    PRIOR THERAPY:  1) Iron infusion with Ferrlecit 250 Mg IV for 4 doses. Last dose was given on 06/09/2023  2) IV iron infusion with feraheme, last dose on 10/21/23   CURRENT THERAPY: Oral ferrous sulfate with orange juice as well as oral vitamin B12 1000 mcg p.o. daily and blood transfusions PRN to keep Hbg >8.   INTERVAL HISTORY: Jeremiah [name] 78 y.o. male returns to the clinic today for follow up visit.  Discussed the use of AI scribe software for clinical note transcription with the patient, who gave verbal consent to proceed.  History of Present Illness   Jeremiah [name] is a 78 year old male with iron deficiency anemia secondary to chronic gastrointestinal hemorrhage who presents for evaluation and repeat blood work.  He has been treated in the past with iron infusions using Ferrlecit and Feraheme, and is currently taking oral ferrous sulfate with vitamin C. He also takes oral vitamin B12 tablets. He feels great physically, despite a drop in hemoglobin from 12 to 11.7 over the past month. His iron studies show stable serum iron at 66, iron saturation at 14%, and a decrease in ferritin from 21 to 17. He takes his iron supplements every morning with a couple of sips of orange juice, although he limits this due to his diabetes. No blood in his stool, with stools being well-formed and tan in color. He previously had a reaction to an iron infusion, experiencing lightheadedness and requiring EMS assistance, but was diagnosed with vertigo.  No chest pain, breathing issues, nausea, and dizzy spells, except for vertigo.       MEDICAL HISTORY: Past Medical History:  Diagnosis Date   A-fib [facility])    Acute on chronic systolic (congestive) heart  failure (HCC) 06/10/2017   Acute pulmonary edema (HCC)    Anemia    Aortic valve sclerosis 12/2018   Noted on ECHO   Arthritis    CAD (coronary artery disease)    a. 2006: CABG in 2006 with LIMA-LAD, SVG-OM1, and SVG-RPDA   Cardiomegaly 12/2018   Stable, noted on CXR   Carpal tunnel syndrome    Right   Chronic pain 03/21/2016   Colon cancer (HCC) 2006   COPD (chronic obstructive pulmonary disease) (HCC)    Diabetes mellitus without complication (HCC)    DVT (deep venous thrombosis) (HCC)    Right   Essential hypertension 03/21/2016   GERD (gastroesophageal reflux disease)    History of blood transfusion    History of Clostridioides difficile infection    History of prostate cancer 03/21/2016   History of PSVT (paroxysmal supraventricular tachycardia)    HLD (hyperlipidemia)    HTN (hypertension)    Hx of CABG 2006   Hypercholesteremia 03/21/2016   Hypothyroidism    LAE (left atrial enlargement) 12/2018   Severe, Noted on ECHO   LVH (left ventricular hypertrophy) 12/2018   Mild, Noted on ECHO   Medication management 03/21/2016   Mitral annular calcification 12/2018   with mild MS, Noted on ECHO   Morbid obesity (HCC) 03/21/2016   Myocardial infarct (HCC)    OSA (obstructive sleep apnea) 03/21/2016   uses oxygen at night time   Pain in right ankle and joints of right foot 03/21/2016  Paronychia 03/21/2016   Phimosis    Pneumonia    Primary insomnia 03/21/2016   Prostate cancer (HCC) 2008   Pulmonary hypertension (HCC) 12/2018   Moderate, Noted on ECHO   Tobacco dependence 03/21/2016   Tricuspid regurgitation 12/2018   Mild, Noted on ECHO    ALLERGIES:  is allergic to shellfish allergy and fluzone [influenza virus vaccine].  MEDICATIONS:  Current Outpatient Medications  Medication Sig Dispense Refill   acetaminophen (TYLENOL) 650 MG CR tablet Take 1,300 mg by mouth every 8 (eight) hours as needed for pain.     aspirin EC 81 MG tablet Take 1 tablet (81 mg  total) by mouth daily. Swallow whole. 90 tablet 3   atorvastatin (LIPITOR) 80 MG tablet Take 80 mg by mouth daily.     benzonatate (TESSALON) 200 MG capsule Take 200 mg by mouth 3 (three) times daily as needed.     budesonide (PULMICORT) 0.5 MG/2ML nebulizer solution Take 2 mLs (0.5 mg total) by nebulization 2 (two) times daily. 120 mL 11   cetirizine (ZYRTEC) 10 MG tablet Take 10 mg by mouth daily.     colchicine 0.6 MG tablet Take 0.6 mg by mouth daily as needed (Gout).     dexlansoprazole (DEXILANT) 60 MG capsule Take 60 mg by mouth daily as needed.     dicyclomine (BENTYL) 10 MG capsule Take 10 mg by mouth 3 (three) times daily before meals. 30 minutes before eating     ezetimibe (ZETIA) 10 MG tablet Take 1 tablet (10 mg total) by mouth daily. 90 tablet 3   ferrous sulfate 325 (65 FE) MG EC tablet Take 325 mg by mouth daily with breakfast.     fluticasone (FLONASE) 50 MCG/ACT nasal spray SHAKE LIQUID AND USE 1 SPRAY IN EACH NOSTRIL DAILY (Patient taking differently: Place 1 spray into both nostrils daily.) 16 g 2   furosemide (LASIX) 40 MG tablet Take 2 tablets (80 mg total) by mouth daily. (Patient taking differently: Take 80 mg by mouth 2 (two) times daily.) 30 tablet 0   glimepiride (AMARYL) 2 MG tablet Take 2 mg by mouth every morning.     guaiFENesin-codeine 100-10 MG/5ML syrup Take 5 mLs by mouth every 6 (six) hours as needed.     ketoconazole (NIZORAL) 2 % shampoo Apply 1 Application topically daily.     levothyroxine (SYNTHROID, LEVOTHROID) 88 MCG tablet Take 88 mcg by mouth daily before breakfast. Take on an empty stomach     meclizine (ANTIVERT) 25 MG tablet Take 1 tablet (25 mg total) by mouth 3 (three) times daily as needed for dizziness. 30 tablet 0   metFORMIN (GLUCOPHAGE-XR) 500 MG 24 hr tablet Take 1,000 mg by mouth daily.     omeprazole (PRILOSEC) 40 MG capsule Take 40 mg by mouth daily.     Potassium Chloride ER 20 MEQ TBCR Take 1 tablet by mouth daily.     revefenacin  (YUPELRI) 175 MCG/3ML nebulizer solution Take 175 mcg by nebulization in the morning and at bedtime.     spironolactone (ALDACTONE) 25 MG tablet Take 1 tablet (25 mg total) by mouth daily. 30 tablet 0   No current facility-administered medications for this visit.    SURGICAL HISTORY:  Past Surgical History:  Procedure Laterality Date   ANKLE SURGERY Right 12/2013   CARDIAC CATHETERIZATION N/A 05/08/2016   Procedure: Left Heart Cath and Cors/Grafts Angiography;  Surgeon: Lyn Records, MD;  Location: [facility] INVASIVE CV LAB;  Service: Cardiovascular;  Laterality: N/A;  CIRCUMCISION N/A 10/07/2019   Procedure: CIRCUMCISION ADULT;  Surgeon: Crist Fat, MD;  Location: [facility] ORS;  Service: Urology;  Laterality: N/A;   CORONARY ARTERY BYPASS GRAFT  2006   x3   ELECTROPHYSIOLOGIC STUDY N/A 08/24/2016   Procedure: SVT Ablation;  Surgeon: Will Jorja Loa, MD;  Location: [facility] INVASIVE CV LAB;  Service: Cardiovascular;  Laterality: N/A;   PACEMAKER IMPLANT N/A 08/23/2023   Procedure: PACEMAKER IMPLANT;  Surgeon: Regan Lemming, MD;  Location: [facility] INVASIVE CV LAB;  Service: Cardiovascular;  Laterality: N/A;   PROSTATE SURGERY  2008   PTCA     UMBILICAL HERNIA REPAIR N/A 05/26/2022   Procedure: PRIMARY REPAIR OF STRANGULATED UMBILICAL HERNIA WITH PARTIAL OMENTECTOMY;  Surgeon: Almond Lint, MD;  Location: [facility] OR;  Service: General;  Laterality: N/A;    REVIEW OF SYSTEMS:  A comprehensive review of systems was negative.   PHYSICAL EXAMINATION: General appearance: alert, cooperative, and no distress Head: Normocephalic, without obvious abnormality, atraumatic Neck: no adenopathy, no JVD, supple, symmetrical, trachea midline, and thyroid not enlarged, symmetric, no tenderness/mass/nodules Lymph nodes: Cervical, supraclavicular, and axillary nodes normal. Resp: clear to auscultation bilaterally Back: symmetric, no curvature. ROM normal. No CVA tenderness. Cardio: regular rate and rhythm, S1, S2  normal, no murmur, click, rub or gallop GI: soft, non-tender; bowel sounds normal; no masses,  no organomegaly Extremities: extremities normal, atraumatic, no cyanosis or edema  ECOG PERFORMANCE STATUS: 1 - Symptomatic but completely ambulatory  Blood pressure (!) 115/52, pulse (!) 104, temperature 98.3 F (36.8 C), resp. rate 17, height 5\' 8"  (1.727 m), weight 212 lb 1.6 oz (96.2 kg), SpO2 95%.  LABORATORY DATA: Lab Results  Component Value Date   WBC 5.2 03/02/2024   HGB 11.7 (L) 03/02/2024   HCT 37.3 (L) 03/02/2024   MCV 90.3 03/02/2024   PLT 187 03/02/2024      Chemistry      Component Value Date/Time   NA 139 02/03/2024 0941   NA 138 11/12/2023 1027   K 4.4 02/03/2024 0941   CL 99 02/03/2024 0941   CO2 32 02/03/2024 0941   BUN 19 02/03/2024 0941   BUN 19 11/12/2023 1027   CREATININE 0.98 02/03/2024 0941      Component Value Date/Time   CALCIUM 9.7 02/03/2024 0941   ALKPHOS 88 02/03/2024 0941   AST 16 02/03/2024 0941   ALT 16 02/03/2024 0941   BILITOT 0.4 02/03/2024 0941       RADIOGRAPHIC STUDIES: CUP PACEART REMOTE DEVICE CHECK Result Date: 02/24/2024 Scheduled remote reviewed. Normal device function.  Presenting rhythm: AS-VS/VP.  Multiple AHR detections, longest 10.5 minutes, and 2 VHR detections, EGMs consistent with AT and AFL. Next remote 91 days. - CS, CVRS   ASSESSMENT AND PLAN: This is a very pleasant 78 years old white male with history of iron deficiency anemia secondary to chronic gastrointestinal hemorrhage.  The patient has been on treatment with iron infusion as well as frequent PRBCs transfusion recently. He is currently on over-the-counter ferrous sulfate with vitamin C/orange juice as well as oral vitamin B12 and he is feeling fine.    Iron deficiency anemia secondary to chronic gastrointestinal hemorrhage Chronic iron deficiency anemia due to gastrointestinal bleeding. Hemoglobin decreased from 12 to 11.7 g/dL over the past month. Serum iron  is 66 mcg/dL, iron saturation is 16%, and ferritin decreased from 21 to 17 ng/mL. No current gastrointestinal bleeding symptoms. Previous iron infusions caused a reaction, possibly related to vertigo, leading to a decision to  continue with oral iron supplementation. He experienced lightheadedness after the last infusion, requiring EMS consultation. Given the previous reaction, the decision was made to continue with oral iron supplementation and increase the dosage. - Continue oral ferrous sulfate with vitamin C. - Increase iron tablet dosage to twice daily for the next few weeks. - Monitor hemoglobin and iron studies closely. - Consider iron infusion if oral supplementation is insufficient, with caution due to previous reaction.  Vertigo Persistent vertigo causing balance issues. Evaluated by an ENT specialist and scheduled to start physical therapy for vertigo management. Initially suspected to be related to a reaction from the iron infusion. - Start physical therapy for vertigo.  Peripheral artery disease Awaiting stabilization of hemoglobin levels for 3-4 months before starting anticoagulation and undergoing stent placement in the legs. He desires stable hemoglobin levels to proceed with the planned intervention. - Monitor hemoglobin levels for stability over the next 3-4 months. - Plan for initiation of anticoagulation and stent placement in legs once hemoglobin is stable.  Diabetes mellitus Diabetes management is ongoing. He is cautious with orange juice intake due to diabetes, opting for vitamin C as an alternative to aid iron absorption. - Continue current diabetes management plan.   The patient was advised to call immediately if he has any other concerning symptoms in the interval. The patient voices understanding of current disease status and treatment options and is in agreement with the current care plan.  All questions were answered. The patient knows to call the clinic with any  problems, questions or concerns. We can certainly see the patient much sooner if necessary.  The total time spent in the appointment was 20 minutes.  Disclaimer: This note was dictated with voice recognition software. Similar sounding words can inadvertently be transcribed and may not be corrected upon review.

## 2024-03-06 ENCOUNTER — Encounter: Payer: Self-pay | Admitting: Physical Therapy

## 2024-03-06 ENCOUNTER — Ambulatory Visit: Attending: Otolaryngology | Admitting: Physical Therapy

## 2024-03-06 VITALS — BP 88/53 | HR 86

## 2024-03-06 DIAGNOSIS — R42 Dizziness and giddiness: Secondary | ICD-10-CM | POA: Diagnosis present

## 2024-03-06 DIAGNOSIS — Z9181 History of falling: Secondary | ICD-10-CM | POA: Diagnosis present

## 2024-03-06 DIAGNOSIS — R2681 Unsteadiness on feet: Secondary | ICD-10-CM | POA: Insufficient documentation

## 2024-03-10 ENCOUNTER — Encounter: Payer: Self-pay | Admitting: Adult Health

## 2024-03-10 ENCOUNTER — Ambulatory Visit: Admitting: Adult Health

## 2024-03-10 VITALS — BP 98/60 | HR 93 | Temp 98.4°F | Ht 68.0 in | Wt 214.8 lb

## 2024-03-10 DIAGNOSIS — Z87891 Personal history of nicotine dependence: Secondary | ICD-10-CM

## 2024-03-10 DIAGNOSIS — J441 Chronic obstructive pulmonary disease with (acute) exacerbation: Secondary | ICD-10-CM | POA: Diagnosis not present

## 2024-03-10 MED ORDER — REVEFENACIN 175 MCG/3ML IN SOLN: 175.0000 ug | Freq: Every day | RESPIRATORY_TRACT | 5 refills | Status: DC

## 2024-03-10 MED ORDER — FORMOTEROL FUMARATE 20 MCG/2ML IN NEBU: 20.0000 ug | INHALATION_SOLUTION | Freq: Two times a day (BID) | RESPIRATORY_TRACT | 6 refills | Status: AC

## 2024-03-10 MED ORDER — BENZONATATE 200 MG PO CAPS: 200.0000 mg | ORAL_CAPSULE | Freq: Three times a day (TID) | ORAL | 3 refills | Status: DC | PRN

## 2024-03-10 MED ORDER — BUDESONIDE 0.5 MG/2ML IN SUSP: 0.5000 mg | Freq: Two times a day (BID) | RESPIRATORY_TRACT | 11 refills | Status: AC

## 2024-03-10 NOTE — Progress Notes (Addendum)
 @Patient  ID: Jeremiah [name], male    DOB: [birth year], 78 y.o.   MRN: [id number]  Chief Complaint  Patient presents with   Follow-up   Discussed the use of AI scribe software for clinical note transcription with the patient, who gave verbal consent to proceed.  Referring provider: [name], MD  HPI: 22 old male former smoker followed for COPD with emphysema and Chronic Respiratory Failure  Participates in the lung cancer CT chest screening program Medical history significant for Sleep apnea - CPAP intolerant.   TEST/EVENTS :  -Spirometry 02/12/2017  FEV1 0.79 (20%)  Ratio 45  - PFT's  04/01/2017  FEV1 1.29 ( 44% ) ratio 55   - PFT's  10/28/2018  FEV1 1.25 (43 % ) ratio 59  p 7 % improvement - [facility] one screen 10/28/2018   MM  Level 144  -PFT's  02/05/2019  FEV1 1.53 (53 % ) ratio 0.76  p 20 % improvement from saba  CT chest 12/2022-CT lung RADS 1 no suspicious nodules.  Emphysema.  03/10/2024 Follow up : COPD and O2 RF  Patient presents for a follow-up visit.  Last seen in July 2024.  He is followed for severe COPD and chronic respiratory failure on home oxygen. Complains of a constant cough that is sometimes dry and often productive of clear phlegm. The cough worsens when lying down, requiring him to sleep in a recliner, and it also occurs during conversations. A chest x-ray performed a month ago was reported as negative. He is currently using Pulmicort via nebulizer daily ,Yupelri twice daily  and Perforomist occasionally.  No coughing up blood or discolored phlegm.  He has a history of chronic respiratory failure and is on home oxygen therapy. He uses two liters of oxygen when walking and four liters at night due  He quit smoking approximately ten years ago.  He has severe sleep apnea and has tried multiple CPAP machines without success. He uses four liters of oxygen at night   He has a history of ischemic colitis,  He underwent an endoscopy with balloon dilation and cauterization at  [facility] about a month ago. He is no longer on blood thinners due to the bleeding . Has PAD with claudication symptoms in his legs.   He participates in a lung cancer CT chest screening program but is currently overdue for his screening.       Allergies  Allergen Reactions   Shellfish Allergy Shortness Of Breath, Nausea And Vomiting and Rash    Scallops     Fluzone [Influenza Virus Vaccine] Nausea And Vomiting, Palpitations and Rash    Immunization History  Administered Date(s) Administered   Moderna Covid-19 Vaccine Bivalent Booster 71yrs & up 08/17/2021   Moderna Sars-Covid-2 Vaccination 02/18/2020, 03/17/2020, 11/23/2020   Pneumococcal Conjugate-13 01/20/2018    Past Medical History:  Diagnosis Date   A-fib (HCC)    Acute on chronic systolic (congestive) heart failure (HCC) 06/10/2017   Acute pulmonary edema (HCC)    Anemia    Aortic valve sclerosis 12/2018   Noted on ECHO   Arthritis    CAD (coronary artery disease)    a. 2006: CABG in 2006 with LIMA-LAD, SVG-OM1, and SVG-RPDA   Cardiomegaly 12/2018   Stable, noted on CXR   Carpal tunnel syndrome    Right   Chronic pain 03/21/2016   Colon cancer (HCC) 2006   COPD (chronic obstructive pulmonary disease) (HCC)    Diabetes mellitus without complication (HCC)  DVT (deep venous thrombosis) (HCC)    Right   Essential hypertension 03/21/2016   GERD (gastroesophageal reflux disease)    History of blood transfusion    History of Clostridioides difficile infection    History of prostate cancer 03/21/2016   History of PSVT (paroxysmal supraventricular tachycardia)    HLD (hyperlipidemia)    HTN (hypertension)    Hx of CABG 2006   Hypercholesteremia 03/21/2016   Hypothyroidism    LAE (left atrial enlargement) 12/2018   Severe, Noted on ECHO   LVH (left ventricular hypertrophy) 12/2018   Mild, Noted on ECHO   Medication management 03/21/2016   Mitral annular calcification 12/2018   with mild MS,  Noted on ECHO   Morbid obesity (HCC) 03/21/2016   Myocardial infarct (HCC)    OSA (obstructive sleep apnea) 03/21/2016   uses oxygen at night time   Pain in right ankle and joints of right foot 03/21/2016   Paronychia 03/21/2016   Phimosis    Pneumonia    Primary insomnia 03/21/2016   Prostate cancer (HCC) 2008   Pulmonary hypertension (HCC) 12/2018   Moderate, Noted on ECHO   Tobacco dependence 03/21/2016   Tricuspid regurgitation 12/2018   Mild, Noted on ECHO    Tobacco History: Social History   Tobacco Use  Smoking Status Former   Current packs/day: 0.00   Average packs/day: 0.8 packs/day for 60.0 years (45.0 ttl pk-yrs)   Types: Cigarettes   Start date: 12/15/1956   Quit date: 12/15/2016   Years since quitting: 7.2  Smokeless Tobacco Never   Counseling given: Not Answered   Outpatient Medications Prior to Visit  Medication Sig Dispense Refill   aspirin EC 81 MG tablet Take 1 tablet (81 mg total) by mouth daily. Swallow whole. 90 tablet 3   atorvastatin (LIPITOR) 80 MG tablet Take 80 mg by mouth daily.     cetirizine (ZYRTEC) 10 MG tablet Take 10 mg by mouth daily.     colchicine 0.6 MG tablet Take 0.6 mg by mouth daily as needed (Gout).     dicyclomine (BENTYL) 10 MG capsule Take 10 mg by mouth 3 (three) times daily before meals. 30 minutes before eating     ferrous sulfate 325 (65 FE) MG EC tablet Take 325 mg by mouth daily with breakfast.     fluticasone (FLONASE) 50 MCG/ACT nasal spray SHAKE LIQUID AND USE 1 SPRAY IN EACH NOSTRIL DAILY (Patient taking differently: Place 1 spray into both nostrils daily.) 16 g 2   furosemide (LASIX) 40 MG tablet Take 2 tablets (80 mg total) by mouth daily. (Patient taking differently: Take 80 mg by mouth 2 (two) times daily.) 30 tablet 0   glimepiride (AMARYL) 2 MG tablet Take 4 mg by mouth every morning.     levothyroxine (SYNTHROID, LEVOTHROID) 88 MCG tablet Take 88 mcg by mouth daily before breakfast. Take on an empty stomach      metFORMIN (GLUCOPHAGE-XR) 500 MG 24 hr tablet Take 2,000 mg by mouth daily.     omeprazole (PRILOSEC) 40 MG capsule Take 40 mg by mouth daily.     Potassium Chloride ER 20 MEQ TBCR Take 1 tablet by mouth daily.     spironolactone (ALDACTONE) 25 MG tablet Take 1 tablet (25 mg total) by mouth daily. 30 tablet 0   budesonide (PULMICORT) 0.5 MG/2ML nebulizer solution Take 2 mLs (0.5 mg total) by nebulization 2 (two) times daily. 120 mL 11   revefenacin (YUPELRI) 175 MCG/3ML nebulizer solution Take 175 mcg  by nebulization daily.     ezetimibe (ZETIA) 10 MG tablet Take 1 tablet (10 mg total) by mouth daily. 90 tablet 3   acetaminophen (TYLENOL) 650 MG CR tablet Take 1,300 mg by mouth every 8 (eight) hours as needed for pain. (Patient not taking: Reported on 03/10/2024)     benzonatate (TESSALON) 200 MG capsule Take 200 mg by mouth 3 (three) times daily as needed. (Patient not taking: Reported on 03/10/2024)     dexlansoprazole (DEXILANT) 60 MG capsule Take 60 mg by mouth daily as needed. (Patient not taking: Reported on 03/10/2024)     guaiFENesin-codeine 100-10 MG/5ML syrup Take 5 mLs by mouth every 6 (six) hours as needed. (Patient not taking: Reported on 03/10/2024)     ketoconazole (NIZORAL) 2 % shampoo Apply 1 Application topically daily. (Patient not taking: Reported on 03/10/2024)     meclizine (ANTIVERT) 25 MG tablet Take 1 tablet (25 mg total) by mouth 3 (three) times daily as needed for dizziness. (Patient not taking: Reported on 03/10/2024) 30 tablet 0   No facility-administered medications prior to visit.     Review of Systems:   Constitutional:   No  weight loss, night sweats,  Fevers, chills,+ fatigue, or  lassitude.  HEENT:   No headaches,  Difficulty swallowing,  Tooth/dental problems, or  Sore throat,                No sneezing, itching, ear ache, nasal congestion, post nasal drip,   CV:  No chest pain,  Orthopnea, PND, swelling in lower extremities, anasarca, dizziness, palpitations,  syncope.   GI  No heartburn, indigestion, abdominal pain, nausea, vomiting, diarrhea, change in bowel habits, loss of appetite, bloody stools.   Resp:   No chest wall deformity  Skin: no rash or lesions.  GU: no dysuria, change in color of urine, no urgency or frequency.  No flank pain, no hematuria   MS:  No joint pain or swelling.  No decreased range of motion.  No back pain.    Physical Exam  BP 98/60 (BP Location: Left Arm, Patient Position: Sitting, Cuff Size: Normal)   Pulse 93   Temp 98.4 F (36.9 C) (Oral)   Ht 5\' 8"  (1.727 m)   Wt 214 lb 12.8 oz (97.4 kg)   SpO2 100%   BMI 32.66 kg/m   GEN: A/Ox3; pleasant , NAD, well nourished    HEENT:  [place]/AT,   NOSE-clear, THROAT-clear, no lesions, no postnasal drip or exudate noted.   NECK:  Supple w/ fair ROM; no JVD; normal carotid impulses w/o bruits; no thyromegaly or nodules palpated; no lymphadenopathy.    RESP  Clear  P & A; w/o, wheezes/ rales/ or rhonchi. no accessory muscle use, no dullness to percussion  CARD:  RRR, no m/r/g, no peripheral edema, pulses intact, no cyanosis or clubbing.  GI:   Soft & nt; nml bowel sounds; no organomegaly or masses detected.   Musco: Warm bil, no deformities or joint swelling noted.   Neuro: alert, no focal deficits noted.    Skin: Warm, no lesions or rashes    Lab Results:      Imaging: CUP PACEART REMOTE DEVICE CHECK Result Date: 02/24/2024 Scheduled remote reviewed. Normal device function.  Presenting rhythm: AS-VS/VP.  Multiple AHR detections, longest 10.5 minutes, and 2 VHR detections, EGMs consistent with AT and AFL. Next remote 91 days. - CS, CVRS   Administration History     None  Latest Ref Rng & Units 02/05/2019   12:56 PM 10/28/2018    8:51 AM 04/01/2017   10:20 AM  PFT Results  FVC-Pre L 2.09  2.00  2.33   FVC-Predicted Pre % 53  51  59   FVC-Post L 2.31  2.12  2.36   FVC-Predicted Post % 59  54  59   Pre FEV1/FVC % % 61  58  55   Post  FEV1/FCV % % 67  59  43   FEV1-Pre L 1.27  1.16  1.29   FEV1-Predicted Pre % 44  40  44   FEV1-Post L 1.53  1.25  1.01   DLCO uncorrected ml/min/mmHg 14.34  19.22  18.60   DLCO UNC% % 61  67  65   DLCO corrected ml/min/mmHg 18.95   19.10   DLCO COR %Predicted % 80   67   DLVA Predicted % 120  88  85   TLC L 4.83  5.58  5.76   TLC % Predicted % 75  86  89   RV % Predicted % 109  134  132     No results found for: "NITRICOXIDE"      Assessment & Plan:   Assessment and Plan    Chronic Obstructive Pulmonary Disease (COPD) with Emphysema   He has severe COPD with emphysema and chronic respiratory failure, requiring home oxygen therapy. He reports a persistent cough, sometimes dry and sometimes with clear phlegm, which worsens when lying down or talking for extended periods. The cough is not associated with discolored sputum or hemoptysis. He is currently on Pulmicort (budesonide) and Yupelri, with previous confusion regarding the dosing schedule. The correct regimen was clarified, emphasizing the importance of medication adherence to manage symptoms and prevent exacerbations. Risks of overusing Yupelri were discussed, highlighting the need for consistent nebulizer use to keep airways open. He is instructed to take Pulmicort (budesonide) nebulizer twice a day, Yupelri once a day, and restart Perforomist (formoterol) twice a day. Delsym (dextromethorphan) is prescribed over-the-counter for cough, 2 teaspoons twice a day, and Tessalon Perles (benzonatate) for cough, three times a day. Refills for Yupelri and budesonide are sent to [facility]. A message was sent to the lung cancer screening program to resume annual CT scans.  Chronic Respiratory Failure   He has chronic respiratory failure secondary to severe COPD and emphysema, requiring supplemental oxygen. He uses 2 liters of oxygen when ambulating and 4 liters at night   He will continue home oxygen therapy, 2 liters during ambulation and 4  liters at night.  Sleep Apnea   He has severe sleep apnea but is unable to tolerate CPAP therapy. He uses supplemental oxygen at night to manage symptoms. Previous attempts with CPAP and the decision to use oxygen therapy as an alternative were discussed. He will continue using 4 liters of oxygen at night.  Ischemic Colitis   He has ischemic colitis - underwent a specialized endoscopic procedure at [facility]. He is currently monitored with regular blood work to ensure the bleed has resolved and to manage anemia.  Keep follow up cards and hematology.   Peripheral Artery Disease (PAD)  -keep follow up with cardiology   Follow-up   Follow-up in 4 months and as needed    Rubye Oaks, NP 03/10/2024

## 2024-03-10 NOTE — Patient Instructions (Addendum)
 Increase Budesonide Neb Twice daily   Restart Perforomist neb Twice daily   Use Yupelri Neb daily  Activity as tolerated Continue on Oxygen 2l/m with activity and 4l/m At bedtime   Restart LDCT chest yearly screening program.  Delsym 2 tsp Twice daily for cough As needed   Tessalon Three times a day  for cough as needed.  Follow up with Dr. Sherene Sires in 4 months and As needed   Please contact office for sooner follow up if symptoms do not improve or worsen or seek emergency care

## 2024-03-10 NOTE — Addendum Note (Signed)
 Addended by: Julio Sicks on: 03/10/2024 06:26 PM   Modules accepted: Orders

## 2024-03-11 ENCOUNTER — Ambulatory Visit: Admitting: Physical Therapy

## 2024-03-13 ENCOUNTER — Encounter: Payer: Self-pay | Admitting: Physical Therapy

## 2024-03-13 ENCOUNTER — Ambulatory Visit: Admitting: Physical Therapy

## 2024-03-13 VITALS — BP 126/71 | HR 95

## 2024-03-13 DIAGNOSIS — R2681 Unsteadiness on feet: Secondary | ICD-10-CM

## 2024-03-13 DIAGNOSIS — R42 Dizziness and giddiness: Secondary | ICD-10-CM | POA: Diagnosis not present

## 2024-03-13 NOTE — Therapy (Signed)
 OUTPATIENT PHYSICAL THERAPY VESTIBULAR TREATMENT    Patient Name: Jeremiah [name] MRN: [id number] DOB:22-Mar-1946, 78 y.o., male Today's Date: 03/13/2024  END OF SESSION:  PT End of Session - 03/13/24 1235     Visit Number 2    Number of Visits 5    Date for PT Re-Evaluation 05/05/24    Authorization Type BLUE [employer]    PT Start Time 1234    PT Stop Time 1313    PT Time Calculation (min) 39 min    Equipment Utilized During Treatment Oxygen;Gait belt   2L O2   Activity Tolerance Patient tolerated treatment well    Behavior During Therapy WFL for tasks assessed/performed             Past Medical History:  Diagnosis Date   A-fib (HCC)    Acute on chronic systolic (congestive) heart failure (HCC) 06/10/2017   Acute pulmonary edema (HCC)    Anemia    Aortic valve sclerosis 12/2018   Noted on ECHO   Arthritis    CAD (coronary artery disease)    a. 2006: CABG in 2006 with LIMA-LAD, SVG-OM1, and SVG-RPDA   Cardiomegaly 12/2018   Stable, noted on CXR   Carpal tunnel syndrome    Right   Chronic pain 03/21/2016   Colon cancer (HCC) 2006   COPD (chronic obstructive pulmonary disease) (HCC)    Diabetes mellitus without complication (HCC)    DVT (deep venous thrombosis) (HCC)    Right   Essential hypertension 03/21/2016   GERD (gastroesophageal reflux disease)    History of blood transfusion    History of Clostridioides difficile infection    History of prostate cancer 03/21/2016   History of PSVT (paroxysmal supraventricular tachycardia)    HLD (hyperlipidemia)    HTN (hypertension)    Hx of CABG 2006   Hypercholesteremia 03/21/2016   Hypothyroidism    LAE (left atrial enlargement) 12/2018   Severe, Noted on ECHO   LVH (left ventricular hypertrophy) 12/2018   Mild, Noted on ECHO   Medication management 03/21/2016   Mitral annular calcification 12/2018   with mild MS, Noted on ECHO   Morbid obesity (HCC) 03/21/2016   Myocardial infarct (HCC)    OSA  (obstructive sleep apnea) 03/21/2016   uses oxygen at night time   Pain in right ankle and joints of right foot 03/21/2016   Paronychia 03/21/2016   Phimosis    Pneumonia    Primary insomnia 03/21/2016   Prostate cancer (HCC) 2008   Pulmonary hypertension (HCC) 12/2018   Moderate, Noted on ECHO   Tobacco dependence 03/21/2016   Tricuspid regurgitation 12/2018   Mild, Noted on ECHO   Past Surgical History:  Procedure Laterality Date   ANKLE SURGERY Right 12/2013   CARDIAC CATHETERIZATION N/A 05/08/2016   Procedure: Left Heart Cath and Cors/Grafts Angiography;  Surgeon: Lyn Records, MD;  Location: [facility] INVASIVE CV LAB;  Service: Cardiovascular;  Laterality: N/A;   CIRCUMCISION N/A 10/07/2019   Procedure: CIRCUMCISION ADULT;  Surgeon: Crist Fat, MD;  Location: [facility] ORS;  Service: Urology;  Laterality: N/A;   CORONARY ARTERY BYPASS GRAFT  2006   x3   ELECTROPHYSIOLOGIC STUDY N/A 08/24/2016   Procedure: SVT Ablation;  Surgeon: Will Jorja Loa, MD;  Location: [facility] INVASIVE CV LAB;  Service: Cardiovascular;  Laterality: N/A;   PACEMAKER IMPLANT N/A 08/23/2023   Procedure: PACEMAKER IMPLANT;  Surgeon: Regan Lemming, MD;  Location: [facility] INVASIVE CV LAB;  Service: Cardiovascular;  Laterality: N/A;  PROSTATE SURGERY  2008   PTCA     UMBILICAL HERNIA REPAIR N/A 05/26/2022   Procedure: PRIMARY REPAIR OF STRANGULATED UMBILICAL HERNIA WITH PARTIAL OMENTECTOMY;  Surgeon: Almond Lint, MD;  Location: [facility] OR;  Service: General;  Laterality: N/A;   Patient Active Problem List   Diagnosis Date Noted   Dizziness 02/13/2024   Sensorineural hearing loss, bilateral 02/13/2024   Iron deficiency anemia, unspecified 10/07/2023   S/P placement of cardiac pacemaker 08/24/2023   Complete heart block (HCC) 08/22/2023   Aortic stenosis 08/22/2023   Heart block 08/22/2023   Cardiogenic shock (HCC) 08/22/2023   Peripheral arterial disease (HCC) 08/13/2023   Chronic diastolic (congestive) heart  failure (HCC) 06/05/2023   Acute on chronic respiratory failure with hypoxia (HCC) 06/05/2023   Acute on chronic diastolic CHF (congestive heart failure) (HCC) 06/05/2023   Ischemic colitis (HCC) 02/18/2023   Pulmonary hypertension, unspecified (HCC) 02/18/2023   New onset atrial fibrillation (HCC) 02/18/2023   Mitral stenosis 02/18/2023   Iron deficiency anemia 10/07/2022   Former smoker 08/21/2022   Incarcerated umbilical hernia 05/26/2022   History of supraventricular tachycardia 05/26/2022   Type 2 diabetes mellitus with hyperglycemia, without long-term current use of insulin (HCC) 04/29/2020   Carotid artery calcification 09/09/2018   Coronary artery disease involving coronary bypass graft of native heart without angina pectoris 07/22/2017   COPD GOLD II with reversibility 04/03/2017   Chronic respiratory failure with hypoxia and hypercapnia (HCC) 04/03/2017   Hyperkalemia 02/26/2017   Prolonged QT interval 02/26/2017   Skin abscess 02/13/2017   Exertional shortness of breath 02/12/2017   Enteritis due to Clostridium difficile 01/05/2017   Acute GI bleeding 01/03/2017   Symptomatic anemia 01/03/2017   Hypothyroidism, acquired 01/03/2017   Cough    Diarrhea    AVNRT (AV nodal re-entry tachycardia) (HCC) 08/24/2016   NSTEMI (non-ST elevated myocardial infarction) (HCC) 05/05/2016   SVT (supraventricular tachycardia) (HCC) 03/21/2016   S/P CABG x 3 03/21/2016   Morbid obesity due to excess calories (HCC) complicated by hbp/hyperlipidemia  03/21/2016   Essential hypertension 03/21/2016   Hyperlipidemia LDL goal <70 03/21/2016   Paronychia 03/21/2016   Tobacco dependence 03/21/2016   Pain in right ankle and joints of right foot 03/21/2016   Chronic pain 03/21/2016   OSA (obstructive sleep apnea) 03/21/2016   Medication management 03/21/2016   History of prostate cancer 03/21/2016   Primary insomnia 03/21/2016   Post-traumatic osteoarthritis of right ankle 11/07/2015   Closed  displaced fracture of medial malleolus of right tibia 10/24/2015    PCP: [name], MD  REFERRING PROVIDER: Newman Pies, MD   REFERRING DIAG: R42 (ICD-10-CM) - Dizziness   THERAPY DIAG:  Dizziness and giddiness  Unsteadiness on feet  ONSET DATE: 03/03/2024  Rationale for Evaluation and Treatment: Rehabilitation  SUBJECTIVE:   SUBJECTIVE STATEMENT: No changes since he was last here.   Pt accompanied by: self  PERTINENT HISTORY: PMH: coronary artery disease, A-fib, aortic valve stenosis, status post coronary artery bypass graft and pacemaker placement, hx of severe anemia, COPD, diabetes mellitus, HTN, HLD, hx of MI in 2006  Pt currently awaiting heart valve surgery   Per Dr. Suszanne Conners: The patient describes his dizziness as a spinning vertigo that lasts for minutes. In between episodes, he also complains of lightheaded and off-balance sensation.   PAIN:  Are you having pain? No  Vitals:   03/13/24 1238 03/13/24 1239  BP: 129/66 126/71  Pulse: 91 95      PRECAUTIONS: Fall and ICD/Pacemaker  FALLS: Has patient fallen in last 6 months? Yes. Number of falls 2  LIVING ENVIRONMENT:  Has following equipment at home: Walker - 2 wheeled, does not use a walker, when his vertigo was really bad, needed to use it for a couple days  PLOF: Independent  PATIENT GOALS: Wants to be able to walk straight   OBJECTIVE:  Note: Objective measures were completed at Evaluation unless otherwise noted.  DIAGNOSTIC FINDINGS: 12/19/23:  CT Angio Head/Neck IMPRESSION: 1. No emergent large vessel occlusion. Widespread cerebrovascular atherosclerosis. 2. Severe stenosis of the left ICA distal cavernous segment and moderate right ICA cavernous segment stenosis. 3. Moderate stenosis of both vertebral artery V4 segments. 4. A 50% stenosis of the proximal right ICA.  COGNITION: Overall cognitive status: Within functional limits for tasks assessed   POSTURE:  rounded  shoulders   TRANSFERS: Assistive device utilized: None  Sit to stand: Modified independence Stand to sit: Modified independence Pt reports lightheadedness in standing when moving too quickly   GAIT: Gait pattern: decreased stride length and wide BOS Distance walked: Clinic distances  Assistive device utilized: None Level of assistance: Modified independence Comments: Walks with R toe out due to an old injury back in 2014   VESTIBULAR ASSESSMENT:  GENERAL OBSERVATION: Ambulates in independently with 2L of O2   SYMPTOM BEHAVIOR:  Subjective history: See above.   Non-Vestibular symptoms:  N/A  Type of dizziness: Imbalance (Disequilibrium) and Lightheadedness/Faint  Frequency: Daily   Duration: Maybe about 5 minutes, will sit down and start using his oxygen   Aggravating factors: Induced by position change: sit to stand and getting up quickly , will have to use the scooter at [employer] - unsure if it is because of a busier environment or doing more walking   Relieving factors:  Going to sleep or sitting down and putting his oxygen on   Progression of symptoms: Initially got better, then has stayed the same  OCULOMOTOR EXAM:  Ocular Alignment: normal  Ocular ROM: No Limitations  Spontaneous Nystagmus: absent  Gaze-Induced Nystagmus: absent  Smooth Pursuits: intact and jumpy at times   Saccades: intact   VESTIBULAR - OCULAR REFLEX:   Slow VOR: Normal  VOR Cancellation: Normal  Head-Impulse Test: Pt unable to relax neck, so unable to assess     M-CTSIB  Condition 1: Firm Surface, EO 30 Sec, Normal Sway  Condition 2: Firm Surface, EC 12.4 Sec  Condition 3: Foam Surface, EO 30 Sec, Mild Sway  Condition 4: Foam Surface, EC 7.8 Sec                                                                                                                              TREATMENT DATE:   Therapeutic Activity: Vitals:   03/13/24 1238 03/13/24 1239  BP: 129/66 126/71  Pulse: 91 95     [facility] PT Assessment - 03/13/24 1240       Functional Gait  Assessment   Gait  assessed  Yes    Gait Level Surface Walks 20 ft, slow speed, abnormal gait pattern, evidence for imbalance or deviates 10-15 in outside of the 12 in walkway width. Requires more than 7 sec to ambulate 20 ft.   8.8   Change in Gait Speed Able to change speed, demonstrates mild gait deviations, deviates 6-10 in outside of the 12 in walkway width, or no gait deviations, unable to achieve a major change in velocity, or uses a change in velocity, or uses an assistive device.    Gait with Horizontal Head Turns Performs head turns with moderate changes in gait velocity, slows down, deviates 10-15 in outside 12 in walkway width but recovers, can continue to walk.    Gait with Vertical Head Turns Performs task with slight change in gait velocity (eg, minor disruption to smooth gait path), deviates 6 - 10 in outside 12 in walkway width or uses assistive device    Gait and Pivot Turn Pivot turns safely within 3 sec and stops quickly with no loss of balance.    Step Over Obstacle Is able to step over one shoe box (4.5 in total height) but must slow down and adjust steps to clear box safely. May require verbal cueing.    Gait with Narrow Base of Support Ambulates less than 4 steps heel to toe or cannot perform without assistance.    Gait with Eyes Closed Walks 20 ft, slow speed, abnormal gait pattern, evidence for imbalance, deviates 10-15 in outside 12 in walkway width. Requires more than 9 sec to ambulate 20 ft.   veers to L, 11 seconds   Ambulating Backwards Walks 20 ft, slow speed, abnormal gait pattern, evidence for imbalance, deviates 10-15 in outside 12 in walkway width.   26 seconds   Steps Alternating feet, must use rail.    Total Score 14    FGA comment: 14/30 = High Fall Risk             Pt needing intermittent seated rest breaks during session due to fatigue in BLE and using his 2L of O2   Access Code: [email or web address] URL:  [email or web address] Date: 03/13/2024 Prepared by: Sherlie Ban  Initiated HEP for balance, see MedBridge for more details:   Exercises - Wide Stance with Eyes Closed on Foam Pad  - 2 x daily - 5 x weekly - 3 sets - 30 hold - Romberg Stance on Foam Pad with Head Rotation  - 2 x daily - 5 x weekly - 2 sets - 10 reps - also performing with head nods  - Tandem Walking with Counter Support  - 2 x daily - 5 x weekly - 3 sets - Standing Marching  - 2 x daily - 5 x weekly - 3 sets  PATIENT EDUCATION: Education details: results of FGA, initial HEP  Person educated: Patient Education method: Explanation, Verbal cues, and Handouts Education comprehension: verbalized understanding and returned demonstration  HOME EXERCISE PROGRAM: Access Code: [email or web address] URL: [email or web address] Date: 03/13/2024 Prepared by: Sherlie Ban  Exercises - Wide Stance with Eyes Closed on Foam Pad  - 2 x daily - 5 x weekly - 3 sets - 30 hold - Romberg Stance on Foam Pad with Head Rotation  - 2 x daily - 5 x weekly - 2 sets - 10 reps - Tandem Walking with Counter Support  - 2 x daily - 5 x weekly - 3 sets - Standing Marching  - 2 x daily - 5 x weekly - 3  sets  GOALS: Goals reviewed with patient? Yes  SHORT TERM GOALS: ALL STGS = LTGS  LONG TERM GOALS: Target date: 04/03/2024  Pt will be independent with final HEP for balance in order to build upon functional gains made in therapy. Baseline:  Goal status: INITIAL  2.  Pt will improve FGA to at least a 18/30 in order to demo decr fall risk.  Baseline: 14/30 Goal status: INITIAL  3.  Pt will improve condition 2 of mCTSIB to at least 30 seconds to demo improved balance with EC. Baseline: 12.4 Sec Goal status: INITIAL  4.  Pt will improve condition 4 of mCTSIB to at least 20 seconds to demo improved vestibular input for balance.  Baseline: 7.8 Sec Goal status: INITIAL    ASSESSMENT:  CLINICAL IMPRESSION: Assessed  orthostatics today with pt negative for orthostatic hypotension with no lightheadedness. Assessed FGA with pt scoring a 14/30, indicating a high fall risk. Pt most challenged by head motions, EC, tandem gait, and obstacles.LTG updated. Remainder of session focused on initiating HEP for balance with vestibular input, SLS, and tandem stance. Pt needing intermittent seated rest breaks during session due to BLE fatigue and needing to use his 2L of O2. Will continue per POC.    OBJECTIVE IMPAIRMENTS: Abnormal gait, cardiopulmonary status limiting activity, decreased activity tolerance, decreased balance, decreased endurance, decreased mobility, difficulty walking, dizziness, and postural dysfunction.   ACTIVITY LIMITATIONS: bending, standing, stairs, transfers, and locomotion level  PARTICIPATION LIMITATIONS: community activity and yard work  PERSONAL FACTORS: Age, Behavior pattern, Past/current experiences, Time since onset of injury/illness/exacerbation, and 3+ comorbidities: coronary artery disease, A-fib, aortic valve stenosis, status post coronary artery bypass graft and pacemaker placement, hx of severe anemia, COPD, diabetes mellitus, HTN, HLD, hx of MI in 2006  are also affecting patient's functional outcome.   REHAB POTENTIAL: Good  CLINICAL DECISION MAKING: Evolving/moderate complexity  EVALUATION COMPLEXITY: Moderate   PLAN:  PT FREQUENCY: 1x/week  PT DURATION: 8 weeks - anticipate 4 weeks   PLANNED INTERVENTIONS: 97164- PT Re-evaluation, 97110-Therapeutic exercises, 97530- Therapeutic activity, 97112- Neuromuscular re-education, 97535- Self Care, [id number]- Manual therapy, [phone]- Gait training, [phone]- Canalith repositioning, Patient/Family education, Balance training, Vestibular training, and DME instructions  PLAN FOR NEXT SESSION: work on balance with head motions, SLS, EC balance on level ground and foam. CHECK BP FOR ORTHOSTATICS    Chun Sellen Heloise Beecham, PT, DPT 03/13/2024, 1:14  PM

## 2024-03-18 ENCOUNTER — Ambulatory Visit: Admitting: Physical Therapy

## 2024-03-18 ENCOUNTER — Encounter: Payer: Self-pay | Admitting: Physical Therapy

## 2024-03-18 VITALS — BP 120/58 | HR 95

## 2024-03-18 DIAGNOSIS — R42 Dizziness and giddiness: Secondary | ICD-10-CM | POA: Diagnosis not present

## 2024-03-18 DIAGNOSIS — R2681 Unsteadiness on feet: Secondary | ICD-10-CM

## 2024-03-18 DIAGNOSIS — Z9181 History of falling: Secondary | ICD-10-CM

## 2024-03-18 NOTE — Therapy (Signed)
 OUTPATIENT PHYSICAL THERAPY VESTIBULAR TREATMENT    Patient Name: Jeremiah [name] MRN: [id number] DOB:1946-01-16, 78 y.o., male Today's Date: 03/18/2024  END OF SESSION:  PT End of Session - 03/18/24 1318     Visit Number 3    Number of Visits 5    Date for PT Re-Evaluation 05/05/24    Authorization Type [employer]    PT Start Time 1317    PT Stop Time 1356    PT Time Calculation (min) 39 min    Equipment Utilized During Treatment Oxygen;Gait belt   2L O2   Activity Tolerance Patient tolerated treatment well;Patient limited by fatigue    Behavior During Therapy WFL for tasks assessed/performed             Past Medical History:  Diagnosis Date   A-fib (HCC)    Acute on chronic systolic (congestive) heart failure (HCC) 06/10/2017   Acute pulmonary edema (HCC)    Anemia    Aortic valve sclerosis 12/2018   Noted on ECHO   Arthritis    CAD (coronary artery disease)    a. 2006: CABG in 2006 with LIMA-LAD, SVG-OM1, and SVG-RPDA   Cardiomegaly 12/2018   Stable, noted on CXR   Carpal tunnel syndrome    Right   Chronic pain 03/21/2016   Colon cancer (HCC) 2006   COPD (chronic obstructive pulmonary disease) (HCC)    Diabetes mellitus without complication (HCC)    DVT (deep venous thrombosis) (HCC)    Right   Essential hypertension 03/21/2016   GERD (gastroesophageal reflux disease)    History of blood transfusion    History of Clostridioides difficile infection    History of prostate cancer 03/21/2016   History of PSVT (paroxysmal supraventricular tachycardia)    HLD (hyperlipidemia)    HTN (hypertension)    Hx of CABG 2006   Hypercholesteremia 03/21/2016   Hypothyroidism    LAE (left atrial enlargement) 12/2018   Severe, Noted on ECHO   LVH (left ventricular hypertrophy) 12/2018   Mild, Noted on ECHO   Medication management 03/21/2016   Mitral annular calcification 12/2018   with mild MS, Noted on ECHO   Morbid obesity (HCC) 03/21/2016   Myocardial  infarct (HCC)    OSA (obstructive sleep apnea) 03/21/2016   uses oxygen at night time   Pain in right ankle and joints of right foot 03/21/2016   Paronychia 03/21/2016   Phimosis    Pneumonia    Primary insomnia 03/21/2016   Prostate cancer (HCC) 2008   Pulmonary hypertension (HCC) 12/2018   Moderate, Noted on ECHO   Tobacco dependence 03/21/2016   Tricuspid regurgitation 12/2018   Mild, Noted on ECHO   Past Surgical History:  Procedure Laterality Date   ANKLE SURGERY Right 12/2013   CARDIAC CATHETERIZATION N/A 05/08/2016   Procedure: Left Heart Cath and Cors/Grafts Angiography;  Surgeon: Arty Binning, MD;  Location: [facility] INVASIVE CV LAB;  Service: Cardiovascular;  Laterality: N/A;   CIRCUMCISION N/A 10/07/2019   Procedure: CIRCUMCISION ADULT;  Surgeon: Andrez Banker, MD;  Location: [facility] ORS;  Service: Urology;  Laterality: N/A;   CORONARY ARTERY BYPASS GRAFT  2006   x3   ELECTROPHYSIOLOGIC STUDY N/A 08/24/2016   Procedure: SVT Ablation;  Surgeon: Will Cortland Ding, MD;  Location: [facility] INVASIVE CV LAB;  Service: Cardiovascular;  Laterality: N/A;   PACEMAKER IMPLANT N/A 08/23/2023   Procedure: PACEMAKER IMPLANT;  Surgeon: Lei Pump, MD;  Location: [facility] INVASIVE CV LAB;  Service: Cardiovascular;  Laterality: N/A;   PROSTATE SURGERY  2008   PTCA     UMBILICAL HERNIA REPAIR N/A 05/26/2022   Procedure: PRIMARY REPAIR OF STRANGULATED UMBILICAL HERNIA WITH PARTIAL OMENTECTOMY;  Surgeon: Lockie Rima, MD;  Location: [facility] OR;  Service: General;  Laterality: N/A;   Patient Active Problem List   Diagnosis Date Noted   Dizziness 02/13/2024   Sensorineural hearing loss, bilateral 02/13/2024   Iron deficiency anemia, unspecified 10/07/2023   S/P placement of cardiac pacemaker 08/24/2023   Complete heart block (HCC) 08/22/2023   Aortic stenosis 08/22/2023   Heart block 08/22/2023   Cardiogenic shock (HCC) 08/22/2023   Peripheral arterial disease (HCC) 08/13/2023   Chronic diastolic  (congestive) heart failure (HCC) 06/05/2023   Acute on chronic respiratory failure with hypoxia (HCC) 06/05/2023   Acute on chronic diastolic CHF (congestive heart failure) (HCC) 06/05/2023   Ischemic colitis (HCC) 02/18/2023   Pulmonary hypertension, unspecified (HCC) 02/18/2023   New onset atrial fibrillation (HCC) 02/18/2023   Mitral stenosis 02/18/2023   Iron deficiency anemia 10/07/2022   Former smoker 08/21/2022   Incarcerated umbilical hernia 05/26/2022   History of supraventricular tachycardia 05/26/2022   Type 2 diabetes mellitus with hyperglycemia, without long-term current use of insulin (HCC) 04/29/2020   Carotid artery calcification 09/09/2018   Coronary artery disease involving coronary bypass graft of native heart without angina pectoris 07/22/2017   COPD GOLD II with reversibility 04/03/2017   Chronic respiratory failure with hypoxia and hypercapnia (HCC) 04/03/2017   Hyperkalemia 02/26/2017   Prolonged QT interval 02/26/2017   Skin abscess 02/13/2017   Exertional shortness of breath 02/12/2017   Enteritis due to Clostridium difficile 01/05/2017   Acute GI bleeding 01/03/2017   Symptomatic anemia 01/03/2017   Hypothyroidism, acquired 01/03/2017   Cough    Diarrhea    AVNRT (AV nodal re-entry tachycardia) (HCC) 08/24/2016   NSTEMI (non-ST elevated myocardial infarction) (HCC) 05/05/2016   SVT (supraventricular tachycardia) (HCC) 03/21/2016   S/P CABG x 3 03/21/2016   Morbid obesity due to excess calories (HCC) complicated by hbp/hyperlipidemia  03/21/2016   Essential hypertension 03/21/2016   Hyperlipidemia LDL goal <70 03/21/2016   Paronychia 03/21/2016   Tobacco dependence 03/21/2016   Pain in right ankle and joints of right foot 03/21/2016   Chronic pain 03/21/2016   OSA (obstructive sleep apnea) 03/21/2016   Medication management 03/21/2016   History of prostate cancer 03/21/2016   Primary insomnia 03/21/2016   Post-traumatic osteoarthritis of right ankle  11/07/2015   Closed displaced fracture of medial malleolus of right tibia 10/24/2015    PCP: Lanae Pinal, MD  REFERRING PROVIDER: Reynold Caves, MD   REFERRING DIAG: R42 (ICD-10-CM) - Dizziness   THERAPY DIAG:  Dizziness and giddiness  Unsteadiness on feet  History of falling  ONSET DATE: 03/03/2024  Rationale for Evaluation and Treatment: Rehabilitation  SUBJECTIVE:   SUBJECTIVE STATEMENT: Pt has tried his exercises at home. Tried some of them in the shower. PT advised against this.   Pt accompanied by: self  PERTINENT HISTORY: PMH: coronary artery disease, A-fib, aortic valve stenosis, status post coronary artery bypass graft and pacemaker placement, hx of severe anemia, COPD, diabetes mellitus, HTN, HLD, hx of MI in 2006  Pt currently awaiting heart valve surgery   Per Dr. Darlin Ehrlich: The patient describes his dizziness as a spinning vertigo that lasts for minutes. In between episodes, he also complains of lightheaded and off-balance sensation.   PAIN:  Are you having pain? No  Vitals:   03/18/24 1320  BP: (!) 120/58  Pulse: 95    PRECAUTIONS: Fall and ICD/Pacemaker  FALLS: Has patient fallen in last 6 months? Yes. Number of falls 2  LIVING ENVIRONMENT:  Has following equipment at home: Walker - 2 wheeled, does not use a walker, when his vertigo was really bad, needed to use it for a couple days  PLOF: Independent  PATIENT GOALS: Wants to be able to walk straight   OBJECTIVE:  Note: Objective measures were completed at Evaluation unless otherwise noted.  DIAGNOSTIC FINDINGS: 12/19/23:  CT Angio Head/Neck IMPRESSION: 1. No emergent large vessel occlusion. Widespread cerebrovascular atherosclerosis. 2. Severe stenosis of the left ICA distal cavernous segment and moderate right ICA cavernous segment stenosis. 3. Moderate stenosis of both vertebral artery V4 segments. 4. A 50% stenosis of the proximal right ICA.  COGNITION: Overall cognitive status: Within  functional limits for tasks assessed   POSTURE:  rounded shoulders   TRANSFERS: Assistive device utilized: None  Sit to stand: Modified independence Stand to sit: Modified independence Pt reports lightheadedness in standing when moving too quickly   GAIT: Gait pattern: decreased stride length and wide BOS Distance walked: Clinic distances  Assistive device utilized: None Level of assistance: Modified independence Comments: Walks with R toe out due to an old injury back in 2014   VESTIBULAR ASSESSMENT:  GENERAL OBSERVATION: Ambulates in independently with 2L of O2   SYMPTOM BEHAVIOR:  Subjective history: See above.   Non-Vestibular symptoms:  N/A  Type of dizziness: Imbalance (Disequilibrium) and Lightheadedness/Faint  Frequency: Daily   Duration: Maybe about 5 minutes, will sit down and start using his oxygen   Aggravating factors: Induced by position change: sit to stand and getting up quickly , will have to use the scooter at [employer] - unsure if it is because of a busier environment or doing more walking   Relieving factors:  Going to sleep or sitting down and putting his oxygen on   Progression of symptoms: Initially got better, then has stayed the same  OCULOMOTOR EXAM:  Ocular Alignment: normal  Ocular ROM: No Limitations  Spontaneous Nystagmus: absent  Gaze-Induced Nystagmus: absent  Smooth Pursuits: intact and jumpy at times   Saccades: intact   VESTIBULAR - OCULAR REFLEX:   Slow VOR: Normal  VOR Cancellation: Normal  Head-Impulse Test: Pt unable to relax neck, so unable to assess     M-CTSIB  Condition 1: Firm Surface, EO 30 Sec, Normal Sway  Condition 2: Firm Surface, EC 12.4 Sec  Condition 3: Foam Surface, EO 30 Sec, Mild Sway  Condition 4: Foam Surface, EC 7.8 Sec                                                                                                                              TREATMENT DATE:     Vitals:   03/18/24 1320  BP: (!) 120/58   Pulse: 95   NMR: On level ground: Feet together EC 3 x  30 seconds, mild postural sway Feet hip width > more narrow BOS EC  2 x 10 reps head turns, 2 x 10 reps head nods, pt more unsteady with nods  Tandem stance: 2 x 20-30 seconds, pt with improved balance with 2nd rep  Pt needing a seated rest break after to put on 2L O2 with O2 at 98% and HR at 106 bpm   Gait down hallway: With head turns 4 x 20', with head nods 4 x 20' with pt needing fingertip support for balance, cues for full ROM   In // bars: Alternating SLS taps to 2 cones for single leg stability/weight shifting 2 x 10 reps, standing rest break between each set  Pt reports legs felt like they were burning afterwards, was unsteady after ambulating outside of bars and sits down on chair for a prolonged rest break   O2 at end of session at 98% and HR at 108 bpm   PATIENT EDUCATION: Education details: Continue HEP, addition of gait with head motions to HEP for pt to perform in hallway  Person educated: Patient Education method: Explanation, Verbal cues, and Handouts Education comprehension: verbalized understanding and returned demonstration  HOME EXERCISE PROGRAM: Access Code: [email or web address] URL: [email or web address] Date: 03/18/2024 Prepared by: Jonathan Neighbor  Exercises - Wide Stance with Eyes Closed on Foam Pad  - 2 x daily - 5 x weekly - 3 sets - 30 hold - Romberg Stance on Foam Pad with Head Rotation  - 2 x daily - 5 x weekly - 2 sets - 10 reps - Tandem Walking with Counter Support  - 2 x daily - 5 x weekly - 3 sets - Standing Marching  - 2 x daily - 5 x weekly - 3 sets - Walking with Head Rotation  - 1-2 x daily - 5 x weekly - 3 sets and head nods   GOALS: Goals reviewed with patient? Yes  SHORT TERM GOALS: ALL STGS = LTGS  LONG TERM GOALS: Target date: 04/03/2024  Pt will be independent with final HEP for balance in order to build upon functional gains made in therapy. Baseline:  Goal status:  INITIAL  2.  Pt will improve FGA to at least a 18/30 in order to demo decr fall risk.  Baseline: 14/30 Goal status: INITIAL  3.  Pt will improve condition 2 of mCTSIB to at least 30 seconds to demo improved balance with EC. Baseline: 12.4 Sec Goal status: INITIAL  4.  Pt will improve condition 4 of mCTSIB to at least 20 seconds to demo improved vestibular input for balance.  Baseline: 7.8 Sec Goal status: INITIAL    ASSESSMENT:  CLINICAL IMPRESSION: Today's skilled session focused on balance tasks with narrow BOS, EC, SLS, and gait with head motions. Pt needing intermittent seated rest breaks during session to use 2L of O2 due to BLE fatigue. Pt's O2 WNL during session. Added gait with head motions down hallway to HEP as pt unsteady with this. Pt demonstrated improvement in balance with EC on level ground compared to previous session. Will continue per POC.    OBJECTIVE IMPAIRMENTS: Abnormal gait, cardiopulmonary status limiting activity, decreased activity tolerance, decreased balance, decreased endurance, decreased mobility, difficulty walking, dizziness, and postural dysfunction.   ACTIVITY LIMITATIONS: bending, standing, stairs, transfers, and locomotion level  PARTICIPATION LIMITATIONS: community activity and yard work  PERSONAL FACTORS: Age, Behavior pattern, Past/current experiences, Time since onset of injury/illness/exacerbation, and 3+ comorbidities: coronary artery disease, A-fib, aortic valve stenosis, status post coronary artery  bypass graft and pacemaker placement, hx of severe anemia, COPD, diabetes mellitus, HTN, HLD, hx of MI in 2006  are also affecting patient's functional outcome.   REHAB POTENTIAL: Good  CLINICAL DECISION MAKING: Evolving/moderate complexity  EVALUATION COMPLEXITY: Moderate   PLAN:  PT FREQUENCY: 1x/week  PT DURATION: 8 weeks - anticipate 4 weeks   PLANNED INTERVENTIONS: 97164- PT Re-evaluation, 97110-Therapeutic exercises, 97530-  Therapeutic activity, 97112- Neuromuscular re-education, 97535- Self Care, 16109- Manual therapy, [phone]- Gait training, [phone]- Canalith repositioning, Patient/Family education, Balance training, Vestibular training, and DME instructions  PLAN FOR NEXT SESSION: work on balance with head motions, SLS, EC balance on level ground and foam. CHECK BP FOR ORTHOSTATICS    Seabron Cypress, PT, DPT 03/18/2024, 2:05 PM

## 2024-03-25 ENCOUNTER — Encounter: Payer: Self-pay | Admitting: Physical Therapy

## 2024-03-25 ENCOUNTER — Telehealth: Payer: Self-pay | Admitting: Adult Health

## 2024-03-25 ENCOUNTER — Ambulatory Visit: Admitting: Physical Therapy

## 2024-03-25 VITALS — BP 114/66 | HR 91

## 2024-03-25 DIAGNOSIS — R2681 Unsteadiness on feet: Secondary | ICD-10-CM

## 2024-03-25 DIAGNOSIS — R42 Dizziness and giddiness: Secondary | ICD-10-CM | POA: Diagnosis not present

## 2024-03-25 DIAGNOSIS — Z9181 History of falling: Secondary | ICD-10-CM

## 2024-03-25 NOTE — Telephone Encounter (Signed)
 While on the phone with the patient scheduling his annual LDCT he states he had trouble filling his Yupelri  script at the retail pharmacy and requests this script be sent to [facility]. Patient request a call once this has been completed.

## 2024-03-25 NOTE — Therapy (Signed)
 OUTPATIENT PHYSICAL THERAPY VESTIBULAR TREATMENT    Patient Name: Jeremiah [name] MRN: [id number] DOB:[birth year], 78 y.o., male Today's Date: 03/25/2024  END OF SESSION:  PT End of Session - 03/25/24 1320     Visit Number 4    Number of Visits 5    Date for PT Re-Evaluation 05/05/24    Authorization Type [employer]    PT Start Time 1318    PT Stop Time 1357    PT Time Calculation (min) 39 min    Equipment Utilized During Treatment Oxygen ;Gait belt   2L O2   Activity Tolerance Patient tolerated treatment well;Patient limited by fatigue   limited due to decr O2   Behavior During Therapy WFL for tasks assessed/performed             Past Medical History:  Diagnosis Date   A-fib (HCC)    Acute on chronic systolic (congestive) heart failure (HCC) 06/10/2017   Acute pulmonary edema (HCC)    Anemia    Aortic valve sclerosis 12/2018   Noted on ECHO   Arthritis    CAD (coronary artery disease)    a. 2006: CABG in 2006 with LIMA-LAD, SVG-OM1, and SVG-RPDA   Cardiomegaly 12/2018   Stable, noted on CXR   Carpal tunnel syndrome    Right   Chronic pain 03/21/2016   Colon cancer (HCC) 2006   COPD (chronic obstructive pulmonary disease) (HCC)    Diabetes mellitus without complication (HCC)    DVT (deep venous thrombosis) (HCC)    Right   Essential hypertension 03/21/2016   GERD (gastroesophageal reflux disease)    History of blood transfusion    History of Clostridioides difficile infection    History of prostate cancer 03/21/2016   History of PSVT (paroxysmal supraventricular tachycardia)    HLD (hyperlipidemia)    HTN (hypertension)    Hx of CABG 2006   Hypercholesteremia 03/21/2016   Hypothyroidism    LAE (left atrial enlargement) 12/2018   Severe, Noted on ECHO   LVH (left ventricular hypertrophy) 12/2018   Mild, Noted on ECHO   Medication management 03/21/2016   Mitral annular calcification 12/2018   with mild MS, Noted on ECHO   Morbid obesity (HCC)  03/21/2016   Myocardial infarct (HCC)    OSA (obstructive sleep apnea) 03/21/2016   uses oxygen  at night time   Pain in right ankle and joints of right foot 03/21/2016   Paronychia 03/21/2016   Phimosis    Pneumonia    Primary insomnia 03/21/2016   Prostate cancer (HCC) 2008   Pulmonary hypertension (HCC) 12/2018   Moderate, Noted on ECHO   Tobacco dependence 03/21/2016   Tricuspid regurgitation 12/2018   Mild, Noted on ECHO   Past Surgical History:  Procedure Laterality Date   ANKLE SURGERY Right 12/2013   CARDIAC CATHETERIZATION N/A 05/08/2016   Procedure: Left Heart Cath and Cors/Grafts Angiography;  Surgeon: Arty Binning, MD;  Location: [facility] INVASIVE CV LAB;  Service: Cardiovascular;  Laterality: N/A;   CIRCUMCISION N/A 10/07/2019   Procedure: CIRCUMCISION ADULT;  Surgeon: Andrez Banker, MD;  Location: [facility] ORS;  Service: Urology;  Laterality: N/A;   CORONARY ARTERY BYPASS GRAFT  2006   x3   ELECTROPHYSIOLOGIC STUDY N/A 08/24/2016   Procedure: SVT Ablation;  Surgeon: Will Cortland Ding, MD;  Location: [facility] INVASIVE CV LAB;  Service: Cardiovascular;  Laterality: N/A;   PACEMAKER IMPLANT N/A 08/23/2023   Procedure: PACEMAKER IMPLANT;  Surgeon: Lei Pump, MD;  Location: [facility]  INVASIVE CV LAB;  Service: Cardiovascular;  Laterality: N/A;   PROSTATE SURGERY  2008   PTCA     UMBILICAL HERNIA REPAIR N/A 05/26/2022   Procedure: PRIMARY REPAIR OF STRANGULATED UMBILICAL HERNIA WITH PARTIAL OMENTECTOMY;  Surgeon: Lockie Rima, MD;  Location: [facility] OR;  Service: General;  Laterality: N/A;   Patient Active Problem List   Diagnosis Date Noted   Dizziness 02/13/2024   Sensorineural hearing loss, bilateral 02/13/2024   Iron  deficiency anemia, unspecified 10/07/2023   S/P placement of cardiac pacemaker 08/24/2023   Complete heart block (HCC) 08/22/2023   Aortic stenosis 08/22/2023   Heart block 08/22/2023   Cardiogenic shock (HCC) 08/22/2023   Peripheral arterial disease (HCC)  08/13/2023   Chronic diastolic (congestive) heart failure (HCC) 06/05/2023   Acute on chronic respiratory failure with hypoxia (HCC) 06/05/2023   Acute on chronic diastolic CHF (congestive heart failure) (HCC) 06/05/2023   Ischemic colitis (HCC) 02/18/2023   Pulmonary hypertension, unspecified (HCC) 02/18/2023   New onset atrial fibrillation (HCC) 02/18/2023   Mitral stenosis 02/18/2023   Iron  deficiency anemia 10/07/2022   Former smoker 08/21/2022   Incarcerated umbilical hernia 05/26/2022   History of supraventricular tachycardia 05/26/2022   Type 2 diabetes mellitus with hyperglycemia, without long-term current use of insulin  (HCC) 04/29/2020   Carotid artery calcification 09/09/2018   Coronary artery disease involving coronary bypass graft of native heart without angina pectoris 07/22/2017   COPD GOLD II with reversibility 04/03/2017   Chronic respiratory failure with hypoxia and hypercapnia (HCC) 04/03/2017   Hyperkalemia 02/26/2017   Prolonged QT interval 02/26/2017   Skin abscess 02/13/2017   Exertional shortness of breath 02/12/2017   Enteritis due to Clostridium difficile 01/05/2017   Acute GI bleeding 01/03/2017   Symptomatic anemia 01/03/2017   Hypothyroidism, acquired 01/03/2017   Cough    Diarrhea    AVNRT (AV nodal re-entry tachycardia) (HCC) 08/24/2016   NSTEMI (non-ST elevated myocardial infarction) (HCC) 05/05/2016   SVT (supraventricular tachycardia) (HCC) 03/21/2016   S/P CABG x 3 03/21/2016   Morbid obesity due to excess calories (HCC) complicated by hbp/hyperlipidemia  03/21/2016   Essential hypertension 03/21/2016   Hyperlipidemia LDL goal <70 03/21/2016   Paronychia 03/21/2016   Tobacco dependence 03/21/2016   Pain in right ankle and joints of right foot 03/21/2016   Chronic pain 03/21/2016   OSA (obstructive sleep apnea) 03/21/2016   Medication management 03/21/2016   History of prostate cancer 03/21/2016   Primary insomnia 03/21/2016   Post-traumatic  osteoarthritis of right ankle 11/07/2015   Closed displaced fracture of medial malleolus of right tibia 10/24/2015    PCP: Lanae Pinal, MD  REFERRING PROVIDER: Reynold Caves, MD   REFERRING DIAG: R42 (ICD-10-CM) - Dizziness   THERAPY DIAG:  Dizziness and giddiness  Unsteadiness on feet  History of falling  ONSET DATE: 03/03/2024  Rationale for Evaluation and Treatment: Rehabilitation  SUBJECTIVE:   SUBJECTIVE STATEMENT: Has been feeling nauseous all day. Took an anti-nausea pill before coming here. PT mentioned rescheduling, but pt wants to keep his appt. Notes things are going ok with his balance, still feels like he walks like a duck. No falls.   Pt accompanied by: self  PERTINENT HISTORY: PMH: coronary artery disease, A-fib, aortic valve stenosis, status post coronary artery bypass graft and pacemaker placement, hx of severe anemia, COPD, diabetes mellitus, HTN, HLD, hx of MI in 2006  Pt currently awaiting heart valve surgery   Per Dr. Darlin Ehrlich: The patient describes his dizziness as a spinning vertigo  that lasts for minutes. In between episodes, he also complains of lightheaded and off-balance sensation.   PAIN:  Are you having pain? No  Vitals:   03/25/24 1322  BP: 114/66  Pulse: 91  SpO2: 97%     PRECAUTIONS: Fall and ICD/Pacemaker  FALLS: Has patient fallen in last 6 months? Yes. Number of falls 2  LIVING ENVIRONMENT:  Has following equipment at home: Walker - 2 wheeled, does not use a walker, when his vertigo was really bad, needed to use it for a couple days  PLOF: Independent  PATIENT GOALS: Wants to be able to walk straight   OBJECTIVE:  Note: Objective measures were completed at Evaluation unless otherwise noted.  DIAGNOSTIC FINDINGS: 12/19/23:  CT Angio Head/Neck IMPRESSION: 1. No emergent large vessel occlusion. Widespread cerebrovascular atherosclerosis. 2. Severe stenosis of the left ICA distal cavernous segment and moderate right ICA  cavernous segment stenosis. 3. Moderate stenosis of both vertebral artery V4 segments. 4. A 50% stenosis of the proximal right ICA.  COGNITION: Overall cognitive status: Within functional limits for tasks assessed   POSTURE:  rounded shoulders   TRANSFERS: Assistive device utilized: None  Sit to stand: Modified independence Stand to sit: Modified independence Pt reports lightheadedness in standing when moving too quickly   GAIT: Gait pattern: decreased stride length and wide BOS Distance walked: Clinic distances  Assistive device utilized: None Level of assistance: Modified independence Comments: Walks with R toe out due to an old injury back in 2014   VESTIBULAR ASSESSMENT:  GENERAL OBSERVATION: Ambulates in independently with 2L of O2   SYMPTOM BEHAVIOR:  Subjective history: See above.   Non-Vestibular symptoms:  N/A  Type of dizziness: Imbalance (Disequilibrium) and Lightheadedness/Faint  Frequency: Daily   Duration: Maybe about 5 minutes, will sit down and start using his oxygen    Aggravating factors: Induced by position change: sit to stand and getting up quickly , will have to use the scooter at [employer] - unsure if it is because of a busier environment or doing more walking   Relieving factors:  Going to sleep or sitting down and putting his oxygen  on   Progression of symptoms: Initially got better, then has stayed the same  OCULOMOTOR EXAM:  Ocular Alignment: normal  Ocular ROM: No Limitations  Spontaneous Nystagmus: absent  Gaze-Induced Nystagmus: absent  Smooth Pursuits: intact and jumpy at times   Saccades: intact   VESTIBULAR - OCULAR REFLEX:   Slow VOR: Normal  VOR Cancellation: Normal  Head-Impulse Test: Pt unable to relax neck, so unable to assess     M-CTSIB  Condition 1: Firm Surface, EO 30 Sec, Normal Sway  Condition 2: Firm Surface, EC 12.4 Sec  Condition 3: Foam Surface, EO 30 Sec, Mild Sway  Condition 4: Foam Surface, EC 7.8 Sec  TREATMENT DATE:   Vitals:   03/25/24 1322  BP: 114/66  Pulse: 91  SpO2: 97%    NMR: On air ex: 10 reps sit <> stands with no UE support. O2 afterwards at 91% with pt putting on 2L of O2 and HR at 122 bpm and O2 incr to 96% with a seated rest break  Alternating SLS taps to 6" step 2 x 10 reps, seated rest break between each rep  Feet hip width with EC 3 x 30 seconds, needing seated rest break afterwards   On blue mat:  Forward/retro walking down and back x1 rep, then performed forward/retro marching down and back x3 reps, HR after at 119 bpm and O2 at 93% with pt needing to use 2L of O2, with seated rest break O2 incr back up to 96%  Side stepping down and back x4 reps with bringing feet together with focus on picking up feet   On level ground: Attempting forward stepping with head turns to R/L, but was too much to coordinate, instead just performed alternating forward stepping 10 reps. Afterwards pt's O2 at 93%, with pt needing seated rest break and 2L of O2   PATIENT EDUCATION: Education details: Continue HEP Person educated: Patient Education method: Explanation and Verbal cues Education comprehension: verbalized understanding and returned demonstration  HOME EXERCISE PROGRAM: Access Code: [email or web address] URL: [email or web address] Date: 03/18/2024 Prepared by: Jonathan Neighbor  Exercises - Wide Stance with Eyes Closed on Foam Pad  - 2 x daily - 5 x weekly - 3 sets - 30 hold - Romberg Stance on Foam Pad with Head Rotation  - 2 x daily - 5 x weekly - 2 sets - 10 reps - Tandem Walking with Counter Support  - 2 x daily - 5 x weekly - 3 sets - Standing Marching  - 2 x daily - 5 x weekly - 3 sets - Walking with Head Rotation  - 1-2 x daily - 5 x weekly - 3 sets and head nods   GOALS: Goals reviewed with patient? Yes  SHORT TERM GOALS: ALL STGS =  LTGS  LONG TERM GOALS: Target date: 04/03/2024  Pt will be independent with final HEP for balance in order to build upon functional gains made in therapy. Baseline:  Goal status: INITIAL  2.  Pt will improve FGA to at least a 18/30 in order to demo decr fall risk.  Baseline: 14/30 Goal status: INITIAL  3.  Pt will improve condition 2 of mCTSIB to at least 30 seconds to demo improved balance with EC. Baseline: 12.4 Sec Goal status: INITIAL  4.  Pt will improve condition 4 of mCTSIB to at least 20 seconds to demo improved vestibular input for balance.  Baseline: 7.8 Sec Goal status: INITIAL    ASSESSMENT:  CLINICAL IMPRESSION: Today's skilled session focused on balance tasks on unlevel surfaces, SLS, and stepping strategies. Pt reporting feeling nauseous today, but still wanted to participate in today's session instead of rescheduling. Pt needing frequent seated rest breaks due to fatigue and to use 2L of O2. Without O2, pt's sats dropped down to 93%, but able to incr back to 96% with seated rest break. Will continue per POC.    OBJECTIVE IMPAIRMENTS: Abnormal gait, cardiopulmonary status limiting activity, decreased activity tolerance, decreased balance, decreased endurance, decreased mobility, difficulty walking, dizziness, and postural dysfunction.   ACTIVITY LIMITATIONS: bending, standing, stairs, transfers, and locomotion level  PARTICIPATION LIMITATIONS: community activity and yard work  PERSONAL FACTORS: Age, Behavior pattern, Past/current  experiences, Time since onset of injury/illness/exacerbation, and 3+ comorbidities: coronary artery disease, A-fib, aortic valve stenosis, status post coronary artery bypass graft and pacemaker placement, hx of severe anemia, COPD, diabetes mellitus, HTN, HLD, hx of MI in 2006  are also affecting patient's functional outcome.   REHAB POTENTIAL: Good  CLINICAL DECISION MAKING: Evolving/moderate complexity  EVALUATION COMPLEXITY:  Moderate   PLAN:  PT FREQUENCY: 1x/week  PT DURATION: 8 weeks - anticipate 4 weeks   PLANNED INTERVENTIONS: [id number]- PT Re-evaluation, 97110-Therapeutic exercises, 97530- Therapeutic activity, 97112- Neuromuscular re-education, 97535- Self Care, [id number]- Manual therapy, [phone]- Gait training, [phone]- Canalith repositioning, Patient/Family education, Balance training, Vestibular training, and DME instructions  PLAN FOR NEXT SESSION: work on balance with head motions, SLS, EC balance on level ground and foam. Will need to check goals, add any more visits?    [place]ron [place], PT, DPT 03/25/2024, 2:07 PM

## 2024-03-26 MED ORDER — REVEFENACIN 175 MCG/3ML IN SOLN: 175.0000 ug | Freq: Every day | RESPIRATORY_TRACT | 5 refills | Status: AC

## 2024-03-26 NOTE — Telephone Encounter (Signed)
 Called and spoke with patient, advised that the Yupelri  nebulizer solution has been sent to [facility].  Nothing further needed.

## 2024-04-01 ENCOUNTER — Ambulatory Visit: Admitting: Physical Therapy

## 2024-04-01 ENCOUNTER — Encounter: Payer: Self-pay | Admitting: Physical Therapy

## 2024-04-01 VITALS — BP 117/66 | HR 80

## 2024-04-01 DIAGNOSIS — Z9181 History of falling: Secondary | ICD-10-CM

## 2024-04-01 DIAGNOSIS — R42 Dizziness and giddiness: Secondary | ICD-10-CM | POA: Diagnosis not present

## 2024-04-01 DIAGNOSIS — R2681 Unsteadiness on feet: Secondary | ICD-10-CM

## 2024-04-01 NOTE — Therapy (Signed)
 OUTPATIENT PHYSICAL THERAPY VESTIBULAR TREATMENT    Patient Name: Jeremiah [name] MRN: [id number] DOB:1946/03/04, 78 y.o., male Today's Date: 04/01/2024  END OF SESSION:  PT End of Session - 04/01/24 1318     Visit Number 5    Number of Visits 9    Date for PT Re-Evaluation 05/05/24    Authorization Type [employer]    PT Start Time 1317    PT Stop Time 1357    PT Time Calculation (min) 40 min    Equipment Utilized During Treatment Oxygen ;Gait belt   2L O2   Activity Tolerance Patient tolerated treatment well;Patient limited by fatigue   limited due to decr O2   Behavior During Therapy WFL for tasks assessed/performed             Past Medical History:  Diagnosis Date   A-fib (HCC)    Acute on chronic systolic (congestive) heart failure (HCC) 06/10/2017   Acute pulmonary edema (HCC)    Anemia    Aortic valve sclerosis 12/2018   Noted on ECHO   Arthritis    CAD (coronary artery disease)    a. 2006: CABG in 2006 with LIMA-LAD, SVG-OM1, and SVG-RPDA   Cardiomegaly 12/2018   Stable, noted on CXR   Carpal tunnel syndrome    Right   Chronic pain 03/21/2016   Colon cancer (HCC) 2006   COPD (chronic obstructive pulmonary disease) (HCC)    Diabetes mellitus without complication (HCC)    DVT (deep venous thrombosis) (HCC)    Right   Essential hypertension 03/21/2016   GERD (gastroesophageal reflux disease)    History of blood transfusion    History of Clostridioides difficile infection    History of prostate cancer 03/21/2016   History of PSVT (paroxysmal supraventricular tachycardia)    HLD (hyperlipidemia)    HTN (hypertension)    Hx of CABG 2006   Hypercholesteremia 03/21/2016   Hypothyroidism    LAE (left atrial enlargement) 12/2018   Severe, Noted on ECHO   LVH (left ventricular hypertrophy) 12/2018   Mild, Noted on ECHO   Medication management 03/21/2016   Mitral annular calcification 12/2018   with mild MS, Noted on ECHO   Morbid obesity (HCC)  03/21/2016   Myocardial infarct (HCC)    OSA (obstructive sleep apnea) 03/21/2016   uses oxygen  at night time   Pain in right ankle and joints of right foot 03/21/2016   Paronychia 03/21/2016   Phimosis    Pneumonia    Primary insomnia 03/21/2016   Prostate cancer (HCC) 2008   Pulmonary hypertension (HCC) 12/2018   Moderate, Noted on ECHO   Tobacco dependence 03/21/2016   Tricuspid regurgitation 12/2018   Mild, Noted on ECHO   Past Surgical History:  Procedure Laterality Date   ANKLE SURGERY Right 12/2013   CARDIAC CATHETERIZATION N/A 05/08/2016   Procedure: Left Heart Cath and Cors/Grafts Angiography;  Surgeon: Arty Binning, MD;  Location: [facility] INVASIVE CV LAB;  Service: Cardiovascular;  Laterality: N/A;   CIRCUMCISION N/A 10/07/2019   Procedure: CIRCUMCISION ADULT;  Surgeon: Andrez Banker, MD;  Location: [facility] ORS;  Service: Urology;  Laterality: N/A;   CORONARY ARTERY BYPASS GRAFT  2006   x3   ELECTROPHYSIOLOGIC STUDY N/A 08/24/2016   Procedure: SVT Ablation;  Surgeon: Will Cortland Ding, MD;  Location: [facility] INVASIVE CV LAB;  Service: Cardiovascular;  Laterality: N/A;   PACEMAKER IMPLANT N/A 08/23/2023   Procedure: PACEMAKER IMPLANT;  Surgeon: Lei Pump, MD;  Location: [facility]  INVASIVE CV LAB;  Service: Cardiovascular;  Laterality: N/A;   PROSTATE SURGERY  2008   PTCA     UMBILICAL HERNIA REPAIR N/A 05/26/2022   Procedure: PRIMARY REPAIR OF STRANGULATED UMBILICAL HERNIA WITH PARTIAL OMENTECTOMY;  Surgeon: Lockie Rima, MD;  Location: [facility] OR;  Service: General;  Laterality: N/A;   Patient Active Problem List   Diagnosis Date Noted   Dizziness 02/13/2024   Sensorineural hearing loss, bilateral 02/13/2024   Iron  deficiency anemia, unspecified 10/07/2023   S/P placement of cardiac pacemaker 08/24/2023   Complete heart block (HCC) 08/22/2023   Aortic stenosis 08/22/2023   Heart block 08/22/2023   Cardiogenic shock (HCC) 08/22/2023   Peripheral arterial disease (HCC)  08/13/2023   Chronic diastolic (congestive) heart failure (HCC) 06/05/2023   Acute on chronic respiratory failure with hypoxia (HCC) 06/05/2023   Acute on chronic diastolic CHF (congestive heart failure) (HCC) 06/05/2023   Ischemic colitis (HCC) 02/18/2023   Pulmonary hypertension, unspecified (HCC) 02/18/2023   New onset atrial fibrillation (HCC) 02/18/2023   Mitral stenosis 02/18/2023   Iron  deficiency anemia 10/07/2022   Former smoker 08/21/2022   Incarcerated umbilical hernia 05/26/2022   History of supraventricular tachycardia 05/26/2022   Type 2 diabetes mellitus with hyperglycemia, without long-term current use of insulin  (HCC) 04/29/2020   Carotid artery calcification 09/09/2018   Coronary artery disease involving coronary bypass graft of native heart without angina pectoris 07/22/2017   COPD GOLD II with reversibility 04/03/2017   Chronic respiratory failure with hypoxia and hypercapnia (HCC) 04/03/2017   Hyperkalemia 02/26/2017   Prolonged QT interval 02/26/2017   Skin abscess 02/13/2017   Exertional shortness of breath 02/12/2017   Enteritis due to Clostridium difficile 01/05/2017   Acute GI bleeding 01/03/2017   Symptomatic anemia 01/03/2017   Hypothyroidism, acquired 01/03/2017   Cough    Diarrhea    AVNRT (AV nodal re-entry tachycardia) (HCC) 08/24/2016   NSTEMI (non-ST elevated myocardial infarction) (HCC) 05/05/2016   SVT (supraventricular tachycardia) (HCC) 03/21/2016   S/P CABG x 3 03/21/2016   Morbid obesity due to excess calories (HCC) complicated by hbp/hyperlipidemia  03/21/2016   Essential hypertension 03/21/2016   Hyperlipidemia LDL goal <70 03/21/2016   Paronychia 03/21/2016   Tobacco dependence 03/21/2016   Pain in right ankle and joints of right foot 03/21/2016   Chronic pain 03/21/2016   OSA (obstructive sleep apnea) 03/21/2016   Medication management 03/21/2016   History of prostate cancer 03/21/2016   Primary insomnia 03/21/2016   Post-traumatic  osteoarthritis of right ankle 11/07/2015   Closed displaced fracture of medial malleolus of right tibia 10/24/2015    PCP: Lanae Pinal, MD  REFERRING PROVIDER: Reynold Caves, MD   REFERRING DIAG: R42 (ICD-10-CM) - Dizziness   THERAPY DIAG:  Dizziness and giddiness  Unsteadiness on feet  History of falling  ONSET DATE: 03/03/2024  Rationale for Evaluation and Treatment: Rehabilitation  SUBJECTIVE:   SUBJECTIVE STATEMENT: Feeling better today. No falls. Still feels like he is walking like a duck. Didn't do any of his exercises this week.   Pt accompanied by: self  PERTINENT HISTORY: PMH: coronary artery disease, A-fib, aortic valve stenosis, status post coronary artery bypass graft and pacemaker placement, hx of severe anemia, COPD, diabetes mellitus, HTN, HLD, hx of MI in 2006  Pt currently awaiting heart valve surgery   Per Dr. Darlin Ehrlich: The patient describes his dizziness as a spinning vertigo that lasts for minutes. In between episodes, he also complains of lightheaded and off-balance sensation.   PAIN:  Are you having pain? No  Vitals:   04/01/24 1323  BP: 117/66  Pulse: 80      PRECAUTIONS: Fall and ICD/Pacemaker  FALLS: Has patient fallen in last 6 months? Yes. Number of falls 2  LIVING ENVIRONMENT:  Has following equipment at home: Walker - 2 wheeled, does not use a walker, when his vertigo was really bad, needed to use it for a couple days  PLOF: Independent  PATIENT GOALS: Wants to be able to walk straight   OBJECTIVE:  Note: Objective measures were completed at Evaluation unless otherwise noted.  DIAGNOSTIC FINDINGS: 12/19/23:  CT Angio Head/Neck IMPRESSION: 1. No emergent large vessel occlusion. Widespread cerebrovascular atherosclerosis. 2. Severe stenosis of the left ICA distal cavernous segment and moderate right ICA cavernous segment stenosis. 3. Moderate stenosis of both vertebral artery V4 segments. 4. A 50% stenosis of the proximal right  ICA.  COGNITION: Overall cognitive status: Within functional limits for tasks assessed   POSTURE:  rounded shoulders   TRANSFERS: Assistive device utilized: None  Sit to stand: Modified independence Stand to sit: Modified independence Pt reports lightheadedness in standing when moving too quickly   GAIT: Gait pattern: decreased stride length and wide BOS Distance walked: Clinic distances  Assistive device utilized: None Level of assistance: Modified independence Comments: Walks with R toe out due to an old injury back in 2014   VESTIBULAR ASSESSMENT:  GENERAL OBSERVATION: Ambulates in independently with 2L of O2   SYMPTOM BEHAVIOR:  Subjective history: See above.   Non-Vestibular symptoms:  N/A  Type of dizziness: Imbalance (Disequilibrium) and Lightheadedness/Faint  Frequency: Daily   Duration: Maybe about 5 minutes, will sit down and start using his oxygen    Aggravating factors: Induced by position change: sit to stand and getting up quickly , will have to use the scooter at [employer] - unsure if it is because of a busier environment or doing more walking   Relieving factors:  Going to sleep or sitting down and putting his oxygen  on   Progression of symptoms: Initially got better, then has stayed the same  OCULOMOTOR EXAM:  Ocular Alignment: normal  Ocular ROM: No Limitations  Spontaneous Nystagmus: absent  Gaze-Induced Nystagmus: absent  Smooth Pursuits: intact and jumpy at times   Saccades: intact   VESTIBULAR - OCULAR REFLEX:   Slow VOR: Normal  VOR Cancellation: Normal  Head-Impulse Test: Pt unable to relax neck, so unable to assess                                                                                                                                 TREATMENT DATE: 04/01/24  Therapeutic Activity:   Vitals:   04/01/24 1323  BP: 117/66  Pulse: 80     M-CTSIB  Condition 1: Firm Surface, EO 30 Sec, Normal Sway  Condition 2: Firm Surface, EC 30  Sec, Mild Sway   Condition 3: Foam Surface, EO 30 Sec, Mild  Sway  Condition 4: Foam Surface, EC 19.7 seconds       [facility] PT Assessment - 04/01/24 1333       Functional Gait  Assessment   Gait assessed  Yes    Gait Level Surface Walks 20 ft, slow speed, abnormal gait pattern, evidence for imbalance or deviates 10-15 in outside of the 12 in walkway width. Requires more than 7 sec to ambulate 20 ft.   8.4   Change in Gait Speed Able to change speed, demonstrates mild gait deviations, deviates 6-10 in outside of the 12 in walkway width, or no gait deviations, unable to achieve a major change in velocity, or uses a change in velocity, or uses an assistive device.    Gait with Horizontal Head Turns Performs head turns smoothly with slight change in gait velocity (eg, minor disruption to smooth gait path), deviates 6-10 in outside 12 in walkway width, or uses an assistive device.    Gait with Vertical Head Turns Performs task with slight change in gait velocity (eg, minor disruption to smooth gait path), deviates 6 - 10 in outside 12 in walkway width or uses assistive device    Gait and Pivot Turn Pivot turns safely within 3 sec and stops quickly with no loss of balance.    Step Over Obstacle Is able to step over 2 stacked shoe boxes taped together (9 in total height) without changing gait speed. No evidence of imbalance.    Gait with Narrow Base of Support Ambulates less than 4 steps heel to toe or cannot perform without assistance.    Gait with Eyes Closed Walks 20 ft, slow speed, abnormal gait pattern, evidence for imbalance, deviates 10-15 in outside 12 in walkway width. Requires more than 9 sec to ambulate 20 ft.   10.7 seconds with veering   Ambulating Backwards Walks 20 ft, slow speed, abnormal gait pattern, evidence for imbalance, deviates 10-15 in outside 12 in walkway width.   21 seconds   Steps Alternating feet, must use rail.    Total Score 17    FGA comment: 17/30 = High Fall Risk             Pt needing intermittent seated rest breaks during session. O2 after seated rest breaks: 95-96%   Retro gait 4 x 20' with vertical ball toss for coordination, visual scanning, dynamic balance, CGA needed for balance, afterwards O2 at 92% and HR at 120 bpm, with pt needing to put 2L of O2 on, afterwards at 96%  PATIENT EDUCATION: Education details: Results of goals, will continue for 1x week for 4 weeks, continue HEP  Person educated: Patient Education method: Explanation and Verbal cues Education comprehension: verbalized understanding and returned demonstration  HOME EXERCISE PROGRAM: Access Code: [email or web address] URL: [email or web address] Date: 03/18/2024 Prepared by: Jonathan Neighbor  Exercises - Wide Stance with Eyes Closed on Foam Pad  - 2 x daily - 5 x weekly - 3 sets - 30 hold - Romberg Stance on Foam Pad with Head Rotation  - 2 x daily - 5 x weekly - 2 sets - 10 reps - Tandem Walking with Counter Support  - 2 x daily - 5 x weekly - 3 sets - Standing Marching  - 2 x daily - 5 x weekly - 3 sets - Walking with Head Rotation  - 1-2 x daily - 5 x weekly - 3 sets and head nods   GOALS: Goals reviewed with patient? Yes  SHORT TERM GOALS: ALL STGS = LTGS  LONG TERM GOALS: Target date: 04/03/2024  Pt will be independent with final HEP for balance in order to build upon functional gains made in therapy. Baseline:  Goal status: IN PROGRESS  2.  Pt will improve FGA to at least a 18/30 in order to demo decr fall risk.  Baseline: 14/30; 17/30 on 04/01/24 Goal status: NOT MET  3.  Pt will improve condition 2 of mCTSIB to at least 30 seconds to demo improved balance with EC. Baseline: 12.4 Sec  30 seconds (4/30) Goal status: MET  4.  Pt will improve condition 4 of mCTSIB to at least 20 seconds to demo improved vestibular input for balance.  Baseline: 7.8 Sec  19.7 seconds (4/30) Goal status: MET  UPDATED/ONGOING LTGS FOR 8 WEEK POC:  LONG TERM GOALS: Target  date: 05/05/24  Pt will be independent with final HEP for balance in order to build upon functional gains made in therapy. Baseline:  Goal status: IN PROGRESS  2.  Pt will improve FGA to at least a 21/30 in order to demo decr fall risk.  Baseline: 14/30; 17/30 on 04/01/24 Goal status: REVISED   4.  Pt will improve condition 4 of mCTSIB to at least 25 seconds to demo improved vestibular input for balance.  Baseline: 7.8 Sec  19.7 seconds (4/30) Goal status: REVISED    ASSESSMENT:  CLINICAL IMPRESSION: Today's skilled session focused on assessing LTGs. Pt has met 2 out of 4 LTGs in regards to improving conditions 2 and 4 on mCTSIB. Pt able to hold condition 2 for 30 seconds and condition 4 for 19.7 seconds. Pt did not meet LTG #2 in regards to FGA, pt scored a 17/30 (indicating a high fall risk, previously was 14/30). Pt's HEP goal is on-going. Pt needing intermittent seated rest breaks due to fatigue and using 2L of O2. Pt does have multiple cardiovascular disorders that contribute to pt's imbalance. Pt will continue to benefit from skilled PT to address balance and decr fall risk. LTGs updated/revised as appropriate. Will continue per POC.    OBJECTIVE IMPAIRMENTS: Abnormal gait, cardiopulmonary status limiting activity, decreased activity tolerance, decreased balance, decreased endurance, decreased mobility, difficulty walking, dizziness, and postural dysfunction.   ACTIVITY LIMITATIONS: bending, standing, stairs, transfers, and locomotion level  PARTICIPATION LIMITATIONS: community activity and yard work  PERSONAL FACTORS: Age, Behavior pattern, Past/current experiences, Time since onset of injury/illness/exacerbation, and 3+ comorbidities: coronary artery disease, A-fib, aortic valve stenosis, status post coronary artery bypass graft and pacemaker placement, hx of severe anemia, COPD, diabetes mellitus, HTN, HLD, hx of MI in 2006  are also affecting patient's functional outcome.    REHAB POTENTIAL: Good  CLINICAL DECISION MAKING: Evolving/moderate complexity  EVALUATION COMPLEXITY: Moderate   PLAN:  PT FREQUENCY: 1x/week  PT DURATION: 8 weeks - anticipate 4 weeks   PLANNED INTERVENTIONS: 97164- PT Re-evaluation, 97110-Therapeutic exercises, 97530- Therapeutic activity, [id number]- Neuromuscular re-education, 97535- Self Care, [id number]- Manual therapy, [phone]- Gait training, [phone]- Canalith repositioning, Patient/Family education, Balance training, Vestibular training, and DME instructions  PLAN FOR NEXT SESSION: work on balance with head motions, SLS, EC balance on level ground and foam.    Seabron Cypress, PT, DPT 04/01/2024, 2:37 PM

## 2024-04-03 NOTE — Progress Notes (Unsigned)
 [facility] OFFICE PROGRESS NOTE  Koirala, Dibas, MD [address] [place] [address]  DIAGNOSIS:  Iron  deficiency anemia secondary to chronic gastrointestinal hemorrhage   PRIOR THERAPY: 1) Iron  infusion with Ferrlecit 250 Mg IV for 4 doses. Last dose was given on 06/09/2023  2) IV iron  infusion with feraheme, last dose on 12/17/23  CURRENT THERAPY: Oral ferrous sulfate  with orange juice as well as oral vitamin B12 1000 mcg p.o. daily and blood transfusions PRN to keep Hbg >8.   INTERVAL HISTORY: Jeremiah [name] 78 y.o. male returns clinic today for follow-up visit.  The patient was last seen by Dr. Marguerita Shih on 03/05/2024.  In summary the patient has a history of anemia secondary to GI blood loss.  He is followed by GI.  He saw Dr. Darolyn Elks at [facility] who arrange for retrograde device assisted endoscopy and they ***.  The patient has had improvement in his anemia since that time??? An area of bleeding was cauterized  The patient receives IV iron  infusions on an as-needed basis most recent being on December 17, 2023.  He did have a prior reaction with significant dizziness requiring ***.   When I last saw the patient I strongly encouraged him to also take a p.o. iron  supplement for maintenance.  He is currently ***and he is compliant with this.  He also takes vitamin B12 and 81 mg aspirin .  Eliquis  was discontinued.  He previously was seeing a vascular provider at [facility] for claudication but he needed to get his anemia stabilized before addressing that problem per patient report.  He is also followed closely by cardiology.  He does have moderate to severe mitral stenosis and they will reach out to ***to see what his options are.  The patient does have a cough.  He also saw pulmonary medicine recently.  He also saw ENT in March 2025 for dizziness.  They recommended vestibular neurodiagnostic testing.  He is expected to start physical therapy for vertigo.  This started  after his iron  infusion.  Overall the patient states his energy is ***.  Active?  Denies any chest pain, nausea, dizziness, shortness of breath, lightheadedness, or dizziness.  He denies any abnormal bleeding or bruising.  He is here today for evaluation and repeat blood work.   MEDICAL HISTORY: Past Medical History:  Diagnosis Date   A-fib [facility])    Acute on chronic systolic (congestive) heart failure (HCC) 06/10/2017   Acute pulmonary edema (HCC)    Anemia    Aortic valve sclerosis 12/2018   Noted on ECHO   Arthritis    CAD (coronary artery disease)    a. 2006: CABG in 2006 with LIMA-LAD, SVG-OM1, and SVG-RPDA   Cardiomegaly 12/2018   Stable, noted on CXR   Carpal tunnel syndrome    Right   Chronic pain 03/21/2016   Colon cancer (HCC) 2006   COPD (chronic obstructive pulmonary disease) (HCC)    Diabetes mellitus without complication (HCC)    DVT (deep venous thrombosis) (HCC)    Right   Essential hypertension 03/21/2016   GERD (gastroesophageal reflux disease)    History of blood transfusion    History of Clostridioides difficile infection    History of prostate cancer 03/21/2016   History of PSVT (paroxysmal supraventricular tachycardia)    HLD (hyperlipidemia)    HTN (hypertension)    Hx of CABG 2006   Hypercholesteremia 03/21/2016   Hypothyroidism    LAE (left atrial enlargement) 12/2018  Severe, Noted on ECHO   LVH (left ventricular hypertrophy) 12/2018   Mild, Noted on ECHO   Medication management 03/21/2016   Mitral annular calcification 12/2018   with mild MS, Noted on ECHO   Morbid obesity (HCC) 03/21/2016   Myocardial infarct (HCC)    OSA (obstructive sleep apnea) 03/21/2016   uses oxygen  at night time   Pain in right ankle and joints of right foot 03/21/2016   Paronychia 03/21/2016   Phimosis    Pneumonia    Primary insomnia 03/21/2016   Prostate cancer (HCC) 2008   Pulmonary hypertension (HCC) 12/2018   Moderate, Noted on ECHO   Tobacco dependence  03/21/2016   Tricuspid regurgitation 12/2018   Mild, Noted on ECHO    ALLERGIES:  is allergic to shellfish allergy and fluzone [influenza virus vaccine].  MEDICATIONS:  Current Outpatient Medications  Medication Sig Dispense Refill   aspirin  EC 81 MG tablet Take 1 tablet (81 mg total) by mouth daily. Swallow whole. 90 tablet 3   atorvastatin  (LIPITOR ) 80 MG tablet Take 80 mg by mouth daily.     benzonatate  (TESSALON ) 200 MG capsule Take 1 capsule (200 mg total) by mouth 3 (three) times daily as needed. 45 capsule 3   budesonide  (PULMICORT ) 0.5 MG/2ML nebulizer solution Take 2 mLs (0.5 mg total) by nebulization 2 (two) times daily. 120 mL 11   cetirizine (ZYRTEC) 10 MG tablet Take 10 mg by mouth daily.     colchicine  0.6 MG tablet Take 0.6 mg by mouth daily as needed (Gout).     dicyclomine  (BENTYL ) 10 MG capsule Take 10 mg by mouth 3 (three) times daily before meals. 30 minutes before eating     ezetimibe  (ZETIA ) 10 MG tablet Take 1 tablet (10 mg total) by mouth daily. 90 tablet 3   ferrous sulfate  325 (65 FE) MG EC tablet Take 325 mg by mouth daily with breakfast.     fluticasone  (FLONASE ) 50 MCG/ACT nasal spray SHAKE LIQUID AND USE 1 SPRAY IN EACH NOSTRIL DAILY (Patient taking differently: Place 1 spray into both nostrils daily.) 16 g 2   formoterol  (PERFOROMIST ) 20 MCG/2ML nebulizer solution Take 2 mLs (20 mcg total) by nebulization 2 (two) times daily. 120 mL 6   furosemide  (LASIX ) 40 MG tablet Take 2 tablets (80 mg total) by mouth daily. (Patient taking differently: Take 80 mg by mouth 2 (two) times daily.) 30 tablet 0   glimepiride (AMARYL) 2 MG tablet Take 4 mg by mouth every morning.     levothyroxine  (SYNTHROID , LEVOTHROID) 88 MCG tablet Take 88 mcg by mouth daily before breakfast. Take on an empty stomach     metFORMIN (GLUCOPHAGE-XR) 500 MG 24 hr tablet Take 2,000 mg by mouth daily.     omeprazole (PRILOSEC) 40 MG capsule Take 40 mg by mouth daily.     Potassium Chloride  ER 20  MEQ TBCR Take 1 tablet by mouth daily.     revefenacin  (YUPELRI ) 175 MCG/3ML nebulizer solution Take 3 mLs (175 mcg total) by nebulization daily. 90 mL 5   spironolactone  (ALDACTONE ) 25 MG tablet Take 1 tablet (25 mg total) by mouth daily. 30 tablet 0   No current facility-administered medications for this visit.    SURGICAL HISTORY:  Past Surgical History:  Procedure Laterality Date   ANKLE SURGERY Right 12/2013   CARDIAC CATHETERIZATION N/A 05/08/2016   Procedure: Left Heart Cath and Cors/Grafts Angiography;  Surgeon: Arty Binning, MD;  Location: [facility] INVASIVE CV LAB;  Service: Cardiovascular;  Laterality: N/A;   CIRCUMCISION N/A 10/07/2019   Procedure: CIRCUMCISION ADULT;  Surgeon: Andrez Banker, MD;  Location: [facility] ORS;  Service: Urology;  Laterality: N/A;   CORONARY ARTERY BYPASS GRAFT  2006   x3   ELECTROPHYSIOLOGIC STUDY N/A 08/24/2016   Procedure: SVT Ablation;  Surgeon: Will Cortland Ding, MD;  Location: [facility] INVASIVE CV LAB;  Service: Cardiovascular;  Laterality: N/A;   PACEMAKER IMPLANT N/A 08/23/2023   Procedure: PACEMAKER IMPLANT;  Surgeon: Lei Pump, MD;  Location: [facility] INVASIVE CV LAB;  Service: Cardiovascular;  Laterality: N/A;   PROSTATE SURGERY  2008   PTCA     UMBILICAL HERNIA REPAIR N/A 05/26/2022   Procedure: PRIMARY REPAIR OF STRANGULATED UMBILICAL HERNIA WITH PARTIAL OMENTECTOMY;  Surgeon: Lockie Rima, MD;  Location: [facility] OR;  Service: General;  Laterality: N/A;    REVIEW OF SYSTEMS:   Review of Systems  Constitutional: Negative for appetite change, chills, fatigue, fever and unexpected weight change.  HENT:   Negative for mouth sores, nosebleeds, sore throat and trouble swallowing.   Eyes: Negative for eye problems and icterus.  Respiratory: Negative for cough, hemoptysis, shortness of breath and wheezing.   Cardiovascular: Negative for chest pain and leg swelling.  Gastrointestinal: Negative for abdominal pain, constipation, diarrhea, nausea and  vomiting.  Genitourinary: Negative for bladder incontinence, difficulty urinating, dysuria, frequency and hematuria.   Musculoskeletal: Negative for back pain, gait problem, neck pain and neck stiffness.  Skin: Negative for itching and rash.  Neurological: Negative for dizziness, extremity weakness, gait problem, headaches, light-headedness and seizures.  Hematological: Negative for adenopathy. Does not bruise/bleed easily.  Psychiatric/Behavioral: Negative for confusion, depression and sleep disturbance. The patient is not nervous/anxious.     PHYSICAL EXAMINATION:  There were no vitals taken for this visit.  ECOG PERFORMANCE STATUS: {CHL ONC ECOG [id number]  Physical Exam  Constitutional: Oriented to person, place, and time and well-developed, well-nourished, and in no distress. No distress.  HENT:  Head: Normocephalic and atraumatic.  Mouth/Throat: Oropharynx is clear and moist. No oropharyngeal exudate.  Eyes: Conjunctivae are normal. Right eye exhibits no discharge. Left eye exhibits no discharge. No scleral icterus.  Neck: Normal range of motion. Neck supple.  Cardiovascular: Normal rate, regular rhythm, normal heart sounds and intact distal pulses.   Pulmonary/Chest: Effort normal and breath sounds normal. No respiratory distress. No wheezes. No rales.  Abdominal: Soft. Bowel sounds are normal. Exhibits no distension and no mass. There is no tenderness.  Musculoskeletal: Normal range of motion. Exhibits no edema.  Lymphadenopathy:    No cervical adenopathy.  Neurological: Alert and oriented to person, place, and time. Exhibits normal muscle tone. Gait normal. Coordination normal.  Skin: Skin is warm and dry. No rash noted. Not diaphoretic. No erythema. No pallor.  Psychiatric: Mood, memory and judgment normal.  Vitals reviewed.  LABORATORY DATA: Lab Results  Component Value Date   WBC 5.2 03/02/2024   HGB 11.7 (L) 03/02/2024   HCT 37.3 (L) 03/02/2024   MCV 90.3  03/02/2024   PLT 187 03/02/2024      Chemistry      Component Value Date/Time   NA 139 02/03/2024 0941   NA 138 11/12/2023 1027   K 4.4 02/03/2024 0941   CL 99 02/03/2024 0941   CO2 32 02/03/2024 0941   BUN 19 02/03/2024 0941   BUN 19 11/12/2023 1027   CREATININE 0.98 02/03/2024 0941      Component Value Date/Time   CALCIUM  9.7 02/03/2024 0941   ALKPHOS  88 02/03/2024 0941   AST 16 02/03/2024 0941   ALT 16 02/03/2024 0941   BILITOT 0.4 02/03/2024 0941       RADIOGRAPHIC STUDIES:  No results found.   ASSESSMENT/PLAN:  This is a very pleasant 78 year old Caucasian male with iron  deficiency anemia secondary to presumed GI blood loss.   He is receiving frequent blood transfusions.  He also receives IV iron  infusions on an as-needed basis the most recent on 12/17/23 with Feraheme. He had some dizziness with this and Dr. Marguerita Shih would consider prednisone  or solumedrol for pre-medications moving forward. ***Switch IV iron ?   Labs were reviewed today.  His hemoglobin is ***. He does not need a blood transfusion today.  He would consider blood transfusion if his hemoglobin were less than 8.     He is able to tell when his hemoglobin is low and knows he is always welcome to call us  sooner for same-day lab appointment.  I will place standing orders for blood bank hold's.   He saw Dr. Darolyn Elks at [facility] who found an area of bleeding which was cauterized.  He was instructed to continue taking his iron  supplement p.o. daily.  We will call him or send him a chart message we have the results of his iron  studies.  Since he had some intolerance with fereheme in the past, should he require IV iron , we can consider adding premedications and changing to venofer .    We will see him back in 6 weeks just to ensure stabilization in his Hbg.  The patient was advised to call immediately if he has any concerning symptoms in the interval. The patient voices understanding of current disease status and  treatment options and is in agreement with the current care plan. All questions were answered. The patient knows to call the clinic with any problems, questions or concerns. We can certainly see the patient much sooner if necessary    No orders of the defined types were placed in this encounter.    I spent {CHL ONC TIME VISIT - EAVWU:[id number]} counseling the patient face to face. The total time spent in the appointment was {CHL ONC TIME VISIT - NFAOZ:[id number]}.  Amilliana Hayworth L Kayline Sheer, PA-C 04/03/24

## 2024-04-06 ENCOUNTER — Inpatient Hospital Stay: Admission: RE | Admit: 2024-04-06 | Source: Ambulatory Visit

## 2024-04-08 ENCOUNTER — Inpatient Hospital Stay: Attending: Physician Assistant

## 2024-04-08 ENCOUNTER — Inpatient Hospital Stay: Admitting: Physician Assistant

## 2024-04-08 VITALS — BP 114/47 | HR 96 | Temp 97.9°F | Resp 16 | Wt 218.8 lb

## 2024-04-08 DIAGNOSIS — D649 Anemia, unspecified: Secondary | ICD-10-CM

## 2024-04-08 DIAGNOSIS — D500 Iron deficiency anemia secondary to blood loss (chronic): Secondary | ICD-10-CM | POA: Insufficient documentation

## 2024-04-08 LAB — SAMPLE TO BLOOD BANK

## 2024-04-08 LAB — CBC WITH DIFFERENTIAL (CANCER CENTER ONLY)
Abs Immature Granulocytes: 0.03 10*3/uL (ref 0.00–0.07)
Basophils Absolute: 0 10*3/uL (ref 0.0–0.1)
Basophils Relative: 1 %
Eosinophils Absolute: 0.2 10*3/uL (ref 0.0–0.5)
Eosinophils Relative: 3 %
HCT: 36.2 % — ABNORMAL LOW (ref 39.0–52.0)
Hemoglobin: 12.1 g/dL — ABNORMAL LOW (ref 13.0–17.0)
Immature Granulocytes: 1 %
Lymphocytes Relative: 28 %
Lymphs Abs: 1.4 10*3/uL (ref 0.7–4.0)
MCH: 30.2 pg (ref 26.0–34.0)
MCHC: 33.4 g/dL (ref 30.0–36.0)
MCV: 90.3 fL (ref 80.0–100.0)
Monocytes Absolute: 0.6 10*3/uL (ref 0.1–1.0)
Monocytes Relative: 12 %
Neutro Abs: 2.8 10*3/uL (ref 1.7–7.7)
Neutrophils Relative %: 55 %
Platelet Count: 179 10*3/uL (ref 150–400)
RBC: 4.01 MIL/uL — ABNORMAL LOW (ref 4.22–5.81)
RDW: 17.8 % — ABNORMAL HIGH (ref 11.5–15.5)
WBC Count: 5 10*3/uL (ref 4.0–10.5)
nRBC: 0 % (ref 0.0–0.2)

## 2024-04-08 LAB — IRON AND IRON BINDING CAPACITY (CC-WL,HP ONLY)
Iron: 71 ug/dL (ref 45–182)
Saturation Ratios: 17 % — ABNORMAL LOW (ref 17.9–39.5)
TIBC: 414 ug/dL (ref 250–450)
UIBC: 343 ug/dL (ref 117–376)

## 2024-04-09 LAB — FERRITIN: Ferritin: 35 ng/mL (ref 24–336)

## 2024-04-13 ENCOUNTER — Ambulatory Visit

## 2024-04-13 NOTE — Progress Notes (Signed)
 Remote pacemaker transmission.

## 2024-04-14 ENCOUNTER — Encounter: Payer: Self-pay | Admitting: Physician Assistant

## 2024-04-22 ENCOUNTER — Ambulatory Visit: Attending: Otolaryngology

## 2024-04-22 VITALS — BP 110/63 | HR 103

## 2024-04-22 DIAGNOSIS — Z9181 History of falling: Secondary | ICD-10-CM | POA: Insufficient documentation

## 2024-04-22 DIAGNOSIS — R2681 Unsteadiness on feet: Secondary | ICD-10-CM | POA: Diagnosis present

## 2024-04-22 DIAGNOSIS — R42 Dizziness and giddiness: Secondary | ICD-10-CM | POA: Diagnosis present

## 2024-04-22 NOTE — Therapy (Signed)
 OUTPATIENT PHYSICAL THERAPY VESTIBULAR TREATMENT    Patient Name: Jeremiah [name] MRN: [id number] DOB:03-Jan-1946, 78 y.o., male Today's Date: 04/22/2024  END OF SESSION:  PT End of Session - 04/22/24 1321     Visit Number 6    Number of Visits 9    Date for PT Re-Evaluation 05/05/24    Authorization Type BL[employer]    PT Start Time 1317    PT Stop Time 1358    PT Time Calculation (min) 41 min    Equipment Utilized During Treatment Oxygen ;Gait belt    Activity Tolerance Patient tolerated treatment well;Patient limited by fatigue    Behavior During Therapy WFL for tasks assessed/performed             Past Medical History:  Diagnosis Date   A-fib (HCC)    Acute on chronic systolic (congestive) heart failure (HCC) 06/10/2017   Acute pulmonary edema (HCC)    Anemia    Aortic valve sclerosis 12/2018   Noted on ECHO   Arthritis    CAD (coronary artery disease)    a. 2006: CABG in 2006 with LIMA-LAD, SVG-OM1, and SVG-RPDA   Cardiomegaly 12/2018   Stable, noted on CXR   Carpal tunnel syndrome    Right   Chronic pain 03/21/2016   Colon cancer (HCC) 2006   COPD (chronic obstructive pulmonary disease) (HCC)    Diabetes mellitus without complication (HCC)    DVT (deep venous thrombosis) (HCC)    Right   Essential hypertension 03/21/2016   GERD (gastroesophageal reflux disease)    History of blood transfusion    History of Clostridioides difficile infection    History of prostate cancer 03/21/2016   History of PSVT (paroxysmal supraventricular tachycardia)    HLD (hyperlipidemia)    HTN (hypertension)    Hx of CABG 2006   Hypercholesteremia 03/21/2016   Hypothyroidism    LAE (left atrial enlargement) 12/2018   Severe, Noted on ECHO   LVH (left ventricular hypertrophy) 12/2018   Mild, Noted on ECHO   Medication management 03/21/2016   Mitral annular calcification 12/2018   with mild MS, Noted on ECHO   Morbid obesity (HCC) 03/21/2016   Myocardial infarct  (HCC)    OSA (obstructive sleep apnea) 03/21/2016   uses oxygen  at night time   Pain in right ankle and joints of right foot 03/21/2016   Paronychia 03/21/2016   Phimosis    Pneumonia    Primary insomnia 03/21/2016   Prostate cancer (HCC) 2008   Pulmonary hypertension (HCC) 12/2018   Moderate, Noted on ECHO   Tobacco dependence 03/21/2016   Tricuspid regurgitation 12/2018   Mild, Noted on ECHO   Past Surgical History:  Procedure Laterality Date   ANKLE SURGERY Right 12/2013   CARDIAC CATHETERIZATION N/A 05/08/2016   Procedure: Left Heart Cath and Cors/Grafts Angiography;  Surgeon: Arty Binning, MD;  Location: [facility] INVASIVE CV LAB;  Service: Cardiovascular;  Laterality: N/A;   CIRCUMCISION N/A 10/07/2019   Procedure: CIRCUMCISION ADULT;  Surgeon: Andrez Banker, MD;  Location: [facility] ORS;  Service: Urology;  Laterality: N/A;   CORONARY ARTERY BYPASS GRAFT  2006   x3   ELECTROPHYSIOLOGIC STUDY N/A 08/24/2016   Procedure: SVT Ablation;  Surgeon: Will Cortland Ding, MD;  Location: [facility] INVASIVE CV LAB;  Service: Cardiovascular;  Laterality: N/A;   PACEMAKER IMPLANT N/A 08/23/2023   Procedure: PACEMAKER IMPLANT;  Surgeon: Lei Pump, MD;  Location: [facility] INVASIVE CV LAB;  Service: Cardiovascular;  Laterality: N/A;  PROSTATE SURGERY  2008   PTCA     UMBILICAL HERNIA REPAIR N/A 05/26/2022   Procedure: PRIMARY REPAIR OF STRANGULATED UMBILICAL HERNIA WITH PARTIAL OMENTECTOMY;  Surgeon: Lockie Rima, MD;  Location: [facility] OR;  Service: General;  Laterality: N/A;   Patient Active Problem List   Diagnosis Date Noted   Dizziness 02/13/2024   Sensorineural hearing loss, bilateral 02/13/2024   Iron  deficiency anemia, unspecified 10/07/2023   S/P placement of cardiac pacemaker 08/24/2023   Complete heart block (HCC) 08/22/2023   Aortic stenosis 08/22/2023   Heart block 08/22/2023   Cardiogenic shock (HCC) 08/22/2023   Peripheral arterial disease (HCC) 08/13/2023   Chronic diastolic  (congestive) heart failure (HCC) 06/05/2023   Acute on chronic respiratory failure with hypoxia (HCC) 06/05/2023   Acute on chronic diastolic CHF (congestive heart failure) (HCC) 06/05/2023   Ischemic colitis (HCC) 02/18/2023   Pulmonary hypertension, unspecified (HCC) 02/18/2023   New onset atrial fibrillation (HCC) 02/18/2023   Mitral stenosis 02/18/2023   Iron  deficiency anemia 10/07/2022   Former smoker 08/21/2022   Incarcerated umbilical hernia 05/26/2022   History of supraventricular tachycardia 05/26/2022   Type 2 diabetes mellitus with hyperglycemia, without long-term current use of insulin  (HCC) 04/29/2020   Carotid artery calcification 09/09/2018   Coronary artery disease involving coronary bypass graft of native heart without angina pectoris 07/22/2017   COPD GOLD II with reversibility 04/03/2017   Chronic respiratory failure with hypoxia and hypercapnia (HCC) 04/03/2017   Hyperkalemia 02/26/2017   Prolonged QT interval 02/26/2017   Skin abscess 02/13/2017   Exertional shortness of breath 02/12/2017   Enteritis due to Clostridium difficile 01/05/2017   Acute GI bleeding 01/03/2017   Symptomatic anemia 01/03/2017   Hypothyroidism, acquired 01/03/2017   Cough    Diarrhea    AVNRT (AV nodal re-entry tachycardia) (HCC) 08/24/2016   NSTEMI (non-ST elevated myocardial infarction) (HCC) 05/05/2016   SVT (supraventricular tachycardia) (HCC) 03/21/2016   S/P CABG x 3 03/21/2016   Morbid obesity due to excess calories (HCC) complicated by hbp/hyperlipidemia  03/21/2016   Essential hypertension 03/21/2016   Hyperlipidemia LDL goal <70 03/21/2016   Paronychia 03/21/2016   Tobacco dependence 03/21/2016   Pain in right ankle and joints of right foot 03/21/2016   Chronic pain 03/21/2016   OSA (obstructive sleep apnea) 03/21/2016   Medication management 03/21/2016   History of prostate cancer 03/21/2016   Primary insomnia 03/21/2016   Post-traumatic osteoarthritis of right ankle  11/07/2015   Closed displaced fracture of medial malleolus of right tibia 10/24/2015    PCP: Lanae Pinal, MD  REFERRING PROVIDER: Reynold Caves, MD   REFERRING DIAG: R42 (ICD-10-CM) - Dizziness   THERAPY DIAG:  Dizziness and giddiness  Unsteadiness on feet  History of falling  ONSET DATE: 03/03/2024  Rationale for Evaluation and Treatment: Rehabilitation  SUBJECTIVE:   SUBJECTIVE STATEMENT: Patient reports doing fair. Planted 20 trees since we last saw him. Denies falls.   Pt accompanied by: self  PERTINENT HISTORY: PMH: coronary artery disease, A-fib, aortic valve stenosis, status post coronary artery bypass graft and pacemaker placement, hx of severe anemia, COPD, diabetes mellitus, HTN, HLD, hx of MI in 2006  Pt currently awaiting heart valve surgery   Per Dr. Darlin Ehrlich: The patient describes his dizziness as a spinning vertigo that lasts for minutes. In between episodes, he also complains of lightheaded and off-balance sensation.   PAIN:  Are you having pain? No  Vitals:   04/22/24 1322  BP: 110/63  Pulse: (!) 103  SpO2: 94%       PRECAUTIONS: Fall and ICD/Pacemaker   PATIENT GOALS: Wants to be able to walk straight   OBJECTIVE:  Note: Objective measures were completed at Evaluation unless otherwise noted.  DIAGNOSTIC FINDINGS: 12/19/23:  CT Angio Head/Neck IMPRESSION: 1. No emergent large vessel occlusion. Widespread cerebrovascular atherosclerosis. 2. Severe stenosis of the left ICA distal cavernous segment and moderate right ICA cavernous segment stenosis. 3. Moderate stenosis of both vertebral artery V4 segments. 4. A 50% stenosis of the proximal right ICA.   VESTIBULAR ASSESSMENT:  GENERAL OBSERVATION: Ambulates in independently with 2L of O2   SYMPTOM BEHAVIOR:  Subjective history: See above.   Non-Vestibular symptoms: N/A  Type of dizziness: Imbalance (Disequilibrium) and Lightheadedness/Faint  Frequency: Daily   Duration: Maybe about 5  minutes, will sit down and start using his oxygen    Aggravating factors: Induced by position change: sit to stand and getting up quickly, will have to use the scooter at [employer] - unsure if it is because of a busier environment or doing more walking   Relieving factors: Going to sleep or sitting down and putting his oxygen  on   Progression of symptoms: Initially got better, then has stayed the same  OCULOMOTOR EXAM:  Ocular Alignment: normal  Ocular ROM: No Limitations  Spontaneous Nystagmus: absent  Gaze-Induced Nystagmus: absent  Smooth Pursuits: intact and jumpy at times   Saccades: intact   VESTIBULAR - OCULAR REFLEX:   Slow VOR: Normal  VOR Cancellation: Normal  Head-Impulse Test: Pt unable to relax neck, so unable to assess                                                                                                                                 TREATMENT DATE: 04/01/24  Therapeutic Activity:  -scifit hills level 2 2x4 mins  -standing on foam in // bars EO, EC, head turns, semi tandem  -rockerboard A/P EO  -anterior/posterior step over U UE support  -sit <> stand + shoulder abd   -> sit <> stand + anterior rock with shoulder abd    PATIENT EDUCATION: Education details: continue HEP Person educated: Patient Education method: Explanation and Verbal cues Education comprehension: verbalized understanding and returned demonstration  HOME EXERCISE PROGRAM: Access Code: [email or web address] URL: [email or web address] Date: 03/18/2024 Prepared by: Jonathan Neighbor  Exercises - Wide Stance with Eyes Closed on Foam Pad  - 2 x daily - 5 x weekly - 3 sets - 30 hold - Romberg Stance on Foam Pad with Head Rotation  - 2 x daily - 5 x weekly - 2 sets - 10 reps - Tandem Walking with Counter Support  - 2 x daily - 5 x weekly - 3 sets - Standing Marching  - 2 x daily - 5 x weekly - 3 sets - Walking with Head Rotation  - 1-2 x daily - 5 x weekly - 3 sets and head nods    GOALS:  Goals reviewed with patient? Yes  SHORT TERM GOALS: ALL STGS = LTGS  LONG TERM GOALS: Target date: 04/03/2024  Pt will be independent with final HEP for balance in order to build upon functional gains made in therapy. Baseline:  Goal status: IN PROGRESS  2.  Pt will improve FGA to at least a 18/30 in order to demo decr fall risk.  Baseline: 14/30; 17/30 on 04/01/24 Goal status: NOT MET  3.  Pt will improve condition 2 of mCTSIB to at least 30 seconds to demo improved balance with EC. Baseline: 12.4 Sec  30 seconds (4/30) Goal status: MET  4.  Pt will improve condition 4 of mCTSIB to at least 20 seconds to demo improved vestibular input for balance.  Baseline: 7.8 Sec  19.7 seconds (4/30) Goal status: MET  UPDATED/ONGOING LTGS FOR 8 WEEK POC:  LONG TERM GOALS: Target date: 05/05/24  Pt will be independent with final HEP for balance in order to build upon functional gains made in therapy. Baseline:  Goal status: IN PROGRESS  2.  Pt will improve FGA to at least a 21/30 in order to demo decr fall risk.  Baseline: 14/30; 17/30 on 04/01/24 Goal status: REVISED   4.  Pt will improve condition 4 of mCTSIB to at least 25 seconds to demo improved vestibular input for balance.  Baseline: 7.8 Sec  19.7 seconds (4/30) Goal status: REVISED    ASSESSMENT:  CLINICAL IMPRESSION: Patient seen for skilled PT session with emphasis on progressing balance retraining. Remains limited by onset of B LE pain requiring seated rest break and supplemental O2. O2 saturation did not drop below 92% during session. Demonstrates greater LOB posteriorly with delayed reaction noted. Would benefit from further balance challenges and practice with regaining balance. Continue POC.    OBJECTIVE IMPAIRMENTS: Abnormal gait, cardiopulmonary status limiting activity, decreased activity tolerance, decreased balance, decreased endurance, decreased mobility, difficulty walking, dizziness, and postural  dysfunction.   ACTIVITY LIMITATIONS: bending, standing, stairs, transfers, and locomotion level  PARTICIPATION LIMITATIONS: community activity and yard work  PERSONAL FACTORS: Age, Behavior pattern, Past/current experiences, Time since onset of injury/illness/exacerbation, and 3+ comorbidities: coronary artery disease, A-fib, aortic valve stenosis, status post coronary artery bypass graft and pacemaker placement, hx of severe anemia, COPD, diabetes mellitus, HTN, HLD, hx of MI in 2006 are also affecting patient's functional outcome.   REHAB POTENTIAL: Good  CLINICAL DECISION MAKING: Evolving/moderate complexity  EVALUATION COMPLEXITY: Moderate   PLAN:  PT FREQUENCY: 1x/week  PT DURATION: 8 weeks - anticipate 4 weeks   PLANNED INTERVENTIONS: [id number]- PT Re-evaluation, 97110-Therapeutic exercises, 97530- Therapeutic activity, 97112- Neuromuscular re-education, 97535- Self Care, [id number]- Manual therapy, [phone]- Gait training, [phone]- Canalith repositioning, Patient/Family education, Balance training, Vestibular training, and DME instructions  PLAN FOR NEXT SESSION: work on balance with head motions, SLS, EC balance on level ground and foam.    Rebecca Campus, PT Rebecca Campus, PT, DPT, CBIS  04/22/2024, 2:42 PM

## 2024-04-23 ENCOUNTER — Ambulatory Visit
Admission: RE | Admit: 2024-04-23 | Discharge: 2024-04-23 | Disposition: A | Discharge status: Home / Self Care | Source: Ambulatory Visit | Attending: Acute Care | Admitting: Acute Care

## 2024-04-23 DIAGNOSIS — Z122 Encounter for screening for malignant neoplasm of respiratory organs: Secondary | ICD-10-CM

## 2024-04-23 DIAGNOSIS — Z87891 Personal history of nicotine dependence: Secondary | ICD-10-CM

## 2024-04-29 ENCOUNTER — Ambulatory Visit: Admitting: Physical Therapy

## 2024-05-01 ENCOUNTER — Ambulatory Visit

## 2024-05-01 VITALS — BP 111/70 | HR 96

## 2024-05-01 DIAGNOSIS — Z9181 History of falling: Secondary | ICD-10-CM

## 2024-05-01 DIAGNOSIS — R42 Dizziness and giddiness: Secondary | ICD-10-CM | POA: Diagnosis not present

## 2024-05-01 DIAGNOSIS — R2681 Unsteadiness on feet: Secondary | ICD-10-CM

## 2024-05-01 NOTE — Therapy (Signed)
 OUTPATIENT PHYSICAL THERAPY VESTIBULAR TREATMENT    Patient Name: Jeremiah [name] MRN: [id number] DOB:1946-09-01, 78 y.o., male Today's Date: 05/01/2024  END OF SESSION:  PT End of Session - 05/01/24 1150     Visit Number 7    Number of Visits 9    Date for PT Re-Evaluation 05/05/24    Authorization Type BLUE [employer]    PT Start Time 1148   patient late   PT Stop Time 1231    PT Time Calculation (min) 43 min    Equipment Utilized During Treatment Oxygen ;Gait belt    Activity Tolerance Patient tolerated treatment well;Patient limited by fatigue    Behavior During Therapy WFL for tasks assessed/performed             Past Medical History:  Diagnosis Date   A-fib (HCC)    Acute on chronic systolic (congestive) heart failure (HCC) 06/10/2017   Acute pulmonary edema (HCC)    Anemia    Aortic valve sclerosis 12/2018   Noted on ECHO   Arthritis    CAD (coronary artery disease)    a. 2006: CABG in 2006 with LIMA-LAD, SVG-OM1, and SVG-RPDA   Cardiomegaly 12/2018   Stable, noted on CXR   Carpal tunnel syndrome    Right   Chronic pain 03/21/2016   Colon cancer (HCC) 2006   COPD (chronic obstructive pulmonary disease) (HCC)    Diabetes mellitus without complication (HCC)    DVT (deep venous thrombosis) (HCC)    Right   Essential hypertension 03/21/2016   GERD (gastroesophageal reflux disease)    History of blood transfusion    History of Clostridioides difficile infection    History of prostate cancer 03/21/2016   History of PSVT (paroxysmal supraventricular tachycardia)    HLD (hyperlipidemia)    HTN (hypertension)    Hx of CABG 2006   Hypercholesteremia 03/21/2016   Hypothyroidism    LAE (left atrial enlargement) 12/2018   Severe, Noted on ECHO   LVH (left ventricular hypertrophy) 12/2018   Mild, Noted on ECHO   Medication management 03/21/2016   Mitral annular calcification 12/2018   with mild MS, Noted on ECHO   Morbid obesity (HCC) 03/21/2016    Myocardial infarct (HCC)    OSA (obstructive sleep apnea) 03/21/2016   uses oxygen  at night time   Pain in right ankle and joints of right foot 03/21/2016   Paronychia 03/21/2016   Phimosis    Pneumonia    Primary insomnia 03/21/2016   Prostate cancer (HCC) 2008   Pulmonary hypertension (HCC) 12/2018   Moderate, Noted on ECHO   Tobacco dependence 03/21/2016   Tricuspid regurgitation 12/2018   Mild, Noted on ECHO   Past Surgical History:  Procedure Laterality Date   ANKLE SURGERY Right 12/2013   CARDIAC CATHETERIZATION N/A 05/08/2016   Procedure: Left Heart Cath and Cors/Grafts Angiography;  Surgeon: Arty Binning, MD;  Location: [facility] INVASIVE CV LAB;  Service: Cardiovascular;  Laterality: N/A;   CIRCUMCISION N/A 10/07/2019   Procedure: CIRCUMCISION ADULT;  Surgeon: Andrez Banker, MD;  Location: [facility] ORS;  Service: Urology;  Laterality: N/A;   CORONARY ARTERY BYPASS GRAFT  2006   x3   ELECTROPHYSIOLOGIC STUDY N/A 08/24/2016   Procedure: SVT Ablation;  Surgeon: Will Cortland Ding, MD;  Location: [facility] INVASIVE CV LAB;  Service: Cardiovascular;  Laterality: N/A;   PACEMAKER IMPLANT N/A 08/23/2023   Procedure: PACEMAKER IMPLANT;  Surgeon: Lei Pump, MD;  Location: [facility] INVASIVE CV LAB;  Service: Cardiovascular;  Laterality: N/A;   PROSTATE SURGERY  2008   PTCA     UMBILICAL HERNIA REPAIR N/A 05/26/2022   Procedure: PRIMARY REPAIR OF STRANGULATED UMBILICAL HERNIA WITH PARTIAL OMENTECTOMY;  Surgeon: Lockie Rima, MD;  Location: [facility] OR;  Service: General;  Laterality: N/A;   Patient Active Problem List   Diagnosis Date Noted   Dizziness 02/13/2024   Sensorineural hearing loss, bilateral 02/13/2024   Iron  deficiency anemia, unspecified 10/07/2023   S/P placement of cardiac pacemaker 08/24/2023   Complete heart block (HCC) 08/22/2023   Aortic stenosis 08/22/2023   Heart block 08/22/2023   Cardiogenic shock (HCC) 08/22/2023   Peripheral arterial disease (HCC) 08/13/2023   Chronic  diastolic (congestive) heart failure (HCC) 06/05/2023   Acute on chronic respiratory failure with hypoxia (HCC) 06/05/2023   Acute on chronic diastolic CHF (congestive heart failure) (HCC) 06/05/2023   Ischemic colitis (HCC) 02/18/2023   Pulmonary hypertension, unspecified (HCC) 02/18/2023   New onset atrial fibrillation (HCC) 02/18/2023   Mitral stenosis 02/18/2023   Iron  deficiency anemia 10/07/2022   Former smoker 08/21/2022   Incarcerated umbilical hernia 05/26/2022   History of supraventricular tachycardia 05/26/2022   Type 2 diabetes mellitus with hyperglycemia, without long-term current use of insulin  (HCC) 04/29/2020   Carotid artery calcification 09/09/2018   Coronary artery disease involving coronary bypass graft of native heart without angina pectoris 07/22/2017   COPD GOLD II with reversibility 04/03/2017   Chronic respiratory failure with hypoxia and hypercapnia (HCC) 04/03/2017   Hyperkalemia 02/26/2017   Prolonged QT interval 02/26/2017   Skin abscess 02/13/2017   Exertional shortness of breath 02/12/2017   Enteritis due to Clostridium difficile 01/05/2017   Acute GI bleeding 01/03/2017   Symptomatic anemia 01/03/2017   Hypothyroidism, acquired 01/03/2017   Cough    Diarrhea    AVNRT (AV nodal re-entry tachycardia) (HCC) 08/24/2016   NSTEMI (non-ST elevated myocardial infarction) (HCC) 05/05/2016   SVT (supraventricular tachycardia) (HCC) 03/21/2016   S/P CABG x 3 03/21/2016   Morbid obesity due to excess calories (HCC) complicated by hbp/hyperlipidemia  03/21/2016   Essential hypertension 03/21/2016   Hyperlipidemia LDL goal <70 03/21/2016   Paronychia 03/21/2016   Tobacco dependence 03/21/2016   Pain in right ankle and joints of right foot 03/21/2016   Chronic pain 03/21/2016   OSA (obstructive sleep apnea) 03/21/2016   Medication management 03/21/2016   History of prostate cancer 03/21/2016   Primary insomnia 03/21/2016   Post-traumatic osteoarthritis of  right ankle 11/07/2015   Closed displaced fracture of medial malleolus of right tibia 10/24/2015    PCP: Lanae Pinal, MD  REFERRING PROVIDER: Reynold Caves, MD   REFERRING DIAG: R42 (ICD-10-CM) - Dizziness   THERAPY DIAG:  Dizziness and giddiness  Unsteadiness on feet  History of falling  ONSET DATE: 03/03/2024  Rationale for Evaluation and Treatment: Rehabilitation  SUBJECTIVE:   SUBJECTIVE STATEMENT: Patient reports doing well. Denies falls.   Pt accompanied by: self  PERTINENT HISTORY: PMH: coronary artery disease, A-fib, aortic valve stenosis, status post coronary artery bypass graft and pacemaker placement, hx of severe anemia, COPD, diabetes mellitus, HTN, HLD, hx of MI in 2006  Pt currently awaiting heart valve surgery   Per Dr. Darlin Ehrlich: The patient describes his dizziness as a spinning vertigo that lasts for minutes. In between episodes, he also complains of lightheaded and off-balance sensation.   PAIN:  Are you having pain? No  Vitals:   05/01/24 1153  BP: 111/70  Pulse: 96  SpO2: 96%  PRECAUTIONS: Fall and ICD/Pacemaker   PATIENT GOALS: Wants to be able to walk straight   OBJECTIVE:  Note: Objective measures were completed at Evaluation unless otherwise noted.  DIAGNOSTIC FINDINGS: 12/19/23:  CT Angio Head/Neck IMPRESSION: 1. No emergent large vessel occlusion. Widespread cerebrovascular atherosclerosis. 2. Severe stenosis of the left ICA distal cavernous segment and moderate right ICA cavernous segment stenosis. 3. Moderate stenosis of both vertebral artery V4 segments. 4. A 50% stenosis of the proximal right ICA.                                                                                                                            TREATMENT   Therapeutic Activity:  -scifit hills level 2 2x5 mins   94% O2, 104BPM -alternating steps to 6" step from airex in // bars   -initially B UE support progressing to intermittent UE support    -2x45s (seated rest break needed afterward) -standing on airex NBOS EO/EC/ EO + head turns  -semi-tandem EO    PATIENT EDUCATION: Education details: continue HEP Person educated: Patient Education method: Explanation and Verbal cues Education comprehension: verbalized understanding and returned demonstration  HOME EXERCISE PROGRAM: Access Code: [email or web address] URL: [email or web address] Date: 03/18/2024 Prepared by: Jonathan Neighbor  Exercises - Wide Stance with Eyes Closed on Foam Pad  - 2 x daily - 5 x weekly - 3 sets - 30 hold - Romberg Stance on Foam Pad with Head Rotation  - 2 x daily - 5 x weekly - 2 sets - 10 reps - Tandem Walking with Counter Support  - 2 x daily - 5 x weekly - 3 sets - Standing Marching  - 2 x daily - 5 x weekly - 3 sets - Walking with Head Rotation  - 1-2 x daily - 5 x weekly - 3 sets and head nods   GOALS: Goals reviewed with patient? Yes  SHORT TERM GOALS: ALL STGS = LTGS  LONG TERM GOALS: Target date: 04/03/2024  Pt will be independent with final HEP for balance in order to build upon functional gains made in therapy. Baseline:  Goal status: IN PROGRESS  2.  Pt will improve FGA to at least a 18/30 in order to demo decr fall risk.  Baseline: 14/30; 17/30 on 04/01/24 Goal status: NOT MET  3.  Pt will improve condition 2 of mCTSIB to at least 30 seconds to demo improved balance with EC. Baseline: 12.4 Sec  30 seconds (4/30) Goal status: MET  4.  Pt will improve condition 4 of mCTSIB to at least 20 seconds to demo improved vestibular input for balance.  Baseline: 7.8 Sec  19.7 seconds (4/30) Goal status: MET  UPDATED/ONGOING LTGS FOR 8 WEEK POC:  LONG TERM GOALS: Target date: 05/05/24  Pt will be independent with final HEP for balance in order to build upon functional gains made in therapy. Baseline:  Goal status: IN PROGRESS  2.  Pt will improve FGA to at least a  21/30 in order to demo decr fall risk.  Baseline: 14/30; 17/30  on 04/01/24 Goal status: REVISED   4.  Pt will improve condition 4 of mCTSIB to at least 25 seconds to demo improved vestibular input for balance.  Baseline: 7.8 Sec  19.7 seconds (4/30) Goal status: REVISED    ASSESSMENT:  CLINICAL IMPRESSION: Patient seen for skilled PT session with emphasis on endurance and dynamic balance. He remains limited by onset of B LE pain with prolonged standing and/or movement. This is resolved by additional of supplemental O2, though oxygen  levels >92% throughout session. His static standing balance on compliant surface with EC is improving with appropriate ankle strategy noted in order to maintain balance. Continue POC.    OBJECTIVE IMPAIRMENTS: Abnormal gait, cardiopulmonary status limiting activity, decreased activity tolerance, decreased balance, decreased endurance, decreased mobility, difficulty walking, dizziness, and postural dysfunction.   ACTIVITY LIMITATIONS: bending, standing, stairs, transfers, and locomotion level  PARTICIPATION LIMITATIONS: community activity and yard work  PERSONAL FACTORS: Age, Behavior pattern, Past/current experiences, Time since onset of injury/illness/exacerbation, and 3+ comorbidities: coronary artery disease, A-fib, aortic valve stenosis, status post coronary artery bypass graft and pacemaker placement, hx of severe anemia, COPD, diabetes mellitus, HTN, HLD, hx of MI in 2006 are also affecting patient's functional outcome.   REHAB POTENTIAL: Good  CLINICAL DECISION MAKING: Evolving/moderate complexity  EVALUATION COMPLEXITY: Moderate   PLAN:  PT FREQUENCY: 1x/week  PT DURATION: 8 weeks - anticipate 4 weeks   PLANNED INTERVENTIONS: 97164- PT Re-evaluation, 97110-Therapeutic exercises, 97530- Therapeutic activity, 97112- Neuromuscular re-education, 97535- Self Care, [id number]- Manual therapy, [phone]- Gait training, [phone]- Canalith repositioning, Patient/Family education, Balance training, Vestibular training, and  DME instructions  PLAN FOR NEXT SESSION: work on balance with head motions, SLS, EC balance on level ground and foam, re-cert vs. Dc?   Rebecca Campus, PT Rebecca Campus, PT, DPT, CBIS  05/01/2024, 12:37 PM

## 2024-05-06 ENCOUNTER — Encounter: Payer: Self-pay | Admitting: Physical Therapy

## 2024-05-06 ENCOUNTER — Ambulatory Visit: Attending: Otolaryngology | Admitting: Physical Therapy

## 2024-05-06 VITALS — BP 111/66 | HR 99

## 2024-05-06 DIAGNOSIS — R42 Dizziness and giddiness: Secondary | ICD-10-CM | POA: Diagnosis present

## 2024-05-06 DIAGNOSIS — R2681 Unsteadiness on feet: Secondary | ICD-10-CM | POA: Diagnosis present

## 2024-05-06 DIAGNOSIS — Z9181 History of falling: Secondary | ICD-10-CM | POA: Insufficient documentation

## 2024-05-06 NOTE — Therapy (Signed)
 OUTPATIENT PHYSICAL THERAPY VESTIBULAR TREATMENT/RE-CERT/DISCHARGE SUMMARY    Patient Name: Jeremiah [name] MRN: [id number] DOB:1946/02/20, 78 y.o., male Today's Date: 05/06/2024  END OF SESSION:  PT End of Session - 05/06/24 1320     Visit Number 8    Number of Visits 9    Date for PT Re-Evaluation 06/05/24   per re-cert on 4/0/98   Authorization Type [employer]    PT Start Time 1318    PT Stop Time 1356    PT Time Calculation (min) 38 min    Equipment Utilized During Treatment Oxygen ;Gait belt    Activity Tolerance Patient tolerated treatment well;Patient limited by fatigue    Behavior During Therapy WFL for tasks assessed/performed             Past Medical History:  Diagnosis Date   A-fib (HCC)    Acute on chronic systolic (congestive) heart failure (HCC) 06/10/2017   Acute pulmonary edema (HCC)    Anemia    Aortic valve sclerosis 12/2018   Noted on ECHO   Arthritis    CAD (coronary artery disease)    a. 2006: CABG in 2006 with LIMA-LAD, SVG-OM1, and SVG-RPDA   Cardiomegaly 12/2018   Stable, noted on CXR   Carpal tunnel syndrome    Right   Chronic pain 03/21/2016   Colon cancer (HCC) 2006   COPD (chronic obstructive pulmonary disease) (HCC)    Diabetes mellitus without complication (HCC)    DVT (deep venous thrombosis) (HCC)    Right   Essential hypertension 03/21/2016   GERD (gastroesophageal reflux disease)    History of blood transfusion    History of Clostridioides difficile infection    History of prostate cancer 03/21/2016   History of PSVT (paroxysmal supraventricular tachycardia)    HLD (hyperlipidemia)    HTN (hypertension)    Hx of CABG 2006   Hypercholesteremia 03/21/2016   Hypothyroidism    LAE (left atrial enlargement) 12/2018   Severe, Noted on ECHO   LVH (left ventricular hypertrophy) 12/2018   Mild, Noted on ECHO   Medication management 03/21/2016   Mitral annular calcification 12/2018   with mild MS, Noted on ECHO   Morbid  obesity (HCC) 03/21/2016   Myocardial infarct (HCC)    OSA (obstructive sleep apnea) 03/21/2016   uses oxygen  at night time   Pain in right ankle and joints of right foot 03/21/2016   Paronychia 03/21/2016   Phimosis    Pneumonia    Primary insomnia 03/21/2016   Prostate cancer (HCC) 2008   Pulmonary hypertension (HCC) 12/2018   Moderate, Noted on ECHO   Tobacco dependence 03/21/2016   Tricuspid regurgitation 12/2018   Mild, Noted on ECHO   Past Surgical History:  Procedure Laterality Date   ANKLE SURGERY Right 12/2013   CARDIAC CATHETERIZATION N/A 05/08/2016   Procedure: Left Heart Cath and Cors/Grafts Angiography;  Surgeon: Arty Binning, MD;  Location: [facility] INVASIVE CV LAB;  Service: Cardiovascular;  Laterality: N/A;   CIRCUMCISION N/A 10/07/2019   Procedure: CIRCUMCISION ADULT;  Surgeon: Andrez Banker, MD;  Location: [facility] ORS;  Service: Urology;  Laterality: N/A;   CORONARY ARTERY BYPASS GRAFT  2006   x3   ELECTROPHYSIOLOGIC STUDY N/A 08/24/2016   Procedure: SVT Ablation;  Surgeon: Will Cortland Ding, MD;  Location: [facility] INVASIVE CV LAB;  Service: Cardiovascular;  Laterality: N/A;   PACEMAKER IMPLANT N/A 08/23/2023   Procedure: PACEMAKER IMPLANT;  Surgeon: Lei Pump, MD;  Location: [facility] INVASIVE CV LAB;  Service: Cardiovascular;  Laterality: N/A;   PROSTATE SURGERY  2008   PTCA     UMBILICAL HERNIA REPAIR N/A 05/26/2022   Procedure: PRIMARY REPAIR OF STRANGULATED UMBILICAL HERNIA WITH PARTIAL OMENTECTOMY;  Surgeon: Lockie Rima, MD;  Location: [facility] OR;  Service: General;  Laterality: N/A;   Patient Active Problem List   Diagnosis Date Noted   Dizziness 02/13/2024   Sensorineural hearing loss, bilateral 02/13/2024   Iron  deficiency anemia, unspecified 10/07/2023   S/P placement of cardiac pacemaker 08/24/2023   Complete heart block (HCC) 08/22/2023   Aortic stenosis 08/22/2023   Heart block 08/22/2023   Cardiogenic shock (HCC) 08/22/2023   Peripheral arterial disease  (HCC) 08/13/2023   Chronic diastolic (congestive) heart failure (HCC) 06/05/2023   Acute on chronic respiratory failure with hypoxia (HCC) 06/05/2023   Acute on chronic diastolic CHF (congestive heart failure) (HCC) 06/05/2023   Ischemic colitis (HCC) 02/18/2023   Pulmonary hypertension, unspecified (HCC) 02/18/2023   New onset atrial fibrillation (HCC) 02/18/2023   Mitral stenosis 02/18/2023   Iron  deficiency anemia 10/07/2022   Former smoker 08/21/2022   Incarcerated umbilical hernia 05/26/2022   History of supraventricular tachycardia 05/26/2022   Type 2 diabetes mellitus with hyperglycemia, without long-term current use of insulin  (HCC) 04/29/2020   Carotid artery calcification 09/09/2018   Coronary artery disease involving coronary bypass graft of native heart without angina pectoris 07/22/2017   COPD GOLD II with reversibility 04/03/2017   Chronic respiratory failure with hypoxia and hypercapnia (HCC) 04/03/2017   Hyperkalemia 02/26/2017   Prolonged QT interval 02/26/2017   Skin abscess 02/13/2017   Exertional shortness of breath 02/12/2017   Enteritis due to Clostridium difficile 01/05/2017   Acute GI bleeding 01/03/2017   Symptomatic anemia 01/03/2017   Hypothyroidism, acquired 01/03/2017   Cough    Diarrhea    AVNRT (AV nodal re-entry tachycardia) (HCC) 08/24/2016   NSTEMI (non-ST elevated myocardial infarction) (HCC) 05/05/2016   SVT (supraventricular tachycardia) (HCC) 03/21/2016   S/P CABG x 3 03/21/2016   Morbid obesity due to excess calories (HCC) complicated by hbp/hyperlipidemia  03/21/2016   Essential hypertension 03/21/2016   Hyperlipidemia LDL goal <70 03/21/2016   Paronychia 03/21/2016   Tobacco dependence 03/21/2016   Pain in right ankle and joints of right foot 03/21/2016   Chronic pain 03/21/2016   OSA (obstructive sleep apnea) 03/21/2016   Medication management 03/21/2016   History of prostate cancer 03/21/2016   Primary insomnia 03/21/2016    Post-traumatic osteoarthritis of right ankle 11/07/2015   Closed displaced fracture of medial malleolus of right tibia 10/24/2015    PCP: Lanae Pinal, MD  REFERRING PROVIDER: Reynold Caves, MD   REFERRING DIAG: R42 (ICD-10-CM) - Dizziness   THERAPY DIAG:  Dizziness and giddiness  Unsteadiness on feet  History of falling  ONSET DATE: 03/03/2024  Rationale for Evaluation and Treatment: Rehabilitation  SUBJECTIVE:   SUBJECTIVE STATEMENT: Pt reports no changes in his balance since starting therapy. Pt reports using a cane will make him fall more and has tried them before.   Pt accompanied by: self  PERTINENT HISTORY: PMH: coronary artery disease, A-fib, aortic valve stenosis, status post coronary artery bypass graft and pacemaker placement, hx of severe anemia, COPD, diabetes mellitus, HTN, HLD, hx of MI in 2006  Pt currently awaiting heart valve surgery   Per Dr. Darlin Ehrlich: The patient describes his dizziness as a spinning vertigo that lasts for minutes. In between episodes, he also complains of lightheaded and off-balance sensation.   PAIN:  Are  you having pain? No  Vitals:   05/06/24 1326  BP: 111/66  Pulse: 99  SpO2: 96%     PRECAUTIONS: Fall and ICD/Pacemaker   PATIENT GOALS: Wants to be able to walk straight   OBJECTIVE:  Note: Objective measures were completed at Evaluation unless otherwise noted.  DIAGNOSTIC FINDINGS: 12/19/23:  CT Angio Head/Neck IMPRESSION: 1. No emergent large vessel occlusion. Widespread cerebrovascular atherosclerosis. 2. Severe stenosis of the left ICA distal cavernous segment and moderate right ICA cavernous segment stenosis. 3. Moderate stenosis of both vertebral artery V4 segments. 4. A 50% stenosis of the proximal right ICA.                                                                                                                            TREATMENT   Therapeutic Activity:   Vitals:   05/06/24 1326  BP: 111/66   Pulse: 99  SpO2: 96%  Assessed BP at start of session and WNL     [facility] PT Assessment - 05/06/24 1328       Functional Gait  Assessment   Gait assessed  Yes    Gait Level Surface Walks 20 ft, slow speed, abnormal gait pattern, evidence for imbalance or deviates 10-15 in outside of the 12 in walkway width. Requires more than 7 sec to ambulate 20 ft.   7.7   Change in Gait Speed Able to smoothly change walking speed without loss of balance or gait deviation. Deviate no more than 6 in outside of the 12 in walkway width.    Gait with Horizontal Head Turns Performs head turns smoothly with slight change in gait velocity (eg, minor disruption to smooth gait path), deviates 6-10 in outside 12 in walkway width, or uses an assistive device.    Gait with Vertical Head Turns Performs task with slight change in gait velocity (eg, minor disruption to smooth gait path), deviates 6 - 10 in outside 12 in walkway width or uses assistive device    Gait and Pivot Turn Pivot turns safely within 3 sec and stops quickly with no loss of balance.    Step Over Obstacle Is able to step over 2 stacked shoe boxes taped together (9 in total height) without changing gait speed. No evidence of imbalance.    Gait with Narrow Base of Support Ambulates less than 4 steps heel to toe or cannot perform without assistance.    Gait with Eyes Closed Walks 20 ft, uses assistive device, slower speed, mild gait deviations, deviates 6-10 in outside 12 in walkway width. Ambulates 20 ft in less than 9 sec but greater than 7 sec.   8 seconds   Ambulating Backwards Walks 20 ft, uses assistive device, slower speed, mild gait deviations, deviates 6-10 in outside 12 in walkway width.    Steps Alternating feet, must use rail.    Total Score 20    FGA comment: 20/30 = Moderate Fall Risk  Pt needing seated rest break during FGA, O2 at 96% and HR at 125 bpm   mCTSIB: Condition 3: 30 seconds Condition 4: 30 seconds with  mild/moderate sway   Reviewed HEP (see MedBridge section below) Discussed making marching harder by performing slowly and trying not to use UE support and can gradually bring feet in closer together with EC balance on foam   PHYSICAL THERAPY DISCHARGE SUMMARY  Visits from Start of Care: 8  Current functional level related to goals / functional outcomes: See LTGs/Clinical Assessment Statement   Remaining deficits: Impaired balance, decr activity tolerance, decr functional strength   Education / Equipment: HEP    Patient agrees to discharge. Patient goals were met. Patient is being discharged due to maximized rehab potential.  And meeting rehab goals.    PATIENT EDUCATION: Education details: continue HEP, D/C from therapy  Person educated: Patient Education method: Explanation and Verbal cues Education comprehension: verbalized understanding and returned demonstration  HOME EXERCISE PROGRAM: Access Code: [email or web address] URL: [email or web address] Date: 03/18/2024 Prepared by: Jonathan Neighbor  Exercises - Wide Stance with Eyes Closed on Foam Pad  - 2 x daily - 5 x weekly - 3 sets - 30 hold - Romberg Stance on Foam Pad with Head Rotation  - 2 x daily - 5 x weekly - 2 sets - 10 reps - Tandem Walking with Counter Support  - 2 x daily - 5 x weekly - 3 sets - Standing Marching  - 2 x daily - 5 x weekly - 3 sets - Walking with Head Rotation  - 1-2 x daily - 5 x weekly - 3 sets and head nods   GOALS: Goals reviewed with patient? Yes  SHORT TERM GOALS: ALL STGS = LTGS  LONG TERM GOALS: Target date: 04/03/2024  Pt will be independent with final HEP for balance in order to build upon functional gains made in therapy. Baseline:  Goal status: IN PROGRESS  2.  Pt will improve FGA to at least a 18/30 in order to demo decr fall risk.  Baseline: 14/30; 17/30 on 04/01/24 Goal status: NOT MET  3.  Pt will improve condition 2 of mCTSIB to at least 30 seconds to demo improved  balance with EC. Baseline: 12.4 Sec  30 seconds (4/30) Goal status: MET  4.  Pt will improve condition 4 of mCTSIB to at least 20 seconds to demo improved vestibular input for balance.  Baseline: 7.8 Sec  19.7 seconds (4/30) Goal status: MET  UPDATED/ONGOING LTGS FOR 8 WEEK POC:  LONG TERM GOALS: Target date: 05/05/24  Pt will be independent with final HEP for balance in order to build upon functional gains made in therapy. Baseline:  Goal status: MET  2.  Pt will improve FGA to at least a 21/30 in order to demo decr fall risk.  Baseline: 14/30; 17/30 on 04/01/24  20/30 on 05/06/24 Goal status: NOT MET   3.  Pt will improve condition 4 of mCTSIB to at least 25 seconds to demo improved vestibular input for balance.  Baseline: 7.8 Sec  19.7 seconds (4/30)  30 seconds (6/4) Goal status: MET    ASSESSMENT:  CLINICAL IMPRESSION: Today's skilled session focused on assessing LTGs. Pt has met LTG #1 and #3 in regards to HEP and condition 4 of mCTSIB. Pt able to hold condition 4 of mCTSIB for 30 seconds today with mild/mod postural sway (at eval was 7.8 seconds), indicating improved vestibular input for balance. Pt improved FGA to a 20/30, but not  quite to goal level, but pt is at a moderate fall risk instead of a high fall risk. Pt needing rest breaks during session due to BLE pain and needing to use supplemental O2. Due to pt reaching maximum rehab potential and achieving goals, will D/C from PT at this time with pt to continue to work on HEP for balance. Pt in agreement with plan.     OBJECTIVE IMPAIRMENTS: Abnormal gait, cardiopulmonary status limiting activity, decreased activity tolerance, decreased balance, decreased endurance, decreased mobility, difficulty walking, dizziness, and postural dysfunction.   ACTIVITY LIMITATIONS: bending, standing, stairs, transfers, and locomotion level  PARTICIPATION LIMITATIONS: community activity and yard work  PERSONAL FACTORS: Age,  Behavior pattern, Past/current experiences, Time since onset of injury/illness/exacerbation, and 3+ comorbidities: coronary artery disease, A-fib, aortic valve stenosis, status post coronary artery bypass graft and pacemaker placement, hx of severe anemia, COPD, diabetes mellitus, HTN, HLD, hx of MI in 2006 are also affecting patient's functional outcome.   REHAB POTENTIAL: Good  CLINICAL DECISION MAKING: Evolving/moderate complexity  EVALUATION COMPLEXITY: Moderate   PLAN:  PT FREQUENCY: 1x/week  PT DURATION: 8 weeks - anticipate 4 weeks   PLANNED INTERVENTIONS: [id number]- PT Re-evaluation, 97110-Therapeutic exercises, 97530- Therapeutic activity, [id number]- Neuromuscular re-education, 97535- Self Care, [id number]- Manual therapy, [id number]- Gait training, [id number]- Canalith repositioning, Patient/Family education, Balance training, Vestibular training, and DME instructions  PLAN FOR NEXT SESSION: D/C from PT    Jonathan Neighbor, PT, DPT 05/06/24 2:07 PM

## 2024-05-07 NOTE — Progress Notes (Signed)
 [facility] OFFICE PROGRESS NOTE  Koirala, Dibas, MD [address] [place] [address]  DIAGNOSIS: Iron  deficiency anemia secondary to chronic gastrointestinal hemorrhage   PRIOR THERAPY: 1) Iron  infusion with Ferrlecit 250 Mg IV for 4 doses. Last dose was given on 06/09/2023  2) IV iron  infusion with feraheme, last dose on 12/17/23  CURRENT THERAPY: Oral ferrous sulfate  with orange juice as well as oral vitamin B12 1000 mcg p.o. daily and blood transfusions PRN to keep Hbg >8.   INTERVAL HISTORY: Jeremiah [name] 78 y.o. male returns for a follow-up visit accompanied by his wife. The patient was last seen by myself on 04/08/24.    In summary the patient has a history of anemia secondary to GI blood loss.  He is followed by GI.  He saw Dr. Darolyn Elks at [facility] who arrange for retrograde device assisted endoscopy and the patient has had improvement in his anemia since that time. An area of bleeding was identified and cauterized.   The patient receives IV iron  infusions on an as-needed basis most recent being on December 17, 2023.  He did have a prior reaction with significant dizziness for which he went to ER. Therefore, we try not to give him IV iron .   When I saw him in the winter the patient I strongly encouraged him to also take a p.o. iron  supplement for maintenance. It would be ideal to avoid IV iron  unless needed.  He is currently taking this BID and he is compliant with this. However, it does cause stomach upset. He also takes vitamin B12 and 81 mg aspirin .  Eliquis  was discontinued but they are considering resuming this soon due to claudication as long as his Hbg is stable.[name]  He previously was seeing a vascular provider at [facility] for claudication but he needed to get his anemia stabilized before addressing that problem per patient report.  He is also followed closely by cardiology.  He also saw ENT in March 2025 for dizziness.  They recommended vestibular  neurodiagnostic testing.  He underwent physical therapy for vertigo. This has been completed but they are in the process of getting additional PT at [facility].    His energy is good. No significant bleeding issues such as epistaxis or gum bleeding. Occasionally coughs up brown phlegm, attributed to coffee or chocolate Ensure. Stools are of normal color, and no recent episodes of syncope. He has some easy bruising and is on aspirin .  He is here for evaluation and repeat blood work       MEDICAL HISTORY: Past Medical History:  Diagnosis Date   A-fib [facility])    Acute on chronic systolic (congestive) heart failure (HCC) 06/10/2017   Acute pulmonary edema (HCC)    Anemia    Aortic valve sclerosis 12/2018   Noted on ECHO   Arthritis    CAD (coronary artery disease)    a. 2006: CABG in 2006 with LIMA-LAD, SVG-OM1, and SVG-RPDA   Cardiomegaly 12/2018   Stable, noted on CXR   Carpal tunnel syndrome    Right   Chronic pain 03/21/2016   Colon cancer (HCC) 2006   COPD (chronic obstructive pulmonary disease) (HCC)    Diabetes mellitus without complication (HCC)    DVT (deep venous thrombosis) (HCC)    Right   Essential hypertension 03/21/2016   GERD (gastroesophageal reflux disease)    History of blood transfusion    History of Clostridioides difficile infection    History of prostate cancer  03/21/2016   History of PSVT (paroxysmal supraventricular tachycardia)    HLD (hyperlipidemia)    HTN (hypertension)    Hx of CABG 2006   Hypercholesteremia 03/21/2016   Hypothyroidism    LAE (left atrial enlargement) 12/2018   Severe, Noted on ECHO   LVH (left ventricular hypertrophy) 12/2018   Mild, Noted on ECHO   Medication management 03/21/2016   Mitral annular calcification 12/2018   with mild MS, Noted on ECHO   Morbid obesity (HCC) 03/21/2016   Myocardial infarct (HCC)    OSA (obstructive sleep apnea) 03/21/2016   uses oxygen  at night time   Pain in right ankle and joints of right  foot 03/21/2016   Paronychia 03/21/2016   Phimosis    Pneumonia    Primary insomnia 03/21/2016   Prostate cancer (HCC) 2008   Pulmonary hypertension (HCC) 12/2018   Moderate, Noted on ECHO   Tobacco dependence 03/21/2016   Tricuspid regurgitation 12/2018   Mild, Noted on ECHO    ALLERGIES:  is allergic to shellfish allergy and fluzone [influenza virus vaccine].  MEDICATIONS:  Current Outpatient Medications  Medication Sig Dispense Refill   FeFum-FePoly-FA-B Cmp-C-Biot (INTEGRA PLUS) CAPS Take 1 capsule by mouth every morning. 30 capsule 2   aspirin  EC 81 MG tablet Take 1 tablet (81 mg total) by mouth daily. Swallow whole. 90 tablet 3   atorvastatin  (LIPITOR ) 80 MG tablet Take 80 mg by mouth daily.     benzonatate  (TESSALON ) 200 MG capsule Take 1 capsule (200 mg total) by mouth 3 (three) times daily as needed. 45 capsule 3   budesonide  (PULMICORT ) 0.5 MG/2ML nebulizer solution Take 2 mLs (0.5 mg total) by nebulization 2 (two) times daily. 120 mL 11   cetirizine (ZYRTEC) 10 MG tablet Take 10 mg by mouth daily.     colchicine  0.6 MG tablet Take 0.6 mg by mouth daily as needed (Gout).     dicyclomine  (BENTYL ) 10 MG capsule Take 10 mg by mouth 3 (three) times daily before meals. 30 minutes before eating     ezetimibe  (ZETIA ) 10 MG tablet Take 1 tablet (10 mg total) by mouth daily. 90 tablet 3   ferrous sulfate  325 (65 FE) MG EC tablet Take 325 mg by mouth daily with breakfast.     fluticasone  (FLONASE ) 50 MCG/ACT nasal spray SHAKE LIQUID AND USE 1 SPRAY IN EACH NOSTRIL DAILY (Patient taking differently: Place 1 spray into both nostrils daily.) 16 g 2   formoterol  (PERFOROMIST ) 20 MCG/2ML nebulizer solution Take 2 mLs (20 mcg total) by nebulization 2 (two) times daily. 120 mL 6   furosemide  (LASIX ) 40 MG tablet Take 2 tablets (80 mg total) by mouth daily. (Patient taking differently: Take 80 mg by mouth 2 (two) times daily.) 30 tablet 0   glimepiride (AMARYL) 2 MG tablet Take 4 mg by mouth  every morning.     levothyroxine  (SYNTHROID , LEVOTHROID) 88 MCG tablet Take 88 mcg by mouth daily before breakfast. Take on an empty stomach     metFORMIN (GLUCOPHAGE-XR) 500 MG 24 hr tablet Take 2,000 mg by mouth daily.     omeprazole (PRILOSEC) 40 MG capsule Take 40 mg by mouth daily.     Potassium Chloride  ER 20 MEQ TBCR Take 1 tablet by mouth daily.     revefenacin  (YUPELRI ) 175 MCG/3ML nebulizer solution Take 3 mLs (175 mcg total) by nebulization daily. 90 mL 5   spironolactone  (ALDACTONE ) 25 MG tablet Take 1 tablet (25 mg total) by mouth daily. 30  tablet 0   No current facility-administered medications for this visit.    SURGICAL HISTORY:  Past Surgical History:  Procedure Laterality Date   ANKLE SURGERY Right 12/2013   CARDIAC CATHETERIZATION N/A 05/08/2016   Procedure: Left Heart Cath and Cors/Grafts Angiography;  Surgeon: Arty Binning, MD;  Location: [facility] INVASIVE CV LAB;  Service: Cardiovascular;  Laterality: N/A;   CIRCUMCISION N/A 10/07/2019   Procedure: CIRCUMCISION ADULT;  Surgeon: Andrez Banker, MD;  Location: [facility] ORS;  Service: Urology;  Laterality: N/A;   CORONARY ARTERY BYPASS GRAFT  2006   x3   ELECTROPHYSIOLOGIC STUDY N/A 08/24/2016   Procedure: SVT Ablation;  Surgeon: Will Cortland Ding, MD;  Location: [facility] INVASIVE CV LAB;  Service: Cardiovascular;  Laterality: N/A;   PACEMAKER IMPLANT N/A 08/23/2023   Procedure: PACEMAKER IMPLANT;  Surgeon: Lei Pump, MD;  Location: [facility] INVASIVE CV LAB;  Service: Cardiovascular;  Laterality: N/A;   PROSTATE SURGERY  2008   PTCA     UMBILICAL HERNIA REPAIR N/A 05/26/2022   Procedure: PRIMARY REPAIR OF STRANGULATED UMBILICAL HERNIA WITH PARTIAL OMENTECTOMY;  Surgeon: Lockie Rima, MD;  Location: [facility] OR;  Service: General;  Laterality: N/A;    REVIEW OF SYSTEMS:   Constitutional: Negative for appetite change, chills, fatigue, fever and unexpected weight change.  HENT: Negative for mouth sores, nosebleeds, sore throat and  trouble swallowing.   Eyes: Negative for eye problems and icterus.  Respiratory: Negative for cough, hemoptysis, shortness of breath and wheezing.   Cardiovascular: Negative for chest pain and leg swelling.  Gastrointestinal: Positive for GI upset with abdominal discomfort with iron  supplement. Negative for abdominal pain, constipation, nausea and vomiting.  Genitourinary: Negative for bladder incontinence, difficulty urinating, dysuria, frequency and hematuria.   Musculoskeletal: Negative for back pain, gait problem, neck pain and neck stiffness.  Skin: Negative for itching and rash.  Neurological: Negative for dizziness, extremity weakness, gait problem, headaches, light-headedness and seizures.  Hematological: Negative for adenopathy. Does not bleed easily. Positive for bruising.  Psychiatric/Behavioral: Negative for confusion, depression and sleep disturbance. The patient is not nervous/anxious.   PHYSICAL EXAMINATION:  Blood pressure (!) 99/54, pulse 95, temperature 97.7 F (36.5 C), temperature source Temporal, resp. rate 14, weight 213 lb 14.4 oz (97 kg), SpO2 95%.  ECOG PERFORMANCE STATUS: 1  Physical Exam  Constitutional: Oriented to person, place, and time and well-developed, well-nourished, and in no distress.  HENT:  Head: Normocephalic and atraumatic.  Mouth/Throat: Oropharynx is clear and moist. No oropharyngeal exudate.  Eyes: Conjunctivae are normal. Right eye exhibits no discharge. Left eye exhibits no discharge. No scleral icterus.  Neck: Normal range of motion. Neck supple.  Cardiovascular: Normal rate, regular rhythm, faint murmur noted in the 2nd right intercostal space and intact distal pulses.   Pulmonary/Chest: Effort normal. Quiet breath sounds bilaterally. No respiratory distress. No wheezes. No rales.  Abdominal: Soft. Bowel sounds are normal. Exhibits no distension and no mass. There is no tenderness.  Musculoskeletal: Normal range of motion. Exhibits no  edema.  Lymphadenopathy:    No cervical adenopathy.  Neurological: Alert and oriented to person, place, and time. Exhibits normal muscle tone. Gait normal. Coordination normal.  Skin: Skin is warm and dry. No rash noted. Not diaphoretic. No erythema. No pallor.  Psychiatric: Mood, memory and judgment normal.  Vitals reviewed.  LABORATORY DATA: Lab Results  Component Value Date   WBC 4.8 05/12/2024   HGB 13.1 05/12/2024   HCT 39.3 05/12/2024   MCV 91.0 05/12/2024  PLT 174 05/12/2024      Chemistry      Component Value Date/Time   NA 139 02/03/2024 0941   NA 138 11/12/2023 1027   K 4.4 02/03/2024 0941   CL 99 02/03/2024 0941   CO2 32 02/03/2024 0941   BUN 19 02/03/2024 0941   BUN 19 11/12/2023 1027   CREATININE 0.98 02/03/2024 0941      Component Value Date/Time   CALCIUM  9.7 02/03/2024 0941   ALKPHOS 88 02/03/2024 0941   AST 16 02/03/2024 0941   ALT 16 02/03/2024 0941   BILITOT 0.4 02/03/2024 0941       RADIOGRAPHIC STUDIES:  No results found.   ASSESSMENT/PLAN:  This is a very pleasant 77 year old Caucasian male with iron  deficiency anemia secondary to presumed GI blood loss.   He is receiving frequent blood transfusions.  He also receives IV iron  infusions on an as-needed basis the most recent on 12/17/23 with Feraheme. He had some dizziness with this and Dr. Marguerita Shih would consider prednisone  or solumedrol for pre-medications moving forward. We can also consider changing his IV iron  to venofer  200 mg.    Labs were reviewed today.  His hemoglobin is 13.1. He does not need a blood transfusion today.  He would consider blood transfusion if his hemoglobin were less than 8.  His iron  studies are pending. As long as he is stable, we will try to avoid IV iron  unless absolutely needed due to prior intolerance.    He is able to tell when his hemoglobin is low and knows he is always welcome to call us  sooner for same-day lab appointment.  I will place standing orders for  blood bank hold's.   He saw Dr. Darolyn Elks at [facility] who found an area of bleeding which was cauterized.   I have sent prescription iron  with integra plus to take once daily with OJ. This may be more tolerable. I also recommended he take this at night time. If GI upset, he can try slow FE.    We will call him or send him a chart message we have the results of his iron  studies  I discussed monitoring his labs once every 6-8 weeks but the patient may be resuming his blood thinner soon so they prefer close monitoring with labs and follow up in 1 month.   Since he had some intolerance with fereheme in the past, should he require IV iron , we can consider adding premedications and changing to venofer .    We will see him back in 4 weeks just to ensure stabilization in his Hbg. Once his Hbg has remained stable for 3-4 months, we can see him less frequently.   Vertigo Vertigo persists despite previous physical therapy. Primary care physician recommends further physical therapy. - Coordinate with primary care physician to arrange additional physical therapy sessions at the [place] location  Hypotension Low blood pressure possibly due to spironolactone  and Lasix . Risk of dehydration and dizziness noted. - Monitor blood pressure regularly, especially if experiencing lightheadedness. - Ensure adequate hydration to prevent dehydration.   The patient was advised to call immediately if he has any concerning symptoms in the interval. The patient voices understanding of current disease status and treatment options and is in agreement with the current care plan. All questions were answered. The patient knows to call the clinic with any problems, questions or concerns. We can certainly see the patient much sooner if necessary        Orders Placed This Encounter  Procedures   CBC with Differential (Cancer Center Only)    Standing Status:   Future    Expected Date:   06/11/2024    Expiration Date:    05/12/2025   Ferritin    Standing Status:   Future    Expected Date:   06/11/2024    Expiration Date:   05/12/2025   Iron  and Iron  Binding Capacity ([facility] only)    Standing Status:   Future    Expected Date:   06/11/2024    Expiration Date:   05/12/2025     The total time spent in the appointment was 20-29 minutes  Keevin Panebianco L Joella Saefong, PA-C 05/12/24

## 2024-05-12 ENCOUNTER — Inpatient Hospital Stay: Attending: Physician Assistant

## 2024-05-12 ENCOUNTER — Inpatient Hospital Stay: Admitting: Physician Assistant

## 2024-05-12 VITALS — BP 99/54 | HR 95 | Temp 97.7°F | Resp 14 | Wt 213.9 lb

## 2024-05-12 DIAGNOSIS — R42 Dizziness and giddiness: Secondary | ICD-10-CM | POA: Diagnosis not present

## 2024-05-12 DIAGNOSIS — R109 Unspecified abdominal pain: Secondary | ICD-10-CM

## 2024-05-12 DIAGNOSIS — D500 Iron deficiency anemia secondary to blood loss (chronic): Secondary | ICD-10-CM | POA: Diagnosis present

## 2024-05-12 DIAGNOSIS — K922 Gastrointestinal hemorrhage, unspecified: Secondary | ICD-10-CM | POA: Insufficient documentation

## 2024-05-12 DIAGNOSIS — I959 Hypotension, unspecified: Secondary | ICD-10-CM | POA: Insufficient documentation

## 2024-05-12 LAB — IRON AND IRON BINDING CAPACITY (CC-WL,HP ONLY)
Iron: 150 ug/dL (ref 45–182)
Saturation Ratios: 35 % (ref 17.9–39.5)
TIBC: 427 ug/dL (ref 250–450)
UIBC: 277 ug/dL (ref 117–376)

## 2024-05-12 LAB — CBC WITH DIFFERENTIAL (CANCER CENTER ONLY)
Abs Immature Granulocytes: 0.02 10*3/uL (ref 0.00–0.07)
Basophils Absolute: 0 10*3/uL (ref 0.0–0.1)
Basophils Relative: 1 %
Eosinophils Absolute: 0.1 10*3/uL (ref 0.0–0.5)
Eosinophils Relative: 3 %
HCT: 39.3 % (ref 39.0–52.0)
Hemoglobin: 13.1 g/dL (ref 13.0–17.0)
Immature Granulocytes: 0 %
Lymphocytes Relative: 27 %
Lymphs Abs: 1.3 10*3/uL (ref 0.7–4.0)
MCH: 30.3 pg (ref 26.0–34.0)
MCHC: 33.3 g/dL (ref 30.0–36.0)
MCV: 91 fL (ref 80.0–100.0)
Monocytes Absolute: 0.5 10*3/uL (ref 0.1–1.0)
Monocytes Relative: 11 %
Neutro Abs: 2.8 10*3/uL (ref 1.7–7.7)
Neutrophils Relative %: 58 %
Platelet Count: 174 10*3/uL (ref 150–400)
RBC: 4.32 MIL/uL (ref 4.22–5.81)
RDW: 15.4 % (ref 11.5–15.5)
WBC Count: 4.8 10*3/uL (ref 4.0–10.5)
nRBC: 0 % (ref 0.0–0.2)

## 2024-05-12 LAB — SAMPLE TO BLOOD BANK

## 2024-05-12 LAB — FERRITIN: Ferritin: 64 ng/mL (ref 24–336)

## 2024-05-12 MED ORDER — INTEGRA PLUS PO CAPS: 1.0000 | ORAL_CAPSULE | Freq: Every morning | ORAL | 2 refills | Status: DC

## 2024-05-21 ENCOUNTER — Telehealth: Payer: Self-pay | Admitting: Internal Medicine

## 2024-05-21 NOTE — Telephone Encounter (Signed)
 Patient had surgery with Dr. Darolyn Elks at [facility] in February for his GI bleed, and he is going good with a H & H of 13.1 & 39.3 on 05/12/24. Patient stated he would like to get back on his blood thinner so he can start preparing for his peripheral intervention with Dr. Katheryne Pane. Made patient an appointment next available with Dr. Maximo Spar to discuss. Will see if Dr. Maximo Spar wants to go ahead and have patient start back on eliquis .

## 2024-05-21 NOTE — Telephone Encounter (Signed)
 Called patient with Dr. Candida Chalk advisement. Patient will discuss with Dr. Maximo Spar at his office visit.

## 2024-05-21 NOTE — Telephone Encounter (Signed)
 Pt is requesting a callback regarding another procedure being done on his leg. Please advise

## 2024-05-25 ENCOUNTER — Ambulatory Visit (INDEPENDENT_AMBULATORY_CARE_PROVIDER_SITE_OTHER): Payer: Federal, State, Local not specified - PPO

## 2024-05-25 DIAGNOSIS — I442 Atrioventricular block, complete: Secondary | ICD-10-CM

## 2024-05-26 LAB — CUP PACEART REMOTE DEVICE CHECK
Battery Remaining Longevity: 121 mo
Battery Remaining Percentage: 95.5 %
Battery Voltage: 3.01 V
Brady Statistic AP VP Percent: 3.9 %
Brady Statistic AP VS Percent: 2.3 %
Brady Statistic AS VP Percent: 24 %
Brady Statistic AS VS Percent: 69 %
Brady Statistic RA Percent Paced: 5.3 %
Brady Statistic RV Percent Paced: 28 %
Date Time Interrogation Session: 20250623101407
Implantable Lead Connection Status: 753985
Implantable Lead Connection Status: 753985
Implantable Lead Implant Date: 20240920
Implantable Lead Implant Date: 20240920
Implantable Lead Location: 753859
Implantable Lead Location: 753860
Implantable Pulse Generator Implant Date: 20240920
Lead Channel Impedance Value: 450 Ohm
Lead Channel Impedance Value: 460 Ohm
Lead Channel Pacing Threshold Amplitude: 0.625 V
Lead Channel Pacing Threshold Amplitude: 0.625 V
Lead Channel Pacing Threshold Pulse Width: 0.5 ms
Lead Channel Pacing Threshold Pulse Width: 0.5 ms
Lead Channel Sensing Intrinsic Amplitude: 12 mV
Lead Channel Sensing Intrinsic Amplitude: 5 mV
Lead Channel Setting Pacing Amplitude: 0.875
Lead Channel Setting Pacing Amplitude: 1.625
Lead Channel Setting Pacing Pulse Width: 0.5 ms
Lead Channel Setting Sensing Sensitivity: 2 mV
Pulse Gen Model: 2272
Pulse Gen Serial Number: 8213521

## 2024-05-27 ENCOUNTER — Ambulatory Visit: Payer: Self-pay | Admitting: Cardiology

## 2024-05-27 ENCOUNTER — Ambulatory Visit: Admitting: Sports Medicine

## 2024-05-27 ENCOUNTER — Ambulatory Visit
Admission: RE | Admit: 2024-05-27 | Discharge: 2024-05-27 | Disposition: A | Discharge status: Home / Self Care | Source: Ambulatory Visit | Attending: Sports Medicine

## 2024-05-27 VITALS — BP 115/65 | Ht 68.0 in | Wt 207.0 lb

## 2024-05-27 DIAGNOSIS — M79645 Pain in left finger(s): Secondary | ICD-10-CM

## 2024-05-27 DIAGNOSIS — M79644 Pain in right finger(s): Secondary | ICD-10-CM

## 2024-05-27 NOTE — Progress Notes (Signed)
 PCP: Regino Slater, MD  Subjective:   HPI: Patient is a 78 y.o. male here for bilateral thumb pain.  He states that for the past 8 weeks he has struggled with pain in his 1st MCP bilaterally. He was referred by his PCP who was concerned he may have trigger thumb. He describes the pain as sharp and worsened by thumb flexion. For his right thumb, he has also been experiencing some catching of the IP joint but states that it is not to the point where he needs to pull hip thumb into extension. He is not having any numbness, tingling, weakness in the hands nor any redness, swelling of the joints.   Past Medical History:  Diagnosis Date   A-fib [facility])    Acute on chronic systolic (congestive) heart failure (HCC) 06/10/2017   Acute pulmonary edema (HCC)    Anemia    Aortic valve sclerosis 12/2018   Noted on ECHO   Arthritis    CAD (coronary artery disease)    a. 2006: CABG in 2006 with LIMA-LAD, SVG-OM1, and SVG-RPDA   Cardiomegaly 12/2018   Stable, noted on CXR   Carpal tunnel syndrome    Right   Chronic pain 03/21/2016   Colon cancer (HCC) 2006   COPD (chronic obstructive pulmonary disease) (HCC)    Diabetes mellitus without complication (HCC)    DVT (deep venous thrombosis) (HCC)    Right   Essential hypertension 03/21/2016   GERD (gastroesophageal reflux disease)    History of blood transfusion    History of Clostridioides difficile infection    History of prostate cancer 03/21/2016   History of PSVT (paroxysmal supraventricular tachycardia)    HLD (hyperlipidemia)    HTN (hypertension)    Hx of CABG 2006   Hypercholesteremia 03/21/2016   Hypothyroidism    LAE (left atrial enlargement) 12/2018   Severe, Noted on ECHO   LVH (left ventricular hypertrophy) 12/2018   Mild, Noted on ECHO   Medication management 03/21/2016   Mitral annular calcification 12/2018   with mild MS, Noted on ECHO   Morbid obesity (HCC) 03/21/2016   Myocardial infarct (HCC)    OSA (obstructive  sleep apnea) 03/21/2016   uses oxygen  at night time   Pain in right ankle and joints of right foot 03/21/2016   Paronychia 03/21/2016   Phimosis    Pneumonia    Primary insomnia 03/21/2016   Prostate cancer (HCC) 2008   Pulmonary hypertension (HCC) 12/2018   Moderate, Noted on ECHO   Tobacco dependence 03/21/2016   Tricuspid regurgitation 12/2018   Mild, Noted on ECHO    Current Outpatient Medications on File Prior to Visit  Medication Sig Dispense Refill   aspirin  EC 81 MG tablet Take 1 tablet (81 mg total) by mouth daily. Swallow whole. 90 tablet 3   atorvastatin  (LIPITOR ) 80 MG tablet Take 80 mg by mouth daily.     benzonatate  (TESSALON ) 200 MG capsule Take 1 capsule (200 mg total) by mouth 3 (three) times daily as needed. 45 capsule 3   budesonide  (PULMICORT ) 0.5 MG/2ML nebulizer solution Take 2 mLs (0.5 mg total) by nebulization 2 (two) times daily. 120 mL 11   cetirizine (ZYRTEC) 10 MG tablet Take 10 mg by mouth daily.     colchicine  0.6 MG tablet Take 0.6 mg by mouth daily as needed (Gout).     dicyclomine  (BENTYL ) 10 MG capsule Take 10 mg by mouth 3 (three) times daily before meals. 30 minutes before eating  ezetimibe  (ZETIA ) 10 MG tablet Take 1 tablet (10 mg total) by mouth daily. 90 tablet 3   FeFum-FePoly-FA-B Cmp-C-Biot (INTEGRA PLUS ) CAPS Take 1 capsule by mouth every morning. 30 capsule 2   ferrous sulfate  325 (65 FE) MG EC tablet Take 325 mg by mouth daily with breakfast.     fluticasone  (FLONASE ) 50 MCG/ACT nasal spray SHAKE LIQUID AND USE 1 SPRAY IN EACH NOSTRIL DAILY (Patient taking differently: Place 1 spray into both nostrils daily.) 16 g 2   formoterol  (PERFOROMIST ) 20 MCG/2ML nebulizer solution Take 2 mLs (20 mcg total) by nebulization 2 (two) times daily. 120 mL 6   furosemide  (LASIX ) 40 MG tablet Take 2 tablets (80 mg total) by mouth daily. (Patient taking differently: Take 80 mg by mouth 2 (two) times daily.) 30 tablet 0   glimepiride (AMARYL) 2 MG tablet  Take 4 mg by mouth every morning.     levothyroxine  (SYNTHROID , LEVOTHROID) 88 MCG tablet Take 88 mcg by mouth daily before breakfast. Take on an empty stomach     metFORMIN (GLUCOPHAGE-XR) 500 MG 24 hr tablet Take 2,000 mg by mouth daily.     omeprazole (PRILOSEC) 40 MG capsule Take 40 mg by mouth daily.     Potassium Chloride  ER 20 MEQ TBCR Take 1 tablet by mouth daily.     revefenacin  (YUPELRI ) 175 MCG/3ML nebulizer solution Take 3 mLs (175 mcg total) by nebulization daily. 90 mL 5   spironolactone  (ALDACTONE ) 25 MG tablet Take 1 tablet (25 mg total) by mouth daily. 30 tablet 0   No current facility-administered medications on file prior to visit.    Past Surgical History:  Procedure Laterality Date   ANKLE SURGERY Right 12/2013   CARDIAC CATHETERIZATION N/A 05/08/2016   Procedure: Left Heart Cath and Cors/Grafts Angiography;  Surgeon: Victory LELON Sharps, MD;  Location: [facility] INVASIVE CV LAB;  Service: Cardiovascular;  Laterality: N/A;   CIRCUMCISION N/A 10/07/2019   Procedure: CIRCUMCISION ADULT;  Surgeon: Cam Morene LELON, MD;  Location: [facility] ORS;  Service: Urology;  Laterality: N/A;   CORONARY ARTERY BYPASS GRAFT  2006   x3   ELECTROPHYSIOLOGIC STUDY N/A 08/24/2016   Procedure: SVT Ablation;  Surgeon: Will Gladis Norton, MD;  Location: [facility] INVASIVE CV LAB;  Service: Cardiovascular;  Laterality: N/A;   PACEMAKER IMPLANT N/A 08/23/2023   Procedure: PACEMAKER IMPLANT;  Surgeon: Norton Soyla Gladis, MD;  Location: [facility] INVASIVE CV LAB;  Service: Cardiovascular;  Laterality: N/A;   PROSTATE SURGERY  2008   PTCA     UMBILICAL HERNIA REPAIR N/A 05/26/2022   Procedure: PRIMARY REPAIR OF STRANGULATED UMBILICAL HERNIA WITH PARTIAL OMENTECTOMY;  Surgeon: Aron Shoulders, MD;  Location: [facility] OR;  Service: General;  Laterality: N/A;    Allergies  Allergen Reactions   Shellfish Allergy Shortness Of Breath, Nausea And Vomiting and Rash    Scallops     Fluzone [Influenza Virus Vaccine] Nausea And Vomiting,  Palpitations and Rash    BP 115/65   Ht 5' 8 (1.727 m)   Wt 207 lb (93.9 kg)   BMI 31.47 kg/m       No data to display              No data to display              Objective:  Physical Exam:  Gen: NAD, comfortable in exam room  MSK: Inspection: no swelling, deformity ecchymosis of the  hands Palpation: Palpable nodules at 1st MCP joint bilaterally however no  palpable triggering or tenderness over the flexor tendons. Tender to the [facility] joints bilaterally and right IP joint. No tenderness to MTP joints, carpals, metacarpals. ROM: increased movement at right IP joint Strength: Flexion 5/5, extension 5/5, abduction 5/5, opposition 5/5 Special tests: CMC grind painful  Assessment & Plan:  Patient is a 78 y.o. male here for bilateral hand pain. Suspect he is suffering from bilateral CMC arthritis as well as right IP joint arthritis given his pain with movement and positive CMC grind test. While there was a palpable nodule at the MCP joint, low suspicion for a trigger thumb at this time given lack of tenderness over the flexor tendon and no subjective triggering. Additionally, a similar nodule was palpated on the left hand and he does not endorse any catching on that side. Plan to get bilateral hand x-ray to evaluate for arthritis. He currently is not interested in pursuing joint injections so we will proceed with topical treatments and bracing.   1. CMC Arthritis, Bilateral  IP Arthritis, Right - XR Thumbs Right and Left - Thumb CMC braces provided bilaterally - Topical Voltaren, Arnica as needed for pain - Will follow-up in 4-6 weeks or as needed  Bernardino Grip, MS4 [facility]     Patient seen and evaluated by Bernardino Grip, MS4.  I have seen and examined the patient independently and agree with their assessment as above. Prentice Niece, MD PGY-4, Sports Medicine Fellow [facility]

## 2024-05-27 NOTE — Patient Instructions (Addendum)
 Based on your exam I think your pain is coming from arthritis of the [facility] joints, MCP joints and the IP joint on the right thumb. I don't think this is a trigger finger issue. -I ordered x-rays of both of the thumbs to be done and we can review these at the follow-up.  I recommend using the thumb braces provided today with painful activities.  For pain I would use topical Voltaren (Diclofenac 1%) gel or Arnica gel. Both can be found over the counter at the pharmacy. You can apply this every 6 hours.  Let's follow-up in 4-6 weeks to check in on how this is doing.

## 2024-06-06 NOTE — Progress Notes (Unsigned)
 [facility] OFFICE PROGRESS NOTE  Koirala, Dibas, MD [address] [place] [address]  DIAGNOSIS:  Iron  deficiency anemia secondary to chronic gastrointestinal hemorrhage   PRIOR THERAPY: 1) Iron  infusion with Ferrlecit 250 Mg IV for 4 doses. Last dose was given on 06/09/2023  2) IV iron  infusion with feraheme, last dose on 12/17/23  CURRENT THERAPY: Oral ferrous sulfate  with orange juice as well as oral vitamin B12 1000 mcg p.o. daily and blood transfusions PRN to keep Hbg >8.   INTERVAL HISTORY: Jeremiah [name] 78 y.o. male returns to the clinic today a follow-up visit. The patient was last seen by myself on 05/12/24.   In summary the patient has a history of anemia secondary to GI blood loss.  He is followed by GI.  He saw Dr. Gaile at [facility] who arrange for retrograde device assisted endoscopy and the patient has had improvement in his anemia since that time. An area of bleeding was identified and cauterized.   The patient receives IV iron  infusions on an as-needed basis most recent being on December 17, 2023.  He did have a prior reaction with significant dizziness for which he went to ER. Therefore, we try not to give him IV iron  unless needed.    When I saw him in the winter the patient I strongly encouraged him to also take a p.o. iron  supplement for maintenance. It would be ideal to avoid IV iron  unless needed. He had some GI upset with taking BID so we discussed other iron  formulations and taking once a day. He is taking slow release iron  which he tolerates better.   He saw his cardiologist who would like his Hbg to be consisently above 13 for a few months before considering putting him back on a blood thinner and considering him for stents due to his claudication which is his main concern.   He is also taking aspirin  and B12 supplements. His iron  levels were good last month, but he is concerned about recent fluctuations in his hemoglobin levels. No  visible bleeding, lightheadedness, or syncope. His stools are light tan. He reports good energy levels but is limited by claudication.   He has gained weight recently, attributing it to dietary changes, including increased consumption of sweets prepared by his partner. He wants to return to healthier eating habits, including consuming more iron -rich foods like beets, kale, and spinach, which he had previously included in his diet.   He is here for evaluation and repeat blood work   MEDICAL HISTORY: Past Medical History:  Diagnosis Date   A-fib (HCC)    Acute on chronic systolic (congestive) heart failure (HCC) 06/10/2017   Acute pulmonary edema (HCC)    Anemia    Aortic valve sclerosis 12/2018   Noted on ECHO   Arthritis    CAD (coronary artery disease)    a. 2006: CABG in 2006 with LIMA-LAD, SVG-OM1, and SVG-RPDA   Cardiomegaly 12/2018   Stable, noted on CXR   Carpal tunnel syndrome    Right   Chronic pain 03/21/2016   Colon cancer (HCC) 2006   COPD (chronic obstructive pulmonary disease) (HCC)    Diabetes mellitus without complication (HCC)    DVT (deep venous thrombosis) (HCC)    Right   Essential hypertension 03/21/2016   GERD (gastroesophageal reflux disease)    History of blood transfusion    History of Clostridioides difficile infection    History of prostate cancer 03/21/2016   History of  PSVT (paroxysmal supraventricular tachycardia)    HLD (hyperlipidemia)    HTN (hypertension)    Hx of CABG 2006   Hypercholesteremia 03/21/2016   Hypothyroidism    LAE (left atrial enlargement) 12/2018   Severe, Noted on ECHO   LVH (left ventricular hypertrophy) 12/2018   Mild, Noted on ECHO   Medication management 03/21/2016   Mitral annular calcification 12/2018   with mild MS, Noted on ECHO   Morbid obesity (HCC) 03/21/2016   Myocardial infarct (HCC)    OSA (obstructive sleep apnea) 03/21/2016   uses oxygen  at night time   Pain in right ankle and joints of right foot  03/21/2016   Paronychia 03/21/2016   Phimosis    Pneumonia    Primary insomnia 03/21/2016   Prostate cancer (HCC) 2008   Pulmonary hypertension (HCC) 12/2018   Moderate, Noted on ECHO   Tobacco dependence 03/21/2016   Tricuspid regurgitation 12/2018   Mild, Noted on ECHO    ALLERGIES:  is allergic to shellfish allergy and fluzone [influenza virus vaccine].  MEDICATIONS:  Current Outpatient Medications  Medication Sig Dispense Refill   aspirin  EC 81 MG tablet Take 1 tablet (81 mg total) by mouth daily. Swallow whole. 90 tablet 3   atorvastatin  (LIPITOR ) 80 MG tablet Take 80 mg by mouth daily.     benzonatate  (TESSALON ) 200 MG capsule Take 1 capsule (200 mg total) by mouth 3 (three) times daily as needed. 45 capsule 3   budesonide  (PULMICORT ) 0.5 MG/2ML nebulizer solution Take 2 mLs (0.5 mg total) by nebulization 2 (two) times daily. 120 mL 11   cetirizine (ZYRTEC) 10 MG tablet Take 10 mg by mouth daily.     colchicine  0.6 MG tablet Take 0.6 mg by mouth daily as needed (Gout).     dicyclomine  (BENTYL ) 10 MG capsule Take 10 mg by mouth 3 (three) times daily before meals. 30 minutes before eating     ezetimibe  (ZETIA ) 10 MG tablet Take 1 tablet (10 mg total) by mouth daily. 90 tablet 3   FeFum-FePoly-FA-B Cmp-C-Biot (INTEGRA PLUS ) CAPS Take 1 capsule by mouth every morning. 30 capsule 2   ferrous sulfate  325 (65 FE) MG EC tablet Take 325 mg by mouth daily with breakfast.     fluticasone  (FLONASE ) 50 MCG/ACT nasal spray SHAKE LIQUID AND USE 1 SPRAY IN EACH NOSTRIL DAILY (Patient taking differently: Place 1 spray into both nostrils daily.) 16 g 2   formoterol  (PERFOROMIST ) 20 MCG/2ML nebulizer solution Take 2 mLs (20 mcg total) by nebulization 2 (two) times daily. 120 mL 6   furosemide  (LASIX ) 40 MG tablet Take 2 tablets (80 mg total) by mouth daily. (Patient taking differently: Take 80 mg by mouth 2 (two) times daily.) 30 tablet 0   glimepiride (AMARYL) 2 MG tablet Take 4 mg by mouth every  morning.     levothyroxine  (SYNTHROID , LEVOTHROID) 88 MCG tablet Take 88 mcg by mouth daily before breakfast. Take on an empty stomach     metFORMIN (GLUCOPHAGE-XR) 500 MG 24 hr tablet Take 2,000 mg by mouth daily.     omeprazole (PRILOSEC) 40 MG capsule Take 40 mg by mouth daily.     Potassium Chloride  ER 20 MEQ TBCR Take 1 tablet by mouth daily.     revefenacin  (YUPELRI ) 175 MCG/3ML nebulizer solution Take 3 mLs (175 mcg total) by nebulization daily. 90 mL 5   spironolactone  (ALDACTONE ) 25 MG tablet Take 1 tablet (25 mg total) by mouth daily. 30 tablet 0   No  current facility-administered medications for this visit.    SURGICAL HISTORY:  Past Surgical History:  Procedure Laterality Date   ANKLE SURGERY Right 12/2013   CARDIAC CATHETERIZATION N/A 05/08/2016   Procedure: Left Heart Cath and Cors/Grafts Angiography;  Surgeon: Victory LELON Sharps, MD;  Location: [facility] INVASIVE CV LAB;  Service: Cardiovascular;  Laterality: N/A;   CIRCUMCISION N/A 10/07/2019   Procedure: CIRCUMCISION ADULT;  Surgeon: Cam Morene LELON, MD;  Location: [facility] ORS;  Service: Urology;  Laterality: N/A;   CORONARY ARTERY BYPASS GRAFT  2006   x3   ELECTROPHYSIOLOGIC STUDY N/A 08/24/2016   Procedure: SVT Ablation;  Surgeon: Will Gladis Norton, MD;  Location: [facility] INVASIVE CV LAB;  Service: Cardiovascular;  Laterality: N/A;   PACEMAKER IMPLANT N/A 08/23/2023   Procedure: PACEMAKER IMPLANT;  Surgeon: Norton Soyla Gladis, MD;  Location: [facility] INVASIVE CV LAB;  Service: Cardiovascular;  Laterality: N/A;   PROSTATE SURGERY  2008   PTCA     UMBILICAL HERNIA REPAIR N/A 05/26/2022   Procedure: PRIMARY REPAIR OF STRANGULATED UMBILICAL HERNIA WITH PARTIAL OMENTECTOMY;  Surgeon: Aron Shoulders, MD;  Location: [facility] OR;  Service: General;  Laterality: N/A;    REVIEW OF SYSTEMS:   Review of Systems  Constitutional: Negative for appetite change, chills, fatigue, fever and unexpected weight change.  HENT: Negative for mouth sores, nosebleeds, sore  throat and trouble swallowing.   Eyes: Negative for eye problems and icterus.  Respiratory: Negative for cough, hemoptysis, shortness of breath and wheezing.   Cardiovascular: Negative for chest pain and leg swelling.  Gastrointestinal:  Negative for abdominal pain, constipation, nausea and vomiting.  Genitourinary: Negative for bladder incontinence, difficulty urinating, dysuria, frequency and hematuria.   Musculoskeletal: Positive for claudication. Negative for back pain, gait problem, neck pain and neck stiffness.  Skin: Negative for itching and rash.  Neurological: Negative for dizziness, extremity weakness, gait problem, headaches, light-headedness and seizures.  Hematological: Negative for adenopathy. Does not bleed easily. Positive for bruising.  Psychiatric/Behavioral: Negative for confusion, depression and sleep disturbance. The patient is not nervous/anxious.   PHYSICAL EXAMINATION:  There were no vitals taken for this visit.  ECOG PERFORMANCE STATUS: 0  Physical Exam  Constitutional: Oriented to person, place, and time and well-developed, well-nourished, and in no distress.  HENT:  Head: Normocephalic and atraumatic.  Mouth/Throat: Oropharynx is clear and moist. No oropharyngeal exudate.  Eyes: Conjunctivae are normal. Right eye exhibits no discharge. Left eye exhibits no discharge. No scleral icterus.  Neck: Normal range of motion. Neck supple.  Cardiovascular: Normal rate, regular rhythm, faint murmur noted in the 2nd right intercostal space and intact distal pulses.   Pulmonary/Chest: Effort normal. Quiet breath sounds bilaterally. No respiratory distress. No wheezes. No rales.  Abdominal: Soft. Bowel sounds are normal. Exhibits no distension and no mass. There is no tenderness.  Musculoskeletal: Normal range of motion. Exhibits no edema.  Lymphadenopathy:    No cervical adenopathy.  Neurological: Alert and oriented to person, place, and time. Exhibits normal muscle tone.  Gait normal. Coordination normal.  Skin: Skin is warm and dry. No rash noted. Not diaphoretic. No erythema. No pallor.  Psychiatric: Mood, memory and judgment normal.  Vitals reviewed.  LABORATORY DATA: Lab Results  Component Value Date   WBC 4.8 05/12/2024   HGB 13.1 05/12/2024   HCT 39.3 05/12/2024   MCV 91.0 05/12/2024   PLT 174 05/12/2024      Chemistry      Component Value Date/Time   NA 139 02/03/2024 0941  NA 138 11/12/2023 1027   K 4.4 02/03/2024 0941   CL 99 02/03/2024 0941   CO2 32 02/03/2024 0941   BUN 19 02/03/2024 0941   BUN 19 11/12/2023 1027   CREATININE 0.98 02/03/2024 0941      Component Value Date/Time   CALCIUM  9.7 02/03/2024 0941   ALKPHOS 88 02/03/2024 0941   AST 16 02/03/2024 0941   ALT 16 02/03/2024 0941   BILITOT 0.4 02/03/2024 0941       RADIOGRAPHIC STUDIES:  DG Finger Thumb Right Result Date: 05/30/2024 CLINICAL DATA:  Bilateral thumb pain EXAM: RIGHT THUMB 2+V COMPARISON:  None Available. FINDINGS: No acute fracture or dislocation. Height loss of the lunate which may be projectional versus secondary to sequela Kienbock's disease. No aggressive osseous lesion. Normal alignment. Mild-moderate osteoarthritis of the first [facility] joint. Minimal osteoarthritis of the scaphotrapeziotrapezoid joint. Generalized osteopenia. Soft tissue are unremarkable. No radiopaque foreign body or soft tissue emphysema. IMPRESSION: 1. Mild-moderate osteoarthritis of the first right [facility] joint. 2. Height loss of the lunate which may be projectional versus secondary to sequela Kienbock's disease. If there is further clinical concern, recommend a dedicated x-ray of the right hand. Electronically Signed   By: Julaine Blanch M.D.   On: 05/30/2024 07:53   DG Finger Thumb Left Result Date: 05/30/2024 CLINICAL DATA:  Bilateral thumb pain EXAM: LEFT THUMB 2+V COMPARISON:  None Available. FINDINGS: No acute fracture or dislocation. No aggressive osseous lesion. Normal alignment.  Severe osteoarthritis of the first [facility] joint. Mild osteoarthritis of the first MCP joint. Generalized osteopenia. Soft tissue are unremarkable. No radiopaque foreign body or soft tissue emphysema. IMPRESSION: 1. Severe osteoarthritis of the first [facility] joint. 2. Mild osteoarthritis of the first MCP joint. Electronically Signed   By: Julaine Blanch M.D.   On: 05/30/2024 07:50   CUP PACEART REMOTE DEVICE CHECK Result Date: 05/26/2024 PPM Scheduled remote reviewed. Normal device function.  Presenting rhythm: AP-AS/VS-VP. 16 AMS episodes, longest 27 min 50 sec on6/17/25 at 5:51 pm, EGMs c/w 1:1 SVT and A>V conduction, avg V rates 70-152 bpm, Burden < 1%, OAC contraindicated per Epic. Next remote 91 days. [facility], [facility]    ASSESSMENT/PLAN:  This is a very pleasant 78 year old Caucasian male with iron  deficiency anemia secondary to presumed GI blood loss.   He is receiving frequent blood transfusions.  He also receives IV iron  infusions on an as-needed basis the most recent on 12/17/23 with Feraheme. He had some dizziness with this and Dr. Sherrod would consider prednisone  or solumedrol for pre-medications moving forward. We can also consider changing his IV iron  to venofer  200 mg.    Labs were reviewed today.  His hemoglobin is slightly low at 12.4. He does not need a blood transfusion today.  He would consider blood transfusion if his hemoglobin were less than 8.    His ferritin is still pending. His iron  studies are WNL but lower compared to last month. He is going to go back to taking iron  BID because he is really hoping to be eligible for blood thinner and stents. We also discussed increasing his dietary intake of iron  rich food. He was advised to take this with vitamin C   As long as he is stable/normal, we will try to avoid IV iron  unless absolutely needed due to prior intolerance.    He is able to tell when his hemoglobin is low and knows he is always welcome to call us  sooner for same-day lab appointment.   I will place  standing orders for blood bank hold's.   He saw Dr. Gaile at [facility] who found an area of bleeding which was cauterized.   I previously discussed monitoring his labs once every 6-8 weeks but the patient may be resuming his blood thinner soon so they prefer close monitoring with labs and follow up in 1 month.   Since he had some intolerance with fereheme in the past, should he require IV iron , we can consider adding premedications and changing to venofer .   He will continue B12  The patient was advised to call immediately if he has any concerning symptoms in the interval. The patient voices understanding of current disease status and treatment options and is in agreement with the current care plan. All questions were answered. The patient knows to call the clinic with any problems, questions or concerns. We can certainly see the patient much sooner if necessary    No orders of the defined types were placed in this encounter.    The total time spent in the appointment was 20-29 minutes  Talley Casco L Nychelle Cassata, PA-C 06/06/24

## 2024-06-10 ENCOUNTER — Ambulatory Visit: Admitting: Family Medicine

## 2024-06-11 ENCOUNTER — Inpatient Hospital Stay: Attending: Physician Assistant

## 2024-06-11 ENCOUNTER — Encounter: Payer: Self-pay | Admitting: Family Medicine

## 2024-06-11 ENCOUNTER — Inpatient Hospital Stay (HOSPITAL_BASED_OUTPATIENT_CLINIC_OR_DEPARTMENT_OTHER): Admitting: Physician Assistant

## 2024-06-11 ENCOUNTER — Other Ambulatory Visit: Payer: Self-pay

## 2024-06-11 VITALS — BP 143/54 | HR 69 | Temp 98.1°F | Resp 17 | Ht 68.0 in | Wt 217.0 lb

## 2024-06-11 DIAGNOSIS — D500 Iron deficiency anemia secondary to blood loss (chronic): Secondary | ICD-10-CM | POA: Diagnosis not present

## 2024-06-11 DIAGNOSIS — M79644 Pain in right finger(s): Secondary | ICD-10-CM

## 2024-06-11 DIAGNOSIS — D649 Anemia, unspecified: Secondary | ICD-10-CM

## 2024-06-11 LAB — CBC WITH DIFFERENTIAL (CANCER CENTER ONLY)
Abs Immature Granulocytes: 0.03 K/uL (ref 0.00–0.07)
Basophils Absolute: 0 K/uL (ref 0.0–0.1)
Basophils Relative: 1 %
Eosinophils Absolute: 0.1 K/uL (ref 0.0–0.5)
Eosinophils Relative: 3 %
HCT: 36.9 % — ABNORMAL LOW (ref 39.0–52.0)
Hemoglobin: 12.4 g/dL — ABNORMAL LOW (ref 13.0–17.0)
Immature Granulocytes: 1 %
Lymphocytes Relative: 28 %
Lymphs Abs: 1.3 K/uL (ref 0.7–4.0)
MCH: 31.2 pg (ref 26.0–34.0)
MCHC: 33.6 g/dL (ref 30.0–36.0)
MCV: 92.7 fL (ref 80.0–100.0)
Monocytes Absolute: 0.6 K/uL (ref 0.1–1.0)
Monocytes Relative: 12 %
Neutro Abs: 2.6 K/uL (ref 1.7–7.7)
Neutrophils Relative %: 55 %
Platelet Count: 171 K/uL (ref 150–400)
RBC: 3.98 MIL/uL — ABNORMAL LOW (ref 4.22–5.81)
RDW: 14.8 % (ref 11.5–15.5)
WBC Count: 4.6 K/uL (ref 4.0–10.5)
nRBC: 0 % (ref 0.0–0.2)

## 2024-06-11 LAB — FERRITIN: Ferritin: 54 ng/mL (ref 24–336)

## 2024-06-11 LAB — IRON AND IRON BINDING CAPACITY (CC-WL,HP ONLY)
Iron: 85 ug/dL (ref 45–182)
Saturation Ratios: 21 % (ref 17.9–39.5)
TIBC: 398 ug/dL (ref 250–450)
UIBC: 313 ug/dL (ref 117–376)

## 2024-06-11 NOTE — Telephone Encounter (Signed)
 Neeton and Megan -   See note about placing order for wrist x-rays.  Thank you!

## 2024-06-16 ENCOUNTER — Ambulatory Visit: Attending: Cardiology | Admitting: Internal Medicine

## 2024-06-16 ENCOUNTER — Encounter: Payer: Self-pay | Admitting: Internal Medicine

## 2024-06-16 VITALS — BP 110/72 | HR 78 | Ht 68.0 in | Wt 225.6 lb

## 2024-06-16 DIAGNOSIS — I480 Paroxysmal atrial fibrillation: Secondary | ICD-10-CM | POA: Diagnosis not present

## 2024-06-16 DIAGNOSIS — I342 Nonrheumatic mitral (valve) stenosis: Secondary | ICD-10-CM

## 2024-06-16 DIAGNOSIS — Z951 Presence of aortocoronary bypass graft: Secondary | ICD-10-CM | POA: Diagnosis not present

## 2024-06-16 DIAGNOSIS — I739 Peripheral vascular disease, unspecified: Secondary | ICD-10-CM

## 2024-06-16 MED ORDER — CILOSTAZOL 50 MG PO TABS: 50.0000 mg | ORAL_TABLET | Freq: Two times a day (BID) | ORAL | 3 refills | Status: DC

## 2024-06-16 NOTE — Patient Instructions (Addendum)
 Medication Instructions:  START Cilostazole (PLETAL ) 50 mg twice daily  *If you need a refill on your cardiac medications before your next appointment, please call your pharmacy*  Testing/Procedures: In 3 months Your physician has requested that you have a lower or upper extremity arterial duplex. This test is an ultrasound of the arteries in the legs or arms. It looks at arterial blood flow in the legs and arms. Allow one hour for Lower and Upper Arterial scans. There are no restrictions or special instructions.  Please note: We ask at that you not bring children with you during ultrasound (echo/ vascular) testing. Due to room size and safety concerns, children are not allowed in the ultrasound rooms during exams. Our front office staff cannot provide observation of children in our lobby area while testing is being conducted. An adult accompanying a patient to their appointment will only be allowed in the ultrasound room at the discretion of the ultrasound technician under special circumstances. We apologize for any inconvenience.  Follow-Up: At [facility], you and your health needs are our priority.  As part of our continuing mission to provide you with exceptional heart care, our providers are all part of one team.  This team includes your primary Cardiologist (physician) and Advanced Practice Providers or APPs (Physician Assistants and Nurse Practitioners) who all work together to provide you with the care you need, when you need it.  Your next appointment:   With Dr. Court after LE Arterial Doppler (around 3-4 months) With Dr. Mona in 6 months

## 2024-06-16 NOTE — Progress Notes (Signed)
 OFFICE NOTE  Chief Complaint:  Follow-up  Primary Care Physician: Regino Slater, MD  HPI:  Jeremiah [name] is a 78 y.o. 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 He is originally from [place]  and his wife who is accompanying him today is originally from [place] . [name] had an MI in 2006 and underwent coronary artery bypass grafting with a LIMA to LAD, SVG to OM1, and SVG to RPDA. He did fairly well with this however 2015 was having chest discomfort. He underwent a nuclear stress test which showed possible reversible ischemia. Therefore he was referred for cardiac catheterization at the [facility] [place] . That demonstrated all 3 patent bypass grafts with multivessel coronary disease. At that time he was also having episodes of palpitations and a monitor demonstrated PSVT. He was scheduled for possible ablation but is had chronic problems with an ankle fracture and nonhealing wound. He therefore never underwent ablation. He was placed on digoxin  and has been on metoprolol  but stopped the digoxin  at some point in the past. He reports over the past 8-12 months she's had no further palpitations. He also has a history of dyslipidemia, hypothyroidism, and borderline diabetes. Recently he had a sleep study through [facility] physicians which demonstrated severe obstructive sleep apnea and BiPAP was recommended with settings of 19/14 cm water  pressure. Currently he denies any chest pain or worsening shortness of breath. His last echocardiogram from his cardiologist in [place]  was a 2015 which showed an EF of 58% and no significant valvular disease.  07/26/2016  [name] returns today for follow-up. He was recently seen in the emergency department in the beginning of June for chest pain. This was associated with tachycardia and probable SVT. Troponin was noted to be elevated to 0.12. He had signs and symptoms concerning for unstable angina. He was evaluated by my  partners and felt to need cardiac catheterization. Eventually he underwent cardiac catheterization by Dr. Claudene with the results as follows:  Conclusion   LM lesion, 85% stenosed. Ost 1st Mrg lesion, 100% stenosed. SVG was injected . Ost RCA to Prox RCA lesion, 100% stenosed. SVG . Origin to Prox Graft lesion, 40% stenosed. Mid RCA to Dist RCA lesion, 100% stenosed. Prox LAD lesion, 100% stenosed. LIMA .   Patent saphenous vein grafts. SVG to PDA contains eccentric 40% proximal stenosis. SVG to the circumflex is widely patent. LIMA to the LAD is widely patent. Native distal left main contains 75% stenosis. The dominant obtuse marginal branch is totally occluded. The native right coronary is totally occluded. Left ventriculogram demonstrates inferior wall hypokinesis. EF 50%. Recent chest pain in the setting of PSVT with minimal enzyme elevation likely related to demand ischemia in the setting of underlying native coronary disease.   Recommendations: Medical therapy. Management of PSVT with medications versus ablation.   Since discharge she denies any further episodes of tachycardia palpitations. He is already on a high dose of beta blocker. He's had a small amount of weight gain but has stopped smoking.  He plans to start to do more exercise and work on weight loss. I'm concerned about his recurrent palpitations and the fact that asymptomatic and has demand ischemia related to them and bypass graft insufficiency.  06/10/2017  [name] returns today for follow-up. It's been almost a year since I last saw him. He was found to have recurrent symptomatic SVT and underwent comprehensive EP study and was diagnosed with classic AV nodal reentry tachycardia and  subsequently underwent selective radiofrequency ablation by Dr. Inocencio. He says since that time he's had no further episodes of tachypalpitations. Since then he was hospitalized twice, after developing C. difficile colitis and enteritis  with GI bleed in January. Subsequently he developed acute congestive heart failure was found to have a mild reduction in LVEF to 45-50% by echo in March 2018. At that time he was on furosemide  40 mg daily. He had a follow-up with Hao Meng, PA-C in April who noted he was on Lasix  40 mg twice daily, however he reported that he was not taking that to me today. Fact he says he is now off of Lasix . He reports worsening shortness of breath and hasn't fact had a 13 pound weight gain since April. He denies any lower extremity edema. He has been working with Dr. Darlean for his shortness of breath.  07/22/2017  [name] is back today for follow-up. He restarted his Lasix  at my direction his weight is now down from 252-244. He's had significant improvement in swelling and reports some improvement in his breathing however he says it still not good. He does have a mildly reduced EF of 45-50% but also has some chronic lung disease which is likely contributing. He says he gets short of breath while doing certain activities in the yard and then comes inside and uses his oxygen  which improves him almost immediately. He denies any orthopnea. He has not had any recent or productive cough. He has a follow-up visit with his pulmonologist tomorrow. I had ordered lab work including a metabolic profile and BNP which was never obtained.  09/09/2018  [name] is seen today in follow-up.  He reports some improvement in his shortness of breath.  He has joined a gym and now is exercising every day.  Weight however has been stable at 252, actually up 2 pounds from April.  Despite this his breathing has improved somewhat.  He still uses oxygen .  He did have one episode recently where he became nauseated and vomited.  He was [place] [facility] [place] and had been taking antibiotics for a dental infection.  He did undergo CT scan of the head and neck and that demonstrated bilateral carotid artery calcification.  This is not surprising given  his history of coronary artery disease and prior CABG however he has not had prior carotid Dopplers.    02/18/2019  Mr. Wainright is seen today in follow-up.  Recently he says has had some improvement in shortness of breath.  Dr. Darlean, his pulmonologist had started him on a new nebulizer called revefenacin .  His most recent hemoglobin A1c was 7.1.  Weight is down about 4 pounds.  He denies any new chest pain.  He has had some recent anemia and work-up is apparently underway for this.  He is tentatively scheduled for an endoscopy on April 16 with Dr. Saintclair.  He is concerned because of his pulmonary status that he may be at high risk of decompensation with the procedure.  He also has follow-up with his PCP in the near future.  09/14/2019  Mr. Baughman returns today for follow-up.  He continues to do well.  His shortness of breath has improved which I think was both believed due to a combination of pulmonary optimization as well as improvement in LV function.  Recently his echo in fact showed LVEF has normalized to 60 to 65%.  He remains euvolemic and is actually 2 pounds lighter than he was seen about 8  months ago.  Hemoglobin A1c has been reasonably well-controlled however his lipids are still uncontrolled.  His total cholesterol is 211, triglycerides 494, HDL 32 and LDL of 114.  His target LDL is less than 70 and his elevated triglycerides also put him at increased cardiovascular risk.  03/02/2020  Mr. Klingel is seen today in follow-up.  He denies any chest pain but does have some shortness of breath.  Is been some weight gain.  He reports less physical activity has been struggling with a long recovery after undergoing circumcision for issues with phimosis.  Its unfortunate that he had a difficult recovery but seems to be doing better.  Blood pressure was well controlled today.  His EKG demonstrated some worsening inferior and lateral T wave inversions.  This was previously seen however not as significant.  His  last heart cath was in 2017 which did show a patent SVG to OM and occluded SVG to right coronary.  He is anticipating restarting an exercise program and working with a Psychologist, educational.  02/24/2021  Mr. Fraticelli is seen today in follow-up.  Overall he says he is feeling reasonably well.  He has had some shortness of breath.  He is scheduled for a sleep study.  We discussed this because of the previously mentioned issues and that is actually scheduled tonight.  He also has a history of heart failure with diabetes which is not at target A1c of 8.3.  Lipids recently at assess her total cholesterol 106, HDL 33, LDL 49 and triglycerides 134.  07/18/2021  Mr. Viets is seen today for routine follow-up.  He seems to be doing well on the Jardiance  at 25 mg daily.  Hemoglobin A1c is now come down to 7.3.  Blood pressure is well controlled.  EKG is unchanged showing sinus rhythm with some lateral T wave changes.  He has noted some lower blood pressures at home.  He says around the mid 90s over 31s.  Blood pressure here was elevated 130/90 however more typical readings are between 90 and 100 systolic.  He says he is asymptomatic for this.  He is on very little medicine.  1 option would be to consider backing off on the bisoprolol .  Cholesterol is very well controlled as of February total 106, HDL 33, LDL 49 triglycerides 134.  I do not believe it is too low.  Would recommend continue his current regimen.  02/02/2022  Mr. Merriott is seen today in follow-up.  Overall he says he is doing very well.  Weight has been fairly stable.  Blood pressure is well controlled.  He has been undergoing some rehab for a frozen shoulder.  He denies any worsening shortness of breath.  Recent labs showed total cholesterol 153, HDL 33 triglycerides 291 and LDL 73.  A1c 7.3%.  EKG shows a sinus rhythm with some inferolateral T wave changes (unchanged from prior EKG in 2022).  09/07/2022  Kazimir is seen today in follow-up.  Overall he seems to be  doing pretty well.  He is actually lost quite a bit of weight recently.  He is down to 215 pounds but was as high as 245 back in March of this past year.  He has made some dietary changes.  He is also had some issues with his GI system and is having an upcoming endoscopy.  Blood pressure is well controlled.  He denies any chest pain.  He did have a recent syncopal episode.  He said he was working on his house and  had been not sleeping well and thought he was dehydrated and simply passed out.  It does not sound like there was a clear prodrome prior to that but if he feels it was likely related to exhaustion.  He has never passed out before.  He has not had any subsequent episodes.  12/27/2022  Maleek returns today for follow-up.  Recently he is followed up with Dr. Darlean.  Has been having issues with reflux and had EGD and colonoscopy.  He was noted to have I believe 24 lesions of the esophagus, likely Barrett's esophagus.  He has had vomiting and is scheduled for a gastric emptying study.  Despite this, he is also made significant dietary changes and reports in the past 3 months he is lost about 35 pounds.  It was noted that he is markedly hypotensive today with blood pressure 82/46.  He was also seen recently as mentioned with Dr. Darlean and had a CT scan of the chest.  Besides emphysema this demonstrated aortic valve calcification as well as pulmonary artery dilatation suggestive of pulmonary hypertension.  He has been short of breath but uses home oxygen .  He also tells me that he has an upcoming visit for periodontal surgery with Dr. Bari.  02/18/2023  Jayzon is seen today as an add-on visit.  Unfortunately he was just hospitalized over the past week with dehydration, nausea vomiting and diarrhea as well as abdominal pain.  He was seen in [facility] ER and stabilized and then presented to his PCP was sent to [facility].  There he was admitted and found to have norovirus gastroenteritis in addition to what was described  as an ischemic colitis.  His medications were held including his diuretics and blood pressure medicines to allow higher blood pressure.  I had recently cut back on his blood pressure medicine in January, stopping his losartan .  Since discharge she has had some persistent diarrhea.  He was noted to be somewhat anemic with a hemoglobin of 11.4.  His echo was read by myself and surprisingly indicated that he has moderate to severe mitral stenosis with elevated left atrial pressure.  This is likely contributing to some of his symptoms of shortness of breath.  Today he says he feels quite fatigued and is very symptomatic.  He is lost a significant amount of weight down another 15 pounds since I saw him in January.  In addition to the mitral stenosis, his EKG today shows atrial fibrillation.  This is a new diagnosis for him.  It does not appear that he had an EKG at [facility] that I could find but did have an EKG at [facility] which showed sinus rhythm with marked sinus arrhythmia although I would say it is questionably A-fib.  03/28/2023  Chinonso returns today for follow-up.  Unfortunately he has been hospitalized at least twice since I last saw him at [facility].  He has had issues with recurrent diarrhea and then unfortunately GI bleeding.  He was found to have mesenteric ischemia and underwent left brachial artery cutdown and mesenteric artery and celiac artery stenting.  Fortunately, he was noted to be in sinus rhythm when he presented although we had been working him up for possible TEE cardioversion.  Ultimately cardiology was consulted and they recommended a TEE to further evaluate his mitral valve disease.  Eliquis  was held but after stenting ultimately aspirin  and Eliquis  were restarted.  He is TEE demonstrated normal LVEF with heavily calcified mitral valve that was thought to be degenerative with  mild MR and a mean gradient of 5.8 at 65 bpm consistent with severe mitral stenosis with possibly low gradient.  There was  also diffuse calcification of the aortic valve which planimeter to 1.2 cm with a peak velocity of 3.2 m/s thought to be moderately stenotic.  He required 8 units of packed red blood cells and after discharge remained anemic with a hemoglobin of 7.8.  He remains on oxygen  and was recently started on iron .  We discussed today about possible management options.  I do feel that mitral stenosis is likely a major risk factor for worsening shortness of breath as well as development of recurrent atrial fibrillation and stroke.  They had discussed the possibility of a Watchman device with him however with his peripheral arterial disease and mitral stenosis I think this would be a suboptimal procedure.  Ideally 1 would consider mitral valve replacement surgery with left atrial appendage closure surgically, and possibly consider aortic valve intervention.  However his comorbidities, recent GI bleeding and mesenteric ischemia may make him a prohibitive surgical candidate.  Additionally he has had prior CABG and this would be a redo sternotomy.  06/26/2023  Gonsalo returns today for follow-up.  Again he was hospitalized at this time at [facility] for severe anemia and acute on chronic heart failure.  I did see him as I was on service in the hospital.  We participated in a diuresis.  Workup revealed severe anemia.  He was given IV iron .  His blood thinners have been stopped.  Subsequently after discharge and substantial diuresis of 15 pounds, he was discharged on 80 mg Lasix  daily and Aldactone .  Since follow-up his weight has remained stable or is actually a few pounds lighter.  He recently saw Dr. Darlean with pulmonary who made an adjustment changing his metoprolol  over to bisoprolol .  He has noted no change in his symptoms with this.  He was also seen at [facility] for capsule endoscopy.  This suggested that there was some small bowel AVM however options to try to alleviate bleeding from this are limited.  They had suggested  possibly a referral to [facility] to see about a balloon procedure.  Today he is in good spirits.  He reports his symptoms are pretty stable.  He has had some improvement in nasal allergy symptoms with Flonase  and is requesting a refill for that.  He is scheduled to see hematology tomorrow and my partners in October and November to address the Watchman device and possible mitral valve intervention.  11/11/2023  Calix is seen today in follow-up.  Unfortunately last few months have been quite difficult for him.  He had redeveloped GI bleeding and became quite anemic with hemoglobin down into the sixes.  He was again transfused.  He has been established with oncology and is undergoing therapies there to help improve his anemia.  He apparently was found to have bleeding from a small branch of the SMA based on capsule endoscopy.  This is not something that can be apparently easily treated however there is some ability to treat it but requires apparently specialized equipment that could be only at [place] or in [place].  Because of ongoing bleeding risk, he has not been a candidate for anticoagulation.  He has been having significant pain in both legs particularly when walking.  Dopplers in October 2024 shows severe stenosis of the right superficial femoral artery and severe stenosis of the above-the-knee popliteal artery on the left.  He is frustrated that this continues  however I discussed with Dr. Court today and percutaneous intervention for this is not possible to tolerate dual antiplatelet therapy for about 3 months.  Currently he is on aspirin  low-dose however would need to be on Plavix  as well.  Of note his blood pressure is quite low today at 80/49.  I wonder if this may be related to recent bleeding and or dehydration.  02/11/2024  Mccoy returns today for follow-up.  Overall he says he is feeling pretty well.  He had a procedure done at [facility] to locate an area of GI bleeding which was cauterized.  Since then it  seems that his hemoglobin has remained stable if not improved.  He is working with Dr. Gatha in the cancer center and receiving IV iron .  His most recent hemoglobin was 11.9 on March 3.  He still reports what sounds like claudication symptoms.  He saw Dr. Court recently and it was recommended to wait on additional 6 months to make sure that his hemoglobin was stable before considering a peripheral intervention.  He also had a repeat echo last month.  This was reviewed by Dr. Wendel kidding LVEF 55 to 60% with severe left atrial enlargement and moderate to severe mitral stenosis, possibly somewhat worse.  06/16/2024  Randie returns today for follow-up.  So far has been fairly stable since I last saw him in March.  His hemoglobin also has been stable ranging between 12.5 and 13.5.  His iron  stores have been normal.  He has had no recurrent bleeding.  He does have symptoms of what sound like claudication.  He says he can walk a decent distance and was able to walk down the hallway here without stopping but noted when he walks a longer distance such as from the entrance of [employer] to the back of the building he has to stop because of pain in his legs.  Breathing is also limiting it note he has noted to not always use the oxygen  regularly when he exerts himself.  Today his SpO2 was at 88% off of oxygen  but quickly climbed up to 97%.  He also has been struggling with some trigger finger recently and other issues.  He had a repeat echo this year which showed worsening mitral stenosis however he has not been considered a candidate for therapy for that.  PMHx:  Past Medical History:  Diagnosis Date   A-fib (HCC)    Acute on chronic systolic (congestive) heart failure (HCC) 06/10/2017   Acute pulmonary edema (HCC)    Anemia    Aortic valve sclerosis 12/2018   Noted on ECHO   Arthritis    CAD (coronary artery disease)    a. 2006: CABG in 2006 with LIMA-LAD, SVG-OM1, and SVG-RPDA   Cardiomegaly 12/2018    Stable, noted on CXR   Carpal tunnel syndrome    Right   Chronic pain 03/21/2016   Colon cancer (HCC) 2006   COPD (chronic obstructive pulmonary disease) (HCC)    Diabetes mellitus without complication (HCC)    DVT (deep venous thrombosis) (HCC)    Right   Essential hypertension 03/21/2016   GERD (gastroesophageal reflux disease)    History of blood transfusion    History of Clostridioides difficile infection    History of prostate cancer 03/21/2016   History of PSVT (paroxysmal supraventricular tachycardia)    HLD (hyperlipidemia)    HTN (hypertension)    Hx of CABG 2006   Hypercholesteremia 03/21/2016   Hypothyroidism    LAE (left atrial  enlargement) 12/2018   Severe, Noted on ECHO   LVH (left ventricular hypertrophy) 12/2018   Mild, Noted on ECHO   Medication management 03/21/2016   Mitral annular calcification 12/2018   with mild MS, Noted on ECHO   Morbid obesity (HCC) 03/21/2016   Myocardial infarct (HCC)    OSA (obstructive sleep apnea) 03/21/2016   uses oxygen  at night time   Pain in right ankle and joints of right foot 03/21/2016   Paronychia 03/21/2016   Phimosis    Pneumonia    Primary insomnia 03/21/2016   Prostate cancer (HCC) 2008   Pulmonary hypertension (HCC) 12/2018   Moderate, Noted on ECHO   Tobacco dependence 03/21/2016   Tricuspid regurgitation 12/2018   Mild, Noted on ECHO    Past Surgical History:  Procedure Laterality Date   ANKLE SURGERY Right 12/2013   CARDIAC CATHETERIZATION N/A 05/08/2016   Procedure: Left Heart Cath and Cors/Grafts Angiography;  Surgeon: Victory LELON Sharps, MD;  Location: [facility] INVASIVE CV LAB;  Service: Cardiovascular;  Laterality: N/A;   CIRCUMCISION N/A 10/07/2019   Procedure: CIRCUMCISION ADULT;  Surgeon: Cam Morene LELON, MD;  Location: [facility] ORS;  Service: Urology;  Laterality: N/A;   CORONARY ARTERY BYPASS GRAFT  2006   x3   ELECTROPHYSIOLOGIC STUDY N/A 08/24/2016   Procedure: SVT Ablation;  Surgeon: Will Gladis Norton,  MD;  Location: [facility] INVASIVE CV LAB;  Service: Cardiovascular;  Laterality: N/A;   PACEMAKER IMPLANT N/A 08/23/2023   Procedure: PACEMAKER IMPLANT;  Surgeon: Norton Soyla Gladis, MD;  Location: [facility] INVASIVE CV LAB;  Service: Cardiovascular;  Laterality: N/A;   PROSTATE SURGERY  2008   PTCA     UMBILICAL HERNIA REPAIR N/A 05/26/2022   Procedure: PRIMARY REPAIR OF STRANGULATED UMBILICAL HERNIA WITH PARTIAL OMENTECTOMY;  Surgeon: Aron Shoulders, MD;  Location: [facility] OR;  Service: General;  Laterality: N/A;    FAMHx:  Family History  Problem Relation Age of Onset   Dementia Mother    Diabetes Sister     SOCHx:   reports that he quit smoking about 7 years ago. His smoking use included cigarettes. He started smoking about 67 years ago. He has a 45 pack-year smoking history. He has never used smokeless tobacco. He reports that he does not currently use alcohol. He reports that he does not use drugs.  ALLERGIES:  Allergies  Allergen Reactions   Shellfish Allergy Shortness Of Breath, Nausea And Vomiting and Rash    Scallops     Fluzone [Influenza Virus Vaccine] Nausea And Vomiting, Palpitations and Rash    ROS: Pertinent items noted in HPI and remainder of comprehensive ROS otherwise negative.  HOME MEDS: Current Outpatient Medications  Medication Sig Dispense Refill   aspirin  EC 81 MG tablet Take 1 tablet (81 mg total) by mouth daily. Swallow whole. 90 tablet 3   atorvastatin  (LIPITOR ) 80 MG tablet Take 80 mg by mouth daily.     budesonide  (PULMICORT ) 0.5 MG/2ML nebulizer solution Take 2 mLs (0.5 mg total) by nebulization 2 (two) times daily. 120 mL 11   cetirizine (ZYRTEC) 10 MG tablet Take 10 mg by mouth daily.     colchicine  0.6 MG tablet Take 0.6 mg by mouth daily as needed (Gout).     dicyclomine  (BENTYL ) 10 MG capsule Take 10 mg by mouth 3 (three) times daily before meals. 30 minutes before eating     ezetimibe  (ZETIA ) 10 MG tablet Take 1 tablet (10 mg total) by mouth daily. 90 tablet  3  FeFum-FePoly-FA-B Cmp-C-Biot (INTEGRA PLUS ) CAPS Take 1 capsule by mouth every morning. 30 capsule 2   ferrous sulfate  325 (65 FE) MG EC tablet Take 325 mg by mouth daily with breakfast.     fluticasone  (FLONASE ) 50 MCG/ACT nasal spray SHAKE LIQUID AND USE 1 SPRAY IN EACH NOSTRIL DAILY 16 g 2   formoterol  (PERFOROMIST ) 20 MCG/2ML nebulizer solution Take 2 mLs (20 mcg total) by nebulization 2 (two) times daily. 120 mL 6   furosemide  (LASIX ) 40 MG tablet Take 2 tablets (80 mg total) by mouth daily. 30 tablet 0   glimepiride (AMARYL) 2 MG tablet Take 4 mg by mouth every morning.     levothyroxine  (SYNTHROID , LEVOTHROID) 88 MCG tablet Take 88 mcg by mouth daily before breakfast. Take on an empty stomach     metFORMIN (GLUCOPHAGE-XR) 500 MG 24 hr tablet Take 2,000 mg by mouth daily.     ondansetron  (ZOFRAN ) 4 MG tablet Take 4 mg by mouth daily.     Potassium Chloride  ER 20 MEQ TBCR Take 1 tablet by mouth daily.     revefenacin  (YUPELRI ) 175 MCG/3ML nebulizer solution Take 3 mLs (175 mcg total) by nebulization daily. 90 mL 5   spironolactone  (ALDACTONE ) 25 MG tablet Take 1 tablet (25 mg total) by mouth daily. 30 tablet 0   benzonatate  (TESSALON ) 200 MG capsule Take 1 capsule (200 mg total) by mouth 3 (three) times daily as needed. (Patient not taking: Reported on 06/16/2024) 45 capsule 3   omeprazole (PRILOSEC) 40 MG capsule Take 40 mg by mouth daily. (Patient not taking: Reported on 06/16/2024)     No current facility-administered medications for this visit.    LABS/IMAGING: No results found for this or any previous visit (from the past 48 hours).  No results found.  WEIGHTS: Wt Readings from Last 3 Encounters:  06/16/24 225 lb 9.6 oz (102.3 kg)  06/11/24 217 lb (98.4 kg)  05/27/24 207 lb (93.9 kg)    VITALS: BP 110/72   Pulse 78   Ht 5' 8 (1.727 m)   Wt 225 lb 9.6 oz (102.3 kg)   SpO2 97%   BMI 34.30 kg/m   EXAM: General appearance: alert, no distress, and pale Neck: no  carotid bruit, no JVD, and thyroid  not enlarged, symmetric, no tenderness/mass/nodules Lungs: diminished breath sounds bilaterally Heart: irregularly irregular rhythm, S1, S2 normal, and systolic murmur: holosystolic 3/6, blowing at 2nd right intercostal space Abdomen: Soft, mild tenderness to palpation Extremities: extremities normal, atraumatic, no cyanosis or edema Pulses: 2+ and symmetric Skin: Pale, cool, dry Neurologic: Grossly normal Psych: Pleasant  EKG: Deferred  ASSESSMENT: Paroxysmal atrial fibrillation -CHA2DS2-VASc score of 4, currently in sinus rhythm Moderate aortic stenosis and severe mitral stenosis Ischemic colitis with recent GI bleeding Recent SVT which was symptomatic-repeat cardiac catheterization (05/2016) shows patent grafts, status post ablation of AV nodal reentry tachycardia (08/2016) CAD status post three-vessel CABG in 2006 (LIMA to LAD, SVG to OM1, SVG to RPDA) in [place]  Patent bypass grafts by cath in 2015 Dyslipidemia on Lipitor  OSA-BiPAP recommended COPD Chronic systolic congestive heart failure-LVEF 45-50% -improved to 60 to 65% (12/2018)  Phimosis Syncope PAD with symptoms of claudication  PLAN: 1.   Mr. Sahlin has now had a stable hemoglobin for at least 4 months.  He continues to have symptoms of claudication which seem to be worse with more moderate exertion probably walking 500 yards or more.  He was found to have moderate SFA disease and I reviewed those Dopplers with  Dr. Court today.  He is considered a less desirable candidate for intervention given his other comorbidities and history of bleeding.  Dr. Court suggested a trial with Pletal  50 mg twice daily.  He would like repeat Dopplers in 3 months and follow-up with Dr. Court at that time.  Mr. Blatz is agreeable to that.  I will plan to see him back in 6 months or sooner as necessary.  He does have a history of heart failure but his LVEF has normalized.  No recent atrial  fibrillation.  Vinie KYM Maxcy, MD, [facility], FNLA, FACP  [place]  [facility]  Medical Director of the [facility] Lipid Disorders &  Cardiovascular Risk Reduction Clinic Diplomate of the American Board of Clinical Lipidology Attending Cardiologist  Direct Dial: [phone]  Fax: [phone]  Website:  [email or web address] Vinie BROCKS Skarlett Sedlacek 06/16/2024, 1:35 PM

## 2024-06-29 NOTE — Progress Notes (Signed)
 Remote pacemaker transmission.

## 2024-06-29 NOTE — Addendum Note (Signed)
 Addended by: TAWNI DRILLING D on: 06/29/2024 01:53 PM   Modules accepted: Orders

## 2024-07-07 ENCOUNTER — Telehealth: Payer: Self-pay | Admitting: Internal Medicine

## 2024-07-07 NOTE — Telephone Encounter (Signed)
*  STAT* If patient is at the pharmacy, call can be transferred to refill team.   1. Which medications need to be refilled? (please list name of each medication and dose if known) cetirizine  (ZYRTEC ) 10 MG tablet  fluticasone  (FLONASE ) 50 MCG/ACT nasal spray   2. Would you like to learn more about the convenience, safety, & potential cost savings by using the [facility]?    3. Are you open to using the [facility] (Type [facility].  ).   4. Which pharmacy/location (including street and city if local pharmacy) is medication to be sent to? [id number] - [place], [place] - [address] AT [facility] OF [address]    5. Do they need a 30 day or 90 day supply? 90 day

## 2024-07-07 NOTE — Assessment & Plan Note (Deleted)
 secondary to presumed GI blood loss.   He is receiving frequent blood transfusions.  He also receives IV iron  infusions on an as-needed basis the most recent on 12/17/23 with Feraheme. He had some dizziness with this and Dr. Sherrod would consider prednisone  or solumedrol for pre-medications moving forward. We can also consider changing his IV iron  to venofer  200 mg.    Labs were reviewed today.  His hemoglobin is slightly low at 12.4. He does not need a blood transfusion today.  He would consider blood transfusion if his hemoglobin were less than 8.     His ferritin is still pending. His iron  studies are WNL but lower compared to last month. He is going to go back to taking iron  BID because he is really hoping to be eligible for blood thinner and stents. We also discussed increasing his dietary intake of iron  rich food. He was advised to take this with vitamin C    As long as he is stable/normal, we will try to avoid IV iron  unless absolutely needed due to prior intolerance.    He is able to tell when his hemoglobin is low and knows he is always welcome to call us  sooner for same-day lab appointment.  I will place standing orders for blood bank hold's.   He saw Dr. Gaile at [facility] who found an area of bleeding which was cauterized.

## 2024-07-07 NOTE — Progress Notes (Deleted)
 Patient Care Team: Regino Slater, MD as PCP - General (Family Medicine) Inocencio Soyla Lunger, MD as PCP - Electrophysiology (Cardiology) Thukkani, Arun K, MD as PCP - Cardiology (Cardiology)  Clinic Day:  07/07/2024  Referring physician: Regino Slater, MD  ASSESSMENT & PLAN:   Assessment & Plan: Iron  deficiency anemia  secondary to presumed GI blood loss.   He is receiving frequent blood transfusions.  He also receives IV iron  infusions on an as-needed basis the most recent on 12/17/23 with Feraheme. He had some dizziness with this and Dr. Sherrod would consider prednisone  or solumedrol for pre-medications moving forward. We can also consider changing his IV iron  to venofer  200 mg.    Labs were reviewed today.  His hemoglobin is slightly low at 12.4. He does not need a blood transfusion today.  He would consider blood transfusion if his hemoglobin were less than 8.     His ferritin is still pending. His iron  studies are WNL but lower compared to last month. He is going to go back to taking iron  BID because he is really hoping to be eligible for blood thinner and stents. We also discussed increasing his dietary intake of iron  rich food. He was advised to take this with vitamin C    As long as he is stable/normal, we will try to avoid IV iron  unless absolutely needed due to prior intolerance.    He is able to tell when his hemoglobin is low and knows he is always welcome to call us  sooner for same-day lab appointment.  I will place standing orders for blood bank hold's.   He saw Dr. Gaile at [facility] who found an area of bleeding which was cauterized.    The patient understands the plans discussed today and is in agreement with them.  He knows to contact our office if he develops concerns prior to his next appointment.  I provided *** minutes of face-to-face time during this encounter and > 50% was spent counseling as documented under my assessment and plan.    Powell FORBES Lessen, NP  [facility] - A DEPT OF [facility] [address] [place] [address] Dept: [phone] Dept Fax: [phone]   No orders of the defined types were placed in this encounter.     CHIEF COMPLAINT:  CC: Iron  deficiency anemia due to chronic blood loss  Current Treatment: Oral ferrous sulfate  with orange juice B12 1000 mcg daily, blood transfusions as needed to keep hemoglobin >8.  INTERVAL HISTORY:  Loran is here today for repeat clinical assessment.  He last A, PA on 06/11/2024.  Most recent iron  infusion on 12/17/2023.  IV iron  given only as needed as patient has had significant allergic reactions.  He denies fevers or chills. He denies pain. His appetite is good. His weight {Weight change:10426}.  I have reviewed the past medical history, past surgical history, social history and family history with the patient and they are unchanged from previous note.  ALLERGIES:  is allergic to shellfish allergy and fluzone [influenza virus vaccine].  MEDICATIONS:  Current Outpatient Medications  Medication Sig Dispense Refill   aspirin  EC 81 MG tablet Take 1 tablet (81 mg total) by mouth daily. Swallow whole. 90 tablet 3   atorvastatin  (LIPITOR ) 80 MG tablet Take 80 mg by mouth daily.     benzonatate  (TESSALON ) 200 MG capsule Take 1 capsule (200 mg total) by mouth 3 (three) times daily  as needed. (Patient not taking: Reported on 06/16/2024) 45 capsule 3   budesonide  (PULMICORT ) 0.5 MG/2ML nebulizer solution Take 2 mLs (0.5 mg total) by nebulization 2 (two) times daily. 120 mL 11   cetirizine  (ZYRTEC ) 10 MG tablet Take 10 mg by mouth daily.     cilostazol  (PLETAL ) 50 MG tablet Take 1 tablet (50 mg total) by mouth 2 (two) times daily. 180 tablet 3   colchicine  0.6 MG tablet Take 0.6 mg by mouth daily as needed (Gout).     dicyclomine  (BENTYL ) 10 MG capsule Take 10 mg by mouth 3 (three) times daily before meals. 30 minutes before eating      ezetimibe  (ZETIA ) 10 MG tablet Take 1 tablet (10 mg total) by mouth daily. 90 tablet 3   FeFum-FePoly-FA-B Cmp-C-Biot (INTEGRA PLUS ) CAPS Take 1 capsule by mouth every morning. 30 capsule 2   ferrous sulfate  325 (65 FE) MG EC tablet Take 325 mg by mouth daily with breakfast.     fluticasone  (FLONASE ) 50 MCG/ACT nasal spray SHAKE LIQUID AND USE 1 SPRAY IN EACH NOSTRIL DAILY 16 g 2   formoterol  (PERFOROMIST ) 20 MCG/2ML nebulizer solution Take 2 mLs (20 mcg total) by nebulization 2 (two) times daily. 120 mL 6   furosemide  (LASIX ) 40 MG tablet Take 2 tablets (80 mg total) by mouth daily. 30 tablet 0   glimepiride (AMARYL) 2 MG tablet Take 4 mg by mouth every morning.     levothyroxine  (SYNTHROID , LEVOTHROID) 88 MCG tablet Take 88 mcg by mouth daily before breakfast. Take on an empty stomach     metFORMIN (GLUCOPHAGE-XR) 500 MG 24 hr tablet Take 2,000 mg by mouth daily.     omeprazole (PRILOSEC) 40 MG capsule Take 40 mg by mouth daily. (Patient not taking: Reported on 06/16/2024)     ondansetron  (ZOFRAN ) 4 MG tablet Take 4 mg by mouth daily.     Potassium Chloride  ER 20 MEQ TBCR Take 1 tablet by mouth daily.     revefenacin  (YUPELRI ) 175 MCG/3ML nebulizer solution Take 3 mLs (175 mcg total) by nebulization daily. 90 mL 5   spironolactone  (ALDACTONE ) 25 MG tablet Take 1 tablet (25 mg total) by mouth daily. 30 tablet 0   No current facility-administered medications for this visit.    HISTORY OF PRESENT ILLNESS:   Oncology History   No history exists.      REVIEW OF SYSTEMS:   Constitutional: Denies fevers, chills or abnormal weight loss Eyes: Denies blurriness of vision Ears, nose, mouth, throat, and face: Denies mucositis or sore throat Respiratory: Denies cough, dyspnea or wheezes Cardiovascular: Denies palpitation, chest discomfort or lower extremity swelling Gastrointestinal:  Denies nausea, heartburn or change in bowel habits Skin: Denies abnormal skin rashes Lymphatics: Denies new  lymphadenopathy or easy bruising Neurological:Denies numbness, tingling or new weaknesses Behavioral/Psych: Mood is stable, no new changes  All other systems were reviewed with the patient and are negative.   VITALS:  There were no vitals taken for this visit.  Wt Readings from Last 3 Encounters:  06/16/24 225 lb 9.6 oz (102.3 kg)  06/11/24 217 lb (98.4 kg)  05/27/24 207 lb (93.9 kg)    There is no height or weight on file to calculate BMI.  Performance status (ECOG): {CHL ONC [id number]  PHYSICAL EXAM:   GENERAL:alert, no distress and comfortable SKIN: skin color, texture, turgor are normal, no rashes or significant lesions EYES: normal, Conjunctiva are pink and non-injected, sclera clear OROPHARYNX:no exudate, no erythema and lips, buccal mucosa,  and tongue normal  NECK: supple, thyroid  normal size, non-tender, without nodularity LYMPH:  no palpable lymphadenopathy in the cervical, axillary or inguinal LUNGS: clear to auscultation and percussion with normal breathing effort HEART: regular rate & rhythm and no murmurs and no lower extremity edema ABDOMEN:abdomen soft, non-tender and normal bowel sounds Musculoskeletal:no cyanosis of digits and no clubbing  NEURO: alert & oriented x 3 with fluent speech, no focal motor/sensory deficits  LABORATORY DATA:  I have reviewed the data as listed    Component Value Date/Time   NA 139 02/03/2024 0941   NA 138 11/12/2023 1027   K 4.4 02/03/2024 0941   CL 99 02/03/2024 0941   CO2 32 02/03/2024 0941   GLUCOSE 191 (H) 02/03/2024 0941   BUN 19 02/03/2024 0941   BUN 19 11/12/2023 1027   CREATININE 0.98 02/03/2024 0941   CALCIUM  9.7 02/03/2024 0941   PROT 7.9 02/03/2024 0941   ALBUMIN 4.6 02/03/2024 0941   AST 16 02/03/2024 0941   ALT 16 02/03/2024 0941   ALKPHOS 88 02/03/2024 0941   BILITOT 0.4 02/03/2024 0941   GFRNONAA >60 02/03/2024 0941   GFRAA >60 10/01/2019 1412    Lab Results  Component Value Date   SPEP .  06/27/2023    Lab Results  Component Value Date   WBC 4.6 06/11/2024   NEUTROABS 2.6 06/11/2024   HGB 12.4 (L) 06/11/2024   HCT 36.9 (L) 06/11/2024   MCV 92.7 06/11/2024   PLT 171 06/11/2024      Chemistry      Component Value Date/Time   NA 139 02/03/2024 0941   NA 138 11/12/2023 1027   K 4.4 02/03/2024 0941   CL 99 02/03/2024 0941   CO2 32 02/03/2024 0941   BUN 19 02/03/2024 0941   BUN 19 11/12/2023 1027   CREATININE 0.98 02/03/2024 0941      Component Value Date/Time   CALCIUM  9.7 02/03/2024 0941   ALKPHOS 88 02/03/2024 0941   AST 16 02/03/2024 0941   ALT 16 02/03/2024 0941   BILITOT 0.4 02/03/2024 0941       RADIOGRAPHIC STUDIES: I have personally reviewed the radiological images as listed and agreed with the findings in the report. No results found.

## 2024-07-08 ENCOUNTER — Telehealth: Payer: Self-pay

## 2024-07-08 ENCOUNTER — Inpatient Hospital Stay: Admitting: Nurse Practitioner

## 2024-07-08 ENCOUNTER — Inpatient Hospital Stay: Attending: Physician Assistant

## 2024-07-08 DIAGNOSIS — D500 Iron deficiency anemia secondary to blood loss (chronic): Secondary | ICD-10-CM

## 2024-07-08 MED ORDER — CETIRIZINE HCL 10 MG PO TABS: 10.0000 mg | ORAL_TABLET | Freq: Every day | ORAL | 3 refills | Status: AC

## 2024-07-08 MED ORDER — FLUTICASONE PROPIONATE 50 MCG/ACT NA SUSP: 1.0000 | Freq: Every day | NASAL | 2 refills | Status: AC

## 2024-07-08 NOTE — Telephone Encounter (Signed)
 Called patient due to not showing up for his 1030 lab app and his 1100 app with Powell Lessen NP. Patient stated he thought his app was on 8/07, forwarded his call to scheduler Deshjna to have him rescheduled.

## 2024-07-09 ENCOUNTER — Telehealth: Payer: Self-pay | Admitting: Physician Assistant

## 2024-07-09 NOTE — Telephone Encounter (Signed)
 The patient called to reschedule canceled appointments. The patient requested his labs be sooner than the follow up. The patient is aware of the appointments made.

## 2024-07-13 ENCOUNTER — Inpatient Hospital Stay: Attending: Physician Assistant

## 2024-07-13 DIAGNOSIS — K922 Gastrointestinal hemorrhage, unspecified: Secondary | ICD-10-CM | POA: Diagnosis not present

## 2024-07-13 DIAGNOSIS — D500 Iron deficiency anemia secondary to blood loss (chronic): Secondary | ICD-10-CM | POA: Diagnosis present

## 2024-07-13 DIAGNOSIS — D649 Anemia, unspecified: Secondary | ICD-10-CM

## 2024-07-13 LAB — CBC WITH DIFFERENTIAL (CANCER CENTER ONLY)
Abs Immature Granulocytes: 0.03 K/uL (ref 0.00–0.07)
Basophils Absolute: 0 K/uL (ref 0.0–0.1)
Basophils Relative: 0 %
Eosinophils Absolute: 0.2 K/uL (ref 0.0–0.5)
Eosinophils Relative: 3 %
HCT: 38.4 % — ABNORMAL LOW (ref 39.0–52.0)
Hemoglobin: 12.7 g/dL — ABNORMAL LOW (ref 13.0–17.0)
Immature Granulocytes: 1 %
Lymphocytes Relative: 24 %
Lymphs Abs: 1.3 K/uL (ref 0.7–4.0)
MCH: 31.8 pg (ref 26.0–34.0)
MCHC: 33.1 g/dL (ref 30.0–36.0)
MCV: 96 fL (ref 80.0–100.0)
Monocytes Absolute: 0.6 K/uL (ref 0.1–1.0)
Monocytes Relative: 11 %
Neutro Abs: 3.2 K/uL (ref 1.7–7.7)
Neutrophils Relative %: 61 %
Platelet Count: 175 K/uL (ref 150–400)
RBC: 4 MIL/uL — ABNORMAL LOW (ref 4.22–5.81)
RDW: 15 % (ref 11.5–15.5)
WBC Count: 5.2 K/uL (ref 4.0–10.5)
nRBC: 0 % (ref 0.0–0.2)

## 2024-07-13 LAB — IRON AND IRON BINDING CAPACITY (CC-WL,HP ONLY)
Iron: 89 ug/dL (ref 45–182)
Saturation Ratios: 23 % (ref 17.9–39.5)
TIBC: 382 ug/dL (ref 250–450)
UIBC: 293 ug/dL (ref 117–376)

## 2024-07-13 LAB — FERRITIN: Ferritin: 40 ng/mL (ref 24–336)

## 2024-07-13 LAB — SAMPLE TO BLOOD BANK

## 2024-07-18 NOTE — Progress Notes (Signed)
 Chief Complaint  Patient presents with  . Finger Laceration    Pt has a laceration to his right thumb, pt was removing caulking and cut his thumb today.   Patient has a past medical history of Atrial fibrillation    (CMD), Chronic hypoxemic respiratory failure    (CMD), COPD (chronic obstructive pulmonary disease)    (CMD), Diverticular hemorrhage, Heart failure    (CMD), CABG, and Mesenteric artery stenosis (CMD).  HPI History of Present Illness This is a 78 year old male with a history of bleeding issues and previous surgery on his right hand for trigger finger, presenting with a laceration on his right thumb.  The patient sustained the injury while cutting caulk out of some baseboard with a razor tipped box butter, resulting in a slip that caused the laceration. Initially, he did not believe medical care was necessary due to the initial shock and blood loss. However, upon reconsideration, he decided to seek medical attention. The laceration is located over the knuckle of his right thumb. Bleeding is controlled. He is able to move the thumb fully and grip his hand. He reports no current pain and is right-handed.  He has a significant medical history including a 63-day hospital stay due to bleeding issues, with hemoglobin levels fluctuating between 5 and 6 for 2.5 years, causing fatigue. He frequently visited the ER for blood transfusions, receiving up to 8 units at a time, which required a 2-week hospital stay. At [facility], he underwent a procedure to cauterize bleeding areas in his intestines and colon. He also had 2 stents placed to improve blood flow and oxygenation. Currently, he needs 2 stents in his legs but must wait 6 months for the bleeding to stop before starting blood thinners to prevent clotting during the procedure.  PAST SURGICAL HISTORY: Surgery on right hand for trigger finger.  .  Patient is currently on no medication and has no medical condition that may reduce immunity or increase  the risk for serious infection.  Tobacco Use History[1]  Review of Systems Review of systems is otherwise negative except as noted in the HPI and Assessment/MDM  Physical Exam  Vitals:   07/18/24 1349  BP: 119/75  Pulse: 85  Resp: 20  Temp: 98.8 F (37.1 C)  TempSrc: Tympanic  SpO2: 96%  Weight: 97.5 kg (215 lb)     Physical Exam: Physical Exam Constitutional:      General: Patient is not in acute distress.    Appearance: Normal appearance.  Neurological:     General: No focal deficit present.     Mental Status: alert and oriented to person, place, and time.  HENT:     Eyes:     Conjunctiva/sclera: Conjunctivae normal; sclera clear.     Pupils: Pupils are equal, round, and reactive to light. Cardiovascular:     Heart: Regular rate    No Lower extremity edema noted Pulmonary:     Effort: Pulmonary effort is normal. No conversational dyspnea. No respiratory distress.     Breath sounds: No audible wheezing Musculoskeletal:        General: No apparent joint or muscle limitation.     Neck:  No rigidity; supple.  Fingers and thumb of right hand with full ROM, including against resistance. Psychiatric:        Mood and Affect: Normal affect. Mood good. Appears to have good judgement.  Skin:    General: Skin is warm and dry. No rash. DIP right thumb with tiny avulsed laceration over knuckle;  superficial.  Laceration repair  Date/Time: 07/18/2024 1:40 PM  Performed by: Sonny Jenkins Rinks, NP Authorized by: Sonny Jenkins Rinks, NP   Consent:    Consent obtained:  Verbal   Risks, benefits, and alternatives were discussed: yes     Risks discussed:  Infection, need for additional repair, poor cosmetic result, poor wound healing and pain   Alternatives discussed:  No treatment, delayed treatment, observation and referral Universal protocol:    Procedure explained and questions answered to patient or proxy's satisfaction: yes     Site/side marked: yes     Immediately prior  to procedure, a time out was called: yes     Patient identity confirmed:  Verbally with patient and provided demographic data Anesthesia:    Anesthesia method:  None Laceration details:    Location:  Finger   Finger location:  R thumb   Length (cm):  0.8   Depth (mm):  1 Exploration:    Limited defect created (wound extended): no     Hemostasis achieved with:  Direct pressure   Wound exploration: entire depth of wound visualized     Wound extent: no areolar tissue violation noted, no fascia violation noted, no foreign bodies/material noted, no muscle damage noted, no nerve damage noted, no tendon damage noted, no underlying fracture noted and no vascular damage noted     Contaminated: no   Treatment:    Area cleansed with:  Povidone-iodine   Amount of cleaning:  Standard   Visualized foreign bodies/material removed: no     Debridement:  None   Undermining:  None   Scar revision: no   Skin repair:    Repair method:  Tissue adhesive Approximation:    Approximation:  Close Repair type:    Repair type:  Simple Post-procedure details:    Dressing:  Non-adherent dressing and splint for protection   Procedure completion:  Tolerated well, no immediate complications    No orders to display   New Medications Ordered This Visit  Medications  . cephALEXin  (KEFLEX ) 500 mg capsule    Sig: Take 1 capsule (500 mg total) by mouth 3 (three) times a day for 5 days.    Dispense:  15 capsule    Refill:  0    No results found for this visit on 07/18/24. 1. Laceration of skin of right thumb, initial encounter  cephALEXin  (KEFLEX ) 500 mg capsule     Assessment & Plan Initial Assessment: 78 year old male with a tiny avulsed laceration over the knuckle of the right thumb. Tetanus immunization status is current.  UC Course: Laceration closed with Dermabond medical glue Dressed with nonadherent bandage, gauze, and Coban with finger splint Keflex  prescribed due to risk for infection  Final  Assessment: Laceration closed with Dermabond glue, dressed with bandage, gauze, and splint. Keflex  prescribed for infection risk.  Clinical Impression: - Laceration on the right thumb  Disposition: Discharge/Home: Wound closed with glue, bandaged, and splinted. Advised to avoid excessive thumb use for 3 days. Do not submerge in water . Do not apply ointment.    I estimate there is LOW risk for RETAINED FOREIGN BODY, CONTAMINATED OR INFECTED WOUND, OR INVOLVEMENT OF ANY VITAL STRUCTURES INCLUDING NERVE, ARTERY OR MUSCLE.   I reviewed home care instructions for the wound.  Do not submerge wound in water . Cleanse daily with mild soap and water . Change your dressing (if present) when soiled. Let air dry when able. Once wound has crusted over and is no longer open, bleeding or draining, wound  dressings are no longer necessary.  - Reviewed concerning signs and symptoms of infection including fever, swelling, erythema, increased pain, purulent drainage, or other new symptom. - Take Ibuprofen 600 mg q6h prn pain. - Patient handouts reviewing information discussed was provided. - Follow-up for wound reassessment if needed.  If concerning symptoms manifest, they should return to clinic, seek evaluation with their primary care doctor, or present to the nearest emergency department for management immediately.  Plan: Urgent Care Disposition: Home Care  Anticipated course of illness has been reviewed. If not improving as discussed or in the expected timeframe, patient has been advised to seek follow up with their PCP, UC or the ED.  Management of their illness and symptoms has been discussed. Patient was given verbal and written instructions on symptoms that necessitate return to the UC/ED, and instructed to f/u with UC or PCP if not improving.  Anticipated follow up, or next steps in care was reviewed with the patient.  They were provided with written information for reference.  Patient aware they  will be contacted regarding results of any pending laboratory results ONLY if the results are abnormal. If abnormal, appropriate treatment measures will be taken based upon results.  Patient education (verbal/handout) given on diagnosis, pathophysiology, treatment of diagnosis, medications & side effects, exercises, the follow-up plan and supportives measures.  We discussed risks and side effects of medications, and also discussed red flags which would warrant immediate follow-up. They should activate EMS, or 911 if red flag symptoms emerge.  Patient and/or parent/guardian (if applicable) agreed with plan and voiced understanding.  No barriers to adherence perceived by myself.  Electronically signed by Sonny Caldron Arledge  Sat 07/18/2024 3:24 PM       [1] Social History Tobacco Use  Smoking Status Former  . Types: Cigars  Smokeless Tobacco Former

## 2024-07-18 NOTE — Progress Notes (Deleted)
 [facility] OFFICE PROGRESS NOTE  Koirala, Dibas, MD [address] [place] [address]  DIAGNOSIS: Iron  deficiency anemia secondary to chronic gastrointestinal hemorrhage   PRIOR THERAPY: 1) Iron  infusion with Ferrlecit 250 Mg IV for 4 doses. Last dose was given on 06/09/2023  2) IV iron  infusion with feraheme, last dose on 12/17/23  CURRENT THERAPY: Oral ferrous sulfate  with orange juice as well as oral vitamin B12 1000 mcg p.o. daily and blood transfusions PRN to keep Hbg >8.   INTERVAL HISTORY: Jeremiah [name] 78 y.o. male returns  to the clinic today a follow-up visit. The patient was last seen by myself on 06/11/24.   In summary the patient has a history of anemia secondary to GI blood loss.  He is followed by GI.  He saw Dr. Gaile at [facility] who arrange for retrograde device assisted endoscopy and the patient has had improvement in his anemia since that time. An area of bleeding was identified and cauterized.    The patient receives IV iron  infusions on an as-needed basis most recent being on December 17, 2023.  He did have a prior reaction with significant dizziness for which he went to ER. Therefore, we try not to give him IV iron  unless needed.     He is taking an iron  supplement with slow release iron . He takes this BID ***GI upset?   He saw his cardiologist who would like his Hbg to be consisently above 13 for a few months before considering putting him back on a blood thinner and considering him for stents due to his claudication which is his main concern. He saw his cardiologist again most recently on 06/16/24 and they determined that ***tria. Pletal  50 mg BID.    He is also taking aspirin  and B12 supplements. No visible bleeding, lightheadedness, or syncope. His stools are light tan. He reports good energy levels but is limited by claudication.         MEDICAL HISTORY: Past Medical History:  Diagnosis Date   A-fib [facility])    Acute on chronic  systolic (congestive) heart failure (HCC) 06/10/2017   Acute pulmonary edema (HCC)    Anemia    Aortic valve sclerosis 12/2018   Noted on ECHO   Arthritis    CAD (coronary artery disease)    a. 2006: CABG in 2006 with LIMA-LAD, SVG-OM1, and SVG-RPDA   Cardiomegaly 12/2018   Stable, noted on CXR   Carpal tunnel syndrome    Right   Chronic pain 03/21/2016   Colon cancer (HCC) 2006   COPD (chronic obstructive pulmonary disease) (HCC)    Diabetes mellitus without complication (HCC)    DVT (deep venous thrombosis) (HCC)    Right   Essential hypertension 03/21/2016   GERD (gastroesophageal reflux disease)    History of blood transfusion    History of Clostridioides difficile infection    History of prostate cancer 03/21/2016   History of PSVT (paroxysmal supraventricular tachycardia)    HLD (hyperlipidemia)    HTN (hypertension)    Hx of CABG 2006   Hypercholesteremia 03/21/2016   Hypothyroidism    LAE (left atrial enlargement) 12/2018   Severe, Noted on ECHO   LVH (left ventricular hypertrophy) 12/2018   Mild, Noted on ECHO   Medication management 03/21/2016   Mitral annular calcification 12/2018   with mild MS, Noted on ECHO   Morbid obesity (HCC) 03/21/2016   Myocardial infarct (HCC)    OSA (obstructive sleep apnea) 03/21/2016  uses oxygen  at night time   Pain in right ankle and joints of right foot 03/21/2016   Paronychia 03/21/2016   Phimosis    Pneumonia    Primary insomnia 03/21/2016   Prostate cancer (HCC) 2008   Pulmonary hypertension (HCC) 12/2018   Moderate, Noted on ECHO   Tobacco dependence 03/21/2016   Tricuspid regurgitation 12/2018   Mild, Noted on ECHO    ALLERGIES:  is allergic to shellfish allergy and fluzone [influenza virus vaccine].  MEDICATIONS:  Current Outpatient Medications  Medication Sig Dispense Refill   aspirin  EC 81 MG tablet Take 1 tablet (81 mg total) by mouth daily. Swallow whole. 90 tablet 3   atorvastatin  (LIPITOR ) 80 MG tablet  Take 80 mg by mouth daily.     benzonatate  (TESSALON ) 200 MG capsule Take 1 capsule (200 mg total) by mouth 3 (three) times daily as needed. (Patient not taking: Reported on 06/16/2024) 45 capsule 3   budesonide  (PULMICORT ) 0.5 MG/2ML nebulizer solution Take 2 mLs (0.5 mg total) by nebulization 2 (two) times daily. 120 mL 11   cetirizine  (ZYRTEC ) 10 MG tablet Take 1 tablet (10 mg total) by mouth daily. 90 tablet 3   cilostazol  (PLETAL ) 50 MG tablet Take 1 tablet (50 mg total) by mouth 2 (two) times daily. 180 tablet 3   colchicine  0.6 MG tablet Take 0.6 mg by mouth daily as needed (Gout).     dicyclomine  (BENTYL ) 10 MG capsule Take 10 mg by mouth 3 (three) times daily before meals. 30 minutes before eating     ezetimibe  (ZETIA ) 10 MG tablet Take 1 tablet (10 mg total) by mouth daily. 90 tablet 3   FeFum-FePoly-FA-B Cmp-C-Biot (INTEGRA PLUS ) CAPS Take 1 capsule by mouth every morning. 30 capsule 2   ferrous sulfate  325 (65 FE) MG EC tablet Take 325 mg by mouth daily with breakfast.     fluticasone  (FLONASE ) 50 MCG/ACT nasal spray Place 1 spray into both nostrils daily. 16 g 2   formoterol  (PERFOROMIST ) 20 MCG/2ML nebulizer solution Take 2 mLs (20 mcg total) by nebulization 2 (two) times daily. 120 mL 6   furosemide  (LASIX ) 40 MG tablet Take 2 tablets (80 mg total) by mouth daily. 30 tablet 0   glimepiride (AMARYL) 2 MG tablet Take 4 mg by mouth every morning.     levothyroxine  (SYNTHROID , LEVOTHROID) 88 MCG tablet Take 88 mcg by mouth daily before breakfast. Take on an empty stomach     metFORMIN (GLUCOPHAGE-XR) 500 MG 24 hr tablet Take 2,000 mg by mouth daily.     omeprazole (PRILOSEC) 40 MG capsule Take 40 mg by mouth daily. (Patient not taking: Reported on 06/16/2024)     ondansetron  (ZOFRAN ) 4 MG tablet Take 4 mg by mouth daily.     Potassium Chloride  ER 20 MEQ TBCR Take 1 tablet by mouth daily.     revefenacin  (YUPELRI ) 175 MCG/3ML nebulizer solution Take 3 mLs (175 mcg total) by nebulization  daily. 90 mL 5   spironolactone  (ALDACTONE ) 25 MG tablet Take 1 tablet (25 mg total) by mouth daily. 30 tablet 0   No current facility-administered medications for this visit.    SURGICAL HISTORY:  Past Surgical History:  Procedure Laterality Date   ANKLE SURGERY Right 12/2013   CARDIAC CATHETERIZATION N/A 05/08/2016   Procedure: Left Heart Cath and Cors/Grafts Angiography;  Surgeon: Victory LELON Sharps, MD;  Location: [facility] INVASIVE CV LAB;  Service: Cardiovascular;  Laterality: N/A;   CIRCUMCISION N/A 10/07/2019   Procedure: CIRCUMCISION  ADULT;  Surgeon: Cam Morene ORN, MD;  Location: [facility] ORS;  Service: Urology;  Laterality: N/A;   CORONARY ARTERY BYPASS GRAFT  2006   x3   ELECTROPHYSIOLOGIC STUDY N/A 08/24/2016   Procedure: SVT Ablation;  Surgeon: Will Gladis Norton, MD;  Location: [facility] INVASIVE CV LAB;  Service: Cardiovascular;  Laterality: N/A;   PACEMAKER IMPLANT N/A 08/23/2023   Procedure: PACEMAKER IMPLANT;  Surgeon: Norton Soyla Gladis, MD;  Location: [facility] INVASIVE CV LAB;  Service: Cardiovascular;  Laterality: N/A;   PROSTATE SURGERY  2008   PTCA     UMBILICAL HERNIA REPAIR N/A 05/26/2022   Procedure: PRIMARY REPAIR OF STRANGULATED UMBILICAL HERNIA WITH PARTIAL OMENTECTOMY;  Surgeon: Aron Shoulders, MD;  Location: [facility] OR;  Service: General;  Laterality: N/A;    REVIEW OF SYSTEMS:   Review of Systems  Constitutional: Negative for appetite change, chills, fatigue, fever and unexpected weight change.  HENT:   Negative for mouth sores, nosebleeds, sore throat and trouble swallowing.   Eyes: Negative for eye problems and icterus.  Respiratory: Negative for cough, hemoptysis, shortness of breath and wheezing.   Cardiovascular: Negative for chest pain and leg swelling.  Gastrointestinal: Negative for abdominal pain, constipation, diarrhea, nausea and vomiting.  Genitourinary: Negative for bladder incontinence, difficulty urinating, dysuria, frequency and hematuria.   Musculoskeletal: Negative  for back pain, gait problem, neck pain and neck stiffness.  Skin: Negative for itching and rash.  Neurological: Negative for dizziness, extremity weakness, gait problem, headaches, light-headedness and seizures.  Hematological: Negative for adenopathy. Does not bruise/bleed easily.  Psychiatric/Behavioral: Negative for confusion, depression and sleep disturbance. The patient is not nervous/anxious.     PHYSICAL EXAMINATION:  There were no vitals taken for this visit.  ECOG PERFORMANCE STATUS: {CHL ONC ECOG [id number]  Physical Exam  Constitutional: Oriented to person, place, and time and well-developed, well-nourished, and in no distress. No distress.  HENT:  Head: Normocephalic and atraumatic.  Mouth/Throat: Oropharynx is clear and moist. No oropharyngeal exudate.  Eyes: Conjunctivae are normal. Right eye exhibits no discharge. Left eye exhibits no discharge. No scleral icterus.  Neck: Normal range of motion. Neck supple.  Cardiovascular: Normal rate, regular rhythm, normal heart sounds and intact distal pulses.   Pulmonary/Chest: Effort normal and breath sounds normal. No respiratory distress. No wheezes. No rales.  Abdominal: Soft. Bowel sounds are normal. Exhibits no distension and no mass. There is no tenderness.  Musculoskeletal: Normal range of motion. Exhibits no edema.  Lymphadenopathy:    No cervical adenopathy.  Neurological: Alert and oriented to person, place, and time. Exhibits normal muscle tone. Gait normal. Coordination normal.  Skin: Skin is warm and dry. No rash noted. Not diaphoretic. No erythema. No pallor.  Psychiatric: Mood, memory and judgment normal.  Vitals reviewed.  LABORATORY DATA: Lab Results  Component Value Date   WBC 5.2 07/13/2024   HGB 12.7 (L) 07/13/2024   HCT 38.4 (L) 07/13/2024   MCV 96.0 07/13/2024   PLT 175 07/13/2024      Chemistry      Component Value Date/Time   NA 139 02/03/2024 0941   NA 138 11/12/2023 1027   K 4.4  02/03/2024 0941   CL 99 02/03/2024 0941   CO2 32 02/03/2024 0941   BUN 19 02/03/2024 0941   BUN 19 11/12/2023 1027   CREATININE 0.98 02/03/2024 0941      Component Value Date/Time   CALCIUM  9.7 02/03/2024 0941   ALKPHOS 88 02/03/2024 0941   AST 16 02/03/2024 0941  ALT 16 02/03/2024 0941   BILITOT 0.4 02/03/2024 0941       RADIOGRAPHIC STUDIES:  No results found.   ASSESSMENT/PLAN:  This is a very pleasant 78 year old Caucasian male with iron  deficiency anemia secondary to presumed GI blood loss.   He is receiving frequent blood transfusions.  He also receives IV iron  infusions on an as-needed basis the most recent on 12/17/23 with Feraheme. He had some dizziness with this and Dr. Sherrod would consider prednisone  or solumedrol for pre-medications moving forward. We can also consider changing his IV iron  to venofer  200 mg.    Labs were reviewed today.  His hemoglobin is slightly low at ***. He does not need a blood transfusion today.  He would consider blood transfusion if his hemoglobin were less than 8.     His ferritin is still pending. ***His iron  studies are WNL but lower compared to last month. He is going to go back to taking iron  BID because he is really hoping to be eligible for blood thinner and stents. We also discussed increasing his dietary intake of iron  rich food. He was advised to take this with vitamin C***    As long as he is stable/normal, we will try to avoid IV iron  unless absolutely needed due to prior intolerance.    He is able to tell when his hemoglobin is low and knows he is always welcome to call us  sooner for same-day lab appointment.  I will place standing orders for blood bank hold's.***   He saw Dr[facility] Gaile at [facility] who found an area of bleeding which was cauterized.   ***monitor closely since recently restarted blood thinner.    Since he had some intolerance with fereheme in the past, should he require IV iron , we can consider adding premedications  and changing to venofer .     The patient was advised to call immediately if he has any concerning symptoms in the interval. The patient voices understanding of current disease status and treatment options and is in agreement with the current care plan. All questions were answered. The patient knows to call the clinic with any problems, questions or concerns. We can certainly see the patient much sooner if necessary  No orders of the defined types were placed in this encounter.    I spent {CHL ONC TIME VISIT - DTPQU:[id number]} counseling the patient face to face. The total time spent in the appointment was {CHL ONC TIME VISIT - DTPQU:[id number]}.  Arnett Galindez L Ramzi Brathwaite, PA-C 07/18/24

## 2024-07-22 ENCOUNTER — Ambulatory Visit: Admitting: Physician Assistant

## 2024-07-22 ENCOUNTER — Telehealth: Payer: Self-pay | Admitting: *Deleted

## 2024-07-22 NOTE — Telephone Encounter (Signed)
 PC to patient regarding missed appointment this morning, he states he left several messages stating he was unable to come.  Informed patient our scheduling department will contact him to reschedule, he verbalizes understanding.  Scheduling message sent.

## 2024-07-22 NOTE — Progress Notes (Unsigned)
 [facility] OFFICE PROGRESS NOTE  Koirala, Dibas, MD [address] [place] [address]  DIAGNOSIS: Iron  deficiency anemia secondary to chronic gastrointestinal hemorrhage   PRIOR THERAPY: 1) Iron  infusion with Ferrlecit 250 Mg IV for 4 doses. Last dose was given on 06/09/2023  2) IV iron  infusion with feraheme, last dose on 12/17/23  CURRENT THERAPY: Oral ferrous sulfate  with orange juice as well as oral vitamin B12 1000 mcg p.o. daily and blood transfusions PRN to keep Hbg >8.   INTERVAL HISTORY: Jeremiah [name] 78 y.o. male returns  to the clinic today a follow-up visit. The patient was last seen by myself on 06/11/24.   In summary the patient has a history of anemia secondary to GI blood loss.  He is followed by GI.  He saw Dr. Gaile at [facility] who arrange for retrograde device assisted endoscopy and the patient has had improvement in his anemia since that time. An area of bleeding was identified and cauterized.    The patient receives IV iron  infusions on an as-needed basis most recent being on December 17, 2023.  He did have a prior reaction with significant dizziness for which he went to ER. Therefore, we try not to give him IV iron  unless needed.     He is taking an iron  supplement with slow release iron . He takes this BID ***GI upset?   He saw his cardiologist who would like his Hbg to be consisently above 13 for a few months before considering putting him back on a blood thinner and considering him for stents due to his claudication which is his main concern. He saw his cardiologist again most recently on 06/16/24 and they determined that ***tria. Pletal  50 mg BID.    He is also taking aspirin  and B12 supplements. No visible bleeding, lightheadedness, or syncope. His stools are light tan. He reports good energy levels but is limited by claudication.         MEDICAL HISTORY: Past Medical History:  Diagnosis Date   A-fib [facility])    Acute on chronic  systolic (congestive) heart failure (HCC) 06/10/2017   Acute pulmonary edema (HCC)    Anemia    Aortic valve sclerosis 12/2018   Noted on ECHO   Arthritis    CAD (coronary artery disease)    a. 2006: CABG in 2006 with LIMA-LAD, SVG-OM1, and SVG-RPDA   Cardiomegaly 12/2018   Stable, noted on CXR   Carpal tunnel syndrome    Right   Chronic pain 03/21/2016   Colon cancer (HCC) 2006   COPD (chronic obstructive pulmonary disease) (HCC)    Diabetes mellitus without complication (HCC)    DVT (deep venous thrombosis) (HCC)    Right   Essential hypertension 03/21/2016   GERD (gastroesophageal reflux disease)    History of blood transfusion    History of Clostridioides difficile infection    History of prostate cancer 03/21/2016   History of PSVT (paroxysmal supraventricular tachycardia)    HLD (hyperlipidemia)    HTN (hypertension)    Hx of CABG 2006   Hypercholesteremia 03/21/2016   Hypothyroidism    LAE (left atrial enlargement) 12/2018   Severe, Noted on ECHO   LVH (left ventricular hypertrophy) 12/2018   Mild, Noted on ECHO   Medication management 03/21/2016   Mitral annular calcification 12/2018   with mild MS, Noted on ECHO   Morbid obesity (HCC) 03/21/2016   Myocardial infarct (HCC)    OSA (obstructive sleep apnea) 03/21/2016  uses oxygen  at night time   Pain in right ankle and joints of right foot 03/21/2016   Paronychia 03/21/2016   Phimosis    Pneumonia    Primary insomnia 03/21/2016   Prostate cancer (HCC) 2008   Pulmonary hypertension (HCC) 12/2018   Moderate, Noted on ECHO   Tobacco dependence 03/21/2016   Tricuspid regurgitation 12/2018   Mild, Noted on ECHO    ALLERGIES:  is allergic to shellfish allergy and fluzone [influenza virus vaccine].  MEDICATIONS:  Current Outpatient Medications  Medication Sig Dispense Refill   aspirin  EC 81 MG tablet Take 1 tablet (81 mg total) by mouth daily. Swallow whole. 90 tablet 3   atorvastatin  (LIPITOR ) 80 MG tablet  Take 80 mg by mouth daily.     benzonatate  (TESSALON ) 200 MG capsule Take 1 capsule (200 mg total) by mouth 3 (three) times daily as needed. (Patient not taking: Reported on 06/16/2024) 45 capsule 3   budesonide  (PULMICORT ) 0.5 MG/2ML nebulizer solution Take 2 mLs (0.5 mg total) by nebulization 2 (two) times daily. 120 mL 11   cetirizine  (ZYRTEC ) 10 MG tablet Take 1 tablet (10 mg total) by mouth daily. 90 tablet 3   cilostazol  (PLETAL ) 50 MG tablet Take 1 tablet (50 mg total) by mouth 2 (two) times daily. 180 tablet 3   colchicine  0.6 MG tablet Take 0.6 mg by mouth daily as needed (Gout).     dicyclomine  (BENTYL ) 10 MG capsule Take 10 mg by mouth 3 (three) times daily before meals. 30 minutes before eating     ezetimibe  (ZETIA ) 10 MG tablet Take 1 tablet (10 mg total) by mouth daily. 90 tablet 3   FeFum-FePoly-FA-B Cmp-C-Biot (INTEGRA PLUS ) CAPS Take 1 capsule by mouth every morning. 30 capsule 2   ferrous sulfate  325 (65 FE) MG EC tablet Take 325 mg by mouth daily with breakfast.     fluticasone  (FLONASE ) 50 MCG/ACT nasal spray Place 1 spray into both nostrils daily. 16 g 2   formoterol  (PERFOROMIST ) 20 MCG/2ML nebulizer solution Take 2 mLs (20 mcg total) by nebulization 2 (two) times daily. 120 mL 6   furosemide  (LASIX ) 40 MG tablet Take 2 tablets (80 mg total) by mouth daily. 30 tablet 0   glimepiride (AMARYL) 2 MG tablet Take 4 mg by mouth every morning.     levothyroxine  (SYNTHROID , LEVOTHROID) 88 MCG tablet Take 88 mcg by mouth daily before breakfast. Take on an empty stomach     metFORMIN (GLUCOPHAGE-XR) 500 MG 24 hr tablet Take 2,000 mg by mouth daily.     omeprazole (PRILOSEC) 40 MG capsule Take 40 mg by mouth daily. (Patient not taking: Reported on 06/16/2024)     ondansetron  (ZOFRAN ) 4 MG tablet Take 4 mg by mouth daily.     Potassium Chloride  ER 20 MEQ TBCR Take 1 tablet by mouth daily.     revefenacin  (YUPELRI ) 175 MCG/3ML nebulizer solution Take 3 mLs (175 mcg total) by nebulization  daily. 90 mL 5   spironolactone  (ALDACTONE ) 25 MG tablet Take 1 tablet (25 mg total) by mouth daily. 30 tablet 0   No current facility-administered medications for this visit.    SURGICAL HISTORY:  Past Surgical History:  Procedure Laterality Date   ANKLE SURGERY Right 12/2013   CARDIAC CATHETERIZATION N/A 05/08/2016   Procedure: Left Heart Cath and Cors/Grafts Angiography;  Surgeon: Victory LELON Sharps, MD;  Location: [facility] INVASIVE CV LAB;  Service: Cardiovascular;  Laterality: N/A;   CIRCUMCISION N/A 10/07/2019   Procedure: CIRCUMCISION  ADULT;  Surgeon: Cam Morene ORN, MD;  Location: [facility] ORS;  Service: Urology;  Laterality: N/A;   CORONARY ARTERY BYPASS GRAFT  2006   x3   ELECTROPHYSIOLOGIC STUDY N/A 08/24/2016   Procedure: SVT Ablation;  Surgeon: Will Gladis Norton, MD;  Location: [facility] INVASIVE CV LAB;  Service: Cardiovascular;  Laterality: N/A;   PACEMAKER IMPLANT N/A 08/23/2023   Procedure: PACEMAKER IMPLANT;  Surgeon: Norton Soyla Gladis, MD;  Location: [facility] INVASIVE CV LAB;  Service: Cardiovascular;  Laterality: N/A;   PROSTATE SURGERY  2008   PTCA     UMBILICAL HERNIA REPAIR N/A 05/26/2022   Procedure: PRIMARY REPAIR OF STRANGULATED UMBILICAL HERNIA WITH PARTIAL OMENTECTOMY;  Surgeon: Aron Shoulders, MD;  Location: [facility] OR;  Service: General;  Laterality: N/A;    REVIEW OF SYSTEMS:   Review of Systems  Constitutional: Negative for appetite change, chills, fatigue, fever and unexpected weight change.  HENT:   Negative for mouth sores, nosebleeds, sore throat and trouble swallowing.   Eyes: Negative for eye problems and icterus.  Respiratory: Negative for cough, hemoptysis, shortness of breath and wheezing.   Cardiovascular: Negative for chest pain and leg swelling.  Gastrointestinal: Negative for abdominal pain, constipation, diarrhea, nausea and vomiting.  Genitourinary: Negative for bladder incontinence, difficulty urinating, dysuria, frequency and hematuria.   Musculoskeletal: Negative  for back pain, gait problem, neck pain and neck stiffness.  Skin: Negative for itching and rash.  Neurological: Negative for dizziness, extremity weakness, gait problem, headaches, light-headedness and seizures.  Hematological: Negative for adenopathy. Does not bruise/bleed easily.  Psychiatric/Behavioral: Negative for confusion, depression and sleep disturbance. The patient is not nervous/anxious.     PHYSICAL EXAMINATION:  There were no vitals taken for this visit.  ECOG PERFORMANCE STATUS: {CHL ONC ECOG [id number]  Physical Exam  Constitutional: Oriented to person, place, and time and well-developed, well-nourished, and in no distress. No distress.  HENT:  Head: Normocephalic and atraumatic.  Mouth/Throat: Oropharynx is clear and moist. No oropharyngeal exudate.  Eyes: Conjunctivae are normal. Right eye exhibits no discharge. Left eye exhibits no discharge. No scleral icterus.  Neck: Normal range of motion. Neck supple.  Cardiovascular: Normal rate, regular rhythm, normal heart sounds and intact distal pulses.   Pulmonary/Chest: Effort normal and breath sounds normal. No respiratory distress. No wheezes. No rales.  Abdominal: Soft. Bowel sounds are normal. Exhibits no distension and no mass. There is no tenderness.  Musculoskeletal: Normal range of motion. Exhibits no edema.  Lymphadenopathy:    No cervical adenopathy.  Neurological: Alert and oriented to person, place, and time. Exhibits normal muscle tone. Gait normal. Coordination normal.  Skin: Skin is warm and dry. No rash noted. Not diaphoretic. No erythema. No pallor.  Psychiatric: Mood, memory and judgment normal.  Vitals reviewed.  LABORATORY DATA: Lab Results  Component Value Date   WBC 5.2 07/13/2024   HGB 12.7 (L) 07/13/2024   HCT 38.4 (L) 07/13/2024   MCV 96.0 07/13/2024   PLT 175 07/13/2024      Chemistry      Component Value Date/Time   NA 139 02/03/2024 0941   NA 138 11/12/2023 1027   K 4.4  02/03/2024 0941   CL 99 02/03/2024 0941   CO2 32 02/03/2024 0941   BUN 19 02/03/2024 0941   BUN 19 11/12/2023 1027   CREATININE 0.98 02/03/2024 0941      Component Value Date/Time   CALCIUM  9.7 02/03/2024 0941   ALKPHOS 88 02/03/2024 0941   AST 16 02/03/2024 0941  ALT 16 02/03/2024 0941   BILITOT 0.4 02/03/2024 0941       RADIOGRAPHIC STUDIES:  No results found.   ASSESSMENT/PLAN:  This is a very pleasant 78 year old Caucasian male with iron  deficiency anemia secondary to presumed GI blood loss.   He is receiving frequent blood transfusions.  He also receives IV iron  infusions on an as-needed basis the most recent on 12/17/23 with Feraheme. He had some dizziness with this and Dr. Sherrod would consider prednisone  or solumedrol for pre-medications moving forward. We can also consider changing his IV iron  to venofer  200 mg.    Labs were reviewed today.  His hemoglobin is slightly low at ***. He does not need a blood transfusion today.  He would consider blood transfusion if his hemoglobin were less than 8.     His ferritin is still pending. ***His iron  studies are WNL but lower compared to last month. He is going to go back to taking iron  BID because he is really hoping to be eligible for blood thinner and stents. We also discussed increasing his dietary intake of iron  rich food. He was advised to take this with vitamin C***    As long as he is stable/normal, we will try to avoid IV iron  unless absolutely needed due to prior intolerance.    He is able to tell when his hemoglobin is low and knows he is always welcome to call us  sooner for same-day lab appointment.  I will place standing orders for blood bank hold's.***   He saw Dr. Gaile at [facility] who found an area of bleeding which was cauterized.   ***monitor closely since recently restarted blood thinner.    Since he had some intolerance with fereheme in the past, should he require IV iron , we can consider adding premedications  and changing to venofer .     The patient was advised to call immediately if he has any concerning symptoms in the interval. The patient voices understanding of current disease status and treatment options and is in agreement with the current care plan. All questions were answered. The patient knows to call the clinic with any problems, questions or concerns. We can certainly see the patient much sooner if necessary  No orders of the defined types were placed in this encounter.    I spent {CHL ONC TIME VISIT - DTPQU:[id number]} counseling the patient face to face. The total time spent in the appointment was {CHL ONC TIME VISIT - DTPQU:[id number]}.  Hilmar Moldovan L Rejina Odle, PA-C 07/22/24

## 2024-07-24 ENCOUNTER — Inpatient Hospital Stay (HOSPITAL_BASED_OUTPATIENT_CLINIC_OR_DEPARTMENT_OTHER): Admitting: Physician Assistant

## 2024-07-24 DIAGNOSIS — D500 Iron deficiency anemia secondary to blood loss (chronic): Secondary | ICD-10-CM

## 2024-08-24 ENCOUNTER — Ambulatory Visit: Payer: Self-pay | Admitting: Cardiology

## 2024-08-24 ENCOUNTER — Ambulatory Visit (INDEPENDENT_AMBULATORY_CARE_PROVIDER_SITE_OTHER): Payer: Federal, State, Local not specified - PPO

## 2024-08-24 DIAGNOSIS — I480 Paroxysmal atrial fibrillation: Secondary | ICD-10-CM | POA: Diagnosis not present

## 2024-08-24 LAB — CUP PACEART REMOTE DEVICE CHECK
Battery Remaining Longevity: 78 mo
Battery Remaining Percentage: 94 %
Battery Voltage: 2.99 V
Brady Statistic AP VP Percent: 4.1 %
Brady Statistic AP VS Percent: 3.2 %
Brady Statistic AS VP Percent: 20 %
Brady Statistic AS VS Percent: 72 %
Brady Statistic RA Percent Paced: 6.2 %
Brady Statistic RV Percent Paced: 24 %
Date Time Interrogation Session: 20250922085627
Implantable Lead Connection Status: 753985
Implantable Lead Connection Status: 753985
Implantable Lead Implant Date: 20240920
Implantable Lead Implant Date: 20240920
Implantable Lead Location: 753859
Implantable Lead Location: 753860
Implantable Pulse Generator Implant Date: 20240920
Lead Channel Impedance Value: 440 Ohm
Lead Channel Impedance Value: 460 Ohm
Lead Channel Pacing Threshold Amplitude: 0.625 V
Lead Channel Pacing Threshold Amplitude: 0.625 V
Lead Channel Pacing Threshold Pulse Width: 0.5 ms
Lead Channel Pacing Threshold Pulse Width: 0.5 ms
Lead Channel Sensing Intrinsic Amplitude: 12 mV
Lead Channel Sensing Intrinsic Amplitude: 5 mV
Lead Channel Setting Pacing Amplitude: 1.625
Lead Channel Setting Pacing Amplitude: 5 V
Lead Channel Setting Pacing Pulse Width: 0.5 ms
Lead Channel Setting Sensing Sensitivity: 2 mV
Pulse Gen Model: 2272
Pulse Gen Serial Number: 8213521

## 2024-08-24 NOTE — Telephone Encounter (Signed)
 This patient has hx of GI Bleed. Dr. Inocencio, would you like for them to still see AF clinic?

## 2024-08-25 ENCOUNTER — Telehealth: Payer: Self-pay | Admitting: Internal Medicine

## 2024-08-25 ENCOUNTER — Telehealth: Payer: Self-pay | Admitting: *Deleted

## 2024-08-25 NOTE — Telephone Encounter (Signed)
 Pt c/o medication issue:  1. Name of Medication: cilostazol  (PLETAL ) 50 MG tablet   2. How are you currently taking this medication (dosage and times per day)? Take 1 tablet (50 mg total) by mouth 2 (two) times daily.   3. Are you having a reaction (difficulty breathing--STAT)?   4. What is your medication issue? Patient states the device clinic called him and told him he has been in A-fib since 9/20, and he thinks this medication may be causing it.

## 2024-08-25 NOTE — Progress Notes (Signed)
 Remote PPM Transmission

## 2024-08-25 NOTE — Telephone Encounter (Signed)
 OK to hold Pletal  and discuss at his appt in [facility] clinic 9/25.  Pt states understanding and was appreciative of the call back and information.

## 2024-08-25 NOTE — Telephone Encounter (Signed)
 Alert received from [employer]:  Alert remote transmission:  Long AT/AF AF in progress from 9/20, not always good rate control, hx of PAF, Eliquis  has been restarted per EPIC notes, not on MAR Route to triage Program episode off, monitor burden Follow up as scheduled. LA, CVRS ______________________________________________________________________________  This RN did not see any evidence of patient restarting Eliquis  in recent notes or in [facility]  Patient has hx of recurrent GI bleeds and PAD claudication on antiplatelet (PLETAL )  Attempted to call patient to assess for symptoms and notify him that he is back in AF   Patient will need appt in AF clinic for management per [facility] recs  Patient did not answer call  Left message to call back  Routing to device triage pool to follow up with patient

## 2024-08-25 NOTE — Telephone Encounter (Signed)
 Patient called back to clinic  Patient notified of being back in AF since 08/22/24  Patient stated he has been feeling kinda punky last few days  Patient scheduled in AF clinic on 08/27/24 at 0830 with DOROTHA Heinrich, PA-C  All questions and concerns addressed at this time   Patient appreciative of RN assistance with getting appointment

## 2024-08-27 ENCOUNTER — Encounter (HOSPITAL_COMMUNITY): Payer: Self-pay | Admitting: Internal Medicine

## 2024-08-27 ENCOUNTER — Ambulatory Visit (HOSPITAL_COMMUNITY)
Admission: RE | Admit: 2024-08-27 | Discharge: 2024-08-27 | Disposition: A | Discharge status: Home / Self Care | Source: Ambulatory Visit | Attending: Internal Medicine | Admitting: Internal Medicine

## 2024-08-27 VITALS — BP 108/74 | HR 87 | Ht 68.0 in | Wt 216.8 lb

## 2024-08-27 DIAGNOSIS — I4891 Unspecified atrial fibrillation: Secondary | ICD-10-CM | POA: Diagnosis not present

## 2024-08-27 DIAGNOSIS — D6869 Other thrombophilia: Secondary | ICD-10-CM | POA: Diagnosis not present

## 2024-08-27 DIAGNOSIS — I4819 Other persistent atrial fibrillation: Secondary | ICD-10-CM | POA: Diagnosis not present

## 2024-08-27 MED ORDER — APIXABAN 5 MG PO TABS: 5.0000 mg | ORAL_TABLET | Freq: Two times a day (BID) | ORAL | 6 refills | Status: AC

## 2024-08-27 NOTE — Progress Notes (Signed)
 Primary Care Physician: Regino Slater, MD Primary Cardiologist: Arun K Thukkani, MD Electrophysiologist: Will Gladis Norton, MD     Referring Physician: Dr. Norton Drown Mascio is a 78 y.o. male with a history of CAD s/p CABG, COPD, CHB s/p PPM, severe life-threatening GI bleed, HTN, HLD, AVNRT, SVT, hypothyroidism, and paroxysmal atrial fibrillation who presents for consultation in the [facility]. Seen by Dr. Cindie on 08/08/23 for consideration of Watchman and felt to be at prohibitive risk to undergo watchman implant. He was recommended to continue aspirin . Patient has a CHADS2VASC score of at least 4.  On evaluation today, patient is currently in Afib. He feels tired and SOB with exertion when out of rhythm. He stopped the cilostazol  two days ago because he felt like it was not helping with his PAD symptoms and also suspicious it may have triggered current episode of Afib.  Today, he denies symptoms of palpitations, chest pain, orthopnea, PND, lower extremity edema, dizziness, presyncope, syncope, bleeding, or neurologic sequela. The patient is tolerating medications without difficulties and is otherwise without complaint today.    he has a BMI of Body mass index is 32.96 kg/m.SABRA Filed Weights   08/27/24 0841  Weight: 98.3 kg    Current Outpatient Medications  Medication Sig Dispense Refill   aspirin  EC 81 MG tablet Take 1 tablet (81 mg total) by mouth daily. Swallow whole. 90 tablet 3   atorvastatin  (LIPITOR ) 80 MG tablet Take 80 mg by mouth daily.     budesonide  (PULMICORT ) 0.5 MG/2ML nebulizer solution Take 2 mLs (0.5 mg total) by nebulization 2 (two) times daily. 120 mL 11   cetirizine  (ZYRTEC ) 10 MG tablet Take 1 tablet (10 mg total) by mouth daily. 90 tablet 3   cilostazol  (PLETAL ) 50 MG tablet Take 1 tablet (50 mg total) by mouth 2 (two) times daily. 180 tablet 3   colchicine  0.6 MG tablet Take 0.6 mg by mouth daily as needed (Gout).      dicyclomine  (BENTYL ) 10 MG capsule Take 10 mg by mouth 3 (three) times daily before meals. 30 minutes before eating     ezetimibe  (ZETIA ) 10 MG tablet Take 1 tablet (10 mg total) by mouth daily. 90 tablet 3   FeFum-FePoly-FA-B Cmp-C-Biot (INTEGRA PLUS ) CAPS Take 1 capsule by mouth every morning. 30 capsule 2   ferrous sulfate  325 (65 FE) MG EC tablet Take 325 mg by mouth daily with breakfast.     fluticasone  (FLONASE ) 50 MCG/ACT nasal spray Place 1 spray into both nostrils daily. 16 g 2   formoterol  (PERFOROMIST ) 20 MCG/2ML nebulizer solution Take 2 mLs (20 mcg total) by nebulization 2 (two) times daily. 120 mL 6   furosemide  (LASIX ) 40 MG tablet Take 2 tablets (80 mg total) by mouth daily. 30 tablet 0   glimepiride (AMARYL) 2 MG tablet Take 4 mg by mouth every morning.     levothyroxine  (SYNTHROID , LEVOTHROID) 88 MCG tablet Take 88 mcg by mouth daily before breakfast. Take on an empty stomach     metFORMIN (GLUCOPHAGE-XR) 500 MG 24 hr tablet Take 2,000 mg by mouth daily.     omeprazole (PRILOSEC) 40 MG capsule Take 40 mg by mouth daily.     ondansetron  (ZOFRAN ) 4 MG tablet Take 4 mg by mouth daily.     Potassium Chloride  ER 20 MEQ TBCR Take 1 tablet by mouth daily.     revefenacin  (YUPELRI ) 175 MCG/3ML nebulizer solution Take 3 mLs (175  mcg total) by nebulization daily. 90 mL 5   spironolactone  (ALDACTONE ) 25 MG tablet Take 1 tablet (25 mg total) by mouth daily. 30 tablet 0   No current facility-administered medications for this encounter.    Atrial Fibrillation Management history:  Previous antiarrhythmic drugs: none Previous cardioversions: none Previous ablations: none Anticoagulation history: none   ROS- All systems are reviewed and negative except as per the HPI above.  Physical Exam: BP 108/74   Pulse 87   Ht 5' 8 (1.727 m)   Wt 98.3 kg   BMI 32.96 kg/m   GEN: Well nourished, well developed in no acute distress NECK: No JVD; No carotid bruits CARDIAC: Irregularly  irregular rate and rhythm, no murmurs, rubs, gallops RESPIRATORY:  Clear to auscultation without rales, wheezing or rhonchi  ABDOMEN: Soft, non-tender, non-distended EXTREMITIES:  No edema; No deformity   EKG today demonstrates  Vent. rate 87 BPM PR interval * ms QRS duration 94 ms QT/QTcB 376/452 ms P-R-T axes * 26 269 Atrial fibrillation with occasional ventricular-paced complexes and with premature ventricular or aberrantly conducted complexes ST & T wave abnormality, consider inferolateral ischemia Abnormal ECG When compared with ECG of 10-Feb-2024 14:26, Electronic ventricular pacemaker has replaced Sinus rhythm  Echo 01/31/24 demonstrated  1. Left ventricular ejection fraction, by estimation, is 55 to 60%. The  left ventricle has normal function. The left ventricle has no regional  wall motion abnormalities. There is moderate concentric left ventricular  hypertrophy. Left ventricular  diastolic parameters are indeterminate.   2. Right ventricular systolic function is normal. The right ventricular  size is normal. There is mildly elevated pulmonary artery systolic  pressure.   3. Left atrial size was severely dilated.   4. Mitral Valve Area (MVA) = 1.12 cm2 by continuity equation. The mitral  valve is degenerative. No evidence of mitral valve regurgitation. Moderate  to severe mitral stenosis. Severe mitral annular calcification.   5. The aortic valve is calcified. Aortic valve regurgitation is not  visualized. Aortic valve sclerosis/calcification is present, without any  evidence of aortic stenosis.   6. Aortic dilatation noted.   7. The inferior vena cava is normal in size with greater than 50%  respiratory variability, suggesting right atrial pressure of 3 mmHg.   ASSESSMENT & PLAN CHA2DS2-VASc Score = 4  The patient's score is based upon: CHF History: 0 HTN History: 1 Diabetes History: 0 Stroke History: 0 Vascular Disease History: 1 Age Score: 2 Gender Score:  0       ASSESSMENT AND PLAN: Persistent Atrial Fibrillation (ICD10:  I48.19) The patient's CHA2DS2-VASc score is 4, indicating a 4.8% annual risk of stroke.    Patient is currently in Afib. Discussion with patient about anticoagulation, cardioversion, and risk of stroke vs risk of recurrent bleed. I discussed patient's case with primary cardiologist Dr. Mona. Patient is interested in anticoagulation and cardioversion to feel better if cardiologist agrees with anticoagulation. After discussion with Dr. Mona, feel it is reasonable to rechallenge patient with anticoagulation since he has not had recurrent bleeding issues s/p [facility] procedure 01/21/24 (- A single non-bleeding angioectasia in the ileum. Treated with argon plasma coagulation). If tolerates Eliquis  without issue, can reconsider potential for Watchman candidacy. Start Eliquis  5 mg BID, stop ASA. Okay to hold cilostazol .   Secondary Hypercoagulable State (ICD10:  D68.69) The patient is at significant risk for stroke/thromboembolism based upon his CHA2DS2-VASc Score of 4.  Start Apixaban  (Eliquis ).  We discussed the reasoning behind anticoagulation in the setting of stroke  prevention related to Afib. We discussed the benefits vs risks of anticoagulation. After discussion, patient would like to begin anticoagulation and understands the potential risks. Will begin Eliquis  5 mg BID. Stop ASA. Patient will follow up with me in 3 weeks to reassess, schedule cardioversion, and draw labs.     Follow up 3 weeks Afib clinic.   Terra Pac, [facility]  Afib Clinic [address] [place], [place] [address] [phone]

## 2024-08-28 ENCOUNTER — Telehealth: Payer: Self-pay | Admitting: Internal Medicine

## 2024-08-28 NOTE — Telephone Encounter (Signed)
 Pt c/o medication issue:  1. Name of Medication:   cilostazol  (PLETAL ) 50 MG tablet    2. How are you currently taking this medication (dosage and times per day)? As written  3. Are you having a reaction (difficulty breathing--STAT)? No   4. What is your medication issue? PER pt    Terra Fairy PARAS, PA-C said pt needs to stop this medication. Pt is calling to verify

## 2024-08-28 NOTE — Telephone Encounter (Signed)
 Left detailed message (ok per DPR) regarding cilostazol . Pt was told by a-fib clinic that he will need to stop taking this medication. Explained that we will remove medication from pt's medication list. Left call back number for additional questions or concerns.

## 2024-09-07 NOTE — Addendum Note (Signed)
 Addended by: LORING ANDRIETTE HERO on: 09/07/2024 08:45 AM   Modules accepted: Orders

## 2024-09-10 ENCOUNTER — Other Ambulatory Visit: Payer: Self-pay | Admitting: Adult Health

## 2024-09-16 ENCOUNTER — Ambulatory Visit (HOSPITAL_COMMUNITY)
Admission: RE | Admit: 2024-09-16 | Discharge: 2024-09-16 | Disposition: A | Discharge status: Home / Self Care | Source: Ambulatory Visit | Attending: Internal Medicine | Admitting: Internal Medicine

## 2024-09-16 ENCOUNTER — Ambulatory Visit (HOSPITAL_BASED_OUTPATIENT_CLINIC_OR_DEPARTMENT_OTHER)
Admission: RE | Admit: 2024-09-16 | Discharge: 2024-09-16 | Disposition: A | Discharge status: Home / Self Care | Source: Ambulatory Visit | Attending: Internal Medicine | Admitting: Internal Medicine

## 2024-09-16 DIAGNOSIS — I739 Peripheral vascular disease, unspecified: Secondary | ICD-10-CM | POA: Diagnosis present

## 2024-09-16 LAB — VAS US ABI WITH/WO TBI
Left ABI: 0.62
Right ABI: 0.75

## 2024-09-17 ENCOUNTER — Ambulatory Visit (HOSPITAL_COMMUNITY): Admitting: Internal Medicine

## 2024-09-17 NOTE — Telephone Encounter (Signed)
 Copied from CRM [id number]. Topic: Clinical - Prescription Issue >> Sep 14, 2024  2:02 PM Corean SAUNDERS wrote: Reason for CRM: Patient states his pharmacy has sent over multiple requests for a refill of Benzonatate  with no response.  Patient is requesting the refill please be sent to: [id number] [place], [place] - [address] AT [facility] OF [facility] [address] & [facility] [address] [place] [address] Phone: [phone] Fax: [phone]  This was sent on 10/13

## 2024-09-21 ENCOUNTER — Telehealth: Payer: Self-pay

## 2024-09-21 NOTE — Telephone Encounter (Signed)
 Alert remote transmission:  HVR 3 logged HVR's 10/17 longest duration 25sec, HR's 179-182, EGM's c/w AF with RVR, Eliquis  per EPIC  Patient is seeing Dr. Court on 09/23/24 this week to discuss DCCV.  Cc'ing to AF clinic who also is following patient.

## 2024-09-21 NOTE — Telephone Encounter (Signed)
 Patient was scheduled to see you last week on 10/16 and canceled the appt stating he didn't feel he needed the appointment as FYI.

## 2024-09-22 ENCOUNTER — Ambulatory Visit: Payer: Self-pay | Admitting: Internal Medicine

## 2024-09-22 DIAGNOSIS — I739 Peripheral vascular disease, unspecified: Secondary | ICD-10-CM

## 2024-09-23 ENCOUNTER — Encounter: Payer: Self-pay | Admitting: Cardiovascular Disease

## 2024-09-23 ENCOUNTER — Ambulatory Visit: Attending: Cardiology | Admitting: Cardiovascular Disease

## 2024-09-23 VITALS — BP 126/72 | HR 91 | Ht 68.0 in | Wt 219.9 lb

## 2024-09-23 DIAGNOSIS — I739 Peripheral vascular disease, unspecified: Secondary | ICD-10-CM

## 2024-09-23 MED ORDER — CILOSTAZOL 50 MG PO TABS: 50.0000 mg | ORAL_TABLET | Freq: Two times a day (BID) | ORAL | 1 refills | Status: DC

## 2024-09-23 NOTE — Patient Instructions (Signed)
 Medication Instructions:  Your physician has recommended you make the following change in your medication:   -Start cilostazol  (pletal ) 50mg  twice daily.  *If you need a refill on your cardiac medications before your next appointment, please call your pharmacy*    Follow-Up: At [facility], you and your health needs are our priority.  As part of our continuing mission to provide you with exceptional heart care, our providers are all part of one team.  This team includes your primary Cardiologist (physician) and Advanced Practice Providers or APPs (Physician Assistants and Nurse Practitioners) who all work together to provide you with the care you need, when you need it.  Your next appointment:   3 month(s)   Provider:   Dorn Lesches, MD    Other Instructions PV only

## 2024-09-23 NOTE — Progress Notes (Signed)
 Jeremiah [name] returns today with his wife Jeremiah [name] to discuss his claudication.  He does have known SFA disease by recent Doppler that has shown some progression.  His right ABI 0.75 with a high-frequency signal in his mid right SFA and his left ABI of is 0.62 with a newly occluded distal left SFA.  He had been on Pletal  briefly but this was stopped by the EP clinic.  He was experiencing GI bleeding and Eliquis  was discontinued.  He does have a history of atrial fibrillation requiring DOAC.  His GI bleed has been addressed and resolved at [facility].  Is currently on Eliquis .  He does have lifestyle-limiting claudication.  I am not sure he had an adequate trial of Pletal  which we will restart at 50 mg p.o. twice daily.  I will see him back in 3 months.  If he has not had significant clinical benefit we will discuss endovascular therapy for lifestyle-limiting claudication.  Dorn DOROTHA Lesches, M.D., FACP, [facility], FAHA, [facility]  [address] [place], [place]  [address]  [phone] 09/23/2024 11:59 AM

## 2024-09-23 NOTE — H&P (View-Only) (Signed)
 Mr. Schlarb returns today with his wife Lucie to discuss his claudication.  He does have known SFA disease by recent Doppler that has shown some progression.  His right ABI 0.75 with a high-frequency signal in his mid right SFA and his left ABI of is 0.62 with a newly occluded distal left SFA.  He had been on Pletal  briefly but this was stopped by the EP clinic.  He was experiencing GI bleeding and Eliquis  was discontinued.  He does have a history of atrial fibrillation requiring DOAC.  His GI bleed has been addressed and resolved at [facility].  Is currently on Eliquis .  He does have lifestyle-limiting claudication.  I am not sure he had an adequate trial of Pletal  which we will restart at 50 mg p.o. twice daily.  I will see him back in 3 months.  If he has not had significant clinical benefit we will discuss endovascular therapy for lifestyle-limiting claudication.  Dorn DOROTHA Lesches, M.D., FACP, [facility], FAHA, [facility]  [address] [place], [place]  [address]  [phone] 09/23/2024 11:59 AM

## 2024-09-28 ENCOUNTER — Telehealth: Payer: Self-pay

## 2024-09-28 NOTE — Telephone Encounter (Signed)
 Alert remote transmission:  Repeat  HVR  3 HVR's, longest duraiton  35sec in duration, HR's 180's,  EGM c/w AF with RVR, Eliquis  per EPIC There have been 16 HVR's since 09/20/24, Pt canceled 10/16 f/u appt. he felt he didn't need it Discontinue HVR alert x57mo, for non-actionable alerts - route to triage for awareness of programming changes  Patient saw Dr. Court on 10/22, does not appear there was a discussion around AF/RVR events or DCCV.  FYI to AF clinic of ongoing HVR events.

## 2024-10-01 NOTE — Progress Notes (Unsigned)
 Patient ID: Reynold Mantell, male   DOB: [birth year], 78 y.o.   MRN: [id number]  Reason for Consult: No chief complaint on file.   Referred by Regino Slater, MD  Subjective:     HPI Norrin Shreffler is a 78 y.o. male ***  Past Medical History:  Diagnosis Date   A-fib (HCC)    Acute on chronic systolic (congestive) heart failure (HCC) 06/10/2017   Acute pulmonary edema (HCC)    Anemia    Aortic valve sclerosis 12/2018   Noted on ECHO   Arthritis    CAD (coronary artery disease)    a. 2006: CABG in 2006 with LIMA-LAD, SVG-OM1, and SVG-RPDA   Cardiomegaly 12/2018   Stable, noted on CXR   Carpal tunnel syndrome    Right   Chronic pain 03/21/2016   Colon cancer (HCC) 2006   COPD (chronic obstructive pulmonary disease) (HCC)    Diabetes mellitus without complication (HCC)    DVT (deep venous thrombosis) (HCC)    Right   Essential hypertension 03/21/2016   GERD (gastroesophageal reflux disease)    History of blood transfusion    History of Clostridioides difficile infection    History of prostate cancer 03/21/2016   History of PSVT (paroxysmal supraventricular tachycardia)    HLD (hyperlipidemia)    HTN (hypertension)    Hx of CABG 2006   Hypercholesteremia 03/21/2016   Hypothyroidism    LAE (left atrial enlargement) 12/2018   Severe, Noted on ECHO   LVH (left ventricular hypertrophy) 12/2018   Mild, Noted on ECHO   Medication management 03/21/2016   Mitral annular calcification 12/2018   with mild MS, Noted on ECHO   Morbid obesity (HCC) 03/21/2016   Myocardial infarct (HCC)    OSA (obstructive sleep apnea) 03/21/2016   uses oxygen  at night time   Pain in right ankle and joints of right foot 03/21/2016   Paronychia 03/21/2016   Phimosis    Pneumonia    Primary insomnia 03/21/2016   Prostate cancer (HCC) 2008   Pulmonary hypertension (HCC) 12/2018   Moderate, Noted on ECHO   Tobacco dependence 03/21/2016   Tricuspid regurgitation 12/2018   Mild, Noted on ECHO    Family History  Problem Relation Age of Onset   Dementia Mother    Diabetes Sister    Past Surgical History:  Procedure Laterality Date   ANKLE SURGERY Right 12/2013   CARDIAC CATHETERIZATION N/A 05/08/2016   Procedure: Left Heart Cath and Cors/Grafts Angiography;  Surgeon: Victory LELON Sharps, MD;  Location: [facility] INVASIVE CV LAB;  Service: Cardiovascular;  Laterality: N/A;   CIRCUMCISION N/A 10/07/2019   Procedure: CIRCUMCISION ADULT;  Surgeon: Cam Morene LELON, MD;  Location: [facility] ORS;  Service: Urology;  Laterality: N/A;   CORONARY ARTERY BYPASS GRAFT  2006   x3   ELECTROPHYSIOLOGIC STUDY N/A 08/24/2016   Procedure: SVT Ablation;  Surgeon: Will Gladis Norton, MD;  Location: [facility] INVASIVE CV LAB;  Service: Cardiovascular;  Laterality: N/A;   PACEMAKER IMPLANT N/A 08/23/2023   Procedure: PACEMAKER IMPLANT;  Surgeon: Norton Soyla Gladis, MD;  Location: [facility] INVASIVE CV LAB;  Service: Cardiovascular;  Laterality: N/A;   PROSTATE SURGERY  2008   PTCA     UMBILICAL HERNIA REPAIR N/A 05/26/2022   Procedure: PRIMARY REPAIR OF STRANGULATED UMBILICAL HERNIA WITH PARTIAL OMENTECTOMY;  Surgeon: Aron Shoulders, MD;  Location: [facility] OR;  Service: General;  Laterality: N/A;    Short Social History:  Social History   Tobacco Use   Smoking status:  Former    Current packs/day: 0.00    Average packs/day: 0.8 packs/day for 60.0 years (45.0 ttl pk-yrs)    Types: Cigarettes    Start date: 12/15/1956    Quit date: 12/15/2016    Years since quitting: 7.8   Smokeless tobacco: Never   Tobacco comments:    Former smoker 08/27/24  Substance Use Topics   Alcohol use: Not Currently    Comment: very seldom    Allergies  Allergen Reactions   Shellfish Allergy Shortness Of Breath, Nausea And Vomiting and Rash    Scallops     Fluzone [Influenza Virus Vaccine] Nausea And Vomiting, Palpitations and Rash   Keflex  [Cephalexin ] Rash    Itching and rash on face.     Current Outpatient Medications  Medication Sig  Dispense Refill   apixaban  (ELIQUIS ) 5 MG TABS tablet Take 1 tablet (5 mg total) by mouth 2 (two) times daily. 60 tablet 6   atorvastatin  (LIPITOR ) 80 MG tablet Take 80 mg by mouth daily.     benzonatate  (TESSALON ) 200 MG capsule TAKE 1 CAPSULE(200 MG) BY MOUTH THREE TIMES DAILY AS NEEDED 45 capsule 3   budesonide  (PULMICORT ) 0.5 MG/2ML nebulizer solution Take 2 mLs (0.5 mg total) by nebulization 2 (two) times daily. 120 mL 11   cetirizine  (ZYRTEC ) 10 MG tablet Take 1 tablet (10 mg total) by mouth daily. 90 tablet 3   cilostazol  (PLETAL ) 50 MG tablet Take 1 tablet (50 mg total) by mouth 2 (two) times daily. 180 tablet 1   colchicine  0.6 MG tablet Take 0.6 mg by mouth daily as needed (Gout).     dicyclomine  (BENTYL ) 10 MG capsule Take 10 mg by mouth 3 (three) times daily before meals. 30 minutes before eating     ezetimibe  (ZETIA ) 10 MG tablet Take 1 tablet (10 mg total) by mouth daily. 90 tablet 3   FeFum-FePoly-FA-B Cmp-C-Biot (INTEGRA PLUS ) CAPS Take 1 capsule by mouth every morning. 30 capsule 2   ferrous sulfate  325 (65 FE) MG EC tablet Take 325 mg by mouth daily with breakfast.     fluticasone  (FLONASE ) 50 MCG/ACT nasal spray Place 1 spray into both nostrils daily. 16 g 2   formoterol  (PERFOROMIST ) 20 MCG/2ML nebulizer solution Take 2 mLs (20 mcg total) by nebulization 2 (two) times daily. 120 mL 6   furosemide  (LASIX ) 40 MG tablet Take 2 tablets (80 mg total) by mouth daily. 30 tablet 0   glimepiride (AMARYL) 2 MG tablet Take 4 mg by mouth every morning.     levothyroxine  (SYNTHROID , LEVOTHROID) 88 MCG tablet Take 88 mcg by mouth daily before breakfast. Take on an empty stomach     metFORMIN (GLUCOPHAGE-XR) 500 MG 24 hr tablet Take 2,000 mg by mouth daily.     omeprazole (PRILOSEC) 40 MG capsule Take 40 mg by mouth daily.     ondansetron  (ZOFRAN ) 4 MG tablet Take 4 mg by mouth daily.     Potassium Chloride  ER 20 MEQ TBCR Take 1 tablet by mouth daily.     revefenacin  (YUPELRI ) 175 MCG/3ML  nebulizer solution Take 3 mLs (175 mcg total) by nebulization daily. 90 mL 5   spironolactone  (ALDACTONE ) 25 MG tablet Take 1 tablet (25 mg total) by mouth daily. 30 tablet 0   No current facility-administered medications for this visit.    REVIEW OF SYSTEMS  All other systems were reviewed and are negative     Objective:  Objective   There were no vitals filed for this  visit. There is no height or weight on file to calculate BMI.  Physical Exam General: no acute distress Cardiac: hemodynamically stable Abdomen: non-tender, no pulsatile mass*** Extremities: no edema, cyanosis or wounds*** Vascular:   Right: ***  Left: ***  Data: ABI ABI Findings:  +---------+------------------+-----+----------+--------+  Right   Rt Pressure (mmHg)IndexWaveform  Comment   +---------+------------------+-----+----------+--------+  Brachial 150                                        +---------+------------------+-----+----------+--------+  PTA     120               0.75 monophasic          +---------+------------------+-----+----------+--------+  DP      109               0.68 monophasic          +---------+------------------+-----+----------+--------+  Great Toe68                0.42 Abnormal            +---------+------------------+-----+----------+--------+   +---------+------------------+-----+----------+-------+  Left    Lt Pressure (mmHg)IndexWaveform  Comment  +---------+------------------+-----+----------+-------+  Brachial 160                                       +---------+------------------+-----+----------+-------+  PTA     100               0.62 monophasic         +---------+------------------+-----+----------+-------+  DP      99                0.62 monophasic         +---------+------------------+-----+----------+-------+  Great Toe68                0.42 Abnormal            +---------+------------------+-----+----------+-------+   Duplex +-----------+--------+-----+---------------+----------+-------------------+   RIGHT     PSV cm/sRatioStenosis       Waveform  Comments              +-----------+--------+-----+---------------+----------+-------------------+   CFA Prox   151                         biphasic                        +-----------+--------+-----+---------------+----------+-------------------+   CFA Distal 112                         biphasic                        +-----------+--------+-----+---------------+----------+-------------------+   DFA       161                         biphasic                        +-----------+--------+-----+---------------+----------+-------------------+   SFA Prox   79                          biphasic                        +-----------+--------+-----+---------------+----------+-------------------+  SFA Mid    572     4.7  75-99% stenosisstenotic  calcific plaque       +-----------+--------+-----+---------------+----------+-------------------+   SFA Distal 52                          monophasic                      +-----------+--------+-----+---------------+----------+-------------------+   POP Prox   42                          monophasic                      +-----------+--------+-----+---------------+----------+-------------------+   POP Distal 34                          monophasic                      +-----------+--------+-----+---------------+----------+-------------------+   TP Trunk   43                          monophasic                      +-----------+--------+-----+---------------+----------+-------------------+   ATA Distal 33                          monophasic                      +-----------+--------+-----+---------------+----------+-------------------+   PTA Distal 34                           monophasic                      +-----------+--------+-----+---------------+----------+-------------------+   PERO Distal0                                     suspected  occlusion  +-----------+--------+-----+---------------+----------+-------------------+    A focal velocity elevation of 572 cm/s was obtained at mid SFA with post  stenotic turbulence with a VR of 4.7. Findings are characteristic of  75-99% stenosis.      +-----------+--------+-----+--------+----------+---------------------------  ----+  LEFT      PSV cm/sRatioStenosisWaveform  Comments                          +-----------+--------+-----+--------+----------+---------------------------  ----+  CFA Prox   109                  triphasic                                   +-----------+--------+-----+--------+----------+---------------------------  ----+  DFA       155                  monophasic                                  +-----------+--------+-----+--------+----------+---------------------------  ----+  SFA Prox  59                   biphasic                                    +-----------+--------+-----+--------+----------+---------------------------  ----+  SFA Mid    59                   biphasic                                    +-----------+--------+-----+--------+----------+---------------------------  ----+  SFA Distal 0            occluded          heavily calcified, unable  to                                               insonate flow                     +-----------+--------+-----+--------+----------+---------------------------  ----+  POP Prox   30                   monophasic                                  +-----------+--------+-----+--------+----------+---------------------------  ----+  POP Distal 21                   monophasic                                   +-----------+--------+-----+--------+----------+---------------------------  ----+  TP Trunk   31                   monophasic                                  +-----------+--------+-----+--------+----------+---------------------------  ----+  ATA Distal 31                   monophasic                                  +-----------+--------+-----+--------+----------+---------------------------  ----+  PTA Distal 34                   monophasic                                  +-----------+--------+-----+--------+----------+---------------------------  ----+  PERO Distal17                   monophasic                                  +-----------+--------+-----+--------+----------+---------------------------  ----+   Suspected occlusion of the distal SFA with reconstituted flow at the  above-knee popliteal artery.  CMP reviewed Cr 0.98     Assessment/Plan:   Vadim Centola is a 78 y.o. male with PAD and ***  Recommendations to optimize cardiovascular risk: Abstinence from all tobacco products. Blood glucose control with goal A1c < 7%. Blood pressure control with goal blood pressure < 140/90 mmHg. Lipid reduction therapy with goal LDL-C <55 mg/dL  Aspirin  81mg  PO QD.  Atorvastatin  40-80mg  PO QD (or other high intensity statin therapy).   Norman GORMAN Serve MD [facility]

## 2024-10-02 ENCOUNTER — Telehealth: Payer: Self-pay | Admitting: Internal Medicine

## 2024-10-02 ENCOUNTER — Ambulatory Visit: Admitting: Vascular Surgery

## 2024-10-02 ENCOUNTER — Encounter (HOSPITAL_COMMUNITY): Payer: Self-pay | Admitting: Internal Medicine

## 2024-10-02 ENCOUNTER — Encounter: Payer: Self-pay | Admitting: Vascular Surgery

## 2024-10-02 ENCOUNTER — Ambulatory Visit
Admission: RE | Admit: 2024-10-02 | Discharge: 2024-10-02 | Disposition: A | Discharge status: Home / Self Care | Source: Ambulatory Visit | Attending: Internal Medicine | Admitting: Internal Medicine

## 2024-10-02 VITALS — BP 129/72 | HR 97 | Temp 98.6°F | Resp 20 | Ht 68.0 in | Wt 221.4 lb

## 2024-10-02 VITALS — BP 128/78 | HR 95 | Ht 68.0 in | Wt 220.4 lb

## 2024-10-02 DIAGNOSIS — I4819 Other persistent atrial fibrillation: Secondary | ICD-10-CM

## 2024-10-02 DIAGNOSIS — D6869 Other thrombophilia: Secondary | ICD-10-CM | POA: Diagnosis not present

## 2024-10-02 DIAGNOSIS — I739 Peripheral vascular disease, unspecified: Secondary | ICD-10-CM

## 2024-10-02 NOTE — Patient Instructions (Addendum)
 Call us  if you want move forward with cardioversion   Cardioversion scheduled for: call us  for time    - Arrive at the Main Entrance A of [facility] ([address]  and check in with ADMITTING at   - Do not eat or drink anything after midnight the night prior to your procedure.   - Take all your morning medication (except diabetic medications) with a sip of water  prior to arrival.  - Do NOT miss any doses of your blood thinner - if you should miss a dose or take a dose more than 4 hours late -- please notify our office immediately.  - You will not be able to drive home after your procedure. Please ensure you have a responsible adult to drive you home. You will need someone with you for 24 hours post procedure.     - Expect to be in the procedural area approximately 2 hours.   - If you feel as if you go back into normal rhythm prior to scheduled cardioversion, please notify our office immediately.   If your procedure is canceled in the cardioversion suite you will be charged a cancellation fee.

## 2024-10-02 NOTE — Progress Notes (Signed)
 Primary Care Physician: Regino Slater, MD Primary Cardiologist: Arun K Thukkani, MD Electrophysiologist: Will Gladis Norton, MD     Referring Physician: Dr. Norton Drown Jeremiah [name] is a 78 y.o. male with a history of CAD s/p CABG, COPD, CHB s/p PPM, severe life-threatening GI bleed, HTN, HLD, AVNRT, SVT, hypothyroidism, and paroxysmal atrial fibrillation who presents for consultation in the [facility]. Seen by Dr. Cindie on 08/08/23 for consideration of Watchman and felt to be at prohibitive risk to undergo watchman implant. He was recommended to continue aspirin . Patient has a CHADS2VASC score of at least 4.  On evaluation today, patient is currently in Afib. He feels tired and SOB with exertion when out of rhythm. He stopped the cilostazol  two days ago because he felt like it was not helping with his PAD symptoms and also suspicious it may have triggered current episode of Afib.  On follow up 10/02/24, patient is currently in Afib. He began Eliquis  at last office visit. Placed back on Pletal  50 mg BID for trial per Dr. Court for claudication. Device clinic alert on 10/27 for 16 HVR's since 09/20/24. Patient had canceled appt on 10/16 stating he did not need it. His wife does note he has certain days where he does not feel well. No bleeding issues on Eliquis  and tolerating anticoagulant without any problem so far.  Today, he denies symptoms of palpitations, chest pain, orthopnea, PND, lower extremity edema, dizziness, presyncope, syncope, bleeding, or neurologic sequela. The patient is tolerating medications without difficulties and is otherwise without complaint today.    he has a BMI of Body mass index is 33.51 kg/m.SABRA Filed Weights   10/02/24 1009  Weight: 100 kg     Current Outpatient Medications  Medication Sig Dispense Refill   apixaban  (ELIQUIS ) 5 MG TABS tablet Take 1 tablet (5 mg total) by mouth 2 (two) times daily. 60 tablet 6   atorvastatin   (LIPITOR ) 80 MG tablet Take 80 mg by mouth daily.     benzonatate  (TESSALON ) 200 MG capsule TAKE 1 CAPSULE(200 MG) BY MOUTH THREE TIMES DAILY AS NEEDED 45 capsule 3   budesonide  (PULMICORT ) 0.5 MG/2ML nebulizer solution Take 2 mLs (0.5 mg total) by nebulization 2 (two) times daily. 120 mL 11   cetirizine  (ZYRTEC ) 10 MG tablet Take 1 tablet (10 mg total) by mouth daily. 90 tablet 3   cilostazol  (PLETAL ) 50 MG tablet Take 1 tablet (50 mg total) by mouth 2 (two) times daily. 180 tablet 1   colchicine  0.6 MG tablet Take 0.6 mg by mouth daily as needed (Gout).     dicyclomine  (BENTYL ) 10 MG capsule Take 10 mg by mouth 3 (three) times daily before meals. 30 minutes before eating     ezetimibe  (ZETIA ) 10 MG tablet Take 1 tablet (10 mg total) by mouth daily. 90 tablet 3   FeFum-FePoly-FA-B Cmp-C-Biot (INTEGRA PLUS ) CAPS Take 1 capsule by mouth every morning. 30 capsule 2   ferrous sulfate  325 (65 FE) MG EC tablet Take 325 mg by mouth daily with breakfast.     fluticasone  (FLONASE ) 50 MCG/ACT nasal spray Place 1 spray into both nostrils daily. 16 g 2   formoterol  (PERFOROMIST ) 20 MCG/2ML nebulizer solution Take 2 mLs (20 mcg total) by nebulization 2 (two) times daily. 120 mL 6   furosemide  (LASIX ) 40 MG tablet Take 2 tablets (80 mg total) by mouth daily. 30 tablet 0   glimepiride (AMARYL) 2 MG tablet Take 4 mg  by mouth every morning.     levothyroxine  (SYNTHROID , LEVOTHROID) 88 MCG tablet Take 88 mcg by mouth daily before breakfast. Take on an empty stomach     metFORMIN (GLUCOPHAGE-XR) 500 MG 24 hr tablet Take 2,000 mg by mouth daily.     omeprazole (PRILOSEC) 40 MG capsule Take 40 mg by mouth daily.     ondansetron  (ZOFRAN ) 4 MG tablet Take 4 mg by mouth daily.     Potassium Chloride  ER 20 MEQ TBCR Take 1 tablet by mouth daily.     revefenacin  (YUPELRI ) 175 MCG/3ML nebulizer solution Take 3 mLs (175 mcg total) by nebulization daily. 90 mL 5   spironolactone  (ALDACTONE ) 25 MG tablet Take 1 tablet (25 mg  total) by mouth daily. 30 tablet 0   No current facility-administered medications for this encounter.    Atrial Fibrillation Management history:  Previous antiarrhythmic drugs: none Previous cardioversions: none Previous ablations: none Anticoagulation history: Eliquis    ROS- All systems are reviewed and negative except as per the HPI above.  Physical Exam: BP 128/78   Pulse 95   Ht 5' 8 (1.727 m)   Wt 100 kg   BMI 33.51 kg/m   GEN- The patient is well appearing, alert and oriented x 3 today. He is on oxygen . Neck - no JVD or carotid bruit noted Lungs- Rhonchi and wheezes bilaterally Heart- Irregular rate and rhythm, no murmurs, rubs or gallops, PMI not laterally displaced Extremities- no clubbing, cyanosis, or edema Skin - no rash or ecchymosis noted   EKG today demonstrates  Vent. rate 95 BPM PR interval * ms QRS duration 92 ms QT/QTcB 330/414 ms P-R-T axes * 30 -76 Atrial fibrillation T wave abnormality, consider inferolateral ischemia Abnormal ECG When compared with ECG of 27-Aug-2024 08:44, Atrial fibrillation has replaced Electronic ventricular pacemaker  Echo 01/31/24 demonstrated  1. Left ventricular ejection fraction, by estimation, is 55 to 60%. The  left ventricle has normal function. The left ventricle has no regional  wall motion abnormalities. There is moderate concentric left ventricular  hypertrophy. Left ventricular  diastolic parameters are indeterminate.   2. Right ventricular systolic function is normal. The right ventricular  size is normal. There is mildly elevated pulmonary artery systolic  pressure.   3. Left atrial size was severely dilated.   4. Mitral Valve Area (MVA) = 1.12 cm2 by continuity equation. The mitral  valve is degenerative. No evidence of mitral valve regurgitation. Moderate  to severe mitral stenosis. Severe mitral annular calcification.   5. The aortic valve is calcified. Aortic valve regurgitation is not  visualized.  Aortic valve sclerosis/calcification is present, without any  evidence of aortic stenosis.   6. Aortic dilatation noted.   7. The inferior vena cava is normal in size with greater than 50%  respiratory variability, suggesting right atrial pressure of 3 mmHg.   ASSESSMENT & PLAN CHA2DS2-VASc Score = 4  The patient's score is based upon: CHF History: 0 HTN History: 1 Diabetes History: 0 Stroke History: 0 Vascular Disease History: 1 Age Score: 2 Gender Score: 0       ASSESSMENT AND PLAN: Persistent Atrial Fibrillation (ICD10:  I48.19) The patient's CHA2DS2-VASc score is 4, indicating a 4.8% annual risk of stroke.    Patient is currently in Afib. We discussed the procedure cardioversion to try to convert to NSR. We discussed the risks vs benefits of this procedure and how ultimately we cannot predict whether a patient will have early return of arrhythmia post procedure. After discussion,  the patient wishes to proceed with cardioversion but would first like to seek approval from Dr. Mona and also Dr. Pearline (sees him later today). Once he confirms with his care team, he would proceed with cardioversion. Will draw Bmet and CBC today.    Secondary Hypercoagulable State (ICD10:  D68.69) The patient is at significant risk for stroke/thromboembolism based upon his CHA2DS2-VASc Score of 4. Continue Apixaban  (Eliquis ).  No missed doses of Eliquis .  After previous discussion with Dr. Mona, feel it is reasonable to rechallenge patient with anticoagulation since he has not had recurrent bleeding issues s/p [facility] procedure 01/21/24 (- A single non-bleeding angioectasia in the ileum. Treated with argon plasma coagulation). If tolerates Eliquis  without issue, can reconsider potential for Watchman candidacy.    Follow up will be 2 weeks after DCCV once scheduled.    Terra Pac, [facility]  Afib Clinic [address] [place], [place] [address] [phone]

## 2024-10-02 NOTE — Telephone Encounter (Signed)
 Patient walked in asking for Dr Mona to clear him for an ablation. Patient will need letter to give to Dr Mona to fill out.

## 2024-10-02 NOTE — Telephone Encounter (Signed)
 S/w the patient and he reports that he was seen in the Afib clinic and they want to schedule him for a Cardioversion. They were going to schedule him/get it set up today,but the patient said that he wanted to consult with Dr Mona first. He would like to get Dr [facility] advice before going through with the Cardioversion. (He went on to talk about how great Dr Mona is and that he has taken great care of him. He really values his advice).  Informed him that I will send this information to Dr Mona and we will get back to him. May be Monday or later. He verbalized understanding.

## 2024-10-03 LAB — CBC
Hematocrit: 38.9 % (ref 37.5–51.0)
Hemoglobin: 13.1 g/dL (ref 13.0–17.7)
MCH: 32.3 pg (ref 26.6–33.0)
MCHC: 33.7 g/dL (ref 31.5–35.7)
MCV: 96 fL (ref 79–97)
Platelets: 197 x10E3/uL (ref 150–450)
RBC: 4.05 x10E6/uL — ABNORMAL LOW (ref 4.14–5.80)
RDW: 13.4 % (ref 11.6–15.4)
WBC: 5.1 x10E3/uL (ref 3.4–10.8)

## 2024-10-03 LAB — BASIC METABOLIC PANEL WITH GFR
BUN/Creatinine Ratio: 17 (ref 10–24)
BUN: 21 mg/dL (ref 8–27)
CO2: 25 mmol/L (ref 20–29)
Calcium: 9.7 mg/dL (ref 8.6–10.2)
Chloride: 99 mmol/L (ref 96–106)
Creatinine, Ser: 1.21 mg/dL (ref 0.76–1.27)
Glucose: 197 mg/dL — ABNORMAL HIGH (ref 70–99)
Potassium: 4.2 mmol/L (ref 3.5–5.2)
Sodium: 141 mmol/L (ref 134–144)
eGFR: 62 mL/min/1.73 (ref >59)

## 2024-10-05 ENCOUNTER — Ambulatory Visit (HOSPITAL_COMMUNITY): Payer: Self-pay | Admitting: Internal Medicine

## 2024-10-05 NOTE — Telephone Encounter (Signed)
 Pt returning call. He states he is feeling good and has not had an episode since a few weeks ago. Please advise.

## 2024-10-06 NOTE — Telephone Encounter (Signed)
 MD sent patient a message in MyChart 10/05/24

## 2024-10-08 ENCOUNTER — Other Ambulatory Visit: Payer: Self-pay | Admitting: *Deleted

## 2024-10-08 ENCOUNTER — Telehealth (HOSPITAL_COMMUNITY): Payer: Self-pay | Admitting: *Deleted

## 2024-10-08 DIAGNOSIS — I739 Peripheral vascular disease, unspecified: Secondary | ICD-10-CM

## 2024-10-08 NOTE — Telephone Encounter (Signed)
 Sent mychart message advising Pt to send transmission.

## 2024-10-08 NOTE — Telephone Encounter (Signed)
 Patient would like to send transmission to determine if he is still persistently in afib and if cardioversion is needed. Will forward to device clinic for report.

## 2024-10-09 ENCOUNTER — Telehealth (HOSPITAL_COMMUNITY): Payer: Self-pay | Admitting: *Deleted

## 2024-10-09 ENCOUNTER — Telehealth: Payer: Self-pay

## 2024-10-09 ENCOUNTER — Other Ambulatory Visit (HOSPITAL_COMMUNITY): Payer: Self-pay | Admitting: *Deleted

## 2024-10-09 DIAGNOSIS — I4819 Other persistent atrial fibrillation: Secondary | ICD-10-CM

## 2024-10-09 NOTE — Telephone Encounter (Signed)
 Left detailed message requesting call back.  Pt needs to send transmission.

## 2024-10-09 NOTE — Telephone Encounter (Signed)
 Transmission received.   Presenting rhythm Afib/VS.  Will send to Afib clinic for review.

## 2024-10-09 NOTE — Telephone Encounter (Signed)
 Spoke with pt he agrees to move forward with DCCV that I scheduled for 10/13/24. I reviewed instructions with pt he verbalized understanding and agrees to plan. Follow up appt made post DCCV.

## 2024-10-09 NOTE — Telephone Encounter (Signed)
 Error

## 2024-10-09 NOTE — Telephone Encounter (Signed)
 Pt has sent in transmission and would like a call back once received

## 2024-10-12 NOTE — Progress Notes (Signed)
 Spoke to patient and instructed them to come at 1215  and to be NPO after 0000.     Confirmed that patient will have a ride home and someone to stay with them for 24 hours after the procedure.   Medications reviewed.  Confirmed blood thinner.  Confirmed no breaks in taking blood thinner for 3+ weeks prior to procedure.

## 2024-10-13 ENCOUNTER — Other Ambulatory Visit: Payer: Self-pay

## 2024-10-13 ENCOUNTER — Ambulatory Visit (HOSPITAL_COMMUNITY)

## 2024-10-13 ENCOUNTER — Inpatient Hospital Stay (HOSPITAL_COMMUNITY)
Admission: EM | Admit: 2024-10-13 | Discharge: 2024-10-15 | DRG: 280 | Disposition: A | Discharge status: Home / Self Care | Attending: Internal Medicine | Admitting: Internal Medicine

## 2024-10-13 ENCOUNTER — Encounter (HOSPITAL_COMMUNITY): Payer: Self-pay | Admitting: Student in an Organized Health Care Education/Training Program

## 2024-10-13 ENCOUNTER — Ambulatory Visit (HOSPITAL_COMMUNITY)
Admission: RE | Admit: 2024-10-13 | Discharge: 2024-10-13 | Disposition: A | Source: Home / Self Care | Attending: Student in an Organized Health Care Education/Training Program | Admitting: Student in an Organized Health Care Education/Training Program

## 2024-10-13 ENCOUNTER — Encounter (HOSPITAL_COMMUNITY)
Admission: RE | Disposition: A | Discharge status: Home / Self Care | Payer: Self-pay | Source: Home / Self Care | Attending: Student in an Organized Health Care Education/Training Program

## 2024-10-13 ENCOUNTER — Emergency Department (HOSPITAL_COMMUNITY)

## 2024-10-13 DIAGNOSIS — I252 Old myocardial infarction: Secondary | ICD-10-CM | POA: Insufficient documentation

## 2024-10-13 DIAGNOSIS — E039 Hypothyroidism, unspecified: Secondary | ICD-10-CM | POA: Diagnosis present

## 2024-10-13 DIAGNOSIS — J449 Chronic obstructive pulmonary disease, unspecified: Secondary | ICD-10-CM | POA: Diagnosis present

## 2024-10-13 DIAGNOSIS — Z8546 Personal history of malignant neoplasm of prostate: Secondary | ICD-10-CM

## 2024-10-13 DIAGNOSIS — Z7901 Long term (current) use of anticoagulants: Secondary | ICD-10-CM

## 2024-10-13 DIAGNOSIS — I4819 Other persistent atrial fibrillation: Secondary | ICD-10-CM

## 2024-10-13 DIAGNOSIS — Z79890 Hormone replacement therapy (postmenopausal): Secondary | ICD-10-CM

## 2024-10-13 DIAGNOSIS — Z87891 Personal history of nicotine dependence: Secondary | ICD-10-CM | POA: Insufficient documentation

## 2024-10-13 DIAGNOSIS — Z887 Allergy status to serum and vaccine status: Secondary | ICD-10-CM

## 2024-10-13 DIAGNOSIS — C189 Malignant neoplasm of colon, unspecified: Secondary | ICD-10-CM | POA: Diagnosis present

## 2024-10-13 DIAGNOSIS — I25810 Atherosclerosis of coronary artery bypass graft(s) without angina pectoris: Secondary | ICD-10-CM | POA: Diagnosis present

## 2024-10-13 DIAGNOSIS — Z820 Family history of epilepsy and other diseases of the nervous system: Secondary | ICD-10-CM

## 2024-10-13 DIAGNOSIS — I509 Heart failure, unspecified: Principal | ICD-10-CM

## 2024-10-13 DIAGNOSIS — Z9981 Dependence on supplemental oxygen: Secondary | ICD-10-CM

## 2024-10-13 DIAGNOSIS — K219 Gastro-esophageal reflux disease without esophagitis: Secondary | ICD-10-CM | POA: Insufficient documentation

## 2024-10-13 DIAGNOSIS — Z881 Allergy status to other antibiotic agents status: Secondary | ICD-10-CM

## 2024-10-13 DIAGNOSIS — E66811 Obesity, class 1: Secondary | ICD-10-CM | POA: Diagnosis present

## 2024-10-13 DIAGNOSIS — I272 Pulmonary hypertension, unspecified: Secondary | ICD-10-CM | POA: Insufficient documentation

## 2024-10-13 DIAGNOSIS — J9601 Acute respiratory failure with hypoxia: Secondary | ICD-10-CM | POA: Diagnosis not present

## 2024-10-13 DIAGNOSIS — Z951 Presence of aortocoronary bypass graft: Secondary | ICD-10-CM | POA: Insufficient documentation

## 2024-10-13 DIAGNOSIS — G4733 Obstructive sleep apnea (adult) (pediatric): Secondary | ICD-10-CM | POA: Diagnosis present

## 2024-10-13 DIAGNOSIS — I5033 Acute on chronic diastolic (congestive) heart failure: Secondary | ICD-10-CM | POA: Diagnosis present

## 2024-10-13 DIAGNOSIS — I4891 Unspecified atrial fibrillation: Secondary | ICD-10-CM | POA: Insufficient documentation

## 2024-10-13 DIAGNOSIS — Z91013 Allergy to seafood: Secondary | ICD-10-CM

## 2024-10-13 DIAGNOSIS — E1151 Type 2 diabetes mellitus with diabetic peripheral angiopathy without gangrene: Secondary | ICD-10-CM | POA: Insufficient documentation

## 2024-10-13 DIAGNOSIS — Z7951 Long term (current) use of inhaled steroids: Secondary | ICD-10-CM

## 2024-10-13 DIAGNOSIS — I110 Hypertensive heart disease with heart failure: Secondary | ICD-10-CM | POA: Diagnosis not present

## 2024-10-13 DIAGNOSIS — I080 Rheumatic disorders of both mitral and aortic valves: Secondary | ICD-10-CM | POA: Diagnosis present

## 2024-10-13 DIAGNOSIS — I2510 Atherosclerotic heart disease of native coronary artery without angina pectoris: Secondary | ICD-10-CM | POA: Insufficient documentation

## 2024-10-13 DIAGNOSIS — Z8719 Personal history of other diseases of the digestive system: Secondary | ICD-10-CM

## 2024-10-13 DIAGNOSIS — I442 Atrioventricular block, complete: Secondary | ICD-10-CM | POA: Diagnosis present

## 2024-10-13 DIAGNOSIS — R7989 Other specified abnormal findings of blood chemistry: Secondary | ICD-10-CM | POA: Diagnosis present

## 2024-10-13 DIAGNOSIS — Z833 Family history of diabetes mellitus: Secondary | ICD-10-CM

## 2024-10-13 DIAGNOSIS — E1165 Type 2 diabetes mellitus with hyperglycemia: Secondary | ICD-10-CM | POA: Diagnosis present

## 2024-10-13 DIAGNOSIS — J9621 Acute and chronic respiratory failure with hypoxia: Secondary | ICD-10-CM | POA: Diagnosis present

## 2024-10-13 DIAGNOSIS — I214 Non-ST elevation (NSTEMI) myocardial infarction: Secondary | ICD-10-CM | POA: Diagnosis present

## 2024-10-13 DIAGNOSIS — I739 Peripheral vascular disease, unspecified: Secondary | ICD-10-CM

## 2024-10-13 DIAGNOSIS — Z6833 Body mass index (BMI) 33.0-33.9, adult: Secondary | ICD-10-CM

## 2024-10-13 DIAGNOSIS — Z86718 Personal history of other venous thrombosis and embolism: Secondary | ICD-10-CM

## 2024-10-13 DIAGNOSIS — I059 Rheumatic mitral valve disease, unspecified: Secondary | ICD-10-CM

## 2024-10-13 DIAGNOSIS — Z7984 Long term (current) use of oral hypoglycemic drugs: Secondary | ICD-10-CM

## 2024-10-13 DIAGNOSIS — E7800 Pure hypercholesterolemia, unspecified: Secondary | ICD-10-CM | POA: Diagnosis present

## 2024-10-13 DIAGNOSIS — Z950 Presence of cardiac pacemaker: Secondary | ICD-10-CM

## 2024-10-13 DIAGNOSIS — Z79899 Other long term (current) drug therapy: Secondary | ICD-10-CM

## 2024-10-13 DIAGNOSIS — Z7902 Long term (current) use of antithrombotics/antiplatelets: Secondary | ICD-10-CM

## 2024-10-13 HISTORY — PX: CARDIOVERSION: EP1203

## 2024-10-13 LAB — CBC WITH DIFFERENTIAL/PLATELET
Abs Immature Granulocytes: 0.08 K/uL — ABNORMAL HIGH (ref 0.00–0.07)
Basophils Absolute: 0 K/uL (ref 0.0–0.1)
Basophils Relative: 0 %
Eosinophils Absolute: 0.1 K/uL (ref 0.0–0.5)
Eosinophils Relative: 1 %
HCT: 36.5 % — ABNORMAL LOW (ref 39.0–52.0)
Hemoglobin: 11.8 g/dL — ABNORMAL LOW (ref 13.0–17.0)
Immature Granulocytes: 1 %
Lymphocytes Relative: 14 %
Lymphs Abs: 1.4 K/uL (ref 0.7–4.0)
MCH: 32.1 pg (ref 26.0–34.0)
MCHC: 32.3 g/dL (ref 30.0–36.0)
MCV: 99.2 fL (ref 80.0–100.0)
Monocytes Absolute: 0.7 K/uL (ref 0.1–1.0)
Monocytes Relative: 8 %
Neutro Abs: 7.2 K/uL (ref 1.7–7.7)
Neutrophils Relative %: 76 %
Platelets: 186 K/uL (ref 150–400)
RBC: 3.68 MIL/uL — ABNORMAL LOW (ref 4.22–5.81)
RDW: 15.2 % (ref 11.5–15.5)
WBC: 9.6 K/uL (ref 4.0–10.5)
nRBC: 0 % (ref 0.0–0.2)

## 2024-10-13 LAB — BRAIN NATRIURETIC PEPTIDE: B Natriuretic Peptide: 119.1 pg/mL — ABNORMAL HIGH (ref 0.0–100.0)

## 2024-10-13 LAB — TSH: TSH: 3.833 u[IU]/mL (ref 0.350–4.500)

## 2024-10-13 SURGERY — CARDIOVERSION (CATH LAB)
Anesthesia: General

## 2024-10-13 MED ORDER — PROPOFOL 10 MG/ML IV BOLUS
INTRAVENOUS | Status: DC | PRN
  Administered 2024-10-13: 50 mg via INTRAVENOUS

## 2024-10-13 MED ORDER — LIDOCAINE 2% (20 MG/ML) 5 ML SYRINGE
INTRAMUSCULAR | Status: DC | PRN
  Administered 2024-10-13: 50 mg via INTRAVENOUS

## 2024-10-13 MED ORDER — SODIUM CHLORIDE 0.9 % IV SOLN: INTRAVENOUS | Status: DC

## 2024-10-13 MED ORDER — FUROSEMIDE 10 MG/ML IJ SOLN
80.0000 mg | Freq: Once | INTRAMUSCULAR | Status: AC
  Administered 2024-10-14: 80 mg via INTRAVENOUS
  Filled 2024-10-13: qty 8

## 2024-10-13 SURGICAL SUPPLY — 1 items: PAD DEFIB RADIO PHYSIO CONN (PAD) ×1 IMPLANT

## 2024-10-13 NOTE — ED Triage Notes (Signed)
 Pt bibgcems from home c/o sob oxygen  saturation mid 80s on room air. With NRB sats 98%. Hr 180s  afib rvr with ems, cardioversion procedure done today at 12 noon. EMS gave 20mg  Cardizem and pt converted to sinus tach.  187/94 Hr 126 97% NRB

## 2024-10-13 NOTE — Progress Notes (Signed)
Pt placed on Bipap per MD order. Pt tolerating well at this time.

## 2024-10-13 NOTE — Anesthesia Preprocedure Evaluation (Signed)
 Anesthesia Evaluation  Patient identified by MRN, date of birth, ID band Patient awake    Reviewed: Allergy & Precautions, H&P , NPO status , Patient's Chart, lab work & pertinent test results  History of Anesthesia Complications Negative for: history of anesthetic complications  Airway Mallampati: II  TM Distance: >3 FB Neck ROM: Full    Dental no notable dental hx.    Pulmonary sleep apnea , COPD, former smoker   Pulmonary exam normal breath sounds clear to auscultation       Cardiovascular hypertension, pulmonary hypertension+ CAD, + Past MI, + CABG, + Peripheral Vascular Disease and +CHF  Normal cardiovascular exam+ dysrhythmias Atrial Fibrillation  Rhythm:Regular Rate:Normal  IMPRESSIONS     1. Left ventricular ejection fraction, by estimation, is 55 to 60%. The  left ventricle has normal function. The left ventricle has no regional  wall motion abnormalities. There is moderate concentric left ventricular  hypertrophy. Left ventricular  diastolic parameters are indeterminate.   2. Right ventricular systolic function is normal. The right ventricular  size is normal. There is mildly elevated pulmonary artery systolic  pressure.   3. Left atrial size was severely dilated.   4. Mitral Valve Area (MVA) = 1.12 cm2 by continuity equation. The mitral  valve is degenerative. No evidence of mitral valve regurgitation. Moderate  to severe mitral stenosis. Severe mitral annular calcification.   5. The aortic valve is calcified. Aortic valve regurgitation is not  visualized. Aortic valve sclerosis/calcification is present, without any  evidence of aortic stenosis.   6. Aortic dilatation noted.   7. The inferior vena cava is normal in size with greater than 50%  respiratory variability, suggesting right atrial pressure of 3 mmHg.     Neuro/Psych neg Seizures negative neurological ROS  negative psych ROS   GI/Hepatic Neg liver  ROS,GERD  ,,  Endo/Other  diabetes, Type 2Hypothyroidism    Renal/GU negative Renal ROS  negative genitourinary   Musculoskeletal  (+) Arthritis ,    Abdominal   Peds negative pediatric ROS (+)  Hematology  (+) Blood dyscrasia, anemia   Anesthesia Other Findings   Reproductive/Obstetrics negative OB ROS                              Anesthesia Physical Anesthesia Plan  ASA: 4  Anesthesia Plan: General   Post-op Pain Management:    Induction: Intravenous  PONV Risk Score and Plan: 2 and Treatment may vary due to age or medical condition  Airway Management Planned: Natural Airway and Simple Face Mask  Additional Equipment: None  Intra-op Plan:   Post-operative Plan: Extubation in OR  Informed Consent: I have reviewed the patients History and Physical, chart, labs and discussed the procedure including the risks, benefits and alternatives for the proposed anesthesia with the patient or authorized representative who has indicated his/her understanding and acceptance.     Dental advisory given  Plan Discussed with: CRNA  Anesthesia Plan Comments:          Anesthesia Quick Evaluation

## 2024-10-13 NOTE — Transfer of Care (Signed)
 Immediate Anesthesia Transfer of Care Note  Patient: Jeremiah [name]  Procedure(s) Performed: CARDIOVERSION  Patient Location: Cath Lab  Anesthesia Type:General  Level of Consciousness: drowsy  Airway & Oxygen  Therapy: Patient Spontanous Breathing and Patient connected to nasal cannula oxygen   Post-op Assessment: Report given to RN and Post -op Vital signs reviewed and stable  Post vital signs: Reviewed and stable  Last Vitals:  Vitals Value Taken Time  BP    Temp    Pulse    Resp    SpO2      Last Pain:  Vitals:   10/13/24 1225  TempSrc:   PainSc: 0-No pain         Complications: No notable events documented.

## 2024-10-13 NOTE — Discharge Instructions (Signed)

## 2024-10-13 NOTE — CV Procedure (Signed)
   CARDIOVERSION    Procedure:   DCCV  Indication:  Symptomatic atrial fibrillation  Procedure Note:  The patient signed informed consent.  The patient has had therapeutic oral anticoagulation uninterrupted for greater than 3 weeks.  Anesthesia was administered per the anesthesia team.  Adequate airway was maintained throughout and vital followed per protocol.  The patient was cardioverted x 1 with 360J of biphasic synchronized energy.  The patient converted to NSR.  There were no apparent complications.  The patient had normal neuro status and respiratory status post procedure with vitals stable as recorded elsewhere.     Kanen Mottola T. Floretta HEATH, MD [place]  [facility]  1:43 PM

## 2024-10-13 NOTE — ED Provider Notes (Signed)
 [place] EMERGENCY DEPARTMENT AT [facility] Provider Note   CSN: [id number] Arrival date & time: 10/13/24  2237     Patient presents with: Shortness of Breath   Jeremiah [name] is a 78 y.o. male.  {Add pertinent medical, surgical, social history, OB history to YEP:67052} Patient presents to the emergency department for evaluation of shortness of breath.  Patient reports that he had a cardioversion for atrial fibrillation done earlier today.  He reports that he felt some chest tightness and mild shortness of breath after the procedure, symptoms have progressively worsened.  Patient comes in by ambulance and in distress, oxygen  saturations were in the 80s on room air initially.  He was placed on a nonrebreather with improvement of his oxygen  saturations.  EMS report rapid heart rate, received 20 mg of Cardizem during transport.       Prior to Admission medications   Medication Sig Start Date End Date Taking? Authorizing Provider  apixaban  (ELIQUIS ) 5 MG TABS tablet Take 1 tablet (5 mg total) by mouth 2 (two) times daily. 08/27/24   Terra Fairy PARAS, PA-C  atorvastatin  (LIPITOR ) 80 MG tablet Take 80 mg by mouth daily.    [provider]  benzonatate  (TESSALON ) 200 MG capsule TAKE 1 CAPSULE(200 MG) BY MOUTH THREE TIMES DAILY AS NEEDED 09/14/24   Parrett, Madelin RAMAN, NP  budesonide  (PULMICORT ) 0.5 MG/2ML nebulizer solution Take 2 mLs (0.5 mg total) by nebulization 2 (two) times daily. 03/10/24   Parrett, Madelin RAMAN, NP  cetirizine  (ZYRTEC ) 10 MG tablet Take 1 tablet (10 mg total) by mouth daily. 07/08/24   Hilty, Vinie BROCKS, MD  cilostazol  (PLETAL ) 50 MG tablet Take 1 tablet (50 mg total) by mouth 2 (two) times daily. 09/23/24   Court Dorn PARAS, MD  colchicine  0.6 MG tablet Take 0.6 mg by mouth daily as needed (Gout).    [provider]  dicyclomine  (BENTYL ) 10 MG capsule Take 10 mg by mouth 3 (three) times daily before meals. 30 minutes before eating 02/19/23   [provider]  ezetimibe  (ZETIA ) 10 MG tablet Take 1 tablet (10 mg total) by mouth daily. 12/03/23   Hilty, Vinie BROCKS, MD  FeFum-FePoly-FA-B Cmp-C-Biot (INTEGRA PLUS ) CAPS Take 1 capsule by mouth every morning. 05/12/24   Heilingoetter, Cassandra L, PA-C  fluticasone  (FLONASE ) 50 MCG/ACT nasal spray Place 1 spray into both nostrils daily. 07/08/24   Hilty, Vinie BROCKS, MD  formoterol  (PERFOROMIST ) 20 MCG/2ML nebulizer solution Take 2 mLs (20 mcg total) by nebulization 2 (two) times daily. 03/10/24   Parrett, Madelin RAMAN, NP  furosemide  (LASIX ) 40 MG tablet Take 2 tablets (80 mg total) by mouth daily. 06/11/23   Fairy Frames, MD  glimepiride (AMARYL) 2 MG tablet Take 4 mg by mouth every morning. 08/28/23   [provider]  levothyroxine  (SYNTHROID , LEVOTHROID) 88 MCG tablet Take 88 mcg by mouth daily before breakfast. Take on an empty stomach    [provider]  metFORMIN (GLUCOPHAGE-XR) 500 MG 24 hr tablet Take 2,000 mg by mouth daily. 11/28/23   [provider]  omeprazole (PRILOSEC) 40 MG capsule Take 40 mg by mouth daily.    [provider]  ondansetron  (ZOFRAN ) 4 MG tablet Take 4 mg by mouth every 8 (eight) hours as needed for vomiting or nausea. 12/20/23   [provider]  Potassium Chloride  ER 20 MEQ TBCR Take 20 mEq by mouth daily. 07/11/23   [provider]  revefenacin  (YUPELRI ) 175 MCG/3ML nebulizer solution Take  3 mLs (175 mcg total) by nebulization daily. 03/26/24   Parrett, Madelin RAMAN, NP  spironolactone  (ALDACTONE ) 25 MG tablet Take 1 tablet (25 mg total) by mouth daily. 06/12/23   Fairy Frames, MD    Allergies: Shellfish allergy, Fluzone [influenza virus vaccine], and Keflex  [cephalexin ]    Review of Systems  Updated Vital Signs BP (!) 165/72 (BP Location: Right Arm)   Pulse (!) 121   Temp 98.2 F (36.8 C) (Oral)   Resp (!) 26   SpO2 98%   Physical Exam Vitals and nursing note reviewed.  Constitutional:      General: He is not in  acute distress.    Appearance: He is well-developed.  HENT:     Head: Normocephalic and atraumatic.     Mouth/Throat:     Mouth: Mucous membranes are moist.  Eyes:     General: Vision grossly intact. Gaze aligned appropriately.     Extraocular Movements: Extraocular movements intact.     Conjunctiva/sclera: Conjunctivae normal.  Cardiovascular:     Rate and Rhythm: Normal rate and regular rhythm.     Pulses: Normal pulses.     Heart sounds: Normal heart sounds, S1 normal and S2 normal. No murmur heard.    No friction rub. No gallop.  Pulmonary:     Effort: Tachypnea, accessory muscle usage and respiratory distress present.     Breath sounds: Decreased air movement present. Decreased breath sounds present.  Abdominal:     Palpations: Abdomen is soft.     Tenderness: There is no abdominal tenderness. There is no guarding or rebound.     Hernia: No hernia is present.  Musculoskeletal:        General: No swelling.     Cervical back: Full passive range of motion without pain, normal range of motion and neck supple. No pain with movement, spinous process tenderness or muscular tenderness. Normal range of motion.     Right lower leg: 1+ Pitting Edema present.     Left lower leg: 1+ Pitting Edema present.  Skin:    General: Skin is warm and dry.     Capillary Refill: Capillary refill takes less than 2 seconds.     Findings: No ecchymosis, erythema, lesion or wound.  Neurological:     Mental Status: He is alert and oriented to person, place, and time.     GCS: GCS eye subscore is 4. GCS verbal subscore is 5. GCS motor subscore is 6.     Cranial Nerves: Cranial nerves 2-12 are intact.     Sensory: Sensation is intact.     Motor: Motor function is intact. No weakness or abnormal muscle tone.     Coordination: Coordination is intact.  Psychiatric:        Mood and Affect: Mood normal.        Speech: Speech normal.        Behavior: Behavior normal.     (all labs ordered are listed, but  only abnormal results are displayed) Labs Reviewed  BASIC METABOLIC PANEL WITH GFR  CBC WITH DIFFERENTIAL/PLATELET  BRAIN NATRIURETIC PEPTIDE  MAGNESIUM  TSH  T4, FREE  TROPONIN I (HIGH SENSITIVITY)    EKG: EKG Interpretation Date/Time:  Tuesday October 13 2024 22:48:13 EST Ventricular Rate:  119 PR Interval:  127 QRS Duration:  98 QT Interval:  361 QTC Calculation: 508 R Axis:   64  Text Interpretation: Sinus tachycardia Nonspecific repol abnormality, diffuse leads Prolonged QT interval Confirmed by Yolande Charleston [phone]) on  10/13/2024 10:58:32 PM  Radiology: EP STUDY Result Date: 10/13/2024 See surgical note for result.   {Document cardiac monitor, telemetry assessment procedure when appropriate:32947} Procedures   Medications Ordered in the ED - No data to display    {Click here for ABCD2, HEART and other calculators REFRESH Note before signing:1}                              Medical Decision Making  ***  {Document critical care time when appropriate  Document review of labs and clinical decision tools ie CHADS2VASC2, etc  Document your independent review of radiology images and any outside records  Document your discussion with family members, caretakers and with consultants  Document social determinants of health affecting pt's care  Document your decision making why or why not admission, treatments were needed:32947:::1}   Final diagnoses:  None    ED Discharge Orders     None

## 2024-10-13 NOTE — Interval H&P Note (Signed)
 History and Physical Interval Note:  10/13/2024 12:23 PM  Jeremiah [name]  has presented today for surgery, with the diagnosis of AFIB.  The various methods of treatment have been discussed with the patient and family. After consideration of risks, benefits and other options for treatment, the patient has consented to  Procedure(s): CARDIOVERSION (N/A) as a surgical intervention.  The patient's history has been reviewed, patient examined, no change in status, stable for surgery.  I have reviewed the patient's chart and labs.  Questions were answered to the patient's satisfaction.     Georganna Archer

## 2024-10-13 NOTE — Anesthesia Postprocedure Evaluation (Signed)
 Anesthesia Post Note  Patient: Jeremiah [name]  Procedure(s) Performed: CARDIOVERSION     Patient location during evaluation: PACU Anesthesia Type: General Level of consciousness: awake and alert Pain management: pain level controlled Vital Signs Assessment: post-procedure vital signs reviewed and stable Respiratory status: spontaneous breathing, nonlabored ventilation, respiratory function stable and patient connected to nasal cannula oxygen  Cardiovascular status: blood pressure returned to baseline and stable Postop Assessment: no apparent nausea or vomiting Anesthetic complications: no   No notable events documented.  Last Vitals:  Vitals:   10/13/24 1347 10/13/24 1357  BP: 102/84 120/74  Pulse: 82 91  Resp: 17 20  Temp:    SpO2: 96% 96%    Last Pain:  Vitals:   10/13/24 1357  TempSrc:   PainSc: 0-No pain                 Thom JONELLE Peoples

## 2024-10-14 ENCOUNTER — Inpatient Hospital Stay (HOSPITAL_COMMUNITY)

## 2024-10-14 ENCOUNTER — Encounter (HOSPITAL_COMMUNITY): Payer: Self-pay | Admitting: Family Medicine

## 2024-10-14 DIAGNOSIS — I5033 Acute on chronic diastolic (congestive) heart failure: Secondary | ICD-10-CM | POA: Diagnosis present

## 2024-10-14 DIAGNOSIS — G4733 Obstructive sleep apnea (adult) (pediatric): Secondary | ICD-10-CM

## 2024-10-14 DIAGNOSIS — Z951 Presence of aortocoronary bypass graft: Secondary | ICD-10-CM

## 2024-10-14 DIAGNOSIS — I2510 Atherosclerotic heart disease of native coronary artery without angina pectoris: Secondary | ICD-10-CM | POA: Diagnosis present

## 2024-10-14 DIAGNOSIS — Z7901 Long term (current) use of anticoagulants: Secondary | ICD-10-CM | POA: Diagnosis not present

## 2024-10-14 DIAGNOSIS — E039 Hypothyroidism, unspecified: Secondary | ICD-10-CM

## 2024-10-14 DIAGNOSIS — K219 Gastro-esophageal reflux disease without esophagitis: Secondary | ICD-10-CM | POA: Diagnosis present

## 2024-10-14 DIAGNOSIS — Z8719 Personal history of other diseases of the digestive system: Secondary | ICD-10-CM | POA: Diagnosis not present

## 2024-10-14 DIAGNOSIS — J9621 Acute and chronic respiratory failure with hypoxia: Secondary | ICD-10-CM | POA: Diagnosis present

## 2024-10-14 DIAGNOSIS — E782 Mixed hyperlipidemia: Secondary | ICD-10-CM

## 2024-10-14 DIAGNOSIS — J449 Chronic obstructive pulmonary disease, unspecified: Secondary | ICD-10-CM

## 2024-10-14 DIAGNOSIS — I214 Non-ST elevation (NSTEMI) myocardial infarction: Secondary | ICD-10-CM | POA: Diagnosis present

## 2024-10-14 DIAGNOSIS — I059 Rheumatic mitral valve disease, unspecified: Secondary | ICD-10-CM

## 2024-10-14 DIAGNOSIS — Z7902 Long term (current) use of antithrombotics/antiplatelets: Secondary | ICD-10-CM | POA: Diagnosis not present

## 2024-10-14 DIAGNOSIS — I5031 Acute diastolic (congestive) heart failure: Secondary | ICD-10-CM | POA: Diagnosis not present

## 2024-10-14 DIAGNOSIS — E1165 Type 2 diabetes mellitus with hyperglycemia: Secondary | ICD-10-CM

## 2024-10-14 DIAGNOSIS — R7989 Other specified abnormal findings of blood chemistry: Secondary | ICD-10-CM

## 2024-10-14 DIAGNOSIS — I110 Hypertensive heart disease with heart failure: Secondary | ICD-10-CM | POA: Diagnosis present

## 2024-10-14 DIAGNOSIS — E66811 Obesity, class 1: Secondary | ICD-10-CM | POA: Diagnosis present

## 2024-10-14 DIAGNOSIS — E7800 Pure hypercholesterolemia, unspecified: Secondary | ICD-10-CM | POA: Diagnosis present

## 2024-10-14 DIAGNOSIS — Z7984 Long term (current) use of oral hypoglycemic drugs: Secondary | ICD-10-CM | POA: Diagnosis not present

## 2024-10-14 DIAGNOSIS — J9601 Acute respiratory failure with hypoxia: Secondary | ICD-10-CM | POA: Diagnosis present

## 2024-10-14 DIAGNOSIS — I25810 Atherosclerosis of coronary artery bypass graft(s) without angina pectoris: Secondary | ICD-10-CM | POA: Diagnosis present

## 2024-10-14 DIAGNOSIS — I739 Peripheral vascular disease, unspecified: Secondary | ICD-10-CM

## 2024-10-14 DIAGNOSIS — E1151 Type 2 diabetes mellitus with diabetic peripheral angiopathy without gangrene: Secondary | ICD-10-CM | POA: Diagnosis present

## 2024-10-14 DIAGNOSIS — I4819 Other persistent atrial fibrillation: Secondary | ICD-10-CM

## 2024-10-14 DIAGNOSIS — I442 Atrioventricular block, complete: Secondary | ICD-10-CM | POA: Diagnosis present

## 2024-10-14 DIAGNOSIS — I080 Rheumatic disorders of both mitral and aortic valves: Secondary | ICD-10-CM | POA: Diagnosis present

## 2024-10-14 DIAGNOSIS — Z79890 Hormone replacement therapy (postmenopausal): Secondary | ICD-10-CM | POA: Diagnosis not present

## 2024-10-14 DIAGNOSIS — I272 Pulmonary hypertension, unspecified: Secondary | ICD-10-CM | POA: Diagnosis present

## 2024-10-14 DIAGNOSIS — Z6833 Body mass index (BMI) 33.0-33.9, adult: Secondary | ICD-10-CM | POA: Diagnosis not present

## 2024-10-14 DIAGNOSIS — C189 Malignant neoplasm of colon, unspecified: Secondary | ICD-10-CM | POA: Diagnosis present

## 2024-10-14 DIAGNOSIS — I10 Essential (primary) hypertension: Secondary | ICD-10-CM

## 2024-10-14 LAB — HEMOGLOBIN A1C
Hgb A1c MFr Bld: 6.5 % — ABNORMAL HIGH (ref 4.8–5.6)
Mean Plasma Glucose: 139.85 mg/dL

## 2024-10-14 LAB — CBC
HCT: 35.4 % — ABNORMAL LOW (ref 39.0–52.0)
Hemoglobin: 11.3 g/dL — ABNORMAL LOW (ref 13.0–17.0)
MCH: 31.8 pg (ref 26.0–34.0)
MCHC: 31.9 g/dL (ref 30.0–36.0)
MCV: 99.7 fL (ref 80.0–100.0)
Platelets: 188 K/uL (ref 150–400)
RBC: 3.55 MIL/uL — ABNORMAL LOW (ref 4.22–5.81)
RDW: 15.4 % (ref 11.5–15.5)
WBC: 5.6 K/uL (ref 4.0–10.5)
nRBC: 0 % (ref 0.0–0.2)

## 2024-10-14 LAB — ECHOCARDIOGRAM COMPLETE
AR max vel: 2.19 cm2
AV Area VTI: 2.01 cm2
AV Area mean vel: 2.14 cm2
AV Mean grad: 8 mmHg
AV Peak grad: 16 mmHg
Ao pk vel: 2 m/s
Area-P 1/2: 2.58 cm2
MV M vel: 5.24 m/s
MV Peak grad: 109.8 mmHg
MV VTI: 1.15 cm2
S' Lateral: 3 cm

## 2024-10-14 LAB — GLUCOSE, CAPILLARY
Glucose-Capillary: 141 mg/dL — ABNORMAL HIGH (ref 70–99)
Glucose-Capillary: 151 mg/dL — ABNORMAL HIGH (ref 70–99)
Glucose-Capillary: 175 mg/dL — ABNORMAL HIGH (ref 70–99)

## 2024-10-14 LAB — TROPONIN I (HIGH SENSITIVITY)
Troponin I (High Sensitivity): 40 ng/L — ABNORMAL HIGH (ref <18)
Troponin I (High Sensitivity): 110 ng/L (ref <18)
Troponin I (High Sensitivity): 138 ng/L (ref <18)
Troponin I (High Sensitivity): 141 ng/L (ref <18)

## 2024-10-14 LAB — BASIC METABOLIC PANEL WITH GFR
Anion gap: 15 (ref 5–15)
Anion gap: 15 (ref 5–15)
BUN: 16 mg/dL (ref 8–23)
BUN: 17 mg/dL (ref 8–23)
CO2: 25 mmol/L (ref 22–32)
CO2: 26 mmol/L (ref 22–32)
Calcium: 8.5 mg/dL — ABNORMAL LOW (ref 8.9–10.3)
Calcium: 8.5 mg/dL — ABNORMAL LOW (ref 8.9–10.3)
Chloride: 101 mmol/L (ref 98–111)
Chloride: 99 mmol/L (ref 98–111)
Creatinine, Ser: 0.98 mg/dL (ref 0.61–1.24)
Creatinine, Ser: 1.1 mg/dL (ref 0.61–1.24)
GFR, Estimated: >60 mL/min (ref >60)
GFR, Estimated: >60 mL/min (ref >60)
Glucose, Bld: 184 mg/dL — ABNORMAL HIGH (ref 70–99)
Glucose, Bld: 127 mg/dL — ABNORMAL HIGH (ref 70–99)
Potassium: 3.8 mmol/L (ref 3.5–5.1)
Potassium: 4.4 mmol/L (ref 3.5–5.1)
Sodium: 141 mmol/L (ref 135–145)
Sodium: 140 mmol/L (ref 135–145)

## 2024-10-14 LAB — T4, FREE: Free T4: 0.97 ng/dL (ref 0.61–1.12)

## 2024-10-14 LAB — MAGNESIUM
Magnesium: 1.8 mg/dL (ref 1.7–2.4)
Magnesium: 3.1 mg/dL — ABNORMAL HIGH (ref 1.7–2.4)

## 2024-10-14 LAB — CBG MONITORING, ED
Glucose-Capillary: 120 mg/dL — ABNORMAL HIGH (ref 70–99)
Glucose-Capillary: 118 mg/dL — ABNORMAL HIGH (ref 70–99)
Glucose-Capillary: 126 mg/dL — ABNORMAL HIGH (ref 70–99)

## 2024-10-14 LAB — MRSA NEXT GEN BY PCR, NASAL: MRSA by PCR Next Gen: NOT DETECTED

## 2024-10-14 LAB — APTT: aPTT: 40 s — ABNORMAL HIGH (ref 24–36)

## 2024-10-14 MED ORDER — NITROGLYCERIN 0.4 MG SL SUBL: 0.4000 mg | SUBLINGUAL_TABLET | SUBLINGUAL | Status: DC | PRN

## 2024-10-14 MED ORDER — PERFLUTREN LIPID MICROSPHERE
1.0000 mL | INTRAVENOUS | Status: AC | PRN
  Administered 2024-10-14: 3 mL via INTRAVENOUS

## 2024-10-14 MED ORDER — SENNOSIDES-DOCUSATE SODIUM 8.6-50 MG PO TABS: 1.0000 | ORAL_TABLET | Freq: Every evening | ORAL | Status: DC | PRN

## 2024-10-14 MED ORDER — SODIUM CHLORIDE 0.9% FLUSH
3.0000 mL | Freq: Two times a day (BID) | INTRAVENOUS | Status: DC
  Administered 2024-10-14 – 2024-10-15 (×4): 3 mL via INTRAVENOUS

## 2024-10-14 MED ORDER — IPRATROPIUM-ALBUTEROL 0.5-2.5 (3) MG/3ML IN SOLN
3.0000 mL | Freq: Four times a day (QID) | RESPIRATORY_TRACT | Status: DC | PRN
Start: 2024-10-14 — End: 2024-10-15
  Administered 2024-10-14 – 2024-10-15 (×3): 3 mL via RESPIRATORY_TRACT
  Filled 2024-10-14 (×3): qty 3

## 2024-10-14 MED ORDER — ATORVASTATIN CALCIUM 80 MG PO TABS
80.0000 mg | ORAL_TABLET | Freq: Every day | ORAL | Status: DC
  Administered 2024-10-14 – 2024-10-15 (×2): 80 mg via ORAL
  Filled 2024-10-14: qty 1
  Filled 2024-10-14: qty 2

## 2024-10-14 MED ORDER — ACETAMINOPHEN 650 MG RE SUPP: 650.0000 mg | Freq: Four times a day (QID) | RECTAL | Status: DC | PRN

## 2024-10-14 MED ORDER — INSULIN ASPART 100 UNIT/ML IJ SOLN: 0.0000 [IU] | Freq: Three times a day (TID) | INTRAMUSCULAR | Status: DC

## 2024-10-14 MED ORDER — ACETAMINOPHEN 325 MG PO TABS: 650.0000 mg | ORAL_TABLET | Freq: Four times a day (QID) | ORAL | Status: DC | PRN

## 2024-10-14 MED ORDER — SPIRONOLACTONE 25 MG PO TABS
25.0000 mg | ORAL_TABLET | Freq: Every day | ORAL | Status: DC
  Administered 2024-10-14 – 2024-10-15 (×2): 25 mg via ORAL
  Filled 2024-10-14 (×2): qty 1

## 2024-10-14 MED ORDER — FENTANYL CITRATE (PF) 50 MCG/ML IJ SOSY: 12.5000 ug | PREFILLED_SYRINGE | INTRAMUSCULAR | Status: DC | PRN

## 2024-10-14 MED ORDER — FUROSEMIDE 10 MG/ML IJ SOLN
40.0000 mg | Freq: Two times a day (BID) | INTRAMUSCULAR | Status: DC
  Administered 2024-10-14 (×2): 40 mg via INTRAVENOUS
  Filled 2024-10-14 (×2): qty 4

## 2024-10-14 MED ORDER — INSULIN ASPART 100 UNIT/ML IJ SOLN: 0.0000 [IU] | Freq: Every day | INTRAMUSCULAR | Status: DC

## 2024-10-14 MED ORDER — HEPARIN (PORCINE) 25000 UT/250ML-% IV SOLN: 1300.0000 [IU]/h | INTRAVENOUS | Status: DC

## 2024-10-14 MED ORDER — LEVOTHYROXINE SODIUM 88 MCG PO TABS
88.0000 ug | ORAL_TABLET | Freq: Every day | ORAL | Status: DC
  Administered 2024-10-14 – 2024-10-15 (×2): 88 ug via ORAL
  Filled 2024-10-14 (×2): qty 1

## 2024-10-14 MED ORDER — OXYCODONE HCL 5 MG PO TABS: 5.0000 mg | ORAL_TABLET | ORAL | Status: DC | PRN

## 2024-10-14 MED ORDER — POTASSIUM CHLORIDE CRYS ER 20 MEQ PO TBCR
20.0000 meq | EXTENDED_RELEASE_TABLET | Freq: Every day | ORAL | Status: DC
  Administered 2024-10-14 – 2024-10-15 (×2): 20 meq via ORAL
  Filled 2024-10-14 (×2): qty 1

## 2024-10-14 MED ORDER — APIXABAN 5 MG PO TABS
5.0000 mg | ORAL_TABLET | Freq: Two times a day (BID) | ORAL | Status: DC
  Administered 2024-10-14: 5 mg via ORAL
  Filled 2024-10-14: qty 1

## 2024-10-14 MED ORDER — PANTOPRAZOLE SODIUM 40 MG PO TBEC
40.0000 mg | DELAYED_RELEASE_TABLET | Freq: Every day | ORAL | Status: DC
  Administered 2024-10-14 – 2024-10-15 (×2): 40 mg via ORAL
  Filled 2024-10-14 (×2): qty 1

## 2024-10-14 MED ORDER — INSULIN ASPART 100 UNIT/ML IJ SOLN
0.0000 [IU] | INTRAMUSCULAR | Status: DC
  Administered 2024-10-15: 1 [IU] via SUBCUTANEOUS
  Filled 2024-10-14 (×2): qty 1

## 2024-10-14 MED ORDER — MAGNESIUM SULFATE 2 GM/50ML IV SOLN
2.0000 g | Freq: Once | INTRAVENOUS | Status: AC
Start: 2024-10-14 — End: 2024-10-14
  Administered 2024-10-14: 2 g via INTRAVENOUS
  Filled 2024-10-14: qty 50

## 2024-10-14 MED ORDER — APIXABAN 5 MG PO TABS
5.0000 mg | ORAL_TABLET | Freq: Two times a day (BID) | ORAL | Status: DC
  Administered 2024-10-14 – 2024-10-15 (×2): 5 mg via ORAL
  Filled 2024-10-14 (×2): qty 1

## 2024-10-14 MED ORDER — METOPROLOL TARTRATE 50 MG PO TABS
50.0000 mg | ORAL_TABLET | Freq: Two times a day (BID) | ORAL | Status: DC
  Administered 2024-10-14 – 2024-10-15 (×3): 50 mg via ORAL
  Filled 2024-10-14: qty 1
  Filled 2024-10-14: qty 2
  Filled 2024-10-14: qty 1

## 2024-10-14 NOTE — Plan of Care (Signed)
  Problem: Education: Goal: Ability to describe self-care measures that may prevent or decrease complications (Diabetes Survival Skills Education) will improve Outcome: Progressing   Problem: Coping: Goal: Ability to adjust to condition or change in health will improve Outcome: Progressing   Problem: Education: Goal: Knowledge of General Education information will improve Description: Including pain rating scale, medication(s)/side effects and non-pharmacologic comfort measures Outcome: Progressing   Problem: Activity: Goal: Risk for activity intolerance will decrease 10/14/2024 2249 by Edith Darice BROCKS, RN Outcome: Progressing 10/14/2024 2239 by Edith Darice BROCKS, RN Outcome: Progressing   Problem: Coping: Goal: Level of anxiety will decrease Outcome: Progressing   Problem: Safety: Goal: Ability to remain free from injury will improve Outcome: Progressing

## 2024-10-14 NOTE — Progress Notes (Signed)
 Courtesy visit- No billing:  Patient is seen and examined today morning.  He is admitted to the hospitalist service for evaluation of shortness of breath, had DCCV procedure yesterday. Patient's shortness of breath much improved this morning, he is on 2 L supplemental oxygen .  Plan to continue IV Lasix  therapy 40 mg twice daily, cardiology recommended ischemic evaluation but as patient would like to hold off at this time advised to start beta-blockers, optimizes pharmacological therapies prior to discharge with outpatient cardiology follow-up.  Continue to monitor daily weights, strict input and output.  Patient will be continued on Eliquis  therapy.  Further management as per clinical course.

## 2024-10-14 NOTE — Consult Note (Signed)
 Cardiology Consultation   Patient ID: Jeremiah [name] MRN: [id number]; DOB: Jan 25, 1946  Admit date: 10/13/2024 Date of Consult: 10/14/2024  PCP:  Regino Slater, MD   [facility] Providers Cardiologist:  Dr. Mona   Electrophysiologist:  Will Gladis Norton, MD   Patient Profile: Jeremiah [name] is a 78 y.o. male with a hx of prior history of CAD status post CABG 2006, mitral stenosis, persistent atrial fibrillation status post recent DCCV, CHB status post PPM 08/2023, SVT status post ablation, hypertension, hyperlipidemia, COPD, PAD, GI bleed status post retrograde double-balloon with cauterization of AVM 01/2024, mesenteric ischemia status post SMA and celiac artery stenting, who is being seen 10/14/2024 for the evaluation of elevated troponin, CHF at the request of Dr. Darci.  History of Present Illness: Mr. Schubring has extensive cardiac history.  Prior history of CAD status post CABG with LIMA to LAD, SVG to OM1, SVG to RPDA.  Over the years has done well since last cardiac catheterization was 05/2016 showing widely patent grafts.  Symptoms of chest pain at the time felt related to SVT.  Eventually underwent ablation 08/2016.  PPM implanted 08/2023 for complete heart block.  Additionally has history of atrial fibrillation that was noted in 2024 with mitral stenosis.  Unfortunately because he had history of GI bleeding with hospitalization at [facility] for mesenteric ischemia had underwent left brachial artery cutdown and mesenteric artery and celiac artery stenting.  He was in sinus at the time.  He did have TEE during that admission which demonstrated degenerative mitral valve with mild MR.  He had severe mitral stenosis with possibly low gradient and diffusely calcified aortic valve consistent with moderate stenosis.  However given his bleeding risk and comorbidities he has not been felt to be good surgical candidate for Watchman device or valve intervention.  This has also limited his  treatment options for his PAD which he is followed by Dr. Court.  His bleeding issue was persistent throughout 2024 with multiple admissions with anemia and followed by oncology.  However in 01/2024 he eventually underwent retrograde double-balloon with cauterization of an AVM and has not had any GI bleeding since then.  He is followed regularly by oncology and GI at [facility].  More recently, he was felt to be acceptable bleeding risk and Eliquis  was restarted 08/2024.  He was felt to be paroxysmal atrial fibrillation but 10/31 was noted to be in atrial fibrillation and underwent DCCV yesterday with successful restoration to sinus rhythm.  Dr. Court recently saw the patient as well noting known SFA disease with recent Doppler showing progression and nearly occluded distal left SFA.  Decision was to retrial Pletal  with considerations of endovascular therapy if no significant improvement.  Currently patient being evaluated for progressive shortness of breath after his cardioversion yesterday. He was treated as acute respiratory failure requiring significant supplemental oxygen  and noted to be tachycardic with heart rates 180s and given diltiazem by EMS.  On arrival was in sinus tachycardia with heart rate 120.  Did require BiPAP but has been weaned down to supplemental oxygen .  BNP 119. Chest x-ray with pulmonary congestion and started on IV Lasix .  He did have elevated troponin 40-110-138 and treated as NSTEMI and started on IV heparin .  Patient reports that typically he does reasonably well and functional, able to complete his ADLs and only really limited by his claudication symptoms.  Since starting Pletal  he has had improvement in walking distance and can walk from [employer] back when before he could only make  it about halfway.  He does report diarrhea since starting the medication.  Typically has not had any significant shortness of breath or peripheral edema, no orthopnea, no fatigue.  Reports weight gain but no  acute change in the last month or so but in the past 6 months has gained about 20 pounds or so.  He reports no chest pain now and feels back to baseline.  He has been compliant with all of his medications and has not missed any doses of Eliquis .  He reports he is followed by pulmonology and has had COPD exacerbations before but has been more controlled recently.  As of right now not offering any further complaints.  Potassium 4.4.  Creatinine 1.10.  Magnesium 3.1.  Hemoglobin 1.3.  Platelets 188.  Past Medical History:  Diagnosis Date   A-fib [facility])    Acute on chronic systolic (congestive) heart failure (HCC) 06/10/2017   Acute pulmonary edema (HCC)    Anemia    Aortic valve sclerosis 12/2018   Noted on ECHO   Arthritis    CAD (coronary artery disease)    a. 2006: CABG in 2006 with LIMA-LAD, SVG-OM1, and SVG-RPDA   Cardiomegaly 12/2018   Stable, noted on CXR   Carpal tunnel syndrome    Right   Chronic pain 03/21/2016   Colon cancer (HCC) 2006   COPD (chronic obstructive pulmonary disease) (HCC)    Diabetes mellitus without complication (HCC)    DVT (deep venous thrombosis) (HCC)    Right   Essential hypertension 03/21/2016   GERD (gastroesophageal reflux disease)    History of blood transfusion    History of Clostridioides difficile infection    History of prostate cancer 03/21/2016   History of PSVT (paroxysmal supraventricular tachycardia)    HLD (hyperlipidemia)    HTN (hypertension)    Hx of CABG 2006   Hypercholesteremia 03/21/2016   Hypothyroidism    LAE (left atrial enlargement) 12/2018   Severe, Noted on ECHO   LVH (left ventricular hypertrophy) 12/2018   Mild, Noted on ECHO   Medication management 03/21/2016   Mitral annular calcification 12/2018   with mild MS, Noted on ECHO   Morbid obesity (HCC) 03/21/2016   Myocardial infarct (HCC)    OSA (obstructive sleep apnea) 03/21/2016   uses oxygen  at night time   Pain in right ankle and joints of right foot  03/21/2016   Paronychia 03/21/2016   Peripheral vascular disease    Phimosis    Pneumonia    Primary insomnia 03/21/2016   Prostate cancer (HCC) 2008   Pulmonary hypertension (HCC) 12/2018   Moderate, Noted on ECHO   Tobacco dependence 03/21/2016   Tricuspid regurgitation 12/2018   Mild, Noted on ECHO    Past Surgical History:  Procedure Laterality Date   ANKLE SURGERY Right 12/2013   CARDIAC CATHETERIZATION N/A 05/08/2016   Procedure: Left Heart Cath and Cors/Grafts Angiography;  Surgeon: Victory LELON Sharps, MD;  Location: [facility] INVASIVE CV LAB;  Service: Cardiovascular;  Laterality: N/A;   CARDIOVERSION N/A 10/13/2024   Procedure: CARDIOVERSION;  Surgeon: Floretta Mallard, MD;  Location: [facility] INVASIVE CV LAB;  Service: Cardiovascular;  Laterality: N/A;   CIRCUMCISION N/A 10/07/2019   Procedure: CIRCUMCISION ADULT;  Surgeon: Cam Morene LELON, MD;  Location: [facility] ORS;  Service: Urology;  Laterality: N/A;   CORONARY ARTERY BYPASS GRAFT  2006   x3   ELECTROPHYSIOLOGIC STUDY N/A 08/24/2016   Procedure: SVT Ablation;  Surgeon: Will Gladis Norton, MD;  Location: [facility] INVASIVE CV  LAB;  Service: Cardiovascular;  Laterality: N/A;   PACEMAKER IMPLANT N/A 08/23/2023   Procedure: PACEMAKER IMPLANT;  Surgeon: Inocencio Soyla Lunger, MD;  Location: [facility] INVASIVE CV LAB;  Service: Cardiovascular;  Laterality: N/A;   PROSTATE SURGERY  2008   PTCA     UMBILICAL HERNIA REPAIR N/A 05/26/2022   Procedure: PRIMARY REPAIR OF STRANGULATED UMBILICAL HERNIA WITH PARTIAL OMENTECTOMY;  Surgeon: Aron Shoulders, MD;  Location: [facility] OR;  Service: General;  Laterality: N/A;     Scheduled Meds:  apixaban   5 mg Oral BID   atorvastatin   80 mg Oral Daily   furosemide   40 mg Intravenous BID   insulin  aspart  0-6 Units Subcutaneous Q4H   levothyroxine   88 mcg Oral Q0600   metoprolol  tartrate  50 mg Oral BID   pantoprazole   40 mg Oral Daily   potassium chloride   20 mEq Oral Daily   sodium chloride  flush  3 mL Intravenous Q12H    spironolactone   25 mg Oral Daily   Continuous Infusions:   PRN Meds: acetaminophen  **OR** acetaminophen , fentaNYL  (SUBLIMAZE ) injection, ipratropium-albuterol , nitroGLYCERIN, oxyCODONE , senna-docusate  Allergies:    Allergies  Allergen Reactions   Shellfish Allergy Shortness Of Breath, Nausea And Vomiting and Rash    Scallops     Fluzone [Influenza Virus Vaccine] Nausea And Vomiting, Palpitations and Rash   Keflex  [Cephalexin ] Rash    Itching and rash on face.     Social History:   Social History   Socioeconomic History   Marital status: Married    Spouse name: Not on file   Number of children: 0   Years of education: Not on file   Highest education level: Not on file  Occupational History   Occupation: retired - direct [employer]  Tobacco Use   Smoking status: Former    Current packs/day: 0.00    Average packs/day: 0.8 packs/day for 60.0 years (45.0 ttl pk-yrs)    Types: Cigarettes    Start date: 12/15/1956    Quit date: 12/15/2016    Years since quitting: 7.8   Smokeless tobacco: Never   Tobacco comments:    Former smoker 08/27/24  Vaping Use   Vaping status: Never Used  Substance and Sexual Activity   Alcohol use: Not Currently    Comment: very seldom   Drug use: No   Sexual activity: Not Currently  Other Topics Concern   Not on file  Social History Narrative   Retired, married, no children      epworth sleepiness scale = 9 (04/13/16) (has OSA)   Social Drivers of Corporate Investment Banker Strain: Not on file  Food Insecurity: No Food Insecurity (10/14/2024)   Hunger Vital Sign    Worried About Running Out of Food in the Last Year: Never true    Ran Out of Food in the Last Year: Never true  Transportation Needs: No Transportation Needs (10/14/2024)   PRAPARE - Administrator, Civil Service (Medical): No    Lack of Transportation (Non-Medical): No  Physical Activity: Not on file  Stress: Not on file  Social Connections: Moderately Isolated  (10/14/2024)   Social Connection and Isolation Panel    Frequency of Communication with Friends and Family: More than three times a week    Frequency of Social Gatherings with Friends and Family: Three times a week    Attends Religious Services: Never    Active Member of Clubs or Organizations: No    Attends Banker Meetings: Never  Marital Status: Married  Catering Manager Violence: Not At Risk (10/14/2024)   Humiliation, Afraid, Rape, and Kick questionnaire    Fear of Current or Ex-Partner: No    Emotionally Abused: No    Physically Abused: No    Sexually Abused: No    Family History:   Family History  Problem Relation Age of Onset   Dementia Mother    Diabetes Sister      ROS:  Please see the history of present illness.  All other ROS reviewed and negative.     Physical Exam/Data: Vitals:   10/14/24 1200 10/14/24 1215 10/14/24 1219 10/14/24 1230  BP: (!) 140/78 (!) 116/56  119/66  Pulse: 83 (!) 107  95  Resp: (!) 21 14  20   Temp:   97.8 F (36.6 C)   TempSrc:   Oral   SpO2: 97% 98%  96%    Intake/Output Summary (Last 24 hours) at 10/14/2024 1313 Last data filed at 10/14/2024 1311 Gross per 24 hour  Intake --  Output 1450 ml  Net -1450 ml      10/02/2024    2:18 PM 10/02/2024   10:09 AM 09/23/2024   11:38 AM  Last 3 Weights  Weight (lbs) 221 lb 6.4 oz 220 lb 6.4 oz 219 lb 14.4 oz  Weight (kg) 100.426 kg 99.973 kg 99.746 kg     There is no height or weight on file to calculate BMI.  General:  Well nourished, well developed, in no acute distress.  On supplemental oxygen  HEENT: normal Neck: no JVD Vascular: No carotid bruits; Distal pulses 2+ bilaterally Cardiac:  normal S1, S2; 3 out of 6 murmur apex Lungs:  clear to auscultation bilaterally, no wheezing, rhonchi or rales  Abd: soft, nontender, no hepatomegaly  Ext: Mild edema Musculoskeletal:  No deformities, BUE and BLE strength normal and equal Skin: warm and dry  Neuro:  CNs 2-12  intact, no focal abnormalities noted Psych:  Normal affect   EKG:  The EKG was personally reviewed and demonstrates: Sinus rhythm heart rate 88.  Inferior T wave inversions, chronic. Telemetry:  Telemetry was personally reviewed and demonstrates: Sinus rhythm, PVCs  Relevant CV Studies: Echocardiogram 01/2024  1. Left ventricular ejection fraction, by estimation, is 55 to 60%. The  left ventricle has normal function. The left ventricle has no regional  wall motion abnormalities. There is moderate concentric left ventricular  hypertrophy. Left ventricular  diastolic parameters are indeterminate.   2. Right ventricular systolic function is normal. The right ventricular  size is normal. There is mildly elevated pulmonary artery systolic  pressure.   3. Left atrial size was severely dilated.   4. Mitral Valve Area (MVA) = 1.12 cm2 by continuity equation. The mitral  valve is degenerative. No evidence of mitral valve regurgitation. Moderate  to severe mitral stenosis. Severe mitral annular calcification.   5. The aortic valve is calcified. Aortic valve regurgitation is not  visualized. Aortic valve sclerosis/calcification is present, without any  evidence of aortic stenosis.   6. Aortic dilatation noted.   7. The inferior vena cava is normal in size with greater than 50%  respiratory variability, suggesting right atrial pressure of 3 mmHg.   Comparison(s): Prior images reviewed side by side. Prior study  characterized the valve as a functionally bicuspid valve- this morprology  is likely true but more difficult to evaluate on this study. Gradients  have increased by at higher heart rate.   Laboratory Data: High Sensitivity Troponin:   Recent  Labs  Lab 10/13/24 2308 10/14/24 0128 10/14/24 0425 10/14/24 1131  TROPONINIHS 40* 110* 138* 141*     Chemistry Recent Labs  Lab 10/13/24 2308 10/14/24 0425  NA 141 140  K 3.8 4.4  CL 101 99  CO2 25 26  GLUCOSE 184* 127*  BUN 16 17   CREATININE 0.98 1.10  CALCIUM  8.5* 8.5*  MG 1.8 3.1*  GFRNONAA >60 >60  ANIONGAP 15 15    No results for input(s): PROT, ALBUMIN, AST, ALT, ALKPHOS, BILITOT in the last 168 hours. Lipids No results for input(s): CHOL, TRIG, HDL, LABVLDL, LDLCALC, CHOLHDL in the last 168 hours.  Hematology Recent Labs  Lab 10/13/24 2308 10/14/24 0425  WBC 9.6 5.6  RBC 3.68* 3.55*  HGB 11.8* 11.3*  HCT 36.5* 35.4*  MCV 99.2 99.7  MCH 32.1 31.8  MCHC 32.3 31.9  RDW 15.2 15.4  PLT 186 188   Thyroid   Recent Labs  Lab 10/13/24 2308 10/13/24 2309  TSH  --  3.833  FREET4 0.97  --     BNP Recent Labs  Lab 10/13/24 2308  BNP 119.1*    DDimer No results for input(s): DDIMER in the last 168 hours.  Radiology/Studies:  ECHOCARDIOGRAM COMPLETE Result Date: 10/14/2024    ECHOCARDIOGRAM REPORT   Patient Name:   Jeremiah [name] Date of Exam: 10/14/2024 Medical Rec #:  [id number]   Height:       68.0 in Accession #:    [id number]  Weight:       221.4 lb Date of Birth:  06-06-1946    BSA:          2.134 m Patient Age:    78 years    BP:           110/73 mmHg Patient Gender: M           HR:           91 bpm. Exam Location:  Inpatient Procedure: 2D Echo, Cardiac Doppler, Color Doppler and Intracardiac            Opacification Agent (Both Spectral and Color Flow Doppler were            utilized during procedure). Indications:    CHF- Acute Diastolic I50.31                 Elevated troponin  History:        Patient has prior history of Echocardiogram examinations, most                 recent 01/31/2024. HFpEF, CAD, Prior CABG and Pacemaker, Mitral                 Valve Disease; Risk Factors:Diabetes, Dyslipidemia and                 Hypertension.  Sonographer:    Koleen Popper RDCS Referring Phys: EVALENE RAMAN OPYD  Sonographer Comments: Patient is obese. Image acquisition challenging due to patient body habitus. IMPRESSIONS  1. Left ventricular ejection fraction, by estimation, is 55 to 60%.  The left ventricle has normal function. The left ventricle has no regional wall motion abnormalities. Left ventricular diastolic function could not be evaluated.  2. Right ventricular systolic function is mildly reduced. The right ventricular size is normal. There is normal pulmonary artery systolic pressure.  3. Left atrial size was moderately dilated.  4. The mitral valve is degenerative. Mild mitral valve regurgitation. Mild mitral stenosis. The mean mitral valve gradient is  11.0 mmHg. Severe mitral annular calcification.  5. The aortic valve is tricuspid. There is moderate calcification of the aortic valve. Aortic valve regurgitation is not visualized. Mild aortic valve stenosis. Aortic valve area, by VTI measures 2.01 cm. Aortic valve mean gradient measures 8.0 mmHg. Aortic valve Vmax measures 2.00 m/s.  6. The inferior vena cava is normal in size with greater than 50% respiratory variability, suggesting right atrial pressure of 3 mmHg. FINDINGS  Left Ventricle: Left ventricular ejection fraction, by estimation, is 55 to 60%. The left ventricle has normal function. The left ventricle has no regional wall motion abnormalities. Definity  contrast agent was given IV to delineate the left ventricular  endocardial borders. The left ventricular internal cavity size was normal in size. There is no left ventricular hypertrophy. Left ventricular diastolic function could not be evaluated due to mitral annular calcification (moderate or greater). Left ventricular diastolic function could not be evaluated. Right Ventricle: The right ventricular size is normal. No increase in right ventricular wall thickness. Right ventricular systolic function is mildly reduced. There is normal pulmonary artery systolic pressure. The tricuspid regurgitant velocity is 2.75 m/s, and with an assumed right atrial pressure of 3 mmHg, the estimated right ventricular systolic pressure is 33.2 mmHg. Left [facility]: Left atrial size was moderately  dilated. Right [facility]: Right atrial size was normal in size. Pericardium: There is no evidence of pericardial effusion. Mitral Valve: Elevated gradient but near normal valve area by PHT (1.89cm2) so suspect due more to heart rate and severe MAC. The mitral valve is degenerative in appearance. Severe mitral annular calcification. Mild mitral valve regurgitation. Mild mitral valve stenosis. MV peak gradient, 17.1 mmHg. The mean mitral valve gradient is 11.0 mmHg. Tricuspid Valve: The tricuspid valve is normal in structure. Tricuspid valve regurgitation is mild . No evidence of tricuspid stenosis. Aortic Valve: The aortic valve is tricuspid. There is moderate calcification of the aortic valve. Aortic valve regurgitation is not visualized. Mild aortic stenosis is present. Aortic valve mean gradient measures 8.0 mmHg. Aortic valve peak gradient measures 16.0 mmHg. Aortic valve area, by VTI measures 2.01 cm. Pulmonic Valve: The pulmonic valve was normal in structure. Pulmonic valve regurgitation is not visualized. No evidence of pulmonic stenosis. Aorta: The aortic root is normal in size and structure. Venous: The inferior vena cava is normal in size with greater than 50% respiratory variability, suggesting right atrial pressure of 3 mmHg. IAS/Shunts: No atrial level shunt detected by color flow Doppler.  LEFT VENTRICLE PLAX 2D LVIDd:         4.10 cm   Diastology LVIDs:         3.00 cm   LV e' medial:    4.90 cm/s LV PW:         1.20 cm   LV E/e' medial:  41.5 LV IVS:        1.30 cm   LV e' lateral:   7.97 cm/s LVOT diam:     2.20 cm   LV E/e' lateral: 25.5 LV SV:         77 LV SV Index:   36 LVOT Area:     3.80 cm  RIGHT VENTRICLE            IVC RV S prime:     7.28 cm/s  IVC diam: 2.20 cm TAPSE (M-mode): 1.2 cm LEFT ATRIUM             Index LA diam:        6.40 cm  3.00 cm/m LA Vol (A2C):   57.8 ml 27.09 ml/m LA Vol (A4C):   92.2 ml 43.21 ml/m LA Biplane Vol: 75.0 ml 35.15 ml/m  AORTIC VALVE AV Area (Vmax):    2.19  cm AV Area (Vmean):   2.14 cm AV Area (VTI):     2.01 cm AV Vmax:           200.00 cm/s AV Vmean:          132.000 cm/s AV VTI:            0.383 m AV Peak Grad:      16.0 mmHg AV Mean Grad:      8.0 mmHg LVOT Vmax:         115.00 cm/s LVOT Vmean:        74.400 cm/s LVOT VTI:          0.203 m LVOT/AV VTI ratio: 0.53  AORTA Ao Root diam: 3.20 cm Ao Asc diam:  3.80 cm MITRAL VALVE                TRICUSPID VALVE MV Area (PHT): 2.58 cm     TR Peak grad:   30.2 mmHg MV Area VTI:   1.15 cm     TR Vmax:        275.00 cm/s MV Peak grad:  17.1 mmHg MV Mean grad:  11.0 mmHg    SHUNTS MV Vmax:       2.07 m/s     Systemic VTI:  0.20 m MV Vmean:      158.0 cm/s   Systemic Diam: 2.20 cm MV Decel Time: 294 msec MR Peak grad: 109.8 mmHg MR Vmax:      524.00 cm/s MV E velocity: 203.50 cm/s MV A velocity: 126.50 cm/s MV E/A ratio:  1.61 Morene Brownie Electronically signed by Morene Brownie Signature Date/Time: 10/14/2024/11:44:19 AM    Final    DG Chest Portable 1 View Result Date: 10/13/2024 EXAM: PA AND LATERAL (2) VIEW(S) XRAY OF THE CHEST 10/13/2024 11:15:00 PM COMPARISON: PA and lateral chest 12/11/2023. CLINICAL HISTORY: cp cp FINDINGS: LINES, TUBES AND DEVICES: A left chest Dual lead pacing system with stable wire insertions. LUNGS AND PLEURA: There is an interval increased perihilar vascular congestion with increased interstitial edema in the mid to lower lung fields and formation of small pleural effusions. There is haziness in the lung bases, which could be atelectasis or ground-glass edema, and less likely pneumonitis. No other focal airspace opacities are seen. The lungs are emphysematous. Findings consistent with CHF or fluid overload. No pneumothorax. HEART AND MEDIASTINUM: The heart is enlarged. Sternotomy and CABG changes are again noted. There is a stable mediastinum with aortic tortuosity and calcification. BONES AND SOFT TISSUES: No acute osseous abnormality. IMPRESSION: 1. Interval increased perihilar  vascular congestion, interstitial edema, and small pleural effusions, consistent with CHF or fluid overload. 2. Cardiomegaly. 3. Aortic atherosclerosis. Electronically signed by: Francis Quam MD 10/13/2024 11:23 PM EST RP Workstation: [id number]   EP STUDY Result Date: 10/13/2024 See surgical note for result.    Assessment and Plan:  Elevated troponin He is reporting no chest pain.  Here with acute respiratory failure after DCCV yesterday with acute HFpEF with signs of vascular congestion on imaging.  He has been weaned off of BiPAP and given IV Lasix  and feeling much better.  Serial EKG with no acute ST-T wave changes, chronically has T wave inversions inferiorly.  Troponins [id number].  Presentation may be type II event secondary  to acute HFpEF exacerbation.  He does have known CAD which with patent grafts in 2017 but certaintly may have had progression causing demand ischemia.  Obtain echocardiogram Would be cautious about pursuing with cardiac catheterization given significant GI bleed history.  This seems resolved but he certainly would be high risk for bleeding events as he would also need to be on triple therapy should he have any intervention. MD to discuss with patient.  Okay to continue to treat with IV heparin  for now until echocardiogram results and wall motion abnormalities can be assessed.  Acute HFpEF He has been weaned off of BiPAP and now feels back at baseline.  Overall looks to be euvolemic.   Checking echocardiogram. Okay to continue IV Lasix  40 mg twice daily for today, would reassess symptoms tomorrow but suspect he does not need much more diuresis. Continue spironolactone  25 mg daily  Persistent atrial fibrillation He was cardioverted yesterday, still maintaining sinus rhythm.  LA severely dilated. On Eliquis   TSH normal. No missed doses of Eliquis .  Mitral stenosis Echocardiogram 01/2024 shows EF 55 to 60% with moderate concentric LVH and normal RV function.  MVA  1.12 with a mean gradient of 11.  He has severe MAC so likely not be a candidate for [facility]. Per EMR, previously felt to be a surgical candidate.  Did not have any mitral regurgitation and any significant aortic disease.  Will need to discuss with MD about long-term plans. At DC would consider bisoprolol  to improve diastolic filling.   CAD status post CABG x 3 05/2016 he had widely patent grafts.  See above. Continue atorvastatin  80 mg  CHB status post PPM Stable device function on interrogation 08/2024.  SVT status post ablation Continue to monitor on telemetry, heart rates 180s per EMS.  No significant SVT noted on telemetry here.  COPD Generally only requires nocturnal oxygen  and followed by pulmonology.  Continue to wean oxygen .  PAD Mesenteric ischemia status post SMA/celiac stenting Progressive disease and followed by Dr. Court with attempt to manage with pletal  which he has had good response to.  Some consideration of endovascular intervention if medical therapy fails.  Agree with holding Pletal  with CHF concerns and bleeding hx.   GI bleed history Seems to be resolved issue but significant and extensive history before.  01/2024 he eventually underwent retrograde double-balloon with cauterization of an AVM and has not had any GI bleeding since then.   He is followed regularly by oncology and GI at [facility].   If felt to be at resolved and at acceptable bleeding risk this will open up much more options from a cardiovascular standpoint..   Risk Assessment/Risk Scores:  New [place]  Heart Association (NYHA) Functional Class NYHA Class II  CHA2DS2-VASc Score = 4   This indicates a 4.8% annual risk of stroke. The patient's score is based upon: CHF History: 0 HTN History: 1 Diabetes History: 0 Stroke History: 0 Vascular Disease History: 1 Age Score: 2 Gender Score: 0     For questions or updates, please contact [facility] Please consult [email or web address] for contact info  under      Signed, Thom LITTIE Sluder, PA-C  10/14/2024   ADDENDUM:   Patient seen and examined with SHENG HALEY, PA-C.  I personally taken a history, examined the patient, reviewed relevant notes,  laboratory data / imaging studies.  I performed a substantive portion of this encounter and formulated the important aspects of the plan.  I agree with the APP's note, impression, and  recommendations; however, I have edited the note to reflect changes or salient points.   Very pleasant 78 year old Caucasian gentleman who is accompanied by his wife at bedside. Seen in ER bed 7. Very complex cardiovascular history as outlined above which includes but not limited to CAD status post CABG, heart failure, history of SVT status post ablation, persistent atrial fibrillation status post cardioversion, and aortic and valvular heart disease.  Patient was recently diagnosed with atrial fibrillation and placed on anticoagulation after shared decision making between him and his providers.  After being appropriately anticoagulated he was scheduled for direct-current cardioversion yesterday 10/13/2024.  He underwent direct-current cardioversion 360 J x 1 and converted to sinus rhythm and later discharged home.  Upon reaching home he was at baseline.  However at night started experiencing shortness of breath.  He uses 4 L nasal cannula oxygen  at night and therefore he increased his oxygen  supplementation up to 10 L without any significant improvement in his dyspnea.  He started experiencing chest tightness.  EMS was called.  He received additional supplemental oxygenation and parenteral medication as he was noted to be in A-fib with RVR.  According to EMS documentation he converted back to sinus rhythm shortly thereafter and his symptoms continue to improve.  He was briefly placed on BiPAP in the ED. and currently on nasal cannula oxygen .  During morning rounds he denies any chest pain, pressure, or tightness. EKG  notes sinus rhythm with ST-T changes in the inferior leads. Cardiac biomarkers are elevated. Patient ruled in for NSTEMI/HFpEF exacerbation likely precipitated by A-fib with RVR and supply demand ischemia.  Cardiology consulted for further recommendations.  PHYSICAL EXAM: Today's Vitals   10/14/24 1215 10/14/24 1219 10/14/24 1230 10/14/24 1246  BP: (!) 116/56  119/66   Pulse: (!) 107  95   Resp: 14  20   Temp:  97.8 F (36.6 C)    TempSrc:  Oral    SpO2: 98%  96%   PainSc:    0-No pain   There is no height or weight on file to calculate BMI.   Net IO Since Admission: -1,450 mL [10/14/24 1313]  There were no vitals filed for this visit.  Physical Exam  Constitutional: No distress.  hemodynamically stable  Neck: No JVD present.  Cardiovascular: Regular rhythm, S1 normal and S2 normal. Tachycardia present. Exam reveals no gallop, no S3 and no S4.  Murmur heard. Crescendo-decrescendo systolic murmur is present with a grade of 3/6 at the upper right sternal border. Crescendo diastolic murmur is present with a grade of 3/4 at the apex. Pulses:      Radial pulses are 2+ on the right side and 2+ on the left side.  Bilateral lower extremities are warm.  Diminished pulses bilaterally.  Pulmonary/Chest: Effort normal and breath sounds normal. No stridor. He has no wheezes. He has no rales.  Decreased breath sounds bilaterally, nasal cannula oxygen   Abdominal: Soft. Bowel sounds are normal.  Abdominal obesity  Musculoskeletal:        General: No edema.     Cervical back: Neck supple.  Neurological: He is alert and oriented to person, place, and time.  Skin: Skin is warm and moist.    EKG: (personally reviewed by me) 10/13/2024: Sinus tachycardia, 119 bpm, prolonged QT, ST changes in inferolateral leads.  10/14/2024: Sinus rhythm, 88 bpm, QT segment has shortened, ST-T changes in the inferior leads cannot rule out ischemia.  Telemetry: (personally reviewed by me) Sinus  rhythm/sinus tachycardia  BNP (  last 3 results) Recent Labs    10/13/24 2308  BNP 119.1*   Cardiac Panel (last 3 results) Recent Labs    10/14/24 0128 10/14/24 0425 10/14/24 1131  TROPONINIHS 110* 138* 141*     Impression & Recommendations: :  NSTEMI Coronary artery disease  with history of CABG Currently denies anginal chest pain. When he presented to the ED patient was endorsing chest tightness likely precipitated by A-fib with RVR and underlying valvular heart disease. Last ischemic workup was a stress test back in 2021 which was reported to be low risk & heart cath 2017. Patient has ST depressions in the inferior leads noted on prior tracings as well - likely due to known occluded graft.  Slight elevation of cardiac biomarkers in the setting of A-fib with RVR and underlying valvular heart disease. Not suggestive of STEMI.  Given the fact that his ischemic workup was at least 4 years ago recommended left and right heart catheterization to reevaluate for obstructive disease and also to evaluate hemodynamics given his valvular heart disease.  However, patient would like to hold off on any ischemic workup at this time and discuss this further with his primary cardiologist Dr. Mona. Alternatives to left and right heart catheterization that we discussed was considering cardiac PET/CT or in hindsight could also consider MRI stress test protocol which could evaluate for ischemia as well as valvular heart disease. Will try to arrange outpatient follow-up with Dr. Mona next available If he has worsening chest pain will reconsider ischemic workup while inpatient. Both patient and wife agreeable with the plan of care Antianginal therapies: Start beta-blockers will give sublingual nitroglycerin tablets to use on appearing basis Reemphasized importance of improving his modifiable cardiovascular risk factors  Acute on chronic HFpEF Likely precipitated by ERAF after cardioversion and due to  valvular heart disease.  Currently shortness of breath is improved, warm extremities, and oxygen  requirements have gone down. But required BiPAP supplementation while in the ED. Currently on nasal cannula oxygen  Continue home medications. Will continue Lasix  40 mg twice daily Strict I's and O's and daily weights Echocardiogram from today notes preserved LVEF, diastolic dysfunction was not commented on due to severe MAC, but filling pressures suggestive of at least grade 2 diastolic dysfunction with elevated left atrial pressure E/e' 34 Will optimize his pharmacological therapy prior to discharge Net IO Since Admission: -1,450 mL [10/14/24 1321]  Persistent atrial fibrillation status post cardioversion 10/13/2024 Underwent cardioversion yesterday. When EMS arrived he was noted to be in A-fib with RVR. Converted back to sinus rhythm with Cardizem. Currently in sinus rhythm Currently not on AV nodal blocking agents Based on clinical examination he is not in overt heart failure, oxygen  supplementation has reduced, we will start low-dose Lopressor  and transition to Toprol -XL closer to discharge Currently on Eliquis  for thromboembolic prophylaxis Echocardiogram notes at least moderate mitral stenosis, will continue Eliquis  for now. But did discuss considering Coumadin as an alternative given his valvular heart disease and history of GI bleed making it easier for reversal if needed.  Patient will discuss with Dr. Mona  Mitral valve disease Degenerative/calcified mitral valve with severe MAC Nonrheumatic Echo from today notes a mean gradient of 11 mmHg, mitral valve VTI 66.6 cm, heart rate of 79 bpm, stroke-volume 77 mitral valve area per continuity equation 1.15 cm.  Suggestive of moderate to severe MS. Right ventricular size visually appears normal and function mildly reduced (TAPSE 12 mm, RVS prime 7.3 cm/s) Has been evaluated by structural heart team in  the past - reconsider follow up as  outpatient.  May benefit from reevaluating his hemodynamics with either right heart catheterization or cardiac MRI.  Peripheral vascular disease with claudication No active rest pain, claudication with prolonged ambulation (chronic and stable) Sees Dr. Court  Currently on cilostazol  50 mg p.o. twice daily, will hold for now given HFpEF Already on anticoagulation given his persistent A-fib Reemphasized importance of monitoring for bleeding given his history of GI bleed  History of GI bleed: Does not endorse evidence of bleeding.  Sleep apnea not on CPAP: Reemphasized the importance of having this addressed.  Medical Decision:  Complexity: High Interdisciplinary: Yes Independently reviewed: Echo, prior cath and stress results, progress notes from Dr. Mona and Dr. Wendel, labs since arrival, EKG Prescription drug management -recommendations stated above Family updated: Wife.   Further recommendations to follow as the case evolves.   This note was created using a voice recognition software as a result there may be grammatical errors inadvertently enclosed that do not reflect the nature of this encounter. Every attempt is made to correct such errors.   Madonna Michele HAS, [facility]  A Division of [facility] [address], [place], [place] [address]  Pager: [phone] Office: [phone] 10/14/2024 1:13 PM

## 2024-10-14 NOTE — Progress Notes (Signed)
  Echocardiogram 2D Echocardiogram has been performed.  Koleen KANDICE Popper, RDCS 10/14/2024, 10:55 AM

## 2024-10-14 NOTE — H&P (Signed)
 History and Physical    Jeremiah [name] FMW:[id number] DOB: May 12, 1946 DOA: 10/13/2024  PCP: Regino Slater, MD   Patient coming from: Home   Chief Complaint: SOB   HPI: Jeremiah [name] is a 78 y.o. male with medical history significant for hypertension, hypothyroidism, COPD, OSA, chronic hypoxic respiratory failure, heart block with pacemaker, CAD status post CABG in 2006, PAD, atrial fibrillation on Eliquis  status post DCCV yesterday, and chronic HFpEF who presents with acute respiratory failure.  Patient reports that he has been experiencing shortness of breath attributed to his atrial fibrillation, was hoping to experience some relief after successful DCCV yesterday, and was doing well back at home initially, but went on to develop fairly acute onset of severe dyspnea last night.  His wife at the bedside states that he was reporting chest pressure at that time though the patient denies that now.    He reports being unable to catch his breath, turned his supplemental oxygen  up to 10 LPM, continued to worsen, and then had EMS placed him on 15 L/min of supplemental oxygen  with transport to the hospital.  He was said to have heart rate in the 180s with EMS and was given 20 mg diltiazem.  He was in sinus tachycardia with rate 120s on arrival to the ED.  ED Course: Upon arrival to the ED, patient is found to be afebrile and saturating well on BiPAP with tachypnea, tachycardia, and stable BP.  Labs are most notable for normal creatinine, normal WBC, hemoglobin 11.8, BNP 119, and troponin of 40 which increased to 110 two hours later.  Chest x-ray demonstrates vascular congestion, interstitial edema, and small pleural effusions.  Patient was placed on BiPAP in the ED and treated with 80 mg IV Lasix .  Review of Systems:  All other systems reviewed and apart from HPI, are negative.  Past Medical History:  Diagnosis Date   A-fib [facility])    Acute on chronic systolic (congestive) heart failure (HCC) 06/10/2017    Acute pulmonary edema (HCC)    Anemia    Aortic valve sclerosis 12/2018   Noted on ECHO   Arthritis    CAD (coronary artery disease)    a. 2006: CABG in 2006 with LIMA-LAD, SVG-OM1, and SVG-RPDA   Cardiomegaly 12/2018   Stable, noted on CXR   Carpal tunnel syndrome    Right   Chronic pain 03/21/2016   Colon cancer (HCC) 2006   COPD (chronic obstructive pulmonary disease) (HCC)    Diabetes mellitus without complication (HCC)    DVT (deep venous thrombosis) (HCC)    Right   Essential hypertension 03/21/2016   GERD (gastroesophageal reflux disease)    History of blood transfusion    History of Clostridioides difficile infection    History of prostate cancer 03/21/2016   History of PSVT (paroxysmal supraventricular tachycardia)    HLD (hyperlipidemia)    HTN (hypertension)    Hx of CABG 2006   Hypercholesteremia 03/21/2016   Hypothyroidism    LAE (left atrial enlargement) 12/2018   Severe, Noted on ECHO   LVH (left ventricular hypertrophy) 12/2018   Mild, Noted on ECHO   Medication management 03/21/2016   Mitral annular calcification 12/2018   with mild MS, Noted on ECHO   Morbid obesity (HCC) 03/21/2016   Myocardial infarct (HCC)    OSA (obstructive sleep apnea) 03/21/2016   uses oxygen  at night time   Pain in right ankle and joints of right foot 03/21/2016   Paronychia 03/21/2016   Peripheral vascular disease  Phimosis    Pneumonia    Primary insomnia 03/21/2016   Prostate cancer (HCC) 2008   Pulmonary hypertension (HCC) 12/2018   Moderate, Noted on ECHO   Tobacco dependence 03/21/2016   Tricuspid regurgitation 12/2018   Mild, Noted on ECHO    Past Surgical History:  Procedure Laterality Date   ANKLE SURGERY Right 12/2013   CARDIAC CATHETERIZATION N/A 05/08/2016   Procedure: Left Heart Cath and Cors/Grafts Angiography;  Surgeon: Victory LELON Sharps, MD;  Location: [facility] INVASIVE CV LAB;  Service: Cardiovascular;  Laterality: N/A;   CARDIOVERSION N/A 10/13/2024    Procedure: CARDIOVERSION;  Surgeon: Floretta Mallard, MD;  Location: [facility] INVASIVE CV LAB;  Service: Cardiovascular;  Laterality: N/A;   CIRCUMCISION N/A 10/07/2019   Procedure: CIRCUMCISION ADULT;  Surgeon: Cam Morene LELON, MD;  Location: [facility] ORS;  Service: Urology;  Laterality: N/A;   CORONARY ARTERY BYPASS GRAFT  2006   x3   ELECTROPHYSIOLOGIC STUDY N/A 08/24/2016   Procedure: SVT Ablation;  Surgeon: Will Gladis Norton, MD;  Location: [facility] INVASIVE CV LAB;  Service: Cardiovascular;  Laterality: N/A;   PACEMAKER IMPLANT N/A 08/23/2023   Procedure: PACEMAKER IMPLANT;  Surgeon: Norton Soyla Gladis, MD;  Location: [facility] INVASIVE CV LAB;  Service: Cardiovascular;  Laterality: N/A;   PROSTATE SURGERY  2008   PTCA     UMBILICAL HERNIA REPAIR N/A 05/26/2022   Procedure: PRIMARY REPAIR OF STRANGULATED UMBILICAL HERNIA WITH PARTIAL OMENTECTOMY;  Surgeon: Aron Shoulders, MD;  Location: [facility] OR;  Service: General;  Laterality: N/A;    Social History:   reports that he quit smoking about 7 years ago. His smoking use included cigarettes. He started smoking about 67 years ago. He has a 45 pack-year smoking history. He has never used smokeless tobacco. He reports that he does not currently use alcohol. He reports that he does not use drugs.  Allergies  Allergen Reactions   Shellfish Allergy Shortness Of Breath, Nausea And Vomiting and Rash    Scallops     Fluzone [Influenza Virus Vaccine] Nausea And Vomiting, Palpitations and Rash   Keflex  [Cephalexin ] Rash    Itching and rash on face.     Family History  Problem Relation Age of Onset   Dementia Mother    Diabetes Sister      Prior to Admission medications   Medication Sig Start Date End Date Taking? Authorizing Provider  ondansetron  (ZOFRAN ) 4 MG tablet Take 4 mg by mouth every 8 (eight) hours as needed for vomiting or nausea. 12/20/23  Yes [provider]  apixaban  (ELIQUIS ) 5 MG TABS tablet Take 1 tablet (5 mg total) by mouth 2 (two)  times daily. 08/27/24   Terra Fairy PARAS, PA-C  atorvastatin  (LIPITOR ) 80 MG tablet Take 80 mg by mouth daily.    [provider]  benzonatate  (TESSALON ) 200 MG capsule TAKE 1 CAPSULE(200 MG) BY MOUTH THREE TIMES DAILY AS NEEDED 09/14/24   Parrett, Madelin RAMAN, NP  budesonide  (PULMICORT ) 0.5 MG/2ML nebulizer solution Take 2 mLs (0.5 mg total) by nebulization 2 (two) times daily. 03/10/24   Parrett, Madelin RAMAN, NP  cetirizine  (ZYRTEC ) 10 MG tablet Take 1 tablet (10 mg total) by mouth daily. 07/08/24   Hilty, Vinie BROCKS, MD  cilostazol  (PLETAL ) 50 MG tablet Take 1 tablet (50 mg total) by mouth 2 (two) times daily. 09/23/24   Court Dorn PARAS, MD  colchicine  0.6 MG tablet Take 0.6 mg by mouth daily as needed (Gout).    [provider]  dicyclomine  (BENTYL )  10 MG capsule Take 10 mg by mouth 3 (three) times daily before meals. 30 minutes before eating 02/19/23   [provider]  ezetimibe  (ZETIA ) 10 MG tablet Take 1 tablet (10 mg total) by mouth daily. 12/03/23   Hilty, Vinie BROCKS, MD  FeFum-FePoly-FA-B Cmp-C-Biot (INTEGRA PLUS ) CAPS Take 1 capsule by mouth every morning. 05/12/24   Heilingoetter, Cassandra L, PA-C  fluticasone  (FLONASE ) 50 MCG/ACT nasal spray Place 1 spray into both nostrils daily. 07/08/24   Mona Vinie BROCKS, MD  formoterol  (PERFOROMIST ) 20 MCG/2ML nebulizer solution Take 2 mLs (20 mcg total) by nebulization 2 (two) times daily. 03/10/24   Parrett, Madelin RAMAN, NP  furosemide  (LASIX ) 40 MG tablet Take 2 tablets (80 mg total) by mouth daily. 06/11/23   Fairy Frames, MD  glimepiride (AMARYL) 2 MG tablet Take 4 mg by mouth every morning. 08/28/23   [provider]  levothyroxine  (SYNTHROID , LEVOTHROID) 88 MCG tablet Take 88 mcg by mouth daily before breakfast. Take on an empty stomach    [provider]  metFORMIN (GLUCOPHAGE-XR) 500 MG 24 hr tablet Take 2,000 mg by mouth daily. 11/28/23   [provider]  omeprazole (PRILOSEC) 40 MG capsule Take 40 mg by  mouth daily.    [provider]  Potassium Chloride  ER 20 MEQ TBCR Take 20 mEq by mouth daily. 07/11/23   [provider]  revefenacin  (YUPELRI ) 175 MCG/3ML nebulizer solution Take 3 mLs (175 mcg total) by nebulization daily. 03/26/24   Parrett, Madelin RAMAN, NP  spironolactone  (ALDACTONE ) 25 MG tablet Take 1 tablet (25 mg total) by mouth daily. 06/12/23   Fairy Frames, MD    Physical Exam: Vitals:   10/13/24 2307 10/13/24 2345 10/14/24 0200 10/14/24 0300  BP:  102/60 118/63 137/75  Pulse:  90 78 98  Resp: (!) 26 17 14 17   Temp:      TempSrc:      SpO2:  100% 100% 95%    Constitutional: NAD, no diaphoresis  Eyes: PERTLA, lids and conjunctivae normal ENMT: Mucous membranes are moist. Posterior pharynx clear of any exudate or lesions.   Neck: supple, no masses  Respiratory: Dyspneic with speech. Labored respirations. No wheezing.  Cardiovascular: Rate ~100 and regular. Mild b/l lower extremity edema.  Abdomen: No tenderness, soft. Bowel sounds active.  Musculoskeletal: no clubbing / cyanosis. No joint deformity upper and lower extremities.   Skin: no significant rashes, lesions, ulcers. Warm, dry, well-perfused. Neurologic: CN 2-12 grossly intact. Moving all extremities. Alert and oriented.  Psychiatric: Calm. Cooperative.    Labs and Imaging on Admission: I have personally reviewed following labs and imaging studies  CBC: Recent Labs  Lab 10/13/24 2308  WBC 9.6  NEUTROABS 7.2  HGB 11.8*  HCT 36.5*  MCV 99.2  PLT 186   Basic Metabolic Panel: Recent Labs  Lab 10/13/24 2308  NA 141  K 3.8  CL 101  CO2 25  GLUCOSE 184*  BUN 16  CREATININE 0.98  CALCIUM  8.5*  MG 1.8   GFR: Estimated Creatinine Clearance: 72.5 mL/min (by C-G formula based on SCr of 0.98 mg/dL). Liver Function Tests: No results for input(s): AST, ALT, ALKPHOS, BILITOT, PROT, ALBUMIN in the last 168 hours. No results for input(s): LIPASE, AMYLASE in the last 168  hours. No results for input(s): AMMONIA in the last 168 hours. Coagulation Profile: No results for input(s): INR, PROTIME in the last 168 hours. Cardiac Enzymes: No results for input(s): CKTOTAL, CKMB, CKMBINDEX, TROPONINI in the last 168 hours.  BNP (last 3 results) No results for input(s): PROBNP in the last 8760 hours. HbA1C: No results for input(s): HGBA1C in the last 72 hours. CBG: No results for input(s): GLUCAP in the last 168 hours. Lipid Profile: No results for input(s): CHOL, HDL, LDLCALC, TRIG, CHOLHDL, LDLDIRECT in the last 72 hours. Thyroid  Function Tests: Recent Labs    10/13/24 2308 10/13/24 2309  TSH  --  3.833  FREET4 0.97  --    Anemia Panel: No results for input(s): VITAMINB12, FOLATE, FERRITIN, TIBC, IRON , RETICCTPCT in the last 72 hours. Urine analysis:    Component Value Date/Time   COLORURINE YELLOW 12/19/2023 1037   APPEARANCEUR CLEAR 12/19/2023 1037   LABSPEC 1.043 (H) 12/19/2023 1037   PHURINE 5.0 12/19/2023 1037   GLUCOSEU NEGATIVE 12/19/2023 1037   HGBUR NEGATIVE 12/19/2023 1037   BILIRUBINUR NEGATIVE 12/19/2023 1037   KETONESUR 5 (A) 12/19/2023 1037   PROTEINUR 30 (A) 12/19/2023 1037   NITRITE NEGATIVE 12/19/2023 1037   LEUKOCYTESUR NEGATIVE 12/19/2023 1037   Sepsis Labs: @LABRCNTIP (procalcitonin:4,lacticidven:4) )No results found for this or any previous visit (from the past 240 hours).   Radiological Exams on Admission: DG Chest Portable 1 View Result Date: 10/13/2024 EXAM: PA AND LATERAL (2) VIEW(S) XRAY OF THE CHEST 10/13/2024 11:15:00 PM COMPARISON: PA and lateral chest 12/11/2023. CLINICAL HISTORY: cp cp FINDINGS: LINES, TUBES AND DEVICES: A left chest Dual lead pacing system with stable wire insertions. LUNGS AND PLEURA: There is an interval increased perihilar vascular congestion with increased interstitial edema in the mid to lower lung fields and formation of small pleural effusions. There  is haziness in the lung bases, which could be atelectasis or ground-glass edema, and less likely pneumonitis. No other focal airspace opacities are seen. The lungs are emphysematous. Findings consistent with CHF or fluid overload. No pneumothorax. HEART AND MEDIASTINUM: The heart is enlarged. Sternotomy and CABG changes are again noted. There is a stable mediastinum with aortic tortuosity and calcification. BONES AND SOFT TISSUES: No acute osseous abnormality. IMPRESSION: 1. Interval increased perihilar vascular congestion, interstitial edema, and small pleural effusions, consistent with CHF or fluid overload. 2. Cardiomegaly. 3. Aortic atherosclerosis. Electronically signed by: Francis Quam MD 10/13/2024 11:23 PM EST RP Workstation: [id number]   EP STUDY Result Date: 10/13/2024 See surgical note for result.   EKG: Independently reviewed. Sinus tachycardia, rate 119, non-specific repolarization abnormality, QTc 508 ms.   Assessment/Plan   1. Acute on chronic HFpEF; acute on chronic hypoxic respiratory failure  - EF was 55-60% on echo from February 2025  - Continue diuresis with IV Lasix , continue supplemental O2, continue BiPAP as-needed, monitor weight and I/Os    2. NSTEMI  - Presents with acute worsening in SOB, told his wife he was having chest pressure but denies that now  - Troponin went from 40 to 110 two hours, repolarization abnormality noted on EKG, acute CHF findings on CXR  - Continue cardiac monitoring, trend troponin, repeat EKG, check echocardiogram, continue Lipitor , hold Eliquis  and use IV heparin  for now, will ask cardiology to consult in AM    3. Atrial fibrillation  - Underwent successful DCCV yesterday, remains in SR  - Continue anticoagulation    4. COPD; OSA   - Not in exacerbation  - Continue supplemental O2, encourage CPAP while sleeping    5. Type II DM  - A1c was 6.8% in May 2025  - Check CBGs and use low-intensity SSI for now    6. PAD  - No acute  ischemia  -  Hold Pletal  in light of CHF and prolonged QT interval    7. Hypothyroidism  - Synthroid     DVT prophylaxis: IV heparin   Code Status: Full   Level of Care: Level of care: Progressive Family Communication: Wife at bedside  Disposition Plan:  Patient is from: Home  Anticipated d/c is to: Home  Anticipated d/c date is: 10/16/24  Patient currently: Pending improved respiratory status, trend in troponin, echocardiogram  Consults called: Message sent to cardiology with request for Am consult  Admission status: Inpatient     Evalene GORMAN Sprinkles, MD [facility]  10/14/2024, 3:34 AM

## 2024-10-14 NOTE — Progress Notes (Signed)
 PHARMACY - ANTICOAGULATION CONSULT NOTE  Pharmacy Consult for Heparin  Indication: atrial fibrillation  Allergies  Allergen Reactions   Shellfish Allergy Shortness Of Breath, Nausea And Vomiting and Rash    Scallops     Fluzone [Influenza Virus Vaccine] Nausea And Vomiting, Palpitations and Rash   Keflex  [Cephalexin ] Rash    Itching and rash on face.     Patient Measurements:    Vital Signs: Temp: 98.2 F (36.8 C) (11/11 2243) Temp Source: Oral (11/11 2243) BP: 137/75 (11/12 0300) Pulse Rate: 98 (11/12 0300)  Labs: Recent Labs    10/13/24 2308 10/14/24 0128  HGB 11.8*  --   HCT 36.5*  --   PLT 186  --   CREATININE 0.98  --   TROPONINIHS 40* 110*    Estimated Creatinine Clearance: 72.5 mL/min (by C-G formula based on SCr of 0.98 mg/dL).   Medical History: Past Medical History:  Diagnosis Date   A-fib [facility])    Acute on chronic systolic (congestive) heart failure (HCC) 06/10/2017   Acute pulmonary edema (HCC)    Anemia    Aortic valve sclerosis 12/2018   Noted on ECHO   Arthritis    CAD (coronary artery disease)    a. 2006: CABG in 2006 with LIMA-LAD, SVG-OM1, and SVG-RPDA   Cardiomegaly 12/2018   Stable, noted on CXR   Carpal tunnel syndrome    Right   Chronic pain 03/21/2016   Colon cancer (HCC) 2006   COPD (chronic obstructive pulmonary disease) (HCC)    Diabetes mellitus without complication (HCC)    DVT (deep venous thrombosis) (HCC)    Right   Essential hypertension 03/21/2016   GERD (gastroesophageal reflux disease)    History of blood transfusion    History of Clostridioides difficile infection    History of prostate cancer 03/21/2016   History of PSVT (paroxysmal supraventricular tachycardia)    HLD (hyperlipidemia)    HTN (hypertension)    Hx of CABG 2006   Hypercholesteremia 03/21/2016   Hypothyroidism    LAE (left atrial enlargement) 12/2018   Severe, Noted on ECHO   LVH (left ventricular hypertrophy) 12/2018   Mild, Noted on ECHO    Medication management 03/21/2016   Mitral annular calcification 12/2018   with mild MS, Noted on ECHO   Morbid obesity (HCC) 03/21/2016   Myocardial infarct (HCC)    OSA (obstructive sleep apnea) 03/21/2016   uses oxygen  at night time   Pain in right ankle and joints of right foot 03/21/2016   Paronychia 03/21/2016   Peripheral vascular disease    Phimosis    Pneumonia    Primary insomnia 03/21/2016   Prostate cancer (HCC) 2008   Pulmonary hypertension (HCC) 12/2018   Moderate, Noted on ECHO   Tobacco dependence 03/21/2016   Tricuspid regurgitation 12/2018   Mild, Noted on ECHO    Medications:  Current Facility-Administered Medications on File Prior to Encounter  Medication Dose Route Frequency Provider Last Rate Last Admin   [DISCONTINUED] 0.9 %  sodium chloride  infusion   Intravenous Continuous Terra Fairy PARAS, PA-C       [DISCONTINUED] lidocaine  2% (20 mg/mL) 5 mL syringe   Intravenous Anesthesia Intra-op Lawal, Oluwaseyi, CRNA   50 mg at 10/13/24 1330   [DISCONTINUED] propofol  (DIPRIVAN ) 10 mg/mL bolus/IV push   Intravenous Anesthesia Intra-op Hedy Jarred, CRNA   50 mg at 10/13/24 1330   Current Outpatient Medications on File Prior to Encounter  Medication Sig Dispense Refill   ondansetron  (ZOFRAN ) 4 MG  tablet Take 4 mg by mouth every 8 (eight) hours as needed for vomiting or nausea.     apixaban  (ELIQUIS ) 5 MG TABS tablet Take 1 tablet (5 mg total) by mouth 2 (two) times daily. 60 tablet 6   atorvastatin  (LIPITOR ) 80 MG tablet Take 80 mg by mouth daily.     benzonatate  (TESSALON ) 200 MG capsule TAKE 1 CAPSULE(200 MG) BY MOUTH THREE TIMES DAILY AS NEEDED 45 capsule 3   budesonide  (PULMICORT ) 0.5 MG/2ML nebulizer solution Take 2 mLs (0.5 mg total) by nebulization 2 (two) times daily. 120 mL 11   cetirizine  (ZYRTEC ) 10 MG tablet Take 1 tablet (10 mg total) by mouth daily. 90 tablet 3   cilostazol  (PLETAL ) 50 MG tablet Take 1 tablet (50 mg total) by mouth 2 (two) times  daily. 180 tablet 1   colchicine  0.6 MG tablet Take 0.6 mg by mouth daily as needed (Gout).     dicyclomine  (BENTYL ) 10 MG capsule Take 10 mg by mouth 3 (three) times daily before meals. 30 minutes before eating     ezetimibe  (ZETIA ) 10 MG tablet Take 1 tablet (10 mg total) by mouth daily. 90 tablet 3   FeFum-FePoly-FA-B Cmp-C-Biot (INTEGRA PLUS ) CAPS Take 1 capsule by mouth every morning. 30 capsule 2   fluticasone  (FLONASE ) 50 MCG/ACT nasal spray Place 1 spray into both nostrils daily. 16 g 2   formoterol  (PERFOROMIST ) 20 MCG/2ML nebulizer solution Take 2 mLs (20 mcg total) by nebulization 2 (two) times daily. 120 mL 6   furosemide  (LASIX ) 40 MG tablet Take 2 tablets (80 mg total) by mouth daily. 30 tablet 0   glimepiride (AMARYL) 2 MG tablet Take 4 mg by mouth every morning.     levothyroxine  (SYNTHROID , LEVOTHROID) 88 MCG tablet Take 88 mcg by mouth daily before breakfast. Take on an empty stomach     metFORMIN (GLUCOPHAGE-XR) 500 MG 24 hr tablet Take 2,000 mg by mouth daily.     omeprazole (PRILOSEC) 40 MG capsule Take 40 mg by mouth daily.     Potassium Chloride  ER 20 MEQ TBCR Take 20 mEq by mouth daily.     revefenacin  (YUPELRI ) 175 MCG/3ML nebulizer solution Take 3 mLs (175 mcg total) by nebulization daily. 90 mL 5   spironolactone  (ALDACTONE ) 25 MG tablet Take 1 tablet (25 mg total) by mouth daily. 30 tablet 0     Assessment: 78 y.o. male admitted with chest pain and shortness of breath, h/o Afib and s/p cardioversion 11/11, Eliquis  on hold for heparin .  Last dose of Eliquis  at 0315   Goal of Therapy:  aPTT 66-102 sec Heparin  level 0.3-0.7 units/ml Monitor platelets by anticoagulation protocol: Yes   Plan:  Start heparin  1300 units/hr at 3 pm today Check aPTT in 8 hours   Yunique Dearcos, Cordella Misty 10/14/2024,3:38 AM

## 2024-10-14 NOTE — Progress Notes (Signed)
 Pt. Refusing BiPap as ordered at HS per RT. Pt. States he uses O2 via nasal cannula at beditme.

## 2024-10-14 NOTE — Plan of Care (Signed)
  Problem: Education: Goal: Ability to describe self-care measures that may prevent or decrease complications (Diabetes Survival Skills Education) will improve Outcome: Progressing   Problem: Coping: Goal: Ability to adjust to condition or change in health will improve Outcome: Progressing   Problem: Education: Goal: Knowledge of General Education information will improve Description: Including pain rating scale, medication(s)/side effects and non-pharmacologic comfort measures Outcome: Progressing   Problem: Activity: Goal: Risk for activity intolerance will decrease Outcome: Progressing

## 2024-10-15 ENCOUNTER — Other Ambulatory Visit (HOSPITAL_COMMUNITY): Payer: Self-pay

## 2024-10-15 DIAGNOSIS — I214 Non-ST elevation (NSTEMI) myocardial infarction: Secondary | ICD-10-CM | POA: Diagnosis not present

## 2024-10-15 DIAGNOSIS — I059 Rheumatic mitral valve disease, unspecified: Secondary | ICD-10-CM | POA: Diagnosis not present

## 2024-10-15 DIAGNOSIS — I2510 Atherosclerotic heart disease of native coronary artery without angina pectoris: Secondary | ICD-10-CM

## 2024-10-15 DIAGNOSIS — Z951 Presence of aortocoronary bypass graft: Secondary | ICD-10-CM

## 2024-10-15 DIAGNOSIS — Z8719 Personal history of other diseases of the digestive system: Secondary | ICD-10-CM

## 2024-10-15 DIAGNOSIS — I5033 Acute on chronic diastolic (congestive) heart failure: Secondary | ICD-10-CM | POA: Diagnosis not present

## 2024-10-15 DIAGNOSIS — E1165 Type 2 diabetes mellitus with hyperglycemia: Secondary | ICD-10-CM | POA: Diagnosis not present

## 2024-10-15 DIAGNOSIS — I739 Peripheral vascular disease, unspecified: Secondary | ICD-10-CM

## 2024-10-15 DIAGNOSIS — J9621 Acute and chronic respiratory failure with hypoxia: Secondary | ICD-10-CM

## 2024-10-15 LAB — APTT: aPTT: 39 s — ABNORMAL HIGH (ref 24–36)

## 2024-10-15 LAB — BASIC METABOLIC PANEL WITH GFR
Anion gap: 11 (ref 5–15)
BUN: 23 mg/dL (ref 8–23)
CO2: 28 mmol/L (ref 22–32)
Calcium: 8.6 mg/dL — ABNORMAL LOW (ref 8.9–10.3)
Chloride: 98 mmol/L (ref 98–111)
Creatinine, Ser: 1.3 mg/dL — ABNORMAL HIGH (ref 0.61–1.24)
GFR, Estimated: 57 mL/min — ABNORMAL LOW (ref >60)
Glucose, Bld: 169 mg/dL — ABNORMAL HIGH (ref 70–99)
Potassium: 3.9 mmol/L (ref 3.5–5.1)
Sodium: 137 mmol/L (ref 135–145)

## 2024-10-15 LAB — GLUCOSE, CAPILLARY
Glucose-Capillary: 172 mg/dL — ABNORMAL HIGH (ref 70–99)
Glucose-Capillary: 172 mg/dL — ABNORMAL HIGH (ref 70–99)
Glucose-Capillary: 194 mg/dL — ABNORMAL HIGH (ref 70–99)

## 2024-10-15 LAB — MAGNESIUM: Magnesium: 1.9 mg/dL (ref 1.7–2.4)

## 2024-10-15 MED ORDER — METOPROLOL TARTRATE 50 MG PO TABS
50.0000 mg | ORAL_TABLET | Freq: Two times a day (BID) | ORAL | 2 refills | Status: DC
  Filled 2024-10-15: qty 60, 30d supply, fill #0

## 2024-10-15 MED ORDER — SPIRONOLACTONE 25 MG PO TABS
25.0000 mg | ORAL_TABLET | Freq: Every day | ORAL | 2 refills | Status: AC
  Filled 2024-10-15: qty 30, 30d supply, fill #0

## 2024-10-15 MED ORDER — FUROSEMIDE 40 MG PO TABS
40.0000 mg | ORAL_TABLET | Freq: Two times a day (BID) | ORAL | 2 refills | Status: DC
  Filled 2024-10-15: qty 60, 30d supply, fill #0

## 2024-10-15 MED ORDER — METOPROLOL SUCCINATE ER 50 MG PO TB24
50.0000 mg | ORAL_TABLET | Freq: Every day | ORAL | 11 refills | Status: AC
  Filled 2024-10-15: qty 30, 30d supply, fill #0

## 2024-10-15 MED ORDER — FUROSEMIDE 10 MG/ML IJ SOLN
40.0000 mg | Freq: Every day | INTRAMUSCULAR | Status: DC
  Administered 2024-10-15: 40 mg via INTRAVENOUS
  Filled 2024-10-15: qty 4

## 2024-10-15 MED ORDER — NITROGLYCERIN 0.4 MG SL SUBL
0.4000 mg | SUBLINGUAL_TABLET | SUBLINGUAL | 2 refills | Status: AC | PRN
  Filled 2024-10-15: qty 25, 6d supply, fill #0

## 2024-10-15 MED ORDER — BENZONATATE 100 MG PO CAPS
200.0000 mg | ORAL_CAPSULE | Freq: Three times a day (TID) | ORAL | Status: DC | PRN
Start: 2024-10-15 — End: 2024-10-15
  Administered 2024-10-15: 200 mg via ORAL
  Filled 2024-10-15: qty 2

## 2024-10-15 MED ORDER — FUROSEMIDE 40 MG PO TABS
80.0000 mg | ORAL_TABLET | Freq: Every day | ORAL | 2 refills | Status: AC
  Filled 2024-10-15: qty 60, 30d supply, fill #0

## 2024-10-15 NOTE — Progress Notes (Signed)
 Progress Note  Patient Name: Jeremiah [name] Date of Encounter: 10/15/2024 [facility] Cardiologist: Dr. Mona  Chief complaint: Shortness of breath and A-fib with RVR Reason of consult: Elevated troponin/heart failure  Interval Summary   No chest pain or heart failure symptoms overnight. Euvolemic. Ventricular rates have improved.  Vital Signs Vitals:   10/14/24 1923 10/14/24 2351 10/15/24 0418 10/15/24 0834  BP: 102/75 98/61 117/68 123/69  Pulse: 71 71 74 98  Resp: 20 18 20 20   Temp: 98.6 F (37 C) 98.2 F (36.8 C) 98.3 F (36.8 C) 97.9 F (36.6 C)  TempSrc: Oral Oral Oral Oral  SpO2: (!) 88% 96% 96% 90%  Weight:   101.1 kg   Height:        Intake/Output Summary (Last 24 hours) at 10/15/2024 1039 Last data filed at 10/15/2024 1008 Gross per 24 hour  Intake 480 ml  Output 600 ml  Net -120 ml      10/15/2024    4:18 AM 10/14/2024    1:27 PM 10/02/2024    2:18 PM  Last 3 Weights  Weight (lbs) 222 lb 14.2 oz 220 lb 0.3 oz 221 lb 6.4 oz  Weight (kg) 101.1 kg 99.8 kg 100.426 kg     Telemetry/ECG  Sinus rhythm without ectopy- Personally Reviewed  Physical Exam  GEN: No acute distress.   Neck: No JVD Cardiac: RRR, no rubs, or gallops.  Crescendo decrescendo 3 out of 6 systolic ejection murmur at the second right costal space, 3 out of 6 diastolic murmur at the apex, Respiratory: Decreased breath sounds bilaterally, nasal cannula oxygen , no wheezes rales or rhonchi's GI: Abdominal obesity, soft, nontender, non-distended  MS: No edema, warm/dry skin, diminished bilateral lower extremity pulses  Assessment & Plan   NSTEMI Coronary artery disease  with history of CABG Currently denies anginal chest pain. When he presented to the ED patient was endorsing chest tightness likely precipitated by A-fib with RVR and underlying valvular heart disease. Last ischemic workup was a stress test back in 2021 which was reported to be low risk & heart cath  2017. Patient has ST depressions in the inferior leads noted on prior tracings as well - likely due to known occluded graft.  Slight elevation of cardiac biomarkers in the setting of A-fib with RVR and underlying valvular heart disease.  Given the fact that his ischemic workup was at least 4 years ago recommended left and right heart catheterization to reevaluate for obstructive disease and also to evaluate hemodynamics given his valvular heart disease.  However, patient would like to hold off on any ischemic workup at this time and discuss this further with his primary cardiologist Dr. Mona. We also discussed alternatives to left and right heart catheterization - which he wants to hold off as well until follow up with Dr. Mona.  IF he remains reluctant to angiography may consider cMRI stress protocol as outpatient - as it can evaluate for ischemia and hemodynamics of valvular heart disease.  Moved up his appt with Dr. Mona to later this month.  Both patient and wife agreeable with the plan of care Antianginal therapies: Metoprolol , sublingual nitroglycerin tablets  Reemphasized importance of improving his modifiable cardiovascular risk factors Continue atorvastatin  80 mg p.o. daily Continue Zetia  10 mg p.o. daily    Acute on chronic HFpEF Likely precipitated by ERAF after cardioversion and due to valvular heart disease.  Currently shortness of breath is improved, warm extremities, and oxygen  requirements have gone down. But required BiPAP  supplementation while in the ED. Currently on nasal cannula oxygen  Continue home medications. Transitioning IV Lasix  to oral  BNP 119 Net IO Since Admission: -1,620 mL [10/15/24 1220] Strict I's and O's and daily weights Echocardiogram from today notes preserved LVEF, diastolic dysfunction was not commented on due to severe MAC, but filling pressures suggestive of at least grade 2 diastolic dysfunction with elevated left atrial pressure E/e'  34 Transition back to Lasix  40 mg p.o. twice daily, home dose Continue spironolactone  25 mg p.o. daily Considering starting ARB or ARNI at the next office visit.  SBP ranges between 107-169mmHG   Persistent atrial fibrillation status post cardioversion 10/13/2024 Underwent cardioversion 10/13/24. When EMS arrived he was noted to be in A-fib with RVR. Converted back to sinus rhythm with Cardizem. Currently in sinus rhythm Rate control: Lopressor  transition to Toprol  xl  Rhythm control: NA Thromboembolic prophylaxis: Eliquis  Echocardiogram notes at least moderate mitral stenosis, will continue Eliquis  for now. But did discuss considering Coumadin as an alternative given his valvular heart disease and history of GI bleed making it easier for reversal if needed.  Patient will discuss with Dr. Mona Since he was just cardioverted will continue Eliquis  as it will avoid periods of subtherapeutic anticoagulation which reduce his risk of stroke s/p cardioversion.  Encouraged him to address his sleep apnea and weight loss.    Mitral valve disease Degenerative/calcified mitral valve with severe MAC Nonrheumatic Echo from today notes a mean gradient of 11 mmHg, mitral valve VTI 66.6 cm, heart rate of 79 bpm, stroke-volume 77 mitral valve area per continuity equation 1.15 cm.  Suggestive of moderate to severe MS. Right ventricular size visually appears normal and function mildly reduced (TAPSE 12 mm, RVS prime 7.3 cm/s) Has been evaluated by structural heart team in the past - reconsider follow up as outpatient.  May benefit from reevaluating his hemodynamics with either right heart catheterization or cardiac MRI. yesterday since this morning   Peripheral vascular disease with claudication No active rest pain, claudication with prolonged ambulation (chronic and stable) Sees Dr. Court  Currently on cilostazol  50 mg p.o. twice daily, will hold for now given HFpEF and recurrent Afib (per wife /  husband) Already on anticoagulation given his persistent A-fib Reemphasized importance of monitoring for bleeding given his history of GI bleed   History of GI bleed: Does not endorse evidence of bleeding.   Sleep apnea not on CPAP: Reemphasized the importance of having this addressed.   For questions or updates, please contact [facility] Please consult [email or web address] for contact info under   Signed, Madonna Large, DO, [facility]  A Division of [facility] [address] [place], [place] [address]

## 2024-10-15 NOTE — TOC Transition Note (Signed)
 Transition of Care [facility]) - Discharge Note   Patient Details  Name: Jeremiah [name] MRN: [id number] Date of Birth: 12-15-1945  [facility] CM/SW Contact:  Waddell Barnie Rama, RN Phone Number: 10/15/2024, 1:26 PM   Clinical Narrative:    For dc today, has no needs.         Patient Goals and CMS Choice            Discharge Placement                       Discharge Plan and Services Additional resources added to the After Visit Summary for                                       Social Drivers of Health (SDOH) Interventions SDOH Screenings   Food Insecurity: No Food Insecurity (10/14/2024)  Housing: Low Risk  (10/14/2024)  Transportation Needs: No Transportation Needs (10/14/2024)  Utilities: Not At Risk (10/14/2024)  Depression (PHQ2-9): Low Risk  (12/17/2023)  Social Connections: Moderately Isolated (10/14/2024)  Tobacco Use: Medium Risk (10/14/2024)     Readmission Risk Interventions    10/15/2024    1:24 PM 10/10/2023    1:52 PM  Readmission Risk Prevention Plan  Transportation Screening Complete Complete  Home Care Screening Complete   Medication Review (RN CM) Complete   Medication Review (RN Care Manager)  Complete  PCP or Specialist appointment within 3-5 days of discharge  Complete  HRI or Home Care Consult  Complete  SW Recovery Care/Counseling Consult  Complete  Palliative Care Screening  Complete  Skilled Nursing Facility  Not Applicable

## 2024-10-15 NOTE — Progress Notes (Signed)
 Heart Failure Navigator Progress Note  Assessed for Heart & Vascular TOC clinic readiness.  Patient does not meet criteria due to has a scheduled [facility] appointment on 10/23/2024. No HF TOC. .   Navigator will sign off at this time.   Stephane Haddock, BSN, Scientist, Clinical (histocompatibility And Immunogenetics) Only

## 2024-10-15 NOTE — Discharge Summary (Incomplete)
 Physician Discharge Summary   Patient: Jeremiah [name] MRN: [id number] DOB: 12-28-1945  Admit date:     10/13/2024  Discharge date: {dischdate:26783}  Discharge Physician: Jeremiah [name]   PCP: Jeremiah Slater, MD   Recommendations at discharge:  {Tip this will not be part of the note when signed- Example include specific recommendations for outpatient follow-up, pending tests to follow-up on. (Optional):26781}  ***  Discharge Diagnoses: Principal Problem:   Acute on chronic heart failure with preserved ejection fraction (HFpEF) (HCC) Active Problems:   Type 2 diabetes mellitus with hyperglycemia, without long-term current use of insulin  (HCC)   COPD GOLD II with reversibility   Hypothyroidism, acquired   OSA (obstructive sleep apnea)   Coronary artery disease involving coronary bypass graft of native heart without angina pectoris   Persistent atrial fibrillation (HCC)   Elevated troponin   Atherosclerosis of native coronary artery of native heart without angina pectoris   H/O: GI bleed   PAD (peripheral artery disease)   Mitral valve disease   Hx of CABG   Non-ST elevation (NSTEMI) myocardial infarction [facility])  Resolved Problems:   * No resolved hospital problems. [facility] Course: No notes on file  Assessment and Plan: No notes have been filed under this hospital service. Service: Hospitalist     {Tip this will not be part of the note when signed Body mass index is 33.89 kg/m. , ,  (Optional):26781}  {(NOTE) Pain control PDMP Statment (Optional):26782} Consultants: *** Procedures performed: ***  Disposition: {Plan; Disposition:26390} Diet recommendation:  Discharge Diet Orders (From admission, onward)     Start     Ordered   10/15/24 0000  Diet - low sodium heart healthy        10/15/24 1251   10/15/24 0000  Diet Carb Modified        10/15/24 1251           {Diet_Plan:26776} DISCHARGE MEDICATION: Allergies as of 10/15/2024       Reactions    Shellfish Allergy Shortness Of Breath, Nausea And Vomiting, Rash   Scallops   Fluzone [influenza Virus Vaccine] Nausea And Vomiting, Palpitations, Rash   Keflex  [cephalexin ] Rash   Itching and rash on face.         Medication List     STOP taking these medications    cilostazol  50 MG tablet Commonly known as: PLETAL        TAKE these medications    apixaban  5 MG Tabs tablet Commonly known as: ELIQUIS  Take 1 tablet (5 mg total) by mouth 2 (two) times daily.   atorvastatin  80 MG tablet Commonly known as: LIPITOR  Take 80 mg by mouth daily.   benzonatate  200 MG capsule Commonly known as: TESSALON  TAKE 1 CAPSULE(200 MG) BY MOUTH THREE TIMES DAILY AS NEEDED   budesonide  0.5 MG/2ML nebulizer solution Commonly known as: Pulmicort  Take 2 mLs (0.5 mg total) by nebulization 2 (two) times daily.   cetirizine  10 MG tablet Commonly known as: ZYRTEC  Take 1 tablet (10 mg total) by mouth daily.   colchicine  0.6 MG tablet Take 0.6 mg by mouth daily as needed (Gout).   dicyclomine  10 MG capsule Commonly known as: BENTYL  Take 10 mg by mouth 3 (three) times daily as needed for spasms.   ezetimibe  10 MG tablet Commonly known as: ZETIA  Take 1 tablet (10 mg total) by mouth daily.   fluticasone  50 MCG/ACT nasal spray Commonly known as: FLONASE  Place 1 spray into both nostrils daily.   formoterol  20 MCG/2ML nebulizer  solution Commonly known as: Perforomist  Take 2 mLs (20 mcg total) by nebulization 2 (two) times daily.   furosemide  40 MG tablet Commonly known as: LASIX  Take 2 tablets (80 mg total) by mouth daily.   glimepiride 2 MG tablet Commonly known as: AMARYL Take 4 mg by mouth every morning.   levothyroxine  88 MCG tablet Commonly known as: SYNTHROID  Take 88 mcg by mouth daily before breakfast. Take on an empty stomach   metFORMIN 500 MG 24 hr tablet Commonly known as: GLUCOPHAGE-XR Take 1,000 mg by mouth daily.   metoprolol  succinate 50 MG 24 hr tablet Commonly  known as: Toprol  XL Take 1 tablet (50 mg total) by mouth daily. Take with or immediately following a meal.   nitroGLYCERIN 0.4 MG SL tablet Commonly known as: NITROSTAT Place 1 tablet (0.4 mg total) under the tongue every 5 (five) minutes as needed for chest pain.   omeprazole 40 MG capsule Commonly known as: PRILOSEC Take 40 mg by mouth daily.   ondansetron  4 MG disintegrating tablet Commonly known as: ZOFRAN -ODT Take 4 mg by mouth every 8 (eight) hours as needed for vomiting or nausea.   Potassium Chloride  ER 20 MEQ Tbcr Take 20 mEq by mouth daily.   revefenacin  175 MCG/3ML nebulizer solution Commonly known as: YUPELRI  Take 3 mLs (175 mcg total) by nebulization daily.   spironolactone  25 MG tablet Commonly known as: ALDACTONE  Take 1 tablet (25 mg total) by mouth daily.        Discharge Exam: Filed Weights   10/14/24 1327 10/15/24 0418  Weight: 99.8 kg 101.1 kg   ***  Condition at discharge: {DC Condition:26389}  The results of significant diagnostics from this hospitalization (including imaging, microbiology, ancillary and laboratory) are listed below for reference.   Imaging Studies: ECHOCARDIOGRAM COMPLETE Result Date: 10/14/2024    ECHOCARDIOGRAM REPORT   Patient Name:   Jeremiah [name] Date of Exam: 10/14/2024 Medical Rec #:  [id number]   Height:       68.0 in Accession #:    [id number]  Weight:       221.4 lb Date of Birth:  Apr 19, 1946    BSA:          2.134 m Patient Age:    78 years    BP:           110/73 mmHg Patient Gender: M           HR:           91 bpm. Exam Location:  Inpatient Procedure: 2D Echo, Cardiac Doppler, Color Doppler and Intracardiac            Opacification Agent (Both Spectral and Color Flow Doppler were            utilized during procedure). Indications:    CHF- Acute Diastolic I50.31                 Elevated troponin  History:        Patient has prior history of Echocardiogram examinations, most                 recent 01/31/2024. HFpEF, CAD, Prior  CABG and Pacemaker, Mitral                 Valve Disease; Risk Factors:Diabetes, Dyslipidemia and                 Hypertension.  Sonographer:    Koleen Popper RDCS Referring Phys: EVALENE RAMAN OPYD  Sonographer Comments: Patient  is obese. Image acquisition challenging due to patient body habitus. IMPRESSIONS  1. Left ventricular ejection fraction, by estimation, is 55 to 60%. The left ventricle has normal function. The left ventricle has no regional wall motion abnormalities. Left ventricular diastolic function could not be evaluated.  2. Right ventricular systolic function is mildly reduced. The right ventricular size is normal. There is normal pulmonary artery systolic pressure.  3. Left atrial size was moderately dilated.  4. The mitral valve is degenerative. Mild mitral valve regurgitation. Mild mitral stenosis. The mean mitral valve gradient is 11.0 mmHg. Severe mitral annular calcification.  5. The aortic valve is tricuspid. There is moderate calcification of the aortic valve. Aortic valve regurgitation is not visualized. Mild aortic valve stenosis. Aortic valve area, by VTI measures 2.01 cm. Aortic valve mean gradient measures 8.0 mmHg. Aortic valve Vmax measures 2.00 m/s.  6. The inferior vena cava is normal in size with greater than 50% respiratory variability, suggesting right atrial pressure of 3 mmHg. FINDINGS  Left Ventricle: Left ventricular ejection fraction, by estimation, is 55 to 60%. The left ventricle has normal function. The left ventricle has no regional wall motion abnormalities. Definity  contrast agent was given IV to delineate the left ventricular  endocardial borders. The left ventricular internal cavity size was normal in size. There is no left ventricular hypertrophy. Left ventricular diastolic function could not be evaluated due to mitral annular calcification (moderate or greater). Left ventricular diastolic function could not be evaluated. Right Ventricle: The right ventricular size is  normal. No increase in right ventricular wall thickness. Right ventricular systolic function is mildly reduced. There is normal pulmonary artery systolic pressure. The tricuspid regurgitant velocity is 2.75 m/s, and with an assumed right atrial pressure of 3 mmHg, the estimated right ventricular systolic pressure is 33.2 mmHg. Left Atrium: Left atrial size was moderately dilated. Right Atrium: Right atrial size was normal in size. Pericardium: There is no evidence of pericardial effusion. Mitral Valve: Elevated gradient but near normal valve area by PHT (1.89cm2) so suspect due more to heart rate and severe MAC. The mitral valve is degenerative in appearance. Severe mitral annular calcification. Mild mitral valve regurgitation. Mild mitral valve stenosis. MV peak gradient, 17.1 mmHg. The mean mitral valve gradient is 11.0 mmHg. Tricuspid Valve: The tricuspid valve is normal in structure. Tricuspid valve regurgitation is mild . No evidence of tricuspid stenosis. Aortic Valve: The aortic valve is tricuspid. There is moderate calcification of the aortic valve. Aortic valve regurgitation is not visualized. Mild aortic stenosis is present. Aortic valve mean gradient measures 8.0 mmHg. Aortic valve peak gradient measures 16.0 mmHg. Aortic valve area, by VTI measures 2.01 cm. Pulmonic Valve: The pulmonic valve was normal in structure. Pulmonic valve regurgitation is not visualized. No evidence of pulmonic stenosis. Aorta: The aortic root is normal in size and structure. Venous: The inferior vena cava is normal in size with greater than 50% respiratory variability, suggesting right atrial pressure of 3 mmHg. IAS/Shunts: No atrial level shunt detected by color flow Doppler.  LEFT VENTRICLE PLAX 2D LVIDd:         4.10 cm   Diastology LVIDs:         3.00 cm   LV e' medial:    4.90 cm/s LV PW:         1.20 cm   LV E/e' medial:  41.5 LV IVS:        1.30 cm   LV e' lateral:   7.97 cm/s LVOT diam:  2.20 cm   LV E/e' lateral:  25.5 LV SV:         77 LV SV Index:   36 LVOT Area:     3.80 cm  RIGHT VENTRICLE            IVC RV S prime:     7.28 cm/s  IVC diam: 2.20 cm TAPSE (M-mode): 1.2 cm LEFT ATRIUM             Index LA diam:        6.40 cm 3.00 cm/m LA Vol (A2C):   57.8 ml 27.09 ml/m LA Vol (A4C):   92.2 ml 43.21 ml/m LA Biplane Vol: 75.0 ml 35.15 ml/m  AORTIC VALVE AV Area (Vmax):    2.19 cm AV Area (Vmean):   2.14 cm AV Area (VTI):     2.01 cm AV Vmax:           200.00 cm/s AV Vmean:          132.000 cm/s AV VTI:            0.383 m AV Peak Grad:      16.0 mmHg AV Mean Grad:      8.0 mmHg LVOT Vmax:         115.00 cm/s LVOT Vmean:        74.400 cm/s LVOT VTI:          0.203 m LVOT/AV VTI ratio: 0.53  AORTA Ao Root diam: 3.20 cm Ao Asc diam:  3.80 cm MITRAL VALVE                TRICUSPID VALVE MV Area (PHT): 2.58 cm     TR Peak grad:   30.2 mmHg MV Area VTI:   1.15 cm     TR Vmax:        275.00 cm/s MV Peak grad:  17.1 mmHg MV Mean grad:  11.0 mmHg    SHUNTS MV Vmax:       2.07 m/s     Systemic VTI:  0.20 m MV Vmean:      158.0 cm/s   Systemic Diam: 2.20 cm MV Decel Time: 294 msec MR Peak grad: 109.8 mmHg MR Vmax:      524.00 cm/s MV E velocity: 203.50 cm/s MV A velocity: 126.50 cm/s MV E/A ratio:  1.61 Morene Brownie Electronically signed by Morene Brownie Signature Date/Time: 10/14/2024/11:44:19 AM    Final    DG Chest Portable 1 View Result Date: 10/13/2024 EXAM: PA AND LATERAL (2) VIEW(S) XRAY OF THE CHEST 10/13/2024 11:15:00 PM COMPARISON: PA and lateral chest 12/11/2023. CLINICAL HISTORY: cp cp FINDINGS: LINES, TUBES AND DEVICES: A left chest Dual lead pacing system with stable wire insertions. LUNGS AND PLEURA: There is an interval increased perihilar vascular congestion with increased interstitial edema in the mid to lower lung fields and formation of small pleural effusions. There is haziness in the lung bases, which could be atelectasis or ground-glass edema, and less likely pneumonitis. No other focal airspace  opacities are seen. The lungs are emphysematous. Findings consistent with CHF or fluid overload. No pneumothorax. HEART AND MEDIASTINUM: The heart is enlarged. Sternotomy and CABG changes are again noted. There is a stable mediastinum with aortic tortuosity and calcification. BONES AND SOFT TISSUES: No acute osseous abnormality. IMPRESSION: 1. Interval increased perihilar vascular congestion, interstitial edema, and small pleural effusions, consistent with CHF or fluid overload. 2. Cardiomegaly. 3. Aortic atherosclerosis. Electronically signed by: Francis Quam MD 10/13/2024 11:23 PM  EST RP Workstation: [id number]   EP STUDY Result Date: 10/13/2024 See surgical note for result.  VAS US  ABI WITH/WO TBI Result Date: 09/16/2024  LOWER EXTREMITY DOPPLER STUDY Patient Name:  Rose Hippler  Date of Exam:   09/16/2024 Medical Rec #: [id number]    Accession #:    [id number] Date of Birth: 08/10/1946     Patient Gender: M Patient Age:   41 years Exam Location:  [address] Procedure:      VAS US  ABI WITH/WO TBI Referring Phys: KENNETH HILTY --------------------------------------------------------------------------------  Indications: Claudication, and peripheral artery disease.              Per chart: He says he can walk a decent distance and was able to              walk down the hallway here without stopping but noted when he walks              a longer distance such as from the entrance of [employer] to the back              of the building he has to stop because of pain in his legs High Risk Factors: Hypertension, hyperlipidemia, Diabetes, past history of                    smoking, prior MI, coronary artery disease.  Comparison       Previous ABIs 09/09/23 were 0.86 on the right and 0.72 on the Study:           left Performing Technologist: Edsel Mustard RVT  Examination Guidelines: A complete evaluation includes at minimum, Doppler waveform signals and systolic blood pressure reading at the level of bilateral  brachial, anterior tibial, and posterior tibial arteries, when vessel segments are accessible. Bilateral testing is considered an integral part of a complete examination. Photoelectric Plethysmograph (PPG) waveforms and toe systolic pressure readings are included as required and additional duplex testing as needed. Limited examinations for reoccurring indications may be performed as noted.  ABI Findings: +---------+------------------+-----+----------+--------+ Right    Rt Pressure (mmHg)IndexWaveform  Comment  +---------+------------------+-----+----------+--------+ Brachial 150                                       +---------+------------------+-----+----------+--------+ PTA      120               0.75 monophasic         +---------+------------------+-----+----------+--------+ DP       109               0.68 monophasic         +---------+------------------+-----+----------+--------+ Great Toe68                0.42 Abnormal           +---------+------------------+-----+----------+--------+ +---------+------------------+-----+----------+-------+ Left     Lt Pressure (mmHg)IndexWaveform  Comment +---------+------------------+-----+----------+-------+ Brachial 160                                      +---------+------------------+-----+----------+-------+ PTA      100               0.62 monophasic        +---------+------------------+-----+----------+-------+ DP       99  0.62 monophasic        +---------+------------------+-----+----------+-------+ Great Toe68                0.42 Abnormal          +---------+------------------+-----+----------+-------+ +-------+-----------+-----------+------------+------------+ ABI/TBIToday's ABIToday's TBIPrevious ABIPrevious TBI +-------+-----------+-----------+------------+------------+ Right  0.75       0.42       0.86        0.47          +-------+-----------+-----------+------------+------------+ Left   0.62       0.43       0.72        0.63         +-------+-----------+-----------+------------+------------+ Bilateral ABIs appear decreased compared to prior study on 09/09/23. Bilateral TBIs appear essentially unchanged compared to prior study on 09/09/23.  Summary: Right: Resting right ankle-brachial index indicates moderate right lower extremity arterial disease. The right toe-brachial index is abnormal.  Left: Resting left ankle-brachial index indicates moderate left lower extremity arterial disease. The left toe-brachial index is abnormal.  *See table(s) above for measurements and observations. See arterial duplex report.  Electronically signed by Evalene Lunger MD on 09/16/2024 at 9:33:48 PM.    Final    VAS US  LOWER EXTREMITY ARTERIAL DUPLEX Result Date: 09/16/2024 LOWER EXTREMITY ARTERIAL DUPLEX STUDY Patient Name:  Aramis Weil  Date of Exam:   09/16/2024 Medical Rec #: [id number]    Accession #:    [id number] Date of Birth: 10/08/1946     Patient Gender: M Patient Age:   26 years Exam Location:  [address] Procedure:      VAS US  LOWER EXTREMITY ARTERIAL DUPLEX Referring Phys: VINIE HILTY --------------------------------------------------------------------------------  Indications: Claudication, peripheral artery disease. Throbbing pain in both              calves when walking.              Per chart: He says he can walk a decent distance and was able to              walk down the hallway here without stopping but noted when he walks              a longer distance such as from the entrance of [employer] to the back              of the building, he has to stop becuase of pain in his legs. High Risk Factors: Hypertension, hyperlipidemia, Diabetes, past history of                    smoking, prior MI, coronary artery disease.  Current ABI: Right- 0.75/0.42              Left- 0.62/0.43 Comparison Study: Bilateral LEA duplex 09/09/23 showed  right SFA 50-74 vs. 75-99%                   stenosis and left distal SFA/AK pop 75-99% stenosis vs.                   occlusion (heavily calcified plaque) Performing Technologist: Edsel Mustard RVT  Examination Guidelines: A complete evaluation includes B-mode imaging, spectral Doppler, color Doppler, and power Doppler as needed of all accessible portions of each vessel. Bilateral testing is considered an integral part of a complete examination. Limited examinations for reoccurring indications may be performed as noted.  +-----------+--------+-----+---------------+----------+-------------------+ RIGHT      PSV cm/sRatioStenosis       Waveform  Comments            +-----------+--------+-----+---------------+----------+-------------------+ CFA Prox   151                         biphasic                      +-----------+--------+-----+---------------+----------+-------------------+ CFA Distal 112                         biphasic                      +-----------+--------+-----+---------------+----------+-------------------+ DFA        161                         biphasic                      +-----------+--------+-----+---------------+----------+-------------------+ SFA Prox   79                          biphasic                      +-----------+--------+-----+---------------+----------+-------------------+ SFA Mid    572     4.7  75-99% stenosisstenotic  calcific plaque     +-----------+--------+-----+---------------+----------+-------------------+ SFA Distal 52                          monophasic                    +-----------+--------+-----+---------------+----------+-------------------+ POP Prox   42                          monophasic                    +-----------+--------+-----+---------------+----------+-------------------+ POP Distal 34                          monophasic                     +-----------+--------+-----+---------------+----------+-------------------+ TP Trunk   43                          monophasic                    +-----------+--------+-----+---------------+----------+-------------------+ ATA Distal 33                          monophasic                    +-----------+--------+-----+---------------+----------+-------------------+ PTA Distal 34                          monophasic                    +-----------+--------+-----+---------------+----------+-------------------+ PERO Distal0                                     suspected occlusion +-----------+--------+-----+---------------+----------+-------------------+ A focal velocity elevation of 572 cm/s was obtained at mid SFA with post  stenotic turbulence with a VR of 4.7. Findings are characteristic of 75-99% stenosis.  +-----------+--------+-----+--------+----------+-------------------------------+ LEFT       PSV cm/sRatioStenosisWaveform  Comments                        +-----------+--------+-----+--------+----------+-------------------------------+ CFA Prox   109                  triphasic                                 +-----------+--------+-----+--------+----------+-------------------------------+ DFA        155                  monophasic                                +-----------+--------+-----+--------+----------+-------------------------------+ SFA Prox   59                   biphasic                                  +-----------+--------+-----+--------+----------+-------------------------------+ SFA Mid    59                   biphasic                                  +-----------+--------+-----+--------+----------+-------------------------------+ SFA Distal 0            occluded          heavily calcified, unable to                                              insonate flow                    +-----------+--------+-----+--------+----------+-------------------------------+ POP Prox   30                   monophasic                                +-----------+--------+-----+--------+----------+-------------------------------+ POP Distal 21                   monophasic                                +-----------+--------+-----+--------+----------+-------------------------------+ TP Trunk   31                   monophasic                                +-----------+--------+-----+--------+----------+-------------------------------+ ATA Distal 31                   monophasic                                +-----------+--------+-----+--------+----------+-------------------------------+ PTA Distal 34  monophasic                                +-----------+--------+-----+--------+----------+-------------------------------+ PERO Distal17                   monophasic                                +-----------+--------+-----+--------+----------+-------------------------------+ Suspected occlusion of the distal SFA with reconstituted flow at the above-knee popliteal artery.  Summary: Right: 75-99% stenosis noted in the superficial femoral artery. Calcified plaque throughout the right extremity arteries. Left: Total occlusion noted in the distal superficial femoral artery/AK popliteal. Reconstituted flow noted in the popliteal artery. Heavily calcified plaque throughout.  See table(s) above for measurements and observations. See ABI report. Suggest Peripheral Vascular Consult. Electronically signed by Evalene Lunger MD on 09/16/2024 at 9:28:40 PM.    Final     Microbiology: Results for orders placed or performed during the hospital encounter of 10/13/24  MRSA Next Gen by PCR, Nasal     Status: None   Collection Time: 10/14/24  1:36 PM   Specimen: Nasal Mucosa; Nasal Swab  Result Value Ref Range Status   MRSA by PCR Next Gen NOT DETECTED NOT DETECTED  Final    Comment: (NOTE) The GeneXpert MRSA Assay (FDA approved for NASAL specimens only), is one component of a comprehensive MRSA colonization surveillance program. It is not intended to diagnose MRSA infection nor to guide or monitor treatment for MRSA infections. Test performance is not FDA approved in patients less than 81 years old. Performed at [facility], [address] [place], [place] [address]     Labs: CBC: Recent Labs  Lab 10/13/24 2308 10/14/24 0425  WBC 9.6 5.6  NEUTROABS 7.2  --   HGB 11.8* 11.3*  HCT 36.5* 35.4*  MCV 99.2 99.7  PLT 186 188   Basic Metabolic Panel: Recent Labs  Lab 10/13/24 2308 10/14/24 0425 10/15/24 0256  NA 141 140 137  K 3.8 4.4 3.9  CL 101 99 98  CO2 25 26 28   GLUCOSE 184* 127* 169*  BUN 16 17 23   CREATININE 0.98 1.10 1.30*  CALCIUM  8.5* 8.5* 8.6*  MG 1.8 3.1* 1.9   Liver Function Tests: No results for input(s): AST, ALT, ALKPHOS, BILITOT, PROT, ALBUMIN in the last 168 hours. CBG: Recent Labs  Lab 10/14/24 2044 10/14/24 2350 10/15/24 0416 10/15/24 0748 10/15/24 1104  GLUCAP 175* 151* 172* 172* 194*    Discharge time spent: {LESS THAN/GREATER THAN:[id number]} 30 minutes.  Signed: Concepcion Riser, MD [facility] 10/15/2024

## 2024-10-15 NOTE — TOC CM/SW Note (Signed)
 Transition of Care [facility]) - Inpatient Brief Assessment   Patient Details  Name: Jeremiah [name] MRN: [id number] Date of Birth: 08/27/1946  [facility]) CM/SW Contact:    Waddell Barnie Rama, RN Phone Number: 10/15/2024, 1:25 PM   Clinical Narrative: From home with spouse, has PCP and insurance on file, states has no HH services in place at this time or DME at home.  States family member will transport them home at [employer] and family is support system, .  Pta self ambulatory.   There are no ICM  needs identified  at this time.  Please place consult for ICM needs.     Transition of Care Asessment: Insurance and Status: Insurance coverage has been reviewed Patient has primary care physician: Yes Home environment has been reviewed: home with wife Prior level of function:: indep Prior/Current Home Services: Current home services (home oxygen  as needed with [employer]) Social Drivers of Health Review: SDOH reviewed no interventions necessary Readmission risk has been reviewed: Yes Transition of care needs: no transition of care needs at this time

## 2024-10-20 ENCOUNTER — Ambulatory Visit (HOSPITAL_COMMUNITY): Admitting: Internal Medicine

## 2024-10-21 ENCOUNTER — Other Ambulatory Visit (HOSPITAL_COMMUNITY): Payer: Self-pay

## 2024-10-21 DIAGNOSIS — M25511 Pain in right shoulder: Secondary | ICD-10-CM

## 2024-10-23 ENCOUNTER — Encounter: Payer: Self-pay | Admitting: Internal Medicine

## 2024-10-23 ENCOUNTER — Ambulatory Visit: Attending: Internal Medicine | Admitting: Internal Medicine

## 2024-10-23 VITALS — BP 110/69 | HR 60 | Ht 68.0 in | Wt 217.0 lb

## 2024-10-23 DIAGNOSIS — Z951 Presence of aortocoronary bypass graft: Secondary | ICD-10-CM | POA: Diagnosis not present

## 2024-10-23 DIAGNOSIS — I342 Nonrheumatic mitral (valve) stenosis: Secondary | ICD-10-CM

## 2024-10-23 DIAGNOSIS — I5033 Acute on chronic diastolic (congestive) heart failure: Secondary | ICD-10-CM

## 2024-10-23 DIAGNOSIS — Z950 Presence of cardiac pacemaker: Secondary | ICD-10-CM

## 2024-10-23 DIAGNOSIS — I480 Paroxysmal atrial fibrillation: Secondary | ICD-10-CM | POA: Diagnosis not present

## 2024-10-23 NOTE — Progress Notes (Signed)
 OFFICE NOTE  Chief Complaint:  Follow-up hospitalization  Primary Care Physician: Regino Slater, MD  HPI:  Jeremiah [name] is a 78 y.o. 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 He is originally from [place]  and his wife who is accompanying him today is originally from [place] . Jeremiah [name] had an MI in 2006 and underwent coronary artery bypass grafting with a LIMA to LAD, SVG to OM1, and SVG to RPDA. He did fairly well with this however 2015 was having chest discomfort. He underwent a nuclear stress test which showed possible reversible ischemia. Therefore he was referred for cardiac catheterization at the [facility] of [place] . That demonstrated all 3 patent bypass grafts with multivessel coronary disease. At that time he was also having episodes of palpitations and a monitor demonstrated PSVT. He was scheduled for possible ablation but is had chronic problems with an ankle fracture and nonhealing wound. He therefore never underwent ablation. He was placed on digoxin  and has been on metoprolol  but stopped the digoxin  at some point in the past. He reports over the past 8-12 months she's had no further palpitations. He also has a history of dyslipidemia, hypothyroidism, and borderline diabetes. Recently he had a sleep study through [facility] physicians which demonstrated severe obstructive sleep apnea and BiPAP was recommended with settings of 19/14 cm water  pressure. Currently he denies any chest pain or worsening shortness of breath. His last echocardiogram from his cardiologist in [place]  was a 2015 which showed an EF of 58% and no significant valvular disease.  07/26/2016  Jeremiah [name] returns today for follow-up. He was recently seen in the emergency department in the beginning of June for chest pain. This was associated with tachycardia and probable SVT. Troponin was noted to be elevated to 0.12. He had signs and symptoms concerning for unstable angina. He  was evaluated by my partners and felt to need cardiac catheterization. Eventually he underwent cardiac catheterization by Dr. Claudene with the results as follows:  Conclusion   LM lesion, 85% stenosed. Ost 1st Mrg lesion, 100% stenosed. SVG was injected . Ost RCA to Prox RCA lesion, 100% stenosed. SVG . Origin to Prox Graft lesion, 40% stenosed. Mid RCA to Dist RCA lesion, 100% stenosed. Prox LAD lesion, 100% stenosed. LIMA .   Patent saphenous vein grafts. SVG to PDA contains eccentric 40% proximal stenosis. SVG to the circumflex is widely patent. LIMA to the LAD is widely patent. Native distal left main contains 75% stenosis. The dominant obtuse marginal branch is totally occluded. The native right coronary is totally occluded. Left ventriculogram demonstrates inferior wall hypokinesis. EF 50%. Recent chest pain in the setting of PSVT with minimal enzyme elevation likely related to demand ischemia in the setting of underlying native coronary disease.   Recommendations: Medical therapy. Management of PSVT with medications versus ablation.   Since discharge she denies any further episodes of tachycardia palpitations. He is already on a high dose of beta blocker. He's had a small amount of weight gain but has stopped smoking.  He plans to start to do more exercise and work on weight loss. I'm concerned about his recurrent palpitations and the fact that asymptomatic and has demand ischemia related to them and bypass graft insufficiency.  06/10/2017  Jeremiah [name] returns today for follow-up. It's been almost a year since I last saw him. He was found to have recurrent symptomatic SVT and underwent comprehensive EP study and was diagnosed with classic AV nodal reentry  tachycardia and subsequently underwent selective radiofrequency ablation by Dr. Inocencio. He says since that time he's had no further episodes of tachypalpitations. Since then he was hospitalized twice, after developing C. difficile  colitis and enteritis with GI bleed in January. Subsequently he developed acute congestive heart failure was found to have a mild reduction in LVEF to 45-50% by echo in March 2018. At that time he was on furosemide  40 mg daily. He had a follow-up with Hao Meng, PA-C in April who noted he was on Lasix  40 mg twice daily, however he reported that he was not taking that to me today. Fact he says he is now off of Lasix . He reports worsening shortness of breath and hasn't fact had a 13 pound weight gain since April. He denies any lower extremity edema. He has been working with Dr. Darlean for his shortness of breath.  07/22/2017  Jeremiah [name] is back today for follow-up. He restarted his Lasix  at my direction his weight is now down from 252-244. He's had significant improvement in swelling and reports some improvement in his breathing however he says it still not good. He does have a mildly reduced EF of 45-50% but also has some chronic lung disease which is likely contributing. He says he gets short of breath while doing certain activities in the yard and then comes inside and uses his oxygen  which improves him almost immediately. He denies any orthopnea. He has not had any recent or productive cough. He has a follow-up visit with his pulmonologist tomorrow. I had ordered lab work including a metabolic profile and BNP which was never obtained.  09/09/2018  Jeremiah [name] is seen today in follow-up.  He reports some improvement in his shortness of breath.  He has joined a gym and now is exercising every day.  Weight however has been stable at 252, actually up 2 pounds from April.  Despite this his breathing has improved somewhat.  He still uses oxygen .  He did have one episode recently where he became nauseated and vomited.  He was [place] [facility] [place] and had been taking antibiotics for a dental infection.  He did undergo CT scan of the head and neck and that demonstrated bilateral carotid artery calcification.  This  is not surprising given his history of coronary artery disease and prior CABG however he has not had prior carotid Dopplers.    02/18/2019  Jeremiah [name] is seen today in follow-up.  Recently he says has had some improvement in shortness of breath.  Dr. Darlean, his pulmonologist had started him on a new nebulizer called revefenacin .  His most recent hemoglobin A1c was 7.1.  Weight is down about 4 pounds.  He denies any new chest pain.  He has had some recent anemia and work-up is apparently underway for this.  He is tentatively scheduled for an endoscopy on April 16 with Dr. Saintclair.  He is concerned because of his pulmonary status that he may be at high risk of decompensation with the procedure.  He also has follow-up with his PCP in the near future.  09/14/2019  Jeremiah [name] returns today for follow-up.  He continues to do well.  His shortness of breath has improved which I think was both believed due to a combination of pulmonary optimization as well as improvement in LV function.  Recently his echo in fact showed LVEF has normalized to 60 to 65%.  He remains euvolemic and is actually 2 pounds lighter than he was seen  about 8 months ago.  Hemoglobin A1c has been reasonably well-controlled however his lipids are still uncontrolled.  His total cholesterol is 211, triglycerides 494, HDL 32 and LDL of 114.  His target LDL is less than 70 and his elevated triglycerides also put him at increased cardiovascular risk.  03/02/2020  Jeremiah [name] is seen today in follow-up.  He denies any chest pain but does have some shortness of breath.  Is been some weight gain.  He reports less physical activity has been struggling with a long recovery after undergoing circumcision for issues with phimosis.  Its unfortunate that he had a difficult recovery but seems to be doing better.  Blood pressure was well controlled today.  His EKG demonstrated some worsening inferior and lateral T wave inversions.  This was previously seen however  not as significant.  His last heart cath was in 2017 which did show a patent SVG to OM and occluded SVG to right coronary.  He is anticipating restarting an exercise program and working with a psychologist, educational.  02/24/2021  Jeremiah [name] is seen today in follow-up.  Overall he says he is feeling reasonably well.  He has had some shortness of breath.  He is scheduled for a sleep study.  We discussed this because of the previously mentioned issues and that is actually scheduled tonight.  He also has a history of heart failure with diabetes which is not at target A1c of 8.3.  Lipids recently at assess her total cholesterol 106, HDL 33, LDL 49 and triglycerides 134.  07/18/2021  Jeremiah [name] is seen today for routine follow-up.  He seems to be doing well on the Jardiance  at 25 mg daily.  Hemoglobin A1c is now come down to 7.3.  Blood pressure is well controlled.  EKG is unchanged showing sinus rhythm with some lateral T wave changes.  He has noted some lower blood pressures at home.  He says around the mid 90s over 33s.  Blood pressure here was elevated 130/90 however more typical readings are between 90 and 100 systolic.  He says he is asymptomatic for this.  He is on very little medicine.  1 option would be to consider backing off on the bisoprolol .  Cholesterol is very well controlled as of February total 106, HDL 33, LDL 49 triglycerides 134.  I do not believe it is too low.  Would recommend continue his current regimen.  02/02/2022  Jeremiah [name] is seen today in follow-up.  Overall he says he is doing very well.  Weight has been fairly stable.  Blood pressure is well controlled.  He has been undergoing some rehab for a frozen shoulder.  He denies any worsening shortness of breath.  Recent labs showed total cholesterol 153, HDL 33 triglycerides 291 and LDL 73.  A1c 7.3%.  EKG shows a sinus rhythm with some inferolateral T wave changes (unchanged from prior EKG in 2022).  09/07/2022  Jeremiah [name] is seen today in follow-up.   Overall he seems to be doing pretty well.  He is actually lost quite a bit of weight recently.  He is down to 215 pounds but was as high as 245 back in March of this past year.  He has made some dietary changes.  He is also had some issues with his GI system and is having an upcoming endoscopy.  Blood pressure is well controlled.  He denies any chest pain.  He did have a recent syncopal episode.  He said he was working on his  house and had been not sleeping well and thought he was dehydrated and simply passed out.  It does not sound like there was a clear prodrome prior to that but if he feels it was likely related to exhaustion.  He has never passed out before.  He has not had any subsequent episodes.  12/27/2022  Jeremiah [name] returns today for follow-up.  Recently he is followed up with Dr. Darlean.  Has been having issues with reflux and had EGD and colonoscopy.  He was noted to have I believe 24 lesions of the esophagus, likely Barrett's esophagus.  He has had vomiting and is scheduled for a gastric emptying study.  Despite this, he is also made significant dietary changes and reports in the past 3 months he is lost about 35 pounds.  It was noted that he is markedly hypotensive today with blood pressure 82/46.  He was also seen recently as mentioned with Dr. Darlean and had a CT scan of the chest.  Besides emphysema this demonstrated aortic valve calcification as well as pulmonary artery dilatation suggestive of pulmonary hypertension.  He has been short of breath but uses home oxygen .  He also tells me that he has an upcoming visit for periodontal surgery with Dr. Bari.  02/18/2023  Jeremiah [name] is seen today as an add-on visit.  Unfortunately he was just hospitalized over the past week with dehydration, nausea vomiting and diarrhea as well as abdominal pain.  He was seen in [facility] ER and stabilized and then presented to his PCP was sent to [facility].  There he was admitted and found to have norovirus gastroenteritis in addition  to what was described as an ischemic colitis.  His medications were held including his diuretics and blood pressure medicines to allow higher blood pressure.  I had recently cut back on his blood pressure medicine in January, stopping his losartan .  Since discharge she has had some persistent diarrhea.  He was noted to be somewhat anemic with a hemoglobin of 11.4.  His echo was read by myself and surprisingly indicated that he has moderate to severe mitral stenosis with elevated left atrial pressure.  This is likely contributing to some of his symptoms of shortness of breath.  Today he says he feels quite fatigued and is very symptomatic.  He is lost a significant amount of weight down another 15 pounds since I saw him in January.  In addition to the mitral stenosis, his EKG today shows atrial fibrillation.  This is a new diagnosis for him.  It does not appear that he had an EKG at [facility] that I could find but did have an EKG at [facility] which showed sinus rhythm with marked sinus arrhythmia although I would say it is questionably A-fib.  03/28/2023  Jeremiah [name] returns today for follow-up.  Unfortunately he has been hospitalized at least twice since I last saw him at [facility].  He has had issues with recurrent diarrhea and then unfortunately GI bleeding.  He was found to have mesenteric ischemia and underwent left brachial artery cutdown and mesenteric artery and celiac artery stenting.  Fortunately, he was noted to be in sinus rhythm when he presented although we had been working him up for possible TEE cardioversion.  Ultimately cardiology was consulted and they recommended a TEE to further evaluate his mitral valve disease.  Eliquis  was held but after stenting ultimately aspirin  and Eliquis  were restarted.  He is TEE demonstrated normal LVEF with heavily calcified mitral valve that was thought to be  degenerative with mild MR and a mean gradient of 5.8 at 65 bpm consistent with severe mitral stenosis with possibly low  gradient.  There was also diffuse calcification of the aortic valve which planimeter to 1.2 cm with a peak velocity of 3.2 m/s thought to be moderately stenotic.  He required 8 units of packed red blood cells and after discharge remained anemic with a hemoglobin of 7.8.  He remains on oxygen  and was recently started on iron .  We discussed today about possible management options.  I do feel that mitral stenosis is likely a major risk factor for worsening shortness of breath as well as development of recurrent atrial fibrillation and stroke.  They had discussed the possibility of a Watchman device with him however with his peripheral arterial disease and mitral stenosis I think this would be a suboptimal procedure.  Ideally 1 would consider mitral valve replacement surgery with left atrial appendage closure surgically, and possibly consider aortic valve intervention.  However his comorbidities, recent GI bleeding and mesenteric ischemia may make him a prohibitive surgical candidate.  Additionally he has had prior CABG and this would be a redo sternotomy.  06/26/2023  Jeremiah [name] returns today for follow-up.  Again he was hospitalized at this time at [facility] for severe anemia and acute on chronic heart failure.  I did see him as I was on service in the hospital.  We participated in a diuresis.  Workup revealed severe anemia.  He was given IV iron .  His blood thinners have been stopped.  Subsequently after discharge and substantial diuresis of 15 pounds, he was discharged on 80 mg Lasix  daily and Aldactone .  Since follow-up his weight has remained stable or is actually a few pounds lighter.  He recently saw Dr. Darlean with pulmonary who made an adjustment changing his metoprolol  over to bisoprolol .  He has noted no change in his symptoms with this.  He was also seen at [facility] for capsule endoscopy.  This suggested that there was some small bowel AVM however options to try to alleviate bleeding from this are limited.   They had suggested possibly a referral to [facility] to see about a balloon procedure.  Today he is in good spirits.  He reports his symptoms are pretty stable.  He has had some improvement in nasal allergy symptoms with Flonase  and is requesting a refill for that.  He is scheduled to see hematology tomorrow and my partners in October and November to address the Watchman device and possible mitral valve intervention.  11/11/2023  Jeremiah [name] is seen today in follow-up.  Unfortunately last few months have been quite difficult for him.  He had redeveloped GI bleeding and became quite anemic with hemoglobin down into the sixes.  He was again transfused.  He has been established with oncology and is undergoing therapies there to help improve his anemia.  He apparently was found to have bleeding from a small branch of the SMA based on capsule endoscopy.  This is not something that can be apparently easily treated however there is some ability to treat it but requires apparently specialized equipment that could be only at [place] or in [place].  Because of ongoing bleeding risk, he has not been a candidate for anticoagulation.  He has been having significant pain in both legs particularly when walking.  Dopplers in October 2024 shows severe stenosis of the right superficial femoral artery and severe stenosis of the above-the-knee popliteal artery on the left.  He is frustrated that  this continues however I discussed with Dr. Court today and percutaneous intervention for this is not possible to tolerate dual antiplatelet therapy for about 3 months.  Currently he is on aspirin  low-dose however would need to be on Plavix  as well.  Of note his blood pressure is quite low today at 80/49.  I wonder if this may be related to recent bleeding and or dehydration.  02/11/2024  Jeremiah [name] returns today for follow-up.  Overall he says he is feeling pretty well.  He had a procedure done at [facility] to locate an area of GI bleeding which was  cauterized.  Since then it seems that his hemoglobin has remained stable if not improved.  He is working with Dr. Gatha in the cancer center and receiving IV iron .  His most recent hemoglobin was 11.9 on March 3.  He still reports what sounds like claudication symptoms.  He saw Dr. Court recently and it was recommended to wait on additional 6 months to make sure that his hemoglobin was stable before considering a peripheral intervention.  He also had a repeat echo last month.  This was reviewed by Dr. Wendel kidding LVEF 55 to 60% with severe left atrial enlargement and moderate to severe mitral stenosis, possibly somewhat worse.  06/16/2024  Jeremiah [name] returns today for follow-up.  So far has been fairly stable since I last saw him in March.  His hemoglobin also has been stable ranging between 12.5 and 13.5.  His iron  stores have been normal.  He has had no recurrent bleeding.  He does have symptoms of what sound like claudication.  He says he can walk a decent distance and was able to walk down the hallway here without stopping but noted when he walks a longer distance such as from the entrance of [employer] to the back of the building he has to stop because of pain in his legs.  Breathing is also limiting it note he has noted to not always use the oxygen  regularly when he exerts himself.  Today his SpO2 was at 88% off of oxygen  but quickly climbed up to 97%.  He also has been struggling with some trigger finger recently and other issues.  He had a repeat echo this year which showed worsening mitral stenosis however he has not been considered a candidate for therapy for that.  10/23/2024  Jeremiah [name] returns today for hospital follow-up.  He was admitted from 10/13/2024 to 10/15/2024 for acute on chronic diastolic heart failure.  Prior to this he underwent elective cardioversion on 11/11 which successfully converted him to sinus rhythm but afterwards he developed acute worsening shortness of breath and was found to be  in congestive heart failure.  He was noted to have flat elevated troponins in the low 100s and EKG which showed some mild ST depressions.  There was concern for possible ischemia but he had no anginal symptoms.  He was diuresed and ultimately felt much better.  I suspect that he was developing acute on chronic diastolic heart failure that triggered his A-fib and then when he converted he then was more symptomatic.  Clearly the atrial fibrillation is contributing to his decompensation.  This has been difficult to manage.  Fortunately he has been able to tolerate anticoagulation for the past 8 months with some improvement in his anemia.  He also had recent shoulder injury and plans to undergo an MRI to evaluate that.  He was evaluated recently for PAD by Dr. Pearline and is being monitored closely to  see if he might be a candidate for peripheral intervention.  He was placed on cilostazol  which may have worsened his heart failure and did not provide him any symptomatic benefit for PAD and it has been discontinued.  PMHx:  Past Medical History:  Diagnosis Date   A-fib (HCC)    Acute on chronic systolic (congestive) heart failure (HCC) 06/10/2017   Acute pulmonary edema (HCC)    Anemia    Aortic valve sclerosis 12/2018   Noted on ECHO   Arthritis    CAD (coronary artery disease)    a. 2006: CABG in 2006 with LIMA-LAD, SVG-OM1, and SVG-RPDA   Cardiomegaly 12/2018   Stable, noted on CXR   Carpal tunnel syndrome    Right   Chronic pain 03/21/2016   Colon cancer (HCC) 2006   COPD (chronic obstructive pulmonary disease) (HCC)    Diabetes mellitus without complication (HCC)    DVT (deep venous thrombosis) (HCC)    Right   Essential hypertension 03/21/2016   GERD (gastroesophageal reflux disease)    History of blood transfusion    History of Clostridioides difficile infection    History of prostate cancer 03/21/2016   History of PSVT (paroxysmal supraventricular tachycardia)    HLD (hyperlipidemia)     HTN (hypertension)    Hx of CABG 2006   Hypercholesteremia 03/21/2016   Hypothyroidism    LAE (left atrial enlargement) 12/2018   Severe, Noted on ECHO   LVH (left ventricular hypertrophy) 12/2018   Mild, Noted on ECHO   Medication management 03/21/2016   Mitral annular calcification 12/2018   with mild MS, Noted on ECHO   Morbid obesity (HCC) 03/21/2016   Myocardial infarct (HCC)    OSA (obstructive sleep apnea) 03/21/2016   uses oxygen  at night time   Pain in right ankle and joints of right foot 03/21/2016   Paronychia 03/21/2016   Peripheral vascular disease    Phimosis    Pneumonia    Primary insomnia 03/21/2016   Prostate cancer (HCC) 2008   Pulmonary hypertension (HCC) 12/2018   Moderate, Noted on ECHO   Tobacco dependence 03/21/2016   Tricuspid regurgitation 12/2018   Mild, Noted on ECHO    Past Surgical History:  Procedure Laterality Date   ANKLE SURGERY Right 12/2013   CARDIAC CATHETERIZATION N/A 05/08/2016   Procedure: Left Heart Cath and Cors/Grafts Angiography;  Surgeon: Victory LELON Sharps, MD;  Location: [facility] INVASIVE CV LAB;  Service: Cardiovascular;  Laterality: N/A;   CARDIOVERSION N/A 10/13/2024   Procedure: CARDIOVERSION;  Surgeon: Floretta Mallard, MD;  Location: [facility] INVASIVE CV LAB;  Service: Cardiovascular;  Laterality: N/A;   CIRCUMCISION N/A 10/07/2019   Procedure: CIRCUMCISION ADULT;  Surgeon: Cam Morene LELON, MD;  Location: [facility] ORS;  Service: Urology;  Laterality: N/A;   CORONARY ARTERY BYPASS GRAFT  2006   x3   ELECTROPHYSIOLOGIC STUDY N/A 08/24/2016   Procedure: SVT Ablation;  Surgeon: Will Gladis Norton, MD;  Location: [facility] INVASIVE CV LAB;  Service: Cardiovascular;  Laterality: N/A;   PACEMAKER IMPLANT N/A 08/23/2023   Procedure: PACEMAKER IMPLANT;  Surgeon: Norton Soyla Gladis, MD;  Location: [facility] INVASIVE CV LAB;  Service: Cardiovascular;  Laterality: N/A;   PROSTATE SURGERY  2008   PTCA     UMBILICAL HERNIA REPAIR N/A 05/26/2022   Procedure:  PRIMARY REPAIR OF STRANGULATED UMBILICAL HERNIA WITH PARTIAL OMENTECTOMY;  Surgeon: Aron Shoulders, MD;  Location: [facility] OR;  Service: General;  Laterality: N/A;    FAMHx:  Family History  Problem Relation  Age of Onset   Dementia Mother    Diabetes Sister     SOCHx:   reports that he quit smoking about 7 years ago. His smoking use included cigarettes. He started smoking about 67 years ago. He has a 45 pack-year smoking history. He has never used smokeless tobacco. He reports that he does not currently use alcohol. He reports that he does not use drugs.  ALLERGIES:  Allergies  Allergen Reactions   Shellfish Allergy Shortness Of Breath, Nausea And Vomiting and Rash    Scallops     Fluzone [Influenza Virus Vaccine] Nausea And Vomiting, Palpitations and Rash   Keflex  [Cephalexin ] Rash    Itching and rash on face.     ROS: Pertinent items noted in HPI and remainder of comprehensive ROS otherwise negative.  HOME MEDS: Current Outpatient Medications  Medication Sig Dispense Refill   apixaban  (ELIQUIS ) 5 MG TABS tablet Take 1 tablet (5 mg total) by mouth 2 (two) times daily. 60 tablet 6   atorvastatin  (LIPITOR ) 80 MG tablet Take 80 mg by mouth daily.     benzonatate  (TESSALON ) 200 MG capsule TAKE 1 CAPSULE(200 MG) BY MOUTH THREE TIMES DAILY AS NEEDED 45 capsule 3   budesonide  (PULMICORT ) 0.5 MG/2ML nebulizer solution Take 2 mLs (0.5 mg total) by nebulization 2 (two) times daily. 120 mL 11   cetirizine  (ZYRTEC ) 10 MG tablet Take 1 tablet (10 mg total) by mouth daily. 90 tablet 3   colchicine  0.6 MG tablet Take 0.6 mg by mouth daily as needed (Gout).     dicyclomine  (BENTYL ) 10 MG capsule Take 10 mg by mouth 3 (three) times daily as needed for spasms.     ezetimibe  (ZETIA ) 10 MG tablet Take 1 tablet (10 mg total) by mouth daily. 90 tablet 3   fluticasone  (FLONASE ) 50 MCG/ACT nasal spray Place 1 spray into both nostrils daily. 16 g 2   formoterol  (PERFOROMIST ) 20 MCG/2ML nebulizer solution  Take 2 mLs (20 mcg total) by nebulization 2 (two) times daily. 120 mL 6   furosemide  (LASIX ) 40 MG tablet Take 2 tablets (80 mg total) by mouth daily. 60 tablet 2   glimepiride (AMARYL) 2 MG tablet Take 4 mg by mouth every morning.     levothyroxine  (SYNTHROID , LEVOTHROID) 88 MCG tablet Take 88 mcg by mouth daily before breakfast. Take on an empty stomach     metFORMIN (GLUCOPHAGE-XR) 500 MG 24 hr tablet Take 1,000 mg by mouth daily.     methocarbamol (ROBAXIN) 500 MG tablet Take 500 mg by mouth 2 (two) times daily.     metoprolol  succinate (TOPROL  XL) 50 MG 24 hr tablet Take 1 tablet (50 mg total) by mouth daily. Take with or immediately following a meal. 30 tablet 11   nitroGLYCERIN  (NITROSTAT ) 0.4 MG SL tablet Place 1 tablet (0.4 mg total) under the tongue every 5 (five) minutes as needed for chest pain. 25 tablet 2   omeprazole (PRILOSEC) 40 MG capsule Take 40 mg by mouth daily.     ondansetron  (ZOFRAN -ODT) 4 MG disintegrating tablet Take 4 mg by mouth every 8 (eight) hours as needed for vomiting or nausea.     Potassium Chloride  ER 20 MEQ TBCR Take 20 mEq by mouth daily.     revefenacin  (YUPELRI ) 175 MCG/3ML nebulizer solution Take 3 mLs (175 mcg total) by nebulization daily. 90 mL 5   spironolactone  (ALDACTONE ) 25 MG tablet Take 1 tablet (25 mg total) by mouth daily. 30 tablet 2   No current  facility-administered medications for this visit.    LABS/IMAGING: No results found for this or any previous visit (from the past 48 hours).  No results found.  WEIGHTS: Wt Readings from Last 3 Encounters:  10/23/24 217 lb (98.4 kg)  10/15/24 222 lb 14.2 oz (101.1 kg)  10/02/24 220 lb 6.4 oz (100 kg)    VITALS: BP 110/69   Pulse 60   Ht 5' 8 (1.727 m)   Wt 217 lb (98.4 kg)   SpO2 91%   BMI 32.99 kg/m   EXAM: General appearance: alert, no distress, and pale Neck: no carotid bruit, no JVD, and thyroid  not enlarged, symmetric, no tenderness/mass/nodules Lungs: diminished breath sounds  bilaterally Heart: regular rate and rhythm, S1, S2 normal, and systolic murmur: early systolic 3/6, blowing at apex Abdomen: soft, non-tender; bowel sounds normal; no masses,  no organomegaly Extremities: extremities normal, atraumatic, no cyanosis or edema Pulses: 2+ and symmetric Skin: Skin color, texture, turgor normal. No rashes or lesions Neurologic: Grossly normal Psych: Pleasant  EKG: Deferred  ASSESSMENT: Acute on chronic heart failure with preserved ejection fraction Paroxysmal atrial fibrillation -CHA2DS2-VASc score of 4, currently in sinus rhythm Moderate aortic stenosis and severe mitral stenosis Ischemic colitis with recent GI bleeding Recent SVT which was symptomatic-repeat cardiac catheterization (05/2016) shows patent grafts, status post ablation of AV nodal reentry tachycardia (08/2016) CAD status post three-vessel CABG in 2006 (LIMA to LAD, SVG to OM1, SVG to RPDA) in [place]  Patent bypass grafts by cath in 2015 Dyslipidemia on Lipitor  OSA-BiPAP recommended COPD Chronic systolic congestive heart failure-LVEF 45-50% -improved to 60 to 65% (12/2018)  Phimosis Syncope PAD with symptoms of claudication  PLAN: 1.   Mr. Cauble had recent recurrent atrial fibrillation and associated acute on chronic diastolic heart failure in the setting of post cardioversion.  He did have flat elevated troponins which may be demand ischemia and has a known obstructive disease of an obtuse marginal branch and native right coronary artery.  Since he has had recurrent SVT and now atrial fibrillation for which is causing him symptoms, I think he should be evaluated for A-fib ablation.  He had a prior ablation of AV nodal reentry tachycardia.  This was successful.  We had previously discussed the Watchman procedure however he was felt not to be a candidate because of bleeding risk however now he has been on anticoagulation for 8 months without significant anemia after a coagulation procedure at  [facility] to stop bleeding of the small bowel.  He might be a candidate for watchman in the future.  Will refer back to cardiac EP to see if there are options to manage his A-fib.  Remain off cilostazol .  He has follow-up in January with Dr. Pearline for PAD.  He will keep an appointment with me in January as well.  Vinie KYM Maxcy, MD, [facility], FNLA, FACP  [place]  [facility]  Medical Director of the Advanced Lipid Disorders &  Cardiovascular Risk Reduction Clinic Diplomate of the American Board of Clinical Lipidology Attending Cardiologist  Direct Dial: [phone]  Fax: [phone]  Website:  [email or web address] Vinie BROCKS Kahlin Mark 10/23/2024, 12:28 PM

## 2024-10-23 NOTE — Patient Instructions (Signed)
 Medication Instructions:   NO CHANGES  *If you need a refill on your cardiac medications before your next appointment, please call your pharmacy*   Follow-Up: At [facility], you and your health needs are our priority.  As part of our continuing mission to provide you with exceptional heart care, our providers are all part of one team.  This team includes your primary Cardiologist (physician) and Advanced Practice Providers or APPs (Physician Assistants and Nurse Practitioners) who all work together to provide you with the care you need, when you need it.  Your next appointment:    Schedule appointment with Dr. Inocencio for AFIB Ablation Evaluation  Appointment with Dr. Court has been cancelled  We recommend signing up for the patient portal called MyChart.  Sign up information is provided on this After Visit Summary.  MyChart is used to connect with patients for Virtual Visits (Telemedicine).  Patients are able to view lab/test results, encounter notes, upcoming appointments, etc.  Non-urgent messages can be sent to your provider as well.   To learn more about what you can do with MyChart, go to [email or web address].

## 2024-11-07 ENCOUNTER — Other Ambulatory Visit (HOSPITAL_COMMUNITY): Payer: Self-pay

## 2024-11-18 ENCOUNTER — Encounter (HOSPITAL_COMMUNITY): Payer: Self-pay

## 2024-11-18 ENCOUNTER — Ambulatory Visit (HOSPITAL_COMMUNITY)

## 2024-11-23 ENCOUNTER — Ambulatory Visit: Payer: Federal, State, Local not specified - PPO

## 2024-11-23 DIAGNOSIS — I480 Paroxysmal atrial fibrillation: Secondary | ICD-10-CM | POA: Diagnosis not present

## 2024-11-24 LAB — CUP PACEART REMOTE DEVICE CHECK
Battery Remaining Longevity: 116 mo
Battery Remaining Percentage: 91 %
Battery Voltage: 2.99 V
Brady Statistic AP VP Percent: 12 %
Brady Statistic AP VS Percent: 7.7 %
Brady Statistic AS VP Percent: 23 %
Brady Statistic AS VS Percent: 57 %
Brady Statistic RA Percent Paced: 18 %
Brady Statistic RV Percent Paced: 34 %
Date Time Interrogation Session: 20251222023523
Implantable Lead Connection Status: 753985
Implantable Lead Connection Status: 753985
Implantable Lead Implant Date: 20240920
Implantable Lead Implant Date: 20240920
Implantable Lead Location: 753859
Implantable Lead Location: 753860
Implantable Pulse Generator Implant Date: 20240920
Lead Channel Impedance Value: 490 Ohm
Lead Channel Impedance Value: 490 Ohm
Lead Channel Pacing Threshold Amplitude: 0.5 V
Lead Channel Pacing Threshold Amplitude: 0.75 V
Lead Channel Pacing Threshold Pulse Width: 0.5 ms
Lead Channel Pacing Threshold Pulse Width: 0.5 ms
Lead Channel Sensing Intrinsic Amplitude: 12 mV
Lead Channel Sensing Intrinsic Amplitude: 4.9 mV
Lead Channel Setting Pacing Amplitude: 1 V
Lead Channel Setting Pacing Amplitude: 1.5 V
Lead Channel Setting Pacing Pulse Width: 0.5 ms
Lead Channel Setting Sensing Sensitivity: 2 mV
Pulse Gen Model: 2272
Pulse Gen Serial Number: 8213521

## 2024-11-25 NOTE — Progress Notes (Signed)
 Remote PPM Transmission

## 2024-11-27 ENCOUNTER — Ambulatory Visit: Payer: Self-pay | Admitting: Cardiology

## 2024-12-01 NOTE — Progress Notes (Unsigned)
 " Electrophysiology Office Note:   Date:  12/02/2024  ID:  Jeremiah [name], DOB [birth year], MRN [id number]  Primary Cardiologist: Lurena MARLA Red, MD Primary Heart Failure: None Electrophysiologist: Aniket Paye Gladis Norton, MD      History of Present Illness:   Jeremiah [name] is a 78 y.o. male with h/o chronic diastolic heart failure, atrial fibrillation, aortic stenosis, mitral stenosis, coronary artery disease post CABG, hyperlipidemia, sleep apnea, COPD, complete heart block seen today for routine electrophysiology follow-up s/p Pacemaker implant.  Discussed the use of AI scribe software for clinical note transcription with the patient, who gave verbal consent to proceed.  History of Present Illness Jeremiah [name] is a 78 year old male with atrial fibrillation and a pacemaker who presents for routine follow-up.  He has no issues with his pacemaker, which is pacing the ventricles approximately 35-36% of the time, and the device has about ten years of battery life remaining.  He experienced a past episode of atrial fibrillation that required hospitalization but has had no recurrences since then. He currently feels well and is in sinus rhythm.  He experiences leg pain due to peripheral artery disease with blockages in both legs. He manages the pain by resting and using oxygen  as needed.  He had a gastrointestinal bleed a year ago, which resolved in December of last year. The bleed originated from the upper intestine. He is currently taking Eliquis . No current issues with bleeding.  he denies chest pain, palpitations, dyspnea, PND, orthopnea, nausea, vomiting, dizziness, syncope, edema, weight gain, or early satiety.    Review of systems complete and found to be negative unless listed in HPI.      EP Information / Studies Reviewed:    EKG is not ordered today. EKG from 10/15/2024 reviewed which showed sinus rhythm, prolonged QTc      PPM Interrogation-  reviewed in detail  today,  See PACEART report.  Device History: Abbott Dual Chamber PPM implanted 08/23/2023 for CHB  Risk Assessment/Calculations:    CHA2DS2-VASc Score = 4   This indicates a 4.8% annual risk of stroke. The patient's score is based upon: CHF History: 0 HTN History: 1 Diabetes History: 0 Stroke History: 0 Vascular Disease History: 1 Age Score: 2 Gender Score: 0           Physical Exam:   VS:  BP 106/64 (BP Location: Left Arm, Patient Position: Sitting, Cuff Size: Normal)   Pulse 75   Ht 5' 8 (1.727 m)   Wt 223 lb 9.6 oz (101.4 kg)   SpO2 93%   BMI 34.00 kg/m    Wt Readings from Last 3 Encounters:  12/02/24 223 lb 9.6 oz (101.4 kg)  10/23/24 217 lb (98.4 kg)  10/15/24 222 lb 14.2 oz (101.1 kg)     GEN: Well nourished, well developed in no acute distress NECK: No JVD; No carotid bruits CARDIAC: Regular rate and rhythm, no murmurs, rubs, gallops RESPIRATORY:  Clear to auscultation without rales, wheezing or rhonchi  ABDOMEN: Soft, non-tender, non-distended EXTREMITIES:  No edema; No deformity   ASSESSMENT AND PLAN:    CHB s/p Abbott PPM  Normal PPM function See Pace Art report No changes today  2.  Acute on chronic diastolic heart failure  3.  Moderate aortic and mitral stenosis  4.  Coronary artery disease: Post CABG x3   5.  Plan per primary cardiology.  6.  Obstructive sleep apnea: BiPAP compliance encouraged  7.  Paroxysmal atrial fibrillation: Post cardioversion 10/13/2024.  Remains in sinus rhythm.  Leinani Lisbon follow-up in A-fib clinic if A-fib recurs.  8.  Secondary hypercoagulable state: On Eliquis   Disposition:   Follow up with EP Team in 12 months  Signed, Doryce Mcgregory Gladis Norton, MD  "

## 2024-12-02 ENCOUNTER — Ambulatory Visit: Attending: Cardiology | Admitting: Cardiology

## 2024-12-02 ENCOUNTER — Encounter: Payer: Self-pay | Admitting: Cardiology

## 2024-12-02 VITALS — BP 106/64 | HR 75 | Ht 68.0 in | Wt 223.6 lb

## 2024-12-02 DIAGNOSIS — I442 Atrioventricular block, complete: Secondary | ICD-10-CM

## 2024-12-02 DIAGNOSIS — D6869 Other thrombophilia: Secondary | ICD-10-CM | POA: Diagnosis not present

## 2024-12-02 DIAGNOSIS — I480 Paroxysmal atrial fibrillation: Secondary | ICD-10-CM

## 2024-12-02 DIAGNOSIS — I2510 Atherosclerotic heart disease of native coronary artery without angina pectoris: Secondary | ICD-10-CM | POA: Diagnosis not present

## 2024-12-02 LAB — CUP PACEART INCLINIC DEVICE CHECK
Battery Remaining Longevity: 118 mo
Battery Voltage: 3.01 V
Brady Statistic RA Percent Paced: 18 %
Brady Statistic RV Percent Paced: 36 %
Date Time Interrogation Session: 20251231203910
Implantable Lead Connection Status: 753985
Implantable Lead Connection Status: 753985
Implantable Lead Implant Date: 20240920
Implantable Lead Implant Date: 20240920
Implantable Lead Location: 753859
Implantable Lead Location: 753860
Implantable Pulse Generator Implant Date: 20240920
Lead Channel Impedance Value: 475 Ohm
Lead Channel Impedance Value: 475 Ohm
Lead Channel Pacing Threshold Amplitude: 0.5 V
Lead Channel Pacing Threshold Amplitude: 0.75 V
Lead Channel Pacing Threshold Pulse Width: 0.5 ms
Lead Channel Pacing Threshold Pulse Width: 0.5 ms
Lead Channel Sensing Intrinsic Amplitude: 12 mV
Lead Channel Sensing Intrinsic Amplitude: 5 mV
Lead Channel Setting Pacing Amplitude: 1 V
Lead Channel Setting Pacing Amplitude: 1.5 V
Lead Channel Setting Pacing Pulse Width: 0.5 ms
Lead Channel Setting Sensing Sensitivity: 2 mV
Pulse Gen Model: 2272
Pulse Gen Serial Number: 8213521

## 2024-12-02 NOTE — Patient Instructions (Signed)
 Medication Instructions:  Your physician recommends that you continue on your current medications as directed. Please refer to the Current Medication list given to you today.  *If you need a refill on your cardiac medications before your next appointment, please call your pharmacy*  Lab Work: None ordered.  If you have labs (blood work) drawn today and your tests are completely normal, you will receive your results only by: MyChart Message (if you have MyChart) OR A paper copy in the mail If you have any lab test that is abnormal or we need to change your treatment, we will call you to review the results.  Testing/Procedures: None ordered.   Follow-Up: At [facility], you and your health needs are our priority.  As part of our continuing mission to provide you with exceptional heart care, our providers are all part of one team.  This team includes your primary Cardiologist (physician) and Advanced Practice Providers or APPs (Physician Assistants and Nurse Practitioners) who all work together to provide you with the care you need, when you need it.  Your next appointment:   12 months with Dr Inocencio

## 2024-12-04 ENCOUNTER — Ambulatory Visit: Payer: Self-pay | Admitting: Cardiology

## 2024-12-08 ENCOUNTER — Ambulatory Visit: Admitting: Internal Medicine

## 2024-12-08 ENCOUNTER — Encounter: Payer: Self-pay | Admitting: Internal Medicine

## 2024-12-08 VITALS — BP 118/62 | HR 71 | Wt 222.8 lb

## 2024-12-08 DIAGNOSIS — J9612 Chronic respiratory failure with hypercapnia: Secondary | ICD-10-CM

## 2024-12-08 DIAGNOSIS — Z87891 Personal history of nicotine dependence: Secondary | ICD-10-CM

## 2024-12-08 DIAGNOSIS — J9611 Chronic respiratory failure with hypoxia: Secondary | ICD-10-CM

## 2024-12-08 DIAGNOSIS — J449 Chronic obstructive pulmonary disease, unspecified: Secondary | ICD-10-CM

## 2024-12-08 DIAGNOSIS — R0602 Shortness of breath: Secondary | ICD-10-CM

## 2024-12-08 NOTE — Progress Notes (Signed)
 "   Subjective:     Patient ID: Jeremiah [name], male   DOB: Aug 01, 1946    MRN: [id number]     Brief patient profile:  22 yowm MM/quit smoking 12/15/16 with onset of doe x 2008 and GOLD III criteria confirmed 10/28/2018 with restictive component due to obesity / prev eval for osa but could not tol cpap.  After treatment with a Lama/lama he improved to GOLD II criteria as of 02/05/2019     History of Present Illness   02/12/2017 1st [facility] office visit/ Jeremiah [name]  Re ? Copd  Chief Complaint  Patient presents with   Pulmonary Consult    Referred by Dr. Regino. Pt c/o SOB for the past 10 yrs. He gets SOB when running and working which I don't do anymore.   MMRC2 = can't walk a nl pace on a flat grade s sob but does fine slow and flat eg shopping / limited also by R chronic ankle pain p fx rec Stop lopressor  (metaprolol) and start bisoprolol  5 mg twice daily  If face/eye symptoms  worse or fever go back to ER  Please schedule a follow up office visit in 4 weeks, sooner if needed with pfts on return     Date of Admission: 12/25/2018                      Date of Discharge: 12/26/2018   Discharge Diagnoses/Problem List:  CHF exacerbation Symptomatic anemia COPD CAD DM-II HTN HLD Hypothyroidism OSA    Issues for Follow Up:  CHF - patient on bisoprolol  and lasix  at home.  Consider increasing lasix  dose and adding ARB Anemia - patient anemic with low ferritin.  Get CBC Colonoscopy - patient overdue for colonoscopy.  Important in setting of anemia.   COPD - on duonebs at home PRN. Wheezing present on exam. Consider adding a controller medication.  Diabetes - consider discontinuing glimepiride given      01/01/2019  Extended post hosp f/u  ov/Jeremiah [name] re: copd/ chf/ anemia - not on maint rx, just duoneb prn  Chief Complaint  Patient presents with   Hospitalization Follow-up    Breathing has improved. He is now on 4lpm o2 24/7. CT Angiogram done today.    Dyspnea:  MMRC4  = sob if  tries to leave home or while getting dressed  Even on 02 but ok at rest sitting  Cough: min dry  Sleeping: 45 degrees  SABA use: duoneb helps the most 02: 4lpm 24 /7  rec Plan A = Automatic = performist 20 mcg twice daily in neb with yupelri  175 mcg each am  Plan B = Backup Only use your albuterol  nebulizer as a rescue medication  Goal for 02 is to keep sats above 90% at all times so may be able to adjust back down to 2lpm at rest to save on protable tank  and turn up with walking to whatever setting needed to reach this goal. Please schedule a follow up office visit in 4 weeks, sooner if needed  with all medications /inhalers/ solutions in hand so we can verify exactly what you are taking. This includes all medications from all doctors and over the counters  - PFTs on return       06/20/2023  f/u ov/Jeremiah [name] re: GOLD 2 / 02 at hs    maint on perforomist /yupelri /budesonide     Chief Complaint  Patient presents with   Follow-up    Follow up from hospital visit.  Low SaO2 with anemia episodes.   Dyspnea:  25% of baseline  Cough: none  Sleeping: back in bed  flat with 2pillows  SABA use: none  02: 2lpm hs/ none daytime  Rec: no changes   NP recs  03/10/2024 Increase Budesonide  Neb Twice daily   Restart Perforomist  neb Twice daily   Use Yupelri  Neb daily  Activity as tolerated Continue on Oxygen  2l/m with activity and 4l/m At bedtime   Restart LDCT chest yearly screening program.  Delsym 2 tsp Twice daily for cough As needed   Tessalon  Three times a day  for cough as needed.   LDSCT  04/23/24 RADS1 . Mild paraseptal and centrilobular emphysema with diffuse bronchial wall thickening  - Echo 10/14/24 c/w mild MR/ AS and LAE    12/08/2024  f/u ov/Jeremiah [name] re: COPD GOLD 2/ 02 at hs  maint on triple rx  per negb  Chief Complaint  Patient presents with   Medical Management of Chronic Issues    COPD f/u- Coughing w/ clear phlegm, SOB during exertion.   Dyspnea:  limited by claudication more so  than doe  Cough: no am flares/ variably productive mucoid min vol  Sleeping: bed is flat 2 pillows  s resp cc no longer using cpap  SABA use: just neb  02: 4 lpm   Lung cancer screening : due  q  may    No obvious day to day or daytime variability or assoc excess/ purulent sputum or mucus plugs or hemoptysis or cp or chest tightness, subjective wheeze or overt sinus or hb symptoms.    Also denies any obvious fluctuation of symptoms with weather or environmental changes or other aggravating or alleviating factors except as outlined above   No unusual exposure hx or h/o childhood pna/ asthma or knowledge of premature birth.  Current Allergies, Complete Past Medical History, Past Surgical History, Family History, and Social History were reviewed in [employer] record.  ROS  The following are not active complaints unless bolded Hoarseness, sore throat, dysphagia, dental problems, itching, sneezing,  nasal congestion or discharge of excess mucus or purulent secretions, ear ache,   fever, chills, sweats, unintended wt loss or wt gain, classically pleuritic or exertional cp,  orthopnea pnd or arm/hand swelling  or leg swelling, presyncope, palpitations, abdominal pain, anorexia, nausea, vomiting, diarrhea  or change in bowel habits or change in bladder habits, change in stools or change in urine, dysuria, hematuria,  rash, arthralgias, visual complaints, headache, numbness, weakness or ataxia or problems with walking or coordination,  change in mood or  memory. Claudication both legs equal          Outpatient Medications Prior to Visit  Medication Sig Dispense Refill   apixaban  (ELIQUIS ) 5 MG TABS tablet Take 1 tablet (5 mg total) by mouth 2 (two) times daily. 60 tablet 6   ASPIRIN  81 PO Take 81 mg by mouth daily.     atorvastatin  (LIPITOR ) 80 MG tablet Take 80 mg by mouth daily.     benzonatate  (TESSALON ) 200 MG capsule TAKE 1 CAPSULE(200 MG) BY MOUTH THREE TIMES DAILY AS  NEEDED 45 capsule 3   budesonide  (PULMICORT ) 0.5 MG/2ML nebulizer solution Take 2 mLs (0.5 mg total) by nebulization 2 (two) times daily. 120 mL 11   cetirizine  (ZYRTEC ) 10 MG tablet Take 1 tablet (10 mg total) by mouth daily. 90 tablet 3   colchicine  0.6 MG tablet Take 0.6 mg by mouth daily as needed (Gout).  dexlansoprazole (DEXILANT) 60 MG capsule Take 60 mg by mouth daily.     dicyclomine  (BENTYL ) 10 MG capsule Take 10 mg by mouth 3 (three) times daily as needed for spasms.     ezetimibe  (ZETIA ) 10 MG tablet Take 1 tablet (10 mg total) by mouth daily. 90 tablet 3   ferrous sulfate  325 (65 FE) MG tablet Take 325 mg by mouth daily with breakfast.     fluticasone  (FLONASE ) 50 MCG/ACT nasal spray Place 1 spray into both nostrils daily. 16 g 2   furosemide  (LASIX ) 40 MG tablet Take 2 tablets (80 mg total) by mouth daily. 60 tablet 2   glimepiride (AMARYL) 2 MG tablet Take 4 mg by mouth every morning.     levothyroxine  (SYNTHROID , LEVOTHROID) 88 MCG tablet Take 88 mcg by mouth daily before breakfast. Take on an empty stomach     metFORMIN (GLUCOPHAGE-XR) 500 MG 24 hr tablet Take 1,000 mg by mouth daily.     methocarbamol (ROBAXIN) 500 MG tablet Take 500 mg by mouth 2 (two) times daily.     metoprolol  succinate (TOPROL  XL) 50 MG 24 hr tablet Take 1 tablet (50 mg total) by mouth daily. Take with or immediately following a meal. 30 tablet 11   nitroGLYCERIN  (NITROSTAT ) 0.4 MG SL tablet Place 1 tablet (0.4 mg total) under the tongue every 5 (five) minutes as needed for chest pain. 25 tablet 2   omeprazole (PRILOSEC) 40 MG capsule Take 40 mg by mouth daily.     ondansetron  (ZOFRAN -ODT) 4 MG disintegrating tablet Take 4 mg by mouth every 8 (eight) hours as needed for vomiting or nausea.     Potassium Chloride  ER 20 MEQ TBCR Take 20 mEq by mouth daily.     revefenacin  (YUPELRI ) 175 MCG/3ML nebulizer solution Take 3 mLs (175 mcg total) by nebulization daily. 90 mL 5   spironolactone  (ALDACTONE ) 25 MG  tablet Take 1 tablet (25 mg total) by mouth daily. 30 tablet 2   traZODone (DESYREL) 50 MG tablet Take 50 mg by mouth at bedtime.     formoterol  (PERFOROMIST ) 20 MCG/2ML nebulizer solution Take 2 mLs (20 mcg total) by nebulization 2 (two) times daily. (Patient not taking: Reported on 12/08/2024) 120 mL 6   No facility-administered medications prior to visit.       Objective:   Physical Exam  Wts  12/08/2024         222  06/20/2023       193  08/21/2022       228  07/20/2021       238  02/05/2019         251  01/01/2019       255  10/28/2018     252  08/26/2017       246  05/02/2017       248  04/01/2017      244   02/12/17 248 lb (112.5 kg)  02/11/17 239 lb (108.4 kg)  01/02/17 245 lb (111.1 kg)    Vital signs reviewed  12/08/2024  - Note at rest 02 sats  96% on RA   General appearance:    amb mod obese ( by bmi) wm nad    HEENT : Oropharynx  clear      NECK :  without  apparent JVD/ palpable Nodes/TM    LUNGS: no acc muscle use,  Min barrel  contour chest wall with bilateral  slightly decreased bs s audible wheeze and  without cough on  insp or exp maneuvers and min  Hyperresonant  to  percussion bilaterally    CV:  RRR  no s3  2/6 sem  s increase in P2, and no edema   ABD:  soft and nontender    MS:  Nl gait/ ext warm without deformities Or obvious joint restrictions  calf tenderness, cyanosis or clubbing     SKIN: warm and dry without lesions    NEURO:  alert, approp, nl sensorium with  no motor or cerebellar deficits apparent.           Assessment:     Assessment & Plan COPD GOLD II with reversibility Quit smoking 12/2016/MM Spirometry 02/12/2017  FEV1 0.79 (20%)  Ratio 45 but f/v loop is junk - trial of selective BB 02/12/2017  - PFT's  04/01/2017  FEV1 1.29 ( 44% ) ratio 55  p no % improvement from saba p ?  prior to study with DLCO  65/67 % corrects to 85  % for alv volume  - 05/02/2017  After extensive coaching HFA effectiveness =    75% > try bevespi  samples x 2 weeks   > no objective change so did not continue  - Referred to rehab 08/26/2017 > improved - PFT's  10/28/2018  FEV1 1.25 (43 % ) ratio 59  p 7 % improvement from saba p nothing prior to study with DLCO  67 % corrects to 88 % for alv volume  With mild curvature/ insp truncation s plateau  - alpha one screen 10/28/2018   MM  Level 144  - 01/01/2019 changed to yupelri / performist  PFT's  02/05/2019  FEV1 1.53 (53 % ) ratio 0.76  p 20 % improvement from saba p performist/yupelri  prior to study with DLCO  61/80c % corrects to 120  % for alv volume   -note follow-up flow volume loop suggests a non-fixed and inspiratory truncation c/w the effects of morbid obesity on the upper airway  in patients with a history of sleep apnea though no classic sawtooth pattern is present.  - added yupelri  back 07/20/2021 >>> reported better ex tol 08/21/2022  LDSCT  04/23/24  Mild paraseptal and centrilobular emphysema with diffuse bronchial wall thickening   Group E in terms of symptoms/risk so  laba/lama/ICS  therefore appropriate rx at this point  >>>  continue triple rx per nebs   and approp SABA prn.    Exertional shortness of breath Spirometry 02/12/2017  FEV1 0.79 (20%)  Ratio 45 but f/v loop is junk - trial of selective BB 02/12/2017 >>> - Echo 10/14/24 c/w mild MR/ AS and LAE   >>> note symptoms of valvular ht dz are indistinguishable from doe from COPD though the latter may respond better to bronchdilator rx and it's all the more important in this setting (and due to claudication) that he keep close track of his 02 sats esp with exertion.  Chronic respiratory failure with hypoxia and hypercapnia (HCC) HCO3  03/02/17 = 37  -  04/01/2017 : Patient Saturations on Room Air at Rest = 96%  And  while Ambulating = 88% Patient Saturations on 2 Liters of oxygen  while Ambulating = 94% - HCO3  12/26/18  = 34  - portable 02 eval  10/28.22 neeeded 4lpm Pulsed to maintain sats walking so rec 2 refillable ML6 tanks/ 3lpm pulsed sitting   - HC03   05/27/22  = 26    As of  12/08/2024    02 4lpm hs and prn but not following  hjis 02 sats as rec  -  see avs for instructions unique to this ov       >>>   F/u 6 months, sooner if needed  Each maintenance medication was reviewed in detail including emphasizing most importantly the difference between maintenance and prns and under what circumstances the prns are to be triggered using an action plan format where appropriate.  Total time for H and P, chart review, counseling, reviewing hfa/neb/ 02 /pulse ox  device(s) and generating customized AVS unique to this office visit / same day charting = 34 miin          AVS  Patient Instructions  Make sure you check your oxygen  saturation  AT  your highest level of activity (not after you stop)   to be sure it stays over 90% and adjust  02 flow upward to maintain this level if needed but remember to turn it back to previous settings when you stop (to conserve your supply).   No change in medications    Please schedule a follow up visit in 6  months but call sooner if needed       Jeremiah America, MD 12/12/2024     "

## 2024-12-08 NOTE — Patient Instructions (Addendum)
Make sure you check your oxygen saturation  AT  your highest level of activity (not after you stop)   to be sure it stays over 90% and adjust  02 flow upward to maintain this level if needed but remember to turn it back to previous settings when you stop (to conserve your supply).   No change in medications   Please schedule a follow up visit in 6  months but call sooner if needed  

## 2024-12-12 NOTE — Assessment & Plan Note (Addendum)
 Quit smoking 12/2016/MM Spirometry 02/12/2017  FEV1 0.79 (20%)  Ratio 45 but f/v loop is junk - trial of selective BB 02/12/2017  - PFT's  04/01/2017  FEV1 1.29 ( 44% ) ratio 55  p no % improvement from saba p ?  prior to study with DLCO  65/67 % corrects to 85  % for alv volume  - 05/02/2017  After extensive coaching HFA effectiveness =    75% > try bevespi  samples x 2 weeks  > no objective change so did not continue  - Referred to rehab 08/26/2017 > improved - PFT's  10/28/2018  FEV1 1.25 (43 % ) ratio 59  p 7 % improvement from saba p nothing prior to study with DLCO  67 % corrects to 88 % for alv volume  With mild curvature/ insp truncation s plateau  - alpha one screen 10/28/2018   MM  Level 144  - 01/01/2019 changed to yupelri / performist  PFT's  02/05/2019  FEV1 1.53 (53 % ) ratio 0.76  p 20 % improvement from saba p performist/yupelri  prior to study with DLCO  61/80c % corrects to 120  % for alv volume   -note follow-up flow volume loop suggests a non-fixed and inspiratory truncation c/w the effects of morbid obesity on the upper airway  in patients with a history of sleep apnea though no classic sawtooth pattern is present.  - added yupelri  back 07/20/2021 >>> reported better ex tol 08/21/2022  LDSCT  04/23/24  Mild paraseptal and centrilobular emphysema with diffuse bronchial wall thickening   Group E in terms of symptoms/risk so  laba/lama/ICS  therefore appropriate rx at this point  >>>  continue triple rx per nebs   and approp SABA prn.

## 2024-12-12 NOTE — Assessment & Plan Note (Addendum)
 Spirometry 02/12/2017  FEV1 0.79 (20%)  Ratio 45 but f/v loop is junk - trial of selective BB 02/12/2017 >>> - Echo 10/14/24 c/w mild MR/ AS and LAE   >>> note symptoms of valvular ht dz are indistinguishable from doe from COPD though the latter may respond better to bronchdilator rx and it's all the more important in this setting (and due to claudication) that he keep close track of his 02 sats esp with exertion.

## 2024-12-12 NOTE — Assessment & Plan Note (Addendum)
 HCO3  03/02/17 = 37  -  04/01/2017 : Patient Saturations on Room Air at Rest = 96%  And  while Ambulating = 88% Patient Saturations on 2 Liters of oxygen  while Ambulating = 94% - HCO3  12/26/18  = 34  - portable 02 eval  10/28.22 neeeded 4lpm Pulsed to maintain sats walking so rec 2 refillable ML6 tanks/ 3lpm pulsed sitting  - HC03   05/27/22  = 26    As of  12/08/2024    02 4lpm hs and prn but not following hjis 02 sats as rec  -  see avs for instructions unique to this ov       >>>   F/u 6 months, sooner if needed  Each maintenance medication was reviewed in detail including emphasizing most importantly the difference between maintenance and prns and under what circumstances the prns are to be triggered using an action plan format where appropriate.  Total time for H and P, chart review, counseling, reviewing hfa/neb/ 02 /pulse ox  device(s) and generating customized AVS unique to this office visit / same day charting = 34 miin

## 2024-12-16 ENCOUNTER — Ambulatory Visit: Admitting: Cardiovascular Disease

## 2024-12-18 ENCOUNTER — Ambulatory Visit: Attending: Internal Medicine | Admitting: Internal Medicine

## 2024-12-18 ENCOUNTER — Encounter: Payer: Self-pay | Admitting: Internal Medicine

## 2024-12-18 VITALS — BP 110/72 | HR 92 | Ht 68.0 in | Wt 223.0 lb

## 2024-12-18 DIAGNOSIS — I442 Atrioventricular block, complete: Secondary | ICD-10-CM | POA: Diagnosis not present

## 2024-12-18 DIAGNOSIS — D6869 Other thrombophilia: Secondary | ICD-10-CM | POA: Diagnosis not present

## 2024-12-18 DIAGNOSIS — I480 Paroxysmal atrial fibrillation: Secondary | ICD-10-CM | POA: Diagnosis not present

## 2024-12-18 DIAGNOSIS — I5032 Chronic diastolic (congestive) heart failure: Secondary | ICD-10-CM

## 2024-12-18 DIAGNOSIS — Z951 Presence of aortocoronary bypass graft: Secondary | ICD-10-CM | POA: Diagnosis not present

## 2024-12-18 DIAGNOSIS — Z950 Presence of cardiac pacemaker: Secondary | ICD-10-CM

## 2024-12-18 NOTE — Progress Notes (Signed)
 "    OFFICE NOTE  Chief Complaint:  Follow-up  Primary Care Physician: Regino Slater, MD  HPI:  Jeremiah [name] is a 79 y.o. 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 He is originally from [place]  and his wife who is accompanying him today is originally from [place] . [name] had an MI in 2006 and underwent coronary artery bypass grafting with a LIMA to LAD, SVG to OM1, and SVG to RPDA. He did fairly well with this however 2015 was having chest discomfort. He underwent a nuclear stress test which showed possible reversible ischemia. Therefore he was referred for cardiac catheterization at the [facility] [place] . That demonstrated all 3 patent bypass grafts with multivessel coronary disease. At that time he was also having episodes of palpitations and a monitor demonstrated PSVT. He was scheduled for possible ablation but is had chronic problems with an ankle fracture and nonhealing wound. He therefore never underwent ablation. He was placed on digoxin  and has been on metoprolol  but stopped the digoxin  at some point in the past. He reports over the past 8-12 months she's had no further palpitations. He also has a history of dyslipidemia, hypothyroidism, and borderline diabetes. Recently he had a sleep study through [facility] physicians which demonstrated severe obstructive sleep apnea and BiPAP was recommended with settings of 19/14 cm water  pressure. Currently he denies any chest pain or worsening shortness of breath. His last echocardiogram from his cardiologist in [place]  was a 2015 which showed an EF of 58% and no significant valvular disease.  07/26/2016  Jeremiah [name] returns today for follow-up. He was recently seen in the emergency department in the beginning of June for chest pain. This was associated with tachycardia and probable SVT. Troponin was noted to be elevated to 0.12. He had signs and symptoms concerning for unstable angina. He was evaluated by  my partners and felt to need cardiac catheterization. Eventually he underwent cardiac catheterization by Dr. Claudene with the results as follows:  Conclusion   LM lesion, 85% stenosed. Ost 1st Mrg lesion, 100% stenosed. SVG was injected . Ost RCA to Prox RCA lesion, 100% stenosed. SVG . Origin to Prox Graft lesion, 40% stenosed. Mid RCA to Dist RCA lesion, 100% stenosed. Prox LAD lesion, 100% stenosed. LIMA .   Patent saphenous vein grafts. SVG to PDA contains eccentric 40% proximal stenosis. SVG to the circumflex is widely patent. LIMA to the LAD is widely patent. Native distal left main contains 75% stenosis. The dominant obtuse marginal branch is totally occluded. The native right coronary is totally occluded. Left ventriculogram demonstrates inferior wall hypokinesis. EF 50%. Recent chest pain in the setting of PSVT with minimal enzyme elevation likely related to demand ischemia in the setting of underlying native coronary disease.   Recommendations: Medical therapy. Management of PSVT with medications versus ablation.   Since discharge she denies any further episodes of tachycardia palpitations. He is already on a high dose of beta blocker. He's had a small amount of weight gain but has stopped smoking.  He plans to start to do more exercise and work on weight loss. I'm concerned about his recurrent palpitations and the fact that asymptomatic and has demand ischemia related to them and bypass graft insufficiency.  06/10/2017  Jeremiah [name] returns today for follow-up. It's been almost a year since I last saw him. He was found to have recurrent symptomatic SVT and underwent comprehensive EP study and was diagnosed with classic AV  nodal reentry tachycardia and subsequently underwent selective radiofrequency ablation by Dr. Inocencio. He says since that time he's had no further episodes of tachypalpitations. Since then he was hospitalized twice, after developing C. difficile colitis and  enteritis with GI bleed in January. Subsequently he developed acute congestive heart failure was found to have a mild reduction in LVEF to 45-50% by echo in March 2018. At that time he was on furosemide  40 mg daily. He had a follow-up with Hao Meng, PA-C in April who noted he was on Lasix  40 mg twice daily, however he reported that he was not taking that to me today. Fact he says he is now off of Lasix . He reports worsening shortness of breath and hasn't fact had a 13 pound weight gain since April. He denies any lower extremity edema. He has been working with Dr. Darlean for his shortness of breath.  07/22/2017  Jeremiah [name] is back today for follow-up. He restarted his Lasix  at my direction his weight is now down from 252-244. He's had significant improvement in swelling and reports some improvement in his breathing however he says it still not good. He does have a mildly reduced EF of 45-50% but also has some chronic lung disease which is likely contributing. He says he gets short of breath while doing certain activities in the yard and then comes inside and uses his oxygen  which improves him almost immediately. He denies any orthopnea. He has not had any recent or productive cough. He has a follow-up visit with his pulmonologist tomorrow. I had ordered lab work including a metabolic profile and BNP which was never obtained.  09/09/2018  Jeremiah [name] is seen today in follow-up.  He reports some improvement in his shortness of breath.  He has joined a gym and now is exercising every day.  Weight however has been stable at 252, actually up 2 pounds from April.  Despite this his breathing has improved somewhat.  He still uses oxygen .  He did have one episode recently where he became nauseated and vomited.  He was [place] the [facility] [employer] [facility] [place] and had been taking antibiotics for a dental infection.  He did undergo CT scan of the head and neck and that demonstrated bilateral carotid artery calcification.  This is not  surprising given his history of coronary artery disease and prior CABG however he has not had prior carotid Dopplers.    02/18/2019  Jeremiah [name] is seen today in follow-up.  Recently he says has had some improvement in shortness of breath.  Dr. Darlean, his pulmonologist had started him on a new nebulizer called revefenacin .  His most recent hemoglobin A1c was 7.1.  Weight is down about 4 pounds.  He denies any new chest pain.  He has had some recent anemia and work-up is apparently underway for this.  He is tentatively scheduled for an endoscopy on April 16 with Dr. Saintclair.  He is concerned because of his pulmonary status that he may be at high risk of decompensation with the procedure.  He also has follow-up with his PCP in the near future.  09/14/2019  Jeremiah [name] returns today for follow-up.  He continues to do well.  His shortness of breath has improved which I think was both believed due to a combination of pulmonary optimization as well as improvement in LV function.  Recently his echo in fact showed LVEF has normalized to 60 to 65%.  He remains euvolemic and is actually 2 pounds lighter than he  was seen about 8 months ago.  Hemoglobin A1c has been reasonably well-controlled however his lipids are still uncontrolled.  His total cholesterol is 211, triglycerides 494, HDL 32 and LDL of 114.  His target LDL is less than 70 and his elevated triglycerides also put him at increased cardiovascular risk.  03/02/2020  Jeremiah [name] is seen today in follow-up.  He denies any chest pain but does have some shortness of breath.  Is been some weight gain.  He reports less physical activity has been struggling with a long recovery after undergoing circumcision for issues with phimosis.  Its unfortunate that he had a difficult recovery but seems to be doing better.  Blood pressure was well controlled today.  His EKG demonstrated some worsening inferior and lateral T wave inversions.  This was previously seen however not as  significant.  His last heart cath was in 2017 which did show a patent SVG to OM and occluded SVG to right coronary.  He is anticipating restarting an exercise program and working with a psychologist, educational.  02/24/2021  Jeremiah [name] is seen today in follow-up.  Overall he says he is feeling reasonably well.  He has had some shortness of breath.  He is scheduled for a sleep study.  We discussed this because of the previously mentioned issues and that is actually scheduled tonight.  He also has a history of heart failure with diabetes which is not at target A1c of 8.3.  Lipids recently at assess her total cholesterol 106, HDL 33, LDL 49 and triglycerides 134.  07/18/2021  Jeremiah [name] is seen today for routine follow-up.  He seems to be doing well on the Jardiance  at 25 mg daily.  Hemoglobin A1c is now come down to 7.3.  Blood pressure is well controlled.  EKG is unchanged showing sinus rhythm with some lateral T wave changes.  He has noted some lower blood pressures at home.  He says around the mid 90s over 36s.  Blood pressure here was elevated 130/90 however more typical readings are between 90 and 100 systolic.  He says he is asymptomatic for this.  He is on very little medicine.  1 option would be to consider backing off on the bisoprolol .  Cholesterol is very well controlled as of February total 106, HDL 33, LDL 49 triglycerides 134.  I do not believe it is too low.  Would recommend continue his current regimen.  02/02/2022  Jeremiah [name] is seen today in follow-up.  Overall he says he is doing very well.  Weight has been fairly stable.  Blood pressure is well controlled.  He has been undergoing some rehab for a frozen shoulder.  He denies any worsening shortness of breath.  Recent labs showed total cholesterol 153, HDL 33 triglycerides 291 and LDL 73.  A1c 7.3%.  EKG shows a sinus rhythm with some inferolateral T wave changes (unchanged from prior EKG in 2022).  09/07/2022  Jeremiah [name] is seen today in follow-up.  Overall  he seems to be doing pretty well.  He is actually lost quite a bit of weight recently.  He is down to 215 pounds but was as high as 245 back in March of this past year.  He has made some dietary changes.  He is also had some issues with his GI system and is having an upcoming endoscopy.  Blood pressure is well controlled.  He denies any chest pain.  He did have a recent syncopal episode.  He said he was working  on his house and had been not sleeping well and thought he was dehydrated and simply passed out.  It does not sound like there was a clear prodrome prior to that but if he feels it was likely related to exhaustion.  He has never passed out before.  He has not had any subsequent episodes.  12/27/2022  Jeremiah [name] returns today for follow-up.  Recently he is followed up with Dr. Darlean.  Has been having issues with reflux and had EGD and colonoscopy.  He was noted to have I believe 24 lesions of the esophagus, likely Barrett's esophagus.  He has had vomiting and is scheduled for a gastric emptying study.  Despite this, he is also made significant dietary changes and reports in the past 3 months he is lost about 35 pounds.  It was noted that he is markedly hypotensive today with blood pressure 82/46.  He was also seen recently as mentioned with Dr. Darlean and had a CT scan of the chest.  Besides emphysema this demonstrated aortic valve calcification as well as pulmonary artery dilatation suggestive of pulmonary hypertension.  He has been short of breath but uses home oxygen .  He also tells me that he has an upcoming visit for periodontal surgery with Dr. Bari.  02/18/2023  Jeremiah [name] is seen today as an add-on visit.  Unfortunately he was just hospitalized over the past week with dehydration, nausea vomiting and diarrhea as well as abdominal pain.  He was seen in [facility] ER and stabilized and then presented to his PCP was sent to [facility].  There he was admitted and found to have norovirus gastroenteritis in addition to what  was described as an ischemic colitis.  His medications were held including his diuretics and blood pressure medicines to allow higher blood pressure.  I had recently cut back on his blood pressure medicine in January, stopping his losartan .  Since discharge she has had some persistent diarrhea.  He was noted to be somewhat anemic with a hemoglobin of 11.4.  His echo was read by myself and surprisingly indicated that he has moderate to severe mitral stenosis with elevated left atrial pressure.  This is likely contributing to some of his symptoms of shortness of breath.  Today he says he feels quite fatigued and is very symptomatic.  He is lost a significant amount of weight down another 15 pounds since I saw him in January.  In addition to the mitral stenosis, his EKG today shows atrial fibrillation.  This is a new diagnosis for him.  It does not appear that he had an EKG at [facility] that I could find but did have an EKG at [facility] which showed sinus rhythm with marked sinus arrhythmia although I would say it is questionably A-fib.  03/28/2023  Jeremiah [name] returns today for follow-up.  Unfortunately he has been hospitalized at least twice since I last saw him at [facility].  He has had issues with recurrent diarrhea and then unfortunately GI bleeding.  He was found to have mesenteric ischemia and underwent left brachial artery cutdown and mesenteric artery and celiac artery stenting.  Fortunately, he was noted to be in sinus rhythm when he presented although we had been working him up for possible TEE cardioversion.  Ultimately cardiology was consulted and they recommended a TEE to further evaluate his mitral valve disease.  Eliquis  was held but after stenting ultimately aspirin  and Eliquis  were restarted.  He is TEE demonstrated normal LVEF with heavily calcified mitral valve that was thought  to be degenerative with mild MR and a mean gradient of 5.8 at 65 bpm consistent with severe mitral stenosis with possibly low gradient.   There was also diffuse calcification of the aortic valve which planimeter to 1.2 cm with a peak velocity of 3.2 m/s thought to be moderately stenotic.  He required 8 units of packed red blood cells and after discharge remained anemic with a hemoglobin of 7.8.  He remains on oxygen  and was recently started on iron .  We discussed today about possible management options.  I do feel that mitral stenosis is likely a major risk factor for worsening shortness of breath as well as development of recurrent atrial fibrillation and stroke.  They had discussed the possibility of a Watchman device with him however with his peripheral arterial disease and mitral stenosis I think this would be a suboptimal procedure.  Ideally 1 would consider mitral valve replacement surgery with left atrial appendage closure surgically, and possibly consider aortic valve intervention.  However his comorbidities, recent GI bleeding and mesenteric ischemia may make him a prohibitive surgical candidate.  Additionally he has had prior CABG and this would be a redo sternotomy.  06/26/2023  Jeremiah [name] returns today for follow-up.  Again he was hospitalized at this time at [facility] for severe anemia and acute on chronic heart failure.  I did see him as I was on service in the hospital.  We participated in a diuresis.  Workup revealed severe anemia.  He was given IV iron .  His blood thinners have been stopped.  Subsequently after discharge and substantial diuresis of 15 pounds, he was discharged on 80 mg Lasix  daily and Aldactone .  Since follow-up his weight has remained stable or is actually a few pounds lighter.  He recently saw Dr. Darlean with pulmonary who made an adjustment changing his metoprolol  over to bisoprolol .  He has noted no change in his symptoms with this.  He was also seen at [facility] for capsule endoscopy.  This suggested that there was some small bowel AVM however options to try to alleviate bleeding from this are limited.  They had  suggested possibly a referral to [facility] to see about a balloon procedure.  Today he is in good spirits.  He reports his symptoms are pretty stable.  He has had some improvement in nasal allergy symptoms with Flonase  and is requesting a refill for that.  He is scheduled to see hematology tomorrow and my partners in October and November to address the Watchman device and possible mitral valve intervention.  11/11/2023  Jeremiah [name] is seen today in follow-up.  Unfortunately last few months have been quite difficult for him.  He had redeveloped GI bleeding and became quite anemic with hemoglobin down into the sixes.  He was again transfused.  He has been established with oncology and is undergoing therapies there to help improve his anemia.  He apparently was found to have bleeding from a small branch of the SMA based on capsule endoscopy.  This is not something that can be apparently easily treated however there is some ability to treat it but requires apparently specialized equipment that could be only at [place] or in [place].  Because of ongoing bleeding risk, he has not been a candidate for anticoagulation.  He has been having significant pain in both legs particularly when walking.  Dopplers in October 2024 shows severe stenosis of the right superficial femoral artery and severe stenosis of the above-the-knee popliteal artery on the left.  He is  frustrated that this continues however I discussed with Dr. Court today and percutaneous intervention for this is not possible to tolerate dual antiplatelet therapy for about 3 months.  Currently he is on aspirin  low-dose however would need to be on Plavix  as well.  Of note his blood pressure is quite low today at 80/49.  I wonder if this may be related to recent bleeding and or dehydration.  02/11/2024  Jeremiah [name] returns today for follow-up.  Overall he says he is feeling pretty well.  He had a procedure done at [facility] to locate an area of GI bleeding which was cauterized.  Since  then it seems that his hemoglobin has remained stable if not improved.  He is working with Dr. Gatha in the cancer center and receiving IV iron .  His most recent hemoglobin was 11.9 on March 3.  He still reports what sounds like claudication symptoms.  He saw Dr. Court recently and it was recommended to wait on additional 6 months to make sure that his hemoglobin was stable before considering a peripheral intervention.  He also had a repeat echo last month.  This was reviewed by Dr. Wendel kidding LVEF 55 to 60% with severe left atrial enlargement and moderate to severe mitral stenosis, possibly somewhat worse.  06/16/2024  Jeremiah [name] returns today for follow-up.  So far has been fairly stable since I last saw him in March.  His hemoglobin also has been stable ranging between 12.5 and 13.5.  His iron  stores have been normal.  He has had no recurrent bleeding.  He does have symptoms of what sound like claudication.  He says he can walk a decent distance and was able to walk down the hallway here without stopping but noted when he walks a longer distance such as from the entrance of [employer] to the back of the building he has to stop because of pain in his legs.  Breathing is also limiting it note he has noted to not always use the oxygen  regularly when he exerts himself.  Today his SpO2 was at 88% off of oxygen  but quickly climbed up to 97%.  He also has been struggling with some trigger finger recently and other issues.  He had a repeat echo this year which showed worsening mitral stenosis however he has not been considered a candidate for therapy for that.  10/23/2024  Jeremiah [name] returns today for hospital follow-up.  He was admitted from 10/13/2024 to 10/15/2024 for acute on chronic diastolic heart failure.  Prior to this he underwent elective cardioversion on 11/11 which successfully converted him to sinus rhythm but afterwards he developed acute worsening shortness of breath and was found to be in congestive heart  failure.  He was noted to have flat elevated troponins in the low 100s and EKG which showed some mild ST depressions.  There was concern for possible ischemia but he had no anginal symptoms.  He was diuresed and ultimately felt much better.  I suspect that he was developing acute on chronic diastolic heart failure that triggered his A-fib and then when he converted he then was more symptomatic.  Clearly the atrial fibrillation is contributing to his decompensation.  This has been difficult to manage.  Fortunately he has been able to tolerate anticoagulation for the past 8 months with some improvement in his anemia.  He also had recent shoulder injury and plans to undergo an MRI to evaluate that.  He was evaluated recently for PAD by Dr. Pearline and is being monitored  closely to see if he might be a candidate for peripheral intervention.  He was placed on cilostazol  which may have worsened his heart failure and did not provide him any symptomatic benefit for PAD and it has been discontinued.  12/18/2024  Jeremiah [name] returns today for follow-up.  Overall he says he is doing very well.  He has had no bleeding issues.  He has however gained some weight.  He wants to get involved with more exercise.  He was advised to do this as well by vascular surgery for his claudication.  He is interested in a referral to the PrEP program.  We will place that today.  He denies any recurrent A-fib.  He was seen by EP in December and felt to be doing well with his pacemaker.  PMHx:  Past Medical History:  Diagnosis Date   A-fib (HCC)    Acute on chronic systolic (congestive) heart failure (HCC) 06/10/2017   Acute pulmonary edema (HCC)    Anemia    Aortic valve sclerosis 12/2018   Noted on ECHO   Arthritis    CAD (coronary artery disease)    a. 2006: CABG in 2006 with LIMA-LAD, SVG-OM1, and SVG-RPDA   Cardiomegaly 12/2018   Stable, noted on CXR   Carpal tunnel syndrome    Right   Chronic pain 03/21/2016   Colon cancer  (HCC) 2006   COPD (chronic obstructive pulmonary disease) (HCC)    Diabetes mellitus without complication (HCC)    DVT (deep venous thrombosis) (HCC)    Right   Essential hypertension 03/21/2016   GERD (gastroesophageal reflux disease)    History of blood transfusion    History of Clostridioides difficile infection    History of prostate cancer 03/21/2016   History of PSVT (paroxysmal supraventricular tachycardia)    HLD (hyperlipidemia)    HTN (hypertension)    Hx of CABG 2006   Hypercholesteremia 03/21/2016   Hypothyroidism    LAE (left atrial enlargement) 12/2018   Severe, Noted on ECHO   LVH (left ventricular hypertrophy) 12/2018   Mild, Noted on ECHO   Medication management 03/21/2016   Mitral annular calcification 12/2018   with mild MS, Noted on ECHO   Morbid obesity (HCC) 03/21/2016   Myocardial infarct (HCC)    OSA (obstructive sleep apnea) 03/21/2016   uses oxygen  at night time   Pain in right ankle and joints of right foot 03/21/2016   Paronychia 03/21/2016   Peripheral vascular disease    Phimosis    Pneumonia    Primary insomnia 03/21/2016   Prostate cancer (HCC) 2008   Pulmonary hypertension (HCC) 12/2018   Moderate, Noted on ECHO   Tobacco dependence 03/21/2016   Tricuspid regurgitation 12/2018   Mild, Noted on ECHO    Past Surgical History:  Procedure Laterality Date   ANKLE SURGERY Right 12/2013   CARDIAC CATHETERIZATION N/A 05/08/2016   Procedure: Left Heart Cath and Cors/Grafts Angiography;  Surgeon: Victory LELON Sharps, MD;  Location: [facility] INVASIVE CV LAB;  Service: Cardiovascular;  Laterality: N/A;   CARDIOVERSION N/A 10/13/2024   Procedure: CARDIOVERSION;  Surgeon: Floretta Mallard, MD;  Location: [facility] INVASIVE CV LAB;  Service: Cardiovascular;  Laterality: N/A;   CIRCUMCISION N/A 10/07/2019   Procedure: CIRCUMCISION ADULT;  Surgeon: Cam Morene LELON, MD;  Location: [facility] ORS;  Service: Urology;  Laterality: N/A;   CORONARY ARTERY BYPASS GRAFT  2006   x3    ELECTROPHYSIOLOGIC STUDY N/A 08/24/2016   Procedure: SVT Ablation;  Surgeon: [employer],  MD;  Location: [facility] INVASIVE CV LAB;  Service: Cardiovascular;  Laterality: N/A;   PACEMAKER IMPLANT N/A 08/23/2023   Procedure: PACEMAKER IMPLANT;  Surgeon: Inocencio Soyla Lunger, MD;  Location: [facility] IN[facility];  Service: Cardiovascular;  Laterality: N/A;   PROSTATE SURGERY  2008   PTCA     UMBILICAL HERNIA REPAIR N/A 05/26/2022   Procedure: PRIMARY REPAIR OF STRANGULATED UMBILICAL HERNIA WITH PARTIAL OMENTECTOMY;  Surgeon: Aron Shoulders, MD;  Location: [facility] OR;  Service: General;  Laterality: N/A;    FAMHx:  Family History  Problem Relation Age of Onset   Dementia Mother    Diabetes Sister     SOCHx:   reports that he quit smoking about 8 years ago. His smoking use included cigarettes. He started smoking about 68 years ago. He has a 45 pack-year smoking history. He has never used smokeless tobacco. He reports that he does not currently use alcohol. He reports that he does not use drugs.  ALLERGIES:  Allergies  Allergen Reactions   Shellfish Allergy Shortness Of Breath, Nausea And Vomiting and Rash    Scallops     Fluzone [Influenza Virus Vaccine] Nausea And Vomiting, Palpitations and Rash   Keflex  [Cephalexin ] Rash    Itching and rash on face.     ROS: Pertinent items noted in HPI and remainder of comprehensive ROS otherwise negative.  HOME MEDS: Current Outpatient Medications  Medication Sig Dispense Refill   apixaban  (ELIQUIS ) 5 MG TABS tablet Take 1 tablet (5 mg total) by mouth 2 (two) times daily. 60 tablet 6   ASPIRIN  81 PO Take 81 mg by mouth daily.     atorvastatin  (LIPITOR ) 80 MG tablet Take 80 mg by mouth daily.     benzonatate  (TESSALON ) 200 MG capsule TAKE 1 CAPSULE(200 MG) BY MOUTH THREE TIMES DAILY AS NEEDED 45 capsule 3   budesonide  (PULMICORT ) 0.5 MG/2ML nebulizer solution Take 2 mLs (0.5 mg total) by nebulization 2 (two) times daily. 120 mL 11   cetirizine  (ZYRTEC )  10 MG tablet Take 1 tablet (10 mg total) by mouth daily. 90 tablet 3   colchicine  0.6 MG tablet Take 0.6 mg by mouth daily as needed (Gout).     dexlansoprazole (DEXILANT) 60 MG capsule Take 60 mg by mouth daily.     dicyclomine  (BENTYL ) 10 MG capsule Take 10 mg by mouth 3 (three) times daily as needed for spasms.     ezetimibe  (ZETIA ) 10 MG tablet Take 1 tablet (10 mg total) by mouth daily. 90 tablet 3   ferrous sulfate  325 (65 FE) MG tablet Take 325 mg by mouth daily with breakfast.     fluticasone  (FLONASE ) 50 MCG/ACT nasal spray Place 1 spray into both nostrils daily. 16 g 2   formoterol  (PERFOROMIST ) 20 MCG/2ML nebulizer solution Take 2 mLs (20 mcg total) by nebulization 2 (two) times daily. 120 mL 6   furosemide  (LASIX ) 40 MG tablet Take 2 tablets (80 mg total) by mouth daily. 60 tablet 2   glimepiride (AMARYL) 2 MG tablet Take 4 mg by mouth every morning.     levothyroxine  (SYNTHROID , LEVOTHROID) 88 MCG tablet Take 88 mcg by mouth daily before breakfast. Take on an empty stomach     metFORMIN (GLUCOPHAGE-XR) 500 MG 24 hr tablet Take 1,000 mg by mouth daily.     methocarbamol (ROBAXIN) 500 MG tablet Take 500 mg by mouth 2 (two) times daily.     metoprolol  succinate (TOPROL  XL) 50 MG 24 hr tablet Take 1 tablet (  50 mg total) by mouth daily. Take with or immediately following a meal. 30 tablet 11   nitroGLYCERIN  (NITROSTAT ) 0.4 MG SL tablet Place 1 tablet (0.4 mg total) under the tongue every 5 (five) minutes as needed for chest pain. 25 tablet 2   omeprazole (PRILOSEC) 40 MG capsule Take 40 mg by mouth daily.     ondansetron  (ZOFRAN -ODT) 4 MG disintegrating tablet Take 4 mg by mouth every 8 (eight) hours as needed for vomiting or nausea.     Potassium Chloride  ER 20 MEQ TBCR Take 20 mEq by mouth daily.     revefenacin  (YUPELRI ) 175 MCG/3ML nebulizer solution Take 3 mLs (175 mcg total) by nebulization daily. 90 mL 5   spironolactone  (ALDACTONE ) 25 MG tablet Take 1 tablet (25 mg total) by mouth  daily. 30 tablet 2   traZODone (DESYREL) 50 MG tablet Take 50 mg by mouth at bedtime.     No current facility-administered medications for this visit.    LABS/IMAGING: No results found for this or any previous visit (from the past 48 hours).  No results found.  WEIGHTS: Wt Readings from Last 3 Encounters:  12/18/24 223 lb (101.2 kg)  12/08/24 222 lb 12.8 oz (101.1 kg)  12/02/24 223 lb 9.6 oz (101.4 kg)    VITALS: BP 110/72   Pulse 92   Ht 5' 8 (1.727 m)   Wt 223 lb (101.2 kg)   SpO2 95%   BMI 33.91 kg/m   EXAM: General appearance: alert, no distress, and pale Neck: no carotid bruit, no JVD, and thyroid  not enlarged, symmetric, no tenderness/mass/nodules Lungs: diminished breath sounds bilaterally Heart: regular rate and rhythm, S1, S2 normal, and systolic murmur: early systolic 3/6, blowing at apex Abdomen: soft, non-tender; bowel sounds normal; no masses,  no organomegaly Extremities: extremities normal, atraumatic, no cyanosis or edema Pulses: 2+ and symmetric Skin: Skin color, texture, turgor normal. No rashes or lesions Neurologic: Grossly normal Psych: Pleasant  EKG: Deferred  ASSESSMENT: Acute on chronic heart failure with preserved ejection fraction Paroxysmal atrial fibrillation -CHA2DS2-VASc score of 4, currently in sinus rhythm Moderate aortic stenosis and severe mitral stenosis Ischemic colitis with recent GI bleeding Recent SVT which was symptomatic-repeat cardiac catheterization (05/2016) shows patent grafts, status post ablation of AV nodal reentry tachycardia (08/2016) CAD status post three-vessel CABG in 2006 (LIMA to LAD, SVG to OM1, SVG to RPDA) in [place]  Patent bypass grafts by cath in 2015 Dyslipidemia on Lipitor  OSA-BiPAP recommended COPD Chronic systolic congestive heart failure-LVEF 45-50% -improved to 60 to 65% (12/2018)  Phimosis Syncope PAD with symptoms of claudication  PLAN: 1.   Mr. Romberger continues to do well.  Denies any  chest pain or significant shortness of breath.  He does get some shortness of breath with exertion but is encouraged to use oxygen  by his pulmonologist.  No bleeding issues.  His A-fib is quiescent.  He is followed closely by EP.  Pacemaker is working appropriately.  He is interested in more exercise and a referral to the prep program which I placed.  Plan follow-up in 6 months  Vinie KYM Maxcy, MD, [facility], FNLA, FACP  [place]  [facility]  Medical Director of the [facility]  [facility] Reduction [facility] Diplomate of the American Board of Clinical Lipidology Attending Cardiologist  Direct Dial: [phone]  Fax: [phone]  Website:  [email or web address] Vinie BROCKS Mavric Cortright 12/18/2024, 9:52 AM "

## 2024-12-18 NOTE — Patient Instructions (Signed)
 Medication Instructions:  None  *If you need a refill on your cardiac medications before your next appointment, please call your pharmacy*  Lab Work: None  If you have labs (blood work) drawn today and your tests are completely normal, you will receive your results only by: MyChart Message (if you have MyChart) OR A paper copy in the mail If you have any lab test that is abnormal or we need to change your treatment, we will call you to review the results.  Testing/Procedures: None   Follow-Up: At [facility], you and your health needs are our priority.  As part of our continuing mission to provide you with exceptional heart care, our providers are all part of one team.  This team includes your primary Cardiologist (physician) and Advanced Practice Providers or APPs (Physician Assistants and Nurse Practitioners) who all work together to provide you with the care you need, when you need it.  Your next appointment:   6 month(s)  Provider:   Vinie Maxcy, MD   We recommend signing up for the patient portal called MyChart.  Sign up information is provided on this After Visit Summary.  MyChart is used to connect with patients for Virtual Visits (Telemedicine).  Patients are able to view lab/test results, encounter notes, upcoming appointments, etc.  Non-urgent messages can be sent to your provider as well.   To learn more about what you can do with MyChart, go to [email or web address].   Other Instructions Referral placed for exercise program.

## 2024-12-30 NOTE — Progress Notes (Unsigned)
 "  Patient ID: Jeremiah [name], male   DOB: 02/19/1946, 79 y.o.   MRN: [id number]  Reason for Consult: No chief complaint on file.   Referred by Regino Slater, MD  Subjective:     HPI Jeremiah [name] is a 79 y.o. male with PAD and claudication.  He has known SFA disease and has previously been seen by Dr. Court and has been started on Pletal .  He does have A-fib and is on Eliquis .   I saw him about 3 months ago and he had stable claudication without rest pain.  Today he reports ***  Past Medical History:  Diagnosis Date   A-fib (HCC)    Acute on chronic systolic (congestive) heart failure (HCC) 06/10/2017   Acute pulmonary edema (HCC)    Anemia    Aortic valve sclerosis 12/2018   Noted on ECHO   Arthritis    CAD (coronary artery disease)    a. 2006: CABG in 2006 with LIMA-LAD, SVG-OM1, and SVG-RPDA   Cardiomegaly 12/2018   Stable, noted on CXR   Carpal tunnel syndrome    Right   Chronic pain 03/21/2016   Colon cancer (HCC) 2006   COPD (chronic obstructive pulmonary disease) (HCC)    Diabetes mellitus without complication (HCC)    DVT (deep venous thrombosis) (HCC)    Right   Essential hypertension 03/21/2016   GERD (gastroesophageal reflux disease)    History of blood transfusion    History of Clostridioides difficile infection    History of prostate cancer 03/21/2016   History of PSVT (paroxysmal supraventricular tachycardia)    HLD (hyperlipidemia)    HTN (hypertension)    Hx of CABG 2006   Hypercholesteremia 03/21/2016   Hypothyroidism    LAE (left atrial enlargement) 12/2018   Severe, Noted on ECHO   LVH (left ventricular hypertrophy) 12/2018   Mild, Noted on ECHO   Medication management 03/21/2016   Mitral annular calcification 12/2018   with mild MS, Noted on ECHO   Morbid obesity (HCC) 03/21/2016   Myocardial infarct (HCC)    OSA (obstructive sleep apnea) 03/21/2016   uses oxygen  at night time   Pain in right ankle and joints of right foot  03/21/2016   Paronychia 03/21/2016   Peripheral vascular disease    Phimosis    Pneumonia    Primary insomnia 03/21/2016   Prostate cancer (HCC) 2008   Pulmonary hypertension (HCC) 12/2018   Moderate, Noted on ECHO   Tobacco dependence 03/21/2016   Tricuspid regurgitation 12/2018   Mild, Noted on ECHO   Family History  Problem Relation Age of Onset   Dementia Mother    Diabetes Sister    Past Surgical History:  Procedure Laterality Date   ANKLE SURGERY Right 12/2013   CARDIAC CATHETERIZATION N/A 05/08/2016   Procedure: Left Heart Cath and Cors/Grafts Angiography;  Surgeon: Victory LELON Sharps, MD;  Location: [facility] INVASIVE CV LAB;  Service: Cardiovascular;  Laterality: N/A;   CARDIOVERSION N/A 10/13/2024   Procedure: CARDIOVERSION;  Surgeon: Floretta Mallard, MD;  Location: [facility] INVASIVE CV LAB;  Service: Cardiovascular;  Laterality: N/A;   CIRCUMCISION N/A 10/07/2019   Procedure: CIRCUMCISION ADULT;  Surgeon: Cam Morene LELON, MD;  Location: [facility] ORS;  Service: Urology;  Laterality: N/A;   CORONARY ARTERY BYPASS GRAFT  2006   x3   ELECTROPHYSIOLOGIC STUDY N/A 08/24/2016   Procedure: SVT Ablation;  Surgeon: Will Gladis Norton, MD;  Location: [facility] INVASIVE CV LAB;  Service: Cardiovascular;  Laterality: N/A;  PACEMAKER IMPLANT N/A 08/23/2023   Procedure: PACEMAKER IMPLANT;  Surgeon: Inocencio Soyla Lunger, MD;  Location: [facility] INVASIVE CV LAB;  Service: Cardiovascular;  Laterality: N/A;   PROSTATE SURGERY  2008   PTCA     UMBILICAL HERNIA REPAIR N/A 05/26/2022   Procedure: PRIMARY REPAIR OF STRANGULATED UMBILICAL HERNIA WITH PARTIAL OMENTECTOMY;  Surgeon: Aron Shoulders, MD;  Location: [facility] OR;  Service: General;  Laterality: N/A;    Short Social History:  Social History   Tobacco Use   Smoking status: Former    Current packs/day: 0.00    Average packs/day: 0.8 packs/day for 60.0 years (45.0 ttl pk-yrs)    Types: Cigarettes    Start date: 12/15/1956    Quit date: 12/15/2016    Years since  quitting: 8.0   Smokeless tobacco: Never   Tobacco comments:    Former smoker 08/27/24  Substance Use Topics   Alcohol use: Not Currently    Comment: very seldom    Allergies  Allergen Reactions   Shellfish Allergy Shortness Of Breath, Nausea And Vomiting and Rash    Scallops     Fluzone [Influenza Virus Vaccine] Nausea And Vomiting, Palpitations and Rash   Keflex  [Cephalexin ] Rash    Itching and rash on face.     Current Outpatient Medications  Medication Sig Dispense Refill   apixaban  (ELIQUIS ) 5 MG TABS tablet Take 1 tablet (5 mg total) by mouth 2 (two) times daily. 60 tablet 6   ASPIRIN  81 PO Take 81 mg by mouth daily.     atorvastatin  (LIPITOR ) 80 MG tablet Take 80 mg by mouth daily.     benzonatate  (TESSALON ) 200 MG capsule TAKE 1 CAPSULE(200 MG) BY MOUTH THREE TIMES DAILY AS NEEDED 45 capsule 3   budesonide  (PULMICORT ) 0.5 MG/2ML nebulizer solution Take 2 mLs (0.5 mg total) by nebulization 2 (two) times daily. 120 mL 11   cetirizine  (ZYRTEC ) 10 MG tablet Take 1 tablet (10 mg total) by mouth daily. 90 tablet 3   colchicine  0.6 MG tablet Take 0.6 mg by mouth daily as needed (Gout).     dexlansoprazole (DEXILANT) 60 MG capsule Take 60 mg by mouth daily.     dicyclomine  (BENTYL ) 10 MG capsule Take 10 mg by mouth 3 (three) times daily as needed for spasms.     ezetimibe  (ZETIA ) 10 MG tablet Take 1 tablet (10 mg total) by mouth daily. 90 tablet 3   ferrous sulfate  325 (65 FE) MG tablet Take 325 mg by mouth daily with breakfast.     fluticasone  (FLONASE ) 50 MCG/ACT nasal spray Place 1 spray into both nostrils daily. 16 g 2   formoterol  (PERFOROMIST ) 20 MCG/2ML nebulizer solution Take 2 mLs (20 mcg total) by nebulization 2 (two) times daily. 120 mL 6   furosemide  (LASIX ) 40 MG tablet Take 2 tablets (80 mg total) by mouth daily. 60 tablet 2   glimepiride (AMARYL) 2 MG tablet Take 4 mg by mouth every morning.     levothyroxine  (SYNTHROID , LEVOTHROID) 88 MCG tablet Take 88 mcg by  mouth daily before breakfast. Take on an empty stomach     metFORMIN (GLUCOPHAGE-XR) 500 MG 24 hr tablet Take 1,000 mg by mouth daily.     methocarbamol (ROBAXIN) 500 MG tablet Take 500 mg by mouth 2 (two) times daily.     metoprolol  succinate (TOPROL  XL) 50 MG 24 hr tablet Take 1 tablet (50 mg total) by mouth daily. Take with or immediately following a meal. 30 tablet 11  nitroGLYCERIN  (NITROSTAT ) 0.4 MG SL tablet Place 1 tablet (0.4 mg total) under the tongue every 5 (five) minutes as needed for chest pain. 25 tablet 2   omeprazole (PRILOSEC) 40 MG capsule Take 40 mg by mouth daily.     ondansetron  (ZOFRAN -ODT) 4 MG disintegrating tablet Take 4 mg by mouth every 8 (eight) hours as needed for vomiting or nausea.     Potassium Chloride  ER 20 MEQ TBCR Take 20 mEq by mouth daily.     revefenacin  (YUPELRI ) 175 MCG/3ML nebulizer solution Take 3 mLs (175 mcg total) by nebulization daily. 90 mL 5   spironolactone  (ALDACTONE ) 25 MG tablet Take 1 tablet (25 mg total) by mouth daily. 30 tablet 2   traZODone (DESYREL) 50 MG tablet Take 50 mg by mouth at bedtime.     No current facility-administered medications for this visit.    REVIEW OF SYSTEMS  All other systems were reviewed and are negative     Objective:  Objective   There were no vitals filed for this visit.  There is no height or weight on file to calculate BMI.  Physical Exam General: no acute distress Cardiac: hemodynamically stable Extremities: Mild edema bilaterally, no cyanosis or wounds  Data: ABI ***      Assessment/Plan:   Jeremiah [name] is a 79 y.o. male with PAD and stable claudication.    ***   Norman GORMAN Serve MD [facility]  "

## 2025-01-01 ENCOUNTER — Ambulatory Visit (HOSPITAL_COMMUNITY)

## 2025-01-01 ENCOUNTER — Ambulatory Visit: Admitting: Vascular Surgery

## 2025-01-08 ENCOUNTER — Ambulatory Visit (HOSPITAL_COMMUNITY)

## 2025-02-12 ENCOUNTER — Ambulatory Visit (HOSPITAL_COMMUNITY)

## 2025-02-12 ENCOUNTER — Ambulatory Visit: Admitting: Vascular Surgery

## 2025-05-07 ENCOUNTER — Ambulatory Visit: Admitting: Internal Medicine
